# Patient Record
Sex: Male | Born: 1940
Health system: Southern US, Community
[De-identification: ages and names within clinical notes are randomized; demographics above are authoritative.]

## PROBLEM LIST (undated history)

## (undated) DIAGNOSIS — J189 Pneumonia, unspecified organism: Secondary | ICD-10-CM

## (undated) DIAGNOSIS — I219 Acute myocardial infarction, unspecified: Secondary | ICD-10-CM

## (undated) DIAGNOSIS — K635 Polyp of colon: Secondary | ICD-10-CM

## (undated) DIAGNOSIS — D649 Anemia, unspecified: Secondary | ICD-10-CM

## (undated) DIAGNOSIS — C4370 Malignant melanoma of unspecified lower limb, including hip: Secondary | ICD-10-CM

## (undated) DIAGNOSIS — I5022 Chronic systolic (congestive) heart failure: Secondary | ICD-10-CM

## (undated) DIAGNOSIS — K8020 Calculus of gallbladder without cholecystitis without obstruction: Secondary | ICD-10-CM

## (undated) DIAGNOSIS — H53451 Other localized visual field defect, right eye: Secondary | ICD-10-CM

## (undated) DIAGNOSIS — R3911 Hesitancy of micturition: Secondary | ICD-10-CM

## (undated) DIAGNOSIS — I4819 Other persistent atrial fibrillation: Secondary | ICD-10-CM

## (undated) DIAGNOSIS — D126 Benign neoplasm of colon, unspecified: Secondary | ICD-10-CM

## (undated) DIAGNOSIS — E785 Hyperlipidemia, unspecified: Secondary | ICD-10-CM

## (undated) DIAGNOSIS — K5790 Diverticulosis of intestine, part unspecified, without perforation or abscess without bleeding: Secondary | ICD-10-CM

## (undated) DIAGNOSIS — N401 Enlarged prostate with lower urinary tract symptoms: Secondary | ICD-10-CM

## (undated) DIAGNOSIS — M1990 Unspecified osteoarthritis, unspecified site: Secondary | ICD-10-CM

## (undated) DIAGNOSIS — I499 Cardiac arrhythmia, unspecified: Secondary | ICD-10-CM

## (undated) DIAGNOSIS — I7090 Unspecified atherosclerosis: Secondary | ICD-10-CM

## (undated) DIAGNOSIS — H269 Unspecified cataract: Secondary | ICD-10-CM

## (undated) DIAGNOSIS — G4733 Obstructive sleep apnea (adult) (pediatric): Secondary | ICD-10-CM

## (undated) DIAGNOSIS — K219 Gastro-esophageal reflux disease without esophagitis: Secondary | ICD-10-CM

## (undated) DIAGNOSIS — H409 Unspecified glaucoma: Secondary | ICD-10-CM

## (undated) DIAGNOSIS — C6990 Malignant neoplasm of unspecified site of unspecified eye: Secondary | ICD-10-CM

## (undated) DIAGNOSIS — F329 Major depressive disorder, single episode, unspecified: Secondary | ICD-10-CM

## (undated) DIAGNOSIS — I10 Essential (primary) hypertension: Secondary | ICD-10-CM

## (undated) DIAGNOSIS — F32A Depression, unspecified: Secondary | ICD-10-CM

## (undated) DIAGNOSIS — Z95810 Presence of automatic (implantable) cardiac defibrillator: Secondary | ICD-10-CM

## (undated) HISTORY — PX: BACK SURGERY: SHX140

## (undated) HISTORY — DX: Polyp of colon: K63.5

## (undated) HISTORY — DX: Unspecified atherosclerosis: I70.90

## (undated) HISTORY — DX: Diverticulosis of intestine, part unspecified, without perforation or abscess without bleeding: K57.90

## (undated) HISTORY — PX: JOINT REPLACEMENT: SHX530

## (undated) HISTORY — DX: Obstructive sleep apnea (adult) (pediatric): G47.33

## (undated) HISTORY — DX: Hyperlipidemia, unspecified: E78.5

## (undated) HISTORY — PX: REFRACTIVE SURGERY: SHX103

## (undated) HISTORY — PX: SURGERY SCROTAL / TESTICULAR: SUR1316

## (undated) HISTORY — PX: CATARACT EXTRACTION: SUR2

## (undated) HISTORY — DX: Calculus of gallbladder without cholecystitis without obstruction: K80.20

## (undated) HISTORY — PX: GLAUCOMA SURGERY: SHX656

## (undated) HISTORY — DX: Essential (primary) hypertension: I10

## (undated) HISTORY — DX: Unspecified osteoarthritis, unspecified site: M19.90

## (undated) HISTORY — DX: Anemia, unspecified: D64.9

## (undated) HISTORY — DX: Other persistent atrial fibrillation: I48.19

## (undated) HISTORY — DX: Benign neoplasm of colon, unspecified: D12.6

---

## 1996-10-01 DIAGNOSIS — I219 Acute myocardial infarction, unspecified: Secondary | ICD-10-CM

## 1996-10-01 HISTORY — DX: Acute myocardial infarction, unspecified: I21.9

## 1996-10-01 HISTORY — PX: CARDIAC CATHETERIZATION: SHX172

## 1999-06-02 DIAGNOSIS — C6990 Malignant neoplasm of unspecified site of unspecified eye: Secondary | ICD-10-CM

## 1999-06-02 HISTORY — DX: Malignant neoplasm of unspecified site of unspecified eye: C69.90

## 2000-03-31 HISTORY — PX: THORACIC SPINE SURGERY: SHX802

## 2000-04-08 ENCOUNTER — Encounter: Payer: Self-pay | Admitting: Neurosurgery

## 2000-04-08 ENCOUNTER — Inpatient Hospital Stay (HOSPITAL_COMMUNITY): Admission: RE | Admit: 2000-04-08 | Discharge: 2000-04-11 | Payer: Self-pay | Admitting: Neurosurgery

## 2000-04-23 ENCOUNTER — Encounter: Admission: RE | Admit: 2000-04-23 | Discharge: 2000-07-22 | Payer: Self-pay | Admitting: Neurosurgery

## 2003-07-06 ENCOUNTER — Ambulatory Visit (HOSPITAL_COMMUNITY): Admission: RE | Admit: 2003-07-06 | Discharge: 2003-07-06 | Payer: Self-pay | Admitting: Unknown Physician Specialty

## 2003-07-06 ENCOUNTER — Encounter: Payer: Self-pay | Admitting: Unknown Physician Specialty

## 2004-01-04 ENCOUNTER — Ambulatory Visit (HOSPITAL_COMMUNITY): Admission: RE | Admit: 2004-01-04 | Discharge: 2004-01-04 | Payer: Self-pay | Admitting: Unknown Physician Specialty

## 2004-10-01 DIAGNOSIS — H53451 Other localized visual field defect, right eye: Secondary | ICD-10-CM

## 2004-10-01 HISTORY — DX: Other localized visual field defect, right eye: H53.451

## 2004-11-16 ENCOUNTER — Ambulatory Visit: Payer: Self-pay | Admitting: Family Medicine

## 2004-12-18 ENCOUNTER — Ambulatory Visit: Payer: Self-pay | Admitting: Internal Medicine

## 2004-12-25 ENCOUNTER — Ambulatory Visit: Payer: Self-pay | Admitting: Internal Medicine

## 2005-02-19 ENCOUNTER — Ambulatory Visit: Payer: Self-pay | Admitting: Family Medicine

## 2005-04-09 ENCOUNTER — Ambulatory Visit: Payer: Self-pay | Admitting: Family Medicine

## 2005-04-30 ENCOUNTER — Ambulatory Visit: Payer: Self-pay | Admitting: Family Medicine

## 2005-06-18 ENCOUNTER — Ambulatory Visit: Payer: Self-pay | Admitting: Family Medicine

## 2005-09-19 ENCOUNTER — Ambulatory Visit: Payer: Self-pay | Admitting: Family Medicine

## 2005-12-26 ENCOUNTER — Ambulatory Visit: Payer: Self-pay | Admitting: Family Medicine

## 2006-01-28 ENCOUNTER — Encounter: Admission: RE | Admit: 2006-01-28 | Discharge: 2006-01-28 | Payer: Self-pay | Admitting: Sports Medicine

## 2006-04-23 ENCOUNTER — Encounter: Admission: RE | Admit: 2006-04-23 | Discharge: 2006-04-23 | Payer: Self-pay | Admitting: Sports Medicine

## 2006-05-02 ENCOUNTER — Ambulatory Visit: Payer: Self-pay | Admitting: Family Medicine

## 2006-08-05 ENCOUNTER — Ambulatory Visit: Payer: Self-pay | Admitting: Family Medicine

## 2006-08-14 ENCOUNTER — Encounter: Admission: RE | Admit: 2006-08-14 | Discharge: 2006-08-14 | Payer: Self-pay | Admitting: Sports Medicine

## 2006-10-28 ENCOUNTER — Ambulatory Visit: Payer: Self-pay | Admitting: Family Medicine

## 2006-12-31 ENCOUNTER — Encounter: Admission: RE | Admit: 2006-12-31 | Discharge: 2006-12-31 | Payer: Self-pay | Admitting: Sports Medicine

## 2007-02-26 ENCOUNTER — Ambulatory Visit: Payer: Self-pay | Admitting: Family Medicine

## 2007-04-14 ENCOUNTER — Encounter: Admission: RE | Admit: 2007-04-14 | Discharge: 2007-04-14 | Payer: Self-pay | Admitting: Sports Medicine

## 2007-05-06 ENCOUNTER — Ambulatory Visit: Payer: Self-pay | Admitting: Cardiology

## 2007-05-14 ENCOUNTER — Ambulatory Visit: Payer: Self-pay

## 2007-05-22 ENCOUNTER — Ambulatory Visit: Payer: Self-pay

## 2007-05-22 ENCOUNTER — Encounter: Payer: Self-pay | Admitting: Cardiology

## 2007-06-02 HISTORY — PX: TOTAL HIP ARTHROPLASTY: SHX124

## 2007-06-23 ENCOUNTER — Inpatient Hospital Stay (HOSPITAL_COMMUNITY): Admission: RE | Admit: 2007-06-23 | Discharge: 2007-06-27 | Payer: Self-pay | Admitting: Orthopedic Surgery

## 2007-06-23 ENCOUNTER — Encounter (INDEPENDENT_AMBULATORY_CARE_PROVIDER_SITE_OTHER): Payer: Self-pay | Admitting: Orthopedic Surgery

## 2007-07-23 ENCOUNTER — Encounter: Admission: RE | Admit: 2007-07-23 | Discharge: 2007-09-29 | Payer: Self-pay | Admitting: Orthopedic Surgery

## 2007-07-29 ENCOUNTER — Ambulatory Visit: Payer: Self-pay | Admitting: Cardiology

## 2009-11-25 ENCOUNTER — Encounter (INDEPENDENT_AMBULATORY_CARE_PROVIDER_SITE_OTHER): Payer: Self-pay | Admitting: *Deleted

## 2009-12-08 ENCOUNTER — Encounter (INDEPENDENT_AMBULATORY_CARE_PROVIDER_SITE_OTHER): Payer: Self-pay | Admitting: *Deleted

## 2009-12-29 ENCOUNTER — Encounter (INDEPENDENT_AMBULATORY_CARE_PROVIDER_SITE_OTHER): Payer: Self-pay | Admitting: *Deleted

## 2010-01-02 ENCOUNTER — Ambulatory Visit: Payer: Self-pay | Admitting: Internal Medicine

## 2010-01-16 ENCOUNTER — Ambulatory Visit: Payer: Self-pay | Admitting: Internal Medicine

## 2010-01-21 ENCOUNTER — Encounter: Payer: Self-pay | Admitting: Internal Medicine

## 2010-10-31 NOTE — Letter (Signed)
Summary: [facility] Instructions  [facility]  [place], [place] [address]   Phone: [phone]  Fax: [phone]       Chad Brown    [birth year]    MRN: [id number]        Procedure Day /Date:  Monday     Arrival Time:  9 AM      Procedure Time: 10 AM     Location of Procedure:                    _X_  [place] (4th Floor)                      [place]   Starting 5 days prior to your procedure _Wednesday 4/13/11_ do not eat nuts, seeds, popcorn, corn, beans, peas,  salads, or any raw vegetables.  Do not take any fiber supplements (e.g. Metamucil, Citrucel, and Benefiber).  THE DAY BEFORE YOUR PROCEDURE         DATE: _4/17/11_  DAY: _Sunday_  1.  Drink clear liquids the entire day-NO SOLID FOOD  2.  Do not drink anything colored red or purple.  Avoid juices with pulp.  No orange juice.  3.  Drink at least 64 oz. (8 glasses) of fluid/clear liquids during the day to prevent dehydration and help the prep work efficiently.  CLEAR LIQUIDS INCLUDE: Water Jello Ice Popsicles Tea (sugar ok, no milk/cream) Powdered fruit flavored drinks Coffee (sugar ok, no milk/cream) Gatorade Juice: apple, white grape, white cranberry  Lemonade Clear bullion, consomm, broth Carbonated beverages (any kind) Strained chicken noodle soup Hard Candy                             4.  In the morning, mix first dose of MoviPrep solution:    Empty 1 Pouch A and 1 Pouch B into the disposable container    Add lukewarm drinking water to the top line of the container. Mix to dissolve    Refrigerate (mixed solution should be used within 24 hrs)  5.  Begin drinking the prep at 5:00 p.m. The MoviPrep container is divided by 4 marks.   Every 15 minutes drink the solution down to the next mark (approximately 8 oz) until the full liter is complete.   6.  Follow completed prep with 16 oz of clear liquid of your choice (Nothing  red or purple).  Continue to drink clear liquids until bedtime.  7.  Before going to bed, mix second dose of MoviPrep solution:    Empty 1 Pouch A and 1 Pouch B into the disposable container    Add lukewarm drinking water to the top line of the container. Mix to dissolve    Refrigerate  THE DAY OF YOUR PROCEDURE      DATE: _4/18/11_ DAY: Adelfa Koh  Beginning at 5 a.m. (5 hours before procedure):         1. Every 15 minutes, drink the solution down to the next mark (approx 8 oz) until the full liter is complete.  2. Follow completed prep with 16 oz. of clear liquid of your choice.    3. You may drink clear liquids until 8 AM (2 HOURS BEFORE PROCEDURE).   MEDICATION INSTRUCTIONS  Unless otherwise instructed, you should take regular prescription medications with a small sip of water   as early as possible the morning of  your procedure.    Additional medication instructions: donot take Microzide day of procedure.         OTHER INSTRUCTIONS  You will need a responsible adult at least 70 years of age to accompany you and drive you home.   This person must remain in the waiting room during your procedure.  Wear loose fitting clothing that is easily removed.  Leave jewelry and other valuables at home.  However, you may wish to bring a book to read or  an iPod/MP3 player to listen to music as you wait for your procedure to start.  Remove all body piercing jewelry and leave at home.  Total time from sign-in until discharge is approximately 2-3 hours.  You should go home directly after your procedure and rest.  You can resume normal activities the  day after your procedure.  The day of your procedure you should not:   Drive   Make legal decisions   Operate machinery   Drink alcohol   Return to work  You will receive specific instructions about eating, activities and medications before you leave.    The above instructions have been reviewed and explained to me by   Ulice Dash RN  January 02, 2010 9:20 AM    I fully understand and can verbalize these instructions _____________________________ Date _________

## 2010-10-31 NOTE — Letter (Signed)
Summary: Previsit letter  [facility]  [place], [place] [address]   Phone: [phone]  Fax: [phone]       12/08/2009 MRN: [id number]  Chad Brown [id number] [place], [place]  [address]  Dear Mr. [name],  Welcome to the Gastroenterology Division at [facility].    You are scheduled to see a nurse for your pre-procedure visit on 01-02-10 at 9:00a.m. on the 3rd floor at [facility], [place] [employer].  We ask that you try to arrive at our office 15 minutes prior to your appointment time to allow for check-in.  Your nurse visit will consist of discussing your medical and surgical history, your immediate family medical history, and your medications.    Please bring a complete list of all your medications or, if you prefer, bring the medication bottles and we will list them.  We will need to be aware of both prescribed and over the counter drugs.  We will need to know exact dosage information as well.  If you are on blood thinners (Coumadin, Plavix, Aggrenox, Ticlid, etc.) please call our office today/prior to your appointment, as we need to consult with your physician about holding your medication.   Please be prepared to read and sign documents such as consent forms, a financial agreement, and acknowledgement forms.  If necessary, and with your consent, a friend or relative is welcome to sit-in on the nurse visit with you.  Please bring your insurance card so that we may make a copy of it.  If your insurance requires a referral to see a specialist, please bring your referral form from your primary care physician.  No co-pay is required for this nurse visit.     If you cannot keep your appointment, please call [phone] to cancel or reschedule prior to your appointment date.  This allows [place] the opportunity to schedule an appointment for another patient in need of care.    Thank you for choosing [facility] for your medical needs.  We  appreciate the opportunity to care for you.  Please visit [place] at our website  to learn more about our practice.                     Sincerely.                                                                                                                   The Gastroenterology Division

## 2010-10-31 NOTE — Letter (Signed)
Summary: Colonoscopy Letter  [facility]  [place], [place] [address]   Phone: [phone]  Fax: [phone]      November 25, 2009 MRN: [id number]   Chad Brown [id number] [facility] [address], [place]  [address]   Dear Mr. ENEA,   According to your medical record, it is time for you to schedule a Colonoscopy. The American Cancer Society recommends this procedure as a method to detect early colon cancer. Patients with a family history of colon cancer, or a personal history of colon polyps or inflammatory bowel disease are at increased risk.  This letter has beeen generated based on the recommendations made at the time of your procedure. If you feel that in your particular situation this may no longer apply, please contact our office.  Please call our office at [phone] to schedule this appointment or to update your records at your earliest convenience.  Thank you for cooperating with [place] to provide you with the very best care possible.   Sincerely,  Gatha Mayer, M.D.  [facility] Gastroenterology Division [phone]

## 2010-10-31 NOTE — Letter (Signed)
Summary: Patient Notice- Polyp Results  [facility]  [address] [place], [place] [address]   Phone: [phone]  Fax: [phone]        January 21, 2010 MRN: [id number]    Chad Brown [place], [place]  [address]    Dear [name],  I am pleased to inform you that the colon polyp(s) removed during your recent colonoscopy was (were) found to be benign (no cancer detected) upon pathologic examination. (one was recovered and one removed but not recovered)  I recommend you have a repeat colonoscopy examination in 5 years to look for recurrent polyps, as having colon polyps increases your risk for having recurrent polyps or even colon cancer in the future.  Should you develop new or worsening symptoms of abdominal pain, bowel habit changes or bleeding from the rectum or bowels, please schedule an evaluation with either your primary care physician or with me. In addition to repeating colonoscopy, changing health habits may reduce your risk of having more colon polyps and possibly, colon cancer. You may lower your risk of future polyps and colon cancer by adopting healthy habits such as not smoking or using tobacco (if you do), being physically active, losing weight (if overweight), and eating a diet which includes fruits and vegetables and limits red meat.  Please call us if you are having persistent problems or have questions about your condition that have not been fully answered at this time.  Sincerely,  Gatha Mayer MD, [facility]  This letter has been electronically signed by your physician.  Appended Document: Patient Notice- Polyp Results letter mailed 4.25.11

## 2010-10-31 NOTE — Procedures (Signed)
Summary: Colonoscopy  Patient: Chad Brown Note: All result statuses are Final unless otherwise noted.  Tests: (1) Colonoscopy (COL)   COL Colonoscopy           [facility] [employer] [facility] [employer]     [name],   [address]           COLONOSCOPY PROCEDURE REPORT           PATIENT:  [name], [name]  MR#:  [id number]     BIRTHDATE:  1941/08/05, 69 yrs. old  GENDER:  male     ENDOSCOPIST:  Gatha Mayer, MD, [facility]           PROCEDURE DATE:  01/16/2010     PROCEDURE:  Colonoscopy with snare polypectomy     ASA CLASS:  Class II     INDICATIONS:  surveillance and high-risk screening, history of     pre-cancerous (adenomatous) colon polyps 5 mm adenoma removed 3/06           MEDICATIONS:   Fentanyl 50 mcg IV, Versed 8 mg IV           DESCRIPTION OF PROCEDURE:   After the risks benefits and     alternatives of the procedure were thoroughly explained, informed     consent was obtained.  Digital rectal exam was performed and     revealed an enlarged prostate and no rectal masses.  Smooth,     mildly enlarged prostate. The LB CF-[id number] endoscope was     introduced through the anus and advanced to the cecum, which was     identified by both the appendix and ileocecal valve, without     limitations.  The quality of the prep was good, using MoviPrep.     The instrument was then slowly withdrawn as the colon was fully     examined. Insertion: 4:58 minutes Withdrawal; 16:14 minutes     <<PROCEDUREIMAGES>>           FINDINGS:  Two polyps were found in the ascending colon. They were     7 - 8 mm in size. Polyps were snared without cautery. Retrieval     was successful for one but not the other.  Severe diverticulosis     was found in the sigmoid colon.  This was otherwise a normal     examination of the colon.   Retroflexed views in the rectum     revealed no abnormalities.    The scope was then withdrawn from     the patient and the procedure completed.           COMPLICATIONS:  None     ENDOSCOPIC IMPRESSION:     1) Two 7-8 mm polyps removed in the ascending colon, 1 not     recovered     2) Severe diverticulosis in the sigmoid colon     3) Otherwise normal examination, good prep           4) Personal history of 5 mm adenoma removal 11/2004           REPEAT EXAM:  In for Colonoscopy, pending biopsy results.           Gatha Mayer, MD, [facility]           CC:  The Patient     Dione Housekeeper, MD           n.  eSIGNED:   Gatha Mayer at 01/16/2010 11:21 AM           Glenna Durand, [id number]  Note: An exclamation mark (!) indicates a result that was not dispersed into the flowsheet. Document Creation Date: 01/16/2010 11:21 AM _______________________________________________________________________  (1) Order result status: Final Collection or observation date-time: 01/16/2010 11:11 Requested date-time:  Receipt date-time:  Reported date-time:  Referring Physician:   Ordering Physician: Silvano Rusk [phone]) Specimen Source:  Source: Tawanna Cooler Order Number: [id number] Lab site:   Appended Document: Colonoscopy: hyperplastic polyp, 1 not recovered, diverticulosis   Colonoscopy  Procedure date:  01/16/2010  Findings:       1) Two 7-8 mm polyps removed in the ascending colon, 1 not     recovered PATH = HYPERPLASTIC on recovered polyp     2) Severe diverticulosis in the sigmoid colon     3) Otherwise normal examination, good prep           4) Personal history of 5 mm adenoma removal 11/2004  Comments:      Repeat colonoscopy in 5 years.   Procedures Next Due Date:    Colonoscopy: 01/2015   Appended Document: Colonoscopy     Procedures Next Due Date:    Colonoscopy: 01/2015

## 2010-10-31 NOTE — Miscellaneous (Signed)
Summary: [facility]  Clinical Lists Changes  Medications: Added new medication of MOVIPREP 100 GM  SOLR (PEG-KCL-NACL-NASULF-NA ASC-C) As per prep instructions. - Signed Rx of MOVIPREP 100 GM  SOLR (PEG-KCL-NACL-NASULF-NA ASC-C) As per prep instructions.;  #1 x 0;  Signed;  Entered by: Ulice Dash RN;  Authorized by: Gatha Mayer MD, [facility];  Method used: Print then Give to Patient Observations: Added new observation of NKA: T (01/02/2010 8:22)    Prescriptions: MOVIPREP 100 GM  SOLR (PEG-KCL-NACL-NASULF-NA ASC-C) As per prep instructions.  #1 x 0   Entered by:   Ulice Dash RN   Authorized by:   Gatha Mayer MD, [facility]   Signed by:   Ulice Dash RN on 01/02/2010   Method used:   Print then Give to Patient   RxID:   [id number]

## 2011-02-13 NOTE — Consult Note (Signed)
NAMEMORDCHA, BURDETT NO.:  [id number]   MEDICAL RECORD NO.:  [id number]          PATIENT TYPE:  INP   LOCATION:  5031                         FACILITY:  [facility]   PHYSICIAN:  Lucina Mellow. Terance Hart, M.D.DATE OF BIRTH:  1940/12/28   DATE OF CONSULTATION:  06/25/2007  DATE OF DISCHARGE:                                 CONSULTATION   I was asked to see this gentleman who is postop hip replacement.  He has  had a history of bladder outlet obstructive symptoms.  Has been on  Flomax per his primary care physician, and despite that has had some  problems with weak flow and nocturia.  He denies any history of urinary  tract infections or retention other than possibly after one other  surgical procedure.  He had a Foley catheter postop, and it was removed  yesterday.  He failed a voiding trial.  He had a couple of in-and-out  catheterizations, and then a Foley catheter was reinserted this  afternoon and connected to closed drainage and urine has been clear.  He  has been continued on his Flomax.  At this point he is not terribly  ambulatory.  He is supposed to be transferred to the nursing care unit  at [facility] at the end of this week.   PAST MEDICAL HISTORY:  End-stage degenerative joint disease of his right  hip.  He has had personal history of coronary artery disease with an MI,  hypertension, and glaucoma.   FAMILY HISTORY:  There is a family history of cardiovascular disease and  diabetes.   MEDICATIONS:  Norvasc, Neurontin, Microzide, K-Dur, Flomax, 81 mg  aspirin, glucosamine, Darvocet, and Aleve.   ALLERGIES:  Allergies to drugs are denied.   He is married and quit smoking in 2002 and rarely drinks alcohol.   Currently denies any nausea, vomiting, indigestion, chest pain,  shortness of breath, diarrhea.  He is complaining about hip pain  postoperatively.   PHYSICAL EXAMINATION:  VITAL SIGNS:  Blood pressure is 136/66, pulse 94.  He is afebrile.  GENERAL:  He is alert and oriented.  SKIN:  Warm and dry.  EXTREMITIES:  He has a leg brace on his right leg.  LUNGS:  No respiratory distress.  ABDOMEN:  Soft, nontender.  No hepatosplenomegaly.  No hernias, no  inguinal nodes.  GENITORECTAL:  Penis, urethral meatus, scrotum, testicles, and  epididymis remarkable for Foley catheter in place.  Perineum is normal,  sphincter tone is normal, prostate is hard to reach, could not palpate  seminal vesicles.  He says he gets annual PSA's that have been normal.   IMPRESSION:  1. Bladder neck contracture of longstanding origin.  2. Acute urinary retention postoperatively.   At this point I will recommend to keep his Foley catheter in place for  the time being.  He needs to continue on his Flomax.  Hopefully he will  be able to void better once he gets more ambulatory.  It may be  reasonable for him to maintain his Foley catheter drainage in the next  few days and undergo voiding trials when he  is at his nursing home  facility.  I will follow this gentleman between now and his discharge.      Lucina Mellow. Terance Hart, M.D.  Electronically Signed     LJP/MEDQ  D:  06/25/2007  T:  06/26/2007  Job:  [id number]   cc:   Herbie Baltimore A. Noemi Chapel, M.D.

## 2011-02-13 NOTE — Discharge Summary (Signed)
NAMESUMMER, HAENEL NO.:  [id number]   MEDICAL RECORD NO.:  [id number]          PATIENT TYPE:  INP   LOCATION:  5031                         FACILITY:  [facility]   PHYSICIAN:  Audree Camel. Noemi Chapel, M.D. DATE OF BIRTH:  01/14/1941   DATE OF ADMISSION:  06/23/2007  DATE OF DISCHARGE:  06/25/2007                               DISCHARGE SUMMARY   ADMITTING DIAGNOSES:  1. End-stage degenerative joint disease, right hip.  2. History of myocardial infarction.  3. Hypertension.  4. Cataracts.  5. Glaucoma.   DISCHARGE DIAGNOSES:  1. End-stage degenerative joint disease, right hip, status post total      hip replacement.  2. Hyponatremia, resolved.  3. Hypokalemia, still symptomatic.  4. Postoperative blood loss anemia.  5. Long term use of anticoagulants for postoperative hip replacement.  6. Lumbosacral degenerative disk disease with right leg radiculopathy.  7. Urinary retention with bladder neck narrowing.  Urinary retention      is acute.  Bladder neck narrowing is chronic.  8. History of myocardial infarction.  9. Hypertension.  10.Cataracts.  11.Glaucoma.   HISTORY OF PRESENT ILLNESS:  The patient is 70 year old white male with  a history of end-stage DJD of his right hip.  He failed conservative  care including anti-inflammatories and intra-articular hip injections.  The risks, benefits and possible complications of a right total hip were  discussed with him and his wife.  They understood these and were without  question.   PROCEDURE IN-HOUSE:  On June 23, 2007, the patient underwent a  right total hip replacement.  A Foley catheter was inserted  preoperatively in the operating room under general anesthesia without  difficulty.  Postoperatively, he was admitted for pain control, DVT  prophylaxis and physical therapy.   On postop day 1, the patient was alert and oriented.  He did complain of  some numbness in his right leg, similar to his radicular pain  and  numbness that he has had in the past.  Upon repositioning, his numbness  improved.  The patient was afebrile.  His hemoglobin was 10.7.  His INR  was 1.1.  He was metabolically stable.  He had a small amount of  drainage from his surgical wound.  He was neurovascularly intact with a  2+ dorsalis pedis pulse and active EHL, FHL, posterior tib and anterior  tib.  He was given Milk of Magnesia.  Physical therapy was ordered, 50%  weightbearing.  Home health was ordered.  His PCA was discontinued and  his Foley was discontinued.  He was given a 500 mL bolus of normal  saline and his IV was saline locked.   On postop day 2, the patient was once again alert and oriented.  His  numbness was significantly improved.  No bowel movement from the Milk of  Magnesia, although passing gas.  His abdomen was soft with positive  bowel sounds, but was distended.  His hemoglobin was 10.4, white cell  count was 10.7.  His sodium was 134, potassium was 3.4 and his INR was  1.5.  He was afebrile.  He was 2+  max assist with physical therapy,  unable to get out of bed by himself without two assistants and max  ambulation was 5 feet.  He was given a Dulcolax suppository and had in-  and-out cath x2 with approximately 500 mL of urine expressed on each in-  and-out cath.  He was still unable to void on the third trial and a  Foley catheter was placed and Urology was consulted.  Urology doubled  his Flomax dose and recommended that his Foley catheter be left in and  that voiding trials be done once he has become more ambulatory at his  skilled nursing facility.   On postop day 3, the patient was improved.  He had a T-max of 101.1.  White cell count was declined at 9.7.  His hemoglobin was stable at  10.1.  He was metabolically stable today with no abnormalities.  He  still had not had a bowel movement.  Fleets Phospho-Soda was ordered and  administered with good results.  His dressing was changed.  He had a   small amount of serosanguineous drainage.  The surgical wounds were well  approximated and healed.  The wound above the surgical wounds where a  skin tag was removed was also well approximated.  He had no redness and  no excess drainage.  He had minimal swelling.  No ecchymosis.  He was  neurovascularly intact.  He ambulation improved on 06/26/2007 and was  able to ambulate 90 feet, but still with 2+ max assist to get out of the  bed or out of a chair.   On postop day 4 in the morning, the patient was alert and oriented.  He  had a T-max of 100.5 overnight.  Temperature on exam was 99.4.  His  white cell count continued to decline at 9.4.  His hemoglobin was stable  at 9.5.  His INR was rising slowing at 1.6. His sodium was normal at  140.  His potassium was low at 3.2.  Otherwise, he is metabolically  stable.  The urine culture from his urinary retention revealed no  growth.   He is being discharged to a skilled nursing facility, 75% weightbearing  on his right lower extremity.  He has been instructed that when he is in  bed or in a chair to keep a pillow between his legs.  He is on posterior  total hip precautions.  The knee immobilizer was giving him pressure  sores on the proximal aspect of his leg.  He will need his heels floated  to prevent pressure sores on his right heel.  He will need his dressing  changed every other day.  He will need physical therapy daily and  occupational therapy.  He will need his Coumadin monitored to maintain  his INR between 2.0 and 3.0.  He will be on Coumadin until July 21, 2007.   DISCHARGE MEDICATIONS:  1. OxyIR 5 mg one to two every four to six hours p.r.n. pain.  2. Tylenol 650 mg every four hours, scheduled.  3. Robaxin 500 mg one p.o. every four to six hours p.r.n. muscle      spasm.  4. Coumadin 10 mg p.o. daily.  He will need a recheck of his PT/INR      and to be adjusted by skilled nursing facility medical physicians.  5. Norvasc 10  mg daily.  6. Neurontin 100 mg b.i.d.  7. Neurontin 300 mg p.o. q.h.s.  8. Microzide 12.5 mg daily.  9. K-Dur 20 mEq daily.  10.Flomax 0.4 mg two tablets daily.  11.Aspirin 81 mg daily.  12.Cosopt one drop in right eye b.i.d.  13.Alphagan one drop in right eye b.i.d.  14.Lumigan one drop in right eye q.h.s.  15.Clear Eyes one drop in left eye b.i.d.  16.Colace 100 mg one p.o. b.i.d.  17.Senokot-S two tablets with dinner if no BM in two days.  His last      BM was 06/26/2007.   FOLLOWUP:  He will need to followup with Dr. Noemi Chapel on Monday, July 07, 2007, for x-rays and a wound check.  Our office number is [phone].  Please call to make an appointment.   Please all if increased redness, increased swelling, increased drainage  about his right hip wounds or a temperature greater than 101.5.   CONDITION ON DISCHARGE:  He is in stable condition, 75% weightbearing  and currently on a regular high-fiber diet.      Kirstin Shepperson, P.A.      Robert A. Noemi Chapel, M.D.  Electronically Signed    KS/MEDQ  D:  06/27/2007  T:  06/27/2007  Job:  [id number]

## 2011-02-13 NOTE — Op Note (Signed)
Chad Brown, Chad Brown NO.:  [id number]   MEDICAL RECORD NO.:  [id number]          PATIENT TYPE:  INP   LOCATION:  2854                         FACILITY:  [facility]   PHYSICIAN:  Audree Camel. Noemi Chapel, M.D. DATE OF BIRTH:  [birth year]   DATE OF PROCEDURE:  06/23/2007  DATE OF DISCHARGE:                               OPERATIVE REPORT   PREOPERATIVE DIAGNOSIS:  Right hip degenerative joint disease.   POSTOPERATIVE DIAGNOSIS:  Right hip degenerative joint disease.   PROCEDURE:  Right total hip replacement using S-ROM Press-Fit total hip  system with acetabulum, 50 mm ASR Press-Fit cup with femoral component  18 x 13 femoral stem with 36 +8 femoral neck with +0 x 45 mm ASR head  with 64F large sleeve.   SURGEON:  Audree Camel. Noemi Chapel, M.D.   ASSISTANT:  Matthew Saras, P.A.   ANESTHESIA:  General.   OPERATIVE TIME:  1 hour 30 minutes.   ESTIMATED BLOOD LOSS:  350 mL.   COMPLICATIONS:  None.   DESCRIPTION OF PROCEDURE:  The patient is brought to the operating room  on June 23, 2007, placed on the operative table in supine position.  After an adequate level of general anesthesia was obtained, he received  Ancef 2 grams IV preoperatively for prophylaxis.  He had a Foley  catheter placed under sterile conditions.  His right hip was examined  under anesthesia.  He had flexion to 80, extension to 0, internal  rotation of -20, external rotation of 30.  Leg lengths were  approximately equal.  After being placed under general anesthesia, he  was then turned in the right lateral decubitus position and secured on  the bed with Mark frame.  His right hip and leg was prepped using a  sterile DuraPrep and draped using sterile technique.  Originally,  through a 15 cm posterolateral greater trochanteric incision initial  exposure was made.  The underlying subcutaneous tissues were incised  along with skin incision.  The iliotibial band and gluteus maximus  fascia was incised  longitudinally revealing the sciatic nerve which was  carefully protected.  The short external rotators of the hip and hip  capsule were released off their femoral neck insertion intact.  The hip  was then posteriorly dislocated.  Femoral neck cut was then made 2 cm  above the lesser trochanter and the femoral head was removed.  A showed  significant grade 3 and 4 DJD.  Degenerative acetabular labrum was then  removed and carefully placed acetabular retractors were placed.  The  acetabulum showed grade 3 and 4 DJD as well.  Sequential acetabular  reaming was then carried out in the appropriate amount of anteversion,  abduction and inclination up to a 49 mm size and then a 50 mm acetabular  trial was placed and found to be an excellent fit.  It was then removed  and the actual 50 mm ASR cup was hammered into position in the  appropriate manner anteversion, abduction and inclination with an  excellent press fit.  The proximal femur was then exposed.  Sequential  reaming distally to a 13.5  size was performed and then an 71F large  calcar sleeve reaming was carried out and this gave an excellent fit as  well.  After this was done, then the femoral trial with the trial sleeve  was placed with a 36 +8 femoral neck offset and a +0 x 45 mm trial head.  The hip was then reduced, taken through a full range of motion, found to  be stable, leg lengths equal.  At this point, the femoral trials were  felt to be of excellent size, fit and stability.  They were then  removed.  The femoral canal was then irrigated with saline and then the  actual 71F sleeve component was hammered into position with an excellent  fit and then the femoral component, 18 x 13 femoral stem with a 36 +8  femoral neck was hammered into position.  He had 20 degrees of  anteversion with an excellent fit and then the +0 x 45 mm ASR head was  placed on the femoral neck and hammered into position with an excellent  Morse taper fit.   The hip was then reduced, taken through range of  motion, found to be stable up to 80 degrees of internal rotation in both  neutral and 30 degrees of adduction, also stable in abduction and  external rotation.  Leg lengths were found to be equalized as well.  At  this point, it is felt that all the components were of excellent size,  fit and stability.  After this was done, then the wound was further  irrigated.  The short external rotators of the hip and hip capsule were  reattached to their femoral neck insertion through two drill holes in  the greater trochanter.  The iliotibial band gluteus maximus fascia was  closed with a #1 Ethibond suture.  Subcutaneous tissues closed with 0  and 2-0 Vicryl, subcuticular layer closed with 4-0 Monocryl.  The  patient also had a skin tag just anterior to the hip incision and this  was removed at its base and sent for pathologic review, although it  looked completely benign.  At this point, sterile dressings and a knee  immobilizer was placed.  The patient was turned supine, checked for leg  lengths equal, rotation equal, pulses 2+ and symmetric.  He was then  awakened, extubated and taken to the recovery room in stable condition.  Needle and sponge counts correct x2 at the end of the case.      Robert A. Noemi Chapel, M.D.  Electronically Signed     RAW/MEDQ  D:  06/23/2007  T:  06/23/2007  Job:  [id number]

## 2011-02-13 NOTE — Assessment & Plan Note (Signed)
[place]                            CARDIOLOGY OFFICE NOTE   ARIV, GOTTE                     MRN:          [id number]  DATE:07/29/2007                            DOB:          [birth year]    [name] is in for a followup visit.  He is generally doing well.  He  underwent a hip replacement.  He had a long hospitalization due to a  number of things none of which were cardiac.  His myocardial perfusion  scan was unremarkable.  His initial presentation was with possible spasm  in 1998.  At that time he did not have critical coronary disease.  We  thought he might have some spasms so therefore he has been on Norvasc.  He has not been on a beta blocker as a result of this.  In general he is  feeling relatively well.  Today blood pressure 140/84, pulse 54.  Lung  fields are clear, and the cardiac rhythm is regular.   His most recent LDL cholesterol was elevated at 162, and he is scheduled  to see Dr. Edrick Oh for followup of this.   Since he is doing so well we will not see him for a year in followup.  I  have encouraged him to keep his appointment with  Dr. Edrick Oh so adjustments can be made.     [name]. Lia Foyer, MD, [facility]  Electronically Signed    TDS/MedQ  DD: 07/29/2007  DT: 07/30/2007  Job #: [id number]   cc:   Margarita Rana, M.D.

## 2011-02-13 NOTE — Assessment & Plan Note (Signed)
[place] OFFICE NOTE   Brown, Chad                     MRN:          [id number]  DATE:05/06/2007                            DOB:          1940/10/10    REFERRING PHYSICIAN:  Audree Camel. Noemi Chapel, M.D.   CHIEF COMPLAINT:  It has been nearly 10 years since I last saw Mr.  Chad Brown.   Our last office visit was in June 1998.  He had a moderate LVH with  relaxation abnormality and noncompliance with ejection fraction that was  normal at that time.  He was on an ACE inhibitor and had a cough.  He  was followed by Dr. Trellis Moment.  He has been scheduled for surgery by  Dr. Colon Flattery or bladder Noemi Chapel.  He is scheduled to have a total hip  replacement.  The patient underwent previous cardiac evaluation.  He had  LVH.  He had very mild posterolateral hypokinesis and a completely  normal coronary arteries at that time.  When he was in the hospital at  that time, he did have elevated cardiac enzymes with a CPK of 31 and a  troponin I of 13.1.  He was subsequently discharged on Norvasc and Imdur  and we questioned whether this might have been related to vasospasm.  He  has had no recurrent episodes of discomfort in the interim.  An  echocardiogram did reveal evidence of moderate left ventricular  hypertrophy at that time with an ejection fraction of 55% and very mild  mitral regurgitation.   In the interim, he has done relatively well.  He had back surgery by Dr.  Saintclair Halsted in 2001, but otherwise no significant problems.  He does have  hypertension and hypercholesterolemia, but has not had recurrent  episodes of chest pain and no exertional symptoms during the interim.   He has no known drug allergies.   MEDICATIONS:  1. Pravachol 80 mg daily.  2. Fish oil 360 mg daily.  3. Glucosamine 500 mg p.o. b.i.d.  4. Aleve 220 mg daily.  5. Aspirin 81 mg daily.  6. Norvasc 10 mg daily.  7. Neurontin 200 mg in the morning  and 300 mg in the p.m.  8. Microzide 12.5 mg daily.  9. K-Dur 20 mEq daily.  10.CoQ10 100 mg daily b.i.d.  11.Cosopt, right eye b.i.d.  12.Alphagan right eye b.i.d.  13.Lumigan right eye b.i.d.   PAST MEDICAL HISTORY:  Remarkable for:  1. Hypertension.  2. Hypercholesterolemia.   SOCIAL HISTORY:  The patient quit smoking.  He plays golf.  He is  married and has 3 children.  He quit smoking 6 years ago.   FAMILY HISTORY:  Father died at 74 of acute leukemia.  Mother died at 29  and had diabetes and prior open heart surgery.  He has 3 siblings who  are all alive in their 77s and they do have hypercholesterolemia.   REVIEW OF SYSTEMS:  Remarkable for dentures.  He had a cough previously  but not presently.  He does have some reflux at times.  He has nocturia  at times.  His weight varies from 165 to 235.   PHYSICAL EXAMINATION:  VITAL SIGNS:  Currently today blood pressure is  139/79 in the right arm and 138/76 in the left arm.  The weight is 223  pounds.  The pulse is 60.  HEENT:  Reveals extraocular muscles intact.  His pupils move  symmetrically.  NECK:  Supple.  There are no carotid bruits noted.  LUNGS:  Fields are clear to auscultation and percussion.  CARDIAC:  Rhythm is regular without a definite murmur.  ABDOMEN:  Soft without hepatosplenomegaly.  EXTREMITIES:  Reveal no edema.   EKG reveals normal sinus rhythm with left axis deviation and borderline  interventricular conduction delay.   IMPRESSION:  1. Prior non-ST elevation wall myocardial infarction with no      significant coronary obstruction.  2. History of hypertension.  3. Hypercholesterolemia.  4. History of left ventricular hypertrophy with repolarization      abnormalities.   PLAN:  The patient should be okay for surgery.  He has not had  intercurrent symptoms.  We will go ahead and do an adenosine Myoview  study to assess for myocardial ischemia.  Should this be stable, then he  would be stable for  surgery.     [name]. Lia Foyer, MD, [facility]  Electronically Signed    TDS/MedQ  DD: 05/19/2007  DT: 05/19/2007  Job #: [id number]
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
## 2011-07-12 LAB — BASIC METABOLIC PANEL
BUN: 10
BUN: 12
BUN: 15
CO2: 31
CO2: 31
Calcium: 7.5 — ABNORMAL LOW
Chloride: 100
Chloride: 101
Creatinine, Ser: 0.87
Creatinine, Ser: 0.87
Creatinine, Ser: 0.91
GFR calc Af Amer: >60
GFR calc Af Amer: >60
GFR calc Af Amer: >60
GFR calc non Af Amer: >60
Glucose, Bld: 111 — ABNORMAL HIGH
Glucose, Bld: 113 — ABNORMAL HIGH
Glucose, Bld: 129 — ABNORMAL HIGH
Glucose, Bld: 137 — ABNORMAL HIGH
Potassium: 3.4 — ABNORMAL LOW
Sodium: 134 — ABNORMAL LOW
Sodium: 143

## 2011-07-12 LAB — URINALYSIS, ROUTINE W REFLEX MICROSCOPIC
Bilirubin Urine: NEGATIVE
Hgb urine dipstick: NEGATIVE
Ketones, ur: 40 — AB
Ketones, ur: NEGATIVE
Leukocytes, UA: NEGATIVE
Nitrite: NEGATIVE
Protein, ur: 30 — AB
Protein, ur: NEGATIVE
Urobilinogen, UA: 1
pH: 6.5

## 2011-07-12 LAB — CBC
Hemoglobin: 13.8
MCHC: 33.9
MCHC: 33.9
MCHC: 33.9
MCHC: 34.1
MCV: 85.7
MCV: 86.1
Platelets: 141 — ABNORMAL LOW
Platelets: 144 — ABNORMAL LOW
Platelets: 161
RBC: 3.26 — ABNORMAL LOW
RBC: 3.48 — ABNORMAL LOW
RDW: 15.6 — ABNORMAL HIGH
RDW: 16 — ABNORMAL HIGH
WBC: 10.7 — ABNORMAL HIGH
WBC: 8.9

## 2011-07-12 LAB — URINE MICROSCOPIC-ADD ON

## 2011-07-12 LAB — PROTIME-INR
INR: 1
INR: 1.1
INR: 1.5
Prothrombin Time: 18.5 — ABNORMAL HIGH
Prothrombin Time: 20.2 — ABNORMAL HIGH

## 2011-07-12 LAB — COMPREHENSIVE METABOLIC PANEL
AST: 21
Albumin: 3.9
CO2: 30
Calcium: 9.5
Chloride: 103
GFR calc non Af Amer: >60
Sodium: 143
Total Bilirubin: 0.6
Total Protein: 7.2

## 2011-07-12 LAB — DIFFERENTIAL
Monocytes Absolute: 0.8 — ABNORMAL HIGH
Neutro Abs: 5.1
Neutrophils Relative %: 58

## 2011-07-12 LAB — ABO/RH: ABO/RH(D): O POS

## 2011-07-12 LAB — URINE CULTURE
Colony Count: 40000
Special Requests: NEGATIVE

## 2011-07-12 LAB — APTT: aPTT: 32

## 2011-07-12 LAB — TYPE AND SCREEN: ABO/RH(D): O POS

## 2012-05-01 HISTORY — PX: INCISION AND DRAINAGE ABSCESS POSTERIOR CERVICALSPINE: SHX1793

## 2012-05-15 ENCOUNTER — Ambulatory Visit (INDEPENDENT_AMBULATORY_CARE_PROVIDER_SITE_OTHER): Payer: Medicare Other | Admitting: Surgery

## 2012-05-15 ENCOUNTER — Encounter (INDEPENDENT_AMBULATORY_CARE_PROVIDER_SITE_OTHER): Payer: Self-pay | Admitting: Surgery

## 2012-05-15 VITALS — BP 136/80 | HR 60 | Temp 98.6°F | Resp 16 | Ht 68.0 in | Wt 220.4 lb

## 2012-05-15 DIAGNOSIS — L089 Local infection of the skin and subcutaneous tissue, unspecified: Secondary | ICD-10-CM

## 2012-05-15 DIAGNOSIS — L723 Sebaceous cyst: Secondary | ICD-10-CM

## 2012-05-15 NOTE — Patient Instructions (Signed)
Neosporin/ dry gauze over wound daily until healed.  Follow up in 1-2 weeks.

## 2012-05-15 NOTE — Progress Notes (Signed)
Patient ID: ALGIE BIBB, male   DOB: Aug 28, 1941, 71 y.o.   MRN: [id number]  Chief Complaint  Patient presents with  . Other    absc on back of neck    HPI AFRAZ HULA is a 70 y.o. male.  Referred by Dr. Edrick Oh for evaluation of back abscess HPI This is a 71 year old male who had previously undergone incision and drainage of a infected sebaceous cyst by Dr. Redmond Pulling. He now has a new area in the midline of the upper back just below the base of the neck that has become swollen, erythematous, with some purulent drainage. It is quite tender. He denies any fever. He presents now to urgent office for evaluation. Past Medical History  Diagnosis Date  . Arthritis   . Hyperlipidemia   . Hypertension     Past Surgical History  Procedure Date  . Joint replacement     hip  . Eye surgery     glaucoma & cataract    Family History  Problem Relation Age of Onset  . Kidney disease Mother   . Heart disease Mother   . Diabetes Mother   . Cancer Father     leukemia    Social History History  Substance Use Topics  . Smoking status: Former Smoker    Quit date: 09/28/2000  . Smokeless tobacco: Never Used  . Alcohol Use: No    Not on File  Current Outpatient Prescriptions  Medication Sig Dispense Refill  . aspirin 81 MG tablet Take 81 mg by mouth daily.      [name] gabapentin (NEURONTIN) 100 MG capsule       . hydrochlorothiazide (MICROZIDE) 12.5 MG capsule       . meloxicam (MOBIC) 15 MG tablet       . NIASPAN 500 MG CR tablet       . potassium chloride SA (K-DUR,KLOR-CON) 20 MEQ tablet       . Tamsulosin HCl (FLOMAX) 0.4 MG CAPS         Review of Systems Review of Systems  Blood pressure 136/80, pulse 60, temperature 98.6 F (37 C), temperature source Temporal, resp. rate 16, height 5\' 8"  (1.727 m), weight 220 lb 6.4 oz (99.973 kg).  Physical Exam Physical Exam WDWN in NAD Skin - upper back near the base of the neck - midline - 3 cm area of erythema with 2 cm central  protrusion, fluctuance, and tenderness.  Data Reviewed None  Assessment    Infected sebaceous cyst - upper back    Plan    Incision and drainage of infected sebaceous cyst of the upper back. This area was prepped with Betadine and anesthetized with 1% lidocaine. I made a 2 cm transverse incision. Dissection was carried out on abscess. We evacuated a large amount of infected sebum. The tract to dissect some of the wall of the cyst away but there is too much inflammation. We debrided some of the cyst wall that we could see. The wound is packed with quarter-inch Nu Gauze. The patient will remove this and treat this area with Neosporin and dry gauze. Followup in 2-3 weeks. He may need further excision if the cyst reaccumulates.       Mamadou Breon K. 05/15/2012, 5:41 PM

## 2012-05-23 ENCOUNTER — Encounter (INDEPENDENT_AMBULATORY_CARE_PROVIDER_SITE_OTHER): Payer: Self-pay | Admitting: Surgery

## 2012-05-23 ENCOUNTER — Ambulatory Visit (INDEPENDENT_AMBULATORY_CARE_PROVIDER_SITE_OTHER): Payer: Medicare Other | Admitting: Surgery

## 2012-05-23 VITALS — BP 124/72 | HR 68 | Temp 97.2°F | Resp 12 | Ht 68.0 in | Wt 224.4 lb

## 2012-05-23 DIAGNOSIS — L723 Sebaceous cyst: Secondary | ICD-10-CM

## 2012-05-23 NOTE — Progress Notes (Signed)
  The infected sebaceous cyst is improved.  No more drainage.  I would like to reevaluate him in one month to see if the cyst has begun to reaccumulate.    Imogene Burn. Georgette Dover, MD, [facility]  05/23/2012 10:53 AM

## 2012-06-19 ENCOUNTER — Encounter (INDEPENDENT_AMBULATORY_CARE_PROVIDER_SITE_OTHER): Payer: Self-pay | Admitting: General Surgery

## 2012-06-19 ENCOUNTER — Ambulatory Visit (INDEPENDENT_AMBULATORY_CARE_PROVIDER_SITE_OTHER): Payer: Medicare Other | Admitting: General Surgery

## 2012-06-19 VITALS — BP 142/76 | HR 60 | Temp 98.5°F | Resp 20 | Ht 67.5 in | Wt 222.0 lb

## 2012-06-19 DIAGNOSIS — L723 Sebaceous cyst: Secondary | ICD-10-CM

## 2012-06-19 DIAGNOSIS — L089 Local infection of the skin and subcutaneous tissue, unspecified: Secondary | ICD-10-CM

## 2012-06-19 NOTE — Patient Instructions (Signed)
Apply triple antibiotic ointment daily and cover with gauze. Call if area worsens.

## 2012-06-20 NOTE — Progress Notes (Signed)
Subjective:     Patient ID: Chad Brown, male   DOB: [birth year], 71 y.o.   MRN: [id number]  HPI This is a 71 year old gentleman who recently had an infected posterior neck sebaceous cyst drained by Dr. Georgette Dover on August 15. He came back in on August 19 for recheck. He comes in today because of concerns of reinfection. They noticed some swelling and redness. He denies any fevers or chills. He denies any drainage from the area Review of Systems     Objective:   Physical Exam BP 142/76  Pulse 60  Temp 98.5 F (36.9 C) (Temporal)  Resp 20  Ht 5' 7.5" (1.715 m)  Wt 222 lb (100.699 kg)  BMI 34.26 kg/m2 Alert, no apparent distress Skin-base of posterior neck reveals a central punctum there is no surrounding cellulitis, induration or fluctuance. He has a circumferential blister around the punctum site    Assessment:     Healing sebaceous cyst    Plan:     There is no signs of current infection. I believe he is simply formed a blister around the skin edges. I do not see need for antibiotic. We discussed local wound care. He was instructed to keep his appt with Dr. Georgette Dover to discuss possible formal cyst excision  Leighton Ruff. Redmond Pulling, MD, FACS General, Bariatric, & Minimally Invasive Surgery [facility], [place]

## 2012-06-27 ENCOUNTER — Encounter (INDEPENDENT_AMBULATORY_CARE_PROVIDER_SITE_OTHER): Payer: Medicare Other | Admitting: Surgery

## 2012-07-03 ENCOUNTER — Ambulatory Visit (INDEPENDENT_AMBULATORY_CARE_PROVIDER_SITE_OTHER): Payer: Medicare Other | Admitting: Surgery

## 2012-07-03 ENCOUNTER — Encounter (INDEPENDENT_AMBULATORY_CARE_PROVIDER_SITE_OTHER): Payer: Self-pay | Admitting: Surgery

## 2012-07-03 VITALS — BP 130/82 | HR 60 | Temp 97.1°F | Resp 20 | Ht 68.0 in | Wt 222.0 lb

## 2012-07-03 DIAGNOSIS — L723 Sebaceous cyst: Secondary | ICD-10-CM

## 2012-07-03 NOTE — Progress Notes (Signed)
Status post incision and drainage of an infected sebaceous cyst on his posterior neck about 2 months ago. The wound seems to be healing well. There is a tiny scab over this area. No drainage. No surrounding erythema. I cannot really palpate a cyst. At this point I would not recommend excision. We will await a few more weeks and if cyst retiform so we will consider excision. I instructed the patient to call my nurse and for 4 weeks if this mass persists and we can schedule a time to excise it here in the office.  Imogene Burn. Georgette Dover, MD, [facility]  07/03/2012 9:33 AM

## 2012-07-03 NOTE — Patient Instructions (Signed)
If the cyst remains or if it continues to drain, then we should consider removing it.  Call my nurse Deneise Lever at [phone] to schedule an appointment to have this removed here in the office.

## 2012-11-18 DIAGNOSIS — H409 Unspecified glaucoma: Secondary | ICD-10-CM | POA: Insufficient documentation

## 2012-12-29 ENCOUNTER — Ambulatory Visit (INDEPENDENT_AMBULATORY_CARE_PROVIDER_SITE_OTHER): Payer: 59 | Admitting: Physician Assistant

## 2012-12-29 DIAGNOSIS — J209 Acute bronchitis, unspecified: Secondary | ICD-10-CM

## 2012-12-29 DIAGNOSIS — J45901 Unspecified asthma with (acute) exacerbation: Secondary | ICD-10-CM

## 2012-12-29 MED ORDER — AZITHROMYCIN 250 MG PO TABS: ORAL_TABLET | ORAL | Status: DC

## 2012-12-29 MED ORDER — ALBUTEROL SULFATE HFA 108 (90 BASE) MCG/ACT IN AERS: 2.0000 | INHALATION_SPRAY | Freq: Four times a day (QID) | RESPIRATORY_TRACT | Status: DC | PRN

## 2012-12-29 MED ORDER — HYDROCODONE-HOMATROPINE 5-1.5 MG/5ML PO SYRP: 5.0000 mL | ORAL_SOLUTION | Freq: Every evening | ORAL | Status: DC | PRN

## 2012-12-29 MED ORDER — PREDNISONE 10 MG PO TABS: 10.0000 mg | ORAL_TABLET | Freq: Two times a day (BID) | ORAL | Status: DC

## 2012-12-29 NOTE — Progress Notes (Signed)
  Subjective:    Patient ID: Chad Brown, male    DOB: [birth year], 72 y.o.   MRN: [id number]  HPI cough that prevents sleep, causing shortness of breath over the past 3 days    Review of Systems  Constitutional: Positive for fever and chills.  HENT: Positive for postnasal drip.   Respiratory: Positive for cough, choking, chest tightness, shortness of breath and wheezing.   All other systems reviewed and are negative.       Objective:   Physical Exam  Constitutional: He is oriented to person, place, and time. He appears well-developed and well-nourished.  HENT:  Head: Normocephalic and atraumatic.  Right Ear: External ear normal.  Left Ear: External ear normal.  Eyes: Conjunctivae and EOM are normal. Pupils are equal, round, and reactive to light.  Neck: Normal range of motion. Neck supple.  Cardiovascular: Normal rate, regular rhythm and normal heart sounds.   Pulmonary/Chest: Effort normal. No respiratory distress. He has wheezes.  Neurological: He is alert and oriented to person, place, and time.  Skin: Skin is warm and dry.  Psychiatric: He has a normal mood and affect. His behavior is normal. Judgment and thought content normal.          Assessment & Plan:  Asthma with acute exacerbation - Plan: albuterol (PROVENTIL HFA;VENTOLIN HFA) 108 (90 BASE) MCG/ACT inhaler, predniSONE (DELTASONE) 10 MG tablet, HYDROcodone-homatropine (HYCODAN) 5-1.5 MG/5ML syrup  Acute bronchitis - Plan: azithromycin (ZITHROMAX) 250 MG tablet, HYDROcodone-homatropine (HYCODAN) 5-1.5 MG/5ML syrup

## 2013-01-02 ENCOUNTER — Telehealth: Payer: Self-pay | Admitting: Physician Assistant

## 2013-01-02 ENCOUNTER — Other Ambulatory Visit: Payer: Self-pay | Admitting: Physician Assistant

## 2013-01-02 DIAGNOSIS — J209 Acute bronchitis, unspecified: Secondary | ICD-10-CM

## 2013-01-02 MED ORDER — AZITHROMYCIN 250 MG PO TABS: ORAL_TABLET | ORAL | Status: DC

## 2013-01-02 NOTE — Telephone Encounter (Signed)
Antibiotic authorized and sent to pharmacy

## 2013-01-05 NOTE — Telephone Encounter (Signed)
Pt was given abx Friday 01/02/13

## 2013-01-22 ENCOUNTER — Encounter: Payer: Self-pay | Admitting: *Deleted

## 2013-01-22 ENCOUNTER — Encounter: Payer: Self-pay | Admitting: Physician Assistant

## 2013-01-22 ENCOUNTER — Other Ambulatory Visit: Payer: Self-pay | Admitting: Cardiovascular Disease

## 2013-01-22 ENCOUNTER — Encounter: Payer: Self-pay | Admitting: Cardiovascular Disease

## 2013-01-22 ENCOUNTER — Ambulatory Visit (INDEPENDENT_AMBULATORY_CARE_PROVIDER_SITE_OTHER): Payer: Medicare Other | Admitting: Cardiovascular Disease

## 2013-01-22 ENCOUNTER — Inpatient Hospital Stay (HOSPITAL_COMMUNITY)
Admission: AD | Admit: 2013-01-22 | Discharge: 2013-01-27 | DRG: 308 | Disposition: A | Discharge status: Home / Self Care | Payer: Medicare Other | Source: Ambulatory Visit | Attending: Cardiovascular Disease | Admitting: Cardiovascular Disease

## 2013-01-22 ENCOUNTER — Inpatient Hospital Stay (HOSPITAL_COMMUNITY): Payer: Medicare Other

## 2013-01-22 VITALS — BP 141/102 | HR 125 | Wt 237.0 lb

## 2013-01-22 DIAGNOSIS — I10 Essential (primary) hypertension: Secondary | ICD-10-CM | POA: Insufficient documentation

## 2013-01-22 DIAGNOSIS — G8929 Other chronic pain: Secondary | ICD-10-CM | POA: Diagnosis present

## 2013-01-22 DIAGNOSIS — Z87891 Personal history of nicotine dependence: Secondary | ICD-10-CM

## 2013-01-22 DIAGNOSIS — E785 Hyperlipidemia, unspecified: Secondary | ICD-10-CM | POA: Insufficient documentation

## 2013-01-22 DIAGNOSIS — Z7901 Long term (current) use of anticoagulants: Secondary | ICD-10-CM

## 2013-01-22 DIAGNOSIS — Z79899 Other long term (current) drug therapy: Secondary | ICD-10-CM

## 2013-01-22 DIAGNOSIS — I509 Heart failure, unspecified: Secondary | ICD-10-CM | POA: Diagnosis present

## 2013-01-22 DIAGNOSIS — I4891 Unspecified atrial fibrillation: Principal | ICD-10-CM | POA: Diagnosis present

## 2013-01-22 DIAGNOSIS — E669 Obesity, unspecified: Secondary | ICD-10-CM | POA: Diagnosis present

## 2013-01-22 DIAGNOSIS — I426 Alcoholic cardiomyopathy: Secondary | ICD-10-CM | POA: Diagnosis present

## 2013-01-22 DIAGNOSIS — Z8249 Family history of ischemic heart disease and other diseases of the circulatory system: Secondary | ICD-10-CM

## 2013-01-22 DIAGNOSIS — Z806 Family history of leukemia: Secondary | ICD-10-CM

## 2013-01-22 DIAGNOSIS — M549 Dorsalgia, unspecified: Secondary | ICD-10-CM | POA: Diagnosis present

## 2013-01-22 DIAGNOSIS — Z6836 Body mass index (BMI) 36.0-36.9, adult: Secondary | ICD-10-CM

## 2013-01-22 DIAGNOSIS — I5021 Acute systolic (congestive) heart failure: Secondary | ICD-10-CM | POA: Diagnosis present

## 2013-01-22 DIAGNOSIS — M1990 Unspecified osteoarthritis, unspecified site: Secondary | ICD-10-CM | POA: Insufficient documentation

## 2013-01-22 DIAGNOSIS — I428 Other cardiomyopathies: Secondary | ICD-10-CM | POA: Diagnosis present

## 2013-01-22 DIAGNOSIS — I252 Old myocardial infarction: Secondary | ICD-10-CM

## 2013-01-22 DIAGNOSIS — I059 Rheumatic mitral valve disease, unspecified: Secondary | ICD-10-CM | POA: Diagnosis present

## 2013-01-22 HISTORY — DX: Malignant neoplasm of unspecified site of unspecified eye: C69.90

## 2013-01-22 HISTORY — DX: Acute myocardial infarction, unspecified: I21.9

## 2013-01-22 LAB — COMPREHENSIVE METABOLIC PANEL
ALT: 30 U/L (ref 0–53)
Alkaline Phosphatase: 70 U/L (ref 39–117)
BUN: 12 mg/dL (ref 6–23)
CO2: 31 mEq/L (ref 19–32)
Calcium: 9.1 mg/dL (ref 8.4–10.5)
GFR calc Af Amer: >90 mL/min (ref >90)
GFR calc non Af Amer: 80 mL/min — ABNORMAL LOW (ref >90)
Glucose, Bld: 120 mg/dL — ABNORMAL HIGH (ref 70–99)
Potassium: 3.5 mEq/L (ref 3.5–5.1)
Sodium: 142 mEq/L (ref 135–145)
Total Protein: 7.1 g/dL (ref 6.0–8.3)

## 2013-01-22 LAB — CBC WITH DIFFERENTIAL/PLATELET
Eosinophils Absolute: 0.2 10*3/uL (ref 0.0–0.7)
Eosinophils Relative: 3 % (ref 0–5)
Hemoglobin: 14 g/dL (ref 13.0–17.0)
Lymphocytes Relative: 21 % (ref 12–46)
Lymphs Abs: 1.3 10*3/uL (ref 0.7–4.0)
MCH: 28.8 pg (ref 26.0–34.0)
MCV: 85.2 fL (ref 78.0–100.0)
Monocytes Relative: 9 % (ref 3–12)
Platelets: 147 10*3/uL — ABNORMAL LOW (ref 150–400)
RBC: 4.86 MIL/uL (ref 4.22–5.81)
WBC: 6.3 10*3/uL (ref 4.0–10.5)

## 2013-01-22 LAB — APTT: aPTT: 31 seconds (ref 24–37)

## 2013-01-22 LAB — MAGNESIUM: Magnesium: 2.1 mg/dL (ref 1.5–2.5)

## 2013-01-22 MED ORDER — ACETAMINOPHEN 325 MG PO TABS: 650.0000 mg | ORAL_TABLET | ORAL | Status: DC | PRN

## 2013-01-22 MED ORDER — ONDANSETRON HCL 4 MG/2ML IJ SOLN: 4.0000 mg | Freq: Four times a day (QID) | INTRAMUSCULAR | Status: DC | PRN

## 2013-01-22 MED ORDER — RIVAROXABAN 20 MG PO TABS
20.0000 mg | ORAL_TABLET | Freq: Every day | ORAL | Status: DC
  Administered 2013-01-22 – 2013-01-26 (×5): 20 mg via ORAL
  Filled 2013-01-22 (×6): qty 1

## 2013-01-22 MED ORDER — SODIUM CHLORIDE 0.9 % IJ SOLN
3.0000 mL | Freq: Two times a day (BID) | INTRAMUSCULAR | Status: DC
  Administered 2013-01-23 – 2013-01-27 (×7): 3 mL via INTRAVENOUS

## 2013-01-22 MED ORDER — DIGOXIN 0.25 MG/ML IJ SOLN
0.2500 mg | Freq: Once | INTRAMUSCULAR | Status: AC
  Administered 2013-01-22: 0.25 mg via INTRAVENOUS
  Filled 2013-01-22: qty 1

## 2013-01-22 MED ORDER — SODIUM CHLORIDE 0.9 % IJ SOLN: 3.0000 mL | INTRAMUSCULAR | Status: DC | PRN

## 2013-01-22 MED ORDER — POTASSIUM CHLORIDE CRYS ER 20 MEQ PO TBCR
20.0000 meq | EXTENDED_RELEASE_TABLET | Freq: Two times a day (BID) | ORAL | Status: DC
  Administered 2013-01-22 – 2013-01-27 (×10): 20 meq via ORAL
  Filled 2013-01-22 (×13): qty 1

## 2013-01-22 MED ORDER — SODIUM CHLORIDE 0.9 % IV SOLN
250.0000 mL | INTRAVENOUS | Status: DC | PRN
  Administered 2013-01-26: 15:00:00 via INTRAVENOUS

## 2013-01-22 MED ORDER — ASPIRIN EC 81 MG PO TBEC
81.0000 mg | DELAYED_RELEASE_TABLET | Freq: Every day | ORAL | Status: DC
  Administered 2013-01-22 – 2013-01-27 (×6): 81 mg via ORAL
  Filled 2013-01-22 (×6): qty 1

## 2013-01-22 MED ORDER — FUROSEMIDE 10 MG/ML IJ SOLN
INTRAMUSCULAR | Status: AC
  Administered 2013-01-22: 20 mg via INTRAVENOUS
  Filled 2013-01-22: qty 4

## 2013-01-22 MED ORDER — DIGOXIN 0.25 MG/ML IJ SOLN
0.1250 mg | Freq: Once | INTRAMUSCULAR | Status: AC
  Administered 2013-01-23: 0.125 mg via INTRAVENOUS
  Filled 2013-01-22: qty 0.5

## 2013-01-22 MED ORDER — DIGOXIN 125 MCG PO TABS
0.1250 mg | ORAL_TABLET | Freq: Every day | ORAL | Status: DC
  Administered 2013-01-24 – 2013-01-25 (×2): 0.125 mg via ORAL
  Filled 2013-01-22 (×3): qty 1

## 2013-01-22 MED ORDER — DILTIAZEM HCL 25 MG/5ML IV SOLN
10.0000 mg | Freq: Once | INTRAVENOUS | Status: AC
  Administered 2013-01-22: 10 mg via INTRAVENOUS
  Filled 2013-01-22: qty 5

## 2013-01-22 MED ORDER — LEVALBUTEROL HCL 0.63 MG/3ML IN NEBU: 0.6300 mg | INHALATION_SOLUTION | Freq: Four times a day (QID) | RESPIRATORY_TRACT | Status: DC | PRN

## 2013-01-22 MED ORDER — DEXTROSE 5 % IV SOLN
5.0000 mg/h | INTRAVENOUS | Status: DC
  Administered 2013-01-22: 5 mg/h via INTRAVENOUS
  Administered 2013-01-22 – 2013-01-24 (×4): 15 mg/h via INTRAVENOUS
  Filled 2013-01-22 (×5): qty 100

## 2013-01-22 MED ORDER — FUROSEMIDE 10 MG/ML IJ SOLN: 20.0000 mg | Freq: Two times a day (BID) | INTRAMUSCULAR | Status: DC

## 2013-01-22 MED ORDER — FUROSEMIDE 10 MG/ML IJ SOLN
40.0000 mg | Freq: Two times a day (BID) | INTRAMUSCULAR | Status: DC
  Administered 2013-01-23 – 2013-01-24 (×3): 40 mg via INTRAVENOUS
  Filled 2013-01-22 (×5): qty 4

## 2013-01-22 MED ORDER — CARVEDILOL 6.25 MG PO TABS
6.2500 mg | ORAL_TABLET | Freq: Two times a day (BID) | ORAL | Status: DC
Start: 2013-01-22 — End: 2013-01-27
  Administered 2013-01-22 – 2013-01-27 (×10): 6.25 mg via ORAL
  Filled 2013-01-22 (×12): qty 1

## 2013-01-22 NOTE — Progress Notes (Signed)
Patient admitted to 3034.  Patient placed on monitor, HR=120-140's (AFIB/RVR per Centralized Telemetry).  Patient SOB.  2 L O2 Drew applied.   I spoke to Ignacia Bayley, NP, he entered new orders for Diltiazem IV.  Will continue to monitor.

## 2013-01-22 NOTE — Progress Notes (Signed)
Patient ID: Chad Brown, male   DOB: 10-10-1940, 72 y.o.   MRN: [id number] 72 yo patient of Dr Lia Foyer  Describes MI back in 94 Rx with medicine.  Has been sick since 3/29 with cough and dyspnea. Seen by multiple MD;s in [place] and given multiple antibiotics. No CXR done. Noted increased pulse PND and orthopnea No chest pain.  Takes diuretic for HTN  No fever. Last few days has been restless Cannot get comfortable or sleep due to dyspnea.  In office today was in rapid atrial fibrillation with signs of CHF  He indicates weight is up about 15 lbs over the last month with some abdominal distension.    ROS: Denies fever, malais, weight loss, blurry vision, decreased visual acuity, cough, sputum, SOB, hemoptysis, pleuritic pain, palpitaitons, heartburn, abdominal pain, melena, lower extremity edema, claudication, or rash.  All other systems reviewed and negative   General: Affect appropriate Dyspnic obese white male HEENT: normal Neck supple with no adenopathy JVP elevated  no bruits no thyromegaly Lungs crackles at base  no wheezing and good diaphragmatic motion Heart:  S1/S2 no murmur,rub, gallop or click PMI normal Abdomen: benighn, BS positve, no tenderness, no AAA no bruit.  No HSM or HJR Distal pulses intact with no bruits Plus 2 bilateral  edema Neuro non-focal Skin warm and dry No muscular weakness  Medications Current Outpatient Prescriptions  Medication Sig Dispense Refill  . albuterol (PROVENTIL HFA;VENTOLIN HFA) 108 (90 BASE) MCG/ACT inhaler Inhale 2 puffs into the lungs every 6 (six) hours as needed for wheezing.  1 Inhaler  0  . amLODipine (NORVASC) 10 MG tablet Take 10 mg by mouth daily.      [name] aspirin 81 MG tablet Take 81 mg by mouth daily.      [name] azithromycin (ZITHROMAX) 250 MG tablet Use as directed  6 tablet  0  . gabapentin (NEURONTIN) 100 MG capsule 2 tam am 3 tab pm      . hydrochlorothiazide (MICROZIDE) 12.5 MG capsule Take 12.5 mg by mouth daily.       [name]  loratadine (CLARITIN) 10 MG tablet Take 10 mg by mouth daily.      . meloxicam (MOBIC) 15 MG tablet Take 15 mg by mouth daily.       [name] NIASPAN 500 MG CR tablet Take 500 mg by mouth at bedtime.       . Pitavastatin Calcium (LIVALO) 2 MG TABS Take 2 mg by mouth daily.      . potassium chloride SA (K-DUR,KLOR-CON) 20 MEQ tablet 20 mEq daily.       . Tamsulosin HCl (FLOMAX) 0.4 MG CAPS 0.4 mg by Per post-pyloric tube route daily.        No current facility-administered medications for this visit.    Allergies Review of patient's allergies indicates no known allergies.  Family History: Family History  Problem Relation Age of Onset  . Kidney disease Mother   . Heart disease Mother   . Diabetes Mother   . Cancer Father     leukemia    Social History: History   Social History  . Marital Status: Married    Spouse Name: N/A    Number of Children: N/A  . Years of Education: N/A   Occupational History  . Not on file.   Social History Main Topics  . Smoking status: Former Smoker    Quit date: 09/28/2000  . Smokeless tobacco: Never Used  . Alcohol Use: No  . Drug Use:  No  . Sexually Active: Not on file   Other Topics Concern  . Not on file   Social History Narrative  . No narrative on file    Electrocardiogram:  Rapid afib rate 132 low voltage septal infarct  Assessment and Plan Afib:  Send to hospital.  Start iv cardizem and transition to PO meds once we know EF.  Start xarelto today  Echo to assess atrial sizes CHF:  Has signs and symptoms of CHF  CXR  BNP.  D/C HCTZ and start lasix.  Assess EF with echo  Discussed transfer to hospital with patient and wife.  He is relieved to not go home as he feels so poorly and wants to get better.

## 2013-01-22 NOTE — H&P (Signed)
Patient ID: Chad Brown, male DOB: 21-May-1941, 72 y.o. MRN: [id number]  72 yo patient of Dr Lia Foyer Describes MI back in 35 Rx with medicine. Has been sick since 3/29 with cough and dyspnea. Seen by multiple MD;s in [place] and given multiple antibiotics. No CXR done. Noted increased pulse PND and orthopnea No chest pain. Takes diuretic for HTN No fever. Last few days has been restless Cannot get comfortable or sleep due to dyspnea. In office today was in rapid atrial fibrillation with signs of CHF He indicates weight is up about 15 lbs over the last month with some abdominal distension.  ROS: Denies fever, malais, weight loss, blurry vision, decreased visual acuity, cough, sputum, SOB, hemoptysis, pleuritic pain, palpitaitons, heartburn, abdominal pain, melena, lower extremity edema, claudication, or rash. All other systems reviewed and negative  General:  Affect appropriate  Dyspnic obese white male  HEENT: normal  Neck supple with no adenopathy  JVP elevated no bruits no thyromegaly  Lungs crackles at base no wheezing and good diaphragmatic motion  Heart: S1/S2 no murmur,rub, gallop or click  PMI normal  Abdomen: benighn, BS positve, no tenderness, no AAA  no bruit. No HSM or HJR  Distal pulses intact with no bruits  Plus 2 bilateral edema  Neuro non-focal  Skin warm and dry  No muscular weakness  Medications  Current Outpatient Prescriptions   Medication  Sig  Dispense  Refill   .  albuterol (PROVENTIL HFA;VENTOLIN HFA) 108 (90 BASE) MCG/ACT inhaler  Inhale 2 puffs into the lungs every 6 (six) hours as needed for wheezing.  1 Inhaler  0   .  amLODipine (NORVASC) 10 MG tablet  Take 10 mg by mouth daily.     [name]  aspirin 81 MG tablet  Take 81 mg by mouth daily.     [name]  azithromycin (ZITHROMAX) 250 MG tablet  Use as directed  6 tablet  0   .  gabapentin (NEURONTIN) 100 MG capsule  2 tam am 3 tab pm     .  hydrochlorothiazide (MICROZIDE) 12.5 MG capsule  Take 12.5 mg by mouth daily.      [name]  loratadine (CLARITIN) 10 MG tablet  Take 10 mg by mouth daily.     .  meloxicam (MOBIC) 15 MG tablet  Take 15 mg by mouth daily.     [name]  NIASPAN 500 MG CR tablet  Take 500 mg by mouth at bedtime.     .  Pitavastatin Calcium (LIVALO) 2 MG TABS  Take 2 mg by mouth daily.     .  potassium chloride SA (K-DUR,KLOR-CON) 20 MEQ tablet  20 mEq daily.     .  Tamsulosin HCl (FLOMAX) 0.4 MG CAPS  0.4 mg by Per post-pyloric tube route daily.      No current facility-administered medications for this visit.   Allergies  Review of patient's allergies indicates no known allergies.  Family History:  Family History   Problem  Relation  Age of Onset   .  Kidney disease  Mother    .  Heart disease  Mother    .  Diabetes  Mother    .  Cancer  Father      leukemia   Social History:  History    Social History   .  Marital Status:  Married     Spouse Name:  N/A     Number of Children:  N/A   .  Years of Education:  N/A  Occupational History   .  Not on file.    Social History Main Topics   .  Smoking status:  Former Smoker     Quit date:  09/28/2000   .  Smokeless tobacco:  Never Used   .  Alcohol Use:  No   .  Drug Use:  No   .  Sexually Active:  Not on file    Other Topics  Concern   .  Not on file    Social History Narrative   .  No narrative on file   Electrocardiogram: Rapid afib rate 132 low voltage septal infarct  Assessment and Plan  Afib: Send to hospital. Start iv cardizem and transition to PO meds once we know EF. Start xarelto today Echo to assess atrial sizes  CHF: Has signs and symptoms of CHF CXR BNP. D/C HCTZ and start lasix. Assess EF with echo  Discussed transfer to hospital with patient and wife. He is relieved to not go home as he feels so poorly and wants to get better.

## 2013-01-23 DIAGNOSIS — I059 Rheumatic mitral valve disease, unspecified: Secondary | ICD-10-CM

## 2013-01-23 DIAGNOSIS — I4891 Unspecified atrial fibrillation: Principal | ICD-10-CM

## 2013-01-23 LAB — BASIC METABOLIC PANEL
CO2: 33 mEq/L — ABNORMAL HIGH (ref 19–32)
Chloride: 100 mEq/L (ref 96–112)
GFR calc Af Amer: 80 mL/min — ABNORMAL LOW (ref >90)
Potassium: 3.3 mEq/L — ABNORMAL LOW (ref 3.5–5.1)

## 2013-01-23 LAB — TSH: TSH: 1.616 u[IU]/mL (ref 0.350–4.500)

## 2013-01-23 LAB — TROPONIN I: Troponin I: <0.3 ng/mL (ref <0.30)

## 2013-01-23 LAB — OCCULT BLOOD X 1 CARD TO LAB, STOOL: Fecal Occult Bld: NEGATIVE

## 2013-01-23 MED ORDER — DORZOLAMIDE HCL-TIMOLOL MAL 2-0.5 % OP SOLN
1.0000 [drp] | Freq: Two times a day (BID) | OPHTHALMIC | Status: DC
  Administered 2013-01-23 – 2013-01-27 (×8): 1 [drp] via OPHTHALMIC
  Filled 2013-01-23: qty 10

## 2013-01-23 NOTE — Plan of Care (Signed)
Problem: Phase I Progression Outcomes Goal: EF % per last Echo/documented,Core Reminder form on chart Outcome: Not Met (add Reason) Patient has no recorded EF information; has never had 2-D echocardiography or cardiac catheterization.

## 2013-01-23 NOTE — Progress Notes (Signed)
UR Completed Samentha Perham Graves-Bigelow, RN,BSN [phone]  

## 2013-01-23 NOTE — Progress Notes (Signed)
Pt reported started to have diarrhea. He said he had been on 2 rounds of antibiotics prior to admission. He also noted that he had a small amount of bright red blood with the last watery bowel movement. Pt placed on contact precautions and Suanne Marker , PA paged. Awaiting return of call. Will cont to monitor

## 2013-01-23 NOTE — Progress Notes (Signed)
  Echocardiogram 2D Echocardiogram has been performed.  Chad Brown FRANCES 01/23/2013, 3:15 PM

## 2013-01-23 NOTE — Care Management Note (Unsigned)
    Page 1 of 1   01/23/2013     4:05:52 PM   CARE MANAGEMENT NOTE 01/23/2013  Patient:  Chad Brown,Chad Brown   Account Number:  [id number]  Date Initiated:  01/23/2013  Documentation initiated by:  GRAVES-BIGELOW,Laquita Harlan  Subjective/Objective Assessment:   Pt admitted with rapid a fib. Placed on carizem gtt.     Action/Plan:   CM will continue to monitor for disposition needs. Pt may benefit from [facility] for disease management. At the time of visit - pt had friends in the room. Will discuss HH at a later time.   Anticipated DC Date:  01/26/2013   Anticipated DC Plan:  [place]  CM consult      Choice offered to / List presented to:             Status of service:  In process, will continue to follow Medicare Important Message given?   (If response is "NO", the following Medicare IM given date fields will be blank) Date Medicare IM given:   Date Additional Medicare IM given:    Discharge Disposition:    Per UR Regulation:  Reviewed for med. necessity/level of care/duration of stay  If discussed at [place] of Stay Meetings, dates discussed:    Comments:

## 2013-01-23 NOTE — Progress Notes (Signed)
Patient evaluated for community based chronic disease management services with [facility] as a benefit of patient's [employer].  Spoke with patient and wife at bedside to explain [facility] services. Patient is able to self manage with his wife's assistance and can obtain a scale.  He is able to follow up with all medical appointment and procure medications.  Left contact information and [facility] literature at bedside. Made inpatient Case Manager aware of [facility] consult. Of note, [facility] Care Management services does not replace or interfere with any services that are arranged by inpatient case management or social work.  For additional questions or referrals please contact Corliss Blacker BSN RN [facility] Liaison at [phone].

## 2013-01-23 NOTE — Progress Notes (Signed)
Patient ID: Chad Brown, male   DOB: [birth year], 72 y.o.   MRN: [id number]    Subjective:  Denies SSCP, palpitations dyspnea improved  Objective:  Filed Vitals:   01/22/13 1934 01/22/13 2017 01/22/13 2046 01/23/13 0520  BP: 147/98 158/100  104/69  Pulse:  143  82  Temp:   97.4 F (36.3 C) 97.6 F (36.4 C)  TempSrc:   Oral Oral  Resp:  22    Height:      Weight:    230 lb 14.4 oz (104.736 kg)  SpO2:  92%  97%    Intake/Output from previous day:  Intake/Output Summary (Last 24 hours) at 01/23/13 0900 Last data filed at 01/23/13 0849  Gross per 24 hour  Intake 508.67 ml  Output   1850 ml  Net -1341.33 ml    Physical Exam: Affect appropriate Healthy:  appears stated age HEENT: normal Neck supple with no adenopathy JVP normal no bruits no thyromegaly Lungs basilar crackles  no wheezing and good diaphragmatic motion Heart:  S1/S2 no murmur, no rub, gallop or click PMI normal Abdomen: benighn, BS positve, no tenderness, no AAA no bruit.  No HSM or HJR Distal pulses intact with no bruits Plus one bilateral edema Neuro non-focal Skin warm and dry No muscular weakness   Lab Results: Basic Metabolic Panel:  Recent Labs  01/22/13 1914 01/23/13 0001  NA 142 141  K 3.5 3.3*  CL 102 100  CO2 31 33*  GLUCOSE 120* 123*  BUN 12 13  CREATININE 0.98 1.05  CALCIUM 9.1 8.9  MG 2.1  --    Liver Function Tests:  Recent Labs  01/22/13 1914  AST 21  ALT 30  ALKPHOS 70  BILITOT 0.6  PROT 7.1  ALBUMIN 3.9   No results found for this basename: LIPASE, AMYLASE,  in the last 72 hours CBC:  Recent Labs  01/22/13 1914  WBC 6.3  NEUTROABS 4.2  HGB 14.0  HCT 41.4  MCV 85.2  PLT 147*   Cardiac Enzymes:  Recent Labs  01/22/13 1914 01/23/13 0001 01/23/13 0600  TROPONINI <0.30 <0.30 <0.30   Thyroid Function Tests:  Recent Labs  01/22/13 1914  TSH 1.616   Imaging: Dg Chest Port 1 View  01/22/2013  *RADIOLOGY REPORT*  Clinical Data: Chest pain and  shortness of breath.  PORTABLE CHEST - 1 VIEW  Comparison: 06/17/2007.  Findings: The heart is enlarged with vascular congestion and pulmonary edema consistent with CHF.  No obvious effusions.  The bony thorax is intact.  IMPRESSION: CHF.   Original Report Authenticated By: Marijo Sanes, M.D.     Cardiac Studies:  ECG:  Afib nonspecific ST/T wave changes   Telemetry: Afib rates 100-120  Echo: pending  Medications:   . aspirin EC  81 mg Oral Daily  . carvedilol  6.25 mg Oral BID WC  . digoxin  0.125 mg Intravenous Once  . [START ON 01/24/2013] digoxin  0.125 mg Oral Daily  . furosemide  40 mg Intravenous BID  . potassium chloride  20 mEq Oral BID  . rivaroxaban  20 mg Oral Q supper  . sodium chloride  3 mL Intravenous Q12H     . diltiazem (CARDIZEM) infusion 15 mg/hr (01/23/13 0525)    Assessment/Plan:  Afib:  Rate control improved Day 2 xarelto  TEE / [facility] Monday   CHF: New diagnosis ? From rapid afib CE on CXR with edema and elevated BNP continue current dose of lasix Echo today  [name] 01/23/2013, 9:00 AM

## 2013-01-24 DIAGNOSIS — I426 Alcoholic cardiomyopathy: Secondary | ICD-10-CM | POA: Diagnosis present

## 2013-01-24 LAB — BASIC METABOLIC PANEL
CO2: 33 mEq/L — ABNORMAL HIGH (ref 19–32)
Chloride: 104 mEq/L (ref 96–112)
Glucose, Bld: 102 mg/dL — ABNORMAL HIGH (ref 70–99)
Potassium: 3.6 mEq/L (ref 3.5–5.1)
Sodium: 145 mEq/L (ref 135–145)

## 2013-01-24 MED ORDER — FUROSEMIDE 40 MG PO TABS
40.0000 mg | ORAL_TABLET | Freq: Every day | ORAL | Status: DC
  Administered 2013-01-25 – 2013-01-27 (×3): 40 mg via ORAL
  Filled 2013-01-24 (×3): qty 1

## 2013-01-24 MED ORDER — OFF THE BEAT BOOK
Freq: Once | Status: AC
  Administered 2013-01-25: 06:00:00
  Filled 2013-01-24: qty 1

## 2013-01-24 NOTE — Progress Notes (Signed)
Patient ID: Chad Brown, male   DOB: 1941-04-12, 72 y.o.   MRN: [id number]   SUBJECTIVE:  Feeling better.  Diuresing. Echo shows LV dysfunction  (EF  30-35%), new since echo of 2008.   Filed Vitals:   01/23/13 0520 01/23/13 1356 01/23/13 2038 01/24/13 0558  BP: 104/69 108/64 100/61 113/77  Pulse: 82 89 55 101  Temp: 97.6 F (36.4 C) 97.9 F (36.6 C) 98.2 F (36.8 C) 97.9 F (36.6 C)  TempSrc: Oral Oral Oral Oral  Resp:  18 18 20   Height:      Weight: 230 lb 14.4 oz (104.736 kg)   229 lb 15 oz (104.3 kg)  SpO2: 97% 96% 95% 93%    Intake/Output Summary (Last 24 hours) at 01/24/13 1131 Last data filed at 01/24/13 1024  Gross per 24 hour  Intake   1380 ml  Output   2550 ml  Net  -1170 ml    LABS: Basic Metabolic Panel:  Recent Labs  01/22/13 1914 01/23/13 0001 01/24/13 0630  NA 142 141 145  K 3.5 3.3* 3.6  CL 102 100 104  CO2 31 33* 33*  GLUCOSE 120* 123* 102*  BUN 12 13 14   CREATININE 0.98 1.05 1.08  CALCIUM 9.1 8.9 8.5  MG 2.1  --   --    Liver Function Tests:  Recent Labs  01/22/13 1914  AST 21  ALT 30  ALKPHOS 70  BILITOT 0.6  PROT 7.1  ALBUMIN 3.9   No results found for this basename: LIPASE, AMYLASE,  in the last 72 hours CBC:  Recent Labs  01/22/13 1914  WBC 6.3  NEUTROABS 4.2  HGB 14.0  HCT 41.4  MCV 85.2  PLT 147*   Cardiac Enzymes:  Recent Labs  01/22/13 1914 01/23/13 0001 01/23/13 0600  TROPONINI <0.30 <0.30 <0.30   BNP: No components found with this basename: POCBNP,  D-Dimer: No results found for this basename: DDIMER,  in the last 72 hours Hemoglobin A1C: No results found for this basename: HGBA1C,  in the last 72 hours Fasting Lipid Panel: No results found for this basename: CHOL, HDL, LDLCALC, TRIG, CHOLHDL, LDLDIRECT,  in the last 72 hours Thyroid Function Tests:  Recent Labs  01/22/13 1914  TSH 1.616    RADIOLOGY: Dg Chest Port 1 View  01/22/2013  *RADIOLOGY REPORT*  Clinical Data: Chest pain and  shortness of breath.  PORTABLE CHEST - 1 VIEW  Comparison: 06/17/2007.  Findings: The heart is enlarged with vascular congestion and pulmonary edema consistent with CHF.  No obvious effusions.  The bony thorax is intact.  IMPRESSION: CHF.   Original Report Authenticated By: Marijo Sanes, M.D.     PHYSICAL EXAM   Oriented x3. Lungs clear. No respiratoty distress. S1S2. Abdomen soft. No edema.   TELEMETRY:  I have reviewed 01/24/2013.  There is atrial fib. Rate controlled.   ASSESSMENT AND PLAN:    Atrial fibrillation     For TEE cardioversion Monday.     Cardiomyopathy      New finding of EF 30-35%. Will need to stop cardizem. Keep on Digoxin and Carvedilol. Start ACE when we are able.    Acute systolic CHF (congestive heart failure)   Change Lasix to PO. Follow renal.   Dola Argyle 01/24/2013 11:31 AM

## 2013-01-25 LAB — BASIC METABOLIC PANEL
CO2: 33 mEq/L — ABNORMAL HIGH (ref 19–32)
Calcium: 8.8 mg/dL (ref 8.4–10.5)
Chloride: 105 mEq/L (ref 96–112)
Potassium: 3.4 mEq/L — ABNORMAL LOW (ref 3.5–5.1)
Sodium: 144 mEq/L (ref 135–145)

## 2013-01-25 MED ORDER — POTASSIUM CHLORIDE CRYS ER 20 MEQ PO TBCR
40.0000 meq | EXTENDED_RELEASE_TABLET | Freq: Once | ORAL | Status: AC
  Administered 2013-01-25: 40 meq via ORAL
  Filled 2013-01-25: qty 2

## 2013-01-25 MED ORDER — LOPERAMIDE HCL 2 MG PO CAPS
2.0000 mg | ORAL_CAPSULE | ORAL | Status: DC | PRN
  Administered 2013-01-25: 2 mg via ORAL
  Filled 2013-01-25: qty 1

## 2013-01-25 NOTE — Progress Notes (Signed)
Patient ID: IKEY MATSEN, male   DOB: 01-10-1941, 72 y.o.   MRN: [id number]   Patient Name: Chad Brown Date of Encounter: 01/25/2013    SUBJECTIVE  Feeling much better with less shortness of breath. He is amazed his lost much weight and fluid. Still has a little bit of lower extremity edema. Numbers are stable. Potassium is low. Echocardiogram reviewed and shows diffuse hypokinesia with no segmental Uno Esau motion abnormality. He has moderate mitral regurgitation with moderate left atrial enlargement.  CURRENT MEDS . aspirin EC  81 mg Oral Daily  . carvedilol  6.25 mg Oral BID WC  . digoxin  0.125 mg Oral Daily  . dorzolamide-timolol  1 drop Right Eye BID  . furosemide  40 mg Oral Daily  . potassium chloride  20 mEq Oral BID  . potassium chloride  40 mEq Oral Once  . rivaroxaban  20 mg Oral Q supper  . sodium chloride  3 mL Intravenous Q12H    OBJECTIVE  Filed Vitals:   01/24/13 2035 01/25/13 0535 01/25/13 0700 01/25/13 0950  BP: 123/65 116/89 122/89 120/96  Pulse: 114 93 71 73  Temp: 98.6 F (37 C) 98 F (36.7 C)    TempSrc: Oral Oral    Resp: 18 20    Height:      Weight:  222 lb 8 oz (100.925 kg)    SpO2: 97% 95%      Intake/Output Summary (Last 24 hours) at 01/25/13 1032 Last data filed at 01/25/13 0900  Gross per 24 hour  Intake    960 ml  Output   1275 ml  Net   -315 ml   Filed Weights   01/23/13 0520 01/24/13 0558 01/25/13 0535  Weight: 230 lb 14.4 oz (104.736 kg) 229 lb 15 oz (104.3 kg) 222 lb 8 oz (100.925 kg)    PHYSICAL EXAM  General: Pleasant, NAD. Obese Neuro: Alert and oriented X 3. Moves all extremities spontaneously. Psych: Normal affect. HEENT:  Normal  Neck: Supple without bruits or JVD. Lungs:  Resp regular and unlabored, CTA. Heart: RRR no s3, s4, soft systolic murmur Abdomen: Soft, non-tender, non-distended, BS + x 4.  Extremities: No clubbing, cyanosis or edema. DP/PT/Radials 2+ and equal bilaterally.  Accessory Clinical  Findings  CBC  Recent Labs  01/22/13 1914  WBC 6.3  NEUTROABS 4.2  HGB 14.0  HCT 41.4  MCV 85.2  PLT [id number]*   Basic Metabolic Panel  Recent Labs  01/22/13 1914  01/24/13 0630 01/25/13 0455  NA 142  < > 145 144  K 3.5  < > 3.6 3.4*  CL 102  < > 104 105  CO2 31  < > 33* 33*  GLUCOSE 120*  < > 102* 96  BUN 12  < > 14 12  CREATININE 0.98  < > 1.08 1.01  CALCIUM 9.1  < > 8.5 8.8  MG 2.1  --   --   --   < > = values in this interval not displayed. Liver Function Tests  Recent Labs  01/22/13 1914  AST 21  ALT 30  ALKPHOS 70  BILITOT 0.6  PROT 7.1  ALBUMIN 3.9   No results found for this basename: LIPASE, AMYLASE,  in the last 72 hours Cardiac Enzymes  Recent Labs  01/22/13 1914 01/23/13 0001 01/23/13 0600  TROPONINI <0.30 <0.30 <0.30   BNP No components found with this basename: POCBNP,  D-Dimer No results found for this basename: DDIMER,  in the last  72 hours Hemoglobin A1C No results found for this basename: HGBA1C,  in the last 72 hours Fasting Lipid Panel No results found for this basename: CHOL, HDL, LDLCALC, TRIG, CHOLHDL, LDLDIRECT,  in the last 72 hours Thyroid Function Tests  Recent Labs  01/22/13 1914  TSH 1.616    TELE Atrial fib with well-controlled ventricular rate   Radiology/Studies  Dg Chest Port 1 View  01/22/2013  *RADIOLOGY REPORT*  Clinical Data: Chest pain and shortness of breath.  PORTABLE CHEST - 1 VIEW  Comparison: 06/17/2007.  Findings: The heart is enlarged with vascular congestion and pulmonary edema consistent with CHF.  No obvious effusions.  The bony thorax is intact.  IMPRESSION: CHF.   Original Report Authenticated By: Marijo Sanes, M.D.     ASSESSMENT AND PLAN  Active Problems:   Atrial fibrillation   cardiomyopathy   Acute systolic CHF (congestive heart failure)    His echo findings which suggest new onset systolic dysfunction perhaps from a nonischemic cardiomyopathy. He has diffuse hypokinesia, moderate  mitral regurgitation, and moderate left atrial enlargement. The onset of A. fib could of triggered his clinical demise. He is scheduled for TEE cardioversion tomorrow morning. We'll make n.p.o. We'll supplement potassium. He needs an ACE inhibitor after cardioversion. All questions answered.  Signed, Jenell Milliner MD

## 2013-01-26 ENCOUNTER — Inpatient Hospital Stay (HOSPITAL_COMMUNITY): Payer: Medicare Other | Admitting: Certified Registered"

## 2013-01-26 ENCOUNTER — Encounter (HOSPITAL_COMMUNITY)
Admission: AD | Disposition: A | Discharge status: Home / Self Care | Payer: Self-pay | Source: Ambulatory Visit | Attending: Cardiovascular Disease

## 2013-01-26 ENCOUNTER — Encounter (HOSPITAL_COMMUNITY): Payer: Self-pay | Admitting: Certified Registered"

## 2013-01-26 DIAGNOSIS — I4891 Unspecified atrial fibrillation: Secondary | ICD-10-CM

## 2013-01-26 DIAGNOSIS — I5021 Acute systolic (congestive) heart failure: Secondary | ICD-10-CM

## 2013-01-26 DIAGNOSIS — I509 Heart failure, unspecified: Secondary | ICD-10-CM

## 2013-01-26 HISTORY — PX: CARDIOVERSION: SHX1299

## 2013-01-26 HISTORY — PX: TEE WITHOUT CARDIOVERSION: SHX5443

## 2013-01-26 LAB — BASIC METABOLIC PANEL
BUN: 13 mg/dL (ref 6–23)
Chloride: 104 mEq/L (ref 96–112)
GFR calc Af Amer: >90 mL/min (ref >90)
Glucose, Bld: 98 mg/dL (ref 70–99)
Potassium: 3.7 mEq/L (ref 3.5–5.1)
Sodium: 143 mEq/L (ref 135–145)

## 2013-01-26 SURGERY — ECHOCARDIOGRAM, TRANSESOPHAGEAL
Anesthesia: General

## 2013-01-26 MED ORDER — FENTANYL CITRATE 0.05 MG/ML IJ SOLN
INTRAMUSCULAR | Status: DC | PRN
  Administered 2013-01-26 (×2): 25 ug via INTRAVENOUS

## 2013-01-26 MED ORDER — LOSARTAN POTASSIUM 50 MG PO TABS
50.0000 mg | ORAL_TABLET | Freq: Every day | ORAL | Status: DC
  Administered 2013-01-26 – 2013-01-27 (×2): 50 mg via ORAL
  Filled 2013-01-26 (×2): qty 1

## 2013-01-26 MED ORDER — BUTAMBEN-TETRACAINE-BENZOCAINE 2-2-14 % EX AERO
INHALATION_SPRAY | CUTANEOUS | Status: DC | PRN
  Administered 2013-01-26: 2 via TOPICAL

## 2013-01-26 MED ORDER — MIDAZOLAM HCL 10 MG/2ML IJ SOLN
INTRAMUSCULAR | Status: DC | PRN
  Administered 2013-01-26 (×2): 2 mg via INTRAVENOUS

## 2013-01-26 MED ORDER — PROPOFOL 10 MG/ML IV BOLUS
INTRAVENOUS | Status: DC | PRN
  Administered 2013-01-26: 40 mg via INTRAVENOUS

## 2013-01-26 MED ORDER — LIDOCAINE HCL (CARDIAC) 20 MG/ML IV SOLN
INTRAVENOUS | Status: DC | PRN
  Administered 2013-01-26: 50 mg via INTRAVENOUS

## 2013-01-26 NOTE — Transfer of Care (Signed)
Immediate Anesthesia Transfer of Care Note  Patient: Chad Brown  Procedure(s) Performed: Procedure(s) with comments: TRANSESOPHAGEAL ECHOCARDIOGRAM (TEE) (N/A) - [name] anes. /  CARDIOVERSION (N/A)  Patient Location: Endoscopy Unit  Anesthesia Type:General  Level of Consciousness: sedated  Airway & Oxygen Therapy: Patient Spontanous Breathing and Patient connected to nasal cannula oxygen  Post-op Assessment: Report given to PACU RN, Post -op Vital signs reviewed and stable and Patient moving all extremities  Post vital signs: Reviewed and stable  Complications: No apparent anesthesia complications

## 2013-01-26 NOTE — Progress Notes (Signed)
  Patient ID: ANTAWON Brown, male   DOB: 30-Oct-1940, 72 y.o.   MRN: [id number]   Patient Name: Chad Brown Date of Encounter: 01/26/2013    SUBJECTIVE  Much improved dyspnea Echocardiogram reviewed and shows diffuse hypokinesia with no segmental wall motion abnormality. He has moderate mitral regurgitation with moderate left atrial enlargement.  CURRENT MEDS . aspirin EC  81 mg Oral Daily  . carvedilol  6.25 mg Oral BID WC  . digoxin  0.125 mg Oral Daily  . dorzolamide-timolol  1 drop Right Eye BID  . furosemide  40 mg Oral Daily  . potassium chloride  20 mEq Oral BID  . rivaroxaban  20 mg Oral Q supper  . sodium chloride  3 mL Intravenous Q12H    OBJECTIVE  Filed Vitals:   01/25/13 1340 01/25/13 1759 01/25/13 2029 01/26/13 0528  BP: 127/70 134/86 135/86 125/81  Pulse: 64 82 115 115  Temp: 98 F (36.7 C)  98.5 F (36.9 C) 97.7 F (36.5 C)  TempSrc: Oral  Oral Oral  Resp: 20  20 20   Height:      Weight:    219 lb 4.8 oz (99.474 kg)  SpO2: 100%  99% 97%    Intake/Output Summary (Last 24 hours) at 01/26/13 0811 Last data filed at 01/26/13 0530  Gross per 24 hour  Intake    960 ml  Output   2525 ml  Net  -1565 ml   Filed Weights   01/24/13 0558 01/25/13 0535 01/26/13 0528  Weight: 229 lb 15 oz (104.3 kg) 222 lb 8 oz (100.925 kg) 219 lb 4.8 oz (99.474 kg)    PHYSICAL EXAM  General: Pleasant, NAD. Obese Neuro: Alert and oriented X 3. Moves all extremities spontaneously. Psych: Normal affect. HEENT:  Normal  Neck: Supple without bruits or JVD. Lungs:  Resp regular and unlabored, CTA. Heart: RRR no s3, s4, soft systolic murmur Abdomen: Soft, non-tender, non-distended, BS + x 4.  Extremities: No clubbing, cyanosis or edema. DP/PT/Radials 2+ and equal bilaterally.  Accessory Clinical Findings  CBC No results found for this basename: WBC, NEUTROABS, HGB, HCT, MCV, PLT,  in the last 72 hours Basic Metabolic Panel  Recent Labs  01/25/13 0455  01/26/13 0634  NA 144 143  K 3.4* 3.7  CL 105 104  CO2 33* 33*  GLUCOSE 96 98  BUN 12 13  CREATININE 1.01 0.97  CALCIUM 8.8 9.0    TELE Atrial fib with well-controlled ventricular rate   Radiology/Studies  Dg Chest Port 1 View  01/22/2013  *RADIOLOGY REPORT*  Clinical Data: Chest pain and shortness of breath.  PORTABLE CHEST - 1 VIEW  Comparison: 06/17/2007.  Findings: The heart is enlarged with vascular congestion and pulmonary edema consistent with CHF.  No obvious effusions.  The bony thorax is intact.  IMPRESSION: CHF.   Original Report Authenticated By: Marijo Sanes, M.D.     ASSESSMENT AND PLAN  Active Problems:   Atrial fibrillation   cardiomyopathy   Acute systolic CHF (congestive heart failure)    His echo findings which suggest new onset systolic dysfunction perhaps from a nonischemic cardiomyopathy. He has diffuse hypokinesia, moderate mitral regurgitation, and moderate left atrial enlargement. The onset of A. fib could of triggered his clinical demise. He is scheduled for TEE cardioversion this  morning. Apparently some issues scheduling  We'll supplement potassium. Start cozaar today.  Possible discharge in am if [facility] successful.  If not consider starting tikosyn Signed, [name] MD

## 2013-01-26 NOTE — CV Procedure (Signed)
See full TEE report in [employer]; no LAA thrombus identified. Patient subsequently sedated by anesthesia with diprovan 40 mg and lidocaine 40 mg IV; synchronized DCCV with 120 joules resulted in atrial fibrillation; repeat attempt with 150 joules resulted in sinus rhythm. No immediate complications; continue xeralto. Chad Brown

## 2013-01-26 NOTE — Progress Notes (Signed)
  Echocardiogram 2D Echocardiogram has been performed.  Basilia Jumbo 01/26/2013, 3:30 PM

## 2013-01-26 NOTE — Anesthesia Postprocedure Evaluation (Signed)
  Anesthesia Post-op Note  Patient: Chad Brown  Procedure(s) Performed: Procedure(s) with comments: TRANSESOPHAGEAL ECHOCARDIOGRAM (TEE) (N/A) - [name] anes. /  CARDIOVERSION (N/A)  Patient Location: Endoscopy Unit  Anesthesia Type:General  Level of Consciousness: awake  Airway and Oxygen Therapy: Patient Spontanous Breathing and Patient connected to nasal cannula oxygen  Post-op Pain: none  Post-op Assessment: Post-op Vital signs reviewed, Patient's Cardiovascular Status Stable, Respiratory Function Stable, Patent Airway and No signs of Nausea or vomiting  Post-op Vital Signs: Reviewed and stable  Complications: No apparent anesthesia complications

## 2013-01-26 NOTE — Preoperative (Signed)
Beta Blockers   Reason not to administer Beta Blockers:Not Applicable 

## 2013-01-26 NOTE — Anesthesia Preprocedure Evaluation (Addendum)
Anesthesia Evaluation  Patient identified by MRN, date of birth, ID band Patient awake    Reviewed: Allergy & Precautions, H&P , NPO status , Patient's Chart, lab work & pertinent test results, reviewed documented beta blocker date and time   Airway Mallampati: II TM Distance: >3 FB Neck ROM: Full    Dental  (+) Edentulous Upper and Edentulous Lower   Pulmonary          Cardiovascular hypertension, Pt. on medications and Pt. on home beta blockers + dysrhythmias Atrial Fibrillation     Neuro/Psych    GI/Hepatic   Endo/Other    Renal/GU      Musculoskeletal   Abdominal   Peds  Hematology   Anesthesia Other Findings   Reproductive/Obstetrics                           Anesthesia Physical Anesthesia Plan  ASA: III  Anesthesia Plan: General   Post-op Pain Management:    Induction: Intravenous  Airway Management Planned: Mask  Additional Equipment:   Intra-op Plan:   Post-operative Plan: Extubation in OR  Informed Consent: I have reviewed the patients History and Physical, chart, labs and discussed the procedure including the risks, benefits and alternatives for the proposed anesthesia with the patient or authorized representative who has indicated his/her understanding and acceptance.   Dental advisory given  Plan Discussed with: CRNA, Anesthesiologist and Surgeon  Anesthesia Plan Comments:         Anesthesia Quick Evaluation

## 2013-01-26 NOTE — H&P (View-Only) (Signed)
  Patient ID: Chad Brown, male   DOB: 02-Dec-1940, 72 y.o.   MRN: [id number]   Patient Name: Chad Brown Date of Encounter: 01/26/2013    SUBJECTIVE  Much improved dyspnea Echocardiogram reviewed and shows diffuse hypokinesia with no segmental wall motion abnormality. He has moderate mitral regurgitation with moderate left atrial enlargement.  CURRENT MEDS . aspirin EC  81 mg Oral Daily  . carvedilol  6.25 mg Oral BID WC  . digoxin  0.125 mg Oral Daily  . dorzolamide-timolol  1 drop Right Eye BID  . furosemide  40 mg Oral Daily  . potassium chloride  20 mEq Oral BID  . rivaroxaban  20 mg Oral Q supper  . sodium chloride  3 mL Intravenous Q12H    OBJECTIVE  Filed Vitals:   01/25/13 1340 01/25/13 1759 01/25/13 2029 01/26/13 0528  BP: 127/70 134/86 135/86 125/81  Pulse: 64 82 115 115  Temp: 98 F (36.7 C)  98.5 F (36.9 C) 97.7 F (36.5 C)  TempSrc: Oral  Oral Oral  Resp: 20  20 20   Height:      Weight:    219 lb 4.8 oz (99.474 kg)  SpO2: 100%  99% 97%    Intake/Output Summary (Last 24 hours) at 01/26/13 0811 Last data filed at 01/26/13 0530  Gross per 24 hour  Intake    960 ml  Output   2525 ml  Net  -1565 ml   Filed Weights   01/24/13 0558 01/25/13 0535 01/26/13 0528  Weight: 229 lb 15 oz (104.3 kg) 222 lb 8 oz (100.925 kg) 219 lb 4.8 oz (99.474 kg)    PHYSICAL EXAM  General: Pleasant, NAD. Obese Neuro: Alert and oriented X 3. Moves all extremities spontaneously. Psych: Normal affect. HEENT:  Normal  Neck: Supple without bruits or JVD. Lungs:  Resp regular and unlabored, CTA. Heart: RRR no s3, s4, soft systolic murmur Abdomen: Soft, non-tender, non-distended, BS + x 4.  Extremities: No clubbing, cyanosis or edema. DP/PT/Radials 2+ and equal bilaterally.  Accessory Clinical Findings  CBC No results found for this basename: WBC, NEUTROABS, HGB, HCT, MCV, PLT,  in the last 72 hours Basic Metabolic Panel  Recent Labs  01/25/13 0455  01/26/13 0634  NA 144 143  K 3.4* 3.7  CL 105 104  CO2 33* 33*  GLUCOSE 96 98  BUN 12 13  CREATININE 1.01 0.97  CALCIUM 8.8 9.0    TELE Atrial fib with well-controlled ventricular rate   Radiology/Studies  Dg Chest Port 1 View  01/22/2013  *RADIOLOGY REPORT*  Clinical Data: Chest pain and shortness of breath.  PORTABLE CHEST - 1 VIEW  Comparison: 06/17/2007.  Findings: The heart is enlarged with vascular congestion and pulmonary edema consistent with CHF.  No obvious effusions.  The bony thorax is intact.  IMPRESSION: CHF.   Original Report Authenticated By: Marijo Sanes, M.D.     ASSESSMENT AND PLAN  Active Problems:   Atrial fibrillation   cardiomyopathy   Acute systolic CHF (congestive heart failure)    His echo findings which suggest new onset systolic dysfunction perhaps from a nonischemic cardiomyopathy. He has diffuse hypokinesia, moderate mitral regurgitation, and moderate left atrial enlargement. The onset of A. fib could of triggered his clinical demise. He is scheduled for TEE cardioversion this  morning. Apparently some issues scheduling  We'll supplement potassium. Start cozaar today.  Possible discharge in am if [facility] successful.  If not consider starting tikosyn Signed, [name] MD

## 2013-01-26 NOTE — Interval H&P Note (Signed)
History and Physical Interval Note:  01/26/2013 3:00 PM  Chad Brown  has presented today for surgery, with the diagnosis of a fib  The various methods of treatment have been discussed with the patient and family. After consideration of risks, benefits and other options for treatment, the patient has consented to  Procedure(s) with comments: TRANSESOPHAGEAL ECHOCARDIOGRAM (TEE) (N/A) - [name] anes. /  CARDIOVERSION (N/A) as a surgical intervention .  The patient's history has been reviewed, patient examined, no change in status, stable for surgery.  I have reviewed the patient's chart and labs.  Questions were answered to the patient's satisfaction.     Kirk Ruths

## 2013-01-27 ENCOUNTER — Encounter (HOSPITAL_COMMUNITY): Payer: Self-pay | Admitting: Cardiology

## 2013-01-27 ENCOUNTER — Telehealth: Payer: Self-pay | Admitting: Cardiovascular Disease

## 2013-01-27 MED ORDER — FUROSEMIDE 20 MG PO TABS: 20.0000 mg | ORAL_TABLET | Freq: Every day | ORAL | Status: DC

## 2013-01-27 MED ORDER — POTASSIUM CHLORIDE CRYS ER 20 MEQ PO TBCR: 10.0000 meq | EXTENDED_RELEASE_TABLET | Freq: Every day | ORAL | Status: DC

## 2013-01-27 MED ORDER — CARVEDILOL 6.25 MG PO TABS: 6.2500 mg | ORAL_TABLET | Freq: Two times a day (BID) | ORAL | Status: DC

## 2013-01-27 MED ORDER — LOSARTAN POTASSIUM 50 MG PO TABS: 50.0000 mg | ORAL_TABLET | Freq: Every day | ORAL | Status: DC

## 2013-01-27 MED ORDER — RIVAROXABAN 20 MG PO TABS: 20.0000 mg | ORAL_TABLET | Freq: Every day | ORAL | Status: DC

## 2013-01-27 NOTE — Progress Notes (Signed)
DC orders received.  Patient stable with no S/S of distress.  Medication and discharge information reviewed with patient and patient's wife.  Patient DC home with wife. Grayer Sproles Marie  

## 2013-01-27 NOTE — Progress Notes (Signed)
   Patient ID: NECO VIVERITO, male   DOB: 1941/09/23, 72 y.o.   MRN: [id number]   Patient Name: Chad Brown Date of Encounter: 01/27/2013    SUBJECTIVE  Much improved dyspnea Weight down Ready to go home Echocardiogram reviewed and shows diffuse hypokinesia with no segmental wall motion abnormality. He has moderate mitral regurgitation with moderate left atrial enlargement.  CURRENT MEDS . aspirin EC  81 mg Oral Daily  . carvedilol  6.25 mg Oral BID WC  . dorzolamide-timolol  1 drop Right Eye BID  . furosemide  40 mg Oral Daily  . losartan  50 mg Oral Daily  . potassium chloride  20 mEq Oral BID  . rivaroxaban  20 mg Oral Q supper  . sodium chloride  3 mL Intravenous Q12H    OBJECTIVE  Filed Vitals:   01/26/13 1550 01/26/13 2150 01/27/13 0538 01/27/13 0722  BP: 111/75 132/84 116/77 115/77  Pulse:  56 62 61  Temp:  98.1 F (36.7 C) 97.5 F (36.4 C)   TempSrc:  Oral Oral   Resp: 21 18 18    Height:      Weight:   221 lb (100.245 kg)   SpO2: 96% 99% 97% 97%    Intake/Output Summary (Last 24 hours) at 01/27/13 0913 Last data filed at 01/26/13 1852  Gross per 24 hour  Intake    450 ml  Output    400 ml  Net     50 ml   Filed Weights   01/25/13 0535 01/26/13 0528 01/27/13 0538  Weight: 222 lb 8 oz (100.925 kg) 219 lb 4.8 oz (99.474 kg) 221 lb (100.245 kg)    PHYSICAL EXAM  General: Pleasant, NAD. Obese Neuro: Alert and oriented X 3. Moves all extremities spontaneously. Psych: Normal affect. HEENT:  Normal  Neck: Supple without bruits or JVD. Lungs:  Resp regular and unlabored, CTA. Heart: RRR no s3, s4, soft systolic murmur Abdomen: Soft, non-tender, non-distended, BS + x 4.  Extremities: No clubbing, cyanosis or edema. DP/PT/Radials 2+ and equal bilaterally.  Accessory Clinical Findings  CBC No results found for this basename: WBC, NEUTROABS, HGB, HCT, MCV, PLT,  in the last 72 hours Basic Metabolic Panel  Recent Labs  01/25/13 0455  01/26/13 0634  NA 144 143  K 3.4* 3.7  CL 105 104  CO2 33* 33*  GLUCOSE 96 98  BUN 12 13  CREATININE 1.01 0.97  CALCIUM 8.8 9.0    TELE NSR 01/27/2013    Radiology/Studies  Dg Chest Port 1 View  01/22/2013  *RADIOLOGY REPORT*  Clinical Data: Chest pain and shortness of breath.  PORTABLE CHEST - 1 VIEW  Comparison: 06/17/2007.  Findings: The heart is enlarged with vascular congestion and pulmonary edema consistent with CHF.  No obvious effusions.  The bony thorax is intact.  IMPRESSION: CHF.   Original Report Authenticated By: Marijo Sanes, M.D.     ASSESSMENT AND PLAN  Active Problems:   Atrial fibrillation   cardiomyopathy   Acute systolic CHF (congestive heart failure)    Afib with successful TEE / [facility] yesterday Continue xarelto and coreg.   CHF:  Hopefully will improve with restoration of sinus.  Continue losartin 100 mg  D/C with Lasix 20 and Kdur 10  F/U with me in 4 weeks Signed, [name] MD

## 2013-01-27 NOTE — Discharge Summary (Signed)
Discharge Summary   Patient ID: Chad Brown MRN: [id number], DOB/AGE: [birth year] 72 y.o. Admit date: 01/22/2013 D/C date:     01/27/2013  Primary Cardiologist: Johnsie Cancel  Primary Discharge Diagnoses:  1. Newly recognized atrial fibrillation with RVR - started on Xarelto - s/p TEE/DCCV 01/26/13 with restoration of NSR 2. Acute systolic CHF - EF 99991111, diffuse hypokinesis, question if NICM 3. HTN  Secondary Discharge Diagnoses:  1. HL 2. Myocardial infarction 1998 treated medically 3. Arthritis 4. Chronic back pain 5. Melanoma of R eye in [place] [facility] Course:Chad Brown is a 72 y/o M with history of HTN and MI in 1998 treated with medication who presented to the office 01/22/13 for evaluation of SOB. He has not been feeling well since 3/29 with cough and dyspnea. He has been evaluated by primary care and treated with antibiotics but symptoms did not improve. On 01/22/13 he noted increased pulse, PND and orthopnea. He reported weight gain of 15 lbs over the last month with abdominal distention. In the office he was found to be in rapid atrial fibrillation with signs of CHF. EKG showed rapid afib rate 132 low voltage septal infarct. He was admitted to the hospital for further evaluation. Xarelto was initiated and long with IV cardizem. Lasix was started. Troponins were neg x 3. TSH was WNL. 2D echo confirmed diagnosis of systolic CHF with EF 99991111, diffuse HK, mild-mod MR, mod dilated LA, mildly dilated RV/RA, mildly increased PA pressure. Cardizem was discontinued and he was maintained on Coreg. Dr. Verl Blalock postulated that his new onset systolic dysfunction was perhaps from a nonischemic cardiomyopathy ?related to atrial fibrillation. Cozaar was started. He underwent TEE/DCCV with successful conversion to NSR. He has diuresed from 232lbs to 221lbs and is -6L. He feels much better with improved breathing. [facility] care management was requested. It is hoped that his CHF will improve with restoration of  sinus rhythm. If it does not, would consider further ischemic eval. Dr. Johnsie Cancel has seen and examined him today and feels he is stable for discharge. Per discussion to clarify with Dr. Johnsie Cancel, will discontinue ASA and continue Losartan at 50mg  daily. Our schedulers are currently trying to arrange a 1 week transition of care appointment and they will contact him with this appointment. We have also left a message to plug him into the blood thinner clinic given initiation of Xarelto.  Discharge Vitals: Blood pressure 114/75, pulse 64, temperature 97.5 F (36.4 C), temperature source Oral, resp. rate 18, height 5\' 7"  (1.702 m), weight 221 lb (100.245 kg), SpO2 97.00%.  Labs: Lab Results  Component Value Date   WBC 6.3 01/22/2013   HGB 14.0 01/22/2013   HCT 41.4 01/22/2013   MCV 85.2 01/22/2013   PLT 147* 01/22/2013    Recent Labs Lab 01/22/13 1914  01/26/13 0634  NA 142  < > 143  K 3.5  < > 3.7  CL 102  < > 104  CO2 31  < > 33*  BUN 12  < > 13  CREATININE 0.98  < > 0.97  CALCIUM 9.1  < > 9.0  PROT 7.1  --   --   BILITOT 0.6  --   --   ALKPHOS 70  --   --   ALT 30  --   --   AST 21  --   --   GLUCOSE 120*  < > 98  < > = values in this interval not displayed. No results found for this  basename: CKTOTAL, CKMB, TROPONINI,  in the last 72 hours No results found for this basename: CHOL,  HDL,  LDLCALC,  TRIG   No results found for this basename: DDIMER    Diagnostic Studies/Procedures   Dg Chest Port 1 View 01/22/2013  *RADIOLOGY REPORT*  Clinical Data: Chest pain and shortness of breath.  PORTABLE CHEST - 1 VIEW  Comparison: 06/17/2007.  Findings: The heart is enlarged with vascular congestion and pulmonary edema consistent with CHF.  No obvious effusions.  The bony thorax is intact.  IMPRESSION: CHF.   Original Report Authenticated By: Marijo Sanes, M.D.    2D Echo 01/23/13 Study Conclusions - Left ventricle: The cavity size was mildly dilated. Wall thickness was increased in a pattern of  mild LVH. Systolic function was moderately to severely reduced. The estimated ejection fraction was in the range of 30% to 35%. Diffuse hypokinesis. The study is not technically sufficient to allow evaluation of LV diastolic function. - Mitral valve: Mild to moderate regurgitation. - Left atrium: The atrium was moderately dilated. - Right ventricle: The cavity size was mildly dilated. - Right atrium: The atrium was mildly dilated. - Pulmonary arteries: Systolic pressure was mildly increased. PA peak pressure: 46mm Hg (S). - Pericardium, extracardiac: A trivial pericardial effusion was identified. There was a left pleural effusion.  TEE/DCCV 01/26/13 Study Conclusions  - Left ventricle: Systolic function was moderately to severely reduced. The estimated ejection fraction was in the range of 30% to 35%. Diffuse hypokinesis. - Aortic valve: Mildly calcified annulus. - Mitral valve: No evidence of vegetation. Mild regurgitation. - Left atrium: The atrium was moderately dilated. No evidence of thrombus in the atrial cavity or appendage. - Atrial septum: No defect or patent foramen ovale was identified. - Tricuspid valve: No evidence of vegetation  Discharge Medications     Medication List    STOP taking these medications       amLODipine 10 MG tablet  Commonly known as:  NORVASC     aspirin 81 MG tablet     hydrochlorothiazide 12.5 MG capsule  Commonly known as:  MICROZIDE     meloxicam 15 MG tablet  Commonly known as:  MOBIC     naproxen sodium 220 MG tablet  Commonly known as:  ANAPROX      TAKE these medications       albuterol 108 (90 BASE) MCG/ACT inhaler  Commonly known as:  PROVENTIL HFA;VENTOLIN HFA  Inhale 2 puffs into the lungs every 6 (six) hours as needed for wheezing.     carvedilol 6.25 MG tablet  Commonly known as:  COREG  Take 1 tablet (6.25 mg total) by mouth 2 (two) times daily with a meal.     dorzolamide-timolol 22.3-6.8 MG/ML ophthalmic solution    Commonly known as:  COSOPT  Place 1 drop into the right eye 2 (two) times daily.     furosemide 20 MG tablet  Commonly known as:  LASIX  Take 1 tablet (20 mg total) by mouth daily.     gabapentin 100 MG capsule  Commonly known as:  NEURONTIN  Take 200-300 mg by mouth 2 (two) times daily. Take 2 capsules in the morning and 3 capsules at night     LIVALO 2 MG Tabs  Generic drug:  Pitavastatin Calcium  Take 2 mg by mouth daily.     loratadine 10 MG tablet  Commonly known as:  CLARITIN  Take 10 mg by mouth daily as needed for allergies.  losartan 50 MG tablet  Commonly known as:  COZAAR  Take 1 tablet (50 mg total) by mouth daily.     NIASPAN 500 MG CR tablet  Generic drug:  niacin  Take 1,000 mg by mouth at bedtime.     potassium chloride SA 20 MEQ tablet  Commonly known as:  K-DUR,KLOR-CON  Take 0.5 tablets (10 mEq total) by mouth daily.     Rivaroxaban 20 MG Tabs  Commonly known as:  XARELTO  Take 1 tablet (20 mg total) by mouth daily with supper.     tamsulosin 0.4 MG Caps  Commonly known as:  FLOMAX  Take 0.4 mg by mouth daily.        Disposition   The patient will be discharged in stable condition to home. Discharge Orders   Future Appointments Provider Department Dept Phone   02/02/2013 9:30 AM Imogene Burn, PA-C [place]) [phone]   Future Orders Complete By Expires     Diet - low sodium heart healthy  As directed     Discharge instructions  As directed     Comments:      Dr. Johnsie Cancel has stopped your Mobic and Naproxen since these medicines can increase risk of stomach bleeding while on Xarelto. Please talk to your primary doctor for alternatives. Tylenol as directed is acceptable.    Increase activity slowly  As directed     Scheduling Instructions:      Your heart ultrasound showed weakness of the heart muscle this admission. This may make you more susceptible to weight gain from fluid retention, which can lead to  symptoms that we call heart failure. For patients with this condition, we give them these special instructions:  1. Follow a low-salt diet and watch your fluid intake. In general, you should not be taking in more than 2 liters of fluid per day. Some patients are restricted to less than 1.5 liters. This includes sources of water in foods like soup. 2. Weigh yourself on the same scale at same time of day and keep a log. 3. Call your doctor: (Anytime you feel any of the following symptoms)  - 3-4 pound weight gain in 1-2 days or 2 pounds overnight  - Shortness of breath, with or without a dry hacking cough  - Swelling in the hands, feet or stomach  - If you have to sleep on extra pillows at night in order to breathe      Follow-up Information   Follow up with Chad Rouge, MD. (Our office will call you for your followup appointment. Call the office if you have not heard from [place] within 3 days.)    Contact information:   [place], [address] [place] [address] [phone]         Duration of Discharge Encounter: Greater than 30 minutes including physician and PA time.  Signed, Dayna Dunn PA-C 01/27/2013, 10:07 AM

## 2013-01-27 NOTE — Telephone Encounter (Signed)
New  Problem    TCM 7 days with Estella Husk on  5/5 @ 9:30 am   Per Hinton Dyer PA

## 2013-01-28 NOTE — Telephone Encounter (Signed)
TCM pt doing well; has all medication needed except he said his insurance refused to pay for Xarelto, so he has just enough till he comes for the O/V . Pt is aware of the appointment with Ermalinda Barrios PA on 02/02/13 at 9:30 AM.

## 2013-01-28 NOTE — Telephone Encounter (Signed)
TCM. Called pt left a message to call back.

## 2013-01-28 NOTE — Telephone Encounter (Signed)
Follow-up:    Patient returned your call.  Please call back. 

## 2013-01-28 NOTE — Progress Notes (Signed)
RN CM did call the pt at home today in reference to [facility] RN for disease management for CHF. Pt refused HHRN services at this time. Stated he feels pretty good and has f/u appointments set up.  Pt stated that he had just purchased a scale this am. He will start to weigh in the morning. He states that he received his xarelto 10 day free card and has a f/u appointment on Monday with Dr. Johnsie Cancel. CM did relay to him that if he feels he will need a HHRN in the future to contact his PCP and ask them to set it up. NO further needs from CM at this time. Bethena Roys, RN,BSN [phone]

## 2013-02-02 ENCOUNTER — Ambulatory Visit (INDEPENDENT_AMBULATORY_CARE_PROVIDER_SITE_OTHER): Payer: Medicare Other | Admitting: Physician Assistant

## 2013-02-02 ENCOUNTER — Encounter: Payer: Self-pay | Admitting: Physician Assistant

## 2013-02-02 VITALS — BP 131/69 | HR 61 | Ht 65.5 in | Wt 217.4 lb

## 2013-02-02 DIAGNOSIS — I4891 Unspecified atrial fibrillation: Secondary | ICD-10-CM

## 2013-02-02 DIAGNOSIS — I10 Essential (primary) hypertension: Secondary | ICD-10-CM

## 2013-02-02 DIAGNOSIS — I426 Alcoholic cardiomyopathy: Secondary | ICD-10-CM

## 2013-02-02 LAB — BASIC METABOLIC PANEL
CO2: 32 mEq/L (ref 19–32)
Chloride: 105 mEq/L (ref 96–112)
Creatinine, Ser: 1.2 mg/dL (ref 0.4–1.5)
Potassium: 4 mEq/L (ref 3.5–5.1)

## 2013-02-02 NOTE — Assessment & Plan Note (Signed)
Patient underwent TEE guided cardioversion and remains in sinus rhythm. Continue Xarelto, Coreg

## 2013-02-02 NOTE — Assessment & Plan Note (Signed)
Stable

## 2013-02-02 NOTE — Patient Instructions (Addendum)
Your provider recommends that you start a 2 gram sodium diet. Please see handout given to you at todays visit.  Your physician recommends that you return for lab work in: today   Your physician recommends that you schedule a follow-up appointment in: 1 month

## 2013-02-02 NOTE — Progress Notes (Signed)
HPI:   This is a 72 year old white male patient of Dr. Johnsie Cancel who was found to be in rapid atrial fibrillation and heart failure last week. He was admitted to the hospital and underwent TEE guided cardioversion. He is diuresed approximately 20 pounds since then and feels so much better. He denies any chest pain, palpitations, dyspnea, dyspnea on exertion, dizziness, or presyncope. 2-D echo in the hospital showed an EF of 30-35% with diffuse hypokinesis and moderately dilated LA and mild MR. He says he has a remote history of an MI.  No Known Allergies  Current Outpatient Prescriptions on File Prior to Visit: albuterol (PROVENTIL HFA;VENTOLIN HFA) 108 (90 BASE) MCG/ACT inhaler, Inhale 2 puffs into the lungs every 6 (six) hours as needed for wheezing., Disp: , Rfl:  carvedilol (COREG) 6.25 MG tablet, Take 1 tablet (6.25 mg total) by mouth 2 (two) times daily with a meal., Disp: 60 tablet, Rfl: 6 dorzolamide-timolol (COSOPT) 22.3-6.8 MG/ML ophthalmic solution, Place 1 drop into the right eye 2 (two) times daily., Disp: , Rfl:  furosemide (LASIX) 20 MG tablet, Take 1 tablet (20 mg total) by mouth daily., Disp: 30 tablet, Rfl: 6 gabapentin (NEURONTIN) 100 MG capsule, Take 200-300 mg by mouth 2 (two) times daily. Take 2 capsules in the morning and 3 capsules at night, Disp: , Rfl:  loratadine (CLARITIN) 10 MG tablet, Take 10 mg by mouth daily as needed for allergies. , Disp: , Rfl:  losartan (COZAAR) 50 MG tablet, Take 1 tablet (50 mg total) by mouth daily., Disp: 30 tablet, Rfl: 6 NIASPAN 500 MG CR tablet, Take 1,000 mg by mouth at bedtime. , Disp: , Rfl:   Pitavastatin Calcium (LIVALO) 2 MG TABS, Take 2 mg by mouth daily., Disp: , Rfl:  potassium chloride SA (K-DUR,KLOR-CON) 20 MEQ tablet, Take 0.5 tablets (10 mEq total) by mouth daily., Disp: , Rfl:  Rivaroxaban (XARELTO) 20 MG TABS, Take 1 tablet (20 mg total) by mouth daily with supper., Disp: 30 tablet, Rfl: 6 tamsulosin (FLOMAX) 0.4 MG CAPS, Take  0.4 mg by mouth daily., Disp: , Rfl:   No current facility-administered medications on file prior to visit.   Past Medical History:   Hyperlipidemia                                               Hypertension                                                 Myocardial infarction                           [id number]         Systolic CHF                                                   Comment:a. New dx 12/2012 ?NICM, may be r/t afib (has               not had recent ischemic eval).   Atrial fibrillation  Comment:a. Dx 12/2012, s/p TEE/DCCV 01/26/13. b. On               Xarelto.   Arthritis                                                      Comment:"about all my joints" (01/26/2013)   Chronic back pain                                            Melanoma of eye                                 2000's         Comment:"right"  Past Surgical History:   JOINT REPLACEMENT                                               Comment:hip   EYE SURGERY                                                     Comment:glaucoma & cataract   BACK SURGERY                                     03/2000         Comment:"ground calcium deposits from upper thoracic"               (01/26/2013)   TOTAL HIP ARTHROPLASTY                          Right 06/2007       CATARACT EXTRACTION                             Right ~ 2006       GLAUCOMA SURGERY                                Right ~ 2006         Comment:"put 3 stents in to drain fluid" (01/26/2013)   REFRACTIVE SURGERY                              Right ~ 2006         Comment:"twice; both done at [facility]" (01/26/2013)   SURGERY SCROTAL / TESTICULAR                    Right 1990's       INCISION AND DRAINAGE ABSCESS POSTERIOR CERVIC*  05/2012       CARDIAC CATHETERIZATION  1998         TEE WITHOUT CARDIOVERSION                       N/A 01/26/2013      Comment:Procedure: TRANSESOPHAGEAL ECHOCARDIOGRAM                (TEE);  Surgeon: Lelon Perla, MD;                Location: [facility];  Service:               Cardiovascular;  Laterality: N/A;  [name] anes.               /    CARDIOVERSION                                   N/A 01/26/2013      Comment:Procedure: CARDIOVERSION;  Surgeon: Lelon Perla, MD;  Location: [facility] ENDOSCOPY;                Service: Cardiovascular;  Laterality: N/A;  Review of patient's family history indicates:   Kidney disease                 Mother                   Heart disease                  Mother                   Diabetes                       Mother                   Cancer                         Father                     Comment: leukemia   Social History   Marital Status: Married             Spouse Name:                      Years of Education:                 Number of children:             Occupational History   None on file  Social History Main Topics   Smoking Status: Former Smoker                   Packs/Day: 3.00  Years: 61        Types: Cigarettes     Quit date: 09/28/2000   Smokeless Status: Never Used                       Alcohol Use: No             Drug Use: No             Sexual Activity: Not on file        Other Topics  Concern   None on file  Social History Narrative   None on file    ROS: See history of present illness otherwise negative  PHYSICAL EXAM: Well-nournished, in no acute distress. Neck: No JVD, HJR, Bruit, or thyroid enlargement  Lungs: No tachypnea, clear without wheezing, rales, or rhonchi  Cardiovascular: RRR, PMI not displaced, heart sounds normal, no murmurs, gallops, bruit, thrill, or heave.  Abdomen: BS normal. Soft without organomegaly, masses, lesions or tenderness.  Extremities: without cyanosis, clubbing or edema. Good distal pulses bilateral  SKin: Warm, no lesions or rashes   Musculoskeletal: No deformities  Neuro: no focal signs  Study Conclusions 2Decho  12/2012: - Left ventricle: Systolic function was moderately to   severely reduced. The estimated ejection fraction was in   the range of 30% to 35%. Diffuse hypokinesis. - Aortic valve: Mildly calcified annulus. - Mitral valve: No evidence of vegetation. Mild   regurgitation. - Left atrium: The atrium was moderately dilated. No   evidence of thrombus in the atrial cavity or appendage. - Atrial septum: No defect or patent foramen ovale was   identified. - Tricuspid valve: No evidence of vegetation. - Pulmonic valve: No evidence of vegetation. Transesophageal echocardiography.  2D and color Doppler. Height:  Height: 170.2cm. Height: 67in.  Weight:  Weight: 99.1kg. Weight: 218lb.  Body mass index:  BMI: 34.2kg/m^2. Body surface area:    BSA: 2.68m^2.  Blood pressure: 143/80.  Patient status:  Inpatient.  Location:  [facility].     BP 131/69  Pulse 61  Ht 5' 5.5" (1.664 m)  Wt 217 lb 6.4 oz (98.612 kg)  BMI 35.61 kg/m2   [id number] bradycardia with T wave inversion inferior and anterior laterally and complete left bundle branch block

## 2013-02-02 NOTE — Assessment & Plan Note (Addendum)
EF is now 30-35% a 2-D echo on 01/22/13. Hopefully this improves in normal sinus rhythm. Continue Lasix for now. Followup 2-D echo in the future. BMET today. 2 g sodium diet.

## 2013-02-12 ENCOUNTER — Ambulatory Visit (INDEPENDENT_AMBULATORY_CARE_PROVIDER_SITE_OTHER): Payer: Medicare Other

## 2013-02-12 DIAGNOSIS — Z7901 Long term (current) use of anticoagulants: Secondary | ICD-10-CM

## 2013-02-12 DIAGNOSIS — I4891 Unspecified atrial fibrillation: Secondary | ICD-10-CM

## 2013-02-12 LAB — CBC
HCT: 40.5 % (ref 39.0–52.0)
Hemoglobin: 13.4 g/dL (ref 13.0–17.0)
MCHC: 33.1 g/dL (ref 30.0–36.0)
MCV: 87.7 fl (ref 78.0–100.0)
Platelets: 120 10*3/uL — ABNORMAL LOW (ref 150.0–400.0)
RDW: 15.4 % — ABNORMAL HIGH (ref 11.5–14.6)

## 2013-02-12 LAB — BASIC METABOLIC PANEL
BUN: 12 mg/dL (ref 6–23)
CO2: 31 mEq/L (ref 19–32)
GFR: 78.94 mL/min (ref >60.00)
Glucose, Bld: 156 mg/dL — ABNORMAL HIGH (ref 70–99)
Potassium: 3.4 mEq/L — ABNORMAL LOW (ref 3.5–5.1)

## 2013-02-12 NOTE — Patient Instructions (Signed)

## 2013-02-12 NOTE — Progress Notes (Signed)
Pt was started on Xarelto 20mg  once daily for atrial fibrillation on 01/22/13 by Dr Johnsie Cancel.  Reviewed patients medication list.  Pt is not currently on any combined P-gp and strong CYP3A4 inhibitors/inducers (ketoconazole, traconazole, ritonavir, carbamazepine, phenytoin, rifampin, St. John's wort).  Reviewed labs.  SCr-1.0, [id number], CrCl-93.12.  Dose is appropriate based on CrCl.   Hgb and HCT 13.4/40.5 WNL on 02/13/13.  Called spoke with pt's wife on 02/13/13 advised labs WNL continue on Xarelto 20mg  QD.

## 2013-03-06 ENCOUNTER — Encounter: Payer: Self-pay | Admitting: Cardiovascular Disease

## 2013-03-06 ENCOUNTER — Ambulatory Visit (INDEPENDENT_AMBULATORY_CARE_PROVIDER_SITE_OTHER): Payer: Medicare Other | Admitting: Cardiovascular Disease

## 2013-03-06 VITALS — BP 148/58 | HR 72 | Ht 65.5 in | Wt 210.0 lb

## 2013-03-06 DIAGNOSIS — R9431 Abnormal electrocardiogram [ECG] [EKG]: Secondary | ICD-10-CM

## 2013-03-06 DIAGNOSIS — I5023 Acute on chronic systolic (congestive) heart failure: Secondary | ICD-10-CM

## 2013-03-06 DIAGNOSIS — I509 Heart failure, unspecified: Secondary | ICD-10-CM

## 2013-03-06 LAB — BASIC METABOLIC PANEL
Chloride: 106 mEq/L (ref 96–112)
GFR: 88.1 mL/min (ref >60.00)
Potassium: 3.5 mEq/L (ref 3.5–5.1)
Sodium: 143 mEq/L (ref 135–145)

## 2013-03-06 LAB — BRAIN NATRIURETIC PEPTIDE: Pro B Natriuretic peptide (BNP): 486 pg/mL — ABNORMAL HIGH (ref 0.0–100.0)

## 2013-03-06 NOTE — Assessment & Plan Note (Signed)
Improved.  Abnormla ECG and ? History of spasm  Needs lexiscan myovue to r/o CAD now that CHF is compensated Cannot walk on ETT well due to heel spurs

## 2013-03-06 NOTE — Assessment & Plan Note (Signed)
Cholesterol is at goal.  Continue current dose of statin and diet Rx.  No myalgias or side effects.  F/U  LFT's in 6 months. No results found for this basename: LDLCALC             

## 2013-03-06 NOTE — Assessment & Plan Note (Signed)
S/P DCC continue xarelto until next visit since he is not aware of rhythm

## 2013-03-06 NOTE — Patient Instructions (Addendum)
Your physician has requested that you have a lexiscan myoview. For further information please visit [email or web address]. Please follow instruction sheet, as given.  Your physician recommends that you return for lab work in: today  Your physician recommends that you schedule a follow-up appointment in: 3 months

## 2013-03-06 NOTE — Progress Notes (Signed)
Patient ID: Chad Brown, male   DOB: May 08, 1941, 72 y.o.   MRN: [id number] This is a 72 year old white male patient of who was found to be in rapid atrial fibrillation and heart failure l4/14 . He was admitted to the hospital and underwent TEE guided cardioversion. He is diuresed approximately 20 pounds since then and feels so much better. He denies any chest pain, palpitations, dyspnea, dyspnea on exertion, dizziness, or presyncope. 2-D echo in the hospital showed an EF of 30-35% with diffuse hypokinesis and moderately dilated LA and mild MR. He says he has a remote history of an MI.  Reviewed notes from 76  And no CAD identified ? Spasm and started on norvasc  Myovue back in 2008 was normal  He is unaware of rhythm Howver his dyspnea is much improved And CHF better.  Having some prostate issues Seeing Nyland  Had some obstructive uropathy back in 30 and saw Dr Elyse Jarvis  ROS: Denies fever, malais, weight loss, blurry vision, decreased visual acuity, cough, sputum, SOB, hemoptysis, pleuritic pain, palpitaitons, heartburn, abdominal pain, melena, lower extremity edema, claudication, or rash.  All other systems reviewed and negative  General: Affect appropriate Healthy:  appears stated age 55: normal Neck supple with no adenopathy JVP normal no bruits no thyromegaly Lungs clear with no wheezing and good diaphragmatic motion Heart:  S1/S2 no murmur, no rub, gallop or click PMI normal Abdomen: benighn, BS positve, no tenderness, no AAA no bruit.  No HSM or HJR Distal pulses intact with no bruits No edema Neuro non-focal Skin warm and dry No muscular weakness   Current Outpatient Prescriptions  Medication Sig Dispense Refill  . carvedilol (COREG) 6.25 MG tablet Take 1 tablet (6.25 mg total) by mouth 2 (two) times daily with a meal.  60 tablet  6  . dorzolamide-timolol (COSOPT) 22.3-6.8 MG/ML ophthalmic solution Place 1 drop into the right eye 2 (two) times daily.      . finasteride  (PROSCAR) 5 MG tablet Take 5 mg by mouth as directed.      . furosemide (LASIX) 20 MG tablet Take 1 tablet (20 mg total) by mouth daily.  30 tablet  6  . gabapentin (NEURONTIN) 100 MG capsule Take 200-300 mg by mouth 2 (two) times daily. Take 2 capsules in the morning and 3 capsules at night      . losartan (COZAAR) 50 MG tablet Take 1 tablet (50 mg total) by mouth daily.  30 tablet  6  . NIASPAN 500 MG CR tablet Take 1,000 mg by mouth at bedtime.       . Pitavastatin Calcium (LIVALO) 2 MG TABS Take 2 mg by mouth daily.      . potassium chloride SA (K-DUR,KLOR-CON) 20 MEQ tablet Take 0.5 tablets (10 mEq total) by mouth daily.      . Rivaroxaban (XARELTO) 20 MG TABS Take 1 tablet (20 mg total) by mouth daily with supper.  30 tablet  6  . tamsulosin (FLOMAX) 0.4 MG CAPS Take 0.4 mg by mouth daily.       No current facility-administered medications for this visit.    Allergies  Review of patient's allergies indicates no known allergies.  Electrocardiogram:  02/02/13  SR diffuse inferolateral T wave changes  Today NSR PAC sinus arrhythnmia inferolateral T wave changes persist  Assessment and Plan

## 2013-03-06 NOTE — Assessment & Plan Note (Signed)
Home readings are good May need to go up on cozaar in future

## 2013-03-18 ENCOUNTER — Telehealth: Payer: Self-pay | Admitting: Cardiovascular Disease

## 2013-03-18 ENCOUNTER — Ambulatory Visit (HOSPITAL_COMMUNITY): Payer: Medicare Other | Attending: Cardiology | Admitting: Radiology

## 2013-03-18 VITALS — BP 134/70 | Ht 65.0 in | Wt 210.0 lb

## 2013-03-18 DIAGNOSIS — R002 Palpitations: Secondary | ICD-10-CM | POA: Insufficient documentation

## 2013-03-18 DIAGNOSIS — E785 Hyperlipidemia, unspecified: Secondary | ICD-10-CM | POA: Insufficient documentation

## 2013-03-18 DIAGNOSIS — I252 Old myocardial infarction: Secondary | ICD-10-CM | POA: Insufficient documentation

## 2013-03-18 DIAGNOSIS — I4949 Other premature depolarization: Secondary | ICD-10-CM

## 2013-03-18 DIAGNOSIS — R0602 Shortness of breath: Secondary | ICD-10-CM | POA: Insufficient documentation

## 2013-03-18 DIAGNOSIS — R9431 Abnormal electrocardiogram [ECG] [EKG]: Secondary | ICD-10-CM | POA: Insufficient documentation

## 2013-03-18 DIAGNOSIS — Z87891 Personal history of nicotine dependence: Secondary | ICD-10-CM | POA: Insufficient documentation

## 2013-03-18 DIAGNOSIS — I4891 Unspecified atrial fibrillation: Secondary | ICD-10-CM | POA: Insufficient documentation

## 2013-03-18 DIAGNOSIS — I509 Heart failure, unspecified: Secondary | ICD-10-CM | POA: Insufficient documentation

## 2013-03-18 DIAGNOSIS — I5023 Acute on chronic systolic (congestive) heart failure: Secondary | ICD-10-CM

## 2013-03-18 DIAGNOSIS — I10 Essential (primary) hypertension: Secondary | ICD-10-CM | POA: Insufficient documentation

## 2013-03-18 MED ORDER — REGADENOSON 0.4 MG/5ML IV SOLN
0.4000 mg | Freq: Once | INTRAVENOUS | Status: AC
  Administered 2013-03-18: 0.4 mg via INTRAVENOUS

## 2013-03-18 MED ORDER — TECHNETIUM TC 99M SESTAMIBI GENERIC - CARDIOLITE
10.0000 | Freq: Once | INTRAVENOUS | Status: AC | PRN
  Administered 2013-03-18: 10 via INTRAVENOUS

## 2013-03-18 MED ORDER — TECHNETIUM TC 99M SESTAMIBI GENERIC - CARDIOLITE
30.0000 | Freq: Once | INTRAVENOUS | Status: AC | PRN
  Administered 2013-03-18: 30 via INTRAVENOUS

## 2013-03-18 NOTE — Progress Notes (Signed)
[facility] [address] [place],  [address] [phone]    Cardiology Nuclear Med Study  Chad Brown is a 72 y.o. male     MRN : [id number]     DOB: 01/01/1941  Procedure Date: 03/18/2013  Nuclear Med Background Indication for Stress Test:  Evaluation for Ischemia and Abnormal EKG History:  AFIB, CHF, '98 MI-Heart Cath-Nl, '08 MPS: NL No Report, 4/14 Cardioversion AFIB RVR  01/26/13 ECHO: EF 30-35% Cardiac Risk Factors: History of Smoking, Hypertension and Lipids  Symptoms:  Palpitations and SOB   Nuclear Pre-Procedure Caffeine/Decaff Intake:  None NPO After: 6:30 pm   Lungs:  clear O2 Sat: 97% on room air. IV 0.9% NS with Angio Cath:  22g  IV Site: R Antecubital  IV Started by:  Crissie Figures, RN  Chest Size (in):  48 Cup Size: n/a  Height: 5\' 5"  (1.651 m)  Weight:  210 lb (95.255 kg)  BMI:  Body mass index is 34.95 kg/(m^2). Tech Comments:  Patient held Coreg and Lasix this AM    Nuclear Med Study 1 or 2 day study: 1 day  Stress Test Type:  Lexiscan  Reading MD: Loralie Champagne, MD  Order Authorizing Provider:  [name], MD  Resting Radionuclide: Technetium 55m Sestamibi  Resting Radionuclide Dose: 11.0 mCi   Stress Radionuclide:  Technetium 28m Sestamibi  Stress Radionuclide Dose: 33.0 mCi           Stress Protocol Rest HR: 44 Stress HR: 71  Rest BP: 134/70 Stress BP: 148/71  Exercise Time (min): n/a METS: n/a   Predicted Max HR: 148 bpm % Max HR: 47.97 bpm Rate Pressure Product: 10508   Dose of Adenosine (mg):  n/a Dose of Lexiscan: 0.4 mg  Dose of Atropine (mg): n/a Dose of Dobutamine: n/a mcg/kg/min (at max HR)  Stress Test Technologist: Perrin Maltese, EMT-P  Nuclear Technologist:  Charlton Amor, CNMT     Rest Procedure:  Myocardial perfusion imaging was performed at rest 45 minutes following the intravenous administration of Technetium 62m Sestamibi. Rest ECG: NSR with baseline inferolateral T wave  inversions.   Stress Procedure:  The patient received IV Lexiscan 0.4 mg over 15-seconds.  Technetium 12m Sestamibi injected at 30-seconds. This patient's head felt funny with the Lexiscan injection. Quantitative spect images were obtained after a 45 minute delay. Stress ECG: No change from baseline.   QPS Raw Data Images:  Normal; no motion artifact; normal heart/lung ratio. Stress Images:  Normal homogeneous uptake in all areas of the myocardium. Rest Images:  Normal homogeneous uptake in all areas of the myocardium. Subtraction (SDS):  There is no evidence of scar or ischemia. Transient Ischemic Dilatation (Normal <1.22):  0.98 Lung/Heart Ratio (Normal <0.45):  0.27  Quantitative Gated Spect Images QGS EDV:  n/a QGS ESV:  n/a  Impression Exercise Capacity:  Lexiscan with no exercise. BP Response:  Normal blood pressure response. Clinical Symptoms:  head "feels funny" ECG Impression:  Baseline abnormal ECG with inferolateral T wave inversions, no significant change.  Comparison with Prior Nuclear Study: No significant change from previous study  Overall Impression:  Normal stress nuclear study.  LV Ejection Fraction: Study not gated.  LV Wall Motion:  Study not gated  [employer] 03/18/2013

## 2013-03-18 NOTE — Telephone Encounter (Signed)
Walk in Pt Form " pt Needs Samples" Sent to Message Nurse  03/18/13/KM

## 2013-03-20 ENCOUNTER — Other Ambulatory Visit: Payer: Self-pay | Admitting: *Deleted

## 2013-03-20 DIAGNOSIS — Z79899 Other long term (current) drug therapy: Secondary | ICD-10-CM

## 2013-05-15 ENCOUNTER — Other Ambulatory Visit: Payer: Medicare Other

## 2013-06-04 ENCOUNTER — Other Ambulatory Visit (HOSPITAL_COMMUNITY): Payer: Self-pay | Admitting: Family Medicine

## 2013-06-04 DIAGNOSIS — C6991 Malignant neoplasm of unspecified site of right eye: Secondary | ICD-10-CM

## 2013-06-05 ENCOUNTER — Other Ambulatory Visit (HOSPITAL_COMMUNITY): Payer: Self-pay | Admitting: Family Medicine

## 2013-06-05 ENCOUNTER — Ambulatory Visit (HOSPITAL_COMMUNITY)
Admission: RE | Admit: 2013-06-05 | Discharge: 2013-06-05 | Disposition: A | Discharge status: Home / Self Care | Payer: Medicare Other | Source: Ambulatory Visit | Attending: Family Medicine | Admitting: Family Medicine

## 2013-06-05 ENCOUNTER — Encounter (HOSPITAL_COMMUNITY): Payer: Self-pay

## 2013-06-05 DIAGNOSIS — R599 Enlarged lymph nodes, unspecified: Secondary | ICD-10-CM | POA: Insufficient documentation

## 2013-06-05 DIAGNOSIS — C6991 Malignant neoplasm of unspecified site of right eye: Secondary | ICD-10-CM

## 2013-06-05 DIAGNOSIS — I700 Atherosclerosis of aorta: Secondary | ICD-10-CM | POA: Insufficient documentation

## 2013-06-05 DIAGNOSIS — K8020 Calculus of gallbladder without cholecystitis without obstruction: Secondary | ICD-10-CM | POA: Insufficient documentation

## 2013-06-05 DIAGNOSIS — C799 Secondary malignant neoplasm of unspecified site: Secondary | ICD-10-CM

## 2013-06-05 DIAGNOSIS — C6990 Malignant neoplasm of unspecified site of unspecified eye: Secondary | ICD-10-CM | POA: Insufficient documentation

## 2013-06-05 LAB — CREATININE, SERUM
Creatinine, Ser: 0.96 mg/dL (ref 0.50–1.35)
GFR calc Af Amer: >90 mL/min (ref >90)
GFR calc non Af Amer: 81 mL/min — ABNORMAL LOW (ref >90)

## 2013-06-05 MED ORDER — IOHEXOL 300 MG/ML  SOLN
100.0000 mL | Freq: Once | INTRAMUSCULAR | Status: AC | PRN
  Administered 2013-06-05: 100 mL via INTRAVENOUS

## 2013-06-08 ENCOUNTER — Ambulatory Visit (INDEPENDENT_AMBULATORY_CARE_PROVIDER_SITE_OTHER): Payer: Medicare Other | Admitting: Cardiovascular Disease

## 2013-06-08 ENCOUNTER — Encounter: Payer: Self-pay | Admitting: Cardiovascular Disease

## 2013-06-08 VITALS — BP 150/80 | HR 78 | Wt 212.0 lb

## 2013-06-08 DIAGNOSIS — I10 Essential (primary) hypertension: Secondary | ICD-10-CM

## 2013-06-08 DIAGNOSIS — I426 Alcoholic cardiomyopathy: Secondary | ICD-10-CM

## 2013-06-08 DIAGNOSIS — E785 Hyperlipidemia, unspecified: Secondary | ICD-10-CM

## 2013-06-08 DIAGNOSIS — I4891 Unspecified atrial fibrillation: Secondary | ICD-10-CM

## 2013-06-08 MED ORDER — LOSARTAN POTASSIUM 100 MG PO TABS: 100.0000 mg | ORAL_TABLET | Freq: Every day | ORAL | Status: DC

## 2013-06-08 NOTE — Assessment & Plan Note (Signed)
Maint NSR  Continue xarelto  Samples given

## 2013-06-08 NOTE — Assessment & Plan Note (Signed)
Increase Hyzaar to 100 mg  Low sodium diet

## 2013-06-08 NOTE — Patient Instructions (Signed)
Your physician wants you to follow-up in:  [place] will receive a reminder letter in the mail two months in advance. If you don't receive a letter, please call our office to schedule the follow-up appointment. Your physician has recommended you make the following change in your medication:  INCREASE LOSARTAN TO 100 MG EVERY DAY

## 2013-06-08 NOTE — Assessment & Plan Note (Signed)
Cholesterol is at goal.  Continue current dose of statin and diet Rx.  No myalgias or side effects.  F/U  LFT's in 6 months. No results found for this basename: LDLCALC             

## 2013-06-08 NOTE — Assessment & Plan Note (Signed)
Euvolemic  Continue current meds

## 2013-06-08 NOTE — Progress Notes (Signed)
Patient ID: Chad Brown, male   DOB: August 16, 1941, 72 y.o.   MRN: [id number] This is a 72 year old white male patient of who was found to be in rapid atrial fibrillation and heart failure l4/14 . He was admitted to the hospital and underwent TEE guided cardioversion. He is diuresed approximately 20 pounds since then and feels so much better. He denies any chest pain, palpitations, dyspnea, dyspnea on exertion, dizziness, or presyncope. 2-D echo in the hospital showed an EF of 30-35% with diffuse hypokinesis and moderately dilated LA and mild MR. He says he has a remote history of an MI.  Reviewed notes from 54 And no CAD identified ? Spasm and started on norvasc Myovue back in 2008 was normal He is unaware of rhythm Howver his dyspnea is much improved  And CHF better. Having some prostate issues Seeing Nyland Had some obstructive uropathy back in 78 and saw Dr Lupita Dawn 03/19/13 reviewed and normal.  Not gated so no EF but no evidence of CAD/ Infarct or ischemia  History of melanoma  Had CT last week I reviewed no mets  ? Pancreatic abnormality that needs f/u  ROS: Denies fever, malais, weight loss, blurry vision, decreased visual acuity, cough, sputum, SOB, hemoptysis, pleuritic pain, palpitaitons, heartburn, abdominal pain, melena, lower extremity edema, claudication, or rash.  All other systems reviewed and negative  General: Affect appropriate Healthy:  appears stated age 64: normal Neck supple with no adenopathy JVP normal no bruits no thyromegaly Lungs clear with no wheezing and good diaphragmatic motion Heart:  S1/S2 no murmur, no rub, gallop or click PMI normal Abdomen: benighn, BS positve, no tenderness, no AAA no bruit.  No HSM or HJR Distal pulses intact with no bruits No edema Neuro non-focal Skin warm and dry No muscular weakness   Current Outpatient Prescriptions  Medication Sig Dispense Refill  . carvedilol (COREG) 6.25 MG tablet Take 1 tablet (6.25 mg total) by  mouth 2 (two) times daily with a meal.  60 tablet  6  . dorzolamide-timolol (COSOPT) 22.3-6.8 MG/ML ophthalmic solution Place 1 drop into the right eye 2 (two) times daily.      . finasteride (PROSCAR) 5 MG tablet Take 5 mg by mouth as directed.      . furosemide (LASIX) 20 MG tablet Take 1 tablet (20 mg total) by mouth daily.  30 tablet  6  . gabapentin (NEURONTIN) 100 MG capsule Take 2 capsules in the morning and 3 capsules at night      . losartan (COZAAR) 50 MG tablet Take 1 tablet (50 mg total) by mouth daily.  30 tablet  6  . NIASPAN 500 MG CR tablet Take 1,000 mg by mouth at bedtime.       . Pitavastatin Calcium (LIVALO) 2 MG TABS Take 2 mg by mouth daily.      . potassium chloride SA (K-DUR,KLOR-CON) 20 MEQ tablet Take 20 mEq by mouth daily.      . Rivaroxaban (XARELTO) 20 MG TABS Take 1 tablet (20 mg total) by mouth daily with supper.  30 tablet  6  . tamsulosin (FLOMAX) 0.4 MG CAPS Take 0.4 mg by mouth daily.       No current facility-administered medications for this visit.    Allergies  Review of patient's allergies indicates no known allergies.  Electrocardiogram:  03/06/13  SR rate 62  Lateral T wave inversions   Assessment and Plan

## 2013-08-21 ENCOUNTER — Other Ambulatory Visit (HOSPITAL_COMMUNITY): Payer: Self-pay | Admitting: Physician Assistant

## 2013-08-28 ENCOUNTER — Other Ambulatory Visit (HOSPITAL_COMMUNITY): Payer: Self-pay | Admitting: Physician Assistant

## 2013-10-01 DIAGNOSIS — C4370 Malignant melanoma of unspecified lower limb, including hip: Secondary | ICD-10-CM

## 2013-10-01 HISTORY — DX: Malignant melanoma of unspecified lower limb, including hip: C43.70

## 2013-10-01 HISTORY — PX: MELANOMA EXCISION: SHX5266

## 2013-12-11 ENCOUNTER — Ambulatory Visit (INDEPENDENT_AMBULATORY_CARE_PROVIDER_SITE_OTHER): Payer: Medicare Other | Admitting: Cardiovascular Disease

## 2013-12-11 VITALS — BP 134/88 | HR 72 | Ht 66.0 in | Wt 216.4 lb

## 2013-12-11 DIAGNOSIS — I10 Essential (primary) hypertension: Secondary | ICD-10-CM

## 2013-12-11 DIAGNOSIS — I4891 Unspecified atrial fibrillation: Secondary | ICD-10-CM

## 2013-12-11 DIAGNOSIS — I426 Alcoholic cardiomyopathy: Secondary | ICD-10-CM

## 2013-12-11 DIAGNOSIS — E785 Hyperlipidemia, unspecified: Secondary | ICD-10-CM

## 2013-12-11 NOTE — Assessment & Plan Note (Signed)
Euvolemic working out at [employer] 3x/week  Stable continue current meds  MRI for EF in a year

## 2013-12-11 NOTE — Assessment & Plan Note (Signed)
Maint NSR on anticoagulation samples of xarelto given

## 2013-12-11 NOTE — Assessment & Plan Note (Signed)
Well controlled.  Continue current medications and low sodium Dash type diet.    

## 2013-12-11 NOTE — Progress Notes (Signed)
Patient ID: Chad Brown, male   DOB: Jun 01, 1941, 73 y.o.   MRN: [id number] This is a 73 year old white male patient of who was found to be in rapid atrial fibrillation and heart failure l4/14 . He was admitted to the hospital and underwent TEE guided cardioversion. He is diuresed approximately 20 pounds since then and feels so much better. He denies any chest pain, palpitations, dyspnea, dyspnea on exertion, dizziness, or presyncope. 2-D echo in the hospital showed an EF of 30-35% with diffuse hypokinesis and moderately dilated LA and mild MR. He says he has a remote history of an MI.  Reviewed notes from 73 And no CAD identified ? Spasm and started on norvasc Myovue back in 2008 was normal He is unaware of rhythm Howver his dyspnea is much improved  And CHF better. Having some prostate issues Seeing Nyland Had some obstructive uropathy back in 19 and saw Dr Lupita Dawn 03/19/13 reviewed and normal. Not gated so no EF but no evidence of CAD/ Infarct or ischemia  History of melanoma Had CT last week I reviewed no mets ? Pancreatic abnormality that needs f/u  03/09/13  Normal myovue not gated     ROS: Denies fever, malais, weight loss, blurry vision, decreased visual acuity, cough, sputum, SOB, hemoptysis, pleuritic pain, palpitaitons, heartburn, abdominal pain, melena, lower extremity edema, claudication, or rash.  All other systems reviewed and negative  General: Affect appropriate Healthy:  appears stated age 5: normal Neck supple with no adenopathy JVP normal no bruits no thyromegaly Lungs clear with no wheezing and good diaphragmatic motion Heart:  S1/S2 no murmur, no rub, gallop or click PMI normal Abdomen: benighn, BS positve, no tenderness, no AAA no bruit.  No HSM or HJR Distal pulses intact with no bruits No edema Neuro non-focal Skin warm and dry No muscular weakness   Current Outpatient Prescriptions  Medication Sig Dispense Refill  . carvedilol (COREG) 6.25 MG  tablet TAKE 1 TABLET TWICE DAILY WITH BREAKFAST & SUPPER  60 tablet  3  . dorzolamide-timolol (COSOPT) 22.3-6.8 MG/ML ophthalmic solution Place 1 drop into the right eye 2 (two) times daily.      . finasteride (PROSCAR) 5 MG tablet Take 5 mg by mouth as directed.      . furosemide (LASIX) 20 MG tablet TAKE 1 TABLET ONCE A DAY  30 tablet  3  . gabapentin (NEURONTIN) 100 MG capsule Take 2 capsules in the morning and 3 capsules at night      . losartan (COZAAR) 100 MG tablet Take 1 tablet (100 mg total) by mouth daily.  30 tablet  11  . NIASPAN 500 MG CR tablet Take 1,000 mg by mouth at bedtime.       . Pitavastatin Calcium (LIVALO) 2 MG TABS Take 2 mg by mouth daily.      . potassium chloride SA (K-DUR,KLOR-CON) 20 MEQ tablet Take 20 mEq by mouth daily.      . Rivaroxaban (XARELTO) 20 MG TABS Take 1 tablet (20 mg total) by mouth daily with supper.  30 tablet  6  . tamsulosin (FLOMAX) 0.4 MG CAPS Take 0.4 mg by mouth daily.       No current facility-administered medications for this visit.    Allergies  Review of patient's allergies indicates no known allergies.  Electrocardiogram:  SR marked inferolateral T wave inversions   Assessment and Plan

## 2013-12-11 NOTE — Patient Instructions (Signed)
Your physician wants you to follow-up in:  6 MONTHS WITH DR NISHAN  You will receive a reminder letter in the mail two months in advance. If you don't receive a letter, please call our office to schedule the follow-up appointment. Your physician recommends that you continue on your current medications as directed. Please refer to the Current Medication list given to you today. 

## 2013-12-11 NOTE — Assessment & Plan Note (Signed)
Cholesterol is at goal.  Continue current dose of statin and diet Rx.  No myalgias or side effects.  F/U  LFT's in 6 months. No results found for this basename: LDLCALC             

## 2013-12-15 ENCOUNTER — Other Ambulatory Visit (HOSPITAL_COMMUNITY): Payer: Self-pay | Admitting: Family Medicine

## 2013-12-15 DIAGNOSIS — C6930 Malignant neoplasm of unspecified choroid: Secondary | ICD-10-CM

## 2013-12-18 ENCOUNTER — Encounter (HOSPITAL_COMMUNITY): Payer: Self-pay

## 2013-12-18 ENCOUNTER — Ambulatory Visit (HOSPITAL_COMMUNITY)
Admission: RE | Admit: 2013-12-18 | Discharge: 2013-12-18 | Disposition: A | Discharge status: Home / Self Care | Payer: Medicare Other | Source: Ambulatory Visit | Attending: Family Medicine | Admitting: Family Medicine

## 2013-12-18 DIAGNOSIS — C6930 Malignant neoplasm of unspecified choroid: Secondary | ICD-10-CM | POA: Insufficient documentation

## 2013-12-18 LAB — POCT I-STAT CREATININE: CREATININE: 1 mg/dL (ref 0.50–1.35)

## 2013-12-18 MED ORDER — IOHEXOL 300 MG/ML  SOLN
25.0000 mL | INTRAMUSCULAR | Status: AC
  Administered 2013-12-18: 25 mL via ORAL

## 2013-12-18 MED ORDER — IOHEXOL 300 MG/ML  SOLN
100.0000 mL | Freq: Once | INTRAMUSCULAR | Status: AC | PRN
  Administered 2013-12-18: 100 mL via INTRAVENOUS

## 2013-12-23 ENCOUNTER — Other Ambulatory Visit (HOSPITAL_COMMUNITY): Payer: Self-pay | Admitting: Cardiovascular Disease

## 2013-12-25 ENCOUNTER — Other Ambulatory Visit (HOSPITAL_COMMUNITY): Payer: Self-pay | Admitting: Cardiovascular Disease

## 2014-01-26 ENCOUNTER — Other Ambulatory Visit: Payer: Self-pay | Admitting: *Deleted

## 2014-01-26 MED ORDER — FUROSEMIDE 20 MG PO TABS: ORAL_TABLET | ORAL | Status: DC

## 2014-03-18 ENCOUNTER — Telehealth: Payer: Self-pay | Admitting: Cardiovascular Disease

## 2014-03-18 NOTE — Telephone Encounter (Signed)
Gave [facility] date of last echo and EF%.

## 2014-03-18 NOTE — Telephone Encounter (Signed)
New Message:  Chad Brown is calling to find out the date of the pt's last echo and his ejection fraction

## 2014-04-04 ENCOUNTER — Other Ambulatory Visit: Payer: Self-pay | Admitting: Cardiovascular Disease

## 2014-04-16 ENCOUNTER — Other Ambulatory Visit (HOSPITAL_COMMUNITY): Payer: Self-pay | Admitting: Cardiovascular Disease

## 2014-06-09 ENCOUNTER — Other Ambulatory Visit: Payer: Self-pay | Admitting: Cardiovascular Disease

## 2014-06-11 ENCOUNTER — Encounter: Payer: Self-pay | Admitting: Cardiovascular Disease

## 2014-06-11 ENCOUNTER — Ambulatory Visit (INDEPENDENT_AMBULATORY_CARE_PROVIDER_SITE_OTHER): Payer: Medicare Other | Admitting: Cardiovascular Disease

## 2014-06-11 VITALS — BP 142/88 | HR 56 | Ht 66.0 in | Wt 215.0 lb

## 2014-06-11 DIAGNOSIS — I10 Essential (primary) hypertension: Secondary | ICD-10-CM

## 2014-06-11 DIAGNOSIS — I4891 Unspecified atrial fibrillation: Secondary | ICD-10-CM

## 2014-06-11 DIAGNOSIS — E785 Hyperlipidemia, unspecified: Secondary | ICD-10-CM

## 2014-06-11 DIAGNOSIS — I426 Alcoholic cardiomyopathy: Secondary | ICD-10-CM

## 2014-06-11 DIAGNOSIS — I482 Chronic atrial fibrillation, unspecified: Secondary | ICD-10-CM

## 2014-06-11 NOTE — Patient Instructions (Signed)
**Note De-Identified Chad Brown Obfuscation** Your physician recommends that you continue on your current medications as directed. Please refer to the Current Medication list given to you today.  Your physician recommends that you schedule a follow-up appointment on his next available spot.

## 2014-06-11 NOTE — Progress Notes (Signed)
Patient ID: Chad Brown, male   DOB: 11-Jul-1941, 73 y.o.   MRN: [id number] This is a 73 year old white male patient of who was found to be in rapid atrial fibrillation and heart failure 4/14 . He was admitted to the hospital and underwent TEE guided cardioversion. He is diuresed approximately 20 pounds since then and feels so much better. He denies any chest pain, palpitations, dyspnea, dyspnea on exertion, dizziness, or presyncope. 2-D echo in the hospital showed an EF of 30-35% with diffuse hypokinesis and moderately dilated LA and mild MR. He says he has a remote history of an MI.  Reviewed notes from 55 And no CAD identified ? Spasm and started on norvasc Myovue back in 2008 was normal He is unaware of rhythm Howver his dyspnea is much improved  And CHF better. Having some prostate issues Seeing N[place] Had some obstructive uropathy back in 63 and saw Dr Lupita Dawn 03/19/13 reviewed and normal. Not gated so no EF but no evidence of CAD/ Infarct or ischemia  History of melanoma Had CT last week I reviewed no mets ? Pancreatic abnormality that needs f/u  03/09/13 Normal myovue not gated    Spreading melanoma left thigh  Seeing Dr Georges Mouse  To have surgery  Discussed with him and will do it while anticoagulated Along with lymphangiography   ROS: Denies fever, malais, weight loss, blurry vision, decreased visual acuity, cough, sputum, SOB, hemoptysis, pleuritic pain, palpitaitons, heartburn, abdominal pain, melena, lower extremity edema, claudication, or rash.  All other systems reviewed and negative  General: Affect appropriate Healthy:  appears stated age 73: normal Neck supple with no adenopathy JVP normal no bruits no thyromegaly Lungs clear with no wheezing and good diaphragmatic motion Heart:  S1/S2 SEM  murmur, no rub, gallop or click PMI normal Abdomen: benighn, BS positve, no tenderness, no AAA no bruit.  No HSM or HJR Distal pulses intact with no bruits No edema Neuro  non-focal Skin warm and dry Recent biopsy inside left thigh  No muscular weakness   Current Outpatient Prescriptions  Medication Sig Dispense Refill  . carvedilol (COREG) 6.25 MG tablet TAKE 1 TABLET TWICE DAILY WITH BREAKFAST & SUPPER  60 tablet  4  . dorzolamide-timolol (COSOPT) 22.3-6.8 MG/ML ophthalmic solution Place 1 drop into the right eye 2 (two) times daily.      . finasteride (PROSCAR) 5 MG tablet Take 5 mg by mouth as directed.      . furosemide (LASIX) 20 MG tablet TAKE 1 TABLET ONCE A DAY  90 tablet  1  . gabapentin (NEURONTIN) 100 MG capsule Take 2 capsules in the morning and 3 capsules at night      . losartan (COZAAR) 100 MG tablet TAKE 1 TABLET DAILY  30 tablet  0  . NIASPAN 500 MG CR tablet Take 1,000 mg by mouth at bedtime.       . Pitavastatin Calcium (LIVALO) 4 MG TABS Take 1 tablet by mouth daily.      . potassium chloride SA (K-DUR,KLOR-CON) 20 MEQ tablet Take 20 mEq by mouth daily.      . Rivaroxaban (XARELTO) 20 MG TABS Take 1 tablet (20 mg total) by mouth daily with supper.  30 tablet  6  . tamsulosin (FLOMAX) 0.4 MG CAPS Take 0.4 mg by mouth daily.       No current facility-administered medications for this visit.    Allergies  Review of patient's allergies indicates no known allergies.  Electrocardiogram:  SR  LAD marked inferolateral T wave changes chronic   Assessment and Plan

## 2014-06-12 NOTE — Assessment & Plan Note (Signed)
Good rate control  Continue xarelto for skin surgery at [facility]

## 2014-06-12 NOTE — Assessment & Plan Note (Signed)
Cholesterol is at goal.  Continue current dose of statin and diet Rx.  No myalgias or side effects.  F/U  LFT's in 6 months. No results found for this basename: LDLCALC             

## 2014-06-12 NOTE — Assessment & Plan Note (Signed)
Well controlled.  Continue current medications and low sodium Dash type diet.    

## 2014-06-12 NOTE — Assessment & Plan Note (Signed)
Euvolemic  Continue current dose of diuretic

## 2014-07-09 ENCOUNTER — Other Ambulatory Visit: Payer: Self-pay | Admitting: Cardiovascular Disease

## 2014-07-21 ENCOUNTER — Ambulatory Visit (INDEPENDENT_AMBULATORY_CARE_PROVIDER_SITE_OTHER): Payer: Medicare Other | Admitting: Cardiovascular Disease

## 2014-07-21 ENCOUNTER — Encounter: Payer: Self-pay | Admitting: Cardiovascular Disease

## 2014-07-21 VITALS — BP 142/84 | HR 55 | Ht 67.0 in | Wt 218.0 lb

## 2014-07-21 DIAGNOSIS — C4490 Unspecified malignant neoplasm of skin, unspecified: Secondary | ICD-10-CM | POA: Insufficient documentation

## 2014-07-21 DIAGNOSIS — I10 Essential (primary) hypertension: Secondary | ICD-10-CM

## 2014-07-21 DIAGNOSIS — E785 Hyperlipidemia, unspecified: Secondary | ICD-10-CM

## 2014-07-21 DIAGNOSIS — I4891 Unspecified atrial fibrillation: Secondary | ICD-10-CM

## 2014-07-21 DIAGNOSIS — I426 Alcoholic cardiomyopathy: Secondary | ICD-10-CM

## 2014-07-21 NOTE — Assessment & Plan Note (Signed)
Cholesterol is at goal.  Continue current dose of statin and diet Rx.  No myalgias or side effects.  F/U  LFT's in 6 months. No results found for this basename: LDLCALC  Labs with primary            

## 2014-07-21 NOTE — Progress Notes (Signed)
Patient ID: MARKAL CEDILLOS, male   DOB: 10/15/1940, 73 y.o.   MRN: [id number] This is a 73 year old white male patient of who was found to be in rapid atrial fibrillation and heart failure 4/14 . He was admitted to the hospital and underwent TEE guided cardioversion. He is diuresed approximately 20 pounds since then and feels so much better. He denies any chest pain, palpitations, dyspnea, dyspnea on exertion, dizziness, or presyncope. 2-D echo in the hospital showed an EF of 30-35% with diffuse hypokinesis and moderately dilated LA and mild MR. He says he has a remote history of an MI.  Reviewed notes from 17 And no CAD identified ? Spasm and started on norvasc Myovue back in 2008 was normal He is unaware of rhythm Howver his dyspnea is much improved  And CHF better. Having some prostate issues Seeing Nyland Had some obstructive uropathy back in 49 and saw Dr Lupita Dawn 03/19/13 reviewed and normal. Not gated so no EF but no evidence of CAD/ Infarct or ischemia  History of melanoma Had CT last week I reviewed no mets ? Pancreatic abnormality that needs f/u  03/09/13 Normal myovue not gated  Spreading melanoma left thigh Seeing Dr Georges Mouse had surgery and lymphangiogram was ok     ROS: Denies fever, malais, weight loss, blurry vision, decreased visual acuity, cough, sputum, SOB, hemoptysis, pleuritic pain, palpitaitons, heartburn, abdominal pain, melena, lower extremity edema, claudication, or rash.  All other systems reviewed and negative  General: Affect appropriate Healthy:  appears stated age 89: normal Neck supple with no adenopathy JVP normal no bruits no thyromegaly Lungs clear with no wheezing and good diaphragmatic motion Heart:  S1/S2 no murmur, no rub, gallop or click PMI normal Abdomen: benighn, BS positve, no tenderness, no AAA no bruit.  No HSM or HJR Distal pulses intact with no bruits No edema Neuro non-focal Skin incision left thigh with edge dehiscense no infection  ? Retained suture No muscular weakness   Current Outpatient Prescriptions  Medication Sig Dispense Refill  . carvedilol (COREG) 6.25 MG tablet TAKE 1 TABLET TWICE DAILY WITH BREAKFAST & SUPPER  60 tablet  4  . dorzolamide-timolol (COSOPT) 22.3-6.8 MG/ML ophthalmic solution Place 1 drop into the right eye 2 (two) times daily.      . finasteride (PROSCAR) 5 MG tablet Take 5 mg by mouth as directed.      . furosemide (LASIX) 20 MG tablet TAKE 1 TABLET ONCE A DAY  90 tablet  1  . gabapentin (NEURONTIN) 100 MG capsule Take 2 capsules in the morning and 3 capsules at night      . losartan (COZAAR) 100 MG tablet TAKE 1 TABLET DAILY  30 tablet  11  . NIASPAN 500 MG CR tablet Take 1,000 mg by mouth at bedtime.       . Pitavastatin Calcium (LIVALO) 4 MG TABS Take 1 tablet by mouth daily.      . potassium chloride SA (K-DUR,KLOR-CON) 20 MEQ tablet Take 20 mEq by mouth daily.      . Rivaroxaban (XARELTO) 20 MG TABS Take 1 tablet (20 mg total) by mouth daily with supper.  30 tablet  6  . tamsulosin (FLOMAX) 0.4 MG CAPS Take 0.4 mg by mouth daily.       No current facility-administered medications for this visit.    Allergies  Review of patient's allergies indicates no known allergies.  Electrocardiogram:  6/14  SR inferolateral T wave inversions  Today slower rate  55 persistent inferolateral T wave inversion PAC  Assessment and Plan

## 2014-07-21 NOTE — Assessment & Plan Note (Signed)
EF improved no active CHF Euvolemic continue current meds

## 2014-07-21 NOTE — Assessment & Plan Note (Signed)
Maint NSR no palpitatoins samples of xarelto given

## 2014-07-21 NOTE — Assessment & Plan Note (Signed)
Well controlled.  Continue current medications and low sodium Dash type diet.    

## 2014-07-21 NOTE — Assessment & Plan Note (Signed)
Edge dehiscence ? Retained suture Will f/u with dermatologist this week Bacitracin and telfa pad Keep covered

## 2014-07-21 NOTE — Patient Instructions (Signed)
Your physician wants you to follow-up in:  6 MONTHS WITH DR NISHAN  You will receive a reminder letter in the mail two months in advance. If you don't receive a letter, please call our office to schedule the follow-up appointment. Your physician recommends that you continue on your current medications as directed. Please refer to the Current Medication list given to you today. 

## 2014-07-27 ENCOUNTER — Other Ambulatory Visit: Payer: Self-pay | Admitting: Cardiovascular Disease

## 2014-09-08 ENCOUNTER — Other Ambulatory Visit: Payer: Self-pay

## 2014-09-08 MED ORDER — RIVAROXABAN 20 MG PO TABS: 20.0000 mg | ORAL_TABLET | Freq: Every day | ORAL | Status: DC

## 2014-09-09 ENCOUNTER — Telehealth: Payer: Self-pay | Admitting: *Deleted

## 2014-09-09 NOTE — Telephone Encounter (Signed)
PA for Xarelto sent via CoverMyMeds 

## 2014-09-10 NOTE — Telephone Encounter (Signed)
PA for Xarelto approved through 09/10/2015.

## 2014-09-15 ENCOUNTER — Other Ambulatory Visit: Payer: Self-pay | Admitting: Cardiovascular Disease

## 2014-09-22 ENCOUNTER — Telehealth: Payer: Self-pay | Admitting: Cardiovascular Disease

## 2014-09-22 NOTE — Telephone Encounter (Signed)
Pt came into the office and completed walk-in form.  The pt would like to know if he needs to come off Xarelto before cortisone injection next week.  I spoke with the pt and he is not scheduled for injection at this time.  I made the pt aware that the MD who is doing injection needs to determine if Xarelto has to be held.  If Xarelto needs to be held then the MD office needs to contact our office for clearance to hold medication.  Pt verbalized understanding.

## 2014-09-22 NOTE — Telephone Encounter (Signed)
Walk-In Patient form received on 12.23.15: taken to triage on 12.23.15:djc

## 2014-09-27 ENCOUNTER — Other Ambulatory Visit: Payer: Self-pay | Admitting: Orthopedic Surgery

## 2014-09-27 DIAGNOSIS — M25552 Pain in left hip: Secondary | ICD-10-CM

## 2014-09-27 DIAGNOSIS — M1612 Unilateral primary osteoarthritis, left hip: Secondary | ICD-10-CM

## 2014-09-28 ENCOUNTER — Telehealth: Payer: Self-pay | Admitting: Cardiovascular Disease

## 2014-09-28 NOTE — Telephone Encounter (Signed)
New Msg      Chad Brown of [facility] calling needs pt to hold Xarelto for one day for injections.   Please reach her at  [phone].

## 2014-09-29 NOTE — Telephone Encounter (Signed)
LEFT MESSAGE THAT ITS   OKAY FOR PT  TO HOLD  [facility]  1 DAY ./CY

## 2014-09-29 NOTE — Telephone Encounter (Signed)
Ok to hold xarelto for a day

## 2014-09-29 NOTE — Telephone Encounter (Signed)
WILL FORWARD TO  DR NISHAN./CY

## 2014-10-02 ENCOUNTER — Emergency Department (HOSPITAL_COMMUNITY): Payer: Medicare Other

## 2014-10-02 ENCOUNTER — Inpatient Hospital Stay (HOSPITAL_COMMUNITY)
Admission: EM | Admit: 2014-10-02 | Discharge: 2014-10-08 | DRG: 292 | Disposition: A | Discharge status: Home / Self Care | Payer: Medicare Other | Attending: Internal Medicine | Admitting: Internal Medicine

## 2014-10-02 ENCOUNTER — Encounter (HOSPITAL_COMMUNITY): Payer: Self-pay | Admitting: Emergency Medicine

## 2014-10-02 DIAGNOSIS — M1990 Unspecified osteoarthritis, unspecified site: Secondary | ICD-10-CM | POA: Diagnosis present

## 2014-10-02 DIAGNOSIS — I248 Other forms of acute ischemic heart disease: Secondary | ICD-10-CM | POA: Diagnosis present

## 2014-10-02 DIAGNOSIS — I10 Essential (primary) hypertension: Secondary | ICD-10-CM | POA: Diagnosis present

## 2014-10-02 DIAGNOSIS — I5023 Acute on chronic systolic (congestive) heart failure: Secondary | ICD-10-CM | POA: Diagnosis present

## 2014-10-02 DIAGNOSIS — R739 Hyperglycemia, unspecified: Secondary | ICD-10-CM

## 2014-10-02 DIAGNOSIS — M549 Dorsalgia, unspecified: Secondary | ICD-10-CM | POA: Diagnosis present

## 2014-10-02 DIAGNOSIS — R7989 Other specified abnormal findings of blood chemistry: Secondary | ICD-10-CM

## 2014-10-02 DIAGNOSIS — G479 Sleep disorder, unspecified: Secondary | ICD-10-CM | POA: Diagnosis present

## 2014-10-02 DIAGNOSIS — Z7901 Long term (current) use of anticoagulants: Secondary | ICD-10-CM

## 2014-10-02 DIAGNOSIS — E785 Hyperlipidemia, unspecified: Secondary | ICD-10-CM | POA: Diagnosis present

## 2014-10-02 DIAGNOSIS — E669 Obesity, unspecified: Secondary | ICD-10-CM

## 2014-10-02 DIAGNOSIS — E876 Hypokalemia: Secondary | ICD-10-CM | POA: Diagnosis not present

## 2014-10-02 DIAGNOSIS — Z881 Allergy status to other antibiotic agents status: Secondary | ICD-10-CM | POA: Diagnosis not present

## 2014-10-02 DIAGNOSIS — I472 Ventricular tachycardia: Secondary | ICD-10-CM | POA: Diagnosis present

## 2014-10-02 DIAGNOSIS — Z85820 Personal history of malignant melanoma of skin: Secondary | ICD-10-CM | POA: Diagnosis not present

## 2014-10-02 DIAGNOSIS — D649 Anemia, unspecified: Secondary | ICD-10-CM

## 2014-10-02 DIAGNOSIS — Z6834 Body mass index (BMI) 34.0-34.9, adult: Secondary | ICD-10-CM | POA: Diagnosis not present

## 2014-10-02 DIAGNOSIS — G8929 Other chronic pain: Secondary | ICD-10-CM | POA: Diagnosis present

## 2014-10-02 DIAGNOSIS — Z96641 Presence of right artificial hip joint: Secondary | ICD-10-CM | POA: Diagnosis present

## 2014-10-02 DIAGNOSIS — I252 Old myocardial infarction: Secondary | ICD-10-CM | POA: Diagnosis not present

## 2014-10-02 DIAGNOSIS — I429 Cardiomyopathy, unspecified: Secondary | ICD-10-CM | POA: Diagnosis present

## 2014-10-02 DIAGNOSIS — I4891 Unspecified atrial fibrillation: Secondary | ICD-10-CM | POA: Diagnosis present

## 2014-10-02 DIAGNOSIS — Z9841 Cataract extraction status, right eye: Secondary | ICD-10-CM | POA: Diagnosis not present

## 2014-10-02 DIAGNOSIS — D696 Thrombocytopenia, unspecified: Secondary | ICD-10-CM | POA: Diagnosis present

## 2014-10-02 DIAGNOSIS — I509 Heart failure, unspecified: Secondary | ICD-10-CM

## 2014-10-02 DIAGNOSIS — I959 Hypotension, unspecified: Secondary | ICD-10-CM | POA: Diagnosis present

## 2014-10-02 DIAGNOSIS — G4730 Sleep apnea, unspecified: Secondary | ICD-10-CM

## 2014-10-02 DIAGNOSIS — Z87891 Personal history of nicotine dependence: Secondary | ICD-10-CM

## 2014-10-02 DIAGNOSIS — R001 Bradycardia, unspecified: Secondary | ICD-10-CM

## 2014-10-02 DIAGNOSIS — I2510 Atherosclerotic heart disease of native coronary artery without angina pectoris: Secondary | ICD-10-CM | POA: Diagnosis present

## 2014-10-02 DIAGNOSIS — R778 Other specified abnormalities of plasma proteins: Secondary | ICD-10-CM

## 2014-10-02 DIAGNOSIS — I480 Paroxysmal atrial fibrillation: Secondary | ICD-10-CM | POA: Diagnosis present

## 2014-10-02 DIAGNOSIS — I428 Other cardiomyopathies: Secondary | ICD-10-CM

## 2014-10-02 DIAGNOSIS — I5021 Acute systolic (congestive) heart failure: Secondary | ICD-10-CM

## 2014-10-02 DIAGNOSIS — R0602 Shortness of breath: Secondary | ICD-10-CM | POA: Diagnosis present

## 2014-10-02 LAB — BASIC METABOLIC PANEL
ANION GAP: 8 (ref 5–15)
BUN: 16 mg/dL (ref 6–23)
CALCIUM: 8 mg/dL — AB (ref 8.4–10.5)
CO2: 27 mmol/L (ref 19–32)
Chloride: 103 mEq/L (ref 96–112)
Creatinine, Ser: 1.28 mg/dL (ref 0.50–1.35)
GFR calc Af Amer: 62 mL/min — ABNORMAL LOW (ref >90)
GFR, EST NON AFRICAN AMERICAN: 54 mL/min — AB (ref >90)
GLUCOSE: 200 mg/dL — AB (ref 70–99)
POTASSIUM: 4.4 mmol/L (ref 3.5–5.1)
SODIUM: 138 mmol/L (ref 135–145)

## 2014-10-02 LAB — CBC
HCT: 35.6 % — ABNORMAL LOW (ref 39.0–52.0)
Hemoglobin: 11.8 g/dL — ABNORMAL LOW (ref 13.0–17.0)
MCH: 30.2 pg (ref 26.0–34.0)
MCHC: 33.1 g/dL (ref 30.0–36.0)
MCV: 91 fL (ref 78.0–100.0)
Platelets: 137 K/uL — ABNORMAL LOW (ref 150–400)
RBC: 3.91 MIL/uL — ABNORMAL LOW (ref 4.22–5.81)
RDW: 15.9 % — ABNORMAL HIGH (ref 11.5–15.5)
WBC: 9.5 K/uL (ref 4.0–10.5)

## 2014-10-02 LAB — PROTIME-INR
INR: 1.94 — ABNORMAL HIGH (ref 0.00–1.49)
Prothrombin Time: 22.3 seconds — ABNORMAL HIGH (ref 11.6–15.2)

## 2014-10-02 LAB — I-STAT TROPONIN, ED: TROPONIN I, POC: 0.04 ng/mL (ref 0.00–0.08)

## 2014-10-02 LAB — BRAIN NATRIURETIC PEPTIDE: B Natriuretic Peptide: 1005.2 pg/mL — ABNORMAL HIGH (ref 0.0–100.0)

## 2014-10-02 LAB — MAGNESIUM: Magnesium: 1.9 mg/dL (ref 1.5–2.5)

## 2014-10-02 LAB — TROPONIN I: Troponin I: 0.06 ng/mL — ABNORMAL HIGH (ref <0.031)

## 2014-10-02 LAB — MRSA PCR SCREENING: MRSA BY PCR: NEGATIVE

## 2014-10-02 MED ORDER — TAMSULOSIN HCL 0.4 MG PO CAPS
0.4000 mg | ORAL_CAPSULE | Freq: Every day | ORAL | Status: DC
  Administered 2014-10-02 – 2014-10-08 (×7): 0.4 mg via ORAL
  Filled 2014-10-02 (×7): qty 1

## 2014-10-02 MED ORDER — ONDANSETRON HCL 4 MG/2ML IJ SOLN: 4.0000 mg | Freq: Four times a day (QID) | INTRAMUSCULAR | Status: DC | PRN

## 2014-10-02 MED ORDER — POTASSIUM CHLORIDE CRYS ER 20 MEQ PO TBCR
20.0000 meq | EXTENDED_RELEASE_TABLET | Freq: Two times a day (BID) | ORAL | Status: DC
  Administered 2014-10-02 – 2014-10-03 (×3): 20 meq via ORAL
  Filled 2014-10-02: qty 2
  Filled 2014-10-02: qty 1
  Filled 2014-10-02: qty 2

## 2014-10-02 MED ORDER — SODIUM CHLORIDE 0.9 % IJ SOLN
3.0000 mL | Freq: Two times a day (BID) | INTRAMUSCULAR | Status: DC
  Administered 2014-10-04 – 2014-10-08 (×6): 3 mL via INTRAVENOUS

## 2014-10-02 MED ORDER — DILTIAZEM HCL 100 MG IV SOLR
5.0000 mg/h | INTRAVENOUS | Status: DC
  Administered 2014-10-02: 15 mg/h via INTRAVENOUS
  Administered 2014-10-03 – 2014-10-04 (×3): 5 mg/h via INTRAVENOUS

## 2014-10-02 MED ORDER — ALPRAZOLAM 0.25 MG PO TABS
0.2500 mg | ORAL_TABLET | Freq: Two times a day (BID) | ORAL | Status: DC | PRN
  Administered 2014-10-05: 0.25 mg via ORAL
  Filled 2014-10-02: qty 1

## 2014-10-02 MED ORDER — NIACIN ER (ANTIHYPERLIPIDEMIC) 500 MG PO TBCR
1000.0000 mg | EXTENDED_RELEASE_TABLET | Freq: Every day | ORAL | Status: DC
  Filled 2014-10-02 (×2): qty 2

## 2014-10-02 MED ORDER — LOSARTAN POTASSIUM 50 MG PO TABS
100.0000 mg | ORAL_TABLET | Freq: Every day | ORAL | Status: DC
  Administered 2014-10-03: 100 mg via ORAL
  Filled 2014-10-02: qty 2

## 2014-10-02 MED ORDER — GABAPENTIN 300 MG PO CAPS
300.0000 mg | ORAL_CAPSULE | Freq: Every day | ORAL | Status: DC
  Administered 2014-10-02 – 2014-10-07 (×6): 300 mg via ORAL
  Filled 2014-10-02 (×7): qty 1

## 2014-10-02 MED ORDER — GABAPENTIN 100 MG PO CAPS
200.0000 mg | ORAL_CAPSULE | Freq: Every morning | ORAL | Status: DC
  Administered 2014-10-03 – 2014-10-08 (×6): 200 mg via ORAL
  Filled 2014-10-02 (×6): qty 2

## 2014-10-02 MED ORDER — DILTIAZEM HCL 100 MG IV SOLR
5.0000 mg/h | Freq: Once | INTRAVENOUS | Status: AC
  Administered 2014-10-02: 5 mg/h via INTRAVENOUS

## 2014-10-02 MED ORDER — PRAVASTATIN SODIUM 80 MG PO TABS
80.0000 mg | ORAL_TABLET | Freq: Every day | ORAL | Status: DC
  Administered 2014-10-02 – 2014-10-07 (×6): 80 mg via ORAL
  Filled 2014-10-02 (×8): qty 1

## 2014-10-02 MED ORDER — SODIUM CHLORIDE 0.9 % IV SOLN
250.0000 mL | INTRAVENOUS | Status: DC | PRN
  Administered 2014-10-05: 12:00:00 via INTRAVENOUS

## 2014-10-02 MED ORDER — CARVEDILOL 6.25 MG PO TABS
6.2500 mg | ORAL_TABLET | Freq: Two times a day (BID) | ORAL | Status: DC
  Administered 2014-10-02 – 2014-10-03 (×3): 6.25 mg via ORAL
  Filled 2014-10-02 (×3): qty 1

## 2014-10-02 MED ORDER — ZOLPIDEM TARTRATE 5 MG PO TABS: 5.0000 mg | ORAL_TABLET | Freq: Every evening | ORAL | Status: DC | PRN

## 2014-10-02 MED ORDER — RIVAROXABAN 20 MG PO TABS
20.0000 mg | ORAL_TABLET | Freq: Every day | ORAL | Status: DC
  Administered 2014-10-02 – 2014-10-07 (×6): 20 mg via ORAL
  Filled 2014-10-02 (×6): qty 1

## 2014-10-02 MED ORDER — ACETAMINOPHEN 325 MG PO TABS: 650.0000 mg | ORAL_TABLET | ORAL | Status: DC | PRN

## 2014-10-02 MED ORDER — FUROSEMIDE 10 MG/ML IJ SOLN
40.0000 mg | Freq: Two times a day (BID) | INTRAMUSCULAR | Status: DC
  Administered 2014-10-02 – 2014-10-03 (×3): 40 mg via INTRAVENOUS
  Filled 2014-10-02 (×3): qty 4

## 2014-10-02 MED ORDER — SODIUM CHLORIDE 0.9 % IJ SOLN: 3.0000 mL | INTRAMUSCULAR | Status: DC | PRN

## 2014-10-02 MED ORDER — NITROGLYCERIN 0.4 MG SL SUBL: 0.4000 mg | SUBLINGUAL_TABLET | SUBLINGUAL | Status: DC | PRN

## 2014-10-02 MED ORDER — DORZOLAMIDE HCL-TIMOLOL MAL 2-0.5 % OP SOLN
1.0000 [drp] | Freq: Two times a day (BID) | OPHTHALMIC | Status: DC
  Administered 2014-10-02 – 2014-10-08 (×12): 1 [drp] via OPHTHALMIC
  Filled 2014-10-02 (×2): qty 10

## 2014-10-02 MED ORDER — DILTIAZEM HCL 25 MG/5ML IV SOLN
20.0000 mg | Freq: Once | INTRAVENOUS | Status: AC
  Administered 2014-10-02: 20 mg via INTRAVENOUS
  Filled 2014-10-02: qty 5

## 2014-10-02 MED ORDER — CHLORHEXIDINE GLUCONATE CLOTH 2 % EX PADS
6.0000 | MEDICATED_PAD | Freq: Every day | CUTANEOUS | Status: DC
  Administered 2014-10-03: 6 via TOPICAL

## 2014-10-02 MED ORDER — FINASTERIDE 5 MG PO TABS
5.0000 mg | ORAL_TABLET | Freq: Every day | ORAL | Status: DC
  Administered 2014-10-03 – 2014-10-08 (×6): 5 mg via ORAL
  Filled 2014-10-02 (×6): qty 1

## 2014-10-02 MED ORDER — GABAPENTIN 100 MG PO CAPS: 200.0000 mg | ORAL_CAPSULE | Freq: Two times a day (BID) | ORAL | Status: DC

## 2014-10-02 NOTE — ED Notes (Signed)
Pt c/o shortness of breath x 3 days and 17 lb weight gain since 12/17. EKG done in triage shows pt to be in afib with RVR. Pt reports small amount of swelling in ankles.

## 2014-10-02 NOTE — ED Provider Notes (Signed)
CSN: [id number]     Arrival date & time 10/02/14  [id number] History   First MD Initiated Contact with Patient 10/02/14 [phone]     Chief Complaint  Patient presents with  . Shortness of Breath      HPI  Patient presents evaluation of shortness of breath and weight gain. Has a history of approximately to fibrillation and 2014. Underwent cardioversion following TEE. Remains on Coreg, and Xarelto for this. His dry weight for him is 206. He weighed 219 today. This weight gain has been over the last 2 weeks. He is not aware of palpitations or rapid heart rate has not been having chest pain. However he's been more short of breath over the last several days. To the point he could not lay flat or sleep last night and he presents here. He states he has felt cold over the last few days. Denies fever shakes chills riders. Occasional dry nonproductive cough. No changes in medications. Also takes furosemide 20 mg daily.  Past Medical History  Diagnosis Date  . Hyperlipidemia   . Hypertension   . Myocardial infarction 1998  . Systolic CHF     a. New dx 12/2012 ?NICM, may be r/t afib (has not had recent ischemic eval).  . Atrial fibrillation     a. Dx 12/2012, s/p TEE/DCCV 01/26/13. b. On Xarelto.  . Arthritis     "about all my joints" (01/26/2013)  . Chronic back pain   . 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 Sinus bradycardia 10/02/2014   Past Surgical History  Procedure Laterality Date  . Joint replacement      hip  . Eye surgery      glaucoma & cataract  . Back surgery  03/2000    "ground calcium deposits from upper thoracic" (01/26/2013)  . Total hip arthroplasty Right 06/2007  . Cataract extraction Right ~ 2006  . Glaucoma surgery Right ~ 2006    "put 3 stents in to drain fluid" (01/26/2013)  . Refractive surgery Right ~ 2006    "twice; both done at [place]" (01/26/2013)  . Surgery scrotal / testicular Right 1990's  . Incision and drainage abscess posterior cervicalspine  05/2012  . Cardiac  catheterization  1998  . Tee without cardioversion N/A 01/26/2013    Procedure: TRANSESOPHAGEAL ECHOCARDIOGRAM (TEE);  Surgeon: Lelon Perla, MD;  Location: [facility];  Service: Cardiovascular;  Laterality: N/A;  [name] anes. /   . Cardioversion N/A 01/26/2013    Procedure: CARDIOVERSION;  Surgeon: Lelon Perla, MD;  Location: [facility] END[facility];  Service: Cardiovascular;  Laterality: N/A;   Family History  Problem Relation Age of Onset  . Kidney disease Mother   . Heart disease Mother   . Diabetes Mother   . Cancer Father     leukemia   History  Substance Use Topics  . Smoking status: Former Smoker -- 3.00 packs/day for 48 years    Types: Cigarettes    Quit date: 09/28/2000  . Smokeless tobacco: Never Used  . Alcohol Use: No    Review of Systems  Constitutional: Negative for fever, chills, diaphoresis, appetite change and fatigue.  HENT: Negative for mouth sores, sore throat and trouble swallowing.   Eyes: Negative for visual disturbance.  Respiratory: Positive for cough and shortness of breath. Negative for chest tightness and wheezing.   Cardiovascular: Negative for chest pain and leg swelling.  Gastrointestinal: Negative for nausea, vomiting, abdominal pain, diarrhea and abdominal distention.  Endocrine: Negative for polydipsia, polyphagia  and polyuria.  Genitourinary: Negative for dysuria, frequency and hematuria.  Musculoskeletal: Negative for gait problem.  Skin: Negative for color change, pallor and rash.  Neurological: Negative for dizziness, syncope, light-headedness and headaches.  Hematological: Does not bruise/bleed easily.  Psychiatric/Behavioral: Negative for behavioral problems and confusion.      Allergies  Zithromax  Home Medications   Prior to Admission medications   Medication Sig Start Date End Date Taking? Authorizing Provider  carvedilol (COREG) 6.25 MG tablet Take 6.25 mg by mouth 2 (two) times daily with a meal.   Yes Historical Provider, MD   dorzolamide-timolol (COSOPT) 22.3-6.8 MG/ML ophthalmic solution Place 1 drop into the right eye 2 (two) times daily.   Yes Historical Provider, MD  finasteride (PROSCAR) 5 MG tablet Take 5 mg by mouth as directed. 02/13/13  Yes Historical Provider, MD  furosemide (LASIX) 20 MG tablet Take 1 tablet by mouth once a day 07/29/14  Yes Josue Hector, MD  gabapentin (NEURONTIN) 100 MG capsule Take 2 capsules in the morning and 3 capsules at night 02/20/12  Yes Historical Provider, MD  losartan (COZAAR) 100 MG tablet TAKE 1 TABLET DAILY 07/09/14  Yes Josue Hector, MD  NIASPAN 500 MG CR tablet Take 1,000 mg by mouth at bedtime.  05/09/12  Yes Historical Provider, MD  Pitavastatin Calcium (LIVALO) 4 MG TABS Take 1 tablet by mouth daily. 05/28/14  Yes Historical Provider, MD  potassium chloride SA (K-DUR,KLOR-CON) 20 MEQ tablet Take 20 mEq by mouth daily. 01/27/13  Yes Dayna N Dunn, PA-C  rivaroxaban (XARELTO) 20 MG TABS tablet Take 1 tablet (20 mg total) by mouth daily with supper. 09/08/14  Yes Josue Hector, MD  tamsulosin (FLOMAX) 0.4 MG CAPS Take 0.4 mg by mouth daily.   Yes Historical Provider, MD   BP 117/98 mmHg  Pulse 81  Temp(Src) 98.1 F (36.7 C) (Oral)  Resp 19  Ht 5\' 6"  (1.676 m)  Wt 219 lb 8 oz (99.565 kg)  BMI 35.45 kg/m2  SpO2 100% Physical Exam  Constitutional: He is oriented to person, place, and time. He appears well-developed and well-nourished. No distress.  HENT:  Head: Normocephalic.  Eyes: Conjunctivae are normal. Pupils are equal, round, and reactive to light. No scleral icterus.  Neck: Normal range of motion. Neck supple. No thyromegaly present.  Cardiovascular: An irregularly irregular rhythm present. Tachycardia present.  Exam reveals no gallop and no friction rub.   No murmur heard. Intrafibrillation with rapid ventricular response on the monitor. No gallop noted. No peripheral edema.  Pulmonary/Chest: Effort normal and breath sounds normal. No respiratory distress. He  has no wheezes. He has no rales.  Slightly diminished basilar breath sounds. No crackles or rales diffusely. No increased work of breathing. Not dyspneic with conversation.  Abdominal: Soft. Bowel sounds are normal. He exhibits no distension. There is no tenderness. There is no rebound.  Musculoskeletal: Normal range of motion.  Neurological: He is alert and oriented to person, place, and time.  Skin: Skin is warm and dry. No rash noted.  Psychiatric: He has a normal mood and affect. His behavior is normal.    ED Course  Procedures (including critical care time) Labs Review Labs Reviewed  CBC - Abnormal; Notable for the following:    RBC 3.91 (*)    Hemoglobin 11.8 (*)    HCT 35.6 (*)    RDW 15.9 (*)    Platelets 137 (*)    All other components within normal limits  BASIC METABOLIC  PANEL - Abnormal; Notable for the following:    Glucose, Bld 200 (*)    Calcium 8.0 (*)    GFR calc non Af Amer 54 (*)    GFR calc Af Amer 62 (*)    All other components within normal limits  BRAIN NATRIURETIC PEPTIDE - Abnormal; Notable for the following:    B Natriuretic Peptide 1005.2 (*)    All other components within normal limits  PROTIME-INR  I-STAT TROPOININ, ED    Imaging Review Dg Chest Port 1 View  10/02/2014   CLINICAL DATA:  Pt c/o shortness of breath x 3 days and 17 lb weight gain since 12/17. EKG done in triage shows pt to be in afib with RVR. Hx: HTN, MI, CHF  EXAM: PORTABLE CHEST - 1 VIEW  COMPARISON:  06/05/2013  FINDINGS: Stable cardiomegaly. Mild bibasilar interstitial opacities in both lung bases. No confluent airspace consolidation. No effusion. Degenerative changes in bilateral shoulders  IMPRESSION: 1. Stable cardiomegaly with mild bibasilar interstitial opacities, possibly subsegmental atelectasis versus early infiltrate.   Electronically Signed   By: Arne Cleveland M.D.   On: 10/02/2014 10:51     EKG Interpretation   Date/Time:  Saturday October 02 2014 09:31:37  EST Ventricular Rate:  131 PR Interval:    QRS Duration: 110 QT Interval:  334 QTC Calculation: 493 R Axis:   -32 Text Interpretation:  Atrial fibrillation with rapid ventricular response  Left axis deviation Possible Anterior infarct , age undetermined ST \\T \ T  wave abnormality, consider lateral ischemia or digitalis effect Abnormal  ECG Confirmed by Jeneen Rinks  MD, Ajo ([id number]) on 10/02/2014 9:45:04 AM      MDM   Final diagnoses:  CHF (congestive heart failure)  Paroxysmal atrial fibrillation    Not hypoxemic. In rapid A. fib. Clinically not in congestive heart failure. Plan is Cardizem, Cardizem drip if necessary. Lab studies, CXR.  I will consult his cardiologist after completion of initial studies and rate control.  Discussed with Dr. Caryl Comes. Recent increase in the emergency room.    Tanna Furry, MD 10/02/14 [phone]

## 2014-10-02 NOTE — H&P (Signed)
History and Physical   Patient ID: Chad Brown MRN: [id number], DOB/AGE: 1940-12-28 74 y.o. Date of Encounter: 10/02/2014  Primary Physician: Sherrie Mustache, MD Primary Cardiologist: Dr Johnsie Cancel  Chief Complaint:  Atrial fibrillation, shortness of breath  HPI: Chad Brown is a 74 y.o. male with a history of CAD, PAF, LVD with an EF of 30-35 percent by echocardiogram in [id number], chronic systolic CHF. He is anticoagulated with Xarelto, but had missed several doses recently because of running out of medication, and delays in getting it filled.  In the last 2 weeks, his weight is up 17 pounds. He notes that the weight was steady but he kept increasing. He has not had lower extremity edema but he rarely gets this. He has noticed increasing shortness of breath, dyspnea on exertion as well as orthopnea but denies PND. He also notes increasing abdominal girth.   The symptoms became much worse about 3 days ago. He does not feel like he was drinking an unusual amount of fluids but it gets to dietary indiscretions because of the holidays. Symptoms became worse about 3 days ago. He began getting lightheaded and having orthostatic dizziness. His exercise tolerance greatly decreased. He did not have any syncopal episodes. He has not had any chest pain. He has not had any palpitations, but has never had palpitations, even when he was in rapid atrial fibrillation. He normally checks his blood pressure and heart rate daily, but has not done that in the last week. At baseline, his heart rate is in the 50s and his blood pressure is well controlled.  Today, the shortness of breath and lightheaded feelings became worse, so he came to the emergency room. He is in atrial fibrillation with rapid ventricular response, and his heart rate is still greater than 110, even on Cardizem IV at 15 mg per hour.   Past Medical History  Diagnosis Date  . Hyperlipidemia   . Hypertension   . Myocardial infarction  1998  . Systolic CHF     a. New dx 12/2012 ?NICM, may be r/t afib (has not had recent ischemic eval).  . Atrial fibrillation     a. Dx 12/2012, s/p TEE/DCCV 01/26/13. b. On Xarelto.  . Arthritis     "about all my joints" (01/26/2013)  . Chronic back pain   . Melanoma of eye 2000's    "right"    Surgical History:  Past Surgical History  Procedure Laterality Date  . Joint replacement      hip  . Eye surgery      glaucoma & cataract  . Back surgery  03/2000    "ground calcium deposits from upper thoracic" (01/26/2013)  . Total hip arthroplasty Right 06/2007  . Cataract extraction Right ~ 2006  . Glaucoma surgery Right ~ 2006    "put 3 stents in to drain fluid" (01/26/2013)  . Refractive surgery Right ~ 2006    "twice; both done at [facility]" (01/26/2013)  . Surgery scrotal / testicular Right 1990's  . Incision and drainage abscess posterior cervicalspine  05/2012  . Cardiac catheterization  1998  . Tee without cardioversion N/A 01/26/2013    Procedure: TRANSESOPHAGEAL ECHOCARDIOGRAM (TEE);  Surgeon: Lelon Perla, MD;  Location: [facility];  Service: Cardiovascular;  Laterality: N/A;  [name] anes. /   . Cardioversion N/A 01/26/2013    Procedure: CARDIOVERSION;  Surgeon: Lelon Perla, MD;  Location: [facility];  Service: Cardiovascular;  Laterality: N/A;     I  have reviewed the patient's current medications. Prior to Admission medications   Medication Sig Start Date End Date Taking? Authorizing Provider  carvedilol (COREG) 6.25 MG tablet Take 6.25 mg by mouth 2 (two) times daily with a meal.   Yes Historical Provider, MD  dorzolamide-timolol (COSOPT) 22.3-6.8 MG/ML ophthalmic solution Place 1 drop into the right eye 2 (two) times daily.   Yes Historical Provider, MD  finasteride (PROSCAR) 5 MG tablet Take 5 mg by mouth as directed. 02/13/13  Yes Historical Provider, MD  furosemide (LASIX) 20 MG tablet Take 1 tablet by mouth once a day 07/29/14  Yes Josue Hector, MD  gabapentin  (NEURONTIN) 100 MG capsule Take 2 capsules in the morning and 3 capsules at night 02/20/12  Yes Historical Provider, MD  losartan (COZAAR) 100 MG tablet TAKE 1 TABLET DAILY 07/09/14  Yes Josue Hector, MD  NIASPAN 500 MG CR tablet Take 1,000 mg by mouth at bedtime.  05/09/12  Yes Historical Provider, MD  Pitavastatin Calcium (LIVALO) 4 MG TABS Take 1 tablet by mouth daily. 05/28/14  Yes Historical Provider, MD  potassium chloride SA (K-DUR,KLOR-CON) 20 MEQ tablet Take 20 mEq by mouth daily. 01/27/13  Yes Dayna N Dunn, PA-C  rivaroxaban (XARELTO) 20 MG TABS tablet Take 1 tablet (20 mg total) by mouth daily with supper. 09/08/14  Yes Josue Hector, MD  tamsulosin (FLOMAX) 0.4 MG CAPS Take 0.4 mg by mouth daily.   Yes Historical Provider, MD   Allergies:  Allergies  Allergen Reactions  . Zithromax [Azithromycin] Diarrhea   History   Social History  . Marital Status: Married    Spouse Name: N/A    Number of Children: N/A  . Years of Education: N/A   Occupational History  . Not on file.   Social History Main Topics  . Smoking status: Former Smoker -- 3.00 packs/day for 48 years    Types: Cigarettes    Quit date: 09/28/2000  . Smokeless tobacco: Never Used  . Alcohol Use: No  . Drug Use: No  . Sexual Activity: Not on file   Other Topics Concern  . Not on file   Social History Narrative    Family History  Problem Relation Age of Onset  . Kidney disease Mother   . Heart disease Mother   . Diabetes Mother   . Cancer Father     leukemia   Family Status  Relation Status Death Age  . Mother Deceased   . Father Deceased     Review of Systems:   Full 14-point review of systems otherwise negative except as noted above.  Physical Exam: Blood pressure 117/98, pulse 81, temperature 98.1 F (36.7 C), temperature source Oral, resp. rate 19, height 5\' 6"  (1.676 m), weight 219 lb 8 oz (99.565 kg), SpO2 100 %. General: Well developed, well nourished,male in no acute distress. Head:  Normocephalic, atraumatic, sclera non-icteric, no xanthomas, nares are without discharge. Dentition: Good Neck: No carotid bruits. JVD 9 cm. No thyromegally Lungs: Good expansion bilaterally. without wheezes or rhonchi. Decreased breath sounds bases Heart: Rapid and IRRegular rate and rhythm with S1 S2.  No S3 or S4.  Soft murmur, no rubs, or gallops appreciated. Abdomen: Soft, non-tender, non-distended with normoactive bowel sounds. No hepatomegaly. No rebound/guarding. No obvious abdominal masses. Msk:  Strength and tone appear normal for age. No joint deformities or effusions, no spine or costo-vertebral angle tenderness. Extremities: No clubbing or cyanosis. No edema.  Distal pedal pulses are 2+ in 4  extrem Neuro: Alert and oriented X 3. Moves all extremities spontaneously. No focal deficits noted. Psych:  Responds to questions appropriately with a normal affect. Skin: No rashes or lesions noted  Labs:   Lab Results  Component Value Date   WBC 9.5 10/02/2014   HGB 11.8* 10/02/2014   HCT 35.6* 10/02/2014   MCV 91.0 10/02/2014   PLT 137* 10/02/2014     Recent Labs Lab 10/02/14 1001  NA 138  K 4.4  CL 103  CO2 27  BUN 16  CREATININE 1.28  CALCIUM 8.0*  GLUCOSE 200*    Recent Labs  10/02/14 1026  TROPIPOC 0.04   B NATRIURETIC PEPTIDE  Date Value Ref Range Status  10/02/2014 1005.2* 0.0 - 100.0 pg/mL Final    Comment:    Please note change in reference range.   PRO B NATRIURETIC PEPTIDE (BNP)  Date/Time Value Ref Range Status  03/06/2013 10:31 AM 486.0* 0.0 - 100.0 pg/mL Final  01/22/2013 07:14 PM 1534.0* 0 - 125 pg/mL Final   Radiology/Studies: Dg Chest Port 1 View 10/02/2014   CLINICAL DATA:  Pt c/o shortness of breath x 3 days and 17 lb weight gain since 12/17. EKG done in triage shows pt to be in afib with RVR. Hx: HTN, MI, CHF  EXAM: PORTABLE CHEST - 1 VIEW  COMPARISON:  06/05/2013  FINDINGS: Stable cardiomegaly. Mild bibasilar interstitial opacities in both lung  bases. No confluent airspace consolidation. No effusion. Degenerative changes in bilateral shoulders  IMPRESSION: 1. Stable cardiomegaly with mild bibasilar interstitial opacities, possibly subsegmental atelectasis versus early infiltrate.   Electronically Signed   By: Arne Cleveland M.D.   On: 10/02/2014 10:51   Echo: 01/26/2013 Conclusions - Left ventricle: Systolic function was moderately to severely reduced. The estimated ejection fraction was in the range of 30% to 35%. Diffuse hypokinesis. - Aortic valve: Mildly calcified annulus. - Mitral valve: No evidence of vegetation. Mild regurgitation. - Left atrium: The atrium was moderately dilated. No evidence of thrombus in the atrial cavity or appendage. - Atrial septum: No defect or patent foramen ovale was identified. - Tricuspid valve: No evidence of vegetation. - Pulmonic valve: No evidence of vegetation.  ECG: atrial fib, RVR  ASSESSMENT AND PLAN:  Principal Problem:   Acute systolic CHF (congestive heart failure) - Needs diuresis, start with Lasix 40 mg IV BID and assess response  Active Problems:   Atrial fibrillation with rapid ventricular response - continue rate control.  Sinus bradycardia  Jonetta Speak, PA-C 10/02/2014 3:33 PM Beeper [phone]    Admitted with recurrent a/c Combined CHF in the setting of AFib RVR and 15 pound weight gain.  Examination is notable for JVP, clear lungs, peripheral edema and heart rates taht  very sensitive to exertion   This is a similar presentation to 4/14. Unfortunately he missed a few doses of his Rivaroxaban because of confusions with the pharmacies. Hence, he will need TEE guided cardioversion for the restoration of sinus rhythm. I've instructed him how to take his pulse as this will help was going forward in management by by helping [place] to determine frequency duration and onset of atrial fibrillation.  He has sleep disordered breathing; sleep study would be  appropriate.  Unfortunately aggressive rate titration as an outpatient is limited by sinus bradycardia. In the event that antiarrhythmic therapy is elected, dofetilide would probably be our best option  I think is also reasonable to consider catheterization as it's been many years and the presence or absence  of coronary disease and forms directly treatment options for his cardiomyopathy. It also be reasonable for [place] to consider, in the event of persistent left ventricular dysfunction, ICD implantation for primary prevention against sudden cardiac death

## 2014-10-03 LAB — COMPREHENSIVE METABOLIC PANEL
ALBUMIN: 2.7 g/dL — AB (ref 3.5–5.2)
ALT: 18 U/L (ref 0–53)
AST: 14 U/L (ref 0–37)
Alkaline Phosphatase: 55 U/L (ref 39–117)
Anion gap: 7 (ref 5–15)
BUN: 17 mg/dL (ref 6–23)
CALCIUM: 7.6 mg/dL — AB (ref 8.4–10.5)
CHLORIDE: 105 meq/L (ref 96–112)
CO2: 25 mmol/L (ref 19–32)
Creatinine, Ser: 1.14 mg/dL (ref 0.50–1.35)
GFR calc non Af Amer: 62 mL/min — ABNORMAL LOW (ref >90)
GFR, EST AFRICAN AMERICAN: 72 mL/min — AB (ref >90)
Glucose, Bld: 145 mg/dL — ABNORMAL HIGH (ref 70–99)
POTASSIUM: 3.6 mmol/L (ref 3.5–5.1)
Sodium: 137 mmol/L (ref 135–145)
TOTAL PROTEIN: 5.6 g/dL — AB (ref 6.0–8.3)
Total Bilirubin: 0.9 mg/dL (ref 0.3–1.2)

## 2014-10-03 LAB — HEMOGLOBIN A1C
Hgb A1c MFr Bld: 6.5 % — ABNORMAL HIGH (ref <5.7)
MEAN PLASMA GLUCOSE: 140 mg/dL — AB (ref <117)

## 2014-10-03 LAB — TROPONIN I
TROPONIN I: 0.07 ng/mL — AB (ref <0.031)
Troponin I: 0.07 ng/mL — ABNORMAL HIGH (ref <0.031)

## 2014-10-03 MED ORDER — ACTIVE PARTNERSHIP FOR HEALTH OF YOUR HEART BOOK
Freq: Once | Status: AC
  Administered 2014-10-03: 09:00:00
  Filled 2014-10-03: qty 1

## 2014-10-03 MED ORDER — OFF THE BEAT BOOK
Freq: Once | Status: AC
  Administered 2014-10-03: 09:00:00
  Filled 2014-10-03: qty 1

## 2014-10-03 MED ORDER — LIVING WELL WITH DIABETES BOOK
Freq: Once | Status: AC
  Administered 2014-10-03: 14:00:00
  Filled 2014-10-03: qty 1

## 2014-10-03 NOTE — Progress Notes (Signed)
Pt resting in bed. Pt stated he felt hot. BP 90/50. HR in the 80's-100's afib with Cardizem going at 20 ml/hr. Cardizem drip changed to 5 ml/hr. HR sustaining in the 100's-120's afib. Md on call made aware. Cool wash cloth given to pt. Will continue to monitor.

## 2014-10-03 NOTE — Plan of Care (Signed)
Problem: Phase I Progression Outcomes Goal: Anticoagulation Therapy per MD order Outcome: Completed/Met Date Met:  10/03/14 xarelto Goal: Heart rate or rhythm control medication Outcome: Completed/Met Date Met:  10/03/14 cardizem drip 5/hr Goal: Initial discharge plan identified Outcome: Completed/Met Date Met:  10/03/14 Return to home with wife Goal: Hemodynamically stable Outcome: Progressing SBP-90's, HR 90-117  Problem: Phase I Progression Outcomes Goal: EF % per last Echo/documented,Core Reminder form on chart Outcome: Completed/Met Date Met:  10/03/14 30-35% per last ECHO 2014

## 2014-10-03 NOTE — Progress Notes (Signed)
Md aware of patient's troponins. No new orders received. Will cont to monitor pt.

## 2014-10-03 NOTE — Progress Notes (Addendum)
Patient ID: Chad Brown, male   DOB: 1941-03-27, 74 y.o.   MRN: [id number]    Subjective:  Denies SSCP, palpitations  Dyspnea improved some urine output  confims missing 3-4 doses of xarelto  Objective:  Filed Vitals:   10/03/14 0143 10/03/14 0252 10/03/14 0521 10/03/14 0813  BP: 90/45 108/80 97/56 91/57   Pulse:   103 117  Temp:   97.5 F (36.4 C) 97.7 F (36.5 C)  TempSrc:   Oral Oral  Resp:   16   Height:      Weight:   99.156 kg (218 lb 9.6 oz)   SpO2:   100% 100%    Intake/Output from previous day:  Intake/Output Summary (Last 24 hours) at 10/03/14 0855 Last data filed at 10/03/14 0525  Gross per 24 hour  Intake    480 ml  Output   1000 ml  Net   -520 ml    Physical Exam: Affect appropriate Obese white male  HEENT: normal Neck supple with no adenopathy JVP normal no bruits no thyromegaly Lungs bilateral upper airway  wheezing and good diaphragmatic motion Heart:  S1/S2 SEM  murmur, no rub, gallop or click PMI normal Abdomen: benighn, BS positve, no tenderness, no AAA no bruit.  No HSM or HJR Distal pulses intact with no bruits Plus one bilateral  edema Neuro non-focal Skin warm and dry No muscular weakness   Lab Results: Basic Metabolic Panel:  Recent Labs  10/02/14 1001 10/02/14 1849 10/03/14 0608  NA 138  --  137  K 4.4  --  3.6  CL 103  --  105  CO2 27  --  25  GLUCOSE 200*  --  145*  BUN 16  --  17  CREATININE 1.28  --  1.14  CALCIUM 8.0*  --  7.6*  MG  --  1.9  --    Liver Function Tests:  Recent Labs  10/03/14 0608  AST 14  ALT 18  ALKPHOS 55  BILITOT 0.9  PROT 5.6*  ALBUMIN 2.7*   CBC:  Recent Labs  10/02/14 1001  WBC 9.5  HGB 11.8*  HCT 35.6*  MCV 91.0  PLT 137*   Cardiac Enzymes:  Recent Labs  10/02/14 1849 10/03/14 0005 10/03/14 0608  TROPONINI 0.06* 0.07* 0.07*    Imaging: Dg Chest Port 1 View  10/02/2014   CLINICAL DATA:  Pt c/o shortness of breath x 3 days and 17 lb weight gain since 12/17. EKG  done in triage shows pt to be in afib with RVR. Hx: HTN, MI, CHF  EXAM: PORTABLE CHEST - 1 VIEW  COMPARISON:  06/05/2013  FINDINGS: Stable cardiomegaly. Mild bibasilar interstitial opacities in both lung bases. No confluent airspace consolidation. No effusion. Degenerative changes in bilateral shoulders  IMPRESSION: 1. Stable cardiomegaly with mild bibasilar interstitial opacities, possibly subsegmental atelectasis versus early infiltrate.   Electronically Signed   By: Arne Cleveland M.D.   On: 10/02/2014 10:51    Cardiac Studies:  ECG:    Telemetry:  Echo:   Medications:   . active partnership for health of your heart book   Does not apply Once  . carvedilol  6.25 mg Oral BID WC  . Chlorhexidine Gluconate Cloth  6 each Topical Q0600  . dorzolamide-timolol  1 drop Right Eye BID  . finasteride  5 mg Oral Daily  . furosemide  40 mg Intravenous BID  . gabapentin  200 mg Oral q morning - 10a  . gabapentin  300  mg Oral QHS  . losartan  100 mg Oral Daily  . niacin  1,000 mg Oral QHS  . off the beat book   Does not apply Once  . potassium chloride SA  20 mEq Oral BID  . pravastatin  80 mg Oral q1800  . rivaroxaban  20 mg Oral Q supper  . sodium chloride  3 mL Intravenous Q12H  . tamsulosin  0.4 mg Oral Daily     . diltiazem (CARDIZEM) infusion 5 mg/hr (10/03/14 0514)    Assessment/Plan:  Afib:  Needs CHF cleared a bit then TEE/DCC  Will make NPO for Tuesday will need to be scheduled by Trish in am Day 2 resumption of xarelto Rate control ok on coreg  Orders written for TEE/DCC Tuesday  CHF:  F/u echo continue lasix 40 iv bid  14 lb weight gain echo 4/14 with EF 30-35%  Had nonischemic myovue in 2014   Continue losartan  [name] 10/03/2014, 8:55 AM

## 2014-10-04 ENCOUNTER — Encounter (HOSPITAL_COMMUNITY): Payer: Self-pay | Admitting: Physician Assistant

## 2014-10-04 DIAGNOSIS — R739 Hyperglycemia, unspecified: Secondary | ICD-10-CM

## 2014-10-04 DIAGNOSIS — D696 Thrombocytopenia, unspecified: Secondary | ICD-10-CM

## 2014-10-04 DIAGNOSIS — D649 Anemia, unspecified: Secondary | ICD-10-CM

## 2014-10-04 DIAGNOSIS — R778 Other specified abnormalities of plasma proteins: Secondary | ICD-10-CM

## 2014-10-04 DIAGNOSIS — I959 Hypotension, unspecified: Secondary | ICD-10-CM

## 2014-10-04 DIAGNOSIS — E669 Obesity, unspecified: Secondary | ICD-10-CM

## 2014-10-04 DIAGNOSIS — G4730 Sleep apnea, unspecified: Secondary | ICD-10-CM

## 2014-10-04 DIAGNOSIS — R7989 Other specified abnormal findings of blood chemistry: Secondary | ICD-10-CM

## 2014-10-04 DIAGNOSIS — I428 Other cardiomyopathies: Secondary | ICD-10-CM

## 2014-10-04 LAB — BASIC METABOLIC PANEL
Anion gap: 6 (ref 5–15)
BUN: 19 mg/dL (ref 6–23)
CHLORIDE: 107 meq/L (ref 96–112)
CO2: 29 mmol/L (ref 19–32)
CREATININE: 1.24 mg/dL (ref 0.50–1.35)
Calcium: 8.1 mg/dL — ABNORMAL LOW (ref 8.4–10.5)
GFR calc non Af Amer: 56 mL/min — ABNORMAL LOW (ref >90)
GFR, EST AFRICAN AMERICAN: 65 mL/min — AB (ref >90)
GLUCOSE: 106 mg/dL — AB (ref 70–99)
POTASSIUM: 4.3 mmol/L (ref 3.5–5.1)
Sodium: 142 mmol/L (ref 135–145)

## 2014-10-04 MED ORDER — CARVEDILOL 6.25 MG PO TABS
6.2500 mg | ORAL_TABLET | Freq: Two times a day (BID) | ORAL | Status: DC
  Administered 2014-10-04 – 2014-10-06 (×5): 6.25 mg via ORAL
  Filled 2014-10-04 (×5): qty 1

## 2014-10-04 NOTE — Progress Notes (Signed)
UR Completed Akima Slaugh Graves-Bigelow, RN,BSN [phone]  

## 2014-10-04 NOTE — Care Management Note (Signed)
    Page 1 of 2   10/07/2014     12:02:55 PM CARE MANAGEMENT NOTE 10/07/2014  Patient:  Chad Brown,Chad Brown   Account Number:  [id number]  Date Initiated:  10/04/2014  Documentation initiated by:  GRAVES-BIGELOW,Yaneisy Wenz  Subjective/Objective Assessment:   Pt admitted for Atrial fibrillation, shortness of breath.     Action/Plan:   No needs identified from CM at this time. Pt has insurance for medications, however just delayed in getting medications filled. Will continue to monitor.   Anticipated DC Date:  10/06/2014   Anticipated DC Plan:  [place]  CM consult      Choice offered to / List presented to:  C-1 Patient        [place] arranged  HH-1 RN  [place].   Status of service:  Completed, signed off Medicare Important Message given?  YES (If response is "NO", the following Medicare IM given date fields will be blank) Date Medicare IM given:  10/06/2014 Medicare IM given by:  GRAVES-BIGELOW,Shabnam Ladd Date Additional Medicare IM given:   Additional Medicare IM given by:    Discharge Disposition:  [place]  Per UR Regulation:  Reviewed for med. necessity/level of care/duration of stay  If discussed at [place] of Stay Meetings, dates discussed:   10/07/2014    Comments:  10-07-14 [address], RN,BSN [phone] CM did  speak with pt and he is agreeable to [facility] for CHF Management. CM did make referral with [facility] for [facility] services and [place] ot begin within 24-48 hrs post d/c. No further needs from CM at this time.  10-06-14 [address], [place] [phone] CM did speak with pt in regards to [facility] and pt is willing to think about this overnight. 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 CM did call [facility] Liaison to make her aware that pt is in hospital.

## 2014-10-04 NOTE — Progress Notes (Signed)
Patient: Chad Brown / Admit Date: 10/02/2014 / Date of Encounter: 10/04/2014, 7:53 AM   Subjective: Feeling better. Denies CP, dyspnea, palpitations, orthopnea.  Missed 3-4 doses of Xarelto PTA.  Objective: Telemetry: AF RVR 1teens-140s Physical Exam: Blood pressure 111/73, pulse 115, temperature 98.2 F (36.8 C), temperature source Oral, resp. rate 16, height 5\' 6"  (1.676 m), weight 214 lb 15.2 oz (97.5 kg), SpO2 95 %. General: Well developed, well nourished WM in no acute distress. Head: Normocephalic, atraumatic, sclera non-icteric, no xanthomas, nares are without discharge. Neck: JVP not elevated. Lungs: Clear bilaterally to auscultation without wheezes, rales, or rhonchi. Breathing is unlabored. Heart: Irregularly irregular, rapid rate, S1 S2 without murmurs, rubs, or gallops.  Abdomen: Soft, non-tender, non-distended with normoactive bowel sounds. No rebound/guarding. Extremities: No clubbing or cyanosis. No edema. Distal pedal pulses are 2+ and equal bilaterally. Neuro: Alert and oriented X 3. Moves all extremities spontaneously. Psych:  Responds to questions appropriately with a normal affect.   Intake/Output Summary (Last 24 hours) at 10/04/14 0753 Last data filed at 10/04/14 0508  Gross per 24 hour  Intake 1343.58 ml  Output   2425 ml  Net -1081.42 ml    Inpatient Medications:  . carvedilol  6.25 mg Oral BID WC  . dorzolamide-timolol  1 drop Right Eye BID  . finasteride  5 mg Oral Daily  . furosemide  40 mg Intravenous BID  . gabapentin  200 mg Oral q morning - 10a  . gabapentin  300 mg Oral QHS  . losartan  100 mg Oral Daily  . potassium chloride SA  20 mEq Oral BID  . pravastatin  80 mg Oral q1800  . rivaroxaban  20 mg Oral Q supper  . sodium chloride  3 mL Intravenous Q12H  . tamsulosin  0.4 mg Oral Daily   Infusions:  . diltiazem (CARDIZEM) infusion 5 mg/hr (10/03/14 2151)    Labs:  Recent Labs  10/02/14 1849 10/03/14 0608 10/04/14 0340  NA  --   137 142  K  --  3.6 4.3  CL  --  105 107  CO2  --  25 29  GLUCOSE  --  145* 106*  BUN  --  17 19  CREATININE  --  1.14 1.24  CALCIUM  --  7.6* 8.1*  MG 1.9  --   --     Recent Labs  10/03/14 0608  AST 14  ALT 18  ALKPHOS 55  BILITOT 0.9  PROT 5.6*  ALBUMIN 2.7*    Recent Labs  10/02/14 1001  WBC 9.5  HGB 11.8*  HCT 35.6*  MCV 91.0  PLT 137*    Recent Labs  10/02/14 1849 10/03/14 0005 10/03/14 0608  TROPONINI 0.06* 0.07* 0.07*   Invalid input(s): POCBNP  Recent Labs  10/02/14 1849  HGBA1C 6.5*     Radiology/Studies:  Dg Chest Port 1 View  10/02/2014   CLINICAL DATA:  Pt c/o shortness of breath x 3 days and 17 lb weight gain since 12/17. EKG done in triage shows pt to be in afib with RVR. Hx: HTN, MI, CHF  EXAM: PORTABLE CHEST - 1 VIEW  COMPARISON:  06/05/2013  FINDINGS: Stable cardiomegaly. Mild bibasilar interstitial opacities in both lung bases. No confluent airspace consolidation. No effusion. Degenerative changes in bilateral shoulders  IMPRESSION: 1. Stable cardiomegaly with mild bibasilar interstitial opacities, possibly subsegmental atelectasis versus early infiltrate.   Electronically Signed   By: Arne Cleveland M.D.   On: 10/02/2014 10:51  Assessment and Plan  1. Paroxysmal atrial fib with RVR - rate remains 120s-130s on current regimen with SBP 90's prior to AM meds. Fortunately he is minimally symptomatic and improved from CHF standpoint. Will tentatively d/c further Lasix, Losartan, Coreg for now and discuss further plan with MD. He is on a dilt gtt at 5mg /hr and long-term this is a poor choice given his LV dysfunction. Would consider changing Coreg to metoprolol or try to TEE/DCCV today instead of tomorrow. He just finished eating (8am). I have asked him not to eat or drink further this morning until we make a decision. 2. Hypotension - see above. 3. Acute on chronic systolic CHF felt 2/2 NICM (dx 12/2012, nonischemic nuc 03/2013) - weight back to  where he was in 06/2014 when he was euvolemic. See above -- in setting of hypotension will d/c further Lasix.  4. Elevated troponin felt due to demand ischemia - he denies anginal symptoms. Suspect we will continue surveillance for any anginal sx for now. 5. Mild anemia/thrombocytopenia - denies bleeding. Follow. 6. Hyperglycemia A1C 6.5 - will need to f/u PCP for probable newly diagnosed DM. 7. Sleep disordered breathing/obesity - Body mass index is 34.71 kg/(m^2).- will need sleep study as outpatient.  Signed, Melina Copa PA-C  As above, patient seen and examined. Patient is symptomatically improved. He has mild residual dyspnea. He remains in atrial fibrillation with elevated heart rate and blood pressure is borderline. We'll continue low-dose Cardizem and carvedilol. Hold Cozaar and Lasix today. Continue xarelto. Plan to proceed with TEE guided cardioversion tomorrow (schedule will not allow today). If he does not hold sinus rhythm he would require antiarrhythmic therapy and would most likely use tikosyn. Once sinus has been reestablished will titrate beta blocker and resume ARB and lower dose diuretic. He will need reassessment of LV function in approximately 3 months. If ejection fraction continues to be less than 35% would consider cardiac catheterization and possible referral for ICD. Sleep study as outpt. Kirk Ruths

## 2014-10-05 ENCOUNTER — Encounter (HOSPITAL_COMMUNITY): Payer: Self-pay | Admitting: *Deleted

## 2014-10-05 ENCOUNTER — Encounter (HOSPITAL_COMMUNITY)
Admission: EM | Disposition: A | Discharge status: Home / Self Care | Payer: Medicare Other | Source: Home / Self Care | Attending: Internal Medicine

## 2014-10-05 ENCOUNTER — Inpatient Hospital Stay (HOSPITAL_COMMUNITY): Payer: Medicare Other | Admitting: Anesthesiology

## 2014-10-05 DIAGNOSIS — I4891 Unspecified atrial fibrillation: Secondary | ICD-10-CM

## 2014-10-05 DIAGNOSIS — I340 Nonrheumatic mitral (valve) insufficiency: Secondary | ICD-10-CM

## 2014-10-05 DIAGNOSIS — I959 Hypotension, unspecified: Secondary | ICD-10-CM

## 2014-10-05 DIAGNOSIS — I5023 Acute on chronic systolic (congestive) heart failure: Principal | ICD-10-CM

## 2014-10-05 HISTORY — PX: TEE WITHOUT CARDIOVERSION: SHX5443

## 2014-10-05 LAB — BASIC METABOLIC PANEL
Anion gap: 8 (ref 5–15)
BUN: 16 mg/dL (ref 6–23)
CALCIUM: 8 mg/dL — AB (ref 8.4–10.5)
CO2: 27 mmol/L (ref 19–32)
Chloride: 106 mEq/L (ref 96–112)
Creatinine, Ser: 1.02 mg/dL (ref 0.50–1.35)
GFR, EST AFRICAN AMERICAN: 82 mL/min — AB (ref >90)
GFR, EST NON AFRICAN AMERICAN: 71 mL/min — AB (ref >90)
Glucose, Bld: 113 mg/dL — ABNORMAL HIGH (ref 70–99)
POTASSIUM: 4.1 mmol/L (ref 3.5–5.1)
Sodium: 141 mmol/L (ref 135–145)

## 2014-10-05 LAB — CBC
HCT: 32.7 % — ABNORMAL LOW (ref 39.0–52.0)
Hemoglobin: 10.5 g/dL — ABNORMAL LOW (ref 13.0–17.0)
MCH: 28.9 pg (ref 26.0–34.0)
MCHC: 32.1 g/dL (ref 30.0–36.0)
MCV: 90.1 fL (ref 78.0–100.0)
Platelets: 142 10*3/uL — ABNORMAL LOW (ref 150–400)
RBC: 3.63 MIL/uL — AB (ref 4.22–5.81)
RDW: 15.6 % — ABNORMAL HIGH (ref 11.5–15.5)
WBC: 9.5 10*3/uL (ref 4.0–10.5)

## 2014-10-05 SURGERY — ECHOCARDIOGRAM, TRANSESOPHAGEAL
Anesthesia: General

## 2014-10-05 MED ORDER — PROPOFOL INFUSION 10 MG/ML OPTIME
INTRAVENOUS | Status: DC | PRN
  Administered 2014-10-05: 75 ug/kg/min via INTRAVENOUS

## 2014-10-05 MED ORDER — ONDANSETRON HCL 4 MG/2ML IJ SOLN: 4.0000 mg | Freq: Once | INTRAMUSCULAR | Status: DC | PRN

## 2014-10-05 MED ORDER — PROPOFOL 10 MG/ML IV BOLUS
INTRAVENOUS | Status: DC | PRN
  Administered 2014-10-05: 20 mg via INTRAVENOUS

## 2014-10-05 MED ORDER — BUTAMBEN-TETRACAINE-BENZOCAINE 2-2-14 % EX AERO
INHALATION_SPRAY | CUTANEOUS | Status: DC | PRN
  Administered 2014-10-05: 2 via TOPICAL

## 2014-10-05 MED ORDER — FENTANYL CITRATE 0.05 MG/ML IJ SOLN: 25.0000 ug | INTRAMUSCULAR | Status: DC | PRN

## 2014-10-05 MED ORDER — SODIUM CHLORIDE 0.9 % IV SOLN
INTRAVENOUS | Status: DC
  Administered 2014-10-05: 08:00:00 via INTRAVENOUS

## 2014-10-05 NOTE — H&P (View-Only) (Signed)
Patient Name: Chad Brown Date of Encounter: 10/05/2014     Principal Problem:   Atrial fibrillation with rapid ventricular response Active Problems:   Sinus bradycardia   Acute on chronic systolic CHF (congestive heart failure)   Hypotension   Nonischemic cardiomyopathy   Hyperglycemia   Troponin level elevated   Sleep-disordered breathing   Obesity   Anemia, mild   Thrombocytopenia    SUBJECTIVE  Denies any CP or SOB, occasional fluttering sensation, however not consistent. Aware of today's TEE cardioversion, said this is the 2nd time he's having cardioversion.   CURRENT MEDS . carvedilol  6.25 mg Oral BID WC  . dorzolamide-timolol  1 drop Right Eye BID  . finasteride  5 mg Oral Daily  . gabapentin  200 mg Oral q morning - 10a  . gabapentin  300 mg Oral QHS  . pravastatin  80 mg Oral q1800  . rivaroxaban  20 mg Oral Q supper  . sodium chloride  3 mL Intravenous Q12H  . tamsulosin  0.4 mg Oral Daily    OBJECTIVE  Filed Vitals:   10/04/14 1900 10/04/14 2123 10/05/14 0038 10/05/14 0400  BP: 132/71 117/79 131/77 127/90  Pulse:  113 97 102  Temp: 98.2 F (36.8 C) 97.8 F (36.6 C) 97.5 F (36.4 C) 98.1 F (36.7 C)  TempSrc: Oral Oral Oral   Resp: 22 20 17  74  Height:      Weight:    215 lb 1.6 oz (97.569 kg)  SpO2: 100% 96% 97% 95%    Intake/Output Summary (Last 24 hours) at 10/05/14 0743 Last data filed at 10/05/14 0600  Gross per 24 hour  Intake   1148 ml  Output   1150 ml  Net     -2 ml   Filed Weights   10/03/14 0521 10/04/14 0507 10/05/14 0400  Weight: 218 lb 9.6 oz (99.156 kg) 214 lb 15.2 oz (97.5 kg) 215 lb 1.6 oz (97.569 kg)    PHYSICAL EXAM  General: Pleasant, NAD. Neuro: Alert and oriented X 3. Moves all extremities spontaneously. Psych: Normal affect. HEENT:  Normal  Neck: Supple without bruits or JVD. Lungs:  Resp regular and unlabored, CTA. Heart: irregular no s3, s4, or murmurs. Abdomen: Soft, non-tender, non-distended, BS +  x 4.  Extremities: No clubbing, cyanosis or edema. DP/PT/Radials 2+ and equal bilaterally.  Accessory Clinical Findings  CBC  Recent Labs  10/02/14 1001 10/05/14 0345  WBC 9.5 9.5  HGB 11.8* 10.5*  HCT 35.6* 32.7*  MCV 91.0 90.1  PLT 137* [id number]*   Basic Metabolic Panel  Recent Labs  10/02/14 1849  10/04/14 0340 10/05/14 0345  NA  --   < > 142 141  K  --   < > 4.3 4.1  CL  --   < > 107 106  CO2  --   < > 29 27  GLUCOSE  --   < > 106* 113*  BUN  --   < > 19 16  CREATININE  --   < > 1.24 1.02  CALCIUM  --   < > 8.1* 8.0*  MG 1.9  --   --   --   < > = values in this interval not displayed. Liver Function Tests  Recent Labs  10/03/14 0608  AST 14  ALT 18  ALKPHOS 55  BILITOT 0.9  PROT 5.6*  ALBUMIN 2.7*   No results for input(s): LIPASE, AMYLASE in the last 72 hours. Cardiac Enzymes  Recent  Labs  10/02/14 1849 10/03/14 0005 10/03/14 0608  TROPONINI 0.06* 0.07* 0.07*   Hemoglobin A1C  Recent Labs  10/02/14 1849  HGBA1C 6.5*    TELE A-fib with HR 100-120s    ECG  A-fib with RVR  Echocardiogram 01/26/2013  LV EF: 30% -  35%  ------------------------------------------------------------ Indications:   Atrial fibrillation - 427.31.  ------------------------------------------------------------ Study Conclusions  - Left ventricle: Systolic function was moderately to severely reduced. The estimated ejection fraction was in the range of 30% to 35%. Diffuse hypokinesis. - Aortic valve: Mildly calcified annulus. - Mitral valve: No evidence of vegetation. Mild regurgitation. - Left atrium: The atrium was moderately dilated. No evidence of thrombus in the atrial cavity or appendage. - Atrial septum: No defect or patent foramen ovale was identified. - Tricuspid valve: No evidence of vegetation. - Pulmonic valve: No evidence of vegetation.    Radiology/Studies  Dg Chest Port 1 View  10/02/2014   CLINICAL DATA:  Pt c/o shortness  of breath x 3 days and 17 lb weight gain since 12/17. EKG done in triage shows pt to be in afib with RVR. Hx: HTN, MI, CHF  EXAM: PORTABLE CHEST - 1 VIEW  COMPARISON:  06/05/2013  FINDINGS: Stable cardiomegaly. Mild bibasilar interstitial opacities in both lung bases. No confluent airspace consolidation. No effusion. Degenerative changes in bilateral shoulders  IMPRESSION: 1. Stable cardiomegaly with mild bibasilar interstitial opacities, possibly subsegmental atelectasis versus early infiltrate.   Electronically Signed   By: Arne Cleveland M.D.   On: 10/02/2014 10:51    ASSESSMENT AND PLAN  1. PAF with RVR  - CHADS-Vasc score 4  - missed 3-4 doses of Xarelto prior to admission  - pending TEE w/ cardioversion today  - if has recurrent a-fib after cardioversion, will need antiarrythmic medication (per Dr. Stanford Breed, likely tikosyn, depend on QTc level after cardioversion).   2. Hypotension  3. Acute on chronic systolic CHF felt 2/2 NICM  - Euvolemic, weight back to Sep baseline  - cozaar and lasix on holding pending TEE cardioversion, will resume afterward depend on BP  - if EF <35% on TEE, will consider cardiac cath and ICD referral (EF 30-35% since Apr 2014)  4. Elevated trop likely due to demand ischemia 5. Mild anemia/Thormbocytopenia 6. Hyperglycemia A1C 6.5: followup with PCP  7. Sleep disorder/obesity: need outpt sleep study   Hilbert Corrigan PA-C Pager: [phone] As above, patient seen and examined. His dyspnea has improved. No chest pain. He remains in atrial fibrillation with elevated ventricular response. Continue Cardizem, carvedilol and xarelto. For TEE guided cardioversion today. If he does not hold sinus rhythm he would most likely need tikosyn. Once sinus has been reestablished will resume ARB and diuretic. Titrate carvedilol. Repeat echocardiogram 3 months after sinus reestablished and full titration of medications. His ejection fraction less than 35% would need cardiac  catheterization and possible referral for ICD. Sleep study as an outpatient. Kirk Ruths

## 2014-10-05 NOTE — CV Procedure (Signed)
    Transesophageal Echocardiogram Note  Chad Brown [id number] 07-09-1941  Procedure: Transesophageal Echocardiogram Indications: atrial fib   Procedure Details Consent: Obtained Time Out: Verified patient identification, verified procedure, site/side was marked, verified correct patient position, special equipment/implants available, Radiology Safety Procedures followed,  medications/allergies/relevent history reviewed, required imaging and test results available.  Performed  Medications: Fentanyl:  Versed:  Propofol 130 mg total for TEE and cardioversion   Left Ventrical:  Mod - severe LV dysfunction.  EF 30%  Mitral Valve: moderate MR   Aortic Valve: normal valve, trivial AI  Tricuspid Valve: moderate TR   Pulmonic Valve: normla   Left Atrium/ Left atrial appendage: clear, no thrombi   Atrial septum: no ASD or PFO by color doppler   Aorta:    Complications: No apparent complications Patient did tolerate procedure well.  Will proceed with cardioversion      Cardioversion Note  Chad Brown [id number] 05/05/1941  Procedure: DC Cardioversion Indications: atrial fib   Procedure Details Consent: Obtained Time Out: Verified patient identification, verified procedure, site/side was marked, verified correct patient position, special equipment/implants available, Radiology Safety Procedures followed,  medications/allergies/relevent history reviewed, required imaging and test results available.  Performed  The patient has been on adequate anticoagulation.  The patient received IV propofol ( see above)  for sedation.  Synchronous cardioversion was performed at 120  joules.  The cardioversion was successful     Complications: No apparent complications Patient did tolerate procedure well.   Thayer Headings, [name], MD, [facility] 10/05/2014, 12:57 PM

## 2014-10-05 NOTE — Transfer of Care (Signed)
Immediate Anesthesia Transfer of Care Note  Patient: Chad Brown  Procedure(s) Performed: Procedure(s) with comments: TRANSESOPHAGEAL ECHOCARDIOGRAM (TEE)  with cardioversion (N/A) - 12:52 synched cardioversion at 120 joules,...afib to SR...12 lead EKG ordered.Marland KitchenMarland KitchenCardiozem d/c'ed per MD verbal order at [place]  Patient Location: Endoscopy Unit  Anesthesia Type:General  Level of Consciousness: awake and alert   Airway & Oxygen Therapy: Patient Spontanous Breathing and Patient connected to nasal cannula oxygen  Post-op Assessment: Report given to PACU RN, Post -op Vital signs reviewed and stable and Patient moving all extremities  Post vital signs: Reviewed and stable  Complications: No apparent anesthesia complications

## 2014-10-05 NOTE — Progress Notes (Signed)
  Echocardiogram 2D Echocardiogram has been performed.  Chad Brown 10/05/2014, 1:05 PM

## 2014-10-05 NOTE — Progress Notes (Signed)
CHADS VASC=4 

## 2014-10-05 NOTE — Interval H&P Note (Signed)
History and Physical Interval Note:  10/05/2014 12:41 PM  Chad Brown  has presented today for surgery, with the diagnosis of afib  The various methods of treatment have been discussed with the patient and family. After consideration of risks, benefits and other options for treatment, the patient has consented to  Procedure(s): TRANSESOPHAGEAL ECHOCARDIOGRAM (TEE)  with cardioversion (N/A) as a surgical intervention .  The patient's history has been reviewed, patient examined, no change in status, stable for surgery.  I have reviewed the patient's chart and labs.  Questions were answered to the patient's satisfaction.     Ripken Rekowski, Wonda Cheng

## 2014-10-05 NOTE — Anesthesia Procedure Notes (Signed)
Procedure Name: MAC Date/Time: 10/05/2014 12:30 PM Performed by: Suzy Bouchard Pre-anesthesia Checklist: Patient identified, Suction available, Patient being monitored, Emergency Drugs available and Timeout performed Oxygen Delivery Method: Nasal cannula

## 2014-10-05 NOTE — Anesthesia Postprocedure Evaluation (Signed)
  Anesthesia Post-op Note  Patient: [name]  Procedure(s) Performed: Procedure(s) with comments: TRANSESOPHAGEAL ECHOCARDIOGRAM (TEE)  with cardioversion (N/A) - 12:52 synched cardioversion at 120 joules,...afib to SR...12 lead EKG ordered.Marland KitchenMarland KitchenCardiozem d/c'ed per MD verbal order at [place]  Patient Location: PACU  Anesthesia Type:MAC  Level of Consciousness: awake, alert  and oriented  Airway and Oxygen Therapy: Patient Spontanous Breathing and Patient connected to nasal cannula oxygen  Post-op Pain: none  Post-op Assessment: Post-op Vital signs reviewed, Patient's Cardiovascular Status Stable, Respiratory Function Stable, Patent Airway and Pain level controlled  Post-op Vital Signs: stable  Last Vitals:  Filed Vitals:   10/05/14 1338  BP: 110/80  Pulse: 64  Temp: 36.7 C  Resp: 18    Complications: No apparent anesthesia complications

## 2014-10-05 NOTE — Anesthesia Preprocedure Evaluation (Addendum)
Anesthesia Evaluation  Patient identified by MRN, date of birth, ID band Patient awake    Reviewed: Allergy & Precautions, NPO status , Patient's Chart, lab work & pertinent test results  Airway Mallampati: II  TM Distance: >3 FB Neck ROM: Full    Dental  (+) Edentulous Upper, Edentulous Lower, Dental Advisory Given   Pulmonary former smoker,          Cardiovascular hypertension, Pt. on medications + Past MI and +CHF Rhythm:Irregular Rate:Tachycardia     Neuro/Psych    GI/Hepatic   Endo/Other    Renal/GU      Musculoskeletal   Abdominal   Peds  Hematology   Anesthesia Other Findings   Reproductive/Obstetrics                           Anesthesia Physical Anesthesia Plan  ASA: III  Anesthesia Plan: General   Post-op Pain Management:    Induction: Intravenous  Airway Management Planned: Oral ETT  Additional Equipment:   Intra-op Plan:   Post-operative Plan:   Informed Consent: I have reviewed the patients History and Physical, chart, labs and discussed the procedure including the risks, benefits and alternatives for the proposed anesthesia with the patient or authorized representative who has indicated his/her understanding and acceptance.   Dental advisory given  Plan Discussed with: CRNA and Anesthesiologist  Anesthesia Plan Comments:         Anesthesia Quick Evaluation

## 2014-10-05 NOTE — Progress Notes (Signed)
Patient Name: Chad Brown Date of Encounter: 10/05/2014     Principal Problem:   Atrial fibrillation with rapid ventricular response Active Problems:   Sinus bradycardia   Acute on chronic systolic CHF (congestive heart failure)   Hypotension   Nonischemic cardiomyopathy   Hyperglycemia   Troponin level elevated   Sleep-disordered breathing   Obesity   Anemia, mild   Thrombocytopenia    SUBJECTIVE  Denies any CP or SOB, occasional fluttering sensation, however not consistent. Aware of today's TEE cardioversion, said this is the 2nd time he's having cardioversion.   CURRENT MEDS . carvedilol  6.25 mg Oral BID WC  . dorzolamide-timolol  1 drop Right Eye BID  . finasteride  5 mg Oral Daily  . gabapentin  200 mg Oral q morning - 10a  . gabapentin  300 mg Oral QHS  . pravastatin  80 mg Oral q1800  . rivaroxaban  20 mg Oral Q supper  . sodium chloride  3 mL Intravenous Q12H  . tamsulosin  0.4 mg Oral Daily    OBJECTIVE  Filed Vitals:   10/04/14 1900 10/04/14 2123 10/05/14 0038 10/05/14 0400  BP: 132/71 117/79 131/77 127/90  Pulse:  113 97 102  Temp: 98.2 F (36.8 C) 97.8 F (36.6 C) 97.5 F (36.4 C) 98.1 F (36.7 C)  TempSrc: Oral Oral Oral   Resp: 22 20 17  74  Height:      Weight:    215 lb 1.6 oz (97.569 kg)  SpO2: 100% 96% 97% 95%    Intake/Output Summary (Last 24 hours) at 10/05/14 0743 Last data filed at 10/05/14 0600  Gross per 24 hour  Intake   1148 ml  Output   1150 ml  Net     -2 ml   Filed Weights   10/03/14 0521 10/04/14 0507 10/05/14 0400  Weight: 218 lb 9.6 oz (99.156 kg) 214 lb 15.2 oz (97.5 kg) 215 lb 1.6 oz (97.569 kg)    PHYSICAL EXAM  General: Pleasant, NAD. Neuro: Alert and oriented X 3. Moves all extremities spontaneously. Psych: Normal affect. HEENT:  Normal  Neck: Supple without bruits or JVD. Lungs:  Resp regular and unlabored, CTA. Heart: irregular no s3, s4, or murmurs. Abdomen: Soft, non-tender, non-distended, BS +  x 4.  Extremities: No clubbing, cyanosis or edema. DP/PT/Radials 2+ and equal bilaterally.  Accessory Clinical Findings  CBC  Recent Labs  10/02/14 1001 10/05/14 0345  WBC 9.5 9.5  HGB 11.8* 10.5*  HCT 35.6* 32.7*  MCV 91.0 90.1  PLT 137* [id number]*   Basic Metabolic Panel  Recent Labs  10/02/14 1849  10/04/14 0340 10/05/14 0345  NA  --   < > 142 141  K  --   < > 4.3 4.1  CL  --   < > 107 106  CO2  --   < > 29 27  GLUCOSE  --   < > 106* 113*  BUN  --   < > 19 16  CREATININE  --   < > 1.24 1.02  CALCIUM  --   < > 8.1* 8.0*  MG 1.9  --   --   --   < > = values in this interval not displayed. Liver Function Tests  Recent Labs  10/03/14 0608  AST 14  ALT 18  ALKPHOS 55  BILITOT 0.9  PROT 5.6*  ALBUMIN 2.7*   No results for input(s): LIPASE, AMYLASE in the last 72 hours. Cardiac Enzymes  Recent  Labs  10/02/14 1849 10/03/14 0005 10/03/14 0608  TROPONINI 0.06* 0.07* 0.07*   Hemoglobin A1C  Recent Labs  10/02/14 1849  HGBA1C 6.5*    TELE A-fib with HR 100-120s    ECG  A-fib with RVR  Echocardiogram 01/26/2013  LV EF: 30% -  35%  ------------------------------------------------------------ Indications:   Atrial fibrillation - 427.31.  ------------------------------------------------------------ Study Conclusions  - Left ventricle: Systolic function was moderately to severely reduced. The estimated ejection fraction was in the range of 30% to 35%. Diffuse hypokinesis. - Aortic valve: Mildly calcified annulus. - Mitral valve: No evidence of vegetation. Mild regurgitation. - Left atrium: The atrium was moderately dilated. No evidence of thrombus in the atrial cavity or appendage. - Atrial septum: No defect or patent foramen ovale was identified. - Tricuspid valve: No evidence of vegetation. - Pulmonic valve: No evidence of vegetation.    Radiology/Studies  Dg Chest Port 1 View  10/02/2014   CLINICAL DATA:  Pt c/o shortness  of breath x 3 days and 17 lb weight gain since 12/17. EKG done in triage shows pt to be in afib with RVR. Hx: HTN, MI, CHF  EXAM: PORTABLE CHEST - 1 VIEW  COMPARISON:  06/05/2013  FINDINGS: Stable cardiomegaly. Mild bibasilar interstitial opacities in both lung bases. No confluent airspace consolidation. No effusion. Degenerative changes in bilateral shoulders  IMPRESSION: 1. Stable cardiomegaly with mild bibasilar interstitial opacities, possibly subsegmental atelectasis versus early infiltrate.   Electronically Signed   By: Arne Cleveland M.D.   On: 10/02/2014 10:51    ASSESSMENT AND PLAN  1. PAF with RVR  - CHADS-Vasc score 4  - missed 3-4 doses of Xarelto prior to admission  - pending TEE w/ cardioversion today  - if has recurrent a-fib after cardioversion, will need antiarrythmic medication (per Dr. Stanford Breed, likely tikosyn, depend on QTc level after cardioversion).   2. Hypotension  3. Acute on chronic systolic CHF felt 2/2 NICM  - Euvolemic, weight back to Sep baseline  - cozaar and lasix on holding pending TEE cardioversion, will resume afterward depend on BP  - if EF <35% on TEE, will consider cardiac cath and ICD referral (EF 30-35% since Apr 2014)  4. Elevated trop likely due to demand ischemia 5. Mild anemia/Thormbocytopenia 6. Hyperglycemia A1C 6.5: followup with PCP  7. Sleep disorder/obesity: need outpt sleep study   Hilbert Corrigan PA-C Pager: [phone] As above, patient seen and examined. His dyspnea has improved. No chest pain. He remains in atrial fibrillation with elevated ventricular response. Continue Cardizem, carvedilol and xarelto. For TEE guided cardioversion today. If he does not hold sinus rhythm he would most likely need tikosyn. Once sinus has been reestablished will resume ARB and diuretic. Titrate carvedilol. Repeat echocardiogram 3 months after sinus reestablished and full titration of medications. His ejection fraction less than 35% would need cardiac  catheterization and possible referral for ICD. Sleep study as an outpatient. Kirk Ruths

## 2014-10-06 ENCOUNTER — Encounter (HOSPITAL_COMMUNITY): Payer: Self-pay | Admitting: Cardiovascular Disease

## 2014-10-06 LAB — URINALYSIS, ROUTINE W REFLEX MICROSCOPIC
Bilirubin Urine: NEGATIVE
GLUCOSE, UA: NEGATIVE mg/dL
HGB URINE DIPSTICK: NEGATIVE
KETONES UR: NEGATIVE mg/dL
LEUKOCYTES UA: NEGATIVE
Nitrite: NEGATIVE
PH: 5.5 (ref 5.0–8.0)
Protein, ur: NEGATIVE mg/dL
Specific Gravity, Urine: 1.02 (ref 1.005–1.030)
Urobilinogen, UA: 1 mg/dL (ref 0.0–1.0)

## 2014-10-06 LAB — BASIC METABOLIC PANEL
ANION GAP: 6 (ref 5–15)
BUN: 16 mg/dL (ref 6–23)
CO2: 27 mmol/L (ref 19–32)
Calcium: 8.2 mg/dL — ABNORMAL LOW (ref 8.4–10.5)
Chloride: 108 mEq/L (ref 96–112)
Creatinine, Ser: 1.13 mg/dL (ref 0.50–1.35)
GFR, EST AFRICAN AMERICAN: 73 mL/min — AB (ref >90)
GFR, EST NON AFRICAN AMERICAN: 63 mL/min — AB (ref >90)
Glucose, Bld: 114 mg/dL — ABNORMAL HIGH (ref 70–99)
Potassium: 4.2 mmol/L (ref 3.5–5.1)
Sodium: 141 mmol/L (ref 135–145)

## 2014-10-06 LAB — CBC
HCT: 32.4 % — ABNORMAL LOW (ref 39.0–52.0)
Hemoglobin: 10.4 g/dL — ABNORMAL LOW (ref 13.0–17.0)
MCH: 29.5 pg (ref 26.0–34.0)
MCHC: 32.1 g/dL (ref 30.0–36.0)
MCV: 92 fL (ref 78.0–100.0)
Platelets: 139 10*3/uL — ABNORMAL LOW (ref 150–400)
RBC: 3.52 MIL/uL — ABNORMAL LOW (ref 4.22–5.81)
RDW: 15.8 % — ABNORMAL HIGH (ref 11.5–15.5)
WBC: 10 10*3/uL (ref 4.0–10.5)

## 2014-10-06 LAB — MAGNESIUM: Magnesium: 2.3 mg/dL (ref 1.5–2.5)

## 2014-10-06 MED ORDER — AMIODARONE HCL 200 MG PO TABS
400.0000 mg | ORAL_TABLET | Freq: Every day | ORAL | Status: DC
  Administered 2014-10-07 – 2014-10-08 (×2): 400 mg via ORAL
  Filled 2014-10-06 (×2): qty 2

## 2014-10-06 MED ORDER — FUROSEMIDE 10 MG/ML IJ SOLN
40.0000 mg | Freq: Every day | INTRAMUSCULAR | Status: DC
  Administered 2014-10-06 – 2014-10-08 (×3): 40 mg via INTRAVENOUS
  Filled 2014-10-06 (×3): qty 4

## 2014-10-06 MED ORDER — LOSARTAN POTASSIUM 50 MG PO TABS
50.0000 mg | ORAL_TABLET | Freq: Every day | ORAL | Status: DC
  Administered 2014-10-06 – 2014-10-07 (×2): 50 mg via ORAL
  Filled 2014-10-06 (×2): qty 1

## 2014-10-06 MED ORDER — CARVEDILOL 12.5 MG PO TABS
12.5000 mg | ORAL_TABLET | Freq: Two times a day (BID) | ORAL | Status: DC
  Administered 2014-10-06 – 2014-10-08 (×4): 12.5 mg via ORAL
  Filled 2014-10-06 (×2): qty 1
  Filled 2014-10-06: qty 2
  Filled 2014-10-06: qty 1

## 2014-10-06 NOTE — Consult Note (Signed)
Reason for Consult: Antiarrythmic Drug Recommendations Referring Physician: Dr. Stanford Breed Primary Cardiologist: Dr. Johnsie Cancel   HPI: The patient is a 74 y/o male formerly followed by Dr. Lia Foyer and now followed by Dr. Johnsie Cancel with a history of PAF on Xarelto and chronic systolic HF due to left ventricular dysfunction with an EF of 30-35%. Prior records indicate MI in 1998, however [place] during that time revealed normal coronaries. Medical therapy was elected as coronary spasm was suspected and he was placed on Norvasc.  His last ischemic eval was a NST 03/2013 that was negative for ischemia.   He  presented to [facility] 10/02/14 with complaints of increasing dyspnea + orthopnea as well as weight gain. On arrival, he was noted to be in atrial fibrillation with RVR and a/c CHF. He ultimately underwent successful TEE DCCV 10/04/13 and NSR was restored. He has maintained SR since cardioversion, however aggressive titration of rate control agents in the past has been limited by sinus bradycardia. EP has been consulted for recommendations regarding antiarrythmic therapy in an effort to maintain NSR. Current medications for his Afib include 12.5 mg of Coreg BID and 20 mg of Xarelto daily. He is currently in SR with a HR in the 70s. He denies any current symptoms.     Past Medical History  Diagnosis Date  . Hyperlipidemia   . Hypertension   . Myocardial infarction 1998  . Systolic CHF     a. New dx 12/2012 ?NICM, may be r/t afib. b. Nuc 03/2013 - normal.  . PAF (paroxysmal atrial fibrillation)     a. Dx 12/2012, s/p TEE/DCCV 01/26/13. b. On Xarelto.  . Arthritis     "about all my joints" (01/26/2013)  . Chronic back pain   . 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 Sinus bradycardia 10/02/2014    Past Surgical History  Procedure Laterality Date  . Joint replacement      hip  . Eye surgery      glaucoma & cataract  . Back surgery  03/2000    "ground calcium deposits from upper thoracic" (01/26/2013)  . Total hip  arthroplasty Right 06/2007  . Cataract extraction Right ~ 2006  . Glaucoma surgery Right ~ 2006    "put 3 stents in to drain fluid" (01/26/2013)  . Refractive surgery Right ~ 2006    "twice; both done at [place]" (01/26/2013)  . Surgery scrotal / testicular Right 1990's  . Incision and drainage abscess posterior cervicalspine  05/2012  . Cardiac catheterization  1998  . Tee without cardioversion N/A 01/26/2013    Procedure: TRANSESOPHAGEAL ECHOCARDIOGRAM (TEE);  Surgeon: Lelon Perla, MD;  Location: [place];  Service: Cardiovascular;  Laterality: N/A;  [name] anes. /   . Cardioversion N/A 01/26/2013    Procedure: CARDIOVERSION;  Surgeon: Lelon Perla, MD;  Location: [facility] ENDOSCOPY;  Service: Cardiovascular;  Laterality: N/A;  . Tee without cardioversion N/A 10/05/2014    Procedure: TRANSESOPHAGEAL ECHOCARDIOGRAM (TEE)  with cardioversion;  Surgeon: Thayer Headings, MD;  Location: [facility] ENDOSCOPY;  Service: Cardiovascular;  Laterality: N/A;  12:52 synched cardioversion at 120 joules,...afib to SR...12 lead EKG ordered.Marland KitchenMarland KitchenCardiozem d/c'ed per MD verbal order at SR    Family History  Problem Relation Age of Onset  . Kidney disease Mother   . Heart disease Mother   . Diabetes Mother   . Cancer Father     leukemia    Social History:  reports that he quit smoking about 14 years ago. His  smoking use included Cigarettes. He has a 144 pack-year smoking history. He has never used smokeless tobacco. He reports that he does not drink alcohol or use illicit drugs.  Allergies:  Allergies  Allergen Reactions  . Zithromax [Azithromycin] Diarrhea    Medications:  Current Facility-Administered Medications  Medication Dose Route Frequency Provider Last Rate Last Dose  . 0.9 %  sodium chloride infusion  250 mL Intravenous PRN Rhonda G Barrett, PA-C      . acetaminophen (TYLENOL) tablet 650 mg  650 mg Oral Q4H PRN Rhonda G Barrett, PA-C      . ALPRAZolam Duanne Moron) tablet 0.25 mg  0.25 mg Oral BID PRN  Evelene Croon Barrett, PA-C   0.25 mg at 10/05/14 2104  . carvedilol (COREG) tablet 12.5 mg  12.5 mg Oral BID WC Lelon Perla, MD      . dorzolamide-timolol (COSOPT) 22.3-6.8 MG/ML ophthalmic solution 1 drop  1 drop Right Eye BID Evelene Croon Barrett, PA-C   1 drop at 10/06/14 1039  . finasteride (PROSCAR) tablet 5 mg  5 mg Oral Daily Rhonda G Barrett, PA-C   5 mg at 10/06/14 1039  . furosemide (LASIX) injection 40 mg  40 mg Intravenous Daily Lelon Perla, MD   40 mg at 10/06/14 1039  . gabapentin (NEURONTIN) capsule 200 mg  200 mg Oral q morning - 10a Lauren Bajbus, RPH   200 mg at 10/06/14 1038  . gabapentin (NEURONTIN) capsule 300 mg  300 mg Oral QHS Lauren Bajbus, RPH   300 mg at 10/05/14 2120  . losartan (COZAAR) tablet 50 mg  50 mg Oral Daily Lelon Perla, MD   50 mg at 10/06/14 1103  . nitroGLYCERIN (NITROSTAT) SL tablet 0.4 mg  0.4 mg Sublingual Q5 Min x 3 PRN Rhonda G Barrett, PA-C      . ondansetron (ZOFRAN) injection 4 mg  4 mg Intravenous Q6H PRN Rhonda G Barrett, PA-C      . pravastatin (PRAVACHOL) tablet 80 mg  80 mg Oral q1800 Rhonda G Barrett, PA-C   80 mg at 10/05/14 1717  . rivaroxaban (XARELTO) tablet 20 mg  20 mg Oral Q supper Evelene Croon Barrett, PA-C   20 mg at 10/05/14 1717  . sodium chloride 0.9 % injection 3 mL  3 mL Intravenous Q12H Rhonda G Barrett, PA-C   3 mL at 10/05/14 2120  . sodium chloride 0.9 % injection 3 mL  3 mL Intravenous PRN Rhonda G Barrett, PA-C      . tamsulosin (FLOMAX) capsule 0.4 mg  0.4 mg Oral Daily Rhonda G Barrett, PA-C   0.4 mg at 10/06/14 1039  . zolpidem (AMBIEN) tablet 5 mg  5 mg Oral QHS PRN Lonn Georgia, PA-C        Results for orders placed or performed during the hospital encounter of 10/02/14 (from the past 48 hour(s))  Basic metabolic panel     Status: Abnormal   Collection Time: 10/05/14  3:45 AM  Result Value Ref Range   Sodium 141 135 - 145 mmol/L    Comment: Please note change in reference range.   Potassium 4.1 3.5 - 5.1  mmol/L    Comment: Please note change in reference range.   Chloride 106 96 - 112 mEq/L   CO2 27 19 - 32 mmol/L   Glucose, Bld 113 (H) 70 - 99 mg/dL   BUN 16 6 - 23 mg/dL   Creatinine, Ser 1.02 0.50 - 1.35 mg/dL   Calcium  8.0 (L) 8.4 - 10.5 mg/dL   GFR calc non Af Amer 71 (L) >90 mL/min   GFR calc Af Amer 82 (L) >90 mL/min    Comment: (NOTE) The eGFR has been calculated using the CKD EPI equation. This calculation has not been validated in all clinical situations. eGFR's persistently <90 mL/min signify possible Chronic Kidney Disease.    Anion gap 8 5 - 15  CBC     Status: Abnormal   Collection Time: 10/05/14  3:45 AM  Result Value Ref Range   WBC 9.5 4.0 - 10.5 K/uL   RBC 3.63 (L) 4.22 - 5.81 MIL/uL   Hemoglobin 10.5 (L) 13.0 - 17.0 g/dL   HCT 32.7 (L) 39.0 - 52.0 %   MCV 90.1 78.0 - 100.0 fL   MCH 28.9 26.0 - 34.0 pg   MCHC 32.1 30.0 - 36.0 g/dL   RDW 15.6 (H) 11.5 - 15.5 %   Platelets 142 (L) 150 - 400 K/uL  Basic metabolic panel     Status: Abnormal   Collection Time: 10/06/14  3:53 AM  Result Value Ref Range   Sodium 141 135 - 145 mmol/L    Comment: Please note change in reference range.   Potassium 4.2 3.5 - 5.1 mmol/L    Comment: Please note change in reference range.   Chloride 108 96 - 112 mEq/L   CO2 27 19 - 32 mmol/L   Glucose, Bld 114 (H) 70 - 99 mg/dL   BUN 16 6 - 23 mg/dL   Creatinine, Ser 1.13 0.50 - 1.35 mg/dL   Calcium 8.2 (L) 8.4 - 10.5 mg/dL   GFR calc non Af Amer 63 (L) >90 mL/min   GFR calc Af Amer 73 (L) >90 mL/min    Comment: (NOTE) The eGFR has been calculated using the CKD EPI equation. This calculation has not been validated in all clinical situations. eGFR's persistently <90 mL/min signify possible Chronic Kidney Disease.    Anion gap 6 5 - 15  CBC     Status: Abnormal   Collection Time: 10/06/14  3:53 AM  Result Value Ref Range   WBC 10.0 4.0 - 10.5 K/uL   RBC 3.52 (L) 4.22 - 5.81 MIL/uL   Hemoglobin 10.4 (L) 13.0 - 17.0 g/dL   HCT  32.4 (L) 39.0 - 52.0 %   MCV 92.0 78.0 - 100.0 fL   MCH 29.5 26.0 - 34.0 pg   MCHC 32.1 30.0 - 36.0 g/dL   RDW 15.8 (H) 11.5 - 15.5 %   Platelets 139 (L) 150 - 400 K/uL  Magnesium     Status: None   Collection Time: 10/06/14  3:53 AM  Result Value Ref Range   Magnesium 2.3 1.5 - 2.5 mg/dL  Urinalysis, Routine w reflex microscopic     Status: None   Collection Time: 10/06/14  9:28 AM  Result Value Ref Range   Color, Urine YELLOW YELLOW   APPearance CLEAR CLEAR   Specific Gravity, Urine 1.020 1.005 - 1.030   pH 5.5 5.0 - 8.0   Glucose, UA NEGATIVE NEGATIVE mg/dL   Hgb urine dipstick NEGATIVE NEGATIVE   Bilirubin Urine NEGATIVE NEGATIVE   Ketones, ur NEGATIVE NEGATIVE mg/dL   Protein, ur NEGATIVE NEGATIVE mg/dL   Urobilinogen, UA 1.0 0.0 - 1.0 mg/dL   Nitrite NEGATIVE NEGATIVE   Leukocytes, UA NEGATIVE NEGATIVE    Comment: MICROSCOPIC NOT DONE ON URINES WITH NEGATIVE PROTEIN, BLOOD, LEUKOCYTES, NITRITE, OR GLUCOSE <1000 mg/dL.    No  results found.  Review of Systems  Constitutional: Negative for fever and chills.  Respiratory: Negative for shortness of breath.   Cardiovascular: Negative for chest pain and palpitations.  Neurological: Negative for dizziness and loss of consciousness.  All other systems reviewed and are negative.  Blood pressure 142/90, pulse 78, temperature 98.3 F (36.8 C), temperature source Oral, resp. rate 18, height 5' 6"  (1.676 m), weight 220 lb 1.6 oz (99.837 kg), SpO2 100 %. Physical Exam  Constitutional: He is oriented to person, place, and time. He appears well-developed and well-nourished. No distress.  Neck: No JVD present. Carotid bruit is not present.  Cardiovascular: Normal rate, regular rhythm, normal heart sounds and intact distal pulses.  Exam reveals no gallop and no friction rub.   No murmur heard. Respiratory: Effort normal and breath sounds normal. No respiratory distress. He has no wheezes. He has no rales.  GI: Soft. Bowel sounds are  normal. He exhibits no distension and no mass.  Musculoskeletal: He exhibits no edema.  Neurological: He is alert and oriented to person, place, and time.  Skin: Skin is warm and dry. He is not diaphoretic.  Psychiatric: He has a normal mood and affect. His behavior is normal.   ECG - atrial fib with a controlled VR. QTC - 450-500 Assessment/Plan: Principal Problem:   Atrial fibrillation with rapid ventricular response Active Problems:   Sinus bradycardia   Acute on chronic systolic CHF (congestive heart failure)   Hypotension   Nonischemic cardiomyopathy   Hyperglycemia   Troponin level elevated   Sleep-disordered breathing   Obesity   Anemia, mild   Thrombocytopenia  1. PAF: currently in NSR after successful DCCV 10/04/14. Dr. Lovena Le to review records and assess later today to help determine if he is a suitable candidate for Tikosyn, in an effort to maintain SR. Estimated Creatinine Clearance: 64.4 mL/min (by C-G formula based on Cr of 1.13). K is >4.0 at 4.2. Mg is pending. Mg will need to be >/= 1.8. If decision is made to start Tikosyn tonight, will order EKG to calculate QTc.    SIMMONS, BRITTAINY 10/06/2014, 11:02 AM   EP Attending  Patient seen and examined. I have reviewed the findings as noted by Lyda Jester PA-C and agree with her documentation. The patient has had recurrent symptomatic atrial fibrillation. He is limited in choices of anti-arrhythmic drug therapy. His QT interval is too long for Tikosyn. He is concerned about the expense of Multaq and might not be a good choice in the setting of LV dysfunction. Amiodarone would be the best option for maintenance of NSR. I have discussed these options with the patient and he willing to proceed.  Mikle Bosworth.D.

## 2014-10-06 NOTE — Progress Notes (Signed)
Patient: Chad Brown / Admit Date: 10/02/2014 / Date of Encounter: 10/06/2014, 7:46 AM   Subjective: Last night after supper he had a period of 30 mins of dyspnea followed by orthopnea. Received Xanax and O2. Never did lay back flat last night.   Objective: Telemetry: NSR since DCCV with intermittent breakthru tachyarrhythmia - 15 beats VT at 1406, irregular narrow-complex rhythm at 1525 (?AF), atrial-tach appearing rhythm at 2100, 2020, 0006 - all very brief Physical Exam: Blood pressure 130/88, pulse 68, temperature 98.3 F (36.8 C), temperature source Oral, resp. rate 18, height 5\' 6"  (1.676 m), weight 220 lb 1.6 oz (99.837 kg), SpO2 100 %. General: Well developed, well nourished WM in no acute distress. Head: Normocephalic, atraumatic, sclera non-icteric, no xanthomas, nares are without discharge. Neck: JVP not elevated. Lungs: Diminished BS at bases. Otherwise moderate air movement without wheezes, rales, or rhonchi. Breathing is unlabored. Heart: RRR S1 S2 without murmurs, rubs, or gallops.  Abdomen: Soft, non-tender, non-distended with normoactive bowel sounds. No rebound/guarding. Extremities: No clubbing or cyanosis. No edema. Distal pedal pulses are 2+ and equal bilaterally. Neuro: Alert and oriented X 3. Moves all extremities spontaneously. Psych:  Responds to questions appropriately with a normal affect.   Intake/Output Summary (Last 24 hours) at 10/06/14 0746 Last data filed at 10/06/14 0500  Gross per 24 hour  Intake 1192.5 ml  Output    550 ml  Net  642.5 ml    Inpatient Medications:  . carvedilol  6.25 mg Oral BID WC  . dorzolamide-timolol  1 drop Right Eye BID  . finasteride  5 mg Oral Daily  . gabapentin  200 mg Oral q morning - 10a  . gabapentin  300 mg Oral QHS  . pravastatin  80 mg Oral q1800  . rivaroxaban  20 mg Oral Q supper  . sodium chloride  3 mL Intravenous Q12H  . tamsulosin  0.4 mg Oral Daily   Infusions:    Labs:  Recent Labs   10/05/14 0345 10/06/14 0353  NA 141 141  K 4.1 4.2  CL 106 108  CO2 27 27  GLUCOSE 113* 114*  BUN 16 16  CREATININE 1.02 1.13  CALCIUM 8.0* 8.2*   No results for input(s): AST, ALT, ALKPHOS, BILITOT, PROT, ALBUMIN in the last 72 hours.  Recent Labs  10/05/14 0345 10/06/14 0353  WBC 9.5 10.0  HGB 10.5* 10.4*  HCT 32.7* 32.4*  MCV 90.1 92.0  PLT 142* 139*   No results for input(s): CKTOTAL, CKMB, TROPONINI in the last 72 hours. Invalid input(s): POCBNP No results for input(s): HGBA1C in the last 72 hours.   Radiology/Studies:  Dg Chest Port 1 View  10/02/2014   CLINICAL DATA:  Pt c/o shortness of breath x 3 days and 17 lb weight gain since 12/17. EKG done in triage shows pt to be in afib with RVR. Hx: HTN, MI, CHF  EXAM: PORTABLE CHEST - 1 VIEW  COMPARISON:  06/05/2013  FINDINGS: Stable cardiomegaly. Mild bibasilar interstitial opacities in both lung bases. No confluent airspace consolidation. No effusion. Degenerative changes in bilateral shoulders  IMPRESSION: 1. Stable cardiomegaly with mild bibasilar interstitial opacities, possibly subsegmental atelectasis versus early infiltrate.   Electronically Signed   By: Arne Cleveland M.D.   On: 10/02/2014 10:51     Assessment and Plan  1. Paroxysmal atrial fib with RVR - first documented recurrence since 12/2012. S/p TEE/DCCV 10/05/14. Will discuss breakthrough tachy on tele with MD. It has been felt that if  he does not hold sinus he may require Tikosyn since rate control limited by hypotension this admission. However, QTc on sinus EKG was 462ms but he does have a NSIVCD at 11ms thus would need EP input. 2. NSVT - add Mg. Continue BB.  3. Acute on chronic systolic CHF felt 2/2 NICM (dx 12/2012, nonischemic nuc 03/2013) - he reports symptoms of recurrent dyspnea and orthopnea overnight. His EF was still 30% by TEE this admission. Weight is back up to 220. Would consider placing back on IV Lasix temporarily. ARB also on hold 2/2 recent  hypotension but BP has improved. Dr. Stanford Breed recommends to repeat echocardiogram 3 months after sinus reestablished and full titration of medications. If EF remains <35%, would need cardiac catheterization and possible referral for ICD. 4. Elevated troponin felt due to demand ischemia - he denies anginal symptoms. In setting of #2, #3 will clarify plan with Dr. Stanford Breed. 5. Mild anemia/thrombocytopenia - denies bleeding. Will need to f/u PCP for this. Hemoccults ordered. UA to r/o microscopic hematuria. 6. Hyperglycemia A1C 6.5 - will need to f/u PCP for probable newly diagnosed DM. 7. Sleep disordered breathing/obesity - Body mass index is 34.71 kg/(m^2).- will need sleep study as outpatient.  Signed, Melina Copa PA-C  As above, patient seen and examined. Patient had dyspnea and orthopnea last evening. No chest pain. He remains in sinus rhythm status post TEE guided cardioversion. Continue carvedilol but increased to 12.5 mg twice a day. Continue xarelto. Will ask EP to review for choice of antiarrhythmic; would favor tikosyn. We will resume ARB (Cozaar 50 mg daily). Diurese with Lasix 40 mg IV daily. Follow renal function. Following discharge would titrate medications with repeat echocardiogram to assess LV function in 3 months. If ejection fraction less than 35% would need to consider cardiac catheterization to exclude coronary disease followed by referral for ICD. I do not think minimal elevation in his troponin is significant. Kirk Ruths

## 2014-10-07 LAB — BASIC METABOLIC PANEL
ANION GAP: 5 (ref 5–15)
BUN: 14 mg/dL (ref 6–23)
CHLORIDE: 107 meq/L (ref 96–112)
CO2: 29 mmol/L (ref 19–32)
CREATININE: 1.03 mg/dL (ref 0.50–1.35)
Calcium: 8 mg/dL — ABNORMAL LOW (ref 8.4–10.5)
GFR calc non Af Amer: 70 mL/min — ABNORMAL LOW (ref >90)
GFR, EST AFRICAN AMERICAN: 81 mL/min — AB (ref >90)
Glucose, Bld: 93 mg/dL (ref 70–99)
POTASSIUM: 3.4 mmol/L — AB (ref 3.5–5.1)
SODIUM: 141 mmol/L (ref 135–145)

## 2014-10-07 LAB — CBC
HEMATOCRIT: 31.2 % — AB (ref 39.0–52.0)
Hemoglobin: 10.1 g/dL — ABNORMAL LOW (ref 13.0–17.0)
MCH: 28.9 pg (ref 26.0–34.0)
MCHC: 32.4 g/dL (ref 30.0–36.0)
MCV: 89.4 fL (ref 78.0–100.0)
PLATELETS: 126 10*3/uL — AB (ref 150–400)
RBC: 3.49 MIL/uL — AB (ref 4.22–5.81)
RDW: 15.4 % (ref 11.5–15.5)
WBC: 7.4 10*3/uL (ref 4.0–10.5)

## 2014-10-07 LAB — MAGNESIUM: MAGNESIUM: 2.2 mg/dL (ref 1.5–2.5)

## 2014-10-07 MED ORDER — POTASSIUM CHLORIDE CRYS ER 20 MEQ PO TBCR
40.0000 meq | EXTENDED_RELEASE_TABLET | Freq: Once | ORAL | Status: AC
  Administered 2014-10-07: 40 meq via ORAL
  Filled 2014-10-07: qty 2

## 2014-10-07 MED ORDER — LOSARTAN POTASSIUM 50 MG PO TABS
100.0000 mg | ORAL_TABLET | Freq: Every day | ORAL | Status: DC
Start: 2014-10-08 — End: 2014-10-08
  Administered 2014-10-08: 100 mg via ORAL
  Filled 2014-10-07: qty 2

## 2014-10-07 NOTE — Progress Notes (Signed)
  Patient: Chad Brown / Admit Date: 10/02/2014 / Date of Encounter: 10/07/2014, 10:24 AM   Subjective: Dyspnea improving; no chest pain  Objective: Telemetry: NSR  Physical Exam: Blood pressure 126/86, pulse 72, temperature 98.6 F (37 C), temperature source Oral, resp. rate 18, height 5\' 6"  (1.676 m), weight 217 lb 1.6 oz (98.476 kg), SpO2 92 %. General: Well developed, well nourished WM in no acute distress. Head: Normal Neck: supple Lungs: CTA Heart: RRR  Abdomen: Soft, non-tender, non-distended Extremities: Trace to 1+ edema.  Neuro: Grossly intact    Intake/Output Summary (Last 24 hours) at 10/07/14 1024 Last data filed at 10/07/14 1003  Gross per 24 hour  Intake    723 ml  Output   2400 ml  Net  -1677 ml    Inpatient Medications:  . amiodarone  400 mg Oral Daily  . carvedilol  12.5 mg Oral BID WC  . dorzolamide-timolol  1 drop Right Eye BID  . finasteride  5 mg Oral Daily  . furosemide  40 mg Intravenous Daily  . gabapentin  200 mg Oral q morning - 10a  . gabapentin  300 mg Oral QHS  . losartan  50 mg Oral Daily  . pravastatin  80 mg Oral q1800  . rivaroxaban  20 mg Oral Q supper  . sodium chloride  3 mL Intravenous Q12H  . tamsulosin  0.4 mg Oral Daily   Infusions:    Labs:  Recent Labs  10/06/14 0353 10/07/14 0500  NA 141 141  K 4.2 3.4*  CL 108 107  CO2 27 29  GLUCOSE 114* 93  BUN 16 14  CREATININE 1.13 1.03  CALCIUM 8.2* 8.0*  MG 2.3 2.2    Recent Labs  10/06/14 0353 10/07/14 0500  WBC 10.0 7.4  HGB 10.4* 10.1*  HCT 32.4* 31.2*  MCV 92.0 89.4  PLT 139* 126*     Radiology/Studies:  Dg Chest Port 1 View  10/02/2014   CLINICAL DATA:  Pt c/o shortness of breath x 3 days and 17 lb weight gain since 12/17. EKG done in triage shows pt to be in afib with RVR. Hx: HTN, MI, CHF  EXAM: PORTABLE CHEST - 1 VIEW  COMPARISON:  06/05/2013  FINDINGS: Stable cardiomegaly. Mild bibasilar interstitial opacities in both lung bases. No confluent  airspace consolidation. No effusion. Degenerative changes in bilateral shoulders  IMPRESSION: 1. Stable cardiomegaly with mild bibasilar interstitial opacities, possibly subsegmental atelectasis versus early infiltrate.   Electronically Signed   By: Arne Cleveland M.D.   On: 10/02/2014 10:51     Assessment and Plan  1. Paroxysmal atrial fib with RVR - first documented recurrence since 12/2012. S/p TEE/DCCV 10/05/14. Remains in sinus; amiodarone initiated; continue coreg and xarelto. 2. NSVT - Continue BB.  3. Acute on chronic systolic CHF felt 2/2 NICM (dx 12/2012, nonischemic nuc 03/2013) - Continue IV lasix today and transition to PO in AM. Continue cozaar but increase to 100 mg daily; continue coreg. Repeat echocardiogram 3 months after sinus reestablished and full titration of medications. If EF remains <35%, would need cardiac catheterization and possible referral for ICD. 4. Elevated troponin felt due to demand ischemia - minimal 5. Sleep disordered breathing/obesity - Body mass index is 34.71 kg/(m^2).- will need sleep study as outpatient. 6. Hypokalemia-supplement Possible DC in AM if stable Signed, Kirk Ruths

## 2014-10-07 NOTE — Discharge Instructions (Addendum)

## 2014-10-08 LAB — BASIC METABOLIC PANEL
ANION GAP: 3 — AB (ref 5–15)
BUN: 14 mg/dL (ref 6–23)
CO2: 32 mmol/L (ref 19–32)
Calcium: 8.2 mg/dL — ABNORMAL LOW (ref 8.4–10.5)
Chloride: 108 mEq/L (ref 96–112)
Creatinine, Ser: 1.07 mg/dL (ref 0.50–1.35)
GFR calc Af Amer: 77 mL/min — ABNORMAL LOW (ref >90)
GFR calc non Af Amer: 67 mL/min — ABNORMAL LOW (ref >90)
GLUCOSE: 100 mg/dL — AB (ref 70–99)
POTASSIUM: 4.1 mmol/L (ref 3.5–5.1)
Sodium: 143 mmol/L (ref 135–145)

## 2014-10-08 LAB — MAGNESIUM: Magnesium: 2.3 mg/dL (ref 1.5–2.5)

## 2014-10-08 MED ORDER — AMIODARONE HCL 400 MG PO TABS: 400.0000 mg | ORAL_TABLET | Freq: Every day | ORAL | Status: DC

## 2014-10-08 MED ORDER — CARVEDILOL 12.5 MG PO TABS: 12.5000 mg | ORAL_TABLET | Freq: Two times a day (BID) | ORAL | Status: DC

## 2014-10-08 NOTE — Progress Notes (Signed)
Subjective: No complaints.  Ready to go home  Objective: Vital signs in last 24 hours: Temp:  [97.6 F (36.4 C)-98.3 F (36.8 C)] 97.6 F (36.4 C) (01/08 0500) Pulse Rate:  [57-72] 57 (01/08 0500) Resp:  [18] 18 (01/08 0500) BP: (116-140)/(59-86) 116/59 mmHg (01/08 0500) SpO2:  [94 %-99 %] 94 % (01/08 0500) Weight:  [215 lb 1.6 oz (97.569 kg)] 215 lb 1.6 oz (97.569 kg) (01/08 0500) Last BM Date: 10/04/14  Intake/Output from previous day: 01/07 0701 - 01/08 0700 In: 723 [P.O.:720; I.V.:3] Out: 1800 [Urine:1800] Intake/Output this shift:    Medications Current Facility-Administered Medications  Medication Dose Route Frequency Provider Last Rate Last Dose  . 0.9 %  sodium chloride infusion  250 mL Intravenous PRN Rhonda G Barrett, PA-C      . acetaminophen (TYLENOL) tablet 650 mg  650 mg Oral Q4H PRN Rhonda G Barrett, PA-C      . ALPRAZolam Duanne Moron) tablet 0.25 mg  0.25 mg Oral BID PRN Evelene Croon Barrett, PA-C   0.25 mg at 10/05/14 2104  . amiodarone (PACERONE) tablet 400 mg  400 mg Oral Daily Evans Lance, MD   400 mg at 10/07/14 1001  . carvedilol (COREG) tablet 12.5 mg  12.5 mg Oral BID WC Lelon Perla, MD   12.5 mg at 10/07/14 1752  . dorzolamide-timolol (COSOPT) 22.3-6.8 MG/ML ophthalmic solution 1 drop  1 drop Right Eye BID Evelene Croon Barrett, PA-C   1 drop at 10/07/14 2114  . finasteride (PROSCAR) tablet 5 mg  5 mg Oral Daily Rhonda G Barrett, PA-C   5 mg at 10/07/14 1001  . furosemide (LASIX) injection 40 mg  40 mg Intravenous Daily Lelon Perla, MD   40 mg at 10/07/14 1001  . gabapentin (NEURONTIN) capsule 200 mg  200 mg Oral q morning - 10a Lauren Bajbus, RPH   200 mg at 10/07/14 1002  . gabapentin (NEURONTIN) capsule 300 mg  300 mg Oral QHS Lauren Bajbus, RPH   300 mg at 10/07/14 2114  . losartan (COZAAR) tablet 100 mg  100 mg Oral Daily Lelon Perla, MD      . nitroGLYCERIN (NITROSTAT) SL tablet 0.4 mg  0.4 mg Sublingual Q5 Min x 3 PRN Rhonda G Barrett,  PA-C      . ondansetron (ZOFRAN) injection 4 mg  4 mg Intravenous Q6H PRN Rhonda G Barrett, PA-C      . pravastatin (PRAVACHOL) tablet 80 mg  80 mg Oral q1800 Rhonda G Barrett, PA-C   80 mg at 10/07/14 1752  . rivaroxaban (XARELTO) tablet 20 mg  20 mg Oral Q supper Evelene Croon Barrett, PA-C   20 mg at 10/07/14 1752  . sodium chloride 0.9 % injection 3 mL  3 mL Intravenous Q12H Rhonda G Barrett, PA-C   3 mL at 10/07/14 2114  . sodium chloride 0.9 % injection 3 mL  3 mL Intravenous PRN Rhonda G Barrett, PA-C      . tamsulosin (FLOMAX) capsule 0.4 mg  0.4 mg Oral Daily Rhonda G Barrett, PA-C   0.4 mg at 10/07/14 1002  . zolpidem (AMBIEN) tablet 5 mg  5 mg Oral QHS PRN Lonn Georgia, PA-C        PE: General appearance: alert, cooperative and no distress Lungs: clear to auscultation bilaterally Heart: irregularly irregular rhythm and No MM. Rate controlled Extremities: 1+ ankle edema Pulses: 2+ and symmetric Skin: Warm and dry Neurologic: Grossly normal  Lab Results:  Recent Labs  10/06/14 0353 10/07/14 0500  WBC 10.0 7.4  HGB 10.4* 10.1*  HCT 32.4* 31.2*  PLT 139* 126*   BMET  Recent Labs  10/06/14 0353 10/07/14 0500 10/08/14 0304  NA 141 141 143  K 4.2 3.4* 4.1  CL 108 107 108  CO2 27 29 32  GLUCOSE 114* 93 100*  BUN 16 14 14   CREATININE 1.13 1.03 1.07  CALCIUM 8.2* 8.0* 8.2*    Assessment/Plan     Atrial fibrillation with rapid ventricular response SP TEE/DCCV.  Remains in sinus rhythm with frequent PACs.  Amio, coreg, xarelto.Seen by EP.  QTc too long for tikosyn.     Sinus bradycardia  No significant bradycardia   Acute on chronic systolic CHF (congestive heart failure) Net fluids: -1.0L/-3.4L.   Lasix at 40mg  IV daily.  SCr stable and WNL.  Will change to PO lasix 40mg  daily.  He was on 20mg  at home.  Daily weight monitoring discussed along with when to call office.    Hypotension  BP stable. Continue current therapy   Hypokalemia  Resolved   Nonischemic  cardiomyopathy  EF 30% by TEE   Hyperglycemia  Mild.  A1C 6.5   Troponin level elevated  Felt to be demand ischemia.    Sleep-disordered breathing  Sleep study planned   Obesity   Anemia, mild  Stable   Thrombocytopenia  Stable   LOS: 6 days    HAGER, BRYAN PA-C 10/08/2014 7:38 AM  As above, patient seen and examined. He denies dyspnea or chest pain. He remains in sinus rhythm. Will discharge today. Would continue amiodarone 400 mg daily for 2 weeks and then decrease to 200 mg daily. Continue xarelto. Change Lasix to 20 mg daily. Continue remaining cardiac medications. Follow-up with Dr. Johnsie Cancel in 2-4 weeks. Would plan repeat echocardiogram in approximately 3 months. If ejection fraction remains decreased would consider cardiac catheterization and also referral for possible ICD. Greater than 30 minutes PA and physician time. [place]

## 2014-10-08 NOTE — Progress Notes (Signed)
Pt discharged to home per MD order. Pt received and reviewed all discharge instructions and medication information including follow-up appointments and prescription information. Pt verbalized understanding. Pt alert and oriented at discharge with no complaints of pain. Pt IV and telemetry box removed prior to discharge. Pt escorted to private vehicle via wheelchair by nurse tech. Shyvonne Chastang C  

## 2014-10-08 NOTE — Discharge Summary (Signed)
Physician Discharge Summary      Cardiologist:  Johnsie Cancel Patient ID: Chad Brown MRN: [id number] DOB/AGE: 1941/04/22 74 y.o.  Admit date: 10/02/2014 Discharge date: 10/08/2014  Admission Diagnoses:  Atrial fib with RVR, Acute on chronic systolic CHF (congestive heart failure)   Discharge Diagnoses:  Principal Problem:   Atrial fibrillation with rapid ventricular response Active Problems:   Sinus bradycardia   Acute on chronic systolic CHF (congestive heart failure)   Hypotension   Nonischemic cardiomyopathy   Hyperglycemia   Troponin level elevated   Sleep-disordered breathing   Obesity   Anemia, mild   Thrombocytopenia   Discharged Condition: stable  Hospital Course:   Chad Brown is a 74 y.o. male with a history of CAD, PAF, LVD with an EF of 30-35 percent by echocardiogram in [id number], chronic systolic CHF. He is anticoagulated with Xarelto, but had missed several doses recently because of running out of medication, and delays in getting it filled.  In the last 2 weeks, his weight is up 17 pounds. He notes that the weight was steady but he kept increasing. He has not had lower extremity edema but he rarely gets this. He has noticed increasing shortness of breath, dyspnea on exertion as well as orthopnea but denies PND. He also notes increasing abdominal girth.   The symptoms became much worse about 3 days ago. He does not feel like he was drinking an unusual amount of fluids but it gets to dietary indiscretions because of the holidays. Symptoms became worse about 3 days ago. He began getting lightheaded and having orthostatic dizziness. His exercise tolerance greatly decreased. He did not have any syncopal episodes. He has not had any chest pain. He has not had any palpitations, but has never had palpitations, even when he was in rapid atrial fibrillation. He normally checks his blood pressure and heart rate daily, but has not done that in the last week. At baseline, his heart  rate is in the 50s and his blood pressure is well controlled.  The day of admission the shortness of breath and lightheaded feelings became worse, so he came to the emergency room. He was in atrial fibrillation with rapid ventricular response, and his heart rate was greater than 110, even on Cardizem IV at 15 mg per hour.  He was admitted for IV diuresis with net fluid loss of 3.4 L.  He was hypotensive at one point so lasix and losartan were held.  CHADSVASC 4.  Xarelto was continued.  He then underwent TEE/DCCV with restoration to NSR.  EP was consulted and amiodarone was initiated.  His QTc was too long for tikosyn.  EF 30%.  Repeat echo in three months and consider ICD implant if EF not improved above 35%.  Follow up in the office for sleep eval and with PCP for A1C of 6.5.  Lasix resumed at 20mg  daily at DC.  Daily weight monitoring, and when to call the office, was discussed.  We will continue PO amio  At 400mg  daily for two weeks and decrease to 200mg  daily.  The patient was seen by Dr. Stanford Breed who felt he was stable for DC home.     Consults: EP  Significant Diagnostic Studies: Transesophageal Echocardiogram Note  Chad Brown [id number] 11/27/1940  Procedure: Transesophageal Echocardiogram Indications: atrial fib   Procedure Details Consent: Obtained Time Out: Verified patient identification, verified procedure, site/side was marked, verified correct patient position, special equipment/implants available, Radiology Safety Procedures followed, medications/allergies/relevent history reviewed, required imaging  and test results available. Performed  Medications: Fentanyl:  Versed:  Propofol 130 mg total for TEE and cardioversion   Left Ventrical: Mod - severe LV dysfunction. EF 30% Mitral Valve: moderate MR  Aortic Valve: normal valve, trivial AI Tricuspid Valve: moderate TR  Pulmonic Valve: normla  Left Atrium/ Left atrial appendage: clear, no thrombi  Atrial septum:  no ASD or PFO by color doppler   Aorta:  Complications: No apparent complications Patient did tolerate procedure well.  Will proceed with cardioversion   Cardioversion Note  Chad Brown [id number] 1940/11/14  Procedure: DC Cardioversion Indications: atrial fib   Procedure Details Consent: Obtained Time Out: Verified patient identification, verified procedure, site/side was marked, verified correct patient position, special equipment/implants available, Radiology Safety Procedures followed, medications/allergies/relevent history reviewed, required imaging and test results available. Performed  The patient has been on adequate anticoagulation. The patient received IV propofol ( see above) for sedation. Synchronous cardioversion was performed at 120 joules.  The cardioversion was successful  Complications: No apparent complications Patient did tolerate procedure well. Thayer Headings, [name], MD, [facility]   Treatments: See above  Discharge Exam: Blood pressure 143/62, pulse 57, temperature 97.6 F (36.4 C), temperature source Oral, resp. rate 18, height 5\' 6"  (1.676 m), weight 215 lb 1.6 oz (97.569 kg), SpO2 94 %.   Disposition: 01-Home or Self Care      Discharge Instructions    Diet - low sodium heart healthy    Complete by:  As directed      Discharge instructions    Complete by:  As directed   Monitor your weight every morning.  If you gain 3 pounds in 24 hours, or 5 pounds in a week, call the office for instructions.     Increase activity slowly    Complete by:  As directed             Medication List    TAKE these medications        amiodarone 400 MG tablet  Commonly known as:  PACERONE  Take 1 tablet (400 mg total) by mouth daily.     carvedilol 12.5 MG tablet  Commonly known as:  COREG  Take 1 tablet (12.5 mg total) by mouth 2 (two) times daily with a meal.     dorzolamide-timolol 22.3-6.8 MG/ML ophthalmic solution  Commonly known as:  COSOPT    Place 1 drop into the right eye 2 (two) times daily.     finasteride 5 MG tablet  Commonly known as:  PROSCAR  Take 5 mg by mouth as directed.     furosemide 20 MG tablet  Commonly known as:  LASIX  Take 1 tablet by mouth once a day     gabapentin 100 MG capsule  Commonly known as:  NEURONTIN  Take 2 capsules in the morning and 3 capsules at night     LIVALO 4 MG Tabs  Generic drug:  Pitavastatin Calcium  Take 1 tablet by mouth daily.     losartan 100 MG tablet  Commonly known as:  COZAAR  TAKE 1 TABLET DAILY     NIASPAN 500 MG CR tablet  Generic drug:  niacin  Take 1,000 mg by mouth at bedtime.     potassium chloride SA 20 MEQ tablet  Commonly known as:  K-DUR,KLOR-CON  Take 20 mEq by mouth daily.     rivaroxaban 20 MG Tabs tablet  Commonly known as:  XARELTO  Take 1 tablet (20 mg total) by mouth  daily with supper.     tamsulosin 0.4 MG Caps capsule  Commonly known as:  FLOMAX  Take 0.4 mg by mouth daily.       Follow-up Information    Follow up with [facility].   Why:  Registered Nurse   Contact information:   [address] [place] [address] [phone]       Follow up with [name], MD On 10/29/2014.   Specialty:  Cardiology   Why:  9:15 AM   Contact information:   [id number] [place] [address] [phone]      Greater than 30 minutes was spent completing the patient's discharge.   SignedTarri Fuller, [place]  10/08/2014, 9:03 AM

## 2014-10-29 ENCOUNTER — Encounter: Payer: Self-pay | Admitting: Cardiovascular Disease

## 2014-10-29 ENCOUNTER — Ambulatory Visit (INDEPENDENT_AMBULATORY_CARE_PROVIDER_SITE_OTHER): Payer: Medicare Other | Admitting: Cardiovascular Disease

## 2014-10-29 VITALS — BP 138/80 | HR 58 | Ht 66.0 in | Wt 213.0 lb

## 2014-10-29 DIAGNOSIS — E785 Hyperlipidemia, unspecified: Secondary | ICD-10-CM

## 2014-10-29 DIAGNOSIS — I5023 Acute on chronic systolic (congestive) heart failure: Secondary | ICD-10-CM

## 2014-10-29 DIAGNOSIS — Z79899 Other long term (current) drug therapy: Secondary | ICD-10-CM

## 2014-10-29 DIAGNOSIS — I480 Paroxysmal atrial fibrillation: Secondary | ICD-10-CM

## 2014-10-29 LAB — BASIC METABOLIC PANEL
BUN: 13 mg/dL (ref 6–23)
CO2: 28 mEq/L (ref 19–32)
Calcium: 8.6 mg/dL (ref 8.4–10.5)
Chloride: 108 mEq/L (ref 96–112)
Creatinine, Ser: 1.08 mg/dL (ref 0.40–1.50)
GFR: 71.06 mL/min (ref >60.00)
Glucose, Bld: 108 mg/dL — ABNORMAL HIGH (ref 70–99)
Potassium: 4.4 mEq/L (ref 3.5–5.1)
Sodium: 143 mEq/L (ref 135–145)

## 2014-10-29 LAB — HEPATIC FUNCTION PANEL
ALK PHOS: 72 U/L (ref 39–117)
ALT: 20 U/L (ref 0–53)
AST: 17 U/L (ref 0–37)
Albumin: 3.6 g/dL (ref 3.5–5.2)
BILIRUBIN DIRECT: 0.1 mg/dL (ref 0.0–0.3)
Total Bilirubin: 0.4 mg/dL (ref 0.2–1.2)
Total Protein: 6.5 g/dL (ref 6.0–8.3)

## 2014-10-29 LAB — BRAIN NATRIURETIC PEPTIDE: Pro B Natriuretic peptide (BNP): 385 pg/mL — ABNORMAL HIGH (ref 0.0–100.0)

## 2014-10-29 LAB — T4, FREE: Free T4: 0.93 ng/dL (ref 0.60–1.60)

## 2014-10-29 LAB — TSH: TSH: 2.01 u[IU]/mL (ref 0.35–4.50)

## 2014-10-29 NOTE — Assessment & Plan Note (Signed)
Maint NSR continue amiodarone at 400 mg  Labs today.  Samples of xarelto given

## 2014-10-29 NOTE — Patient Instructions (Signed)
Your physician recommends that you schedule a follow-up appointment in:  [place] Your physician recommends that you continue on your current medications as directed. Please refer to the Current Medication list given to you today. Your physician recommends that you return for lab work in:  TODAY BMET  BNP LIVER  TSH  T4

## 2014-10-29 NOTE — Assessment & Plan Note (Signed)
Well controlled.  Continue current medications and low sodium Dash type diet.    

## 2014-10-29 NOTE — Progress Notes (Signed)
Patient ID: Chad Brown, male   DOB: September 19, 1941, 74 y.o.   MRN: [id number] This is a 74 y.o.  white male patient of who was found to be in rapid atrial fibrillation and heart failure 4/14 . He was admitted to the hospital and underwent TEE guided cardioversion. He is diuresed approximately 20 pounds since then and feels so much better. He denies any chest pain, palpitations, dyspnea, dyspnea on exertion, dizziness, or presyncope. 2-D echo in the hospital showed an EF of 30-35% with diffuse hypokinesis and moderately dilated LA and mild MR. He says he has a remote history of an MI.  Reviewed notes from 81 And no CAD identified ? Spasm and started on norvasc Myovue back in 2008 was normal He is unaware of rhythm Howver his dyspnea is much improved  And CHF better. Having some prostate issues Seeing [facility] Had some obstructive uropathy back in 47 and saw Dr Lupita Dawn 03/19/13 reviewed and normal. Not gated so no EF but no evidence of CAD/ Infarct or ischemia  History of melanoma Had CT last week I reviewed no mets ? Pancreatic abnormality that needs f/u  03/09/13 Normal myovue not gated  Spreading melanoma left thigh Seeing Dr Georges Mouse had surgery and lymphangiogram was ok  Admitted 1/8 for CHF and rapid afib.  He was admitted for IV diuresis with net fluid loss of 3.4 L. He was hypotensive at one point so lasix and losartan were held. CHADSVASC 4. Xarelto was continued. He then underwent TEE/DCCV with restoration to NSR. EP was consulted and amiodarone was initiated. His QTc was too long for tikosyn. EF 30%. Repeat echo in three months and consider ICD implant if EF not improved above 35%. Follow up in the office for sleep eval and with PCP for A1C of 6.5.  Doing well since d/c Home weight 209    ROS: Denies fever, malais, weight loss, blurry vision, decreased visual acuity, cough, sputum, SOB, hemoptysis, pleuritic pain, palpitaitons, heartburn, abdominal pain, melena, lower extremity  edema, claudication, or rash.  All other systems reviewed and negative  General: Affect appropriate Healthy:  appears stated age 74: normal Neck supple with no adenopathy JVP normal no bruits no thyromegaly Lungs clear with no wheezing and good diaphragmatic motion Heart:  S1/S2 no murmur, no rub, gallop or click PMI normal Abdomen: benighn, BS positve, no tenderness, no AAA no bruit.  No HSM or HJR Distal pulses intact with no bruits No edema Neuro non-focal Skin incision left thigh with edge dehiscense no infection ? Retained suture No muscular weakness   Current Outpatient Prescriptions  Medication Sig Dispense Refill  . amiodarone (PACERONE) 400 MG tablet Take 1 tablet (400 mg total) by mouth daily. 60 tablet 11  . carvedilol (COREG) 12.5 MG tablet Take 1 tablet (12.5 mg total) by mouth 2 (two) times daily with a meal. 60 tablet 11  . dorzolamide-timolol (COSOPT) 22.3-6.8 MG/ML ophthalmic solution Place 1 drop into the right eye 2 (two) times daily.    . finasteride (PROSCAR) 5 MG tablet Take 5 mg by mouth as directed.    . furosemide (LASIX) 20 MG tablet Take 1 tablet by mouth once a day 90 tablet 2  . gabapentin (NEURONTIN) 100 MG capsule Take 2 capsules in the morning and 3 capsules at night    . losartan (COZAAR) 100 MG tablet TAKE 1 TABLET DAILY 30 tablet 11  . NIASPAN 500 MG CR tablet Take 1,000 mg by mouth at bedtime.     [name]  Pitavastatin Calcium (LIVALO) 4 MG TABS Take 1 tablet by mouth daily.    . potassium chloride SA (K-DUR,KLOR-CON) 20 MEQ tablet Take 20 mEq by mouth daily.    . rivaroxaban (XARELTO) 20 MG TABS tablet Take 1 tablet (20 mg total) by mouth daily with supper. 90 tablet 2  . tamsulosin (FLOMAX) 0.4 MG CAPS Take 0.4 mg by mouth daily.     No current facility-administered medications for this visit.    Allergies  Zithromax  Electrocardiogram:  6/14  SR inferolateral T wave inversions  Today slower rate   55 persistent inferolateral T wave  inversion PAC  Assessment and Plan

## 2014-10-29 NOTE — Assessment & Plan Note (Signed)
Improved continue lasix check labs  MRI in 3 months and consider BiV AICD if EF still less than 35%

## 2014-10-29 NOTE — Assessment & Plan Note (Signed)
Cholesterol is at goal.  Continue current dose of statin and diet Rx.  No myalgias or side effects.  F/U  LFT's in 6 months. No results found for: LDLCALC           

## 2014-11-01 ENCOUNTER — Telehealth: Payer: Self-pay | Admitting: Cardiovascular Disease

## 2014-11-01 NOTE — Telephone Encounter (Signed)
PT AWARE OF LAB RESULTS./CY 

## 2014-11-01 NOTE — Telephone Encounter (Signed)
New Msg ° ° ° ° ° ° ° ° °Pt returning call from today.  ° ° °Please call back. °

## 2014-12-07 ENCOUNTER — Encounter: Payer: Self-pay | Admitting: Internal Medicine

## 2014-12-13 ENCOUNTER — Telehealth: Payer: Self-pay | Admitting: Cardiovascular Disease

## 2014-12-13 DIAGNOSIS — R9430 Abnormal result of cardiovascular function study, unspecified: Secondary | ICD-10-CM

## 2014-12-13 NOTE — Telephone Encounter (Signed)
WILL FORWARD  TO DR Johnsie Cancel FOR REVIEW tPT TO HAVE  COLONOSCOPY  IN MAY  ALSO  NEEDS TO  HOLD  Ronceverte  .Adonis Housekeeper

## 2014-12-13 NOTE — Telephone Encounter (Signed)
Ok for colonoscopy hold xarelto 2 days before  Make sure he gets his cardiac MRI in first part of May

## 2014-12-13 NOTE — Telephone Encounter (Signed)
New message      Request for surgical clearance:  What type of surgery is being performed? colonoscopy 1. When is this surgery scheduled? may  2. Are there any medications that need to be held prior to surgery and how long? xarelto  3. Name of physician performing surgery? Dr Carlean Purl  4. What is your office phone and fax number? [phone]

## 2014-12-13 NOTE — Telephone Encounter (Signed)
PT  AWARE  WILL FORWARD  MESSAGE TO DR  Carlean Purl .Adonis Housekeeper

## 2014-12-15 NOTE — Telephone Encounter (Signed)
Patient coming April 18 for previsit He should be ok for direct with hold of xarelto prer Dr. Johnsie Cancel  Please let me know if any /  We can copy and paste Dr. Johnsie Cancel message into chart please

## 2014-12-16 ENCOUNTER — Encounter: Payer: Self-pay | Admitting: Cardiovascular Disease

## 2014-12-16 ENCOUNTER — Emergency Department (HOSPITAL_COMMUNITY)
Admission: EM | Admit: 2014-12-16 | Discharge: 2014-12-16 | Disposition: A | Discharge status: Home / Self Care | Payer: Medicare Other | Attending: Emergency Medicine | Admitting: Emergency Medicine

## 2014-12-16 ENCOUNTER — Emergency Department (HOSPITAL_COMMUNITY): Payer: Medicare Other

## 2014-12-16 ENCOUNTER — Encounter (HOSPITAL_COMMUNITY): Payer: Self-pay | Admitting: Emergency Medicine

## 2014-12-16 DIAGNOSIS — E785 Hyperlipidemia, unspecified: Secondary | ICD-10-CM | POA: Diagnosis not present

## 2014-12-16 DIAGNOSIS — S0085XA Superficial foreign body of other part of head, initial encounter: Secondary | ICD-10-CM | POA: Diagnosis not present

## 2014-12-16 DIAGNOSIS — Y9389 Activity, other specified: Secondary | ICD-10-CM | POA: Diagnosis not present

## 2014-12-16 DIAGNOSIS — S50312A Abrasion of left elbow, initial encounter: Secondary | ICD-10-CM | POA: Diagnosis not present

## 2014-12-16 DIAGNOSIS — Z87891 Personal history of nicotine dependence: Secondary | ICD-10-CM | POA: Insufficient documentation

## 2014-12-16 DIAGNOSIS — I5020 Unspecified systolic (congestive) heart failure: Secondary | ICD-10-CM | POA: Diagnosis not present

## 2014-12-16 DIAGNOSIS — M899 Disorder of bone, unspecified: Secondary | ICD-10-CM | POA: Insufficient documentation

## 2014-12-16 DIAGNOSIS — Z23 Encounter for immunization: Secondary | ICD-10-CM | POA: Diagnosis not present

## 2014-12-16 DIAGNOSIS — S00432A Contusion of left ear, initial encounter: Secondary | ICD-10-CM | POA: Insufficient documentation

## 2014-12-16 DIAGNOSIS — Z9889 Other specified postprocedural states: Secondary | ICD-10-CM | POA: Insufficient documentation

## 2014-12-16 DIAGNOSIS — R001 Bradycardia, unspecified: Secondary | ICD-10-CM | POA: Insufficient documentation

## 2014-12-16 DIAGNOSIS — Z85840 Personal history of malignant neoplasm of eye: Secondary | ICD-10-CM | POA: Insufficient documentation

## 2014-12-16 DIAGNOSIS — Z7901 Long term (current) use of anticoagulants: Secondary | ICD-10-CM | POA: Diagnosis not present

## 2014-12-16 DIAGNOSIS — Z79899 Other long term (current) drug therapy: Secondary | ICD-10-CM | POA: Insufficient documentation

## 2014-12-16 DIAGNOSIS — I10 Essential (primary) hypertension: Secondary | ICD-10-CM | POA: Insufficient documentation

## 2014-12-16 DIAGNOSIS — T07XXXA Unspecified multiple injuries, initial encounter: Secondary | ICD-10-CM

## 2014-12-16 DIAGNOSIS — Y998 Other external cause status: Secondary | ICD-10-CM | POA: Insufficient documentation

## 2014-12-16 DIAGNOSIS — G8929 Other chronic pain: Secondary | ICD-10-CM | POA: Diagnosis not present

## 2014-12-16 DIAGNOSIS — I252 Old myocardial infarction: Secondary | ICD-10-CM | POA: Insufficient documentation

## 2014-12-16 DIAGNOSIS — Y92410 Unspecified street and highway as the place of occurrence of the external cause: Secondary | ICD-10-CM | POA: Diagnosis not present

## 2014-12-16 DIAGNOSIS — M1990 Unspecified osteoarthritis, unspecified site: Secondary | ICD-10-CM | POA: Insufficient documentation

## 2014-12-16 DIAGNOSIS — S0993XA Unspecified injury of face, initial encounter: Secondary | ICD-10-CM | POA: Diagnosis present

## 2014-12-16 LAB — CBC WITH DIFFERENTIAL/PLATELET
BASOS ABS: 0 10*3/uL (ref 0.0–0.1)
BASOS PCT: 0 % (ref 0–1)
EOS PCT: 1 % (ref 0–5)
Eosinophils Absolute: 0.1 10*3/uL (ref 0.0–0.7)
HEMATOCRIT: 35.8 % — AB (ref 39.0–52.0)
HEMOGLOBIN: 11.4 g/dL — AB (ref 13.0–17.0)
LYMPHS ABS: 1 10*3/uL (ref 0.7–4.0)
LYMPHS PCT: 18 % (ref 12–46)
MCH: 28.1 pg (ref 26.0–34.0)
MCHC: 31.8 g/dL (ref 30.0–36.0)
MCV: 88.2 fL (ref 78.0–100.0)
MONO ABS: 0.5 10*3/uL (ref 0.1–1.0)
Monocytes Relative: 9 % (ref 3–12)
Neutro Abs: 4 10*3/uL (ref 1.7–7.7)
Neutrophils Relative %: 72 % (ref 43–77)
PLATELETS: 130 10*3/uL — AB (ref 150–400)
RBC: 4.06 MIL/uL — ABNORMAL LOW (ref 4.22–5.81)
RDW: 16.1 % — ABNORMAL HIGH (ref 11.5–15.5)
WBC: 5.5 10*3/uL (ref 4.0–10.5)

## 2014-12-16 LAB — PROTIME-INR
INR: 1.69 — ABNORMAL HIGH (ref 0.00–1.49)
PROTHROMBIN TIME: 20.1 s — AB (ref 11.6–15.2)

## 2014-12-16 LAB — BASIC METABOLIC PANEL
Anion gap: 8 (ref 5–15)
BUN: 7 mg/dL (ref 6–23)
CO2: 31 mmol/L (ref 19–32)
Calcium: 8.7 mg/dL (ref 8.4–10.5)
Chloride: 106 mmol/L (ref 96–112)
Creatinine, Ser: 0.95 mg/dL (ref 0.50–1.35)
GFR calc Af Amer: >90 mL/min (ref >90)
GFR calc non Af Amer: 80 mL/min — ABNORMAL LOW (ref >90)
Glucose, Bld: 123 mg/dL — ABNORMAL HIGH (ref 70–99)
POTASSIUM: 4 mmol/L (ref 3.5–5.1)
SODIUM: 145 mmol/L (ref 135–145)

## 2014-12-16 MED ORDER — LIDOCAINE-EPINEPHRINE-TETRACAINE (LET) SOLUTION
3.0000 mL | Freq: Once | NASAL | Status: AC
  Administered 2014-12-16: 3 mL via TOPICAL
  Filled 2014-12-16: qty 3

## 2014-12-16 MED ORDER — BACITRACIN 500 UNIT/GM EX OINT
10.0000 "application " | TOPICAL_OINTMENT | Freq: Two times a day (BID) | CUTANEOUS | Status: DC
  Administered 2014-12-16: 10 via TOPICAL
  Filled 2014-12-16: qty 0.9
  Filled 2014-12-16 (×2): qty 9

## 2014-12-16 MED ORDER — LIDOCAINE-EPINEPHRINE-TETRACAINE (LET) SOLUTION
9.0000 mL | Freq: Once | NASAL | Status: AC
  Administered 2014-12-16: 9 mL via TOPICAL
  Filled 2014-12-16: qty 9

## 2014-12-16 MED ORDER — TETANUS-DIPHTH-ACELL PERTUSSIS 5-2.5-18.5 LF-MCG/0.5 IM SUSP
0.5000 mL | Freq: Once | INTRAMUSCULAR | Status: AC
  Administered 2014-12-16: 0.5 mL via INTRAMUSCULAR
  Filled 2014-12-16: qty 0.5

## 2014-12-16 MED ORDER — CEPHALEXIN 500 MG PO CAPS: 500.0000 mg | ORAL_CAPSULE | Freq: Four times a day (QID) | ORAL | Status: DC

## 2014-12-16 MED ORDER — CEPHALEXIN 250 MG PO CAPS
500.0000 mg | ORAL_CAPSULE | Freq: Once | ORAL | Status: AC
  Administered 2014-12-16: 500 mg via ORAL
  Filled 2014-12-16: qty 2
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
## 2014-12-16 NOTE — Discharge Instructions (Signed)
Wash the affected area with soap and water and apply a thin layer of topical antibiotic ointment. Do this every 12 hours.   Do not use rubbing alcohol or hydrogen peroxide.                        Look for signs of infection: if you see redness, if the area becomes warm, if pain increases sharply, there is discharge (pus), if red streaks appear or you develop fever or vomiting, RETURN immediately to the Emergency Department  for a recheck.   Apply a pressure dressing to the left ear as instructed.  Take your antibiotics as directed and to completion. You should never have any leftover antibiotics! Push fluids and stay well hydrated.     Abrasions An abrasion is a cut or scrape of the skin. Abrasions do not go through all layers of the skin. HOME CARE  If a bandage (dressing) was put on your wound, change it as told by your doctor. If the bandage sticks, soak it off with warm.  Wash the area with water and soap 2 times a day. Rinse off the soap. Pat the area dry with a clean towel.  Put on medicated cream (ointment) as told by your doctor.  Change your bandage right away if it gets wet or dirty.  Only take medicine as told by your doctor.  See your doctor within 24-48 hours to get your wound checked.  Check your wound for redness, puffiness (swelling), or yellowish-white fluid (pus). GET HELP RIGHT AWAY IF:   You have more pain in the wound.  You have redness, swelling, or tenderness around the wound.  You have pus coming from the wound.  You have a fever or lasting symptoms for more than 2-3 days.  You have a fever and your symptoms suddenly get worse.  You have a bad smell coming from the wound or bandage. MAKE SURE YOU:   Understand these instructions.  Will watch your condition.  Will get help right away if you are not doing well or get worse. Document Released: 03/05/2008 Document Revised: 06/11/2012 Document Reviewed: 08/21/2011 [facility] Patient Information  2015 [place], [place]. This information is not intended to replace advice given to you by your health care provider. Make sure you discuss any questions you have with your health care provider.

## 2014-12-20 ENCOUNTER — Other Ambulatory Visit (HOSPITAL_COMMUNITY): Payer: Self-pay | Admitting: Family Medicine

## 2014-12-20 DIAGNOSIS — M489 Spondylopathy, unspecified: Secondary | ICD-10-CM

## 2014-12-20 DIAGNOSIS — K869 Disease of pancreas, unspecified: Secondary | ICD-10-CM

## 2014-12-22 ENCOUNTER — Other Ambulatory Visit (HOSPITAL_COMMUNITY): Payer: Self-pay | Admitting: Family Medicine

## 2014-12-22 ENCOUNTER — Telehealth: Payer: Self-pay | Admitting: Internal Medicine

## 2014-12-22 DIAGNOSIS — C439 Malignant melanoma of skin, unspecified: Secondary | ICD-10-CM

## 2014-12-22 NOTE — Telephone Encounter (Signed)
Patient was scheduled for colonoscopy, but was canceled due to being on Xarelto.  Pt states that Cardiologist, Dr. Johnsie Cancel, states he can only come off for 2 days.  Dr. Johnsie Cancel states that it should be out of his system after 48 hours.  Advise.    [phone]

## 2014-12-22 NOTE — Telephone Encounter (Signed)
Patient will need an office visit prior to procedure

## 2014-12-23 ENCOUNTER — Ambulatory Visit (HOSPITAL_COMMUNITY): Payer: Medicare Other

## 2015-01-03 ENCOUNTER — Encounter (HOSPITAL_COMMUNITY): Payer: Medicare Other

## 2015-01-03 ENCOUNTER — Ambulatory Visit (HOSPITAL_COMMUNITY)
Admission: RE | Admit: 2015-01-03 | Discharge: 2015-01-03 | Disposition: A | Discharge status: Home / Self Care | Payer: Medicare Other | Source: Ambulatory Visit | Attending: Family Medicine | Admitting: Family Medicine

## 2015-01-03 ENCOUNTER — Encounter (HOSPITAL_COMMUNITY)
Admission: RE | Admit: 2015-01-03 | Discharge: 2015-01-03 | Disposition: A | Discharge status: Home / Self Care | Payer: Medicare Other | Source: Ambulatory Visit | Attending: Family Medicine | Admitting: Family Medicine

## 2015-01-03 DIAGNOSIS — K869 Disease of pancreas, unspecified: Secondary | ICD-10-CM | POA: Diagnosis not present

## 2015-01-03 DIAGNOSIS — C439 Malignant melanoma of skin, unspecified: Secondary | ICD-10-CM

## 2015-01-03 DIAGNOSIS — R937 Abnormal findings on diagnostic imaging of other parts of musculoskeletal system: Secondary | ICD-10-CM | POA: Insufficient documentation

## 2015-01-03 MED ORDER — GADOBENATE DIMEGLUMINE 529 MG/ML IV SOLN
20.0000 mL | Freq: Once | INTRAVENOUS | Status: AC
  Administered 2015-01-03: 20 mL via INTRAVENOUS

## 2015-01-03 MED ORDER — TECHNETIUM TC 99M MEDRONATE IV KIT
25.0000 | PACK | Freq: Once | INTRAVENOUS | Status: AC | PRN
  Administered 2015-01-03: 25 via INTRAVENOUS

## 2015-01-05 ENCOUNTER — Encounter: Payer: Self-pay | Admitting: Gastroenterology

## 2015-01-10 ENCOUNTER — Encounter: Payer: Self-pay | Admitting: Nurse Practitioner

## 2015-01-10 ENCOUNTER — Ambulatory Visit (INDEPENDENT_AMBULATORY_CARE_PROVIDER_SITE_OTHER): Payer: Medicare Other | Admitting: Nurse Practitioner

## 2015-01-10 ENCOUNTER — Telehealth: Payer: Self-pay | Admitting: *Deleted

## 2015-01-10 ENCOUNTER — Encounter: Payer: Self-pay | Admitting: Internal Medicine

## 2015-01-10 VITALS — BP 160/82 | HR 60 | Ht 64.75 in | Wt 211.5 lb

## 2015-01-10 DIAGNOSIS — Z7902 Long term (current) use of antithrombotics/antiplatelets: Secondary | ICD-10-CM

## 2015-01-10 DIAGNOSIS — Z5181 Encounter for therapeutic drug level monitoring: Secondary | ICD-10-CM | POA: Insufficient documentation

## 2015-01-10 DIAGNOSIS — Z1211 Encounter for screening for malignant neoplasm of colon: Secondary | ICD-10-CM | POA: Diagnosis not present

## 2015-01-10 MED ORDER — NA SULFATE-K SULFATE-MG SULF 17.5-3.13-1.6 GM/177ML PO SOLN: ORAL | Status: DC

## 2015-01-10 NOTE — Telephone Encounter (Signed)
Ok to hold anticoagulation 2 days before endoscopy

## 2015-01-10 NOTE — Telephone Encounter (Signed)
01/10/2015   RE: JAYDEEN CORCORAN DOB: Aug 20, 1941 MRN: [id number]   Dear Johnsie Cancel,    We have scheduled the above patient for an endoscopic procedure. Our records show that he is on anticoagulation therapy.   Please advise if patient may come off his therapy of Xarelto 2days prior to the procedure, which is scheduled for 02-22-15.  Please fax back/ or route the completed form to [facility] at [phone].   Sincerely,    Gerlean Ren

## 2015-01-10 NOTE — Progress Notes (Signed)
HPI :  Patient is a 74 year old male known to Dr. Carlean Purl. He had a colonoscopy with polypectomy April 2011. Two polyps were removed from the ascending colon but only one was retrieved and it was hyperplastic. Patient is here for recall  surveillance colonoscopy. He has no GI complaints other than intermittent loose stools amounting to about twice a week. No rectal bleeding. Hemoglobin less than one month ago was stable at 11.4. Patient is on Xarelto for atrial fibrillation. He has no chest pains or shortness of breath.  Past Medical History  Diagnosis Date  . Hyperlipidemia   . Hypertension   . Myocardial infarction 1998  . Systolic CHF     a. New dx 12/2012 ?NICM, may be r/t afib. b. Nuc 03/2013 - normal.  . PAF (paroxysmal atrial fibrillation)     a. Dx 12/2012, s/p TEE/DCCV 01/26/13. b. On Xarelto.  . Arthritis     "about all my joints" (01/26/2013)  . Chronic back pain   . Melanoma of eye 2000's    "right", left leg  . Sinus bradycardia 10/02/2014  . Anemia   . Cholelithiasis   . Atherosclerosis   . Colon polyp, hyperplastic     Family History  Problem Relation Age of Onset  . Kidney disease Mother   . Heart disease Mother     MI, open heart  . Diabetes Mother     dialysis  . Leukemia Father   . Colon cancer Paternal Uncle   . Lung cancer Paternal Uncle     x 2  . Prostate cancer Paternal Uncle   . Diabetes Maternal Grandmother   . Heart attack Maternal Uncle   . Diabetes Maternal Aunt     x 3  . Diabetes Maternal Uncle    History  Substance Use Topics  . Smoking status: Former Smoker -- 3.00 packs/day for 48 years    Types: Cigarettes    Quit date: 09/28/2000  . Smokeless tobacco: Never Used  . Alcohol Use: No   Current Outpatient Prescriptions  Medication Sig Dispense Refill  . amiodarone (PACERONE) 400 MG tablet Take 1 tablet (400 mg total) by mouth daily. 60 tablet 11  . carvedilol (COREG) 12.5 MG tablet Take 1 tablet (12.5 mg total) by mouth 2 (two)  times daily with a meal. 60 tablet 11  . dorzolamide-timolol (COSOPT) 22.3-6.8 MG/ML ophthalmic solution Place 1 drop into the right eye 2 (two) times daily.    . finasteride (PROSCAR) 5 MG tablet Take 5 mg by mouth as directed.    . furosemide (LASIX) 20 MG tablet Take 1 tablet by mouth once a day 90 tablet 2  . gabapentin (NEURONTIN) 100 MG capsule Take 2 capsules in the morning and 3 capsules at night    . losartan (COZAAR) 100 MG tablet TAKE 1 TABLET DAILY 30 tablet 11  . NIASPAN 500 MG CR tablet Take 1,000 mg by mouth at bedtime.     . Pitavastatin Calcium (LIVALO) 4 MG TABS Take 1 tablet by mouth daily.    . potassium chloride SA (K-DUR,KLOR-CON) 20 MEQ tablet Take 20 mEq by mouth daily.    . rivaroxaban (XARELTO) 20 MG TABS tablet Take 1 tablet (20 mg total) by mouth daily with supper. 90 tablet 2  . tamsulosin (FLOMAX) 0.4 MG CAPS Take 0.4 mg by mouth daily.     No current facility-administered medications for this visit.   Allergies  Allergen Reactions  . Zithromax [Azithromycin] Diarrhea  Review of Systems: Positive for arthritis, back pain, fatigue, heart rhythm changes,and urine leakage. All systems reviewed and negative except where noted in HPI.    Chad Brown  01/04/2015   CLINICAL DATA:  74 year old male with history of small pancreatic head lesion noted on prior CT examination 12/18/2013. Followup examination.  EXAM: MRI ABDOMEN WITHOUT AND WITH Brown  TECHNIQUE: Multiplanar multisequence Chad imaging of the abdomen was performed both before and after the administration of intravenous Brown.  Brown:  60mL MULTIHANCE GADOBENATE DIMEGLUMINE 529 MG/ML IV SOLN  COMPARISON:  CT of the abdomen and pelvis 12/18/2013.  FINDINGS: Lower chest:  Mild cardiomegaly.  Hepatobiliary: Several tiny sub cm T1 hypointense, T2 hyperintense, nonenhancing liver lesions are noted, which demonstrate no enhancement on post gadolinium images, compatible with tiny cysts. MRCP  images demonstrate no intra or extrahepatic biliary ductal dilatation. Common bile duct measures 4 mm in the porta hepatis. 9 mm filling defect in the gallbladder, compatible with a gallstone. No current findings to suggest an acute cholecystitis at this time.  Pancreas: Again noted in the anterior aspect of the head of the pancreas there is a small 15 x 11 mm lesion that is low T1 signal intensity, high T2 signal intensity, and does not enhance, stable in size compared to prior examinations dating back to 06/05/2013, most compatible with a benign lesion such as a small pancreatic pseudocyst. No definite association between this lesion and the pancreatic duct. No pancreatic ductal dilatation. Additional 2 mm lesion and 4 mm lesion with similar imaging characteristics in the proximal body and proximal tail of the pancreas also noted on images 14 and 15 of series 14. No suspicious-appearing pancreatic mass.  Spleen: Unremarkable.  Adrenals/Urinary Tract: Multiple T1 hypointense, T2 hyperintense, nonenhancing lesions in the kidneys bilaterally, compatible with simple cysts, the largest of which extend exophytic lesion off the posterior aspect of the interpolar region of the left kidney measuring up to 5.7 x 5.1 cm.  Stomach/Bowel: Visualized portions are unremarkable.  Vascular/Lymphatic: Atherosclerotic disease in the visualized vasculature, without definite aneurysm. No lymphadenopathy noted in the visualized portions of the abdomen.  Other: Trace volume of perihepatic ascites.  Musculoskeletal: No aggressive appearing lesions noted in the visualized portions of the skeleton.  IMPRESSION: 1. Similar appearance of a small cystic lesion in the head of the pancreas anteriorly which has been stable in size compared to remote prior study from 06/05/2013, considered benign, presumably a small pancreatic pseudocyst. 2. Today's study also demonstrates 2 mm and 4 mm lesions in the proximal body and proximal tail of the  pancreas as well, which also appear to be tiny cystic lesions. These are also strongly favored to be benign, but are unfortunately not visualized on prior CT examinations (likely too small to be discriminated accurately by CT). These could be followed with an additional 1 year MRI of the abdomen with and without IV gadolinium to confirm stability if clinically appropriate. 3. Cholelithiasis without evidence to suggest acute cholecystitis at this time. 4. Multiple simple cysts in the kidneys bilaterally, as above. 5. Atherosclerosis. 6. Mild cardiomegaly.   Electronically Signed   By: Vinnie Langton M.D.   On: 01/04/2015 08:41   Physical Exam: BP 160/82 mmHg  Pulse 60  Ht 5' 4.75" (1.645 m)  Wt 211 lb 8 oz (95.936 kg)  BMI 35.45 kg/m2 Constitutional: Pleasant,well-developed, white male in no acute distress. HEENT: Normocephalic and atraumatic. Conjunctivae are normal. No scleral icterus. Neck supple.  Cardiovascular: Normal rate,  occasional irregular beat   Pulmonary/chest: Effort normal and breath sounds normal. No wheezing, rales or rhonchi. Abdominal: Soft, nondistended, nontender. Bowel sounds active throughout. There are no masses palpable. No hepatomegaly. Extremities: no edema Lymphadenopathy: No cervical adenopathy noted. Neurological: Alert and oriented to person place and time. Skin: Skin is warm and dry. No rashes noted. Psychiatric: Normal mood and affect. Behavior is normal.   ASSESSMENT AND PLAN:  38. 74 year old male for colon cancer screening. He has a remote history of adenomatous colon polyps. Last colonoscopy 2011 at which time a hyperplastic polyp was removed. Another polyp was removed but not retrieved. Patient will be scheduled for colonoscopy, to be done in the hospital given low ejection fraction. The risks, benefits, and alternatives to colonoscopy with possible biopsy and possible polypectomy were discussed with the patient and he consents to proceed.   2. Atrial  fibrillation. Hold Xarelto 2 days before procedure - will instruct when and how to resume after procedure. Will communicate by phone or EMR with patient's prescribing provider to confirm holding Xarelto is reasonable in this case.   3. Small pancreatic lesion, stable in size. MRCP report above.   4. Hx of systolic CHF, last documented EF 2014 was only 30-35%    CC: Chad Housekeeper, MD

## 2015-01-10 NOTE — Patient Instructions (Addendum)
You have been scheduled for a colonoscopy. Please follow written instructions given to you at your visit today.  Please pick up your prep supplies at the pharmacy within the next 1-3 days. If you use inhalers (even only as needed), please bring them with you on the day of your procedure. Your physician has requested that you go to www.startemmi.com and enter the access code given to you at your visit today. This web site gives a general overview about your procedure. However, you should still follow specific instructions given to you by our office regarding your preparation for the procedure.  You may have a light breakfast the morning of prep day (the day before the procedure). You may choose from: eggs, toast, chicken noodle soup, crackers.  You should have your breakfast completed between 8:00 and 9:00 am.  Clear liquids only for the rest of the day on prep day and up until 3 hours before procedure.

## 2015-01-11 ENCOUNTER — Telehealth: Payer: Self-pay | Admitting: *Deleted

## 2015-01-11 ENCOUNTER — Other Ambulatory Visit: Payer: Self-pay | Admitting: *Deleted

## 2015-01-11 DIAGNOSIS — Z1211 Encounter for screening for malignant neoplasm of colon: Secondary | ICD-10-CM

## 2015-01-11 NOTE — Telephone Encounter (Signed)
I spoke to the patient's wife

## 2015-01-11 NOTE — Telephone Encounter (Signed)
Spoke to patient's wife Chad Brown. ( Patient signed DPR for [place] to talk to her).  I advised we had to cancel his colonoscopy for 02-22-15 to the [facility] Endoscopy unit due to his low EF.  We changed it to 02-10-2015 at 11:15 am. [facility] endoscopy  Unit with Dr. Silvano Rusk. I mailed out the new instructions for the patient. He is aware per Dr. Johnsie Cancel to hold his Xarelto 48 hours prior to the procedure date. She thanked me for calling and mailing out the instrucuctions.

## 2015-01-19 NOTE — Progress Notes (Signed)
Agree with Ms. Guenther's assessment and plan. Ludia Gartland E. Asiel Chrostowski, MD, FACG   

## 2015-01-25 ENCOUNTER — Encounter: Payer: Self-pay | Admitting: Cardiovascular Disease

## 2015-01-30 DIAGNOSIS — K635 Polyp of colon: Secondary | ICD-10-CM

## 2015-01-30 HISTORY — DX: Polyp of colon: K63.5

## 2015-01-31 ENCOUNTER — Encounter: Payer: Medicare Other | Admitting: Internal Medicine

## 2015-02-02 ENCOUNTER — Encounter (HOSPITAL_COMMUNITY): Payer: Self-pay | Admitting: *Deleted
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
## 2015-02-07 ENCOUNTER — Encounter: Payer: Self-pay | Admitting: Cardiovascular Disease

## 2015-02-07 ENCOUNTER — Ambulatory Visit (INDEPENDENT_AMBULATORY_CARE_PROVIDER_SITE_OTHER): Payer: Medicare Other | Admitting: Cardiovascular Disease

## 2015-02-07 VITALS — BP 146/84 | HR 50 | Ht 64.75 in | Wt 207.8 lb

## 2015-02-07 DIAGNOSIS — M791 Myalgia, unspecified site: Secondary | ICD-10-CM

## 2015-02-07 DIAGNOSIS — Z79899 Other long term (current) drug therapy: Secondary | ICD-10-CM

## 2015-02-07 DIAGNOSIS — C4490 Unspecified malignant neoplasm of skin, unspecified: Secondary | ICD-10-CM

## 2015-02-07 LAB — BASIC METABOLIC PANEL
BUN: 12 mg/dL (ref 6–23)
CHLORIDE: 105 meq/L (ref 96–112)
CO2: 33 mEq/L — ABNORMAL HIGH (ref 19–32)
Calcium: 8.9 mg/dL (ref 8.4–10.5)
Creatinine, Ser: 0.97 mg/dL (ref 0.40–1.50)
GFR: 80.37 mL/min (ref >60.00)
Glucose, Bld: 106 mg/dL — ABNORMAL HIGH (ref 70–99)
Potassium: 4.1 mEq/L (ref 3.5–5.1)
Sodium: 142 mEq/L (ref 135–145)

## 2015-02-07 LAB — RHEUMATOID FACTOR: Rhuematoid fact SerPl-aCnc: 14 IU/mL (ref <=14)

## 2015-02-07 LAB — SEDIMENTATION RATE: Sed Rate: 81 mm/hr — ABNORMAL HIGH (ref 0–22)

## 2015-02-07 LAB — CK: Total CK: 29 U/L (ref 7–232)

## 2015-02-07 MED ORDER — TRAMADOL HCL 50 MG PO TABS: 50.0000 mg | ORAL_TABLET | Freq: Three times a day (TID) | ORAL | Status: DC

## 2015-02-07 NOTE — Patient Instructions (Addendum)
Medication Instructions:  STOP AMIODARONE  FOR  10MONTH  Labwork: TODAY   BMET , SED  RATE, RF, ANA , AND  CPK  Testing/Procedures: NONE  Follow-Up: Your physician recommends that you schedule a follow-up appointment in: [place] have been referred to [place]   Any Other Special Instructions Will Be Listed Below (If Applicable).

## 2015-02-08 LAB — ANA: Anti Nuclear Antibody(ANA): NEGATIVE

## 2015-02-09 ENCOUNTER — Telehealth: Payer: Self-pay | Admitting: Cardiovascular Disease

## 2015-02-09 NOTE — Telephone Encounter (Signed)
PT AWARE OF LAB RESULTS./CY 

## 2015-02-09 NOTE — Telephone Encounter (Signed)
New message      Returning Christine's call to get lab results

## 2015-02-10 ENCOUNTER — Encounter (HOSPITAL_COMMUNITY)
Admission: RE | Disposition: A | Discharge status: Home / Self Care | Payer: Self-pay | Source: Ambulatory Visit | Attending: Internal Medicine

## 2015-02-10 ENCOUNTER — Ambulatory Visit (HOSPITAL_COMMUNITY): Payer: Medicare Other | Admitting: Anesthesiology

## 2015-02-10 ENCOUNTER — Encounter (HOSPITAL_COMMUNITY): Payer: Self-pay | Admitting: *Deleted

## 2015-02-10 ENCOUNTER — Ambulatory Visit (HOSPITAL_COMMUNITY)
Admission: RE | Admit: 2015-02-10 | Discharge: 2015-02-10 | Disposition: A | Discharge status: Home / Self Care | Payer: Medicare Other | Source: Ambulatory Visit | Attending: Internal Medicine | Admitting: Internal Medicine

## 2015-02-10 DIAGNOSIS — R197 Diarrhea, unspecified: Secondary | ICD-10-CM | POA: Diagnosis present

## 2015-02-10 DIAGNOSIS — D120 Benign neoplasm of cecum: Secondary | ICD-10-CM | POA: Insufficient documentation

## 2015-02-10 DIAGNOSIS — D122 Benign neoplasm of ascending colon: Secondary | ICD-10-CM | POA: Diagnosis not present

## 2015-02-10 DIAGNOSIS — I252 Old myocardial infarction: Secondary | ICD-10-CM | POA: Insufficient documentation

## 2015-02-10 DIAGNOSIS — G8929 Other chronic pain: Secondary | ICD-10-CM | POA: Insufficient documentation

## 2015-02-10 DIAGNOSIS — Z79891 Long term (current) use of opiate analgesic: Secondary | ICD-10-CM | POA: Insufficient documentation

## 2015-02-10 DIAGNOSIS — Z87891 Personal history of nicotine dependence: Secondary | ICD-10-CM | POA: Insufficient documentation

## 2015-02-10 DIAGNOSIS — Z96641 Presence of right artificial hip joint: Secondary | ICD-10-CM | POA: Diagnosis not present

## 2015-02-10 DIAGNOSIS — I5020 Unspecified systolic (congestive) heart failure: Secondary | ICD-10-CM | POA: Diagnosis not present

## 2015-02-10 DIAGNOSIS — R195 Other fecal abnormalities: Secondary | ICD-10-CM

## 2015-02-10 DIAGNOSIS — K5730 Diverticulosis of large intestine without perforation or abscess without bleeding: Secondary | ICD-10-CM | POA: Diagnosis not present

## 2015-02-10 DIAGNOSIS — I10 Essential (primary) hypertension: Secondary | ICD-10-CM | POA: Insufficient documentation

## 2015-02-10 DIAGNOSIS — E785 Hyperlipidemia, unspecified: Secondary | ICD-10-CM | POA: Diagnosis not present

## 2015-02-10 DIAGNOSIS — M549 Dorsalgia, unspecified: Secondary | ICD-10-CM | POA: Diagnosis not present

## 2015-02-10 DIAGNOSIS — M1990 Unspecified osteoarthritis, unspecified site: Secondary | ICD-10-CM | POA: Insufficient documentation

## 2015-02-10 DIAGNOSIS — K862 Cyst of pancreas: Secondary | ICD-10-CM

## 2015-02-10 DIAGNOSIS — Z7901 Long term (current) use of anticoagulants: Secondary | ICD-10-CM | POA: Insufficient documentation

## 2015-02-10 DIAGNOSIS — Z79899 Other long term (current) drug therapy: Secondary | ICD-10-CM | POA: Insufficient documentation

## 2015-02-10 DIAGNOSIS — Z85820 Personal history of malignant melanoma of skin: Secondary | ICD-10-CM | POA: Insufficient documentation

## 2015-02-10 DIAGNOSIS — D123 Benign neoplasm of transverse colon: Secondary | ICD-10-CM | POA: Diagnosis not present

## 2015-02-10 DIAGNOSIS — I480 Paroxysmal atrial fibrillation: Secondary | ICD-10-CM | POA: Diagnosis not present

## 2015-02-10 DIAGNOSIS — Z1211 Encounter for screening for malignant neoplasm of colon: Secondary | ICD-10-CM

## 2015-02-10 DIAGNOSIS — Z86010 Personal history of colonic polyps: Secondary | ICD-10-CM | POA: Insufficient documentation

## 2015-02-10 DIAGNOSIS — D649 Anemia, unspecified: Secondary | ICD-10-CM | POA: Diagnosis not present

## 2015-02-10 DIAGNOSIS — Z09 Encounter for follow-up examination after completed treatment for conditions other than malignant neoplasm: Secondary | ICD-10-CM | POA: Diagnosis present

## 2015-02-10 HISTORY — PX: COLONOSCOPY WITH PROPOFOL: SHX5780

## 2015-02-10 HISTORY — DX: Cardiac arrhythmia, unspecified: I49.9

## 2015-02-10 SURGERY — COLONOSCOPY WITH PROPOFOL
Anesthesia: Monitor Anesthesia Care

## 2015-02-10 MED ORDER — PROPOFOL 10 MG/ML IV BOLUS
INTRAVENOUS | Status: AC
  Filled 2015-02-10: qty 20

## 2015-02-10 MED ORDER — PROPOFOL INFUSION 10 MG/ML OPTIME
INTRAVENOUS | Status: DC | PRN
  Administered 2015-02-10: 120 ug/kg/min via INTRAVENOUS

## 2015-02-10 MED ORDER — HYDROMORPHONE HCL 1 MG/ML IJ SOLN: 0.2500 mg | INTRAMUSCULAR | Status: DC | PRN

## 2015-02-10 MED ORDER — LIDOCAINE HCL (CARDIAC) 20 MG/ML IV SOLN
INTRAVENOUS | Status: DC | PRN
  Administered 2015-02-10: 50 mg via INTRAVENOUS

## 2015-02-10 MED ORDER — PANCRELIPASE (LIP-PROT-AMYL) 36000-114000 UNITS PO CPEP: 36000.0000 [IU] | ORAL_CAPSULE | Freq: Three times a day (TID) | ORAL | Status: DC

## 2015-02-10 MED ORDER — RIVAROXABAN 20 MG PO TABS: 20.0000 mg | ORAL_TABLET | Freq: Every day | ORAL | Status: DC

## 2015-02-10 MED ORDER — PROMETHAZINE HCL 25 MG/ML IJ SOLN: 6.2500 mg | INTRAMUSCULAR | Status: DC | PRN

## 2015-02-10 MED ORDER — SODIUM CHLORIDE 0.9 % IV SOLN: INTRAVENOUS | Status: DC

## 2015-02-10 MED ORDER — LACTATED RINGERS IV SOLN
INTRAVENOUS | Status: DC
  Administered 2015-02-10: 1000 mL via INTRAVENOUS

## 2015-02-10 MED ORDER — LIDOCAINE HCL (CARDIAC) 20 MG/ML IV SOLN
INTRAVENOUS | Status: AC
  Filled 2015-02-10: qty 5

## 2015-02-10 MED ORDER — PROPOFOL 10 MG/ML IV BOLUS
INTRAVENOUS | Status: DC | PRN
  Administered 2015-02-10: 50 mg via INTRAVENOUS

## 2015-02-10 SURGICAL SUPPLY — 21 items

## 2015-02-10 NOTE — Discharge Instructions (Signed)
° °  I found and removed 5 polyps - one was large. All look benign but will be analyzed to be sure.  I placed clips on the sites to reduce risk of bleeding.  Start Xarelto again tomorrow.  I have prescribed a pancreatic enzyme supplement to see if it helps loose stools.  I appreciate the opportunity to care for you. Gatha Mayer, MD, FACG  YOU HAD AN ENDOSCOPIC PROCEDURE TODAY: Refer to the procedure report and other information in the discharge instructions given to you for any specific questions about what was found during the examination. If this information does not answer your questions, please call Dr. Celesta Aver office at [phone] to clarify.   YOU SHOULD EXPECT: Some feelings of bloating in the abdomen. Passage of more gas than usual. Walking can help get rid of the air that was put into your GI tract during the procedure and reduce the bloating. If you had a lower endoscopy (such as a colonoscopy or flexible sigmoidoscopy) you may notice spotting of blood in your stool or on the toilet paper. Some abdominal soreness may be present for a day or two, also.  DIET: Your first meal following the procedure should be a light meal and then it is ok to progress to your normal diet. A half-sandwich or bowl of soup is an example of a good first meal. Heavy or fried foods are harder to digest and may make you feel nauseous or bloated. Drink plenty of fluids but you should avoid alcoholic beverages for 24 hours.   ACTIVITY: Your care partner should take you home directly after the procedure. You should plan to take it easy, moving slowly for the rest of the day. You can resume normal activity the day after the procedure however YOU SHOULD NOT DRIVE, use power tools, machinery or perform tasks that involve climbing or major physical exertion for 24 hours (because of the sedation medicines used during the test).   SYMPTOMS TO REPORT IMMEDIATELY: A gastroenterologist can be reached at any  hour. Please call [phone]  for any of the following symptoms:  Following lower endoscopy (colonoscopy, flexible sigmoidoscopy) Excessive amounts of blood in the stool  Significant tenderness, worsening of abdominal pains  Swelling of the abdomen that is new, acute  Fever of 100 or higher   FOLLOW UP:  If any biopsies were taken you will be contacted by phone or by letter within the next 1-3 weeks. Call [phone]  if you have not heard about the biopsies in 3 weeks.  Please also call with any specific questions about appointments or follow up tests.

## 2015-02-10 NOTE — Transfer of Care (Signed)
Immediate Anesthesia Transfer of Care Note  Patient: Chad [name]  Procedure(s) Performed: Procedure(s): COLONOSCOPY WITH PROPOFOL (N/A)  Patient Location: PACU  Anesthesia Type:MAC  Level of Consciousness: awake, alert  and oriented  Airway & Oxygen Therapy: Patient Spontanous Breathing and Patient connected to face mask oxygen  Post-op Assessment: Report given to RN and Post -op Vital signs reviewed and stable  Post vital signs: Reviewed and stable  Last Vitals:  Filed Vitals:   02/10/15 1230  BP:   Pulse: 48  Temp:   Resp: 10    Complications: No apparent anesthesia complications

## 2015-02-10 NOTE — H&P (Signed)
[facility] History and Physical   Primary Care Physician:  Sherrie Mustache, MD   Reason for Procedure:   hx colon polyps, also new diarrhea  Plan:    Colonoscopy The risks and benefits as well as alternatives of endoscopic procedure(s) have been discussed and reviewed. All questions answered. The patient agrees to proceed.      HPI: Chad [name] is a 74 y.o. male with hx colon polyps here for surveillance colonoscopy. He is also developing increasingly loose stools, especially after eating breakfast.   Past Medical History  Diagnosis Date  . Hyperlipidemia   . Hypertension   . Myocardial infarction 1998  . Systolic CHF     a. New dx 12/2012 ?NICM, may be r/t afib. b. Nuc 03/2013 - normal.  . PAF (paroxysmal atrial fibrillation)     a. Dx 12/2012, s/p TEE/DCCV 01/26/13. b. On Xarelto.  . Arthritis     "about all my joints" (01/26/2013)  . Chronic back pain   . Melanoma of eye 2000's    "right", left leg  . Sinus bradycardia 10/02/2014  . Anemia   . Cholelithiasis   . Atherosclerosis   . Colon polyp, hyperplastic   . Dysrhythmia     history intermittent A. Fib- -cardioverted    Past Surgical History  Procedure Laterality Date  . Back surgery  03/2000    "ground calcium deposits from upper thoracic" (01/26/2013)  . Total hip arthroplasty Right 06/2007  . Cataract extraction Right ~ 2006  . Glaucoma surgery Right ~ 2006    "put 3 stents in to drain fluid" (01/26/2013)  . Refractive surgery Right ~ 2006    "twice; both done at [place]" (01/26/2013)  . Surgery scrotal / testicular Right 1990's  . Incision and drainage abscess posterior cervicalspine  05/2012  . Cardiac catheterization  1998  . Tee without cardioversion N/A 01/26/2013    Procedure: TRANSESOPHAGEAL ECHOCARDIOGRAM (TEE);  Surgeon: Lelon Perla, MD;  Location: [facility];  Service: Cardiovascular;  Laterality: N/A;  [name] anes. /   . Cardioversion N/A 01/26/2013    Procedure:  CARDIOVERSION;  Surgeon: Lelon Perla, MD;  Location: [facility] ENDOSCOPY;  Service: Cardiovascular;  Laterality: N/A;  . Tee without cardioversion N/A 10/05/2014    Procedure: TRANSESOPHAGEAL ECHOCARDIOGRAM (TEE)  with cardioversion;  Surgeon: Thayer Headings, MD;  Location: [facility] ENDOSCOPY;  Service: Cardiovascular;  Laterality: N/A;  12:52 synched cardioversion at 120 joules,...afib to SR...12 lead EKG ordered.Marland KitchenMarland KitchenCardiozem d/c'ed per MD verbal order at [place]  . Melanoma excision Left     leg    Prior to Admission medications   Medication Sig Start Date End Date Taking? Authorizing Provider  acetaminophen (TYLENOL) 500 MG tablet Take 1,000 mg by mouth every 4 (four) hours as needed for moderate pain or headache.   Yes Historical Provider, MD  carvedilol (COREG) 12.5 MG tablet Take 1 tablet (12.5 mg total) by mouth 2 (two) times daily with a meal. 10/08/14  Yes Brett Canales, PA-C  dorzolamide-timolol (COSOPT) 22.3-6.8 MG/ML ophthalmic solution Place 1 drop into the right eye 2 (two) times daily.   Yes Historical Provider, MD  finasteride (PROSCAR) 5 MG tablet Take 5 mg by mouth every morning.  02/13/13  Yes Historical Provider, MD  furosemide (LASIX) 20 MG tablet Take 1 tablet by mouth once a day Patient taking differently: Take 1 tablet by mouth every morning. 07/29/14  Yes Josue Hector, MD  gabapentin (NEURONTIN) 100 MG capsule Take 2 capsules in the morning  and 3 capsules at night 02/20/12  Yes Historical Provider, MD  IRON PO Take 1 tablet by mouth 2 (two) times daily.   Yes Historical Provider, MD  losartan (COZAAR) 100 MG tablet TAKE 1 TABLET DAILY Patient taking differently: TAKE 1 TABLET DAILY NIGHTLY. 07/09/14  Yes Josue Hector, MD  Na Sulfate-K Sulfate-Mg Sulf SOLN Take 1 kit as directed 01/10/15  Yes Willia Craze, NP  NIASPAN 500 MG CR tablet Take 1,000 mg by mouth at bedtime.  05/09/12  Yes Historical Provider, MD  Pitavastatin Calcium (LIVALO) 4 MG TABS Take 1 tablet by mouth at bedtime.   05/28/14  Yes Historical Provider, MD  Polyethyl Glycol-Propyl Glycol (SYSTANE ULTRA OP) Apply 1 drop to eye 2 (two) times daily.   Yes Historical Provider, MD  potassium chloride SA (K-DUR,KLOR-CON) 20 MEQ tablet Take 20 mEq by mouth every morning.  01/27/13  Yes Dayna N Dunn, PA-C  rivaroxaban (XARELTO) 20 MG TABS tablet Take 1 tablet (20 mg total) by mouth daily with supper. 09/08/14  Yes Josue Hector, MD  tamsulosin (FLOMAX) 0.4 MG CAPS Take 0.8 mg by mouth at bedtime.    Yes Historical Provider, MD  traMADol (ULTRAM) 50 MG tablet Take 1 tablet (50 mg total) by mouth every 8 (eight) hours. 02/07/15  Yes Josue Hector, MD    Current Facility-Administered Medications  Medication Dose Route Frequency Provider Last Rate Last Dose  . 0.9 %  sodium chloride infusion   Intravenous Continuous Gatha Mayer, MD      . HYDROmorphone (DILAUDID) injection 0.25-0.5 mg  0.25-0.5 mg Intravenous Q5 min PRN Duane Boston, MD      . lactated ringers infusion   Intravenous Continuous Gatha Mayer, MD      . promethazine [facility]) injection 6.25-12.5 mg  6.25-12.5 mg Intravenous Q15 min PRN Duane Boston, MD        Allergies as of 01/11/2015 - Review Complete 01/10/2015  Allergen Reaction Noted  . Zithromax [azithromycin] Diarrhea 10/02/2014    Family History  Problem Relation Age of Onset  . Kidney disease Mother   . Heart disease Mother     MI, open heart  . Diabetes Mother     dialysis  . Leukemia Father   . Colon cancer Paternal Uncle   . Lung cancer Paternal Uncle     x 2  . Prostate cancer Paternal Uncle   . Diabetes Maternal Grandmother   . Heart attack Maternal Uncle   . Diabetes Maternal Aunt     x 3  . Diabetes Maternal Uncle     History   Social History  . Marital Status: Married    Spouse Name: N/A  . Number of Children: 3  . Years of Education: N/A   Occupational History  . retired    Social History Main Topics  . Smoking status: Former Smoker -- 3.00 packs/day for  48 years    Types: Cigarettes    Quit date: 09/28/2000  . Smokeless tobacco: Never Used  . Alcohol Use: No  . Drug Use: No  . Sexual Activity: Not on file   Other Topics Concern  . Not on file   Social History Narrative    Review of Systems:  All other review of systems negative except as mentioned in the HPI.  Physical Exam: Vital signs in last 24 hours:     General:   Alert,  Well-developed, well-nourished, pleasant and cooperative in NAD Lungs:  Clear throughout to  auscultation.   Heart:  Regular rate and rhythm; no murmurs, clicks, rubs,  or gallops. Abdomen:  Soft, nontender and nondistended. Normal bowel sounds.   Neuro/Psych:  Alert and cooperative. Normal mood and affect. A and O x 3   @Damany Eastman  Simonne Maffucci, MD, [facility] [phone] (pager) 02/10/2015 11:16 AM@

## 2015-02-10 NOTE — Op Note (Signed)
[facility] [place], [address]   COLONOSCOPY PROCEDURE REPORT  PATIENT: Chad [name] Chad [name] [name], Chad [name] Chad [name] [name]  MR#: [id number] BIRTHDATE: April 14, 1941 , 74  yrs. old GENDER: male ENDOSCOPIST: Gatha Mayer, MD, [facility] PROCEDURE DATE:  02/10/2015 PROCEDURE:   Colonoscopy, surveillance , Colonoscopy with snare polypectomy, and Colonoscopy with ablation First Screening Colonoscopy - Avg.  risk and is 50 yrs.  old or older - No.  Prior Negative Screening - Now for repeat screening. N/A  History of Adenoma - Now for follow-up colonoscopy & has been > or = to 3 yrs.  Yes hx of adenoma.  Has been 3 or more years since last colonoscopy.  Polyps removed today = YES ASA CLASS:   Class III INDICATIONS:Surveillance due to prior colonic neoplasia and PH Colon Adenoma. MEDICATIONS: Per Anesthesia and Monitored anesthesia care  DESCRIPTION OF PROCEDURE:   After the risks benefits and alternatives of the procedure were thoroughly explained, informed consent was obtained.  The digital rectal exam revealed no abnormalities of the rectum, revealed the prostate was not enlarged, and revealed no prostatic nodules.   The Pentax Colonoscope [id number]  endoscope was introduced through the anus and advanced to the cecum, which was identified by both the appendix and ileocecal valve. No adverse events experienced.   The quality of the prep was good.  (Suprep was used)  The instrument was then slowly withdrawn as the colon was fully examined.      COLON FINDINGS: 1) Five polyps removed.  cecal polyps x 2 - 25 mm hot snare ablated with APC right colon setting after snare.  3 clips placed - prophylactic given need for Xarelto.  5 mm cold snare - ablated site with APC. Ascending polyp 12 mm hot snare and site clipped x1.  transverse 7 mm cold snare - clipped and 15 mm hot snare site clipped. 2) Sigmoid diverticulosis 3) Otherwise normal - good prep - hx polyps last 2011.  Retroflexed views  revealed no abnormalities. The time to cecum = 5.9 Withdrawal time = 38.8   The scope was withdrawn and the procedure completed. COMPLICATIONS: There were no immediate complications.  ENDOSCOPIC IMPRESSION: 1) Five polyps removed.  cecal polyps x 2 - 25 mm hot snare ablated with APC right colon setting after snare.  3 clips placed - prophylactic given need for Xarelto.  5 mm cold snare - ablated site with APC. Ascending polyp 12 mm hot snare and site clipped x1.  transverse 7 mm cold snare - clipped and 15 mm hot snare site clipped. 2) Sigmoid diverticulosis 3) Otherwise normal - good prep - hx polyps last 2011  RECOMMENDATIONS: 1) Restart Xarelto tomorrow evening 2) Trial of Creon 36 K U w/ meals and snacks for loose stools 3) Will plan office f/u after path review  eSigned:  Gatha Mayer, MD, [facility] 02/10/2015 12:48 PM   cc: The Patient and Barnet Glasgow, MD   PATIENT NAME:  Chad [name], Chad [name] [name] MR#: [id number]

## 2015-02-10 NOTE — Anesthesia Postprocedure Evaluation (Signed)
Anesthesia Post Note  Patient: Chad [name]  Procedure(s) Performed: Procedure(s) (LRB): COLONOSCOPY WITH PROPOFOL (N/A)  Anesthesia type: MAC  Patient location: PACU  Post pain: Pain level controlled  Post assessment: Patient's Cardiovascular Status Stable  Last Vitals:  Filed Vitals:   02/10/15 1250  BP: 151/45  Pulse: 50  Temp:   Resp: 11    Post vital signs: Reviewed and stable  Level of consciousness: sedated  Complications: No apparent anesthesia complications

## 2015-02-10 NOTE — Anesthesia Preprocedure Evaluation (Addendum)
Anesthesia Evaluation  Patient identified by MRN, date of birth, ID band Patient awake    Reviewed: Allergy & Precautions, NPO status , Patient's Chart, lab work & pertinent test results  History of Anesthesia Complications Negative for: history of anesthetic complications  Airway Mallampati: II  TM Distance: >3 FB Neck ROM: Full    Dental  (+) Edentulous Upper, Edentulous Lower, Dental Advisory Given   Pulmonary former smoker,    Pulmonary exam normal       Cardiovascular hypertension, Pt. on home beta blockers and Pt. on medications + Past MI and +CHF Normal cardiovascular exam+ dysrhythmias Atrial Fibrillation  Study Conclusions  - Left ventricle: The cavity size was normal. Systolic function was moderately reduced. No evidence of thrombus. - Aortic valve: There was trivial regurgitation. - Mitral valve: There was moderate regurgitation. - Left atrium: No evidence of thrombus in the atrial cavity or appendage. - Tricuspid valve: There was moderate regurgitation.     Neuro/Psych negative neurological ROS  negative psych ROS   GI/Hepatic Neg liver ROS,   Endo/Other  negative endocrine ROS  Renal/GU negative Renal ROS     Musculoskeletal   Abdominal   Peds  Hematology   Anesthesia Other Findings   Reproductive/Obstetrics                           Anesthesia Physical Anesthesia Plan  ASA: III  Anesthesia Plan: MAC   Post-op Pain Management:    Induction: Intravenous  Airway Management Planned: Simple Face Mask  Additional Equipment:   Intra-op Plan:   Post-operative Plan:   Informed Consent: I have reviewed the patients History and Physical, chart, labs and discussed the procedure including the risks, benefits and alternatives for the proposed anesthesia with the patient or authorized representative who has indicated his/her understanding and acceptance.   Dental  advisory given  Plan Discussed with: CRNA, Anesthesiologist and Surgeon  Anesthesia Plan Comments:        Anesthesia Quick Evaluation

## 2015-02-11 ENCOUNTER — Encounter (HOSPITAL_COMMUNITY): Payer: Self-pay | Admitting: Internal Medicine

## 2015-02-19 NOTE — Progress Notes (Signed)
Quick Note:  Please send colonoscopy and path reports to Dr. Edrick Oh ______

## 2015-02-19 NOTE — Progress Notes (Signed)
Quick Note:  Let him know polyps benign (all tubular adenomas) 1) Ask if Creon helping diarrhea? 2) OV me next available - f/u diarrhea 3) colon recall 1 year re: polyps ______

## 2015-02-21 ENCOUNTER — Ambulatory Visit (HOSPITAL_COMMUNITY)
Admission: RE | Admit: 2015-02-21 | Discharge: 2015-02-21 | Disposition: A | Discharge status: Home / Self Care | Payer: Medicare Other | Source: Ambulatory Visit | Attending: Cardiovascular Disease | Admitting: Cardiovascular Disease

## 2015-02-21 DIAGNOSIS — R9430 Abnormal result of cardiovascular function study, unspecified: Secondary | ICD-10-CM | POA: Diagnosis not present

## 2015-02-21 DIAGNOSIS — I429 Cardiomyopathy, unspecified: Secondary | ICD-10-CM | POA: Diagnosis not present

## 2015-02-21 MED ORDER — GADOBENATE DIMEGLUMINE 529 MG/ML IV SOLN
30.0000 mL | Freq: Once | INTRAVENOUS | Status: AC | PRN
  Administered 2015-02-21: 30 mL via INTRAVENOUS

## 2015-02-22 ENCOUNTER — Encounter: Payer: Medicare Other | Admitting: Internal Medicine

## 2015-03-01 ENCOUNTER — Encounter (HOSPITAL_COMMUNITY): Payer: Medicare Other | Attending: Hematology & Oncology | Admitting: Hematology & Oncology

## 2015-03-01 ENCOUNTER — Encounter (HOSPITAL_COMMUNITY): Payer: Self-pay | Admitting: Hematology & Oncology

## 2015-03-01 VITALS — BP 159/69 | HR 60 | Temp 97.5°F | Resp 20 | Ht 64.0 in | Wt 204.3 lb

## 2015-03-01 DIAGNOSIS — C439 Malignant melanoma of skin, unspecified: Secondary | ICD-10-CM | POA: Insufficient documentation

## 2015-03-01 DIAGNOSIS — D696 Thrombocytopenia, unspecified: Secondary | ICD-10-CM

## 2015-03-01 DIAGNOSIS — R948 Abnormal results of function studies of other organs and systems: Secondary | ICD-10-CM

## 2015-03-01 DIAGNOSIS — K635 Polyp of colon: Secondary | ICD-10-CM

## 2015-03-01 DIAGNOSIS — K5730 Diverticulosis of large intestine without perforation or abscess without bleeding: Secondary | ICD-10-CM

## 2015-03-01 DIAGNOSIS — D649 Anemia, unspecified: Secondary | ICD-10-CM

## 2015-03-01 DIAGNOSIS — C4372 Malignant melanoma of left lower limb, including hip: Secondary | ICD-10-CM | POA: Diagnosis not present

## 2015-03-01 LAB — COMPREHENSIVE METABOLIC PANEL
ALBUMIN: 3.3 g/dL — AB (ref 3.5–5.0)
ALK PHOS: 82 U/L (ref 38–126)
ALT: 21 U/L (ref 17–63)
ANION GAP: 9 (ref 5–15)
AST: 19 U/L (ref 15–41)
BUN: 8 mg/dL (ref 6–20)
CO2: 31 mmol/L (ref 22–32)
CREATININE: 0.84 mg/dL (ref 0.61–1.24)
Calcium: 8.6 mg/dL — ABNORMAL LOW (ref 8.9–10.3)
Chloride: 100 mmol/L — ABNORMAL LOW (ref 101–111)
GFR calc Af Amer: >60 mL/min (ref >60)
GFR calc non Af Amer: >60 mL/min (ref >60)
Glucose, Bld: 139 mg/dL — ABNORMAL HIGH (ref 65–99)
POTASSIUM: 3.3 mmol/L — AB (ref 3.5–5.1)
SODIUM: 140 mmol/L (ref 135–145)
TOTAL PROTEIN: 7.2 g/dL (ref 6.5–8.1)
Total Bilirubin: 0.5 mg/dL (ref 0.3–1.2)

## 2015-03-01 LAB — RETICULOCYTES
RBC.: 3.73 MIL/uL — ABNORMAL LOW (ref 4.22–5.81)
RETIC CT PCT: 2.2 % (ref 0.4–3.1)
Retic Count, Absolute: 82.1 10*3/uL (ref 19.0–186.0)

## 2015-03-01 LAB — CBC WITH DIFFERENTIAL/PLATELET
BASOS ABS: 0 10*3/uL (ref 0.0–0.1)
Basophils Relative: 1 % (ref 0–1)
EOS ABS: 0 10*3/uL (ref 0.0–0.7)
Eosinophils Relative: 1 % (ref 0–5)
HCT: 33.5 % — ABNORMAL LOW (ref 39.0–52.0)
Hemoglobin: 10.3 g/dL — ABNORMAL LOW (ref 13.0–17.0)
Lymphocytes Relative: 23 % (ref 12–46)
Lymphs Abs: 1.2 10*3/uL (ref 0.7–4.0)
MCH: 27.6 pg (ref 26.0–34.0)
MCHC: 30.7 g/dL (ref 30.0–36.0)
MCV: 89.8 fL (ref 78.0–100.0)
MONO ABS: 0.5 10*3/uL (ref 0.1–1.0)
Monocytes Relative: 9 % (ref 3–12)
Neutro Abs: 3.3 10*3/uL (ref 1.7–7.7)
Neutrophils Relative %: 66 % (ref 43–77)
PLATELETS: 201 10*3/uL (ref 150–400)
RBC: 3.73 MIL/uL — ABNORMAL LOW (ref 4.22–5.81)
RDW: 17.6 % — ABNORMAL HIGH (ref 11.5–15.5)
WBC: 5 10*3/uL (ref 4.0–10.5)

## 2015-03-01 LAB — SEDIMENTATION RATE: SED RATE: 80 mm/h — AB (ref 0–16)

## 2015-03-01 LAB — LACTATE DEHYDROGENASE: LDH: 151 U/L (ref 98–192)

## 2015-03-01 LAB — FOLATE: Folate: 8.5 ng/mL (ref >5.9)

## 2015-03-01 NOTE — Progress Notes (Signed)
Chad Brown's reason for visit today is for labs as scheduled per MD orders.  Venipuncture performed with a 23 gauge butterfly needle to R Antecubital.  Chad Brown tolerated procedure well and without incident; questions were answered and patient was discharged.

## 2015-03-01 NOTE — Progress Notes (Signed)
[facility] NOTE  Patient Care Team: Chad Housekeeper, MD as PCP - General (Family Medicine)  [address] / [place] [address]   CHIEF COMPLAINTS/PURPOSE OF CONSULTATION:  T2a N0 Melanoma of the L Thigh s/p WLE and sentinel node with Dr. Freddi Brown Abnormal CT and MRI imaging of the abdomen Anemia  HISTORY OF PRESENTING ILLNESS:  Chad Brown 74 y.o. male is here because of a history of melanoma and abnormal imaging of the abdomen. He follows with Dr. Edrick Brown for his primary medical care.  He is also followed for an iridociliary lesion of the left eye which is longstanding that has been diagnosed as either a nevus vs.a slow growing melanoma.   He was seen by Dr. Freddi Brown at [facility] for a T2a melanoma of the left thigh diagnosed in 06/2014. He underwent a WLE and sentinel node procedure on 06/18/2014 with negative sentinel nodes. He has an established follow-up schedule with Dr. Freddi Brown and Dr. Nevada Brown his local dermatologist.  Because of the findings in his eye he underwent a CT of the abdomen and pelvis with contrast on September 5 of 2014. There was an abnormality noted along the ventral surface of the pancreatic head. Follow-up imaging was recommended. A borderline enlarged right external iliac lymph node was noted at 1.2 cm. Repeat CT imaging was done on 12/18/2013 and was stable.   On 01/03/2015 MRI of the Abdomen was obtained that showed a similar appearance of the small cystic lesion in the head of the pancreas and is considered benign. There were 2 additional small lesions 60mm and 104mm in the proximal body and proximal tail of the pancreas which were also felt to be benign and too small to have been adequately seen on CT. 1 year follow-up was recommended.  Because of his history of melanoma in the abnormal imaging findings he was referred to [place] for additional consultation. In addition the patient notes a history of anemia. Laboratory studies reveal an  anemia with a hemoglobin as low as 10.1, also noted is a mild thrombocytopenia. Folic acid and 123456 were both WNL, ferritin was mildly low at 55 ng/ml.    MEDICAL HISTORY:  Past Medical History  Diagnosis Date  . Hyperlipidemia   . Hypertension   . Myocardial infarction 1998  . Systolic CHF     a. New dx 12/2012 ?NICM, may be r/t afib. b. Nuc 03/2013 - normal.  . PAF (paroxysmal atrial fibrillation)     a. Dx 12/2012, s/p TEE/DCCV 01/26/13. b. On Xarelto.  . Arthritis     "about all my joints" (01/26/2013)  . Chronic back pain   . Melanoma of eye 2000's    "right", left leg  . Sinus bradycardia 10/02/2014  . Anemia   . Cholelithiasis   . Atherosclerosis   . Colon polyp, hyperplastic   . Dysrhythmia     history intermittent A. Fib- -cardioverted  . Skin cancer     SURGICAL HISTORY: Past Surgical History  Procedure Laterality Date  . Back surgery  03/2000    "ground calcium deposits from upper thoracic" (01/26/2013)  . Total hip arthroplasty Right 06/2007  . Cataract extraction Right ~ 2006  . Glaucoma surgery Right ~ 2006    "put 3 stents in to drain fluid" (01/26/2013)  . Refractive surgery Right ~ 2006    "twice; both done at [facility]" (01/26/2013)  . Surgery scrotal / testicular Right 1990's  . Incision and drainage abscess posterior  cervicalspine  05/2012  . Cardiac catheterization  1998  . Tee without cardioversion N/A 01/26/2013    Procedure: TRANSESOPHAGEAL ECHOCARDIOGRAM (TEE);  Surgeon: Chad Perla, MD;  Location: [facility];  Service: Cardiovascular;  Laterality: N/A;  Chad Brown anes. /   . Cardioversion N/A 01/26/2013    Procedure: CARDIOVERSION;  Surgeon: Chad Perla, MD;  Location: [facility] ENDOSCOPY;  Service: Cardiovascular;  Laterality: N/A;  . Tee without cardioversion N/A 10/05/2014    Procedure: TRANSESOPHAGEAL ECHOCARDIOGRAM (TEE)  with cardioversion;  Surgeon: Chad Headings, MD;  Location: [facility] ENDOSCOPY;  Service: Cardiovascular;  Laterality: N/A;  12:52 synched  cardioversion at 120 joules,...afib to SR...12 lead EKG ordered.Marland KitchenMarland KitchenCardiozem d/c'ed per MD verbal order at [facility]  . Melanoma excision Left     leg  . Colonoscopy with propofol N/A 02/10/2015    Procedure: COLONOSCOPY WITH PROPOFOL;  Surgeon: Chad Mayer, MD;  Location: [facility] ENDOSCOPY;  Service: Endoscopy;  Laterality: N/A;    SOCIAL HISTORY: History   Social History  . Marital Status: Married    Spouse Name: N/A  . Number of Children: 3  . Years of Education: N/A   Occupational History  . retired    Social History Main Topics  . Smoking status: Former Smoker -- 3.00 packs/day for 48 years    Types: Cigarettes    Quit date: 09/28/2000  . Smokeless tobacco: Never Used  . Alcohol Use: No  . Drug Use: No  . Sexual Activity: Not on file   Other Topics Concern  . Not on file   Social History Narrative   Married for 38 years Born in [employer]. He was a Scientist, water quality, Therapist, occupational, Radio broadcast assistant. 3 sons and 3 grandchildren  Quit smoking 14 years ago Plays golf.  FAMILY HISTORY: Family History  Problem Relation Age of Onset  . Kidney disease Mother   . Heart disease Mother     MI, open heart  . Diabetes Mother     dialysis  . Leukemia Father   . Colon cancer Paternal Uncle   . Lung cancer Paternal Uncle     x 2  . Prostate cancer Paternal Uncle   . Diabetes Maternal Grandmother   . Heart attack Maternal Uncle   . Diabetes Maternal Aunt     x 3  . Diabetes Maternal Uncle    indicated that his mother is deceased. He indicated that his father is deceased.    Father passed away at 19 of leukemia  Mother passed away at 35 of diabetes, kidney disease and CHF.  1 brother and 2 sisters. Sister had throat cancer. Another sister had skin cancer.  ALLERGIES:  is allergic to zithromax.  MEDICATIONS:  Current Outpatient Prescriptions  Medication Sig Dispense Refill  . acetaminophen (TYLENOL) 500 MG tablet Take 1,000 mg by mouth  every 4 (four) hours as needed for moderate pain or headache.    [name] amiodarone (PACERONE) 200 MG tablet     . carvedilol (COREG) 12.5 MG tablet Take 1 tablet (12.5 mg total) by mouth 2 (two) times daily with a meal. 60 tablet 11  . dorzolamide-timolol (COSOPT) 22.3-6.8 MG/ML ophthalmic solution Place 1 drop into the right eye 2 (two) times daily.    . finasteride (PROSCAR) 5 MG tablet Take 5 mg by mouth every morning.     . furosemide (LASIX) 20 MG tablet Take 1 tablet by mouth once a day (Patient taking differently: Take 1 tablet by mouth  every morning.) 90 tablet 2  . gabapentin (NEURONTIN) 100 MG capsule Take 2 capsules in the morning and 3 capsules at night    . IRON PO Take 1 tablet by mouth 2 (two) times daily.    . lipase/protease/amylase (CREON) 36000 UNITS CPEP capsule Take 1 capsule (36,000 Units total) by mouth 3 (three) times daily before meals. And with snacks 150 capsule 1  . losartan (COZAAR) 100 MG tablet TAKE 1 TABLET DAILY (Patient taking differently: TAKE 1 TABLET DAILY NIGHTLY.) 30 tablet 11  . NIASPAN 500 MG CR tablet Take 1,000 mg by mouth at bedtime.     [name] Glycol-Propyl Glycol (SYSTANE ULTRA OP) Apply 1 drop to eye 2 (two) times daily.    . potassium chloride SA (K-DUR,KLOR-CON) 20 MEQ tablet Take 20 mEq by mouth every morning.     . rivaroxaban (XARELTO) 20 MG TABS tablet Take 1 tablet (20 mg total) by mouth daily with supper. Restart tomorrow 02/11/2015 90 tablet 2  . tamsulosin (FLOMAX) 0.4 MG CAPS Take 0.8 mg by mouth at bedtime.     . traMADol (ULTRAM) 50 MG tablet Take 1 tablet (50 mg total) by mouth every 8 (eight) hours. 90 tablet 1  . pravastatin (PRAVACHOL) 40 MG tablet Take 40 mg by mouth.     No current facility-administered medications for this visit.    Review of Systems  Constitutional: Negative for fever, chills, weight loss and malaise/fatigue.  HENT: Negative for congestion, hearing loss, nosebleeds, sore throat and tinnitus.   Eyes: Negative  for blurred vision, double vision, pain and discharge.  Respiratory: Negative for cough, hemoptysis, sputum production, shortness of breath and wheezing.   Cardiovascular: Negative for chest pain, palpitations, claudication, leg swelling and PND.  Gastrointestinal: Negative for heartburn, nausea, vomiting, abdominal pain, diarrhea, constipation, blood in stool and melena.  Genitourinary: Positive for frequency. Negative for dysuria, urgency and hematuria.  Musculoskeletal: Negative for myalgias, joint pain and falls.  Skin: Negative for itching and rash.  Neurological: Negative for dizziness, tingling, tremors, sensory change, speech change, focal weakness, seizures, loss of consciousness, weakness and headaches.  Endo/Heme/Allergies: Does not bruise/bleed easily.  Psychiatric/Behavioral: Negative for depression, suicidal ideas, memory loss and substance abuse. The patient is not nervous/anxious and does not have insomnia.    14 point ROS was done and is otherwise as detailed above or in HPI   PHYSICAL EXAMINATION: ECOG PERFORMANCE STATUS: 0 - Asymptomatic  Filed Vitals:   03/01/15 1400  BP: 173/75  Pulse: 60  Temp: 97.5 F (36.4 C)  Resp: 20   Filed Weights   03/01/15 1400  Weight: 204 lb 4.8 oz (92.67 kg)     Physical Exam  Constitutional: He is oriented to person, place, and time and well-developed, well-nourished, and in no distress.  HENT:  Head: Normocephalic and atraumatic.  Nose: Nose normal.  Mouth/Throat: Oropharynx is clear and moist. No oropharyngeal exudate.  Eyes: Conjunctivae and EOM are normal. Pupils are equal, round, and reactive to light. Right eye exhibits no discharge. Left eye exhibits no discharge. No scleral icterus.  Lower portion of right iris has abnormal shape and color  Neck: Normal range of motion. Neck supple. No tracheal deviation present. No thyromegaly present.  Cardiovascular: Normal rate, regular rhythm and normal heart sounds.  Exam reveals  no gallop and no friction rub.   No murmur heard. Pulmonary/Chest: Effort normal and breath sounds normal. He has no wheezes. He has no rales.  Abdominal: Soft. Bowel sounds are normal.  He exhibits no distension and no mass. There is no tenderness. There is no rebound and no guarding.  Musculoskeletal: Normal range of motion. He exhibits no edema.  On Inferior aspect of his incision the left thigh there is an area of palpable firmness. (patient notes this is chronic) Remainder of surgical site is without abnormality. No other palpable nodules noted. L groin without palpable abnormality  Lymphadenopathy:    He has no cervical adenopathy.  Neurological: He is alert and oriented to person, place, and time. He has normal reflexes. No cranial nerve deficit. Gait normal. Coordination normal.  Skin: Skin is warm and dry. No rash noted.  Psychiatric: Mood, memory, affect and judgment normal.  Nursing note and vitals reviewed.   LABORATORY DATA:  I have reviewed the data as listed Lab Results  Component Value Date   WBC 5.5 12/16/2014   HGB 11.4* 12/16/2014   HCT 35.8* 12/16/2014   MCV 88.2 12/16/2014   PLT 130* 12/16/2014    RADIOGRAPHIC STUDIES: I have personally reviewed the radiological images as listed and agreed with the findings in the report.   EXAM: MRI ABDOMEN WITHOUT AND WITH CONTRAST  TECHNIQUE: Multiplanar multisequence MR imaging of the abdomen was performed both before and after the administration of intravenous contrast.  CONTRAST: 60mL MULTIHANCE GADOBENATE DIMEGLUMINE 529 MG/ML IV SOLN  COMPARISON: CT of the abdomen and pelvis 12/18/2013.  FINDINGS: Lower chest: Mild cardiomegaly.  Hepatobiliary: Several tiny sub cm T1 hypointense, T2 hyperintense, nonenhancing liver lesions are noted, which demonstrate no enhancement on post gadolinium images, compatible with tiny cysts. MRCP images demonstrate no intra or extrahepatic biliary ductal dilatation. Common  bile duct measures 4 mm in the porta hepatis. 9 mm filling defect in the gallbladder, compatible with a gallstone. No current findings to suggest an acute cholecystitis at this time.  Pancreas: Again noted in the anterior aspect of the head of the pancreas there is a small 15 x 11 mm lesion that is low T1 signal intensity, high T2 signal intensity, and does not enhance, stable in size compared to prior examinations dating back to 06/05/2013, most compatible with a benign lesion such as a small pancreatic pseudocyst. No definite association between this lesion and the pancreatic duct. No pancreatic ductal dilatation. Additional 2 mm lesion and 4 mm lesion with similar imaging characteristics in the proximal body and proximal tail of the pancreas also noted on images 14 and 15 of series 14. No suspicious-appearing pancreatic mass.  Spleen: Unremarkable.  Adrenals/Urinary Tract: Multiple T1 hypointense, T2 hyperintense, nonenhancing lesions in the kidneys bilaterally, compatible with simple cysts, the largest of which extend exophytic lesion off the posterior aspect of the interpolar region of the left kidney measuring up to 5.7 x 5.1 cm.  Stomach/Bowel: Visualized portions are unremarkable.  Vascular/Lymphatic: Atherosclerotic disease in the visualized vasculature, without definite aneurysm. No lymphadenopathy noted in the visualized portions of the abdomen.  Other: Trace volume of perihepatic ascites.  Musculoskeletal: No aggressive appearing lesions noted in the visualized portions of the skeleton.  IMPRESSION: 1. Similar appearance of a small cystic lesion in the head of the pancreas anteriorly which has been stable in size compared to remote prior study from 06/05/2013, considered benign, presumably a small pancreatic pseudocyst. 2. Today's study also demonstrates 2 mm and 4 mm lesions in the proximal body and proximal tail of the pancreas as well, which also appear  to be tiny cystic lesions. These are also strongly favored to be benign, but are unfortunately not  visualized on prior CT examinations (likely too small to be discriminated accurately by CT). These could be followed with an additional 1 year MRI of the abdomen with and without IV gadolinium to confirm stability if clinically appropriate. 3. Cholelithiasis without evidence to suggest acute cholecystitis at this time. 4. Multiple simple cysts in the kidneys bilaterally, as above. 5. Atherosclerosis. 6. Mild cardiomegaly.   Electronically Signed  By: Vinnie Langton M.D.  On: 01/04/2015 08:41  ASSESSMENT & PLAN:  T2a N0 Melanoma of the L Thigh s/p WLE and sentinel node with Dr. Freddi Brown Abnormal CT and MRI imaging of the abdomen Anemia/Thrombocytopenia Colonoscopy 02/10/2015 with 5 polyps and sigmoid diverticulosis  I discussed the CT and MRI findings with the patient in detail. I advised him that I feel the findings are benign. But have recommended a repeat MRI of the abdomen in 6 months. If stable at that point I would follow out to the 3 year mark as recommended initially by radiology. Do not feel that PET imaging is warranted nor necessary, in addition the small areas noted in the pancreas measuring 2 mm and 4 mm would be beneath reasonable detection limits by PET.  We discussed his anemia and he is interested in pursuing additional evaluation. We will start with basic anemia laboratory studies today as detailed below. He is up-to-date on screening colonoscopy. I would like to see him back in 3 weeks to review laboratory results at which time we can make additional recommendations regarding further evaluation of his anemia if needed.  Orders Placed This Encounter  Procedures  . CBC with Differential  . Lactate dehydrogenase  . Reticulocytes  . Ferritin  . Vitamin B12  . Folate  . Iron and TIBC  . Haptoglobin  . Sedimentation rate  . C-reactive protein  . Comprehensive  metabolic panel   All questions were answered. The patient knows to call the clinic with any problems, questions or concerns.  This document serves as a record of services personally performed by Ancil Linsey, MD. It was created on her behalf by Pearlie Oyster, a trained medical scribe. The creation of this record is based on the scribe's personal observations and the provider's statements to them. This document has been checked and approved by the attending provider.    I have reviewed the above documentation for accuracy and completeness, and I agree with the above. This note was electronically signed. Kelby Fam. Penland MD

## 2015-03-01 NOTE — Patient Instructions (Addendum)
[place] at [facility] Discharge Instructions  RECOMMENDATIONS MADE BY THE CONSULTANT AND ANY TEST RESULTS WILL BE SENT TO YOUR REFERRING PHYSICIAN.  Exam completed by Dr Whitney Muse. Lab work today Return in 3 weeks to see the doctor We will get you scheduled for a MRI when you return in 3 weeks. Please call the clinic if you have any questions or concerns   Thank you for choosing [place] at [facility] to provide your oncology and hematology care.  To afford each patient quality time with our provider, please arrive at least 15 minutes before your scheduled appointment time.    You need to re-schedule your appointment should you arrive 10 or more minutes late.  We strive to give you quality time with our providers, and arriving late affects you and other patients whose appointments are after yours.  Also, if you no show three or more times for appointments you may be dismissed from the clinic at the providers discretion.     Again, thank you for choosing [facility].  Our hope is that these requests will decrease the amount of time that you wait before being seen by our physicians.       _____________________________________________________________  Should you have questions after your visit to [facility], please contact our office at [phone] between the hours of 8:30 a.m. and 4:30 p.m.  Voicemails left after 4:30 p.m. will not be returned until the following business day.  For prescription refill requests, have your pharmacy contact our office.

## 2015-03-02 ENCOUNTER — Encounter: Payer: Self-pay | Admitting: Internal Medicine

## 2015-03-02 ENCOUNTER — Encounter: Payer: Self-pay | Admitting: *Deleted

## 2015-03-02 ENCOUNTER — Ambulatory Visit (INDEPENDENT_AMBULATORY_CARE_PROVIDER_SITE_OTHER): Payer: Medicare Other | Admitting: Internal Medicine

## 2015-03-02 VITALS — BP 164/82 | HR 57 | Ht 66.0 in | Wt 203.4 lb

## 2015-03-02 DIAGNOSIS — R0683 Snoring: Secondary | ICD-10-CM | POA: Diagnosis not present

## 2015-03-02 DIAGNOSIS — I426 Alcoholic cardiomyopathy: Secondary | ICD-10-CM

## 2015-03-02 DIAGNOSIS — I4891 Unspecified atrial fibrillation: Secondary | ICD-10-CM | POA: Diagnosis not present

## 2015-03-02 DIAGNOSIS — I429 Cardiomyopathy, unspecified: Secondary | ICD-10-CM

## 2015-03-02 DIAGNOSIS — G4730 Sleep apnea, unspecified: Secondary | ICD-10-CM

## 2015-03-02 DIAGNOSIS — I428 Other cardiomyopathies: Secondary | ICD-10-CM

## 2015-03-02 DIAGNOSIS — G478 Other sleep disorders: Secondary | ICD-10-CM

## 2015-03-02 DIAGNOSIS — I480 Paroxysmal atrial fibrillation: Secondary | ICD-10-CM | POA: Diagnosis not present

## 2015-03-02 LAB — CBC WITH DIFFERENTIAL/PLATELET
Basophils Absolute: 0 10*3/uL (ref 0.0–0.1)
Basophils Relative: 0.3 % (ref 0.0–3.0)
EOS ABS: 0.1 10*3/uL (ref 0.0–0.7)
EOS PCT: 1.1 % (ref 0.0–5.0)
HCT: 32.2 % — ABNORMAL LOW (ref 39.0–52.0)
HEMOGLOBIN: 10.5 g/dL — AB (ref 13.0–17.0)
LYMPHS PCT: 20 % (ref 12.0–46.0)
Lymphs Abs: 1 10*3/uL (ref 0.7–4.0)
MCHC: 32.5 g/dL (ref 30.0–36.0)
MCV: 85 fl (ref 78.0–100.0)
MONO ABS: 0.4 10*3/uL (ref 0.1–1.0)
MONOS PCT: 8.8 % (ref 3.0–12.0)
NEUTROS ABS: 3.3 10*3/uL (ref 1.4–7.7)
Neutrophils Relative %: 69.8 % (ref 43.0–77.0)
Platelets: 179 10*3/uL (ref 150.0–400.0)
RBC: 3.79 Mil/uL — ABNORMAL LOW (ref 4.22–5.81)
RDW: 19.4 % — ABNORMAL HIGH (ref 11.5–15.5)
WBC: 4.8 10*3/uL (ref 4.0–10.5)

## 2015-03-02 LAB — BASIC METABOLIC PANEL
BUN: 10 mg/dL (ref 6–23)
CALCIUM: 8.8 mg/dL (ref 8.4–10.5)
CO2: 34 meq/L — AB (ref 19–32)
Chloride: 103 mEq/L (ref 96–112)
Creatinine, Ser: 1 mg/dL (ref 0.40–1.50)
GFR: 77.59 mL/min (ref >60.00)
GLUCOSE: 136 mg/dL — AB (ref 70–99)
Potassium: 3.5 mEq/L (ref 3.5–5.1)
Sodium: 140 mEq/L (ref 135–145)

## 2015-03-02 LAB — HAPTOGLOBIN: Haptoglobin: 373 mg/dL — ABNORMAL HIGH (ref 34–200)

## 2015-03-02 NOTE — Progress Notes (Signed)
Electrophysiology Office Note   Date:  03/02/2015   ID:  Chad Brown, DOB 06-May-1941, MRN [id number]  PCP:  Sherrie Mustache, MD  Cardiologist:  Dr Johnsie Cancel Primary Electrophysiologist: Thompson Grayer, MD    Chief Complaint  Patient presents with  . Atrial Fibrillation  . CHF     History of Present Illness: Chad Brown is a 74 y.o. male who presents today for electrophysiology evaluation.   He presents for EP consultation regarding risks of sudden death.  He reports having a heart attack in 1998.  He has not required stent or bypass.  He presented with rapid atrial fibrillation and heart failure 4/14 . He was admitted to the hospital and underwent TEE guided cardioversion. He is diuresed approximately 20 pounds since then and feels so much better.  2-D echo in the hospital showed an EF of 30-35% with diffuse hypokinesis and moderately dilated LA and mild MR.  He has fatigue.  He thinks that he is little more by arthritis than his breathing.  He does not think that he could walk a mile.  He hasnt felt like playing golf in "quite a while" due to weakness.  He developed recurrent afib in January of this year for which he again required cardioversion. He is chronically anticoagulated with xarelto. He is also on amiodarone.  He snores and "stops breathing" at time.  He has not yet had sleep study though this has been discussed.   He has a h/o melanoma of the left thigh.  Was seen at [facility] and had surgery-->lymphangiogram was ok  Today, he denies symptoms of palpitations, chest pain,  orthopnea, PND, lower extremity edema, claudication, dizziness, presyncope, syncope, bleeding, or neurologic sequela. The patient is tolerating medications without difficulties and is otherwise without complaint today.    Past Medical History  Diagnosis Date  . Hyperlipidemia   . Hypertension   . Myocardial infarction 1998  . Systolic CHF     a. New dx 12/2012 ?NICM, may be r/t afib. b. Nuc 03/2013 -  normal.  . PAF (paroxysmal atrial fibrillation)     a. Dx 12/2012, s/p TEE/DCCV 01/26/13. b. On Xarelto.  . Arthritis     "about all my joints" (01/26/2013)  . Chronic back pain   . Melanoma of eye 2000's    "right", left leg  . Sinus bradycardia 10/02/2014  . Anemia     iron deficient  . Cholelithiasis   . Atherosclerosis   . Colon polyp, hyperplastic 5/16    removed precancerous lesions  . Skin cancer     prior melanoma resection   Past Surgical History  Procedure Laterality Date  . Back surgery  03/2000    "ground calcium deposits from upper thoracic" (01/26/2013)  . Total hip arthroplasty Right 06/2007  . Cataract extraction Right ~ 2006  . Glaucoma surgery Right ~ 2006    "put 3 stents in to drain fluid" (01/26/2013)  . Refractive surgery Right ~ 2006    "twice; both done at [facility]" (01/26/2013)  . Surgery scrotal / testicular Right 1990's  . Incision and drainage abscess posterior cervicalspine  05/2012  . Cardiac catheterization  1998  . Tee without cardioversion N/A 01/26/2013    Procedure: TRANSESOPHAGEAL ECHOCARDIOGRAM (TEE);  Surgeon: Lelon Perla, MD;  Location: [facility];  Service: Cardiovascular;  Laterality: N/A;  [name] anes. /   . Cardioversion N/A 01/26/2013    Procedure: CARDIOVERSION;  Surgeon: Lelon Perla, MD;  Location: [facility];  Service:  Cardiovascular;  Laterality: N/A;  . Tee without cardioversion N/A 10/05/2014    Procedure: TRANSESOPHAGEAL ECHOCARDIOGRAM (TEE)  with cardioversion;  Surgeon: Thayer Headings, MD;  Location: [facility] ENDOSCOPY;  Service: Cardiovascular;  Laterality: N/A;  12:52 synched cardioversion at 120 joules,...afib to SR...12 lead EKG ordered.Marland KitchenMarland KitchenCardiozem d/c'ed per MD verbal order at [facility]  . Melanoma excision Left     leg  . Colonoscopy with propofol N/A 02/10/2015    Procedure: COLONOSCOPY WITH PROPOFOL;  Surgeon: Gatha Mayer, MD;  Location: [facility] ENDOSCOPY;  Service: Endoscopy;  Laterality: N/A;     Current Outpatient Prescriptions    Medication Sig Dispense Refill  . acetaminophen (TYLENOL) 500 MG tablet Take 1,000 mg by mouth every 4 (four) hours as needed for moderate pain or headache.    [name] amiodarone (PACERONE) 200 MG tablet Take 200 mg by mouth daily.     . carvedilol (COREG) 12.5 MG tablet Take 1 tablet (12.5 mg total) by mouth 2 (two) times daily with a meal. 60 tablet 11  . dorzolamide-timolol (COSOPT) 22.3-6.8 MG/ML ophthalmic solution Place 1 drop into the right eye 2 (two) times daily.    . finasteride (PROSCAR) 5 MG tablet Take 5 mg by mouth every morning.     . furosemide (LASIX) 20 MG tablet Take 1 tablet by mouth once a day (Patient taking differently: Take 1 tablet by mouth every morning.) 90 tablet 2  . gabapentin (NEURONTIN) 100 MG capsule Take 2 capsules by mouth in the morning and 3 capsules by mouth at night    . IRON PO Take 1 tablet by mouth 3 (three) times daily.     . lipase/protease/amylase (CREON) 36000 UNITS CPEP capsule Take 1 capsule (36,000 Units total) by mouth 3 (three) times daily before meals. And with snacks 150 capsule 1  . losartan (COZAAR) 100 MG tablet TAKE 1 TABLET DAILY (Patient taking differently: TAKE 1 TABLE BY MOUTH NIGHTLY.) 30 tablet 11  . NIASPAN 500 MG CR tablet Take 1,000 mg by mouth at bedtime.     [name] Glycol-Propyl Glycol (SYSTANE ULTRA OP) Apply 1 drop to eye 2 (two) times daily.    . potassium chloride SA (K-DUR,KLOR-CON) 20 MEQ tablet Take 20 mEq by mouth every morning.     . rivaroxaban (XARELTO) 20 MG TABS tablet Take 1 tablet (20 mg total) by mouth daily with supper. Restart tomorrow 02/11/2015 90 tablet 2  . tamsulosin (FLOMAX) 0.4 MG CAPS Take 0.8 mg by mouth at bedtime.     . traMADol (ULTRAM) 50 MG tablet Take 1 tablet (50 mg total) by mouth every 8 (eight) hours. 90 tablet 1   No current facility-administered medications for this visit.    Allergies:   Zithromax   Social History:  The patient  reports that he quit smoking about 14 years ago. His  smoking use included Cigarettes. He has a 144 pack-year smoking history. He has never used smokeless tobacco. He reports that he does not drink alcohol or use illicit drugs.   Family History:  The patient's  family history includes Colon cancer in his paternal uncle; Diabetes in his maternal aunt, maternal grandmother, maternal uncle, and mother; Heart attack in his maternal uncle; Heart disease in his mother; Kidney disease in his mother; Leukemia in his father; Lung cancer in his paternal uncle; Prostate cancer in his paternal uncle.    ROS:  Please see the history of present illness.   All other systems are reviewed and negative.  PHYSICAL EXAM: VS:  BP 164/82 mmHg  Pulse 57  Ht 5\' 6"  (1.676 m)  Wt 92.262 kg (203 lb 6.4 oz)  BMI 32.85 kg/m2 , BMI Body mass index is 32.85 kg/(m^2). GEN: Well nourished, well developed, in no acute distress HEENT: normal Neck: no JVD, carotid bruits, or masses Cardiac: RRR; no murmurs, rubs, or gallops,no edema  Respiratory:  clear to auscultation bilaterally, normal work of breathing GI: soft, nontender, nondistended, + BS MS: no deformity or atrophy Skin: warm and dry  Neuro:  Strength and sensation are intact Psych: euthymic mood, full affect  EKG:  EKG is ordered today. The ekg ordered today shows sinus rhythm with LBBB, QRS 134 msec   Recent Labs: 10/02/2014: B Natriuretic Peptide 1005.2* 10/08/2014: Magnesium 2.3 10/29/2014: Pro B Natriuretic peptide (BNP) 385.0*; TSH 2.01 03/01/2015: ALT 21; BUN 8; Creatinine 0.84; Hemoglobin 10.3*; Platelets 201; Potassium 3.3*; Sodium 140    Lipid Panel  No results found for: CHOL, TRIG, HDL, CHOLHDL, VLDL, LDLCALC, LDLDIRECT   Wt Readings from Last 3 Encounters:  03/02/15 92.262 kg (203 lb 6.4 oz)  03/01/15 92.67 kg (204 lb 4.8 oz)  02/10/15 93.895 kg (207 lb)      Other studies Reviewed: Additional studies/ records that were reviewed today include: Dr Mariana Arn notes  Review of the above records  today demonstrates: cardiac MRI reveals EF 34%   ASSESSMENT AND PLAN:  1.  Nonischemic CM/ chronic systolic dysfunction The patient has a nonischemic CM (EF 34%), NYHA Class II/III CHF, and LBBB. At this time, he meets SCD-HeFT criteria for ICD implantation for primary prevention of sudden death.  Given LBBB QRS >150 msec, he may also be a candidate for CRT.  Risks, benefits, alternatives to BiV ICD implantation were discussed in detail with the patient today. The patient  understands that the risks include but are not limited to bleeding, infection, pneumothorax, perforation, tamponade, vascular damage, renal failure, MI, stroke, death, inappropriate shocks, and lead dislodgement and wishes to proceed.  We will therefore schedule device implantation at the next available time.  2. Snoring Sleep study  3. Persistent atrial fibrillation Continue amiodarone chads2vasc score is at least 4.  Continue xarelto. Hold xarelto for 48 hours prior to ICD implant   Current medicines are reviewed at length with the patient today.   The patient does not have concerns regarding his medicines.  The following changes were made today:  none   Signed, Thompson Grayer, MD  03/02/2015 9:23 AM     [facility] [address] [place] [address] [phone] (office) [phone] (fax)

## 2015-03-02 NOTE — Patient Instructions (Addendum)
Medication Instructions:  Your physician recommends that you continue on your current medications as directed. Please refer to the Current Medication list given to you today.   Labwork: Your physician recommends that you return for lab work today: BMP/CBC   Testing/Procedures: Your physician has recommended that you have a defibrillator inserted. An implantable cardioverter defibrillator (ICD) is a small device that is placed in your chest or, in rare cases, your abdomen. This device uses electrical pulses or shocks to help control life-threatening, irregular heartbeats that could lead the heart to suddenly stop beating (sudden cardiac arrest). Leads are attached to the ICD that goes into your heart. This is done in the hospital and usually requires an overnight stay. Please see the instruction sheet given to you today for more information.  Your physician has recommended that you have a sleep study. This test records several body functions during sleep, including: brain activity, eye movement, oxygen and carbon dioxide blood levels, heart rate and rhythm, breathing rate and rhythm, the flow of air through your mouth and nose, snoring, body muscle movements, and chest and belly movement.    Follow-Up:  Your physician recommends that you schedule a follow-up appointment in: 10-14 days from 03/08/15 in device clinic for wound check   Any Other Special Instructions Will Be Listed Below (If Applicable).  Cardioverter Defibrillator Implantation An implantable cardioverter defibrillator (ICD) is a small, lightweight, battery-powered device that is placed (implanted) under the skin in the chest or abdomen. Your caregiver may prescribe an ICD if:  You have had an irregular heart rhythm (arrhythmia) that originated in the lower chambers of the heart (ventricles).  Your heart has been damaged by a disease (such as coronary artery disease) or heart condition (such as a heart attack). An ICD consists  of a battery that lasts several years, a small computer called a pulse generator, and wires called leads that go into the heart. It is used to detect and correct two dangerous arrhythmias: a rapid heart rhythm (tachycardia) and an arrhythmia in which the ventricles contract in an uncoordinated way (fibrillation). When an ICD detects tachycardia, it sends an electrical signal to the heart that restores the heartbeat to normal (cardioversion). This signal is usually painless. If cardioversion does not work or if the ICD detects fibrillation, it delivers a small electrical shock to the heart (defibrillation) to restart the heart. The shock may feel like a strong jolt in the chest.ICDs may be programmed to correct other problems. Sometimes, ICDs are programmed to act as another type of implantable device called a pacemaker. Pacemakers are used to treat a slow heartbeat (bradycardia). LET YOUR CAREGIVER KNOW ABOUT:  Any allergies you have.  All medicines you are taking, including vitamins, herbs, eyedrops, and over-the-counter medicines and creams.  Previous problems you or members of your family have had with the use of anesthetics.  Any blood disorders you have had.  Other health problems you have. RISKS AND COMPLICATIONS Generally, the procedure to implant an ICD is safe. However, as with any surgical procedure, complications can occur. Possible complications associated with implanting an ICD include:  Swelling, bleeding, or bruising at the site where the ICD was implanted.  Infection at the site where the ICD was implanted.  A reaction to medicine used during the procedure.  Nerve, heart, or blood vessel damage.  Blood clots. BEFORE THE PROCEDURE  You may need to have blood tests, heart tests, or a chest X-ray done before the day of the procedure.  Ask  your caregiver about changing or stopping your regular medicines.  Make plans to have someone drive you home. You may need to stay in  the hospital overnight after the procedure.  Stop smoking at least 24 hours before the procedure.  Take a bath or shower the night before the procedure. You may need to scrub your chest or abdomen with a special type of soap.  Do not eat or drink before your procedure for as long as directed by your caregiver. Ask if it is okay to take any needed medicine with a small sip of water. PROCEDURE  The procedure to implant an ICD in your chest or abdomen is usually done at a hospital in a room that has a large X-ray machine called a fluoroscope. The machine will be above you during the procedure. It will help your caregiver see your heart during the procedure. Implanting an ICD usually takes 1-3 hours. Before the procedure:   Small monitors will be put on your body. They will be used to check your heart, blood pressure, and oxygen level.  A needle will be put into a vein in your hand or arm. This is called an intravenous (IV) access tube. Fluids and medicine will flow directly into your body through the IV tube.  Your chest or abdomen will be cleaned with a germ-killing (antiseptic) solution. The area may be shaved.  You may be given medicine to help you relax (sedative).  You will be given a medicine called a local anesthetic. This medicine will make the surgical site numb while the ICD is implanted. You will be sleepy but awake during the procedure. After you are numb the procedure will begin. The caregiver will:  Make a small cut (incision). This will make a pocket deep under your skin that will hold the pulse generator.  Guide the leads through a large blood vessel into your heart and attach them to the heart muscles. Depending on the ICD, the leads may go into one ventricle or they may go to both ventricles and into an upper chamber of the heart (atrium).  Test the ICD.  Close the incision with stitches, glue, or staples. AFTER THE PROCEDURE  You may feel pain. Some pain is normal. It  may last a few days.  You may stay in a recovery area until the local anesthetic has worn off. Your blood pressure and pulse will be checked often. You will be taken to a room where your heart will be monitored.  A chest X-ray will be taken. This is done to check that the cardioverter defibrillator is in the right place.  You may stay in the hospital overnight.  A slight bump may be seen over the skin where the ICD was placed. Sometimes, it is possible to feel the ICD under the skin. This is normal.  In the months and years afterward, your caregiver will check the device, the leads, and the battery every few months. Eventually, when the battery is low, the ICD will be replaced. Document Released: 06/09/2002 Document Revised: 07/08/2013 Document Reviewed: 10/06/2012 [facility] Patient Information 2015 [place], [place]. This information is not intended to replace advice given to you by your health care provider. Make sure you discuss any questions you have with your health care provider.

## 2015-03-03 LAB — IRON AND TIBC
Iron: 21 ug/dL — ABNORMAL LOW (ref 45–182)
Saturation Ratios: 7 % — ABNORMAL LOW (ref 17.9–39.5)
TIBC: 304 ug/dL (ref 250–450)
UIBC: 283 ug/dL

## 2015-03-03 LAB — VITAMIN B12: VITAMIN B 12: 519 pg/mL (ref 180–914)

## 2015-03-03 LAB — C-REACTIVE PROTEIN: CRP: 4.7 mg/dL — ABNORMAL HIGH (ref <1.0)

## 2015-03-03 LAB — FERRITIN: Ferritin: 55 ng/mL (ref 24–336)

## 2015-03-08 ENCOUNTER — Encounter (HOSPITAL_COMMUNITY): Payer: Self-pay | Admitting: Hematology & Oncology

## 2015-03-08 ENCOUNTER — Encounter (HOSPITAL_COMMUNITY)
Admission: RE | Disposition: A | Discharge status: Home / Self Care | Payer: Self-pay | Source: Ambulatory Visit | Attending: Internal Medicine

## 2015-03-08 ENCOUNTER — Ambulatory Visit (HOSPITAL_COMMUNITY)
Admission: RE | Admit: 2015-03-08 | Discharge: 2015-03-09 | Disposition: A | Discharge status: Home / Self Care | Payer: Medicare Other | Source: Ambulatory Visit | Attending: Internal Medicine | Admitting: Internal Medicine

## 2015-03-08 ENCOUNTER — Encounter (HOSPITAL_COMMUNITY): Payer: Self-pay | Admitting: General Practice

## 2015-03-08 DIAGNOSIS — I10 Essential (primary) hypertension: Secondary | ICD-10-CM | POA: Diagnosis not present

## 2015-03-08 DIAGNOSIS — I252 Old myocardial infarction: Secondary | ICD-10-CM | POA: Diagnosis not present

## 2015-03-08 DIAGNOSIS — I5022 Chronic systolic (congestive) heart failure: Secondary | ICD-10-CM | POA: Diagnosis present

## 2015-03-08 DIAGNOSIS — I428 Other cardiomyopathies: Secondary | ICD-10-CM

## 2015-03-08 DIAGNOSIS — I447 Left bundle-branch block, unspecified: Secondary | ICD-10-CM | POA: Insufficient documentation

## 2015-03-08 DIAGNOSIS — I429 Cardiomyopathy, unspecified: Secondary | ICD-10-CM

## 2015-03-08 DIAGNOSIS — Z96641 Presence of right artificial hip joint: Secondary | ICD-10-CM | POA: Insufficient documentation

## 2015-03-08 DIAGNOSIS — Z7901 Long term (current) use of anticoagulants: Secondary | ICD-10-CM | POA: Insufficient documentation

## 2015-03-08 DIAGNOSIS — I481 Persistent atrial fibrillation: Secondary | ICD-10-CM | POA: Diagnosis not present

## 2015-03-08 DIAGNOSIS — Z87891 Personal history of nicotine dependence: Secondary | ICD-10-CM | POA: Diagnosis not present

## 2015-03-08 DIAGNOSIS — R001 Bradycardia, unspecified: Secondary | ICD-10-CM | POA: Insufficient documentation

## 2015-03-08 DIAGNOSIS — Z85820 Personal history of malignant melanoma of skin: Secondary | ICD-10-CM | POA: Insufficient documentation

## 2015-03-08 DIAGNOSIS — E785 Hyperlipidemia, unspecified: Secondary | ICD-10-CM | POA: Insufficient documentation

## 2015-03-08 DIAGNOSIS — Z959 Presence of cardiac and vascular implant and graft, unspecified: Secondary | ICD-10-CM

## 2015-03-08 DIAGNOSIS — Z95810 Presence of automatic (implantable) cardiac defibrillator: Secondary | ICD-10-CM

## 2015-03-08 HISTORY — DX: Presence of automatic (implantable) cardiac defibrillator: Z95.810

## 2015-03-08 HISTORY — PX: EP IMPLANTABLE DEVICE: SHX172B

## 2015-03-08 HISTORY — DX: Benign prostatic hyperplasia with lower urinary tract symptoms: N40.1

## 2015-03-08 HISTORY — DX: Hesitancy of micturition: R39.11

## 2015-03-08 HISTORY — DX: Malignant melanoma of unspecified lower limb, including hip: C43.70

## 2015-03-08 LAB — SURGICAL PCR SCREEN
MRSA, PCR: NEGATIVE
Staphylococcus aureus: NEGATIVE

## 2015-03-08 SURGERY — BIV ICD INSERTION CRT-D

## 2015-03-08 MED ORDER — CEFAZOLIN SODIUM-DEXTROSE 2-3 GM-% IV SOLR
INTRAVENOUS | Status: AC
  Filled 2015-03-08: qty 50

## 2015-03-08 MED ORDER — SODIUM CHLORIDE 0.9 % IJ SOLN
3.0000 mL | Freq: Two times a day (BID) | INTRAMUSCULAR | Status: DC
  Administered 2015-03-08 – 2015-03-09 (×2): 3 mL via INTRAVENOUS

## 2015-03-08 MED ORDER — CEFAZOLIN SODIUM-DEXTROSE 2-3 GM-% IV SOLR: 2.0000 g | INTRAVENOUS | Status: DC

## 2015-03-08 MED ORDER — LIDOCAINE HCL (PF) 1 % IJ SOLN
INTRAMUSCULAR | Status: AC
  Filled 2015-03-08: qty 30

## 2015-03-08 MED ORDER — SODIUM CHLORIDE 0.9 % IV SOLN
INTRAVENOUS | Status: DC
  Administered 2015-03-08: 1000 mL via INTRAVENOUS

## 2015-03-08 MED ORDER — FENTANYL CITRATE (PF) 100 MCG/2ML IJ SOLN
INTRAMUSCULAR | Status: AC
  Filled 2015-03-08: qty 2

## 2015-03-08 MED ORDER — PANCRELIPASE (LIP-PROT-AMYL) 36000-114000 UNITS PO CPEP
36000.0000 [IU] | ORAL_CAPSULE | Freq: Three times a day (TID) | ORAL | Status: DC
  Filled 2015-03-08 (×4): qty 1

## 2015-03-08 MED ORDER — ACETAMINOPHEN 325 MG PO TABS: 325.0000 mg | ORAL_TABLET | ORAL | Status: DC | PRN

## 2015-03-08 MED ORDER — TRAMADOL HCL 50 MG PO TABS
50.0000 mg | ORAL_TABLET | Freq: Three times a day (TID) | ORAL | Status: DC
  Administered 2015-03-08 – 2015-03-09 (×2): 50 mg via ORAL
  Filled 2015-03-08 (×2): qty 1

## 2015-03-08 MED ORDER — CEFAZOLIN SODIUM 1-5 GM-% IV SOLN
1.0000 g | Freq: Four times a day (QID) | INTRAVENOUS | Status: AC
  Administered 2015-03-08 – 2015-03-09 (×3): 1 g via INTRAVENOUS
  Filled 2015-03-08 (×3): qty 50

## 2015-03-08 MED ORDER — FERROUS SULFATE 325 (65 FE) MG PO TABS
325.0000 mg | ORAL_TABLET | Freq: Three times a day (TID) | ORAL | Status: DC
  Administered 2015-03-09: 09:00:00 325 mg via ORAL
  Filled 2015-03-08 (×4): qty 1

## 2015-03-08 MED ORDER — FENTANYL CITRATE (PF) 100 MCG/2ML IJ SOLN
INTRAMUSCULAR | Status: DC | PRN
  Administered 2015-03-08 (×5): 12.5 ug via INTRAVENOUS

## 2015-03-08 MED ORDER — MIDAZOLAM HCL 5 MG/5ML IJ SOLN
INTRAMUSCULAR | Status: AC
  Filled 2015-03-08: qty 5

## 2015-03-08 MED ORDER — LIDOCAINE HCL (PF) 1 % IJ SOLN
INTRAMUSCULAR | Status: DC | PRN
  Administered 2015-03-08: 70 mL via SUBCUTANEOUS

## 2015-03-08 MED ORDER — GABAPENTIN 100 MG PO CAPS
200.0000 mg | ORAL_CAPSULE | Freq: Two times a day (BID) | ORAL | Status: DC
  Administered 2015-03-08 – 2015-03-09 (×2): 200 mg via ORAL
  Filled 2015-03-08 (×2): qty 2

## 2015-03-08 MED ORDER — DORZOLAMIDE HCL-TIMOLOL MAL 2-0.5 % OP SOLN
1.0000 [drp] | Freq: Two times a day (BID) | OPHTHALMIC | Status: DC
Start: 2015-03-08 — End: 2015-03-09
  Administered 2015-03-08 – 2015-03-09 (×2): 1 [drp] via OPHTHALMIC
  Filled 2015-03-08 (×2): qty 10

## 2015-03-08 MED ORDER — HYDROCODONE-ACETAMINOPHEN 5-325 MG PO TABS: 1.0000 | ORAL_TABLET | ORAL | Status: DC | PRN

## 2015-03-08 MED ORDER — MUPIROCIN 2 % EX OINT: 1.0000 "application " | TOPICAL_OINTMENT | Freq: Once | CUTANEOUS | Status: DC

## 2015-03-08 MED ORDER — DEXTROSE 5 % IV SOLN
2.0000 g | INTRAVENOUS | Status: DC | PRN
  Administered 2015-03-08: 2 g via INTRAVENOUS

## 2015-03-08 MED ORDER — CARVEDILOL 12.5 MG PO TABS
12.5000 mg | ORAL_TABLET | Freq: Two times a day (BID) | ORAL | Status: DC
  Administered 2015-03-08 – 2015-03-09 (×2): 12.5 mg via ORAL
  Filled 2015-03-08 (×2): qty 1

## 2015-03-08 MED ORDER — SODIUM CHLORIDE 0.9 % IJ SOLN: 3.0000 mL | INTRAMUSCULAR | Status: DC | PRN

## 2015-03-08 MED ORDER — YOU HAVE A PACEMAKER BOOK
Freq: Once | Status: AC
  Administered 2015-03-08: 21:00:00
  Filled 2015-03-08: qty 1

## 2015-03-08 MED ORDER — TAMSULOSIN HCL 0.4 MG PO CAPS
0.4000 mg | ORAL_CAPSULE | Freq: Every day | ORAL | Status: DC
  Administered 2015-03-08: 21:00:00 0.4 mg via ORAL
  Filled 2015-03-08: qty 1

## 2015-03-08 MED ORDER — LOSARTAN POTASSIUM 50 MG PO TABS
100.0000 mg | ORAL_TABLET | Freq: Every day | ORAL | Status: DC
  Administered 2015-03-08 – 2015-03-09 (×2): 100 mg via ORAL
  Filled 2015-03-08 (×2): qty 2

## 2015-03-08 MED ORDER — FINASTERIDE 5 MG PO TABS
5.0000 mg | ORAL_TABLET | Freq: Every morning | ORAL | Status: DC
  Administered 2015-03-09: 09:00:00 5 mg via ORAL
  Filled 2015-03-08: qty 1

## 2015-03-08 MED ORDER — FUROSEMIDE 20 MG PO TABS
20.0000 mg | ORAL_TABLET | Freq: Every day | ORAL | Status: DC
  Administered 2015-03-09: 09:00:00 20 mg via ORAL
  Filled 2015-03-08: qty 1

## 2015-03-08 MED ORDER — MIDAZOLAM HCL 5 MG/5ML IJ SOLN
INTRAMUSCULAR | Status: DC | PRN
  Administered 2015-03-08 (×5): 1 mg via INTRAVENOUS

## 2015-03-08 MED ORDER — POTASSIUM CHLORIDE CRYS ER 20 MEQ PO TBCR
20.0000 meq | EXTENDED_RELEASE_TABLET | Freq: Every morning | ORAL | Status: DC
  Administered 2015-03-09: 09:00:00 20 meq via ORAL
  Filled 2015-03-08: qty 1

## 2015-03-08 MED ORDER — SODIUM CHLORIDE 0.9 % IR SOLN: 80.0000 mg | Status: DC

## 2015-03-08 MED ORDER — SODIUM CHLORIDE 0.9 % IV SOLN: 250.0000 mL | INTRAVENOUS | Status: DC | PRN

## 2015-03-08 MED ORDER — CHLORHEXIDINE GLUCONATE 4 % EX LIQD: 60.0000 mL | Freq: Once | CUTANEOUS | Status: DC

## 2015-03-08 MED ORDER — ONDANSETRON HCL 4 MG/2ML IJ SOLN: 4.0000 mg | Freq: Four times a day (QID) | INTRAMUSCULAR | Status: DC | PRN

## 2015-03-08 MED ORDER — AMIODARONE HCL 200 MG PO TABS
200.0000 mg | ORAL_TABLET | Freq: Every day | ORAL | Status: DC
  Administered 2015-03-09: 200 mg via ORAL
  Filled 2015-03-08: qty 1

## 2015-03-08 MED ORDER — MUPIROCIN 2 % EX OINT
TOPICAL_OINTMENT | CUTANEOUS | Status: AC
  Administered 2015-03-08: 1
  Filled 2015-03-08: qty 22

## 2015-03-08 MED ORDER — IOHEXOL 350 MG/ML SOLN
INTRAVENOUS | Status: DC | PRN
  Administered 2015-03-08: 15 mL via INTRAVENOUS

## 2015-03-08 MED ORDER — HEPARIN (PORCINE) IN NACL 2-0.9 UNIT/ML-% IJ SOLN
INTRAMUSCULAR | Status: AC
  Filled 2015-03-08: qty 500

## 2015-03-08 MED ORDER — SODIUM CHLORIDE 0.9 % IR SOLN
Status: AC
  Filled 2015-03-08: qty 2

## 2015-03-08 SURGICAL SUPPLY — 14 items
CABLE SURGICAL S-101-97-12 (CABLE) ×3 IMPLANT
CATH ATTAIN COMMAND 6250-MB2 (CATHETERS) ×3 IMPLANT
CATH HEXAPOLAR DAMATO 6F (CATHETERS) ×3 IMPLANT
ICD VIVA QUAD XT CRT-D DTBA1QQ (ICD Generator) ×3 IMPLANT
KIT ESSENTIALS PG (KITS) ×3 IMPLANT
LEAD ATTAIN PERFORMA S 4598-88 (Lead) ×3 IMPLANT
LEAD CAPSURE NOVUS 5076-52CM (Lead) ×3 IMPLANT
LEAD SPRINT QUAT SEC 6935M-62 (Lead) ×3 IMPLANT
PAD DEFIB LIFELINK (PAD) ×3 IMPLANT
SHEATH CLASSIC 7F (SHEATH) ×3 IMPLANT
SHEATH CLASSIC 9.5F (SHEATH) ×3 IMPLANT
SHEATH CLASSIC 9F (SHEATH) ×3 IMPLANT
TRAY PACEMAKER INSERTION (CUSTOM PROCEDURE TRAY) ×3 IMPLANT
WIRE ACUITY WHISPER EDS 4648 (WIRE) ×6 IMPLANT

## 2015-03-08 NOTE — Discharge Summary (Signed)
ELECTROPHYSIOLOGY PROCEDURE DISCHARGE SUMMARY    Patient ID: Chad Brown,  MRN: [id number], DOB/AGE: 74/17/1942 74 y.o.  Admit date: 03/08/2015 Discharge date: 03/09/2015  Primary Care Physician: Sherrie Mustache, MD Primary Cardiologist: Johnsie Cancel Electrophysiologist: Radley Teston  Primary Discharge Diagnosis:  Non-ischemic cardiomyopathy, congestive heart failure, LBBB status post CRTD implant this admission  Secondary Discharge Diagnosis:  1.  Persistent atrial fibrillation 2.  Hypertension 3.  Hyperlipidemia 4.  Sinus bradycardia  Allergies  Allergen Reactions  . Zithromax [Azithromycin] Diarrhea     Procedures This Admission:  1.  Implantation of a MDT CRTD on 03/08/15 by Dr Rayann Heman.  The patient received a MDT model number Hillery Aldo CRTD with model number 5076 right atrial lead, 6935 right ventricular lead, and 4958 left ventricular lead.  DFT's were deferred at time of implant.  There were no immediate post procedure complications. 2.  CXR on 03/09/15 demonstrated no pneumothorax status post device implantation.   Brief HPI: Chad Brown is a 74 y.o. male was referred to electrophysiology in the outpatient setting for consideration of ICD implantation.  Past medical history includes non ischemic cardiomyopathy, congestive heart failure, and LBBB.  The patient has persistent LV dysfunction despite guideline directed therapy.  Risks, benefits, and alternatives to ICD implantation were reviewed with the patient who wished to proceed.   Hospital Course:  The patient was admitted and underwent implantation of a MDT CRTD with details as outlined above. He was monitored on telemetry overnight which demonstrated sinus with BiV pacing.  Left chest was without hematoma or ecchymosis.  The device was interrogated and found to be functioning normally.  CXR was obtained and demonstrated no pneumothorax status post device implantation.  Wound care, arm mobility, and restrictions were  reviewed with the patient.  The patient was examined and considered stable for discharge to home.   The patient's discharge medications include an ARB (Losartan) and beta blocker (Coreg).   Physical Exam: Filed Vitals:   03/08/15 2000 03/08/15 2359 03/09/15 0022 03/09/15 0629  BP: 147/62 187/78  182/85  Pulse: 55 56  55  Temp: 98.6 F (37 C) 98.1 F (36.7 C)  98.4 F (36.9 C)  TempSrc: Oral Oral  Oral  Resp: 13 14  18   Height:      Weight:   91.9 kg (202 lb 9.6 oz)   SpO2: 93% 96%  95%    GEN- The patient is well appearing, alert and oriented x 3 today.   HEENT: normocephalic, atraumatic; sclera clear, conjunctiva pink; hearing intact; oropharynx clear; neck supple, no JVP Lymph- no cervical lymphadenopathy Lungs- Clear to ausculation bilaterally, normal work of breathing.  No wheezes, rales, rhonchi Heart- Regular rate and rhythm, no murmurs, rubs or gallops, PMI not laterally displaced GI- soft, non-tender, non-distended, bowel sounds present, no hepatosplenomegaly Extremities- no clubbing, cyanosis, or edema; DP/PT/radial pulses 2+ bilaterally MS- no significant deformity or atrophy Skin- warm and dry, no rash or lesion, left chest without hematoma/ecchymosis Psych- euthymic mood, full affect Neuro- strength and sensation are intact   Labs:   Lab Results  Component Value Date   WBC 4.8 03/02/2015   HGB 10.5* 03/02/2015   HCT 32.2* 03/02/2015   MCV 85.0 03/02/2015   PLT 179.0 03/02/2015     Recent Labs Lab 03/09/15 0355  NA 140  K 4.0  CL 104  CO2 27  BUN 11  CREATININE 0.92  CALCIUM 8.5*  GLUCOSE 96    Discharge Medications:    Medication  List    TAKE these medications        acetaminophen 500 MG tablet  Commonly known as:  TYLENOL  Take 1,000 mg by mouth every 4 (four) hours as needed for moderate pain or headache.     amiodarone 200 MG tablet  Commonly known as:  PACERONE  Take 200 mg by mouth daily.     carvedilol 12.5 MG tablet  Commonly  known as:  COREG  Take 1 tablet (12.5 mg total) by mouth 2 (two) times daily with a meal.     dorzolamide-timolol 22.3-6.8 MG/ML ophthalmic solution  Commonly known as:  COSOPT  Place 1 drop into the right eye 2 (two) times daily.     finasteride 5 MG tablet  Commonly known as:  PROSCAR  Take 5 mg by mouth every morning.     furosemide 20 MG tablet  Commonly known as:  LASIX  Take 1 tablet by mouth once a day     gabapentin 100 MG capsule  Commonly known as:  NEURONTIN  Take 2 capsules by mouth in the morning and 3 capsules by mouth at night     Iron 325 (65 FE) MG Tabs  Take 325 mg by mouth 3 (three) times daily.     IRON PO  Take 1 tablet by mouth 3 (three) times daily.     lipase/protease/amylase 36000 UNITS Cpep capsule  Commonly known as:  CREON  Take 1 capsule (36,000 Units total) by mouth 3 (three) times daily before meals. And with snacks     losartan 100 MG tablet  Commonly known as:  COZAAR  TAKE 1 TABLET DAILY     NIASPAN 500 MG CR tablet  Generic drug:  niacin  Take 1,000 mg by mouth at bedtime.     potassium chloride SA 20 MEQ tablet  Commonly known as:  K-DUR,KLOR-CON  Take 20 mEq by mouth every morning.     rivaroxaban 20 MG Tabs tablet  Commonly known as:  XARELTO  Take 1 tablet (20 mg total) by mouth daily with supper. Restart tomorrow 02/11/2015     SYSTANE ULTRA OP  Place 1 drop into the left eye 2 (two) times daily.     tamsulosin 0.4 MG Caps capsule  Commonly known as:  FLOMAX  Take 0.4 mg by mouth at bedtime.     traMADol 50 MG tablet  Commonly known as:  ULTRAM  Take 1 tablet (50 mg total) by mouth every 8 (eight) hours.        Disposition:  Discharge Instructions    Diet - low sodium heart healthy    Complete by:  As directed      Increase activity slowly    Complete by:  As directed           Follow-up Information    Follow up with [facility] On 03/17/2015.   Why:  at 9:30AM for wound check   Contact information:     [address]  [place] [phone]       Duration of Discharge Encounter: Greater than 30 minutes including physician time.  Signed, Chanetta Marshall, NP 03/09/2015 8:13 AM  I have seen, examined the patient, and reviewed the above assessment and plan.  On exam, RRR.  ICD pocket looks ok. Changes to above are made where necessary.    Co Sign: Thompson Grayer, MD 03/09/2015 8:14 AM

## 2015-03-08 NOTE — Discharge Instructions (Signed)
° ° °  Supplemental Discharge Instructions for  Pacemaker/Defibrillator Patients  Activity No heavy lifting or vigorous activity with your left/right arm for 6 to 8 weeks.  Do not raise your left/right arm above your head for one week.  Gradually raise your affected arm as drawn below.           __    03/12/15                      03/13/15                    03/14/15                  03/15/15  NO DRIVING for   1 week  ; you may begin driving on  [id number]   .  WOUND CARE - Keep the wound area clean and dry.  Do not get this area wet for one week. No showers for one week; you may shower on   03/15/15  . - The tape/steri-strips on your wound will fall off; do not pull them off.  No bandage is needed on the site.  DO  NOT apply any creams, oils, or ointments to the wound area. - If you notice any drainage or discharge from the wound, any swelling or bruising at the site, or you develop a fever > 101? F after you are discharged home, call the office at once.  Special Instructions - You are still able to use cellular telephones; use the ear opposite the side where you have your pacemaker/defibrillator.  Avoid carrying your cellular phone near your device. - When traveling through airports, show security personnel your identification card to avoid being screened in the metal detectors.  Ask the security personnel to use the hand wand. - Avoid arc welding equipment, MRI testing (magnetic resonance imaging), TENS units (transcutaneous nerve stimulators).  Call the office for questions about other devices. - Avoid electrical appliances that are in poor condition or are not properly grounded. - Microwave ovens are safe to be near or to operate.

## 2015-03-08 NOTE — Interval H&P Note (Signed)
History and Physical Interval Note:  03/08/2015 2:58 PM  ICD Criteria  Current LVEF:34% ;Obtained < 1 month ago.  NYHA Functional Classification: Class III  Heart Failure History:  Yes, Duration of heart failure since onset is > 9 months  Non-Ischemic Dilated Cardiomyopathy History:  Yes, timeframe is > 9 months  Atrial Fibrillation/Atrial Flutter:  Yes, A-Fib/A-Flutter type: Persistent (>7 days).  Ventricular Tachycardia History:  No.  Cardiac Arrest History:  No  History of Syndromes with Risk of Sudden Death:  No.  Previous ICD:  No.  Electrophysiology Study: No.  Prior MI: Yes, Most recent MI timeframe is > 40 days.  PPM: No.  OSA:  No  Patient Life Expectancy of >=1 year: Yes.  Anticoagulation Therapy:  Patient is on anticoagulation therapy, anticoagulation was held prior to procedure.   Beta Blocker Therapy:  Yes.   Ace Inhibitor/ARB Therapy:  Yes.   QRS by ekg today is 134 msec with LBBB.   [facility]IM[facility] MALTA  has presented today for surgery, with the diagnosis of lv disfunction/cm/rotation  The various methods of treatment have been discussed with the patient and family. After consideration of risks, benefits and other options for treatment, the patient has consented to  Procedure(s): BiV ICD Insertion CRT-D (N/A) as a surgical intervention .  The patient's history has been reviewed, patient examined, no change in status, stable for surgery.  I have reviewed the patient's chart and labs.  Questions were answered to the patient's satisfaction.     Chad Brown

## 2015-03-08 NOTE — H&P (View-Only) (Signed)
Electrophysiology Office Note   Date:  03/02/2015   ID:  Chad Brown, DOB 1940-12-25, MRN [id number]  PCP:  Sherrie Mustache, MD  Cardiologist:  Dr Johnsie Cancel Primary Electrophysiologist: Thompson Grayer, MD    Chief Complaint  Patient presents with  . Atrial Fibrillation  . CHF     History of Present Illness: Chad Brown is a 74 y.o. male who presents today for electrophysiology evaluation.   He presents for EP consultation regarding risks of sudden death.  He reports having a heart attack in 1998.  He has not required stent or bypass.  He presented with rapid atrial fibrillation and heart failure 4/14 . He was admitted to the hospital and underwent TEE guided cardioversion. He is diuresed approximately 20 pounds since then and feels so much better.  2-D echo in the hospital showed an EF of 30-35% with diffuse hypokinesis and moderately dilated LA and mild MR.  He has fatigue.  He thinks that he is little more by arthritis than his breathing.  He does not think that he could walk a mile.  He hasnt felt like playing golf in "quite a while" due to weakness.  He developed recurrent afib in January of this year for which he again required cardioversion. He is chronically anticoagulated with xarelto. He is also on amiodarone.  He snores and "stops breathing" at time.  He has not yet had sleep study though this has been discussed.   He has a h/o melanoma of the left thigh.  Was seen at [facility] and had surgery-->lymphangiogram was ok  Today, he denies symptoms of palpitations, chest pain,  orthopnea, PND, lower extremity edema, claudication, dizziness, presyncope, syncope, bleeding, or neurologic sequela. The patient is tolerating medications without difficulties and is otherwise without complaint today.    Past Medical History  Diagnosis Date  . Hyperlipidemia   . Hypertension   . Myocardial infarction 1998  . Systolic CHF     a. New dx 12/2012 ?NICM, may be r/t afib. b. Nuc 03/2013 -  normal.  . PAF (paroxysmal atrial fibrillation)     a. Dx 12/2012, s/p TEE/DCCV 01/26/13. b. On Xarelto.  . Arthritis     "about all my joints" (01/26/2013)  . Chronic back pain   . Melanoma of eye 2000's    "right", left leg  . Sinus bradycardia 10/02/2014  . Anemia     iron deficient  . Cholelithiasis   . Atherosclerosis   . Colon polyp, hyperplastic 5/16    removed precancerous lesions  . Skin cancer     prior melanoma resection   Past Surgical History  Procedure Laterality Date  . Back surgery  03/2000    "ground calcium deposits from upper thoracic" (01/26/2013)  . Total hip arthroplasty Right 06/2007  . Cataract extraction Right ~ 2006  . Glaucoma surgery Right ~ 2006    "put 3 stents in to drain fluid" (01/26/2013)  . Refractive surgery Right ~ 2006    "twice; both done at [place]" (01/26/2013)  . Surgery scrotal / testicular Right 1990's  . Incision and drainage abscess posterior cervicalspine  05/2012  . Cardiac catheterization  1998  . Tee without cardioversion N/A 01/26/2013    Procedure: TRANSESOPHAGEAL ECHOCARDIOGRAM (TEE);  Surgeon: Lelon Perla, MD;  Location: [place];  Service: Cardiovascular;  Laterality: N/A;  [name] anes. /   . Cardioversion N/A 01/26/2013    Procedure: CARDIOVERSION;  Surgeon: Lelon Perla, MD;  Location: [place];  Service:  Cardiovascular;  Laterality: N/A;  . Tee without cardioversion N/A 10/05/2014    Procedure: TRANSESOPHAGEAL ECHOCARDIOGRAM (TEE)  with cardioversion;  Surgeon: Thayer Headings, MD;  Location: [facility] ENDOSCOPY;  Service: Cardiovascular;  Laterality: N/A;  12:52 synched cardioversion at 120 joules,...afib to SR...12 lead EKG ordered.Marland KitchenMarland KitchenCardiozem d/c'ed per MD verbal order at [facility]  . Melanoma excision Left     leg  . Colonoscopy with propofol N/A 02/10/2015    Procedure: COLONOSCOPY WITH PROPOFOL;  Surgeon: Gatha Mayer, MD;  Location: [facility] ENDOSCOPY;  Service: Endoscopy;  Laterality: N/A;     Current Outpatient Prescriptions    Medication Sig Dispense Refill  . acetaminophen (TYLENOL) 500 MG tablet Take 1,000 mg by mouth every 4 (four) hours as needed for moderate pain or headache.    [name] amiodarone (PACERONE) 200 MG tablet Take 200 mg by mouth daily.     . carvedilol (COREG) 12.5 MG tablet Take 1 tablet (12.5 mg total) by mouth 2 (two) times daily with a meal. 60 tablet 11  . dorzolamide-timolol (COSOPT) 22.3-6.8 MG/ML ophthalmic solution Place 1 drop into the right eye 2 (two) times daily.    . finasteride (PROSCAR) 5 MG tablet Take 5 mg by mouth every morning.     . furosemide (LASIX) 20 MG tablet Take 1 tablet by mouth once a day (Patient taking differently: Take 1 tablet by mouth every morning.) 90 tablet 2  . gabapentin (NEURONTIN) 100 MG capsule Take 2 capsules by mouth in the morning and 3 capsules by mouth at night    . IRON PO Take 1 tablet by mouth 3 (three) times daily.     . lipase/protease/amylase (CREON) 36000 UNITS CPEP capsule Take 1 capsule (36,000 Units total) by mouth 3 (three) times daily before meals. And with snacks 150 capsule 1  . losartan (COZAAR) 100 MG tablet TAKE 1 TABLET DAILY (Patient taking differently: TAKE 1 TABLE BY MOUTH NIGHTLY.) 30 tablet 11  . NIASPAN 500 MG CR tablet Take 1,000 mg by mouth at bedtime.     [name] Glycol-Propyl Glycol (SYSTANE ULTRA OP) Apply 1 drop to eye 2 (two) times daily.    . potassium chloride SA (K-DUR,KLOR-CON) 20 MEQ tablet Take 20 mEq by mouth every morning.     . rivaroxaban (XARELTO) 20 MG TABS tablet Take 1 tablet (20 mg total) by mouth daily with supper. Restart tomorrow 02/11/2015 90 tablet 2  . tamsulosin (FLOMAX) 0.4 MG CAPS Take 0.8 mg by mouth at bedtime.     . traMADol (ULTRAM) 50 MG tablet Take 1 tablet (50 mg total) by mouth every 8 (eight) hours. 90 tablet 1   No current facility-administered medications for this visit.    Allergies:   Zithromax   Social History:  The patient  reports that he quit smoking about 14 years ago. His  smoking use included Cigarettes. He has a 144 pack-year smoking history. He has never used smokeless tobacco. He reports that he does not drink alcohol or use illicit drugs.   Family History:  The patient's  family history includes Colon cancer in his paternal uncle; Diabetes in his maternal aunt, maternal grandmother, maternal uncle, and mother; Heart attack in his maternal uncle; Heart disease in his mother; Kidney disease in his mother; Leukemia in his father; Lung cancer in his paternal uncle; Prostate cancer in his paternal uncle.    ROS:  Please see the history of present illness.   All other systems are reviewed and negative.  PHYSICAL EXAM: VS:  BP 164/82 mmHg  Pulse 57  Ht 5\' 6"  (1.676 m)  Wt 92.262 kg (203 lb 6.4 oz)  BMI 32.85 kg/m2 , BMI Body mass index is 32.85 kg/(m^2). GEN: Well nourished, well developed, in no acute distress HEENT: normal Neck: no JVD, carotid bruits, or masses Cardiac: RRR; no murmurs, rubs, or gallops,no edema  Respiratory:  clear to auscultation bilaterally, normal work of breathing GI: soft, nontender, nondistended, + BS MS: no deformity or atrophy Skin: warm and dry  Neuro:  Strength and sensation are intact Psych: euthymic mood, full affect  EKG:  EKG is ordered today. The ekg ordered today shows sinus rhythm with LBBB, QRS 134 msec   Recent Labs: 10/02/2014: B Natriuretic Peptide 1005.2* 10/08/2014: Magnesium 2.3 10/29/2014: Pro B Natriuretic peptide (BNP) 385.0*; TSH 2.01 03/01/2015: ALT 21; BUN 8; Creatinine 0.84; Hemoglobin 10.3*; Platelets 201; Potassium 3.3*; Sodium 140    Lipid Panel  No results found for: CHOL, TRIG, HDL, CHOLHDL, VLDL, LDLCALC, LDLDIRECT   Wt Readings from Last 3 Encounters:  03/02/15 92.262 kg (203 lb 6.4 oz)  03/01/15 92.67 kg (204 lb 4.8 oz)  02/10/15 93.895 kg (207 lb)      Other studies Reviewed: Additional studies/ records that were reviewed today include: Dr Mariana Arn notes  Review of the above records  today demonstrates: cardiac MRI reveals EF 34%   ASSESSMENT AND PLAN:  1.  Nonischemic CM/ chronic systolic dysfunction The patient has a nonischemic CM (EF 34%), NYHA Class II/III CHF, and LBBB. At this time, he meets SCD-HeFT criteria for ICD implantation for primary prevention of sudden death.  Given LBBB QRS >150 msec, he may also be a candidate for CRT.  Risks, benefits, alternatives to BiV ICD implantation were discussed in detail with the patient today. The patient  understands that the risks include but are not limited to bleeding, infection, pneumothorax, perforation, tamponade, vascular damage, renal failure, MI, stroke, death, inappropriate shocks, and lead dislodgement and wishes to proceed.  We will therefore schedule device implantation at the next available time.  2. Snoring Sleep study  3. Persistent atrial fibrillation Continue amiodarone chads2vasc score is at least 4.  Continue xarelto. Hold xarelto for 48 hours prior to ICD implant   Current medicines are reviewed at length with the patient today.   The patient does not have concerns regarding his medicines.  The following changes were made today:  none   Signed, Thompson Grayer, MD  03/02/2015 9:23 AM     [facility] [address] [place] [address] [phone] (office) [phone] (fax)

## 2015-03-09 ENCOUNTER — Ambulatory Visit (HOSPITAL_COMMUNITY): Payer: Medicare Other

## 2015-03-09 DIAGNOSIS — I481 Persistent atrial fibrillation: Secondary | ICD-10-CM | POA: Diagnosis not present

## 2015-03-09 DIAGNOSIS — I252 Old myocardial infarction: Secondary | ICD-10-CM | POA: Diagnosis not present

## 2015-03-09 DIAGNOSIS — I447 Left bundle-branch block, unspecified: Secondary | ICD-10-CM | POA: Diagnosis not present

## 2015-03-09 DIAGNOSIS — I429 Cardiomyopathy, unspecified: Secondary | ICD-10-CM | POA: Diagnosis not present

## 2015-03-09 LAB — BASIC METABOLIC PANEL
ANION GAP: 9 (ref 5–15)
BUN: 11 mg/dL (ref 6–20)
CO2: 27 mmol/L (ref 22–32)
CREATININE: 0.92 mg/dL (ref 0.61–1.24)
Calcium: 8.5 mg/dL — ABNORMAL LOW (ref 8.9–10.3)
Chloride: 104 mmol/L (ref 101–111)
GFR calc non Af Amer: >60 mL/min (ref >60)
Glucose, Bld: 96 mg/dL (ref 65–99)
Potassium: 4 mmol/L (ref 3.5–5.1)
SODIUM: 140 mmol/L (ref 135–145)

## 2015-03-09 MED ORDER — YOU HAVE A PACEMAKER BOOK
Freq: Once | Status: AC
  Administered 2015-03-09: 09:00:00
  Filled 2015-03-09 (×2): qty 1

## 2015-03-10 ENCOUNTER — Encounter (HOSPITAL_COMMUNITY): Payer: Self-pay | Admitting: Internal Medicine

## 2015-03-17 ENCOUNTER — Ambulatory Visit (INDEPENDENT_AMBULATORY_CARE_PROVIDER_SITE_OTHER): Payer: Medicare Other | Admitting: *Deleted

## 2015-03-17 DIAGNOSIS — I4891 Unspecified atrial fibrillation: Secondary | ICD-10-CM

## 2015-03-17 DIAGNOSIS — I429 Cardiomyopathy, unspecified: Secondary | ICD-10-CM | POA: Diagnosis not present

## 2015-03-17 DIAGNOSIS — I428 Other cardiomyopathies: Secondary | ICD-10-CM

## 2015-03-17 DIAGNOSIS — I5022 Chronic systolic (congestive) heart failure: Secondary | ICD-10-CM

## 2015-03-17 DIAGNOSIS — I426 Alcoholic cardiomyopathy: Secondary | ICD-10-CM | POA: Diagnosis not present

## 2015-03-17 DIAGNOSIS — R001 Bradycardia, unspecified: Secondary | ICD-10-CM

## 2015-03-17 LAB — CUP PACEART INCLINIC DEVICE CHECK
Battery Voltage: 3.11 V
Brady Statistic AS VP Percent: 50.39 %
Brady Statistic RA Percent Paced: 25.18 %
Brady Statistic RV Percent Paced: 42.18 %
Date Time Interrogation Session: 20160616101114
HIGH POWER IMPEDANCE MEASURED VALUE: 171 Ohm
HIGH POWER IMPEDANCE MEASURED VALUE: 52 Ohm
Lead Channel Impedance Value: 437 Ohm
Lead Channel Pacing Threshold Pulse Width: 0.4 ms
Lead Channel Pacing Threshold Pulse Width: 0.4 ms
Lead Channel Sensing Intrinsic Amplitude: 0.75 mV
Lead Channel Sensing Intrinsic Amplitude: 14.375 mV
Lead Channel Sensing Intrinsic Amplitude: 22.625 mV
Lead Channel Setting Pacing Amplitude: 3.5 V
Lead Channel Setting Pacing Amplitude: 3.5 V
Lead Channel Setting Pacing Amplitude: 3.5 V
Lead Channel Setting Pacing Pulse Width: 0.4 ms
Lead Channel Setting Pacing Pulse Width: 0.4 ms
MDC IDC MSMT BATTERY REMAINING LONGEVITY: 98 mo
MDC IDC MSMT LEADCHNL LV PACING THRESHOLD AMPLITUDE: 0.875 V
MDC IDC MSMT LEADCHNL RA IMPEDANCE VALUE: 437 Ohm
MDC IDC MSMT LEADCHNL RA PACING THRESHOLD AMPLITUDE: 0.5 V
MDC IDC MSMT LEADCHNL RA PACING THRESHOLD PULSEWIDTH: 0.4 ms
MDC IDC MSMT LEADCHNL RA SENSING INTR AMPL: 1.25 mV
MDC IDC MSMT LEADCHNL RV PACING THRESHOLD AMPLITUDE: 0.625 V
MDC IDC SET LEADCHNL RV SENSING SENSITIVITY: 0.3 mV
MDC IDC SET ZONE DETECTION INTERVAL: 350 ms
MDC IDC STAT BRADY AP VP PERCENT: 24.69 %
MDC IDC STAT BRADY AP VS PERCENT: 0.49 %
MDC IDC STAT BRADY AS VS PERCENT: 24.43 %
Zone Setting Detection Interval: 300 ms
Zone Setting Detection Interval: 330 ms
Zone Setting Detection Interval: 360 ms

## 2015-03-17 NOTE — Progress Notes (Signed)
CRT-D Wound check appointment. Steri-strips removed. Wound without redness or edema. Incision edges approximated, wound well healed. Normal device function. Thresholds, sensing, and impedances consistent with implant measurements. Device programmed at 3.5V for extra safety margin until 3 month visit. Histogram distribution appropriate for patient and level of activity. 64.4% mode switch + xarelto. No ventricular arrhythmias noted. Patient educated about wound care, arm mobility, lifting restrictions, shock plan. ROV w/ JA 06/15/15.

## 2015-03-21 ENCOUNTER — Inpatient Hospital Stay (HOSPITAL_COMMUNITY)
Admission: EM | Admit: 2015-03-21 | Discharge: 2015-03-25 | DRG: 308 | Disposition: A | Discharge status: Home / Self Care | Payer: Medicare Other | Attending: Internal Medicine | Admitting: Internal Medicine

## 2015-03-21 ENCOUNTER — Encounter (HOSPITAL_COMMUNITY): Payer: Self-pay | Admitting: *Deleted

## 2015-03-21 ENCOUNTER — Emergency Department (HOSPITAL_COMMUNITY): Payer: Medicare Other

## 2015-03-21 DIAGNOSIS — I5023 Acute on chronic systolic (congestive) heart failure: Secondary | ICD-10-CM | POA: Diagnosis present

## 2015-03-21 DIAGNOSIS — I252 Old myocardial infarction: Secondary | ICD-10-CM

## 2015-03-21 DIAGNOSIS — Z95810 Presence of automatic (implantable) cardiac defibrillator: Secondary | ICD-10-CM | POA: Diagnosis not present

## 2015-03-21 DIAGNOSIS — Z806 Family history of leukemia: Secondary | ICD-10-CM

## 2015-03-21 DIAGNOSIS — I10 Essential (primary) hypertension: Secondary | ICD-10-CM | POA: Diagnosis present

## 2015-03-21 DIAGNOSIS — E669 Obesity, unspecified: Secondary | ICD-10-CM | POA: Diagnosis present

## 2015-03-21 DIAGNOSIS — Z87891 Personal history of nicotine dependence: Secondary | ICD-10-CM | POA: Diagnosis not present

## 2015-03-21 DIAGNOSIS — N401 Enlarged prostate with lower urinary tract symptoms: Secondary | ICD-10-CM | POA: Diagnosis present

## 2015-03-21 DIAGNOSIS — E611 Iron deficiency: Secondary | ICD-10-CM | POA: Diagnosis present

## 2015-03-21 DIAGNOSIS — I2510 Atherosclerotic heart disease of native coronary artery without angina pectoris: Secondary | ICD-10-CM | POA: Diagnosis present

## 2015-03-21 DIAGNOSIS — Z8249 Family history of ischemic heart disease and other diseases of the circulatory system: Secondary | ICD-10-CM | POA: Diagnosis not present

## 2015-03-21 DIAGNOSIS — Z6831 Body mass index (BMI) 31.0-31.9, adult: Secondary | ICD-10-CM

## 2015-03-21 DIAGNOSIS — I480 Paroxysmal atrial fibrillation: Principal | ICD-10-CM | POA: Diagnosis present

## 2015-03-21 DIAGNOSIS — N179 Acute kidney failure, unspecified: Secondary | ICD-10-CM | POA: Diagnosis present

## 2015-03-21 DIAGNOSIS — I340 Nonrheumatic mitral (valve) insufficiency: Secondary | ICD-10-CM | POA: Diagnosis not present

## 2015-03-21 DIAGNOSIS — D649 Anemia, unspecified: Secondary | ICD-10-CM | POA: Diagnosis present

## 2015-03-21 DIAGNOSIS — R3911 Hesitancy of micturition: Secondary | ICD-10-CM | POA: Diagnosis present

## 2015-03-21 DIAGNOSIS — I428 Other cardiomyopathies: Secondary | ICD-10-CM

## 2015-03-21 DIAGNOSIS — I447 Left bundle-branch block, unspecified: Secondary | ICD-10-CM | POA: Diagnosis present

## 2015-03-21 DIAGNOSIS — Z833 Family history of diabetes mellitus: Secondary | ICD-10-CM

## 2015-03-21 DIAGNOSIS — I4891 Unspecified atrial fibrillation: Secondary | ICD-10-CM

## 2015-03-21 DIAGNOSIS — E785 Hyperlipidemia, unspecified: Secondary | ICD-10-CM | POA: Diagnosis present

## 2015-03-21 DIAGNOSIS — Z96641 Presence of right artificial hip joint: Secondary | ICD-10-CM | POA: Diagnosis present

## 2015-03-21 DIAGNOSIS — R0602 Shortness of breath: Secondary | ICD-10-CM | POA: Diagnosis present

## 2015-03-21 DIAGNOSIS — C439 Malignant melanoma of skin, unspecified: Secondary | ICD-10-CM

## 2015-03-21 HISTORY — DX: Chronic systolic (congestive) heart failure: I50.22

## 2015-03-21 LAB — BRAIN NATRIURETIC PEPTIDE: B NATRIURETIC PEPTIDE 5: 968.4 pg/mL — AB (ref 0.0–100.0)

## 2015-03-21 LAB — CBC WITH DIFFERENTIAL/PLATELET
BASOS PCT: 1 % (ref 0–1)
Basophils Absolute: 0 10*3/uL (ref 0.0–0.1)
Eosinophils Absolute: 0.1 10*3/uL (ref 0.0–0.7)
Eosinophils Relative: 2 % (ref 0–5)
HEMATOCRIT: 37.6 % — AB (ref 39.0–52.0)
Hemoglobin: 11.7 g/dL — ABNORMAL LOW (ref 13.0–17.0)
Lymphocytes Relative: 18 % (ref 12–46)
Lymphs Abs: 1.2 10*3/uL (ref 0.7–4.0)
MCH: 27.6 pg (ref 26.0–34.0)
MCHC: 31.1 g/dL (ref 30.0–36.0)
MCV: 88.7 fL (ref 78.0–100.0)
Monocytes Absolute: 0.5 10*3/uL (ref 0.1–1.0)
Monocytes Relative: 7 % (ref 3–12)
NEUTROS ABS: 4.9 10*3/uL (ref 1.7–7.7)
Neutrophils Relative %: 72 % (ref 43–77)
Platelets: 140 10*3/uL — ABNORMAL LOW (ref 150–400)
RBC: 4.24 MIL/uL (ref 4.22–5.81)
RDW: 17.6 % — ABNORMAL HIGH (ref 11.5–15.5)
WBC: 6.8 10*3/uL (ref 4.0–10.5)

## 2015-03-21 LAB — I-STAT TROPONIN, ED: Troponin i, poc: 0.01 ng/mL (ref 0.00–0.08)

## 2015-03-21 LAB — BASIC METABOLIC PANEL
ANION GAP: 9 (ref 5–15)
BUN: 13 mg/dL (ref 6–20)
CO2: 30 mmol/L (ref 22–32)
Calcium: 8.8 mg/dL — ABNORMAL LOW (ref 8.9–10.3)
Chloride: 102 mmol/L (ref 101–111)
Creatinine, Ser: 1.38 mg/dL — ABNORMAL HIGH (ref 0.61–1.24)
GFR, EST AFRICAN AMERICAN: 57 mL/min — AB (ref >60)
GFR, EST NON AFRICAN AMERICAN: 49 mL/min — AB (ref >60)
GLUCOSE: 138 mg/dL — AB (ref 65–99)
POTASSIUM: 4 mmol/L (ref 3.5–5.1)
SODIUM: 141 mmol/L (ref 135–145)

## 2015-03-21 LAB — URINALYSIS, ROUTINE W REFLEX MICROSCOPIC
Bilirubin Urine: NEGATIVE
Glucose, UA: NEGATIVE mg/dL
Hgb urine dipstick: NEGATIVE
Ketones, ur: NEGATIVE mg/dL
Leukocytes, UA: NEGATIVE
NITRITE: NEGATIVE
PROTEIN: NEGATIVE mg/dL
Specific Gravity, Urine: 1.019 (ref 1.005–1.030)
UROBILINOGEN UA: 1 mg/dL (ref 0.0–1.0)
pH: 6.5 (ref 5.0–8.0)

## 2015-03-21 MED ORDER — DILTIAZEM LOAD VIA INFUSION
10.0000 mg | Freq: Once | INTRAVENOUS | Status: AC
Start: 2015-03-21 — End: 2015-03-21
  Administered 2015-03-21: 10 mg via INTRAVENOUS
  Filled 2015-03-21: qty 10

## 2015-03-21 MED ORDER — POLYVINYL ALCOHOL 1.4 % OP SOLN
1.0000 [drp] | Freq: Two times a day (BID) | OPHTHALMIC | Status: DC
  Administered 2015-03-21 – 2015-03-25 (×9): 1 [drp] via OPHTHALMIC
  Filled 2015-03-21: qty 15

## 2015-03-21 MED ORDER — SODIUM CHLORIDE 0.9 % IJ SOLN: 3.0000 mL | INTRAMUSCULAR | Status: DC | PRN

## 2015-03-21 MED ORDER — AMIODARONE HCL 200 MG PO TABS
200.0000 mg | ORAL_TABLET | Freq: Two times a day (BID) | ORAL | Status: DC
  Administered 2015-03-21 – 2015-03-25 (×9): 200 mg via ORAL
  Filled 2015-03-21 (×10): qty 1

## 2015-03-21 MED ORDER — NIACIN ER (ANTIHYPERLIPIDEMIC) 500 MG PO TBCR
1000.0000 mg | EXTENDED_RELEASE_TABLET | Freq: Every day | ORAL | Status: DC
  Administered 2015-03-21 – 2015-03-24 (×4): 1000 mg via ORAL
  Filled 2015-03-21 (×5): qty 2

## 2015-03-21 MED ORDER — GABAPENTIN 100 MG PO CAPS: 200.0000 mg | ORAL_CAPSULE | ORAL | Status: DC

## 2015-03-21 MED ORDER — POTASSIUM CHLORIDE CRYS ER 20 MEQ PO TBCR
20.0000 meq | EXTENDED_RELEASE_TABLET | Freq: Every morning | ORAL | Status: DC
  Administered 2015-03-21: 20 meq via ORAL
  Filled 2015-03-21 (×3): qty 1

## 2015-03-21 MED ORDER — DORZOLAMIDE HCL-TIMOLOL MAL 2-0.5 % OP SOLN
1.0000 [drp] | Freq: Two times a day (BID) | OPHTHALMIC | Status: DC
  Administered 2015-03-21 – 2015-03-25 (×9): 1 [drp] via OPHTHALMIC
  Filled 2015-03-21: qty 10

## 2015-03-21 MED ORDER — SODIUM CHLORIDE 0.9 % IJ SOLN
3.0000 mL | Freq: Two times a day (BID) | INTRAMUSCULAR | Status: DC
  Administered 2015-03-21 – 2015-03-25 (×8): 3 mL via INTRAVENOUS

## 2015-03-21 MED ORDER — PANCRELIPASE (LIP-PROT-AMYL) 36000-114000 UNITS PO CPEP
36000.0000 [IU] | ORAL_CAPSULE | Freq: Three times a day (TID) | ORAL | Status: DC
  Administered 2015-03-21 – 2015-03-25 (×3): 36000 [IU] via ORAL
  Filled 2015-03-21 (×16): qty 1

## 2015-03-21 MED ORDER — ACETAMINOPHEN 500 MG PO TABS: 1000.0000 mg | ORAL_TABLET | ORAL | Status: DC | PRN

## 2015-03-21 MED ORDER — FUROSEMIDE 10 MG/ML IJ SOLN
40.0000 mg | Freq: Two times a day (BID) | INTRAMUSCULAR | Status: DC
  Administered 2015-03-21: 40 mg via INTRAVENOUS
  Filled 2015-03-21: qty 4

## 2015-03-21 MED ORDER — IRON 325 (65 FE) MG PO TABS: 325.0000 mg | ORAL_TABLET | Freq: Three times a day (TID) | ORAL | Status: DC

## 2015-03-21 MED ORDER — DILTIAZEM HCL 100 MG IV SOLR
10.0000 mg/h | INTRAVENOUS | Status: DC
  Administered 2015-03-21 (×2): 10 mg/h via INTRAVENOUS
  Filled 2015-03-21 (×3): qty 100

## 2015-03-21 MED ORDER — RIVAROXABAN 20 MG PO TABS
20.0000 mg | ORAL_TABLET | Freq: Every day | ORAL | Status: DC
  Administered 2015-03-21 – 2015-03-24 (×4): 20 mg via ORAL
  Filled 2015-03-21 (×6): qty 1

## 2015-03-21 MED ORDER — FERROUS SULFATE 325 (65 FE) MG PO TABS
325.0000 mg | ORAL_TABLET | Freq: Three times a day (TID) | ORAL | Status: DC
  Administered 2015-03-21 – 2015-03-25 (×12): 325 mg via ORAL
  Filled 2015-03-21 (×16): qty 1

## 2015-03-21 MED ORDER — CARVEDILOL 12.5 MG PO TABS
12.5000 mg | ORAL_TABLET | Freq: Two times a day (BID) | ORAL | Status: DC
  Filled 2015-03-21 (×3): qty 1

## 2015-03-21 MED ORDER — TAMSULOSIN HCL 0.4 MG PO CAPS
0.4000 mg | ORAL_CAPSULE | Freq: Every day | ORAL | Status: DC
  Administered 2015-03-21 – 2015-03-24 (×4): 0.4 mg via ORAL
  Filled 2015-03-21 (×6): qty 1

## 2015-03-21 MED ORDER — TRAMADOL HCL 50 MG PO TABS
50.0000 mg | ORAL_TABLET | Freq: Three times a day (TID) | ORAL | Status: DC
  Administered 2015-03-21 – 2015-03-25 (×5): 50 mg via ORAL
  Filled 2015-03-21 (×7): qty 1

## 2015-03-21 MED ORDER — FUROSEMIDE 10 MG/ML IJ SOLN
80.0000 mg | Freq: Two times a day (BID) | INTRAMUSCULAR | Status: DC
  Administered 2015-03-21 – 2015-03-22 (×2): 80 mg via INTRAVENOUS
  Filled 2015-03-21 (×4): qty 8

## 2015-03-21 MED ORDER — SODIUM CHLORIDE 0.9 % IV SOLN
250.0000 mL | INTRAVENOUS | Status: DC
  Administered 2015-03-21: 250 mL via INTRAVENOUS

## 2015-03-21 MED ORDER — GABAPENTIN 100 MG PO CAPS
200.0000 mg | ORAL_CAPSULE | Freq: Every day | ORAL | Status: DC
  Administered 2015-03-21 – 2015-03-25 (×5): 200 mg via ORAL
  Filled 2015-03-21 (×5): qty 2

## 2015-03-21 MED ORDER — CARVEDILOL 12.5 MG PO TABS
12.5000 mg | ORAL_TABLET | Freq: Two times a day (BID) | ORAL | Status: DC
  Administered 2015-03-21 (×2): 12.5 mg via ORAL
  Filled 2015-03-21 (×5): qty 1

## 2015-03-21 MED ORDER — AMIODARONE HCL 200 MG PO TABS
200.0000 mg | ORAL_TABLET | Freq: Every day | ORAL | Status: DC
  Filled 2015-03-21: qty 1

## 2015-03-21 MED ORDER — GABAPENTIN 300 MG PO CAPS
300.0000 mg | ORAL_CAPSULE | Freq: Every day | ORAL | Status: DC
  Administered 2015-03-21 – 2015-03-24 (×4): 300 mg via ORAL
  Filled 2015-03-21 (×5): qty 1

## 2015-03-21 MED ORDER — FERROUS SULFATE 325 (65 FE) MG PO TABS
325.0000 mg | ORAL_TABLET | Freq: Three times a day (TID) | ORAL | Status: DC
  Filled 2015-03-21 (×4): qty 1

## 2015-03-21 MED ORDER — FINASTERIDE 5 MG PO TABS
5.0000 mg | ORAL_TABLET | Freq: Every morning | ORAL | Status: DC
  Administered 2015-03-21 – 2015-03-25 (×5): 5 mg via ORAL
  Filled 2015-03-21 (×5): qty 1

## 2015-03-21 MED ORDER — ONDANSETRON HCL 4 MG/2ML IJ SOLN: 4.0000 mg | Freq: Four times a day (QID) | INTRAMUSCULAR | Status: DC | PRN

## 2015-03-21 MED ORDER — ACETAMINOPHEN 325 MG PO TABS: 650.0000 mg | ORAL_TABLET | ORAL | Status: DC | PRN

## 2015-03-21 MED ORDER — POLYETHYL GLYCOL-PROPYL GLYCOL 0.4-0.3 % OP SOLN: 1.0000 [drp] | Freq: Two times a day (BID) | OPHTHALMIC | Status: DC

## 2015-03-21 NOTE — ED Notes (Signed)
Report attempted 

## 2015-03-21 NOTE — ED Notes (Signed)
Patient returned from xray.

## 2015-03-21 NOTE — Progress Notes (Signed)
Patient Name: Chad Brown Date of Encounter: 03/21/2015   Principal Problem:   Acute on chronic systolic CHF (congestive heart failure) Active Problems:   Nonischemic cardiomyopathy   Atrial fibrillation with RVR   Hypertension   Hyperlipidemia   Obesity    SUBJECTIVE  Orthopneic but otw stable.  Remains in afib - currently 90's to low 100's.  CURRENT MEDS . amiodarone  200 mg Oral Daily  . carvedilol  12.5 mg Oral BID WC  . dorzolamide-timolol  1 drop Right Eye BID  . ferrous sulfate  325 mg Oral TID WC  . finasteride  5 mg Oral q morning - 10a  . furosemide  40 mg Intravenous BID  . gabapentin  200 mg Oral Daily  . gabapentin  300 mg Oral QHS  . lipase/protease/amylase  36,000 Units Oral TID AC  . niacin  1,000 mg Oral QHS  . polyvinyl alcohol  1 drop Left Eye BID  . potassium chloride SA  20 mEq Oral q morning - 10a  . rivaroxaban  20 mg Oral Q supper  . tamsulosin  0.4 mg Oral QHS  . traMADol  50 mg Oral 3 times per day    OBJECTIVE  Filed Vitals:   03/21/15 0500 03/21/15 0515 03/21/15 0600 03/21/15 0644  BP: 140/87 134/99 143/78 138/99  Pulse: 93 101 99 98  Temp:    98.2 F (36.8 C)  TempSrc:    Oral  Resp: 15 16 15 16   Height:    5\' 6"  (1.676 m)  Weight:    205 lb 8 oz (93.214 kg)  SpO2: 95% 96% 95% 93%    Intake/Output Summary (Last 24 hours) at 03/21/15 0851 Last data filed at 03/21/15 0819  Gross per 24 hour  Intake 986.83 ml  Output      0 ml  Net 986.83 ml   Filed Weights   03/21/15 0059 03/21/15 0644  Weight: 204 lb (92.534 kg) 205 lb 8 oz (93.214 kg)    PHYSICAL EXAM  General: Pleasant, NAD. Neuro: Alert and oriented X 3. Moves all extremities spontaneously. Psych: Normal affect. HEENT:  Normal  Neck: Supple without bruits.  JVD to jaw. Lungs:  Resp regular and unlabored, CTA. Heart: RRR no s3, s4, or murmurs. Abdomen:  Firm, distended, BS + x 4.  Extremities: No clubbing, cyanosis or edema. DP/PT/Radials 2+ and equal  bilaterally.  Accessory Clinical Findings  CBC  Recent Labs  03/21/15 0110  WBC 6.8  NEUTROABS 4.9  HGB 11.7*  HCT 37.6*  MCV 88.7  PLT [id number]*   Basic Metabolic Panel  Recent Labs  03/21/15 0110  NA 141  K 4.0  CL 102  CO2 30  GLUCOSE 138*  BUN 13  CREATININE 1.38*  CALCIUM 8.8*   TELE  Afib, intermittent rbbb, V pacing on demand.  ECG  V-paced, 97, underlying afib.  Radiology/Studies  Dg Chest 2 View  03/21/2015   CLINICAL DATA:  Acute onset of shortness of breath and throat tightness. Dizziness. Initial encounter.  EXAM: CHEST  2 VIEW  COMPARISON:  Chest radiograph performed 03/09/2015  FINDINGS: The lungs are well-aerated. Vascular congestion is noted. Mild bibasilar opacities could reflect minimal interstitial edema. No pleural effusion or pneumothorax is seen.  The heart is borderline normal in size. A pacemaker/AICD is noted at the left chest wall, with leads ending at the right atrium, right ventricle and coronary sinus. No acute osseous abnormalities are seen.  IMPRESSION: Vascular congestion noted. Mild bibasilar  opacities could reflect minimal interstitial edema.   Electronically Signed   By: Garald Balding M.D.   On: 03/21/2015 03:00   ASSESSMENT AND PLAN  1.  Acute on chronic systolic chf/NICM:  Pt presented overnight with 7 lbs wt gain since June 7th and progressive dyspnea in the setting of rapid afib.  Dilt gtt started overnight.  Rates currently 90's to low 100's in afib.  Abd is firm.  No LEE.  Lungs clear.  Lasix 40 IV bid ordered.  Will increase to 80 BID.  Increase amio to 200 BID.  Will try to wean dilt as soon as rate reasonable.  Cont bb.  Home dose of ARB not ordered in setting of mild renal insuff.  Will plan to resume pending response to diuresis.  2.  PAF with RVR:  Interrogate MDT device today (rep notified).  He says that he's been feeling lightheaded over past week. No palpitations.  He has not missed any xarelto since 6/9 (was on hold for  procedure - left hospital in sinus rhythm).  Increase amio to 200 bid.  Will likely require dccv tomorrow.  Cont bb/xarelto.  **Addendum - device interrogated.  He has been in afib 100% of the time since 6/16 when he was seen in device clinic.  He was also in afib that day and per that interrogation note, he had 64.4% mode switching.**  3.  Essential HTN:  BP moderately elevated.  Follow with diuresis.  4.  HL:  No LDL on file.  Prior intolerance to statins.  Will check lipids.  LFT's wnl 5/31.  5.  Morbid Obesity:  Will benefit from regular activity following discharge/recovery.  Signed, Murray Hodgkins NP

## 2015-03-21 NOTE — Progress Notes (Signed)
   TEE and DCCV scheduled for Wed 6/22 @ 13:00 with Dr. Marlou Porch.  Murray Hodgkins, NP 03/21/2015, 4:58 PM

## 2015-03-21 NOTE — ED Notes (Signed)
Patient transported to X-ray 

## 2015-03-21 NOTE — H&P (Signed)
Chad Brown is an 74 y.o. male.    Chief Complaint: shortness of breath Primary Cardiologist: Allred/Nishan HPI: [name] is a 74 yo man with PMH of chronic systolic heart failure, paroxysmal atrial fibrillation, melanoma who presents with worsening shortness of breath and found to have atrial fibrillation with RVR. He recently had a BIV-ICD placed 03/08/15 for ischemic cardiomyopathy with underlying LBBB and reduced ejection fraction. He presents today with one week of weight gain (7 lbs), decreased responsiveness to lasix and progressive shortness of breath over the last 24 hours. He has been compliant with his medications, no significant travel or dietary changes. In the ER he was started on diltiazem gtt and HR improved to high 90s. He denies recent illness. He really just has noticed weight gain, shortness of breath over 24 hours and decreased UOP to lasix. No fevers/chills/nausea/vomiting/diarrhea.   Past Medical History  Diagnosis Date  . Hyperlipidemia   . Hypertension   . Systolic CHF     a. New dx 12/2012 ?NICM, may be r/t afib. b. Nuc 03/2013 - normal.  . PAF (paroxysmal atrial fibrillation)     a. Dx 12/2012, s/p TEE/DCCV 01/26/13. b. On Xarelto.  . Sinus bradycardia 10/02/2014  . Anemia     iron deficient  . Cholelithiasis   . Atherosclerosis   . Colon polyp, hyperplastic 5/16    removed precancerous lesions  . AICD (automatic cardioverter/defibrillator) present   . CHF (congestive heart failure)   . Myocardial infarction 1998  . Arthritis     "about all my joints; hands, knees, back" (03/08/2015)  . Urinary hesitancy due to benign prostatic hypertrophy   . Melanoma of eye 2000's    "right; it's never been biopsied"  . Melanoma of lower leg 2015    "left; right at my knee"    Past Surgical History  Procedure Laterality Date  . Back surgery    . Total hip arthroplasty Right 06/2007  . Cataract extraction Right ~ 2006  . Glaucoma surgery Right ~ 2006    "put 3  stents in to drain fluid" (03/08/2015)  . Refractive surgery Right ~ 2006 X 2    "twice; both done at [facility]" (03/08/2015  . Surgery scrotal / testicular Right 1990's  . Incision and drainage abscess posterior cervicalspine  05/2012  . Cardiac catheterization  1998  . Tee without cardioversion N/A 01/26/2013    Procedure: TRANSESOPHAGEAL ECHOCARDIOGRAM (TEE);  Surgeon: Lelon Perla, MD;  Location: [place];  Service: Cardiovascular;  Laterality: N/A;  [name] anes. /   . Cardioversion N/A 01/26/2013    Procedure: CARDIOVERSION;  Surgeon: Lelon Perla, MD;  Location: [facility] ENDOSCOPY;  Service: Cardiovascular;  Laterality: N/A;  . Tee without cardioversion N/A 10/05/2014    Procedure: TRANSESOPHAGEAL ECHOCARDIOGRAM (TEE)  with cardioversion;  Surgeon: Thayer Headings, MD;  Location: [facility] ENDOSCOPY;  Service: Cardiovascular;  Laterality: N/A;  12:52 synched cardioversion at 120 joules,...afib to SR...12 lead EKG ordered.Marland KitchenMarland KitchenCardiozem d/c'ed per MD verbal order at [place]  . Melanoma excision Left 2015    "lower leg; right at my knee"  . Colonoscopy with propofol N/A 02/10/2015    Procedure: COLONOSCOPY WITH PROPOFOL;  Surgeon: Gatha Mayer, MD;  Location: [facility] ENDOSCOPY;  Service: Endoscopy;  Laterality: N/A;  . Bi-ventricular implantable cardioverter defibrillator  (crt-d)  03/08/2015  . Joint replacement    . Thoracic spine surgery  03/2000    "ground calcium deposits from upper thoracic" (01/26/2013)  . Ep implantable device N/A  03/08/2015    Procedure: BiV ICD Insertion CRT-D;  Surgeon: Thompson Grayer, MD;  Location: [place] CV LAB;  Service: Cardiovascular;  Laterality: N/A;    Family History  Problem Relation Age of Onset  . Kidney disease Mother   . Heart disease Mother     MI, open heart  . Diabetes Mother     dialysis  . Leukemia Father   . Colon cancer Paternal Uncle   . Lung cancer Paternal Uncle     x 2  . Prostate cancer Paternal Uncle   . Diabetes Maternal Grandmother   . Heart attack  Maternal Uncle   . Diabetes Maternal Aunt     x 3  . Diabetes Maternal Uncle    Social History:  reports that he quit smoking about 14 years ago. His smoking use included Cigarettes. He has a 144 pack-year smoking history. He has never used smokeless tobacco. He reports that he does not drink alcohol or use illicit drugs.  Allergies:  Allergies  Allergen Reactions  . Pravastatin Sodium Other (See Comments)    Joint and muscle pain  . Zithromax [Azithromycin] Diarrhea     (Not in a hospital admission)  Results for orders placed or performed during the hospital encounter of 03/21/15 (from the past 48 hour(s))  Basic metabolic panel     Status: Abnormal   Collection Time: 03/21/15  1:10 AM  Result Value Ref Range   Sodium 141 135 - 145 mmol/L   Potassium 4.0 3.5 - 5.1 mmol/L   Chloride 102 101 - 111 mmol/L   CO2 30 22 - 32 mmol/L   Glucose, Bld 138 (H) 65 - 99 mg/dL   BUN 13 6 - 20 mg/dL   Creatinine, Ser 1.38 (H) 0.61 - 1.24 mg/dL   Calcium 8.8 (L) 8.9 - 10.3 mg/dL   GFR calc non Af Amer 49 (L) >60 mL/min   GFR calc Af Amer 57 (L) >60 mL/min    Comment: (NOTE) The eGFR has been calculated using the CKD EPI equation. This calculation has not been validated in all clinical situations. eGFR's persistently <60 mL/min signify possible Chronic Kidney Disease.    Anion gap 9 5 - 15  CBC with Differential     Status: Abnormal   Collection Time: 03/21/15  1:10 AM  Result Value Ref Range   WBC 6.8 4.0 - 10.5 K/uL   RBC 4.24 4.22 - 5.81 MIL/uL   Hemoglobin 11.7 (L) 13.0 - 17.0 g/dL   HCT 37.6 (L) 39.0 - 52.0 %   MCV 88.7 78.0 - 100.0 fL   MCH 27.6 26.0 - 34.0 pg   MCHC 31.1 30.0 - 36.0 g/dL   RDW 17.6 (H) 11.5 - 15.5 %   Platelets 140 (L) 150 - 400 K/uL   Neutrophils Relative % 72 43 - 77 %   Neutro Abs 4.9 1.7 - 7.7 K/uL   Lymphocytes Relative 18 12 - 46 %   Lymphs Abs 1.2 0.7 - 4.0 K/uL   Monocytes Relative 7 3 - 12 %   Monocytes Absolute 0.5 0.1 - 1.0 K/uL   Eosinophils  Relative 2 0 - 5 %   Eosinophils Absolute 0.1 0.0 - 0.7 K/uL   Basophils Relative 1 0 - 1 %   Basophils Absolute 0.0 0.0 - 0.1 K/uL  Brain natriuretic peptide     Status: Abnormal   Collection Time: 03/21/15  1:10 AM  Result Value Ref Range   B Natriuretic Peptide 968.4 (H)  0.0 - 100.0 pg/mL  I-stat troponin, ED     Status: None   Collection Time: 03/21/15  1:47 AM  Result Value Ref Range   Troponin i, poc 0.01 0.00 - 0.08 ng/mL   Comment 3            Comment: Due to the release kinetics of cTnI, a negative result within the first hours of the onset of symptoms does not rule out myocardial infarction with certainty. If myocardial infarction is still suspected, repeat the test at appropriate intervals.    Dg Chest 2 View  03/21/2015   CLINICAL DATA:  Acute onset of shortness of breath and throat tightness. Dizziness. Initial encounter.  EXAM: CHEST  2 VIEW  COMPARISON:  Chest radiograph performed 03/09/2015  FINDINGS: The lungs are well-aerated. Vascular congestion is noted. Mild bibasilar opacities could reflect minimal interstitial edema. No pleural effusion or pneumothorax is seen.  The heart is borderline normal in size. A pacemaker/AICD is noted at the left chest wall, with leads ending at the right atrium, right ventricle and coronary sinus. No acute osseous abnormalities are seen.  IMPRESSION: Vascular congestion noted. Mild bibasilar opacities could reflect minimal interstitial edema.   Electronically Signed   By: Garald Balding M.D.   On: 03/21/2015 03:00    Review of Systems  Constitutional: Positive for malaise/fatigue. Negative for fever, chills and diaphoresis.  HENT: Negative for ear discharge.   Eyes: Negative for double vision and photophobia.  Respiratory: Positive for shortness of breath. Negative for cough.   Cardiovascular: Positive for palpitations. Negative for orthopnea.       Abdominal fullness  Gastrointestinal: Negative for nausea and vomiting.    Genitourinary: Negative for dysuria and frequency.  Musculoskeletal: Negative for myalgias and neck pain.  Skin: Negative for rash.  Neurological: Positive for dizziness. Negative for tingling, tremors and headaches.  Endo/Heme/Allergies: Negative for polydipsia. Bruises/bleeds easily.  Psychiatric/Behavioral: Negative for depression, suicidal ideas and substance abuse.    Blood pressure 146/89, pulse 104, temperature 97.2 F (36.2 C), resp. rate 16, height 5' 6"  (1.676 m), weight 92.534 kg (204 lb), SpO2 96 %. Physical Exam  Nursing note and vitals reviewed. Constitutional: He is oriented to person, place, and time. He appears well-developed and well-nourished. No distress.  HENT:  Head: Normocephalic and atraumatic.  Nose: Nose normal.  Mouth/Throat: Oropharynx is clear and moist. No oropharyngeal exudate.  Eyes: Conjunctivae and EOM are normal. Pupils are equal, round, and reactive to light. No scleral icterus.  Neck: Normal range of motion. Neck supple. No tracheal deviation present.  JVP to mandible  Cardiovascular: Normal heart sounds and intact distal pulses.  Exam reveals no gallop.   No murmur heard. Irregularly irregular  Respiratory: Effort normal. No respiratory distress. He has no wheezes. He has rales.  Scattered rales at bases  GI: Bowel sounds are normal. He exhibits no distension. There is no tenderness.  Abdomen full  Musculoskeletal: Normal range of motion. He exhibits edema.  Trace edema bilaterally  Neurological: He is alert and oriented to person, place, and time. No cranial nerve deficit. Coordination normal.  Skin: Skin is warm and dry. No rash noted. He is not diaphoretic. No erythema.  Lukewarm extremities  Psychiatric: He has a normal mood and affect. His behavior is normal. Thought content normal.   left chest pocket site c/d/i Labs reviewed; na 141, K 4.0, bun/cr 13/1.4, h/h 11.7/37.6, plt 140, Trop 0.01, wbc 6.8 Chest x-ray: mild vascular fullness -  ? Change since  June EKG: multiple morphology QRS - paced beats, RBBB, LBBB; suspect competing rhythm from device and underlying atrial fibrillation 1/16 Echo: moderate TR, moderate to severely reduced EF  BNP 900s Assessment/Plan Mr. Losito is a 74 yo man with chronic systolic heart failure and paroxysmal atrial fibrillation on xarelto who presents with 24 hours of shortness of breath, 7 lb weight gain and  with recent BIV-ICD 03/08/15. CHADS2vASC = 4. He was found to have atrial fibrillation with RVR. It is hard to know if the atrial fibrillation led to some volume accumulation or if heart failure exacerbated his atrial fibrillation. Agree with gentle rate control, continued anticoagulation with xarelto, IV diuresis, interrogate device in AM and consider DCCV when more euvolemic.   Problem List  Dyspnea Acute on chronic systolic heart failure Atrial fibrillation with RVR  Mild acute renal failure  Recent BIV-ICD Plan 1. Admit to telemetry, trend cardiac biomarkers 2. Gentle IV diuresis with 40 mg IV bid written for  3. Continue home coreg 12.5 mg bid, holding losartan 100 mg given mild acute renal failure (cr 1.4 from 0.9), continue amiodarone 200 mg daily  4. Interrogate MDT device this AM   5. Continue xarelto 20 mg daily  6. Gradually titrate off diltiazem gtt  7. Check urinalysis  Davinia Riccardi 03/21/2015, 4:54 AM

## 2015-03-21 NOTE — ED Notes (Signed)
Pt c/o sob SINCE YESTERDAY  TODAY HE HAS BEEN MOIRE SOB AND HIS HEART HAS BEEN RACING.  HE HAS HAD A PACEMAKER  PLACED LAST WEEK AND SINCE THEN  HIS HEART RATE HAS BEEN FASTER.  HE ALSO HAS SOME DISCOMFORT IN HIS ANTERIOR NECK.  NOI EXTREMITY SWELLING BUT HE FEELS LIKE HE HAS BEEN GAINING WEIGHT IN THE OAST WEEK AND HIS LASIX HAS NOT HELPED AS MUCH AS USUAL

## 2015-03-21 NOTE — Progress Notes (Signed)
Patient transported to 3east by stretcher accompanied by family. No complaints of pain.  Alert and oriented. Placed on box 3e13.

## 2015-03-21 NOTE — ED Provider Notes (Signed)
CSN: [id number]     Arrival date & time 03/21/15  0054 History  This chart was scribed for Debby Freiberg, MD by Randa Evens, ED Scribe. This patient was seen in room A03C/A03C and the patient's care was started at 1:01 AM.      Chief Complaint  Patient presents with  . Shortness of Breath   Patient is a 74 y.o. male presenting with shortness of breath. The history is provided by the patient. No language interpreter was used.  Shortness of Breath Severity:  Moderate Duration:  1 day Timing:  Intermittent Chronicity:  New Relieved by:  None tried Worsened by:  Nothing tried Ineffective treatments:  None tried  HPI Comments: Chad Brown is a 74 y.o. male with extensive medical H listed below who presents to the Emergency Department complaining of Sob onset tonight. Pt is also complaining of heart palpitations described as tachycardia for the past 5-6 days. Pt states that over the past 3 days his heart rate has not dropped below 84 beats per minutes. Pt states that tonight his highest HR was 122 PTA. Pt report Hx of recent placement of a defibrillator and pacemaker. Pt states that he has been complaint with taking his medications. Pt denies CP, n/v/d, cough, or abdominal pain.   Past Medical History  Diagnosis Date  . Hyperlipidemia   . Hypertension   . Systolic CHF     a. New dx 12/2012 ?NICM, may be r/t afib. b. Nuc 03/2013 - normal.  . PAF (paroxysmal atrial fibrillation)     a. Dx 12/2012, s/p TEE/DCCV 01/26/13. b. On Xarelto.  . Sinus bradycardia 10/02/2014  . Anemia     iron deficient  . Cholelithiasis   . Atherosclerosis   . Colon polyp, hyperplastic 5/16    removed precancerous lesions  . AICD (automatic cardioverter/defibrillator) present   . CHF (congestive heart failure)   . Myocardial infarction 1998  . Arthritis     "about all my joints; hands, knees, back" (03/08/2015)  . Urinary hesitancy due to benign prostatic hypertrophy   . Melanoma of eye 2000's    "right;  it's never been biopsied"  . Melanoma of lower leg 2015    "left; right at my knee"   Past Surgical History  Procedure Laterality Date  . Back surgery    . Total hip arthroplasty Right 06/2007  . Cataract extraction Right ~ 2006  . Glaucoma surgery Right ~ 2006    "put 3 stents in to drain fluid" (03/08/2015)  . Refractive surgery Right ~ 2006 X 2    "twice; both done at [place]" (03/08/2015  . Surgery scrotal / testicular Right 1990's  . Incision and drainage abscess posterior cervicalspine  05/2012  . Cardiac catheterization  1998  . Tee without cardioversion N/A 01/26/2013    Procedure: TRANSESOPHAGEAL ECHOCARDIOGRAM (TEE);  Surgeon: Lelon Perla, MD;  Location: [place];  Service: Cardiovascular;  Laterality: N/A;  [name] anes. /   . Cardioversion N/A 01/26/2013    Procedure: CARDIOVERSION;  Surgeon: Lelon Perla, MD;  Location: [facility] ENDOSCOPY;  Service: Cardiovascular;  Laterality: N/A;  . Tee without cardioversion N/A 10/05/2014    Procedure: TRANSESOPHAGEAL ECHOCARDIOGRAM (TEE)  with cardioversion;  Surgeon: Thayer Headings, MD;  Location: [facility] ENDOSCOPY;  Service: Cardiovascular;  Laterality: N/A;  12:52 synched cardioversion at 120 joules,...afib to SR...12 lead EKG ordered.Marland KitchenMarland KitchenCardiozem d/c'ed per MD verbal order at [place]  . Melanoma excision Left 2015    "lower leg; right at my  knee"  . Colonoscopy with propofol N/A 02/10/2015    Procedure: COLONOSCOPY WITH PROPOFOL;  Surgeon: Gatha Mayer, MD;  Location: [facility] ENDOSCOPY;  Service: Endoscopy;  Laterality: N/A;  . Bi-ventricular implantable cardioverter defibrillator  (crt-d)  03/08/2015  . Joint replacement    . Thoracic spine surgery  03/2000    "ground calcium deposits from upper thoracic" (01/26/2013)  . Ep implantable device N/A 03/08/2015    Procedure: BiV ICD Insertion CRT-D;  Surgeon: Thompson Grayer, MD;  Location: [place] CV LAB;  Service: Cardiovascular;  Laterality: N/A;   Family History  Problem Relation Age of Onset  . Kidney  disease Mother   . Heart disease Mother     MI, open heart  . Diabetes Mother     dialysis  . Leukemia Father   . Colon cancer Paternal Uncle   . Lung cancer Paternal Uncle     x 2  . Prostate cancer Paternal Uncle   . Diabetes Maternal Grandmother   . Heart attack Maternal Uncle   . Diabetes Maternal Aunt     x 3  . Diabetes Maternal Uncle    History  Substance Use Topics  . Smoking status: Former Smoker -- 3.00 packs/day for 48 years    Types: Cigarettes    Quit date: 09/28/2000  . Smokeless tobacco: Never Used  . Alcohol Use: No    Review of Systems  Respiratory: Positive for shortness of breath.   All other systems reviewed and are negative.     Allergies  Pravastatin sodium and Zithromax  Home Medications   Prior to Admission medications   Medication Sig Start Date End Date Taking? Authorizing Provider  acetaminophen (TYLENOL) 500 MG tablet Take 1,000 mg by mouth every 4 (four) hours as needed for moderate pain or headache.   Yes Historical Provider, MD  amiodarone (PACERONE) 200 MG tablet Take 200 mg by mouth daily.  02/04/15  Yes Historical Provider, MD  carvedilol (COREG) 12.5 MG tablet Take 1 tablet (12.5 mg total) by mouth 2 (two) times daily with a meal. 10/08/14  Yes Brett Canales, PA-C  dorzolamide-timolol (COSOPT) 22.3-6.8 MG/ML ophthalmic solution Place 1 drop into the right eye 2 (two) times daily.   Yes Historical Provider, MD  Ferrous Sulfate (IRON) 325 (65 FE) MG TABS Take 325 mg by mouth 3 (three) times daily.   Yes Historical Provider, MD  finasteride (PROSCAR) 5 MG tablet Take 5 mg by mouth every morning.  02/13/13  Yes Historical Provider, MD  furosemide (LASIX) 20 MG tablet Take 1 tablet by mouth once a day Patient taking differently: Take 1 tablet by mouth every morning. 07/29/14  Yes Josue Hector, MD  gabapentin (NEURONTIN) 100 MG capsule Take 200-300 mg by mouth See admin instructions. Take 2 capsules by mouth in the morning and 3 capsules by  mouth at night 02/20/12  Yes Historical Provider, MD  lipase/protease/amylase (CREON) 36000 UNITS CPEP capsule Take 1 capsule (36,000 Units total) by mouth 3 (three) times daily before meals. And with snacks Patient taking differently: Take 36,000 Units by mouth as needed. And with snacks 02/10/15  Yes Gatha Mayer, MD  losartan (COZAAR) 100 MG tablet TAKE 1 TABLET DAILY 07/09/14  Yes Josue Hector, MD  NIASPAN 500 MG CR tablet Take 1,000 mg by mouth at bedtime.  05/09/12  Yes Historical Provider, MD  Polyethyl Glycol-Propyl Glycol (SYSTANE ULTRA OP) Place 1 drop into the left eye 2 (two) times daily.  Yes Historical Provider, MD  potassium chloride SA (K-DUR,KLOR-CON) 20 MEQ tablet Take 20 mEq by mouth every morning.  01/27/13  Yes Dayna N Dunn, PA-C  rivaroxaban (XARELTO) 20 MG TABS tablet Take 1 tablet (20 mg total) by mouth daily with supper. Restart tomorrow 02/11/2015 02/10/15  Yes Gatha Mayer, MD  tamsulosin (FLOMAX) 0.4 MG CAPS Take 0.4 mg by mouth at bedtime.    Yes Historical Provider, MD  traMADol (ULTRAM) 50 MG tablet Take 1 tablet (50 mg total) by mouth every 8 (eight) hours. 02/07/15  Yes Josue Hector, MD   BP 146/89 mmHg  Pulse 104  Temp(Src) 97.2 F (36.2 C)  Resp 16  Ht 5\' 6"  (1.676 m)  Wt 204 lb (92.534 kg)  BMI 32.94 kg/m2  SpO2 96%   Physical Exam  Constitutional: He is oriented to person, place, and time. He appears well-developed and well-nourished.  HENT:  Head: Normocephalic and atraumatic.  Eyes: Conjunctivae and EOM are normal.  Neck: Normal range of motion. Neck supple.  Cardiovascular: Normal heart sounds.  An irregularly irregular rhythm present. Tachycardia present.   Pulmonary/Chest: Effort normal and breath sounds normal. No respiratory distress.  Abdominal: He exhibits no distension. There is no tenderness. There is no rebound and no guarding.  Musculoskeletal: Normal range of motion.  Neurological: He is alert and oriented to person, place, and time.   Skin: Skin is warm and dry.  Vitals reviewed.   ED Course  Procedures (including critical care time) DIAGNOSTIC STUDIES: Oxygen Saturation is 97% on RA, normal by my interpretation.    COORDINATION OF CARE:    Labs Review Labs Reviewed  BASIC METABOLIC PANEL - Abnormal; Notable for the following:    Glucose, Bld 138 (*)    Creatinine, Ser 1.38 (*)    Calcium 8.8 (*)    GFR calc non Af Amer 49 (*)    GFR calc Af Amer 57 (*)    All other components within normal limits  CBC WITH DIFFERENTIAL/PLATELET - Abnormal; Notable for the following:    Hemoglobin 11.7 (*)    HCT 37.6 (*)    RDW 17.6 (*)    Platelets 140 (*)    All other components within normal limits  BRAIN NATRIURETIC PEPTIDE - Abnormal; Notable for the following:    B Natriuretic Peptide 968.4 (*)    All other components within normal limits  I-STAT TROPOININ, ED    Imaging Review Dg Chest 2 View  03/21/2015   CLINICAL DATA:  Acute onset of shortness of breath and throat tightness. Dizziness. Initial encounter.  EXAM: CHEST  2 VIEW  COMPARISON:  Chest radiograph performed 03/09/2015  FINDINGS: The lungs are well-aerated. Vascular congestion is noted. Mild bibasilar opacities could reflect minimal interstitial edema. No pleural effusion or pneumothorax is seen.  The heart is borderline normal in size. A pacemaker/AICD is noted at the left chest wall, with leads ending at the right atrium, right ventricle and coronary sinus. No acute osseous abnormalities are seen.  IMPRESSION: Vascular congestion noted. Mild bibasilar opacities could reflect minimal interstitial edema.   Electronically Signed   By: Garald Balding M.D.   On: 03/21/2015 03:00     EKG Interpretation   Date/Time:  Monday March 21 2015 02:20:18 EDT Ventricular Rate:  97 PR Interval:  132 QRS Duration: 181 QT Interval:  439 QTC Calculation: 558 R Axis:   -120 Text Interpretation:  Age not entered, assumed to be  74 years old for  purpose  of ECG  interpretation Sinus rhythm Paired ventricular premature  complexes Right bundle branch block Inferolateral infarct, acute Anterior  infarct, old rate has decreased since last tracing Confirmed by Debby Freiberg [phone]) on 03/21/2015 2:48:54 AM      MDM   Final diagnoses:  Atrial fibrillation with RVR      74 y.o. male with pertinent PMH of afib, recent pacer placement presents with recurrent dyspnea, palpitations.  Pt in afib with rvr again today.  No other signs of infection or other health issues.  Physical exam as above.  Given dilt and placed on dilt drip, admitted to cardiology.    I have reviewed all laboratory and imaging studies if ordered as above  1. Atrial fibrillation with RVR           Debby Freiberg, MD 03/21/15 [phone]

## 2015-03-22 LAB — BASIC METABOLIC PANEL
Anion gap: 7 (ref 5–15)
BUN: 11 mg/dL (ref 6–20)
CALCIUM: 8.1 mg/dL — AB (ref 8.9–10.3)
CO2: 34 mmol/L — ABNORMAL HIGH (ref 22–32)
Chloride: 101 mmol/L (ref 101–111)
Creatinine, Ser: 1.2 mg/dL (ref 0.61–1.24)
GFR calc Af Amer: >60 mL/min (ref >60)
GFR calc non Af Amer: 58 mL/min — ABNORMAL LOW (ref >60)
GLUCOSE: 109 mg/dL — AB (ref 65–99)
Potassium: 3.4 mmol/L — ABNORMAL LOW (ref 3.5–5.1)
SODIUM: 142 mmol/L (ref 135–145)

## 2015-03-22 LAB — CBC
HCT: 33.7 % — ABNORMAL LOW (ref 39.0–52.0)
Hemoglobin: 10.8 g/dL — ABNORMAL LOW (ref 13.0–17.0)
MCH: 27.8 pg (ref 26.0–34.0)
MCHC: 32 g/dL (ref 30.0–36.0)
MCV: 86.9 fL (ref 78.0–100.0)
Platelets: 134 10*3/uL — ABNORMAL LOW (ref 150–400)
RBC: 3.88 MIL/uL — ABNORMAL LOW (ref 4.22–5.81)
RDW: 17.8 % — AB (ref 11.5–15.5)
WBC: 6.7 10*3/uL (ref 4.0–10.5)

## 2015-03-22 LAB — LIPID PANEL
CHOL/HDL RATIO: 7.7 ratio
Cholesterol: 200 mg/dL (ref 0–200)
HDL: 26 mg/dL — AB (ref >40)
LDL Cholesterol: 153 mg/dL — ABNORMAL HIGH (ref 0–99)
Triglycerides: 105 mg/dL (ref <150)
VLDL: 21 mg/dL (ref 0–40)

## 2015-03-22 MED ORDER — POTASSIUM CHLORIDE CRYS ER 20 MEQ PO TBCR
20.0000 meq | EXTENDED_RELEASE_TABLET | Freq: Two times a day (BID) | ORAL | Status: DC
  Administered 2015-03-22 – 2015-03-25 (×6): 20 meq via ORAL
  Filled 2015-03-22 (×8): qty 1

## 2015-03-22 MED ORDER — CARVEDILOL 12.5 MG PO TABS
12.5000 mg | ORAL_TABLET | Freq: Two times a day (BID) | ORAL | Status: DC
  Filled 2015-03-22 (×2): qty 1

## 2015-03-22 MED ORDER — CARVEDILOL 6.25 MG PO TABS
18.7500 mg | ORAL_TABLET | Freq: Two times a day (BID) | ORAL | Status: DC
  Filled 2015-03-22 (×2): qty 1

## 2015-03-22 MED ORDER — CARVEDILOL 6.25 MG PO TABS
18.7500 mg | ORAL_TABLET | Freq: Two times a day (BID) | ORAL | Status: DC
  Administered 2015-03-22 – 2015-03-25 (×6): 18.75 mg via ORAL
  Filled 2015-03-22 (×8): qty 1

## 2015-03-22 MED ORDER — FUROSEMIDE 10 MG/ML IJ SOLN
40.0000 mg | Freq: Two times a day (BID) | INTRAMUSCULAR | Status: DC
  Administered 2015-03-22 – 2015-03-23 (×2): 40 mg via INTRAVENOUS
  Filled 2015-03-22 (×2): qty 4

## 2015-03-22 NOTE — Progress Notes (Signed)
UR completed 

## 2015-03-22 NOTE — Progress Notes (Signed)
Patient Name: Chad Brown Date of Encounter: 03/22/2015   Principal Problem:   Acute on chronic systolic CHF (congestive heart failure) Active Problems:   Nonischemic cardiomyopathy   Atrial fibrillation with RVR   Hypertension   Hyperlipidemia   Obesity    SUBJECTIVE  Feeling much better.  Breathing improved.  Abd less distended.  Remains in afib, V pacing - rates generally 80's to 90's.  CURRENT MEDS . amiodarone  200 mg Oral BID  . carvedilol  12.5 mg Oral BID WC  . dorzolamide-timolol  1 drop Right Eye BID  . ferrous sulfate  325 mg Oral TID WC  . finasteride  5 mg Oral q morning - 10a  . furosemide  80 mg Intravenous BID  . gabapentin  200 mg Oral Daily  . gabapentin  300 mg Oral QHS  . lipase/protease/amylase  36,000 Units Oral TID AC  . niacin  1,000 mg Oral QHS  . polyvinyl alcohol  1 drop Left Eye BID  . potassium chloride SA  20 mEq Oral q morning - 10a  . rivaroxaban  20 mg Oral Q supper  . sodium chloride  3 mL Intravenous Q12H  . tamsulosin  0.4 mg Oral QHS  . traMADol  50 mg Oral 3 times per day    OBJECTIVE  Filed Vitals:   03/21/15 1529 03/21/15 1810 03/21/15 2013 03/22/15 0556  BP: 122/76 115/78 119/76 116/73  Pulse: 85 85 87 81  Temp: 97.5 F (36.4 C) 97.6 F (36.4 C) 98.2 F (36.8 C) 97.8 F (36.6 C)  TempSrc: Oral Oral Oral Oral  Resp: 18 18 18 18   Height:      Weight:    200 lb (90.719 kg)  SpO2: 95% 97% 98% 98%    Intake/Output Summary (Last 24 hours) at 03/22/15 0757 Last data filed at 03/22/15 0600  Gross per 24 hour  Intake   5495 ml  Output   4100 ml  Net   1395 ml   Filed Weights   03/21/15 0059 03/21/15 0644 03/22/15 0556  Weight: 204 lb (92.534 kg) 205 lb 8 oz (93.214 kg) 200 lb (90.719 kg)    PHYSICAL EXAM  General: Pleasant, NAD. Neuro: Alert and oriented X 3. Moves all extremities spontaneously. Psych: Normal affect. HEENT:  Normal  Neck: Supple without bruits.  JVP ~ 8 cm. Lungs:  Resp regular and  unlabored, CTA. Heart: RRR no s3, s4, or murmurs. Abdomen: Softer than yesterday, non-tender, BS + x 4.  Extremities: No clubbing, cyanosis or edema. DP/PT/Radials 2+ and equal bilaterally.  Accessory Clinical Findings  CBC  Recent Labs  03/21/15 0110 03/22/15 0536  WBC 6.8 6.7  NEUTROABS 4.9  --   HGB 11.7* 10.8*  HCT 37.6* 33.7*  MCV 88.7 86.9  PLT 140* [id number]*   Basic Metabolic Panel  Recent Labs  03/21/15 0110 03/22/15 0536  NA 141 142  K 4.0 3.4*  CL 102 101  CO2 30 34*  GLUCOSE 138* 109*  BUN 13 11  CREATININE 1.38* 1.20  CALCIUM 8.8* 8.1*   Fasting Lipid Panel  Recent Labs  03/22/15 0536  CHOL 200  HDL 26*  LDLCALC 153*  TRIG 105  CHOLHDL 7.7   TELE  V paced, 80's to 90's, underlying afib.  ASSESSMENT AND PLAN  1. Acute on chronic systolic chf/NICM: Pt presented overnight with 7 lbs wt gain since June 7th and progressive dyspnea in the setting of rapid afib. Wt down 5 lbs but I/O  recorded as +2.3 L - clearly inaccurate.  Feeling much better - belly less full.  Prior dry wt ~ 198-199.  Cont IV lasix today.  Cont bb and titrate.  D/c IV dilt.  Home dose of ARB on hold in setting of mild renal insuff on admission.  Creat better this AM.  2. PAF with RVR: Device interrogated yesterday and revealed afib 100% of the time since 6/16 when he was seen in device clinic. He was also in afib that day and per that interrogation note, he had 64.4% mode switching.  MDT rep tried to review history prior to 6/16 visit in order to determine exactly when pt went into afib, however history had been cleared.  Thus it's not known exactly when he went into afib.  He was in sinus rhythm on the morning of d/c (6/8) but was off xarelto until the evening of 6/9.  He remains in afib this AM.  Amio increased to 200 bid yesterday.  Wean IV dilt.  Titrate bb.  Plan TEE/DCCV in AM.  3. Essential HTN: BP stable.  4. HL: Prior intolerance to statins. LDL 153.  LFT's wnl 5/31.   Consider zetia.  5. Morbid Obesity: Will benefit from regular activity following discharge/recovery.  6.  Normocytic Anemia:  H/H down slightly since admission.  F/u in AM.  Signed, Murray Hodgkins NP

## 2015-03-23 ENCOUNTER — Inpatient Hospital Stay (HOSPITAL_COMMUNITY): Payer: Medicare Other | Admitting: Anesthesiology

## 2015-03-23 ENCOUNTER — Inpatient Hospital Stay (HOSPITAL_COMMUNITY): Payer: Medicare Other

## 2015-03-23 ENCOUNTER — Encounter (HOSPITAL_COMMUNITY): Payer: Self-pay | Admitting: Anesthesiology

## 2015-03-23 ENCOUNTER — Encounter (HOSPITAL_COMMUNITY)
Admission: EM | Disposition: A | Discharge status: Home / Self Care | Payer: Self-pay | Source: Home / Self Care | Attending: Internal Medicine

## 2015-03-23 DIAGNOSIS — I340 Nonrheumatic mitral (valve) insufficiency: Secondary | ICD-10-CM

## 2015-03-23 DIAGNOSIS — I4891 Unspecified atrial fibrillation: Secondary | ICD-10-CM

## 2015-03-23 HISTORY — PX: CARDIOVERSION: SHX1299

## 2015-03-23 HISTORY — PX: TEE WITHOUT CARDIOVERSION: SHX5443

## 2015-03-23 LAB — CBC
HCT: 35.8 % — ABNORMAL LOW (ref 39.0–52.0)
Hemoglobin: 11.3 g/dL — ABNORMAL LOW (ref 13.0–17.0)
MCH: 27.6 pg (ref 26.0–34.0)
MCHC: 31.6 g/dL (ref 30.0–36.0)
MCV: 87.5 fL (ref 78.0–100.0)
PLATELETS: 140 10*3/uL — AB (ref 150–400)
RBC: 4.09 MIL/uL — ABNORMAL LOW (ref 4.22–5.81)
RDW: 17.8 % — ABNORMAL HIGH (ref 11.5–15.5)
WBC: 6.6 10*3/uL (ref 4.0–10.5)

## 2015-03-23 LAB — BASIC METABOLIC PANEL
ANION GAP: 11 (ref 5–15)
BUN: 14 mg/dL (ref 6–20)
CHLORIDE: 99 mmol/L — AB (ref 101–111)
CO2: 31 mmol/L (ref 22–32)
Calcium: 8.2 mg/dL — ABNORMAL LOW (ref 8.9–10.3)
Creatinine, Ser: 1.28 mg/dL — ABNORMAL HIGH (ref 0.61–1.24)
GFR calc Af Amer: >60 mL/min (ref >60)
GFR, EST NON AFRICAN AMERICAN: 53 mL/min — AB (ref >60)
GLUCOSE: 99 mg/dL (ref 65–99)
POTASSIUM: 3.6 mmol/L (ref 3.5–5.1)
Sodium: 141 mmol/L (ref 135–145)

## 2015-03-23 LAB — AMIODARONE LEVEL
AMIODARONE LVL: 0.9 ug/mL — AB (ref 1.0–2.5)
N-Desethyl-Amiodarone: 0.7 ug/mL — ABNORMAL LOW (ref 1.0–2.5)

## 2015-03-23 SURGERY — CARDIOVERSION
Anesthesia: Monitor Anesthesia Care

## 2015-03-23 MED ORDER — EZETIMIBE 10 MG PO TABS
10.0000 mg | ORAL_TABLET | Freq: Every day | ORAL | Status: DC
  Administered 2015-03-23 – 2015-03-25 (×3): 10 mg via ORAL
  Filled 2015-03-23 (×3): qty 1

## 2015-03-23 MED ORDER — ONDANSETRON HCL 4 MG/2ML IJ SOLN: 4.0000 mg | Freq: Once | INTRAMUSCULAR | Status: DC | PRN

## 2015-03-23 MED ORDER — SODIUM CHLORIDE 0.9 % IV SOLN: INTRAVENOUS | Status: DC

## 2015-03-23 MED ORDER — PROPOFOL INFUSION 10 MG/ML OPTIME
INTRAVENOUS | Status: DC | PRN
  Administered 2015-03-23: 50 ug/kg/min via INTRAVENOUS

## 2015-03-23 MED ORDER — LACTATED RINGERS IV SOLN
INTRAVENOUS | Status: DC | PRN
  Administered 2015-03-23: 13:00:00 via INTRAVENOUS

## 2015-03-23 MED ORDER — BUTAMBEN-TETRACAINE-BENZOCAINE 2-2-14 % EX AERO
INHALATION_SPRAY | CUTANEOUS | Status: DC | PRN
  Administered 2015-03-23: 2 via TOPICAL

## 2015-03-23 MED ORDER — LIDOCAINE HCL (CARDIAC) 20 MG/ML IV SOLN
INTRAVENOUS | Status: DC | PRN
  Administered 2015-03-23: 30 mg via INTRAVENOUS

## 2015-03-23 MED ORDER — FUROSEMIDE 20 MG PO TABS
20.0000 mg | ORAL_TABLET | Freq: Every day | ORAL | Status: DC
  Administered 2015-03-24: 20 mg via ORAL
  Filled 2015-03-23: qty 1

## 2015-03-23 NOTE — Anesthesia Preprocedure Evaluation (Addendum)
Anesthesia Evaluation  Patient identified by MRN, date of birth, ID band Patient awake and Patient confused    Airway Mallampati: II       Dental   Pulmonary shortness of breath, sleep apnea , COPDformer smoker,  + rhonchi   + decreased breath sounds      Cardiovascular hypertension, + CAD, + Past MI and +CHF + dysrhythmias + Cardiac Defibrillator Rhythm:Irregular Rate:Abnormal     Neuro/Psych    GI/Hepatic   Endo/Other  Morbid obesity  Renal/GU      Musculoskeletal  (+) Arthritis -,   Abdominal   Peds  Hematology  (+) anemia ,   Anesthesia Other Findings   Reproductive/Obstetrics                           Anesthesia Physical Anesthesia Plan  ASA: IV  Anesthesia Plan: MAC   Post-op Pain Management:    Induction: Intravenous  Airway Management Planned: Mask  Additional Equipment:   Intra-op Plan:   Post-operative Plan:   Informed Consent: I have reviewed the patients History and Physical, chart, labs and discussed the procedure including the risks, benefits and alternatives for the proposed anesthesia with the patient or authorized representative who has indicated his/her understanding and acceptance.     Plan Discussed with: CRNA, Anesthesiologist and Surgeon  Anesthesia Plan Comments:         Anesthesia Quick Evaluation

## 2015-03-23 NOTE — H&P (View-Only) (Signed)
Patient Name: Chad Brown Date of Encounter: 03/22/2015  Principal Problem:  Acute on chronic systolic CHF (congestive heart failure) Active Problems:  Nonischemic cardiomyopathy  Atrial fibrillation with RVR  Hypertension  Hyperlipidemia  Obesity   SUBJECTIVE  Feeling more light headed today. He says his abdomen is much softer and he feels like he's lost a lot of fluid. Anticipating DCCV this afternoon. Dilt stopped yesterday and bb titrated - rates generally ~ 100.  CURRENT MEDS . amiodarone 200 mg Oral BID  . carvedilol 18.75 mg Oral BID WC  . dorzolamide-timolol 1 drop Right Eye BID  . ferrous sulfate 325 mg Oral TID WC  . finasteride 5 mg Oral q morning - 10a  . furosemide 40 mg Intravenous BID  . gabapentin 200 mg Oral Daily  . gabapentin 300 mg Oral QHS  . lipase/protease/amylase 36,000 Units Oral TID AC  . niacin 1,000 mg Oral QHS  . polyvinyl alcohol 1 drop Left Eye BID  . potassium chloride SA 20 mEq Oral q morning - 10a  . rivaroxaban 20 mg Oral Q supper  . tamsulosin 0.4 mg Oral QHS  . traMADol 50 mg Oral 3 times per day    OBJECTIVE  Filed Vitals:   03/22/15 0828 03/22/15 1501 03/22/15 2145 03/23/15 0626  BP: 129/80 132/90 135/94 130/95  Pulse: 83 86 91 106  Temp: 98.1 F (36.7 C) 98.2 F (36.8 C) 98 F (36.7 C) 97.5 F (36.4 C)  TempSrc: Oral Oral Oral Oral  Resp: 18 20 16 18   Height:      Weight:    89.676 kg (197 lb 11.2 oz)  SpO2: 96% 97% 95% 93%   PHYSICAL EXAM  General: Pleasant, NAD. Neuro: Alert and oriented X 3. Moves all extremities spontaneously. Psych: Normal affect. HEENT: Normal Neck: Supple without bruits. JVP ~ 8 cm. Lungs: Resp regular and unlabored, CTA. Heart: RRR no s3, s4, or murmurs. Abdomen: Soft, non-tender, BS + x 4.  Extremities: No clubbing, cyanosis or edema. DP/PT/Radials 2+ and equal  bilaterally.     I/O last 3 completed shifts: In: [id number] [P.O.:1560; I.V.:5015] Out: 5650 [Urine:5650] Total I/O In: 0  Out: 250 [Urine:250]    Filed Weights   Filed Weights   03/21/15 0644 03/22/15 0556 03/23/15 0626  Weight: 93.214 kg (205 lb 8 oz) 90.719 kg (200 lb) 89.676 kg (197 lb 11.2 oz)    LABS  Lab Results  Component Value Date   CREATININE 1.28* 03/23/2015   BUN 14 03/23/2015   NA 141 03/23/2015   K 3.6 03/23/2015   CL 99* 03/23/2015   CO2 31 03/23/2015    TELE  Afib, V-pacing on demand. HR trending 100-110's.  Radiology/Studies  Dg Chest 2 View  03/21/2015 CLINICAL DATA: Acute onset of shortness of breath and throat tightness. Dizziness. Initial encounter. EXAM: CHEST 2 VIEW COMPARISON: Chest radiograph performed 03/09/2015 FINDINGS: The lungs are well-aerated. Vascular congestion is noted. Mild bibasilar opacities could reflect minimal interstitial edema. No pleural effusion or pneumothorax is seen. The heart is borderline normal in size. A pacemaker/AICD is noted at the left chest wall, with leads ending at the right atrium, right ventricle and coronary sinus. No acute osseous abnormalities are seen. IMPRESSION: Vascular congestion noted. Mild bibasilar opacities could reflect minimal interstitial edema. Electronically Signed By: Garald Balding M.D. On: 03/21/2015 03:00   ASSESSMENT AND PLAN  1. Acute on chronic systolic chf/NICM: Pt presented with 7 lbs wt gain since June 7th and progressive dyspnea  in the setting of rapid afib. Dilt gtt stopped yesterday and coreg increased. Rates in low 100s in afib. Abd is soft. No LEE. Lungs clear. Wt down further this AM.  BUN/Creat bumping slightly. Transition to PO lasix today. Cont Coreg @ 18.75 mg BID. Home dose of ARB not ordered in setting of mild renal insuff. Will plan to resume tomorrow.   2. PAF with RVR: Increased amio to 200 bid 6.20. DCCV scheduled for today at 1300. Cont  bb/xarelto. He has been in afib 100% of the time since 6/16 when he was seen in device clinic and we cannot be sure that he wasn't in afib during brief period of time on 6/8 and/or 6/9, that xarelto was on hold following ICD placement.  3. Essential HTN: BP stable.  Plan to resume home dose of ARB tomorrow.  4. HL: LDL 153. Statin intolerant. Will add zetia.  5. Morbid Obesity: Will benefit from regular activity following discharge/recovery.   Murray Hodgkins, NP 03/23/2015, 9:22 AM

## 2015-03-23 NOTE — Anesthesia Postprocedure Evaluation (Signed)
  Anesthesia Post-op Note  Patient: Chad Brown  Procedure(s) Performed: Procedure(s): CARDIOVERSION (N/A) TRANSESOPHAGEAL ECHOCARDIOGRAM (TEE) (N/A)  Patient Location: PACU  Anesthesia Type:MAC  Level of Consciousness: awake, oriented, sedated and patient cooperative  Airway and Oxygen Therapy: Patient Spontanous Breathing  Post-op Pain: none  Post-op Assessment: Post-op Vital signs reviewed, Patient's Cardiovascular Status Stable, Respiratory Function Stable, Patent Airway and No signs of Nausea or vomiting              Post-op Vital Signs: stable  Last Vitals:  Filed Vitals:   03/23/15 1355  BP: 132/78  Pulse: 59  Temp:   Resp: 15    Complications: No apparent anesthesia complications

## 2015-03-23 NOTE — Progress Notes (Signed)
Patient Name: Chad Brown Date of Encounter: 03/22/2015  Principal Problem:  Acute on chronic systolic CHF (congestive heart failure) Active Problems:  Nonischemic cardiomyopathy  Atrial fibrillation with RVR  Hypertension  Hyperlipidemia  Obesity   SUBJECTIVE  Feeling more light headed today. He says his abdomen is much softer and he feels like he's lost a lot of fluid. Anticipating DCCV this afternoon. Dilt stopped yesterday and bb titrated - rates generally ~ 100.  CURRENT MEDS . amiodarone 200 mg Oral BID  . carvedilol 18.75 mg Oral BID WC  . dorzolamide-timolol 1 drop Right Eye BID  . ferrous sulfate 325 mg Oral TID WC  . finasteride 5 mg Oral q morning - 10a  . furosemide 40 mg Intravenous BID  . gabapentin 200 mg Oral Daily  . gabapentin 300 mg Oral QHS  . lipase/protease/amylase 36,000 Units Oral TID AC  . niacin 1,000 mg Oral QHS  . polyvinyl alcohol 1 drop Left Eye BID  . potassium chloride SA 20 mEq Oral q morning - 10a  . rivaroxaban 20 mg Oral Q supper  . tamsulosin 0.4 mg Oral QHS  . traMADol 50 mg Oral 3 times per day    OBJECTIVE  Filed Vitals:   03/22/15 0828 03/22/15 1501 03/22/15 2145 03/23/15 0626  BP: 129/80 132/90 135/94 130/95  Pulse: 83 86 91 106  Temp: 98.1 F (36.7 C) 98.2 F (36.8 C) 98 F (36.7 C) 97.5 F (36.4 C)  TempSrc: Oral Oral Oral Oral  Resp: 18 20 16 18   Height:      Weight:    89.676 kg (197 lb 11.2 oz)  SpO2: 96% 97% 95% 93%   PHYSICAL EXAM  General: Pleasant, NAD. Neuro: Alert and oriented X 3. Moves all extremities spontaneously. Psych: Normal affect. HEENT: Normal Neck: Supple without bruits. JVP ~ 8 cm. Lungs: Resp regular and unlabored, CTA. Heart: RRR no s3, s4, or murmurs. Abdomen: Soft, non-tender, BS + x 4.  Extremities: No clubbing, cyanosis or edema. DP/PT/Radials 2+ and equal  bilaterally.     I/O last 3 completed shifts: In: [id number] [P.O.:1560; I.V.:5015] Out: 5650 [Urine:5650] Total I/O In: 0  Out: 250 [Urine:250]    Filed Weights   Filed Weights   03/21/15 0644 03/22/15 0556 03/23/15 0626  Weight: 93.214 kg (205 lb 8 oz) 90.719 kg (200 lb) 89.676 kg (197 lb 11.2 oz)    LABS  Lab Results  Component Value Date   CREATININE 1.28* 03/23/2015   BUN 14 03/23/2015   NA 141 03/23/2015   K 3.6 03/23/2015   CL 99* 03/23/2015   CO2 31 03/23/2015    TELE  Afib, V-pacing on demand. HR trending 100-110's.  Radiology/Studies  Dg Chest 2 View  03/21/2015 CLINICAL DATA: Acute onset of shortness of breath and throat tightness. Dizziness. Initial encounter. EXAM: CHEST 2 VIEW COMPARISON: Chest radiograph performed 03/09/2015 FINDINGS: The lungs are well-aerated. Vascular congestion is noted. Mild bibasilar opacities could reflect minimal interstitial edema. No pleural effusion or pneumothorax is seen. The heart is borderline normal in size. A pacemaker/AICD is noted at the left chest wall, with leads ending at the right atrium, right ventricle and coronary sinus. No acute osseous abnormalities are seen. IMPRESSION: Vascular congestion noted. Mild bibasilar opacities could reflect minimal interstitial edema. Electronically Signed By: Garald Balding M.D. On: 03/21/2015 03:00   ASSESSMENT AND PLAN  1. Acute on chronic systolic chf/NICM: Pt presented with 7 lbs wt gain since June 7th and progressive dyspnea  in the setting of rapid afib. Dilt gtt stopped yesterday and coreg increased. Rates in low 100s in afib. Abd is soft. No LEE. Lungs clear. Wt down further this AM.  BUN/Creat bumping slightly. Transition to PO lasix today. Cont Coreg @ 18.75 mg BID. Home dose of ARB not ordered in setting of mild renal insuff. Will plan to resume tomorrow.   2. PAF with RVR: Increased amio to 200 bid 6.20. DCCV scheduled for today at 1300. Cont  bb/xarelto. He has been in afib 100% of the time since 6/16 when he was seen in device clinic and we cannot be sure that he wasn't in afib during brief period of time on 6/8 and/or 6/9, that xarelto was on hold following ICD placement.  3. Essential HTN: BP stable.  Plan to resume home dose of ARB tomorrow.  4. HL: LDL 153. Statin intolerant. Will add zetia.  5. Morbid Obesity: Will benefit from regular activity following discharge/recovery.   Murray Hodgkins, NP 03/23/2015, 9:22 AM

## 2015-03-23 NOTE — Transfer of Care (Signed)
Immediate Anesthesia Transfer of Care Note  Patient: Chad Brown  Procedure(s) Performed: Procedure(s): CARDIOVERSION (N/A) TRANSESOPHAGEAL ECHOCARDIOGRAM (TEE) (N/A)  Patient Location: PACU and Endoscopy Unit  Anesthesia Type:MAC  Level of Consciousness: alert , patient cooperative and responds to stimulation  Airway & Oxygen Therapy: Patient Spontanous Breathing and Patient connected to nasal cannula oxygen  Post-op Assessment: Report given to RN and Post -op Vital signs reviewed and stable  Post vital signs: Reviewed and stable  Last Vitals:  Filed Vitals:   03/23/15 1216  BP:   Pulse: 109  Temp: 36.5 C  Resp: 14    Complications: No apparent anesthesia complications

## 2015-03-23 NOTE — Progress Notes (Signed)
Heart Failure Navigator Consult Note  Presentation: Chad Brown is a 74 yo man with PMH of chronic systolic heart failure, paroxysmal atrial fibrillation, melanoma who presents with worsening shortness of breath and found to have atrial fibrillation with RVR. He recently had a BIV-ICD placed 03/08/15 for ischemic cardiomyopathy with underlying LBBB and reduced ejection fraction. He presents today with one week of weight gain (7 lbs), decreased responsiveness to lasix and progressive shortness of breath over the last 24 hours. He has been compliant with his medications, no significant travel or dietary changes. In the ER he was started on diltiazem gtt and HR improved to high 90s. He denies recent illness. He really just has noticed weight gain, shortness of breath over 24 hours and decreased UOP to lasix. No fevers/chills/nausea/vomiting/diarrhea   Past Medical History  Diagnosis Date  . Hyperlipidemia   . Hypertension   . Systolic CHF     a. New dx 12/2012 ?NICM, may be r/t afib. b. Nuc 03/2013 - normal.  . PAF (paroxysmal atrial fibrillation)     a. Dx 12/2012, s/p TEE/DCCV 01/26/13. b. On Xarelto.  . Sinus bradycardia 10/02/2014  . Anemia     iron deficient  . Cholelithiasis   . Atherosclerosis   . Colon polyp, hyperplastic 5/16    removed precancerous lesions  . AICD (automatic cardioverter/defibrillator) present   . CHF (congestive heart failure)   . Myocardial infarction 1998  . Arthritis     "about all my joints; hands, knees, back" (03/08/2015)  . Urinary hesitancy due to benign prostatic hypertrophy   . Melanoma of eye 2000's    "right; it's never been biopsied"  . Melanoma of lower leg 2015    "left; right at my knee"    History   Social History  . Marital Status: Married    Spouse Name: N/A  . Number of Children: 3  . Years of Education: N/A   Occupational History  . retired    Social History Main Topics  . Smoking status: Former Smoker -- 3.00 packs/day for 48  years    Types: Cigarettes    Quit date: 09/28/2000  . Smokeless tobacco: Never Used  . Alcohol Use: No  . Drug Use: No  . Sexual Activity: Yes   Other Topics Concern  . None   Social History Narrative   Pt lives in [place] with spouse.  3 children are grown and healthy.   Retired.  Ran a country store for 30 years, previously worked in the [employer] for 16 years    ECHO:Study Conclusions--01/26/13  - Left ventricle: Systolic function was moderately to severely reduced. The estimated ejection fraction was in the range of 30% to 35%. Diffuse hypokinesis. - Aortic valve: Mildly calcified annulus. - Mitral valve: No evidence of vegetation. Mild regurgitation. - Left atrium: The atrium was moderately dilated. No evidence of thrombus in the atrial cavity or appendage. - Atrial septum: No defect or patent foramen ovale was identified. - Tricuspid valve: No evidence of vegetation. - Pulmonic valve: No evidence of vegetation. Transesophageal echocardiography. 2D and color Doppler. Height: Height: 170.2cm. Height: 67in. Weight: Weight: 99.1kg. Weight: 218lb. Body mass index: BMI: 34.2kg/m^2. Body surface area:  BSA: 2.60m^2. Blood pressure: 143/80. Patient status: Inpatient. Location: Endoscopy  BNP    Component Value Date/Time   BNP 968.4* 03/21/2015 0110    ProBNP    Component Value Date/Time   PROBNP 385.0* 10/29/2014 0953     Education Assessment and Provision:  Detailed education and instructions provided on heart failure disease management including the following:  Signs and symptoms of Heart Failure When to call the physician Importance of daily weights Low sodium diet Fluid restriction Medication management Anticipated future follow-up appointments  Patient education given on each of the above topics.  Patient acknowledges understanding and acceptance of all instructions.  I spoke with [name] regarding his HF history.   He tells me that he does his best to eat "low sodium".  He weighs daily and knows signs and symptoms of HF.  He seems to have a good basic knowledge of his HF and HF recommendations.  He denies any issues with getting or taking prescribed medications.  He is scheduled for a TEE/Cardioversion today.   He sees Dr. Johnsie Cancel as an outpatient and will [facility].  I encouraged him to call me after discharge with any questions regarding his HF.     Education Materials:  "Living Better With Heart Failure" Booklet, Daily Weight Tracker Tool    High Risk Criteria for Readmission and/or Poor Patient Outcomes:    EF <30%- No 30-35%  2 or more admissions in 6 months- No  Difficult social situation- No  Demonstrates medication noncompliance- No   Barriers of Care:  Knowledge and compliance  Discharge Planning:  Plans to discharge home with his wife.

## 2015-03-23 NOTE — CV Procedure (Signed)
    Electrical Cardioversion Procedure Note HOKE FROMM [id number] [birth year]  Procedure: Electrical Cardioversion Indications:  Atrial Fibrillation  Time Out: Verified patient identification, verified procedure,medications/allergies/relevent history reviewed, required imaging and test results available.  Performed  Procedure Details  The patient was NPO after midnight. Anesthesia was administered at the beside  by Dr.Smith with  propofol.  Cardioversion was performed with synchronized biphasic defibrillation via AP pads with 150 joules.  1 attempt(s) were performed.  The patient converted to normal sinus rhythm. The patient tolerated the procedure well   IMPRESSION:  Successful cardioversion of atrial fibrillation BI-V ICD functioning well (interrogated)  TEE with EF 15-20%    Damauri Minion 03/23/2015, 2:04 PM

## 2015-03-23 NOTE — Progress Notes (Signed)
  Echocardiogram Echocardiogram Transesophageal has been performed.  Johny Chess 03/23/2015, 1:34 PM

## 2015-03-23 NOTE — Interval H&P Note (Signed)
History and Physical Interval Note:  03/23/2015 1:06 PM  Chad Brown  has presented today for surgery, with the diagnosis of afib  The various methods of treatment have been discussed with the patient and family. After consideration of risks, benefits and other options for treatment, the patient has consented to  Procedure(s): CARDIOVERSION (N/A) TRANSESOPHAGEAL ECHOCARDIOGRAM (TEE) (N/A) as a surgical intervention .  The patient's history has been reviewed, patient examined, no change in status, stable for surgery.  I have reviewed the patient's chart and labs.  Questions were answered to the patient's satisfaction.     SKAINS, MARK

## 2015-03-24 ENCOUNTER — Encounter (HOSPITAL_COMMUNITY): Payer: Self-pay | Admitting: Cardiology

## 2015-03-24 ENCOUNTER — Ambulatory Visit (HOSPITAL_COMMUNITY): Payer: Medicare Other | Admitting: Hematology & Oncology

## 2015-03-24 LAB — BASIC METABOLIC PANEL
Anion gap: 8 (ref 5–15)
BUN: 18 mg/dL (ref 6–20)
CHLORIDE: 100 mmol/L — AB (ref 101–111)
CO2: 35 mmol/L — ABNORMAL HIGH (ref 22–32)
CREATININE: 1.36 mg/dL — AB (ref 0.61–1.24)
Calcium: 8.4 mg/dL — ABNORMAL LOW (ref 8.9–10.3)
GFR calc non Af Amer: 50 mL/min — ABNORMAL LOW (ref >60)
GFR, EST AFRICAN AMERICAN: 58 mL/min — AB (ref >60)
GLUCOSE: 70 mg/dL (ref 65–99)
POTASSIUM: 3.7 mmol/L (ref 3.5–5.1)
Sodium: 143 mmol/L (ref 135–145)

## 2015-03-24 MED ORDER — FUROSEMIDE 40 MG PO TABS
40.0000 mg | ORAL_TABLET | Freq: Every day | ORAL | Status: DC
  Administered 2015-03-25: 40 mg via ORAL
  Filled 2015-03-24: qty 1

## 2015-03-24 MED ORDER — FUROSEMIDE 10 MG/ML IJ SOLN
20.0000 mg | Freq: Once | INTRAMUSCULAR | Status: AC
  Administered 2015-03-24: 20 mg via INTRAVENOUS
  Filled 2015-03-24: qty 2

## 2015-03-24 NOTE — Progress Notes (Signed)
Patient Name: Chad Brown Date of Encounter: 03/24/2015  Primary Cardiologist: Dr. Johnsie Cancel   Principal Problem:   Acute on chronic systolic CHF (congestive heart failure) Active Problems:   Hyperlipidemia   Hypertension   Nonischemic cardiomyopathy   Obesity   Atrial fibrillation with RVR    SUBJECTIVE  Feeling good this morning, denies any CP.   CURRENT MEDS . amiodarone  200 mg Oral BID  . carvedilol  18.75 mg Oral BID WC  . dorzolamide-timolol  1 drop Right Eye BID  . ezetimibe  10 mg Oral Daily  . ferrous sulfate  325 mg Oral TID WC  . finasteride  5 mg Oral q morning - 10a  . furosemide  20 mg Oral Daily  . gabapentin  200 mg Oral Daily  . gabapentin  300 mg Oral QHS  . lipase/protease/amylase  36,000 Units Oral TID AC  . niacin  1,000 mg Oral QHS  . polyvinyl alcohol  1 drop Left Eye BID  . potassium chloride SA  20 mEq Oral BID  . rivaroxaban  20 mg Oral Q supper  . sodium chloride  3 mL Intravenous Q12H  . tamsulosin  0.4 mg Oral QHS  . traMADol  50 mg Oral 3 times per day    OBJECTIVE  Filed Vitals:   03/23/15 1355 03/23/15 1422 03/23/15 2026 03/24/15 0500  BP: 132/78 112/70 129/73 123/50  Pulse: 59 60 62 59  Temp:   98.2 F (36.8 C) 97.3 F (36.3 C)  TempSrc:   Oral Oral  Resp: 15 18 18 18   Height:      Weight:    202 lb (91.627 kg)  SpO2: 95% 97% 98% 98%    Intake/Output Summary (Last 24 hours) at 03/24/15 0918 Last data filed at 03/24/15 0819  Gross per 24 hour  Intake   3670 ml  Output    350 ml  Net   3320 ml   Filed Weights   03/23/15 0626 03/23/15 1216 03/24/15 0500  Weight: 197 lb 11.2 oz (89.676 kg) 197 lb (89.359 kg) 202 lb (91.627 kg)    PHYSICAL EXAM  General: Pleasant, NAD. Neuro: Alert and oriented X 3. Moves all extremities spontaneously. Psych: Normal affect. HEENT:  Normal  Neck: Supple without bruits  +JVD. Lungs:  Resp regular and unlabored, CTA. Heart: RRR no s3, s4, or murmurs. Abdomen: Soft, non-tender,  non-distended, BS + x 4.  Extremities: No clubbing, cyanosis or edema. DP/PT/Radials 2+ and equal bilaterally.  Accessory Clinical Findings  CBC  Recent Labs  03/22/15 0536 03/23/15 0507  WBC 6.7 6.6  HGB 10.8* 11.3*  HCT 33.7* 35.8*  MCV 86.9 87.5  PLT 134* [id number]*   Basic Metabolic Panel  Recent Labs  03/23/15 0507 03/24/15 0347  NA 141 143  K 3.6 3.7  CL 99* 100*  CO2 31 35*  GLUCOSE 99 70  BUN 14 18  CREATININE 1.28* 1.36*  CALCIUM 8.2* 8.4*   Fasting Lipid Panel  Recent Labs  03/22/15 0536  CHOL 200  HDL 26*  LDLCALC 153*  TRIG 105  CHOLHDL 7.7    TELE NSR with paced rhythm overnight.     ECG  No new EKG  TEE 03/23/2015  LV EF: 15% -  20%  ------------------------------------------------------------------- Indications:   Atrial fibrillation - 427.31.  ------------------------------------------------------------------- Study Conclusions  - Left ventricle: Systolic function was severely reduced. The estimated ejection fraction was in the range of 15% to 20%. - Aortic valve: No evidence  of vegetation. There was trivial regurgitation. - Mitral valve: No evidence of vegetation. There was mild regurgitation directed eccentrically. - Left atrium: The atrium was dilated. No evidence of thrombus in the appendage. - Right atrium: No evidence of thrombus in the atrial cavity or appendage. - Atrial septum: No defect or patent foramen ovale was identified. - Tricuspid valve: No evidence of vegetation. There was moderate regurgitation. - Pulmonic valve: No evidence of vegetation.  Impressions:  - Cardioversion successful.     Radiology/Studies  Dg Chest 2 View  03/21/2015   CLINICAL DATA:  Acute onset of shortness of breath and throat tightness. Dizziness. Initial encounter.  EXAM: CHEST  2 VIEW  COMPARISON:  Chest radiograph performed 03/09/2015  FINDINGS: The lungs are well-aerated. Vascular congestion is noted. Mild bibasilar  opacities could reflect minimal interstitial edema. No pleural effusion or pneumothorax is seen.  The heart is borderline normal in size. A pacemaker/AICD is noted at the left chest wall, with leads ending at the right atrium, right ventricle and coronary sinus. No acute osseous abnormalities are seen.  IMPRESSION: Vascular congestion noted. Mild bibasilar opacities could reflect minimal interstitial edema.   Electronically Signed   By: Garald Balding M.D.   On: 03/21/2015 03:00   Dg Chest 2 View  03/09/2015   CLINICAL DATA:  Status post pacemaker placement  EXAM: CHEST  2 VIEW  COMPARISON:  Chest x-ray of October 02, 2014  FINDINGS: There has been interval placement of a permanent pacemaker defibrillator. Positioning of the generator and electrodes is radiographically good. The cardiac silhouette remains enlarged. The pulmonary vascularity is normal. There is no pleural effusion. No in infiltrate or atelectasis is observed. The bony thorax is unremarkable.  IMPRESSION: There is no postprocedure complication following AICD placement. Stable cardiomegaly.   Electronically Signed   By: David  Martinique M.D.   On: 03/09/2015 07:44    ASSESSMENT AND PLAN  74 yo male with PMH of chronic systolic HF, ICM s/p CRT-D 03/08/2015, PAF on xarelto and amiodarone, melanoma who presents with worsening SOB and found to have a-fib with RVR. CHADS2vASC = 4.  1. PAF with RVR  - amiodarone increased to 200mg  BID. MDT device interrogated, in a-fib since 6/16  - s/p successful DCCV 03/23/2015, plan to leave on amiodarone 200mg  BID for 1 week before decrease to 300mg  daily.    2. Acute on chronic systolic HF  - s/p IV diuresis. Weight back up to admission weight, I/O indicate pt's UOP disproportionately lower than intake. On exam, does have significant JVD, however lung clear and no LE edema. Cr creeping up  - EF 15-20% on TEE, reduced compare to previous Echo. Currently on 20mg  daiy of PO lasix  - consider increase PO lasix to  40mg  daily prior to discharge, although pt is no longer in acute HF, weight and I/O is concerning for gradual retention of fluid.   3. ICM s/p MDT CRT-D 03/08/2015  - EF 15-20% on TEE  4. HTN: will hold off on restarting ARB as patient SBP 110-130 is good.  5. HLD: intolerant to statins     Signed, Almyra Deforest PA-C Pager: [phone]

## 2015-03-24 NOTE — Care Management Note (Signed)
Case Management Note  Patient Details  Name: Chad Brown MRN: [id number] Date of Birth: 1941/09/24  Subjective/Objective:     Admitted with CHF               Action/Plan: CM following for DCP, patient lives with spouse at home, independent of ADL's.  Expected Discharge Date:    03/25/3015 possibly              Expected Discharge Plan:  Home/Self Care  Discharge planning Services  CM Consult  Status of Service:  In process, will continue to follow  Medicare Important Message Given:  Yes Date Medicare IM Given:  03/24/15 Medicare IM give by:  Mindi Slicker RN  Royston Bake, RN,BSN,MHA [phone] 03/24/2015, 10:38 AM

## 2015-03-25 ENCOUNTER — Encounter (HOSPITAL_COMMUNITY): Payer: Self-pay | Admitting: Physician Assistant

## 2015-03-25 MED ORDER — FUROSEMIDE 20 MG PO TABS: 20.0000 mg | ORAL_TABLET | Freq: Every day | ORAL | Status: DC

## 2015-03-25 MED ORDER — AMIODARONE HCL 200 MG PO TABS: 200.0000 mg | ORAL_TABLET | Freq: Two times a day (BID) | ORAL | Status: DC

## 2015-03-25 MED ORDER — HYDRALAZINE HCL 10 MG PO TABS: 10.0000 mg | ORAL_TABLET | Freq: Three times a day (TID) | ORAL | Status: DC

## 2015-03-25 MED ORDER — CARVEDILOL 12.5 MG PO TABS: 18.7500 mg | ORAL_TABLET | Freq: Two times a day (BID) | ORAL | Status: DC

## 2015-03-25 MED ORDER — EZETIMIBE 10 MG PO TABS: 10.0000 mg | ORAL_TABLET | Freq: Every day | ORAL | Status: DC

## 2015-03-25 MED ORDER — POTASSIUM CHLORIDE CRYS ER 20 MEQ PO TBCR: 20.0000 meq | EXTENDED_RELEASE_TABLET | Freq: Every morning | ORAL | Status: DC

## 2015-03-25 NOTE — Discharge Instructions (Signed)
BMET on Monday at Dr. Murrell Redden office  Atrial Fibrillation Atrial fibrillation is a type of irregular heart rhythm (arrhythmia). During atrial fibrillation, the upper chambers of the heart (atria) quiver continuously in a chaotic pattern. This causes an irregular and often rapid heart rate.  Atrial fibrillation is the result of the heart becoming overloaded with disorganized signals that tell it to beat. These signals are normally released one at a time by a part of the right atrium called the sinoatrial node. They then travel from the atria to the lower chambers of the heart (ventricles), causing the atria and ventricles to contract and pump blood as they pass. In atrial fibrillation, parts of the atria outside of the sinoatrial node also release these signals. This results in two problems. First, the atria receive so many signals that they do not have time to fully contract. Second, the ventricles, which can only receive one signal at a time, beat irregularly and out of rhythm with the atria.  There are three types of atrial fibrillation:   Paroxysmal. Paroxysmal atrial fibrillation starts suddenly and stops on its own within a week.  Persistent. Persistent atrial fibrillation lasts for more than a week. It may stop on its own or with treatment.  Permanent. Permanent atrial fibrillation does not go away. Episodes of atrial fibrillation may lead to permanent atrial fibrillation. Atrial fibrillation can prevent your heart from pumping blood normally. It increases your risk of stroke and can lead to heart failure.  CAUSES   Heart conditions, including a heart attack, heart failure, coronary artery disease, and heart valve conditions.   Inflammation of the sac that surrounds the heart (pericarditis).  Blockage of an artery in the lungs (pulmonary embolism).  Pneumonia or other infections.  Chronic lung disease.  Thyroid problems, especially if the thyroid is overactive  (hyperthyroidism).  Caffeine, excessive alcohol use, and use of some illegal drugs.   Use of some medicines, including certain decongestants and diet pills.  Heart surgery.   Birth defects.  Sometimes, no cause can be found. When this happens, the atrial fibrillation is called lone atrial fibrillation. The risk of complications from atrial fibrillation increases if you have lone atrial fibrillation and you are age 57 years or older. RISK FACTORS  Heart failure.  Coronary artery disease.  Diabetes mellitus.   High blood pressure (hypertension).   Obesity.   Other arrhythmias.   Increased age. SIGNS AND SYMPTOMS   A feeling that your heart is beating rapidly or irregularly.   A feeling of discomfort or pain in your chest.   Shortness of breath.   Sudden light-headedness or weakness.   Getting tired easily when exercising.   Urinating more often than normal (mainly when atrial fibrillation first begins).  In paroxysmal atrial fibrillation, symptoms may start and suddenly stop. DIAGNOSIS  Your health care provider may be able to detect atrial fibrillation when taking your pulse. Your health care provider may have you take a test called an ambulatory electrocardiogram (ECG). An ECG records your heartbeat patterns over a 24-hour period. You may also have other tests, such as:  Transthoracic echocardiogram (TTE). During echocardiography, sound waves are used to evaluate how blood flows through your heart.  Transesophageal echocardiogram (TEE).  Stress test. There is more than one type of stress test. If a stress test is needed, ask your health care provider about which type is best for you.  Chest X-ray exam.  Blood tests.  Computed tomography (CT). TREATMENT  Treatment may include:  Treating any underlying conditions. For example, if you have an overactive thyroid, treating the condition may correct atrial fibrillation.  Taking medicine. Medicines may  be given to control a rapid heart rate or to prevent blood clots, heart failure, or a stroke.  Having a procedure to correct the rhythm of the heart:  Electrical cardioversion. During electrical cardioversion, a controlled, low-energy shock is delivered to the heart through your skin. If you have chest pain, very low blood pressure, or sudden heart failure, this procedure may need to be done as an emergency.  Catheter ablation. During this procedure, heart tissues that send the signals that cause atrial fibrillation are destroyed.  Surgical ablation. During this surgery, thin lines of heart tissue that carry the abnormal signals are destroyed. This procedure can either be an open-heart surgery or a minimally invasive surgery. With the minimally invasive surgery, small cuts are made to access the heart instead of a large opening.  Pulmonary venous isolation. During this surgery, tissue around the veins that carry blood from the lungs (pulmonary veins) is destroyed. This tissue is thought to carry the abnormal signals. HOME CARE INSTRUCTIONS   Take medicines only as directed by your health care provider. Some medicines can make atrial fibrillation worse or recur.  If blood thinners were prescribed by your health care provider, take them exactly as directed. Too much blood-thinning medicine can cause bleeding. If you take too little, you will not have the needed protection against stroke and other problems.  Perform blood tests at home if directed by your health care provider. Perform blood tests exactly as directed.  Quit smoking if you smoke.  Do not drink alcohol.  Do not drink caffeinated beverages such as coffee, soda, and some teas. You may drink decaffeinated coffee, soda, or tea.   Maintain a healthy weight.Do not use diet pills unless your health care provider approves. They may make heart problems worse.   Follow diet instructions as directed by your health care  provider.  Exercise regularly as directed by your health care provider.  Keep all follow-up visits as directed by your health care provider. This is important. PREVENTION  The following substances can cause atrial fibrillation to recur:   Caffeinated beverages.  Alcohol.  Certain medicines, especially those used for breathing problems.  Certain herbs and herbal medicines, such as those containing ephedra or ginseng.  Illegal drugs, such as cocaine and amphetamines. Sometimes medicines are given to prevent atrial fibrillation from recurring. Proper treatment of any underlying condition is also important in helping prevent recurrence.  SEEK MEDICAL CARE IF:  You notice a change in the rate, rhythm, or strength of your heartbeat.  You suddenly begin urinating more frequently.  You tire more easily when exerting yourself or exercising. SEEK IMMEDIATE MEDICAL CARE IF:   You have chest pain, abdominal pain, sweating, or weakness.  You feel nauseous.  You have shortness of breath.  You suddenly have swollen feet and ankles.  You feel dizzy.  Your face or limbs feel numb or weak.  You have a change in your vision or speech. MAKE SURE YOU:   Understand these instructions.  Will watch your condition.  Will get help right away if you are not doing well or get worse. Document Released: 09/17/2005 Document Revised: 02/01/2014 Document Reviewed: 10/28/2012 [facility] Patient Information 2015 [place], [place]. This information is not intended to replace advice given to you by your health care provider. Make sure you discuss any questions you have with your health  care provider.  

## 2015-03-25 NOTE — Progress Notes (Signed)
Patient Name: Chad Brown Date of Encounter: 03/25/2015  Principal Problem:   Acute on chronic systolic CHF (congestive heart failure) Active Problems:   Hyperlipidemia   Hypertension   Nonischemic cardiomyopathy   Obesity   Atrial fibrillation with RVR   Primary Cardiologist: Dr Johnsie Cancel  Patient Profile: 74 yo male w/ hx S-CHF, PAF, melanoma, was admitted 06/20 with SOB, afib/RVR. TEE/DCCV on 06/22.   SUBJECTIVE: Denies chest pain or SOB. Feels his dry weight is now 196 (new low). He has ambulated and done well.  OBJECTIVE Filed Vitals:   03/24/15 0500 03/24/15 1542 03/24/15 2047 03/25/15 0421  BP: 123/50 128/59 142/73 133/76  Pulse: 59 59 61 68  Temp: 97.3 F (36.3 C) 97.7 F (36.5 C) 98 F (36.7 C) 98.2 F (36.8 C)  TempSrc: Oral Oral Oral Oral  Resp: 18 18 18 18   Height:      Weight: 202 lb (91.627 kg)   197 lb 3.2 oz (89.449 kg)  SpO2: 98% 98% 96% 98%    Intake/Output Summary (Last 24 hours) at 03/25/15 1107 Last data filed at 03/25/15 0827  Gross per 24 hour  Intake    780 ml  Output   1000 ml  Net   -220 ml   Filed Weights   03/23/15 1216 03/24/15 0500 03/25/15 0421  Weight: 197 lb (89.359 kg) 202 lb (91.627 kg) 197 lb 3.2 oz (89.449 kg)    PHYSICAL EXAM General: Well developed, well nourished, male in no acute distress. Head: Normocephalic, atraumatic.  Neck: Supple without bruits, JVD 8 cm. Lungs:  Resp regular and unlabored, CTA. Heart: RRR, S1, S2, no S3, S4, 2/6 murmur; no rub. Abdomen: Soft, non-tender, non-distended, BS + x 4.  Extremities: No clubbing, cyanosis, edema.  Neuro: Alert and oriented X 3. Moves all extremities spontaneously. Psych: Normal affect.  LABS: CBC: Recent Labs  03/23/15 0507  WBC 6.6  HGB 11.3*  HCT 35.8*  MCV 87.5  PLT [id number]*   Basic Metabolic Panel: Recent Labs  03/23/15 0507 03/24/15 0347  NA 141 143  K 3.6 3.7  CL 99* 100*  CO2 31 35*  GLUCOSE 99 70  BUN 14 18  CREATININE 1.28* 1.36*    CALCIUM 8.2* 8.4*    TELE:   AV pacing     Current Medications:  . amiodarone  200 mg Oral BID  . carvedilol  18.75 mg Oral BID WC  . dorzolamide-timolol  1 drop Right Eye BID  . ezetimibe  10 mg Oral Daily  . ferrous sulfate  325 mg Oral TID WC  . finasteride  5 mg Oral q morning - 10a  . furosemide  40 mg Oral Daily  . gabapentin  200 mg Oral Daily  . gabapentin  300 mg Oral QHS  . lipase/protease/amylase  36,000 Units Oral TID AC  . niacin  1,000 mg Oral QHS  . polyvinyl alcohol  1 drop Left Eye BID  . potassium chloride SA  20 mEq Oral BID  . rivaroxaban  20 mg Oral Q supper  . sodium chloride  3 mL Intravenous Q12H  . tamsulosin  0.4 mg Oral QHS  . traMADol  50 mg Oral 3 times per day   . sodium chloride 250 mL (03/21/15 1230)    ASSESSMENT AND PLAN: Principal Problem:   Acute on chronic systolic CHF (congestive heart failure) - feels he is at/almost at his dry weight - EF lower than previous, recheck 3 months. - 1500  cc fluid restriction (intake over 2 L 06/23) - volume overload may be partly due to afib, consider going back on Lasix 20 mg qd since BUN/Cr increasing and adding prn Lasix 40 mg for weight gain  Active Problems:   Hyperlipidemia - continue Zetia    Hypertension - Coreg 18.75 mg bid, since he is pacing, can we increase to 25 mg bid? - hold ARB with increasing Cr    Nonischemic cardiomyopathy - recheck in 3 months - on BB, no ACE/ARB for now with worsening renal function - Not on hydralazine/nitrates, can add as OP. - can also consider Entresto, but with ARB component and elevated Cr, would do as OP    Obesity - diet compliance    Atrial fibrillation with RVR - pacing after TEE/DCCV - continue amio 200 mg bid >>300 mg qd in 1 week - continue BB, consider dose increase  Plan - d/c once meds reviewed. Early f/u in office. CHF RN is off today. MD advise on f/u in CHF clinic.   Jonetta Speak , PA-C 11:07 AM 03/25/2015

## 2015-03-25 NOTE — Progress Notes (Signed)
Pt a/o, no c/o pain, pt being dc to home with wife, vss, pt stable

## 2015-03-25 NOTE — Discharge Summary (Signed)
CARDIOLOGY DISCHARGE SUMMARY   Patient ID: Chad Brown MRN: [id number] DOB/AGE: 1940-10-04 74 y.o.  Admit date: 03/21/2015 Discharge date: 03/25/2015  PCP: Sherrie Mustache, MD Primary Cardiologist: Dr. Johnsie Cancel Electrophysiologist: Dr. Rayann Heman  Primary Discharge Diagnosis:  Acute on chronic systolic CHF - weight 123456 at discharge Secondary Discharge Diagnosis:    Hyperlipidemia   Hypertension   Nonischemic cardiomyopathy   Obesity   Atrial fibrillation with RVR  Procedures: Transesophageal echocardiogram, direct current cardioversion,  Hospital Course: Chad Brown is a 74 y.o. male with a history of chronic systolic CHF, PAF, on Xarelto, melanoma, and recent biventricular ICD placement on 03/18/2015. He came to the hospital on the day of admission with a weight gain of 7 pounds, progressive shortness of breath and decreased responsiveness to Lasix. He was found to be in atrial fibrillation with rapid ventricular response. He was started on a diltiazem drip and admitted for further evaluation and treatment.  He was initially started on Lasix 40 mg IV twice a day but the dose was not high enough so he was increased to Lasix 80 mg IV twice a day. However, his creatinine increased and he was put back on Lasix 40 mg IV twice a day. By 03/24/2015, his volume status had improved and he was switched back to oral Lasix. Initially, he was put back on his home dose but it was felt this was not sufficient and he was increased on Lasix 20mg  every other day alternating with  Lasix 40 mg every other day. Because of his renal sufficiency, this will have to be followed closely as an outpatient. Get a BMET checked on Monday.  Because of his left ventricular dysfunction, Cardizem is suboptimal for rate control. He had been on amiodarone prior to admission but an amiodarone level was checked and was subtherapeutic. His dose was increased from 200 mg daily up to 200 mg twice a day. He is to  continue on that dose for a week after discharge and then cut back to 300 mg daily. Device was interrogated and showed that he had been in atrial fibrillation since 03/17/2015. His beta blocker was increased from 12.5 mg twice a day up to 18.75 mg twice a day.  ChadsVasc is 4 and he is to be continuously anticoagulated with Xarelto.  After he had been on an increased dose of amiodarone, a TEE cardioversion was performed. The results of the TEE are below. No thrombus was noted and the cardioversion was successful. However, his EF was lower than previously seen. Because of his worsening renal function and his blood pressure, his ARB is on hold for now. Hopefully it can be restarted as an outpatient.  He was started on Hydralazine 10mg  TID for afterload reduction since his ARB had to be stopped.  On 03/25/2015, he was seen by Dr. Radford Pax and all data were reviewed. Medication adjustments were finalized. Chad Brown is to get a BMET on Monday at Dr. Murrell Redden office and follow-up in our office at the end of the week. No further inpatient workup is indicated and he is considered stable for discharge, to follow-up as an outpatient.  Labs:   Lab Results  Component Value Date   WBC 6.6 03/23/2015   HGB 11.3* 03/23/2015   HCT 35.8* 03/23/2015   MCV 87.5 03/23/2015   PLT 140* 03/23/2015     Recent Labs Lab 03/24/15 0347  NA 143  K 3.7  CL 100*  CO2 35*  BUN 18  CREATININE  1.36*  CALCIUM 8.4*  GLUCOSE 70   Lipid Panel     Component Value Date/Time   CHOL 200 03/22/2015 0536   TRIG 105 03/22/2015 0536   HDL 26* 03/22/2015 0536   CHOLHDL 7.7 03/22/2015 0536   VLDL 21 03/22/2015 0536   LDLCALC 153* 03/22/2015 0536    B NATRIURETIC PEPTIDE  Date/Time Value Ref Range Status  03/21/2015 01:10 AM 968.4* 0.0 - 100.0 pg/mL Final  10/02/2014 10:01 AM 1005.2* 0.0 - 100.0 pg/mL Final    Comment:    Please note change in reference range.     Radiology: Dg Chest 2 View  03/21/2015   CLINICAL  DATA:  Acute onset of shortness of breath and throat tightness. Dizziness. Initial encounter.  EXAM: CHEST  2 VIEW  COMPARISON:  Chest radiograph performed 03/09/2015  FINDINGS: The lungs are well-aerated. Vascular congestion is noted. Mild bibasilar opacities could reflect minimal interstitial edema. No pleural effusion or pneumothorax is seen.  The heart is borderline normal in size. A pacemaker/AICD is noted at the left chest wall, with leads ending at the right atrium, right ventricle and coronary sinus. No acute osseous abnormalities are seen.  IMPRESSION: Vascular congestion noted. Mild bibasilar opacities could reflect minimal interstitial edema.   Electronically Signed   By: Garald Balding M.D.   On: 03/21/2015 03:00   Dg Chest 2 View  03/09/2015   CLINICAL DATA:  Status post pacemaker placement  EXAM: CHEST  2 VIEW  COMPARISON:  Chest x-ray of October 02, 2014  FINDINGS: There has been interval placement of a permanent pacemaker defibrillator. Positioning of the generator and electrodes is radiographically good. The cardiac silhouette remains enlarged. The pulmonary vascularity is normal. There is no pleural effusion. No in infiltrate or atelectasis is observed. The bony thorax is unremarkable.  IMPRESSION: There is no postprocedure complication following AICD placement. Stable cardiomegaly.   Electronically Signed   By: David  Martinique M.D.   On: 03/09/2015 07:44   EKG: 03/21/2015 Sinus rhythm, ventricular pacing  Echo: 03/23/2015 - Left ventricle: Systolic function was severely reduced. The estimated ejection fraction was in the range of 15% to 20%. - Aortic valve: No evidence of vegetation. There was trivial regurgitation. - Mitral valve: No evidence of vegetation. There was mild regurgitation directed eccentrically. - Left atrium: The atrium was dilated. No evidence of thrombus in the appendage. - Right atrium: No evidence of thrombus in the atrial cavity or appendage. - Atrial  septum: No defect or patent foramen ovale was identified. - Tricuspid valve: No evidence of vegetation. There was moderate regurgitation. - Pulmonic valve: No evidence of vegetation. Impressions: - Cardioversion successful.  FOLLOW UP PLANS AND APPOINTMENTS Allergies  Allergen Reactions  . Pravastatin Sodium Other (See Comments)    Joint and muscle pain  . Zithromax [Azithromycin] Diarrhea     Medication List    STOP taking these medications        losartan 100 MG tablet  Commonly known as:  COZAAR      TAKE these medications        acetaminophen 500 MG tablet  Commonly known as:  TYLENOL  Take 1,000 mg by mouth every 4 (four) hours as needed for moderate pain or headache.     amiodarone 200 MG tablet  Commonly known as:  PACERONE  Take 1 tablet (200 mg total) by mouth 2 (two) times daily. Take 1 tablet twice a day for one week, then 1-1/2 tablets daily     carvedilol  12.5 MG tablet  Commonly known as:  COREG  Take 1.5 tablets (18.75 mg total) by mouth 2 (two) times daily with a meal.     dorzolamide-timolol 22.3-6.8 MG/ML ophthalmic solution  Commonly known as:  COSOPT  Place 1 drop into the right eye 2 (two) times daily.     ezetimibe 10 MG tablet  Commonly known as:  ZETIA  Take 1 tablet (10 mg total) by mouth daily.     finasteride 5 MG tablet  Commonly known as:  PROSCAR  Take 5 mg by mouth every morning.     furosemide 20 MG tablet  Commonly known as:  LASIX  Take 1-2 tablets (20-40 mg total) by mouth daily. Alternate 1 tablet with 2 tablets daily. Start with 2 tablets on 03/26/2015.     gabapentin 100 MG capsule  Commonly known as:  NEURONTIN  Take 200-300 mg by mouth See admin instructions. Take 2 capsules by mouth in the morning and 3 capsules by mouth at night     hydrALAZINE 10 MG tablet  Commonly known as:  APRESOLINE  Take 1 tablet (10 mg total) by mouth 3 (three) times daily.     Iron 325 (65 FE) MG Tabs  Take 325 mg by mouth 3 (three)  times daily.     lipase/protease/amylase 36000 UNITS Cpep capsule  Commonly known as:  CREON  Take 1 capsule (36,000 Units total) by mouth 3 (three) times daily before meals. And with snacks     NIASPAN 500 MG CR tablet  Generic drug:  niacin  Take 1,000 mg by mouth at bedtime.     potassium chloride SA 20 MEQ tablet  Commonly known as:  K-DUR,KLOR-CON  Take 1 tablet (20 mEq total) by mouth every morning. Every time you take 2 furosemide/Lasix tablets, take 2 potassium tablets     rivaroxaban 20 MG Tabs tablet  Commonly known as:  XARELTO  Take 1 tablet (20 mg total) by mouth daily with supper. Restart tomorrow 02/11/2015     SYSTANE ULTRA OP  Place 1 drop into the left eye 2 (two) times daily.     tamsulosin 0.4 MG Caps capsule  Commonly known as:  FLOMAX  Take 0.4 mg by mouth at bedtime.     traMADol 50 MG tablet  Commonly known as:  ULTRAM  Take 1 tablet (50 mg total) by mouth every 8 (eight) hours.        Discharge Instructions    (HEART FAILURE PATIENTS) Call MD:  Anytime you have any of the following symptoms: 1) 3 pound weight gain in 24 hours or 5 pounds in 1 week 2) shortness of breath, with or without a dry hacking cough 3) swelling in the hands, feet or stomach 4) if you have to sleep on extra pillows at night in order to breathe.    Complete by:  As directed      Diet - low sodium heart healthy    Complete by:  As directed      Increase activity slowly    Complete by:  As directed           Follow-up Information    Follow up with Patsey Berthold, NP On 03/30/2015.   Specialty:  Nurse Practitioner   Why:  1:30 PM - Dr. Jackalyn Lombard Nurse Practitioner.   Contact information:   [place] [id number] [phone]       BRING ALL MEDICATIONS WITH YOU TO FOLLOW UP APPOINTMENTS  Time spent with patient to include physician time: 48 min Signed: Rosaria Ferries, PA-C 03/25/2015, 3:13 PM Co-Sign MD  Agree with above discharge summary with minor  changes.  ARB stopped due to renal insuff.  Hydralazine started for afterload reduction.  If renal function stable as outpt may restart ARB.  Lasix increased to 40mg  QOD alternating with 20mg  QOD.  Will get BMET on Monday.  Decrease Amio to 300mg  daily after 1 week.  Fransico Him, MD [facility] 03/25/2015

## 2015-03-28 ENCOUNTER — Telehealth: Payer: Self-pay | Admitting: *Deleted

## 2015-03-28 ENCOUNTER — Other Ambulatory Visit: Payer: Medicare Other

## 2015-03-28 DIAGNOSIS — N289 Disorder of kidney and ureter, unspecified: Secondary | ICD-10-CM

## 2015-03-28 NOTE — Telephone Encounter (Deleted)
-----   Message from Kendell Bane sent at 03/17/2015 10:22 AM EDT ----- Regarding: Chad Brown,  Chad Brown has stopped taking his Losartan for the past two days. He states his BP has dropped too much since Dr. Johnsie Cancel doubled his dose in Jan/2016.  Chad Brown would like a call clarifying his dosage.  I checked his new device today. All diagnostics normal.   Thanks, Juanda Crumble

## 2015-03-28 NOTE — Telephone Encounter (Signed)
-----   Message from Kendell Bane sent at 03/17/2015 10:22 AM EDT ----- Regarding: [place],  Mr. Brucker has stopped taking his Losartan for the past two days. He states his BP has dropped too much since Dr. Johnsie Cancel doubled his dose in Jan/2016.  Mr. Derogatis would like a call clarifying his dosage.  I checked his new device today. All diagnostics normal.   Thanks, Juanda Crumble

## 2015-03-28 NOTE — Telephone Encounter (Signed)
SPOKE  WITH PT    [place]  STOPPED AND   HYDRALAZINE   10 MG  TID  WAS  STARTED   PT  THOUGHT  B/P  WAS  HIGHER ON DAY   HE ONLY TOOK  1  LASIX  BUT  FOUND  THAT   B/P   TODAY WAS  GOOD  WILL  CONTINUE  TO MONITOR   IF  NOTES  B/P  CREEPING  BACK UP WILL  CALL  OFFICE   .Adonis Housekeeper

## 2015-03-29 LAB — BMP8+EGFR
BUN / CREAT RATIO: 12 (ref 10–22)
BUN: 13 mg/dL (ref 8–27)
CO2: 27 mmol/L (ref 18–29)
Calcium: 9 mg/dL (ref 8.6–10.2)
Chloride: 103 mmol/L (ref 97–108)
Creatinine, Ser: 1.07 mg/dL (ref 0.76–1.27)
GFR calc non Af Amer: 68 mL/min/{1.73_m2} (ref >59)
GFR, EST AFRICAN AMERICAN: 79 mL/min/{1.73_m2} (ref >59)
Glucose: 105 mg/dL — ABNORMAL HIGH (ref 65–99)
Potassium: 4.8 mmol/L (ref 3.5–5.2)
Sodium: 145 mmol/L — ABNORMAL HIGH (ref 134–144)

## 2015-03-30 ENCOUNTER — Encounter: Payer: Self-pay | Admitting: Nurse Practitioner

## 2015-03-30 ENCOUNTER — Ambulatory Visit (INDEPENDENT_AMBULATORY_CARE_PROVIDER_SITE_OTHER): Payer: Medicare Other | Admitting: Nurse Practitioner

## 2015-03-30 ENCOUNTER — Encounter: Payer: Self-pay | Admitting: Internal Medicine

## 2015-03-30 VITALS — BP 140/80 | HR 61 | Ht 66.5 in | Wt 200.4 lb

## 2015-03-30 DIAGNOSIS — C439 Malignant melanoma of skin, unspecified: Secondary | ICD-10-CM

## 2015-03-30 DIAGNOSIS — I481 Persistent atrial fibrillation: Secondary | ICD-10-CM | POA: Diagnosis not present

## 2015-03-30 DIAGNOSIS — D649 Anemia, unspecified: Secondary | ICD-10-CM

## 2015-03-30 DIAGNOSIS — I5022 Chronic systolic (congestive) heart failure: Secondary | ICD-10-CM | POA: Diagnosis not present

## 2015-03-30 DIAGNOSIS — I10 Essential (primary) hypertension: Secondary | ICD-10-CM

## 2015-03-30 DIAGNOSIS — I429 Cardiomyopathy, unspecified: Secondary | ICD-10-CM | POA: Diagnosis not present

## 2015-03-30 DIAGNOSIS — I5023 Acute on chronic systolic (congestive) heart failure: Secondary | ICD-10-CM

## 2015-03-30 DIAGNOSIS — I428 Other cardiomyopathies: Secondary | ICD-10-CM

## 2015-03-30 DIAGNOSIS — I4819 Other persistent atrial fibrillation: Secondary | ICD-10-CM

## 2015-03-30 MED ORDER — LOSARTAN POTASSIUM 50 MG PO TABS: 50.0000 mg | ORAL_TABLET | Freq: Every day | ORAL | Status: DC

## 2015-03-30 MED ORDER — AMIODARONE HCL 200 MG PO TABS: ORAL_TABLET | ORAL | Status: DC

## 2015-03-30 NOTE — Patient Instructions (Addendum)
Medication Instructions:  Your physician has recommended you make the following change in your medication:  1. RESTART Losartan 50mg  take one by mouth daily  Labwork: Your physician recommends that you return for lab work: 04/08/2015 (BMP) --order given to the pt to have this done at [facility]  Testing/Procedures: No new orders.   Follow-Up: You have been referred to [facility] clinic.  A nurse will contact you in 4 WEEKS by phone.   Any Other Special Instructions Will Be Listed Below (If Applicable).  Please contact the office if your heart rate is greater than 90. This could mean that you are in Atrial Fibrillation.

## 2015-03-30 NOTE — Addendum Note (Signed)
Addended by: Barkley Boards on: 03/30/2015 02:17 PM   Modules accepted: Orders

## 2015-03-30 NOTE — Progress Notes (Addendum)
Electrophysiology Office Note Date: 03/30/2015  ID:  Chad Brown, DOB [birth year], MRN [id number]  PCP: Redge Gainer, MD Primary Cardiologist: Johnsie Cancel Electrophysiologist: Allred  CC: Post hospitalization follow up for AF  Chad Brown is a 74 y.o. male seen today for Dr Rayann Heman.  He was admitted 03/21/15 with worsening shortness of breath and a 7 pound weight gain and was found to be in AF with RVR.  His amiodarone was increased and he underwent cardioversion prior to discharge.  His ARB was held 2/2 acute on chronic renal insufficiency and he was scheduled today for hospitalization follow up.  Since discharge, the patient reports doing very well. He denies chest pain, palpitations, dyspnea, PND, orthopnea, nausea, vomiting, dizziness, syncope, edema, weight gain, or early satiety.  He has not had ICD shocks.   Device History: MDT CRTD implanted 2016 for NICM, CHF History of appropriate therapy: No History of AAD therapy: Yes - amiodarone for AF   Past Medical History  Diagnosis Date  . Hyperlipidemia   . Hypertension   . Chronic systolic CHF (congestive heart failure)     a. New dx 12/2012 ? NICM, may be r/t afib. b. Nuc 03/2013 - normal;  c. 03/2015 TEE EF 15-20%.  [name] PAF (paroxysmal atrial fibrillation)     a. Dx 12/2012, s/p TEE/DCCV 01/26/13. b. On Xarelto (CHA2DS2VASc = 3);  c. 03/2015 TEE (EF 15-20%, no LAA thrombus) and DCCV - amio increased to 200 mg bid.  . Anemia     iron deficient  . Cholelithiasis   . Atherosclerosis   . Colon polyp, hyperplastic 5/16    removed precancerous lesions  . AICD (automatic cardioverter/defibrillator) present 03/08/2015    MDT CRTD   . Chronic systolic CHF (congestive heart failure)   . Myocardial infarction 1998  . Arthritis     "about all my joints; hands, knees, back" (03/08/2015)  . Urinary hesitancy due to benign prostatic hypertrophy   . Melanoma of eye 2000's    "right; it's never been biopsied"  . Melanoma of lower leg 2015    "left; right at my knee"   Past Surgical History  Procedure Laterality Date  . Back surgery    . Total hip arthroplasty Right 06/2007  . Cataract extraction Right ~ 2006  . Glaucoma surgery Right ~ 2006    "put 3 stents in to drain fluid" (03/08/2015)  . Refractive surgery Right ~ 2006 X 2    "twice; both done at [place]" (03/08/2015  . Surgery scrotal / testicular Right 1990's  . Incision and drainage abscess posterior cervicalspine  05/2012  . Cardiac catheterization  1998  . Tee without cardioversion N/A 01/26/2013    Procedure: TRANSESOPHAGEAL ECHOCARDIOGRAM (TEE);  Surgeon: Lelon Perla, MD;  Location: [facility];  Service: Cardiovascular;  Laterality: N/A;  [name] anes. /   . Cardioversion N/A 01/26/2013    Procedure: CARDIOVERSION;  Surgeon: Lelon Perla, MD;  Location: [facility] ENDOSCOPY;  Service: Cardiovascular;  Laterality: N/A;  . Tee without cardioversion N/A 10/05/2014    Procedure: TRANSESOPHAGEAL ECHOCARDIOGRAM (TEE)  with cardioversion;  Surgeon: Thayer Headings, MD;  Location: [facility] ENDOSCOPY;  Service: Cardiovascular;  Laterality: N/A;  12:52 synched cardioversion at 120 joules,...afib to SR...12 lead EKG ordered.Marland KitchenMarland KitchenCardiozem d/c'ed per MD verbal order at [facility]  . Melanoma excision Left 2015    "lower leg; right at my knee"  . Colonoscopy with propofol N/A 02/10/2015    Procedure: COLONOSCOPY WITH PROPOFOL;  Surgeon: Gatha Mayer,  MD;  Location: [facility] ENDOSCOPY;  Service: Endoscopy;  Laterality: N/A;  . Joint replacement    . Thoracic spine surgery  03/2000    "ground calcium deposits from upper thoracic" (01/26/2013)  . Ep implantable device N/A 03/08/2015    Procedure: BiV ICD Insertion CRT-D;  Surgeon: Thompson Grayer, MD; MDT CRTD    . Cardioversion N/A 03/23/2015    Procedure: CARDIOVERSION;  Surgeon: Jerline Pain, MD;  Location: [facility];  Service: Cardiovascular;  Laterality: N/A;  . Tee without cardioversion N/A 03/23/2015    Procedure: TRANSESOPHAGEAL ECHOCARDIOGRAM (TEE);   Surgeon: Jerline Pain, MD;  Location: [facility] ENDOSCOPY;  Service: Cardiovascular;  Laterality: N/A;    Current Outpatient Prescriptions  Medication Sig Dispense Refill  . acetaminophen (TYLENOL) 500 MG tablet Take 1,000 mg by mouth every 4 (four) hours as needed for moderate pain or headache.    [name] amiodarone (PACERONE) 200 MG tablet Take 1 tablet (200 mg total) by mouth 2 (two) times daily. Take 1 tablet twice a day for one week, then 1-1/2 tablets daily 55 tablet 11  . carvedilol (COREG) 12.5 MG tablet Take 1.5 tablets (18.75 mg total) by mouth 2 (two) times daily with a meal. 90 tablet 11  . dorzolamide-timolol (COSOPT) 22.3-6.8 MG/ML ophthalmic solution Place 1 drop into the right eye 2 (two) times daily.    [name] ezetimibe (ZETIA) 10 MG tablet Take 1 tablet (10 mg total) by mouth daily. 30 tablet 11  . Ferrous Sulfate (IRON) 325 (65 FE) MG TABS Take 325 mg by mouth 3 (three) times daily.    . finasteride (PROSCAR) 5 MG tablet Take 5 mg by mouth every morning.     . furosemide (LASIX) 20 MG tablet Take 1-2 tablets (20-40 mg total) by mouth daily. Alternate 1 tablet with 2 tablets daily. Start with 2 tablets on 03/26/2015. 90 tablet 2  . gabapentin (NEURONTIN) 100 MG capsule Take 200-300 mg by mouth See admin instructions. Take 2 capsules by mouth in the morning and 3 capsules by mouth at night    . hydrALAZINE (APRESOLINE) 10 MG tablet Take 1 tablet (10 mg total) by mouth 3 (three) times daily. 90 tablet 11  . lipase/protease/amylase (CREON) 36000 UNITS CPEP capsule Take 1 capsule (36,000 Units total) by mouth 3 (three) times daily before meals. And with snacks (Patient taking differently: Take 36,000 Units by mouth 2 (two) times daily. And with snacks) 150 capsule 1  . NIASPAN 500 MG CR tablet Take 1,000 mg by mouth at bedtime.     [name] Glycol-Propyl Glycol (SYSTANE ULTRA OP) Place 1 drop into the left eye 2 (two) times daily.     . potassium chloride SA (K-DUR,KLOR-CON) 20 MEQ tablet Take 1  tablet (20 mEq total) by mouth every morning. Every time you take 2 furosemide/Lasix tablets, take 2 potassium tablets 45 tablet 11  . rivaroxaban (XARELTO) 20 MG TABS tablet Take 1 tablet (20 mg total) by mouth daily with supper. Restart tomorrow 02/11/2015 90 tablet 2  . tamsulosin (FLOMAX) 0.4 MG CAPS Take 0.4 mg by mouth at bedtime.     . traMADol (ULTRAM) 50 MG tablet Take 1 tablet (50 mg total) by mouth every 8 (eight) hours. (Patient taking differently: Take 50 mg by mouth every 8 (eight) hours as needed (pain). ) 90 tablet 1   No current facility-administered medications for this visit.    Allergies:   Pravastatin sodium and Zithromax   Social History: History   Social History  .  Marital Status: Married    Spouse Name: N/A  . Number of Children: 3  . Years of Education: N/A   Occupational History  . retired    Social History Main Topics  . Smoking status: Former Smoker -- 3.00 packs/day for 48 years    Types: Cigarettes    Quit date: 09/28/2000  . Smokeless tobacco: Never Used  . Alcohol Use: No  . Drug Use: No  . Sexual Activity: Yes   Other Topics Concern  . Not on file   Social History Narrative   Pt lives in [place] with spouse.  3 children are grown and healthy.   Retired.  Ran a country store for 30 years, previously worked in the [employer] for 16 years    Family History: Family History  Problem Relation Age of Onset  . Kidney disease Mother   . Heart disease Mother     MI, open heart  . Diabetes Mother     dialysis  . Leukemia Father   . Colon cancer Paternal Uncle   . Lung cancer Paternal Uncle     x 2  . Prostate cancer Paternal Uncle   . Diabetes Maternal Grandmother   . Heart attack Maternal Uncle   . Diabetes Maternal Aunt     x 3  . Diabetes Maternal Uncle     Review of Systems: All other systems reviewed and are otherwise negative except as noted above.   Physical Exam: VS:  BP 140/80 mmHg  Pulse 61  Ht 5' 6.5"  (1.689 m)  Wt 200 lb 6.4 oz (90.901 kg)  BMI 31.86 kg/m2 , BMI Body mass index is 31.86 kg/(m^2).  GEN- The patient is well appearing, alert and oriented x 3 today.   HEENT: normocephalic, atraumatic; sclera clear, conjunctiva pink; hearing intact; oropharynx clear; neck supple  Lungs- Clear to ausculation bilaterally, normal work of breathing.  No wheezes, rales, rhonchi Heart- Regular rate and rhythm (paced) GI- soft, non-tender, non-distended, bowel sounds present  Extremities- no clubbing, cyanosis, or edema; DP/PT/radial pulses 2+ bilaterally MS- no significant deformity or atrophy Skin- warm and dry, no rash or lesion; ICD pocket well healed Psych- euthymic mood, full affect Neuro- strength and sensation are intact  ICD interrogation- reviewed in detail today,  See PACEART report  EKG:  EKG is not ordered today  Recent Labs: 10/08/2014: Magnesium 2.3 10/29/2014: Pro B Natriuretic peptide (BNP) 385.0*; TSH 2.01 03/01/2015: ALT 21 03/21/2015: B Natriuretic Peptide 968.4* 03/23/2015: Hemoglobin 11.3*; Platelets 140* 03/28/2015: BUN 13; Creatinine, Ser 1.07; Potassium 4.8; Sodium 145*   Wt Readings from Last 3 Encounters:  03/30/15 200 lb 6.4 oz (90.901 kg)  03/25/15 197 lb 3.2 oz (89.449 kg)  03/09/15 202 lb 9.6 oz (91.9 kg)     Other studies Reviewed: Additional studies/ records that were reviewed today include: Dr Jackalyn Lombard office notes, hospital records  Assessment and Plan:  1.  Persistent atrial fibrillation He was admitted earlier this month with acute on chronic heart failure in the setting of AF with RVR.  He underwent TEE/DCCV and reports significant symptomatic improvement in symptoms.  Continue Xarelto for CHADS2VASC of 4 Decrease Amiodarone today to 300mg  daily; recent LFT's, TFT's normal; recommend annual eye exams  2.  Chronic systolic dysfunction euvolemic today Restart Losartan 50mg  daily Will enroll in [facility] clinic  3.  HTN BP elevated today Restart  Losartan 50mg  daily with repeat BMET 10-14 days - pt would like to have this drawn at [facility]  4.  Neuropathic pain He has been maintained on Neurontin as prescribed by Dr Edrick Oh with resolution of pain.  He asks today about discontinuing this medication.  Advised to follow up with PCP regarding recommendations   Current medicines are reviewed at length with the patient today.   The patient does not have concerns regarding his medicines.  The following changes were made today:  Restart Losartan 50mg  daily; decrease amiodarone to 300mg  daily  Labs/ tests ordered today include: BMET in 10-14 days  Disposition:   Follow up with Dr Johnsie Cancel and Dr Rayann Heman as scheduled; enroll in [facility] clinic.    Signed, Chanetta Marshall, NP 03/30/2015 1:58 PM  [place] [phone] (office) [phone] (fax)

## 2015-04-01 ENCOUNTER — Telehealth: Payer: Self-pay | Admitting: Internal Medicine

## 2015-04-01 NOTE — Telephone Encounter (Signed)
Patient notified of lab results. Patient verbalized understanding and agreement to continue with current treatment plan.

## 2015-04-01 NOTE — Telephone Encounter (Signed)
New Message   Pt calling in to speak to rn about Lad results    Please call pt back thank you

## 2015-04-14 ENCOUNTER — Ambulatory Visit (INDEPENDENT_AMBULATORY_CARE_PROVIDER_SITE_OTHER): Payer: Medicare Other | Admitting: Internal Medicine

## 2015-04-14 ENCOUNTER — Encounter: Payer: Self-pay | Admitting: Internal Medicine

## 2015-04-14 VITALS — BP 130/70 | HR 68 | Ht 64.75 in | Wt 200.1 lb

## 2015-04-14 DIAGNOSIS — K635 Polyp of colon: Secondary | ICD-10-CM

## 2015-04-14 DIAGNOSIS — R197 Diarrhea, unspecified: Secondary | ICD-10-CM | POA: Diagnosis not present

## 2015-04-14 DIAGNOSIS — K862 Cyst of pancreas: Secondary | ICD-10-CM | POA: Diagnosis not present

## 2015-04-14 MED ORDER — PANCRELIPASE (LIP-PROT-AMYL) 36000-114000 UNITS PO CPEP: 36000.0000 [IU] | ORAL_CAPSULE | ORAL | Status: DC | PRN

## 2015-04-14 NOTE — Patient Instructions (Signed)
   Glad to hear you are better. I think you can use the Creon as needed - perhaps not at all.  I will plan to see you next Spring to arrange a repeat colonoscopy.  I appreciate the opportunity to care for you. Gatha Mayer, MD, [facility]

## 2015-04-14 NOTE — Progress Notes (Signed)
Subjective:    Patient ID: Chad Brown, male    DOB: 08/25/1941, 74 y.o.   MRN: [id number] Chief complaint: Follow-up colon polyps and diarrhea HPI Patient returns for follow-up. He was having diarrhea problems, he had a history of colon polyps and had a colonoscopy in May with 5 polyps removed all adenomatous. I started him on Creon because he had a small pancreatic cystic lesion, and I wonder if he might have some pancreatic insufficiency. He took the Creon regularly with meals and snacks for about a week, his diarrhea seemed to resolve. He is only occasionally taking the Creon now and in general has done well and is even almost constipated at times. He says that in the past he would have loose stools particularly on his golf days on Tuesday and Thursday though he has not been back to golfing so he has not had that problem. He was fearful to leave the house because of loose bowel movements after breakfast. He is contemplating a trip to the [place], though he has not been driving much after his cardiac issues. So he is not sure if he will go next week with his son and his family. Allergies  Allergen Reactions  . Pravastatin Sodium Other (See Comments)    Joint and muscle pain  . Zithromax [Azithromycin] Diarrhea   Outpatient Prescriptions Prior to Visit  Medication Sig Dispense Refill  . acetaminophen (TYLENOL) 500 MG tablet Take 1,000 mg by mouth every 4 (four) hours as needed for moderate pain or headache.    [name] amiodarone (PACERONE) 200 MG tablet Take one and one-half tablet by mouth daily 45 tablet 11  . carvedilol (COREG) 12.5 MG tablet Take 1.5 tablets (18.75 mg total) by mouth 2 (two) times daily with a meal. 90 tablet 11  . dorzolamide-timolol (COSOPT) 22.3-6.8 MG/ML ophthalmic solution Place 1 drop into the right eye 2 (two) times daily.    [name] ezetimibe (ZETIA) 10 MG tablet Take 1 tablet (10 mg total) by mouth daily. 30 tablet 11  . finasteride (PROSCAR) 5 MG tablet Take 5 mg by mouth  every morning.     . furosemide (LASIX) 20 MG tablet Take 1-2 tablets (20-40 mg total) by mouth daily. Alternate 1 tablet with 2 tablets daily. Start with 2 tablets on 03/26/2015. 90 tablet 2  . gabapentin (NEURONTIN) 100 MG capsule Take 200-300 mg by mouth See admin instructions. Take 2 capsules by mouth in the morning and 3 capsules by mouth at night    . hydrALAZINE (APRESOLINE) 10 MG tablet Take 1 tablet (10 mg total) by mouth 3 (three) times daily. 90 tablet 11  . losartan (COZAAR) 50 MG tablet Take 1 tablet (50 mg total) by mouth daily. 30 tablet 11  . NIASPAN 500 MG CR tablet Take 1,000 mg by mouth at bedtime.     [name] Glycol-Propyl Glycol (SYSTANE ULTRA OP) Place 1 drop into the left eye 2 (two) times daily.     . potassium chloride SA (K-DUR,KLOR-CON) 20 MEQ tablet Take 1 tablet (20 mEq total) by mouth every morning. Every time you take 2 furosemide/Lasix tablets, take 2 potassium tablets 45 tablet 11  . rivaroxaban (XARELTO) 20 MG TABS tablet Take 1 tablet (20 mg total) by mouth daily with supper. Restart tomorrow 02/11/2015 90 tablet 2  . tamsulosin (FLOMAX) 0.4 MG CAPS Take 0.4 mg by mouth at bedtime.     . traMADol (ULTRAM) 50 MG tablet Take 1 tablet (50 mg total) by mouth  every 8 (eight) hours. (Patient taking differently: Take 50 mg by mouth as needed (pain). ) 90 tablet 1  . Ferrous Sulfate (IRON) 325 (65 FE) MG TABS Take 325 mg by mouth 3 (three) times daily.    . lipase/protease/amylase (CREON) 36000 UNITS CPEP capsule Take 1 capsule (36,000 Units total) by mouth 3 (three) times daily before meals. And with snacks (Patient taking differently: Take 36,000 Units by mouth as needed. And with snacks) 150 capsule 1   No facility-administered medications prior to visit.   Past Medical History  Diagnosis Date  . Hyperlipidemia   . Hypertension   . Chronic systolic CHF (congestive heart failure)     a. New dx 12/2012 ? NICM, may be r/t afib. b. Nuc 03/2013 - normal;  c. 03/2015 TEE  EF 15-20%.  [name] PAF (paroxysmal atrial fibrillation)     a. Dx 12/2012, s/p TEE/DCCV 01/26/13. b. On Xarelto (CHA2DS2VASc = 3);  c. 03/2015 TEE (EF 15-20%, no LAA thrombus) and DCCV - amio increased to 200 mg bid.  . Anemia     iron deficient  . Cholelithiasis   . Atherosclerosis   . Colon polyp, hyperplastic 5/16    removed precancerous lesions  . AICD (automatic cardioverter/defibrillator) present 03/08/2015    MDT CRTD   . Chronic systolic CHF (congestive heart failure)   . Myocardial infarction 1998  . Arthritis     "about all my joints; hands, knees, back" (03/08/2015)  . Urinary hesitancy due to benign prostatic hypertrophy   . Melanoma of eye 2000's    "right; it's never been biopsied"  . Melanoma of lower leg 2015    "left; right at my knee"  . Adenomatous colon polyp     tubular  . Diverticulosis    Past Surgical History  Procedure Laterality Date  . Back surgery    . Total hip arthroplasty Right 06/2007  . Cataract extraction Right ~ 2006  . Glaucoma surgery Right ~ 2006    "put 3 stents in to drain fluid" (03/08/2015)  . Refractive surgery Right ~ 2006 X 2    "twice; both done at [place]" (03/08/2015  . Surgery scrotal / testicular Right 1990's  . Incision and drainage abscess posterior cervicalspine  05/2012  . Cardiac catheterization  1998  . Tee without cardioversion N/A 01/26/2013    Procedure: TRANSESOPHAGEAL ECHOCARDIOGRAM (TEE);  Surgeon: Lelon Perla, MD;  Location: [facility];  Service: Cardiovascular;  Laterality: N/A;  [name] anes. /   . Cardioversion N/A 01/26/2013    Procedure: CARDIOVERSION;  Surgeon: Lelon Perla, MD;  Location: [facility] ENDOSCOPY;  Service: Cardiovascular;  Laterality: N/A;  . Tee without cardioversion N/A 10/05/2014    Procedure: TRANSESOPHAGEAL ECHOCARDIOGRAM (TEE)  with cardioversion;  Surgeon: Thayer Headings, MD;  Location: [facility] ENDOSCOPY;  Service: Cardiovascular;  Laterality: N/A;  12:52 synched cardioversion at 120 joules,...afib to SR...12  lead EKG ordered.Marland KitchenMarland KitchenCardiozem d/c'ed per MD verbal order at [place]  . Melanoma excision Left 2015    "lower leg; right at my knee"  . Colonoscopy with propofol N/A 02/10/2015    Procedure: COLONOSCOPY WITH PROPOFOL;  Surgeon: Gatha Mayer, MD;  Location: [facility] ENDOSCOPY;  Service: Endoscopy;  Laterality: N/A;  . Joint replacement    . Thoracic spine surgery  03/2000    "ground calcium deposits from upper thoracic" (01/26/2013)  . Ep implantable device N/A 03/08/2015    Procedure: BiV ICD Insertion CRT-D;  Surgeon: Thompson Grayer, MD; MDT CRTD    .  Cardioversion N/A 03/23/2015    Procedure: CARDIOVERSION;  Surgeon: Jerline Pain, MD;  Location: [place];  Service: Cardiovascular;  Laterality: N/A;  . Tee without cardioversion N/A 03/23/2015    Procedure: TRANSESOPHAGEAL ECHOCARDIOGRAM (TEE);  Surgeon: Jerline Pain, MD;  Location: [facility] ENDOSCOPY;  Service: Cardiovascular;  Laterality: N/A;   History   Social History  . Marital Status: Married    Spouse Name: N/A  . Number of Children: 3  . Years of Education: N/A   Occupational History  . retired    Social History Main Topics  . Smoking status: Former Smoker -- 3.00 packs/day for 48 years    Types: Cigarettes    Quit date: 09/28/2000  . Smokeless tobacco: Never Used  . Alcohol Use: No  . Drug Use: No  . Sexual Activity: Yes   Other Topics Concern  . None   Social History Narrative   Pt lives in [place] with spouse.  3 children are grown and healthy.   Retired.  Ran a country store for 30 years, previously worked in the [place] for 16 years     Review of Systems Since his colonoscopy he had A. fib with RVR, congestive heart failure, and has had a defibrillator placed. He is recovering and not getting stronger but not yet back to normal.    Objective:   Physical Exam BP 130/70 mmHg  Pulse 68  Ht 5' 4.75" (1.645 m)  Wt 200 lb 2 oz (90.776 kg)  BMI 33.55 kg/m2 Elderly white man in no acute distress         Assessment & Plan:   1. Colon polyps   2. Diarrhea   3. Pancreatic cyst    His diarrhea seems resolved. I wonder if it was not related to the large cecal polyp perhaps. I don't think it's related to pancreatic insufficiency because he does not need Creon with every meal to control the diarrhea. We'll plan to see him in the spring and consider repeating a colonoscopy to make sure this 25 mm adenomatous polyp was gone from the cecum. I don't needs any specific follow this pancreatic cyst because cross-sectional imaging from 2014-2016 using CT and MRI has shown stability.  15 minutes time spent with patient > half in counseling coordination of care  I appreciate the opportunity to care for this patient. CC: Sherrie Mustache, MD

## 2015-04-15 ENCOUNTER — Other Ambulatory Visit (INDEPENDENT_AMBULATORY_CARE_PROVIDER_SITE_OTHER): Payer: Medicare Other

## 2015-04-15 DIAGNOSIS — I4819 Other persistent atrial fibrillation: Secondary | ICD-10-CM

## 2015-04-15 DIAGNOSIS — I5022 Chronic systolic (congestive) heart failure: Secondary | ICD-10-CM

## 2015-04-15 DIAGNOSIS — I481 Persistent atrial fibrillation: Secondary | ICD-10-CM

## 2015-04-15 DIAGNOSIS — I10 Essential (primary) hypertension: Secondary | ICD-10-CM

## 2015-04-15 DIAGNOSIS — I428 Other cardiomyopathies: Secondary | ICD-10-CM

## 2015-04-16 LAB — BMP8+EGFR
BUN/Creatinine Ratio: 13 (ref 10–22)
BUN: 13 mg/dL (ref 8–27)
CO2: 29 mmol/L (ref 18–29)
Calcium: 8.6 mg/dL (ref 8.6–10.2)
Chloride: 101 mmol/L (ref 97–108)
Creatinine, Ser: 1 mg/dL (ref 0.76–1.27)
GFR calc Af Amer: 85 mL/min/1.73
GFR calc non Af Amer: 74 mL/min/1.73
Glucose: 100 mg/dL — ABNORMAL HIGH (ref 65–99)
Potassium: 4.9 mmol/L (ref 3.5–5.2)
Sodium: 144 mmol/L (ref 134–144)

## 2015-04-18 ENCOUNTER — Telehealth: Payer: Self-pay | Admitting: Cardiovascular Disease

## 2015-04-18 NOTE — Telephone Encounter (Signed)
SPOKE WITH PT  RE  MESSAGE   PT'S B/P  IS  RUNNING  HIGH  LATER IN  DAY   THIS  MORNING  IT  WAS  120/76 PER PT  READS  THIS  MOST MORNINGS   AS  DAY   PROGRESSES  B/P  BECOMES  HIGHER PT  WAS  TAKING HYDRALAZINE,CARVEDILOL  AND  LOSARTAN  IN AM  AND  ABOUT   1 HOUR  LATER  TAKING   FUROSEMIDE  INSTRUCTED PT  TO TRY AND TAKE  LOSARTAN  IN AFTERNOON WHEN TAKING  SECOND  HYDRALAZINE    PT  AGREES WILL TRY   CHANGING  TIME  AND  WILL KEEP  B/P LOG  AND   BRING  READINGS  TO  APPT  NEXT  WEEK WITH  DR Johnsie Cancel  WILL FORWARD TO DR Johnsie Cancel  FOR REVIEW .Adonis Housekeeper

## 2015-04-18 NOTE — Telephone Encounter (Signed)
New message     Pt returning Danielle's call.  Pt is also having issues with bloop pressure Pt c/o BP issue: STAT if pt c/o blurred vision, one-sided weakness or slurred speech  1. What are your last 5 BP readings? July 14th 150/90; 120/74; 143/86; 177/105  2. Are you having any other symptoms (ex. Dizziness, headache, blurred vision, passed out)? Headache and dizziness  3. What is your BP issue? B/p not staying constant.  Pt is taking losartan 50mg  in the a.m.   Please call to discuss.

## 2015-04-19 ENCOUNTER — Encounter (HOSPITAL_COMMUNITY): Payer: Medicare Other | Attending: Hematology & Oncology | Admitting: Hematology & Oncology

## 2015-04-19 ENCOUNTER — Encounter (HOSPITAL_COMMUNITY): Payer: Self-pay | Admitting: Hematology & Oncology

## 2015-04-19 ENCOUNTER — Encounter: Payer: Self-pay | Admitting: Internal Medicine

## 2015-04-19 VITALS — BP 180/91 | HR 58 | Temp 98.2°F | Resp 18 | Wt 198.0 lb

## 2015-04-19 DIAGNOSIS — E611 Iron deficiency: Secondary | ICD-10-CM

## 2015-04-19 DIAGNOSIS — K635 Polyp of colon: Secondary | ICD-10-CM

## 2015-04-19 DIAGNOSIS — F418 Other specified anxiety disorders: Secondary | ICD-10-CM

## 2015-04-19 DIAGNOSIS — K5790 Diverticulosis of intestine, part unspecified, without perforation or abscess without bleeding: Secondary | ICD-10-CM

## 2015-04-19 DIAGNOSIS — K862 Cyst of pancreas: Secondary | ICD-10-CM

## 2015-04-19 DIAGNOSIS — D696 Thrombocytopenia, unspecified: Secondary | ICD-10-CM

## 2015-04-19 DIAGNOSIS — R935 Abnormal findings on diagnostic imaging of other abdominal regions, including retroperitoneum: Secondary | ICD-10-CM | POA: Diagnosis not present

## 2015-04-19 DIAGNOSIS — C439 Malignant melanoma of skin, unspecified: Secondary | ICD-10-CM | POA: Insufficient documentation

## 2015-04-19 DIAGNOSIS — M545 Low back pain: Secondary | ICD-10-CM

## 2015-04-19 DIAGNOSIS — D649 Anemia, unspecified: Secondary | ICD-10-CM | POA: Insufficient documentation

## 2015-04-19 DIAGNOSIS — D509 Iron deficiency anemia, unspecified: Secondary | ICD-10-CM

## 2015-04-19 DIAGNOSIS — Z85820 Personal history of malignant melanoma of skin: Secondary | ICD-10-CM

## 2015-04-19 DIAGNOSIS — Z87891 Personal history of nicotine dependence: Secondary | ICD-10-CM

## 2015-04-19 LAB — COMPREHENSIVE METABOLIC PANEL
ALT: 41 U/L (ref 17–63)
AST: 26 U/L (ref 15–41)
Albumin: 3.7 g/dL (ref 3.5–5.0)
Alkaline Phosphatase: 88 U/L (ref 38–126)
Anion gap: 10 (ref 5–15)
BILIRUBIN TOTAL: 0.4 mg/dL (ref 0.3–1.2)
BUN: 12 mg/dL (ref 6–20)
CO2: 29 mmol/L (ref 22–32)
Calcium: 8.6 mg/dL — ABNORMAL LOW (ref 8.9–10.3)
Chloride: 103 mmol/L (ref 101–111)
Creatinine, Ser: 0.95 mg/dL (ref 0.61–1.24)
GFR calc Af Amer: >60 mL/min (ref >60)
GFR calc non Af Amer: >60 mL/min (ref >60)
Glucose, Bld: 133 mg/dL — ABNORMAL HIGH (ref 65–99)
POTASSIUM: 3.7 mmol/L (ref 3.5–5.1)
SODIUM: 142 mmol/L (ref 135–145)
Total Protein: 7 g/dL (ref 6.5–8.1)

## 2015-04-19 LAB — CBC WITH DIFFERENTIAL/PLATELET
Basophils Absolute: 0 10*3/uL (ref 0.0–0.1)
Basophils Relative: 0 % (ref 0–1)
Eosinophils Absolute: 0.1 10*3/uL (ref 0.0–0.7)
Eosinophils Relative: 2 % (ref 0–5)
HCT: 40 % (ref 39.0–52.0)
Hemoglobin: 13 g/dL (ref 13.0–17.0)
Lymphocytes Relative: 16 % (ref 12–46)
Lymphs Abs: 1 10*3/uL (ref 0.7–4.0)
MCH: 28.7 pg (ref 26.0–34.0)
MCHC: 32.5 g/dL (ref 30.0–36.0)
MCV: 88.3 fL (ref 78.0–100.0)
Monocytes Absolute: 0.4 10*3/uL (ref 0.1–1.0)
Monocytes Relative: 7 % (ref 3–12)
NEUTROS ABS: 4.5 10*3/uL (ref 1.7–7.7)
Neutrophils Relative %: 76 % (ref 43–77)
PLATELETS: 123 10*3/uL — AB (ref 150–400)
RBC: 4.53 MIL/uL (ref 4.22–5.81)
RDW: 16.4 % — ABNORMAL HIGH (ref 11.5–15.5)
WBC: 6 10*3/uL (ref 4.0–10.5)

## 2015-04-19 LAB — FERRITIN: Ferritin: 40 ng/mL (ref 24–336)

## 2015-04-19 LAB — FOLATE: Folate: 9 ng/mL (ref >5.9)

## 2015-04-19 MED ORDER — ESCITALOPRAM OXALATE 20 MG PO TABS: ORAL_TABLET | ORAL | Status: DC

## 2015-04-19 NOTE — Patient Instructions (Signed)
..  [place] at [facility] Discharge Instructions  RECOMMENDATIONS MADE BY THE CONSULTANT AND ANY TEST RESULTS WILL BE SENT TO YOUR REFERRING PHYSICIAN.  Seen and discussion with Dr. Whitney Muse Lab work done today and results will be called to you.   To have CT scan done.   Stop taking oral iron. Lexapro prescription phoned into pharmacy. Start Folate once a day. Recommend B complex vitamin.         Thank you for choosing [place] at [facility] to provide your oncology and hematology care.  To afford each patient quality time with our provider, please arrive at least 15 minutes before your scheduled appointment time.    You need to re-schedule your appointment should you arrive 10 or more minutes late.  We strive to give you quality time with our providers, and arriving late affects you and other patients whose appointments are after yours.  Also, if you no show three or more times for appointments you may be dismissed from the clinic at the providers discretion.     Again, thank you for choosing [facility].  Our hope is that these requests will decrease the amount of time that you wait before being seen by our physicians.       _____________________________________________________________  Should you have questions after your visit to [facility], please contact our office at [phone] between the hours of 8:30 a.m. and 4:30 p.m.  Voicemails left after 4:30 p.m. will not be returned until the following business day.  For prescription refill requests, have your pharmacy contact our office.

## 2015-04-19 NOTE — Progress Notes (Signed)
[facility] NOTE  Patient Care Team: Chipper Herb, MD as PCP - General [facility] Medicine)  [place] [id number]   CHIEF COMPLAINTS/PURPOSE OF CONSULTATION:  T2a N0 Melanoma of the L Thigh s/p WLE and sentinel node with Dr. Freddi Che Abnormal CT and MRI imaging of the abdomen Anemia  HISTORY OF PRESENTING ILLNESS:  Chad Brown 74 y.o. male is here because of a history of melanoma and abnormal imaging of the abdomen. He follows with Dr. Edrick Oh for his primary medical care.  He is also followed for an iridociliary lesion of the left eye which is longstanding that has been diagnosed as either a nevus vs.a slow growing melanoma.   He was seen by Dr. Freddi Che at [facility] for a T2a melanoma of the left thigh diagnosed in 06/2014. He underwent a WLE and sentinel node procedure on 06/18/2014 with negative sentinel nodes. He has an established follow-up schedule with Dr. Freddi Che and Dr. Nevada Crane his local dermatologist.  Because of the findings in his eye he underwent a CT of the abdomen and pelvis with contrast on September 5 of 2014. There was an abnormality noted along the ventral surface of the pancreatic head. Follow-up imaging was recommended. A borderline enlarged right external iliac lymph node was noted at 1.2 cm. Repeat CT imaging was done on 12/18/2013 and was stable.   On 01/03/2015 MRI of the Abdomen was obtained that showed a similar appearance of the small cystic lesion in the head of the pancreas and is considered benign. There were 2 additional small lesions 41mm and 58mm in the proximal body and proximal tail of the pancreas which were also felt to be benign and too small to have been adequately seen on CT. 1 year follow-up was recommended.  Because of his history of melanoma in the abnormal imaging findings he was referred to [place] for additional consultation. In addition the patient notes a history of anemia. Laboratory studies reveal an  anemia with a hemoglobin as low as 10.1, also noted is a mild thrombocytopenia. Folic acid and 123456 were both WNL, ferritin was mildly low at 55 ng/ml. //  The patient is present today and has not been doing well.  He does not like taking oral iron and is depressed.  His wife says he has no energy and just sleeps around all day.  She also thinks that he is stressed.  He admits to feeling like he wants to do nothing and it upsets him that he cannot do the things that he used to do.  Him and his wife had a trip planned for today however they are no longer going because the patient does not feel up to it anymore.  He is currently taking Xarelto and Neurotin (200 mg in the am, 300 mg in the pm) and questions if these are attributed to his mood.    He has had a defibrillator put in recently.  He has emotionally taken this in a positive manner. His blood pressure has been elevated during the afternoons lately.  He is currently trying to adjust his medications. He is supposed to be having B12 injections but has not been regular with this.  The patient sleeps well aside from a stiffness that occurs when he lays back.  He experiences this while on the examination table. He complains of spots that have began to occur on his face within the last year.  He has an  appointment with his dermatologist soon.    MEDICAL HISTORY:  Past Medical History  Diagnosis Date  . Hyperlipidemia   . Hypertension   . Chronic systolic CHF (congestive heart failure)     a. New dx 12/2012 ? NICM, may be r/t afib. b. Nuc 03/2013 - normal;  c. 03/2015 TEE EF 15-20%.  [name] PAF (paroxysmal atrial fibrillation)     a. Dx 12/2012, s/p TEE/DCCV 01/26/13. b. On Xarelto (CHA2DS2VASc = 3);  c. 03/2015 TEE (EF 15-20%, no LAA thrombus) and DCCV - amio increased to 200 mg bid.  . Anemia     iron deficient  . Cholelithiasis   . Atherosclerosis   . Colon polyp, hyperplastic 5/16    removed precancerous lesions  . AICD (automatic  cardioverter/defibrillator) present 03/08/2015    MDT CRTD   . Chronic systolic CHF (congestive heart failure)   . Myocardial infarction 1998  . Arthritis     "about all my joints; hands, knees, back" (03/08/2015)  . Urinary hesitancy due to benign prostatic hypertrophy   . Melanoma of eye 2000's    "right; it's never been biopsied"  . Melanoma of lower leg 2015    "left; right at my knee"  . Adenomatous colon polyp     tubular  . Diverticulosis     SURGICAL HISTORY: Past Surgical History  Procedure Laterality Date  . Back surgery    . Total hip arthroplasty Right 06/2007  . Cataract extraction Right ~ 2006  . Glaucoma surgery Right ~ 2006    "put 3 stents in to drain fluid" (03/08/2015)  . Refractive surgery Right ~ 2006 X 2    "twice; both done at [facility]" (03/08/2015  . Surgery scrotal / testicular Right 1990's  . Incision and drainage abscess posterior cervicalspine  05/2012  . Cardiac catheterization  1998  . Tee without cardioversion N/A 01/26/2013    Procedure: TRANSESOPHAGEAL ECHOCARDIOGRAM (TEE);  Surgeon: Lelon Perla, MD;  Location: [facility];  Service: Cardiovascular;  Laterality: N/A;  [name] anes. /   . Cardioversion N/A 01/26/2013    Procedure: CARDIOVERSION;  Surgeon: Lelon Perla, MD;  Location: [facility] ENDOSCOPY;  Service: Cardiovascular;  Laterality: N/A;  . Tee without cardioversion N/A 10/05/2014    Procedure: TRANSESOPHAGEAL ECHOCARDIOGRAM (TEE)  with cardioversion;  Surgeon: Thayer Headings, MD;  Location: [facility] ENDOSCOPY;  Service: Cardiovascular;  Laterality: N/A;  12:52 synched cardioversion at 120 joules,...afib to SR...12 lead EKG ordered.Marland KitchenMarland KitchenCardiozem d/c'ed per MD verbal order at [facility]  . Melanoma excision Left 2015    "lower leg; right at my knee"  . Colonoscopy with propofol N/A 02/10/2015    Procedure: COLONOSCOPY WITH PROPOFOL;  Surgeon: Gatha Mayer, MD;  Location: [facility] ENDOSCOPY;  Service: Endoscopy;  Laterality: N/A;  . Joint replacement    . Thoracic spine  surgery  03/2000    "ground calcium deposits from upper thoracic" (01/26/2013)  . Ep implantable device N/A 03/08/2015    Procedure: BiV ICD Insertion CRT-D;  Surgeon: Thompson Grayer, MD; MDT CRTD    . Cardioversion N/A 03/23/2015    Procedure: CARDIOVERSION;  Surgeon: Jerline Pain, MD;  Location: [facility];  Service: Cardiovascular;  Laterality: N/A;  . Tee without cardioversion N/A 03/23/2015    Procedure: TRANSESOPHAGEAL ECHOCARDIOGRAM (TEE);  Surgeon: Jerline Pain, MD;  Location: [facility] ENDOSCOPY;  Service: Cardiovascular;  Laterality: N/A;    SOCIAL HISTORY: History   Social History  . Marital Status: Married    Spouse Name: N/A  .  Number of Children: 3  . Years of Education: N/A   Occupational History  . retired    Social History Main Topics  . Smoking status: Former Smoker -- 3.00 packs/day for 48 years    Types: Cigarettes    Quit date: 09/28/2000  . Smokeless tobacco: Never Used  . Alcohol Use: No  . Drug Use: No  . Sexual Activity: Yes   Other Topics Concern  . Not on file   Social History Narrative   Pt lives in [place] with spouse.  3 children are grown and healthy.   Retired.  Ran a country store for 30 years, previously worked in the [employer] for 16 years   Married for 91 years Born in [employer]. He was a Scientist, water quality, Therapist, occupational, Radio broadcast assistant. 3 sons and 3 grandchildren  Quit smoking 14 years ago Plays golf.  FAMILY HISTORY: Family History  Problem Relation Age of Onset  . Kidney disease Mother   . Heart disease Mother     MI, open heart  . Diabetes Mother     dialysis  . Leukemia Father   . Colon cancer Paternal Uncle   . Lung cancer Paternal Uncle     x 2  . Prostate cancer Paternal Uncle   . Diabetes Maternal Grandmother   . Heart attack Maternal Uncle   . Diabetes Maternal Aunt     x 3  . Diabetes Maternal Uncle    indicated that his mother is deceased. He indicated that his  father is deceased.    Father passed away at 78 of leukemia  Mother passed away at 7 of diabetes, kidney disease and CHF.  1 brother and 2 sisters. Sister had throat cancer. Another sister had skin cancer.  ALLERGIES:  is allergic to pravastatin sodium and zithromax.  MEDICATIONS:  Current Outpatient Prescriptions  Medication Sig Dispense Refill  . acetaminophen (TYLENOL) 500 MG tablet Take 1,000 mg by mouth every 4 (four) hours as needed for moderate pain or headache.    [name] amiodarone (PACERONE) 200 MG tablet Take one and one-half tablet by mouth daily 45 tablet 11  . carvedilol (COREG) 12.5 MG tablet Take 1.5 tablets (18.75 mg total) by mouth 2 (two) times daily with a meal. 90 tablet 11  . dorzolamide-timolol (COSOPT) 22.3-6.8 MG/ML ophthalmic solution Place 1 drop into the right eye 2 (two) times daily.    [name] ezetimibe (ZETIA) 10 MG tablet Take 1 tablet (10 mg total) by mouth daily. 30 tablet 11  . furosemide (LASIX) 20 MG tablet Take 1-2 tablets (20-40 mg total) by mouth daily. Alternate 1 tablet with 2 tablets daily. Start with 2 tablets on 03/26/2015. 90 tablet 2  . gabapentin (NEURONTIN) 100 MG capsule Take 200-300 mg by mouth See admin instructions. Take 2 capsules by mouth in the morning and 3 capsules by mouth at night    . hydrALAZINE (APRESOLINE) 10 MG tablet Take 1 tablet (10 mg total) by mouth 3 (three) times daily. 90 tablet 11  . lipase/protease/amylase (CREON) 36000 UNITS CPEP capsule Take 1 capsule (36,000 Units total) by mouth as needed. And with snacks 150 capsule 1  . losartan (COZAAR) 50 MG tablet Take 1 tablet (50 mg total) by mouth daily. 30 tablet 11  . NIASPAN 500 MG CR tablet Take 1,000 mg by mouth at bedtime.     [name] Glycol-Propyl Glycol (SYSTANE ULTRA OP) Place 1 drop into the left eye 2 (  two) times daily.     . potassium chloride SA (K-DUR,KLOR-CON) 20 MEQ tablet Take 1 tablet (20 mEq total) by mouth every morning. Every time you take 2 furosemide/Lasix  tablets, take 2 potassium tablets 45 tablet 11  . rivaroxaban (XARELTO) 20 MG TABS tablet Take 1 tablet (20 mg total) by mouth daily with supper. Restart tomorrow 02/11/2015 90 tablet 2  . tamsulosin (FLOMAX) 0.4 MG CAPS Take 0.4 mg by mouth at bedtime.     . traMADol (ULTRAM) 50 MG tablet Take 1 tablet (50 mg total) by mouth every 8 (eight) hours. (Patient taking differently: Take 50 mg by mouth as needed (pain). ) 90 tablet 1  . escitalopram (LEXAPRO) 20 MG tablet Take 1/2 tablet by mouth daily for 7 days then increase to one tablet thereafter 30 tablet 4  . finasteride (PROSCAR) 5 MG tablet Take 5 mg by mouth every morning.      No current facility-administered medications for this visit.    Review of Systems  Constitutional: Negative for fever, chills, weight loss and malaise/fatigue.  HENT: Negative for congestion, hearing loss, nosebleeds, sore throat and tinnitus.   Eyes: Negative for blurred vision, double vision, pain and discharge.  Respiratory: Negative for cough, hemoptysis, sputum production, shortness of breath and wheezing.   Cardiovascular: Negative for chest pain, palpitations, claudication, leg swelling and PND. Recent pacemaker placement Gastrointestinal: Negative for heartburn, nausea, vomiting, abdominal pain, diarrhea, constipation, blood in stool and melena.  Genitourinary: Negative for frequency. Negative for dysuria, urgency and hematuria.  Musculoskeletal: Negative for myalgias, joint pain and falls. Positive for back pain Skin: Negative for itching and rash.  Neurological: Negative for dizziness, tingling, tremors, sensory change, speech change, focal weakness, seizures, loss of consciousness, weakness and headaches.  Endo/Heme/Allergies: Does not bruise/bleed easily.  Psychiatric/Behavioral: Negative for suicidal ideas, memory loss and substance abuse. The patient is not nervous/anxious and does not have insomnia.  Positive for depressed mood  14 point ROS was done  and is otherwise as detailed above or in HPI   PHYSICAL EXAMINATION: ECOG PERFORMANCE STATUS: 0 - Asymptomatic  Filed Vitals:   04/19/15 1400  BP: 180/91  Pulse: 58  Temp: 98.2 F (36.8 C)  Resp: 18   Filed Weights   04/19/15 1400  Weight: 198 lb (89.812 kg)     Physical Exam  Constitutional: He is oriented to person, place, and time and well-developed, well-nourished, and in no distress.  Positive for anxiety/depression. HENT:  Head: Normocephalic and atraumatic.  Nose: Nose normal.  Mouth/Throat: Oropharynx is clear and moist. No oropharyngeal exudate.  Eyes: Conjunctivae and EOM are normal. Pupils are equal, round, and reactive to light. Right eye exhibits no discharge. Left eye exhibits no discharge. No scleral icterus.  Lower portion of right iris has abnormal shape and color  Neck: Normal range of motion. Neck supple. No tracheal deviation present. No thyromegaly present.  Cardiovascular: Normal rate, regular rhythm and normal heart sounds.  Exam reveals no gallop and no friction rub.   No murmur heard. Pulmonary/Chest: Effort normal and breath sounds normal. He has no wheezes. He has no rales.  Abdominal: Soft. Bowel sounds are normal. He exhibits no distension and no mass. There is no tenderness. There is no rebound and no guarding.  Musculoskeletal: Normal range of motion. He exhibits no edema.  On Inferior aspect of his incision the left thigh there is an area of palpable firmness. (patient notes this is chronic) Remainder of surgical site is without abnormality. No  other palpable nodules noted. L groin without palpable abnormality  Lymphadenopathy:    He has no cervical adenopathy.  Neurological: He is alert and oriented to person, place, and time. He has normal reflexes. No cranial nerve deficit. Gait normal. Coordination normal.  Skin: Skin is warm and dry. No rash noted.  Psychiatric: Mood, memory, affect and judgment normal.  Nursing note and vitals  reviewed.   LABORATORY DATA:  I have reviewed the data as listed Lab Results  Component Value Date   WBC 6.0 04/19/2015   HGB 13.0 04/19/2015   HCT 40.0 04/19/2015   MCV 88.3 04/19/2015   PLT 123* 04/19/2015    RADIOGRAPHIC STUDIES: I have personally reviewed the radiological images as listed and agreed with the findings in the report.   EXAM: MRI ABDOMEN WITHOUT AND WITH CONTRAST  TECHNIQUE: Multiplanar multisequence MR imaging of the abdomen was performed both before and after the administration of intravenous contrast.  CONTRAST: 80mL MULTIHANCE GADOBENATE DIMEGLUMINE 529 MG/ML IV SOLN  COMPARISON: CT of the abdomen and pelvis 12/18/2013.  FINDINGS: Lower chest: Mild cardiomegaly.  Hepatobiliary: Several tiny sub cm T1 hypointense, T2 hyperintense, nonenhancing liver lesions are noted, which demonstrate no enhancement on post gadolinium images, compatible with tiny cysts. MRCP images demonstrate no intra or extrahepatic biliary ductal dilatation. Common bile duct measures 4 mm in the porta hepatis. 9 mm filling defect in the gallbladder, compatible with a gallstone. No current findings to suggest an acute cholecystitis at this time.  Pancreas: Again noted in the anterior aspect of the head of the pancreas there is a small 15 x 11 mm lesion that is low T1 signal intensity, high T2 signal intensity, and does not enhance, stable in size compared to prior examinations dating back to 06/05/2013, most compatible with a benign lesion such as a small pancreatic pseudocyst. No definite association between this lesion and the pancreatic duct. No pancreatic ductal dilatation. Additional 2 mm lesion and 4 mm lesion with similar imaging characteristics in the proximal body and proximal tail of the pancreas also noted on images 14 and 15 of series 14. No suspicious-appearing pancreatic mass.  Spleen: Unremarkable.  Adrenals/Urinary Tract: Multiple T1 hypointense,  T2 hyperintense, nonenhancing lesions in the kidneys bilaterally, compatible with simple cysts, the largest of which extend exophytic lesion off the posterior aspect of the interpolar region of the left kidney measuring up to 5.7 x 5.1 cm.  Stomach/Bowel: Visualized portions are unremarkable.  Vascular/Lymphatic: Atherosclerotic disease in the visualized vasculature, without definite aneurysm. No lymphadenopathy noted in the visualized portions of the abdomen.  Other: Trace volume of perihepatic ascites.  Musculoskeletal: No aggressive appearing lesions noted in the visualized portions of the skeleton.  IMPRESSION: 1. Similar appearance of a small cystic lesion in the head of the pancreas anteriorly which has been stable in size compared to remote prior study from 06/05/2013, considered benign, presumably a small pancreatic pseudocyst. 2. Today's study also demonstrates 2 mm and 4 mm lesions in the proximal body and proximal tail of the pancreas as well, which also appear to be tiny cystic lesions. These are also strongly favored to be benign, but are unfortunately not visualized on prior CT examinations (likely too small to be discriminated accurately by CT). These could be followed with an additional 1 year MRI of the abdomen with and without IV gadolinium to confirm stability if clinically appropriate. 3. Cholelithiasis without evidence to suggest acute cholecystitis at this time. 4. Multiple simple cysts in the kidneys bilaterally, as  above. 5. Atherosclerosis. 6. Mild cardiomegaly.   Electronically Signed  By: Vinnie Langton M.D.  On: 01/04/2015 08:41  ASSESSMENT & PLAN:  T2a N0 Melanoma of the L Thigh s/p WLE and sentinel node with Dr. Freddi Che Abnormal CT and MRI imaging of the abdomen Anemia/Thrombocytopenia Colonoscopy 02/10/2015 with 5 polyps and sigmoid diverticulosis   His anemia is much improved, he is intolerant of oral iron. Although his anemia  is improved he still remains iron deficient and will require 1 dose of ferrlicet. We will arrange this for him. He is agreeable to IV iron as he feels he cannot tolerate the oral preparations. The most likely cause of his anemia is due to chronic blood loss/malabsorption syndrome. We discussed some of the risks, benefits, and alternatives of intravenous iron infusions. He tolerated oral iron supplement poorly and desires to achieved higher levels of iron faster for adequate hematopoesis. Some of the side-effects to be expected including risks of infusion reactions, phlebitis, headaches, nausea and fatigue.  The patient is willing to proceed. Patient education material was dispensed.  Goal is to keep ferritin level greater than or equal to 100 ng/ml  Thrombocytopenia is fairly stable. We will continue to monitor this.  On exam there is no obvious evidence of recurrence of his melanoma.  We will set him up for repeat abdominal imaging in the late fall to follow-up with the abnormalities noted on prior MRI and CT.  I have started him on Lexapro 10 mg daily increasing it to 20 mg after week. I advised the patient and his wife that it will take several weeks to notice if this improves his mood. I am hopeful that it will. He has had a very difficult year with 4 hospitalizations.  Orders Placed This Encounter  Procedures  . CT Abdomen Pelvis W Contrast    JH/AMY   [employer] MEDICARE     ISTAT CREAT IF NEEDED       NO READICAT PER MARY P./WILL DRINK WATER HERE    Standing Status: Future     Number of Occurrences:      Standing Expiration Date: 04/18/2016    Order Specific Question:  Reason for Exam (SYMPTOM  OR DIAGNOSIS REQUIRED)    Answer:  abnormal MRI abdomen, patient now has pacemaker,    Order Specific Question:  Preferred imaging location?    Answer:  [facility]  . CBC with Differential  . Comprehensive metabolic panel  . Ferritin  . Folate   All questions were answered. The patient  knows to call the clinic with any problems, questions or concerns.   This document serves as a record of services personally performed by Ancil Linsey, MD. It was created on her behalf by Janace Hoard, a trained medical scribe. The creation of this record is based on the scribe's personal observations and the provider's statements to them. This document has been checked and approved by the attending provider.    I have reviewed the above documentation for accuracy and completeness, and I agree with the above.  This note was electronically signed.  Kelby Fam. Whitney Muse, MD

## 2015-04-20 ENCOUNTER — Encounter (HOSPITAL_COMMUNITY): Payer: Self-pay | Admitting: Hematology & Oncology

## 2015-04-26 NOTE — Progress Notes (Signed)
Patient ID: Chad Brown, male   DOB: 15-Mar-1941, 74 y.o.   MRN: [id number]    Electrophysiology Office Note Date: 04/26/2015  ID:  Chad Brown, DOB Nov 16, 1940, MRN [id number]  PCP: Redge Gainer, MD Primary Cardiologist: Johnsie Cancel Electrophysiologist: Allred  CC: Post hospitalization follow up for AF  [name] is a 74 y.o. male  With PAF/DCM.  He was admitted 03/21/15 with worsening shortness of breath and a 7 pound weight gain and was found to be in AF with RVR.  His amiodarone was increased and he underwent cardioversion prior to discharge.  His ARB was held 2/2 acute on chronic renal insufficiency ARB restarted last month by PA  Amiodarone decreased to 300mg  dayly labs ok  On xaretlo for anticoagulation   Device History: MDT CRTD implanted 2016 for NICM, CHF History of appropriate therapy: No History of AAD therapy: Yes - amiodarone for AF  03/23/15  EF 15-20%  By TEE   Past Medical History  Diagnosis Date  . Hyperlipidemia   . Hypertension   . Chronic systolic CHF (congestive heart failure)     a. New dx 12/2012 ? NICM, may be r/t afib. b. Nuc 03/2013 - normal;  c. 03/2015 TEE EF 15-20%.  Chad Brown PAF (paroxysmal atrial fibrillation)     a. Dx 12/2012, s/p TEE/DCCV 01/26/13. b. On Xarelto (CHA2DS2VASc = 3);  c. 03/2015 TEE (EF 15-20%, no LAA thrombus) and DCCV - amio increased to 200 mg bid.  . Anemia     iron deficient  . Cholelithiasis   . Atherosclerosis   . Colon polyp, hyperplastic 5/16    removed precancerous lesions  . AICD (automatic cardioverter/defibrillator) present 03/08/2015    MDT CRTD   . Chronic systolic CHF (congestive heart failure)   . Myocardial infarction 1998  . Arthritis     "about all my joints; hands, knees, back" (03/08/2015)  . Urinary hesitancy due to benign prostatic hypertrophy   . Melanoma of eye 2000's    "right; it's never been biopsied"  . Melanoma of lower leg 2015    "left; right at my knee"  . Adenomatous colon polyp     tubular  .  Diverticulosis    Past Surgical History  Procedure Laterality Date  . Back surgery    . Total hip arthroplasty Right 06/2007  . Cataract extraction Right ~ 2006  . Glaucoma surgery Right ~ 2006    "put 3 stents in to drain fluid" (03/08/2015)  . Refractive surgery Right ~ 2006 X 2    "twice; both done at [place]" (03/08/2015  . Surgery scrotal / testicular Right 1990's  . Incision and drainage abscess posterior cervicalspine  05/2012  . Cardiac catheterization  1998  . Tee without cardioversion N/A 01/26/2013    Procedure: TRANSESOPHAGEAL ECHOCARDIOGRAM (TEE);  Surgeon: Lelon Perla, MD;  Location: [facility];  Service: Cardiovascular;  Laterality: N/A;  [name] anes. /   . Cardioversion N/A 01/26/2013    Procedure: CARDIOVERSION;  Surgeon: Lelon Perla, MD;  Location: [facility] ENDOSCOPY;  Service: Cardiovascular;  Laterality: N/A;  . Tee without cardioversion N/A 10/05/2014    Procedure: TRANSESOPHAGEAL ECHOCARDIOGRAM (TEE)  with cardioversion;  Surgeon: Thayer Headings, MD;  Location: [facility] ENDOSCOPY;  Service: Cardiovascular;  Laterality: N/A;  12:52 synched cardioversion at 120 joules,...afib to SR...12 lead EKG ordered.Chad KitchenMarland KitchenCardiozem d/c'ed per MD verbal order at [place]  . Melanoma excision Left 2015    "lower leg; right at my knee"  . Colonoscopy with propofol  N/A 02/10/2015    Procedure: COLONOSCOPY WITH PROPOFOL;  Surgeon: Gatha Mayer, MD;  Location: [facility] ENDOSCOPY;  Service: Endoscopy;  Laterality: N/A;  . Joint replacement    . Thoracic spine surgery  03/2000    "ground calcium deposits from upper thoracic" (01/26/2013)  . Ep implantable device N/A 03/08/2015    Procedure: BiV ICD Insertion CRT-D;  Surgeon: Thompson Grayer, MD; MDT CRTD    . Cardioversion N/A 03/23/2015    Procedure: CARDIOVERSION;  Surgeon: Jerline Pain, MD;  Location: [facility];  Service: Cardiovascular;  Laterality: N/A;  . Tee without cardioversion N/A 03/23/2015    Procedure: TRANSESOPHAGEAL ECHOCARDIOGRAM (TEE);  Surgeon: Jerline Pain, MD;  Location: [facility] ENDOSCOPY;  Service: Cardiovascular;  Laterality: N/A;    Current Outpatient Prescriptions  Medication Sig Dispense Refill  . acetaminophen (TYLENOL) 500 MG tablet Take 1,000 mg by mouth every 4 (four) hours as needed for moderate pain or headache.    Chad Brown amiodarone (PACERONE) 200 MG tablet Take one and one-half tablet by mouth daily 45 tablet 11  . carvedilol (COREG) 12.5 MG tablet Take 1.5 tablets (18.75 mg total) by mouth 2 (two) times daily with a meal. 90 tablet 11  . dorzolamide-timolol (COSOPT) 22.3-6.8 MG/ML ophthalmic solution Place 1 drop into the right eye 2 (two) times daily.    Chad Brown escitalopram (LEXAPRO) 20 MG tablet Take 1/2 tablet by mouth daily for 7 days then increase to one tablet thereafter 30 tablet 4  . ezetimibe (ZETIA) 10 MG tablet Take 1 tablet (10 mg total) by mouth daily. 30 tablet 11  . finasteride (PROSCAR) 5 MG tablet Take 5 mg by mouth every morning.     . furosemide (LASIX) 20 MG tablet Take 1-2 tablets (20-40 mg total) by mouth daily. Alternate 1 tablet with 2 tablets daily. Start with 2 tablets on 03/26/2015. 90 tablet 2  . gabapentin (NEURONTIN) 100 MG capsule Take 200-300 mg by mouth See admin instructions. Take 2 capsules by mouth in the morning and 3 capsules by mouth at night    . hydrALAZINE (APRESOLINE) 10 MG tablet Take 1 tablet (10 mg total) by mouth 3 (three) times daily. 90 tablet 11  . lipase/protease/amylase (CREON) 36000 UNITS CPEP capsule Take 1 capsule (36,000 Units total) by mouth as needed. And with snacks 150 capsule 1  . losartan (COZAAR) 50 MG tablet Take 1 tablet (50 mg total) by mouth daily. 30 tablet 11  . NIASPAN 500 MG CR tablet Take 1,000 mg by mouth at bedtime.     Chad Brown-Propyl Brown (SYSTANE ULTRA OP) Place 1 drop into the left eye 2 (two) times daily.     . potassium chloride SA (K-DUR,KLOR-CON) 20 MEQ tablet Take 1 tablet (20 mEq total) by mouth every morning. Every time you take 2  furosemide/Lasix tablets, take 2 potassium tablets 45 tablet 11  . rivaroxaban (XARELTO) 20 MG TABS tablet Take 1 tablet (20 mg total) by mouth daily with supper. Restart tomorrow 02/11/2015 90 tablet 2  . tamsulosin (FLOMAX) 0.4 MG CAPS Take 0.4 mg by mouth at bedtime.     . traMADol (ULTRAM) 50 MG tablet Take 1 tablet (50 mg total) by mouth every 8 (eight) hours. (Patient taking differently: Take 50 mg by mouth as needed (pain). ) 90 tablet 1   No current facility-administered medications for this visit.    Allergies:   Pravastatin sodium and Zithromax   Social History: History   Social History  . Marital Status:  Married    Spouse Name: N/A  . Number of Children: 3  . Years of Education: N/A   Occupational History  . retired    Social History Main Topics  . Smoking status: Former Smoker -- 3.00 packs/day for 48 years    Types: Cigarettes    Quit date: 09/28/2000  . Smokeless tobacco: Never Used  . Alcohol Use: No  . Drug Use: No  . Sexual Activity: Yes   Other Topics Concern  . Not on file   Social History Narrative   Pt lives in [place] with spouse.  3 children are grown and healthy.   Retired.  Ran a country store for 30 years, previously worked in the [employer] for 16 years    Family History: Family History  Problem Relation Age of Onset  . Kidney disease Mother   . Heart disease Mother     MI, open heart  . Diabetes Mother     dialysis  . Leukemia Father   . Colon cancer Paternal Uncle   . Lung cancer Paternal Uncle     x 2  . Prostate cancer Paternal Uncle   . Diabetes Maternal Grandmother   . Heart attack Maternal Uncle   . Diabetes Maternal Aunt     x 3  . Diabetes Maternal Uncle     Review of Systems: All other systems reviewed and are otherwise negative except as noted above.   Physical Exam: VS:  There were no vitals taken for this visit. , BMI There is no weight on file to calculate BMI.  GEN- The patient is well appearing,  alert and oriented x 3 today.   HEENT: normocephalic, atraumatic; sclera clear, conjunctiva pink; hearing intact; oropharynx clear; neck supple  Lungs- Clear to ausculation bilaterally, normal work of breathing.  No wheezes, rales, rhonchi Heart- Regular rate and rhythm (paced) GI- soft, non-tender, non-distended, bowel sounds present  Extremities- no clubbing, cyanosis, or edema; DP/PT/radial pulses 2+ bilaterally MS- no significant deformity or atrophy Skin- warm and dry, no rash or lesion; ICD pocket well healed Psych- euthymic mood, full affect Neuro- strength and sensation are intact  ICD interrogation- reviewed in detail today,  See PACEART report  EKG:  EKG  03/22/15  SR rate 91 LBBB PVC;s   Recent Labs: 10/08/2014: Magnesium 2.3 10/29/2014: Pro B Natriuretic peptide (BNP) 385.0*; TSH 2.01 03/21/2015: B Natriuretic Peptide 968.4* 04/19/2015: ALT 41; BUN 12; Creatinine, Ser 0.95; Hemoglobin 13.0; Platelets 123*; Potassium 3.7; Sodium 142   Wt Readings from Last 3 Encounters:  04/19/15 89.812 kg (198 lb)  04/14/15 90.776 kg (200 lb 2 oz)  03/30/15 90.901 kg (200 lb 6.4 oz)     Other studies Reviewed: Additional studies/ records that were reviewed today include: Dr Jackalyn Lombard office notes, hospital records  Assessment and Plan:  1.  Persistent atrial fibrillation He was admitted earlier this month with acute on chronic heart failure in the setting of AF with RVR.  He underwent TEE/DCCV and reports significant symptomatic improvement in symptoms.  Continue Xarelto for CHADS2VASC of 4 Continue amoidarone 300 mg daily   2.  Chronic systolic dysfunction euvolemic today Back on  Losartan 50mg  daily F/U ICM clinic  Will refer to CHF clinic ? Add aldactone.  EF 15-20% during TEE/DCC repeat echo in 3 months  3.  HTN Well controlled.  Continue current medications and low sodium Dash type diet.     4.  Neuropathic pain He has been maintained on Neurontin as  prescribed by Dr Edrick Oh  with resolution of pain.  He asks today about discontinuing this medication.  Advised to follow up with PCP regarding recommendations   Current medicines are reviewed at length with the patient today.   The patient does not have concerns regarding his medicines.  The following changes were made today:  Restart Losartan 50mg  daily; decrease amiodarone to 300mg  daily    Disposition:   Follow up with me in 6 months  FU with CHF clinic 3 months    [name]

## 2015-04-27 ENCOUNTER — Ambulatory Visit (INDEPENDENT_AMBULATORY_CARE_PROVIDER_SITE_OTHER): Payer: Medicare Other | Admitting: Cardiovascular Disease

## 2015-04-27 ENCOUNTER — Encounter: Payer: Self-pay | Admitting: Cardiovascular Disease

## 2015-04-27 VITALS — BP 150/90 | HR 64 | Ht 66.0 in | Wt 197.8 lb

## 2015-04-27 DIAGNOSIS — I5022 Chronic systolic (congestive) heart failure: Secondary | ICD-10-CM | POA: Diagnosis not present

## 2015-04-27 NOTE — Patient Instructions (Signed)
Medication Instructions:  NO CHANGES  Labwork: NONE  Testing/Procedures: NONE  Follow-Up: Your physician recommends that you schedule a follow-up appointment in: 2 MONTHS  WITH HEART FAILURE  CLINIC  AND  6 MONTHS  WITH  DR Johnsie Cancel  Any Other Special Instructions Will Be Listed Below (If Applicable).

## 2015-05-02 ENCOUNTER — Telehealth: Payer: Self-pay | Admitting: Internal Medicine

## 2015-05-02 ENCOUNTER — Ambulatory Visit (INDEPENDENT_AMBULATORY_CARE_PROVIDER_SITE_OTHER): Payer: Medicare Other | Admitting: *Deleted

## 2015-05-02 DIAGNOSIS — I5022 Chronic systolic (congestive) heart failure: Secondary | ICD-10-CM | POA: Diagnosis not present

## 2015-05-02 DIAGNOSIS — Z95810 Presence of automatic (implantable) cardiac defibrillator: Secondary | ICD-10-CM | POA: Diagnosis not present

## 2015-05-02 NOTE — Telephone Encounter (Signed)
Informed patient that remote was not received. Advised patient to resend. Patient voiced understanding.

## 2015-05-02 NOTE — Telephone Encounter (Signed)
Informed patient that remote was received. Patient voiced understanding. 

## 2015-05-02 NOTE — Telephone Encounter (Signed)
New message    Pt checking to see if transmission was received.   Please call to discuss

## 2015-05-03 ENCOUNTER — Encounter (HOSPITAL_COMMUNITY): Payer: Medicare Other | Attending: Hematology & Oncology

## 2015-05-03 ENCOUNTER — Other Ambulatory Visit: Payer: Self-pay

## 2015-05-03 ENCOUNTER — Telehealth: Payer: Self-pay | Admitting: *Deleted

## 2015-05-03 ENCOUNTER — Encounter (HOSPITAL_COMMUNITY): Payer: Self-pay

## 2015-05-03 DIAGNOSIS — D649 Anemia, unspecified: Secondary | ICD-10-CM | POA: Diagnosis not present

## 2015-05-03 DIAGNOSIS — C439 Malignant melanoma of skin, unspecified: Secondary | ICD-10-CM | POA: Insufficient documentation

## 2015-05-03 MED ORDER — EZETIMIBE 10 MG PO TABS: 10.0000 mg | ORAL_TABLET | Freq: Every day | ORAL | Status: DC

## 2015-05-03 MED ORDER — SODIUM CHLORIDE 0.9 % IV SOLN
INTRAVENOUS | Status: DC
  Administered 2015-05-03: 14:00:00 via INTRAVENOUS

## 2015-05-03 MED ORDER — SODIUM CHLORIDE 0.9 % IV SOLN
125.0000 mg | Freq: Once | INTRAVENOUS | Status: AC
  Administered 2015-05-03: 125 mg via INTRAVENOUS
  Filled 2015-05-03: qty 10

## 2015-05-03 NOTE — Telephone Encounter (Signed)
I spoke with the patient. 

## 2015-05-03 NOTE — Progress Notes (Signed)
Chad Brown Tolerated iron infusion well today Discharged ambulatory

## 2015-05-03 NOTE — Progress Notes (Signed)
EPIC Encounter for ICM Monitoring  Patient Name: Chad Brown is a 74 y.o. male Date: 05/03/2015 Primary Care Physican: Sherrie Mustache, MD Primary Cardiologist: Johnsie Cancel Electrophysiologist: Allred Dry Weight: 197 lbs  Bi-V pacing: 98.1%       In the past month, have you:  1. Gained more than 2 pounds in a day or more than 5 pounds in a week? no  2. Had changes in your medications (with verification of current medications)? no  3. Had more shortness of breath than is usual for you? no  4. Limited your activity because of shortness of breath? no  5. Not been able to sleep because of shortness of breath? no  6. Had increased swelling in your feet or ankles? No, but does report occasional abdominal fullness.  7. Had symptoms of dehydration (dizziness, dry mouth, increased thirst, decreased urine output) no  8. Had changes in sodium restriction? no  9. Been compliant with medication? Yes   ICM trend:   Follow-up plan: ICM clinic phone appointment: 07/18/15. This is my first ICM encounter with the patient. He is doing well today and without complaints. No recent a-fib noted on today's transmission. No changes made today. The patient is due to follow up with the CHF clinic 06/07/15 and Dr. Rayann Heman on 06/15/15.  Copy of note sent to patient's primary care physician, primary cardiologist, and device following physician.  Alvis Lemmings, RN, BSN 05/03/2015 10:54 AM

## 2015-05-03 NOTE — Patient Instructions (Signed)
[facility] at [facility] Discharge Instructions  RECOMMENDATIONS MADE BY THE CONSULTANT AND ANY TEST RESULTS WILL BE SENT TO YOUR REFERRING PHYSICIAN.  Iron infusion today Follow up as scheduled Please call the clinic if you have any questions or concerns  Thank you for choosing [facility] at [facility] to provide your oncology and hematology care.  To afford each patient quality time with our provider, please arrive at least 15 minutes before your scheduled appointment time.    You need to re-schedule your appointment should you arrive 10 or more minutes late.  We strive to give you quality time with our providers, and arriving late affects you and other patients whose appointments are after yours.  Also, if you no show three or more times for appointments you may be dismissed from the clinic at the providers discretion.     Again, thank you for choosing [facility].  Our hope is that these requests will decrease the amount of time that you wait before being seen by our physicians.       _____________________________________________________________  Should you have questions after your visit to  Cancer Center, please contact our office at [phone] between the hours of 8:30 a.m. and 4:30 p.m.  Voicemails left after 4:30 p.m. will not be returned until the following business day.  For prescription refill requests, have your pharmacy contact our office.    

## 2015-05-03 NOTE — Telephone Encounter (Signed)
ICM transmission received. I left a message for the patient to call at his home & cell #'s.

## 2015-05-06 ENCOUNTER — Encounter (HOSPITAL_BASED_OUTPATIENT_CLINIC_OR_DEPARTMENT_OTHER): Payer: Medicare Other

## 2015-05-06 ENCOUNTER — Other Ambulatory Visit: Payer: Self-pay | Admitting: *Deleted

## 2015-05-06 ENCOUNTER — Encounter (HOSPITAL_COMMUNITY): Payer: Self-pay

## 2015-05-06 VITALS — BP 163/78 | HR 59 | Temp 97.5°F | Resp 16

## 2015-05-06 DIAGNOSIS — D649 Anemia, unspecified: Secondary | ICD-10-CM

## 2015-05-06 DIAGNOSIS — I5022 Chronic systolic (congestive) heart failure: Secondary | ICD-10-CM

## 2015-05-06 DIAGNOSIS — I4819 Other persistent atrial fibrillation: Secondary | ICD-10-CM

## 2015-05-06 MED ORDER — LOSARTAN POTASSIUM 50 MG PO TABS: 50.0000 mg | ORAL_TABLET | Freq: Every day | ORAL | Status: DC

## 2015-05-06 MED ORDER — SODIUM CHLORIDE 0.9 % IV SOLN
INTRAVENOUS | Status: DC
  Administered 2015-05-06: 10:00:00 via INTRAVENOUS

## 2015-05-06 MED ORDER — SODIUM CHLORIDE 0.9 % IV SOLN
125.0000 mg | Freq: Once | INTRAVENOUS | Status: AC
  Administered 2015-05-06: 125 mg via INTRAVENOUS
  Filled 2015-05-06: qty 10

## 2015-05-06 NOTE — Patient Instructions (Signed)
[place] at [facility] Discharge Instructions  RECOMMENDATIONS MADE BY THE CONSULTANT AND ANY TEST RESULTS WILL BE SENT TO YOUR REFERRING PHYSICIAN.  IV iron infusion today Follow up as scheduled Please call the clinic if you have any questions or concerns  Thank you for choosing [place] at [facility] to provide your oncology and hematology care.  To afford each patient quality time with our provider, please arrive at least 15 minutes before your scheduled appointment time.    You need to re-schedule your appointment should you arrive 10 or more minutes late.  We strive to give you quality time with our providers, and arriving late affects you and other patients whose appointments are after yours.  Also, if you no show three or more times for appointments you may be dismissed from the clinic at the providers discretion.     Again, thank you for choosing [facility].  Our hope is that these requests will decrease the amount of time that you wait before being seen by our physicians.       _____________________________________________________________  Should you have questions after your visit to [facility], please contact our office at [phone] between the hours of 8:30 a.m. and 4:30 p.m.  Voicemails left after 4:30 p.m. will not be returned until the following business day.  For prescription refill requests, have your pharmacy contact our office.

## 2015-05-06 NOTE — Progress Notes (Signed)
Chad Brown Tolerated iron infusion well today Discharged ambulatory

## 2015-05-15 ENCOUNTER — Ambulatory Visit (HOSPITAL_BASED_OUTPATIENT_CLINIC_OR_DEPARTMENT_OTHER): Payer: Medicare Other | Attending: Internal Medicine

## 2015-05-15 VITALS — Ht 66.5 in | Wt 195.0 lb

## 2015-05-15 DIAGNOSIS — I480 Paroxysmal atrial fibrillation: Secondary | ICD-10-CM

## 2015-05-15 DIAGNOSIS — I493 Ventricular premature depolarization: Secondary | ICD-10-CM | POA: Insufficient documentation

## 2015-05-15 DIAGNOSIS — G4736 Sleep related hypoventilation in conditions classified elsewhere: Secondary | ICD-10-CM | POA: Insufficient documentation

## 2015-05-15 DIAGNOSIS — G4730 Sleep apnea, unspecified: Secondary | ICD-10-CM

## 2015-05-15 DIAGNOSIS — I4891 Unspecified atrial fibrillation: Secondary | ICD-10-CM | POA: Diagnosis not present

## 2015-05-15 DIAGNOSIS — R0683 Snoring: Secondary | ICD-10-CM | POA: Insufficient documentation

## 2015-05-15 DIAGNOSIS — G478 Other sleep disorders: Secondary | ICD-10-CM

## 2015-05-15 DIAGNOSIS — I5023 Acute on chronic systolic (congestive) heart failure: Secondary | ICD-10-CM

## 2015-05-15 DIAGNOSIS — I10 Essential (primary) hypertension: Secondary | ICD-10-CM | POA: Diagnosis present

## 2015-05-15 DIAGNOSIS — R001 Bradycardia, unspecified: Secondary | ICD-10-CM

## 2015-05-18 ENCOUNTER — Telehealth: Payer: Self-pay | Admitting: Cardiology

## 2015-05-18 DIAGNOSIS — G4733 Obstructive sleep apnea (adult) (pediatric): Secondary | ICD-10-CM

## 2015-05-18 NOTE — Telephone Encounter (Signed)
Please let patient know that he had insufficiency sleep time to assess for sleep apnea.  Please set up repeat study with Lunesta 2mg  (#1 tablets with no refills) to be taken at sleep lab.

## 2015-05-18 NOTE — Sleep Study (Signed)
SLEEP STUDY   Patient Name: Chad Brown, Chad Brown Date: 05/15/2015 MRN: [id number] Gender: Male D.O.B: Feb 12, 1941 Age (years): 36 Referring Provider: Thompson Grayer Interpreting Physician: Fransico Him MD, ABSM RPSGT: Madelon Lips  Weight (lbs): 195 Height (inches): 67 BMI: 31 Neck Size: 16.00   CLINICAL INFORMATION  Sleep Study Type: NPSG  Indication for sleep study: Hypertension, Obesity, Snoring  Epworth Sleepiness Score: 11  SLEEP STUDY TECHNIQUE As per the AASM Manual for the Scoring of Sleep and Associated Events v2.3 (April 2016) with a hypopnea requiring 4% desaturations.  The channels recorded and monitored were frontal, central and occipital EEG, electrooculogram (EOG), submentalis EMG (chin), nasal and oral airflow, thoracic and abdominal wall motion, anterior tibialis EMG, snore microphone, electrocardiogram, and pulse oximetry.  MEDICATIONS Patient's medications include: Amiodarone, Coreg, Cosopt, Lexapro, Proscar, Lasix, Neurontin, Hydralazine, Losartan, Kdur, Xarelto, Ultram, Flomax. Medications self-administered by patient during sleep study : No sleep medicine administered.  SLEEP ARCHITECTURE The study was initiated at 10:33:46 PM and ended at 4:49:35 AM.  Sleep onset time was 67.8 minutes and the sleep efficiency was reduced at 35.9%. The total sleep time was reduced at 135.0 minutes.  Stage REM latency was N/A minutes.  The patient spent 30.76% of the night in stage N1 sleep, 69.24% in stage N2 sleep, 0.00% in stage N3 and 0.00% in REM.  Alpha intrusion was absent.  Supine sleep was 0.37%.  RESPIRATORY PARAMETERS The overall apnea/hypopnea index (AHI) was 2.2 per hour. There were 2 total apneas, including 2 obstructive, 0 central and 0 mixed apneas. There were 3 hypopneas and 5 RERAs.  The AHI during Stage REM sleep was N/A per hour.  AHI while supine was 0.0 per hour.  The mean oxygen saturation was 90.26%. The minimum SpO2 during sleep  was 80.00%.  Loud snoring was noted during this study.  CARDIAC DATA The 2 lead EKG demonstrated sinus rhythm with IVCD. The mean heart rate was 60.45 beats per minute. Other EKG findings include: PVCs.  LEG MOVEMENT DATA The total PLMS were 146 with a resulting PLMS index of 64.87. Associated arousal with leg movement index was 4.9 .  IMPRESSIONS No significant obstructive sleep apnea occurred during this study (AHI = 2.2/h) but there was insufficiency sleep time to accurate assessment. No significant central sleep apnea occurred during this study (CAI = 0.0/h). Moderate oxygen desaturation was noted during this study (Min O2 = 80.00%). The patient snored with Loud snoring volume. EKG findings include PVCs. Severe periodic limb movements of sleep occurred during the study. No significant associated arousals.  DIAGNOSIS Nocturnal Hypoxemia (327.26 [G47.36 ICD-10])  RECOMMENDATIONS Recommend repeat sleep study with sleep aide as there was insufficiency sleep time to accurately assess for sleep apnea. Avoid alcohol, sedatives and other CNS depressants that may worsen sleep apnea and disrupt normal sleep architecture. Sleep hygiene should be reviewed to assess factors that may improve sleep quality. Weight management and regular exercise should be initiated or continued if appropriate.  Signed: Fransico Him, MD ABSM Diplomate, American Board of Sleep Medicine 05/17/2015

## 2015-05-24 ENCOUNTER — Encounter: Payer: Self-pay | Admitting: *Deleted

## 2015-05-24 NOTE — Telephone Encounter (Signed)
Left message to call back  

## 2015-05-24 NOTE — Telephone Encounter (Signed)
Patient is aware of results. Okay to proceed with sleep aid/repeat study.   Will schedule and send letter with date

## 2015-05-24 NOTE — Addendum Note (Signed)
Addended by: Andres Ege on: 05/24/2015 11:51 AM   Modules accepted: Orders

## 2015-06-07 ENCOUNTER — Ambulatory Visit (HOSPITAL_COMMUNITY)
Admission: RE | Admit: 2015-06-07 | Discharge: 2015-06-07 | Disposition: A | Discharge status: Home / Self Care | Payer: Medicare Other | Source: Ambulatory Visit | Attending: Cardiology | Admitting: Cardiology

## 2015-06-07 ENCOUNTER — Encounter (HOSPITAL_COMMUNITY): Payer: Self-pay

## 2015-06-07 VITALS — BP 106/78 | HR 68 | Wt 202.8 lb

## 2015-06-07 DIAGNOSIS — Z87891 Personal history of nicotine dependence: Secondary | ICD-10-CM | POA: Insufficient documentation

## 2015-06-07 DIAGNOSIS — Z96649 Presence of unspecified artificial hip joint: Secondary | ICD-10-CM | POA: Insufficient documentation

## 2015-06-07 DIAGNOSIS — I2510 Atherosclerotic heart disease of native coronary artery without angina pectoris: Secondary | ICD-10-CM | POA: Diagnosis not present

## 2015-06-07 DIAGNOSIS — I5022 Chronic systolic (congestive) heart failure: Secondary | ICD-10-CM | POA: Insufficient documentation

## 2015-06-07 DIAGNOSIS — E785 Hyperlipidemia, unspecified: Secondary | ICD-10-CM | POA: Diagnosis not present

## 2015-06-07 DIAGNOSIS — Z79899 Other long term (current) drug therapy: Secondary | ICD-10-CM | POA: Diagnosis not present

## 2015-06-07 DIAGNOSIS — N189 Chronic kidney disease, unspecified: Secondary | ICD-10-CM | POA: Insufficient documentation

## 2015-06-07 DIAGNOSIS — Z8249 Family history of ischemic heart disease and other diseases of the circulatory system: Secondary | ICD-10-CM | POA: Diagnosis not present

## 2015-06-07 DIAGNOSIS — I4819 Other persistent atrial fibrillation: Secondary | ICD-10-CM

## 2015-06-07 DIAGNOSIS — Z85820 Personal history of malignant melanoma of skin: Secondary | ICD-10-CM | POA: Insufficient documentation

## 2015-06-07 DIAGNOSIS — I428 Other cardiomyopathies: Secondary | ICD-10-CM | POA: Diagnosis not present

## 2015-06-07 DIAGNOSIS — I480 Paroxysmal atrial fibrillation: Secondary | ICD-10-CM | POA: Diagnosis not present

## 2015-06-07 DIAGNOSIS — I481 Persistent atrial fibrillation: Secondary | ICD-10-CM

## 2015-06-07 DIAGNOSIS — Z7902 Long term (current) use of antithrombotics/antiplatelets: Secondary | ICD-10-CM | POA: Diagnosis not present

## 2015-06-07 LAB — BASIC METABOLIC PANEL
Anion gap: 5 (ref 5–15)
BUN: 14 mg/dL (ref 6–20)
CALCIUM: 8.6 mg/dL — AB (ref 8.9–10.3)
CO2: 33 mmol/L — ABNORMAL HIGH (ref 22–32)
CREATININE: 1.03 mg/dL (ref 0.61–1.24)
Chloride: 106 mmol/L (ref 101–111)
GFR calc Af Amer: >60 mL/min (ref >60)
Glucose, Bld: 106 mg/dL — ABNORMAL HIGH (ref 65–99)
Potassium: 4.3 mmol/L (ref 3.5–5.1)
SODIUM: 144 mmol/L (ref 135–145)

## 2015-06-07 LAB — TSH: TSH: 1.124 u[IU]/mL (ref 0.350–4.500)

## 2015-06-07 LAB — BRAIN NATRIURETIC PEPTIDE: B Natriuretic Peptide: 418.8 pg/mL — ABNORMAL HIGH (ref 0.0–100.0)

## 2015-06-07 MED ORDER — SPIRONOLACTONE 25 MG PO TABS: 12.5000 mg | ORAL_TABLET | Freq: Every day | ORAL | Status: DC

## 2015-06-07 MED ORDER — LOSARTAN POTASSIUM 50 MG PO TABS: ORAL_TABLET | ORAL | Status: DC

## 2015-06-07 MED ORDER — FUROSEMIDE 20 MG PO TABS: 40.0000 mg | ORAL_TABLET | Freq: Every day | ORAL | Status: DC

## 2015-06-07 NOTE — Progress Notes (Signed)
Patient ID: Chad Brown, male   DOB: 1941-05-15, 74 y.o.   MRN: [id number] PCP: [facility] Cardiologist: Johnsie Cancel HF Cardiologist: Aundra Dubin  74 yo with paroxysmal atrial fibrillation and chronic systolic CHF thought to be due to nonischemic cardiomyopathy presents for CHF clinic evaluation. He has a cardiac history dating back to 1998, when he was admitted with chest pain and had cardiac cath showing nonobstructive CAD. In 4/14, he was found to be in atrial fibrillation.  Echo showed EF 30-35%.  He had DCCV back to NSR.  He was back in atrial fibrillation in 1/16, and EF was low again on TEE at that time.   Again, he had DCCV.  In 5/16, cardiac MRI showed persistently low EF, so given LBBB, he had Medtronic CRT-D device placed.  He was back in atrial fibrillation in 6/16 and had TEE-guided DCCV again with EF now 15-20% on TEE.    Today, he remains in NSR.  He is on a higher dose of amiodarone, 300 mg daily.   He is mainly limited by knee pain at this time.  He was able to golf last week, went 16 holes then got tired.  He is going to the [facility] 3 times/[facility].  No orthopnea/PND.  Dyspnea only with heavy exertion though not very active.  Able to mow grass (riding lawnmower).  No chest pain.  Trying to avoid sodium.  Taking Xarelto, no melena or BRBPR.   Labs (6/16): LDL 153 Labs (7/16): HCT 40, LFTs normal  Optivol: fluid index > threshold, impedance trending down  PMH: 1. Atrial fibrillation: Paroxysmal.  Diagnosed 4/14, DCCV 4/14 to NSR.  DCCV 1/16 to NSR.  DCCV 6/16 to NSR.  2. Chronic systolic CHF: Nonischemic cardiomyopathy.  LHC in 1998 with nonobstructive disease.  Cardiolite in 6/14 with no ischemia or infarction.  cMRI (5/16) with EF 34%, mildly dilated LV with diffuse HK worse in anterolateral wall, small punctate areas of LGE in anteroseptum and basal inferior wall (not CAD pattern).  TEE (6/16) with EF 15-20%.  He has Medtronic CRT-D device.  3. Hyperlipidemia: Myalgias with atorvastatin,  Livalo, and pravastatin.  4. CKD 5. OA: s/p THR.  6. H/o melanoma 7. Anemia 8. Diverticulosis  SH: Married with 3 children, lives in [place], retired, quit smoking in 2001.    FH: Mother with MI  ROS: All systems reviewed and negative except as per HPI.  Current Outpatient Prescriptions  Medication Sig Dispense Refill  . acetaminophen (TYLENOL) 500 MG tablet Take 1,000 mg by mouth every 4 (four) hours as needed for moderate pain or headache.    [name] amiodarone (PACERONE) 200 MG tablet Take one and one-half tablet by mouth daily 45 tablet 11  . carvedilol (COREG) 12.5 MG tablet Take 12.5 mg by mouth 2 (two) times daily with a meal.    . dorzolamide-timolol (COSOPT) 22.3-6.8 MG/ML ophthalmic solution Place 1 drop into the right eye 2 (two) times daily.    [name] escitalopram (LEXAPRO) 20 MG tablet Take 1/2 tablet by mouth daily for 7 days then increase to one tablet thereafter 30 tablet 4  . ezetimibe (ZETIA) 10 MG tablet Take 1 tablet (10 mg total) by mouth daily. 90 tablet 3  . finasteride (PROSCAR) 5 MG tablet Take 5 mg by mouth every morning.     . furosemide (LASIX) 20 MG tablet Take 2 tablets (40 mg total) by mouth daily. 90 tablet 2  . gabapentin (NEURONTIN) 100 MG capsule Take 200-300 mg by mouth See  admin instructions. Take 2 capsules by mouth in the morning and 3 capsules by mouth at night    . lipase/protease/amylase (CREON) 36000 UNITS CPEP capsule Take 1 capsule (36,000 Units total) by mouth as needed. And with snacks 150 capsule 1  . losartan (COZAAR) 50 MG tablet Take 1 tab in AM and 1/2 tab in PM 45 tablet 5  . NIASPAN 500 MG CR tablet Take 1,000 mg by mouth at bedtime.     [name] Glycol-Propyl Glycol (SYSTANE ULTRA OP) Place 1 drop into the left eye 2 (two) times daily.     . potassium chloride SA (K-DUR,KLOR-CON) 20 MEQ tablet Take 1 tablet (20 mEq total) by mouth every morning. Every time you take 2 furosemide/Lasix tablets, take 2 potassium tablets 45 tablet 11  .  rivaroxaban (XARELTO) 20 MG TABS tablet Take 1 tablet (20 mg total) by mouth daily with supper. Restart tomorrow 02/11/2015 90 tablet 2  . tamsulosin (FLOMAX) 0.4 MG CAPS Take 0.4 mg by mouth at bedtime.     . traMADol (ULTRAM) 50 MG tablet Take 1 tablet (50 mg total) by mouth every 8 (eight) hours. (Patient taking differently: Take 50 mg by mouth as needed (pain). ) 90 tablet 1  . spironolactone (ALDACTONE) 25 MG tablet Take 0.5 tablets (12.5 mg total) by mouth daily. 15 tablet 3   No current facility-administered medications for this encounter.   BP 106/78 mmHg  Pulse 68  Wt 202 lb 12.8 oz (91.989 kg)  SpO2 98% General: NAD Neck: JVP 8 cm with HJR, no thyromegaly or thyroid nodule.  Lungs: Clear to auscultation bilaterally with normal respiratory effort. CV: Nondisplaced PMI.  Heart regular S1/S2, no S3/S4, no murmur.  1+ ankle edema.  No carotid bruit.  Normal pedal pulses.  Abdomen: Soft, nontender, no hepatosplenomegaly, no distention.  Skin: Intact without lesions or rashes.  Neurologic: Alert and oriented x 3.  Psych: Normal affect. Extremities: No clubbing or cyanosis.  HEENT: Normal.   Assessment/Plan: 1. Chronic systolic CHF: Suspect nonischemic cardiomyopathy.  EF 15-20% on 6/16 TEE.  Cardiac MRI in 5/16 showed a LGE pattern that was not suggestive of coronary disease (?myocarditis or infiltrative disease).  There may be a component of tachycardia-mediated cardiomyopathy, as all his echoes appear to have been done in the setting of atrial fibrillation.  However, the cardiac MRI appears to have been done when he was in NSR.  He has Medtronic CRT-D device.  NYHA class II symptoms but volume overloaded by exam and by Optivol assessment.  - Increase Lasix to 40 mg every day.  BMET/BNP today and repeat in 2 wks.  - Stop hydralazine.  - Increase losartan to 50 qam, 25 qpm.   If he tolerates higher losartan dose, would consider eventual switch to Entresto.   - Add spironolactone 12.5 mg  daily (BMET 2 wks).  - Continue current Coreg.  - I will arrange for a repeat echo now that he is back in NSR and is post-CRT placement.  - Followup in 1 month.  2. Atrial fibrillation: Seems to tolerate poorly.  He is in NSR today on a higher dose of amiodarone since last DCCV in 6/16.   - Continue Xarelto 20 daily.  - Continue amiodarone at current dose.  Recent LFTs normal, needs TSH done today.  He should have regular eye exam.  - He will be seeing Dr Rayann Heman soon.  Given poor toleration of atrial fibrillation and need for higher dose of amiodarone, would he  be an ablation candidate?  3. Hyperlipidemia: LDL remains quite high on Zetia.  He cannot take statins.  Will refer to lipid clinic for PCSK9-inhibitor.  However, he may not qualify in absence of recent vascular event.    Loralie Champagne 06/07/2015 4:45 PM

## 2015-06-07 NOTE — Patient Instructions (Signed)
Change Furosemide (Lasix) to 40 mg daily  Stop Hydralazine  Increase Losartan to 50 mg (1 tab) in AM and 25 mg (1/2 tab) in PM  Start Spironolactone 12.5 mg (1/2 tab) daily  Labs today  Labs in 2 weeks  Your physician has requested that you have an echocardiogram. Echocardiography is a painless test that uses sound waves to create images of your heart. It provides your doctor with information about the size and shape of your heart and how well your heart's chambers and valves are working. This procedure takes approximately one hour. There are no restrictions for this procedure.  Your physician recommends that you schedule a follow-up appointment in: 1 month

## 2015-06-15 ENCOUNTER — Encounter: Payer: Self-pay | Admitting: Internal Medicine

## 2015-06-15 ENCOUNTER — Ambulatory Visit (INDEPENDENT_AMBULATORY_CARE_PROVIDER_SITE_OTHER): Payer: Medicare Other | Admitting: Internal Medicine

## 2015-06-15 ENCOUNTER — Other Ambulatory Visit: Payer: Self-pay | Admitting: Cardiovascular Disease

## 2015-06-15 ENCOUNTER — Telehealth: Payer: Self-pay

## 2015-06-15 VITALS — BP 162/92 | HR 68 | Ht 66.0 in | Wt 202.4 lb

## 2015-06-15 DIAGNOSIS — I10 Essential (primary) hypertension: Secondary | ICD-10-CM | POA: Diagnosis not present

## 2015-06-15 DIAGNOSIS — I5022 Chronic systolic (congestive) heart failure: Secondary | ICD-10-CM

## 2015-06-15 DIAGNOSIS — I429 Cardiomyopathy, unspecified: Secondary | ICD-10-CM

## 2015-06-15 DIAGNOSIS — I481 Persistent atrial fibrillation: Secondary | ICD-10-CM

## 2015-06-15 DIAGNOSIS — I428 Other cardiomyopathies: Secondary | ICD-10-CM

## 2015-06-15 DIAGNOSIS — I4819 Other persistent atrial fibrillation: Secondary | ICD-10-CM

## 2015-06-15 LAB — CUP PACEART INCLINIC DEVICE CHECK
Brady Statistic AP VP Percent: 92.6 %
Brady Statistic AP VS Percent: 1.47 %
Brady Statistic AS VP Percent: 5.8 %
Brady Statistic RA Percent Paced: 94.07 %
Brady Statistic RV Percent Paced: 2.19 %
HighPow Impedance: 133 Ohm
HighPow Impedance: 61 Ohm
Lead Channel Pacing Threshold Amplitude: 0.75 V
Lead Channel Pacing Threshold Amplitude: 1 V
Lead Channel Pacing Threshold Pulse Width: 0.4 ms
Lead Channel Sensing Intrinsic Amplitude: 15.875 mV
Lead Channel Setting Pacing Amplitude: 1.5 V
Lead Channel Setting Pacing Amplitude: 2 V
Lead Channel Setting Pacing Amplitude: 2.25 V
Lead Channel Setting Pacing Pulse Width: 0.4 ms
MDC IDC MSMT BATTERY REMAINING LONGEVITY: 106 mo
MDC IDC MSMT BATTERY VOLTAGE: 3.06 V
MDC IDC MSMT LEADCHNL LV PACING THRESHOLD AMPLITUDE: 0.75 V
MDC IDC MSMT LEADCHNL LV PACING THRESHOLD PULSEWIDTH: 0.4 ms
MDC IDC MSMT LEADCHNL RA IMPEDANCE VALUE: 380 Ohm
MDC IDC MSMT LEADCHNL RA PACING THRESHOLD PULSEWIDTH: 0.4 ms
MDC IDC MSMT LEADCHNL RA SENSING INTR AMPL: 2.375 mV
MDC IDC MSMT LEADCHNL RV IMPEDANCE VALUE: 456 Ohm
MDC IDC SESS DTM: 20160914084732
MDC IDC SET LEADCHNL LV PACING PULSEWIDTH: 0.4 ms
MDC IDC SET LEADCHNL RV SENSING SENSITIVITY: 0.3 mV
MDC IDC SET ZONE DETECTION INTERVAL: 300 ms
MDC IDC SET ZONE DETECTION INTERVAL: 360 ms
MDC IDC STAT BRADY AS VS PERCENT: 0.13 %
Zone Setting Detection Interval: 330 ms
Zone Setting Detection Interval: 350 ms

## 2015-06-15 MED ORDER — LOSARTAN POTASSIUM 100 MG PO TABS: 100.0000 mg | ORAL_TABLET | Freq: Every day | ORAL | Status: DC

## 2015-06-15 NOTE — Progress Notes (Signed)
Electrophysiology Office Note   Date:  06/15/2015   ID:  Chad Brown, DOB 12/15/1940, MRN [id number]  PCP:  Sherrie Mustache, MD  Cardiologist:  Dr Johnsie Cancel Also followed in CHF clinic by Dr Aundra Dubin Primary Electrophysiologist: Thompson Grayer, MD    Chief Complaint  Patient presents with  . PAF  . Chronic systolic CHF     History of Present Illness: Chad Brown is a 74 y.o. male who presents today for electrophysiology evaluation.   Doing very well BiV ICD implant.  Feels "much better".  Tolerating xarelto and is now in sinus.  Today, he denies symptoms of palpitations, chest pain, shortness of breath, orthopnea, PND, lower extremity edema, claudication, dizziness, presyncope, syncope, bleeding, or neurologic sequela. The patient is tolerating medications without difficulties and is otherwise without complaint today.    Past Medical History  Diagnosis Date  . Hyperlipidemia   . Hypertension   . Chronic systolic CHF (congestive heart failure)     a. New dx 12/2012 ? NICM, may be r/t afib. b. Nuc 03/2013 - normal;  c. 03/2015 TEE EF 15-20%.  [name] PAF (paroxysmal atrial fibrillation)     a. Dx 12/2012, s/p TEE/DCCV 01/26/13. b. On Xarelto (CHA2DS2VASc = 3);  c. 03/2015 TEE (EF 15-20%, no LAA thrombus) and DCCV - amio increased to 200 mg bid.  . Anemia     iron deficient  . Cholelithiasis   . Atherosclerosis   . Colon polyp, hyperplastic 5/16    removed precancerous lesions  . AICD (automatic cardioverter/defibrillator) present 03/08/2015    MDT CRTD   . Chronic systolic CHF (congestive heart failure)   . Myocardial infarction 1998  . Arthritis     "about all my joints; hands, knees, back" (03/08/2015)  . Urinary hesitancy due to benign prostatic hypertrophy   . Melanoma of eye 2000's    "right; it's never been biopsied"  . Melanoma of lower leg 2015    "left; right at my knee"  . Adenomatous colon polyp     tubular  . Diverticulosis    Past Surgical History    Procedure Laterality Date  . Back surgery    . Total hip arthroplasty Right 06/2007  . Cataract extraction Right ~ 2006  . Glaucoma surgery Right ~ 2006    "put 3 stents in to drain fluid" (03/08/2015)  . Refractive surgery Right ~ 2006 X 2    "twice; both done at [facility]" (03/08/2015  . Surgery scrotal / testicular Right 1990's  . Incision and drainage abscess posterior cervicalspine  05/2012  . Cardiac catheterization  1998  . Tee without cardioversion N/A 01/26/2013    Procedure: TRANSESOPHAGEAL ECHOCARDIOGRAM (TEE);  Surgeon: Lelon Perla, MD;  Location: [facility];  Service: Cardiovascular;  Laterality: N/A;  [name] anes. /   . Cardioversion N/A 01/26/2013    Procedure: CARDIOVERSION;  Surgeon: Lelon Perla, MD;  Location[facility] ENDOSCOPY;  Service: Cardiovascular;  Laterality: N/A;  . Tee without cardioversion N/A 10/05/2014    Procedure: TRANSESOPHAGEAL ECHOCARDIOGRAM (TEE)  with cardioversion;  Surgeon: Thayer Headings, MD;  Location: [facility] ENDOSCOPY;  Service: Cardiovascular;  Laterality: N/A;  12:52 synched cardioversion at 120 joules,...afib to SR...12 lead EKG ordered.Marland KitchenMarland KitchenCardiozem d/c'ed per MD verbal order at [place]  . Melanoma excision Left 2015    "lower leg; right at my knee"  . Colonoscopy with propofol N/A 02/10/2015    Procedure: COLONOSCOPY WITH PROPOFOL;  Surgeon: Gatha Mayer, MD;  Location: [facility] ENDOSCOPY;  Service:  Endoscopy;  Laterality: N/A;  . Joint replacement    . Thoracic spine surgery  03/2000    "ground calcium deposits from upper thoracic" (01/26/2013)  . Ep implantable device N/A 03/08/2015    MDT Hillery Aldo CRT-D for nonischemic CM by Dr Rayann Heman for primary prevention  . Cardioversion N/A 03/23/2015    Procedure: CARDIOVERSION;  Surgeon: Jerline Pain, MD;  Location: [facility];  Service: Cardiovascular;  Laterality: N/A;  . Tee without cardioversion N/A 03/23/2015    Procedure: TRANSESOPHAGEAL ECHOCARDIOGRAM (TEE);  Surgeon: Jerline Pain, MD;  Location: [facility] ENDOSCOPY;   Service: Cardiovascular;  Laterality: N/A;     Current Outpatient Prescriptions  Medication Sig Dispense Refill  . acetaminophen (TYLENOL) 500 MG tablet Take 1,000 mg by mouth every 4 (four) hours as needed for moderate pain or headache.    [name] amiodarone (PACERONE) 200 MG tablet Take one and one-half tablet by mouth daily 45 tablet 11  . carvedilol (COREG) 12.5 MG tablet Take 12.5 mg by mouth 2 (two) times daily with a meal.    . dorzolamide-timolol (COSOPT) 22.3-6.8 MG/ML ophthalmic solution Place 1 drop into the right eye 2 (two) times daily.    [name] escitalopram (LEXAPRO) 20 MG tablet Take 20 mg by mouth daily.    [name] ezetimibe (ZETIA) 10 MG tablet Take 1 tablet (10 mg total) by mouth daily. 90 tablet 3  . finasteride (PROSCAR) 5 MG tablet Take 5 mg by mouth every morning.     . furosemide (LASIX) 20 MG tablet Take 2 tablets (40 mg total) by mouth daily. 90 tablet 2  . gabapentin (NEURONTIN) 100 MG capsule Take 200-300 mg by mouth See admin instructions. Take 2 capsules by mouth in the morning and 3 capsules by mouth at night    . lipase/protease/amylase (CREON) 36000 UNITS CPEP capsule Take 1 capsule (36,000 Units total) by mouth as needed. And with snacks 150 capsule 1  . losartan (COZAAR) 50 MG tablet Take 1 tab in AM and 1/2 tab in PM 45 tablet 5  . NIASPAN 500 MG CR tablet Take 1,000 mg by mouth at bedtime.     [name] Glycol-Propyl Glycol (SYSTANE ULTRA OP) Place 1 drop into the left eye 2 (two) times daily.     . potassium chloride SA (K-DUR,KLOR-CON) 20 MEQ tablet Take 20 mEq by mouth 2 (two) times daily.    . rivaroxaban (XARELTO) 20 MG TABS tablet Take 1 tablet (20 mg total) by mouth daily with supper. Restart tomorrow 02/11/2015 90 tablet 2  . spironolactone (ALDACTONE) 25 MG tablet Take 0.5 tablets (12.5 mg total) by mouth daily. 15 tablet 3  . tamsulosin (FLOMAX) 0.4 MG CAPS Take 0.4 mg by mouth at bedtime.      No current facility-administered medications for this visit.     Allergies:   Pravastatin sodium and Zithromax   Social History:  The patient  reports that he quit smoking about 14 years ago. His smoking use included Cigarettes. He has a 144 pack-year smoking history. He has never used smokeless tobacco. He reports that he does not drink alcohol or use illicit drugs.   Family History:  The patient's family history includes Colon cancer in his paternal uncle; Diabetes in his maternal aunt, maternal grandmother, maternal uncle, and mother; Heart attack in his maternal uncle; Heart disease in his mother; Kidney disease in his mother; Leukemia in his father; Lung cancer in his paternal uncle; Prostate cancer in his paternal uncle.    ROS:  Please see the history of present illness.   All other systems are reviewed and negative.    PHYSICAL EXAM: VS:  BP 162/92 mmHg  Pulse 68  Ht 5\' 6"  (1.676 m)  Wt 91.808 kg (202 lb 6.4 oz)  BMI 32.68 kg/m2 , BMI Body mass index is 32.68 kg/(m^2). GEN: Well nourished, well developed, in no acute distress HEENT: normal Neck: no JVD, carotid bruits, or masses Cardiac: RRR; no murmurs, rubs, or gallops,no edema  Respiratory:  clear to auscultation bilaterally, normal work of breathing GI: soft, nontender, nondistended, + BS MS: no deformity or atrophy Skin: warm and dry, device pocket is well healed Neuro:  Strength and sensation are intact Psych: euthymic mood, full affect  EKG:  EKG is ordered today. The ekg ordered today shows AV paced  Device interrogation is reviewed today in detail.  See PaceArt for details.   Recent Labs: 10/08/2014: Magnesium 2.3 10/29/2014: Pro B Natriuretic peptide (BNP) 385.0* 04/19/2015: ALT 41; Hemoglobin 13.0; Platelets 123* 06/07/2015: B Natriuretic Peptide 418.8*; BUN 14; Creatinine, Ser 1.03; Potassium 4.3; Sodium 144; TSH 1.124    Lipid Panel     Component Value Date/Time   CHOL 200 03/22/2015 0536   TRIG 105 03/22/2015 0536   HDL 26* 03/22/2015 0536   CHOLHDL 7.7  03/22/2015 0536   VLDL 21 03/22/2015 0536   LDLCALC 153* 03/22/2015 0536     Wt Readings from Last 3 Encounters:  06/15/15 91.808 kg (202 lb 6.4 oz)  06/07/15 91.989 kg (202 lb 12.8 oz)  05/15/15 88.451 kg (195 lb)      Other studies Reviewed: Additional studies/ records that were reviewed today include: EP NP and Dr Mariana Arn notes, Labs   ASSESSMENT AND PLAN:  1.  Nonischemic CM Normal BiV ICD function See Pace Art report No changes today Repeat echo scheduled for 9/19  2. Persistent atrial fibrillation Recently has returned to sinus Continue to xarelto Consider reducing amiodarone to 200mg  daily when he sees Dr Aundra Dubin in October  3. HTN Above goal Increase losartan to 100 mg daily  Follow-up with Drs Johnsie Cancel and Aundra Dubin as scheduled [facility] Return to see EP NP in 6 months  Current medicines are reviewed at length with the patient today.   The patient does not have concerns regarding his medicines.  The following changes were made today:  none   Signed, Thompson Grayer, MD  06/15/2015 8:39 AM     [facility] [address] [place] [address] [phone] (office) [phone] (fax)

## 2015-06-15 NOTE — Patient Instructions (Signed)
Medication Instructions:  Your physician has recommended you make the following change in your medication:  1) Increase Losartan to 100 mg daily   Labwork: None ordered  Testing/Procedures: None ordered  Follow-Up: Your physician wants you to follow-up in: 6 months with Chanetta Marshall, NP You will receive a reminder letter in the mail two months in advance. If you don't receive a letter, please call our office to schedule the follow-up appointment.  Remote monitoring is used to monitor your  ICD from home. This monitoring reduces the number of office visits required to check your device to one time per year. It allows [place] to keep an eye on the functioning of your device to ensure it is working properly. You are scheduled for a device check from home on 09/14/15. You may send your transmission at any time that day. If you have a wireless device, the transmission will be sent automatically. After your physician reviews your transmission, you will receive a postcard with your next transmission date.     Any Other Special Instructions Will Be Listed Below (If Applicable).

## 2015-06-15 NOTE — Telephone Encounter (Signed)
[facility] would like for you to give them a call regarding patients Cozaar. They are not wanting to cover the new doses. He would like to know if they can split the dose to 100 mg or 50 mg? Please call and advise at [phone].

## 2015-06-16 ENCOUNTER — Other Ambulatory Visit: Payer: Self-pay | Admitting: *Deleted

## 2015-06-16 MED ORDER — FUROSEMIDE 20 MG PO TABS: 40.0000 mg | ORAL_TABLET | Freq: Every day | ORAL | Status: DC

## 2015-06-20 ENCOUNTER — Other Ambulatory Visit: Payer: Self-pay

## 2015-06-20 ENCOUNTER — Other Ambulatory Visit: Payer: Self-pay | Admitting: *Deleted

## 2015-06-20 ENCOUNTER — Ambulatory Visit (HOSPITAL_COMMUNITY): Payer: Medicare Other | Attending: Cardiology

## 2015-06-20 DIAGNOSIS — I340 Nonrheumatic mitral (valve) insufficiency: Secondary | ICD-10-CM | POA: Insufficient documentation

## 2015-06-20 DIAGNOSIS — I509 Heart failure, unspecified: Secondary | ICD-10-CM | POA: Diagnosis present

## 2015-06-20 DIAGNOSIS — I5022 Chronic systolic (congestive) heart failure: Secondary | ICD-10-CM | POA: Diagnosis not present

## 2015-06-20 DIAGNOSIS — E669 Obesity, unspecified: Secondary | ICD-10-CM | POA: Insufficient documentation

## 2015-06-20 DIAGNOSIS — I351 Nonrheumatic aortic (valve) insufficiency: Secondary | ICD-10-CM | POA: Diagnosis not present

## 2015-06-20 DIAGNOSIS — Z6833 Body mass index (BMI) 33.0-33.9, adult: Secondary | ICD-10-CM | POA: Insufficient documentation

## 2015-06-20 DIAGNOSIS — I071 Rheumatic tricuspid insufficiency: Secondary | ICD-10-CM | POA: Diagnosis not present

## 2015-06-20 DIAGNOSIS — E785 Hyperlipidemia, unspecified: Secondary | ICD-10-CM | POA: Diagnosis not present

## 2015-06-20 DIAGNOSIS — I517 Cardiomegaly: Secondary | ICD-10-CM | POA: Insufficient documentation

## 2015-06-20 DIAGNOSIS — I10 Essential (primary) hypertension: Secondary | ICD-10-CM | POA: Insufficient documentation

## 2015-06-20 MED ORDER — FUROSEMIDE 20 MG PO TABS: 40.0000 mg | ORAL_TABLET | Freq: Every day | ORAL | Status: DC

## 2015-06-28 ENCOUNTER — Encounter: Payer: Self-pay | Admitting: Cardiology

## 2015-07-04 ENCOUNTER — Other Ambulatory Visit: Payer: Self-pay

## 2015-07-04 MED ORDER — PANCRELIPASE (LIP-PROT-AMYL) 36000-114000 UNITS PO CPEP: 36000.0000 [IU] | ORAL_CAPSULE | ORAL | Status: DC | PRN

## 2015-07-07 ENCOUNTER — Ambulatory Visit (HOSPITAL_COMMUNITY)
Admission: RE | Admit: 2015-07-07 | Discharge: 2015-07-07 | Disposition: A | Discharge status: Home / Self Care | Payer: Medicare Other | Source: Ambulatory Visit | Attending: Cardiology | Admitting: Cardiology

## 2015-07-07 VITALS — BP 122/74 | HR 63 | Wt 202.0 lb

## 2015-07-07 DIAGNOSIS — Z95810 Presence of automatic (implantable) cardiac defibrillator: Secondary | ICD-10-CM | POA: Insufficient documentation

## 2015-07-07 DIAGNOSIS — I481 Persistent atrial fibrillation: Secondary | ICD-10-CM

## 2015-07-07 DIAGNOSIS — Z85820 Personal history of malignant melanoma of skin: Secondary | ICD-10-CM | POA: Insufficient documentation

## 2015-07-07 DIAGNOSIS — I428 Other cardiomyopathies: Secondary | ICD-10-CM | POA: Diagnosis not present

## 2015-07-07 DIAGNOSIS — E785 Hyperlipidemia, unspecified: Secondary | ICD-10-CM | POA: Insufficient documentation

## 2015-07-07 DIAGNOSIS — Z87891 Personal history of nicotine dependence: Secondary | ICD-10-CM | POA: Diagnosis not present

## 2015-07-07 DIAGNOSIS — D649 Anemia, unspecified: Secondary | ICD-10-CM

## 2015-07-07 DIAGNOSIS — Z7902 Long term (current) use of antithrombotics/antiplatelets: Secondary | ICD-10-CM | POA: Insufficient documentation

## 2015-07-07 DIAGNOSIS — I480 Paroxysmal atrial fibrillation: Secondary | ICD-10-CM | POA: Insufficient documentation

## 2015-07-07 DIAGNOSIS — I4819 Other persistent atrial fibrillation: Secondary | ICD-10-CM

## 2015-07-07 DIAGNOSIS — Z79899 Other long term (current) drug therapy: Secondary | ICD-10-CM | POA: Diagnosis not present

## 2015-07-07 DIAGNOSIS — C439 Malignant melanoma of skin, unspecified: Secondary | ICD-10-CM | POA: Diagnosis not present

## 2015-07-07 DIAGNOSIS — N189 Chronic kidney disease, unspecified: Secondary | ICD-10-CM | POA: Insufficient documentation

## 2015-07-07 DIAGNOSIS — I5022 Chronic systolic (congestive) heart failure: Secondary | ICD-10-CM | POA: Diagnosis not present

## 2015-07-07 LAB — COMPREHENSIVE METABOLIC PANEL
ALBUMIN: 3.3 g/dL — AB (ref 3.5–5.0)
ALT: 47 U/L (ref 17–63)
AST: 33 U/L (ref 15–41)
Alkaline Phosphatase: 77 U/L (ref 38–126)
Anion gap: 6 (ref 5–15)
BUN: 11 mg/dL (ref 6–20)
CHLORIDE: 103 mmol/L (ref 101–111)
CO2: 31 mmol/L (ref 22–32)
Calcium: 8.7 mg/dL — ABNORMAL LOW (ref 8.9–10.3)
Creatinine, Ser: 1.05 mg/dL (ref 0.61–1.24)
GFR calc Af Amer: >60 mL/min (ref >60)
GFR calc non Af Amer: >60 mL/min (ref >60)
GLUCOSE: 110 mg/dL — AB (ref 65–99)
POTASSIUM: 4.4 mmol/L (ref 3.5–5.1)
Sodium: 140 mmol/L (ref 135–145)
Total Bilirubin: 0.7 mg/dL (ref 0.3–1.2)
Total Protein: 6.2 g/dL — ABNORMAL LOW (ref 6.5–8.1)

## 2015-07-07 LAB — BRAIN NATRIURETIC PEPTIDE: B Natriuretic Peptide: 329.2 pg/mL — ABNORMAL HIGH (ref 0.0–100.0)

## 2015-07-07 MED ORDER — SACUBITRIL-VALSARTAN 24-26 MG PO TABS: 1.0000 | ORAL_TABLET | Freq: Two times a day (BID) | ORAL | Status: DC

## 2015-07-07 MED ORDER — AMIODARONE HCL 200 MG PO TABS: 200.0000 mg | ORAL_TABLET | Freq: Every day | ORAL | Status: DC

## 2015-07-07 NOTE — Patient Instructions (Signed)
Stop Losartan  Start taking Entresto as prescribed  Decrease Amiodarone to once a day  Return in 2 weeks for lab work  Return in 2 months for for follow up appointment

## 2015-07-09 NOTE — Progress Notes (Signed)
Patient ID: Chad Brown, male   DOB: 09-04-1941, 74 y.o.   MRN: [id number] PCP: [facility] Cardiologist: Johnsie Cancel HF Cardiologist: Aundra Dubin  74 yo with paroxysmal atrial fibrillation and chronic systolic CHF thought to be due to nonischemic cardiomyopathy presents for CHF clinic evaluation. He has a cardiac history dating back to 1998, when he was admitted with chest pain and had cardiac cath showing nonobstructive CAD. In 4/14, he was found to be in atrial fibrillation.  Echo showed EF 30-35%.  He had DCCV back to NSR.  He was back in atrial fibrillation in 1/16, and EF was low again on TEE at that time.   Again, he had DCCV.  In 5/16, cardiac MRI showed persistently low EF, so given LBBB, he had Medtronic CRT-D device placed.  He was back in atrial fibrillation in 6/16 and had TEE-guided DCCV again with EF now 15-20% on TEE.    Today, he remains in NSR.  He is mainly limited by knee pain at this time.  He was able to golf last week, went 16 holes then got tired.  He says that he feels good, the best in a long time.  No orthopnea/PND.  Dyspnea only with heavy exertion.  No chest pain.  Trying to avoid sodium.  Taking Xarelto, no melena or BRBPR.  Weight stable.  Echo in 9/16 with EF up to 40%.   Labs (6/16): LDL 153 Labs (7/16): HCT 40, LFTs normal, LFTs normal Labs (9/16): TSH normal, K 4.3, creatinine 1.03, BNP 419  Optivol: fluid index < threshold, impedance trending up  PMH: 1. Atrial fibrillation: Paroxysmal.  Diagnosed 4/14, DCCV 4/14 to NSR.  DCCV 1/16 to NSR.  DCCV 6/16 to NSR.  2. Chronic systolic CHF: Nonischemic cardiomyopathy.  LHC in 1998 with nonobstructive disease.  Cardiolite in 6/14 with no ischemia or infarction.  cMRI (5/16) with EF 34%, mildly dilated LV with diffuse HK worse in anterolateral wall, small punctate areas of LGE in anteroseptum and basal inferior wall (not CAD pattern).  TEE (6/16) with EF 15-20%.  He has Medtronic CRT-D device.  Echo (9/16) with EF 40%, moderate  LV dilation, grade II diastolic dysfunction, normal RV size and systolic function, PASP 42 mmHg.  3. Hyperlipidemia: Myalgias with atorvastatin, Livalo, and pravastatin.  4. CKD 5. OA: s/p THR.  6. H/o melanoma 7. Anemia 8. Diverticulosis  SH: Married with 3 children, lives in [place], retired, quit smoking in 2001.    FH: Mother with MI  ROS: All systems reviewed and negative except as per HPI.  Current Outpatient Prescriptions  Medication Sig Dispense Refill  . acetaminophen (TYLENOL) 500 MG tablet Take 1,000 mg by mouth every 4 (four) hours as needed for moderate pain or headache.    [name] amiodarone (PACERONE) 200 MG tablet Take 1 tablet (200 mg total) by mouth daily. 30 tablet 11  . carvedilol (COREG) 12.5 MG tablet Take 12.5 mg by mouth 2 (two) times daily with a meal.    . dorzolamide-timolol (COSOPT) 22.3-6.8 MG/ML ophthalmic solution Place 1 drop into the right eye 2 (two) times daily.    [name] escitalopram (LEXAPRO) 20 MG tablet Take 20 mg by mouth daily.    [name] ezetimibe (ZETIA) 10 MG tablet Take 1 tablet (10 mg total) by mouth daily. 90 tablet 3  . finasteride (PROSCAR) 5 MG tablet Take 5 mg by mouth every morning.     . furosemide (LASIX) 20 MG tablet Take 2 tablets (40 mg total) by mouth  daily. 180 tablet 1  . gabapentin (NEURONTIN) 100 MG capsule Take 200-300 mg by mouth See admin instructions. Take 2 capsules by mouth in the morning and 3 capsules by mouth at night    . lipase/protease/amylase (CREON) 36000 UNITS CPEP capsule Take 1 capsule (36,000 Units total) by mouth as needed. And with snacks 150 capsule 1  . NIASPAN 500 MG CR tablet Take 1,000 mg by mouth at bedtime.     [name] Glycol-Propyl Glycol (SYSTANE ULTRA OP) Place 1 drop into the left eye 2 (two) times daily.     . potassium chloride SA (K-DUR,KLOR-CON) 20 MEQ tablet Take 20 mEq by mouth 2 (two) times daily.    . rivaroxaban (XARELTO) 20 MG TABS tablet Take 1 tablet (20 mg total) by mouth daily with supper.  Restart tomorrow 02/11/2015 90 tablet 2  . sacubitril-valsartan (ENTRESTO) 24-26 MG Take 1 tablet by mouth 2 (two) times daily. 60 tablet 3  . spironolactone (ALDACTONE) 25 MG tablet Take 0.5 tablets (12.5 mg total) by mouth daily. 15 tablet 3  . tamsulosin (FLOMAX) 0.4 MG CAPS Take 0.4 mg by mouth at bedtime.     Alveda Reasons 20 MG TABS tablet Take 1 tablet by mouth  daily with supper 90 tablet 0   No current facility-administered medications for this encounter.   BP 122/74 mmHg  Pulse 63  Wt 202 lb (91.627 kg)  SpO2 97% General: NAD Neck: JVP 7 cm, no thyromegaly or thyroid nodule.  Lungs: Clear to auscultation bilaterally with normal respiratory effort. CV: Nondisplaced PMI.  Heart regular S1/S2, no S3/S4, no murmur.  No edema.  No carotid bruit.  Normal pedal pulses.  Abdomen: Soft, nontender, no hepatosplenomegaly, no distention.  Skin: Intact without lesions or rashes.  Neurologic: Alert and oriented x 3.  Psych: Normal affect. Extremities: No clubbing or cyanosis.  HEENT: Normal.   Assessment/Plan: 1. Chronic systolic CHF: Suspect nonischemic cardiomyopathy.  EF 15-20% on 6/16 TEE.  Cardiac MRI in 5/16 showed a LGE pattern that was not suggestive of coronary disease (?myocarditis or infiltrative disease).  There may be a component of tachycardia-mediated cardiomyopathy.  However, the cardiac MRI appears to have been done when he was in NSR.  Most recent echo showed EF increased to 40%.  He has Medtronic CRT-D device.  NYHA class II symptoms with good volume status by exam and Optivol.   - Continue current Lasix.  - Stop losartan, start Entresto 24/26 bid.  BMET in 2 wks.  - Continue Coreg and spironolactone.  2. Atrial fibrillation: Seems to tolerate poorly.  He is in NSR today.   - Continue Xarelto 20 daily.  - Decrease amiodarone to 200 mg daily. Recent LFTs and TSH normal.  He should have regular eye exams.  3. Hyperlipidemia: LDL remains quite high on Zetia.  He cannot take  statins.    Loralie Champagne 07/09/2015 7:20 PM

## 2015-07-18 ENCOUNTER — Telehealth (HOSPITAL_COMMUNITY): Payer: Self-pay | Admitting: *Deleted

## 2015-07-18 ENCOUNTER — Ambulatory Visit (INDEPENDENT_AMBULATORY_CARE_PROVIDER_SITE_OTHER): Payer: Medicare Other

## 2015-07-18 ENCOUNTER — Telehealth: Payer: Self-pay

## 2015-07-18 DIAGNOSIS — I5022 Chronic systolic (congestive) heart failure: Secondary | ICD-10-CM | POA: Diagnosis not present

## 2015-07-18 DIAGNOSIS — Z95810 Presence of automatic (implantable) cardiac defibrillator: Secondary | ICD-10-CM

## 2015-07-18 NOTE — Telephone Encounter (Signed)
Spoke with patient.

## 2015-07-18 NOTE — Progress Notes (Addendum)
EPIC Encounter for ICM Monitoring  Patient Name: Chad Brown is a 74 y.o. male Date: 07/18/2015 Primary Care Physican: Sherrie Mustache, MD Primary Cardiologist: [facility] ([facility]) Electrophysiologist: Allred Dry Weight: 197 lb    Bi-V Pacing 98.1%     In the past month, have you:  1. Gained more than 2 pounds in a day or more than 5 pounds in a week? no  2. Had changes in your medications (with verification of current medications)? Yes, Dr Aundra Dubin stopped Losartan and started Entresto 24/26 mg bid.  Amiodarone decreased to 200 mg daily.  3. Had more shortness of breath than is usual for you? no  4. Limited your activity because of shortness of breath? no  5. Not been able to sleep because of shortness of breath? no  6. Had increased swelling in your feet or ankles? no  7. Had symptoms of dehydration (dizziness, dry mouth, increased thirst, decreased urine output) no  8. Had changes in sodium restriction? no  9. Been compliant with medication? Yes   ICM trend:  Follow-up plan: ICM clinic phone appointment on 08/18/2015.  Optivol impedance trending along baseline since ~06/19/2015 and he denied any symptoms at this time.          He is checking BP 4 x day and worried it is too low at times.  He has some readings as low as 73/47 and experiences dizziness.  He stated the low BP readings after he started Entresto.              Advised will forward information regarding low BP's for physicians review and if any recommendations will call him back.  Advised if BP continues to be low and experiences dizziness or other symptoms to call physician office.   Copy of note sent to patient's primary care physician, primary cardiologist, and device following physician.  Rosalene Billings, RN, CCM 07/18/2015 11:34 AM

## 2015-07-18 NOTE — Telephone Encounter (Signed)
ICM transmission received.  Attempted patient call and left message for return call.  

## 2015-07-18 NOTE — Telephone Encounter (Signed)
Completed PA for pt's Entresto 24/26 mg , med approved through 07/07/16

## 2015-07-20 ENCOUNTER — Telehealth (HOSPITAL_COMMUNITY): Payer: Self-pay | Admitting: *Deleted

## 2015-07-20 ENCOUNTER — Ambulatory Visit (HOSPITAL_COMMUNITY)
Admission: RE | Admit: 2015-07-20 | Discharge: 2015-07-20 | Disposition: A | Discharge status: Home / Self Care | Payer: Medicare Other | Source: Ambulatory Visit | Attending: Hematology & Oncology | Admitting: Hematology & Oncology

## 2015-07-20 DIAGNOSIS — K862 Cyst of pancreas: Secondary | ICD-10-CM

## 2015-07-20 DIAGNOSIS — K5730 Diverticulosis of large intestine without perforation or abscess without bleeding: Secondary | ICD-10-CM | POA: Diagnosis not present

## 2015-07-20 DIAGNOSIS — N281 Cyst of kidney, acquired: Secondary | ICD-10-CM | POA: Diagnosis not present

## 2015-07-20 DIAGNOSIS — K8020 Calculus of gallbladder without cholecystitis without obstruction: Secondary | ICD-10-CM | POA: Insufficient documentation

## 2015-07-20 MED ORDER — IOHEXOL 300 MG/ML  SOLN
100.0000 mL | Freq: Once | INTRAMUSCULAR | Status: AC | PRN
  Administered 2015-07-20: 100 mL via INTRAVENOUS

## 2015-07-20 NOTE — Telephone Encounter (Signed)
Routing to [employer]

## 2015-07-20 NOTE — Progress Notes (Signed)
Sounds like BP is running very low at times.  Would have him stop Entresto, go back on losartan at his prior dose.  Will need to get BMET.

## 2015-07-20 NOTE — Telephone Encounter (Signed)
Pt returned call from Elsie Lincoln had called to let pt know to stop his Entresto due to his BP low and dizziness.   Pt said that his BP had been running low but he was feeling ok. If any dizziness it only lasted a few minutes and then it was gone.  Pt keeps a record of his BP everyday and checks it 4 times a day.  Since starting on the 8th of October when he started Entresto his BP have been ok. Before starting Entresto his pressures were running 88/59 and 87/60.  Some of the readings were: 10-8 93/64; 10-9 89/61, 83/53, 118/76; 10-10 100/66 was the lowest reading; 10-11 103/66 lowest; 10-12 88/57 lowest; 10-13 73/47 lowest; 10-14 98/64, 77/49; 10-15 93/59 lowest; 10-16 79/52 lowest; 10-17 95/60 lowest; 10-18 91/55 lowest; 10-19 95/64, 112/64 Pt stated that he was fine even to the point where he went and played golf yesterday. Told pt that I would let Dr Aundra Dubin know and see if he still wanted him to continue the medication since he was ok. He gets labs drawn tomorrow at [facility].

## 2015-07-20 NOTE — Progress Notes (Signed)
Attempted to call pt with recommendations, Left message to call back   

## 2015-07-21 ENCOUNTER — Encounter (HOSPITAL_COMMUNITY): Payer: Medicare Other | Attending: Hematology & Oncology | Admitting: Hematology & Oncology

## 2015-07-21 ENCOUNTER — Encounter (HOSPITAL_COMMUNITY): Payer: Self-pay | Admitting: Hematology & Oncology

## 2015-07-21 VITALS — BP 137/73 | HR 67 | Temp 98.0°F | Resp 16 | Wt 205.0 lb

## 2015-07-21 DIAGNOSIS — D696 Thrombocytopenia, unspecified: Secondary | ICD-10-CM | POA: Diagnosis not present

## 2015-07-21 DIAGNOSIS — C439 Malignant melanoma of skin, unspecified: Secondary | ICD-10-CM | POA: Insufficient documentation

## 2015-07-21 DIAGNOSIS — Z85820 Personal history of malignant melanoma of skin: Secondary | ICD-10-CM

## 2015-07-21 DIAGNOSIS — D500 Iron deficiency anemia secondary to blood loss (chronic): Secondary | ICD-10-CM | POA: Diagnosis not present

## 2015-07-21 DIAGNOSIS — I5022 Chronic systolic (congestive) heart failure: Secondary | ICD-10-CM

## 2015-07-21 DIAGNOSIS — D649 Anemia, unspecified: Secondary | ICD-10-CM | POA: Insufficient documentation

## 2015-07-21 LAB — CBC WITH DIFFERENTIAL/PLATELET
BASOS ABS: 0 10*3/uL (ref 0.0–0.1)
Basophils Relative: 0 %
EOS ABS: 0.1 10*3/uL (ref 0.0–0.7)
EOS PCT: 2 %
HCT: 36.1 % — ABNORMAL LOW (ref 39.0–52.0)
Hemoglobin: 12 g/dL — ABNORMAL LOW (ref 13.0–17.0)
LYMPHS ABS: 1.1 10*3/uL (ref 0.7–4.0)
Lymphocytes Relative: 21 %
MCH: 30.5 pg (ref 26.0–34.0)
MCHC: 33.2 g/dL (ref 30.0–36.0)
MCV: 91.6 fL (ref 78.0–100.0)
Monocytes Absolute: 0.4 10*3/uL (ref 0.1–1.0)
Monocytes Relative: 8 %
Neutro Abs: 3.6 10*3/uL (ref 1.7–7.7)
Neutrophils Relative %: 69 %
PLATELETS: 110 10*3/uL — AB (ref 150–400)
RBC: 3.94 MIL/uL — AB (ref 4.22–5.81)
RDW: 16.3 % — ABNORMAL HIGH (ref 11.5–15.5)
WBC: 5.3 10*3/uL (ref 4.0–10.5)

## 2015-07-21 LAB — FERRITIN: FERRITIN: 65 ng/mL (ref 24–336)

## 2015-07-21 LAB — BRAIN NATRIURETIC PEPTIDE: B Natriuretic Peptide: 215 pg/mL — ABNORMAL HIGH (ref 0.0–100.0)

## 2015-07-21 NOTE — Patient Instructions (Addendum)
[place] at [facility] Discharge Instructions  RECOMMENDATIONS MADE BY THE CONSULTANT AND ANY TEST RESULTS WILL BE SENT TO YOUR REFERRING PHYSICIAN.  Labs today Return in 6 months  Thank you for choosing [place] at [facility] to provide your oncology and hematology care.  To afford each patient quality time with our provider, please arrive at least 15 minutes before your scheduled appointment time.    You need to re-schedule your appointment should you arrive 10 or more minutes late.  We strive to give you quality time with our providers, and arriving late affects you and other patients whose appointments are after yours.  Also, if you no show three or more times for appointments you may be dismissed from the clinic at the providers discretion.     Again, thank you for choosing [facility].  Our hope is that these requests will decrease the amount of time that you wait before being seen by our physicians.       _____________________________________________________________  Should you have questions after your visit to [facility], please contact our office at [phone] between the hours of 8:30 a.m. and 4:30 p.m.  Voicemails left after 4:30 p.m. will not be returned until the following business day.  For prescription refill requests, have your pharmacy contact our office.

## 2015-07-21 NOTE — Progress Notes (Signed)
.  Chad Brown's reason for visit today are for labs as scheduled per MD orders.  Venipuncture performed with a 23 gauge butterfly needle to R Antecubital.  Chad Brown tolerated venipuncture well and without incident; questions were answered and patient was discharged.

## 2015-07-21 NOTE — Progress Notes (Signed)
[facility] [place] NOTE  Patient Care Team: Chad Housekeeper, MD as PCP - General (Family Medicine)  [address] / [place]   CHIEF COMPLAINTS/PURPOSE OF CONSULTATION:  T2a N0 Melanoma of the L Thigh s/p WLE and sentinel node with Dr. Freddi [place] Abnormal CT and MRI imaging of the abdomen Anemia Iron deficiency on oral iron and requiring intermittent IV iron replacement  HISTORY OF PRESENTING ILLNESS:  Chad [facility] 74 y.o. male is here because of a history of melanoma and abnormal imaging of the abdomen. He follows with Dr. Edrick [facility] for his primary medical care.  He is also followed for an iridociliary lesion of the left eye which is longstanding that has been diagnosed as either a nevus vs.a slow growing melanoma.   He was seen by Dr. Freddi [facility] at [facility] for a T2a melanoma of the left thigh diagnosed in 06/2014. He underwent a WLE and sentinel node procedure on 06/18/2014 with negative sentinel nodes. He has an established follow-up schedule with Dr. Freddi [place] and Dr. [place] his local dermatologist.  Because of the findings in his eye he underwent a CT of the abdomen and pelvis with contrast on September 5 of 2014. There was an abnormality noted along the ventral surface of the pancreatic head. Follow-up imaging was recommended. A borderline enlarged right external iliac lymph node was noted at 1.2 cm. Repeat CT imaging was done on 12/18/2013 and was stable.   He is doing better from a cardiac perspective. He has done well with IV iron in the past, he is intolerant of oral iron. He is here today for repeat labs and to review repeat imaging studies of the abdomen.    He complains that sometimes his eye feels like it has "trash in it".  This is usually alleviated with eye dorps.  He visits [facility] for optometry care.  He reports that his BP has been unstable but it has improved lately.  He actively checks his MyChart.  He has had his flu vaccination.  Activity  level has increased some. He denies nausea/vomiting or abdominal Brown. No BRBPR or melena.  MEDICAL HISTORY:  Past Medical History  Diagnosis Date  . Hyperlipidemia   . Hypertension   . Chronic systolic CHF (congestive heart failure) ([facility])     a. New dx 12/2012 ? NICM, may be r/t afib. b. Nuc 03/2013 - normal;  c. 03/2015 TEE EF 15-20%.  [name] PAF (paroxysmal atrial fibrillation) ([place])     a. Dx 12/2012, s/p TEE/DCCV 01/26/13. b. On Xarelto (CHA2DS2VASc = 3);  c. 03/2015 TEE (EF 15-20%, no LAA thrombus) and DCCV - amio increased to 200 mg bid.  . Anemia     iron deficient  . Cholelithiasis   . Atherosclerosis   . Colon polyp, hyperplastic 5/16    removed precancerous lesions  . AICD (automatic cardioverter/defibrillator) present 03/08/2015    MDT CRTD   . Chronic systolic CHF (congestive heart failure) ([facility]ad [facility])   . Myocardial infarction ([place]) 1998  . Arthritis     "about all my joints; hands, knees, back" (03/08/2015)  . Urinary hesitancy due to benign prostatic hypertrophy   . Melanoma of eye ([place]) 2000's    "right; it's never been biopsied"  . Melanoma of lower leg ([place]) 2015    "left; right at my knee"  . Adenomatous colon polyp     tubular  . Diverticulosis     SURGICAL HISTORY: Past Surgical History  Procedure Laterality  Date  . Back surgery    . Total hip arthroplasty Right 06/2007  . Cataract extraction Right ~ 2006  . Glaucoma surgery Right ~ 2006    "put 3 stents in to drain fluid" (03/08/2015)  . Refractive surgery Right ~ 2006 X 2    "twice; both done at [facility]" (03/08/2015  . Surgery scrotal / testicular Right 1990's  . Incision and drainage abscess posterior cervicalspine  05/2012  . Cardiac catheterization  1998  . Tee without cardioversion N/A 01/26/2013    Procedure: TRANSESOPHAGEAL ECHOCARDIOGRAM (TEE);  Surgeon: Chad Perla, MD;  Location: [facility];  Service: Cardiovascular;  Laterality: N/A;  Chad [facility] anes. /   . Cardioversion N/A 01/26/2013    Procedure:  CARDIOVERSION;  Surgeon: Chad Perla, MD;  Location: [facility];  Service: Cardiovascular;  Laterality: N/A;  . Tee without cardioversion N/A 10/05/2014    Procedure: TRANSESOPHAGEAL ECHOCARDIOGRAM (TEE)  with cardioversion;  Surgeon: Chad Headings, MD;  Location: [facility];  Service: Cardiovascular;  Laterality: N/A;  12:52 synched cardioversion at 120 joules,...afib to SR...12 lead EKG ordered.Marland KitchenMarland KitchenCardiozem d/c'ed per MD verbal order at [facility]  . Melanoma excision Left 2015    "lower leg; right at my knee"  . Colonoscopy with propofol N/A 02/10/2015    Procedure: COLONOSCOPY WITH PROPOFOL;  Surgeon: Chad Mayer, MD;  Location: [facility];  Service: [facility];  Laterality: N/A;  . Joint replacement    . Thoracic spine surgery  03/2000    "ground calcium deposits from upper thoracic" (01/26/2013)  . Ep implantable device N/A 03/08/2015    MDT [facility] CRT-D for nonischemic CM by Dr Chad [facility] for primary prevention  . Cardioversion N/A 03/23/2015    Procedure: CARDIOVERSION;  Surgeon: Chad Pain, MD;  Location: [place];  Service: Cardiovascular;  Laterality: N/A;  . Tee without cardioversion N/A 03/23/2015    Procedure: TRANSESOPHAGEAL ECHOCARDIOGRAM (TEE);  Surgeon: Chad Pain, MD;  Location: [facility];  Service: Cardiovascular;  Laterality: N/A;    SOCIAL HISTORY: Social History   Social History  . Marital Status: Married    Spouse Name: N/A  . Number of Children: 3  . Years of Education: N/A   Occupational History  . retired    Social History Main Topics  . Smoking status: Former Smoker -- 3.00 packs/day for 48 years    Types: Cigarettes    Quit date: 09/28/2000  . Smokeless tobacco: Never Used  . Alcohol Use: No  . Drug Use: No  . Sexual Activity: Yes   Other Topics Concern  . Not on file   Social History Narrative   Pt lives in [place] with spouse.  3 children are grown and healthy.   Retired.  Ran a country store for 30 years, previously worked in  the [employer] for 16 years   Married for 38 years Born in [place]. He was a Scientist, water quality, Therapist, occupational, Radio broadcast assistant. 3 sons and 3 grandchildren  Quit smoking 14 years ago Plays golf.  FAMILY HISTORY: Family History  Problem Relation Age of Onset  . Kidney disease Mother   . Heart disease Mother     MI, open heart  . Diabetes Mother     dialysis  . Leukemia Father   . Colon cancer Paternal Uncle   . Lung cancer Paternal Uncle     x 2  . Prostate cancer Paternal Uncle   . Diabetes Maternal Grandmother   .  Heart attack Maternal Uncle   . Diabetes Maternal Aunt     x 3  . Diabetes Maternal Uncle    indicated that his mother is deceased. He indicated that his father is deceased.    Father passed away at 10 of leukemia  Mother passed away at 80 of diabetes, kidney disease and CHF.  1 brother and 2 sisters. Sister had throat cancer. Another sister had skin cancer.  ALLERGIES:  is allergic to pravastatin sodium and zithromax.  MEDICATIONS:  Current Outpatient Prescriptions  Medication Sig Dispense Refill  . acetaminophen (TYLENOL) 500 MG tablet Take 1,000 mg by mouth every 4 (four) hours as needed for moderate Brown or headache.    [name] amiodarone (PACERONE) 200 MG tablet Take 1 tablet (200 mg total) by mouth daily. 30 tablet 11  . carvedilol (COREG) 12.5 MG tablet Take 12.5 mg by mouth 2 (two) times daily with a meal.    . dorzolamide-timolol (COSOPT) 22.3-6.8 MG/ML ophthalmic solution Place 1 drop into the right eye 2 (two) times daily.    [name] escitalopram (LEXAPRO) 20 MG tablet Take 20 mg by mouth daily.    [name] ezetimibe (ZETIA) 10 MG tablet Take 1 tablet (10 mg total) by mouth daily. 90 tablet 3  . finasteride (PROSCAR) 5 MG tablet Take 5 mg by mouth every morning.     . furosemide (LASIX) 20 MG tablet Take 2 tablets (40 mg total) by mouth daily. 180 tablet 1  . gabapentin (NEURONTIN) 100 MG capsule Take 200-300 mg by mouth  See admin instructions. Take 2 capsules by mouth in the morning and 3 capsules by mouth at night    . lipase/protease/amylase (CREON) 36000 UNITS CPEP capsule Take 1 capsule (36,000 Units total) by mouth as needed. And with snacks 150 capsule 1  . NIASPAN 500 MG CR tablet Take 1,000 mg by mouth at bedtime.     [name] Glycol-Propyl Glycol (SYSTANE ULTRA OP) Place 1 drop into the left eye 2 (two) times daily.     . potassium chloride SA (K-DUR,KLOR-CON) 20 MEQ tablet Take 20 mEq by mouth 2 (two) times daily.    . rivaroxaban (XARELTO) 20 MG TABS tablet Take 1 tablet (20 mg total) by mouth daily with supper. Restart tomorrow 02/11/2015 90 tablet 2  . sacubitril-valsartan (ENTRESTO) 24-26 MG Take 1 tablet by mouth 2 (two) times daily. 60 tablet 3  . spironolactone (ALDACTONE) 25 MG tablet Take 0.5 tablets (12.5 mg total) by mouth daily. 15 tablet 3  . tamsulosin (FLOMAX) 0.4 MG CAPS Take 0.4 mg by mouth at bedtime.     Alveda Reasons 20 MG TABS tablet Take 1 tablet by mouth  daily with supper 90 tablet 0   No current facility-administered medications for this visit.    Review of Systems  Constitutional: Negative for fever, chills, weight loss and malaise/fatigue.  HENT: Negative for congestion, hearing loss, nosebleeds, sore throat and tinnitus.   Eyes: Negative for blurred vision, double vision, Brown and discharge.  Respiratory: Negative for cough, hemoptysis, sputum production, shortness of breath and wheezing.   Cardiovascular: Negative for chest Brown, palpitations, claudication, leg swelling and PND. Recent pacemaker placement Gastrointestinal: Negative for heartburn, nausea, vomiting, abdominal Brown, diarrhea, constipation, blood in stool and melena.  Genitourinary: Negative for frequency. Negative for dysuria, urgency and hematuria.  Musculoskeletal: Negative for myalgias, joint Brown and falls. Positive for back Brown Skin: Negative for itching and rash.  Neurological: Negative for dizziness,  tingling, tremors, sensory change, speech change,  focal weakness, seizures, loss of consciousness, weakness and headaches.  Endo/Heme/Allergies: Does not bruise/bleed easily.  Psychiatric/Behavioral: Negative for suicidal ideas, memory loss and substance abuse. The patient is not nervous/anxious and does not have insomnia.  14 point ROS was done and is otherwise as detailed above or in HPI   PHYSICAL EXAMINATION: ECOG PERFORMANCE STATUS: 0 - Asymptomatic  Filed Vitals:   07/21/15 1200  BP: 137/73  Pulse: 67  Temp: 98 F (36.7 C)  Resp: 16   Filed Weights   07/21/15 1200  Weight: 205 lb (92.987 kg)     Physical Exam  Constitutional: He is oriented to person, place, and time and well-developed, well-nourished, and in no distress.  Positive for anxiety/depression. HENT:  Head: Normocephalic and atraumatic.  Nose: Nose normal.  Mouth/Throat: Oropharynx is clear and moist. No oropharyngeal exudate.  Eyes: Conjunctivae and EOM are normal. Pupils are equal, round, and reactive to light. Right eye exhibits no discharge. Left eye exhibits no discharge. No scleral icterus.  Lower portion of right iris has abnormal shape and color  Neck: Normal range of motion. Neck supple. No tracheal deviation present. No thyromegaly present.  Cardiovascular: Normal rate, regular rhythm and normal heart sounds.  Exam reveals no gallop and no friction rub.   No murmur heard. Pulmonary/Chest: Effort normal and breath sounds normal. He has no wheezes. He has no rales.  Abdominal: Soft. Bowel sounds are normal. He exhibits no distension and no mass. There is no tenderness. There is no rebound and no guarding.  Musculoskeletal: Normal range of motion. He exhibits no edema.  On Inferior aspect of his incision the left thigh there is an area of palpable firmness. (patient notes this is chronic) Remainder of surgical site is without abnormality. No other palpable nodules noted. L groin without palpable  abnormality  Lymphadenopathy:    He has no cervical adenopathy.  Neurological: He is alert and oriented to person, place, and time. He has normal reflexes. No cranial nerve deficit. Gait normal. Coordination normal.  Skin: Skin is warm and dry. No rash noted.  Psychiatric: Mood, memory, affect and judgment normal.  Nursing note and vitals reviewed.   LABORATORY DATA:  I have reviewed the data as listed Lab Results  Component Value Date   WBC 6.0 04/19/2015   HGB 13.0 04/19/2015   HCT 40.0 04/19/2015   MCV 88.3 04/19/2015   PLT 123* 04/19/2015    RADIOGRAPHIC STUDIES: I have personally reviewed the radiological images as listed and agreed with the findings in the report. CLINICAL DATA: Follow up pancreatic lesions. No current complaints. History of melanoma. Patient now has a pacemaker. Subsequent encounter.  EXAM: CT ABDOMEN AND PELVIS WITH CONTRAST  TECHNIQUE: Multidetector CT imaging of the abdomen and pelvis was performed using the standard protocol following bolus administration of intravenous contrast.  CONTRAST: 181mL OMNIPAQUE IOHEXOL 300 MG/ML SOLN  COMPARISON: CT 12/18/2013 and 06/05/2013. MRI 01/03/2015.  FINDINGS: Lower chest: Clear lung bases. No significant pleural or pericardial effusion. Cardiac pacemaker noted.  Hepatobiliary: Tiny low-density hepatic lesions are unchanged, likely cysts. No worrisome hepatic findings. There are small calcified gallstones. No gallbladder wall thickening or biliary dilatation.  Pancreas: The pancreas has a stable appearance by CT. The 1.5 cm low-density lesion projecting anteriorly from the pancreatic head on image 16 is unchanged. The other tiny lesions on MRI are not clearly visualized. There is no evidence of pancreatic ductal dilatation or surrounding inflammation.  Spleen: Normal in size without focal abnormality.  Adrenals/Urinary Tract:  Both adrenal glands appear normal. There are stable  bilateral renal cysts. No evidence of renal mass, hydronephrosis or urinary tract calculus. The bladder appears unremarkable.  Stomach/Bowel: There are diverticular changes throughout the distal colon with some mural thickening in the sigmoid colon. No evidence of surrounding inflammation, obstruction or extraluminal fluid collection. The appendix appears normal.  Vascular/Lymphatic: There are no enlarged abdominal or pelvic lymph nodes. There are stable small lymph nodes within the porta hepatis. Atherosclerosis of the aorta, its branches and the iliac arteries again noted.  Reproductive: The prostate gland and seminal vesicles appear unchanged.  Other: No evidence of abdominal wall mass or hernia.  Musculoskeletal: No acute or significant osseous findings. Right hip arthroplasty and diffuse lumbar spondylosis noted.  IMPRESSION: 1. The pancreas has a stable appearance compared with prior CTs dating back to 06/05/2013. The small cystic lesions are therefore considered benign and incidental. 2. No acute findings. 3. Stable incidental findings including cholelithiasis, bilateral renal cysts, atherosclerosis and colonic diverticulosis.   Electronically Signed  By: Richardean Sale M.D.  On: 07/20/2015 12:38    ASSESSMENT & PLAN:  T2a N0 Melanoma of the L Thigh s/p WLE and sentinel node with Dr. Freddi [facility] Abnormal CT and MRI imaging of the abdomen Anemia/Thrombocytopenia Colonoscopy 02/10/2015 with 5 polyps and sigmoid diverticulosis  His anemia is much improved, he is intolerant of oral iron. If he still remains iron deficient and requires ongoing iron replacement, I am going to refer him back to GI. We will arrange IV iron replacemen for him if needed.[name] He is agreeable to IV iron as he feels he cannot tolerate the oral preparations. The most likely cause of his anemia is due to chronic blood loss/malabsorption syndrome. Goal is to keep ferritin level greater than or  equal to 100 ng/ml  Thrombocytopenia is fairly stable. We will continue to monitor this.  On exam there is no obvious evidence of recurrence of his melanoma.CT imaging reveals stability of the pancreas dating back 2 years.  RTC in 6 months.   Orders Placed This Encounter  Procedures  . CBC with Differential  . Ferritin  . Brain natriuretic peptide  . CBC with Differential    Standing Status: Future     Number of Occurrences:      Standing Expiration Date: 07/20/2016  . Comprehensive metabolic panel    Standing Status: Future     Number of Occurrences:      Standing Expiration Date: 07/20/2016  . Ferritin    Standing Status: Future     Number of Occurrences:      Standing Expiration Date: 07/20/2016  . Iron and TIBC    Standing Status: Future     Number of Occurrences:      Standing Expiration Date: 07/20/2016    All questions were answered. The patient knows to call the clinic with any problems, questions or concerns.   This document serves as a record of services personally performed by Ancil Linsey, MD. It was created on her behalf by Janace Hoard, a trained medical scribe. The creation of this record is based on the scribe's personal observations and the provider's statements to them. This document has been checked and approved by the attending provider.    I have reviewed the above documentation for accuracy and completeness, and I agree with the above.  This note was electronically signed.  Kelby Fam. Whitney Muse, MD

## 2015-07-22 ENCOUNTER — Telehealth: Payer: Self-pay | Admitting: Internal Medicine

## 2015-07-22 ENCOUNTER — Other Ambulatory Visit (HOSPITAL_COMMUNITY): Payer: Self-pay | Admitting: Hematology & Oncology

## 2015-07-22 DIAGNOSIS — D509 Iron deficiency anemia, unspecified: Secondary | ICD-10-CM

## 2015-07-22 NOTE — Telephone Encounter (Signed)
Pt will continue Entresto

## 2015-07-22 NOTE — Telephone Encounter (Signed)
Patient had labs at his primary and has a continued anemia despite IV iron.  Dr. Lyanne Co requested he requtn to GI.  Recent colon this summer.  Dr. Carlean Purl does he need any additional testing prior to office visit.  Next available is December. Didn't know if anything can be done over the phone.

## 2015-07-25 NOTE — Telephone Encounter (Signed)
I think he will need an appointment but have him do stool guaiacs now while we wait.  December appt ok at this point - if he is heme + I may change things

## 2015-07-25 NOTE — Telephone Encounter (Signed)
Patient notified of the recommendations He is scheduled for a office visit for 09/09/15 I mailed him hemoccult cards

## 2015-07-29 ENCOUNTER — Encounter (HOSPITAL_BASED_OUTPATIENT_CLINIC_OR_DEPARTMENT_OTHER): Payer: Medicare Other

## 2015-07-29 VITALS — BP 143/64 | HR 63 | Temp 98.0°F | Resp 16

## 2015-07-29 DIAGNOSIS — D649 Anemia, unspecified: Secondary | ICD-10-CM

## 2015-07-29 MED ORDER — SODIUM CHLORIDE 0.9 % IV SOLN
INTRAVENOUS | Status: DC
  Administered 2015-07-29: 14:00:00 via INTRAVENOUS

## 2015-07-29 MED ORDER — SODIUM CHLORIDE 0.9 % IV SOLN
125.0000 mg | Freq: Once | INTRAVENOUS | Status: AC
  Administered 2015-07-29: 125 mg via INTRAVENOUS
  Filled 2015-07-29: qty 10

## 2015-07-29 NOTE — Progress Notes (Signed)
Patient and I discussed his new blood pressure medication, Entresto (x39month), as well as changes and increases in dosing of his diuretics.  His blood pressure has been running low at the house when he checks it, it is below baseline today, and he has been feeling dizzy and lightheaded at times.  I encouraged him to call his cardiologist as well as the MD that prescribed the Lasix and increased the dose around the same time that he started the [facility].  I explained to him the effect of a diuretic on his blood pressure and the danger of orthostatic hypotension as well as hypotension in general.  He and his wife verbalized understanding and state that they will call the MD regarding this.   Patient tolerated his infusion today, VSS post infusion.

## 2015-07-29 NOTE — Patient Instructions (Signed)
[place] at [facility] Discharge Instructions  RECOMMENDATIONS MADE BY THE CONSULTANT AND ANY TEST RESULTS WILL BE SENT TO YOUR REFERRING PHYSICIAN.  IV Iron today. Please follow up with your cardiologist and CHF specialist regarding your low blood pressure.   Return next Friday for Iron as scheduled.    Thank you for choosing [place] at [facility] to provide your oncology and hematology care.  To afford each patient quality time with our provider, please arrive at least 15 minutes before your scheduled appointment time.    You need to re-schedule your appointment should you arrive 10 or more minutes late.  We strive to give you quality time with our providers, and arriving late affects you and other patients whose appointments are after yours.  Also, if you no show three or more times for appointments you may be dismissed from the clinic at the providers discretion.     Again, thank you for choosing [facility].  Our hope is that these requests will decrease the amount of time that you wait before being seen by our physicians.       _____________________________________________________________  Should you have questions after your visit to [facility], please contact our office at [phone] between the hours of 8:30 a.m. and 4:30 p.m.  Voicemails left after 4:30 p.m. will not be returned until the following business day.  For prescription refill requests, have your pharmacy contact our office.

## 2015-08-01 ENCOUNTER — Other Ambulatory Visit: Payer: Self-pay | Admitting: Internal Medicine

## 2015-08-01 ENCOUNTER — Telehealth (HOSPITAL_COMMUNITY): Payer: Self-pay

## 2015-08-01 NOTE — Telephone Encounter (Signed)
If these low BPs have been persisting ever since starting Entresto, would go back to losartan 50 qam/25 qpm for now.  Call in again with BP readings in a week.

## 2015-08-01 NOTE — Telephone Encounter (Signed)
Patient says that his BP started dropping after he started taking the [facility].  Instructed patient to go back to losartan 50 mg every AM and 25 mg every PM.  Call again in one week with BP readings   Patient repeated instructions correctly

## 2015-08-01 NOTE — Telephone Encounter (Signed)
Patient called with c/o low blood pressure readings.  Feels dizzy and light headed when his BP readings are low.  Date  AM  Noon  Afternoon  Evening  10/26  87/58  70/55  120/69   86/56 10/27  104/69  82/56  116/78   117/81 10/28  78/52  77/52  127/82   91/61 10/29  94/65  73/49  86/60   110/70 10/30  104/69  105/71  135/82   106/58 10/31  97/67  86/62

## 2015-08-05 ENCOUNTER — Encounter (HOSPITAL_COMMUNITY): Payer: Medicare Other | Attending: Hematology & Oncology

## 2015-08-05 ENCOUNTER — Encounter (HOSPITAL_COMMUNITY): Payer: Self-pay

## 2015-08-05 DIAGNOSIS — C439 Malignant melanoma of skin, unspecified: Secondary | ICD-10-CM | POA: Insufficient documentation

## 2015-08-05 DIAGNOSIS — D649 Anemia, unspecified: Secondary | ICD-10-CM | POA: Diagnosis not present

## 2015-08-05 MED ORDER — SODIUM CHLORIDE 0.9 % IV SOLN
125.0000 mg | Freq: Once | INTRAVENOUS | Status: AC
  Administered 2015-08-05: 125 mg via INTRAVENOUS
  Filled 2015-08-05: qty 10

## 2015-08-05 MED ORDER — SODIUM CHLORIDE 0.9 % IV SOLN
INTRAVENOUS | Status: DC
  Administered 2015-08-05: 13:00:00 via INTRAVENOUS

## 2015-08-05 NOTE — Patient Instructions (Signed)
[place] at [facility] Discharge Instructions  RECOMMENDATIONS MADE BY THE CONSULTANT AND ANY TEST RESULTS WILL BE SENT TO YOUR REFERRING PHYSICIAN.  Iron infusion today Return as scheduled Please call the clinic if you have any questions or concerns  Thank you for choosing [place] at [facility] to provide your oncology and hematology care.  To afford each patient quality time with our provider, please arrive at least 15 minutes before your scheduled appointment time.    You need to re-schedule your appointment should you arrive 10 or more minutes late.  We strive to give you quality time with our providers, and arriving late affects you and other patients whose appointments are after yours.  Also, if you no show three or more times for appointments you may be dismissed from the clinic at the providers discretion.     Again, thank you for choosing [facility].  Our hope is that these requests will decrease the amount of time that you wait before being seen by our physicians.       _____________________________________________________________  Should you have questions after your visit to [facility], please contact our office at [phone] between the hours of 8:30 a.m. and 4:30 p.m.  Voicemails left after 4:30 p.m. will not be returned until the following business day.  For prescription refill requests, have your pharmacy contact our office.

## 2015-08-05 NOTE — Progress Notes (Signed)
Chad Brown Tolerated iron infusion well today Discharged ambulatory

## 2015-08-08 ENCOUNTER — Encounter: Payer: Self-pay | Admitting: Internal Medicine

## 2015-08-09 ENCOUNTER — Other Ambulatory Visit: Payer: Medicare Other

## 2015-08-09 ENCOUNTER — Encounter (HOSPITAL_BASED_OUTPATIENT_CLINIC_OR_DEPARTMENT_OTHER): Payer: Medicare Other

## 2015-08-09 DIAGNOSIS — D509 Iron deficiency anemia, unspecified: Secondary | ICD-10-CM

## 2015-08-09 LAB — HEMOCCULT SLIDES (X 3 CARDS)
FECAL OCCULT BLD: NEGATIVE
OCCULT 1: POSITIVE — AB
OCCULT 2: POSITIVE — AB
OCCULT 3: NEGATIVE
OCCULT 4: NEGATIVE
OCCULT 5: NEGATIVE

## 2015-08-09 NOTE — Progress Notes (Signed)
Quick Note:  He is heme + so recommend that he have an enteroscopy at hospital but should hold Xarelto 1 d before EGD if ok with Dr. Johnsie Cancel I can do at [facility] 11/16 to follow other cases (mod sedation ok) if he is ok with that date etc Please let me know    ______

## 2015-08-10 ENCOUNTER — Other Ambulatory Visit: Payer: Self-pay

## 2015-08-10 ENCOUNTER — Telehealth: Payer: Self-pay

## 2015-08-10 DIAGNOSIS — R195 Other fecal abnormalities: Secondary | ICD-10-CM

## 2015-08-10 NOTE — Telephone Encounter (Signed)
August 10, 2015   DUSTAN REIS[place] [address]   Dear Dr. Johnsie Cancel,  Mr. Cornforth is scheduled for an enteroscopy with Dr. Carlean Purl on 08/17/15 to evaluate his heme + stools.  He is on xeralto therapy.  Dr. Carlean Purl requests permission to hold xeralto for 1 day prior to his procedure.    Please route your response to Barb Merino, RN at [facility].     Thank you,  Barb Merino, RN

## 2015-08-11 NOTE — Telephone Encounter (Signed)
Message     Ok to hold xarelto prior to procedure     Message above per Dr. Johnsie Cancel.   Patient notified to hold xeralto starting 08/16/15.  He verbalized understanding.

## 2015-08-14 ENCOUNTER — Ambulatory Visit (HOSPITAL_BASED_OUTPATIENT_CLINIC_OR_DEPARTMENT_OTHER): Payer: Medicare Other | Attending: Cardiology | Admitting: *Deleted

## 2015-08-14 VITALS — Ht 66.0 in | Wt 197.0 lb

## 2015-08-14 DIAGNOSIS — I493 Ventricular premature depolarization: Secondary | ICD-10-CM | POA: Insufficient documentation

## 2015-08-14 DIAGNOSIS — R0683 Snoring: Secondary | ICD-10-CM | POA: Diagnosis not present

## 2015-08-14 DIAGNOSIS — G4733 Obstructive sleep apnea (adult) (pediatric): Secondary | ICD-10-CM | POA: Diagnosis not present

## 2015-08-15 ENCOUNTER — Telehealth: Payer: Self-pay | Admitting: Cardiology

## 2015-08-15 NOTE — Addendum Note (Signed)
Addended by: Fransico Him R on: 08/15/2015 10:53 AM   Modules accepted: Orders

## 2015-08-15 NOTE — Sleep Study (Signed)
Patient Name: Chad Brown, Chad Brown MRN: [id number] Study Date: 08/14/2015 Gender: Male D.O.B: 1941-05-04 Age (years): 19 Referring Provider: Thompson Grayer Interpreting Physician: Fransico Him MD, ABSM RPSGT: Neeriemer, Holly  Weight (lbs): 195 Height (inches): 67BMI: 31 Neck Size: 16.00  CLINICAL INFORMATION Sleep Study Type: Split Night CPAP Indication for sleep study: OSA Epworth Sleepiness Score: 11  SLEEP STUDY TECHNIQUE As per the AASM Manual for the Scoring of Sleep and Associated Events v2.3 (April 2016) with a hypopnea requiring 4% desaturations. The channels recorded and monitored were frontal, central and occipital EEG, electrooculogram (EOG), submentalis EMG (chin), nasal and oral airflow, thoracic and abdominal wall motion, anterior tibialis EMG, snore microphone, electrocardiogram, and pulse oximetry. Continuous positive airway pressure (CPAP) was initiated when the patient met split night criteria and was titrated according to treat sleep-disordered breathing.  MEDICATIONS Medications taken by the patient : Pacerone, COreg, Creon, Cosopt eye gtts, Lexapro, Proscar, Lasix, Neurontin, Losartan, Niaspan, KCL, Zarelto, Entresto, Spironolactone, Flomax Medications administered by patient during sleep study : No sleep medicine administered.  RESPIRATORY PARAMETERS Diagnostic Total AHI (/hr): 12.1  RDI (/hr):13.1   OA Index (/hr): 4.4  CA Index (/hr): 0.0 REM AHI (/hr): 18.5  NREM AHI (/hr):11.8  Supine AHI (/hr):11.9  Non-supine AHI (/hr):12.92 Min O2 Sat (%):73.00  Mean O2 (%):92.23  Time below 88% (min):14.7    Titration Optimal Pressure (cm):14  AHI at Optimal Pressure (/hr):0.0  Min O2 at Optimal Pressure (%):91.0 Supine % at Optimal (%):96  Sleep % at Optimal (%):81    SLEEP ARCHITECTURE The recording time for the entire night was 405.8 minutes. During a baseline period of 200.1 minutes, the patient slept for 179.0 minutes in REM and nonREM, yielding a sleep  efficiency of 89.5%. Sleep onset after lights out was 1.4 minutes with a REM latency of 149.0 minutes. The patient spent 3.63% of the night in stage N1 sleep, 92.74% in stage N2 sleep, 0.00% in stage N3 and 3.63% in REM.   During the titration period of 202.7 minutes, the patient slept for 196.4 minutes in REM and nonREM, yielding a sleep efficiency of 96.9%. Sleep onset after CPAP initiation was 0.0 minutes with a REM latency of 84.9 minutes. The patient spent 2.23% of the night in stage N1 sleep, 87.58% in stage N2 sleep, 0.00% in stage N3 and 10.18% in REM.  CARDIAC DATA The 2 lead EKG demonstrated sinus rhythm. The mean heart rate was 61.05 beats per minute. Other EKG findings include: PVCs.  LEG MOVEMENT DATA The total Periodic Limb Movements of Sleep (PLMS) were 1. The PLMS index was 0.16 .  IMPRESSIONS - Mild obstructive sleep apnea occurred during the diagnostic portion of the study (AHI = 12.1 /hour). An optimal PAP pressure was selected for this patient ( 14 cm of water) - No significant central sleep apnea occurred during the diagnostic portion of the study (CAI = 0.0/hour). - Mild oxygen desaturation was noted during the diagnostic portion of the study (Min O2 = 73.00%). - The patient snored with Loud snoring volume during the diagnostic portion of the study. - EKG findings include PVCs. - Clinically significant periodic limb movements did not occur during sleep. DIAGNOSIS - Obstructive Sleep Apnea (327.23 [G47.33 ICD-10])  RECOMMENDATIONS - Trial of CPAP therapy on 14 cm H2O with a Medium size Resmed Nasal Pillow Mask AirFit P10 mask and heated humidification. - Avoid alcohol, sedatives and other CNS depressants that may worsen sleep apnea and disrupt normal sleep architecture. - Sleep hygiene should be  reviewed to assess factors that may improve sleep quality. - Weight management and regular exercise should be initiated or continued. - Return to Sleep Center for re-evaluation  after 10weeks of therapy  [place], American Board of Sleep Medicine  ELECTRONICALLY SIGNED ON:  08/15/2015, 10:49 AM [place] PH: [phone]   FX: [phone] [place]

## 2015-08-15 NOTE — Telephone Encounter (Signed)
Please let patient know that they have significant sleep apnea and had successful CPAP titration and will be set up with CPAP unit.  Please let DME know that order is in EPIC.  Please set patient up for OV in 10 weeks 

## 2015-08-16 NOTE — Telephone Encounter (Signed)
Patient informed of results. Stated verbal understanding.   [place] Notified. Once he is set up with his machine I will schedule the 10 week follow-up.

## 2015-08-16 NOTE — Telephone Encounter (Signed)
Left message for patient to call back  

## 2015-08-17 ENCOUNTER — Ambulatory Visit (HOSPITAL_COMMUNITY)
Admission: RE | Admit: 2015-08-17 | Discharge: 2015-08-17 | Disposition: A | Discharge status: Home / Self Care | Payer: Medicare Other | Source: Ambulatory Visit | Attending: Internal Medicine | Admitting: Internal Medicine

## 2015-08-17 ENCOUNTER — Encounter (HOSPITAL_COMMUNITY)
Admission: RE | Disposition: A | Discharge status: Home / Self Care | Payer: Self-pay | Source: Ambulatory Visit | Attending: Internal Medicine

## 2015-08-17 ENCOUNTER — Encounter (HOSPITAL_COMMUNITY): Payer: Self-pay

## 2015-08-17 DIAGNOSIS — Z87891 Personal history of nicotine dependence: Secondary | ICD-10-CM | POA: Diagnosis not present

## 2015-08-17 DIAGNOSIS — D131 Benign neoplasm of stomach: Secondary | ICD-10-CM | POA: Diagnosis not present

## 2015-08-17 DIAGNOSIS — E785 Hyperlipidemia, unspecified: Secondary | ICD-10-CM | POA: Diagnosis not present

## 2015-08-17 DIAGNOSIS — I5022 Chronic systolic (congestive) heart failure: Secondary | ICD-10-CM | POA: Diagnosis not present

## 2015-08-17 DIAGNOSIS — Z85820 Personal history of malignant melanoma of skin: Secondary | ICD-10-CM | POA: Diagnosis not present

## 2015-08-17 DIAGNOSIS — I10 Essential (primary) hypertension: Secondary | ICD-10-CM | POA: Insufficient documentation

## 2015-08-17 DIAGNOSIS — Z79899 Other long term (current) drug therapy: Secondary | ICD-10-CM | POA: Diagnosis not present

## 2015-08-17 DIAGNOSIS — Z95810 Presence of automatic (implantable) cardiac defibrillator: Secondary | ICD-10-CM | POA: Insufficient documentation

## 2015-08-17 DIAGNOSIS — I480 Paroxysmal atrial fibrillation: Secondary | ICD-10-CM | POA: Insufficient documentation

## 2015-08-17 DIAGNOSIS — Z86010 Personal history of colonic polyps: Secondary | ICD-10-CM | POA: Insufficient documentation

## 2015-08-17 DIAGNOSIS — Z888 Allergy status to other drugs, medicaments and biological substances status: Secondary | ICD-10-CM | POA: Diagnosis not present

## 2015-08-17 DIAGNOSIS — K2281 Esophageal polyp: Secondary | ICD-10-CM

## 2015-08-17 DIAGNOSIS — R195 Other fecal abnormalities: Secondary | ICD-10-CM

## 2015-08-17 DIAGNOSIS — Z881 Allergy status to other antibiotic agents status: Secondary | ICD-10-CM | POA: Insufficient documentation

## 2015-08-17 DIAGNOSIS — I252 Old myocardial infarction: Secondary | ICD-10-CM | POA: Insufficient documentation

## 2015-08-17 DIAGNOSIS — K2950 Unspecified chronic gastritis without bleeding: Secondary | ICD-10-CM | POA: Diagnosis not present

## 2015-08-17 DIAGNOSIS — K228 Other specified diseases of esophagus: Secondary | ICD-10-CM

## 2015-08-17 HISTORY — PX: ENTEROSCOPY: SHX5533

## 2015-08-17 SURGERY — ENTEROSCOPY
Anesthesia: Moderate Sedation

## 2015-08-17 SURGERY — Surgical Case
Anesthesia: *Unknown

## 2015-08-17 MED ORDER — SODIUM CHLORIDE 0.9 % IV SOLN: INTRAVENOUS | Status: DC

## 2015-08-17 MED ORDER — MIDAZOLAM HCL 10 MG/2ML IJ SOLN
INTRAMUSCULAR | Status: DC | PRN
  Administered 2015-08-17 (×3): 2 mg via INTRAVENOUS
  Administered 2015-08-17: 1 mg via INTRAVENOUS
  Administered 2015-08-17: 2 mg via INTRAVENOUS

## 2015-08-17 MED ORDER — FENTANYL CITRATE (PF) 100 MCG/2ML IJ SOLN
INTRAMUSCULAR | Status: DC | PRN
Start: 2015-08-17 — End: 2015-08-17
  Administered 2015-08-17 (×3): 25 ug via INTRAVENOUS

## 2015-08-17 MED ORDER — RIVAROXABAN 20 MG PO TABS: 20.0000 mg | ORAL_TABLET | Freq: Every day | ORAL | Status: DC

## 2015-08-17 MED ORDER — BUTAMBEN-TETRACAINE-BENZOCAINE 2-2-14 % EX AERO
INHALATION_SPRAY | CUTANEOUS | Status: DC | PRN
  Administered 2015-08-17: 2 via TOPICAL

## 2015-08-17 MED ORDER — FENTANYL CITRATE (PF) 100 MCG/2ML IJ SOLN
INTRAMUSCULAR | Status: AC
  Filled 2015-08-17: qty 2

## 2015-08-17 MED ORDER — DIPHENHYDRAMINE HCL 50 MG/ML IJ SOLN
INTRAMUSCULAR | Status: AC
  Filled 2015-08-17: qty 1

## 2015-08-17 MED ORDER — MIDAZOLAM HCL 5 MG/ML IJ SOLN
INTRAMUSCULAR | Status: AC
  Filled 2015-08-17: qty 2

## 2015-08-17 NOTE — Discharge Instructions (Addendum)
° °  I did not find anything bad here and really did not find anything that would be leaking blood. I took stomach biopsies to look for gastritis - inflammation and removed a tiny polyp where the esophagus and stomach meet.  There were some xanthomas in the small intestine - little deposits of cholesterol in the lining. Not a problem.  I will let you know biopsy results and plans - could need to go back and look at polyp site where I removed large polyp in May. Please restart Xarelto tomorrow.  I appreciate the opportunity to care for you. Gatha Mayer, MD, FACG  YOU HAD AN ENDOSCOPIC PROCEDURE TODAY: Refer to the procedure report and other information in the discharge instructions given to you for any specific questions about what was found during the examination. If this information does not answer your questions, please call Dr. Celesta Aver office at [phone] to clarify.   YOU SHOULD EXPECT: Some feelings of bloating in the abdomen. Passage of more gas than usual. Walking can help get rid of the air that was put into your GI tract during the procedure and reduce the bloating. If you had a lower endoscopy (such as a colonoscopy or flexible sigmoidoscopy) you may notice spotting of blood in your stool or on the toilet paper. Some abdominal soreness may be present for a day or two, also.  DIET: Your first meal following the procedure should be a light meal and then it is ok to progress to your normal diet. A half-sandwich or bowl of soup is an example of a good first meal. Heavy or fried foods are harder to digest and may make you feel nauseous or bloated. Drink plenty of fluids but you should avoid alcoholic beverages for 24 hours.   ACTIVITY: Your care partner should take you home directly after the procedure. You should plan to take it easy, moving slowly for the rest of the day. You can resume normal activity the day after the procedure however YOU SHOULD NOT DRIVE, use power tools,  machinery or perform tasks that involve climbing or major physical exertion for 24 hours (because of the sedation medicines used during the test).   SYMPTOMS TO REPORT IMMEDIATELY: A gastroenterologist can be reached at any hour. Please call [phone]  for any of the following symptoms:   Following upper endoscopy (EGD, EUS, ERCP, esophageal dilation) Vomiting of blood or coffee ground material  New, significant abdominal pain  New, significant chest pain or pain under the shoulder blades  Painful or persistently difficult swallowing  New shortness of breath  Black, tarry-looking or red, bloody stools  FOLLOW UP:  If any biopsies were taken you will be contacted by phone or by letter within the next 1-3 weeks. Call [phone]  if you have not heard about the biopsies in 3 weeks.  Please also call with any specific questions about appointments or follow up tests.

## 2015-08-17 NOTE — Op Note (Signed)
[facility] [place], [address]   ENTEROSCOPY PROCEDURE REPORT     EXAM DATE: 08/17/2015  PATIENT NAME:      Chad Brown, Chad Brown           MR #:      [id number]  BIRTHDATE:       Dec 22, 1940      VISIT #:     [id number]  ATTENDING:     Gatha Mayer, MD, Marval Regal     STATUS:     outpatient  ASSISTANT:      Corliss Parish and Dortha Schwalbe   ASA CLASS:        Class III INDICATIONS:  The patient is a 74 yr old male here for an enteroscopy procedure due to hemoccult positive stools and iron deficiency anemia. PROCEDURE PERFORMED:     Small bowel enteroscopy with biopsy MEDICATIONS:     Fentanyl 75 mcg IV and Versed 9 mg IV CONSENT: The patient understands the risks and benefits of the procedure and understands that these risks include, but are not limited to: sedation, allergic reaction, infection, perforation and/or bleeding. Alternative means of evaluation and treatment include, among others: physical exam, x-rays, and/or surgical intervention. The patient elects to proceed with this endoscopic procedure. DESCRIPTION OF PROCEDURE: During intra-op preparation period all mechanical & medical equipment was checked for proper function. Hand hygiene and appropriate measures for infection prevention was taken. After the risks, benefits and alternatives of the procedure were thoroughly explained, Informed consent was verified, confirmed and timeout was successfully executed by the treatment team. The Pentax VSB-2900 endoscope was introduced through the mouth and advanced to the proximal jejunum jejunum. The prep was The overall prep quality was excellent.. The instrument was then slowly withdrawn while examining the mucosa circumferentially. The scope was then completely withdrawn from the patient and the procedure terminated. The pulse, BP, and O2 saturation were monitored and documented by the physician and the nursing staff throughout the  entire procedure.  The patient was cared for as planned according to standard protocol, then discharged to recovery in stable condition and with appropriate post procedure care. Estimated blood loss is zero unless otherwise noted in this procedure report.  1) Frond-like polyp removed with forceps from esophageal side of GE junction 2) ? 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   ADVERSE EVENTS:      There were no immediate complications.  IMPRESSIONS:     1) Frond-like polyp removed with forceps from esophageal side of GE junction 2) ? 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 Could need repeat colonoscopy to look at polypectomy site from May sooner than originally anticipated though if can hold Hgb with iron supplements might wait - will discuss with Dr. Synetta Fail, MD, [facility] eSigned:  Gatha Mayer, MD, [facility] 08/17/2015 4:38 PM   cc:  The Patient, Dr. Ancil Linsey and Dr. Barnet Glasgow

## 2015-08-17 NOTE — H&P (Signed)
[facility] History and Physical   Primary Care Physician:  Sherrie Mustache, MD   Reason for Procedure:   evaluate anemia, heme + stool  Plan:    Small bowel enteroscopy - The risks and benefits as well as alternatives of endoscopic procedure(s) have been discussed and reviewed. All questions answered. The patient agrees to proceed.      HPI: Chad Brown is a 74 y.o. male with a multifactorial anemia - low iron stores on testing earlier this year - and a heme + x 2 set of stool guaiacs. He takes Xarelto and has held it.   Past Medical History  Diagnosis Date  . Hyperlipidemia   . Hypertension   . Chronic systolic CHF (congestive heart failure) ([place])     a. New dx 12/2012 ? NICM, may be r/t afib. b. Nuc 03/2013 - normal;  c. 03/2015 TEE EF 15-20%.  [name] PAF (paroxysmal atrial fibrillation) ([place])     a. Dx 12/2012, s/p TEE/DCCV 01/26/13. b. On Xarelto (CHA2DS2VASc = 3);  c. 03/2015 TEE (EF 15-20%, no LAA thrombus) and DCCV - amio increased to 200 mg bid.  . Anemia     iron deficient  . Cholelithiasis   . Atherosclerosis   . Colon polyp, hyperplastic 5/16    removed precancerous lesions  . AICD (automatic cardioverter/defibrillator) present 03/08/2015    MDT CRTD   . Chronic systolic CHF (congestive heart failure) ([place])   . Myocardial infarction ([place]) 1998  . Arthritis     "about all my joints; hands, knees, back" (03/08/2015)  . Urinary hesitancy due to benign prostatic hypertrophy   . Melanoma of eye ([place]) 2000's    "right; it's never been biopsied"  . Melanoma of lower leg ([place]) 2015    "left; right at my knee"  . Adenomatous colon polyp     tubular  . Diverticulosis     Past Surgical History  Procedure Laterality Date  . Back surgery    . Total hip arthroplasty Right 06/2007  . Cataract extraction Right ~ 2006  . Glaucoma surgery Right ~ 2006    "put 3 stents in to drain fluid" (03/08/2015)  . Refractive surgery Right ~ 2006 X 2    "twice; both done  at [place]" (03/08/2015  . Surgery scrotal / testicular Right 1990's  . Incision and drainage abscess posterior cervicalspine  05/2012  . Cardiac catheterization  1998  . Tee without cardioversion N/A 01/26/2013    Procedure: TRANSESOPHAGEAL ECHOCARDIOGRAM (TEE);  Surgeon: Lelon Perla, MD;  Location: [facility];  Service: Cardiovascular;  Laterality: N/A;  [name] anes. /   . Cardioversion N/A 01/26/2013    Procedure: CARDIOVERSION;  Surgeon: Lelon Perla, MD;  Location: [facility] ENDOSCOPY;  Service: Cardiovascular;  Laterality: N/A;  . Tee without cardioversion N/A 10/05/2014    Procedure: TRANSESOPHAGEAL ECHOCARDIOGRAM (TEE)  with cardioversion;  Surgeon: Thayer Headings, MD;  Location: [facility] ENDOSCOPY;  Service: Cardiovascular;  Laterality: N/A;  12:52 synched cardioversion at 120 joules,...afib to SR...12 lead EKG ordered.Marland KitchenMarland KitchenCardiozem d/c'ed per MD verbal order at [place]  . Melanoma excision Left 2015    "lower leg; right at my knee"  . Colonoscopy with propofol N/A 02/10/2015    Procedure: COLONOSCOPY WITH PROPOFOL;  Surgeon: Gatha Mayer, MD;  Location: [facility] ENDOSCOPY;  Service: Endoscopy;  Laterality: N/A;  . Joint replacement    . Thoracic spine surgery  03/2000    "ground calcium deposits from upper thoracic" (01/26/2013)  . Ep implantable device  N/A 03/08/2015    MDT Hillery Aldo CRT-D for nonischemic CM by Dr Rayann Heman for primary prevention  . Cardioversion N/A 03/23/2015    Procedure: CARDIOVERSION;  Surgeon: Jerline Pain, MD;  Location: [facility];  Service: Cardiovascular;  Laterality: N/A;  . Tee without cardioversion N/A 03/23/2015    Procedure: TRANSESOPHAGEAL ECHOCARDIOGRAM (TEE);  Surgeon: Jerline Pain, MD;  Location: [facility] ENDOSCOPY;  Service: Cardiovascular;  Laterality: N/A;    Prior to Admission medications   Medication Sig Start Date End Date Taking? Authorizing Provider  amiodarone (PACERONE) 200 MG tablet Take 1 tablet (200 mg total) by mouth daily. 07/07/15  Yes Larey Dresser, MD   carvedilol (COREG) 12.5 MG tablet Take 12.5 mg by mouth 2 (two) times daily with a meal.   Yes Historical Provider, MD  CREON 36000 UNITS CPEP capsule Take 1 capsule by mouth 3  times daily before meals  and 1 capsule with each  snack 08/01/15  Yes Gatha Mayer, MD  dorzolamide-timolol (COSOPT) 22.3-6.8 MG/ML ophthalmic solution Place 1 drop into the right eye 2 (two) times daily.   Yes Historical Provider, MD  escitalopram (LEXAPRO) 20 MG tablet Take 20 mg by mouth daily.   Yes Historical Provider, MD  ezetimibe (ZETIA) 10 MG tablet Take 1 tablet (10 mg total) by mouth daily. 05/03/15  Yes Josue Hector, MD  finasteride (PROSCAR) 5 MG tablet Take 5 mg by mouth every morning.  02/13/13  Yes Historical Provider, MD  furosemide (LASIX) 20 MG tablet Take 2 tablets (40 mg total) by mouth daily. 06/20/15  Yes Larey Dresser, MD  gabapentin (NEURONTIN) 100 MG capsule Take 200-300 mg by mouth See admin instructions. Take 2 capsules by mouth in the morning and 3 capsules by mouth at night 02/20/12  Yes Historical Provider, MD  losartan (COZAAR) 50 MG tablet Take 50 mg by mouth daily. 50 mg in am and 25 mg in pm   Yes Historical Provider, MD  NIASPAN 500 MG CR tablet Take 1,000 mg by mouth at bedtime.  05/09/12  Yes Historical Provider, MD  Polyethyl Glycol-Propyl Glycol (SYSTANE ULTRA OP) Place 1 drop into the left eye 2 (two) times daily.    Yes Historical Provider, MD  potassium chloride SA (K-DUR,KLOR-CON) 20 MEQ tablet Take 20 mEq by mouth 2 (two) times daily.   Yes Historical Provider, MD  rivaroxaban (XARELTO) 20 MG TABS tablet Take 1 tablet (20 mg total) by mouth daily with supper. Restart tomorrow 02/11/2015 02/10/15  Yes Gatha Mayer, MD  spironolactone (ALDACTONE) 25 MG tablet Take 0.5 tablets (12.5 mg total) by mouth daily. 06/07/15  Yes Larey Dresser, MD  tamsulosin (FLOMAX) 0.4 MG CAPS Take 0.4 mg by mouth at bedtime.    Yes Historical Provider, MD  acetaminophen (TYLENOL) 500 MG tablet Take 1,000  mg by mouth every 4 (four) hours as needed for moderate pain or headache.    Historical Provider, MD  sacubitril-valsartan (ENTRESTO) 24-26 MG Take 1 tablet by mouth 2 (two) times daily. 07/07/15   Larey Dresser, MD  sacubitril-valsartan [facility]) 24-26 MG  07/11/15   Historical Provider, MD  XARELTO 20 MG TABS tablet Take 1 tablet by mouth  daily with supper 06/16/15   Josue Hector, MD    Current Facility-Administered Medications  Medication Dose Route Frequency Provider Last Rate Last Dose  . 0.9 %  sodium chloride infusion   Intravenous Continuous Gatha Mayer, MD  Allergies as of 08/10/2015 - Review Complete 08/05/2015  Allergen Reaction Noted  . Pravastatin sodium Other (See Comments) 03/21/2015  . Zithromax [azithromycin] Diarrhea 10/02/2014    Family History  Problem Relation Age of Onset  . Kidney disease Mother   . Heart disease Mother     MI, open heart  . Diabetes Mother     dialysis  . Leukemia Father   . Colon cancer Paternal Uncle   . Lung cancer Paternal Uncle     x 2  . Prostate cancer Paternal Uncle   . Diabetes Maternal Grandmother   . Heart attack Maternal Uncle   . Diabetes Maternal Aunt     x 3  . Diabetes Maternal Uncle     Social History   Social History  . Marital Status: Married    Spouse Name: N/A  . Number of Children: 3  . Years of Education: N/A   Occupational History  . retired    Social History Main Topics  . Smoking status: Former Smoker -- 3.00 packs/day for 48 years    Types: Cigarettes    Quit date: 09/28/2000  . Smokeless tobacco: Never Used  . Alcohol Use: No  . Drug Use: No  . Sexual Activity: Yes   Other Topics Concern  . Not on file   Social History Narrative   Pt lives in [place] with spouse.  3 children are grown and healthy.   Retired.  Ran a country store for 30 years, previously worked in the [employer] for 16 years    Review of Systems: Positive for eyeglasses, some dyspnea All  other review of systems negative except as mentioned in the HPI.  Physical Exam: Vital signs in last 24 hours: Temp:  [97.7 F (36.5 C)] 97.7 F (36.5 C) (11/16 1528) Pulse Rate:  [63] 63 (11/16 1528) Resp:  [16] 16 (11/16 1528) BP: (185)/(83) 185/83 mmHg (11/16 1528) SpO2:  [97 %] 97 % (11/16 1528)   General:   Alert,  Well-developed, well-nourished, pleasant and cooperative in NAD Lungs:  Clear throughout to auscultation.   Heart:  Regular rate and rhythm; no murmurs, clicks, rubs,  or gallops. Abdomen:  Soft, nontender and nondistended. Normal bowel sounds.   Neuro/Psych:  Alert and cooperative. Normal mood and affect. A and O x 3   @[place]  Simonne Maffucci, MD, [facility] [phone] (pager) 08/17/2015 3:39 PM@

## 2015-08-18 ENCOUNTER — Ambulatory Visit (INDEPENDENT_AMBULATORY_CARE_PROVIDER_SITE_OTHER): Payer: Medicare Other

## 2015-08-18 ENCOUNTER — Encounter (HOSPITAL_COMMUNITY): Payer: Self-pay | Admitting: Internal Medicine

## 2015-08-18 ENCOUNTER — Telehealth: Payer: Self-pay | Admitting: Cardiology

## 2015-08-18 DIAGNOSIS — Z95810 Presence of automatic (implantable) cardiac defibrillator: Secondary | ICD-10-CM

## 2015-08-18 DIAGNOSIS — I5023 Acute on chronic systolic (congestive) heart failure: Secondary | ICD-10-CM | POA: Diagnosis not present

## 2015-08-18 NOTE — Telephone Encounter (Signed)
LMOVM reminding pt to send remote transmission.   

## 2015-08-19 ENCOUNTER — Telehealth: Payer: Self-pay | Admitting: Internal Medicine

## 2015-08-19 NOTE — Telephone Encounter (Signed)
Spoke w/ pt and instructed him to move his home monitor closer to a window. Pt done this. He turned monitor on and he code 3230 popped up. I attempted to help pt trouble shoot monitor. After a unsuccessful attempt I instructed pt to call tech services. Pt verbalized understanding.

## 2015-08-19 NOTE — Telephone Encounter (Signed)
New Message  Pt calling to speak w/ Device on sending remote transmission. Please call back and discuss.

## 2015-08-23 ENCOUNTER — Telehealth: Payer: Self-pay

## 2015-08-23 NOTE — Telephone Encounter (Signed)
Received ICM transmission.  Attempted patient call and left message for return call.

## 2015-08-23 NOTE — Progress Notes (Signed)
EPIC Encounter for ICM Monitoring  Patient Name: Chad Brown is a 74 y.o. male Date: 08/23/2015 Primary Care Physican: Sherrie Mustache, MD Primary Cardiologist: Nishan/McLean (CHF clinic) Electrophysiologist: Allred Dry Weight: 200.6 lbs  Bi-V Pacing 98.2%       In the past month, have you:  1. Gained more than 2 pounds in a day or more than 5 pounds in a week? no  2. Had changes in your medications (with verification of current medications)? no  3. Had more shortness of breath than is usual for you? no  4. Limited your activity because of shortness of breath? no  5. Not been able to sleep because of shortness of breath? no  6. Had increased swelling in your feet or ankles? no  7. Had symptoms of dehydration (dizziness, dry mouth, increased thirst, decreased urine output) no  8. Had changes in sodium restriction? no  9. Been compliant with medication? Yes   ICM trend:  08/18/2015   Follow-up plan: ICM clinic phone appointment 09/27/2015.  Optivol thoracic impedance trending along baseline.  He denied any HF symptoms and is feeling the best he has felt for a long time.  No changes today.   Copy of note sent to patient's primary care physician, primary cardiologist, and device following physician.  Rosalene Billings, RN, CCM 08/23/2015 3:55 PM

## 2015-08-24 NOTE — Telephone Encounter (Signed)
Spoke with patient.

## 2015-08-28 NOTE — Progress Notes (Signed)
Quick Note:  Let him know bxs all ok - benign Am going to discuss things with Dr. Whitney Muse ______

## 2015-08-29 ENCOUNTER — Other Ambulatory Visit: Payer: Self-pay | Admitting: Cardiology

## 2015-08-30 ENCOUNTER — Other Ambulatory Visit (HOSPITAL_COMMUNITY): Payer: Self-pay | Admitting: Cardiology

## 2015-08-31 ENCOUNTER — Other Ambulatory Visit (HOSPITAL_COMMUNITY): Payer: Self-pay | Admitting: *Deleted

## 2015-08-31 MED ORDER — LOSARTAN POTASSIUM 50 MG PO TABS: ORAL_TABLET | ORAL | Status: DC

## 2015-08-31 NOTE — Telephone Encounter (Signed)
Refilled losartan and updated the instructions

## 2015-08-31 NOTE — Progress Notes (Signed)
Quick Note:  Let him know I communicated w/ Dr. Whitney Muse and we are not going to do any more GI tests I will see him as needed  No letter/recall from [place] ______

## 2015-09-09 ENCOUNTER — Ambulatory Visit: Payer: Medicare Other | Admitting: Internal Medicine

## 2015-09-14 ENCOUNTER — Other Ambulatory Visit (HOSPITAL_COMMUNITY): Payer: Self-pay | Admitting: Hematology & Oncology

## 2015-09-27 ENCOUNTER — Ambulatory Visit (INDEPENDENT_AMBULATORY_CARE_PROVIDER_SITE_OTHER): Payer: Medicare Other

## 2015-09-27 ENCOUNTER — Telehealth: Payer: Self-pay | Admitting: Cardiology

## 2015-09-27 DIAGNOSIS — I5022 Chronic systolic (congestive) heart failure: Secondary | ICD-10-CM | POA: Diagnosis not present

## 2015-09-27 DIAGNOSIS — Z95810 Presence of automatic (implantable) cardiac defibrillator: Secondary | ICD-10-CM

## 2015-09-27 NOTE — Telephone Encounter (Signed)
Spoke with pt and reminded pt of remote transmission that is due today. Pt verbalized understanding.   

## 2015-09-28 ENCOUNTER — Other Ambulatory Visit (HOSPITAL_COMMUNITY): Payer: Self-pay | Admitting: Cardiology

## 2015-09-29 NOTE — Progress Notes (Signed)
EPIC Encounter for ICM Monitoring  Patient Name: Chad Brown is a 74 y.o. male Date: 09/29/2015 Primary Care Physican: Sherrie Mustache, MD Primary Cardiologist: Nishan/McLean (CHF Clinic) Electrophysiologist: Allred Dry Weight: 204 lb       In the past month, have you:  1. Gained more than 2 pounds in a day or more than 5 pounds in a week? No, but has gained 5-7 pounds in last 4-6 weeks.   2. Had changes in your medications (with verification of current medications)? no  3. Had more shortness of breath than is usual for you? no  4. Limited your activity because of shortness of breath? no  5. Not been able to sleep because of shortness of breath? no  6. Had increased swelling in your feet or ankles? no  7. Had symptoms of dehydration (dizziness, dry mouth, increased thirst, decreased urine output) no  8. Had changes in sodium restriction? no  9. Been compliant with medication? Yes   ICM trend:   Follow-up plan: ICM clinic phone appointment on 10/31/2015.  Optivol impedance trending along baseline.  He denied any HF symptoms.  He stated his back is sore today due to a dump truck hitting his truck door which slammed into his back.  He denied hitting his head.  Advised to make an appointment with PCP for follow up.  No changes today.    Copy of note sent to patient's primary care physician, primary cardiologist, and device following physician.  Rosalene Billings, RN, CCM 09/29/2015 3:24 PM

## 2015-10-11 ENCOUNTER — Encounter: Payer: Self-pay | Admitting: Cardiology

## 2015-10-14 ENCOUNTER — Other Ambulatory Visit (HOSPITAL_COMMUNITY): Payer: Self-pay | Admitting: Hematology & Oncology

## 2015-10-18 ENCOUNTER — Other Ambulatory Visit: Payer: Self-pay | Admitting: *Deleted

## 2015-10-18 MED ORDER — CARVEDILOL 12.5 MG PO TABS: 12.5000 mg | ORAL_TABLET | Freq: Two times a day (BID) | ORAL | Status: DC

## 2015-10-19 ENCOUNTER — Encounter: Payer: Self-pay | Admitting: *Deleted

## 2015-10-21 ENCOUNTER — Encounter: Payer: Self-pay | Admitting: Cardiovascular Disease

## 2015-10-21 NOTE — Progress Notes (Signed)
Patient ID: Chad Brown, male   DOB: [birth year], 75 y.o.   MRN: [id number]    Electrophysiology Office Note Date: 10/24/2015  ID:  Chad Brown, DOB 01/06/1941, MRN [id number]  PCP: Sherrie Mustache, MD Primary Cardiologist: Johnsie Cancel Electrophysiologist: Allred  CC: Post hospitalization follow up for AF  Chad Brown is a 75 y.o. male  With PAF/DCM.  He was admitted 03/21/15 with worsening shortness of breath and a 7 pound weight gain and was found to be in AF with RVR.  His amiodarone was increased and he underwent cardioversion prior to discharge.  His ARB was held 2/2 acute on chronic renal insufficiency ARB restarted June 2016  by PA  Amiodarone decreased to 300mg  dayly labs ok  On xaretlo for anticoagulation   Device History: MDT CRTD implanted 2016 for NICM, CHF History of appropriate therapy: No History of AAD therapy: Yes - amiodarone for AF  03/23/15  EF 15-20%  By TEE   Past Medical History  Diagnosis Date  . Hyperlipidemia   . Hypertension   . Chronic systolic CHF (congestive heart failure) ([place])     a. New dx 12/2012 ? NICM, may be r/t afib. b. Nuc 03/2013 - normal;  c. 03/2015 TEE EF 15-20%.  Chad Brown PAF (paroxysmal atrial fibrillation) ([place])     a. Dx 12/2012, s/p TEE/DCCV 01/26/13. b. On Xarelto (CHA2DS2VASc = 3);  c. 03/2015 TEE (EF 15-20%, no LAA thrombus) and DCCV - amio increased to 200 mg bid.  . Anemia     iron deficient  . Cholelithiasis   . Atherosclerosis   . Colon polyp, hyperplastic 5/16    removed precancerous lesions  . AICD (automatic cardioverter/defibrillator) present 03/08/2015    MDT CRTD   . Chronic systolic CHF (congestive heart failure) ([place])   . Myocardial infarction ([place]) 1998  . Arthritis     "about all my joints; hands, knees, back" (03/08/2015)  . Urinary hesitancy due to benign prostatic hypertrophy   . Melanoma of eye ([place]) 2000's    "right; it's never been biopsied"  . Melanoma of lower leg ([place]) 2015    "left; right at my knee"    . Adenomatous colon polyp     tubular  . Diverticulosis    Past Surgical History  Procedure Laterality Date  . Back surgery    . Total hip arthroplasty Right 06/2007  . Cataract extraction Right ~ 2006  . Glaucoma surgery Right ~ 2006    "put 3 stents in to drain fluid" (03/08/2015)  . Refractive surgery Right ~ 2006 X 2    "twice; both done at [facility]" (03/08/2015  . Surgery scrotal / testicular Right 1990's  . Incision and drainage abscess posterior cervicalspine  05/2012  . Cardiac catheterization  1998  . Tee without cardioversion N/A 01/26/2013    Procedure: TRANSESOPHAGEAL ECHOCARDIOGRAM (TEE);  Surgeon: Lelon Perla, MD;  Location: [facility];  Service: Cardiovascular;  Laterality: N/A;  [name] anes. /   . Cardioversion N/A 01/26/2013    Procedure: CARDIOVERSION;  Surgeon: Lelon Perla, MD;  Location: [facility] ENDOSCOPY;  Service: Cardiovascular;  Laterality: N/A;  . Tee without cardioversion N/A 10/05/2014    Procedure: TRANSESOPHAGEAL ECHOCARDIOGRAM (TEE)  with cardioversion;  Surgeon: Thayer Headings, MD;  Location: [facility] ENDOSCOPY;  Service: Cardiovascular;  Laterality: N/A;  12:52 synched cardioversion at 120 joules,...afib to SR...12 lead EKG ordered.Chad KitchenMarland KitchenCardiozem d/c'ed per MD verbal order at [facility]  . Melanoma excision Left 2015    "lower leg; right  at my knee"  . Colonoscopy with propofol N/A 02/10/2015    Procedure: COLONOSCOPY WITH PROPOFOL;  Surgeon: Gatha Mayer, MD;  Location: [facility] ENDOSCOPY;  Service: Endoscopy;  Laterality: N/A;  . Joint replacement    . Thoracic spine surgery  03/2000    "ground calcium deposits from upper thoracic" (01/26/2013)  . Ep implantable device N/A 03/08/2015    MDT Hillery Aldo CRT-D for nonischemic CM by Dr Rayann Heman for primary prevention  . Cardioversion N/A 03/23/2015    Procedure: CARDIOVERSION;  Surgeon: Jerline Pain, MD;  Location: [facility];  Service: Cardiovascular;  Laterality: N/A;  . Tee without cardioversion N/A 03/23/2015    Procedure:  TRANSESOPHAGEAL ECHOCARDIOGRAM (TEE);  Surgeon: Jerline Pain, MD;  Location: [facility];  Service: Cardiovascular;  Laterality: N/A;  . Enteroscopy N/A 08/17/2015    Procedure: ENTEROSCOPY;  Surgeon: Gatha Mayer, MD;  Location: [facility] ENDOSCOPY;  Service: Endoscopy;  Laterality: N/A;    Current Outpatient Prescriptions  Medication Sig Dispense Refill  . acetaminophen (TYLENOL) 500 MG tablet Take 1,000 mg by mouth every 4 (four) hours as needed for moderate pain or headache.    Chad Brown amiodarone (PACERONE) 200 MG tablet Take 1 tablet (200 mg total) by mouth daily. 30 tablet 11  . carvedilol (COREG) 12.5 MG tablet Take 1 tablet (12.5 mg total) by mouth 2 (two) times daily with a meal. 60 tablet 0  . dorzolamide-timolol (COSOPT) 22.3-6.8 MG/ML ophthalmic solution Place 1 drop into the right eye 2 (two) times daily.    Chad Brown escitalopram (LEXAPRO) 20 MG tablet Take 20 mg by mouth daily.    Chad Brown ezetimibe (ZETIA) 10 MG tablet Take 1 tablet (10 mg total) by mouth daily. 90 tablet 3  . finasteride (PROSCAR) 5 MG tablet Take 5 mg by mouth every morning.     . furosemide (LASIX) 20 MG tablet Take 2 tablets (40 mg total) by mouth daily. 180 tablet 1  . gabapentin (NEURONTIN) 100 MG capsule Take 200-300 mg by mouth as directed.    . lipase/protease/amylase (CREON) 36000 UNITS CPEP capsule Take 36,000 Units by mouth as directed.    Chad Brown losartan (COZAAR) 50 MG tablet Take 25-50 mg by mouth daily.    Chad Brown NIASPAN 500 MG CR tablet Take 1,000 mg by mouth at bedtime.     Chad Brown-Propyl Brown (SYSTANE ULTRA OP) Place 1 drop into the left eye 2 (two) times daily.     . potassium chloride SA (K-DUR,KLOR-CON) 20 MEQ tablet Take 20 mEq by mouth 2 (two) times daily.    . rivaroxaban (XARELTO) 20 MG TABS tablet Take 1 tablet (20 mg total) by mouth daily with supper. Restart tomorrow 08/18/2015 90 tablet 2  . spironolactone (ALDACTONE) 25 MG tablet Take 12.5 mg by mouth daily.    . tamsulosin (FLOMAX) 0.4 MG CAPS Take  0.4 mg by mouth at bedtime.      No current facility-administered medications for this visit.    Allergies:   Pravastatin sodium and Zithromax   Social History: Social History   Social History  . Marital Status: Married    Spouse Name: N/A  . Number of Children: 3  . Years of Education: N/A   Occupational History  . retired    Social History Main Topics  . Smoking status: Former Smoker -- 3.00 packs/day for 48 years    Types: Cigarettes    Quit date: 09/28/2000  . Smokeless tobacco: Never Used  . Alcohol Use: No  .  Drug Use: No  . Sexual Activity: Yes   Other Topics Concern  . Not on file   Social History Narrative   Pt lives in [place] with spouse.  3 children are grown and healthy.   Retired.  Ran a country store for 30 years, previously worked in the [employer] for 16 years    Family History: Family History  Problem Relation Age of Onset  . Kidney disease Mother   . Heart disease Mother     MI, open heart  . Diabetes Mother     dialysis  . Leukemia Father   . Colon cancer Paternal Uncle   . Lung cancer Paternal Uncle     x 2  . Prostate cancer Paternal Uncle   . Diabetes Maternal Grandmother   . Heart attack Maternal Uncle   . Diabetes Maternal Aunt     x 3  . Diabetes Maternal Uncle     Review of Systems: All other systems reviewed and are otherwise negative except as noted above.   Physical Exam: VS:  BP 138/80 mmHg  Pulse 74  Ht 5' 6.5" (1.689 m)  Wt 96.163 kg (212 lb)  BMI 33.71 kg/m2  SpO2 92% , BMI Body mass index is 33.71 kg/(m^2).  GEN- The patient is well appearing, alert and oriented x 3 today.   HEENT: normocephalic, atraumatic; sclera clear, conjunctiva pink; hearing intact; oropharynx clear; neck supple  Lungs- Clear to ausculation bilaterally, normal work of breathing.  No wheezes, rales, rhonchi Heart- Regular rate and rhythm (paced) GI- soft, non-tender, non-distended, bowel sounds present  Extremities- no  clubbing, cyanosis, or edema; DP/PT/radial pulses 2+ bilaterally MS- no significant deformity or atrophy Skin- warm and dry, no rash or lesion; ICD pocket well healed Psych- euthymic mood, full affect Neuro- strength and sensation are intact  ICD interrogation- reviewed in detail today,  See PACEART report  EKG:  EKG  03/22/15  SR rate 91 LBBB PVC;s   Recent Labs: 10/29/2014: Pro B Natriuretic peptide (BNP) 385.0* 06/07/2015: TSH 1.124 07/07/2015: ALT 47; BUN 11; Creatinine, Ser 1.05; Potassium 4.4; Sodium 140 07/21/2015: B Natriuretic Peptide 215.0*; Hemoglobin 12.0*; Platelets 110*   Wt Readings from Last 3 Encounters:  10/24/15 96.163 kg (212 lb)  08/14/15 89.359 kg (197 lb)  07/21/15 92.987 kg (205 lb)    Lab Results  Component Value Date   CREATININE 1.05 07/07/2015   BUN 11 07/07/2015   NA 140 07/07/2015   K 4.4 07/07/2015   CL 103 07/07/2015   CO2 31 07/07/2015     Other studies Reviewed: Additional studies/ records that were reviewed today include: Dr Jackalyn Lombard office notes, hospital records  Assessment and Plan:  1.  Persistent atrial fibrillation He was admitted June 2016  with acute on chronic heart failure in the setting of AF with RVR.  He underwent TEE/DCCV and reports significant symptomatic improvement in symptoms.  Continue Xarelto for CHADS2VASC of 4 Continue amoidarone 300 mg daily   2.  Chronic systolic dysfunction euvolemic today Back on  Losartan ? Entresto stopped by Dr Aundra Dubin for low BP  F/U ICM clinic aldactone added see labs above  3.  HTN Well controlled.  Continue current medications and low sodium Dash type diet.     4.  Neuropathic pain He has been maintained on Neurontin as prescribed by Dr Edrick Oh with resolution of pain.  He asks today about discontinuing this medication.  Advised to follow up with PCP regarding recommendations   Current  medicines are reviewed at length with the patient today.       Disposition:   Follow up with me  in 6 months  FU with CHF clinic 3 months  Echo prior to CHF appt    [name]

## 2015-10-24 ENCOUNTER — Ambulatory Visit (INDEPENDENT_AMBULATORY_CARE_PROVIDER_SITE_OTHER): Payer: Medicare Other | Admitting: Cardiovascular Disease

## 2015-10-24 ENCOUNTER — Encounter: Payer: Self-pay | Admitting: Cardiovascular Disease

## 2015-10-24 VITALS — BP 138/80 | HR 74 | Ht 66.5 in | Wt 212.0 lb

## 2015-10-24 DIAGNOSIS — I10 Essential (primary) hypertension: Secondary | ICD-10-CM

## 2015-10-24 DIAGNOSIS — I5022 Chronic systolic (congestive) heart failure: Secondary | ICD-10-CM | POA: Diagnosis not present

## 2015-10-24 MED ORDER — RIVAROXABAN 20 MG PO TABS: 20.0000 mg | ORAL_TABLET | Freq: Every day | ORAL | Status: DC

## 2015-10-24 NOTE — Patient Instructions (Addendum)
Medication Instructions:  Your physician recommends that you continue on your current medications as directed. Please refer to the Current Medication list given to you today.  Labwork: NONE  Testing/Procedures: Your physician has requested that you have an echocardiogram in February. Echocardiography is a painless test that uses sound waves to create images of your heart. It provides your doctor with information about the size and shape of your heart and how well your heart's chambers and valves are working. This procedure takes approximately one hour. There are no restrictions for this procedure.  Follow-Up: Your physician recommends that you schedule a follow-up appointment in: 3 months with Dr. Johnsie Cancel.  Your physician recommends that you schedule a follow-up appointment in: March with [facility] Failure [facility].   If you need a refill on your cardiac medications before your next appointment, please call your pharmacy.

## 2015-10-31 ENCOUNTER — Other Ambulatory Visit (HOSPITAL_COMMUNITY): Payer: Self-pay | Admitting: *Deleted

## 2015-10-31 ENCOUNTER — Telehealth: Payer: Self-pay | Admitting: Internal Medicine

## 2015-10-31 ENCOUNTER — Ambulatory Visit (INDEPENDENT_AMBULATORY_CARE_PROVIDER_SITE_OTHER): Payer: Medicare Other

## 2015-10-31 ENCOUNTER — Other Ambulatory Visit: Payer: Self-pay | Admitting: Cardiology

## 2015-10-31 ENCOUNTER — Telehealth: Payer: Self-pay | Admitting: Cardiology

## 2015-10-31 DIAGNOSIS — D500 Iron deficiency anemia secondary to blood loss (chronic): Secondary | ICD-10-CM

## 2015-10-31 DIAGNOSIS — Z95810 Presence of automatic (implantable) cardiac defibrillator: Secondary | ICD-10-CM | POA: Diagnosis not present

## 2015-10-31 DIAGNOSIS — I5022 Chronic systolic (congestive) heart failure: Secondary | ICD-10-CM

## 2015-10-31 NOTE — Telephone Encounter (Signed)
Confirmed remote transmission w/ pt wife.   

## 2015-10-31 NOTE — Telephone Encounter (Signed)
Per pt call want to know his transmission from his divice was received today.   Please give him a call back.

## 2015-10-31 NOTE — Telephone Encounter (Signed)
Informed pt that we had not received his remote transmission. He is going to send a manual transmission.

## 2015-11-01 ENCOUNTER — Telehealth (HOSPITAL_COMMUNITY): Payer: Self-pay | Admitting: *Deleted

## 2015-11-01 NOTE — Telephone Encounter (Signed)
Pt needs iron studies done.  Contacted pt and notified to keep lab appt on 11/04/15.

## 2015-11-01 NOTE — Progress Notes (Signed)
EPIC Encounter for ICM Monitoring  Patient Name: Chad Brown is a 75 y.o. male Date: 11/01/2015 Primary Care Physican: Sherrie Mustache, MD Primary Cardiologist: Nishan/McLean Electrophysiologist: Allred Dry Weight: 207 lbs  Bi-V Pacing 98%       In the past month, have you:  1. Gained more than 2 pounds in a day or more than 5 pounds in a week? no  2. Had changes in your medications (with verification of current medications)? no  3. Had more shortness of breath than is usual for you? no  4. Limited your activity because of shortness of breath? no  5. Not been able to sleep because of shortness of breath? no  6. Had increased swelling in your feet or ankles? no  7. Had symptoms of dehydration (dizziness, dry mouth, increased thirst, decreased urine output) no  8. Had changes in sodium restriction? no  9. Been compliant with medication? Yes   ICM trend: 3 month view for 10/31/2015   ICM trend: 1 year view for 10/31/2015   Follow-up plan: ICM clinic phone appointment 12/02/2015.  Optivol thoacic impedance trending along reference line suggesting no fluid retention.  He stated he is doing well and going to the gym 3 days a week.  He had an appointment with Dr Johnsie Cancel on 10/24/2015 and no changes made and appointment with PCP on 10/31/2015 with labs.  He stated MD told him labs looked good except the cholesterol is a little higher.  He denied any fluid retention symptoms.  Encouraged to call should he experience any fluid retention symptoms.  No changes today.    Copy of note sent to patient's primary care physician, primary cardiologist, and device following physician.  Rosalene Billings, RN, CCM 11/01/2015 8:59 AM

## 2015-11-04 ENCOUNTER — Other Ambulatory Visit (HOSPITAL_COMMUNITY): Payer: Medicare Other

## 2015-11-04 ENCOUNTER — Encounter (HOSPITAL_COMMUNITY): Payer: Medicare Other | Attending: Hematology & Oncology

## 2015-11-04 DIAGNOSIS — C439 Malignant melanoma of skin, unspecified: Secondary | ICD-10-CM | POA: Insufficient documentation

## 2015-11-04 DIAGNOSIS — D649 Anemia, unspecified: Secondary | ICD-10-CM | POA: Insufficient documentation

## 2015-11-04 DIAGNOSIS — D500 Iron deficiency anemia secondary to blood loss (chronic): Secondary | ICD-10-CM

## 2015-11-04 LAB — CBC WITH DIFFERENTIAL/PLATELET
BASOS PCT: 1 %
Basophils Absolute: 0 10*3/uL (ref 0.0–0.1)
EOS ABS: 0.1 10*3/uL (ref 0.0–0.7)
EOS PCT: 2 %
HCT: 38.3 % — ABNORMAL LOW (ref 39.0–52.0)
HEMOGLOBIN: 12.6 g/dL — AB (ref 13.0–17.0)
Lymphocytes Relative: 20 %
Lymphs Abs: 1.1 10*3/uL (ref 0.7–4.0)
MCH: 31 pg (ref 26.0–34.0)
MCHC: 32.9 g/dL (ref 30.0–36.0)
MCV: 94.3 fL (ref 78.0–100.0)
MONOS PCT: 10 %
Monocytes Absolute: 0.5 10*3/uL (ref 0.1–1.0)
NEUTROS ABS: 3.6 10*3/uL (ref 1.7–7.7)
Neutrophils Relative %: 67 %
PLATELETS: 129 10*3/uL — AB (ref 150–400)
RBC: 4.06 MIL/uL — ABNORMAL LOW (ref 4.22–5.81)
RDW: 14.1 % (ref 11.5–15.5)
WBC: 5.2 10*3/uL (ref 4.0–10.5)

## 2015-11-04 LAB — IRON AND TIBC
Iron: 119 ug/dL (ref 45–182)
SATURATION RATIOS: 40 % — AB (ref 17.9–39.5)
TIBC: 294 ug/dL (ref 250–450)
UIBC: 175 ug/dL

## 2015-11-04 LAB — COMPREHENSIVE METABOLIC PANEL
ALBUMIN: 3.8 g/dL (ref 3.5–5.0)
ALK PHOS: 91 U/L (ref 38–126)
ALT: 34 U/L (ref 17–63)
ANION GAP: 7 (ref 5–15)
AST: 27 U/L (ref 15–41)
BUN: 14 mg/dL (ref 6–20)
CHLORIDE: 103 mmol/L (ref 101–111)
CO2: 32 mmol/L (ref 22–32)
Calcium: 8.7 mg/dL — ABNORMAL LOW (ref 8.9–10.3)
Creatinine, Ser: 1.23 mg/dL (ref 0.61–1.24)
GFR calc non Af Amer: 56 mL/min — ABNORMAL LOW (ref >60)
GLUCOSE: 105 mg/dL — AB (ref 65–99)
POTASSIUM: 4 mmol/L (ref 3.5–5.1)
SODIUM: 142 mmol/L (ref 135–145)
Total Bilirubin: 0.6 mg/dL (ref 0.3–1.2)
Total Protein: 7 g/dL (ref 6.5–8.1)

## 2015-11-04 LAB — FERRITIN: FERRITIN: 86 ng/mL (ref 24–336)

## 2015-11-07 ENCOUNTER — Other Ambulatory Visit: Payer: Self-pay

## 2015-11-07 ENCOUNTER — Ambulatory Visit (HOSPITAL_COMMUNITY): Payer: Medicare Other | Attending: Cardiovascular Disease

## 2015-11-07 DIAGNOSIS — I340 Nonrheumatic mitral (valve) insufficiency: Secondary | ICD-10-CM | POA: Diagnosis not present

## 2015-11-07 DIAGNOSIS — I517 Cardiomegaly: Secondary | ICD-10-CM | POA: Diagnosis not present

## 2015-11-07 DIAGNOSIS — I509 Heart failure, unspecified: Secondary | ICD-10-CM | POA: Diagnosis present

## 2015-11-07 DIAGNOSIS — Z6833 Body mass index (BMI) 33.0-33.9, adult: Secondary | ICD-10-CM | POA: Insufficient documentation

## 2015-11-07 DIAGNOSIS — Z87891 Personal history of nicotine dependence: Secondary | ICD-10-CM | POA: Diagnosis not present

## 2015-11-07 DIAGNOSIS — E669 Obesity, unspecified: Secondary | ICD-10-CM | POA: Diagnosis not present

## 2015-11-07 DIAGNOSIS — I351 Nonrheumatic aortic (valve) insufficiency: Secondary | ICD-10-CM | POA: Insufficient documentation

## 2015-11-07 DIAGNOSIS — I5022 Chronic systolic (congestive) heart failure: Secondary | ICD-10-CM | POA: Diagnosis not present

## 2015-11-07 DIAGNOSIS — I10 Essential (primary) hypertension: Secondary | ICD-10-CM | POA: Insufficient documentation

## 2015-11-07 DIAGNOSIS — E785 Hyperlipidemia, unspecified: Secondary | ICD-10-CM | POA: Diagnosis not present

## 2015-11-10 ENCOUNTER — Other Ambulatory Visit: Payer: Self-pay | Admitting: *Deleted

## 2015-11-10 DIAGNOSIS — D649 Anemia, unspecified: Secondary | ICD-10-CM

## 2015-11-10 DIAGNOSIS — I4819 Other persistent atrial fibrillation: Secondary | ICD-10-CM

## 2015-11-10 DIAGNOSIS — C439 Malignant melanoma of skin, unspecified: Secondary | ICD-10-CM

## 2015-11-10 MED ORDER — AMIODARONE HCL 200 MG PO TABS: 200.0000 mg | ORAL_TABLET | Freq: Every day | ORAL | Status: DC

## 2015-11-11 ENCOUNTER — Other Ambulatory Visit: Payer: Self-pay | Admitting: *Deleted

## 2015-11-11 ENCOUNTER — Other Ambulatory Visit: Payer: Self-pay | Admitting: Cardiovascular Disease

## 2015-11-11 MED ORDER — CARVEDILOL 12.5 MG PO TABS: 12.5000 mg | ORAL_TABLET | Freq: Two times a day (BID) | ORAL | Status: DC

## 2015-11-14 MED ORDER — FUROSEMIDE 20 MG PO TABS: 40.0000 mg | ORAL_TABLET | Freq: Every day | ORAL | Status: DC

## 2015-11-15 ENCOUNTER — Telehealth: Payer: Self-pay

## 2015-11-15 NOTE — Telephone Encounter (Signed)
Prior auth for Xarelto 20mg sent to [facility]. 

## 2015-11-16 ENCOUNTER — Telehealth: Payer: Self-pay

## 2015-11-16 ENCOUNTER — Other Ambulatory Visit (HOSPITAL_COMMUNITY): Payer: Self-pay | Admitting: Hematology & Oncology

## 2015-11-16 NOTE — Telephone Encounter (Signed)
xarelto approved through 09/30/2016. PA- [id number].

## 2015-11-25 ENCOUNTER — Other Ambulatory Visit (HOSPITAL_COMMUNITY): Payer: Self-pay | Admitting: *Deleted

## 2015-11-25 MED ORDER — SPIRONOLACTONE 25 MG PO TABS: 12.5000 mg | ORAL_TABLET | Freq: Every day | ORAL | Status: DC

## 2015-11-25 MED ORDER — POTASSIUM CHLORIDE CRYS ER 20 MEQ PO TBCR: 20.0000 meq | EXTENDED_RELEASE_TABLET | Freq: Two times a day (BID) | ORAL | Status: DC

## 2015-11-25 MED ORDER — LOSARTAN POTASSIUM 50 MG PO TABS
ORAL_TABLET | ORAL | Status: DC
Start: 2015-11-25 — End: 2016-01-24

## 2015-11-28 ENCOUNTER — Other Ambulatory Visit: Payer: Self-pay | Admitting: *Deleted

## 2015-11-28 MED ORDER — EZETIMIBE 10 MG PO TABS: 10.0000 mg | ORAL_TABLET | Freq: Every day | ORAL | Status: DC

## 2015-12-02 ENCOUNTER — Telehealth: Payer: Self-pay | Admitting: Internal Medicine

## 2015-12-02 ENCOUNTER — Ambulatory Visit (INDEPENDENT_AMBULATORY_CARE_PROVIDER_SITE_OTHER): Payer: Medicare Other

## 2015-12-02 DIAGNOSIS — Z95810 Presence of automatic (implantable) cardiac defibrillator: Secondary | ICD-10-CM

## 2015-12-02 DIAGNOSIS — I5022 Chronic systolic (congestive) heart failure: Secondary | ICD-10-CM | POA: Diagnosis not present

## 2015-12-02 NOTE — Progress Notes (Signed)
EPIC Encounter for ICM Monitoring  Patient Name: Chad Brown is a 75 y.o. male Date: 12/02/2015 Primary Care Physican: Sherrie Mustache, MD Primary Cardiologist: Johnsie Cancel Electrophysiologist: Allred Dry Weight: 207 lbs  Bi-V Pacing 98.1%       In the past month, have you:  1. Gained more than 2 pounds in a day or more than 5 pounds in a week? no  2. Had changes in your medications (with verification of current medications)? no  3. Had more shortness of breath than is usual for you? no  4. Limited your activity because of shortness of breath? no  5. Not been able to sleep because of shortness of breath? no  6. Had increased swelling in your feet or ankles? no  7. Had symptoms of dehydration (dizziness, dry mouth, increased thirst, decreased urine output) no  8. Had changes in sodium restriction? no  9. Been compliant with medication? Yes   ICM trend: 3 month view    ICM trend: 1 year view   Follow-up plan: ICM clinic phone appointment on 01/03/2016.  Optivol thoracic impedance trending along reference line suggesting stable fluid levels.  He stated he is doing well and continues gym workout 3 days a week.  He denied any fluid symptoms and encouraged to call if he any develops.  No changes today.    Copy of note sent to patient's primary care physician, primary cardiologist, and device following physician.  Rosalene Billings, RN, CCM 12/02/2015 1:44 PM

## 2015-12-02 NOTE — Telephone Encounter (Signed)
He wants to know if you received his transmission?

## 2015-12-02 NOTE — Telephone Encounter (Signed)
Spoke with patient.

## 2015-12-10 ENCOUNTER — Other Ambulatory Visit (HOSPITAL_COMMUNITY): Payer: Self-pay | Admitting: Hematology & Oncology

## 2015-12-19 ENCOUNTER — Encounter: Payer: Medicare Other | Admitting: Nurse Practitioner

## 2016-01-03 ENCOUNTER — Ambulatory Visit (INDEPENDENT_AMBULATORY_CARE_PROVIDER_SITE_OTHER): Payer: Medicare Other

## 2016-01-03 ENCOUNTER — Telehealth: Payer: Self-pay

## 2016-01-03 DIAGNOSIS — I5022 Chronic systolic (congestive) heart failure: Secondary | ICD-10-CM | POA: Diagnosis not present

## 2016-01-03 DIAGNOSIS — Z95810 Presence of automatic (implantable) cardiac defibrillator: Secondary | ICD-10-CM

## 2016-01-03 NOTE — Progress Notes (Signed)
EPIC Encounter for ICM Monitoring  Patient Name: Chad Brown is a 75 y.o. male Date: 01/03/2016 Primary Care Physican: Sherrie Mustache, MD Primary Cardiologist: Johnsie Cancel Electrophysiologist: Allred Dry Weight: 212 lb   Bi-V Pacing 98.1%      In the past month, have you:  1. Gained more than 2 pounds in a day or more than 5 pounds in a week? no  2. Had changes in your medications (with verification of current medications)? no  3. Had more shortness of breath than is usual for you? no  4. Limited your activity because of shortness of breath? no  5. Not been able to sleep because of shortness of breath? no  6. Had increased swelling in your feet or ankles? no  7. Had symptoms of dehydration (dizziness, dry mouth, increased thirst, decreased urine output) no  8. Had changes in sodium restriction? no  9. Been compliant with medication? Yes   ICM trend: 3 month view for 01/03/2016   ICM trend: 1 year view for 01/03/2016   Follow-up plan: ICM clinic phone appointment on 02/07/2016.  Thoracic impedance trending along reference line.  Patient denied fluid symptoms.   Encouraged to call for any fluid symptoms.  No changes today.   He reported some dizziness when bending over.  Advised to speak with Dr Johnsie Cancel at visit on 01/24/2016.   He is wearing CPAP nightly.   Rosalene Billings, RN, CCM 01/03/2016 3:05 PM

## 2016-01-03 NOTE — Telephone Encounter (Signed)
Received call from patient and requested a call back.  He wanted to check if the transmission was received.   Call to patient and stated the ICM transmission was not received and asked if he could send a manual today.  Patients monitor has not been sending automatic transmissions.

## 2016-01-03 NOTE — Telephone Encounter (Signed)
Remote ICM transmission received.  Attempted patient call and left message for return call.   

## 2016-01-04 ENCOUNTER — Encounter: Payer: Self-pay | Admitting: Cardiology

## 2016-01-04 ENCOUNTER — Ambulatory Visit (INDEPENDENT_AMBULATORY_CARE_PROVIDER_SITE_OTHER): Payer: Medicare Other | Admitting: Cardiology

## 2016-01-04 VITALS — BP 132/88 | HR 72 | Ht 66.0 in | Wt 214.4 lb

## 2016-01-04 DIAGNOSIS — E669 Obesity, unspecified: Secondary | ICD-10-CM | POA: Diagnosis not present

## 2016-01-04 DIAGNOSIS — I10 Essential (primary) hypertension: Secondary | ICD-10-CM | POA: Diagnosis not present

## 2016-01-04 DIAGNOSIS — G4733 Obstructive sleep apnea (adult) (pediatric): Secondary | ICD-10-CM | POA: Diagnosis not present

## 2016-01-04 HISTORY — DX: Obstructive sleep apnea (adult) (pediatric): G47.33

## 2016-01-04 MED ORDER — SALINE SPRAY 0.65 % NA SOLN: 2.0000 | Freq: Two times a day (BID) | NASAL | Status: DC

## 2016-01-04 NOTE — Patient Instructions (Signed)
Medication Instructions:  Your physician has recommended you make the following change in your medication:  1) START OTC Nasal Saline Spray 2 squirts twice daily  Labwork: None  Testing/Procedures: None  Follow-Up: Your physician wants you to follow-up in: 1 year with Dr. Radford Pax. You will receive a reminder letter in the mail two months in advance. If you don't receive a letter, please call our office to schedule the follow-up appointment.   Any Other Special Instructions Will Be Listed Below (If Applicable). Dr. Radford Pax has ordered a chin strap for you to wear with your PAP mask.     If you need a refill on your cardiac medications before your next appointment, please call your pharmacy.

## 2016-01-04 NOTE — Progress Notes (Signed)
Cardiology Office Note    Date:  01/04/2016   ID:  Chad Brown, DOB [birth year], MRN [id number]  PCP:  Sherrie Mustache, MD  Cardiologist:  Sueanne Margarita, MD   Chief Complaint  Patient presents with  . Sleep Apnea  . Hypertension    History of Present Illness:  Chad Brown is a 75 y.o. male with a history of HTN, dyslipidemia, CHF and PAF who presents for evaluation of OSA.  He underwent split night sleep study 08/2015 and was found to have mild OSA with an AHI of 12/hr and oxygen desaturations as low as 73%.  He had loud snoring.  He underwent CPAP titration to 14cm H2O and is now here for followup.  He is doing well with his CPAP therapy.  He uses a nasal pillow mask which he tolerates well and feels the pressure is adequate. He complains of very dry mouth in the am and some blood tinged nasal drainage.   Since going on CPAP he feels more rested in the am and has less daytime sleepiness.  He still snores but it is better.      Past Medical History  Diagnosis Date  . Hyperlipidemia   . Hypertension   . Chronic systolic CHF (congestive heart failure) ([place])     a. New dx 12/2012 ? NICM, may be r/t afib. b. Nuc 03/2013 - normal;  c. 03/2015 TEE EF 15-20%.  [name] PAF (paroxysmal atrial fibrillation) ([place])     a. Dx 12/2012, s/p TEE/DCCV 01/26/13. b. On Xarelto (CHA2DS2VASc = 3);  c. 03/2015 TEE (EF 15-20%, no LAA thrombus) and DCCV - amio increased to 200 mg bid.  . Anemia     iron deficient  . Cholelithiasis   . Atherosclerosis   . Colon polyp, hyperplastic 5/16    removed precancerous lesions  . AICD (automatic cardioverter/defibrillator) present 03/08/2015    MDT CRTD   . Chronic systolic CHF (congestive heart failure) ([place])   . Myocardial infarction ([place]) 1998  . Arthritis     "about all my joints; hands, knees, back" (03/08/2015)  . Urinary hesitancy due to benign prostatic hypertrophy   . Melanoma of eye ([place]) 2000's    "right; it's never been biopsied"  . Melanoma of  lower leg ([place]) 2015    "left; right at my knee"  . Adenomatous colon polyp     tubular  . Diverticulosis   . OSA (obstructive sleep apnea) 01/04/2016    Past Surgical History  Procedure Laterality Date  . Back surgery    . Total hip arthroplasty Right 06/2007  . Cataract extraction Right ~ 2006  . Glaucoma surgery Right ~ 2006    "put 3 stents in to drain fluid" (03/08/2015)  . Refractive surgery Right ~ 2006 X 2    "twice; both done at [facility]" (03/08/2015  . Surgery scrotal / testicular Right 1990's  . Incision and drainage abscess posterior cervicalspine  05/2012  . Cardiac catheterization  1998  . Tee without cardioversion N/A 01/26/2013    Procedure: TRANSESOPHAGEAL ECHOCARDIOGRAM (TEE);  Surgeon: Lelon Perla, MD;  Location: [facility];  Service: Cardiovascular;  Laterality: N/A;  [name] anes. /   . Cardioversion N/A 01/26/2013    Procedure: CARDIOVERSION;  Surgeon: Lelon Perla, MD;  Location[facility] ENDOSCOPY;  Service: Cardiovascular;  Laterality: N/A;  . Tee without cardioversion N/A 10/05/2014    Procedure: TRANSESOPHAGEAL ECHOCARDIOGRAM (TEE)  with cardioversion;  Surgeon: Thayer Headings, MD;  Location: [facility];  Service: Cardiovascular;  Laterality: N/A;  12:52 synched cardioversion at 120 joules,...afib to SR...12 lead EKG ordered.Marland KitchenMarland KitchenCardiozem d/c'ed per MD verbal order at [facility]  . Melanoma excision Left 2015    "lower leg; right at my knee"  . Colonoscopy with propofol N/A 02/10/2015    Procedure: COLONOSCOPY WITH PROPOFOL;  Surgeon: Gatha Mayer, MD;  Location: [facility] ENDOSCOPY;  Service: Endoscopy;  Laterality: N/A;  . Joint replacement    . Thoracic spine surgery  03/2000    "ground calcium deposits from upper thoracic" (01/26/2013)  . Ep implantable device N/A 03/08/2015    MDT Hillery Aldo CRT-D for nonischemic CM by Dr Rayann Heman for primary prevention  . Cardioversion N/A 03/23/2015    Procedure: CARDIOVERSION;  Surgeon: Jerline Pain, MD;  Location: [facility];  Service:  Cardiovascular;  Laterality: N/A;  . Tee without cardioversion N/A 03/23/2015    Procedure: TRANSESOPHAGEAL ECHOCARDIOGRAM (TEE);  Surgeon: Jerline Pain, MD;  Location: [facility];  Service: Cardiovascular;  Laterality: N/A;  . Enteroscopy N/A 08/17/2015    Procedure: ENTEROSCOPY;  Surgeon: Gatha Mayer, MD;  Location: [facility] ENDOSCOPY;  Service: Endoscopy;  Laterality: N/A;    Current Medications: Outpatient Prescriptions Prior to Visit  Medication Sig Dispense Refill  . acetaminophen (TYLENOL) 500 MG tablet Take 1,000 mg by mouth every 4 (four) hours as needed for moderate pain or headache.    [name] amiodarone (PACERONE) 200 MG tablet Take 1 tablet (200 mg total) by mouth daily. 30 tablet 11  . carvedilol (COREG) 12.5 MG tablet Take 1 tablet (12.5 mg total) by mouth 2 (two) times daily with a meal. 60 tablet 6  . dorzolamide-timolol (COSOPT) 22.3-6.8 MG/ML ophthalmic solution Place 1 drop into the right eye 2 (two) times daily.    [name] escitalopram (LEXAPRO) 20 MG tablet TAKE 1 TABLET DAILY 30 tablet 0  . ezetimibe (ZETIA) 10 MG tablet Take 1 tablet (10 mg total) by mouth daily. 90 tablet 3  . finasteride (PROSCAR) 5 MG tablet Take 5 mg by mouth every morning.     . furosemide (LASIX) 20 MG tablet Take 2 tablets (40 mg total) by mouth daily. 180 tablet 1  . gabapentin (NEURONTIN) 100 MG capsule Take 200-300 mg by mouth as directed.    . lipase/protease/amylase (CREON) 36000 UNITS CPEP capsule Take 36,000 Units by mouth as directed.    [name] losartan (COZAAR) 50 MG tablet Take 1 tablet every morning and 1/2 tablet every evening 135 tablet 3  . NIASPAN 500 MG CR tablet Take 1,000 mg by mouth at bedtime.     Chad Brown Glycol-Propyl Glycol (SYSTANE ULTRA OP) Place 1 drop into the left eye 2 (two) times daily.     . potassium chloride SA (K-DUR,KLOR-CON) 20 MEQ tablet Take 1 tablet (20 mEq total) by mouth 2 (two) times daily. 135 tablet 3  . rivaroxaban (XARELTO) 20 MG TABS tablet Take 1 tablet (20 mg  total) by mouth daily with supper. Restart tomorrow 08/18/2015 90 tablet 3  . spironolactone (ALDACTONE) 25 MG tablet Take 0.5 tablets (12.5 mg total) by mouth daily. 45 tablet 3  . tamsulosin (FLOMAX) 0.4 MG CAPS Take 0.4 mg by mouth at bedtime.      No facility-administered medications prior to visit.     Allergies:   Pravastatin sodium and Zithromax   Social History   Social History  . Marital Status: Married    Spouse Name: N/A  . Number of Children: 3  . Years of Education: N/A  Occupational History  . retired    Social History Main Topics  . Smoking status: Former Smoker -- 3.00 packs/day for 48 years    Types: Cigarettes    Quit date: 09/28/2000  . Smokeless tobacco: Never Used  . Alcohol Use: No  . Drug Use: No  . Sexual Activity: Yes   Other Topics Concern  . None   Social History Narrative   Pt lives in [place] with spouse.  3 children are grown and healthy.   Retired.  Ran a country store for 30 years, previously worked in the [employer] for 16 years     Family History:  The patient's family history includes Colon cancer in his paternal uncle; Diabetes in his maternal aunt, maternal grandmother, maternal uncle, and mother; Heart attack in his maternal uncle; Heart disease in his mother; Kidney disease in his mother; Leukemia in his father; Lung cancer in his paternal uncle; Prostate cancer in his paternal uncle.   ROS:   Please see the history of present illness.    ROS All other systems reviewed and are negative.   PHYSICAL EXAM:   VS:  BP 132/88 mmHg  Pulse 72  Ht 5\' 6"  (1.676 m)  Wt 214 lb 6.4 oz (97.251 kg)  BMI 34.62 kg/m2   GEN: Well nourished, well developed, in no acute distress HEENT: normal Neck: no JVD, carotid bruits, or masses Cardiac: RRR; no murmurs, rubs, or gallops,no edema.  Intact distal pulses bilaterally.  Respiratory:  clear to auscultation bilaterally, normal work of breathing GI: soft, nontender, nondistended, +  BS MS: no deformity or atrophy Skin: warm and dry, no rash Neuro:  Alert and Oriented x 3, Strength and sensation are intact Psych: euthymic mood, full affect  Wt Readings from Last 3 Encounters:  01/04/16 214 lb 6.4 oz (97.251 kg)  10/24/15 212 lb (96.163 kg)  08/14/15 197 lb (89.359 kg)      Studies/Labs Reviewed:   EKG:  EKG was not ordered today.   Recent Labs: 06/07/2015: TSH 1.124 07/21/2015: B Natriuretic Peptide 215.0* 11/04/2015: ALT 34; BUN 14; Creatinine, Ser 1.23; Hemoglobin 12.6*; Platelets 129*; Potassium 4.0; Sodium 142   Lipid Panel    Component Value Date/Time   CHOL 200 03/22/2015 0536   TRIG 105 03/22/2015 0536   HDL 26* 03/22/2015 0536   CHOLHDL 7.7 03/22/2015 0536   VLDL 21 03/22/2015 0536   LDLCALC 153* 03/22/2015 0536    Additional studies/ records that were reviewed today include:  Split night sleep study    ASSESSMENT:    1. OSA (obstructive sleep apnea)   2. Essential hypertension   3. Obesity      PLAN:  In order of problems listed above:  1. OSA - he is tolerating his CPAP well.  His d/l today showed an AHI of 1.3/hr on 14cm H2O and 93% compliance in using more than 4 hours nightly.  Patient has been using and benefiting from CPAP use and will continue to benefit from therapy. I am going to add a chin strap to help with mouth dryness.  I have recommended OTC nasal saline spray for nasal dryness.  I also instructed him on how to adjust the humidification.   2. HTN - controlled on current medical regimen.  Continue BB/ARB/diuretic 3. Obesity - I have encouraged him to continue exercising at the [facility]  Followup with me in 1 year    Medication Adjustments/Labs and Tests Ordered: Current medicines are reviewed at length with the  patient today.  Concerns regarding medicines are outlined above.  Medication changes, Labs and Tests ordered today are listed in the Patient Instructions below. There are no Patient Instructions on file for this  visit.   Lurena Nida, MD  01/04/2016 9:10 AM    [facility] [address], [place], [place]  [address] Phone: [phone]; Fax: [phone]

## 2016-01-11 MED ORDER — ESCITALOPRAM OXALATE 20 MG PO TABS: 20.0000 mg | ORAL_TABLET | Freq: Every day | ORAL | Status: DC

## 2016-01-16 IMAGING — CR DG CHEST 1V PORT
1 series · 1 of 1 positions shown · non-contrast
Comparison: 06/05/2013

CLINICAL DATA: Pt c/o shortness of breath x 3 days and 17 lb weight
gain since [DATE]. EKG done in triage shows pt to be in afib with
RVR. Hx: HTN, MI, CHF

EXAM:
PORTABLE CHEST - 1 VIEW

[AP]
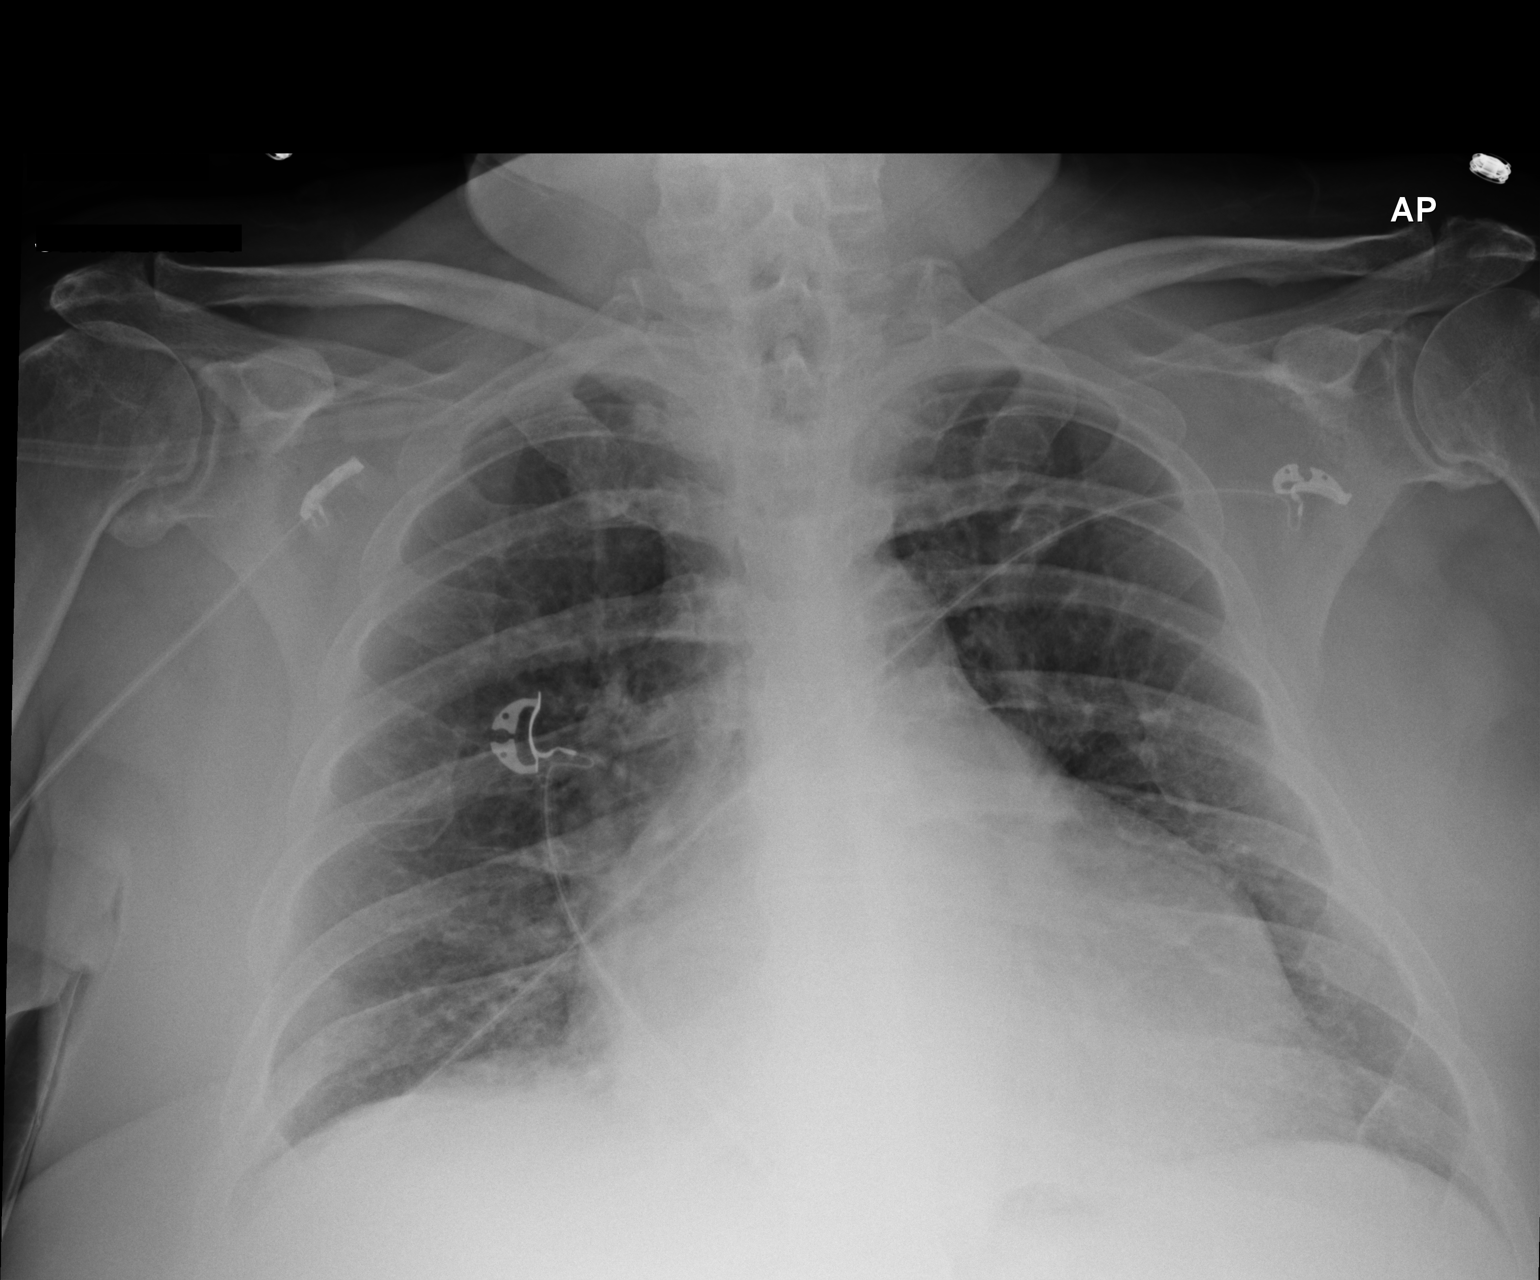

[1 of 1 positions shown; findings below may reference images not displayed]

FINDINGS: Stable cardiomegaly. Mild bibasilar interstitial opacities in both
lung bases. No confluent airspace consolidation.
No effusion.
Degenerative changes in bilateral shoulders
IMPRESSION: 1. Stable cardiomegaly with mild bibasilar interstitial opacities,
possibly subsegmental atelectasis versus early infiltrate.

## 2016-01-19 ENCOUNTER — Encounter (HOSPITAL_COMMUNITY): Payer: Self-pay | Admitting: Hematology & Oncology

## 2016-01-19 ENCOUNTER — Encounter (HOSPITAL_COMMUNITY): Payer: Medicare Other | Attending: Hematology & Oncology | Admitting: Hematology & Oncology

## 2016-01-19 ENCOUNTER — Encounter (HOSPITAL_COMMUNITY): Payer: Medicare Other

## 2016-01-19 VITALS — BP 137/85 | HR 88 | Temp 97.6°F | Resp 18 | Wt 216.0 lb

## 2016-01-19 DIAGNOSIS — D649 Anemia, unspecified: Secondary | ICD-10-CM | POA: Diagnosis not present

## 2016-01-19 DIAGNOSIS — E611 Iron deficiency: Secondary | ICD-10-CM

## 2016-01-19 DIAGNOSIS — R935 Abnormal findings on diagnostic imaging of other abdominal regions, including retroperitoneum: Secondary | ICD-10-CM | POA: Diagnosis not present

## 2016-01-19 DIAGNOSIS — Z85820 Personal history of malignant melanoma of skin: Secondary | ICD-10-CM

## 2016-01-19 DIAGNOSIS — D500 Iron deficiency anemia secondary to blood loss (chronic): Secondary | ICD-10-CM

## 2016-01-19 DIAGNOSIS — C439 Malignant melanoma of skin, unspecified: Secondary | ICD-10-CM | POA: Diagnosis not present

## 2016-01-19 DIAGNOSIS — I5022 Chronic systolic (congestive) heart failure: Secondary | ICD-10-CM

## 2016-01-19 DIAGNOSIS — D696 Thrombocytopenia, unspecified: Secondary | ICD-10-CM

## 2016-01-19 DIAGNOSIS — K862 Cyst of pancreas: Secondary | ICD-10-CM

## 2016-01-19 LAB — CBC WITH DIFFERENTIAL/PLATELET
Basophils Absolute: 0 10*3/uL (ref 0.0–0.1)
Basophils Relative: 1 %
EOS PCT: 2 %
Eosinophils Absolute: 0.1 10*3/uL (ref 0.0–0.7)
HEMATOCRIT: 36.8 % — AB (ref 39.0–52.0)
Hemoglobin: 12.5 g/dL — ABNORMAL LOW (ref 13.0–17.0)
LYMPHS ABS: 1.1 10*3/uL (ref 0.7–4.0)
LYMPHS PCT: 26 %
MCH: 32.3 pg (ref 26.0–34.0)
MCHC: 34 g/dL (ref 30.0–36.0)
MCV: 95.1 fL (ref 78.0–100.0)
MONO ABS: 0.4 10*3/uL (ref 0.1–1.0)
Monocytes Relative: 8 %
NEUTROS ABS: 2.8 10*3/uL (ref 1.7–7.7)
Neutrophils Relative %: 63 %
PLATELETS: 125 10*3/uL — AB (ref 150–400)
RBC: 3.87 MIL/uL — AB (ref 4.22–5.81)
RDW: 14.5 % (ref 11.5–15.5)
WBC: 4.4 10*3/uL (ref 4.0–10.5)

## 2016-01-19 LAB — IRON AND TIBC
IRON: 65 ug/dL (ref 45–182)
Saturation Ratios: 21 % (ref 17.9–39.5)
TIBC: 308 ug/dL (ref 250–450)
UIBC: 243 ug/dL

## 2016-01-19 LAB — COMPREHENSIVE METABOLIC PANEL
ALT: 24 U/L (ref 17–63)
AST: 23 U/L (ref 15–41)
Albumin: 3.7 g/dL (ref 3.5–5.0)
Alkaline Phosphatase: 74 U/L (ref 38–126)
Anion gap: 7 (ref 5–15)
BILIRUBIN TOTAL: 0.4 mg/dL (ref 0.3–1.2)
BUN: 10 mg/dL (ref 6–20)
CALCIUM: 8.7 mg/dL — AB (ref 8.9–10.3)
CHLORIDE: 106 mmol/L (ref 101–111)
CO2: 30 mmol/L (ref 22–32)
CREATININE: 1.15 mg/dL (ref 0.61–1.24)
Glucose, Bld: 134 mg/dL — ABNORMAL HIGH (ref 65–99)
Potassium: 4.2 mmol/L (ref 3.5–5.1)
Sodium: 143 mmol/L (ref 135–145)
TOTAL PROTEIN: 6.8 g/dL (ref 6.5–8.1)

## 2016-01-19 LAB — FERRITIN: FERRITIN: 66 ng/mL (ref 24–336)

## 2016-01-19 NOTE — Patient Instructions (Addendum)
[facility] Discharge Instructions  RECOMMENDATIONS MADE BY THE CONSULTANT AND ANY TEST RESULTS WILL BE SENT TO YOUR REFERRING PHYSICIAN.   Exam and discussion by Dr Whitney Muse today If your mucous doesn't get any better after the allergy season ends, please let us know and we can get you over to see ENT  Hemoglobin 12.5 iron levels pending we will call you with those results  Try playing with your Zetia dose, to see if that helps with your diarrhea, if that doesn't seem to help then you can try to take imodium in the morning. Scans prior to next [place] every 2 months  Return to see the doctor in 6 months Please call the clinic if you have any questions or concerns     Thank you for choosing [facility] at [facility] to provide your oncology and hematology care.  To afford each patient quality time with our provider, please arrive at least 15 minutes before your scheduled appointment time.   Beginning January 23rd 2017 lab work for the [facility] will be done in the  Main lab at [facility] on 1st floor. If you have a lab appointment with the [facility] please come in thru the  Main Entrance and check in at the main information desk  You need to re-schedule your appointment should you arrive 10 or more minutes late.  We strive to give you quality time with our providers, and arriving late affects you and other patients whose appointments are after yours.  Also, if you no show three or more times for appointments you may be dismissed from the clinic at the providers discretion.     Again, thank you for choosing [facility].  Our hope is that these requests will decrease the amount of time that you wait before being seen by our physicians.       _____________________________________________________________  Should you have questions after your visit to [facility], please contact our office at [phone]  [phone] between the hours of 8:30 a.m. and 4:30 p.m.  Voicemails left after 4:30 p.m. will not be returned until the following business day.  For prescription refill requests, have your pharmacy contact our office.         Resources For Cancer Patients and their Caregivers ? American Cancer Society: Can assist with transportation, wigs, general needs, runs Look Good Feel Better.        (334)134-8782 ? Cancer Care: Provides financial assistance, online support groups, medication/co-pay assistance.  1-800-813-HOPE 5701456775) ? [facility] Assists [place] cancer patients and their families through emotional , educational and financial support.  708-748-5303 ? [place] DSS Where to apply for food stamps, Medicaid and utility assistance. 5091406819 ? [facility]: Transportation to medical appointments. 470-390-9453 ? Social Security Administration: May apply for disability if have a Stage IV cancer. 5174704286 [phone] ? [employer], Disability and Transit Services: Assists with nutrition, care and transit needs. (606)322-9672

## 2016-01-19 NOTE — Progress Notes (Signed)
[facility] NOTE  Patient Care Team: Dione Housekeeper, MD as PCP - General (Family Medicine)  [address] / [place] [id number]   CHIEF COMPLAINTS/PURPOSE OF CONSULTATION:  T2a N0 Melanoma of the L Thigh s/p WLE and sentinel node with Dr. Freddi Che Abnormal CT and MRI imaging of the abdomen Anemia Iron deficiency on oral iron and requiring intermittent IV iron replacement  HISTORY OF PRESENTING ILLNESS:  Chad Brown 75 y.o. male is here because of a history of melanoma and abnormal imaging of the abdomen. He follows with Dr. Edrick Oh for his primary medical care.  He is also followed for an iridociliary lesion of the left eye which is longstanding that has been diagnosed as either a nevus vs.a slow growing melanoma.   He was seen by Dr. Freddi Che at [facility] for a T2a melanoma of the left thigh diagnosed in 06/2014. He underwent a WLE and sentinel node procedure on 06/18/2014 with negative sentinel nodes. He has an established follow-up schedule with Dr. Freddi Che and Dr. Nevada Crane his local dermatologist.  Because of the findings in his eye he underwent a CT of the abdomen and pelvis with contrast on September 5 of 2014. There was an abnormality noted along the ventral surface of the pancreatic head. Follow-up imaging was recommended. A borderline enlarged right external iliac lymph node was noted at 1.2 cm. Repeat CT imaging was done on 12/18/2013 and was stable. Last imaging of the abdomen on 07/19/2014 was stable comparing back to 06/05/2013.   He is doing better from a cardiac perspective. He has done well with IV iron in the past, he is intolerant of oral iron. His mood is good, his heart is okay and his weight is good.   His legs are feeling better. He has had no problems with them and he has not found any new lumps or bumps.   He stated that he has had some diarrhea occasionally. Some of the times it can be severe. He cannot predict when it happens but it  usually happens in the morning about an hour after he eats breakfast. He said that sometimes he can barely make it to the bathroom. "when my stomach starts rolling I know that I haven't got but a few minutes. The diarrhea does not last all day and is usually over at lunch time. He said that this has happened twice this week. This could be because of his Zetia medication. He usually takes this at night.He had both an upper and lower endoscopy last year.  His appetite is good.   He stated that he has fallen 2 times since his last visit. One time he was fixing the tires on his car and stood up and fell right back. The second time he missed his chair and fell.   He goes to the [facility] 3 times a week to exercise.  He denies any abdominal pain.   He denies any new medications.   He gets a skin exam by Dr. Nevada Crane every 3 months.   He goes to see his eye doctor in [place] in June for his right eye.   MEDICAL HISTORY:  Past Medical History  Diagnosis Date  . Hyperlipidemia   . Hypertension   . Chronic systolic CHF (congestive heart failure) ([place])     a. New dx 12/2012 ? NICM, may be r/t afib. b. Nuc 03/2013 - normal;  c. 03/2015 TEE EF 15-20%.  [name] PAF (paroxysmal atrial fibrillation) (  [place])     a. Dx 12/2012, s/p TEE/DCCV 01/26/13. b. On Xarelto (CHA2DS2VASc = 3);  c. 03/2015 TEE (EF 15-20%, no LAA thrombus) and DCCV - amio increased to 200 mg bid.  . Anemia     iron deficient  . Cholelithiasis   . Atherosclerosis   . Colon polyp, hyperplastic 5/16    removed precancerous lesions  . AICD (automatic cardioverter/defibrillator) present 03/08/2015    MDT CRTD   . Chronic systolic CHF (congestive heart failure) ([place])   . Myocardial infarction ([place]) 1998  . Arthritis     "about all my joints; hands, knees, back" (03/08/2015)  . Urinary hesitancy due to benign prostatic hypertrophy   . Melanoma of eye ([place]) 2000's    "right; it's never been biopsied"  . Melanoma of lower leg ([place]) 2015    "left; right at  my knee"  . Adenomatous colon polyp     tubular  . Diverticulosis   . OSA (obstructive sleep apnea) 01/04/2016    SURGICAL HISTORY: Past Surgical History  Procedure Laterality Date  . Back surgery    . Total hip arthroplasty Right 06/2007  . Cataract extraction Right ~ 2006  . Glaucoma surgery Right ~ 2006    "put 3 stents in to drain fluid" (03/08/2015)  . Refractive surgery Right ~ 2006 X 2    "twice; both done at [facility]" (03/08/2015  . Surgery scrotal / testicular Right 1990's  . Incision and drainage abscess posterior cervicalspine  05/2012  . Cardiac catheterization  1998  . Tee without cardioversion N/A 01/26/2013    Procedure: TRANSESOPHAGEAL ECHOCARDIOGRAM (TEE);  Surgeon: Lelon Perla, MD;  Location: [facility];  Service: Cardiovascular;  Laterality: N/A;  [name] anes. /   . Cardioversion N/A 01/26/2013    Procedure: CARDIOVERSION;  Surgeon: Lelon Perla, MD;  Location: [facility] ENDOSCOPY;  Service: Cardiovascular;  Laterality: N/A;  . Tee without cardioversion N/A 10/05/2014    Procedure: TRANSESOPHAGEAL ECHOCARDIOGRAM (TEE)  with cardioversion;  Surgeon: Thayer Headings, MD;  Location: [facility] ENDOSCOPY;  Service: Cardiovascular;  Laterality: N/A;  12:52 synched cardioversion at 120 joules,...afib to SR...12 lead EKG ordered.Marland KitchenMarland KitchenCardiozem d/c'ed per MD verbal order at [facility]  . Melanoma excision Left 2015    "lower leg; right at my knee"  . Colonoscopy with propofol N/A 02/10/2015    Procedure: COLONOSCOPY WITH PROPOFOL;  Surgeon: Gatha Mayer, MD;  Location: [facility] ENDOSCOPY;  Service: Endoscopy;  Laterality: N/A;  . Joint replacement    . Thoracic spine surgery  03/2000    "ground calcium deposits from upper thoracic" (01/26/2013)  . Ep implantable device N/A 03/08/2015    MDT Hillery Aldo CRT-D for nonischemic CM by Dr Rayann Heman for primary prevention  . Cardioversion N/A 03/23/2015    Procedure: CARDIOVERSION;  Surgeon: Jerline Pain, MD;  Location: [facility];  Service: Cardiovascular;  Laterality:  N/A;  . Tee without cardioversion N/A 03/23/2015    Procedure: TRANSESOPHAGEAL ECHOCARDIOGRAM (TEE);  Surgeon: Jerline Pain, MD;  Location: [facility];  Service: Cardiovascular;  Laterality: N/A;  . Enteroscopy N/A 08/17/2015    Procedure: ENTEROSCOPY;  Surgeon: Gatha Mayer, MD;  Location: [facility] ENDOSCOPY;  Service: Endoscopy;  Laterality: N/A;    SOCIAL HISTORY: Social History   Social History  . Marital Status: Married    Spouse Name: N/A  . Number of Children: 3  . Years of Education: N/A   Occupational History  . retired    Social History Main Topics  .  Smoking status: Former Smoker -- 3.00 packs/day for 48 years    Types: Cigarettes    Quit date: 09/28/2000  . Smokeless tobacco: Never Used  . Alcohol Use: No  . Drug Use: No  . Sexual Activity: Yes   Other Topics Concern  . Not on file   Social History Narrative   Pt lives in [place] with spouse.  3 children are grown and healthy.   Retired.  Ran a country store for 30 years, previously worked in the [employer] for 16 years   Married for 40 years Born in [employer]. He was a Scientist, water quality, Therapist, occupational, Radio broadcast assistant. 3 sons and 3 grandchildren  Quit smoking 14 years ago Plays golf.  FAMILY HISTORY: Family History  Problem Relation Age of Onset  . Kidney disease Mother   . Heart disease Mother     MI, open heart  . Diabetes Mother     dialysis  . Leukemia Father   . Colon cancer Paternal Uncle   . Lung cancer Paternal Uncle     x 2  . Prostate cancer Paternal Uncle   . Diabetes Maternal Grandmother   . Heart attack Maternal Uncle   . Diabetes Maternal Aunt     x 3  . Diabetes Maternal Uncle    indicated that his mother is deceased. He indicated that his father is deceased.    Father passed away at 57 of leukemia  Mother passed away at 49 of diabetes, kidney disease and CHF.  1 brother and 2 sisters. Sister had throat cancer. Another sister  had skin cancer.  ALLERGIES:  is allergic to pravastatin sodium and zithromax.  MEDICATIONS:  Current Outpatient Prescriptions  Medication Sig Dispense Refill  . acetaminophen (TYLENOL) 500 MG tablet Take 1,000 mg by mouth every 4 (four) hours as needed for moderate pain or headache.    [name] amiodarone (PACERONE) 200 MG tablet Take 1 tablet (200 mg total) by mouth daily. 30 tablet 11  . carvedilol (COREG) 12.5 MG tablet Take 1 tablet (12.5 mg total) by mouth 2 (two) times daily with a meal. 60 tablet 6  . dorzolamide-timolol (COSOPT) 22.3-6.8 MG/ML ophthalmic solution Place 1 drop into the right eye 2 (two) times daily.    [name] escitalopram (LEXAPRO) 20 MG tablet Take 1 tablet (20 mg total) by mouth daily. 90 tablet 1  . ezetimibe (ZETIA) 10 MG tablet Take 1 tablet (10 mg total) by mouth daily. 90 tablet 3  . finasteride (PROSCAR) 5 MG tablet Take 5 mg by mouth every morning.     . furosemide (LASIX) 20 MG tablet Take 2 tablets (40 mg total) by mouth daily. 180 tablet 1  . gabapentin (NEURONTIN) 100 MG capsule Take 200-300 mg by mouth as directed.    . lipase/protease/amylase (CREON) 36000 UNITS CPEP capsule Take 36,000 Units by mouth as directed.    [name] losartan (COZAAR) 50 MG tablet Take 1 tablet every morning and 1/2 tablet every evening 135 tablet 3  . NIASPAN 500 MG CR tablet Take 1,000 mg by mouth at bedtime.     [name] Glycol-Propyl Glycol (SYSTANE ULTRA OP) Place 1 drop into the left eye 2 (two) times daily.     . potassium chloride SA (K-DUR,KLOR-CON) 20 MEQ tablet Take 1 tablet (20 mEq total) by mouth 2 (two) times daily. 135 tablet 3  . rivaroxaban (XARELTO) 20 MG TABS tablet Take 1 tablet (20 mg total) by mouth  daily with supper. Restart tomorrow 08/18/2015 90 tablet 3  . sodium chloride (OCEAN) 0.65 % SOLN nasal spray Place 2 sprays into both nostrils 2 (two) times daily.  0  . spironolactone (ALDACTONE) 25 MG tablet Take 0.5 tablets (12.5 mg total) by mouth daily. 45 tablet 3  .  tamsulosin (FLOMAX) 0.4 MG CAPS Take 0.4 mg by mouth at bedtime.      No current facility-administered medications for this visit.    Review of Systems  Constitutional: Negative for fever, chills, weight loss and malaise/fatigue.  HENT: Negative for congestion, hearing loss, nosebleeds, sore throat and tinnitus.   Eyes: Negative for blurred vision, double vision, pain and discharge.  Respiratory: Positive for cough. Negative for hemoptysis, sputum production, shortness of breath and wheezing.   Occasional cough. May be allergy related.  Cardiovascular: Negative for chest pain, palpitations, claudication, leg swelling and PND. Recent pacemaker placement Gastrointestinal: Positive for diarrhea Negative for heartburn, nausea, vomiting, abdominal pain, constipation, blood in stool and melena.  Occasional diarrhea sometimes severe. In the morning not afternoon. May be because of his Zetia medication.  Genitourinary: Negative for frequency. Negative for dysuria, urgency and hematuria.  Musculoskeletal: Negative for myalgias, joint pain and falls. Positive for back pain Skin: Negative for itching and rash.  Neurological: Negative for dizziness, tingling, tremors, sensory change, speech change, focal weakness, seizures, loss of consciousness, weakness and headaches.  Endo/Heme/Allergies: Does not bruise/bleed easily.  Psychiatric/Behavioral: Negative for suicidal ideas, memory loss and substance abuse. The patient is not nervous/anxious and does not have insomnia.  14 point ROS was done and is otherwise as detailed above or in HPI   PHYSICAL EXAMINATION: ECOG PERFORMANCE STATUS: 0 - Asymptomatic  Filed Vitals:   01/19/16 1326  BP: 137/85  Pulse: 88  Temp: 97.6 F (36.4 C)  Resp: 18   Filed Weights   01/19/16 1326  Weight: 216 lb (97.977 kg)     Physical Exam  Constitutional: He is oriented to person, place, and time and well-developed, well-nourished, and in no distress.  HENT:    Head: Normocephalic and atraumatic.  Nose: Nose normal.  Mouth/Throat: Oropharynx is clear and moist. No oropharyngeal exudate.  Eyes: Conjunctivae and EOM are normal. Pupils are equal, round, and reactive to light. Right eye exhibits no discharge. Left eye exhibits no discharge. No scleral icterus.  Lower portion of right iris has abnormal shape and color  Neck: Normal range of motion. Neck supple. No tracheal deviation present. No thyromegaly present.  Cardiovascular: Normal rate, regular rhythm and normal heart sounds.  Exam reveals no gallop and no friction rub.   No murmur heard. Pulmonary/Chest: Effort normal and breath sounds normal. He has no wheezes. He has no rales.  Abdominal: Soft. Bowel sounds are normal. He exhibits no distension and no mass. There is no tenderness. There is no rebound and no guarding.  Musculoskeletal: Normal range of motion. He exhibits no edema.  On Inferior aspect of his incision the left thigh there is an area of palpable firmness. (patient notes this is chronic) Remainder of surgical site is without abnormality. No other palpable nodules noted. L groin without palpable abnormality  Lymphadenopathy:    He has no cervical adenopathy.  Neurological: He is alert and oriented to person, place, and time. He has normal reflexes. No cranial nerve deficit. Gait normal. Coordination normal.  Skin: Skin is warm and dry. No rash noted.  Psychiatric: Mood, memory, affect and judgment normal.  Nursing note and vitals reviewed.  LABORATORY DATA:  I have reviewed the data as listed Results for ZYKEE, AVAKIAN (MRN [id number]) as of 01/19/2016 14:25  Ref. Range 01/19/2016 13:10  Sodium Latest Ref Range: 135-145 mmol/L 143  Potassium Latest Ref Range: 3.5-5.1 mmol/L 4.2  Chloride Latest Ref Range: 101-111 mmol/L 106  CO2 Latest Ref Range: 22-32 mmol/L 30  BUN Latest Ref Range: 6-20 mg/dL 10  Creatinine Latest Ref Range: 0.61-1.24 mg/dL 1.15  Calcium Latest Ref  Range: 8.9-10.3 mg/dL 8.7 (L)  EGFR (Non-African Amer.) Latest Ref Range: >60 mL/min >60  EGFR (African American) Latest Ref Range: >60 mL/min >60  Glucose Latest Ref Range: 65-99 mg/dL 134 (H)  Anion gap Latest Ref Range: 5-15  7  Alkaline Phosphatase Latest Ref Range: 38-126 U/L 74  Albumin Latest Ref Range: 3.5-5.0 g/dL 3.7  AST Latest Ref Range: 15-41 U/L 23  ALT Latest Ref Range: 17-63 U/L 24  Total Protein Latest Ref Range: 6.5-8.1 g/dL 6.8  Total Bilirubin Latest Ref Range: 0.3-1.2 mg/dL 0.4  WBC Latest Ref Range: 4.0-10.5 K/uL 4.4  RBC Latest Ref Range: 4.22-5.81 MIL/uL 3.87 (L)  Hemoglobin Latest Ref Range: 13.0-17.0 g/dL 12.5 (L)  HCT Latest Ref Range: 39.0-52.0 % 36.8 (L)  MCV Latest Ref Range: 78.0-100.0 fL 95.1  MCH Latest Ref Range: 26.0-34.0 pg 32.3  MCHC Latest Ref Range: 30.0-36.0 g/dL 34.0  RDW Latest Ref Range: 11.5-15.5 % 14.5  Platelets Latest Ref Range: 150-400 K/uL 125 (L)  Neutrophils Latest Units: % 63  Lymphocytes Latest Units: % 26  Monocytes Relative Latest Units: % 8  Eosinophil Latest Units: % 2  Basophil Latest Units: % 1  NEUT# Latest Ref Range: 1.7-7.7 K/uL 2.8  Lymphocyte # Latest Ref Range: 0.7-4.0 K/uL 1.1  Monocyte # Latest Ref Range: 0.1-1.0 K/uL 0.4  Eosinophils Absolute Latest Ref Range: 0.0-0.7 K/uL 0.1  Basophils Absolute Latest Ref Range: 0.0-0.1 K/uL 0.0    RADIOGRAPHIC STUDIES: I have personally reviewed the radiological images as listed and agreed with the findings in the report. CLINICAL DATA: Follow up pancreatic lesions. No current complaints. History of melanoma. Patient now has a pacemaker. Subsequent encounter.  EXAM: CT ABDOMEN AND PELVIS WITH CONTRAST  TECHNIQUE: Multidetector CT imaging of the abdomen and pelvis was performed using the standard protocol following bolus administration of intravenous contrast.  CONTRAST: 177m OMNIPAQUE IOHEXOL 300 MG/ML SOLN  COMPARISON: CT 12/18/2013 and 06/05/2013. MRI  01/03/2015.  FINDINGS: Lower chest: Clear lung bases. No significant pleural or pericardial effusion. Cardiac pacemaker noted.  Hepatobiliary: Tiny low-density hepatic lesions are unchanged, likely cysts. No worrisome hepatic findings. There are small calcified gallstones. No gallbladder wall thickening or biliary dilatation.  Pancreas: The pancreas has a stable appearance by CT. The 1.5 cm low-density lesion projecting anteriorly from the pancreatic head on image 16 is unchanged. The other tiny lesions on MRI are not clearly visualized. There is no evidence of pancreatic ductal dilatation or surrounding inflammation.  Spleen: Normal in size without focal abnormality.  Adrenals/Urinary Tract: Both adrenal glands appear normal. There are stable bilateral renal cysts. No evidence of renal mass, hydronephrosis or urinary tract calculus. The bladder appears unremarkable.  Stomach/Bowel: There are diverticular changes throughout the distal colon with some mural thickening in the sigmoid colon. No evidence of surrounding inflammation, obstruction or extraluminal fluid collection. The appendix appears normal.  Vascular/Lymphatic: There are no enlarged abdominal or pelvic lymph nodes. There are stable small lymph nodes within the porta hepatis. Atherosclerosis of the aorta, its branches and the iliac arteries  again noted.  Reproductive: The prostate gland and seminal vesicles appear unchanged.  Other: No evidence of abdominal wall mass or hernia.  Musculoskeletal: No acute or significant osseous findings. Right hip arthroplasty and diffuse lumbar spondylosis noted.  IMPRESSION: 1. The pancreas has a stable appearance compared with prior CTs dating back to 06/05/2013. The small cystic lesions are therefore considered benign and incidental. 2. No acute findings. 3. Stable incidental findings including cholelithiasis, bilateral renal cysts, atherosclerosis and colonic  diverticulosis.   Electronically Signed  By: Richardean Sale M.D.  On: 07/20/2015 12:38    ASSESSMENT & PLAN:  T2a N0 Melanoma of the L Thigh s/p WLE and sentinel node with Dr. Freddi Che Abnormal CT and MRI imaging of the abdomen Anemia/Thrombocytopenia Colonoscopy 02/10/2015 with 5 polyps and sigmoid diverticulosis Iron deficiency with intolerance to oral iron  His anemia is much improved, he is intolerant of oral iron. The most likely cause of his anemia is due to chronic blood loss/malabsorption syndrome. He follows with GI in [place]. We will replace iron accordingly.  Thrombocytopenia is fairly stable. We will continue to monitor this.  On exam there is no obvious evidence of recurrence of his melanoma. CT imaging reveals stability of the pancreas dating back 2 years.  RTC in 6 months.   We discussed changing the time he takes his zetia to see if this improves his bowel problems.   He gets a skin exam by Dr. Nevada Crane every 3 months.   He goes to see his eye doctor in [place] in June for his right eye.   Orders Placed This Encounter  Procedures  . CT Abdomen Pelvis W Contrast    JH/AMY    [employer] MEDICARE    LABS WILL BE DRAWN    WILL P/U READICAT     Standing Status: Future     Number of Occurrences:      Standing Expiration Date: 01/18/2017    Order Specific Question:  If indicated for the ordered procedure, I authorize the administration of contrast media per Radiology protocol    Answer:  Yes    Order Specific Question:  Reason for Exam (SYMPTOM  OR DIAGNOSIS REQUIRED)    Answer:  follow-up pancreatic cyst    Order Specific Question:  Preferred imaging location?    Answer:  [facility]  . CBC with Differential    Standing Status: Standing     Number of Occurrences: 12     Standing Expiration Date: 01/18/2018  . Comprehensive metabolic panel    Standing Status: Standing     Number of Occurrences: 12     Standing Expiration Date: 01/18/2018  . Iron and TIBC     Standing Status: Standing     Number of Occurrences: 12     Standing Expiration Date: 01/18/2018  . Ferritin    Standing Status: Standing     Number of Occurrences: 12     Standing Expiration Date: 01/18/2018     All questions were answered. The patient knows to call the clinic with any problems, questions or concerns.   This document serves as a record of services personally performed by Ancil Linsey, MD. It was created on her behalf by Kandace Blitz, a trained medical scribe. The creation of this record is based on the scribe's personal observations and the provider's statements to them. This document has been checked and approved by the attending provider.  I have reviewed the above documentation for accuracy and completeness, and I agree with  the above.  This note was electronically signed.  Kelby Fam. Whitney Muse, MD

## 2016-01-22 ENCOUNTER — Encounter (HOSPITAL_COMMUNITY): Payer: Self-pay | Admitting: Hematology & Oncology

## 2016-01-23 NOTE — Progress Notes (Signed)
Patient ID: Chad Brown, male   DOB: 09-07-1941, 75 y.o.   MRN: [id number]    Electrophysiology Office Note Date: 01/24/2016  ID:  Chad Brown, DOB [birth year], MRN [id number]  PCP: Sherrie Mustache, MD Primary Cardiologist: Johnsie Cancel Electrophysiologist: Allred  CC: Post hospitalization follow up for AF  Chad Brown is a 75 y.o. male  With PAF/DCM.  He was admitted 03/21/15 with worsening shortness of breath and a 7 pound weight gain and was found to be in AF with RVR.  His amiodarone was increased and he underwent cardioversion prior to discharge.  His ARB was held 2/2 acute on chronic renal insufficiency ARB restarted June 2016  by PA  Amiodarone decreased to 300mg  dayly labs ok  On xaretlo for anticoagulation   Device History: MDT CRTD implanted 2016 for NICM, CHF History of appropriate therapy: No History of AAD therapy: Yes - amiodarone for AF  03/23/15  EF 15-20%  By TEE  OSA:  Sees Turner CPAP titrated    Past Medical History  Diagnosis Date  . Hyperlipidemia   . Hypertension   . Chronic systolic CHF (congestive heart failure) ([place])     a. New dx 12/2012 ? NICM, may be r/t afib. b. Nuc 03/2013 - normal;  c. 03/2015 TEE EF 15-20%.  [name] PAF (paroxysmal atrial fibrillation) ([place])     a. Dx 12/2012, s/p TEE/DCCV 01/26/13. b. On Xarelto (CHA2DS2VASc = 3);  c. 03/2015 TEE (EF 15-20%, no LAA thrombus) and DCCV - amio increased to 200 mg bid.  . Anemia     iron deficient  . Cholelithiasis   . Atherosclerosis   . Colon polyp, hyperplastic 5/16    removed precancerous lesions  . AICD (automatic cardioverter/defibrillator) present 03/08/2015    MDT CRTD   . Chronic systolic CHF (congestive heart failure) ([place])   . Myocardial infarction ([place]) 1998  . Arthritis     "about all my joints; hands, knees, back" (03/08/2015)  . Urinary hesitancy due to benign prostatic hypertrophy   . Melanoma of eye ([place]) 2000's    "right; it's never been biopsied"  . Melanoma of lower leg ([place])  2015    "left; right at my knee"  . Adenomatous colon polyp     tubular  . Diverticulosis   . OSA (obstructive sleep apnea) 01/04/2016   Past Surgical History  Procedure Laterality Date  . Back surgery    . Total hip arthroplasty Right 06/2007  . Cataract extraction Right ~ 2006  . Glaucoma surgery Right ~ 2006    "put 3 stents in to drain fluid" (03/08/2015)  . Refractive surgery Right ~ 2006 X 2    "twice; both done at [place]" (03/08/2015  . Surgery scrotal / testicular Right 1990's  . Incision and drainage abscess posterior cervicalspine  05/2012  . Cardiac catheterization  1998  . Tee without cardioversion N/A 01/26/2013    Procedure: TRANSESOPHAGEAL ECHOCARDIOGRAM (TEE);  Surgeon: Lelon Perla, MD;  Location: [place] [facility];  Service: Cardiovascular;  Laterality: N/A;  [name] anes. /   . Cardioversion N/A 01/26/2013    Procedure: CARDIOVERSION;  Surgeon: Lelon Perla, MD;  Location: [facility] ENDOSCOPY;  Service: Cardiovascular;  Laterality: N/A;  . Tee without cardioversion N/A 10/05/2014    Procedure: TRANSESOPHAGEAL ECHOCARDIOGRAM (TEE)  with cardioversion;  Surgeon: Thayer Headings, MD;  Location[facility] ENDOSCOPY;  Service: Cardiovascular;  Laterality: N/A;  12:52 synched cardioversion at 120 joules,...afib to SR...12 lead EKG ordered.Marland KitchenMarland KitchenCardiozem d/c'ed per MD verbal order  at [place]  . Melanoma excision Left 2015    "lower leg; right at my knee"  . Colonoscopy with propofol N/A 02/10/2015    Procedure: COLONOSCOPY WITH PROPOFOL;  Surgeon: Gatha Mayer, MD;  Location: [facility] ENDOSCOPY;  Service: Endoscopy;  Laterality: N/A;  . Joint replacement    . Thoracic spine surgery  03/2000    "ground calcium deposits from upper thoracic" (01/26/2013)  . Ep implantable device N/A 03/08/2015    MDT Hillery Aldo CRT-D for nonischemic CM by Dr Rayann Heman for primary prevention  . Cardioversion N/A 03/23/2015    Procedure: CARDIOVERSION;  Surgeon: Jerline Pain, MD;  Location: [place];  Service: Cardiovascular;   Laterality: N/A;  . Tee without cardioversion N/A 03/23/2015    Procedure: TRANSESOPHAGEAL ECHOCARDIOGRAM (TEE);  Surgeon: Jerline Pain, MD;  Location: [place];  Service: Cardiovascular;  Laterality: N/A;  . Enteroscopy N/A 08/17/2015    Procedure: ENTEROSCOPY;  Surgeon: Gatha Mayer, MD;  Location: [facility] ENDOSCOPY;  Service: Endoscopy;  Laterality: N/A;    Current Outpatient Prescriptions  Medication Sig Dispense Refill  . acetaminophen (TYLENOL) 500 MG tablet Take 1,000 mg by mouth every 4 (four) hours as needed for moderate pain or headache.    [name] amiodarone (PACERONE) 200 MG tablet Take 1 tablet (200 mg total) by mouth daily. 30 tablet 11  . carvedilol (COREG) 12.5 MG tablet Take 1 tablet (12.5 mg total) by mouth 2 (two) times daily with a meal. 60 tablet 6  . dorzolamide-timolol (COSOPT) 22.3-6.8 MG/ML ophthalmic solution Place 1 drop into the right eye 2 (two) times daily.    [name] escitalopram (LEXAPRO) 20 MG tablet Take 1 tablet (20 mg total) by mouth daily. 90 tablet 1  . ezetimibe (ZETIA) 10 MG tablet Take 1 tablet (10 mg total) by mouth daily. 90 tablet 3  . finasteride (PROSCAR) 5 MG tablet Take 5 mg by mouth every morning.     . furosemide (LASIX) 20 MG tablet Take 2 tablets (40 mg total) by mouth daily. 180 tablet 1  . gabapentin (NEURONTIN) 100 MG capsule Take 200-300 mg by mouth as directed.    . lipase/protease/amylase (CREON) 36000 UNITS CPEP capsule Take 36,000 Units by mouth as directed.    [name] losartan (COZAAR) 50 MG tablet Take 25-50 mg by mouth as directed.    [name] NIASPAN 500 MG CR tablet Take 1,000 mg by mouth at bedtime.     [name] Glycol-Propyl Glycol (SYSTANE ULTRA OP) Place 1 drop into the left eye 2 (two) times daily.     . potassium chloride SA (K-DUR,KLOR-CON) 20 MEQ tablet Take 1 tablet (20 mEq total) by mouth 2 (two) times daily. 135 tablet 3  . rivaroxaban (XARELTO) 20 MG TABS tablet Take 1 tablet (20 mg total) by mouth daily with supper. Restart tomorrow  08/18/2015 90 tablet 3  . spironolactone (ALDACTONE) 25 MG tablet Take 0.5 tablets (12.5 mg total) by mouth daily. 45 tablet 3  . tamsulosin (FLOMAX) 0.4 MG CAPS Take 0.4 mg by mouth at bedtime.      No current facility-administered medications for this visit.    Allergies:   Pravastatin sodium and Zithromax   Social History: Social History   Social History  . Marital Status: Married    Spouse Name: N/A  . Number of Children: 3  . Years of Education: N/A   Occupational History  . retired    Social History Main Topics  . Smoking status: Former Smoker -- 3.00 packs/day  for 48 years    Types: Cigarettes    Quit date: 09/28/2000  . Smokeless tobacco: Never Used  . Alcohol Use: No  . Drug Use: No  . Sexual Activity: Yes   Other Topics Concern  . Not on file   Social History Narrative   Pt lives in [place] with spouse.  3 children are grown and healthy.   Retired.  Ran a country store for 30 years, previously worked in the [employer] for 16 years    Family History: Family History  Problem Relation Age of Onset  . Kidney disease Mother   . Heart disease Mother     MI, open heart  . Diabetes Mother     dialysis  . Leukemia Father   . Colon cancer Paternal Uncle   . Lung cancer Paternal Uncle     x 2  . Prostate cancer Paternal Uncle   . Diabetes Maternal Grandmother   . Heart attack Maternal Uncle   . Diabetes Maternal Aunt     x 3  . Diabetes Maternal Uncle     Review of Systems: All other systems reviewed and are otherwise negative except as noted above.   Physical Exam: VS:  BP 124/64 mmHg  Pulse 67  Ht 5\' 6"  (1.676 m)  Wt 98.431 kg (217 lb)  BMI 35.04 kg/m2  SpO2 96% , BMI Body mass index is 35.04 kg/(m^2).  GEN- The patient is well appearing, alert and oriented x 3 today.   HEENT: normocephalic, atraumatic; sclera clear, conjunctiva pink; hearing intact; oropharynx clear; neck supple  Lungs- Clear to ausculation bilaterally, normal  work of breathing.  No wheezes, rales, rhonchi Heart- Regular rate and rhythm (paced) GI- soft, non-tender, non-distended, bowel sounds present  Extremities- no clubbing, cyanosis, or edema; DP/PT/radial pulses 2+ bilaterally MS- no significant deformity or atrophy Skin- warm and dry, no rash or lesion; ICD pocket well healed Psych- euthymic mood, full affect Neuro- strength and sensation are intact  ICD interrogation- reviewed in detail today,  See PACEART report  EKG:  EKG  03/22/15  SR rate 91 LBBB PVC;s   Recent Labs: 06/07/2015: TSH 1.124 07/21/2015: B Natriuretic Peptide 215.0* 01/19/2016: ALT 24; BUN 10; Creatinine, Ser 1.15; Hemoglobin 12.5*; Platelets 125*; Potassium 4.2; Sodium 143   Wt Readings from Last 3 Encounters:  01/24/16 98.431 kg (217 lb)  01/19/16 97.977 kg (216 lb)  01/04/16 97.251 kg (214 lb 6.4 oz)    Lab Results  Component Value Date   CREATININE 1.15 01/19/2016   BUN 10 01/19/2016   NA 143 01/19/2016   K 4.2 01/19/2016   CL 106 01/19/2016   CO2 30 01/19/2016     Other studies Reviewed: Additional studies/ records that were reviewed today include: Dr Jackalyn Lombard office notes, hospital records  Assessment and Plan:  1.  Persistent atrial fibrillation He was admitted June 2016  with acute on chronic heart failure in the setting of AF with RVR.  He underwent TEE/DCCV and reports significant symptomatic improvement in symptoms.  Continue Xarelto for CHADS2VASC of 4 Continue amoidarone 300 mg daily   2.  Chronic systolic dysfunction euvolemic today Back on  Losartan  Entresto stopped by Dr Aundra Dubin for low BP  F/U ICM clinic aldactone added see labs above Echo EF normalized on meds CRT interrogation with normal impedence  3.  HTN Well controlled.  Continue current medications and low sodium Dash type diet.     4.  Neuropathic pain He has been  maintained on Neurontin as prescribed by Dr Edrick Oh with resolution of pain.  He asks today about discontinuing  this medication.  Advised to follow up with PCP regarding recommendations   5. OSA:  Continue CPAP f/u Turner   Current medicines are reviewed at length with the patient today.       Disposition:   Follow up with me in 6 months  FU with CHF clinic 3 months  Echo prior to CHF appt    [name]

## 2016-01-24 ENCOUNTER — Ambulatory Visit (INDEPENDENT_AMBULATORY_CARE_PROVIDER_SITE_OTHER): Payer: Medicare Other | Admitting: Cardiovascular Disease

## 2016-01-24 ENCOUNTER — Encounter: Payer: Self-pay | Admitting: Cardiovascular Disease

## 2016-01-24 VITALS — BP 124/64 | HR 67 | Ht 66.0 in | Wt 217.0 lb

## 2016-01-24 DIAGNOSIS — I10 Essential (primary) hypertension: Secondary | ICD-10-CM

## 2016-01-24 DIAGNOSIS — I5022 Chronic systolic (congestive) heart failure: Secondary | ICD-10-CM | POA: Diagnosis not present

## 2016-01-24 NOTE — Patient Instructions (Addendum)
Medication Instructions:  Your physician recommends that you continue on your current medications as directed. Please refer to the Current Medication list given to you today.  Labwork: NONE  Testing/Procedures: NONE  Follow-Up: Your physician recommends that you schedule a follow-up appointment in: 3 months with Heart Failure Clinic.  Your physician wants you to follow-up in: 6 months with Dr. Johnsie Cancel. You will receive a reminder letter in the mail two months in advance. If you don't receive a letter, please call our office to schedule the follow-up appointment.  If you need a refill on your cardiac medications before your next appointment, please call your pharmacy.

## 2016-02-07 ENCOUNTER — Ambulatory Visit (INDEPENDENT_AMBULATORY_CARE_PROVIDER_SITE_OTHER): Payer: Medicare Other

## 2016-02-07 DIAGNOSIS — Z95810 Presence of automatic (implantable) cardiac defibrillator: Secondary | ICD-10-CM

## 2016-02-07 DIAGNOSIS — I5022 Chronic systolic (congestive) heart failure: Secondary | ICD-10-CM

## 2016-02-08 ENCOUNTER — Telehealth: Payer: Self-pay | Admitting: Internal Medicine

## 2016-02-08 NOTE — Telephone Encounter (Signed)
Follow Up: ° ° ° °Returning your call from this morning. °

## 2016-02-08 NOTE — Telephone Encounter (Signed)
Attempted call to patient

## 2016-02-08 NOTE — Progress Notes (Signed)
EPIC Encounter for ICM Monitoring  Patient Name: Chad Brown is a 75 y.o. male Date: 02/08/2016 Primary Care Physican: Sherrie Mustache, MD Primary Cardiologist: Johnsie Cancel Electrophysiologist: Allred Dry Weight: 210 lbs    Bi-V Pacing 98.1%      In the past month, have you:  1. Gained more than 2 pounds in a day or more than 5 pounds in a week? no  2. Had changes in your medications (with verification of current medications)? no  3. Had more shortness of breath than is usual for you? no  4. Limited your activity because of shortness of breath? no  5. Not been able to sleep because of shortness of breath? no  6. Had increased swelling in your feet, ankles, legs or stomach area? no  7. Had symptoms of dehydration (dizziness, dry mouth, increased thirst, decreased urine output) no  8. Had changes in sodium restriction? no  9. Been compliant with medication? Yes  ICM trend: 3 month view for 02/07/2016   ICM trend: 1 year view for 02/07/2016   Follow-up plan: ICM clinic phone appointment 03/14/2016.    FLUID LEVELS: Optivol thoracic impedance slightly decreased 02/01/2016 to 02/08/2016 suggesting fluid accumulation but trending back toward baseline today.  He reported eating [place] food on 02/06/2016.    SYMPTOMS:  None.  Denied any symptoms such as weight gain, SOB and/or lower extremity swelling. Encouraged to call for any fluid symptoms.   EDUCATION:  [employer] is high sodium and to limit intake to < 2000 mg and fluid intake to 64 oz daily.   No changes today.     Rosalene Billings, RN, CCM 02/08/2016 11:06 AM

## 2016-02-09 NOTE — Telephone Encounter (Signed)
Spoke with patient.

## 2016-03-05 ENCOUNTER — Telehealth (HOSPITAL_COMMUNITY): Payer: Self-pay | Admitting: *Deleted

## 2016-03-14 ENCOUNTER — Telehealth: Payer: Self-pay | Admitting: Cardiology

## 2016-03-14 NOTE — Telephone Encounter (Signed)
LMOVM reminding pt to send remote transmission.   

## 2016-03-15 NOTE — Progress Notes (Signed)
No ICM transmission received 03/14/2016.  Next ICM remote transmission scheduled 04/19/2016.

## 2016-03-19 ENCOUNTER — Encounter (HOSPITAL_COMMUNITY): Payer: Medicare Other | Attending: Hematology & Oncology

## 2016-03-19 DIAGNOSIS — D649 Anemia, unspecified: Secondary | ICD-10-CM | POA: Diagnosis present

## 2016-03-19 DIAGNOSIS — D696 Thrombocytopenia, unspecified: Secondary | ICD-10-CM

## 2016-03-19 DIAGNOSIS — C439 Malignant melanoma of skin, unspecified: Secondary | ICD-10-CM | POA: Insufficient documentation

## 2016-03-19 LAB — CBC WITH DIFFERENTIAL/PLATELET
BASOS ABS: 0 10*3/uL (ref 0.0–0.1)
BASOS PCT: 0 %
Eosinophils Absolute: 0.1 10*3/uL (ref 0.0–0.7)
Eosinophils Relative: 1 %
HCT: 35.2 % — ABNORMAL LOW (ref 39.0–52.0)
Hemoglobin: 11.6 g/dL — ABNORMAL LOW (ref 13.0–17.0)
LYMPHS PCT: 18 %
Lymphs Abs: 1 10*3/uL (ref 0.7–4.0)
MCH: 31.8 pg (ref 26.0–34.0)
MCHC: 33 g/dL (ref 30.0–36.0)
MCV: 96.4 fL (ref 78.0–100.0)
Monocytes Absolute: 0.3 10*3/uL (ref 0.1–1.0)
Monocytes Relative: 6 %
NEUTROS ABS: 4.4 10*3/uL (ref 1.7–7.7)
Neutrophils Relative %: 75 %
PLATELETS: 186 10*3/uL (ref 150–400)
RBC: 3.65 MIL/uL — AB (ref 4.22–5.81)
RDW: 14.8 % (ref 11.5–15.5)
WBC: 5.8 10*3/uL (ref 4.0–10.5)

## 2016-03-19 LAB — IRON AND TIBC
IRON: 73 ug/dL (ref 45–182)
SATURATION RATIOS: 24 % (ref 17.9–39.5)
TIBC: 308 ug/dL (ref 250–450)
UIBC: 235 ug/dL

## 2016-03-19 LAB — COMPREHENSIVE METABOLIC PANEL
ALT: 27 U/L (ref 17–63)
AST: 22 U/L (ref 15–41)
Albumin: 3.5 g/dL (ref 3.5–5.0)
Alkaline Phosphatase: 113 U/L (ref 38–126)
BILIRUBIN TOTAL: 0.6 mg/dL (ref 0.3–1.2)
BUN: 15 mg/dL (ref 6–20)
CALCIUM: 8.1 mg/dL — AB (ref 8.9–10.3)
CHLORIDE: 106 mmol/L (ref 101–111)
CO2: 29 mmol/L (ref 22–32)
CREATININE: 1.09 mg/dL (ref 0.61–1.24)
Glucose, Bld: 142 mg/dL — ABNORMAL HIGH (ref 65–99)
Potassium: 4.6 mmol/L (ref 3.5–5.1)
Sodium: 137 mmol/L (ref 135–145)
Total Protein: 6.7 g/dL (ref 6.5–8.1)

## 2016-03-19 LAB — FERRITIN: FERRITIN: 106 ng/mL (ref 24–336)

## 2016-03-20 ENCOUNTER — Other Ambulatory Visit (HOSPITAL_COMMUNITY): Payer: Medicare Other

## 2016-03-27 ENCOUNTER — Other Ambulatory Visit: Payer: Self-pay

## 2016-03-27 ENCOUNTER — Other Ambulatory Visit: Payer: Self-pay | Admitting: Internal Medicine

## 2016-03-27 MED ORDER — RIVAROXABAN 20 MG PO TABS: 20.0000 mg | ORAL_TABLET | Freq: Every day | ORAL | Status: DC

## 2016-04-19 ENCOUNTER — Ambulatory Visit (INDEPENDENT_AMBULATORY_CARE_PROVIDER_SITE_OTHER): Payer: Medicare Other

## 2016-04-19 ENCOUNTER — Telehealth: Payer: Self-pay

## 2016-04-19 DIAGNOSIS — I5022 Chronic systolic (congestive) heart failure: Secondary | ICD-10-CM | POA: Diagnosis not present

## 2016-04-19 DIAGNOSIS — Z95810 Presence of automatic (implantable) cardiac defibrillator: Secondary | ICD-10-CM

## 2016-04-19 NOTE — Telephone Encounter (Signed)
Attempted ICM call to patient and left message requesting to send manual transmission today.

## 2016-04-19 NOTE — Progress Notes (Signed)
EPIC Encounter for ICM Monitoring  Patient Name: Chad Brown is a 75 y.o. male Date: 04/19/2016 Primary Care Physican: Sherrie Mustache, MD Primary Cardiologist: Johnsie Cancel Electrophysiologist: Allred Dry Weight: 216.2 lb  Bi-V Pacing:  98.1%       Heart Failure questions reviewed, pt symptomatic with stomach bloating and weight gain of 3 lbs.  His highest weight within last week was 219 lbs.  Thoracic impedence abnormal suggesting fluid accumulation 04/05/2016 to 04/14/2016 and then returned to normal on 04/15/2016.   Patient reported taking extra Furosemide a few days ago due to symptoms.   Recommendations: No changes.  Low sodium diet education provided.    ICM trend: 04/19/2016    Follow-up plan: ICM clinic phone appointment on 05/22/2016.  Copy of ICM check sent to primary cardiologist and device physician.   Rosalene Billings, RN 04/19/2016 12:27 PM

## 2016-05-14 ENCOUNTER — Other Ambulatory Visit: Payer: Self-pay | Admitting: Cardiology

## 2016-05-21 ENCOUNTER — Encounter (HOSPITAL_COMMUNITY): Payer: Medicare Other | Attending: Hematology & Oncology

## 2016-05-21 DIAGNOSIS — C439 Malignant melanoma of skin, unspecified: Secondary | ICD-10-CM | POA: Diagnosis not present

## 2016-05-21 DIAGNOSIS — D649 Anemia, unspecified: Secondary | ICD-10-CM | POA: Diagnosis not present

## 2016-05-21 DIAGNOSIS — D696 Thrombocytopenia, unspecified: Secondary | ICD-10-CM

## 2016-05-21 LAB — COMPREHENSIVE METABOLIC PANEL
ALBUMIN: 3.7 g/dL (ref 3.5–5.0)
ALK PHOS: 122 U/L (ref 38–126)
ALT: 19 U/L (ref 17–63)
AST: 20 U/L (ref 15–41)
Anion gap: 6 (ref 5–15)
BILIRUBIN TOTAL: 0.4 mg/dL (ref 0.3–1.2)
BUN: 16 mg/dL (ref 6–20)
CALCIUM: 8.2 mg/dL — AB (ref 8.9–10.3)
CO2: 29 mmol/L (ref 22–32)
Chloride: 103 mmol/L (ref 101–111)
Creatinine, Ser: 1.24 mg/dL (ref 0.61–1.24)
GFR calc Af Amer: >60 mL/min (ref >60)
GFR, EST NON AFRICAN AMERICAN: 55 mL/min — AB (ref >60)
GLUCOSE: 169 mg/dL — AB (ref 65–99)
POTASSIUM: 4.2 mmol/L (ref 3.5–5.1)
Sodium: 138 mmol/L (ref 135–145)
TOTAL PROTEIN: 6.9 g/dL (ref 6.5–8.1)

## 2016-05-21 LAB — CBC WITH DIFFERENTIAL/PLATELET
Basophils Absolute: 0 10*3/uL (ref 0.0–0.1)
Basophils Relative: 1 %
EOS PCT: 3 %
Eosinophils Absolute: 0.2 10*3/uL (ref 0.0–0.7)
HEMATOCRIT: 36.3 % — AB (ref 39.0–52.0)
HEMOGLOBIN: 12 g/dL — AB (ref 13.0–17.0)
LYMPHS PCT: 20 %
Lymphs Abs: 1 10*3/uL (ref 0.7–4.0)
MCH: 31.3 pg (ref 26.0–34.0)
MCHC: 33.1 g/dL (ref 30.0–36.0)
MCV: 94.5 fL (ref 78.0–100.0)
MONO ABS: 0.3 10*3/uL (ref 0.1–1.0)
MONOS PCT: 6 %
NEUTROS ABS: 3.7 10*3/uL (ref 1.7–7.7)
Neutrophils Relative %: 70 %
Platelets: 136 10*3/uL — ABNORMAL LOW (ref 150–400)
RBC: 3.84 MIL/uL — ABNORMAL LOW (ref 4.22–5.81)
RDW: 14.8 % (ref 11.5–15.5)
WBC: 5.2 10*3/uL (ref 4.0–10.5)

## 2016-05-21 LAB — IRON AND TIBC
IRON: 64 ug/dL (ref 45–182)
Saturation Ratios: 20 % (ref 17.9–39.5)
TIBC: 312 ug/dL (ref 250–450)
UIBC: 248 ug/dL

## 2016-05-21 LAB — FERRITIN: Ferritin: 54 ng/mL (ref 24–336)

## 2016-05-22 ENCOUNTER — Other Ambulatory Visit (HOSPITAL_COMMUNITY): Payer: Medicare Other

## 2016-05-22 ENCOUNTER — Telehealth: Payer: Self-pay | Admitting: Cardiology

## 2016-05-22 ENCOUNTER — Ambulatory Visit (INDEPENDENT_AMBULATORY_CARE_PROVIDER_SITE_OTHER): Payer: Medicare Other

## 2016-05-22 ENCOUNTER — Telehealth: Payer: Self-pay

## 2016-05-22 DIAGNOSIS — Z95810 Presence of automatic (implantable) cardiac defibrillator: Secondary | ICD-10-CM

## 2016-05-22 DIAGNOSIS — I5022 Chronic systolic (congestive) heart failure: Secondary | ICD-10-CM

## 2016-05-22 NOTE — Telephone Encounter (Signed)
Remote ICM transmission received.  Attempted patient call and left message for return call.   

## 2016-05-22 NOTE — Telephone Encounter (Signed)
LMOVM reminding pt to send remote transmission.   

## 2016-05-22 NOTE — Progress Notes (Signed)
EPIC Encounter for ICM Monitoring  Patient Name: Chad Brown is a 75 y.o. male Date: 05/22/2016 Primary Care Physican: Sherrie Mustache, MD Primary Cardiologist: Johnsie Cancel Electrophysiologist: Allred Dry Weight:  unknown Bi-V Pacing:  98.1%       Attempted ICM call and unable to reach.  Transmission reviewed.   Thoracic impedance normal.  Follow-up plan: ICM clinic phone appointment on 06/22/2016.  Copy of ICM check sent to device physician.   ICM trend: 05/22/2016       Rosalene Billings, RN 05/22/2016 1:36 PM

## 2016-05-25 ENCOUNTER — Other Ambulatory Visit (HOSPITAL_COMMUNITY): Payer: Self-pay | Admitting: Hematology & Oncology

## 2016-05-25 DIAGNOSIS — D509 Iron deficiency anemia, unspecified: Secondary | ICD-10-CM | POA: Insufficient documentation

## 2016-06-04 ENCOUNTER — Other Ambulatory Visit (HOSPITAL_COMMUNITY): Payer: Self-pay | Admitting: Hematology & Oncology

## 2016-06-06 ENCOUNTER — Ambulatory Visit (HOSPITAL_COMMUNITY): Payer: Medicare Other

## 2016-06-06 ENCOUNTER — Encounter (HOSPITAL_COMMUNITY): Payer: Medicare Other | Attending: Hematology & Oncology

## 2016-06-06 VITALS — BP 140/65 | HR 60 | Temp 97.7°F | Resp 18 | Wt 225.5 lb

## 2016-06-06 DIAGNOSIS — R195 Other fecal abnormalities: Secondary | ICD-10-CM

## 2016-06-06 DIAGNOSIS — D509 Iron deficiency anemia, unspecified: Secondary | ICD-10-CM | POA: Diagnosis not present

## 2016-06-06 DIAGNOSIS — C439 Malignant melanoma of skin, unspecified: Secondary | ICD-10-CM | POA: Insufficient documentation

## 2016-06-06 DIAGNOSIS — D649 Anemia, unspecified: Secondary | ICD-10-CM | POA: Insufficient documentation

## 2016-06-06 MED ORDER — SODIUM CHLORIDE 0.9 % IV SOLN
510.0000 mg | Freq: Once | INTRAVENOUS | Status: AC
  Administered 2016-06-06: 510 mg via INTRAVENOUS
  Filled 2016-06-06: qty 17

## 2016-06-06 NOTE — Patient Instructions (Signed)
[facility] Discharge Instructions  RECOMMENDATIONS MADE BY THE CONSULTANT AND ANY TEST RESULTS WILL BE SENT TO YOUR REFERRING PHYSICIAN.  Received Feraheme today. Follow-up as scheduled. Call clinic for any questions or concerns  Thank you for choosing [facility] at [facility] to provide your oncology and hematology care.  To afford each patient quality time with our provider, please arrive at least 15 minutes before your scheduled appointment time.   Beginning January 23rd 2017 lab work for the Cancer Center will be done in the  Main lab at [facility] on 1st floor. If you have a lab appointment with the Cancer Center please come in thru the  Main Entrance and check in at the main information desk  You need to re-schedule your appointment should you arrive 10 or more minutes late.  We strive to give you quality time with our providers, and arriving late affects you and other patients whose appointments are after yours.  Also, if you no show three or more times for appointments you may be dismissed from the clinic at the providers discretion.     Again, thank you for choosing [facility].  Our hope is that these requests will decrease the amount of time that you wait before being seen by our physicians.       _____________________________________________________________  Should you have questions after your visit to [facility], please contact our office at [phone] between the hours of 8:30 a.m. and 4:30 p.m.  Voicemails left after 4:30 p.m. will not be returned until the following business day.  For prescription refill requests, have your pharmacy contact our office.         Resources For Cancer Patients and their Caregivers ? American Cancer Society: Can assist with transportation, wigs, general needs, runs Look Good Feel Better.        1-888-227-6333 ? Cancer Care: Provides financial  assistance, online support groups, medication/co-pay assistance.  1-800-813-HOPE (4673) ? [facility] Assists [place] cancer patients and their families through emotional , educational and financial support.  336-427-4357 ? [place] DSS Where to apply for food stamps, Medicaid and utility assistance. 336-342-1394 ? [facility]: Transportation to medical appointments. 336-347-2287 ? Social Security Administration: May apply for disability if have a Stage IV cancer. 336-342-7796 1-800-772-1213 ? [place] Aging, Disability and Transit Services: Assists with nutrition, care and transit needs. 336-349-2343  Cancer Center Support Programs: @10RELATIVEDAYS@ > Cancer Support Group  2nd Tuesday of the month 1pm-2pm, Journey Room  > Creative Journey  3rd Tuesday of the month 1130am-1pm, Journey Room  > Look Good Feel Better  1st Wednesday of the month 10am-12 noon, Journey Room (Call American Cancer Society to register 1-800-395-5775)   

## 2016-06-06 NOTE — Progress Notes (Signed)
Chad Brown tolerated Feraheme well without issues.VSS upon discharge. Pt discharged self ambulatory in satisfactory condition with wife

## 2016-06-11 ENCOUNTER — Other Ambulatory Visit: Payer: Self-pay

## 2016-06-11 ENCOUNTER — Telehealth: Payer: Self-pay

## 2016-06-11 ENCOUNTER — Ambulatory Visit (INDEPENDENT_AMBULATORY_CARE_PROVIDER_SITE_OTHER): Payer: Medicare Other | Admitting: Internal Medicine

## 2016-06-11 ENCOUNTER — Encounter: Payer: Self-pay | Admitting: Internal Medicine

## 2016-06-11 VITALS — BP 132/70 | HR 64 | Ht 64.75 in | Wt 224.4 lb

## 2016-06-11 DIAGNOSIS — D509 Iron deficiency anemia, unspecified: Secondary | ICD-10-CM

## 2016-06-11 DIAGNOSIS — K862 Cyst of pancreas: Secondary | ICD-10-CM

## 2016-06-11 DIAGNOSIS — R195 Other fecal abnormalities: Secondary | ICD-10-CM | POA: Diagnosis not present

## 2016-06-11 DIAGNOSIS — D120 Benign neoplasm of cecum: Secondary | ICD-10-CM | POA: Diagnosis not present

## 2016-06-11 DIAGNOSIS — K591 Functional diarrhea: Secondary | ICD-10-CM | POA: Diagnosis not present

## 2016-06-11 MED ORDER — CARVEDILOL 12.5 MG PO TABS: 12.5000 mg | ORAL_TABLET | Freq: Two times a day (BID) | ORAL | 3 refills | Status: DC

## 2016-06-11 NOTE — Telephone Encounter (Signed)
Patient informed to hold xarelto 2 days prior to colon and he understood.

## 2016-06-11 NOTE — Telephone Encounter (Signed)
[facility] [address] [place] [address]   06/11/2016   RE: RENLY ROOTS DOB: 01/27/1941 MRN: [id number]   Dear [name] MD,    We have scheduled the above patient for an endoscopic procedure. Our records show that he is on anticoagulation therapy.   Please advise as to how long the patient may come off his therapy of xarelto prior to the colonoscopy with APC procedure, which is scheduled for 08/07/16.  Please fax back/ or route the completed form to Emad Brechtel [place], [place] at [phone].   Sincerely,   Silvano Rusk, MD

## 2016-06-11 NOTE — Telephone Encounter (Signed)
Dr Johnsie Cancel answered back ok to hold 2 days prior to procedure.  Left message for patient to call me back.

## 2016-06-11 NOTE — Patient Instructions (Signed)
  You have been scheduled for a colonoscopy. Please follow written instructions given to you at your visit today.  Please pick up your prep supplies at the pharmacy.  If you use inhalers (even only as needed), please bring them with you on the day of your procedure.   You will be contaced by our office prior to your procedure for directions on holding your xarelto.  If you do not hear from our office 1 week prior to your scheduled procedure, please call [phone] to discuss.   Stop your creon and continue your Imodium.      I appreciate the opportunity to care for you. Silvano Rusk, MD, [facility]

## 2016-06-11 NOTE — Progress Notes (Signed)
Chad Brown 75 y.o. 10/09/1940 [id number]  Assessment & Plan:   1. Benign neoplasm of cecum   2. Anemia, iron deficiency   3. Heme + stool   4. Functional diarrhea - IBS suspected   5. Pancreatic cyst   Xarelto therapy Atrial fibrillation AICD in place  He had a large cecal adenoma, I'm not sure it's completely removed, a close follow-up colonoscopy is sensible. Would hold his Xarelto 2 days before as long as there is no disagreement from Dr. Johnsie Cancel his cardiologist. I have reviewed the typical risks of colonoscopy and include bleeding infection reaction to medication puncture perforation possible need for surgery. I've also reviewed the extra rare but possible risk of a stroke off his Xarelto. Colonoscopy will performed at the hospital or have argon plasma coagulation for ablation therapy available.  Regarding his diarrhea, it sounds most like irritable bowel syndrome. He can discontinue the daily Creon doesn't seem to be helping, the other option would be to go regularly with that but his intermittent loperamide usage seems to control things nicely so I have advised him to continue that.  The pancreatic cyst lesion seems to be benign and stable. I'm not sure he needs repeat imaging, and if so it might need to be MRI but another consideration would be endoscopic ultrasound. Based upon my understanding of current expert guidelines for management of pancreatic cysts, I think the options would be repeating an MRI 2 years from last imaging with MR, or possibly doing an EUS which would obviate the need for further follow-up.    Subjective:   Chief Complaint: Follow-up of anemia and colon polyp he is also complaining of diarrhea  HPI Patient is an elderly white man known to me with prior large cecal adenoma and other colon polyps found at colonoscopy May 2016, iron deficiency anemia thought secondary to chronic blood loss perhaps with a small bowel enteroscopy in November 2016 without  any significant findings. In the interim he has been maintained on iron infusions or parenteral iron through Dr. Whitney Muse, his hemoglobin is near normal or normal and his ferritin is in normal range now. He has continued to have a heme positive stool at times he says but he does not see any bleeding. Symptomatically 2 times a week or so he'll have 2 or 3 diarrheal stools, loose with some mucus which are unpredictable. He has a 1.5 cm head of the pancreas cyst, that has been stable over several years, but because of that I had tried him on Creon previously. Last year when I had seen him he seemed to be improved using that intermittently but today he complains of still having this episodic diarrhea. He's taking Creon 1 each morning. He tells me that if he uses loperamide at night before he asked his to something the next day he'll have good control of his stools for a couple of days without significant pain constipation symptoms etc. He says she's never been regular, and will move his bowels intermittently but does not have distress. He cannot correlate the episodic diarrhea with any meals or other activity or medications. His stools are watery or formed they do not sound greasy or oily.  Allergies  Allergen Reactions  . Pravastatin Sodium Other (See Comments)    Joint and muscle pain  . Zithromax [Azithromycin] Diarrhea   Outpatient Medications Prior to Visit  Medication Sig Dispense Refill  . acetaminophen (TYLENOL) 500 MG tablet Take 1,000 mg by mouth every 4 (four) hours  as needed for moderate pain or headache.    [name] amiodarone (PACERONE) 200 MG tablet Take 1 tablet (200 mg total) by mouth daily. 30 tablet 11  . carvedilol (COREG) 12.5 MG tablet Take 1 tablet (12.5 mg total) by mouth 2 (two) times daily with a meal. 60 tablet 6  . dorzolamide-timolol (COSOPT) 22.3-6.8 MG/ML ophthalmic solution Place 1 drop into the right eye 2 (two) times daily.    [name] escitalopram (LEXAPRO) 20 MG tablet Take 1 tablet  (20 mg total) by mouth daily. 90 tablet 1  . ezetimibe (ZETIA) 10 MG tablet Take 1 tablet (10 mg total) by mouth daily. 90 tablet 3  . finasteride (PROSCAR) 5 MG tablet Take 5 mg by mouth every morning.     . furosemide (LASIX) 20 MG tablet Take 2 tablets (40 mg total) by mouth daily. 180 tablet 3  . gabapentin (NEURONTIN) 100 MG capsule Take by mouth. Take 1 in the morning and 2 at night    . lipase/protease/amylase (CREON) 36000 UNITS CPEP capsule Take 36,000 Units by mouth as directed.    [name] losartan (COZAAR) 50 MG tablet Take 25-50 mg by mouth as directed.    [name] NIASPAN 500 MG CR tablet Take 1,000 mg by mouth at bedtime.     [name] Glycol-Propyl Glycol (SYSTANE ULTRA OP) Place 1 drop into the left eye 2 (two) times daily.     . potassium chloride SA (K-DUR,KLOR-CON) 20 MEQ tablet Take 1 tablet (20 mEq total) by mouth 2 (two) times daily. 135 tablet 3  . rivaroxaban (XARELTO) 20 MG TABS tablet Take 1 tablet (20 mg total) by mouth daily with supper. 90 tablet 3  . spironolactone (ALDACTONE) 25 MG tablet Take 0.5 tablets (12.5 mg total) by mouth daily. 45 tablet 3  . tamsulosin (FLOMAX) 0.4 MG CAPS Take 0.4 mg by mouth at bedtime.      No facility-administered medications prior to visit.    Past Medical History:  Diagnosis Date  . Adenomatous colon polyp    tubular  . AICD (automatic cardioverter/defibrillator) present 03/08/2015   MDT CRTD   . Anemia    iron deficient  . Arthritis    "about all my joints; hands, knees, back" (03/08/2015)  . Atherosclerosis   . Cholelithiasis   . Chronic systolic CHF (congestive heart failure) ([place])    a. New dx 12/2012 ? NICM, may be r/t afib. b. Nuc 03/2013 - normal;  c. 03/2015 TEE EF 15-20%.  . Chronic systolic CHF (congestive heart failure) ([place])   . Colon polyp, hyperplastic 5/16   removed precancerous lesions  . Diverticulosis   . Hyperlipidemia   . Hypertension   . Melanoma of eye ([place]) 2000's   "right; it's never been biopsied"  .  Melanoma of lower leg ([place]) 2015   "left; right at my knee"  . Myocardial infarction ([place]) 1998  . OSA (obstructive sleep apnea) 01/04/2016  . PAF (paroxysmal atrial fibrillation) ([place])    a. Dx 12/2012, s/p TEE/DCCV 01/26/13. b. On Xarelto (CHA2DS2VASc = 3);  c. 03/2015 TEE (EF 15-20%, no LAA thrombus) and DCCV - amio increased to 200 mg bid.  . Urinary hesitancy due to benign prostatic hypertrophy    Past Surgical History:  Procedure Laterality Date  . BACK SURGERY    . CARDIAC CATHETERIZATION  1998  . CARDIOVERSION N/A 01/26/2013   Procedure: CARDIOVERSION;  Surgeon: Lelon Perla, MD;  Location: [facility] ENDOSCOPY;  Service: Cardiovascular;  Laterality: N/A;  . CARDIOVERSION  N/A 03/23/2015   Procedure: CARDIOVERSION;  Surgeon: Jerline Pain, MD;  Location: [place];  Service: Cardiovascular;  Laterality: N/A;  . CATARACT EXTRACTION Right ~ 2006  . COLONOSCOPY WITH PROPOFOL N/A 02/10/2015   Procedure: COLONOSCOPY WITH PROPOFOL;  Surgeon: Gatha Mayer, MD;  Location: [facility] ENDOSCOPY;  Service: Endoscopy;  Laterality: N/A;  . ENTEROSCOPY N/A 08/17/2015   Procedure: ENTEROSCOPY;  Surgeon: Gatha Mayer, MD;  Location: [facility] ENDOSCOPY;  Service: Endoscopy;  Laterality: N/A;  . EP IMPLANTABLE DEVICE N/A 03/08/2015   MDT Hillery Aldo CRT-D for nonischemic CM by Dr Rayann Heman for primary prevention  . GLAUCOMA SURGERY Right ~ 2006   "put 3 stents in to drain fluid" (03/08/2015)  . INCISION AND DRAINAGE ABSCESS POSTERIOR CERVICALSPINE  05/2012  . JOINT REPLACEMENT    . MELANOMA EXCISION Left 2015   "lower leg; right at my knee"  . REFRACTIVE SURGERY Right ~ 2006 X 2   "twice; both done at [place]" (03/08/2015  . SURGERY SCROTAL / TESTICULAR Right 1990's  . TEE WITHOUT CARDIOVERSION N/A 01/26/2013   Procedure: TRANSESOPHAGEAL ECHOCARDIOGRAM (TEE);  Surgeon: Lelon Perla, MD;  Location: [place];  Service: Cardiovascular;  Laterality: N/A;  [name] anes. /   . TEE WITHOUT CARDIOVERSION N/A 10/05/2014    Procedure: TRANSESOPHAGEAL ECHOCARDIOGRAM (TEE)  with cardioversion;  Surgeon: Thayer Headings, MD;  Location: [facility] ENDOSCOPY;  Service: Cardiovascular;  Laterality: N/A;  12:52 synched cardioversion at 120 joules,...afib to SR...12 lead EKG ordered.Marland KitchenMarland KitchenCardiozem d/c'ed per MD verbal order at SR  . TEE WITHOUT CARDIOVERSION N/A 03/23/2015   Procedure: TRANSESOPHAGEAL ECHOCARDIOGRAM (TEE);  Surgeon: Jerline Pain, MD;  Location: [facility] END[facility]COPY;  Service: Cardiovascular;  Laterality: N/A;  . THORACIC SPINE SURGERY  03/2000   "ground calcium deposits from upper thoracic" (01/26/2013)  . TOTAL HIP ARTHROPLASTY Right 06/2007   Social History   Social History  . Marital status: Married    Spouse name: N/A  . Number of children: 3  . Years of education: N/A   Occupational History  . retired    Social History Main Topics  . Smoking status: Former Smoker    Packs/day: 3.00    Years: 48.00    Types: Cigarettes    Quit date: 09/28/2000  . Smokeless tobacco: Never Used  . Alcohol use No  . Drug use: No  . Sexual activity: Yes   Other Topics Concern  . None   Social History Narrative   Pt lives in [place] with spouse.  3 children are grown and healthy.   Retired.  Ran a country store for 30 years, previously worked in the [employer] for 16 years   Family History  Problem Relation Age of Onset  . Kidney disease Mother   . Heart disease Mother     MI, open heart  . Diabetes Mother     dialysis  . Leukemia Father   . Colon cancer Paternal Uncle   . Lung cancer Paternal Uncle     x 2  . Prostate cancer Paternal Uncle   . Diabetes Maternal Grandmother   . Heart attack Maternal Uncle   . Diabetes Maternal Aunt     x 3  . Diabetes Maternal Uncle        Review of Systems As per history of present illness. He does wear eyeglasses. Wt Readings from Last 3 Encounters:  06/11/16 224 lb 6 oz (101.8 kg)  06/06/16 225 lb 8 oz (102.3 kg)  01/24/16 217 lb (  98.4 kg)      Objective:   Physical Exam @BP  132/70 (BP Location: Left Arm, Patient Position: Sitting, Cuff Size: Normal)   Pulse 64   Ht 5' 4.75" (1.645 m)   Wt 224 lb 6 oz (101.8 kg)   BMI 37.63 kg/m @  General:  NAD Eyes:   anicteric Lungs:  clear Heart::  S1S2 no rubs, murmurs or gallops -  AICD in place in the left chest area Abdomen:  soft and nontender, BS+, protuberant obese with a diastases recti Ext:   no edema, cyanosis or clubbing    Data Reviewed:  As per history of present illness Lab Results  Component Value Date   WBC 5.2 05/21/2016   HGB 12.0 (L) 05/21/2016   HCT 36.3 (L) 05/21/2016   MCV 94.5 05/21/2016   PLT 136 (L) 05/21/2016   Lab Results  Component Value Date   FERRITIN 54 05/21/2016

## 2016-06-22 ENCOUNTER — Ambulatory Visit (INDEPENDENT_AMBULATORY_CARE_PROVIDER_SITE_OTHER): Payer: Medicare Other

## 2016-06-22 DIAGNOSIS — Z95810 Presence of automatic (implantable) cardiac defibrillator: Secondary | ICD-10-CM

## 2016-06-22 DIAGNOSIS — I5022 Chronic systolic (congestive) heart failure: Secondary | ICD-10-CM | POA: Diagnosis not present

## 2016-06-22 IMAGING — CR DG CHEST 2V
2 series · 2 of 2 positions shown · non-contrast
Comparison: Chest x-ray of October 02, 2014

CLINICAL DATA: Status post pacemaker placement

EXAM:
CHEST  2 VIEW

[w chest pa]
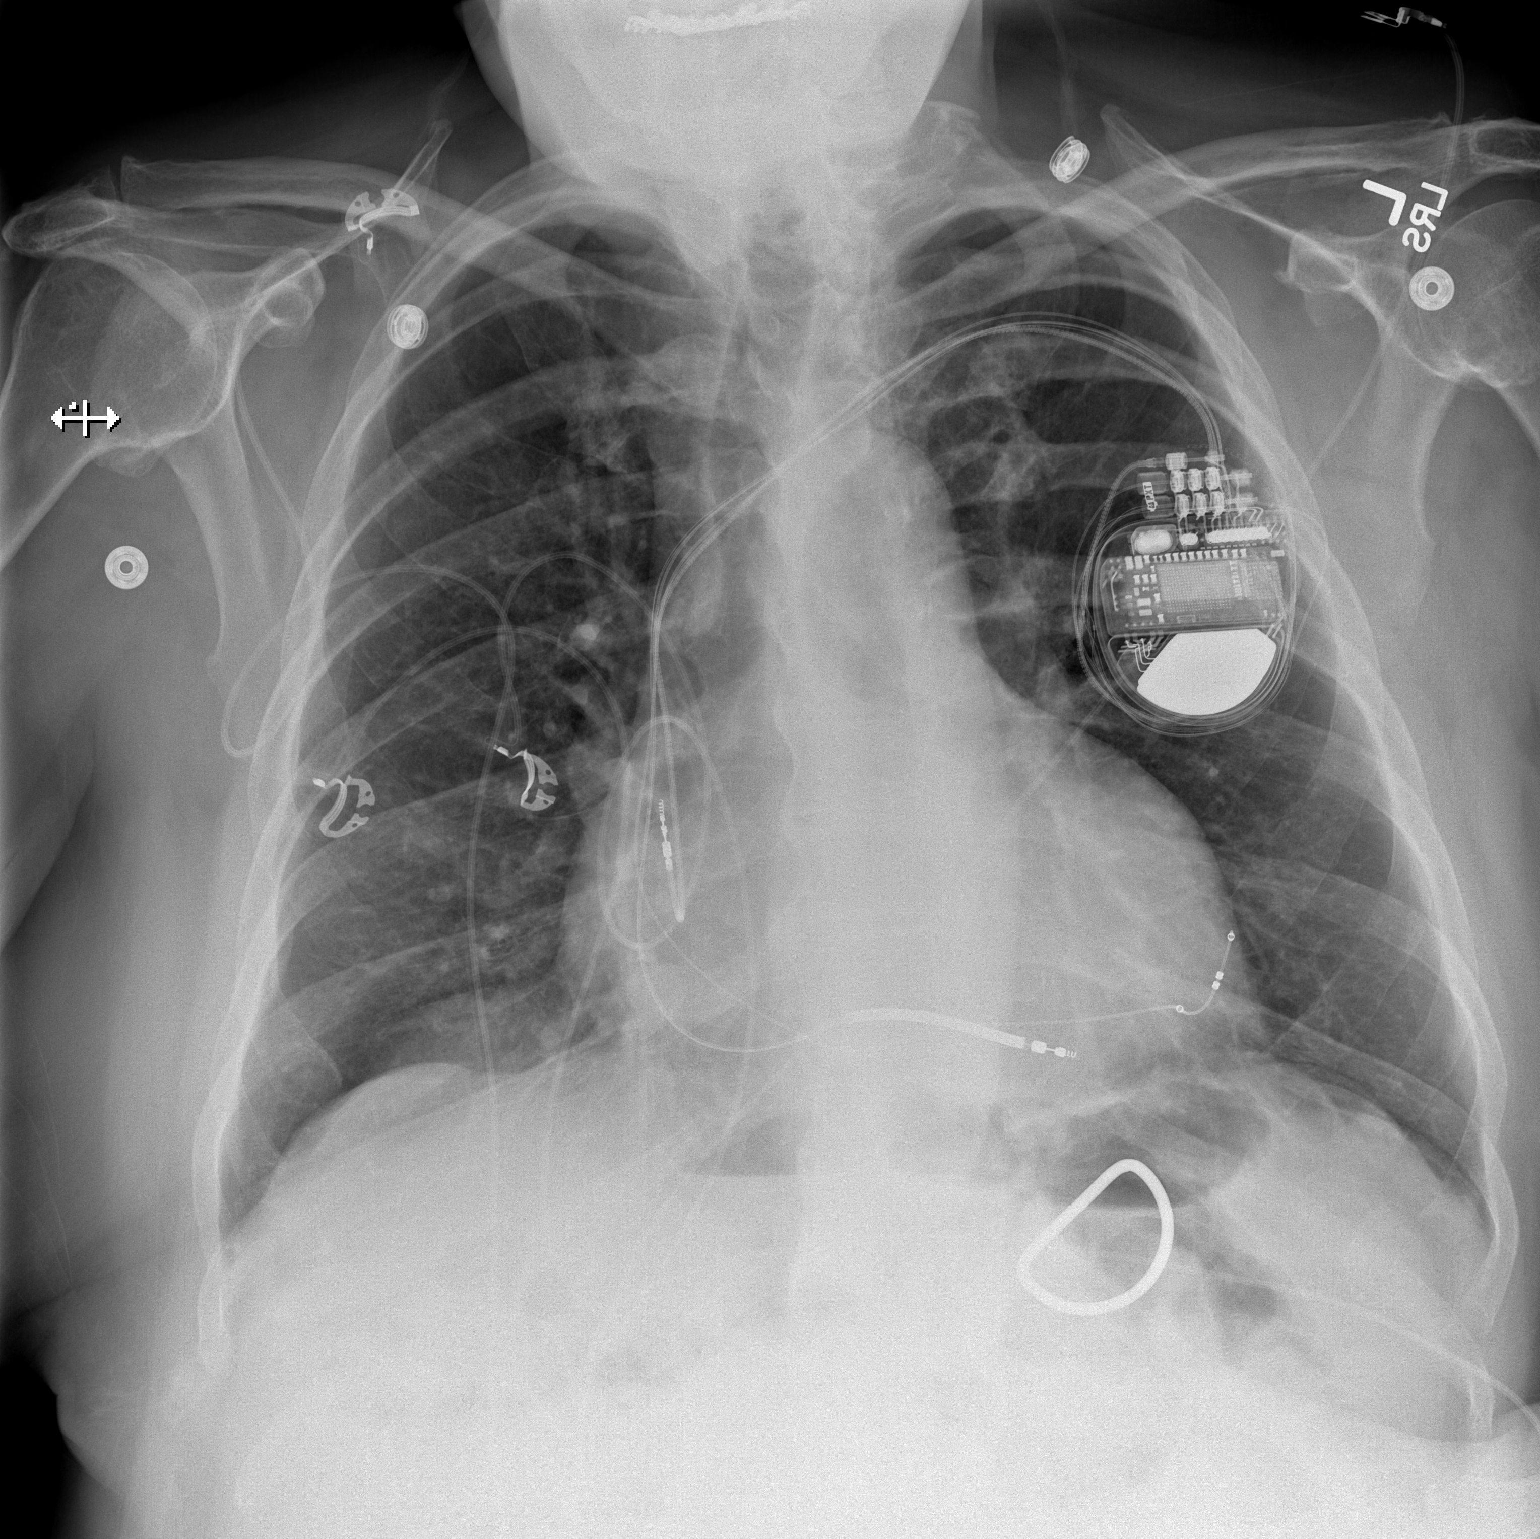

[w chest lat]
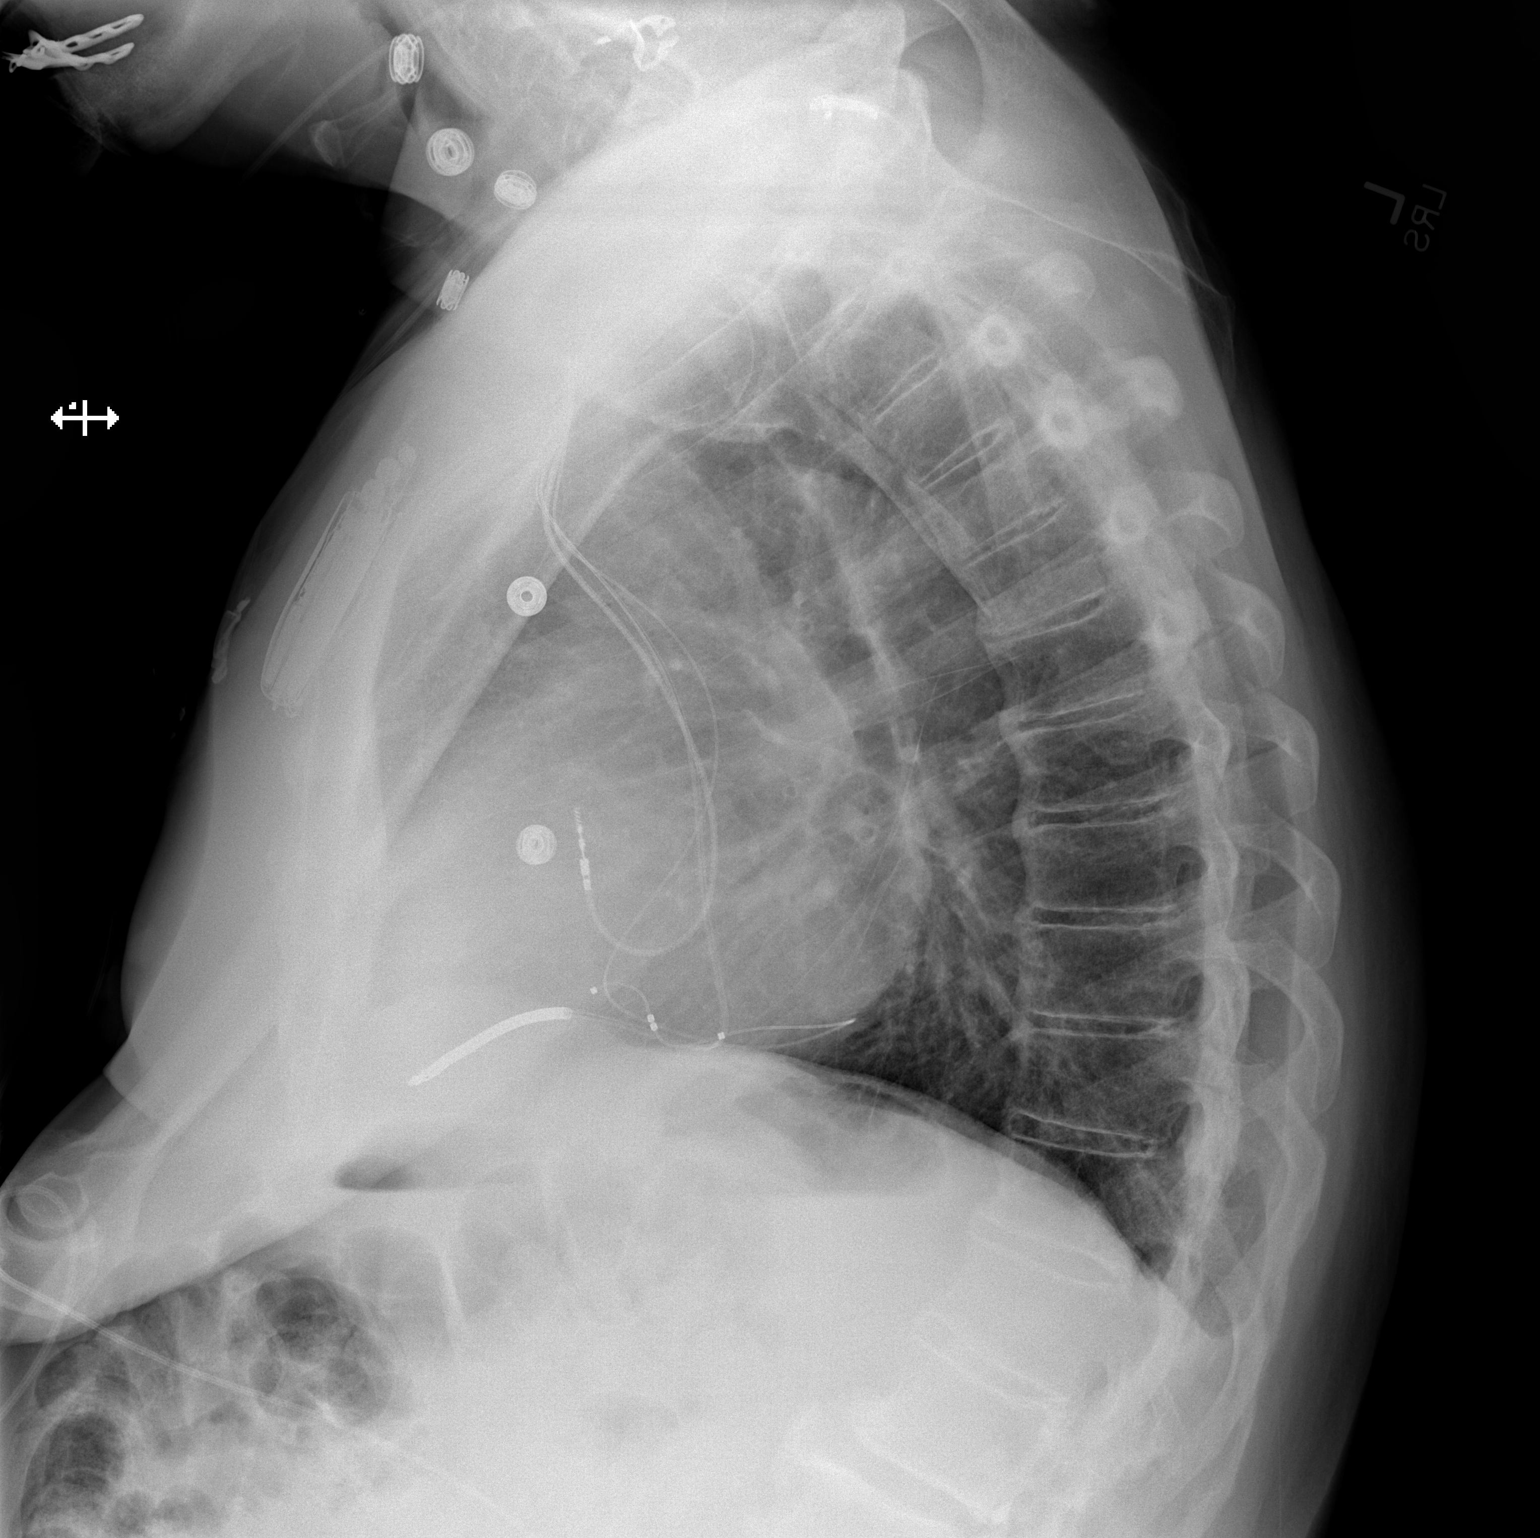

[2 of 2 positions shown; findings below may reference images not displayed]

FINDINGS: There has been interval placement of a permanent pacemaker
defibrillator. Positioning of the generator and electrodes is
radiographically good. The cardiac silhouette remains enlarged. The
pulmonary vascularity is normal. There is no pleural effusion. No in
infiltrate or atelectasis is observed. The bony thorax is
unremarkable.
IMPRESSION: There is no postprocedure complication following AICD placement.
Stable cardiomegaly.

## 2016-06-22 NOTE — Progress Notes (Signed)
EPIC Encounter for ICM Monitoring  Patient Name: Chad Brown is a 75 y.o. male Date: 06/22/2016 Primary Care Physican: Sherrie Mustache, MD Primary Cardiologist:Nishan Electrophysiologist: Allred Dry Weight:     unknown Bi-V Pacing:  98.1%         Attempted ICM call and unable to reach.  Transmission reviewed.   Thoracic impedance normal   Recommendations: No changes.  Low sodium diet education provided.    Follow-up plan: ICM clinic phone appointment on 07/23/2016.  Copy of ICM check sent to device physician.   ICM trend: 06/22/2016       Rosalene Billings, RN 06/22/2016 1:36 PM

## 2016-07-03 ENCOUNTER — Other Ambulatory Visit (HOSPITAL_COMMUNITY): Payer: Self-pay | Admitting: Oncology

## 2016-07-04 IMAGING — CR DG CHEST 2V
2 series · 2 of 2 positions shown · non-contrast
Comparison: Chest radiograph performed 03/09/2015

CLINICAL DATA: Acute onset of shortness of breath and throat
tightness. Dizziness. Initial encounter.

EXAM:
CHEST  2 VIEW

[chest pa]
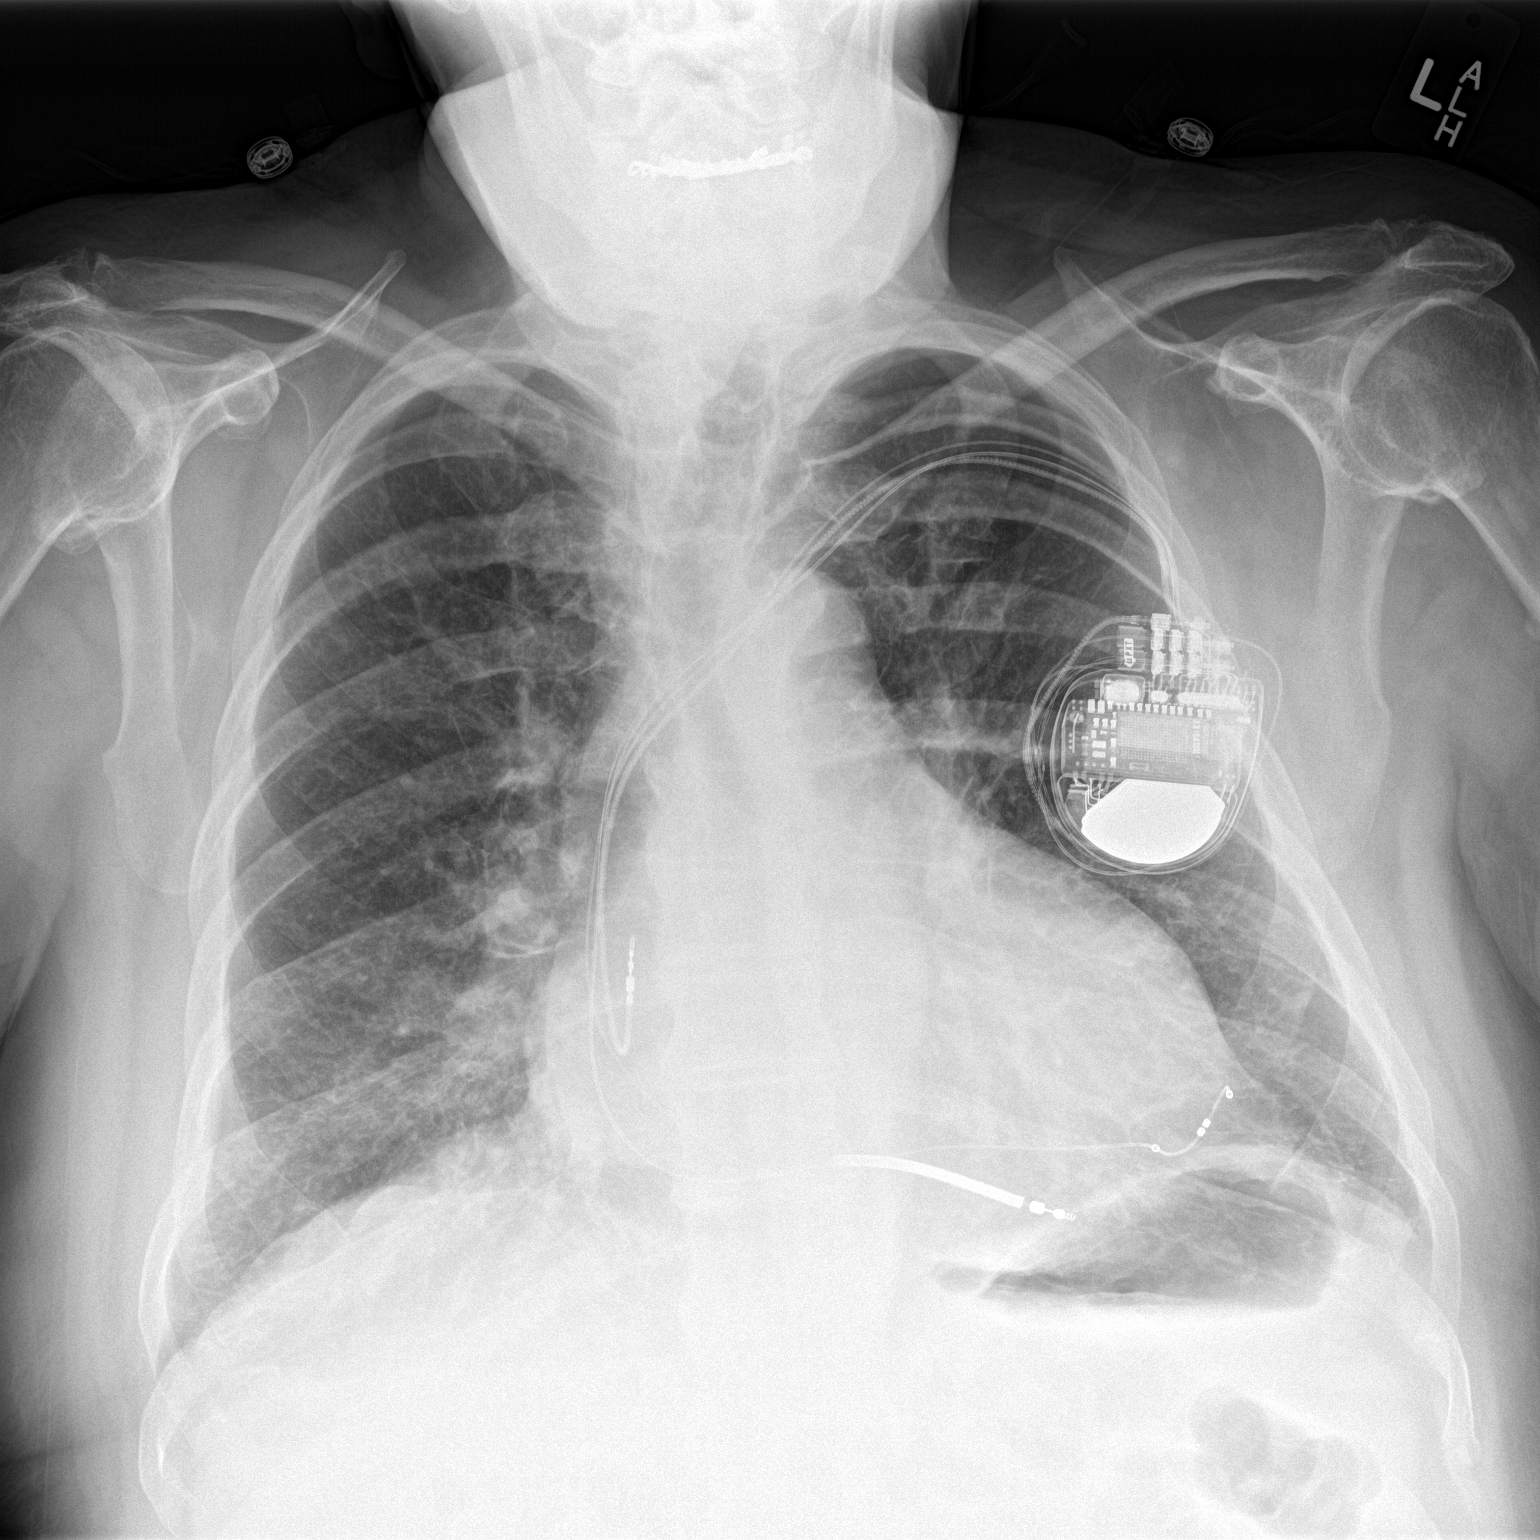

[chest lat]
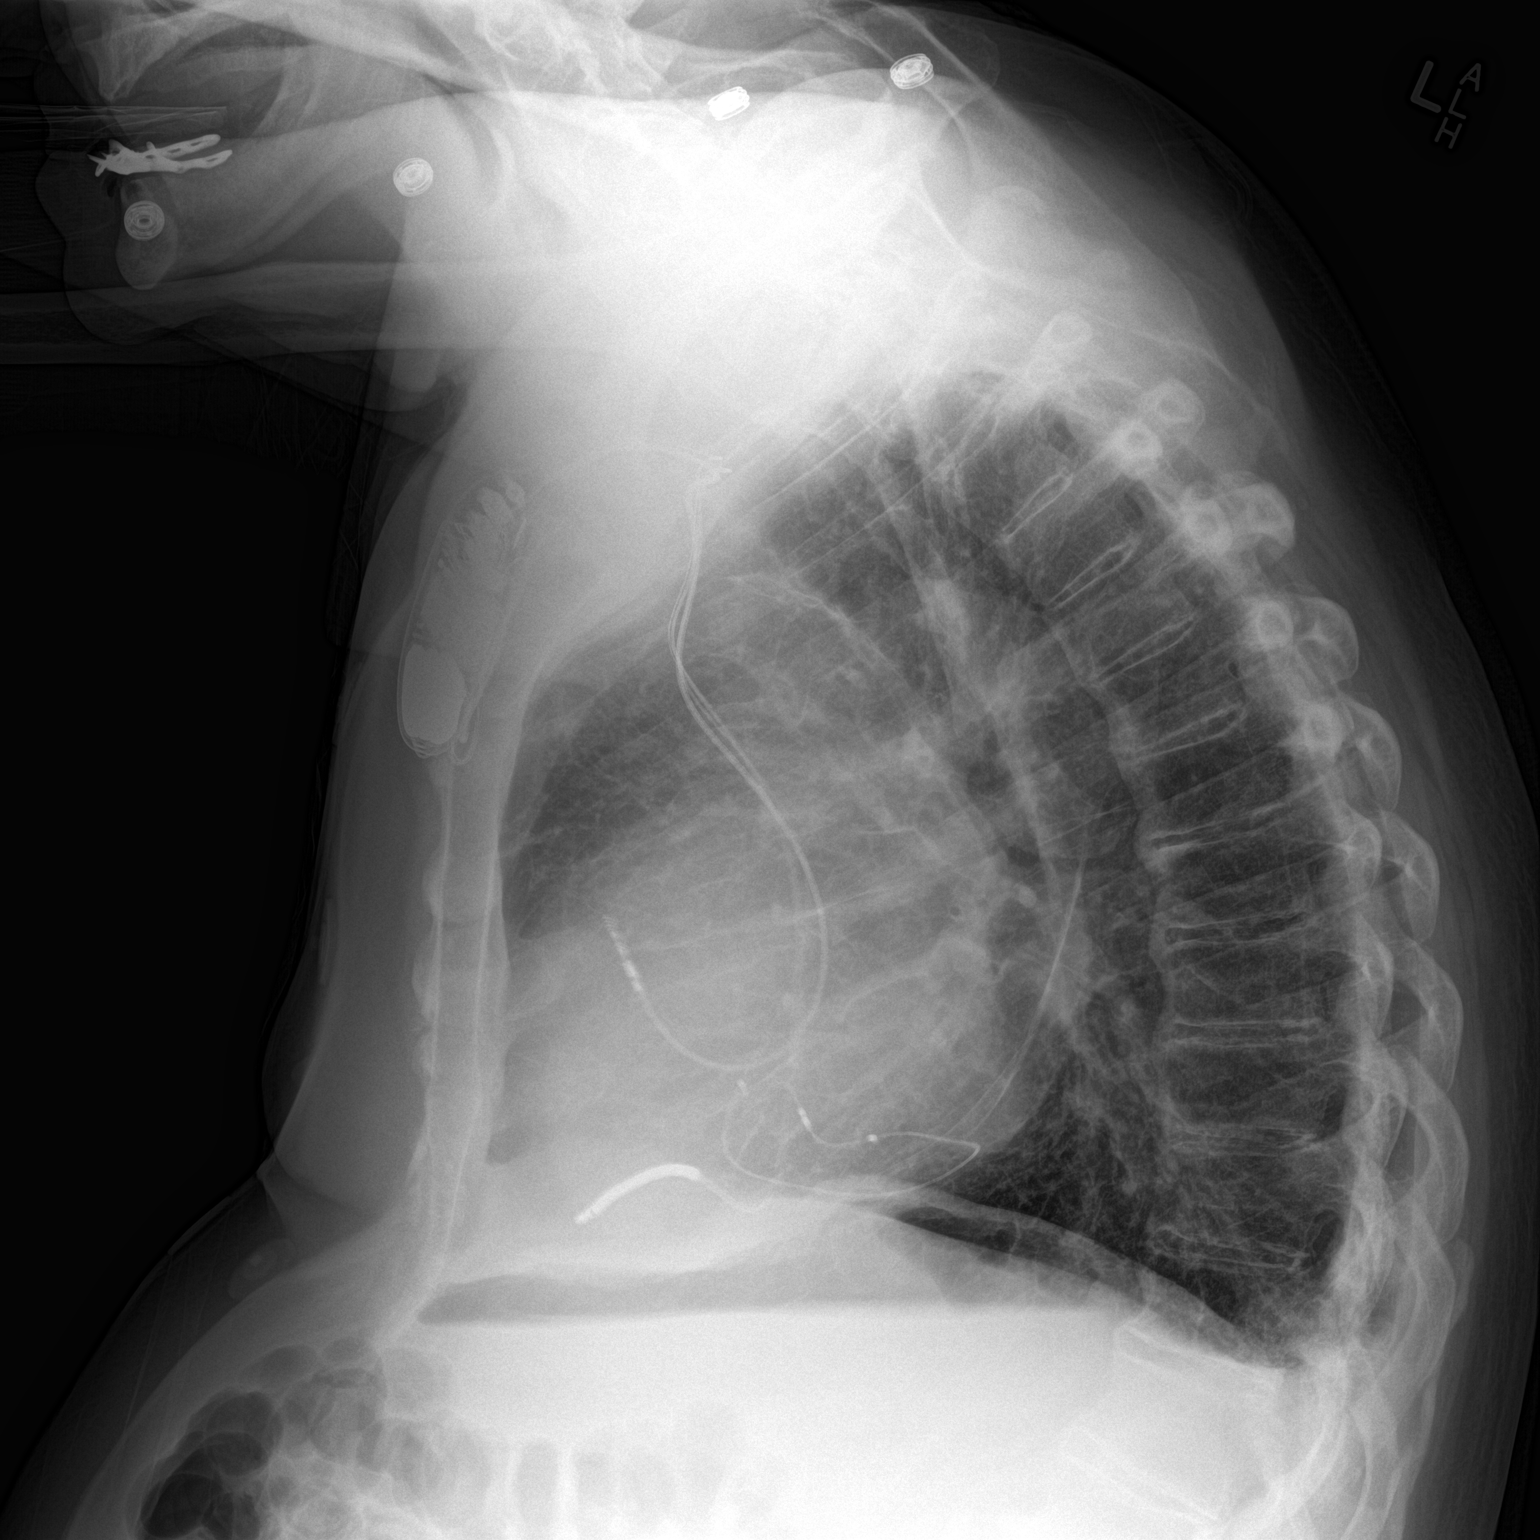

[2 of 2 positions shown; findings below may reference images not displayed]

FINDINGS: The lungs are well-aerated. Vascular congestion is noted. Mild
bibasilar opacities could reflect minimal interstitial edema. No
pleural effusion or pneumothorax is seen.

The heart is borderline normal in size. A pacemaker/AICD is noted at
the left chest wall, with leads ending at the right atrium, right
ventricle and coronary sinus. No acute osseous abnormalities are
seen.
IMPRESSION: Vascular congestion noted. Mild bibasilar opacities could reflect
minimal interstitial edema.

## 2016-07-18 ENCOUNTER — Telehealth (HOSPITAL_COMMUNITY): Payer: Self-pay | Admitting: Vascular Surgery

## 2016-07-18 ENCOUNTER — Ambulatory Visit (HOSPITAL_COMMUNITY)
Admission: RE | Admit: 2016-07-18 | Discharge: 2016-07-18 | Disposition: A | Discharge status: Home / Self Care | Payer: Medicare Other | Source: Ambulatory Visit | Attending: Hematology & Oncology | Admitting: Hematology & Oncology

## 2016-07-18 DIAGNOSIS — R918 Other nonspecific abnormal finding of lung field: Secondary | ICD-10-CM | POA: Insufficient documentation

## 2016-07-18 DIAGNOSIS — K862 Cyst of pancreas: Secondary | ICD-10-CM | POA: Insufficient documentation

## 2016-07-18 DIAGNOSIS — K5730 Diverticulosis of large intestine without perforation or abscess without bleeding: Secondary | ICD-10-CM | POA: Insufficient documentation

## 2016-07-18 DIAGNOSIS — N281 Cyst of kidney, acquired: Secondary | ICD-10-CM | POA: Diagnosis not present

## 2016-07-18 DIAGNOSIS — K8020 Calculus of gallbladder without cholecystitis without obstruction: Secondary | ICD-10-CM | POA: Insufficient documentation

## 2016-07-18 LAB — POCT I-STAT CREATININE: Creatinine, Ser: 1.2 mg/dL (ref 0.61–1.24)

## 2016-07-18 MED ORDER — IOPAMIDOL (ISOVUE-300) INJECTION 61%
100.0000 mL | Freq: Once | INTRAVENOUS | Status: AC | PRN
  Administered 2016-07-18: 100 mL via INTRAVENOUS

## 2016-07-18 NOTE — Telephone Encounter (Signed)
Left pt message to call to make f.u appt w/ [facility]

## 2016-07-23 ENCOUNTER — Telehealth: Payer: Self-pay | Admitting: Cardiology

## 2016-07-23 ENCOUNTER — Ambulatory Visit (INDEPENDENT_AMBULATORY_CARE_PROVIDER_SITE_OTHER): Payer: Medicare Other

## 2016-07-23 ENCOUNTER — Ambulatory Visit (INDEPENDENT_AMBULATORY_CARE_PROVIDER_SITE_OTHER): Payer: Medicare Other | Admitting: *Deleted

## 2016-07-23 DIAGNOSIS — Z95810 Presence of automatic (implantable) cardiac defibrillator: Secondary | ICD-10-CM

## 2016-07-23 DIAGNOSIS — I428 Other cardiomyopathies: Secondary | ICD-10-CM | POA: Diagnosis not present

## 2016-07-23 DIAGNOSIS — I5022 Chronic systolic (congestive) heart failure: Secondary | ICD-10-CM

## 2016-07-23 NOTE — Telephone Encounter (Signed)
Spoke with pt and reminded pt of remote transmission that is due today. Pt verbalized understanding.   

## 2016-07-24 ENCOUNTER — Telehealth: Payer: Self-pay

## 2016-07-24 NOTE — Progress Notes (Signed)
EPIC Encounter for ICM Monitoring  Patient Name: Chad Brown is a 75 y.o. male Date: 07/24/2016 Primary Care Physican: Sherrie Mustache, MD Primary Cardiologist:Nishan Electrophysiologist: Allred Dry Weight:unknown Bi-V Pacing: 98.1%               Attempted ICM call and unable to reach. Left detailed message regarding transmission.  Transmission reviewed.   Thoracic impedance normal   Follow-up plan: ICM clinic phone appointment on 08/29/2016  Copy of ICM check sent to device physician.   ICM trend: 07/23/2016      Rosalene Billings, RN 07/24/2016 2:58 PM

## 2016-07-24 NOTE — Telephone Encounter (Signed)
Remote ICM transmission received.  Attempted patient call and left detailed message regarding transmission and next ICM scheduled for 08/29/2016.  Advised to return call for any fluid symptoms or questions.

## 2016-07-25 ENCOUNTER — Encounter (HOSPITAL_COMMUNITY): Payer: Medicare Other | Attending: Hematology & Oncology | Admitting: Hematology & Oncology

## 2016-07-25 ENCOUNTER — Encounter (HOSPITAL_COMMUNITY): Payer: Medicare Other

## 2016-07-25 ENCOUNTER — Encounter (HOSPITAL_COMMUNITY): Payer: Self-pay | Admitting: Hematology & Oncology

## 2016-07-25 VITALS — BP 142/74 | HR 70 | Temp 98.3°F | Resp 18 | Wt 229.0 lb

## 2016-07-25 DIAGNOSIS — D696 Thrombocytopenia, unspecified: Secondary | ICD-10-CM

## 2016-07-25 DIAGNOSIS — C439 Malignant melanoma of skin, unspecified: Secondary | ICD-10-CM | POA: Diagnosis not present

## 2016-07-25 DIAGNOSIS — K862 Cyst of pancreas: Secondary | ICD-10-CM

## 2016-07-25 DIAGNOSIS — R935 Abnormal findings on diagnostic imaging of other abdominal regions, including retroperitoneum: Secondary | ICD-10-CM | POA: Diagnosis not present

## 2016-07-25 DIAGNOSIS — R9389 Abnormal findings on diagnostic imaging of other specified body structures: Secondary | ICD-10-CM

## 2016-07-25 DIAGNOSIS — D500 Iron deficiency anemia secondary to blood loss (chronic): Secondary | ICD-10-CM

## 2016-07-25 DIAGNOSIS — Z85820 Personal history of malignant melanoma of skin: Secondary | ICD-10-CM | POA: Diagnosis not present

## 2016-07-25 DIAGNOSIS — D649 Anemia, unspecified: Secondary | ICD-10-CM | POA: Diagnosis not present

## 2016-07-25 DIAGNOSIS — K869 Disease of pancreas, unspecified: Secondary | ICD-10-CM

## 2016-07-25 LAB — CBC WITH DIFFERENTIAL/PLATELET
BASOS ABS: 0 10*3/uL (ref 0.0–0.1)
BASOS PCT: 1 %
Eosinophils Absolute: 0.1 10*3/uL (ref 0.0–0.7)
Eosinophils Relative: 2 %
HEMATOCRIT: 35.4 % — AB (ref 39.0–52.0)
HEMOGLOBIN: 11.7 g/dL — AB (ref 13.0–17.0)
Lymphocytes Relative: 22 %
Lymphs Abs: 1 10*3/uL (ref 0.7–4.0)
MCH: 31.4 pg (ref 26.0–34.0)
MCHC: 33.1 g/dL (ref 30.0–36.0)
MCV: 94.9 fL (ref 78.0–100.0)
MONOS PCT: 12 %
Monocytes Absolute: 0.5 10*3/uL (ref 0.1–1.0)
NEUTROS ABS: 2.8 10*3/uL (ref 1.7–7.7)
NEUTROS PCT: 63 %
Platelets: 136 10*3/uL — ABNORMAL LOW (ref 150–400)
RBC: 3.73 MIL/uL — AB (ref 4.22–5.81)
RDW: 14.8 % (ref 11.5–15.5)
WBC: 4.4 10*3/uL (ref 4.0–10.5)

## 2016-07-25 LAB — IRON AND TIBC
Iron: 74 ug/dL (ref 45–182)
Saturation Ratios: 28 % (ref 17.9–39.5)
TIBC: 266 ug/dL (ref 250–450)
UIBC: 192 ug/dL

## 2016-07-25 LAB — COMPREHENSIVE METABOLIC PANEL
ALBUMIN: 3.7 g/dL (ref 3.5–5.0)
ALK PHOS: 110 U/L (ref 38–126)
ALT: 19 U/L (ref 17–63)
AST: 19 U/L (ref 15–41)
Anion gap: 4 — ABNORMAL LOW (ref 5–15)
BILIRUBIN TOTAL: 0.5 mg/dL (ref 0.3–1.2)
BUN: 11 mg/dL (ref 6–20)
CALCIUM: 8.3 mg/dL — AB (ref 8.9–10.3)
CO2: 31 mmol/L (ref 22–32)
Chloride: 101 mmol/L (ref 101–111)
Creatinine, Ser: 1.29 mg/dL — ABNORMAL HIGH (ref 0.61–1.24)
GFR calc Af Amer: >60 mL/min (ref >60)
GFR calc non Af Amer: 53 mL/min — ABNORMAL LOW (ref >60)
GLUCOSE: 142 mg/dL — AB (ref 65–99)
Potassium: 4.2 mmol/L (ref 3.5–5.1)
Sodium: 136 mmol/L (ref 135–145)
TOTAL PROTEIN: 6.9 g/dL (ref 6.5–8.1)

## 2016-07-25 LAB — FERRITIN: Ferritin: 165 ng/mL (ref 24–336)

## 2016-07-25 NOTE — Patient Instructions (Addendum)
[place] at [facility] Discharge Instructions  RECOMMENDATIONS MADE BY THE CONSULTANT AND ANY TEST RESULTS WILL BE SENT TO YOUR REFERRING PHYSICIAN.  You saw Dr.Penland today. Chest X-Ray in 4 weeks. Follow up in 6 months with lab work. See Amy at checkout for appointments.  Thank you for choosing [place] at [facility] to provide your oncology and hematology care.  To afford each patient quality time with our provider, please arrive at least 15 minutes before your scheduled appointment time.   Beginning January 23rd 2017 lab work for the [facility] will be done in the  Main lab at [facility] on 1st floor. If you have a lab appointment with the [facility] please come in thru the  Main Entrance and check in at the main information desk  You need to re-schedule your appointment should you arrive 10 or more minutes late.  We strive to give you quality time with our providers, and arriving late affects you and other patients whose appointments are after yours.  Also, if you no show three or more times for appointments you may be dismissed from the clinic at the providers discretion.     Again, thank you for choosing [facility].  Our hope is that these requests will decrease the amount of time that you wait before being seen by our physicians.       _____________________________________________________________  Should you have questions after your visit to [facility], please contact our office at [phone] between the hours of 8:30 a.m. and 4:30 p.m.  Voicemails left after 4:30 p.m. will not be returned until the following business day.  For prescription refill requests, have your pharmacy contact our office.         Resources For Cancer Patients and their Caregivers ? American Cancer Society: Can assist with transportation, wigs, general needs, runs Look Good Feel Better.         303 241 2273 ? Cancer Care: Provides financial assistance, online support groups, medication/co-pay assistance.  1-800-813-HOPE (801) 801-6132) ? [facility] Assists [place] cancer patients and their families through emotional , educational and financial support.  603-622-3442 ? [place] DSS Where to apply for food stamps, Medicaid and utility assistance. 626-851-7366 ? [facility]: Transportation to medical appointments. 7240776543 ? Social Security Administration: May apply for disability if have a Stage IV cancer. (205)277-6608 313-422-3911 ? [employer], Disability and Transit Services: Assists with nutrition, care and transit needs. [place] Support Programs: @10RELATIVEDAYS @ > Cancer Support Group  2nd Tuesday of the month 1pm-2pm, Journey Room  > Creative Journey  3rd Tuesday of the month 1130am-1pm, Journey Room  > Look Good Feel Better  1st Wednesday of the month 10am-12 noon, Journey Room (Call [place] to register [phone])

## 2016-07-25 NOTE — Progress Notes (Signed)
[facility] NOTE  Patient Care Team: Dione Housekeeper, MD as PCP - General (Family Medicine)  [address] / [place] [address]   CHIEF COMPLAINTS/PURPOSE OF CONSULTATION:  T2a N0 Melanoma of the L Thigh s/p WLE and sentinel node with Dr. Freddi Che Abnormal CT and MRI imaging of the abdomen Anemia Iron deficiency on oral iron and requiring intermittent IV iron replacement  HISTORY OF PRESENTING ILLNESS:  Chad Brown 75 y.o. male is here because of a history of melanoma and abnormal imaging of the abdomen. Chad Brown follows with Dr. Edrick Oh for his primary medical care.  Chad Brown is also followed for an iridociliary lesion of the left eye which is longstanding that has been diagnosed as either a nevus vs.a slow growing melanoma.   Chad Brown was seen by Dr. Freddi Che at [facility] for a T2a melanoma of the left thigh diagnosed in 06/2014. Chad Brown underwent a WLE and sentinel node procedure on 06/18/2014 with negative sentinel nodes. Chad Brown has an established follow-up schedule with Dr. Freddi Che and Dr. Nevada Crane his local dermatologist.  Because of the findings in his eye Chad Brown underwent a CT of the abdomen and pelvis with contrast on September 5 of 2014. There was an abnormality noted along the ventral surface of the pancreatic head. Follow-up imaging was recommended. A borderline enlarged right external iliac lymph node was noted at 1.2 cm. Repeat CT imaging was done on 12/18/2013 and was stable. Last imaging of the abdomen on 07/19/2014 was stable comparing back to 06/05/2013.   Chad Brown returns to the [facility] today accompanied by his wife. I personally reviewed and went over imaging studies and follow up recommendations with the patient.  Chad Brown hasn't had a cold recently but Chad Brown has had some congestion in his throat. Chad Brown denies a hacking cough.  Chad Brown did another fecal test with PCP Dr. Edrick Oh and it was positive. Chad Brown has another colonoscopy scheduled with Dr. Carlean Purl.  "Very few weeks go by  that I don't have one day [of diarrhea]". This diarrhea occurs in the mornings. Imodium resolves this diarrhea.  Chad Brown reports shortness of breath with exertion. Chad Brown reports dizziness upon standing.  Chad Brown has a lesion on his right cheek and plans on having Dr. Nevada Crane look at this on the 13th. Chad Brown also notes several areas behind his ears, on his chest, and on his arms.   Chad Brown already received a flu shot this year.   MEDICAL HISTORY:  Past Medical History:  Diagnosis Date  . Adenomatous colon polyp    tubular  . AICD (automatic cardioverter/defibrillator) present 03/08/2015   MDT CRTD   . Anemia    iron deficient  . Arthritis    "about all my joints; hands, knees, back" (03/08/2015)  . Atherosclerosis   . Cholelithiasis   . Chronic systolic CHF (congestive heart failure) ([place])    a. New dx 12/2012 ? NICM, may be r/t afib. b. Nuc 03/2013 - normal;  c. 03/2015 TEE EF 15-20%.  . Chronic systolic CHF (congestive heart failure) ([place])   . Colon polyp, hyperplastic 5/16   removed precancerous lesions  . Diverticulosis   . Hyperlipidemia   . Hypertension   . Melanoma of eye ([place]) 2000's   "right; it's never been biopsied"  . Melanoma of lower leg ([place]) 2015   "left; right at my knee"  . Myocardial infarction 1998  . OSA (obstructive sleep apnea) 01/04/2016  . PAF (paroxysmal atrial fibrillation) ([facility] [place])    a.  Dx 12/2012, s/p TEE/DCCV 01/26/13. b. On Xarelto (CHA2DS2VASc = 3);  c. 03/2015 TEE (EF 15-20%, no LAA thrombus) and DCCV - amio increased to 200 mg bid.  . Urinary hesitancy due to benign prostatic hypertrophy     SURGICAL HISTORY: Past Surgical History:  Procedure Laterality Date  . BACK SURGERY    . CARDIAC CATHETERIZATION  1998  . CARDIOVERSION N/A 01/26/2013   Procedure: CARDIOVERSION;  Surgeon: Lelon Perla, MD;  Location: [facility] ENDOSCOPY;  Service: Cardiovascular;  Laterality: N/A;  . CARDIOVERSION N/A 03/23/2015   Procedure: CARDIOVERSION;  Surgeon: Jerline Pain, MD;  Location: [facility];  Service: Cardiovascular;  Laterality: N/A;  . CATARACT EXTRACTION Right ~ 2006  . COLONOSCOPY WITH PROPOFOL N/A 02/10/2015   Procedure: COLONOSCOPY WITH PROPOFOL;  Surgeon: Gatha Mayer, MD;  Location: [facility] ENDOSCOPY;  Service: Endoscopy;  Laterality: N/A;  . ENTEROSCOPY N/A 08/17/2015   Procedure: ENTEROSCOPY;  Surgeon: Gatha Mayer, MD;  Location: [facility] ENDOSCOPY;  Service: Endoscopy;  Laterality: N/A;  . EP IMPLANTABLE DEVICE N/A 03/08/2015   MDT Hillery Aldo CRT-D for nonischemic CM by Dr Rayann Heman for primary prevention  . GLAUCOMA SURGERY Right ~ 2006   "put 3 stents in to drain fluid" (03/08/2015)  . INCISION AND DRAINAGE ABSCESS POSTERIOR CERVICALSPINE  05/2012  . JOINT REPLACEMENT    . MELANOMA EXCISION Left 2015   "lower leg; right at my knee"  . REFRACTIVE SURGERY Right ~ 2006 X 2   "twice; both done at [facility]" (03/08/2015  . SURGERY SCROTAL / TESTICULAR Right 1990's  . TEE WITHOUT CARDIOVERSION N/A 01/26/2013   Procedure: TRANSESOPHAGEAL ECHOCARDIOGRAM (TEE);  Surgeon: Lelon Perla, MD;  Location: [facility];  Service: Cardiovascular;  Laterality: N/A;  [name] anes. /   . TEE WITHOUT CARDIOVERSION N/A 10/05/2014   Procedure: TRANSESOPHAGEAL ECHOCARDIOGRAM (TEE)  with cardioversion;  Surgeon: Thayer Headings, MD;  Location[facility] END[facility]COPY;  Service: Cardiovascular;  Laterality: N/A;  12:52 synched cardioversion at 120 joules,...afib to SR...12 lead EKG ordered.Marland KitchenMarland KitchenCardiozem d/c'ed per MD verbal order at SR  . TEE WITHOUT CARDIOVERSION N/A 03/23/2015   Procedure: TRANSESOPHAGEAL ECHOCARDIOGRAM (TEE);  Surgeon: Jerline Pain, MD;  Location: [facility] ENDOSCOPY;  Service: Cardiovascular;  Laterality: N/A;  . THORACIC SPINE SURGERY  03/2000   "ground calcium deposits from upper thoracic" (01/26/2013)  . TOTAL HIP ARTHROPLASTY Right 06/2007    SOCIAL HISTORY: Social History   Social History  . Marital status: Married    Spouse name: N/A  . Number of children: 3  . Years of education: N/A    Occupational History  . retired    Social History Main Topics  . Smoking status: Former Smoker    Packs/day: 3.00    Years: 48.00    Types: Cigarettes    Quit date: 09/28/2000  . Smokeless tobacco: Never Used  . Alcohol use No  . Drug use: No  . Sexual activity: Yes   Other Topics Concern  . Not on file   Social History Narrative   Pt lives in [place] with spouse.  3 children are grown and healthy.   Retired.  Ran a country store for 30 years, previously worked in the [employer] for 16 years   Married for 41 years Born in [place] [place]. 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 3 sons and 3 grandchildren  Quit smoking 14 years ago Plays golf.  FAMILY HISTORY: Family History  Problem Relation  Age of Onset  . Kidney disease Mother   . Heart disease Mother     MI, open heart  . Diabetes Mother     dialysis  . Leukemia Father   . Colon cancer Paternal Uncle   . Lung cancer Paternal Uncle     x 2  . Prostate cancer Paternal Uncle   . Diabetes Maternal Grandmother   . Heart attack Maternal Uncle   . Diabetes Maternal Aunt     x 3  . Diabetes Maternal Uncle    indicated that his mother is deceased. Chad Brown indicated that his father is deceased. Chad Brown indicated that the status of his maternal grandmother is unknown. Chad Brown indicated that the status of his maternal aunt is unknown.     Father passed away at 19 of leukemia  Mother passed away at 33 of diabetes, kidney disease and CHF.  1 brother and 2 sisters. Sister had throat cancer. Another sister had skin cancer.  ALLERGIES:  is allergic to pravastatin sodium and zithromax [azithromycin].  MEDICATIONS:  Current Outpatient Prescriptions  Medication Sig Dispense Refill  . acetaminophen (TYLENOL) 500 MG tablet Take 1,000 mg by mouth every 4 (four) hours as needed for moderate pain or headache.    [name] amiodarone (PACERONE) 200 MG tablet Take 1 tablet (200 mg  total) by mouth daily. 30 tablet 11  . carvedilol (COREG) 12.5 MG tablet Take 1 tablet (12.5 mg total) by mouth 2 (two) times daily with a meal. 180 tablet 3  . dorzolamide-timolol (COSOPT) 22.3-6.8 MG/ML ophthalmic solution Place 1 drop into the right eye 2 (two) times daily.    [name] escitalopram (LEXAPRO) 20 MG tablet TAKE 1 TABLET ONCE A DAY 90 tablet 1  . ezetimibe (ZETIA) 10 MG tablet Take 1 tablet (10 mg total) by mouth daily. 90 tablet 3  . finasteride (PROSCAR) 5 MG tablet Take 5 mg by mouth every morning.     . furosemide (LASIX) 20 MG tablet Take 2 tablets (40 mg total) by mouth daily. 180 tablet 3  . gabapentin (NEURONTIN) 100 MG capsule Take by mouth. Take 1 in the morning and 2 at night    . losartan (COZAAR) 50 MG tablet Take 25-50 mg by mouth as directed.    [name] NIASPAN 500 MG CR tablet Take 1,000 mg by mouth at bedtime.     [name] Glycol-Propyl Glycol (SYSTANE ULTRA OP) Place 1 drop into the left eye 2 (two) times daily.     . potassium chloride SA (K-DUR,KLOR-CON) 20 MEQ tablet Take 1 tablet (20 mEq total) by mouth 2 (two) times daily. 135 tablet 3  . rivaroxaban (XARELTO) 20 MG TABS tablet Take 1 tablet (20 mg total) by mouth daily with supper. 90 tablet 3  . spironolactone (ALDACTONE) 25 MG tablet Take 0.5 tablets (12.5 mg total) by mouth daily. 45 tablet 3  . tamsulosin (FLOMAX) 0.4 MG CAPS Take 0.4 mg by mouth at bedtime.      No current facility-administered medications for this visit.     Review of Systems  Constitutional: Negative.  Negative for weight loss.  HENT: Positive for congestion.   Eyes: Negative.   Respiratory: Positive for shortness of breath (with exertion).   Cardiovascular: Negative.   Gastrointestinal: Positive for diarrhea.       Intermittent morning diarrhea managed with imodium  Genitourinary: Negative.   Musculoskeletal: Positive for back pain.  Skin: Negative.   Neurological: Positive for dizziness (upon standing).  Endo/Heme/Allergies:  Negative.  Psychiatric/Behavioral: Negative.   All other systems reviewed and are negative. 14 point ROS was done and is otherwise as detailed above or in HPI   PHYSICAL EXAMINATION: ECOG PERFORMANCE STATUS: 0 - Asymptomatic  Vitals:   07/25/16 1349  BP: (!) 142/74  Pulse: 70  Resp: 18  Temp: 98.3 F (36.8 C)   Filed Weights   07/25/16 1349  Weight: 229 lb (103.9 kg)   Physical Exam  Constitutional: Chad Brown is oriented to person, place, and time and well-developed, well-nourished, and in no distress.  HENT:  Head: Normocephalic and atraumatic.  Mouth/Throat: Oropharynx is clear and moist.  Eyes: Conjunctivae and EOM are normal. Pupils are equal, round, and reactive to light.  Neck: Normal range of motion. Neck supple.  Cardiovascular: Normal rate, regular rhythm and normal heart sounds.   Pulmonary/Chest: Effort normal and breath sounds normal.  Abdominal: Soft. Bowel sounds are normal.  Musculoskeletal: Normal range of motion.  On Inferior aspect of his incision the left thigh there is an area of palpable firmness. (patient notes this is chronic) Remainder of surgical site is without abnormality. No other palpable nodules noted. L groin without palpable abnormality   Neurological: Chad Brown is alert and oriented to person, place, and time. Gait normal.  Skin: Skin is warm and dry.  Small 0.5cm raised lesion inferior to R eye, flesh colored  Nursing note and vitals reviewed.   LABORATORY DATA:  I have reviewed the data as listed Results for Chad, Brown (MRN [id number]) as of 07/25/2016 14:00  Ref. Range 07/25/2016 12:49  Sodium Latest Ref Range: 135 - 145 mmol/L 136  Potassium Latest Ref Range: 3.5 - 5.1 mmol/L 4.2  Chloride Latest Ref Range: 101 - 111 mmol/L 101  CO2 Latest Ref Range: 22 - 32 mmol/L 31  BUN Latest Ref Range: 6 - 20 mg/dL 11  Creatinine Latest Ref Range: 0.61 - 1.24 mg/dL 1.29 (H)  Calcium Latest Ref Range: 8.9 - 10.3 mg/dL 8.3 (L)  EGFR (Non-African  Amer.) Latest Ref Range: >60 mL/min 53 (L)  EGFR (African American) Latest Ref Range: >60 mL/min >60  Glucose Latest Ref Range: 65 - 99 mg/dL 142 (H)  Anion gap Latest Ref Range: 5 - 15  4 (L)  Alkaline Phosphatase Latest Ref Range: 38 - 126 U/L 110  Albumin Latest Ref Range: 3.5 - 5.0 g/dL 3.7  AST Latest Ref Range: 15 - 41 U/L 19  ALT Latest Ref Range: 17 - 63 U/L 19  Total Protein Latest Ref Range: 6.5 - 8.1 g/dL 6.9  Total Bilirubin Latest Ref Range: 0.3 - 1.2 mg/dL 0.5  WBC Latest Ref Range: 4.0 - 10.5 K/uL 4.4  RBC Latest Ref Range: 4.22 - 5.81 MIL/uL 3.73 (L)  Hemoglobin Latest Ref Range: 13.0 - 17.0 g/dL 11.7 (L)  HCT Latest Ref Range: 39.0 - 52.0 % 35.4 (L)  MCV Latest Ref Range: 78.0 - 100.0 fL 94.9  MCH Latest Ref Range: 26.0 - 34.0 pg 31.4  MCHC Latest Ref Range: 30.0 - 36.0 g/dL 33.1  RDW Latest Ref Range: 11.5 - 15.5 % 14.8  Platelets Latest Ref Range: 150 - 400 K/uL 136 (L)  Neutrophils Latest Units: % 63  Lymphocytes Latest Units: % 22  Monocytes Relative Latest Units: % 12  Eosinophil Latest Units: % 2  Basophil Latest Units: % 1  NEUT# Latest Ref Range: 1.7 - 7.7 K/uL 2.8  Lymphocyte # Latest Ref Range: 0.7 - 4.0 K/uL 1.0  Monocyte # Latest Ref Range: 0.1 -  1.0 K/uL 0.5  Eosinophils Absolute Latest Ref Range: 0.0 - 0.7 K/uL 0.1  Basophils Absolute Latest Ref Range: 0.0 - 0.1 K/uL 0.0   RADIOGRAPHIC STUDIES: I have personally reviewed the radiological images as listed and agreed with the findings in the report. Study Result   CLINICAL DATA:  Follow-up pancreatic cyst. History of cholelithiasis and anemia.  EXAM: CT ABDOMEN AND PELVIS WITH CONTRAST  TECHNIQUE: Multidetector CT imaging of the abdomen and pelvis was performed using the standard protocol following bolus administration of intravenous contrast.  CONTRAST:  143m ISOVUE-300 IOPAMIDOL (ISOVUE-300) INJECTION 61%  COMPARISON:  Prior CTs ranging from 06/05/2013 through 07/20/2015. MRI  01/03/2015. Move  FINDINGS: Lower chest: There are new patchy and somewhat nodular opacities at both lung bases. These are predominantly subpleural and likely inflammatory. The heart is enlarged. Cardiac pacemaker noted. There is no significant pleural or pericardial effusion.  Hepatobiliary: There are stable tiny low-density hepatic lesions, likely cysts. No worrisome hepatic findings are seen. Small calcified gallstones are present. No gallbladder wall thickening, surrounding inflammation or biliary dilatation.  Pancreas: The low-density lesion anteriorly in the pancreatic head is unchanged. This measures 15 x 11 mm on axial image 21 and up to 15 mm cephalocaudad on coronal image 52. The pancreas otherwise appears normal. There is no pancreatic ductal dilatation or surrounding inflammation.  Spleen: Normal in size without focal abnormality.  Adrenals/Urinary Tract: Both adrenal glands appear normal. The kidneys appear unchanged. There are bilateral low-density renal cysts bilaterally. One on the right measuring 4.3 x 4.3 cm on image 30 has minimal calcification in the wall, but no wall thickening or solid components. There is no hydronephrosis. The bladder appears unremarkable.  Stomach/Bowel: Small hiatal hernia. The stomach, small bowel and appendix otherwise appear normal. There are diverticular changes throughout the distal colon without surrounding inflammation.  Vascular/Lymphatic: There are no enlarged abdominal or pelvic lymph nodes. There is aortic and branch vessel atherosclerosis. No evidence of large vessel occlusion.  Reproductive: The prostate gland is mildly enlarged, but unchanged.  Other: No evidence of abdominal wall mass or hernia. No ascites.  Musculoskeletal: No acute or significant osseous findings. Prior right hip arthroplasty noted. There are mild degenerative changes at the left hip and throughout the lumbar spine. There is ankylosis  of the lower thoracic spine.  IMPRESSION: 1. Cystic lesion in the pancreatic head is unchanged from baseline examination of more than 3 years ago. This lesion demonstrates no worrisome features. Follow up recommendations for such lesions have changed. Current recommendations are for follow-up imaging every 2 years until stability has been demonstrated for 10 years or the patient reaches the age of 89years. Therefore, follow-up CT 2 years from now recommended. This recommendation follows ACR consensus guidelines: Management of Incidental Pancreatic Cysts: A White Paper of the ACR Incidental Findings Committee. [id number];[id number] 2. No acute abdominal findings are seen. 3. There is new ill-defined nodularity of both lung bases, likely inflammatory. Chest radiographic correlation/ follow-up within 4 weeks recommended. 4. Stable additional incidental findings, including cardiomegaly, atherosclerosis, cholelithiasis, distal colonic diverticulosis and bilateral renal cysts.   Electronically Signed   By: WRichardean SaleM.D.   On: 07/18/2016 13:40     ASSESSMENT & PLAN:  T2a N0 Melanoma of the L Thigh s/p WLE and sentinel node with Dr. OFreddi CheAbnormal CT and MRI imaging of the abdomen Anemia/Thrombocytopenia Colonoscopy 02/10/2015 with 5 polyps and sigmoid diverticulosis Iron deficiency with intolerance to oral iron Pancreatic lesion Abnormal imaging chest  His anemia is much improved, Chad Brown is intolerant of oral iron. The most likely cause of his anemia is due to chronic blood loss/malabsorption syndrome. Chad Brown follows with GI in [place] with Dr. Carlean Purl. We will replace iron accordingly. Chad Brown is scheduled for a colonoscopy with Dr. Carlean Purl on 08/07/2016.  Thrombocytopenia is fairly stable. We will continue to monitor this.  On exam there is no obvious evidence of recurrence of his melanoma. CT imaging reveals stability of the pancreas dating back 2 years. Recommendations for  ongoing observation were reviewed with the patient. CT results were reviewed with the patient and noted above. I have scheduled him for CXR in 4 weeks, Chad Brown has no pulmonary symptoms and pulmonary exam is WNL today.   Chad Brown has an upcoming appointment with Dr. Nevada Crane of dermatology.   Chad Brown will return for follow up in 6 months.  Orders Placed This Encounter  Procedures  . DG Chest 2 View    Standing Status:   Future    Standing Expiration Date:   07/25/2017    Order Specific Question:   Reason for Exam (SYMPTOM  OR DIAGNOSIS REQUIRED)    Answer:   abnormal CT chest follow-up    Order Specific Question:   Preferred imaging location?    Answer:   [facility]  . CBC with Differential    Standing Status:   Future    Standing Expiration Date:   07/25/2017  . Comprehensive metabolic panel    Standing Status:   Future    Standing Expiration Date:   07/25/2017  . Ferritin    Standing Status:   Future    Standing Expiration Date:   07/25/2017  . Iron and TIBC    Standing Status:   Future    Standing Expiration Date:   07/25/2017    All questions were answered. The patient knows to call the clinic with any problems, questions or concerns.   This document serves as a record of services personally performed by Ancil Linsey, MD. It was created on her behalf by Arlyce Harman, a trained medical scribe. The creation of this record is based on the scribe's personal observations and the provider's statements to them. This document has been checked and approved by the attending provider.  I have reviewed the above documentation for accuracy and completeness, and I agree with the above.  This note was electronically signed.  Kelby Fam. Whitney Muse, MD

## 2016-08-01 ENCOUNTER — Encounter (HOSPITAL_COMMUNITY): Payer: Self-pay | Admitting: *Deleted

## 2016-08-06 NOTE — Progress Notes (Signed)
08-06-16 1245 Spoke with Chad Brown, [employer] Rep- will have Rep on site in procedure are by 1100 AM - 08-07-16 . Per signed device orders with chart.

## 2016-08-07 ENCOUNTER — Ambulatory Visit (HOSPITAL_COMMUNITY)
Admission: RE | Admit: 2016-08-07 | Discharge: 2016-08-07 | Disposition: A | Discharge status: Home / Self Care | Payer: Medicare Other | Source: Ambulatory Visit | Attending: Internal Medicine | Admitting: Internal Medicine

## 2016-08-07 ENCOUNTER — Encounter (HOSPITAL_COMMUNITY): Payer: Self-pay | Admitting: *Deleted

## 2016-08-07 ENCOUNTER — Encounter (HOSPITAL_COMMUNITY)
Admission: RE | Disposition: A | Discharge status: Home / Self Care | Payer: Self-pay | Source: Ambulatory Visit | Attending: Internal Medicine

## 2016-08-07 ENCOUNTER — Ambulatory Visit (HOSPITAL_COMMUNITY): Payer: Medicare Other | Admitting: Anesthesiology

## 2016-08-07 DIAGNOSIS — Z6834 Body mass index (BMI) 34.0-34.9, adult: Secondary | ICD-10-CM | POA: Insufficient documentation

## 2016-08-07 DIAGNOSIS — I480 Paroxysmal atrial fibrillation: Secondary | ICD-10-CM | POA: Diagnosis not present

## 2016-08-07 DIAGNOSIS — F329 Major depressive disorder, single episode, unspecified: Secondary | ICD-10-CM | POA: Diagnosis not present

## 2016-08-07 DIAGNOSIS — K648 Other hemorrhoids: Secondary | ICD-10-CM | POA: Diagnosis not present

## 2016-08-07 DIAGNOSIS — Z1211 Encounter for screening for malignant neoplasm of colon: Secondary | ICD-10-CM | POA: Diagnosis present

## 2016-08-07 DIAGNOSIS — Z87891 Personal history of nicotine dependence: Secondary | ICD-10-CM | POA: Diagnosis not present

## 2016-08-07 DIAGNOSIS — Z95810 Presence of automatic (implantable) cardiac defibrillator: Secondary | ICD-10-CM | POA: Diagnosis not present

## 2016-08-07 DIAGNOSIS — D122 Benign neoplasm of ascending colon: Secondary | ICD-10-CM | POA: Insufficient documentation

## 2016-08-07 DIAGNOSIS — I110 Hypertensive heart disease with heart failure: Secondary | ICD-10-CM | POA: Insufficient documentation

## 2016-08-07 DIAGNOSIS — I5022 Chronic systolic (congestive) heart failure: Secondary | ICD-10-CM | POA: Diagnosis not present

## 2016-08-07 DIAGNOSIS — N401 Enlarged prostate with lower urinary tract symptoms: Secondary | ICD-10-CM | POA: Insufficient documentation

## 2016-08-07 DIAGNOSIS — K5730 Diverticulosis of large intestine without perforation or abscess without bleeding: Secondary | ICD-10-CM | POA: Diagnosis not present

## 2016-08-07 DIAGNOSIS — G4733 Obstructive sleep apnea (adult) (pediatric): Secondary | ICD-10-CM | POA: Insufficient documentation

## 2016-08-07 DIAGNOSIS — Z96641 Presence of right artificial hip joint: Secondary | ICD-10-CM | POA: Insufficient documentation

## 2016-08-07 DIAGNOSIS — Z7901 Long term (current) use of anticoagulants: Secondary | ICD-10-CM | POA: Insufficient documentation

## 2016-08-07 DIAGNOSIS — Z79899 Other long term (current) drug therapy: Secondary | ICD-10-CM | POA: Diagnosis not present

## 2016-08-07 DIAGNOSIS — Z86010 Personal history of colonic polyps: Secondary | ICD-10-CM | POA: Diagnosis not present

## 2016-08-07 DIAGNOSIS — E785 Hyperlipidemia, unspecified: Secondary | ICD-10-CM | POA: Insufficient documentation

## 2016-08-07 DIAGNOSIS — Z85820 Personal history of malignant melanoma of skin: Secondary | ICD-10-CM | POA: Diagnosis not present

## 2016-08-07 DIAGNOSIS — I252 Old myocardial infarction: Secondary | ICD-10-CM | POA: Insufficient documentation

## 2016-08-07 DIAGNOSIS — D120 Benign neoplasm of cecum: Secondary | ICD-10-CM

## 2016-08-07 DIAGNOSIS — R3911 Hesitancy of micturition: Secondary | ICD-10-CM | POA: Diagnosis not present

## 2016-08-07 DIAGNOSIS — E669 Obesity, unspecified: Secondary | ICD-10-CM | POA: Diagnosis not present

## 2016-08-07 HISTORY — DX: Major depressive disorder, single episode, unspecified: F32.9

## 2016-08-07 HISTORY — DX: Depression, unspecified: F32.A

## 2016-08-07 HISTORY — DX: Other localized visual field defect, right eye: H53.451

## 2016-08-07 HISTORY — DX: Unspecified cataract: H26.9

## 2016-08-07 HISTORY — PX: HOT HEMOSTASIS: SHX5433

## 2016-08-07 HISTORY — DX: Unspecified glaucoma: H40.9

## 2016-08-07 HISTORY — PX: COLONOSCOPY WITH PROPOFOL: SHX5780

## 2016-08-07 SURGERY — COLONOSCOPY WITH PROPOFOL
Anesthesia: Monitor Anesthesia Care

## 2016-08-07 MED ORDER — LACTATED RINGERS IV SOLN
INTRAVENOUS | Status: DC
  Administered 2016-08-07: 1000 mL via INTRAVENOUS

## 2016-08-07 MED ORDER — SODIUM CHLORIDE 0.9 % IV SOLN: INTRAVENOUS | Status: DC

## 2016-08-07 MED ORDER — LACTATED RINGERS IV SOLN
INTRAVENOUS | Status: DC | PRN
  Administered 2016-08-07: 10:00:00 via INTRAVENOUS

## 2016-08-07 MED ORDER — PROPOFOL 10 MG/ML IV BOLUS
INTRAVENOUS | Status: AC
  Filled 2016-08-07: qty 40

## 2016-08-07 MED ORDER — PROPOFOL 500 MG/50ML IV EMUL
INTRAVENOUS | Status: DC | PRN
  Administered 2016-08-07: 200 ug/kg/min via INTRAVENOUS

## 2016-08-07 MED ORDER — RIVAROXABAN 20 MG PO TABS: 20.0000 mg | ORAL_TABLET | Freq: Every day | ORAL | 3 refills | Status: DC

## 2016-08-07 SURGICAL SUPPLY — 21 items

## 2016-08-07 NOTE — Transfer of Care (Signed)
Immediate Anesthesia Transfer of Care Note  Patient: Chad Brown  Procedure(s) Performed: Procedure(s): COLONOSCOPY WITH PROPOFOL (N/A) HOT HEMOSTASIS (ARGON PLASMA COAGULATION/BICAP) (N/A)  Patient Location: PACU  Anesthesia Type:MAC  Level of Consciousness: awake, alert , oriented and patient cooperative  Airway & Oxygen Therapy: Patient Spontanous Breathing and Patient connected to face mask oxygen  Post-op Assessment: Report given to RN, Post -op Vital signs reviewed and stable and Patient moving all extremities X 4  Post vital signs: stable  Last Vitals:  Vitals:   08/07/16 1011 08/07/16 1156  BP: (!) 181/84 (!) 154/89  Pulse:  61  Resp: 10 15  Temp: 36.7 C 36.5 C    Last Pain:  Vitals:   08/07/16 1156  TempSrc: Oral         Complications: No apparent anesthesia complications

## 2016-08-07 NOTE — Anesthesia Postprocedure Evaluation (Signed)
Anesthesia Post Note  Patient: Chad Brown  Procedure(s) Performed: Procedure(s) (LRB): COLONOSCOPY WITH PROPOFOL (N/A) HOT HEMOSTASIS (ARGON PLASMA COAGULATION/BICAP) (N/A)  Patient location during evaluation: PACU Anesthesia Type: MAC Level of consciousness: awake and alert and oriented Pain management: pain level controlled Vital Signs Assessment: post-procedure vital signs reviewed and stable Respiratory status: spontaneous breathing, nonlabored ventilation and respiratory function stable Cardiovascular status: stable and blood pressure returned to baseline Postop Assessment: no signs of nausea or vomiting Anesthetic complications: no    Last Vitals:  Vitals:   08/07/16 1156 08/07/16 1210  BP: (!) 154/89 (!) 188/95  Pulse: 61   Resp: 15   Temp: 36.5 C     Last Pain:  Vitals:   08/07/16 1156  TempSrc: Oral                 Mischelle Reeg A.

## 2016-08-07 NOTE — H&P (Signed)
[facility] History and Physical   Primary Care Physician:  Sherrie Mustache, MD   Reason for Procedure:   Close f/u cecal polyp - ? removed  Plan:    Colonoscopy The risks and benefits as well as alternatives of endoscopic procedure(s) have been discussed and reviewed. All questions answered. The patient agrees to proceed.   HPI: Chad Brown is a 75 y.o. male w/ hx cecal adenoma 01/2015 - ? completely removed - here for f/u colonoscopy   Past Medical History:  Diagnosis Date  . Adenomatous colon polyp    tubular  . AICD (automatic cardioverter/defibrillator) present 03/08/2015   MDT CRTD   . Anemia    iron deficient  . Arthritis    "about all my joints; hands, knees, back" (03/08/2015)  . Atherosclerosis   . Cataract    left eye small  . Cholelithiasis    gallstones  . Chronic systolic CHF (congestive heart failure) ([place])    a. New dx 12/2012 ? NICM, may be r/t afib. b. Nuc 03/2013 - normal;  c. 03/2015 TEE EF 15-20%.  . Chronic systolic CHF (congestive heart failure) ([place])   . Colon polyp, hyperplastic 5/16   removed precancerous lesions  . Depression   . Diverticulosis   . Glaucoma    right eye  . Hyperlipidemia   . Hypertension   . Melanoma of eye ([place]) 2000's   "right; it's never been biopsied"  . Melanoma of lower leg ([place]) 2015   "left; right at my knee"  . Myocardial infarction 1998  . OSA (obstructive sleep apnea) 01/04/2016   uses cpap, pt does not know settings auto set  . PAF (paroxysmal atrial fibrillation) ([place])    a. Dx 12/2012, s/p TEE/DCCV 01/26/13. b. On Xarelto (CHA2DS2VASc = 3);  c. 03/2015 TEE (EF 15-20%, no LAA thrombus) and DCCV - amio increased to 200 mg bid.  . Peripheral vision loss, right 2006  . Urinary hesitancy due to benign prostatic hypertrophy     Past Surgical History:  Procedure Laterality Date  . BACK SURGERY     upper back, cannot turn neck well  . CARDIAC CATHETERIZATION  1998  . CARDIOVERSION N/A 01/26/2013    Procedure: CARDIOVERSION;  Surgeon: Lelon Perla, MD;  Location: [facility] ENDOSCOPY;  Service: Cardiovascular;  Laterality: N/A;  . CARDIOVERSION N/A 03/23/2015   Procedure: CARDIOVERSION;  Surgeon: Jerline Pain, MD;  Location: [place];  Service: Cardiovascular;  Laterality: N/A;  . CATARACT EXTRACTION Right ~ 2006  . COLONOSCOPY WITH PROPOFOL N/A 02/10/2015   Procedure: COLONOSCOPY WITH PROPOFOL;  Surgeon: Gatha Mayer, MD;  Location: [facility] ENDOSCOPY;  Service: Endoscopy;  Laterality: N/A;  . ENTEROSCOPY N/A 08/17/2015   Procedure: ENTEROSCOPY;  Surgeon: Gatha Mayer, MD;  Location: [facility] ENDOSCOPY;  Service: Endoscopy;  Laterality: N/A;  . EP IMPLANTABLE DEVICE N/A 03/08/2015   MDT Hillery Aldo CRT-D for nonischemic CM by Dr Rayann Heman for primary prevention  . GLAUCOMA SURGERY Right ~ 2006   "put 3 stents in to drain fluid" (03/08/2015) not successful, sent to [facility] to try to get last stent out  . INCISION AND DRAINAGE ABSCESS POSTERIOR CERVICALSPINE  05/2012  . JOINT REPLACEMENT    . MELANOMA EXCISION Left 2015   "lower leg; right at my knee"  . REFRACTIVE SURGERY Right ~ 2006 X 2   "twice; both done at [facility]" (03/08/2015  . SURGERY SCROTAL / TESTICULAR Right 1990's  . TEE WITHOUT CARDIOVERSION N/A 01/26/2013   Procedure: TRANSESOPHAGEAL  ECHOCARDIOGRAM (TEE);  Surgeon: Lelon Perla, MD;  Location: [place];  Service: Cardiovascular;  Laterality: N/A;  [name] anes. /   . TEE WITHOUT CARDIOVERSION N/A 10/05/2014   Procedure: TRANSESOPHAGEAL ECHOCARDIOGRAM (TEE)  with cardioversion;  Surgeon: Thayer Headings, MD;  Location[facility] ENDOSCOPY;  Service: Cardiovascular;  Laterality: N/A;  12:52 synched cardioversion at 120 joules,...afib to SR...12 lead EKG ordered.Marland KitchenMarland KitchenCardiozem d/c'ed per MD verbal order at SR  . TEE WITHOUT CARDIOVERSION N/A 03/23/2015   Procedure: TRANSESOPHAGEAL ECHOCARDIOGRAM (TEE);  Surgeon: Jerline Pain, MD;  Location: [facility] ENDOSCOPY;  Service: Cardiovascular;  Laterality: N/A;  . THORACIC  SPINE SURGERY  03/2000   "ground calcium deposits from upper thoracic" (01/26/2013)  . TOTAL HIP ARTHROPLASTY Right 06/2007    Prior to Admission medications   Medication Sig Start Date End Date Taking? Authorizing Provider  amiodarone (PACERONE) 200 MG tablet Take 1 tablet (200 mg total) by mouth daily. 11/10/15  Yes Amber Sena Slate, NP  carvedilol (COREG) 12.5 MG tablet Take 1 tablet (12.5 mg total) by mouth 2 (two) times daily with a meal. 06/11/16  Yes Josue Hector, MD  escitalopram (LEXAPRO) 20 MG tablet TAKE 1 TABLET ONCE A DAY 07/03/16  Yes Baird Cancer, PA-C  ezetimibe (ZETIA) 10 MG tablet Take 1 tablet (10 mg total) by mouth daily. 11/28/15  Yes Josue Hector, MD  finasteride (PROSCAR) 5 MG tablet Take 5 mg by mouth every morning.  02/13/13  Yes Historical Provider, MD  furosemide (LASIX) 20 MG tablet Take 2 tablets (40 mg total) by mouth daily. 05/15/16  Yes Larey Dresser, MD  gabapentin (NEURONTIN) 100 MG capsule Take by mouth. Take 1 in the morning and 2 at night   Yes Historical Provider, MD  losartan (COZAAR) 50 MG tablet Take 25-50 mg by mouth as directed.   Yes Historical Provider, MD  NIASPAN 500 MG CR tablet Take 1,000 mg by mouth at bedtime.  05/09/12  Yes Historical Provider, MD  potassium chloride SA (K-DUR,KLOR-CON) 20 MEQ tablet Take 1 tablet (20 mEq total) by mouth 2 (two) times daily. 11/25/15  Yes Larey Dresser, MD  rivaroxaban (XARELTO) 20 MG TABS tablet Take 1 tablet (20 mg total) by mouth daily with supper. 03/27/16  Yes Josue Hector, MD  spironolactone (ALDACTONE) 25 MG tablet Take 0.5 tablets (12.5 mg total) by mouth daily. 11/25/15  Yes Larey Dresser, MD  tamsulosin (FLOMAX) 0.4 MG CAPS Take 0.4 mg by mouth at bedtime.    Yes Historical Provider, MD  acetaminophen (TYLENOL) 500 MG tablet Take 1,000 mg by mouth every 4 (four) hours as needed for moderate pain or headache.    Historical Provider, MD  dorzolamide-timolol (COSOPT) 22.3-6.8 MG/ML ophthalmic solution  Place 1 drop into the right eye 2 (two) times daily.    Historical Provider, MD  Polyethyl Glycol-Propyl Glycol (SYSTANE ULTRA OP) Place 1 drop into the left eye 2 (two) times daily.     Historical Provider, MD    Current Facility-Administered Medications  Medication Dose Route Frequency Provider Last Rate Last Dose  . 0.9 %  sodium chloride infusion   Intravenous Continuous Gatha Mayer, MD      . lactated ringers infusion   Intravenous Continuous Gatha Mayer, MD        Allergies as of 06/11/2016 - Review Complete 06/11/2016  Allergen Reaction Noted  . Pravastatin sodium Other (See Comments) 03/21/2015  . Zithromax [azithromycin] Diarrhea 10/02/2014    Family History  Problem Relation Age of Onset  . Kidney disease Mother   . Heart disease Mother     MI, open heart  . Diabetes Mother     dialysis  . Leukemia Father   . Colon cancer Paternal Uncle   . Lung cancer Paternal Uncle     x 2  . Prostate cancer Paternal Uncle   . Diabetes Maternal Grandmother   . Heart attack Maternal Uncle   . Diabetes Maternal Aunt     x 3  . Diabetes Maternal Uncle     Social History   Social History  . Marital status: Married    Spouse name: N/A  . Number of children: 3  . Years of education: N/A   Occupational History  . retired    Social History Main Topics  . Smoking status: Former Smoker    Packs/day: 3.00    Years: 48.00    Types: Cigarettes    Quit date: 09/28/2000  . Smokeless tobacco: Never Used  . Alcohol use No  . Drug use: No  . Sexual activity: Yes   Other Topics Concern  . Not on file   Social History Narrative   Pt lives in [place] with spouse.  3 children are grown and healthy.   Retired.  Ran a country store for 30 years, previously worked in the [employer] for 16 years    Review of Systems: Positive for diarrhea/IBS All other review of systems negative except as mentioned in the HPI.  Physical Exam: Vital signs in last 24 hours:      General:   Alert,  Well-developed, well-nourished, pleasant and cooperative in NAD Lungs:  Clear throughout to auscultation.   Heart:  Regular rate and rhythm; no murmurs, clicks, rubs,  or gallops. Abdomen:  Soft, nontender and nondistended. Normal bowel sounds.   Neuro/Psych:  Alert and cooperative. Normal mood and affect. A and O x 3   @Carl  Simonne Maffucci, MD, [facility] [phone] (pager) 08/07/2016 10:10 AM@

## 2016-08-07 NOTE — Anesthesia Preprocedure Evaluation (Addendum)
Anesthesia Evaluation  Patient identified by MRN, date of birth, ID band Patient awake    Reviewed: Allergy & Precautions, NPO status , Patient's Chart, lab work & pertinent test results, reviewed documented beta blocker date and time   History of Anesthesia Complications Negative for: history of anesthetic complications  Airway Mallampati: II  TM Distance: >3 FB Neck ROM: Full    Dental  (+) Edentulous Upper, Edentulous Lower, Dental Advisory Given   Pulmonary sleep apnea and Continuous Positive Airway Pressure Ventilation , former smoker,    Pulmonary exam normal breath sounds clear to auscultation       Cardiovascular hypertension, Pt. on home beta blockers and Pt. on medications + Past MI and +CHF  Normal cardiovascular exam+ dysrhythmias Atrial Fibrillation + pacemaker + Cardiac Defibrillator  Rhythm:Regular Rate:Normal  Study Conclusions  - Left ventricle: The cavity size was normal. Systolic function was moderately reduced. No evidence of thrombus. - Aortic valve: There was trivial regurgitation. - Mitral valve: There was moderate regurgitation. - Left atrium: No evidence of thrombus in the atrial cavity or appendage. - Tricuspid valve: There was moderate regurgitation.     Neuro/Psych PSYCHIATRIC DISORDERS Depression negative neurological ROS  negative psych ROS   GI/Hepatic Neg liver ROS, Hx/o Colon polyps   Endo/Other  negative endocrine ROS  Renal/GU negative Renal ROS  negative genitourinary   Musculoskeletal  (+) Arthritis , Osteoarthritis,    Abdominal (+) + obese,   Peds  Hematology  (+) anemia ,   Anesthesia Other Findings   Reproductive/Obstetrics                            Lab Results  Component Value Date   WBC 4.4 07/25/2016   HGB 11.7 (L) 07/25/2016   HCT 35.4 (L) 07/25/2016   MCV 94.9 07/25/2016   PLT 136 (L) 07/25/2016     Chemistry      Component  Value Date/Time   NA 136 07/25/2016 1249   NA 144 04/15/2015 0848   K 4.2 07/25/2016 1249   CL 101 07/25/2016 1249   CO2 31 07/25/2016 1249   BUN 11 07/25/2016 1249   BUN 13 04/15/2015 0848   CREATININE 1.29 (H) 07/25/2016 1249      Component Value Date/Time   CALCIUM 8.3 (L) 07/25/2016 1249   ALKPHOS 110 07/25/2016 1249   AST 19 07/25/2016 1249   ALT 19 07/25/2016 1249   BILITOT 0.5 07/25/2016 1249     OZH:YQMVH rhythm, RBBB, Q waves in II,III, aVF.  Echo 11/2015: Left ventricle: The cavity size was mildly dilated. Wall   thickness was normal. Systolic function was normal. The estimated   ejection fraction was in the range of 55% to 60%. - Aortic valve: There was trivial regurgitation. - Mitral valve: There was mild regurgitation. - Left atrium: The atrium was moderately dilated. - Atrial septum: No defect or patent foramen ovale was identified. - Pulmonary arteries: PA peak pressure: 35 mm Hg (S).   Anesthesia Physical  Anesthesia Plan  ASA: III  Anesthesia Plan: MAC   Post-op Pain Management:    Induction: Intravenous  Airway Management Planned: Simple Face Mask and Natural Airway  Additional Equipment:   Intra-op Plan:   Post-operative Plan:   Informed Consent: I have reviewed the patients History and Physical, chart, labs and discussed the procedure including the risks, benefits and alternatives for the proposed anesthesia with the patient or authorized representative who has indicated his/her  understanding and acceptance.   Dental advisory given  Plan Discussed with: CRNA, Anesthesiologist and Surgeon  Anesthesia Plan Comments:         Anesthesia Quick Evaluation

## 2016-08-07 NOTE — Discharge Instructions (Signed)
YOU HAD AN ENDOSCOPIC PROCEDURE TODAY: Refer to the procedure report and other information in the discharge instructions given to you for any specific questions about what was found during the examination. If this information does not answer your questions, please call [place] office at [phone] to clarify.  ° °YOU SHOULD EXPECT: Some feelings of bloating in the abdomen. Passage of more gas than usual. Walking can help get rid of the air that was put into your GI tract during the procedure and reduce the bloating. If you had a lower endoscopy (such as a colonoscopy or flexible sigmoidoscopy) you may notice spotting of blood in your stool or on the toilet paper. Some abdominal soreness may be present for a day or two, also. ° °DIET: Your first meal following the procedure should be a light meal and then it is ok to progress to your normal diet. A half-sandwich or bowl of soup is an example of a good first meal. Heavy or fried foods are harder to digest and may make you feel nauseous or bloated. Drink plenty of fluids but you should avoid alcoholic beverages for 24 hours. If you had a esophageal dilation, please see attached instructions for diet.   ° °ACTIVITY: Your care partner should take you home directly after the procedure. You should plan to take it easy, moving slowly for the rest of the day. You can resume normal activity the day after the procedure however YOU SHOULD NOT DRIVE, use power tools, machinery or perform tasks that involve climbing or major physical exertion for 24 hours (because of the sedation medicines used during the test).  ° °SYMPTOMS TO REPORT IMMEDIATELY: °A gastroenterologist can be reached at any hour. Please call [phone]  for any of the following symptoms:  °Following lower endoscopy (colonoscopy, flexible sigmoidoscopy) °Excessive amounts of blood in the stool  °Significant tenderness, worsening of abdominal pains  °Swelling of the abdomen that is new, acute  °Fever of 100° or  higher  °Following upper endoscopy (EGD, EUS, ERCP, esophageal dilation) °Vomiting of blood or coffee ground material  °New, significant abdominal pain  °New, significant chest pain or pain under the shoulder blades  °Painful or persistently difficult swallowing  °New shortness of breath  °Black, tarry-looking or red, bloody stools ° °FOLLOW UP:  °If any biopsies were taken you will be contacted by phone or by letter within the next 1-3 weeks. Call [phone]  if you have not heard about the biopsies in 3 weeks.  °Please also call with any specific questions about appointments or follow up tests. ° °

## 2016-08-07 NOTE — Op Note (Signed)
[facility] Patient Name: Chad Brown Procedure Date: 08/07/2016 MRN: [id number] Attending MD: Gatha Mayer , MD Date of Birth: 1941/03/28 CSN: [id number] Age: 75 Admit Type: Outpatient Procedure:                Colonoscopy Indications:              Personal history of colonic polyps, Close f/u cecal                            polyp removal last year Providers:                Gatha Mayer, MD, Elmer Ramp. Tilden Dome, RN, Corliss Parish, Technician, Elspeth Cho Tech.,                            Technician Referring MD:              Medicines:                Propofol per Anesthesia, Monitored Anesthesia Care Complications:            No immediate complications. Estimated Blood Loss:     Estimated blood loss was minimal. Procedure:                Pre-Anesthesia Assessment:                           - Prior to the procedure, a History and Physical                            was performed, and patient medications and                            allergies were reviewed. The patient's tolerance of                            previous anesthesia was also reviewed. The risks                            and benefits of the procedure and the sedation                            options and risks were discussed with the patient.                            All questions were answered, and informed consent                            was obtained. ASA Grade Assessment: III - A patient                            with severe systemic disease. After reviewing the  risks and benefits, the patient was deemed in                            satisfactory condition to undergo the procedure.                           After obtaining informed consent, the colonoscope                            was passed under direct vision. Throughout the                            procedure, the patient's blood pressure, pulse, and    oxygen saturations were monitored continuously. The                            EC-[id number] ([id number]) scope was introduced through                            the anus and advanced to the the cecum, identified                            by appendiceal orifice and ileocecal valve. The                            patient tolerated the procedure well. The quality                            of the bowel preparation was adequate. The bowel                            preparation used was Miralax. The ileocecal valve,                            appendiceal orifice, and rectum were photographed.                            The colonoscopy was somewhat difficult due to                            significant looping. Successful completion of the                            procedure was aided by changing the patient to a                            supine position and using manual pressure. Scope In: 11:11:44 AM Scope Out: 11:50:21 AM Scope Withdrawal Time: 0 hours 27 minutes 9 seconds  Total Procedure Duration: 0 hours 38 minutes 37 seconds  Findings:      The perianal and digital rectal examinations were normal. Pertinent       negatives include normal prostate (size, shape, and consistency).      Two semi-pedunculated polyps were found in the ascending colon. The  polyps were 10 to 12 mm in size. These polyps were removed with a hot       snare. Resection and retrieval were complete. Verification of patient       identification for the specimen was done. Estimated blood loss: none.      A 7 mm polyp was found in the ascending colon. The polyp was sessile.       The polyp was removed with a cold snare. Resection and retrieval were       complete. Verification of patient identification for the specimen was       done. Estimated blood loss was minimal.      Multiple diverticula were found in the left colon and right colon.      Internal hemorrhoids were found during retroflexion.      The exam was  otherwise without abnormality on direct and retroflexion       views. Impression:               - Two 10 to 12 mm polyps in the ascending colon,                            removed with a hot snare. Resected and retrieved.                           - One 7 mm polyp in the ascending colon, removed                            with a cold snare. Resected and retrieved.                           - Diverticulosis in the left colon and in the right                            colon.                           - Internal hemorrhoids.                           - The examination was otherwise normal on direct                            and retroflexion views. Moderate Sedation:      Please see anesthesia notes, moderate sedation not given Recommendation:           - Patient has a contact number available for                            emergencies. The signs and symptoms of potential                            delayed complications were discussed with the                            patient. Return to normal activities tomorrow.  Written discharge instructions were provided to the                            patient.                           - Resume previous diet.                           - Continue present medications.                           - Resume Xarelto (rivaroxaban) at prior dose in 4                            days.                           - Repeat colonoscopy is recommended for                            surveillance. The colonoscopy date will be                            determined after pathology results from today's                            exam become available for review.                           -                           WILL CC: DR. Barnet Glasgow                           DO NOT RECOMMEND [place] Procedure Code(s):        --- Professional ---                           [phone],  Colonoscopy, flexible; with removal of                            tumor(s), polyp(s), or other lesion(s) by snare                            technique Diagnosis Code(s):        --- Professional ---                           D12.2, Benign neoplasm of ascending colon                           K64.8, Other hemorrhoids  Z86.010, Personal history of colonic polyps                           K57.30, Diverticulosis of large intestine without                            perforation or abscess without bleeding CPT copyright 2016 American Medical Association. All rights reserved. The codes documented in this report are preliminary and upon coder review may  be revised to meet current compliance requirements. Gatha Mayer, MD 08/07/2016 12:13:32 PM This report has been signed electronically. Number of Addenda: 0

## 2016-08-08 ENCOUNTER — Encounter (HOSPITAL_COMMUNITY): Payer: Self-pay | Admitting: Internal Medicine

## 2016-08-08 ENCOUNTER — Encounter: Payer: Self-pay | Admitting: Cardiology

## 2016-08-08 NOTE — Progress Notes (Signed)
Remote ICD transmission.   

## 2016-08-10 ENCOUNTER — Encounter: Payer: Self-pay | Admitting: Internal Medicine

## 2016-08-10 NOTE — Progress Notes (Signed)
3 adenomas Consider repeating colonoscopy 2020

## 2016-08-12 ENCOUNTER — Telehealth: Payer: Self-pay | Admitting: Gastroenterology

## 2016-08-12 NOTE — Telephone Encounter (Signed)
Pt called relating a few episodes of loose stools starting a couple days after colonoscopy and loose stools persist today. He noted BRB when wiping after nonbloody diarrhea this evening. No abdominal pain or other new symptoms. Colonoscopy report reviewed with hot and cold snare polypectomies in ascending colon on 11/7/ He states he started Xarelto 1 day after the procedure. Denies ASA/NSAID usage. Advised bed rest and clear liquids through tomorrow morning. If bleeding recurs he is advised to go to closest ED for evaluation tonight. If bleeding stops please call office in the morning for further advice.

## 2016-08-13 ENCOUNTER — Telehealth: Payer: Self-pay | Admitting: Internal Medicine

## 2016-08-13 ENCOUNTER — Other Ambulatory Visit (HOSPITAL_COMMUNITY)
Admission: RE | Admit: 2016-08-13 | Discharge: 2016-08-13 | Disposition: A | Discharge status: Home / Self Care | Payer: Medicare Other | Source: Ambulatory Visit | Attending: Internal Medicine | Admitting: Internal Medicine

## 2016-08-13 DIAGNOSIS — K625 Hemorrhage of anus and rectum: Secondary | ICD-10-CM | POA: Insufficient documentation

## 2016-08-13 LAB — CBC WITH DIFFERENTIAL/PLATELET
Basophils Absolute: 0 10*3/uL (ref 0.0–0.1)
Basophils Relative: 0 %
EOS PCT: 1 %
Eosinophils Absolute: 0.1 10*3/uL (ref 0.0–0.7)
HCT: 27.9 % — ABNORMAL LOW (ref 39.0–52.0)
HEMOGLOBIN: 8.9 g/dL — AB (ref 13.0–17.0)
LYMPHS ABS: 1.5 10*3/uL (ref 0.7–4.0)
LYMPHS PCT: 19 %
MCH: 31 pg (ref 26.0–34.0)
MCHC: 31.9 g/dL (ref 30.0–36.0)
MCV: 97.2 fL (ref 78.0–100.0)
MONOS PCT: 9 %
Monocytes Absolute: 0.7 10*3/uL (ref 0.1–1.0)
Neutro Abs: 5.6 10*3/uL (ref 1.7–7.7)
Neutrophils Relative %: 71 %
PLATELETS: 129 10*3/uL — AB (ref 150–400)
RBC: 2.87 MIL/uL — AB (ref 4.22–5.81)
RDW: 14.6 % (ref 11.5–15.5)
WBC: 7.8 10*3/uL (ref 4.0–10.5)

## 2016-08-13 NOTE — Telephone Encounter (Signed)
Hold xarelto again  CBC today please - if too far to travel see if he could do closer to home ? [place] [facility]  Any more bleeding, severe weakness go to ED  Check in by phone again tomorrow

## 2016-08-13 NOTE — Addendum Note (Signed)
Addended by: Marlon Pel on: 08/13/2016 02:32 PM   Modules accepted: Orders

## 2016-08-13 NOTE — Telephone Encounter (Signed)
Patient report that he had a colonoscopy with polypectomy on 08/07/16.  Patient resumed his xeralto on 08/08/16.  Patient reports that Friday night had BM with large amount of Bright red blood.  He reports that Saturday no BM and on Monday am he had 3 or more bloody BMs. He reports that he felt weak last night but I am just tired now.  He has not had any additional bleeding since 3:00 am.  Please advise

## 2016-08-13 NOTE — Telephone Encounter (Signed)
Patient reports that he has not had any more bleeding.  He can't go for labs today, he has an appt tomorrow am at Cardiology. He will come for CBC then.  He understands to hold Xeralto and report to ER for worsening symptoms or any additional bleeding.

## 2016-08-14 ENCOUNTER — Encounter (HOSPITAL_COMMUNITY): Payer: Self-pay

## 2016-08-14 ENCOUNTER — Telehealth: Payer: Self-pay | Admitting: Internal Medicine

## 2016-08-14 ENCOUNTER — Ambulatory Visit (HOSPITAL_COMMUNITY)
Admission: RE | Admit: 2016-08-14 | Discharge: 2016-08-14 | Disposition: A | Discharge status: Home / Self Care | Payer: Medicare Other | Source: Ambulatory Visit | Attending: Cardiology | Admitting: Cardiology

## 2016-08-14 VITALS — BP 96/52 | HR 80 | Wt 228.0 lb

## 2016-08-14 DIAGNOSIS — E785 Hyperlipidemia, unspecified: Secondary | ICD-10-CM | POA: Insufficient documentation

## 2016-08-14 DIAGNOSIS — M1990 Unspecified osteoarthritis, unspecified site: Secondary | ICD-10-CM | POA: Diagnosis not present

## 2016-08-14 DIAGNOSIS — G4733 Obstructive sleep apnea (adult) (pediatric): Secondary | ICD-10-CM | POA: Insufficient documentation

## 2016-08-14 DIAGNOSIS — Z85820 Personal history of malignant melanoma of skin: Secondary | ICD-10-CM | POA: Insufficient documentation

## 2016-08-14 DIAGNOSIS — Z7901 Long term (current) use of anticoagulants: Secondary | ICD-10-CM | POA: Insufficient documentation

## 2016-08-14 DIAGNOSIS — I5022 Chronic systolic (congestive) heart failure: Secondary | ICD-10-CM | POA: Diagnosis not present

## 2016-08-14 DIAGNOSIS — Z87891 Personal history of nicotine dependence: Secondary | ICD-10-CM | POA: Insufficient documentation

## 2016-08-14 DIAGNOSIS — N189 Chronic kidney disease, unspecified: Secondary | ICD-10-CM | POA: Diagnosis not present

## 2016-08-14 DIAGNOSIS — R5383 Other fatigue: Secondary | ICD-10-CM | POA: Diagnosis not present

## 2016-08-14 DIAGNOSIS — I480 Paroxysmal atrial fibrillation: Secondary | ICD-10-CM

## 2016-08-14 DIAGNOSIS — D649 Anemia, unspecified: Secondary | ICD-10-CM

## 2016-08-14 DIAGNOSIS — Z8249 Family history of ischemic heart disease and other diseases of the circulatory system: Secondary | ICD-10-CM | POA: Diagnosis not present

## 2016-08-14 DIAGNOSIS — R195 Other fecal abnormalities: Secondary | ICD-10-CM | POA: Diagnosis not present

## 2016-08-14 DIAGNOSIS — I429 Cardiomyopathy, unspecified: Secondary | ICD-10-CM | POA: Diagnosis not present

## 2016-08-14 LAB — CBC
HEMATOCRIT: 26.1 % — AB (ref 39.0–52.0)
HEMOGLOBIN: 8.5 g/dL — AB (ref 13.0–17.0)
MCH: 30.7 pg (ref 26.0–34.0)
MCHC: 32.6 g/dL (ref 30.0–36.0)
MCV: 94.2 fL (ref 78.0–100.0)
Platelets: 127 10*3/uL — ABNORMAL LOW (ref 150–400)
RBC: 2.77 MIL/uL — ABNORMAL LOW (ref 4.22–5.81)
RDW: 15.1 % (ref 11.5–15.5)
WBC: 6.1 10*3/uL (ref 4.0–10.5)

## 2016-08-14 LAB — TSH: TSH: 1.637 u[IU]/mL (ref 0.350–4.500)

## 2016-08-14 NOTE — Telephone Encounter (Signed)
No bleeding x 36 hrs Hgb 8.9 yesterday and 8.5 today Weak BP meds on hold per Dr. Aundra Dubin  Will check on him again tomorrow but so far tolerating outpatient status.  Holding Xarelto until [place]

## 2016-08-14 NOTE — Patient Instructions (Signed)
Labs today (will call for abnormal results, otherwise no news is good news)  Hold Xarelto until next Monday 08/20/2016  Hold Lasix just today 08/14/2016  Hold Losartan until systolic blood pressure is above 100  Follow up in 1 year

## 2016-08-15 ENCOUNTER — Other Ambulatory Visit: Payer: Self-pay

## 2016-08-15 ENCOUNTER — Telehealth (HOSPITAL_COMMUNITY): Payer: Self-pay | Admitting: *Deleted

## 2016-08-15 DIAGNOSIS — K625 Hemorrhage of anus and rectum: Secondary | ICD-10-CM

## 2016-08-15 NOTE — Progress Notes (Signed)
OK - fatigue anticipated with anemia  Please have him get a ferritin and CBC on 11/20 - and call back sooner if more significant (not just wiping) red bleeding or other concerns  He is due to restart Xarelto on 11/20 which is ok as long as he does not have more bleeding before  I am going to let Dr. Whitney Muse his hematologist know he had the bleed - may need an iron infusion sooner

## 2016-08-15 NOTE — Progress Notes (Signed)
Patient ID: Chad Brown, male   DOB: 1941-07-29, 75 y.o.   MRN: [id number] PCP: [facility] Primary Cardiologist: Johnsie Cancel HF Cardiologist: Aundra Dubin  75 yo with paroxysmal atrial fibrillation and chronic systolic CHF thought to be due to nonischemic cardiomyopathy presents for CHF clinic evaluation. He has a cardiac history dating back to 1998, when he was admitted with chest pain and had cardiac cath showing nonobstructive CAD. In 4/14, he was found to be in atrial fibrillation.  Echo showed EF 30-35%.  He had DCCV back to NSR.  He was back in atrial fibrillation in 1/16, and EF was low again on TEE at that time.   Again, he had DCCV.  In 5/16, cardiac MRI showed persistently low EF, so given LBBB, he had Medtronic CRT-D device placed.  He was back in atrial fibrillation in 6/16 and had TEE-guided DCCV again with EF now 15-20% on TEE.  Repeat echo in 2/17 showed EF 55-60%.   Last week, he had colonoscopy with removal of polyps.  He restarted Xarelto the next day.  A few days later, he had profuse BRBPR.  This continued until yesterday morning early.  No hematochezia since then.  BP was low yesterday, SBP 80s.  Today, SBP 90s.  Generally, SBP 120s-140s.  He felt mildly lightheaded and weak yesterday.  Feels better today.  No exertional dyspnea, some balance difficulty.  No chest pain.  He is in NSR today.  Labs (6/16): LDL 153 Labs (7/16): HCT 40, LFTs normal, LFTs normal Labs (9/16): TSH normal, K 4.3, creatinine 1.03, BNP 419 Labs (10/17): K 4.2, creatinine 1.29, LFTs normal Labs (11/17): hgb 8.9  Optivol: fluid index < threshold, impedance stable.  2 hrs/day activity.  1 very brief atrial fibrillation run, otherwise NSR, > 99% BiV paced.   PMH: 1. Atrial fibrillation: Paroxysmal.  Diagnosed 4/14, DCCV 4/14 to NSR.  DCCV 1/16 to NSR.  DCCV 6/16 to NSR.  2. Chronic systolic CHF: Nonischemic cardiomyopathy.  LHC in 1998 with nonobstructive disease.  Cardiolite in 6/14 with no ischemia or infarction.   cMRI (5/16) with EF 34%, mildly dilated LV with diffuse HK worse in anterolateral wall, small punctate areas of LGE in anteroseptum and basal inferior wall (not CAD pattern).  TEE (6/16) with EF 15-20%.  He has Medtronic CRT-D device.  Echo (9/16) with EF 40%, moderate LV dilation, grade II diastolic dysfunction, normal RV size and systolic function, PASP 42 mmHg.  - Hypotension with Entresto.  - Echo (2/17) showed LV functional recovery, EF 55-60% with mild MR.                                                                                                3. Hyperlipidemia: Myalgias with atorvastatin, Livalo, and pravastatin.  4. CKD 5. OA: s/p THR.  6. H/o melanoma 7. Anemia 8. Diverticulosis 9. OSA: Uses CPAP.   SH: Married with 3 children, lives in [place], retired, quit smoking in 2001.    FH: Mother with MI  ROS: All systems reviewed and negative except as per HPI.  Current Outpatient Prescriptions  Medication Sig Dispense  Refill  . acetaminophen (TYLENOL) 500 MG tablet Take 1,000 mg by mouth every 4 (four) hours as needed for moderate pain or headache.    [name] amiodarone (PACERONE) 200 MG tablet Take 1 tablet (200 mg total) by mouth daily. 30 tablet 11  . carvedilol (COREG) 12.5 MG tablet Take 1 tablet (12.5 mg total) by mouth 2 (two) times daily with a meal. 180 tablet 3  . dorzolamide-timolol (COSOPT) 22.3-6.8 MG/ML ophthalmic solution Place 1 drop into the right eye 2 (two) times daily.    [name] escitalopram (LEXAPRO) 20 MG tablet TAKE 1 TABLET ONCE A DAY 90 tablet 1  . ezetimibe (ZETIA) 10 MG tablet Take 1 tablet (10 mg total) by mouth daily. 90 tablet 3  . finasteride (PROSCAR) 5 MG tablet Take 5 mg by mouth every morning.     . furosemide (LASIX) 20 MG tablet Take 2 tablets (40 mg total) by mouth daily. 180 tablet 3  . gabapentin (NEURONTIN) 100 MG capsule Take by mouth. Take 1 in the morning and 2 at night    . losartan (COZAAR) 50 MG tablet Take 25-50 mg by mouth as directed.     [name] NIASPAN 500 MG CR tablet Take 1,000 mg by mouth at bedtime.     [name] Glycol-Propyl Glycol (SYSTANE ULTRA OP) Place 1 drop into the left eye 2 (two) times daily.     . potassium chloride SA (K-DUR,KLOR-CON) 20 MEQ tablet Take 1 tablet (20 mEq total) by mouth 2 (two) times daily. 135 tablet 3  . spironolactone (ALDACTONE) 25 MG tablet Take 0.5 tablets (12.5 mg total) by mouth daily. 45 tablet 3  . tamsulosin (FLOMAX) 0.4 MG CAPS Take 0.4 mg by mouth at bedtime.     . rivaroxaban (XARELTO) 20 MG TABS tablet Take 1 tablet (20 mg total) by mouth daily with supper. RESTART 08/11/16 (Patient not taking: Reported on 08/14/2016) 90 tablet 3   No current facility-administered medications for this encounter.    BP (!) 96/52 (BP Location: Right Arm, Patient Position: Sitting, Cuff Size: Normal)   Pulse 80   Wt 228 lb (103.4 kg)   SpO2 100%   BMI 36.80 kg/m  General: NAD Neck: JVP 7 cm, no thyromegaly or thyroid nodule.  Lungs: Clear to auscultation bilaterally with normal respiratory effort. CV: Nondisplaced PMI.  Heart regular S1/S2, no S3/S4, no murmur.  Trace ankle edema.  No carotid bruit.  Normal pedal pulses.  Abdomen: Soft, nontender, no hepatosplenomegaly, no distention.  Skin: Intact without lesions or rashes.  Neurologic: Alert and oriented x 3.  Psych: Normal affect. Extremities: No clubbing or cyanosis.  HEENT: Normal.   Assessment/Plan: 1. Chronic systolic CHF: Suspect nonischemic cardiomyopathy.  EF 15-20% on 6/16 TEE.  Cardiac MRI in 5/16 showed a LGE pattern that was not suggestive of coronary disease (?myocarditis or infiltrative disease).  There may be a component of tachycardia-mediated cardiomyopathy.  However, the cardiac MRI appears to have been done when he was in NSR.  In 9/16, echo showed EF increased to 40%.  Finally, in 2/17 repeat echo showed EF up to 55-60%.  He has Medtronic CRT-D device.  NYHA class II symptoms with good volume status by exam and Optivol.    - Continue current Lasix.  - Continue current losartan and Coreg.  Continue spironolactone 12.5 daily.  2. Atrial fibrillation: Seems to tolerate poorly.  He is in NSR today.  Device interrogation showed only 1 very brief afib run.  - Continue Xarelto 20  daily.  - Continue amiodarone 200 mg daily. Recent LFTs normal.  He should have regular eye exams.  Check TSH today.   3. Hyperlipidemia: LDL remains quite high on Zetia.  He cannot take statins.   4. Post-colonoscopy bleeding: None since yesterday early am.  He was dizzy yesterday, ok today.   - CBC today.  - Hold losartan until SBP > 100.   - Hold a dose of Lasix.   Followup in 1 year.   Loralie Champagne 08/15/2016 12:20 AM

## 2016-08-15 NOTE — Telephone Encounter (Signed)
Notes Recorded by Harvie Junior, CMA on 08/15/2016 at 3:55 PM EST Patient aware of results. Patient has labs with Dr.Gessner Monday they are drawing a cbc will have labs faxed here. ------  Notes Recorded by Larey Dresser, MD on 08/15/2016 at 12:21 AM EST hgb fairly stable, repeat CBC 1 week.    Ref Range & Units 1d ago 2d ago 3wk ago   WBC 4.0 - 10.5 K/uL 6.1  7.8  4.4    RBC 4.22 - 5.81 MIL/uL 2.77   2.87   3.73     Hemoglobin 13.0 - 17.0 g/dL 8.5   8.9   11.7     HCT 39.0 - 52.0 % 26.1   27.9   35.4     MCV 78.0 - 100.0 fL 94.2  97.2  94.9    MCH 26.0 - 34.0 pg 30.7  31.0  31.4    MCHC 30.0 - 36.0 g/dL 32.6  31.9  33.1    RDW 11.5 - 15.5 % 15.1  14.6  14.8    Platelets 150 - 400 K/uL 127   129   136     Neutrophils Relative %   71  63    Basophils Absolute   0.0  0.0    Neutro Abs   5.6  2.8    Lymphocytes Relative   19  22    Lymphs Abs   1.5  1.0    Monocytes Relative   9  12    Monocytes Absolute   0.7  0.5    Eosinophils Relative   1  2    Eosinophils Absolute   0.1  0.1    Basophils Relative   0  1

## 2016-08-15 NOTE — Telephone Encounter (Signed)
-----   Message from Larey Dresser, MD sent at 08/15/2016 12:21 AM EST ----- hgb fairly stable, repeat CBC 1 week.

## 2016-08-15 NOTE — Progress Notes (Signed)
He is aware of these and subsequent results - I spoke to him yesterday - see phone note  Please get an update on bleeding and overall sxs

## 2016-08-16 LAB — CUP PACEART REMOTE DEVICE CHECK
Battery Remaining Longevity: 95 mo
Brady Statistic AP VS Percent: 1.58 %
Brady Statistic AS VP Percent: 0.38 %
Brady Statistic AS VS Percent: 0.03 %
Date Time Interrogation Session: 20171023175942
HighPow Impedance: 70 Ohm
Implantable Lead Implant Date: 20160607
Implantable Pulse Generator Implant Date: 20160607
Lead Channel Impedance Value: 399 Ohm
Lead Channel Impedance Value: 399 Ohm
Lead Channel Impedance Value: 494 Ohm
Lead Channel Impedance Value: 494 Ohm
Lead Channel Impedance Value: 646 Ohm
Lead Channel Impedance Value: 760 Ohm
Lead Channel Impedance Value: 836 Ohm
Lead Channel Impedance Value: 893 Ohm
Lead Channel Pacing Threshold Amplitude: 0.625 V
Lead Channel Pacing Threshold Amplitude: 0.625 V
Lead Channel Pacing Threshold Pulse Width: 0.4 ms
Lead Channel Pacing Threshold Pulse Width: 0.4 ms
Lead Channel Pacing Threshold Pulse Width: 0.4 ms
Lead Channel Sensing Intrinsic Amplitude: 1.625 mV
Lead Channel Sensing Intrinsic Amplitude: 15 mV
Lead Channel Setting Pacing Amplitude: 1.5 V
Lead Channel Setting Pacing Pulse Width: 0.4 ms
Lead Channel Setting Pacing Pulse Width: 0.4 ms
Lead Channel Setting Sensing Sensitivity: 0.3 mV
MDC IDC LEAD IMPLANT DT: 20160607
MDC IDC LEAD IMPLANT DT: 20160607
MDC IDC LEAD LOCATION: 753858
MDC IDC LEAD LOCATION: 753859
MDC IDC LEAD LOCATION: 753860
MDC IDC LEAD MODEL: 4598
MDC IDC MSMT BATTERY VOLTAGE: 3 V
MDC IDC MSMT LEADCHNL LV IMPEDANCE VALUE: 513 Ohm
MDC IDC MSMT LEADCHNL LV IMPEDANCE VALUE: 532 Ohm
MDC IDC MSMT LEADCHNL LV IMPEDANCE VALUE: 836 Ohm
MDC IDC MSMT LEADCHNL LV IMPEDANCE VALUE: 969 Ohm
MDC IDC MSMT LEADCHNL RA SENSING INTR AMPL: 1.625 mV
MDC IDC MSMT LEADCHNL RV IMPEDANCE VALUE: 399 Ohm
MDC IDC MSMT LEADCHNL RV PACING THRESHOLD AMPLITUDE: 0.75 V
MDC IDC MSMT LEADCHNL RV SENSING INTR AMPL: 15 mV
MDC IDC SET LEADCHNL LV PACING AMPLITUDE: 2.25 V
MDC IDC SET LEADCHNL RV PACING AMPLITUDE: 2 V
MDC IDC STAT BRADY AP VP PERCENT: 98 %
MDC IDC STAT BRADY RA PERCENT PACED: 99.58 %
MDC IDC STAT BRADY RV PERCENT PACED: 4.27 %

## 2016-08-17 ENCOUNTER — Ambulatory Visit: Payer: Medicare Other | Admitting: Cardiovascular Disease

## 2016-08-20 ENCOUNTER — Other Ambulatory Visit (HOSPITAL_COMMUNITY)
Admission: RE | Admit: 2016-08-20 | Discharge: 2016-08-20 | Disposition: A | Discharge status: Home / Self Care | Payer: Medicare Other | Source: Ambulatory Visit | Attending: Internal Medicine | Admitting: Internal Medicine

## 2016-08-20 ENCOUNTER — Ambulatory Visit (HOSPITAL_COMMUNITY)
Admission: RE | Admit: 2016-08-20 | Discharge: 2016-08-20 | Disposition: A | Discharge status: Home / Self Care | Payer: Medicare Other | Source: Ambulatory Visit | Attending: Hematology & Oncology | Admitting: Hematology & Oncology

## 2016-08-20 DIAGNOSIS — R938 Abnormal findings on diagnostic imaging of other specified body structures: Secondary | ICD-10-CM | POA: Diagnosis not present

## 2016-08-20 DIAGNOSIS — K625 Hemorrhage of anus and rectum: Secondary | ICD-10-CM | POA: Insufficient documentation

## 2016-08-20 DIAGNOSIS — R9389 Abnormal findings on diagnostic imaging of other specified body structures: Secondary | ICD-10-CM

## 2016-08-20 LAB — CBC WITH DIFFERENTIAL/PLATELET
BASOS ABS: 0 10*3/uL (ref 0.0–0.1)
BASOS PCT: 1 %
Eosinophils Absolute: 0.1 10*3/uL (ref 0.0–0.7)
Eosinophils Relative: 1 %
HEMATOCRIT: 26.8 % — AB (ref 39.0–52.0)
HEMOGLOBIN: 8.7 g/dL — AB (ref 13.0–17.0)
LYMPHS PCT: 21 %
Lymphs Abs: 1.3 10*3/uL (ref 0.7–4.0)
MCH: 31.6 pg (ref 26.0–34.0)
MCHC: 32.5 g/dL (ref 30.0–36.0)
MCV: 97.5 fL (ref 78.0–100.0)
MONO ABS: 0.3 10*3/uL (ref 0.1–1.0)
Monocytes Relative: 5 %
NEUTROS ABS: 4.2 10*3/uL (ref 1.7–7.7)
NEUTROS PCT: 72 %
Platelets: 143 10*3/uL — ABNORMAL LOW (ref 150–400)
RBC: 2.75 MIL/uL — ABNORMAL LOW (ref 4.22–5.81)
RDW: 15.9 % — AB (ref 11.5–15.5)
WBC: 5.9 10*3/uL (ref 4.0–10.5)

## 2016-08-20 LAB — FERRITIN: Ferritin: 81 ng/mL (ref 24–336)

## 2016-08-20 NOTE — Progress Notes (Signed)
Hemoglobin is same  As long as no more bleeding plan is to restart Xarelto today F/u Dr. Whitney Muse re: anemia

## 2016-08-29 ENCOUNTER — Ambulatory Visit (INDEPENDENT_AMBULATORY_CARE_PROVIDER_SITE_OTHER): Payer: Medicare Other

## 2016-08-29 ENCOUNTER — Telehealth: Payer: Self-pay | Admitting: Cardiology

## 2016-08-29 DIAGNOSIS — Z95810 Presence of automatic (implantable) cardiac defibrillator: Secondary | ICD-10-CM | POA: Diagnosis not present

## 2016-08-29 DIAGNOSIS — I5022 Chronic systolic (congestive) heart failure: Secondary | ICD-10-CM | POA: Diagnosis not present

## 2016-08-29 NOTE — Telephone Encounter (Signed)
Spoke with pt and reminded pt of remote transmission that is due today. Pt verbalized understanding.   

## 2016-08-30 NOTE — Progress Notes (Signed)
EPIC Encounter for ICM Monitoring  Patient Name: Chad Brown is a 75 y.o. male Date: 08/30/2016 Primary Care Physican: Sherrie Mustache, MD Primary Cardiologist:Nishan/McLean Electrophysiologist: Allred Dry GSUPJS:315 lbs Bi-V Pacing: 97.9%      Heart Failure questions reviewed, pt symptomatic with weight gain  Thoracic impedance abnormal suggesting fluid accumulation.  Recommendations: Increase Furosemide 20 mg to 2 tablets every am and 1 tablet pm x 2 days (patient has already taken extra dose today).  Advised to take Potassium 20 meq 1 tablet twice a day as prescribed.  He has only been taking it once a day. Reinforced low salt food choices and limiting fluid intake to < 2 liters per day. Encouraged to call for fluid symptoms.    Follow-up plan: ICM clinic phone appointment on 09/07/2016.  Copy of ICM check sent to primary cardiologist and device physician.   ICM trend: 08/29/2016       Rosalene Billings, RN 08/30/2016 5:02 PM

## 2016-09-02 NOTE — Progress Notes (Signed)
Check in with him to see how he is doing.  If still short of breath, keep Lasix 40 qam/20 qpm with followup in office with PA/NP.

## 2016-09-05 ENCOUNTER — Telehealth: Payer: Self-pay | Admitting: Cardiovascular Disease

## 2016-09-05 NOTE — Telephone Encounter (Signed)
Left message for patient to call back  

## 2016-09-05 NOTE — Telephone Encounter (Signed)
Ok to hold xarelto till they figure out hematuria

## 2016-09-05 NOTE — Telephone Encounter (Signed)
Called patient and informed him of Dr. Nishan's recommendations. Patient verbalized understanding. 

## 2016-09-05 NOTE — Telephone Encounter (Signed)
New message  Pt is calling in regards to being taken off of xarelto per pcp due to blood in urine, Chad Brown,has appt [facility]  Please call back

## 2016-09-05 NOTE — Telephone Encounter (Signed)
Patient saw his PCP today due to having blood in his urine the last two days. Patient stated he had more yesterday, than today. Patient stated the PCP wants to take him off of xarelto, patient told his PCP he would have to talk to his cardiologist. Patient also is seeing his urologist on Friday. Will forward to [place].

## 2016-09-06 ENCOUNTER — Other Ambulatory Visit (HOSPITAL_COMMUNITY): Payer: Self-pay

## 2016-09-06 ENCOUNTER — Other Ambulatory Visit (HOSPITAL_COMMUNITY): Payer: Self-pay | Admitting: Hematology & Oncology

## 2016-09-06 DIAGNOSIS — C4490 Unspecified malignant neoplasm of skin, unspecified: Secondary | ICD-10-CM

## 2016-09-06 DIAGNOSIS — D649 Anemia, unspecified: Secondary | ICD-10-CM

## 2016-09-07 ENCOUNTER — Telehealth: Payer: Self-pay | Admitting: Cardiology

## 2016-09-07 NOTE — Telephone Encounter (Signed)
Spoke with pt and reminded pt of remote transmission that is due today. Pt verbalized understanding.   

## 2016-09-07 NOTE — Telephone Encounter (Signed)
Follow Up:    Pt says he can not transmit,his land line is out of order,

## 2016-09-10 NOTE — Telephone Encounter (Signed)
Spoke w/ pt and informed him that once his landline phone to send the transmission at that time. Pt verbalized understanding.

## 2016-09-11 ENCOUNTER — Encounter (HOSPITAL_COMMUNITY): Payer: Medicare Other

## 2016-09-11 ENCOUNTER — Telehealth: Payer: Self-pay

## 2016-09-11 ENCOUNTER — Encounter (HOSPITAL_COMMUNITY): Payer: Medicare Other | Attending: Hematology & Oncology

## 2016-09-11 VITALS — BP 130/68 | HR 68 | Temp 98.2°F | Resp 18

## 2016-09-11 DIAGNOSIS — R195 Other fecal abnormalities: Secondary | ICD-10-CM

## 2016-09-11 DIAGNOSIS — D500 Iron deficiency anemia secondary to blood loss (chronic): Secondary | ICD-10-CM | POA: Diagnosis not present

## 2016-09-11 DIAGNOSIS — D649 Anemia, unspecified: Secondary | ICD-10-CM | POA: Diagnosis not present

## 2016-09-11 LAB — CBC WITH DIFFERENTIAL/PLATELET
BASOS PCT: 1 %
Basophils Absolute: 0 10*3/uL (ref 0.0–0.1)
Eosinophils Absolute: 0.2 10*3/uL (ref 0.0–0.7)
Eosinophils Relative: 4 %
HEMATOCRIT: 30.5 % — AB (ref 39.0–52.0)
Hemoglobin: 9.5 g/dL — ABNORMAL LOW (ref 13.0–17.0)
LYMPHS PCT: 23 %
Lymphs Abs: 0.8 10*3/uL (ref 0.7–4.0)
MCH: 30.5 pg (ref 26.0–34.0)
MCHC: 31.1 g/dL (ref 30.0–36.0)
MCV: 98.1 fL (ref 78.0–100.0)
MONO ABS: 0.7 10*3/uL (ref 0.1–1.0)
MONOS PCT: 19 %
NEUTROS ABS: 1.8 10*3/uL (ref 1.7–7.7)
Neutrophils Relative %: 53 %
Platelets: 129 10*3/uL — ABNORMAL LOW (ref 150–400)
RBC: 3.11 MIL/uL — ABNORMAL LOW (ref 4.22–5.81)
RDW: 14.5 % (ref 11.5–15.5)
WBC: 3.4 10*3/uL — ABNORMAL LOW (ref 4.0–10.5)

## 2016-09-11 LAB — FERRITIN: Ferritin: 49 ng/mL (ref 24–336)

## 2016-09-11 MED ORDER — SODIUM CHLORIDE 0.9 % IV SOLN
INTRAVENOUS | Status: DC
  Administered 2016-09-11: 14:00:00 via INTRAVENOUS

## 2016-09-11 MED ORDER — SODIUM CHLORIDE 0.9 % IV SOLN
510.0000 mg | Freq: Once | INTRAVENOUS | Status: AC
  Administered 2016-09-11: 510 mg via INTRAVENOUS
  Filled 2016-09-11: qty 17

## 2016-09-11 NOTE — Telephone Encounter (Signed)
Received call from patient.  He stated the remote monitor is not working and continues to get an error code.  Advised he would need to call [employer] to assist with troubleshooting.  He reported he has talked to [employer] a couple of times but it still is not working.  He reported feeling fine and denied any shortness of breath or other fluid symptoms.  Requested he call if he is able to send remote transmission so it can be reviewed.

## 2016-09-11 NOTE — Patient Instructions (Signed)
[facility] at [facility] Discharge Instructions  RECOMMENDATIONS MADE BY THE CONSULTANT AND ANY TEST RESULTS WILL BE SENT TO YOUR REFERRING PHYSICIAN.  Received Feraheme today. Follow-up as scheduled. Call clinic for any questions or concerns  Thank you for choosing [facility] at [facility] to provide your oncology and hematology care.  To afford each patient quality time with our provider, please arrive at least 15 minutes before your scheduled appointment time.   Beginning January 23rd 2017 lab work for the Cancer Center will be done in the  Main lab at [facility] on 1st floor. If you have a lab appointment with the Cancer Center please come in thru the  Main Entrance and check in at the main information desk  You need to re-schedule your appointment should you arrive 10 or more minutes late.  We strive to give you quality time with our providers, and arriving late affects you and other patients whose appointments are after yours.  Also, if you no show three or more times for appointments you may be dismissed from the clinic at the providers discretion.     Again, thank you for choosing [facility].  Our hope is that these requests will decrease the amount of time that you wait before being seen by our physicians.       _____________________________________________________________  Should you have questions after your visit to  Cancer Center, please contact our office at [phone] between the hours of 8:30 a.m. and 4:30 p.m.  Voicemails left after 4:30 p.m. will not be returned until the following business day.  For prescription refill requests, have your pharmacy contact our office.         Resources For Cancer Patients and their Caregivers ? American Cancer Society: Can assist with transportation, wigs, general needs, runs Look Good Feel Better.        1-888-227-6333 ? Cancer Care: Provides financial  assistance, online support groups, medication/co-pay assistance.  1-800-813-HOPE (4673) ? [facility] Assists [place] cancer patients and their families through emotional , educational and financial support.  336-427-4357 ? [place] DSS Where to apply for food stamps, Medicaid and utility assistance. 336-342-1394 ? [facility]: Transportation to medical appointments. 336-347-2287 ? Social Security Administration: May apply for disability if have a Stage IV cancer. 336-342-7796 1-800-772-1213 ? [place] Aging, Disability and Transit Services: Assists with nutrition, care and transit needs. 336-349-2343  Cancer Center Support Programs: @[email or web address]@ > Cancer Support Group  2nd Tuesday of the month 1pm-2pm, Journey Room  > Creative Journey  3rd Tuesday of the month 1130am-1pm, Journey Room  > Look Good Feel Better  1st Wednesday of the month 10am-12 noon, Journey Room (Call American Cancer Society to register 1-800-395-5775)   

## 2016-09-11 NOTE — Progress Notes (Signed)
Chad Brown tolerated Feraheme well without complaints or incident. VSS upon discharge. Pt discharged self ambulatory in satisfactory condition accompanied by wife

## 2016-09-13 ENCOUNTER — Ambulatory Visit (HOSPITAL_COMMUNITY): Payer: Medicare Other

## 2016-09-13 ENCOUNTER — Other Ambulatory Visit (HOSPITAL_COMMUNITY): Payer: Medicare Other

## 2016-10-02 ENCOUNTER — Telehealth: Payer: Self-pay | Admitting: Cardiology

## 2016-10-02 NOTE — Progress Notes (Signed)
No ICM remote transmission received on 10/02/2016 due to monitor is not working.  New monitor on order.   Next ICM transmission scheduled for 11/20/2016 since patient has office defib check with Dr Rayann Heman 10/17/2016.

## 2016-10-02 NOTE — Telephone Encounter (Signed)
Spoke with pt and reminded pt of remote transmission that is due today. Pt verbalized understanding and said he had to order a new monitor and he will have it in about 3 days.

## 2016-10-04 NOTE — Progress Notes (Signed)
Patient ID: Chad Brown, male   DOB: Dec 31, 1940, 76 y.o.   MRN: [id number] PCP: [facility] Primary Cardiologist: Johnsie Cancel HF Cardiologist: Aundra Dubin  76 yo with paroxysmal atrial fibrillation and chronic systolic CHF thought to be due to nonischemic cardiomyopathy presents for CHF clinic evaluation. He has a cardiac history dating back to 1998, when he was admitted with chest pain and had cardiac cath showing nonobstructive CAD. In 4/14, he was found to be in atrial fibrillation.  Echo showed EF 30-35%.  He had DCCV back to NSR.  He was back in atrial fibrillation in 1/16, and EF was low again on TEE at that time.   Again, he had DCCV.  In 5/16, cardiac MRI showed persistently low EF, so given LBBB, he had Medtronic CRT-D device placed.  He was back in atrial fibrillation in 6/16 and had TEE-guided DCCV again with EF now 15-20% on TEE.  Repeat echo in 2/17 showed EF 55-60%.   08/07/16  he had colonoscopy with removal of polyps.  He restarted Xarelto the next day.  A few days later, he had profuse BRBPR.  xarelto held and resolved last Hct 09/11/16 30.5   Seen by urology for hematuria 09/07/16 UA sent and started on bactrim To have cystoscopy Dr Tresa Moore Has had cough for 2 weeks on antibiotics   Optivol: fluid index < threshold, impedance stable.  2 hrs/day activity.  1 very brief atrial fibrillation run, otherwise NSR, > 99% BiV paced.   PMH: 1. Atrial fibrillation: Paroxysmal.  Diagnosed 4/14, DCCV 4/14 to NSR.  DCCV 1/16 to NSR.  DCCV 6/16 to NSR.  2. Chronic systolic CHF: Nonischemic cardiomyopathy.  LHC in 1998 with nonobstructive disease.  Cardiolite in 6/14 with no ischemia or infarction.  cMRI (5/16) with EF 34%, mildly dilated LV with diffuse HK worse in anterolateral wall, small punctate areas of LGE in anteroseptum and basal inferior wall (not CAD pattern).  TEE (6/16) with EF 15-20%.  He has Medtronic CRT-D device.  Echo (9/16) with EF 40%, moderate LV dilation, grade II diastolic dysfunction, normal  RV size and systolic function, PASP 42 mmHg.  - Hypotension with Entresto.  - Echo (2/17) showed LV functional recovery, EF 55-60% with mild MR.                                                                                                3. Hyperlipidemia: Myalgias with atorvastatin, Livalo, and pravastatin.  4. CKD 5. OA: s/p THR.  6. H/o melanoma 7. Anemia 8. Diverticulosis 9. OSA: Uses CPAP.   SH: Married with 3 children, lives in [place], retired, quit smoking in 2001.    FH: Mother with MI  ROS: All systems reviewed and negative except as per HPI.  Current Outpatient Prescriptions  Medication Sig Dispense Refill  . acetaminophen (TYLENOL) 500 MG tablet Take 1,000 mg by mouth every 4 (four) hours as needed for moderate pain or headache.    [name] amiodarone (PACERONE) 200 MG tablet Take 1 tablet (200 mg total) by mouth daily. 30 tablet 11  . carvedilol (COREG) 12.5 MG tablet Take 1  tablet (12.5 mg total) by mouth 2 (two) times daily with a meal. 180 tablet 3  . dorzolamide-timolol (COSOPT) 22.3-6.8 MG/ML ophthalmic solution Place 1 drop into the right eye 2 (two) times daily.    [name] escitalopram (LEXAPRO) 20 MG tablet Take 20 mg by mouth daily.    [name] ezetimibe (ZETIA) 10 MG tablet Take 1 tablet (10 mg total) by mouth daily. 90 tablet 3  . finasteride (PROSCAR) 5 MG tablet Take 5 mg by mouth every morning.     . furosemide (LASIX) 20 MG tablet Take 2 tablets (40 mg total) by mouth daily. 180 tablet 3  . gabapentin (NEURONTIN) 100 MG capsule TAKE 1 TAB BY MOUTH IN THE AM AND 2 TABS BY MOUTH IN THE PM    . losartan (COZAAR) 50 MG tablet Take 25-50 mg by mouth as directed.    . niacin (NIASPAN) 500 MG CR tablet Take 500 mg by mouth 2 (two) times daily.    [name] Glycol-Propyl Glycol (SYSTANE ULTRA OP) Place 1 drop into the left eye 2 (two) times daily.     . potassium chloride SA (K-DUR,KLOR-CON) 20 MEQ tablet Take 1 tablet (20 mEq total) by mouth 2 (two) times daily. 135 tablet 3   . rivaroxaban (XARELTO) 20 MG TABS tablet Take 1 tablet (20 mg total) by mouth daily with supper. RESTART 08/11/16 90 tablet 3  . spironolactone (ALDACTONE) 25 MG tablet Take 0.5 tablets (12.5 mg total) by mouth daily. 45 tablet 3  . tamsulosin (FLOMAX) 0.4 MG CAPS Take 0.4 mg by mouth at bedtime.      No current facility-administered medications for this visit.    BP 140/86   Pulse 66   Ht 5' 6.5" (1.689 m)   Wt 225 lb 6.4 oz (102.2 kg)   SpO2 96%   BMI 35.84 kg/m  General: NAD Neck: JVP 7 cm, no thyromegaly or thyroid nodule.  Lungs: rhonchi right lung base . CV: Nondisplaced PMI.  Heart regular S1/S2, no S3/S4, no murmur.  Trace ankle edema.  No carotid bruit.  Normal pedal pulses.  Abdomen: Soft, nontender, no hepatosplenomegaly, no distention.  Skin: Intact without lesions or rashes.  Neurologic: Alert and oriented x 3.  Psych: Normal affect. Extremities: No clubbing or cyanosis.  HEENT: Normal.   Assessment/Plan: 1. Chronic systolic CHF: Suspect nonischemic cardiomyopathy.  EF 15-20% on 6/16 TEE.  Cardiac MRI in 5/16 showed a LGE pattern that was not suggestive of coronary disease (?myocarditis or infiltrative disease).  There may be a component of tachycardia-mediated cardiomyopathy.  However, the cardiac MRI appears to have been done when he was in NSR.  In 9/16, echo showed EF increased to 40%.  Finally, in 2/17 repeat echo showed EF up to 55-60%.  He has Medtronic CRT-D device.  NYHA class II symptoms with good volume status by exam and Optivol.   - Continue current Lasix.  - Continue current losartan and Coreg.  Continue spironolactone 12.5 daily.  2. Atrial fibrillation: Seems to tolerate poorly.  He is in NSR today.  Device interrogation showed only 1 very brief afib run.  - Continue Xarelto 20 daily.  - Continue amiodarone 200 mg daily. Recent LFTs normal.  He should have regular eye exams.  Check TSH today.   3. Hyperlipidemia: LDL remains quite high on Zetia.  He  cannot take statins.   4. Post-colonoscopy bleeding: resolved  5. Cough:  Abnormal right lung exam f/u CXR today continue antibiotics likely bronchitis does not  appear toxic   Followup in 6 months   [name] 10/08/2016 10:19 AM

## 2016-10-08 ENCOUNTER — Encounter (HOSPITAL_COMMUNITY): Payer: Medicare Other | Attending: Hematology & Oncology

## 2016-10-08 ENCOUNTER — Ambulatory Visit (INDEPENDENT_AMBULATORY_CARE_PROVIDER_SITE_OTHER)
Admission: RE | Admit: 2016-10-08 | Discharge: 2016-10-08 | Disposition: A | Discharge status: Home / Self Care | Payer: Medicare Other | Source: Ambulatory Visit | Attending: Cardiovascular Disease | Admitting: Cardiovascular Disease

## 2016-10-08 ENCOUNTER — Encounter: Payer: Self-pay | Admitting: Cardiovascular Disease

## 2016-10-08 ENCOUNTER — Ambulatory Visit (INDEPENDENT_AMBULATORY_CARE_PROVIDER_SITE_OTHER): Payer: Medicare Other | Admitting: Cardiovascular Disease

## 2016-10-08 ENCOUNTER — Telehealth: Payer: Self-pay | Admitting: *Deleted

## 2016-10-08 VITALS — BP 140/86 | HR 66 | Ht 66.5 in | Wt 225.4 lb

## 2016-10-08 DIAGNOSIS — R059 Cough, unspecified: Secondary | ICD-10-CM

## 2016-10-08 DIAGNOSIS — R05 Cough: Secondary | ICD-10-CM

## 2016-10-08 DIAGNOSIS — I5022 Chronic systolic (congestive) heart failure: Secondary | ICD-10-CM

## 2016-10-08 DIAGNOSIS — D649 Anemia, unspecified: Secondary | ICD-10-CM | POA: Diagnosis not present

## 2016-10-08 DIAGNOSIS — D696 Thrombocytopenia, unspecified: Secondary | ICD-10-CM

## 2016-10-08 LAB — CBC WITH DIFFERENTIAL/PLATELET
BASOS PCT: 0 %
Basophils Absolute: 0 10*3/uL (ref 0.0–0.1)
EOS ABS: 0.1 10*3/uL (ref 0.0–0.7)
EOS PCT: 2 %
HCT: 33.1 % — ABNORMAL LOW (ref 39.0–52.0)
Hemoglobin: 11.2 g/dL — ABNORMAL LOW (ref 13.0–17.0)
LYMPHS ABS: 1.4 10*3/uL (ref 0.7–4.0)
Lymphocytes Relative: 28 %
MCH: 32.3 pg (ref 26.0–34.0)
MCHC: 33.8 g/dL (ref 30.0–36.0)
MCV: 95.4 fL (ref 78.0–100.0)
MONOS PCT: 10 %
Monocytes Absolute: 0.5 10*3/uL (ref 0.1–1.0)
Neutro Abs: 3.1 10*3/uL (ref 1.7–7.7)
Neutrophils Relative %: 60 %
PLATELETS: 154 10*3/uL (ref 150–400)
RBC: 3.47 MIL/uL — ABNORMAL LOW (ref 4.22–5.81)
RDW: 14.8 % (ref 11.5–15.5)
WBC: 5.1 10*3/uL (ref 4.0–10.5)

## 2016-10-08 LAB — FERRITIN: FERRITIN: 229 ng/mL (ref 24–336)

## 2016-10-08 NOTE — Telephone Encounter (Signed)
DPR for pt's wife who has been notified of CXR results by phone with verbal understanding. I stated if pt has any questions feel free to call the office. Pt thanked me for the call.

## 2016-10-08 NOTE — Patient Instructions (Addendum)
Medication Instructions:  Your physician recommends that you continue on your current medications as directed. Please refer to the Current Medication list given to you today.  Labwork: NONE  Testing/Procedures: Your physician has requested that you have an echocardiogram. Echocardiography is a painless test that uses sound waves to create images of your heart. It provides your doctor with information about the size and shape of your heart and how well your heart's chambers and valves are working. This procedure takes approximately one hour. There are no restrictions for this procedure.  A chest x-ray takes a picture of the organs and structures inside the chest, including the heart, lungs, and blood vessels. This test can show several things, including, whether the heart is enlarges; whether fluid is building up in the lungs; and whether pacemaker / defibrillator leads are still in place.   Follow-Up: Your physician wants you to follow-up in: 6 months with Dr. Johnsie Cancel. You will receive a reminder letter in the mail two months in advance. If you don't receive a letter, please call our office to schedule the follow-up appointment.   If you need a refill on your cardiac medications before your next appointment, please call your pharmacy.

## 2016-10-09 ENCOUNTER — Other Ambulatory Visit (HOSPITAL_COMMUNITY): Payer: Medicare Other

## 2016-10-09 ENCOUNTER — Ambulatory Visit (HOSPITAL_COMMUNITY): Payer: Medicare Other | Attending: Internal Medicine

## 2016-10-09 ENCOUNTER — Other Ambulatory Visit: Payer: Self-pay

## 2016-10-09 DIAGNOSIS — R9439 Abnormal result of other cardiovascular function study: Secondary | ICD-10-CM | POA: Insufficient documentation

## 2016-10-09 DIAGNOSIS — I340 Nonrheumatic mitral (valve) insufficiency: Secondary | ICD-10-CM | POA: Insufficient documentation

## 2016-10-09 DIAGNOSIS — I361 Nonrheumatic tricuspid (valve) insufficiency: Secondary | ICD-10-CM | POA: Diagnosis not present

## 2016-10-09 DIAGNOSIS — I358 Other nonrheumatic aortic valve disorders: Secondary | ICD-10-CM | POA: Diagnosis not present

## 2016-10-09 DIAGNOSIS — I5022 Chronic systolic (congestive) heart failure: Secondary | ICD-10-CM | POA: Diagnosis not present

## 2016-10-11 ENCOUNTER — Other Ambulatory Visit (HOSPITAL_COMMUNITY): Payer: Medicare Other

## 2016-10-17 ENCOUNTER — Encounter: Payer: Medicare Other | Admitting: Internal Medicine

## 2016-10-22 ENCOUNTER — Ambulatory Visit (INDEPENDENT_AMBULATORY_CARE_PROVIDER_SITE_OTHER): Payer: Medicare Other

## 2016-10-22 DIAGNOSIS — Z95810 Presence of automatic (implantable) cardiac defibrillator: Secondary | ICD-10-CM

## 2016-10-22 DIAGNOSIS — I5022 Chronic systolic (congestive) heart failure: Secondary | ICD-10-CM | POA: Diagnosis not present

## 2016-10-22 NOTE — Progress Notes (Signed)
EPIC Encounter for ICM Monitoring  Patient Name: Chad Brown is a 75 y.o. male Date: 10/22/2016 Primary Care Physican: NYLAND,LEONARD ROBERT, MD Primary Cardiologist:Nishan/McLean Electrophysiologist: Allred Dry Weight:224 lbs Bi-V Pacing: 98.1%       Heart Failure questions reviewed, pt asymptomatic.  Patient has new home monitor.    Thoracic impedance normal.  Labs: 07/25/2016 Creatinine 1.29, BUN 31, Potassium 4.2, Sodium 136, EGFR 53->60 05/21/2016 Creatinine 1.24, BUN 16, Potassium 4.2, Sodium 138, EGFR 55->60  03/19/2016 Creatinine 1.09, BUN 15, Potassium 4.6, Sodium 137, EGFR >60  01/19/2016 Creatinine 1.15, BUN 10, Potassium 4.2, Sodium 143, EGFR >60  11/04/2015 Creatinine 1.23, BUN 14, Potassium 4.0, Sodium 142, EGFR 56->60   Recommendations: No changes. Discussed limiting dietary salt intake to 2000 mg/day and fluid intake to < 2 liters per day. Encouraged to call for fluid symptoms.  Follow-up plan: ICM clinic phone appointment on 12/18/2016.  Defib check in office with Dr Allred on 11/14/2016  Copy of ICM check sent to device physician.   3 month ICM trend: 10/17/2016   1 Year ICM trend:     Laurie S Short, RN 10/22/2016 12:20 PM    

## 2016-11-02 DIAGNOSIS — N400 Enlarged prostate without lower urinary tract symptoms: Secondary | ICD-10-CM | POA: Insufficient documentation

## 2016-11-12 ENCOUNTER — Other Ambulatory Visit: Payer: Self-pay | Admitting: Nurse Practitioner

## 2016-11-12 DIAGNOSIS — I4819 Other persistent atrial fibrillation: Secondary | ICD-10-CM

## 2016-11-12 DIAGNOSIS — D649 Anemia, unspecified: Secondary | ICD-10-CM

## 2016-11-12 DIAGNOSIS — C439 Malignant melanoma of skin, unspecified: Secondary | ICD-10-CM

## 2016-11-14 ENCOUNTER — Encounter: Payer: Self-pay | Admitting: Internal Medicine

## 2016-11-14 ENCOUNTER — Ambulatory Visit (INDEPENDENT_AMBULATORY_CARE_PROVIDER_SITE_OTHER): Payer: Medicare Other | Admitting: Internal Medicine

## 2016-11-14 VITALS — BP 148/87 | HR 79 | Ht 66.5 in | Wt 226.8 lb

## 2016-11-14 DIAGNOSIS — R001 Bradycardia, unspecified: Secondary | ICD-10-CM | POA: Diagnosis not present

## 2016-11-14 DIAGNOSIS — Z95810 Presence of automatic (implantable) cardiac defibrillator: Secondary | ICD-10-CM | POA: Diagnosis not present

## 2016-11-14 DIAGNOSIS — I5022 Chronic systolic (congestive) heart failure: Secondary | ICD-10-CM | POA: Diagnosis not present

## 2016-11-14 DIAGNOSIS — I481 Persistent atrial fibrillation: Secondary | ICD-10-CM

## 2016-11-14 DIAGNOSIS — C439 Malignant melanoma of skin, unspecified: Secondary | ICD-10-CM

## 2016-11-14 DIAGNOSIS — I480 Paroxysmal atrial fibrillation: Secondary | ICD-10-CM | POA: Diagnosis not present

## 2016-11-14 DIAGNOSIS — I4819 Other persistent atrial fibrillation: Secondary | ICD-10-CM

## 2016-11-14 MED ORDER — AMIODARONE HCL 200 MG PO TABS: 100.0000 mg | ORAL_TABLET | Freq: Every day | ORAL | 10 refills | Status: DC

## 2016-11-14 NOTE — Progress Notes (Signed)
Electrophysiology Office Note   Date:  11/14/2016   ID:  KNUT RONDINELLI, DOB 12-19-1940, MRN [id number]  PCP:  Sherrie Mustache, MD  Cardiologist:  Dr Johnsie Cancel Also followed in CHF clinic by Dr Aundra Dubin Primary Electrophysiologist: Thompson Grayer, MD    Chief Complaint  Patient presents with  . Atrial Fibrillation     History of Present Illness: Chad Brown is a 76 y.o. male who presents today for electrophysiology evaluation.  He is doing well.  No afib. Today, he denies symptoms of palpitations, chest pain, shortness of breath, orthopnea, PND, lower extremity edema, claudication, dizziness, presyncope, syncope, bleeding, or neurologic sequela. The patient is tolerating medications without difficulties and is otherwise without complaint today.    Past Medical History:  Diagnosis Date  . Adenomatous colon polyp    tubular  . AICD (automatic cardioverter/defibrillator) present 03/08/2015   MDT CRTD   . Anemia    iron deficient  . Arthritis    "about all my joints; hands, knees, back" (03/08/2015)  . Atherosclerosis   . Cataract    left eye small  . Cholelithiasis    gallstones  . Chronic systolic CHF (congestive heart failure) ([place])    a. New dx 12/2012 ? NICM, may be r/t afib. b. Nuc 03/2013 - normal;  c. 03/2015 TEE EF 15-20%.  . Colon polyp, hyperplastic 5/16   removed precancerous lesions  . Depression   . Diverticulosis   . Glaucoma    right eye  . Hyperlipidemia   . Hypertension   . Melanoma of eye ([place]) 2000's   "right; it's never been biopsied"  . Melanoma of lower leg ([place]) 2015   "left; right at my knee"  . Myocardial infarction 1998  . OSA (obstructive sleep apnea) 01/04/2016   uses cpap, pt does not know settings auto set  . Peripheral vision loss, right 2006  . Persistent atrial fibrillation ([place])    a. Dx 12/2012, s/p TEE/DCCV 01/26/13. b. On Xarelto (CHA2DS2VASc = 3);  c. 03/2015 TEE (EF 15-20%, no LAA thrombus) and DCCV - amio increased to 200 mg  bid.  . Urinary hesitancy due to benign prostatic hypertrophy    Past Surgical History:  Procedure Laterality Date  . BACK SURGERY     upper back, cannot turn neck well  . CARDIAC CATHETERIZATION  1998  . CARDIOVERSION N/A 01/26/2013   Procedure: CARDIOVERSION;  Surgeon: Lelon Perla, MD;  Location: [facility] ENDOSCOPY;  Service: Cardiovascular;  Laterality: N/A;  . CARDIOVERSION N/A 03/23/2015   Procedure: CARDIOVERSION;  Surgeon: Jerline Pain, MD;  Location: [facility];  Service: Cardiovascular;  Laterality: N/A;  . CATARACT EXTRACTION Right ~ 2006  . COLONOSCOPY WITH PROPOFOL N/A 02/10/2015   Procedure: COLONOSCOPY WITH PROPOFOL;  Surgeon: Gatha Mayer, MD;  Location: [facility] ENDOSCOPY;  Service: Endoscopy;  Laterality: N/A;  . COLONOSCOPY WITH PROPOFOL N/A 08/07/2016   Procedure: COLONOSCOPY WITH PROPOFOL;  Surgeon: Gatha Mayer, MD;  Location: [facility] ENDOSCOPY;  Service: Endoscopy;  Laterality: N/A;  . ENTEROSCOPY N/A 08/17/2015   Procedure: ENTEROSCOPY;  Surgeon: Gatha Mayer, MD;  Location: [facility] ENDOSCOPY;  Service: Endoscopy;  Laterality: N/A;  . EP IMPLANTABLE DEVICE N/A 03/08/2015   MDT Hillery Aldo CRT-D for nonischemic CM by Dr Rayann Heman for primary prevention  . GLAUCOMA SURGERY Right ~ 2006   "put 3 stents in to drain fluid" (03/08/2015) not successful, sent to [facility] to try to get last stent out  . HOT HEMOSTASIS N/A 08/07/2016  Procedure: HOT HEMOSTASIS (ARGON PLASMA COAGULATION/BICAP);  Surgeon: Gatha Mayer, MD;  Location: [facility] ENDOSCOPY;  Service: Endoscopy;  Laterality: N/A;  . INCISION AND DRAINAGE ABSCESS POSTERIOR CERVICALSPINE  05/2012  . JOINT REPLACEMENT    . MELANOMA EXCISION Left 2015   "lower leg; right at my knee"  . REFRACTIVE SURGERY Right ~ 2006 X 2   "twice; both done at [facility]" (03/08/2015  . SURGERY SCROTAL / TESTICULAR Right 1990's  . TEE WITHOUT CARDIOVERSION N/A 01/26/2013   Procedure: TRANSESOPHAGEAL ECHOCARDIOGRAM (TEE);  Surgeon: Lelon Perla, MD;  Location: [facility];  Service: Cardiovascular;  Laterality: N/A;  [name] anes. /   . TEE WITHOUT CARDIOVERSION N/A 10/05/2014   Procedure: TRANSESOPHAGEAL ECHOCARDIOGRAM (TEE)  with cardioversion;  Surgeon: Thayer Headings, MD;  Location[facility] ENDOSCOPY;  Service: Cardiovascular;  Laterality: N/A;  12:52 synched cardioversion at 120 joules,...afib to SR...12 lead EKG ordered.Marland KitchenMarland KitchenCardiozem d/c'ed per MD verbal order at SR  . TEE WITHOUT CARDIOVERSION N/A 03/23/2015   Procedure: TRANSESOPHAGEAL ECHOCARDIOGRAM (TEE);  Surgeon: Jerline Pain, MD;  Location[facility] ENDOSCOPY;  Service: Cardiovascular;  Laterality: N/A;  . THORACIC SPINE SURGERY  03/2000   "ground calcium deposits from upper thoracic" (01/26/2013)  . TOTAL HIP ARTHROPLASTY Right 06/2007     Current Outpatient Prescriptions  Medication Sig Dispense Refill  . acetaminophen (TYLENOL) 500 MG tablet Take 1,000 mg by mouth every 4 (four) hours as needed for moderate pain or headache.    [name] amiodarone (PACERONE) 200 MG tablet Take 1 tablet (200 mg total) by mouth daily. 30 tablet 10  . carvedilol (COREG) 12.5 MG tablet Take 1 tablet (12.5 mg total) by mouth 2 (two) times daily with a meal. 180 tablet 3  . dorzolamide-timolol (COSOPT) 22.3-6.8 MG/ML ophthalmic solution Place 1 drop into the right eye 2 (two) times daily.    [name] escitalopram (LEXAPRO) 20 MG tablet Take 20 mg by mouth daily.    [name] ezetimibe (ZETIA) 10 MG tablet Take 1 tablet (10 mg total) by mouth daily. 90 tablet 3  . finasteride (PROSCAR) 5 MG tablet Take 5 mg by mouth every morning.     . furosemide (LASIX) 20 MG tablet Take 2 tablets (40 mg total) by mouth daily. 180 tablet 3  . gabapentin (NEURONTIN) 100 MG capsule TAKE 1 TAB BY MOUTH IN THE AM AND 2 TABS BY MOUTH IN THE PM    . losartan (COZAAR) 50 MG tablet Take 25-50 mg by mouth as directed.    . niacin (NIASPAN) 500 MG CR tablet Take 500 mg by mouth 2 (two) times daily.    [name] Glycol-Propyl Glycol (SYSTANE ULTRA OP) Place 1 drop into  the left eye 2 (two) times daily.     . potassium chloride SA (K-DUR,KLOR-CON) 20 MEQ tablet Take 1 tablet (20 mEq total) by mouth 2 (two) times daily. 135 tablet 3  . rivaroxaban (XARELTO) 20 MG TABS tablet Take 1 tablet (20 mg total) by mouth daily with supper. RESTART 08/11/16 90 tablet 3  . spironolactone (ALDACTONE) 25 MG tablet Take 0.5 tablets (12.5 mg total) by mouth daily. 45 tablet 3  . tamsulosin (FLOMAX) 0.4 MG CAPS Take 0.4 mg by mouth at bedtime.      No current facility-administered medications for this visit.     Allergies:   Pravastatin sodium and Zithromax [azithromycin]   Social History:  The patient  reports that he quit smoking about 16 years ago. His smoking use included Cigarettes. He has a  144.00 pack-year smoking history. He has never used smokeless tobacco. He reports that he does not drink alcohol or use drugs.   Family History:  The patient's family history includes Colon cancer in his paternal uncle; Diabetes in his maternal aunt, maternal grandmother, maternal uncle, and mother; Heart attack in his maternal uncle; Heart disease in his mother; Kidney disease in his mother; Leukemia in his father; Lung cancer in his paternal uncle; Prostate cancer in his paternal uncle.    ROS:  Please see the history of present illness.   All other systems are reviewed and negative.    PHYSICAL EXAM: VS:  BP (!) 148/87   Pulse 79   Ht 5' 6.5" (1.689 m)   Wt 226 lb 12.8 oz (102.9 kg)   BMI 36.06 kg/m  , BMI Body mass index is 36.06 kg/m. GEN: Well nourished, well developed, in no acute distress  HEENT: normal  Neck: no JVD, carotid bruits, or masses Cardiac: RRR; no murmurs, rubs, or gallops,no edema  Respiratory:  clear to auscultation bilaterally, normal work of breathing GI: soft, nontender, nondistended, + BS MS: no deformity or atrophy  Skin: warm and dry, device pocket is well healed Neuro:  Strength and sensation are intact Psych: euthymic mood, full  affect  EKG:  EKG is ordered today. The ekg ordered today shows AV paced  Device interrogation is reviewed today in detail.  See PaceArt for details.   Recent Labs: 07/25/2016: ALT 19; BUN 11; Creatinine, Ser 1.29; Potassium 4.2; Sodium 136 08/14/2016: TSH 1.637 10/08/2016: Hemoglobin 11.2; Platelets 154    Lipid Panel     Component Value Date/Time   CHOL 200 03/22/2015 0536   TRIG 105 03/22/2015 0536   HDL 26 (L) 03/22/2015 0536   CHOLHDL 7.7 03/22/2015 0536   VLDL 21 03/22/2015 0536   LDLCALC 153 (H) 03/22/2015 0536     Wt Readings from Last 3 Encounters:  11/14/16 226 lb 12.8 oz (102.9 kg)  10/08/16 225 lb 6.4 oz (102.2 kg)  08/14/16 228 lb (103.4 kg)      ASSESSMENT AND PLAN:  1.  Nonischemic CM Normal BiV ICD function See Pace Art report No changes today  2. Persistent atrial fibrillation Continue to xarelto Well controlled Will reduce amiodarone to 100mg  daily.  If he has further afib, will consider increasing amiodarone vs ablation.  3. HTN Above goal He reports good control at home and does not wish to make changes today  Follow-up with Drs Johnsie Cancel and Aundra Dubin as scheduled [facility], ICM clinic Return to see EP NP in 12 months  Current medicines are reviewed at length with the patient today.   The patient does not have concerns regarding his medicines.  The following changes were made today:  none   Signed, Thompson Grayer, MD  11/14/2016 4:27 PM     [address] [place] [address] [phone] (office) [phone] (fax)

## 2016-11-14 NOTE — Patient Instructions (Addendum)
Medication Instructions:   Your physician has recommended you make the following change in your medication:  1) Decrease Amiodarone to 100 mg daily   Labwork: None ordered   Testing/Procedures: None ordered   Follow-Up: Remote monitoring is used to monitor your  ICD from home. This monitoring reduces the number of office visits required to check your device to one time per year. It allows [place] to keep an eye on the functioning of your device to ensure it is working properly. You are scheduled for a device check from home on 02/13/17. You may send your transmission at any time that day. If you have a wireless device, the transmission will be sent automatically. After your physician reviews your transmission, you will receive a postcard with your next transmission date.  Your physician wants you to follow-up in: 12 months with Chanetta Marshall, NP You will receive a reminder letter in the mail two months in advance. If you don't receive a letter, please call our office to schedule the follow-up appointment.    Low-Sodium Eating Plan Sodium raises blood pressure and causes water to be held in the body. Getting less sodium from food will help lower your blood pressure, reduce any swelling, and protect your heart, liver, and kidneys. We get sodium by adding salt (sodium chloride) to food. Most of our sodium comes from canned, boxed, and frozen foods. Restaurant foods, fast foods, and pizza are also very high in sodium. Even if you take medicine to lower your blood pressure or to reduce fluid in your body, getting less sodium from your food is important. What is my plan? Most people should limit their sodium intake to 2,300 mg a day. Your health care provider recommends that you limit your sodium intake to 2,000mg  a day. What do I need to know about this eating plan? For the low-sodium eating plan, you will follow these general guidelines:  Choose foods with a % Daily Value for sodium of less than 5%  (as listed on the food label).  Use salt-free seasonings or herbs instead of table salt or sea salt.  Check with your health care provider or pharmacist before using salt substitutes.  Eat fresh foods.  Eat more vegetables and fruits.  Limit canned vegetables. If you do use them, rinse them well to decrease the sodium.  Limit cheese to 1 oz (28 g) per day.  Eat lower-sodium products, often labeled as "lower sodium" or "no salt added."  Avoid foods that contain monosodium glutamate (MSG). MSG is sometimes added to [place] food and some canned foods.  Check food labels (Nutrition Facts labels) on foods to learn how much sodium is in one serving.  Eat more home-cooked food and less restaurant, buffet, and fast food.  When eating at a restaurant, ask that your food be prepared with less salt, or no salt if possible. How do I read food labels for sodium information? The Nutrition Facts label lists the amount of sodium in one serving of the food. If you eat more than one serving, you must multiply the listed amount of sodium by the number of servings. Food labels may also identify foods as:  Sodium free-Less than 5 mg in a serving.  Very low sodium-35 mg or less in a serving.  Low sodium-140 mg or less in a serving.  Light in sodium-50% less sodium in a serving. For example, if a food that usually has 300 mg of sodium is changed to become light in sodium, it will  have 150 mg of sodium.  Reduced sodium-25% less sodium in a serving. For example, if a food that usually has 400 mg of sodium is changed to reduced sodium, it will have 300 mg of sodium. What foods can I eat? Grains  Low-sodium cereals, including oats, puffed wheat and rice, and shredded wheat cereals. Low-sodium crackers. Unsalted rice and pasta. Lower-sodium bread. Vegetables  Frozen or fresh vegetables. Low-sodium or reduced-sodium canned vegetables. Low-sodium or reduced-sodium tomato sauce and paste. Low-sodium or  reduced-sodium tomato and vegetable juices. Fruits  Fresh, frozen, and canned fruit. Fruit juice. Meat and Other Protein Products  Low-sodium canned tuna and salmon. Fresh or frozen meat, poultry, seafood, and fish. Lamb. Unsalted nuts. Dried beans, peas, and lentils without added salt. Unsalted canned beans. Homemade soups without salt. Eggs. Dairy  Milk. Soy milk. Ricotta cheese. Low-sodium or reduced-sodium cheeses. Yogurt. Condiments  Fresh and dried herbs and spices. Salt-free seasonings. Onion and garlic powders. Low-sodium varieties of mustard and ketchup. Fresh or refrigerated horseradish. Lemon juice. Fats and Oils  Reduced-sodium salad dressings. Unsalted butter. Other  Unsalted popcorn and pretzels. The items listed above may not be a complete list of recommended foods or beverages. Contact your dietitian for more options.  What foods are not recommended? Grains  Instant hot cereals. Bread stuffing, pancake, and biscuit mixes. Croutons. Seasoned rice or pasta mixes. Noodle soup cups. Boxed or frozen macaroni and cheese. Self-rising flour. Regular salted crackers. Vegetables  Regular canned vegetables. Regular canned tomato sauce and paste. Regular tomato and vegetable juices. Frozen vegetables in sauces. Salted [place] fries. Olives. Angie Fava. Relishes. Sauerkraut. Salsa. Meat and Other Protein Products  Salted, canned, smoked, spiced, or pickled meats, seafood, or fish. Bacon, ham, sausage, hot dogs, corned beef, chipped beef, and packaged luncheon meats. Salt pork. Jerky. Pickled herring. Anchovies, regular canned tuna, and sardines. Salted nuts. Dairy  Processed cheese and cheese spreads. Cheese curds. Blue cheese and cottage cheese. Buttermilk. Condiments  Onion and garlic salt, seasoned salt, table salt, and sea salt. Canned and packaged gravies. Worcestershire sauce. Tartar sauce. Barbecue sauce. Teriyaki sauce. Soy sauce, including reduced sodium. Steak sauce. Fish sauce.  Oyster sauce. Cocktail sauce. Horseradish that you find on the shelf. Regular ketchup and mustard. Meat flavorings and tenderizers. Bouillon cubes. Hot sauce. Tabasco sauce. Marinades. Taco seasonings. Relishes. Fats and Oils  Regular salad dressings. Salted butter. Margarine. Ghee. Bacon fat. Other  Potato and tortilla chips. Corn chips and puffs. Salted popcorn and pretzels. Canned or dried soups. Pizza. Frozen entrees and pot pies. The items listed above may not be a complete list of foods and beverages to avoid. Contact your dietitian for more information.  This information is not intended to replace advice given to you by your health care provider. Make sure you discuss any questions you have with your health care provider. Document Released: 03/09/2002 Document Revised: 02/23/2016 Document Reviewed: 07/22/2013 Elsevier Interactive Patient Education  2017 [employer].      Any Other Special Instructions Will Be Listed Below (If Applicable).     If you need a refill on your cardiac medications before your next appointment, please call your pharmacy.

## 2016-11-20 ENCOUNTER — Other Ambulatory Visit (HOSPITAL_COMMUNITY): Payer: Medicare Other

## 2016-11-21 ENCOUNTER — Encounter (HOSPITAL_COMMUNITY): Payer: Medicare Other | Attending: Oncology

## 2016-11-21 ENCOUNTER — Other Ambulatory Visit (HOSPITAL_COMMUNITY): Payer: Medicare Other

## 2016-11-21 DIAGNOSIS — D696 Thrombocytopenia, unspecified: Secondary | ICD-10-CM | POA: Diagnosis present

## 2016-11-21 DIAGNOSIS — D649 Anemia, unspecified: Secondary | ICD-10-CM | POA: Diagnosis not present

## 2016-11-21 LAB — COMPREHENSIVE METABOLIC PANEL
ALK PHOS: 99 U/L (ref 38–126)
ALT: 18 U/L (ref 17–63)
ANION GAP: 7 (ref 5–15)
AST: 19 U/L (ref 15–41)
Albumin: 3.6 g/dL (ref 3.5–5.0)
BILIRUBIN TOTAL: 0.4 mg/dL (ref 0.3–1.2)
BUN: 15 mg/dL (ref 6–20)
CALCIUM: 8.3 mg/dL — AB (ref 8.9–10.3)
CO2: 28 mmol/L (ref 22–32)
Chloride: 103 mmol/L (ref 101–111)
Creatinine, Ser: 1.4 mg/dL — ABNORMAL HIGH (ref 0.61–1.24)
GFR calc Af Amer: 55 mL/min — ABNORMAL LOW (ref >60)
GFR, EST NON AFRICAN AMERICAN: 48 mL/min — AB (ref >60)
GLUCOSE: 178 mg/dL — AB (ref 65–99)
Potassium: 4 mmol/L (ref 3.5–5.1)
Sodium: 138 mmol/L (ref 135–145)
TOTAL PROTEIN: 6.7 g/dL (ref 6.5–8.1)

## 2016-11-21 LAB — CBC WITH DIFFERENTIAL/PLATELET
BASOS PCT: 1 %
Basophils Absolute: 0 10*3/uL (ref 0.0–0.1)
Eosinophils Absolute: 0.1 10*3/uL (ref 0.0–0.7)
Eosinophils Relative: 2 %
HEMATOCRIT: 35.8 % — AB (ref 39.0–52.0)
HEMOGLOBIN: 11.5 g/dL — AB (ref 13.0–17.0)
LYMPHS ABS: 1 10*3/uL (ref 0.7–4.0)
LYMPHS PCT: 23 %
MCH: 30.8 pg (ref 26.0–34.0)
MCHC: 32.1 g/dL (ref 30.0–36.0)
MCV: 96 fL (ref 78.0–100.0)
MONO ABS: 0.3 10*3/uL (ref 0.1–1.0)
MONOS PCT: 8 %
NEUTROS ABS: 2.8 10*3/uL (ref 1.7–7.7)
NEUTROS PCT: 66 %
Platelets: 127 10*3/uL — ABNORMAL LOW (ref 150–400)
RBC: 3.73 MIL/uL — ABNORMAL LOW (ref 4.22–5.81)
RDW: 14.8 % (ref 11.5–15.5)
WBC: 4.2 10*3/uL (ref 4.0–10.5)

## 2016-11-21 LAB — IRON AND TIBC
IRON: 58 ug/dL (ref 45–182)
Saturation Ratios: 21 % (ref 17.9–39.5)
TIBC: 274 ug/dL (ref 250–450)
UIBC: 216 ug/dL

## 2016-11-21 LAB — FERRITIN: Ferritin: 91 ng/mL (ref 24–336)

## 2016-11-22 ENCOUNTER — Other Ambulatory Visit (HOSPITAL_COMMUNITY): Payer: Self-pay | Admitting: Oncology

## 2016-11-23 LAB — CUP PACEART INCLINIC DEVICE CHECK
Brady Statistic AP VP Percent: 97.53 %
Brady Statistic AP VS Percent: 1.59 %
Brady Statistic AS VS Percent: 0.06 %
Brady Statistic RV Percent Paced: 7.73 %
Date Time Interrogation Session: 20180214215852
HighPow Impedance: 71 Ohm
Implantable Lead Implant Date: 20160607
Implantable Lead Location: 753858
Implantable Lead Model: 4598
Implantable Pulse Generator Implant Date: 20160607
Lead Channel Impedance Value: 399 Ohm
Lead Channel Impedance Value: 399 Ohm
Lead Channel Impedance Value: 532 Ohm
Lead Channel Impedance Value: 570 Ohm
Lead Channel Impedance Value: 589 Ohm
Lead Channel Impedance Value: 950 Ohm
Lead Channel Impedance Value: 969 Ohm
Lead Channel Pacing Threshold Amplitude: 0.75 V
Lead Channel Pacing Threshold Amplitude: 0.75 V
Lead Channel Pacing Threshold Pulse Width: 0.4 ms
Lead Channel Pacing Threshold Pulse Width: 0.4 ms
Lead Channel Sensing Intrinsic Amplitude: 12.5 mV
Lead Channel Setting Pacing Amplitude: 2.25 V
Lead Channel Setting Pacing Pulse Width: 0.4 ms
Lead Channel Setting Sensing Sensitivity: 0.3 mV
MDC IDC LEAD IMPLANT DT: 20160607
MDC IDC LEAD IMPLANT DT: 20160607
MDC IDC LEAD LOCATION: 753859
MDC IDC LEAD LOCATION: 753860
MDC IDC MSMT BATTERY REMAINING LONGEVITY: 91 mo
MDC IDC MSMT BATTERY VOLTAGE: 2.99 V
MDC IDC MSMT LEADCHNL LV IMPEDANCE VALUE: 779 Ohm
MDC IDC MSMT LEADCHNL LV IMPEDANCE VALUE: 817 Ohm
MDC IDC MSMT LEADCHNL LV IMPEDANCE VALUE: 855 Ohm
MDC IDC MSMT LEADCHNL LV IMPEDANCE VALUE: 988 Ohm
MDC IDC MSMT LEADCHNL LV PACING THRESHOLD PULSEWIDTH: 0.4 ms
MDC IDC MSMT LEADCHNL RA PACING THRESHOLD AMPLITUDE: 0.75 V
MDC IDC MSMT LEADCHNL RA SENSING INTR AMPL: 1.5 mV
MDC IDC MSMT LEADCHNL RV IMPEDANCE VALUE: 437 Ohm
MDC IDC MSMT LEADCHNL RV IMPEDANCE VALUE: 513 Ohm
MDC IDC SET LEADCHNL RA PACING AMPLITUDE: 1.5 V
MDC IDC SET LEADCHNL RV PACING AMPLITUDE: 2 V
MDC IDC SET LEADCHNL RV PACING PULSEWIDTH: 0.4 ms
MDC IDC STAT BRADY AS VP PERCENT: 0.82 %
MDC IDC STAT BRADY RA PERCENT PACED: 99.05 %

## 2016-11-24 ENCOUNTER — Other Ambulatory Visit (HOSPITAL_COMMUNITY): Payer: Self-pay | Admitting: Cardiology

## 2016-11-28 ENCOUNTER — Other Ambulatory Visit: Payer: Self-pay | Admitting: Cardiovascular Disease

## 2016-11-29 ENCOUNTER — Encounter (HOSPITAL_COMMUNITY): Payer: Medicare Other | Attending: Hematology & Oncology

## 2016-11-29 ENCOUNTER — Encounter (HOSPITAL_COMMUNITY): Payer: Self-pay

## 2016-11-29 VITALS — BP 150/67 | HR 60 | Temp 98.2°F | Resp 18

## 2016-11-29 DIAGNOSIS — D500 Iron deficiency anemia secondary to blood loss (chronic): Secondary | ICD-10-CM

## 2016-11-29 DIAGNOSIS — D649 Anemia, unspecified: Secondary | ICD-10-CM | POA: Insufficient documentation

## 2016-11-29 DIAGNOSIS — D509 Iron deficiency anemia, unspecified: Secondary | ICD-10-CM | POA: Diagnosis not present

## 2016-11-29 DIAGNOSIS — R195 Other fecal abnormalities: Secondary | ICD-10-CM

## 2016-11-29 MED ORDER — SODIUM CHLORIDE 0.9 % IV SOLN
510.0000 mg | Freq: Once | INTRAVENOUS | Status: AC
  Administered 2016-11-29: 510 mg via INTRAVENOUS
  Filled 2016-11-29: qty 17

## 2016-11-29 MED ORDER — SODIUM CHLORIDE 0.9 % IV SOLN
Freq: Once | INTRAVENOUS | Status: AC
  Administered 2016-11-29: 14:00:00 via INTRAVENOUS

## 2016-11-29 NOTE — Progress Notes (Signed)
Tolerated infusion w/o adverse reaction.  Alert, in no distress.  VSS.  Discharged ambulatory.  

## 2016-11-29 NOTE — Patient Instructions (Signed)
[facility] at [facility] Discharge Instructions  RECOMMENDATIONS MADE BY THE CONSULTANT AND ANY TEST RESULTS WILL BE SENT TO YOUR REFERRING PHYSICIAN.  Iron infusion today. Return as scheduled.   Thank you for choosing [facility] at [facility] to provide your oncology and hematology care.  To afford each patient quality time with our provider, please arrive at least 15 minutes before your scheduled appointment time.    If you have a lab appointment with the Cancer Center please come in thru the  Main Entrance and check in at the main information desk  You need to re-schedule your appointment should you arrive 10 or more minutes late.  We strive to give you quality time with our providers, and arriving late affects you and other patients whose appointments are after yours.  Also, if you no show three or more times for appointments you may be dismissed from the clinic at the providers discretion.     Again, thank you for choosing [facility].  Our hope is that these requests will decrease the amount of time that you wait before being seen by our physicians.       _____________________________________________________________  Should you have questions after your visit to [facility], please contact our office at [phone] between the hours of 8:30 a.m. and 4:30 p.m.  Voicemails left after 4:30 p.m. will not be returned until the following business day.  For prescription refill requests, have your pharmacy contact our office.       Resources For Cancer Patients and their Caregivers ? American Cancer Society: Can assist with transportation, wigs, general needs, runs Look Good Feel Better.        1-888-227-6333 ? Cancer Care: Provides financial assistance, online support groups, medication/co-pay assistance.  1-800-813-HOPE (4673) ? [facility] Assists [place] cancer patients and their  families through emotional , educational and financial support.  336-427-4357 ? [place] DSS Where to apply for food stamps, Medicaid and utility assistance. 336-342-1394 ? [facility]: Transportation to medical appointments. 336-347-2287 ? Social Security Administration: May apply for disability if have a Stage IV cancer. 336-342-7796 1-800-772-1213 ? [place] Aging, Disability and Transit Services: Assists with nutrition, care and transit needs. 336-349-2343  Cancer Center Support Programs: @10RELATIVEDAYS@ > Cancer Support Group  2nd Tuesday of the month 1pm-2pm, Journey Room  > Creative Journey  3rd Tuesday of the month 1130am-1pm, Journey Room  > Look Good Feel Better  1st Wednesday of the month 10am-12 noon, Journey Room (Call American Cancer Society to register 1-800-395-5775)   

## 2016-12-18 ENCOUNTER — Telehealth: Payer: Self-pay | Admitting: Cardiology

## 2016-12-18 NOTE — Progress Notes (Signed)
No ICM remote transmission received for 12/18/2016 and next ICM transmission scheduled for 01/03/2017.

## 2016-12-18 NOTE — Telephone Encounter (Signed)
LMOVM reminding pt to send remote transmission.   

## 2016-12-19 ENCOUNTER — Telehealth: Payer: Self-pay | Admitting: Cardiology

## 2016-12-19 ENCOUNTER — Telehealth: Payer: Self-pay | Admitting: *Deleted

## 2016-12-19 NOTE — Telephone Encounter (Signed)
Spoke w/ pt and informed him that we did receive his remote transmission. Informed him that ICM RN is out of the office for the rest of the week, not to expect a call until next week. Pt verbalized understanding.

## 2016-12-19 NOTE — Telephone Encounter (Signed)
Follow Up:   Pt wants to know if you received his transmission this morning?

## 2016-12-19 NOTE — Telephone Encounter (Signed)
Patient called stating that this morning his nose was bleeding thin bright red blood for about 5 minutes, and his mouth was very dry. He said his humidity is set on 22. It has happened before but it was clots that came out when he blew his nose. Please advise

## 2016-12-24 ENCOUNTER — Telehealth: Payer: Self-pay | Admitting: Cardiovascular Disease

## 2016-12-24 NOTE — Telephone Encounter (Signed)
Dr Johnsie Cancel aware of pt's symptoms, and  recommends for pt to hold Xarelto 20 mg for 3 day. Pt needs to call his PCP so he can  Referrer him to a ENT doctor. Pt need for the Doctor that order his C-Pap to see if can regulate the C-Pap. Pt is to start the Xarelto after 3 days, but if he start bleeding again after restarting the Xarelto he is  to hold the Xarelto until he see the ENT. Pt is aware of MD' recommendations. Pt states that he hal already called the doctor  in charge of his    C-pap and they had not call back.

## 2016-12-24 NOTE — Telephone Encounter (Signed)
Pt states that for a week he has been having nose bleeding almost every night. He has a C-Pap his mouth and nose gets dry.  It  Happened  during the night. Pt  wakes up with blood in his mouth. Pt did not have any bleeding last night , but he did this morning when he got up. The bleeding  lasted about 5 minutes to stop. Pt said that it is a substantial amount of bleeding. Pt is taking Xarelto 20 mg daily he takes this medication in the evenings.

## 2016-12-24 NOTE — Telephone Encounter (Signed)
New Message:   Pt says he having nose bleeds pretty regular. Pt is very concerned,because he is on Xarelto.

## 2017-01-03 ENCOUNTER — Other Ambulatory Visit (HOSPITAL_COMMUNITY): Payer: Self-pay | Admitting: Oncology

## 2017-01-03 ENCOUNTER — Telehealth: Payer: Self-pay | Admitting: Cardiology

## 2017-01-03 ENCOUNTER — Ambulatory Visit (INDEPENDENT_AMBULATORY_CARE_PROVIDER_SITE_OTHER): Payer: Medicare Other

## 2017-01-03 ENCOUNTER — Encounter: Payer: Self-pay | Admitting: Cardiology

## 2017-01-03 DIAGNOSIS — I5022 Chronic systolic (congestive) heart failure: Secondary | ICD-10-CM | POA: Diagnosis not present

## 2017-01-03 DIAGNOSIS — Z95810 Presence of automatic (implantable) cardiac defibrillator: Secondary | ICD-10-CM | POA: Diagnosis not present

## 2017-01-03 NOTE — Progress Notes (Signed)
EPIC Encounter for ICM Monitoring  Patient Name: Chad Brown is a 76 y.o. male Date: 01/03/2017 Primary Care Physican: Sherrie Mustache, MD Primary Cardiologist:Nishan/McLean Electrophysiologist: Allred Dry Weight:224.2 lbs Bi-V Pacing: 98.2%      Heart Failure questions reviewed, pt asymptomatic.   Thoracic impedance abnormal suggesting fluid accumulation since 12/23/2016 but very close to baseline.  He stated fluid may be due to holiday foods.    Prescribed dosage: Furosemide 20 mg 2 tablets daily.  Potassium 20 mEq 1 tablet twice a day  Labs: 11/21/2016 Creatinine 1.40, BUN 15, Potassium 4.0, Sodium 138, EGFR 48-55 07/25/2016 Creatinine 1.29, BUN 31, Potassium 4.2, Sodium 136, EGFR 53->60 05/21/2016 Creatinine 1.24, BUN 16, Potassium 4.2, Sodium 138, EGFR 55->60  03/19/2016 Creatinine 1.09, BUN 15, Potassium 4.6, Sodium 137, EGFR >60  01/19/2016 Creatinine 1.15, BUN 10, Potassium 4.2, Sodium 143, EGFR >60  11/04/2015 Creatinine 1.23, BUN 14, Potassium 4.0, Sodium 142, EGFR 56->60   Recommendations: No changes. Discussed reading food labels to determine amount of daily salt intake.  Reminded to limit dietary salt intake to 2000 mg/day and fluid intake to < 2 liters/day. Encouraged to call for fluid symptoms.  Follow-up plan: ICM clinic phone appointment on 02/13/2017.    Copy of ICM check sent to primary cardiologist and device physician for review and if any recommendation will call back.    3 month ICM trend: 01/03/2017   1 Year ICM trend:      Rosalene Billings, RN 01/03/2017 2:40 PM

## 2017-01-03 NOTE — Telephone Encounter (Signed)
Spoke with pt and reminded pt of remote transmission that is due today. Pt verbalized understanding.   

## 2017-01-17 ENCOUNTER — Other Ambulatory Visit: Payer: Self-pay | Admitting: *Deleted

## 2017-01-17 DIAGNOSIS — C439 Malignant melanoma of skin, unspecified: Secondary | ICD-10-CM

## 2017-01-17 DIAGNOSIS — I4819 Other persistent atrial fibrillation: Secondary | ICD-10-CM

## 2017-01-17 MED ORDER — AMIODARONE HCL 200 MG PO TABS: 100.0000 mg | ORAL_TABLET | Freq: Every day | ORAL | 9 refills | Status: DC

## 2017-01-18 NOTE — Telephone Encounter (Signed)
Called the patient to follow up on the problem he was having with his nose bleeding on his cpap machine. I spoke to his wife(Nancy) Per DPR who informed me that his nose bled lightly for a couple of days then he switched to another facial mask and he did not have any more problems. I offered an apology to the patient for not responding back to him in a timely manner. His wife accepted my apology and said she would let the patient know. They plan on keeping his appointment on Monday 01/21/17.

## 2017-01-20 NOTE — Progress Notes (Signed)
Cardiology Office Note    Date:  01/21/2017   ID:  Chad Brown, DOB 1940-11-16, MRN [id number]  PCP:  Sherrie Mustache, MD  Cardiologist:  Fransico Him, MD   Chief Complaint  Patient presents with  . Sleep Apnea  . Hypertension    History of Present Illness:  Chad Brown is a 76 y.o. male with a history of HTN, dyslipidemia, CHF and PAF who is here today for followup of his OSA.  He has mild OSA with an AHI of 12/hr and oxygen desaturations as low as 73% and is on CPAP at 14cm H2O.  He is doing well with his CPAP therapy.  He uses a nasal pillow mask which he tolerates well and feels the pressure is adequate. He occasionally has some dry mouth in the am and uses Biotene mouth wash and humidity.  He continues to feel rested in the am and has minimal daytime sleepiness and occasionally will take an afternoon nap.  He has minimal snoring at night.   Past Medical History:  Diagnosis Date  . Adenomatous colon polyp    tubular  . AICD (automatic cardioverter/defibrillator) present 03/08/2015   MDT CRTD   . Anemia    iron deficient  . Arthritis    "about all my joints; hands, knees, back" (03/08/2015)  . Atherosclerosis   . Cataract    left eye small  . Cholelithiasis    gallstones  . Chronic systolic CHF (congestive heart failure) ([place])    a. New dx 12/2012 ? NICM, may be r/t afib. b. Nuc 03/2013 - normal;  c. 03/2015 TEE EF 15-20%.  . Colon polyp, hyperplastic 5/16   removed precancerous lesions  . Depression   . Diverticulosis   . Glaucoma    right eye  . Hyperlipidemia   . Hypertension   . Melanoma of eye ([place]) 2000's   "right; it's never been biopsied"  . Melanoma of lower leg ([place]) 2015   "left; right at my knee"  . Myocardial infarction ([place]) 1998  . OSA (obstructive sleep apnea) 01/04/2016   uses cpap, pt does not know settings auto set  . Peripheral vision loss, right 2006  . Persistent atrial fibrillation ([place])    a. Dx 12/2012, s/p TEE/DCCV 01/26/13. b.  On Xarelto (CHA2DS2VASc = 3);  c. 03/2015 TEE (EF 15-20%, no LAA thrombus) and DCCV - amio increased to 200 mg bid.  . Urinary hesitancy due to benign prostatic hypertrophy     Past Surgical History:  Procedure Laterality Date  . BACK SURGERY     upper back, cannot turn neck well  . CARDIAC CATHETERIZATION  1998  . CARDIOVERSION N/A 01/26/2013   Procedure: CARDIOVERSION;  Surgeon: Lelon Perla, MD;  Location: [facility] ENDOSCOPY;  Service: Cardiovascular;  Laterality: N/A;  . CARDIOVERSION N/A 03/23/2015   Procedure: CARDIOVERSION;  Surgeon: Jerline Pain, MD;  Location: [place];  Service: Cardiovascular;  Laterality: N/A;  . CATARACT EXTRACTION Right ~ 2006  . COLONOSCOPY WITH PROPOFOL N/A 02/10/2015   Procedure: COLONOSCOPY WITH PROPOFOL;  Surgeon: Gatha Mayer, MD;  Location: [facility] ENDOSCOPY;  Service: Endoscopy;  Laterality: N/A;  . COLONOSCOPY WITH PROPOFOL N/A 08/07/2016   Procedure: COLONOSCOPY WITH PROPOFOL;  Surgeon: Gatha Mayer, MD;  [facility] ENDOSCOPY;  Service: Endoscopy;  Laterality: N/A;  . ENTEROSCOPY N/A 08/17/2015   Procedure: ENTEROSCOPY;  Surgeon: Gatha Mayer, MD;  [facility] ENDOSCOPY;  Service: Endoscopy;  Laterality: N/A;  . EP IMPLANTABLE DEVICE  N/A 03/08/2015   MDT Hillery Aldo CRT-D for nonischemic CM by Dr Rayann Heman for primary prevention  . GLAUCOMA SURGERY Right ~ 2006   "put 3 stents in to drain fluid" (03/08/2015) not successful, [facility] get [facility] stent out  . HOT HEMOSTASIS N/A 08/07/2016   Procedure: HOT HEMOSTASIS (ARGON PLASMA COAGULATION/BICAP);  Surgeon: Gatha Mayer, MD;  Location: [facility]OS[facility];  Service: Endoscopy;  Laterality: N/A;  . INCISION AND DRAINAGE ABSCESS POSTERIOR CERVICALSPINE  05/2012  . JOINT REPLACEMENT    . MELANOMA EXCISION Left 2015   "lower leg; right at my knee"  . REFRACTIVE SURGERY Right ~ 2006 X 2   "twice; both done at [place]" (03/08/2015  . SURGERY SCROTAL / TESTICULAR Right 1990's  . TEE WITHOUT CARDIOVERSION N/A  01/26/2013   Procedure: TRANSESOPHAGEAL ECHOCARDIOGRAM (TEE);  Surgeon: Lelon Perla, MD;  Location: [facility];  Service: Cardiovascular;  Laterality: N/A;  [name] anes. /   . TEE WITHOUT CARDIOVERSION N/A 10/05/2014   Procedure: TRANSESOPHAGEAL ECHOCARDIOGRAM (TEE)  with cardioversion;  Surgeon: Thayer Headings, MD;  Location: [facility] ENDOSCOPY;  Service: Cardiovascular;  Laterality: N/A;  12:52 synched cardioversion at 120 joules,...afib to SR...12 lead EKG ordered.Marland KitchenMarland KitchenCardiozem d/c'ed per MD verbal order at SR  . TEE WITHOUT CARDIOVERSION N/A 03/23/2015   Procedure: TRANSESOPHAGEAL ECHOCARDIOGRAM (TEE);  Surgeon: Jerline Pain, MD;  Location: [facility] ENDOSCOPY;  Service: Cardiovascular;  Laterality: N/A;  . THORACIC SPINE SURGERY  03/2000   "ground calcium deposits from upper thoracic" (01/26/2013)  . TOTAL HIP ARTHROPLASTY Right 06/2007    Current Medications: Current Meds  Medication Sig  . acetaminophen (TYLENOL) 500 MG tablet Take 1,000 mg by mouth every 4 (four) hours as needed for moderate pain or headache.  [name] amiodarone (PACERONE) 200 MG tablet Take 0.5 tablets (100 mg total) by mouth daily.  . carvedilol (COREG) 12.5 MG tablet Take 1 tablet (12.5 mg total) by mouth 2 (two) times daily with a meal.  . dorzolamide-timolol (COSOPT) 22.3-6.8 MG/ML ophthalmic solution Place 1 drop into the right eye 2 (two) times daily.  [name] escitalopram (LEXAPRO) 20 MG tablet TAKE 1 TABLET ONCE A DAY  . ezetimibe (ZETIA) 10 MG tablet Take 1 tablet (10 mg total) by mouth daily.  . finasteride (PROSCAR) 5 MG tablet Take 5 mg by mouth every morning.   . furosemide (LASIX) 20 MG tablet Take 2 tablets (40 mg total) by mouth daily.  [name] gabapentin (NEURONTIN) 100 MG capsule TAKE 1 TAB BY MOUTH IN THE AM AND 2 TABS BY MOUTH IN THE PM  . losartan (COZAAR) 50 MG tablet Take 25-50 mg by mouth as directed.  . niacin (NIASPAN) 500 MG CR tablet Take 500 mg by mouth 2 (two) times daily.  [name] Glycol-Propyl Glycol (SYSTANE  ULTRA OP) Place 1 drop into the left eye 2 (two) times daily.   . potassium chloride SA (K-DUR,KLOR-CON) 20 MEQ tablet Take 1 tablet (20 mEq total) by mouth 2 (two) times daily.  . rivaroxaban (XARELTO) 20 MG TABS tablet Take 1 tablet (20 mg total) by mouth daily with supper. RESTART 08/11/16  . spironolactone (ALDACTONE) 25 MG tablet TAKE (1/2) TABLET DAILY.  . tamsulosin (FLOMAX) 0.4 MG CAPS Take 0.4 mg by mouth at bedtime.     Allergies:   Pravastatin sodium and Zithromax [azithromycin]   Social History   Social History  . Marital status: Married    Spouse name: N/A  . Number of children: 3  .  Years of education: N/A   Occupational History  . retired    Social History Main Topics  . Smoking status: Former Smoker    Packs/day: 3.00    Years: 48.00    Types: Cigarettes    Quit date: 09/28/2000  . Smokeless tobacco: Never Used  . Alcohol use No  . Drug use: No  . Sexual activity: Yes   Other Topics Concern  . None   Social History Narrative   Pt lives in [place] with spouse.  3 children are grown and healthy.   Retired.  Ran a country store for 30 years, previously worked in the [employer] for 16 years     Family History:  The patient's family history includes Colon cancer in his paternal uncle; Diabetes in his maternal aunt, maternal grandmother, maternal uncle, and mother; Heart attack in his maternal uncle; Heart disease in his mother; Kidney disease in his mother; Leukemia in his father; Lung cancer in his paternal uncle; Prostate cancer in his paternal uncle.   ROS:   Please see the history of present illness.    ROS All other systems reviewed and are negative.  No flowsheet data found.     PHYSICAL EXAM:   VS:  BP 122/74   Pulse 89   Resp (!) 96   Ht 5' 6.5" (1.689 m)   Wt 229 lb (103.9 kg)   BMI 36.41 kg/m    GEN: Well nourished, well developed, in no acute distress  HEENT: normal  Neck: no JVD, carotid bruits, or masses Cardiac: RRR;  no murmurs, rubs, or gallops,no edema.  Intact distal pulses bilaterally.  Respiratory:  clear to auscultation bilaterally, normal work of breathing GI: soft, nontender, nondistended, + BS MS: no deformity or atrophy  Skin: warm and dry, no rash Neuro:  Alert and Oriented x 3, Strength and sensation are intact Psych: euthymic mood, full affect  Wt Readings from Last 3 Encounters:  01/21/17 229 lb (103.9 kg)  11/14/16 226 lb 12.8 oz (102.9 kg)  10/08/16 225 lb 6.4 oz (102.2 kg)      Studies/Labs Reviewed:   EKG:  EKG is not ordered today.    Recent Labs: 08/14/2016: TSH 1.637 11/21/2016: ALT 18; BUN 15; Creatinine, Ser 1.40; Hemoglobin 11.5; Platelets 127; Potassium 4.0; Sodium 138   Lipid Panel    Component Value Date/Time   CHOL 200 03/22/2015 0536   TRIG 105 03/22/2015 0536   HDL 26 (L) 03/22/2015 0536   CHOLHDL 7.7 03/22/2015 0536   VLDL 21 03/22/2015 0536   LDLCALC 153 (H) 03/22/2015 0536    Additional studies/ records that were reviewed today include:  CPAP download    ASSESSMENT:    1. OSA (obstructive sleep apnea)   2. Essential hypertension   3. Class 2 severe obesity due to excess calories with serious comorbidity and body mass index (BMI) of 36.0 to 36.9 in adult [facility])      PLAN:  In order of problems listed above:  OSA - the patient is tolerating PAP therapy well without any problems. The PAP download was reviewed today and showed an AHI of 1.7/hr on 14 cm H2O with 80% compliance in using more than 4 hours nightly.  The patient has been using and benefiting from CPAP use and will continue to benefit from therapy. I encourage him to get a new cushion for his mask to see if that helps with his snoring and mask leak.  HTN - BP controlled today on  current meds. Obesity - I have encouraged him to get into a routine exercise program and cut back on carbs and portions.     Medication Adjustments/Labs and Tests Ordered: Current medicines are reviewed at  length with the patient today.  Concerns regarding medicines are outlined above.  Medication changes, Labs and Tests ordered today are listed in the Patient Instructions below.  There are no Patient Instructions on file for this visit.   Signed, Fransico Him, MD  01/21/2017 8:04 AM    [address], [place], [place]  [address] Phone: [phone]; Fax: [phone]

## 2017-01-21 ENCOUNTER — Other Ambulatory Visit (HOSPITAL_COMMUNITY): Payer: Self-pay | Admitting: Cardiology

## 2017-01-21 ENCOUNTER — Encounter: Payer: Self-pay | Admitting: Cardiology

## 2017-01-21 ENCOUNTER — Ambulatory Visit (INDEPENDENT_AMBULATORY_CARE_PROVIDER_SITE_OTHER): Payer: Medicare Other | Admitting: Cardiology

## 2017-01-21 VITALS — BP 122/74 | HR 89 | Resp 96 | Ht 66.5 in | Wt 229.0 lb

## 2017-01-21 DIAGNOSIS — Z6836 Body mass index (BMI) 36.0-36.9, adult: Secondary | ICD-10-CM | POA: Diagnosis not present

## 2017-01-21 DIAGNOSIS — G4733 Obstructive sleep apnea (adult) (pediatric): Secondary | ICD-10-CM

## 2017-01-21 DIAGNOSIS — I10 Essential (primary) hypertension: Secondary | ICD-10-CM | POA: Diagnosis not present

## 2017-01-21 NOTE — Patient Instructions (Signed)

## 2017-01-24 ENCOUNTER — Encounter (HOSPITAL_COMMUNITY): Payer: Medicare Other | Attending: Oncology | Admitting: Oncology

## 2017-01-24 ENCOUNTER — Ambulatory Visit (HOSPITAL_COMMUNITY): Payer: Medicare Other | Admitting: Hematology & Oncology

## 2017-01-24 ENCOUNTER — Other Ambulatory Visit (HOSPITAL_COMMUNITY): Payer: Medicare Other

## 2017-01-24 ENCOUNTER — Encounter (HOSPITAL_COMMUNITY): Payer: Self-pay

## 2017-01-24 ENCOUNTER — Encounter (HOSPITAL_COMMUNITY): Payer: Medicare Other

## 2017-01-24 VITALS — BP 152/86 | HR 70 | Temp 98.2°F | Resp 20 | Wt 227.8 lb

## 2017-01-24 DIAGNOSIS — D696 Thrombocytopenia, unspecified: Secondary | ICD-10-CM

## 2017-01-24 DIAGNOSIS — N189 Chronic kidney disease, unspecified: Secondary | ICD-10-CM

## 2017-01-24 DIAGNOSIS — C4372 Malignant melanoma of left lower limb, including hip: Secondary | ICD-10-CM | POA: Diagnosis not present

## 2017-01-24 DIAGNOSIS — D509 Iron deficiency anemia, unspecified: Secondary | ICD-10-CM | POA: Diagnosis not present

## 2017-01-24 DIAGNOSIS — D508 Other iron deficiency anemias: Secondary | ICD-10-CM

## 2017-01-24 DIAGNOSIS — D649 Anemia, unspecified: Secondary | ICD-10-CM | POA: Diagnosis present

## 2017-01-24 DIAGNOSIS — K862 Cyst of pancreas: Secondary | ICD-10-CM

## 2017-01-24 LAB — CBC WITH DIFFERENTIAL/PLATELET
BASOS ABS: 0 10*3/uL (ref 0.0–0.1)
Basophils Relative: 1 %
Eosinophils Absolute: 0.1 10*3/uL (ref 0.0–0.7)
Eosinophils Relative: 2 %
HEMATOCRIT: 35.3 % — AB (ref 39.0–52.0)
Hemoglobin: 11.9 g/dL — ABNORMAL LOW (ref 13.0–17.0)
LYMPHS ABS: 1.2 10*3/uL (ref 0.7–4.0)
LYMPHS PCT: 26 %
MCH: 31.6 pg (ref 26.0–34.0)
MCHC: 33.7 g/dL (ref 30.0–36.0)
MCV: 93.9 fL (ref 78.0–100.0)
MONOS PCT: 9 %
Monocytes Absolute: 0.4 10*3/uL (ref 0.1–1.0)
NEUTROS ABS: 2.9 10*3/uL (ref 1.7–7.7)
Neutrophils Relative %: 62 %
Platelets: 130 10*3/uL — ABNORMAL LOW (ref 150–400)
RBC: 3.76 MIL/uL — ABNORMAL LOW (ref 4.22–5.81)
RDW: 15.7 % — AB (ref 11.5–15.5)
WBC: 4.5 10*3/uL (ref 4.0–10.5)

## 2017-01-24 LAB — COMPREHENSIVE METABOLIC PANEL
ALT: 16 U/L — ABNORMAL LOW (ref 17–63)
AST: 16 U/L (ref 15–41)
Albumin: 3.7 g/dL (ref 3.5–5.0)
Alkaline Phosphatase: 98 U/L (ref 38–126)
Anion gap: 5 (ref 5–15)
BILIRUBIN TOTAL: 0.5 mg/dL (ref 0.3–1.2)
BUN: 13 mg/dL (ref 6–20)
CO2: 30 mmol/L (ref 22–32)
Calcium: 8.5 mg/dL — ABNORMAL LOW (ref 8.9–10.3)
Chloride: 105 mmol/L (ref 101–111)
Creatinine, Ser: 1.46 mg/dL — ABNORMAL HIGH (ref 0.61–1.24)
GFR calc Af Amer: 52 mL/min — ABNORMAL LOW (ref >60)
GFR, EST NON AFRICAN AMERICAN: 45 mL/min — AB (ref >60)
Glucose, Bld: 94 mg/dL (ref 65–99)
POTASSIUM: 4.1 mmol/L (ref 3.5–5.1)
Sodium: 140 mmol/L (ref 135–145)
TOTAL PROTEIN: 7 g/dL (ref 6.5–8.1)

## 2017-01-24 LAB — IRON AND TIBC
IRON: 70 ug/dL (ref 45–182)
Saturation Ratios: 27 % (ref 17.9–39.5)
TIBC: 259 ug/dL (ref 250–450)
UIBC: 189 ug/dL

## 2017-01-24 LAB — FERRITIN: Ferritin: 160 ng/mL (ref 24–336)

## 2017-01-24 NOTE — Progress Notes (Signed)
[facility] NOTE  Patient Care Team: Dione Housekeeper, MD as PCP - General (Family Medicine)  [address] / [place] [address]   CHIEF COMPLAINTS/PURPOSE OF CONSULTATION:  T2a N0 Melanoma of the L Thigh s/p WLE and sentinel node with Dr. Freddi Che Abnormal CT and MRI imaging of the abdomen Anemia Iron deficiency on oral iron and requiring intermittent IV iron replacement  HISTORY OF PRESENTING ILLNESS:  Chad Brown 76 y.o. male returns for follow up of melanoma and anemia.   He is doing well today. He no longer takes oral iron at home. He reports chronic bilateral knee pain. He sometimes feel like "I have a frog in my throat" at times. No dysphagia. It happens mostly at night. Denies palpitations, leg swelling, blood in stool, melena, chest pain, SOB, abdominal pain, or any other concerns.   MEDICAL HISTORY:  Past Medical History:  Diagnosis Date  . Adenomatous colon polyp    tubular  . AICD (automatic cardioverter/defibrillator) present 03/08/2015   MDT CRTD   . Anemia    iron deficient  . Arthritis    "about all my joints; hands, knees, back" (03/08/2015)  . Atherosclerosis   . Cataract    left eye small  . Cholelithiasis    gallstones  . Chronic systolic CHF (congestive heart failure) ([place])    a. New dx 12/2012 ? NICM, may be r/t afib. b. Nuc 03/2013 - normal;  c. 03/2015 TEE EF 15-20%.  . Colon polyp, hyperplastic 5/16   removed precancerous lesions  . Depression   . Diverticulosis   . Glaucoma    right eye  . Hyperlipidemia   . Hypertension   . Melanoma of eye ([place]) 2000's   "right; it's never been biopsied"  . Melanoma of lower leg ([place]) 2015   "left; right at my knee"  . Myocardial infarction ([place]) 1998  . OSA (obstructive sleep apnea) 01/04/2016   uses cpap, pt does not know settings auto set  . Peripheral vision loss, right 2006  . Persistent atrial fibrillation ([place])    a. Dx 12/2012, s/p TEE/DCCV 01/26/13. b. On  Xarelto (CHA2DS2VASc = 3);  c. 03/2015 TEE (EF 15-20%, no LAA thrombus) and DCCV - amio increased to 200 mg bid.  . Urinary hesitancy due to benign prostatic hypertrophy     SURGICAL HISTORY: Past Surgical History:  Procedure Laterality Date  . BACK SURGERY     upper back, cannot turn neck well  . CARDIAC CATHETERIZATION  1998  . CARDIOVERSION N/A 01/26/2013   Procedure: CARDIOVERSION;  Surgeon: Lelon Perla, MD;  Location[facility] ENDOSCOPY;  Service: Cardiovascular;  Laterality: N/A;  . CARDIOVERSION N/A 03/23/2015   Procedure: CARDIOVERSION;  Surgeon: Jerline Pain, MD;  Location: [facility];  Service: Cardiovascular;  Laterality: N/A;  . CATARACT EXTRACTION Right ~ 2006  . COLONOSCOPY WITH PROPOFOL N/A 02/10/2015   Procedure: COLONOSCOPY WITH PROPOFOL;  Surgeon: Gatha Mayer, MD;  [facility] ENDOSCOPY;  Service: Endoscopy;  Laterality: N/A;  . COLONOSCOPY WITH PROPOFOL N/A 08/07/2016   Procedure: COLONOSCOPY WITH PROPOFOL;  Surgeon: Gatha Mayer, MD;  [facility] ENDOSCOPY;  Service: Endoscopy;  Laterality: N/A;  . ENTEROSCOPY N/A 08/17/2015   Procedure: ENTEROSCOPY;  Surgeon: Gatha Mayer, MD;  [facility] ENDOSCOPY;  Service: Endoscopy;  Laterality: N/A;  . EP IMPLANTABLE DEVICE N/A 03/08/2015   MDT Hillery Aldo CRT-D for nonischemic CM by Dr Rayann Heman for primary prevention  . GLAUCOMA  SURGERY Right ~ 2006   "put 3 stents in to drain fluid" (03/08/2015) not successful, [facility] get [facility] out  . HOT HEMOSTASIS N/A 08/07/2016   Procedure: HOT HEMOSTASIS (ARGON PLASMA COAGULATION/BICAP);  Surgeon: Gatha Mayer, MD;  Location: [facility] ENDOSCOPY;  Service: Endoscopy;  Laterality: N/A;  . INCISION AND DRAINAGE ABSCESS POSTERIOR CERVICALSPINE  05/2012  . JOINT REPLACEMENT    . MELANOMA EXCISION Left 2015   "lower leg; right at my knee"  . REFRACTIVE SURGERY Right ~ 2006 X 2   "twice; both done at [facility]" (03/08/2015  . SURGERY SCROTAL / TESTICULAR Right 1990's  . TEE WITHOUT  CARDIOVERSION N/A 01/26/2013   Procedure: TRANSESOPHAGEAL ECHOCARDIOGRAM (TEE);  Surgeon: Lelon Perla, MD;  Location: [facility];  Service: Cardiovascular;  Laterality: N/A;  [name] anes. /   . TEE WITHOUT CARDIOVERSION N/A 10/05/2014   Procedure: TRANSESOPHAGEAL ECHOCARDIOGRAM (TEE)  with cardioversion;  Surgeon: Thayer Headings, MD;  Location: [facility] ENDOSCOPY;  Service: Cardiovascular;  Laterality: N/A;  12:52 synched cardioversion at 120 joules,...afib to SR...12 lead EKG ordered.Marland KitchenMarland KitchenCardiozem d/c'ed per MD verbal order at SR  . TEE WITHOUT CARDIOVERSION N/A 03/23/2015   Procedure: TRANSESOPHAGEAL ECHOCARDIOGRAM (TEE);  Surgeon: Jerline Pain, MD;  Location: [facility] ENDOSCOPY;  Service: Cardiovascular;  Laterality: N/A;  . THORACIC SPINE SURGERY  03/2000   "ground calcium deposits from upper thoracic" (01/26/2013)  . TOTAL HIP ARTHROPLASTY Right 06/2007    SOCIAL HISTORY: Social History   Social History  . Marital status: Married    Spouse name: N/A  . Number of children: 3  . Years of education: N/A   Occupational History  . retired    Social History Main Topics  . Smoking status: Former Smoker    Packs/day: 3.00    Years: 48.00    Types: Cigarettes    Quit date: 09/28/2000  . Smokeless tobacco: Never Used  . Alcohol use No  . Drug use: No  . Sexual activity: Yes   Other Topics Concern  . Not on file   Social History Narrative   Pt lives in [place] with spouse.  3 children are grown and healthy.   Retired.  Ran a country store for 30 years, previously worked in the [employer] for 16 years   Married for 52 years Born in [employer]. He was a Scientist, water quality, Therapist, occupational, Radio broadcast assistant. 3 sons and 3 grandchildren  Quit smoking 14 years ago Plays golf.  FAMILY HISTORY: Family History  Problem Relation Age of Onset  . Kidney disease Mother   . Heart disease Mother     MI, open heart  . Diabetes Mother     dialysis  .  Leukemia Father   . Colon cancer Paternal Uncle   . Lung cancer Paternal Uncle     x 2  . Prostate cancer Paternal Uncle   . Diabetes Maternal Grandmother   . Heart attack Maternal Uncle   . Diabetes Maternal Aunt     x 3  . Diabetes Maternal Uncle    indicated that his mother is deceased. He indicated that his father is deceased. He indicated that his maternal grandmother is deceased. He indicated that his maternal grandfather is deceased. He indicated that his paternal grandmother is deceased. He indicated that his paternal grandfather is deceased. He indicated that the status of his maternal aunt is unknown.     Father passed away at  57 of leukemia  Mother passed away at 24 of diabetes, kidney disease and CHF.  1 brother and 2 sisters. Sister had throat cancer. Another sister had skin cancer.  ALLERGIES:  is allergic to pravastatin sodium and zithromax [azithromycin].  MEDICATIONS:  Current Outpatient Prescriptions  Medication Sig Dispense Refill  . acetaminophen (TYLENOL) 500 MG tablet Take 1,000 mg by mouth every 4 (four) hours as needed for moderate pain or headache.    [name] amiodarone (PACERONE) 200 MG tablet Take 0.5 tablets (100 mg total) by mouth daily. 15 tablet 9  . carvedilol (COREG) 12.5 MG tablet Take 1 tablet (12.5 mg total) by mouth 2 (two) times daily with a meal. 180 tablet 3  . dorzolamide-timolol (COSOPT) 22.3-6.8 MG/ML ophthalmic solution Place 1 drop into the right eye 2 (two) times daily.    [name] escitalopram (LEXAPRO) 20 MG tablet TAKE 1 TABLET ONCE A DAY 90 tablet 0  . ezetimibe (ZETIA) 10 MG tablet Take 1 tablet (10 mg total) by mouth daily. 90 tablet 3  . finasteride (PROSCAR) 5 MG tablet Take 5 mg by mouth every morning.     . furosemide (LASIX) 20 MG tablet Take 2 tablets (40 mg total) by mouth daily. 180 tablet 3  . gabapentin (NEURONTIN) 100 MG capsule TAKE 1 TAB BY MOUTH IN THE AM AND 2 TABS BY MOUTH IN THE PM    . losartan (COZAAR) 50 MG tablet TAKE 1  TABLET IN THE MORNING AND 1/2 TABLET IN THE EVENING 135 tablet 2  . niacin (NIASPAN) 500 MG CR tablet Take 500 mg by mouth 2 (two) times daily.    [name] Glycol-Propyl Glycol (SYSTANE ULTRA OP) Place 1 drop into the left eye 2 (two) times daily.     . potassium chloride SA (K-DUR,KLOR-CON) 20 MEQ tablet Take 1 tablet (20 mEq total) by mouth 2 (two) times daily. 135 tablet 3  . rivaroxaban (XARELTO) 20 MG TABS tablet Take 1 tablet (20 mg total) by mouth daily with supper. RESTART 08/11/16 90 tablet 3  . spironolactone (ALDACTONE) 25 MG tablet TAKE (1/2) TABLET DAILY. 45 tablet 3  . tamsulosin (FLOMAX) 0.4 MG CAPS Take 0.4 mg by mouth at bedtime.      No current facility-administered medications for this visit.     Review of Systems  HENT: Negative.        No dysphagia   Eyes: Negative.   Respiratory: Negative for shortness of breath.   Cardiovascular: Negative.  Negative for chest pain, palpitations and leg swelling.  Gastrointestinal: Negative.  Negative for abdominal pain, blood in stool and melena.  Genitourinary: Negative.   Musculoskeletal: Positive for joint pain (b/l knees).  Skin: Negative.   Neurological: Positive for weakness.  Endo/Heme/Allergies: Negative.   Psychiatric/Behavioral: Negative.   All other systems reviewed and are negative. 14 point ROS was done and is otherwise as detailed above or in HPI  PHYSICAL EXAMINATION: ECOG PERFORMANCE STATUS: 0 - Asymptomatic  Vitals:   01/24/17 1310  BP: (!) 152/86  Pulse: 70  Resp: 20  Temp: 98.2 F (36.8 C)   Filed Weights   01/24/17 1310  Weight: 227 lb 12.8 oz (103.3 kg)   Physical Exam  Constitutional: He is oriented to person, place, and time and well-developed, well-nourished, and in no distress. No distress.  HENT:  Head: Normocephalic and atraumatic.  Mouth/Throat: Oropharynx is clear and moist. No oropharyngeal exudate.  Eyes: Conjunctivae and EOM are normal. Pupils are equal, round, and reactive to  light. No scleral icterus.  Neck: Normal range of motion. Neck supple. No JVD present.  Cardiovascular: Normal rate, regular rhythm and normal heart sounds.  Exam reveals no gallop and no friction rub.   No murmur heard. Pulmonary/Chest: Effort normal and breath sounds normal. No respiratory distress. He has no wheezes. He has no rales.  Abdominal: Soft. Bowel sounds are normal. He exhibits no distension. There is no tenderness. There is no guarding.  Musculoskeletal: Normal range of motion. He exhibits no edema or tenderness.  Lymphadenopathy:    He has no cervical adenopathy.  Neurological: He is alert and oriented to person, place, and time. No cranial nerve deficit. Gait normal.  Skin: Skin is warm and dry. No rash noted. No erythema. No pallor.  Psychiatric: Affect and judgment normal.  Nursing note and vitals reviewed.  LABORATORY DATA:  I have reviewed the data as listed CBC Latest Ref Rng & Units 01/24/2017 11/21/2016 10/08/2016  WBC 4.0 - 10.5 K/uL 4.5 4.2 5.1  Hemoglobin 13.0 - 17.0 g/dL 11.9(L) 11.5(L) 11.2(L)  Hematocrit 39.0 - 52.0 % 35.3(L) 35.8(L) 33.1(L)  Platelets 150 - 400 K/uL 130(L) 127(L) 154   CMP Latest Ref Rng & Units 01/24/2017 11/21/2016 07/25/2016  Glucose 65 - 99 mg/dL 94 178(H) 142(H)  BUN 6 - 20 mg/dL 13 15 11   Creatinine 0.61 - 1.24 mg/dL 1.46(H) 1.40(H) 1.29(H)  Sodium 135 - 145 mmol/L 140 138 136  Potassium 3.5 - 5.1 mmol/L 4.1 4.0 4.2  Chloride 101 - 111 mmol/L 105 103 101  CO2 22 - 32 mmol/L 30 28 31   Calcium 8.9 - 10.3 mg/dL 8.5(L) 8.3(L) 8.3(L)  Total Protein 6.5 - 8.1 g/dL 7.0 6.7 6.9  Total Bilirubin 0.3 - 1.2 mg/dL 0.5 0.4 0.5  Alkaline Phos 38 - 126 U/L 98 99 110  AST 15 - 41 U/L 16 19 19   ALT 17 - 63 U/L 16(L) 18 19   RADIOGRAPHIC STUDIES: I have personally reviewed the radiological images as listed and agreed with the findings in the report.  DG Chest 2 View 10/08/2016 IMPRESSION: Mild chronic bronchitic changes. No pneumonia, CHF, nor  other acute cardiopulmonary abnormality.  ASSESSMENT & PLAN:  T2a N0 Melanoma of the L Thigh s/p WLE and sentinel node with Dr. Freddi Che Abnormal CT and MRI imaging of the abdomen Anemia/Thrombocytopenia Colonoscopy 02/10/2015 with 5 polyps and sigmoid diverticulosis Iron deficiency with intolerance to oral iron Pancreatic cyst CKD  Labs reviewed today. Hgb is stable at 11.9 g/dL today. Suspect his anemia has a component of chronic anemia from CKD.  I have reviewed CT abd/pelvis from 07/2016 with the patient in detail today. He has a stable cyst on his pancreatic head. Per radiology recommendations, he will get a repeat scan in 07/2018 to assess stability.   It has been 13 years since his original melanoma diagnosis- he is cured.   He will return for follow up in 6 months with labs; CBC, CMP, iron studies, ferritin.   Orders Placed This Encounter  Procedures  . CBC with Differential    Standing Status:   Future    Standing Expiration Date:   01/24/2018  . Comprehensive metabolic panel    Standing Status:   Future    Standing Expiration Date:   01/24/2018  . Iron and TIBC    Standing Status:   Future    Standing Expiration Date:   01/24/2018  . Ferritin    Standing Status:   Future    Standing Expiration Date:  01/24/2018   All questions were answered. The patient knows to call the clinic with any problems, questions or concerns.   This document serves as a record of services personally performed by Twana First, MD. It was created on her behalf by Martinique Casey, a trained medical scribe. The creation of this record is based on the scribe's personal observations and the provider's statements to them. This document has been checked and approved by the attending provider.  I have reviewed the above documentation for accuracy and completeness, and I agree with the above.  This note was electronically signed.  Kelby Fam. Whitney Muse, MD

## 2017-01-24 NOTE — Patient Instructions (Signed)
[facility] at [facility] Discharge Instructions  RECOMMENDATIONS MADE BY THE CONSULTANT AND ANY TEST RESULTS WILL BE SENT TO YOUR REFERRING PHYSICIAN.  You were seen today by Dr. Louise Zhou Follow up in 6 months with lab work   Thank you for choosing [facility] at [facility] to provide your oncology and hematology care.  To afford each patient quality time with our provider, please arrive at least 15 minutes before your scheduled appointment time.    If you have a lab appointment with the Cancer Center please come in thru the  Main Entrance and check in at the main information desk  You need to re-schedule your appointment should you arrive 10 or more minutes late.  We strive to give you quality time with our providers, and arriving late affects you and other patients whose appointments are after yours.  Also, if you no show three or more times for appointments you may be dismissed from the clinic at the providers discretion.     Again, thank you for choosing [facility].  Our hope is that these requests will decrease the amount of time that you wait before being seen by our physicians.       _____________________________________________________________  Should you have questions after your visit to [facility], please contact our office at [phone] between the hours of 8:30 a.m. and 4:30 p.m.  Voicemails left after 4:30 p.m. will not be returned until the following business day.  For prescription refill requests, have your pharmacy contact our office.       Resources For Cancer Patients and their Caregivers ? American Cancer Society: Can assist with transportation, wigs, general needs, runs Look Good Feel Better.        1-888-227-6333 ? Cancer Care: Provides financial assistance, online support groups, medication/co-pay assistance.  1-800-813-HOPE (4673) ? [facility] Assists  [place] cancer patients and their families through emotional , educational and financial support.  336-427-4357 ? [place] DSS Where to apply for food stamps, Medicaid and utility assistance. 336-342-1394 ? [facility]: Transportation to medical appointments. 336-347-2287 ? Social Security Administration: May apply for disability if have a Stage IV cancer. 336-342-7796 1-800-772-1213 ? [place] Aging, Disability and Transit Services: Assists with nutrition, care and transit needs. 336-349-2343  Cancer Center Support Programs: @[email or web address]@ > Cancer Support Group  2nd Tuesday of the month 1pm-2pm, Journey Room  > Creative Journey  3rd Tuesday of the month 1130am-1pm, Journey Room  > Look Good Feel Better  1st Wednesday of the month 10am-12 noon, Journey Room (Call American Cancer Society to register 1-800-395-5775)    

## 2017-02-13 ENCOUNTER — Ambulatory Visit (INDEPENDENT_AMBULATORY_CARE_PROVIDER_SITE_OTHER): Payer: Medicare Other | Admitting: *Deleted

## 2017-02-13 DIAGNOSIS — I5022 Chronic systolic (congestive) heart failure: Secondary | ICD-10-CM

## 2017-02-13 DIAGNOSIS — I428 Other cardiomyopathies: Secondary | ICD-10-CM

## 2017-02-13 DIAGNOSIS — Z95810 Presence of automatic (implantable) cardiac defibrillator: Secondary | ICD-10-CM | POA: Diagnosis not present

## 2017-02-13 LAB — CUP PACEART REMOTE DEVICE CHECK
Battery Remaining Longevity: 87 mo
Battery Voltage: 2.99 V
Brady Statistic AP VS Percent: 1.58 %
Brady Statistic AS VS Percent: 0.06 %
Brady Statistic RV Percent Paced: 5.76 %
Date Time Interrogation Session: 20180516144058
HIGH POWER IMPEDANCE MEASURED VALUE: 69 Ohm
Implantable Lead Implant Date: 20160607
Implantable Lead Location: 753859
Implantable Lead Location: 753860
Implantable Lead Model: 4598
Implantable Pulse Generator Implant Date: 20160607
Lead Channel Impedance Value: 342 Ohm
Lead Channel Impedance Value: 399 Ohm
Lead Channel Impedance Value: 494 Ohm
Lead Channel Impedance Value: 513 Ohm
Lead Channel Impedance Value: 760 Ohm
Lead Channel Impedance Value: 779 Ohm
Lead Channel Impedance Value: 836 Ohm
Lead Channel Impedance Value: 912 Ohm
Lead Channel Impedance Value: 988 Ohm
Lead Channel Pacing Threshold Amplitude: 0.75 V
Lead Channel Pacing Threshold Amplitude: 0.75 V
Lead Channel Pacing Threshold Pulse Width: 0.4 ms
Lead Channel Sensing Intrinsic Amplitude: 12.625 mV
Lead Channel Sensing Intrinsic Amplitude: 12.625 mV
Lead Channel Sensing Intrinsic Amplitude: 2.25 mV
Lead Channel Setting Pacing Amplitude: 2 V
Lead Channel Setting Pacing Amplitude: 2.25 V
Lead Channel Setting Pacing Pulse Width: 0.4 ms
Lead Channel Setting Pacing Pulse Width: 0.4 ms
MDC IDC LEAD IMPLANT DT: 20160607
MDC IDC LEAD IMPLANT DT: 20160607
MDC IDC LEAD LOCATION: 753858
MDC IDC MSMT LEADCHNL LV IMPEDANCE VALUE: 513 Ohm
MDC IDC MSMT LEADCHNL LV IMPEDANCE VALUE: 646 Ohm
MDC IDC MSMT LEADCHNL LV IMPEDANCE VALUE: 950 Ohm
MDC IDC MSMT LEADCHNL LV PACING THRESHOLD PULSEWIDTH: 0.4 ms
MDC IDC MSMT LEADCHNL RA IMPEDANCE VALUE: 380 Ohm
MDC IDC MSMT LEADCHNL RA PACING THRESHOLD AMPLITUDE: 0.625 V
MDC IDC MSMT LEADCHNL RA SENSING INTR AMPL: 2.25 mV
MDC IDC MSMT LEADCHNL RV PACING THRESHOLD PULSEWIDTH: 0.4 ms
MDC IDC SET LEADCHNL RA PACING AMPLITUDE: 1.5 V
MDC IDC SET LEADCHNL RV SENSING SENSITIVITY: 0.3 mV
MDC IDC STAT BRADY AP VP PERCENT: 97.51 %
MDC IDC STAT BRADY AS VP PERCENT: 0.84 %
MDC IDC STAT BRADY RA PERCENT PACED: 99.04 %

## 2017-02-13 NOTE — Progress Notes (Signed)
Remote ICD transmission.   

## 2017-02-14 ENCOUNTER — Encounter: Payer: Self-pay | Admitting: Cardiology

## 2017-02-14 ENCOUNTER — Telehealth: Payer: Self-pay

## 2017-02-14 NOTE — Progress Notes (Signed)
EPIC Encounter for ICM Monitoring  Patient Name: Chad Brown is a 76 y.o. male Date: 02/14/2017 Primary Care Physican: Dione Housekeeper, MD Primary Cardiologist:Nishan/McLean Electrophysiologist: Allred Dry Weight:Last ICM weight224.2lbs Bi-V Pacing: 98.1%                                                     Attempted call to patient and unable to reach.  Left detailed message regarding transmission.  Transmission reviewed.    Thoracic impedance normal.  Prescribed dosage: Furosemide 20 mg 2 tablets (40 mg total) daily.  Potassium 20 mEq 1 tablet twice a day  Labs: 01/19/2017 Creatinine 1.46, BUN 13, Potassium 4.1, Sodium 140, EGFR 48-55 11/21/2016 Creatinine 1.40, BUN 15, Potassium 4.0, Sodium 138, EGFR 48-55 10/25/2017Creatinine 1.29, BUN 31, Potassium 4.2, Sodium 136, EGFR 53->60 05/21/2016 Creatinine 1.24, BUN 16, Potassium 4.2, Sodium 138, EGFR 55->60  03/19/2016 Creatinine 1.09, BUN 15, Potassium 4.6, Sodium 137, EGFR >60  01/19/2016 Creatinine 1.15, BUN 10, Potassium 4.2, Sodium 143, EGFR >60  11/04/2015 Creatinine 1.23, BUN 14, Potassium 4.0, Sodium 142, EGFR 56->60   Recommendations:  Left voice mail with ICM number and encouraged to call for fluid symptoms.  Follow-up plan: ICM clinic phone appointment on 03/19/2017.  Office appointment scheduled on 04/08/2017 with Dr Johnsie Cancel.  Copy of ICM check sent to device physician.   3 month ICM trend: 02/13/2017   1 Year ICM trend:      Rosalene Billings, RN 02/14/2017 1:00 PM

## 2017-02-14 NOTE — Telephone Encounter (Signed)
Remote ICM transmission received.  Attempted patient call and left detailed message regarding transmission and next ICM scheduled for 03/19/2017.  Advised to return call for any fluid symptoms or questions.

## 2017-03-05 ENCOUNTER — Other Ambulatory Visit (HOSPITAL_COMMUNITY): Payer: Self-pay | Admitting: Cardiology

## 2017-03-06 ENCOUNTER — Ambulatory Visit: Payer: Medicare Other | Attending: Orthopedic Surgery | Admitting: Physical Therapy

## 2017-03-06 DIAGNOSIS — R293 Abnormal posture: Secondary | ICD-10-CM | POA: Insufficient documentation

## 2017-03-06 DIAGNOSIS — M545 Low back pain: Secondary | ICD-10-CM | POA: Insufficient documentation

## 2017-03-06 NOTE — Therapy (Signed)
[facility] [place], [place], [address] Phone: [phone]   Fax:  [phone]  Physical Therapy Evaluation  Patient Details  Name: Chad Brown MRN: [id number] Date of Birth: 09-Mar-1941 Referring Provider: Elsie Saas MD.  Encounter Date: 03/06/2017      PT End of Session - 03/06/17 1140    Visit Number 1   Number of Visits 16   Date for PT Re-Evaluation 05/05/17   PT Start Time 0950   PT Stop Time 1040   PT Time Calculation (min) 50 min   Activity Tolerance Patient tolerated treatment well   Behavior During Therapy [facility] for tasks assessed/performed      Past Medical History:  Diagnosis Date  . Adenomatous colon polyp    tubular  . AICD (automatic cardioverter/defibrillator) present 03/08/2015   MDT CRTD   . Anemia    iron deficient  . Arthritis    "about all my joints; hands, knees, back" (03/08/2015)  . Atherosclerosis   . Cataract    left eye small  . Cholelithiasis    gallstones  . Chronic systolic CHF (congestive heart failure) ([place])    a. New dx 12/2012 ? NICM, may be r/t afib. b. Nuc 03/2013 - normal;  c. 03/2015 TEE EF 15-20%.  . Colon polyp, hyperplastic 5/16   removed precancerous lesions  . Depression   . Diverticulosis   . Glaucoma    right eye  . Hyperlipidemia   . Hypertension   . Melanoma of eye ([place]) 2000's   "right; it's never been biopsied"  . Melanoma of lower leg ([place]) 2015   "left; right at my knee"  . Myocardial infarction ([place]) 1998  . OSA (obstructive sleep apnea) 01/04/2016   uses cpap, pt does not know settings auto set  . Peripheral vision loss, right 2006  . Persistent atrial fibrillation ([place])    a. Dx 12/2012, s/p TEE/DCCV 01/26/13. b. On Xarelto (CHA2DS2VASc = 3);  c. 03/2015 TEE (EF 15-20%, no LAA thrombus) and DCCV - amio increased to 200 mg bid.  . Urinary hesitancy due to benign prostatic hypertrophy     Past Surgical History:  Procedure Laterality Date  . BACK  SURGERY     upper back, cannot turn neck well  . CARDIAC CATHETERIZATION  1998  . CARDIOVERSION N/A 01/26/2013   Procedure: CARDIOVERSION;  Surgeon: Lelon Perla, MD;  Location[facility] ENDOSCOPY;  Service: Cardiovascular;  Laterality: N/A;  . CARDIOVERSION N/A 03/23/2015   Procedure: CARDIOVERSION;  Surgeon: Jerline Pain, MD;  Location: [facility];  Service: Cardiovascular;  Laterality: N/A;  . CATARACT EXTRACTION Right ~ 2006  . COLONOSCOPY WITH PROPOFOL N/A 02/10/2015   Procedure: COLONOSCOPY WITH PROPOFOL;  Surgeon: Gatha Mayer, MD;  Location: [facility] ENDOSCOPY;  Service: Endoscopy;  Laterality: N/A;  . COLONOSCOPY WITH PROPOFOL N/A 08/07/2016   Procedure: COLONOSCOPY WITH PROPOFOL;  Surgeon: Gatha Mayer, MD;  Location: [facility] ENDOSCOPY;  Service: Endoscopy;  Laterality: N/A;  . ENTEROSCOPY N/A 08/17/2015   Procedure: ENTEROSCOPY;  Surgeon: Gatha Mayer, MD;  Location: [facility] ENDOSCOPY;  Service: Endoscopy;  Laterality: N/A;  . EP IMPLANTABLE DEVICE N/A 03/08/2015   MDT Hillery Aldo CRT-D for nonischemic CM by Dr Rayann Heman for primary prevention  . GLAUCOMA SURGERY Right ~ 2006   "put 3 stents in to drain fluid" (03/08/2015) not successful, sent to [facility] to try to get last stent out  . HOT HEMOSTASIS N/A 08/07/2016   Procedure: HOT HEMOSTASIS (ARGON PLASMA  COAGULATION/BICAP);  Surgeon: Gatha Mayer, MD;  Location: [facility] ENDOSCOPY;  Service: Endoscopy;  Laterality: N/A;  . INCISION AND DRAINAGE ABSCESS POSTERIOR CERVICALSPINE  05/2012  . JOINT REPLACEMENT    . MELANOMA EXCISION Left 2015   "lower leg; right at my knee"  . REFRACTIVE SURGERY Right ~ 2006 X 2   "twice; both done at [place]" (03/08/2015  . SURGERY SCROTAL / TESTICULAR Right 1990's  . TEE WITHOUT CARDIOVERSION N/A 01/26/2013   Procedure: TRANSESOPHAGEAL ECHOCARDIOGRAM (TEE);  Surgeon: Lelon Perla, MD;  Location: [facility];  Service: Cardiovascular;  Laterality: N/A;  [name] anes. /   . TEE WITHOUT CARDIOVERSION N/A 10/05/2014   Procedure:  TRANSESOPHAGEAL ECHOCARDIOGRAM (TEE)  with cardioversion;  Surgeon: Thayer Headings, MD;  Location[facility] ENDOSCOPY;  Service: Cardiovascular;  Laterality: N/A;  12:52 synched cardioversion at 120 joules,...afib to SR...12 lead EKG ordered.Marland KitchenMarland KitchenCardiozem d/c'ed per MD verbal order at SR  . TEE WITHOUT CARDIOVERSION N/A 03/23/2015   Procedure: TRANSESOPHAGEAL ECHOCARDIOGRAM (TEE);  Surgeon: Jerline Pain, MD;  Location: [facility] ENDOSCOPY;  Service: Cardiovascular;  Laterality: N/A;  . THORACIC SPINE SURGERY  03/2000   "ground calcium deposits from upper thoracic" (01/26/2013)  . TOTAL HIP ARTHROPLASTY Right 06/2007    There were no vitals filed for this visit.       Subjective Assessment - 03/06/17 1104    Subjective The patient presents to the clinic today with c/o left low back with radiation of pain into his left buttock region and posterior thigh to knee. He reports his pain started about 4 weeks ago.  Prior to being on Prednisone (completed on 03/04/17) he was in severe pain (10/10).  Certain movements increase his pain.   Pertinent History PACEMAKER.   How long can you sit comfortably? 5-10 minutes.   How long can you stand comfortably? 5 minutes.   How long can you walk comfortably? Short distances.   Patient Stated Goals Get out of pain.   Currently in Pain? Yes   Pain Score 8    Pain Location Back   Pain Orientation Left   Pain Descriptors / Indicators Aching;Sharp   Pain Radiating Towards Left LE.   Pain Onset More than a month ago   Pain Frequency Constant   Aggravating Factors  See above.   Pain Relieving Factors See above.            [facility] PT Assessment - 03/06/17 0001      Assessment   Medical Diagnosis Left sciatica.   Referring Provider Elsie Saas MD.   Onset Date/Surgical Date --  4 weeks.     Precautions   Precautions --  PACEMAKER.     Restrictions   Weight Bearing Restrictions No     Balance Screen   Has the patient fallen in the past 6 months Yes   How many  times? --  2.   Has the patient had a decrease in activity level because of a fear of falling?  Yes   Is the patient reluctant to leave their home because of a fear of falling?  No     Home Environment   Living Environment Private residence     Prior Function   Level of Independence Independent     Posture/Postural Control   Posture/Postural Control Postural limitations   Postural Limitations Rounded Shoulders;Forward head;Decreased lumbar lordosis;Flexed trunk  Bilateral genu varum.     ROM / Strength   AROM / PROM / Strength AROM;Strength  AROM   Overall AROM Comments lumbar extension limited to 5 degrees and flexion to 20 degrees.     Strength   Overall Strength Comments Normal bilateral LE strength.     Palpation   Palpation comment Tender to palpation over left lumbar erector spinae muscu;lature with active trigger points noted.     Special Tests    Special Tests --  (+) LT SLR test.  Absent left Achilles reflex.     Ambulation/Gait   Gait Comments The patient walks slowly and purposefully in spianl flexion.            Objective measurements completed on examination: See above findings.          [facility] Adult PT Treatment/Exercise - 03/06/17 0001      Modalities   Modalities Moist Heat     Moist Heat Therapy   Number Minutes Moist Heat 20 Minutes   Moist Heat Location Lumbar Spine                  PT Short Term Goals - 03/06/17 1152      PT SHORT TERM GOAL #1   Title STG's=LTG's.           PT Long Term Goals - 03/06/17 1152      PT LONG TERM GOAL #1   Title Independent with a HEP.   Time 8   Period Weeks   Status New     PT LONG TERM GOAL #2   Title Perform ADL's with pain not > 3/10.   Time 8   Period Weeks   Status New     PT LONG TERM GOAL #3   Title Eliminate left LE pain.   Time 8   Period Weeks   Status New                Plan - 03/06/17 1140    Clinical Impression Statement The patient reports  a severe onset of low back pain about 4 weeks ago.  He reports Prednisone was quite helpful.  He has a very significant loss of spinal range of motion, postural abnormalites and is very tender with active trigger points in his left lumbar musculature.  Deficits prevent him from performing ADL's and has impaired his functional mobility.  The patient will benefit from skilled physical therapy.   History and Personal Factors relevant to plan of care: PACEMAKER.   Clinical Presentation Stable   Clinical Decision Making Low   Rehab Potential Good   PT Frequency 2x / week   PT Duration 8 weeks   PT Treatment/Interventions ADLs/Self Care Home Management;Moist Heat;Functional mobility training;Therapeutic activities;Therapeutic exercise;Patient/family education;Manual techniques   PT Next Visit Plan PACEMAKER.  STW/M to left low back musculature; dry needling; progression into core exercises.   Consulted and Agree with Plan of Care Patient      Patient will benefit from skilled therapeutic intervention in order to improve the following deficits and impairments:  Decreased activity tolerance, Decreased range of motion, Postural dysfunction, Pain  Visit Diagnosis: Acute left-sided low back pain, with sciatica presence unspecified - Plan: PT plan of care cert/re-cert  Abnormal posture - Plan: PT plan of care cert/re-cert      G-Codes - 16/10/96 1214    Functional Assessment Tool Used (Outpatient Only) Clinical judgement....   Functional Limitation Mobility: Walking and moving around   Mobility: Walking and Moving Around Current Status [phone]) At least 40 percent but less than 60 percent impaired, limited or restricted  Mobility: Walking and Moving Around Goal Status [phone]) At least 20 percent but less than 40 percent impaired, limited or restricted       Problem List Patient Active Problem List   Diagnosis Date Noted  . Iron deficiency anemia 05/25/2016  . OSA (obstructive sleep apnea)  01/04/2016  . Heme positive stool   . Atrial fibrillation with RVR ([place]) 03/21/2015  . Chronic systolic CHF (congestive heart failure) ([place]) 03/08/2015  . Hx of adenomatous colonic polyps 02/10/2015  . Pancreatic cyst 02/10/2015  . Loose stools 02/10/2015  . Encounter for long-term (current) use of antiplatelets/antithrombotics 01/10/2015  . Hypotension 10/04/2014  . Nonischemic cardiomyopathy ([place]) 10/04/2014  . Hyperglycemia 10/04/2014  . Troponin level elevated 10/04/2014  . Sleep-disordered breathing 10/04/2014  . Obesity 10/04/2014  . Anemia, mild 10/04/2014  . Thrombocytopenia ([place]) 10/04/2014  . Atrial fibrillation with rapid ventricular response ([place]) 10/02/2014  . Sinus bradycardia 10/02/2014  . Acute on chronic systolic CHF (congestive heart failure) ([place]) 10/02/2014  . Skin cancer 07/21/2014  . cardiomyopathy 01/24/2013  . Atrial fibrillation ([place]) 01/22/2013  . Arthritis   . Hyperlipidemia   . Hypertension   . Infected sebaceous cyst 05/15/2012    Chad Brown, [place]  MPT 03/06/2017, 12:16 PM  [facility] [address] [place], [place], [address] Phone: [phone]   Fax:  [phone]  Name: Chad Brown MRN: [id number] Date of Birth: 1941-03-17

## 2017-03-08 ENCOUNTER — Ambulatory Visit: Payer: Medicare Other | Admitting: *Deleted

## 2017-03-08 DIAGNOSIS — M545 Low back pain: Secondary | ICD-10-CM

## 2017-03-08 DIAGNOSIS — R293 Abnormal posture: Secondary | ICD-10-CM

## 2017-03-08 NOTE — Therapy (Signed)
[facility] [place], [place], [address] Phone: [phone]   Fax:  [phone]  Physical Therapy Treatment  Patient Details  Name: Chad Brown MRN: [id number] Date of Birth: 1941-04-28 Referring Provider: Elsie Saas MD.  Encounter Date: 03/08/2017      PT End of Session - 03/08/17 0826    Visit Number 2   Number of Visits 16   Date for PT Re-Evaluation 05/05/17   PT Start Time 0815   PT Stop Time 0905   PT Time Calculation (min) 50 min      Past Medical History:  Diagnosis Date  . Adenomatous colon polyp    tubular  . AICD (automatic cardioverter/defibrillator) present 03/08/2015   MDT CRTD   . Anemia    iron deficient  . Arthritis    "about all my joints; hands, knees, back" (03/08/2015)  . Atherosclerosis   . Cataract    left eye small  . Cholelithiasis    gallstones  . Chronic systolic CHF (congestive heart failure) ([place])    a. New dx 12/2012 ? NICM, may be r/t afib. b. Nuc 03/2013 - normal;  c. 03/2015 TEE EF 15-20%.  . Colon polyp, hyperplastic 5/16   removed precancerous lesions  . Depression   . Diverticulosis   . Glaucoma    right eye  . Hyperlipidemia   . Hypertension   . Melanoma of eye ([place]) 2000's   "right; it's never been biopsied"  . Melanoma of lower leg ([place]) 2015   "left; right at my knee"  . Myocardial infarction ([place]) 1998  . OSA (obstructive sleep apnea) 01/04/2016   uses cpap, pt does not know settings auto set  . Peripheral vision loss, right 2006  . Persistent atrial fibrillation ([place])    a. Dx 12/2012, s/p TEE/DCCV 01/26/13. b. On Xarelto (CHA2DS2VASc = 3);  c. 03/2015 TEE (EF 15-20%, no LAA thrombus) and DCCV - amio increased to 200 mg bid.  . Urinary hesitancy due to benign prostatic hypertrophy     Past Surgical History:  Procedure Laterality Date  . BACK SURGERY     upper back, cannot turn neck well  . CARDIAC CATHETERIZATION  1998  . CARDIOVERSION N/A 01/26/2013   Procedure: CARDIOVERSION;  Surgeon: Lelon Perla, MD;  Location: [facility] ENDOSCOPY;  Service: Cardiovascular;  Laterality: N/A;  . CARDIOVERSION N/A 03/23/2015   Procedure: CARDIOVERSION;  Surgeon: Jerline Pain, MD;  Location: [facility];  Service: Cardiovascular;  Laterality: N/A;  . CATARACT EXTRACTION Right ~ 2006  . COLONOSCOPY WITH PROPOFOL N/A 02/10/2015   Procedure: COLONOSCOPY WITH PROPOFOL;  Surgeon: Gatha Mayer, MD;  Location: [facility] ENDOSCOPY;  Service: Endoscopy;  Laterality: N/A;  . COLONOSCOPY WITH PROPOFOL N/A 08/07/2016   Procedure: COLONOSCOPY WITH PROPOFOL;  Surgeon: Gatha Mayer, MD;  Location: [facility] ENDOSCOPY;  Service: Endoscopy;  Laterality: N/A;  . ENTEROSCOPY N/A 08/17/2015   Procedure: ENTEROSCOPY;  Surgeon: Gatha Mayer, MD;  Location: [facility] ENDOSCOPY;  Service: Endoscopy;  Laterality: N/A;  . EP IMPLANTABLE DEVICE N/A 03/08/2015   MDT Hillery Aldo CRT-D for nonischemic CM by Dr Rayann Heman for primary prevention  . GLAUCOMA SURGERY Right ~ 2006   "put 3 stents in to drain fluid" (03/08/2015) not successful, [facility] to try to get last stent out  . HOT HEMOSTASIS N/A 08/07/2016   Procedure: HOT HEMOSTASIS (ARGON PLASMA COAGULATION/BICAP);  Surgeon: Gatha Mayer, MD;  Location: [facility] ENDOSCOPY;  Service: Endoscopy;  Laterality: N/A;  .  INCISION AND DRAINAGE ABSCESS POSTERIOR CERVICALSPINE  05/2012  . JOINT REPLACEMENT    . MELANOMA EXCISION Left 2015   "lower leg; right at my knee"  . REFRACTIVE SURGERY Right ~ 2006 X 2   "twice; both done at [facility]" (03/08/2015  . SURGERY SCROTAL / TESTICULAR Right 1990's  . TEE WITHOUT CARDIOVERSION N/A 01/26/2013   Procedure: TRANSESOPHAGEAL ECHOCARDIOGRAM (TEE);  Surgeon: Lelon Perla, MD;  Location: [facility];  Service: Cardiovascular;  Laterality: N/A;  [name] anes. /   . TEE WITHOUT CARDIOVERSION N/A 10/05/2014   Procedure: TRANSESOPHAGEAL ECHOCARDIOGRAM (TEE)  with cardioversion;  Surgeon: Thayer Headings, MD;  Location: [facility] ENDOSCOPY;   Service: Cardiovascular;  Laterality: N/A;  12:52 synched cardioversion at 120 joules,...afib to SR...12 lead EKG ordered.Marland KitchenMarland KitchenCardiozem d/c'ed per MD verbal order at SR  . TEE WITHOUT CARDIOVERSION N/A 03/23/2015   Procedure: TRANSESOPHAGEAL ECHOCARDIOGRAM (TEE);  Surgeon: Jerline Pain, MD;  Location: [facility] ENDOSCOPY;  Service: Cardiovascular;  Laterality: N/A;  . THORACIC SPINE SURGERY  03/2000   "ground calcium deposits from upper thoracic" (01/26/2013)  . TOTAL HIP ARTHROPLASTY Right 06/2007    There were no vitals filed for this visit.      Subjective Assessment - 03/08/17 0816    Subjective The patient presents to the clinic today with c/o left low back with radiation of pain into his left buttock region and posterior thigh to knee. He reports his pain started about 4 weeks ago.  Prior to being on Prednisone (completed on 03/04/17) he was in severe pain (10/10).  Certain movements increase his pain.   Pertinent History PACEMAKER.   How long can you sit comfortably? 5-10 minutes.   How long can you stand comfortably? 5 minutes.   How long can you walk comfortably? Short distances.   Currently in Pain? Yes   Pain Score 7    Pain Location Back   Pain Orientation Left   Pain Descriptors / Indicators Aching;Sharp   Pain Onset More than a month ago                         [facility] Adult PT Treatment/Exercise - 03/08/17 0001      Exercises   Exercises Lumbar;Knee/Hip     Lumbar Exercises: Aerobic   Stationary Bike Nustep x 10 mins L4 with focus on posture     Lumbar Exercises: Standing   Scapular Retraction Strengthening;AROM;Both;Theraband  XTS pink 2x25 Rows focus on posture   Other Standing Lumbar Exercises Postural stretch with back against wall. pec stretch x 5 hold 30 secs   Other Standing Lumbar Exercises EIS x 10 for LB extension and  hip flexor stretch     Manual Therapy   Manual Therapy Soft tissue mobilization   Soft tissue mobilization STW/ IASTM to LT side LB  paras with Pt RT sidelying                  PT Short Term Goals - 03/06/17 1152      PT SHORT TERM GOAL #1   Title STG's=LTG's.           PT Long Term Goals - 03/06/17 1152      PT LONG TERM GOAL #1   Title Independent with a HEP.   Time 8   Period Weeks   Status New     PT LONG TERM GOAL #2   Title Perform ADL's with pain not > 3/10.   Time 8  Period Weeks   Status New     PT LONG TERM GOAL #3   Title Eliminate left LE pain.   Time 8   Period Weeks   Status New               Plan - 03/08/17 1829    Clinical Impression Statement Pt arrived to clinic today doing fairly well with moderate LBP. He was able to perform therex and posture exs today with minimal increase in pain. Rx focused on strengthening posterior musclature and stretching anterior.[name] HEP given   Rehab Potential Good   PT Frequency 2x / week   PT Duration 8 weeks   PT Treatment/Interventions ADLs/Self Care Home Management;Moist Heat;Functional mobility training;Therapeutic activities;Therapeutic exercise;Patient/family education;Manual techniques   PT Next Visit Plan PACEMAKER.  STW/M to left low back musculature; dry needling; progression into core exercises.  Positional traction.   Consulted and Agree with Plan of Care Patient      Patient will benefit from skilled therapeutic intervention in order to improve the following deficits and impairments:  Decreased activity tolerance, Decreased range of motion, Postural dysfunction, Pain  Visit Diagnosis: Acute left-sided low back pain, with sciatica presence unspecified  Abnormal posture     Problem List Patient Active Problem List   Diagnosis Date Noted  . Iron deficiency anemia 05/25/2016  . OSA (obstructive sleep apnea) 01/04/2016  . Heme positive stool   . Atrial fibrillation with RVR ([place]) 03/21/2015  . Chronic systolic CHF (congestive heart failure) ([place]) 03/08/2015  . Hx of adenomatous colonic polyps 02/10/2015  .  Pancreatic cyst 02/10/2015  . Loose stools 02/10/2015  . Encounter for long-term (current) use of antiplatelets/antithrombotics 01/10/2015  . Hypotension 10/04/2014  . Nonischemic cardiomyopathy ([place]) 10/04/2014  . Hyperglycemia 10/04/2014  . Troponin level elevated 10/04/2014  . Sleep-disordered breathing 10/04/2014  . Obesity 10/04/2014  . Anemia, mild 10/04/2014  . Thrombocytopenia ([place]) 10/04/2014  . Atrial fibrillation with rapid ventricular response ([place]) 10/02/2014  . Sinus bradycardia 10/02/2014  . Acute on chronic systolic CHF (congestive heart failure) ([place]) 10/02/2014  . Skin cancer 07/21/2014  . cardiomyopathy 01/24/2013  . Atrial fibrillation ([place]) 01/22/2013  . Arthritis   . Hyperlipidemia   . Hypertension   . Infected sebaceous cyst 05/15/2012    RAMSEUR,CHRIS, PTA 03/08/2017, 9:44 AM  [facility] [address] [place], [place], [address] Phone: [phone]   Fax:  [phone]  Name: KEIL PICKERING MRN: [id number] Date of Birth: 1941/09/18

## 2017-03-11 ENCOUNTER — Encounter: Payer: Medicare Other | Admitting: *Deleted

## 2017-03-13 ENCOUNTER — Ambulatory Visit: Payer: Medicare Other | Admitting: Physical Therapy

## 2017-03-13 ENCOUNTER — Encounter: Payer: Self-pay | Admitting: Physical Therapy

## 2017-03-13 DIAGNOSIS — M545 Low back pain: Secondary | ICD-10-CM

## 2017-03-13 DIAGNOSIS — R293 Abnormal posture: Secondary | ICD-10-CM

## 2017-03-13 NOTE — Therapy (Signed)
[facility] [place], [place], [address] Phone: [phone]   Fax:  [phone]  Physical Therapy Treatment  Patient Details  Name: Chad Brown MRN: [id number] Date of Birth: 10/09/1940 Referring Provider: Elsie Saas MD.  Encounter Date: 03/13/2017      PT End of Session - 03/13/17 0854    Visit Number 3   Number of Visits 16   Date for PT Re-Evaluation 05/05/17   PT Start Time 0815   PT Stop Time 0858   PT Time Calculation (min) 43 min   Activity Tolerance Patient tolerated treatment well   Behavior During Therapy [facility] for tasks assessed/performed      Past Medical History:  Diagnosis Date  . Adenomatous colon polyp    tubular  . AICD (automatic cardioverter/defibrillator) present 03/08/2015   MDT CRTD   . Anemia    iron deficient  . Arthritis    "about all my joints; hands, knees, back" (03/08/2015)  . Atherosclerosis   . Cataract    left eye small  . Cholelithiasis    gallstones  . Chronic systolic CHF (congestive heart failure) ([place])    a. New dx 12/2012 ? NICM, may be r/t afib. b. Nuc 03/2013 - normal;  c. 03/2015 TEE EF 15-20%.  . Colon polyp, hyperplastic 5/16   removed precancerous lesions  . Depression   . Diverticulosis   . Glaucoma    right eye  . Hyperlipidemia   . Hypertension   . Melanoma of eye ([place]) 2000's   "right; it's never been biopsied"  . Melanoma of lower leg ([place]) 2015   "left; right at my knee"  . Myocardial infarction ([place]) 1998  . OSA (obstructive sleep apnea) 01/04/2016   uses cpap, pt does not know settings auto set  . Peripheral vision loss, right 2006  . Persistent atrial fibrillation ([place])    a. Dx 12/2012, s/p TEE/DCCV 01/26/13. b. On Xarelto (CHA2DS2VASc = 3);  c. 03/2015 TEE (EF 15-20%, no LAA thrombus) and DCCV - amio increased to 200 mg bid.  . Urinary hesitancy due to benign prostatic hypertrophy     Past Surgical History:  Procedure Laterality Date  . BACK  SURGERY     upper back, cannot turn neck well  . CARDIAC CATHETERIZATION  1998  . CARDIOVERSION N/A 01/26/2013   Procedure: CARDIOVERSION;  Surgeon: Lelon Perla, MD;  Location: [facility] ENDOSCOPY;  Service: Cardiovascular;  Laterality: N/A;  . CARDIOVERSION N/A 03/23/2015   Procedure: CARDIOVERSION;  Surgeon: Jerline Pain, MD;  Location: [facility];  Service: Cardiovascular;  Laterality: N/A;  . CATARACT EXTRACTION Right ~ 2006  . COLONOSCOPY WITH PROPOFOL N/A 02/10/2015   Procedure: COLONOSCOPY WITH PROPOFOL;  Surgeon: Gatha Mayer, MD;  Location: [facility] ENDOSCOPY;  Service: Endoscopy;  Laterality: N/A;  . COLONOSCOPY WITH PROPOFOL N/A 08/07/2016   Procedure: COLONOSCOPY WITH PROPOFOL;  Surgeon: Gatha Mayer, MD;  Location: [facility] ENDOSCOPY;  Service: Endoscopy;  Laterality: N/A;  . ENTEROSCOPY N/A 08/17/2015   Procedure: ENTEROSCOPY;  Surgeon: Gatha Mayer, MD;  Location: [facility] ENDOSCOPY;  Service: Endoscopy;  Laterality: N/A;  . EP IMPLANTABLE DEVICE N/A 03/08/2015   MDT Hillery Aldo CRT-D for nonischemic CM by Dr Rayann Heman for primary prevention  . GLAUCOMA SURGERY Right ~ 2006   "put 3 stents in to drain fluid" (03/08/2015) not successful, [facility] to [facility] to get [facility] stent out  . HOT HEMOSTASIS N/A 08/07/2016   Procedure: HOT HEMOSTASIS (ARGON PLASMA  COAGULATION/BICAP);  Surgeon: Gatha Mayer, MD;  Location: [facility] ENDOSCOPY;  Service: Endoscopy;  Laterality: N/A;  . INCISION AND DRAINAGE ABSCESS POSTERIOR CERVICALSPINE  05/2012  . JOINT REPLACEMENT    . MELANOMA EXCISION Left 2015   "lower leg; right at my knee"  . REFRACTIVE SURGERY Right ~ 2006 X 2   "twice; both done at [place]" (03/08/2015  . SURGERY SCROTAL / TESTICULAR Right 1990's  . TEE WITHOUT CARDIOVERSION N/A 01/26/2013   Procedure: TRANSESOPHAGEAL ECHOCARDIOGRAM (TEE);  Surgeon: Lelon Perla, MD;  Location: [facility];  Service: Cardiovascular;  Laterality: N/A;  [name] anes. /   . TEE WITHOUT CARDIOVERSION N/A 10/05/2014   Procedure:  TRANSESOPHAGEAL ECHOCARDIOGRAM (TEE)  with cardioversion;  Surgeon: Thayer Headings, MD;  Location: [facility] ENDOSCOPY;  Service: Cardiovascular;  Laterality: N/A;  12:52 synched cardioversion at 120 joules,...afib to SR...12 lead EKG ordered.Marland KitchenMarland KitchenCardiozem d/c'ed per MD verbal order at SR  . TEE WITHOUT CARDIOVERSION N/A 03/23/2015   Procedure: TRANSESOPHAGEAL ECHOCARDIOGRAM (TEE);  Surgeon: Jerline Pain, MD;  Location: [facility] ENDOSCOPY;  Service: Cardiovascular;  Laterality: N/A;  . THORACIC SPINE SURGERY  03/2000   "ground calcium deposits from upper thoracic" (01/26/2013)  . TOTAL HIP ARTHROPLASTY Right 06/2007    There were no vitals filed for this visit.      Subjective Assessment - 03/13/17 0817    Subjective Patient reported the same in pain thus far yet did ok after last tretment    Pertinent History PACEMAKER.   How long can you sit comfortably? 5-10 minutes.   How long can you stand comfortably? 5 minutes.   How long can you walk comfortably? Short distances.   Patient Stated Goals Get out of pain.   Currently in Pain? Yes   Pain Score 5    Pain Location Back   Pain Orientation Left   Pain Descriptors / Indicators Aching;Sharp   Pain Onset More than a month ago   Aggravating Factors  prolong sitting or standing   Pain Relieving Factors at rest                         [facility] Adult PT Treatment/Exercise - 03/13/17 0001      Lumbar Exercises: Aerobic   Stationary Bike Nustep x 15 mins L5 with focus on posture     Lumbar Exercises: Standing   Scapular Retraction Strengthening;AROM;Both;Theraband  2x20   Theraband Level (Scapular Retraction) Other (comment)  pink XTS   Shoulder Extension Strengthening;Both;Theraband  2x20   Theraband Level (Shoulder Extension) Other (comment)  pnk XTS   Other Standing Lumbar Exercises Postural stretch with back against wall. pec stretch x 5 hold 30 secs   Other Standing Lumbar Exercises standing hip flexor stretch with UEinto flexion  1 x each side     Lumbar Exercises: Supine   Ab Set 20 reps;3 seconds   Glut Set 3 seconds;20 reps   Bent Knee Raise 3 seconds;15 reps  each LE core focus   Bridge 10 reps;2 seconds   Straight Leg Raise 3 seconds;10 reps  each LE core focus   Straight Leg Raises Limitations some increased pain   Other Supine Lumbar Exercises sitting for posture/draw ins "W", "V" 2x10 each   Other Supine Lumbar Exercises sitting posture/core focus with 2# ball  side to side                  PT Short Term Goals - 03/06/17 1152  PT SHORT TERM GOAL #1   Title STG's=LTG's.           PT Long Term Goals - 03/13/17 0856      PT LONG TERM GOAL #1   Title Independent with a HEP.   Time 8   Period Weeks   Status On-going     PT LONG TERM GOAL #2   Title Perform ADL's with pain not > 3/10.   Time 8   Period Weeks   Status On-going     PT LONG TERM GOAL #3   Title Eliminate left LE pain.   Time 8   Period Weeks   Status On-going               Plan - 03/13/17 0857    Clinical Impression Statement Patient tolerated treatment well today and reported only some increased pain with bridges. May modify next treatment accordingly. Today focused treatment on core activation and posture focus. Educated patient on posture awareness techniques with all positions and ADL's. Patient progressing yet ongoing due to pain deficts.    Rehab Potential Good   PT Frequency 2x / week   PT Duration 8 weeks   PT Treatment/Interventions ADLs/Self Care Home Management;Moist Heat;Functional mobility training;Therapeutic activities;Therapeutic exercise;Patient/family education;Manual techniques   PT Next Visit Plan PACEMAKER.  STW/M to left low back musculature; dry needling; progression into core exercises.  Positional traction.   Consulted and Agree with Plan of Care Patient      Patient will benefit from skilled therapeutic intervention in order to improve the following deficits and  impairments:  Decreased activity tolerance, Decreased range of motion, Postural dysfunction, Pain  Visit Diagnosis: Acute left-sided low back pain, with sciatica presence unspecified  Abnormal posture     Problem List Patient Active Problem List   Diagnosis Date Noted  . Iron deficiency anemia 05/25/2016  . OSA (obstructive sleep apnea) 01/04/2016  . Heme positive stool   . Atrial fibrillation with RVR ([place]) 03/21/2015  . Chronic systolic CHF (congestive heart failure) ([place]) 03/08/2015  . Hx of adenomatous colonic polyps 02/10/2015  . Pancreatic cyst 02/10/2015  . Loose stools 02/10/2015  . Encounter for long-term (current) use of antiplatelets/antithrombotics 01/10/2015  . Hypotension 10/04/2014  . Nonischemic cardiomyopathy ([place]) 10/04/2014  . Hyperglycemia 10/04/2014  . Troponin level elevated 10/04/2014  . Sleep-disordered breathing 10/04/2014  . Obesity 10/04/2014  . Anemia, mild 10/04/2014  . Thrombocytopenia ([place]) 10/04/2014  . Atrial fibrillation with rapid ventricular response ([place]) 10/02/2014  . Sinus bradycardia 10/02/2014  . Acute on chronic systolic CHF (congestive heart failure) ([place]) 10/02/2014  . Skin cancer 07/21/2014  . cardiomyopathy 01/24/2013  . Atrial fibrillation ([place]) 01/22/2013  . Arthritis   . Hyperlipidemia   . Hypertension   . Infected sebaceous cyst 05/15/2012    Annemarie Sebree P, PTA 03/13/2017, 9:00 AM  [facility] [place], [place], [address] Phone: [phone]   Fax:  [phone]  Name: Chad Brown MRN: [id number] Date of Birth: 1941-08-08

## 2017-03-15 ENCOUNTER — Ambulatory Visit: Payer: Medicare Other | Admitting: *Deleted

## 2017-03-15 DIAGNOSIS — R293 Abnormal posture: Secondary | ICD-10-CM

## 2017-03-15 DIAGNOSIS — M545 Low back pain: Secondary | ICD-10-CM | POA: Diagnosis not present

## 2017-03-15 NOTE — Therapy (Signed)
[facility] [place], [place], [address] Phone: [phone]   Fax:  [phone]  Physical Therapy Treatment  Patient Details  Name: Chad Brown MRN: [id number] Date of Birth: 1941/09/12 Referring Provider: Elsie Saas MD.  Encounter Date: 03/15/2017      PT End of Session - 03/15/17 0836    Visit Number 4   Number of Visits 16   Date for PT Re-Evaluation 05/05/17   PT Start Time 0815   PT Stop Time 0906   PT Time Calculation (min) 51 min      Past Medical History:  Diagnosis Date  . Adenomatous colon polyp    tubular  . AICD (automatic cardioverter/defibrillator) present 03/08/2015   MDT CRTD   . Anemia    iron deficient  . Arthritis    "about all my joints; hands, knees, back" (03/08/2015)  . Atherosclerosis   . Cataract    left eye small  . Cholelithiasis    gallstones  . Chronic systolic CHF (congestive heart failure) ([place])    a. New dx 12/2012 ? NICM, may be r/t afib. b. Nuc 03/2013 - normal;  c. 03/2015 TEE EF 15-20%.  . Colon polyp, hyperplastic 5/16   removed precancerous lesions  . Depression   . Diverticulosis   . Glaucoma    right eye  . Hyperlipidemia   . Hypertension   . Melanoma of eye ([place]) 2000's   "right; it's never been biopsied"  . Melanoma of lower leg ([place]) 2015   "left; right at my knee"  . Myocardial infarction ([place]) 1998  . OSA (obstructive sleep apnea) 01/04/2016   uses cpap, pt does not know settings auto set  . Peripheral vision loss, right 2006  . Persistent atrial fibrillation ([place])    a. Dx 12/2012, s/p TEE/DCCV 01/26/13. b. On Xarelto (CHA2DS2VASc = 3);  c. 03/2015 TEE (EF 15-20%, no LAA thrombus) and DCCV - amio increased to 200 mg bid.  . Urinary hesitancy due to benign prostatic hypertrophy     Past Surgical History:  Procedure Laterality Date  . BACK SURGERY     upper back, cannot turn neck well  . CARDIAC CATHETERIZATION  1998  . CARDIOVERSION N/A 01/26/2013    Procedure: CARDIOVERSION;  Surgeon: Lelon Perla, MD;  Location: [facility] ENDOSCOPY;  Service: Cardiovascular;  Laterality: N/A;  . CARDIOVERSION N/A 03/23/2015   Procedure: CARDIOVERSION;  Surgeon: Jerline Pain, MD;  Location: [place];  Service: Cardiovascular;  Laterality: N/A;  . CATARACT EXTRACTION Right ~ 2006  . COLONOSCOPY WITH PROPOFOL N/A 02/10/2015   Procedure: COLONOSCOPY WITH PROPOFOL;  Surgeon: Gatha Mayer, MD;  Location: [facility] ENDOSCOPY;  Service: Endoscopy;  Laterality: N/A;  . COLONOSCOPY WITH PROPOFOL N/A 08/07/2016   Procedure: COLONOSCOPY WITH PROPOFOL;  Surgeon: Gatha Mayer, MD;  Location: [facility] ENDOSCOPY;  Service: Endoscopy;  Laterality: N/A;  . ENTEROSCOPY N/A 08/17/2015   Procedure: ENTEROSCOPY;  Surgeon: Gatha Mayer, MD;  Location: [facility] ENDOSCOPY;  Service: Endoscopy;  Laterality: N/A;  . EP IMPLANTABLE DEVICE N/A 03/08/2015   MDT Hillery Aldo CRT-D for nonischemic CM by Dr Rayann Heman for primary prevention  . GLAUCOMA SURGERY Right ~ 2006   "put 3 stents in to drain fluid" (03/08/2015) not successful, sent to [facility] to try to get last stent out  . HOT HEMOSTASIS N/A 08/07/2016   Procedure: HOT HEMOSTASIS (ARGON PLASMA COAGULATION/BICAP);  Surgeon: Gatha Mayer, MD;  Location: [facility] ENDOSCOPY;  Service: Endoscopy;  Laterality: N/A;  .  INCISION AND DRAINAGE ABSCESS POSTERIOR CERVICALSPINE  05/2012  . JOINT REPLACEMENT    . MELANOMA EXCISION Left 2015   "lower leg; right at my knee"  . REFRACTIVE SURGERY Right ~ 2006 X 2   "twice; both done at [place]" (03/08/2015  . SURGERY SCROTAL / TESTICULAR Right 1990's  . TEE WITHOUT CARDIOVERSION N/A 01/26/2013   Procedure: TRANSESOPHAGEAL ECHOCARDIOGRAM (TEE);  Surgeon: Lelon Perla, MD;  Location: [place];  Service: Cardiovascular;  Laterality: N/A;  [name] anes. /   . TEE WITHOUT CARDIOVERSION N/A 10/05/2014   Procedure: TRANSESOPHAGEAL ECHOCARDIOGRAM (TEE)  with cardioversion;  Surgeon: Thayer Headings, MD;  Location: [facility] ENDOSCOPY;   Service: Cardiovascular;  Laterality: N/A;  12:52 synched cardioversion at 120 joules,...afib to SR...12 lead EKG ordered.Marland KitchenMarland KitchenCardiozem d/c'ed per MD verbal order at SR  . TEE WITHOUT CARDIOVERSION N/A 03/23/2015   Procedure: TRANSESOPHAGEAL ECHOCARDIOGRAM (TEE);  Surgeon: Jerline Pain, MD;  Location: [facility] ENDOSCOPY;  Service: Cardiovascular;  Laterality: N/A;  . THORACIC SPINE SURGERY  03/2000   "ground calcium deposits from upper thoracic" (01/26/2013)  . TOTAL HIP ARTHROPLASTY Right 06/2007    There were no vitals filed for this visit.      Subjective Assessment - 03/15/17 0825    Subjective Patient reported the same in pain thus far yet did ok after last tretment just sore   Pertinent History PACEMAKER.   How long can you sit comfortably? 5-10 minutes.   How long can you stand comfortably? 5 minutes.   How long can you walk comfortably? Short distances.   Patient Stated Goals Get out of pain.   Currently in Pain? Yes   Pain Score 3    Pain Location Back   Pain Orientation Left   Pain Descriptors / Indicators Aching;Sharp   Pain Onset More than a month ago                         [facility] Adult PT Treatment/Exercise - 03/15/17 0001      Lumbar Exercises: Aerobic   Stationary Bike Nustep x 15 mins L5 with focus on posture     Lumbar Exercises: Standing   Scapular Retraction Strengthening;Both;Theraband  4x10   Theraband Level (Scapular Retraction) Other (comment)  pink XTS   Shoulder Extension Strengthening;Both;Theraband;20 reps;10 reps  3x10   Theraband Level (Shoulder Extension) Other (comment)  pnk XTS   Other Standing Lumbar Exercises Postural stretch with back against Ball on  wall. pec stretch x 10 hold 10 secs and scap retractions x 10 hold 10 secs   Other Standing Lumbar Exercises EIS x 10 for LB extension and  hip flexor stretch     Lumbar Exercises: Supine   Ab Set 20 reps;3 seconds   Bent Knee Raise 3 seconds;15 reps   Bridge 10 reps;2 seconds    Straight Leg Raise 3 seconds;10 reps   Straight Leg Raises Limitations some increased pain   Other Supine Lumbar Exercises sitting posture/core focus with 2# ball  side to side                  PT Short Term Goals - 03/06/17 1152      PT SHORT TERM GOAL #1   Title STG's=LTG's.           PT Long Term Goals - 03/13/17 0856      PT LONG TERM GOAL #1   Title Independent with a HEP.   Time 8   Period Weeks  Status On-going     PT LONG TERM GOAL #2   Title Perform ADL's with pain not > 3/10.   Time 8   Period Weeks   Status On-going     PT LONG TERM GOAL #3   Title Eliminate left LE pain.   Time 8   Period Weeks   Status On-going               Plan - 03/15/17 0837    Clinical Impression Statement Pt arrived to clinic today with decreased LB pain 3/10. He feels that he is doing a littlle better and is able to to sit and stand a little longer. Rx focused on improving posture awareness and core strengthening. LTG's ongoing due to deficits. Continue skilled PT.   History and Personal Factors relevant to plan of care: pacemaker   Clinical Presentation Stable   Clinical Decision Making Low   Rehab Potential Good   PT Frequency 2x / week   PT Duration 8 weeks   PT Treatment/Interventions ADLs/Self Care Home Management;Moist Heat;Functional mobility training;Therapeutic activities;Therapeutic exercise;Patient/family education;Manual techniques   PT Next Visit Plan PACEMAKER.  STW/M to left low back musculature; dry needling; progression into core exercises.  Positional traction.   Consulted and Agree with Plan of Care Patient      Patient will benefit from skilled therapeutic intervention in order to improve the following deficits and impairments:  Decreased activity tolerance, Decreased range of motion, Postural dysfunction, Pain  Visit Diagnosis: Acute left-sided low back pain, with sciatica presence unspecified  Abnormal posture     Problem  List Patient Active Problem List   Diagnosis Date Noted  . Iron deficiency anemia 05/25/2016  . OSA (obstructive sleep apnea) 01/04/2016  . Heme positive stool   . Atrial fibrillation with RVR ([place]) 03/21/2015  . Chronic systolic CHF (congestive heart failure) ([place]) 03/08/2015  . Hx of adenomatous colonic polyps 02/10/2015  . Pancreatic cyst 02/10/2015  . Loose stools 02/10/2015  . Encounter for long-term (current) use of antiplatelets/antithrombotics 01/10/2015  . Hypotension 10/04/2014  . Nonischemic cardiomyopathy ([place]) 10/04/2014  . Hyperglycemia 10/04/2014  . Troponin level elevated 10/04/2014  . Sleep-disordered breathing 10/04/2014  . Obesity 10/04/2014  . Anemia, mild 10/04/2014  . Thrombocytopenia ([place]) 10/04/2014  . Atrial fibrillation with rapid ventricular response ([place]) 10/02/2014  . Sinus bradycardia 10/02/2014  . Acute on chronic systolic CHF (congestive heart failure) ([place]) 10/02/2014  . Skin cancer 07/21/2014  . cardiomyopathy 01/24/2013  . Atrial fibrillation ([place]) 01/22/2013  . Arthritis   . Hyperlipidemia   . Hypertension   . Infected sebaceous cyst 05/15/2012    RAMSEUR,CHRIS, PTA 03/15/2017, 9:13 AM  [facility] [place], [place], [address] Phone: [phone]   Fax:  [phone]  Name: Chad Brown MRN: [id number] Date of Birth: 1941/01/03

## 2017-03-18 ENCOUNTER — Encounter: Payer: Medicare Other | Admitting: *Deleted

## 2017-03-19 ENCOUNTER — Telehealth: Payer: Self-pay | Admitting: Cardiology

## 2017-03-19 ENCOUNTER — Ambulatory Visit (INDEPENDENT_AMBULATORY_CARE_PROVIDER_SITE_OTHER): Payer: Medicare Other

## 2017-03-19 DIAGNOSIS — I5022 Chronic systolic (congestive) heart failure: Secondary | ICD-10-CM

## 2017-03-19 DIAGNOSIS — Z95810 Presence of automatic (implantable) cardiac defibrillator: Secondary | ICD-10-CM

## 2017-03-19 NOTE — Telephone Encounter (Signed)
Spoke with pt and reminded pt of remote transmission that is due today. Pt verbalized understanding.   

## 2017-03-20 ENCOUNTER — Encounter: Payer: Self-pay | Admitting: Physical Therapy

## 2017-03-20 ENCOUNTER — Ambulatory Visit: Payer: Medicare Other | Admitting: Physical Therapy

## 2017-03-20 DIAGNOSIS — M545 Low back pain: Secondary | ICD-10-CM | POA: Diagnosis not present

## 2017-03-20 DIAGNOSIS — R293 Abnormal posture: Secondary | ICD-10-CM

## 2017-03-20 NOTE — Therapy (Signed)
[facility] [place], [place], [address] Phone: [phone]   Fax:  [phone]  Physical Therapy Treatment  Patient Details  Name: Chad Brown MRN: [id number] Date of Birth: Mar 01, 1941 Referring Provider: Elsie Saas MD.  Encounter Date: 03/20/2017      PT End of Session - 03/20/17 0851    Visit Number 5   Number of Visits 16   Date for PT Re-Evaluation 05/05/17   PT Start Time 0814   PT Stop Time 0855   PT Time Calculation (min) 41 min   Activity Tolerance Patient tolerated treatment well   Behavior During Therapy [facility] for tasks assessed/performed      Past Medical History:  Diagnosis Date  . Adenomatous colon polyp    tubular  . AICD (automatic cardioverter/defibrillator) present 03/08/2015   MDT CRTD   . Anemia    iron deficient  . Arthritis    "about all my joints; hands, knees, back" (03/08/2015)  . Atherosclerosis   . Cataract    left eye small  . Cholelithiasis    gallstones  . Chronic systolic CHF (congestive heart failure) ([place])    a. New dx 12/2012 ? NICM, may be r/t afib. b. Nuc 03/2013 - normal;  c. 03/2015 TEE EF 15-20%.  . Colon polyp, hyperplastic 5/16   removed precancerous lesions  . Depression   . Diverticulosis   . Glaucoma    right eye  . Hyperlipidemia   . Hypertension   . Melanoma of eye ([place]) 2000's   "right; it's never been biopsied"  . Melanoma of lower leg ([place]) 2015   "left; right at my knee"  . Myocardial infarction ([place]) 1998  . OSA (obstructive sleep apnea) 01/04/2016   uses cpap, pt does not know settings auto set  . Peripheral vision loss, right 2006  . Persistent atrial fibrillation ([place])    a. Dx 12/2012, s/Brown TEE/DCCV 01/26/13. b. On Xarelto (CHA2DS2VASc = 3);  c. 03/2015 TEE (EF 15-20%, no LAA thrombus) and DCCV - amio increased to 200 mg bid.  . Urinary hesitancy due to benign prostatic hypertrophy     Past Surgical History:  Procedure Laterality Date  . BACK  SURGERY     upper back, cannot turn neck well  . CARDIAC CATHETERIZATION  1998  . CARDIOVERSION N/A 01/26/2013   Procedure: CARDIOVERSION;  Surgeon: Lelon Perla, MD;  Location: [facility] ENDOSCOPY;  Service: Cardiovascular;  Laterality: N/A;  . CARDIOVERSION N/A 03/23/2015   Procedure: CARDIOVERSION;  Surgeon: Jerline Pain, MD;  Location: [place];  Service: Cardiovascular;  Laterality: N/A;  . CATARACT EXTRACTION Right ~ 2006  . COLONOSCOPY WITH PROPOFOL N/A 02/10/2015   Procedure: COLONOSCOPY WITH PROPOFOL;  Surgeon: Gatha Mayer, MD;  Location: [facility] ENDOSCOPY;  Service: Endoscopy;  Laterality: N/A;  . COLONOSCOPY WITH PROPOFOL N/A 08/07/2016   Procedure: COLONOSCOPY WITH PROPOFOL;  Surgeon: Gatha Mayer, MD;  Location: [facility] ENDOSCOPY;  Service: Endoscopy;  Laterality: N/A;  . ENTEROSCOPY N/A 08/17/2015   Procedure: ENTEROSCOPY;  Surgeon: Gatha Mayer, MD;  Location: [facility] ENDOSCOPY;  Service: Endoscopy;  Laterality: N/A;  . EP IMPLANTABLE DEVICE N/A 03/08/2015   MDT Hillery Aldo CRT-D for nonischemic CM by Dr Rayann Heman for primary prevention  . GLAUCOMA SURGERY Right ~ 2006   "put 3 stents in to drain fluid" (03/08/2015) not successful, sent [facility] to try to get last stent out  . HOT HEMOSTASIS N/A 08/07/2016   Procedure: HOT HEMOSTASIS (ARGON PLASMA  COAGULATION/BICAP);  Surgeon: Gatha Mayer, MD;  Location: [facility] ENDOSCOPY;  Service: Endoscopy;  Laterality: N/A;  . INCISION AND DRAINAGE ABSCESS POSTERIOR CERVICALSPINE  05/2012  . JOINT REPLACEMENT    . MELANOMA EXCISION Left 2015   "lower leg; right at my knee"  . REFRACTIVE SURGERY Right ~ 2006 X 2   "twice; both done at [place]" (03/08/2015  . SURGERY SCROTAL / TESTICULAR Right 1990's  . TEE WITHOUT CARDIOVERSION N/A 01/26/2013   Procedure: TRANSESOPHAGEAL ECHOCARDIOGRAM (TEE);  Surgeon: Lelon Perla, MD;  Location: [place];  Service: Cardiovascular;  Laterality: N/A;  [name] anes. /   . TEE WITHOUT CARDIOVERSION N/A 10/05/2014   Procedure:  TRANSESOPHAGEAL ECHOCARDIOGRAM (TEE)  with cardioversion;  Surgeon: Thayer Headings, MD;  Location: [facility] ENDOSCOPY;  Service: Cardiovascular;  Laterality: N/A;  12:52 synched cardioversion at 120 joules,...afib to SR...12 lead EKG ordered.Marland KitchenMarland KitchenCardiozem d/c'ed per MD verbal order at SR  . TEE WITHOUT CARDIOVERSION N/A 03/23/2015   Procedure: TRANSESOPHAGEAL ECHOCARDIOGRAM (TEE);  Surgeon: Jerline Pain, MD;  Location: [facility] ENDOSCOPY;  Service: Cardiovascular;  Laterality: N/A;  . THORACIC SPINE SURGERY  03/2000   "ground calcium deposits from upper thoracic" (01/26/2013)  . TOTAL HIP ARTHROPLASTY Right 06/2007    There were no vitals filed for this visit.      Subjective Assessment - 03/20/17 0818    Subjective Patient reported pain in left LE is gone and is only in back now   Pertinent History PACEMAKER.   How long can you sit comfortably? 5-10 minutes.   How long can you stand comfortably? 5 minutes.   How long can you walk comfortably? Short distances.   Patient Stated Goals Get out of pain.   Currently in Pain? Yes   Pain Score 2    Pain Location Back   Pain Orientation Left   Pain Descriptors / Indicators Sore   Pain Onset More than a month ago   Pain Frequency Intermittent   Aggravating Factors  prolong position in sitting or standing   Pain Relieving Factors at rest                         [facility] Adult PT Treatment/Exercise - 03/20/17 0001      Lumbar Exercises: Aerobic   Stationary Bike Nustep x 15 mins L3-5 with focus on posture     Lumbar Exercises: Standing   Scapular Retraction Strengthening;Both;Theraband  4x10   Theraband Level (Scapular Retraction) Other (comment)  pink XTS   Shoulder Extension Strengthening;Both;Theraband;20 reps;10 reps  4x10   Theraband Level (Shoulder Extension) Other (comment)  pink XTS   Other Standing Lumbar Exercises Postural stretch with back against Ball on  wall. pec stretch x 10 hold 10 secs and scap retractions x 10 hold 10  secs   Other Standing Lumbar Exercises EIS x 10 for LB extension and  hip flexor stretch     Lumbar Exercises: Supine   Ab Set 20 reps;3 seconds   Glut Set 3 seconds;20 reps   Bent Knee Raise 3 seconds;15 reps   Bridge 10 reps;2 seconds   Straight Leg Raise 3 seconds  2x10   Straight Leg Raises Limitations no pain today with ex's   Other Supine Lumbar Exercises sitting posture/core focus with 2# ball  side to side                PT Education - 03/20/17 0847    Education provided Yes   Education Details  HEP core activation/posture   Person(s) Educated Patient   Methods Explanation;Demonstration;Handout   Comprehension Verbalized understanding;Returned demonstration          PT Short Term Goals - 03/06/17 1152      PT SHORT TERM GOAL #1   Title STG's=LTG's.           PT Long Term Goals - 03/20/17 8676      PT LONG TERM GOAL #1   Title Independent with a HEP.   Time 8   Period Weeks   Status Achieved     PT LONG TERM GOAL #2   Title Perform ADL's with pain not > 3/10.   Time 8   Period Weeks   Status On-going     PT LONG TERM GOAL #3   Title Eliminate left LE pain.   Time 8   Period Weeks   Status Achieved               Plan - 03/20/17 1950    Clinical Impression Statement Patient tolerated treatment very well today. Patient has reported no more pain in left LE and only minimal soreness in low back. Patient able to progress exercises and no pain with SLR today. Patient given HEP and green t-band. Patient able to meet LTG#1 and #3 today. Remaining goal ongoing due to minimal soreness with ADL's.    Rehab Potential Good   PT Frequency 2x / week   PT Duration 8 weeks   PT Treatment/Interventions ADLs/Self Care Home Management;Moist Heat;Functional mobility training;Therapeutic activities;Therapeutic exercise;Patient/family education;Manual techniques   PT Next Visit Plan PACEMAKER.  cont with progression into core exercises.  may try Positional  traction if needed   Consulted and Agree with Plan of Care Patient      Patient will benefit from skilled therapeutic intervention in order to improve the following deficits and impairments:  Decreased activity tolerance, Decreased range of motion, Postural dysfunction, Pain  Visit Diagnosis: Acute left-sided low back pain, with sciatica presence unspecified  Abnormal posture     Problem List Patient Active Problem List   Diagnosis Date Noted  . Iron deficiency anemia 05/25/2016  . OSA (obstructive sleep apnea) 01/04/2016  . Heme positive stool   . Atrial fibrillation with RVR ([place]) 03/21/2015  . Chronic systolic CHF (congestive heart failure) ([place]) 03/08/2015  . Hx of adenomatous colonic polyps 02/10/2015  . Pancreatic cyst 02/10/2015  . Loose stools 02/10/2015  . Encounter for long-term (current) use of antiplatelets/antithrombotics 01/10/2015  . Hypotension 10/04/2014  . Nonischemic cardiomyopathy ([place]) 10/04/2014  . Hyperglycemia 10/04/2014  . Troponin level elevated 10/04/2014  . Sleep-disordered breathing 10/04/2014  . Obesity 10/04/2014  . Anemia, mild 10/04/2014  . Thrombocytopenia ([place]) 10/04/2014  . Atrial fibrillation with rapid ventricular response ([place]) 10/02/2014  . Sinus bradycardia 10/02/2014  . Acute on chronic systolic CHF (congestive heart failure) ([place]) 10/02/2014  . Skin cancer 07/21/2014  . cardiomyopathy 01/24/2013  . Atrial fibrillation ([place]) 01/22/2013  . Arthritis   . Hyperlipidemia   . Hypertension   . Infected sebaceous cyst 05/15/2012    Chad Brown, PTA 03/20/2017, 8:58 AM  [facility] [place], [place], [address] Phone: [phone]   Fax:  [phone]  Name: THOREN HOSANG MRN: [id number] Date of Birth: [birth year]

## 2017-03-20 NOTE — Patient Instructions (Addendum)
   Scapular Retraction: Bilateral   Facing anchor, pull arms back, bringing shoulder blades together. Repeat _30___ times per set. Do __1-2__ sets per session. Do _1-2___ sessions per day.  Pelvic Tilt: Posterior - Legs Bent (Supine)  Tighten stomach and flatten back by rolling pelvis down. Hold _10___ seconds. Relax. Repeat _10-30___ times per set. Do __2__ sets per session. Do _2___ sessions per day.   Bent Leg Lift (Hook-Lying)  Tighten stomach and slowly raise right leg _5___ inches from floor. Keep trunk rigid. Hold _3___ seconds. Repeat _10___ times per set. Do ___2-3_ sets per session. Do __2__ sessions per day.   Straight Leg Raise  Tighten stomach and slowly raise locked right leg __4__ inches from floor. Repeat __10-30__ times per set. Do __2__ sets per session. Do __2__ sessions per day.  Bridging  Slowly raise buttocks from floor, keeping stomach tight. Repeat _10___ times per set. Do __2__ sets per session. Do __2__ sessions per day.

## 2017-03-21 ENCOUNTER — Encounter: Payer: Self-pay | Admitting: Cardiovascular Disease

## 2017-03-21 NOTE — Progress Notes (Signed)
EPIC Encounter for ICM Monitoring  Patient Name: Chad Brown is a 76 y.o. male Date: 03/21/2017 Primary Care Physican: Dione Housekeeper, MD Primary Cardiologist:Nishan/McLean Electrophysiologist: Allred Dry Weight:220lbs Bi-V Pacing:   98%       Heart Failure questions reviewed, pt asymptomatic.   Thoracic impedance normal.  Prescribed dosage: Furosemide 20 mg 2 tablets (40 mg total) daily. Potassium 20 mEq 1 tablet twice a day  Labs: 01/24/2017 Creatinine 1.46, BUN 13, Potassium 4.1, Sodium 140, EGFR 45-52 11/21/2016 Creatinine 1.40, BUN 15, Potassium 4.0, Sodium 138, EGFR 48-55 10/25/2017Creatinine 1.29, BUN 31, Potassium 4.2, Sodium 136, EGFR 53->60 05/21/2016 Creatinine 1.24, BUN 16, Potassium 4.2, Sodium 138, EGFR 55->60  03/19/2016 Creatinine 1.09, BUN 15, Potassium 4.6, Sodium 137, EGFR >60  01/19/2016 Creatinine 1.15, BUN 10, Potassium 4.2, Sodium 143, EGFR >60  11/04/2015 Creatinine 1.23, BUN 14, Potassium 4.0, Sodium 142, EGFR 56->60   Recommendations: No changes.  Encouraged to call for fluid symptoms.  Follow-up plan: ICM clinic phone appointment on 04/23/2017.  Office appointment scheduled on 04/08/2017 with Dr Johnsie Cancel.  Copy of ICM check sent to device physician.   3 month ICM trend: 03/19/2017   1 Year ICM trend:      Rosalene Billings, RN 03/21/2017 8:22 AM

## 2017-03-22 ENCOUNTER — Ambulatory Visit: Payer: Medicare Other | Admitting: *Deleted

## 2017-03-22 DIAGNOSIS — R293 Abnormal posture: Secondary | ICD-10-CM

## 2017-03-22 DIAGNOSIS — M545 Low back pain: Secondary | ICD-10-CM

## 2017-03-22 NOTE — Therapy (Signed)
[facility] [place], [place], [address] Phone: [phone]   Fax:  [phone]  Physical Therapy Treatment  Patient Details  Name: Chad Brown MRN: [id number] Date of Birth: [birth year] Referring Provider: Elsie Saas MD.  Encounter Date: 03/22/2017      PT End of Session - 03/22/17 0857    Visit Number 6   Number of Visits 16   Date for PT Re-Evaluation 05/05/17   PT Start Time 0812   PT Stop Time 0901   PT Time Calculation (min) 49 min      Past Medical History:  Diagnosis Date  . Adenomatous colon polyp    tubular  . AICD (automatic cardioverter/defibrillator) present 03/08/2015   MDT CRTD   . Anemia    iron deficient  . Arthritis    "about all my joints; hands, knees, back" (03/08/2015)  . Atherosclerosis   . Cataract    left eye small  . Cholelithiasis    gallstones  . Chronic systolic CHF (congestive heart failure) ([place])    a. New dx 12/2012 ? NICM, may be r/t afib. b. Nuc 03/2013 - normal;  c. 03/2015 TEE EF 15-20%.  . Colon polyp, hyperplastic 5/16   removed precancerous lesions  . Depression   . Diverticulosis   . Glaucoma    right eye  . Hyperlipidemia   . Hypertension   . Melanoma of eye ([place]) 2000's   "right; it's never been biopsied"  . Melanoma of lower leg ([place]) 2015   "left; right at my knee"  . Myocardial infarction ([place]) 1998  . OSA (obstructive sleep apnea) 01/04/2016   uses cpap, pt does not know settings auto set  . Peripheral vision loss, right 2006  . Persistent atrial fibrillation ([place])    a. Dx 12/2012, s/p TEE/DCCV 01/26/13. b. On Xarelto (CHA2DS2VASc = 3);  c. 03/2015 TEE (EF 15-20%, no LAA thrombus) and DCCV - amio increased to 200 mg bid.  . Urinary hesitancy due to benign prostatic hypertrophy     Past Surgical History:  Procedure Laterality Date  . BACK SURGERY     upper back, cannot turn neck well  . CARDIAC CATHETERIZATION  1998  . CARDIOVERSION N/A 01/26/2013   Procedure: CARDIOVERSION;  Surgeon: Lelon Perla, MD;  Location: [facility] ENDOSCOPY;  Service: Cardiovascular;  Laterality: N/A;  . CARDIOVERSION N/A 03/23/2015   Procedure: CARDIOVERSION;  Surgeon: Jerline Pain, MD;  Location: [facility];  Service: Cardiovascular;  Laterality: N/A;  . CATARACT EXTRACTION Right ~ 2006  . COLONOSCOPY WITH PROPOFOL N/A 02/10/2015   Procedure: COLONOSCOPY WITH PROPOFOL;  Surgeon: Gatha Mayer, MD;  Location: [facility] ENDOSCOPY;  Service: Endoscopy;  Laterality: N/A;  . COLONOSCOPY WITH PROPOFOL N/A 08/07/2016   Procedure: COLONOSCOPY WITH PROPOFOL;  Surgeon: Gatha Mayer, MD;  Location: [facility] ENDOSCOPY;  Service: Endoscopy;  Laterality: N/A;  . ENTEROSCOPY N/A 08/17/2015   Procedure: ENTEROSCOPY;  Surgeon: Gatha Mayer, MD;  Location: [facility] ENDOSCOPY;  Service: Endoscopy;  Laterality: N/A;  . EP IMPLANTABLE DEVICE N/A 03/08/2015   MDT Hillery Aldo CRT-D for nonischemic CM by Dr Rayann Heman for primary prevention  . GLAUCOMA SURGERY Right ~ 2006   "put 3 stents in to drain fluid" (03/08/2015) not successful, [facility] get [facility] stent out  . HOT HEMOSTASIS N/A 08/07/2016   Procedure: HOT HEMOSTASIS (ARGON PLASMA COAGULATION/BICAP);  Surgeon: Gatha Mayer, MD;  Location: [facility] ENDOSCOPY;  Service: Endoscopy;  Laterality: N/A;  .  INCISION AND DRAINAGE ABSCESS POSTERIOR CERVICALSPINE  05/2012  . JOINT REPLACEMENT    . MELANOMA EXCISION Left 2015   "lower leg; right at my knee"  . REFRACTIVE SURGERY Right ~ 2006 X 2   "twice; both done at [place]" (03/08/2015  . SURGERY SCROTAL / TESTICULAR Right 1990's  . TEE WITHOUT CARDIOVERSION N/A 01/26/2013   Procedure: TRANSESOPHAGEAL ECHOCARDIOGRAM (TEE);  Surgeon: Lelon Perla, MD;  Location: [facility];  Service: Cardiovascular;  Laterality: N/A;  [name] anes. /   . TEE WITHOUT CARDIOVERSION N/A 10/05/2014   Procedure: TRANSESOPHAGEAL ECHOCARDIOGRAM (TEE)  with cardioversion;  Surgeon: Thayer Headings, MD;  Location: [facility] ENDOSCOPY;   Service: Cardiovascular;  Laterality: N/A;  12:52 synched cardioversion at 120 joules,...afib to SR...12 lead EKG ordered.Marland KitchenMarland KitchenCardiozem d/c'ed per MD verbal order at SR  . TEE WITHOUT CARDIOVERSION N/A 03/23/2015   Procedure: TRANSESOPHAGEAL ECHOCARDIOGRAM (TEE);  Surgeon: Jerline Pain, MD;  Location: [facility] ENDOSCOPY;  Service: Cardiovascular;  Laterality: N/A;  . THORACIC SPINE SURGERY  03/2000   "ground calcium deposits from upper thoracic" (01/26/2013)  . TOTAL HIP ARTHROPLASTY Right 06/2007    There were no vitals filed for this visit.      Subjective Assessment - 03/22/17 0808    Subjective Patient reported pain in left LE is gone and is only in back now   Pertinent History PACEMAKER.   How long can you sit comfortably? 5-10 minutes.   How long can you stand comfortably? 5 minutes.   How long can you walk comfortably? Short distances.   Patient Stated Goals Get out of pain.   Currently in Pain? Yes   Pain Score 2    Pain Location Back   Pain Orientation Left   Pain Descriptors / Indicators Sore   Pain Onset More than a month ago   Pain Frequency Intermittent                         [facility] Adult PT Treatment/Exercise - 03/22/17 0001      Lumbar Exercises: Aerobic   Stationary Bike Nustep x 15 mins L3-5 with focus on posture     Lumbar Exercises: Standing   Scapular Retraction Strengthening;Both;Theraband  4x10   Theraband Level (Scapular Retraction) Other (comment)  pink XTS   Shoulder Extension Strengthening;Both;Theraband;20 reps;10 reps  4x10   Theraband Level (Shoulder Extension) Other (comment)  pink XTS   Other Standing Lumbar Exercises Postural stretch with back against Ball on  wall. pec stretch x 10 hold 10 secs and scap retractions x 10 hold 10 secs   Other Standing Lumbar Exercises EIS x 10 for LB extension and  hip flexor stretch, one step lunge with Bil UE flexion x 10     Lumbar Exercises: Supine   Ab Set 20 reps;3 seconds   Glut Set 3  seconds;20 reps   Bent Knee Raise 3 seconds;20 reps   Bridge 10 reps;2 seconds   Straight Leg Raise 3 seconds  2x10   Other Supine Lumbar Exercises 4# ball x 20 over head and then to each knee   Other Supine Lumbar Exercises sitting posture/core focus with 2# ball  side to side                  PT Short Term Goals - 03/06/17 1152      PT SHORT TERM GOAL #1   Title STG's=LTG's.           PT Long Term Goals -  03/20/17 0851      PT LONG TERM GOAL #1   Title Independent with a HEP.   Time 8   Period Weeks   Status Achieved     PT LONG TERM GOAL #2   Title Perform ADL's with pain not > 3/10.   Time 8   Period Weeks   Status On-going     PT LONG TERM GOAL #3   Title Eliminate left LE pain.   Time 8   Period Weeks   Status Achieved             Patient will benefit from skilled therapeutic intervention in order to improve the following deficits and impairments:     Visit Diagnosis: Acute left-sided low back pain, with sciatica presence unspecified  Abnormal posture     Problem List Patient Active Problem List   Diagnosis Date Noted  . Iron deficiency anemia 05/25/2016  . OSA (obstructive sleep apnea) 01/04/2016  . Heme positive stool   . Atrial fibrillation with RVR ([place]) 03/21/2015  . Chronic systolic CHF (congestive heart failure) ([place]) 03/08/2015  . Hx of adenomatous colonic polyps 02/10/2015  . Pancreatic cyst 02/10/2015  . Loose stools 02/10/2015  . Encounter for long-term (current) use of antiplatelets/antithrombotics 01/10/2015  . Hypotension 10/04/2014  . Nonischemic cardiomyopathy ([place]) 10/04/2014  . Hyperglycemia 10/04/2014  . Troponin level elevated 10/04/2014  . Sleep-disordered breathing 10/04/2014  . Obesity 10/04/2014  . Anemia, mild 10/04/2014  . Thrombocytopenia ([place]) 10/04/2014  . Atrial fibrillation with rapid ventricular response ([place]) 10/02/2014  . Sinus bradycardia 10/02/2014  . Acute on chronic systolic CHF  (congestive heart failure) ([place]) 10/02/2014  . Skin cancer 07/21/2014  . cardiomyopathy 01/24/2013  . Atrial fibrillation ([place]) 01/22/2013  . Arthritis   . Hyperlipidemia   . Hypertension   . Infected sebaceous cyst 05/15/2012    Chaundra Abreu,CHRIS, PTA 03/22/2017, 9:03 AM  [facility] [place], [place], [address] Phone: [phone]   Fax:  [phone]  Name: Chad Brown MRN: [id number] Date of Birth: 1941-02-05

## 2017-03-25 ENCOUNTER — Ambulatory Visit: Payer: Medicare Other | Admitting: *Deleted

## 2017-03-25 DIAGNOSIS — M545 Low back pain: Secondary | ICD-10-CM

## 2017-03-25 DIAGNOSIS — R293 Abnormal posture: Secondary | ICD-10-CM

## 2017-03-25 NOTE — Therapy (Signed)
[facility] [place], [place], [address] Phone: [phone]   Fax:  [phone]  Physical Therapy Treatment  Patient Details  Name: Chad Brown MRN: [id number] Date of Birth: 12-18-1940 Referring Provider: Elsie Saas MD.  Encounter Date: 03/25/2017      PT End of Session - 03/25/17 0820    Visit Number 7   Number of Visits 16   Date for PT Re-Evaluation 05/05/17   PT Start Time 0815   PT Stop Time 0901   PT Time Calculation (min) 46 min      Past Medical History:  Diagnosis Date  . Adenomatous colon polyp    tubular  . AICD (automatic cardioverter/defibrillator) present 03/08/2015   MDT CRTD   . Anemia    iron deficient  . Arthritis    "about all my joints; hands, knees, back" (03/08/2015)  . Atherosclerosis   . Cataract    left eye small  . Cholelithiasis    gallstones  . Chronic systolic CHF (congestive heart failure) ([place])    a. New dx 12/2012 ? NICM, may be r/t afib. b. Nuc 03/2013 - normal;  c. 03/2015 TEE EF 15-20%.  . Colon polyp, hyperplastic 5/16   removed precancerous lesions  . Depression   . Diverticulosis   . Glaucoma    right eye  . Hyperlipidemia   . Hypertension   . Melanoma of eye ([place]) 2000's   "right; it's never been biopsied"  . Melanoma of lower leg ([place]) 2015   "left; right at my knee"  . Myocardial infarction ([place]) 1998  . OSA (obstructive sleep apnea) 01/04/2016   uses cpap, pt does not know settings auto set  . Peripheral vision loss, right 2006  . Persistent atrial fibrillation ([place])    a. Dx 12/2012, s/p TEE/DCCV 01/26/13. b. On Xarelto (CHA2DS2VASc = 3);  c. 03/2015 TEE (EF 15-20%, no LAA thrombus) and DCCV - amio increased to 200 mg bid.  . Urinary hesitancy due to benign prostatic hypertrophy     Past Surgical History:  Procedure Laterality Date  . BACK SURGERY     upper back, cannot turn neck well  . CARDIAC CATHETERIZATION  1998  . CARDIOVERSION N/A 01/26/2013   Procedure: CARDIOVERSION;  Surgeon: Lelon Perla, MD;  Location: [facility] ENDOSCOPY;  Service: Cardiovascular;  Laterality: N/A;  . CARDIOVERSION N/A 03/23/2015   Procedure: CARDIOVERSION;  Surgeon: Jerline Pain, MD;  Location: [place];  Service: Cardiovascular;  Laterality: N/A;  . CATARACT EXTRACTION Right ~ 2006  . COLONOSCOPY WITH PROPOFOL N/A 02/10/2015   Procedure: COLONOSCOPY WITH PROPOFOL;  Surgeon: Gatha Mayer, MD;  Location: [facility] ENDOSCOPY;  Service: Endoscopy;  Laterality: N/A;  . COLONOSCOPY WITH PROPOFOL N/A 08/07/2016   Procedure: COLONOSCOPY WITH PROPOFOL;  Surgeon: Gatha Mayer, MD;  Location: [facility] ENDOSCOPY;  Service: Endoscopy;  Laterality: N/A;  . ENTEROSCOPY N/A 08/17/2015   Procedure: ENTEROSCOPY;  Surgeon: Gatha Mayer, MD;  Location: [facility] ENDOSCOPY;  Service: Endoscopy;  Laterality: N/A;  . EP IMPLANTABLE DEVICE N/A 03/08/2015   MDT Hillery Aldo CRT-D for nonischemic CM by Dr Rayann Heman for primary prevention  . GLAUCOMA SURGERY Right ~ 2006   "put 3 stents in to drain fluid" (03/08/2015) not successful, [facility] get [facility] stent out  . HOT HEMOSTASIS N/A 08/07/2016   Procedure: HOT HEMOSTASIS (ARGON PLASMA COAGULATION/BICAP);  Surgeon: Gatha Mayer, MD;  Location: [facility] ENDOSCOPY;  Service: Endoscopy;  Laterality: N/A;  .  INCISION AND DRAINAGE ABSCESS POSTERIOR CERVICALSPINE  05/2012  . JOINT REPLACEMENT    . MELANOMA EXCISION Left 2015   "lower leg; right at my knee"  . REFRACTIVE SURGERY Right ~ 2006 X 2   "twice; both done at [place]" (03/08/2015  . SURGERY SCROTAL / TESTICULAR Right 1990's  . TEE WITHOUT CARDIOVERSION N/A 01/26/2013   Procedure: TRANSESOPHAGEAL ECHOCARDIOGRAM (TEE);  Surgeon: Lelon Perla, MD;  Location: [facility];  Service: Cardiovascular;  Laterality: N/A;  [name] anes. /   . TEE WITHOUT CARDIOVERSION N/A 10/05/2014   Procedure: TRANSESOPHAGEAL ECHOCARDIOGRAM (TEE)  with cardioversion;  Surgeon: Thayer Headings, MD;  Location: [facility] ENDOSCOPY;   Service: Cardiovascular;  Laterality: N/A;  12:52 synched cardioversion at 120 joules,...afib to SR...12 lead EKG ordered.Marland KitchenMarland KitchenCardiozem d/c'ed per MD verbal order at SR  . TEE WITHOUT CARDIOVERSION N/A 03/23/2015   Procedure: TRANSESOPHAGEAL ECHOCARDIOGRAM (TEE);  Surgeon: Jerline Pain, MD;  Location: [facility] ENDOSCOPY;  Service: Cardiovascular;  Laterality: N/A;  . THORACIC SPINE SURGERY  03/2000   "ground calcium deposits from upper thoracic" (01/26/2013)  . TOTAL HIP ARTHROPLASTY Right 06/2007    There were no vitals filed for this visit.      Subjective Assessment - 03/25/17 0814    Subjective Patient reported pain in left LE is gone and is only in back now   Pertinent History PACEMAKER.   How long can you sit comfortably? 5-10 minutes.   How long can you stand comfortably? 5 minutes.   How long can you walk comfortably? Short distances.   Currently in Pain? Yes   Pain Score 2    Pain Location Back   Pain Orientation Left   Pain Descriptors / Indicators Sore   Pain Onset More than a month ago   Pain Frequency Intermittent                         [facility] Adult PT Treatment/Exercise - 03/25/17 0001      Lumbar Exercises: Aerobic   Stationary Bike Nustep x 15 mins L3-5 with focus on posture     Lumbar Exercises: Standing   Scapular Retraction Strengthening;Both;Theraband  4x10   Theraband Level (Scapular Retraction) Other (comment)  pink XTS   Shoulder Extension Strengthening;Both;Theraband  4x10   Theraband Level (Shoulder Extension) Other (comment)  pink XTS   Other Standing Lumbar Exercises Postural stretch with back against Ball on  wall. pec stretch x 10 hold 10 secs and scap retractions x 10 hold 10 secs   Other Standing Lumbar Exercises EIS x 10 for LB extension and  hip flexor stretch, one step lunge with Bil UE flexion x 10     Lumbar Exercises: Supine   Ab Set 20 reps;3 seconds   Glut Set 3 seconds;20 reps   Bent Knee Raise 3 seconds;20 reps   Bridge 10  reps;2 seconds   Straight Leg Raise 3 seconds  2x10   Other Supine Lumbar Exercises 4# ball x 20 over head and then to each knee   Other Supine Lumbar Exercises sitting posture/core focus with 2# ball  side to side                  PT Short Term Goals - 03/06/17 1152      PT SHORT TERM GOAL #1   Title STG's=LTG's.           PT Long Term Goals - 03/20/17 0851      PT LONG TERM  GOAL #1   Title Independent with a HEP.   Time 8   Period Weeks   Status Achieved     PT LONG TERM GOAL #2   Title Perform ADL's with pain not > 3/10.   Time 8   Period Weeks   Status On-going     PT LONG TERM GOAL #3   Title Eliminate left LE pain.   Time 8   Period Weeks   Status Achieved               Plan - 03/25/17 0845    Clinical Impression Statement Pt arrived to clinic today doing fairly well with mainly soreness in LB and no LE pain. He was able to complete all posture and core act.'s with mainly . LTG for ADLs is ongoing at this time due to pain.   Clinical Presentation Stable   Clinical Presentation due to: fatigue.    Clinical Decision Making Low   Rehab Potential Good   PT Frequency 2x / week   PT Duration 8 weeks   PT Treatment/Interventions ADLs/Self Care Home Management;Moist Heat;Functional mobility training;Therapeutic activities;Therapeutic exercise;Patient/family education;Manual techniques   PT Next Visit Plan PACEMAKER.  cont with progression into core exercises.  may try Positional traction if needed   Consulted and Agree with Plan of Care Patient      Patient will benefit from skilled therapeutic intervention in order to improve the following deficits and impairments:  Decreased activity tolerance, Decreased range of motion, Postural dysfunction, Pain  Visit Diagnosis: Acute left-sided low back pain, with sciatica presence unspecified  Abnormal posture     Problem List Patient Active Problem List   Diagnosis Date Noted  . Iron deficiency  anemia 05/25/2016  . OSA (obstructive sleep apnea) 01/04/2016  . Heme positive stool   . Atrial fibrillation with RVR ([place]) 03/21/2015  . Chronic systolic CHF (congestive heart failure) ([place]) 03/08/2015  . Hx of adenomatous colonic polyps 02/10/2015  . Pancreatic cyst 02/10/2015  . Loose stools 02/10/2015  . Encounter for long-term (current) use of antiplatelets/antithrombotics 01/10/2015  . Hypotension 10/04/2014  . Nonischemic cardiomyopathy ([place]) 10/04/2014  . Hyperglycemia 10/04/2014  . Troponin level elevated 10/04/2014  . Sleep-disordered breathing 10/04/2014  . Obesity 10/04/2014  . Anemia, mild 10/04/2014  . Thrombocytopenia ([place]) 10/04/2014  . Atrial fibrillation with rapid ventricular response ([place]) 10/02/2014  . Sinus bradycardia 10/02/2014  . Acute on chronic systolic CHF (congestive heart failure) ([place]) 10/02/2014  . Skin cancer 07/21/2014  . cardiomyopathy 01/24/2013  . Atrial fibrillation ([place]) 01/22/2013  . Arthritis   . Hyperlipidemia   . Hypertension   . Infected sebaceous cyst 05/15/2012    Airianna Kreischer,CHRIS, PTA 03/25/2017, 9:01 AM  [facility] [place], [place], [address] Phone: [phone]   Fax:  [phone]  Name: Chad Brown MRN: [id number] Date of Birth: 17-Nov-1940

## 2017-03-27 ENCOUNTER — Ambulatory Visit: Payer: Medicare Other | Admitting: Physical Therapy

## 2017-03-27 ENCOUNTER — Encounter: Payer: Self-pay | Admitting: Physical Therapy

## 2017-03-27 DIAGNOSIS — M545 Low back pain: Secondary | ICD-10-CM | POA: Diagnosis not present

## 2017-03-27 DIAGNOSIS — R293 Abnormal posture: Secondary | ICD-10-CM

## 2017-03-27 NOTE — Therapy (Addendum)
[facility] [place], [place], [address] Phone: [phone]   Fax:  [phone]  Physical Therapy Treatment  Patient Details  Name: Chad Brown MRN: [id number] Date of Birth: 03-02-1941 Referring Provider: Elsie Saas MD.  Encounter Date: 03/27/2017      PT End of Session - 03/27/17 0838    Visit Number 8   Number of Visits 16   Date for PT Re-Evaluation 05/05/17   PT Start Time 0814   PT Stop Time 0857   PT Time Calculation (min) 43 min   Activity Tolerance Patient tolerated treatment well   Behavior During Therapy [facility] for tasks assessed/performed      Past Medical History:  Diagnosis Date  . Adenomatous colon polyp    tubular  . AICD (automatic cardioverter/defibrillator) present 03/08/2015   MDT CRTD   . Anemia    iron deficient  . Arthritis    "about all my joints; hands, knees, back" (03/08/2015)  . Atherosclerosis   . Cataract    left eye small  . Cholelithiasis    gallstones  . Chronic systolic CHF (congestive heart failure) ([place])    a. New dx 12/2012 ? NICM, may be r/t afib. b. Nuc 03/2013 - normal;  c. 03/2015 TEE EF 15-20%.  . Colon polyp, hyperplastic 5/16   removed precancerous lesions  . Depression   . Diverticulosis   . Glaucoma    right eye  . Hyperlipidemia   . Hypertension   . Melanoma of eye ([place]) 2000's   "right; it's never been biopsied"  . Melanoma of lower leg ([place]) 2015   "left; right at my knee"  . Myocardial infarction ([place]) 1998  . OSA (obstructive sleep apnea) 01/04/2016   uses cpap, pt does not know settings auto set  . Peripheral vision loss, right 2006  . Persistent atrial fibrillation ([place])    a. Dx 12/2012, s/p TEE/DCCV 01/26/13. b. On Xarelto (CHA2DS2VASc = 3);  c. 03/2015 TEE (EF 15-20%, no LAA thrombus) and DCCV - amio increased to 200 mg bid.  . Urinary hesitancy due to benign prostatic hypertrophy     Past Surgical History:  Procedure Laterality Date  . BACK  SURGERY     upper back, cannot turn neck well  . CARDIAC CATHETERIZATION  1998  . CARDIOVERSION N/A 01/26/2013   Procedure: CARDIOVERSION;  Surgeon: Lelon Perla, MD;  Location: [facility] ENDOSCOPY;  Service: Cardiovascular;  Laterality: N/A;  . CARDIOVERSION N/A 03/23/2015   Procedure: CARDIOVERSION;  Surgeon: Jerline Pain, MD;  Location: [place];  Service: Cardiovascular;  Laterality: N/A;  . CATARACT EXTRACTION Right ~ 2006  . COLONOSCOPY WITH PROPOFOL N/A 02/10/2015   Procedure: COLONOSCOPY WITH PROPOFOL;  Surgeon: Gatha Mayer, MD;  Location: [facility] ENDOSCOPY;  Service: Endoscopy;  Laterality: N/A;  . COLONOSCOPY WITH PROPOFOL N/A 08/07/2016   Procedure: COLONOSCOPY WITH PROPOFOL;  Surgeon: Gatha Mayer, MD;  Location: [facility] ENDOSCOPY;  Service: Endoscopy;  Laterality: N/A;  . ENTEROSCOPY N/A 08/17/2015   Procedure: ENTEROSCOPY;  Surgeon: Gatha Mayer, MD;  Location: [facility] ENDOSCOPY;  Service: Endoscopy;  Laterality: N/A;  . EP IMPLANTABLE DEVICE N/A 03/08/2015   MDT Hillery Aldo CRT-D for nonischemic CM by Dr Rayann Heman for primary prevention  . GLAUCOMA SURGERY Right ~ 2006   "put 3 stents in to drain fluid" (03/08/2015) not successful, sent to [facility] to try to get last stent out  . HOT HEMOSTASIS N/A 08/07/2016   Procedure: HOT HEMOSTASIS (ARGON PLASMA  COAGULATION/BICAP);  Surgeon: Gatha Mayer, MD;  Location: [facility] ENDOSCOPY;  Service: Endoscopy;  Laterality: N/A;  . INCISION AND DRAINAGE ABSCESS POSTERIOR CERVICALSPINE  05/2012  . JOINT REPLACEMENT    . MELANOMA EXCISION Left 2015   "lower leg; right at my knee"  . REFRACTIVE SURGERY Right ~ 2006 X 2   "twice; both done at [facility]" (03/08/2015  . SURGERY SCROTAL / TESTICULAR Right 1990's  . TEE WITHOUT CARDIOVERSION N/A 01/26/2013   Procedure: TRANSESOPHAGEAL ECHOCARDIOGRAM (TEE);  Surgeon: Lelon Perla, MD;  Location: [facility];  Service: Cardiovascular;  Laterality: N/A;  [name] anes. /   . TEE WITHOUT CARDIOVERSION N/A 10/05/2014   Procedure:  TRANSESOPHAGEAL ECHOCARDIOGRAM (TEE)  with cardioversion;  Surgeon: Thayer Headings, MD;  Location: [facility] ENDOSCOPY;  Service: Cardiovascular;  Laterality: N/A;  12:52 synched cardioversion at 120 joules,...afib to SR...12 lead EKG ordered.Marland KitchenMarland KitchenCardiozem d/c'ed per MD verbal order at SR  . TEE WITHOUT CARDIOVERSION N/A 03/23/2015   Procedure: TRANSESOPHAGEAL ECHOCARDIOGRAM (TEE);  Surgeon: Jerline Pain, MD;  Location[facility] ENDOSCOPY;  Service: Cardiovascular;  Laterality: N/A;  . THORACIC SPINE SURGERY  03/2000   "ground calcium deposits from upper thoracic" (01/26/2013)  . TOTAL HIP ARTHROPLASTY Right 06/2007    There were no vitals filed for this visit.      Subjective Assessment - 03/27/17 0818    Subjective Patient reported ongoing improvement and minimal discomfort   Pertinent History PACEMAKER.   How long can you sit comfortably? 5-10 minutes.   How long can you stand comfortably? 5 minutes.   How long can you walk comfortably? Short distances.   Patient Stated Goals Get out of pain.   Currently in Pain? Yes   Pain Score 2    Pain Location Back   Pain Orientation Left   Pain Descriptors / Indicators Sore   Pain Type Acute pain   Pain Onset More than a month ago   Pain Frequency Intermittent   Aggravating Factors  prolong position   Pain Relieving Factors at rest                         [facility] Adult PT Treatment/Exercise - 03/27/17 0001      Lumbar Exercises: Aerobic   Stationary Bike Nustep x 15 mins L3-5 with focus on posture     Lumbar Exercises: Standing   Scapular Retraction Strengthening;Both;Theraband  4x10   Theraband Level (Scapular Retraction) Other (comment)  pink XTS   Shoulder Extension Strengthening;Both;Theraband  4x10   Theraband Level (Shoulder Extension) Other (comment)  pink XTS   Other Standing Lumbar Exercises Postural stretch with back against Ball on  wall. pec stretch x 10 hold 10 secs and scap retractions x 10 hold 10 secs   Other  Standing Lumbar Exercises EIS x 10 for LB extension and  hip flexor stretch, one step lunge with Bil UE flexion x 10     Lumbar Exercises: Supine   Ab Set 20 reps;3 seconds   Glut Set 3 seconds;20 reps   Bent Knee Raise 3 seconds;20 reps   Bridge 10 reps;2 seconds   Straight Leg Raise 3 seconds  2x10   Other Supine Lumbar Exercises 4# ball x 20 over head and then to each knee   Other Supine Lumbar Exercises sitting posture/core focus with 2# ball  side to side                  PT Short Term Goals -  03/06/17 1152      PT SHORT TERM GOAL #1   Title STG's=LTG's.           PT Long Term Goals - 03/27/17 6808      PT LONG TERM GOAL #1   Title Independent with a HEP.   Time 8   Period Weeks   Status Achieved     PT LONG TERM GOAL #2   Title Perform ADL's with pain not > 3/10.   Time 8   Period Weeks   Status Achieved     PT LONG TERM GOAL #3   Title Eliminate left LE pain.   Time 8   Period Weeks   Status Achieved               Plan - 03/27/17 0839    Clinical Impression Statement Patient has met all current goals and would like to be put on hold. Patient has minimal soreness in low back and able to complete all ADL's with pain no more than 2/10. Patient independent with HEP.   Rehab Potential Good   PT Frequency 2x / week   PT Duration 8 weeks   PT Treatment/Interventions ADLs/Self Care Home Management;Moist Heat;Functional mobility training;Therapeutic activities;Therapeutic exercise;Patient/family education;Manual techniques   PT Next Visit Plan on hold   Consulted and Agree with Plan of Care Patient      Patient will benefit from skilled therapeutic intervention in order to improve the following deficits and impairments:  Decreased activity tolerance, Decreased range of motion, Postural dysfunction, Pain  Visit Diagnosis: Acute left-sided low back pain, with sciatica presence unspecified  Abnormal posture     Problem List Patient  Active Problem List   Diagnosis Date Noted  . Iron deficiency anemia 05/25/2016  . OSA (obstructive sleep apnea) 01/04/2016  . Heme positive stool   . Atrial fibrillation with RVR ([place]) 03/21/2015  . Chronic systolic CHF (congestive heart failure) ([place]) 03/08/2015  . Hx of adenomatous colonic polyps 02/10/2015  . Pancreatic cyst 02/10/2015  . Loose stools 02/10/2015  . Encounter for long-term (current) use of antiplatelets/antithrombotics 01/10/2015  . Hypotension 10/04/2014  . Nonischemic cardiomyopathy ([place]) 10/04/2014  . Hyperglycemia 10/04/2014  . Troponin level elevated 10/04/2014  . Sleep-disordered breathing 10/04/2014  . Obesity 10/04/2014  . Anemia, mild 10/04/2014  . Thrombocytopenia ([place]) 10/04/2014  . Atrial fibrillation with rapid ventricular response ([place]) 10/02/2014  . Sinus bradycardia 10/02/2014  . Acute on chronic systolic CHF (congestive heart failure) ([place]) 10/02/2014  . Skin cancer 07/21/2014  . cardiomyopathy 01/24/2013  . Atrial fibrillation ([place]) 01/22/2013  . Arthritis   . Hyperlipidemia   . Hypertension   . Infected sebaceous cyst 05/15/2012    Phillips Climes, PTA 03/27/2017, 8:58 AM  [facility] [place], [place], [address] Phone: [phone]   Fax:  [phone]  Name: Chad Brown MRN: [id number] Date of Birth: 1941/01/12  PHYSICAL THERAPY DISCHARGE SUMMARY  Visits from Start of Care: 8.  Current functional level related to goals / functional outcomes: See above.   Remaining deficits: Goals met.   Education / Equipment: HEP. Plan: Patient agrees to discharge.  Patient goals were met. Patient is being discharged due to meeting the stated rehab goals.  ?????         Mali Applegate MPT

## 2017-03-29 ENCOUNTER — Other Ambulatory Visit (HOSPITAL_COMMUNITY): Payer: Self-pay | Admitting: Oncology

## 2017-03-29 ENCOUNTER — Encounter: Payer: Medicare Other | Admitting: *Deleted

## 2017-04-01 NOTE — Progress Notes (Signed)
Patient ID: Chad Brown, male   DOB: [birth year], 76 y.o.   MRN: [id number] PCP: [place] Primary Cardiologist: Johnsie Cancel HF Cardiologist: Aundra Dubin  76 y.o.  with paroxysmal atrial fibrillation and chronic systolic CHF thought to be due to nonischemic cardiomyopathy presents f/u.[name] He has a cardiac history dating back to 1998, when he was admitted with chest pain and had cardiac cath showing nonobstructive CAD. In 4/14, he was found to be in atrial fibrillation.  Echo showed EF 30-35%.  He had DCCV back to NSR.  He was back in atrial fibrillation in 1/16, and EF was low again on TEE at that time.   Again, he had DCCV.  In 5/16, cardiac MRI showed persistently low EF, so given LBBB, he had Medtronic CRT-D device placed.  He was back in atrial fibrillation in 6/16 and had TEE-guided DCCV again with EF now 15-20% on TEE.  Repeat echo in 2/17 showed EF 55-60%.   08/07/16  he had colonoscopy with removal of polyps.  He restarted Xarelto the next day.  A few days later, he had profuse BRBPR.  xarelto held and resolved last Hct 09/11/16 30.5    Optivol: fluid index < threshold, impedance stable.  2 hrs/day activity.  1 very brief atrial fibrillation run, otherwise NSR, > 99% BiV paced. Amiodarone dose decreased by Dr Rayann Heman 11/14/16 Sees Dr Radford Pax for OSA   PMH: 1. Atrial fibrillation: Paroxysmal.  Diagnosed 4/14, DCCV 4/14 to NSR.  DCCV 1/16 to NSR.  DCCV 6/16 to NSR.  Will consider stopping amiodarone in a year if no PAF as monitored by CRT 2. Chronic systolic CHF: Nonischemic cardiomyopathy.  LHC in 1998 with nonobstructive disease.  Cardiolite in 6/14 with no ischemia or infarction.  cMRI (5/16) with EF 34%, mildly dilated LV with diffuse HK worse in anterolateral wall, small punctate areas of LGE in anteroseptum and basal inferior wall (not CAD pattern).  TEE (6/16) with EF 15-20%.  He has Medtronic CRT-D device.  Echo (9/16) with EF 40%, moderate LV dilation, grade II diastolic dysfunction, normal RV size and  systolic function, PASP 42 mmHg.  - Hypotension with Entresto.  - Echo (2/17) showed LV functional recovery, EF 55-60% with mild MR.                                                                                                3. Hyperlipidemia: Myalgias with atorvastatin, Livalo, and pravastatin.  4. CKD 5. OA: s/p THR.  6. H/o melanoma 7. Anemia 8. Diverticulosis 9. OSA: Uses CPAP.   SH: Married with 3 children, lives in [place], retired, quit smoking in 2001.    FH: Mother with MI  ROS: All systems reviewed and negative except as per HPI.  Current Outpatient Prescriptions  Medication Sig Dispense Refill  . amiodarone (PACERONE) 200 MG tablet Take 0.5 tablets (100 mg total) by mouth daily. 15 tablet 9  . carvedilol (COREG) 12.5 MG tablet Take 1 tablet (12.5 mg total) by mouth 2 (two) times daily with a meal. 180 tablet 3  . dorzolamide-timolol (COSOPT) 22.3-6.8 MG/ML ophthalmic solution Place 1 drop  into the right eye 2 (two) times daily.    [name] escitalopram (LEXAPRO) 20 MG tablet TAKE 1 TABLET ONCE A DAY 90 tablet 1  . ezetimibe (ZETIA) 10 MG tablet Take 1 tablet (10 mg total) by mouth daily. 90 tablet 3  . finasteride (PROSCAR) 5 MG tablet Take 5 mg by mouth every morning.     . furosemide (LASIX) 20 MG tablet Take 2 tablets (40 mg total) by mouth daily. 180 tablet 3  . gabapentin (NEURONTIN) 100 MG capsule TAKE 1 TAB BY MOUTH IN THE AM AND 2 TABS BY MOUTH IN THE PM    . losartan (COZAAR) 50 MG tablet TAKE 1 TABLET IN THE MORNING AND 1/2 TABLET IN THE EVENING 135 tablet 2  . niacin (NIASPAN) 500 MG CR tablet Take 500 mg by mouth 2 (two) times daily.    [name] Glycol-Propyl Glycol (SYSTANE ULTRA OP) Place 1 drop into the left eye 2 (two) times daily.     . potassium chloride SA (K-DUR,KLOR-CON) 20 MEQ tablet TAKE  (1)  TABLET TWICE A DAY. 180 tablet 1  . rivaroxaban (XARELTO) 20 MG TABS tablet Take 1 tablet (20 mg total) by mouth daily with supper. RESTART 08/11/16 90  tablet 3  . spironolactone (ALDACTONE) 25 MG tablet TAKE (1/2) TABLET DAILY. 45 tablet 3  . tamsulosin (FLOMAX) 0.4 MG CAPS Take 0.4 mg by mouth at bedtime.      No current facility-administered medications for this visit.    BP 108/70   Pulse 64   Ht 5\' 6"  (1.676 m)   Wt 227 lb 12.8 oz (103.3 kg)   SpO2 97%   BMI 36.77 kg/m  Affect appropriate Elderly black male  HEENT: normal Neck supple with no adenopathy JVP normal no bruits no thyromegaly Lungs clear with no wheezing and good diaphragmatic motion Heart:  S1/S2 no murmur, no rub, gallop or click PMI normal AICD under left clavicle  Abdomen: benighn, BS positve, no tenderness, no AAA no bruit.  No HSM or HJR Distal pulses intact with no bruits Trace bilateral  edema Neuro non-focal Skin warm and dry No muscular weakness   Assessment/Plan: 1. Chronic systolic CHF: Suspect nonischemic cardiomyopathy.   2/17 repeat echo showed EF up to 55-60%.  He has Medtronic CRT-D device.  NYHA class II symptoms with good volume status by exam and Optivol.   - Continue current Lasix.  - Continue current losartan and Coreg.  Continue spironolactone 12.5 daily.  2. Atrial fibrillation: Seems to tolerate poorly.  He is in NSR today.  Device interrogation showed only 1 very brief afib run.  - Continue Xarelto 20 daily.  - Continue amiodarone 100 mg daily. LFTls normal 01/24/17 TSH normal 08/14/16  .   3. Hyperlipidemia: LDL remains quite high on Zetia.  He cannot take statins.   4. Post-colonoscopy bleeding: resolved  5. OSA:  CPAP f/u Dr Radford Pax   Followup in 6 months   [name] 04/08/2017 8:51 AM

## 2017-04-05 ENCOUNTER — Encounter: Payer: Self-pay | Admitting: Internal Medicine

## 2017-04-08 ENCOUNTER — Ambulatory Visit (INDEPENDENT_AMBULATORY_CARE_PROVIDER_SITE_OTHER): Payer: Medicare Other | Admitting: Cardiovascular Disease

## 2017-04-08 ENCOUNTER — Encounter: Payer: Self-pay | Admitting: Cardiovascular Disease

## 2017-04-08 VITALS — BP 108/70 | HR 64 | Ht 66.0 in | Wt 227.8 lb

## 2017-04-08 DIAGNOSIS — I480 Paroxysmal atrial fibrillation: Secondary | ICD-10-CM

## 2017-04-08 NOTE — Patient Instructions (Signed)

## 2017-04-23 ENCOUNTER — Telehealth: Payer: Self-pay | Admitting: Cardiology

## 2017-04-23 NOTE — Telephone Encounter (Signed)
Spoke with pt and reminded pt of remote transmission that is due today. Pt verbalized understanding.   

## 2017-04-24 ENCOUNTER — Telehealth: Payer: Self-pay | Admitting: Internal Medicine

## 2017-04-24 NOTE — Telephone Encounter (Signed)
Spoke with patient who is getting a 5007 error code using his land line and home monitor. Will order wire-t as he as home Internet. He verbalized understanding and requested that I make Margarita Grizzle aware.

## 2017-04-24 NOTE — Telephone Encounter (Signed)
New Message     Pt can  Not get his device to transmit

## 2017-04-26 NOTE — Progress Notes (Signed)
No ICM remote transmission received for 04/23/2017 due to monitor not working and next ICM transmission scheduled for 05/15/2017.

## 2017-04-29 ENCOUNTER — Telehealth: Payer: Self-pay

## 2017-04-29 NOTE — Telephone Encounter (Signed)
Returned call to patient as requested by voice mail message.  He received new machine and asked if he should send a remote transmission.  Advised to send a manual to get it connected again and he said he would do that tomorrow.

## 2017-05-15 ENCOUNTER — Ambulatory Visit (INDEPENDENT_AMBULATORY_CARE_PROVIDER_SITE_OTHER): Payer: Medicare Other | Admitting: *Deleted

## 2017-05-15 DIAGNOSIS — I5022 Chronic systolic (congestive) heart failure: Secondary | ICD-10-CM | POA: Diagnosis not present

## 2017-05-15 DIAGNOSIS — I428 Other cardiomyopathies: Secondary | ICD-10-CM

## 2017-05-15 DIAGNOSIS — I480 Paroxysmal atrial fibrillation: Secondary | ICD-10-CM | POA: Diagnosis not present

## 2017-05-15 DIAGNOSIS — Z95810 Presence of automatic (implantable) cardiac defibrillator: Secondary | ICD-10-CM

## 2017-05-15 NOTE — Progress Notes (Signed)
Remote defibrillator check.  

## 2017-05-16 ENCOUNTER — Telehealth: Payer: Self-pay | Admitting: *Deleted

## 2017-05-16 LAB — CUP PACEART REMOTE DEVICE CHECK
Battery Remaining Longevity: 84 mo
Battery Voltage: 2.98 V
Brady Statistic AS VS Percent: 0.09 %
Brady Statistic RA Percent Paced: 98.75 %
HIGH POWER IMPEDANCE MEASURED VALUE: 74 Ohm
Implantable Lead Implant Date: 20160607
Implantable Lead Implant Date: 20160607
Implantable Lead Location: 753858
Implantable Lead Location: 753860
Implantable Lead Model: 4598
Implantable Lead Model: 5076
Implantable Pulse Generator Implant Date: 20160607
Lead Channel Impedance Value: 1026 Ohm
Lead Channel Impedance Value: 380 Ohm
Lead Channel Impedance Value: 437 Ohm
Lead Channel Impedance Value: 456 Ohm
Lead Channel Impedance Value: 646 Ohm
Lead Channel Impedance Value: 779 Ohm
Lead Channel Impedance Value: 893 Ohm
Lead Channel Impedance Value: 950 Ohm
Lead Channel Pacing Threshold Amplitude: 0.75 V
Lead Channel Pacing Threshold Pulse Width: 0.4 ms
Lead Channel Sensing Intrinsic Amplitude: 15.25 mV
Lead Channel Sensing Intrinsic Amplitude: 2.125 mV
Lead Channel Setting Pacing Amplitude: 2.25 V
Lead Channel Setting Pacing Pulse Width: 0.4 ms
MDC IDC LEAD IMPLANT DT: 20160607
MDC IDC LEAD LOCATION: 753859
MDC IDC MSMT LEADCHNL LV IMPEDANCE VALUE: 1083 Ohm
MDC IDC MSMT LEADCHNL LV IMPEDANCE VALUE: 399 Ohm
MDC IDC MSMT LEADCHNL LV IMPEDANCE VALUE: 570 Ohm
MDC IDC MSMT LEADCHNL LV IMPEDANCE VALUE: 589 Ohm
MDC IDC MSMT LEADCHNL LV IMPEDANCE VALUE: 836 Ohm
MDC IDC MSMT LEADCHNL LV PACING THRESHOLD AMPLITUDE: 0.625 V
MDC IDC MSMT LEADCHNL RA PACING THRESHOLD AMPLITUDE: 0.625 V
MDC IDC MSMT LEADCHNL RA PACING THRESHOLD PULSEWIDTH: 0.4 ms
MDC IDC MSMT LEADCHNL RA SENSING INTR AMPL: 2.125 mV
MDC IDC MSMT LEADCHNL RV PACING THRESHOLD PULSEWIDTH: 0.4 ms
MDC IDC MSMT LEADCHNL RV SENSING INTR AMPL: 15.25 mV
MDC IDC SESS DTM: 20180815101806
MDC IDC SET LEADCHNL RA PACING AMPLITUDE: 1.5 V
MDC IDC SET LEADCHNL RV PACING AMPLITUDE: 2 V
MDC IDC SET LEADCHNL RV PACING PULSEWIDTH: 0.4 ms
MDC IDC SET LEADCHNL RV SENSING SENSITIVITY: 0.3 mV
MDC IDC STAT BRADY AP VP PERCENT: 97.17 %
MDC IDC STAT BRADY AP VS PERCENT: 1.64 %
MDC IDC STAT BRADY AS VP PERCENT: 1.1 %
MDC IDC STAT BRADY RV PERCENT PACED: 2.96 %

## 2017-05-16 NOTE — Telephone Encounter (Signed)
Spoke with patient.  Advised that transmission was received on 05/15/17 at [facility].  Patient verbalizes understanding and appreciation.  He denies additional questions or concerns at this time.

## 2017-05-16 NOTE — Progress Notes (Signed)
EPIC Encounter for ICM Monitoring  Patient Name: Chad Brown is a 76 y.o. male Date: 05/16/2017 Primary Care Physican: Dione Housekeeper, MD Primary Cardiologist:Nishan/McLean Electrophysiologist: Allred Dry Weight: 222 lbs  Bi-V Pacing:   98%       Heart Failure questions reviewed, pt asymptomatic.   Thoracic impedance normal.  Prescribed dosage: Furosemide 20 mg 2 tablets (40 mg total) daily. Potassium 20 mEq 1 tablet twice a day  Labs: 01/24/2017 Creatinine 1.46, BUN 13, Potassium 4.1, Sodium 140, EGFR 45-52 11/21/2016 Creatinine 1.40, BUN 15, Potassium 4.0, Sodium 138, EGFR 48-55 10/25/2017Creatinine 1.29, BUN 31, Potassium 4.2, Sodium 136, EGFR 53->60 05/21/2016 Creatinine 1.24, BUN 16, Potassium 4.2, Sodium 138, EGFR 55->60  03/19/2016 Creatinine 1.09, BUN 15, Potassium 4.6, Sodium 137, EGFR >60  01/19/2016 Creatinine 1.15, BUN 10, Potassium 4.2, Sodium 143, EGFR >60  11/04/2015 Creatinine 1.23, BUN 14, Potassium 4.0, Sodium 142, EGFR 56->60   Recommendations: No changes.   Encouraged to call for fluid symptoms.  Follow-up plan: ICM clinic phone appointment on 06/17/2017.  .  Copy of ICM check sent to device physician.   3 month ICM trend: 05/16/2017   1 Year ICM trend:      Rosalene Billings, RN 05/16/2017 2:10 PM

## 2017-05-16 NOTE — Telephone Encounter (Signed)
°  1. Has your device fired?   2. Is you device beeping?   3. Are you experiencing draining or swelling at device site?   4. Are you calling to see if we received your device transmission? yes  5. Have you passed out?

## 2017-05-24 ENCOUNTER — Other Ambulatory Visit: Payer: Self-pay | Admitting: Cardiology

## 2017-05-28 ENCOUNTER — Encounter: Payer: Self-pay | Admitting: Cardiology

## 2017-06-11 ENCOUNTER — Encounter: Payer: Self-pay | Admitting: Cardiology

## 2017-06-17 ENCOUNTER — Telehealth: Payer: Self-pay | Admitting: Cardiology

## 2017-06-17 ENCOUNTER — Ambulatory Visit (INDEPENDENT_AMBULATORY_CARE_PROVIDER_SITE_OTHER): Payer: Medicare Other

## 2017-06-17 DIAGNOSIS — Z95810 Presence of automatic (implantable) cardiac defibrillator: Secondary | ICD-10-CM | POA: Diagnosis not present

## 2017-06-17 DIAGNOSIS — I5022 Chronic systolic (congestive) heart failure: Secondary | ICD-10-CM | POA: Diagnosis not present

## 2017-06-17 NOTE — Progress Notes (Signed)
EPIC Encounter for ICM Monitoring  Patient Name: Chad Brown is a 76 y.o. male Date: 06/17/2017 Primary Care Physican: Dione Housekeeper, MD Primary Cardiologist:Nishan/McLean Electrophysiologist: Allred Dry Weight:   Previous weight 222 lbs  Bi-V Pacing:   98%        Spoke with wife since patient was not home. Heart Failure questions reviewed, pt asymptomatic.   Thoracic impedance normal.  Prescribed dosage: Furosemide 20 mg 2 tablets (40 mg total) daily. Potassium 20 mEq 1 tablet twice a day  Labs: 01/24/2017 Creatinine 1.46, BUN 13, Potassium 4.1, Sodium 140, EGFR 45-52 11/21/2016 Creatinine 1.40, BUN 15, Potassium 4.0, Sodium 138, EGFR 48-55 10/25/2017Creatinine 1.29, BUN 31, Potassium 4.2, Sodium 136, EGFR 53->60 05/21/2016 Creatinine 1.24, BUN 16, Potassium 4.2, Sodium 138, EGFR 55->60  03/19/2016 Creatinine 1.09, BUN 15, Potassium 4.6, Sodium 137, EGFR >60  01/19/2016 Creatinine 1.15, BUN 10, Potassium 4.2, Sodium 143, EGFR >60  11/04/2015 Creatinine 1.23, BUN 14, Potassium 4.0, Sodium 142, EGFR 56->60   Recommendations: No changes.   Encouraged to call for fluid symptoms.  Follow-up plan: ICM clinic phone appointment on 07/18/2017.    Copy of ICM check sent to Dr. Rayann Heman.   3 month ICM trend: 06/17/2017   1 Year ICM trend:      Rosalene Billings, RN 06/17/2017 3:54 PM

## 2017-06-17 NOTE — Telephone Encounter (Signed)
LMOVM reminding pt to send remote transmission.   

## 2017-06-24 ENCOUNTER — Telehealth: Payer: Self-pay | Admitting: *Deleted

## 2017-06-24 NOTE — Telephone Encounter (Signed)
Patient called today to say he thinks he needs a new mask because his mask slides up his face during the night and he feels like that maybe causing some leaks as well. Patient states he got a new mask one year ago. His compliance usage is down at 22/30 (73%) and <=4 hours 22 days (73%). Patient states he does not wear his mask on Saturday nights so he does not mess up his hair for Sunday morning. Patient was encouraged to improve his compliance and to contact his DME to trouble shoot and to call our office back if we need to order a new mask or chin strap.

## 2017-07-05 ENCOUNTER — Other Ambulatory Visit: Payer: Self-pay | Admitting: Cardiovascular Disease

## 2017-07-18 ENCOUNTER — Ambulatory Visit (INDEPENDENT_AMBULATORY_CARE_PROVIDER_SITE_OTHER): Payer: Medicare Other

## 2017-07-18 ENCOUNTER — Telehealth: Payer: Self-pay

## 2017-07-18 DIAGNOSIS — Z95810 Presence of automatic (implantable) cardiac defibrillator: Secondary | ICD-10-CM

## 2017-07-18 DIAGNOSIS — I5022 Chronic systolic (congestive) heart failure: Secondary | ICD-10-CM | POA: Diagnosis not present

## 2017-07-18 NOTE — Progress Notes (Signed)
EPIC Encounter for ICM Monitoring  Patient Name: Chad Brown is a 76 y.o. male Date: 07/18/2017 Primary Care Physican: Dione Housekeeper, MD Primary Cardiologist:Nishan/McLean Electrophysiologist: Allred Dry Weight:Previous weight 222lbs  Bi-V Pacing: 97.5%   Clinical Status (20-Jun-2017 to 15-Jul-2017) Treated VT/VF 0 episodes  AT/AF 23 episodes Time in AT/AF 0.2 hr/day (0.9%) Longest AT/AF 41 minutes       Attempted call to patient and unable to reach.  Left detailed message regarding transmission.  Transmission reviewed.    Thoracic impedance normal.     Prescribed dosage: Furosemide 20 mg 2 tablets (40 mg total) daily. Potassium 20 mEq 1 tablet twice a day  Labs: 01/24/2017 Creatinine 1.46, BUN 13, Potassium 4.1, Sodium 140, EGFR 45-52 11/21/2016 Creatinine 1.40, BUN 15, Potassium 4.0, Sodium 138, EGFR 48-55 10/25/2017Creatinine 1.29, BUN 31, Potassium 4.2, Sodium 136, EGFR 53->60 05/21/2016 Creatinine 1.24, BUN 16, Potassium 4.2, Sodium 138, EGFR 55->60  03/19/2016 Creatinine 1.09, BUN 15, Potassium 4.6, Sodium 137, EGFR >60  01/19/2016 Creatinine 1.15, BUN 10, Potassium 4.2, Sodium 143, EGFR >60  11/04/2015 Creatinine 1.23, BUN 14, Potassium 4.0, Sodium 142, EGFR 56->60   Recommendations: Left voice mail with ICM number and encouraged to call if experiencing any fluid symptoms.  Follow-up plan: ICM clinic phone appointment on 08/19/2017.      Copy of ICM check sent to Dr. Rayann Heman.   AT/AF was 5 hours on this report.  Last report with AT/AF was July 2018.    3 month ICM trend: 07/15/2017        AT/AF     1 Year ICM trend:      Rosalene Billings, RN 07/18/2017 4:35 PM

## 2017-07-18 NOTE — Telephone Encounter (Signed)
Remote ICM transmission received.  Attempted call to patient and left detailed message per DPR regarding transmission and next ICM scheduled for 08/19/2017.  Advised to return call for any fluid symptoms or questions.

## 2017-07-26 ENCOUNTER — Telehealth: Payer: Self-pay

## 2017-07-26 ENCOUNTER — Encounter (HOSPITAL_COMMUNITY): Payer: Medicare Other

## 2017-07-26 ENCOUNTER — Encounter (HOSPITAL_COMMUNITY): Payer: Self-pay | Admitting: Oncology

## 2017-07-26 ENCOUNTER — Encounter (HOSPITAL_COMMUNITY): Payer: Medicare Other | Attending: Oncology | Admitting: Oncology

## 2017-07-26 VITALS — BP 99/69 | HR 89 | Temp 98.0°F | Resp 18 | Wt 230.6 lb

## 2017-07-26 DIAGNOSIS — D649 Anemia, unspecified: Secondary | ICD-10-CM

## 2017-07-26 DIAGNOSIS — K862 Cyst of pancreas: Secondary | ICD-10-CM | POA: Diagnosis not present

## 2017-07-26 DIAGNOSIS — D500 Iron deficiency anemia secondary to blood loss (chronic): Secondary | ICD-10-CM | POA: Insufficient documentation

## 2017-07-26 DIAGNOSIS — D508 Other iron deficiency anemias: Secondary | ICD-10-CM

## 2017-07-26 DIAGNOSIS — Z85820 Personal history of malignant melanoma of skin: Secondary | ICD-10-CM

## 2017-07-26 DIAGNOSIS — D696 Thrombocytopenia, unspecified: Secondary | ICD-10-CM

## 2017-07-26 DIAGNOSIS — N189 Chronic kidney disease, unspecified: Secondary | ICD-10-CM | POA: Diagnosis not present

## 2017-07-26 LAB — CBC WITH DIFFERENTIAL/PLATELET
BASOS ABS: 0 10*3/uL (ref 0.0–0.1)
BASOS PCT: 1 %
EOS PCT: 1 %
Eosinophils Absolute: 0.1 10*3/uL (ref 0.0–0.7)
HCT: 35.6 % — ABNORMAL LOW (ref 39.0–52.0)
Hemoglobin: 11.6 g/dL — ABNORMAL LOW (ref 13.0–17.0)
Lymphocytes Relative: 24 %
Lymphs Abs: 1.1 10*3/uL (ref 0.7–4.0)
MCH: 31.9 pg (ref 26.0–34.0)
MCHC: 32.6 g/dL (ref 30.0–36.0)
MCV: 97.8 fL (ref 78.0–100.0)
MONO ABS: 0.5 10*3/uL (ref 0.1–1.0)
Monocytes Relative: 11 %
NEUTROS ABS: 2.9 10*3/uL (ref 1.7–7.7)
Neutrophils Relative %: 63 %
Platelets: 127 10*3/uL — ABNORMAL LOW (ref 150–400)
RBC: 3.64 MIL/uL — AB (ref 4.22–5.81)
RDW: 14.9 % (ref 11.5–15.5)
WBC: 4.7 10*3/uL (ref 4.0–10.5)

## 2017-07-26 LAB — IRON AND TIBC
IRON: 39 ug/dL — AB (ref 45–182)
SATURATION RATIOS: 15 % — AB (ref 17.9–39.5)
TIBC: 258 ug/dL (ref 250–450)
UIBC: 219 ug/dL

## 2017-07-26 LAB — COMPREHENSIVE METABOLIC PANEL
ALK PHOS: 88 U/L (ref 38–126)
ALT: 17 U/L (ref 17–63)
ANION GAP: 7 (ref 5–15)
AST: 17 U/L (ref 15–41)
Albumin: 3.7 g/dL (ref 3.5–5.0)
BILIRUBIN TOTAL: 0.5 mg/dL (ref 0.3–1.2)
BUN: 11 mg/dL (ref 6–20)
CO2: 29 mmol/L (ref 22–32)
CREATININE: 1.36 mg/dL — AB (ref 0.61–1.24)
Calcium: 8.7 mg/dL — ABNORMAL LOW (ref 8.9–10.3)
Chloride: 103 mmol/L (ref 101–111)
GFR, EST AFRICAN AMERICAN: 57 mL/min — AB (ref >60)
GFR, EST NON AFRICAN AMERICAN: 49 mL/min — AB (ref >60)
Glucose, Bld: 123 mg/dL — ABNORMAL HIGH (ref 65–99)
Potassium: 4 mmol/L (ref 3.5–5.1)
Sodium: 139 mmol/L (ref 135–145)
TOTAL PROTEIN: 6.8 g/dL (ref 6.5–8.1)

## 2017-07-26 LAB — FERRITIN: Ferritin: 186 ng/mL (ref 24–336)

## 2017-07-26 NOTE — Addendum Note (Signed)
Addended by: Donetta Potts on: 07/26/2017 02:54 PM   Modules accepted: Orders

## 2017-07-26 NOTE — Telephone Encounter (Signed)
Patient called to report high heart rate for past 2 weeks.  It has been ranging from 90-112 but most of the time it is >100.  He has been experiencing some dizziness.  Had had some low BP readings in the last 2 weeks.   10/15 - BP 69/51 10/17 - BP 89/63 10/18 - BP 80/55 10/23 - 89/54  Gradual weight increase of 4-6 lbs over the last 5 weeks.  Baseline weight 220-221 lbs and today was 226.4 lbs.  He said Dr Johnsie Cancel said he could take an extra Furosemide when needed and he took extra one yesterday. He is sending a remote transmission today for review.  Advised will have device clinic review the transmission.    Patient will be at an appointment from about 1:00 PM to 3:PM today.

## 2017-07-26 NOTE — Telephone Encounter (Signed)
Remote reviewed. Presenting rhythm: AF/Bp, VSRp. Persistent x ~10days. No ventricular arrhythmias recorded. 64.7%Bp. Stable device function.  Transmission reviewed with Dr.Allred. Dr.Allred recommended that patient f/u with AF clinic on Monday to ?increase Amio to 200mg  and schedule DCCV. Stacey aware.  Spoke to patient about Dr.Allred's recommendations. Patient verbalized understanding. Appt scheduled for 10/29 @ 1415.

## 2017-07-26 NOTE — Progress Notes (Signed)
[facility] NOTE  Patient Care Team: Dione Housekeeper, MD as PCP - General (Family Medicine)  [address] / [place] [address]   CHIEF COMPLAINTS/PURPOSE OF CONSULTATION:  T2a N0 Melanoma of the L Thigh s/p WLE and sentinel node with Dr. Freddi Che Abnormal CT and MRI imaging of the abdomen Anemia Iron deficiency on oral iron and requiring intermittent IV iron replacement  HISTORY OF PRESENTING ILLNESS:  Chad Brown 76 y.o. male returns for follow up of melanoma and anemia.   He is doing well today. He no longer takes oral iron at home. He reports chronic bilateral knee pain. He sometimes feel like "I have a frog in my throat" at times. No dysphagia. It happens mostly at night. Denies palpitations, leg swelling, blood in stool, melena, chest pain, SOB, abdominal pain, or any other concerns.  July 26, 2017 Patient is here for further follow-up regarding iron deficiency anemia Hemoglobin remains stable. Getting regular GI checkup done.  here for further follow-up and treatment consideration MEDICAL HISTORY:  Past Medical History:  Diagnosis Date  . Adenomatous colon polyp    tubular  . AICD (automatic cardioverter/defibrillator) present 03/08/2015   MDT CRTD   . Anemia    iron deficient  . Arthritis    "about all my joints; hands, knees, back" (03/08/2015)  . Atherosclerosis   . Cataract    left eye small  . Cholelithiasis    gallstones  . Chronic systolic CHF (congestive heart failure) ([place])    a. New dx 12/2012 ? NICM, may be r/t afib. b. Nuc 03/2013 - normal;  c. 03/2015 TEE EF 15-20%.  . Colon polyp, hyperplastic 5/16   removed precancerous lesions  . Depression   . Diverticulosis   . Glaucoma    right eye  . Hyperlipidemia   . Hypertension   . Melanoma of eye ([place]) 2000's   "right; it's never been biopsied"  . Melanoma of lower leg ([place]) 2015   "left; right at my knee"  . Myocardial infarction ([place]) 1998  . OSA  (obstructive sleep apnea) 01/04/2016   uses cpap, pt does not know settings auto set  . Peripheral vision loss, right 2006  . Persistent atrial fibrillation ([place])    a. Dx 12/2012, s/p TEE/DCCV 01/26/13. b. On Xarelto (CHA2DS2VASc = 3);  c. 03/2015 TEE (EF 15-20%, no LAA thrombus) and DCCV - amio increased to 200 mg bid.  . Urinary hesitancy due to benign prostatic hypertrophy     SURGICAL HISTORY: Past Surgical History:  Procedure Laterality Date  . BACK SURGERY     upper back, cannot turn neck well  . CARDIAC CATHETERIZATION  1998  . CARDIOVERSION N/A 01/26/2013   Procedure: CARDIOVERSION;  Surgeon: Lelon Perla, MD;  Location: [facility] ENDOSCOPY;  Service: Cardiovascular;  Laterality: N/A;  . CARDIOVERSION N/A 03/23/2015   Procedure: CARDIOVERSION;  Surgeon: Jerline Pain, MD;  Location: [facility];  Service: Cardiovascular;  Laterality: N/A;  . CATARACT EXTRACTION Right ~ 2006  . COLONOSCOPY WITH PROPOFOL N/A 02/10/2015   Procedure: COLONOSCOPY WITH PROPOFOL;  Surgeon: Gatha Mayer, MD;  Location: [facility] ENDOSCOPY;  Service: Endoscopy;  Laterality: N/A;  . COLONOSCOPY WITH PROPOFOL N/A 08/07/2016   Procedure: COLONOSCOPY WITH PROPOFOL;  Surgeon: Gatha Mayer, MD;  Location: [facility] ENDOSCOPY;  Service: Endoscopy;  Laterality: N/A;  . ENTEROSCOPY N/A 08/17/2015   Procedure: ENTEROSCOPY;  Surgeon: Gatha Mayer, MD;  Location: [facility] ENDOSCOPY;  Service: Endoscopy;  Laterality: N/A;  . EP IMPLANTABLE DEVICE N/A 03/08/2015   MDT Hillery Aldo CRT-D for nonischemic CM by Dr Rayann Heman for primary prevention  . GLAUCOMA SURGERY Right ~ 2006   "put 3 stents in to drain fluid" (03/08/2015) not successful, sent to [facility] to try to get last stent out  . HOT HEMOSTASIS N/A 08/07/2016   Procedure: HOT HEMOSTASIS (ARGON PLASMA COAGULATION/BICAP);  Surgeon: Gatha Mayer, MD;  Location: [facility];  Service: Endoscopy;  Laterality: N/A;  . INCISION AND DRAINAGE ABSCESS POSTERIOR CERVICALSPINE  05/2012  . JOINT REPLACEMENT     . MELANOMA EXCISION Left 2015   "lower leg; right at my knee"  . REFRACTIVE SURGERY Right ~ 2006 X 2   "twice; both done at [facility]" (03/08/2015  . SURGERY SCROTAL / TESTICULAR Right 1990's  . TEE WITHOUT CARDIOVERSION N/A 01/26/2013   Procedure: TRANSESOPHAGEAL ECHOCARDIOGRAM (TEE);  Surgeon: Lelon Perla, MD;  Location: [facility];  Service: Cardiovascular;  Laterality: N/A;  [name] anes. /   . TEE WITHOUT CARDIOVERSION N/A 10/05/2014   Procedure: TRANSESOPHAGEAL ECHOCARDIOGRAM (TEE)  with cardioversion;  Surgeon: Thayer Headings, MD;  Location: [facility] ENDOSCOPY;  Service: Cardiovascular;  Laterality: N/A;  12:52 synched cardioversion at 120 joules,...afib to SR...12 lead EKG ordered.Marland KitchenMarland KitchenCardiozem d/c'ed per MD verbal order at SR  . TEE WITHOUT CARDIOVERSION N/A 03/23/2015   Procedure: TRANSESOPHAGEAL ECHOCARDIOGRAM (TEE);  Surgeon: Jerline Pain, MD;  Location: [facility] ENDOSCOPY;  Service: Cardiovascular;  Laterality: N/A;  . THORACIC SPINE SURGERY  03/2000   "ground calcium deposits from upper thoracic" (01/26/2013)  . TOTAL HIP ARTHROPLASTY Right 06/2007    SOCIAL HISTORY: Social History   Social History  . Marital status: Married    Spouse name: N/A  . Number of children: 3  . Years of education: N/A   Occupational History  . retired    Social History Main Topics  . Smoking status: Former Smoker    Packs/day: 3.00    Years: 48.00    Types: Cigarettes    Quit date: 09/28/2000  . Smokeless tobacco: Never Used  . Alcohol use No  . Drug use: No  . Sexual activity: Yes   Other Topics Concern  . Not on file   Social History Narrative   Pt lives in [place] with spouse.  3 children are grown and healthy.   Retired.  Ran a country store for 30 years, previously worked in the [employer] for 16 years   Married for 64 years Born in [place]. He was a Scientist, water quality, Therapist, occupational, Radio broadcast assistant. 3 sons and 3 grandchildren  Quit  smoking 14 years ago Plays golf.  FAMILY HISTORY: Family History  Problem Relation Age of Onset  . Kidney disease Mother   . Heart disease Mother        MI, open heart  . Diabetes Mother        dialysis  . Leukemia Father   . Colon cancer Paternal Uncle   . Lung cancer Paternal Uncle        x 2  . Prostate cancer Paternal Uncle   . Diabetes Maternal Grandmother   . Heart attack Maternal Uncle   . Diabetes Maternal Aunt        x 3  . Diabetes Maternal Uncle    indicated that his mother is deceased. He indicated that his father is deceased. He indicated that his maternal grandmother is deceased. He indicated that his  maternal grandfather is deceased. He indicated that his paternal grandmother is deceased. He indicated that his paternal grandfather is deceased. He indicated that the status of his maternal aunt is unknown.     Father passed away at 62 of leukemia  Mother passed away at 39 of diabetes, kidney disease and CHF.  1 brother and 2 sisters. Sister had throat cancer. Another sister had skin cancer.  ALLERGIES:  is allergic to pravastatin sodium and zithromax [azithromycin].  MEDICATIONS:  Current Outpatient Prescriptions  Medication Sig Dispense Refill  . amiodarone (PACERONE) 200 MG tablet Take 0.5 tablets (100 mg total) by mouth daily. 15 tablet 9  . carvedilol (COREG) 12.5 MG tablet Take 1 tablet (12.5 mg total) by mouth 2 (two) times daily with a meal. 180 tablet 3  . dorzolamide-timolol (COSOPT) 22.3-6.8 MG/ML ophthalmic solution Place 1 drop into the right eye 2 (two) times daily.    [name] escitalopram (LEXAPRO) 20 MG tablet TAKE 1 TABLET ONCE A DAY 90 tablet 1  . ezetimibe (ZETIA) 10 MG tablet Take 1 tablet (10 mg total) by mouth daily. 90 tablet 3  . finasteride (PROSCAR) 5 MG tablet Take 5 mg by mouth every morning.     . furosemide (LASIX) 20 MG tablet TAKE (2) TABLETS DAILY 180 tablet 3  . gabapentin (NEURONTIN) 100 MG capsule TAKE 1 TAB BY MOUTH IN THE AM AND 2  TABS BY MOUTH IN THE PM    . losartan (COZAAR) 50 MG tablet TAKE 1 TABLET IN THE MORNING AND 1/2 TABLET IN THE EVENING 135 tablet 2  . niacin (NIASPAN) 500 MG CR tablet Take 500 mg by mouth 2 (two) times daily.    [name] Glycol-Propyl Glycol (SYSTANE ULTRA OP) Place 1 drop into the left eye 2 (two) times daily.     . potassium chloride SA (K-DUR,KLOR-CON) 20 MEQ tablet TAKE  (1)  TABLET TWICE A DAY. 180 tablet 1  . rivaroxaban (XARELTO) 20 MG TABS tablet Take 1 tablet (20 mg total) by mouth daily with supper. RESTART 08/11/16 90 tablet 3  . spironolactone (ALDACTONE) 25 MG tablet TAKE (1/2) TABLET DAILY. 45 tablet 3  . tamsulosin (FLOMAX) 0.4 MG CAPS Take 0.4 mg by mouth at bedtime.      No current facility-administered medications for this visit.     Review of Systems  HENT: Negative.        No dysphagia   Eyes: Negative.   Respiratory: Negative for shortness of breath.   Cardiovascular: Negative.  Negative for chest pain, palpitations and leg swelling.  Gastrointestinal: Negative.  Negative for abdominal pain, blood in stool and melena.  Genitourinary: Negative.   Musculoskeletal: Positive for joint pain (b/l knees).  Skin: Negative.   Neurological: Positive for weakness.  Endo/Heme/Allergies: Negative.   Psychiatric/Behavioral: Negative.   All other systems reviewed and are negative. 14 point ROS was done and is otherwise as detailed above or in HPI  PHYSICAL EXAMINATION: ECOG PERFORMANCE STATUS: 0 - Asymptomatic  There were no vitals filed for this visit. There were no vitals filed for this visit. Physical Exam  Constitutional: He is oriented to person, place, and time and well-developed, well-nourished, and in no distress. No distress.  HENT:  Head: Normocephalic and atraumatic.  Mouth/Throat: Oropharynx is clear and moist. No oropharyngeal exudate.  Eyes: Pupils are equal, round, and reactive to light. Conjunctivae and EOM are normal. No scleral icterus.  Neck:  Normal range of motion. Neck supple. No JVD present.  Cardiovascular: Normal  rate, regular rhythm and normal heart sounds.  Exam reveals no gallop and no friction rub.   No murmur heard. Pulmonary/Chest: Effort normal and breath sounds normal. No respiratory distress. He has no wheezes. He has no rales.  Abdominal: Soft. Bowel sounds are normal. He exhibits no distension. There is no tenderness. There is no guarding.  Musculoskeletal: Normal range of motion. He exhibits no edema or tenderness.  Lymphadenopathy:    He has no cervical adenopathy.  Neurological: He is alert and oriented to person, place, and time. No cranial nerve deficit. Gait normal.  Skin: Skin is warm and dry. No rash noted. No erythema. No pallor.  Psychiatric: Affect and judgment normal.  Nursing note and vitals reviewed.  LABORATORY DATA:  I have reviewed the data as listed CBC Latest Ref Rng & Units 07/26/2017 01/24/2017 11/21/2016  WBC 4.0 - 10.5 K/uL 4.7 4.5 4.2  Hemoglobin 13.0 - 17.0 g/dL 11.6(L) 11.9(L) 11.5(L)  Hematocrit 39.0 - 52.0 % 35.6(L) 35.3(L) 35.8(L)  Platelets 150 - 400 K/uL 127(L) 130(L) 127(L)   CMP Latest Ref Rng & Units 01/24/2017 11/21/2016 07/25/2016  Glucose 65 - 99 mg/dL 94 178(H) 142(H)  BUN 6 - 20 mg/dL 13 15 11   Creatinine 0.61 - 1.24 mg/dL 1.46(H) 1.40(H) 1.29(H)  Sodium 135 - 145 mmol/L 140 138 136  Potassium 3.5 - 5.1 mmol/L 4.1 4.0 4.2  Chloride 101 - 111 mmol/L 105 103 101  CO2 22 - 32 mmol/L 30 28 31   Calcium 8.9 - 10.3 mg/dL 8.5(L) 8.3(L) 8.3(L)  Total Protein 6.5 - 8.1 g/dL 7.0 6.7 6.9  Total Bilirubin 0.3 - 1.2 mg/dL 0.5 0.4 0.5  Alkaline Phos 38 - 126 U/L 98 99 110  AST 15 - 41 U/L 16 19 19   ALT 17 - 63 U/L 16(L) 18 19   RADIOGRAPHIC STUDIES: I have personally reviewed the radiological images as listed and agreed with the findings in the report.  DG Chest 2 View 10/08/2016 IMPRESSION: Mild chronic bronchitic changes. No pneumonia, CHF, nor other acute cardiopulmonary  abnormality.  ASSESSMENT & PLAN:  T2a N0 Melanoma of the L Thigh s/p WLE and sentinel node with Dr. Freddi Che Abnormal CT and MRI imaging of the abdomen Anemia/Thrombocytopenia Colonoscopy 02/10/2015 with 5 polyps and sigmoid diverticulosis Iron deficiency with intolerance to oral iron Pancreatic cyst CKD  Labs reviewed today. Hgb is stable at 11.6 g/dL today. Suspect his anemia has a component of chronic anemia from CKD. Ferritin and iron and iron binding capacity not available today I have reviewed CT abd/pelvis from 07/2016 with the patient in detail today. He has a stable cyst on his pancreatic head. Per radiology recommendations, he will get a repeat scan in 07/2018 to assess stability.   It has been 13 years since his original melanoma diagnosis- he is cured.    Pancreatic cyst another CT scan has been recommended for 2019 T   Return clinic appointment in 6 months with CBC iron iron-binding capacity and ferritin  Ruby Dilone  MD

## 2017-07-26 NOTE — Patient Instructions (Addendum)
[place] at [facility] Discharge Instructions  RECOMMENDATIONS MADE BY THE CONSULTANT AND ANY TEST RESULTS WILL BE SENT TO YOUR REFERRING PHYSICIAN.  You were seen today by Dr. Oliva Bustard Follow up in 6 months with labs  Thank you for choosing [place] at [facility] to provide your oncology and hematology care.  To afford each patient quality time with our provider, please arrive at least 15 minutes before your scheduled appointment time.    If you have a lab appointment with the [facility] please come in thru the  Main Entrance and check in at the main information desk  You need to re-schedule your appointment should you arrive 10 or more minutes late.  We strive to give you quality time with our providers, and arriving late affects you and other patients whose appointments are after yours.  Also, if you no show three or more times for appointments you may be dismissed from the clinic at the providers discretion.     Again, thank you for choosing [facility].  Our hope is that these requests will decrease the amount of time that you wait before being seen by our physicians.       _____________________________________________________________  Should you have questions after your visit to [facility], please contact our office at [phone] between the hours of 8:30 a.m. and 4:30 p.m.  Voicemails left after 4:30 p.m. will not be returned until the following business day.  For prescription refill requests, have your pharmacy contact our office.       Resources For Cancer Patients and their Caregivers ? American Cancer Society: Can assist with transportation, wigs, general needs, runs Look Good Feel Better.        813-314-4461 ? Cancer Care: Provides financial assistance, online support groups, medication/co-pay assistance.  1-800-813-HOPE 671-203-6021) ? [place] Assists [place]  cancer patients and their families through emotional , educational and financial support.  313 142 7441 ? [place] DSS Where to apply for food stamps, Medicaid and utility assistance. 606-516-7894 ? [facility]: Transportation to medical appointments. 325-154-1521 ? Social Security Administration: May apply for disability if have a Stage IV cancer. 228 643 8311 726-189-0453 ? [employer], Disability and Transit Services: Assists with nutrition, care and transit needs. [place] Support Programs: @10RELATIVEDAYS @ > Cancer Support Group  2nd Tuesday of the month 1pm-2pm, Journey Room  > Creative Journey  3rd Tuesday of the month 1130am-1pm, Journey Room  > Look Good Feel Better  1st Wednesday of the month 10am-12 noon, Journey Room (Call [facility] to register [phone])

## 2017-07-29 ENCOUNTER — Ambulatory Visit (HOSPITAL_COMMUNITY)
Admission: RE | Admit: 2017-07-29 | Discharge: 2017-07-29 | Disposition: A | Discharge status: Home / Self Care | Payer: Medicare Other | Source: Ambulatory Visit | Attending: Nurse Practitioner | Admitting: Nurse Practitioner

## 2017-07-29 ENCOUNTER — Other Ambulatory Visit: Payer: Self-pay

## 2017-07-29 VITALS — BP 100/60 | HR 97 | Wt 231.5 lb

## 2017-07-29 DIAGNOSIS — I5022 Chronic systolic (congestive) heart failure: Secondary | ICD-10-CM | POA: Diagnosis not present

## 2017-07-29 DIAGNOSIS — Z85840 Personal history of malignant neoplasm of eye: Secondary | ICD-10-CM | POA: Insufficient documentation

## 2017-07-29 DIAGNOSIS — G4733 Obstructive sleep apnea (adult) (pediatric): Secondary | ICD-10-CM | POA: Insufficient documentation

## 2017-07-29 DIAGNOSIS — Z79899 Other long term (current) drug therapy: Secondary | ICD-10-CM | POA: Insufficient documentation

## 2017-07-29 DIAGNOSIS — Z85820 Personal history of malignant melanoma of skin: Secondary | ICD-10-CM | POA: Diagnosis not present

## 2017-07-29 DIAGNOSIS — H409 Unspecified glaucoma: Secondary | ICD-10-CM | POA: Diagnosis not present

## 2017-07-29 DIAGNOSIS — Z8249 Family history of ischemic heart disease and other diseases of the circulatory system: Secondary | ICD-10-CM | POA: Insufficient documentation

## 2017-07-29 DIAGNOSIS — Z833 Family history of diabetes mellitus: Secondary | ICD-10-CM | POA: Insufficient documentation

## 2017-07-29 DIAGNOSIS — Z95810 Presence of automatic (implantable) cardiac defibrillator: Secondary | ICD-10-CM | POA: Insufficient documentation

## 2017-07-29 DIAGNOSIS — I481 Persistent atrial fibrillation: Secondary | ICD-10-CM | POA: Diagnosis not present

## 2017-07-29 DIAGNOSIS — Z7901 Long term (current) use of anticoagulants: Secondary | ICD-10-CM | POA: Diagnosis not present

## 2017-07-29 DIAGNOSIS — Z888 Allergy status to other drugs, medicaments and biological substances status: Secondary | ICD-10-CM | POA: Diagnosis not present

## 2017-07-29 DIAGNOSIS — I429 Cardiomyopathy, unspecified: Secondary | ICD-10-CM | POA: Diagnosis not present

## 2017-07-29 DIAGNOSIS — I110 Hypertensive heart disease with heart failure: Secondary | ICD-10-CM | POA: Insufficient documentation

## 2017-07-29 DIAGNOSIS — Z9889 Other specified postprocedural states: Secondary | ICD-10-CM | POA: Diagnosis not present

## 2017-07-29 DIAGNOSIS — Z8042 Family history of malignant neoplasm of prostate: Secondary | ICD-10-CM | POA: Insufficient documentation

## 2017-07-29 DIAGNOSIS — Z801 Family history of malignant neoplasm of trachea, bronchus and lung: Secondary | ICD-10-CM | POA: Insufficient documentation

## 2017-07-29 DIAGNOSIS — E785 Hyperlipidemia, unspecified: Secondary | ICD-10-CM | POA: Insufficient documentation

## 2017-07-29 DIAGNOSIS — I4819 Other persistent atrial fibrillation: Secondary | ICD-10-CM

## 2017-07-29 DIAGNOSIS — F329 Major depressive disorder, single episode, unspecified: Secondary | ICD-10-CM | POA: Diagnosis not present

## 2017-07-29 DIAGNOSIS — I252 Old myocardial infarction: Secondary | ICD-10-CM | POA: Insufficient documentation

## 2017-07-29 DIAGNOSIS — C439 Malignant melanoma of skin, unspecified: Secondary | ICD-10-CM | POA: Diagnosis not present

## 2017-07-29 DIAGNOSIS — Z96641 Presence of right artificial hip joint: Secondary | ICD-10-CM | POA: Diagnosis not present

## 2017-07-29 DIAGNOSIS — Z800 Family history of malignant neoplasm of digestive organs: Secondary | ICD-10-CM | POA: Diagnosis not present

## 2017-07-29 DIAGNOSIS — Z87891 Personal history of nicotine dependence: Secondary | ICD-10-CM | POA: Diagnosis not present

## 2017-07-29 MED ORDER — AMIODARONE HCL 200 MG PO TABS: 200.0000 mg | ORAL_TABLET | Freq: Every day | ORAL | 9 refills | Status: DC

## 2017-07-29 NOTE — Progress Notes (Signed)
Primary Care Physician: Dione Housekeeper, MD Referring Physician: Dr. Rayann Heman Cardiologist: Dr. Doreene Eland is a 76 y.o. male with a h/o  paroxysmal atrial fibrillation and chronic systolic CHF thought to be due to nonischemic cardiomyopathy. cardiac MRI showed persistently low EF, so given LBBB, he had Medtronic CRT-D device placed.   Repeat echo in 2/17 showed EF 55-60%.   He is in the afib today, 10/29 at request of device clinic for paceart report showing afib x 10 days to two weeks. Pt keeps his HR and BP at home and his readings show increase in HR( mostly less than 100 ) mid October. He has felt more tired since then. Dr. Jackalyn Lombard  recommendation was to increase amiodarone to 200 mg daily and proceed with  cardioversion in a few weeks.Weight is stable.  Today, he denies symptoms of palpitations, chest pain,  orthopnea, PND, lower extremity edema, dizziness, presyncope, syncope, or neurologic sequela. + for fatigue/shortness of breath. The patient is tolerating medications without difficulties and is otherwise without complaint today.   Past Medical History:  Diagnosis Date  . Adenomatous colon polyp    tubular  . AICD (automatic cardioverter/defibrillator) present 03/08/2015   MDT CRTD   . Anemia    iron deficient  . Arthritis    "about all my joints; hands, knees, back" (03/08/2015)  . Atherosclerosis   . Cataract    left eye small  . Cholelithiasis    gallstones  . Chronic systolic CHF (congestive heart failure) ([place])    a. New dx 12/2012 ? NICM, may be r/t afib. b. Nuc 03/2013 - normal;  c. 03/2015 TEE EF 15-20%.  . Colon polyp, hyperplastic 5/16   removed precancerous lesions  . Depression   . Diverticulosis   . Glaucoma    right eye  . Hyperlipidemia   . Hypertension   . Melanoma of eye ([place]) 2000's   "right; it's never been biopsied"  . Melanoma of lower leg ([place]) 2015   "left; right at my knee"  . Myocardial infarction ([place]) 1998  . OSA (obstructive  sleep apnea) 01/04/2016   uses cpap, pt does not know settings auto set  . Peripheral vision loss, right 2006  . Persistent atrial fibrillation ([place])    a. Dx 12/2012, s/p TEE/DCCV 01/26/13. b. On Xarelto (CHA2DS2VASc = 3);  c. 03/2015 TEE (EF 15-20%, no LAA thrombus) and DCCV - amio increased to 200 mg bid.  . Urinary hesitancy due to benign prostatic hypertrophy    Past Surgical History:  Procedure Laterality Date  . BACK SURGERY     upper back, cannot turn neck well  . CARDIAC CATHETERIZATION  1998  . CARDIOVERSION N/A 01/26/2013   Procedure: CARDIOVERSION;  Surgeon: Lelon Perla, MD;  Location[facility] ENDOSCOPY;  Service: Cardiovascular;  Laterality: N/A;  . CARDIOVERSION N/A 03/23/2015   Procedure: CARDIOVERSION;  Surgeon: Jerline Pain, MD;  Location: [place];  Service: Cardiovascular;  Laterality: N/A;  . CATARACT EXTRACTION Right ~ 2006  . COLONOSCOPY WITH PROPOFOL N/A 02/10/2015   Procedure: COLONOSCOPY WITH PROPOFOL;  Surgeon: Gatha Mayer, MD;  Location: [facility] ENDOSCOPY;  Service: Endoscopy;  Laterality: N/A;  . COLONOSCOPY WITH PROPOFOL N/A 08/07/2016   Procedure: COLONOSCOPY WITH PROPOFOL;  Surgeon: Gatha Mayer, MD;  Location: [facility] ENDOSCOPY;  Service: Endoscopy;  Laterality: N/A;  . ENTEROSCOPY N/A 08/17/2015   Procedure: ENTEROSCOPY;  Surgeon: Gatha Mayer, MD;  Location: [facility] ENDOSCOPY;  Service: Endoscopy;  Laterality: N/A;  .  EP IMPLANTABLE DEVICE N/A 03/08/2015   MDT Hillery Aldo CRT-D for nonischemic CM by Dr Rayann Heman for primary prevention  . GLAUCOMA SURGERY Right ~ 2006   "put 3 stents in to drain fluid" (03/08/2015) not successful, [facility]  . HOT HEMOSTASIS N/A 08/07/2016   Procedure: HOT HEMOSTASIS (ARGON PLASMA COAGULATION/BICAP);  Surgeon: Gatha Mayer, MD;  Location: [facility] ENDOS[facility];  Service: Endoscopy;  Laterality: N/A;  . INCISION AND DRAINAGE ABSCESS POSTERIOR CERVICALSPINE  05/2012  . JOINT REPLACEMENT    . MELANOMA EXCISION Left 2015     "lower leg; right at my knee"  . REFRACTIVE SURGERY Right ~ 2006 X 2   "twice; both done at [place]" (03/08/2015  . SURGERY SCROTAL / TESTICULAR Right 1990's  . TEE WITHOUT CARDIOVERSION N/A 01/26/2013   Procedure: TRANSESOPHAGEAL ECHOCARDIOGRAM (TEE);  Surgeon: Lelon Perla, MD;  Location: [place];  Service: Cardiovascular;  Laterality: N/A;  [name] anes. /   . TEE WITHOUT CARDIOVERSION N/A 10/05/2014   Procedure: TRANSESOPHAGEAL ECHOCARDIOGRAM (TEE)  with cardioversion;  Surgeon: Thayer Headings, MD;  Location[facility] END[facility]COPY;  Service: Cardiovascular;  Laterality: N/A;  12:52 synched cardioversion at 120 joules,...afib to SR...12 lead EKG ordered.Marland KitchenMarland KitchenCardiozem d/c'ed per MD verbal order at SR  . TEE WITHOUT CARDIOVERSION N/A 03/23/2015   Procedure: TRANSESOPHAGEAL ECHOCARDIOGRAM (TEE);  Surgeon: Jerline Pain, MD;  [facility] ENDOSCOPY;  Service: Cardiovascular;  Laterality: N/A;  . THORACIC SPINE SURGERY  03/2000   "ground calcium deposits from upper thoracic" (01/26/2013)  . TOTAL HIP ARTHROPLASTY Right 06/2007    Current Outpatient Prescriptions  Medication Sig Dispense Refill  . amiodarone (PACERONE) 200 MG tablet Take 1 tablet (200 mg total) by mouth daily. 15 tablet 9  . carvedilol (COREG) 12.5 MG tablet Take 1 tablet (12.5 mg total) by mouth 2 (two) times daily with a meal. 180 tablet 3  . dorzolamide-timolol (COSOPT) 22.3-6.8 MG/ML ophthalmic solution Place 1 drop into the right eye 2 (two) times daily.    [name] escitalopram (LEXAPRO) 20 MG tablet TAKE 1 TABLET ONCE A DAY 90 tablet 1  . ezetimibe (ZETIA) 10 MG tablet Take 1 tablet (10 mg total) by mouth daily. 90 tablet 3  . finasteride (PROSCAR) 5 MG tablet Take 5 mg by mouth every morning.     . furosemide (LASIX) 20 MG tablet TAKE (2) TABLETS DAILY 180 tablet 3  . gabapentin (NEURONTIN) 100 MG capsule TAKE 1 TAB BY MOUTH IN THE AM AND 2 TABS BY MOUTH IN THE PM    . LATANOPROST OP Apply 1 drop to eye daily.    [name] losartan (COZAAR) 50  MG tablet TAKE 1 TABLET IN THE MORNING AND 1/2 TABLET IN THE EVENING 135 tablet 2  . Netarsudil Dimesylate (RHOPRESSA OP) Place 1 drop into the right eye daily.    . niacin (NIASPAN) 500 MG CR tablet Take 500 mg by mouth 2 (two) times daily.    [name] Glycol-Propyl Glycol (SYSTANE ULTRA OP) Place 1 drop into the left eye 2 (two) times daily.     . potassium chloride SA (K-DUR,KLOR-CON) 20 MEQ tablet TAKE  (1)  TABLET TWICE A DAY. 180 tablet 1  . rivaroxaban (XARELTO) 20 MG TABS tablet Take 1 tablet (20 mg total) by mouth daily with supper. RESTART 08/11/16 90 tablet 3  . spironolactone (ALDACTONE) 25 MG tablet TAKE (1/2) TABLET DAILY. 45 tablet 3  . tamsulosin (FLOMAX) 0.4 MG CAPS Take 0.4 mg  by mouth at bedtime.      No current facility-administered medications for this encounter.     Allergies  Allergen Reactions  . Pravastatin Sodium Other (See Comments)    Joint and muscle pain  . Zithromax [Azithromycin] Diarrhea    Social History   Social History  . Marital status: Married    Spouse name: N/A  . Number of children: 3  . Years of education: N/A   Occupational History  . retired    Social History Main Topics  . Smoking status: Former Smoker    Packs/day: 3.00    Years: 48.00    Types: Cigarettes    Quit date: 09/28/2000  . Smokeless tobacco: Never Used  . Alcohol use No  . Drug use: No  . Sexual activity: Yes   Other Topics Concern  . Not on file   Social History Narrative   Pt lives in [place] with spouse.  3 children are grown and healthy.   Retired.  Ran a country store for 30 years, previously worked in the [employer] for 16 years    Family History  Problem Relation Age of Onset  . Kidney disease Mother   . Heart disease Mother        MI, open heart  . Diabetes Mother        dialysis  . Leukemia Father   . Colon cancer Paternal Uncle   . Lung cancer Paternal Uncle        x 2  . Prostate cancer Paternal Uncle   . Diabetes Maternal  Grandmother   . Heart attack Maternal Uncle   . Diabetes Maternal Aunt        x 3  . Diabetes Maternal Uncle     ROS- All systems are reviewed and negative except as per the HPI above  Physical Exam: Vitals:   07/29/17 1450  BP: 100/60  Pulse: 97  SpO2: 95%  Weight: 231 lb 8 oz (105 kg)   Wt Readings from Last 3 Encounters:  07/29/17 231 lb 8 oz (105 kg)  07/26/17 230 lb 9.6 oz (104.6 kg)  04/08/17 227 lb 12.8 oz (103.3 kg)    Labs: Lab Results  Component Value Date   NA 139 07/26/2017   K 4.0 07/26/2017   CL 103 07/26/2017   CO2 29 07/26/2017   GLUCOSE 123 (H) 07/26/2017   BUN 11 07/26/2017   CREATININE 1.36 (H) 07/26/2017   CALCIUM 8.7 (L) 07/26/2017   MG 2.3 10/08/2014   Lab Results  Component Value Date   INR 1.69 (H) 12/16/2014   Lab Results  Component Value Date   CHOL 200 03/22/2015   HDL 26 (L) 03/22/2015   LDLCALC 153 (H) 03/22/2015   TRIG 105 03/22/2015     GEN- The patient is well appearing, alert and oriented x 3 today.   Head- normocephalic, atraumatic Eyes-  Sclera clear, conjunctiva pink Ears- hearing intact Oropharynx- clear Neck- supple, no JVP Lymph- no cervical lymphadenopathy Lungs- Clear to ausculation bilaterally, normal work of breathing Heart- Regular rate and rhythm, no murmurs, rubs or gallops, PMI not laterally displaced GI- soft, NT, ND, + BS Extremities- no clubbing, cyanosis, or edema MS- no significant deformity or atrophy Skin- no rash or lesion Psych- euthymic mood, full affect Neuro- strength and sensation are intact  EKG-v paced rhythm at at 98 bpm Epic records reviewed    Assessment and Plan: 1. Persistent afib x 10 days to 2 weeks Increase amiodarone  to 200 mg daily, from 100 mg daily Continue xarelto for a chadsvasc score of at least 4, states no missed doses  2, HTN  Stable  3. CHF Weight stable  Will see back in 2 weeks with interrogation and if still in persistent  afib will set up [place]. [place], [facility] [address] [place], [place] [address] [phone]

## 2017-07-29 NOTE — Patient Instructions (Signed)
Your physician has recommended you make the following change in your medication:  1)Increase Amiodarone to 200mg  once a day

## 2017-07-30 ENCOUNTER — Other Ambulatory Visit: Payer: Self-pay | Admitting: Internal Medicine

## 2017-08-08 ENCOUNTER — Other Ambulatory Visit: Payer: Self-pay | Admitting: Internal Medicine

## 2017-08-08 NOTE — Telephone Encounter (Signed)
I think this must have been something I did when he needed it in a pinch or it was a Retail banker after colonoscopy  We should redirect to either cardiology or PCP to refill

## 2017-08-08 NOTE — Telephone Encounter (Signed)
Please advise Sir? 

## 2017-08-12 ENCOUNTER — Other Ambulatory Visit (HOSPITAL_COMMUNITY): Payer: Self-pay | Admitting: *Deleted

## 2017-08-13 ENCOUNTER — Other Ambulatory Visit: Payer: Self-pay

## 2017-08-13 ENCOUNTER — Telehealth (HOSPITAL_COMMUNITY): Payer: Self-pay

## 2017-08-13 ENCOUNTER — Encounter (HOSPITAL_COMMUNITY): Payer: Self-pay | Admitting: Nurse Practitioner

## 2017-08-13 ENCOUNTER — Other Ambulatory Visit (HOSPITAL_COMMUNITY): Payer: Self-pay | Admitting: *Deleted

## 2017-08-13 ENCOUNTER — Ambulatory Visit (HOSPITAL_COMMUNITY)
Admission: RE | Admit: 2017-08-13 | Discharge: 2017-08-13 | Disposition: A | Discharge status: Home / Self Care | Payer: Medicare Other | Source: Ambulatory Visit | Attending: Nurse Practitioner | Admitting: Nurse Practitioner

## 2017-08-13 VITALS — BP 114/82 | HR 92 | Ht 66.0 in | Wt 230.8 lb

## 2017-08-13 DIAGNOSIS — I252 Old myocardial infarction: Secondary | ICD-10-CM | POA: Diagnosis not present

## 2017-08-13 DIAGNOSIS — Z79899 Other long term (current) drug therapy: Secondary | ICD-10-CM | POA: Diagnosis not present

## 2017-08-13 DIAGNOSIS — I480 Paroxysmal atrial fibrillation: Secondary | ICD-10-CM | POA: Diagnosis not present

## 2017-08-13 DIAGNOSIS — I4891 Unspecified atrial fibrillation: Secondary | ICD-10-CM | POA: Diagnosis present

## 2017-08-13 DIAGNOSIS — I447 Left bundle-branch block, unspecified: Secondary | ICD-10-CM | POA: Diagnosis not present

## 2017-08-13 DIAGNOSIS — Z85840 Personal history of malignant neoplasm of eye: Secondary | ICD-10-CM | POA: Insufficient documentation

## 2017-08-13 DIAGNOSIS — Z87891 Personal history of nicotine dependence: Secondary | ICD-10-CM | POA: Diagnosis not present

## 2017-08-13 DIAGNOSIS — H409 Unspecified glaucoma: Secondary | ICD-10-CM | POA: Insufficient documentation

## 2017-08-13 DIAGNOSIS — M1990 Unspecified osteoarthritis, unspecified site: Secondary | ICD-10-CM | POA: Insufficient documentation

## 2017-08-13 DIAGNOSIS — I110 Hypertensive heart disease with heart failure: Secondary | ICD-10-CM | POA: Diagnosis not present

## 2017-08-13 DIAGNOSIS — G4733 Obstructive sleep apnea (adult) (pediatric): Secondary | ICD-10-CM | POA: Insufficient documentation

## 2017-08-13 DIAGNOSIS — Z85820 Personal history of malignant melanoma of skin: Secondary | ICD-10-CM | POA: Diagnosis not present

## 2017-08-13 DIAGNOSIS — E785 Hyperlipidemia, unspecified: Secondary | ICD-10-CM | POA: Insufficient documentation

## 2017-08-13 DIAGNOSIS — F329 Major depressive disorder, single episode, unspecified: Secondary | ICD-10-CM | POA: Insufficient documentation

## 2017-08-13 DIAGNOSIS — Z7901 Long term (current) use of anticoagulants: Secondary | ICD-10-CM | POA: Diagnosis not present

## 2017-08-13 DIAGNOSIS — I429 Cardiomyopathy, unspecified: Secondary | ICD-10-CM | POA: Insufficient documentation

## 2017-08-13 DIAGNOSIS — I481 Persistent atrial fibrillation: Secondary | ICD-10-CM | POA: Insufficient documentation

## 2017-08-13 DIAGNOSIS — I4819 Other persistent atrial fibrillation: Secondary | ICD-10-CM

## 2017-08-13 DIAGNOSIS — Z95810 Presence of automatic (implantable) cardiac defibrillator: Secondary | ICD-10-CM | POA: Insufficient documentation

## 2017-08-13 DIAGNOSIS — I5022 Chronic systolic (congestive) heart failure: Secondary | ICD-10-CM | POA: Insufficient documentation

## 2017-08-13 LAB — BASIC METABOLIC PANEL
ANION GAP: 5 (ref 5–15)
BUN: 11 mg/dL (ref 6–20)
CALCIUM: 8.2 mg/dL — AB (ref 8.9–10.3)
CO2: 27 mmol/L (ref 22–32)
Chloride: 104 mmol/L (ref 101–111)
Creatinine, Ser: 1.34 mg/dL — ABNORMAL HIGH (ref 0.61–1.24)
GFR calc Af Amer: 58 mL/min — ABNORMAL LOW (ref >60)
GFR, EST NON AFRICAN AMERICAN: 50 mL/min — AB (ref >60)
GLUCOSE: 117 mg/dL — AB (ref 65–99)
Potassium: 4.5 mmol/L (ref 3.5–5.1)
Sodium: 136 mmol/L (ref 135–145)

## 2017-08-13 LAB — CBC
HCT: 36.2 % — ABNORMAL LOW (ref 39.0–52.0)
HEMOGLOBIN: 11.5 g/dL — AB (ref 13.0–17.0)
MCH: 31.2 pg (ref 26.0–34.0)
MCHC: 31.8 g/dL (ref 30.0–36.0)
MCV: 98.1 fL (ref 78.0–100.0)
Platelets: 129 10*3/uL — ABNORMAL LOW (ref 150–400)
RBC: 3.69 MIL/uL — ABNORMAL LOW (ref 4.22–5.81)
RDW: 15.7 % — AB (ref 11.5–15.5)
WBC: 4.8 10*3/uL (ref 4.0–10.5)

## 2017-08-13 MED ORDER — RIVAROXABAN 20 MG PO TABS: 20.0000 mg | ORAL_TABLET | Freq: Every day | ORAL | 3 refills | Status: DC

## 2017-08-13 NOTE — H&P (View-Only) (Signed)
Primary Care Physician: Dione Housekeeper, MD Referring Physician: Dr. Rayann Heman Cardiologist: Dr. Doreene Eland is a 76 y.o. male with a h/o  paroxysmal atrial fibrillation and chronic systolic CHF thought to be due to nonischemic cardiomyopathy. cardiac MRI showed persistently low EF, so given LBBB, he had Medtronic CRT-D device placed.   Repeat echo in 2/17 showed EF 55-60%.   He is in the afib today, 10/29 at request of device clinic for paceart report showing afib x 10 days to two weeks. Pt keeps his HR and BP at home and his readings show increase in HR( mostly less than 100 ) mid October. He has felt more tired since then. Dr. Jackalyn Lombard  recommendation was to increase amiodarone to 200 mg daily and proceed with  cardioversion in a few weeks.Weight is stable.  Return to afib clinic,11/13, device interrogated and he remains in persistent afib. He also remains more short of breath. He has not missed any doses of xarelto. Will plan  for cardioversion.  Today, he denies symptoms of palpitations, chest pain,  orthopnea, PND, lower extremity edema, dizziness, presyncope, syncope, or neurologic sequela. + for fatigue/shortness of breath. The patient is tolerating medications without difficulties and is otherwise without complaint today.   Past Medical History:  Diagnosis Date  . Adenomatous colon polyp    tubular  . AICD (automatic cardioverter/defibrillator) present 03/08/2015   MDT CRTD   . Anemia    iron deficient  . Arthritis    "about all my joints; hands, knees, back" (03/08/2015)  . Atherosclerosis   . Cataract    left eye small  . Cholelithiasis    gallstones  . Chronic systolic CHF (congestive heart failure) ([place])    a. New dx 12/2012 ? NICM, may be r/t afib. b. Nuc 03/2013 - normal;  c. 03/2015 TEE EF 15-20%.  . Colon polyp, hyperplastic 5/16   removed precancerous lesions  . Depression   . Diverticulosis   . Glaucoma    right eye  . Hyperlipidemia   . Hypertension    . Melanoma of eye ([place]) 2000's   "right; it's never been biopsied"  . Melanoma of lower leg ([place]) 2015   "left; right at my knee"  . Myocardial infarction ([place]) 1998  . OSA (obstructive sleep apnea) 01/04/2016   uses cpap, pt does not know settings auto set  . Peripheral vision loss, right 2006  . Persistent atrial fibrillation ([place])    a. Dx 12/2012, s/p TEE/DCCV 01/26/13. b. On Xarelto (CHA2DS2VASc = 3);  c. 03/2015 TEE (EF 15-20%, no LAA thrombus) and DCCV - amio increased to 200 mg bid.  . Urinary hesitancy due to benign prostatic hypertrophy    Past Surgical History:  Procedure Laterality Date  . BACK SURGERY     upper back, cannot turn neck well  . CARDIAC CATHETERIZATION  1998  . CATARACT EXTRACTION Right ~ 2006  . GLAUCOMA SURGERY Right ~ 2006   "put 3 stents in to drain fluid" (03/08/2015) not successful, [facility]  . INCISION AND DRAINAGE ABSCESS POSTERIOR CERVICALSPINE  05/2012  . JOINT REPLACEMENT    . MELANOMA EXCISION Left 2015   "lower leg; right at my knee"  . REFRACTIVE SURGERY Right ~ 2006 X 2   "twice; both done at [place]" (03/08/2015  . SURGERY SCROTAL / TESTICULAR Right 1990's  . THORACIC SPINE SURGERY  03/2000   "ground calcium deposits from upper thoracic" (01/26/2013)  .  TOTAL HIP ARTHROPLASTY Right 06/2007    Current Outpatient Medications  Medication Sig Dispense Refill  . amiodarone (PACERONE) 200 MG tablet Take 1 tablet (200 mg total) by mouth daily. 15 tablet 9  . carvedilol (COREG) 12.5 MG tablet Take 1 tablet (12.5 mg total) by mouth 2 (two) times daily with a meal. 180 tablet 3  . dorzolamide-timolol (COSOPT) 22.3-6.8 MG/ML ophthalmic solution Place 1 drop into the right eye 2 (two) times daily.    [name] escitalopram (LEXAPRO) 20 MG tablet TAKE 1 TABLET ONCE A DAY 90 tablet 1  . ezetimibe (ZETIA) 10 MG tablet Take 1 tablet (10 mg total) by mouth daily. 90 tablet 3  . finasteride (PROSCAR) 5 MG tablet Take 5 mg by mouth every  morning.     . furosemide (LASIX) 20 MG tablet TAKE (2) TABLETS DAILY 180 tablet 3  . gabapentin (NEURONTIN) 100 MG capsule TAKE 2 TAB BY MOUTH IN THE AM AND 2 TABS BY MOUTH IN THE PM    . LATANOPROST OP Apply 1 drop to eye daily.    [name] losartan (COZAAR) 50 MG tablet TAKE 1 TABLET IN THE MORNING AND 1/2 TABLET IN THE EVENING 135 tablet 2  . Netarsudil Dimesylate (RHOPRESSA OP) Place 1 drop into the right eye daily.    . niacin (NIASPAN) 500 MG CR tablet Take 500 mg by mouth 2 (two) times daily.    [name] Glycol-Propyl Glycol (SYSTANE ULTRA OP) Place 1 drop into the left eye 2 (two) times daily.     . potassium chloride SA (K-DUR,KLOR-CON) 20 MEQ tablet TAKE  (1)  TABLET TWICE A DAY. (Patient taking differently: TAKE  (1)  TABLET ONCE  A DAY.) 180 tablet 1  . rivaroxaban (XARELTO) 20 MG TABS tablet Take 1 tablet (20 mg total) by mouth daily with supper. RESTART 08/11/16 90 tablet 3  . spironolactone (ALDACTONE) 25 MG tablet TAKE (1/2) TABLET DAILY. 45 tablet 3  . tamsulosin (FLOMAX) 0.4 MG CAPS Take 0.4 mg by mouth at bedtime.      No current facility-administered medications for this encounter.     Allergies  Allergen Reactions  . Pravastatin Sodium Other (See Comments)    Joint and muscle pain  . Zithromax [Azithromycin] Diarrhea    Social History   Socioeconomic History  . Marital status: Married    Spouse name: Not on file  . Number of children: 3  . Years of education: Not on file  . Highest education level: Not on file  Social Needs  . Financial resource strain: Not on file  . Food insecurity - worry: Not on file  . Food insecurity - inability: Not on file  . Transportation needs - medical: Not on file  . Transportation needs - non-medical: Not on file  Occupational History  . Occupation: retired  Tobacco Use  . Smoking status: Former Smoker    Packs/day: 3.00    Years: 48.00    Pack years: 144.00    Types: Cigarettes    Last attempt to quit: 09/28/2000    Years  since quitting: 16.8  . Smokeless tobacco: Never Used  Substance and Sexual Activity  . Alcohol use: No  . Drug use: No  . Sexual activity: Yes  Other Topics Concern  . Not on file  Social History Narrative   Pt lives in [place] with spouse.  3 children are grown and healthy.   Retired.  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 Kidney disease Mother   . Heart disease Mother        MI, open heart  . Diabetes Mother        dialysis  . Leukemia Father   . Colon cancer Paternal Uncle   . Lung cancer Paternal Uncle        x 2  . Prostate cancer Paternal Uncle   . Diabetes Maternal Grandmother   . Heart attack Maternal Uncle   . Diabetes Maternal Aunt        x 3  . Diabetes Maternal Uncle     ROS- All systems are reviewed and negative except as per the HPI above  Physical Exam: Vitals:   08/13/17 0855  BP: 114/82  Pulse: 92  Weight: 230 lb 12.8 oz (104.7 kg)  Height: 5\' 6"  (1.676 m)   Wt Readings from Last 3 Encounters:  08/13/17 230 lb 12.8 oz (104.7 kg)  07/29/17 231 lb 8 oz (105 kg)  07/26/17 230 lb 9.6 oz (104.6 kg)    Labs: Lab Results  Component Value Date   NA 139 07/26/2017   K 4.0 07/26/2017   CL 103 07/26/2017   CO2 29 07/26/2017   GLUCOSE 123 (H) 07/26/2017   BUN 11 07/26/2017   CREATININE 1.36 (H) 07/26/2017   CALCIUM 8.7 (L) 07/26/2017   MG 2.3 10/08/2014   Lab Results  Component Value Date   INR 1.69 (H) 12/16/2014   Lab Results  Component Value Date   CHOL 200 03/22/2015   HDL 26 (L) 03/22/2015   LDLCALC 153 (H) 03/22/2015   TRIG 105 03/22/2015     GEN- The patient is well appearing, alert and oriented x 3 today.   Head- normocephalic, atraumatic Eyes-  Sclera clear, conjunctiva pink Ears- hearing intact Oropharynx- clear Neck- supple, no JVP Lymph- no cervical lymphadenopathy Lungs- Clear to ausculation bilaterally,  normal work of breathing Heart- Regular paced rate and rhythm, no murmurs, rubs or gallops, PMI not laterally displaced GI- soft, NT, ND, + BS Extremities- no clubbing, cyanosis, or edema MS- no significant deformity or atrophy Skin- no rash or lesion Psych- euthymic mood, full affect Neuro- strength and sensation are intact  EKG-v paced rhythm at at 92 bpm Epic records reviewed Interrogation today reveals persistent afib    Assessment and Plan: 1. Persistent afib for several weeks Continue amiodarone 200 mg daily  Continue xarelto for a chadsvasc score of at least 4, states no missed doses Will be scheduled for cardioversion 11/14, risk vrs benefit of procedure discussed  2, HTN  Stable  3. CHF Weight stable  F/u afib clinic in one week  Butch Penny C. Nahla Lukin, [facility] [address] [place], [place] [address] [phone]

## 2017-08-13 NOTE — Telephone Encounter (Signed)
Received call on CHF clinic triage line reporting patient needing refill on xarelto that was sent in as a request via fax last week. Xarelto e-scribed to [facility] per request.  Renee Pain, RN

## 2017-08-13 NOTE — Progress Notes (Signed)
Primary Care Physician: Dione Housekeeper, MD Referring Physician: Dr. Rayann Heman Cardiologist: Dr. Doreene Eland is a 76 y.o. male with a h/o  paroxysmal atrial fibrillation and chronic systolic CHF thought to be due to nonischemic cardiomyopathy. cardiac MRI showed persistently low EF, so given LBBB, he had Medtronic CRT-D device placed.   Repeat echo in 2/17 showed EF 55-60%.   He is in the afib today, 10/29 at request of device clinic for paceart report showing afib x 10 days to two weeks. Pt keeps his HR and BP at home and his readings show increase in HR( mostly less than 100 ) mid October. He has felt more tired since then. Dr. Jackalyn Lombard  recommendation was to increase amiodarone to 200 mg daily and proceed with  cardioversion in a few weeks.Weight is stable.  Return to afib clinic,11/13, device interrogated and he remains in persistent afib. He also remains more short of breath. He has not missed any doses of xarelto. Will plan  for cardioversion.  Today, he denies symptoms of palpitations, chest pain,  orthopnea, PND, lower extremity edema, dizziness, presyncope, syncope, or neurologic sequela. + for fatigue/shortness of breath. The patient is tolerating medications without difficulties and is otherwise without complaint today.   Past Medical History:  Diagnosis Date  . Adenomatous colon polyp    tubular  . AICD (automatic cardioverter/defibrillator) present 03/08/2015   MDT CRTD   . Anemia    iron deficient  . Arthritis    "about all my joints; hands, knees, back" (03/08/2015)  . Atherosclerosis   . Cataract    left eye small  . Cholelithiasis    gallstones  . Chronic systolic CHF (congestive heart failure) ([place])    a. New dx 12/2012 ? NICM, may be r/t afib. b. Nuc 03/2013 - normal;  c. 03/2015 TEE EF 15-20%.  . Colon polyp, hyperplastic 5/16   removed precancerous lesions  . Depression   . Diverticulosis   . Glaucoma    right eye  . Hyperlipidemia   . Hypertension    . Melanoma of eye ([place]) 2000's   "right; it's never been biopsied"  . Melanoma of lower leg ([place]) 2015   "left; right at my knee"  . Myocardial infarction ([place]) 1998  . OSA (obstructive sleep apnea) 01/04/2016   uses cpap, pt does not know settings auto set  . Peripheral vision loss, right 2006  . Persistent atrial fibrillation ([place])    a. Dx 12/2012, s/p TEE/DCCV 01/26/13. b. On Xarelto (CHA2DS2VASc = 3);  c. 03/2015 TEE (EF 15-20%, no LAA thrombus) and DCCV - amio increased to 200 mg bid.  . Urinary hesitancy due to benign prostatic hypertrophy    Past Surgical History:  Procedure Laterality Date  . BACK SURGERY     upper back, cannot turn neck well  . CARDIAC CATHETERIZATION  1998  . CATARACT EXTRACTION Right ~ 2006  . GLAUCOMA SURGERY Right ~ 2006   "put 3 stents in to drain fluid" (03/08/2015) not successful, sent to [facility] to try to get last stent out  . INCISION AND DRAINAGE ABSCESS POSTERIOR CERVICALSPINE  05/2012  . JOINT REPLACEMENT    . MELANOMA EXCISION Left 2015   "lower leg; right at my knee"  . REFRACTIVE SURGERY Right ~ 2006 X 2   "twice; both done at [place] [facility]" (03/08/2015  . SURGERY SCROTAL / TESTICULAR Right 1990's  . THORACIC SPINE SURGERY  03/2000   "ground calcium deposits from upper thoracic" (01/26/2013)  .  TOTAL HIP ARTHROPLASTY Right 06/2007    Current Outpatient Medications  Medication Sig Dispense Refill  . amiodarone (PACERONE) 200 MG tablet Take 1 tablet (200 mg total) by mouth daily. 15 tablet 9  . carvedilol (COREG) 12.5 MG tablet Take 1 tablet (12.5 mg total) by mouth 2 (two) times daily with a meal. 180 tablet 3  . dorzolamide-timolol (COSOPT) 22.3-6.8 MG/ML ophthalmic solution Place 1 drop into the right eye 2 (two) times daily.    [name] escitalopram (LEXAPRO) 20 MG tablet TAKE 1 TABLET ONCE A DAY 90 tablet 1  . ezetimibe (ZETIA) 10 MG tablet Take 1 tablet (10 mg total) by mouth daily. 90 tablet 3  . finasteride (PROSCAR) 5 MG tablet Take 5 mg by mouth every  morning.     . furosemide (LASIX) 20 MG tablet TAKE (2) TABLETS DAILY 180 tablet 3  . gabapentin (NEURONTIN) 100 MG capsule TAKE 2 TAB BY MOUTH IN THE AM AND 2 TABS BY MOUTH IN THE PM    . LATANOPROST OP Apply 1 drop to eye daily.    [name] losartan (COZAAR) 50 MG tablet TAKE 1 TABLET IN THE MORNING AND 1/2 TABLET IN THE EVENING 135 tablet 2  . Netarsudil Dimesylate (RHOPRESSA OP) Place 1 drop into the right eye daily.    . niacin (NIASPAN) 500 MG CR tablet Take 500 mg by mouth 2 (two) times daily.    [name] Glycol-Propyl Glycol (SYSTANE ULTRA OP) Place 1 drop into the left eye 2 (two) times daily.     . potassium chloride SA (K-DUR,KLOR-CON) 20 MEQ tablet TAKE  (1)  TABLET TWICE A DAY. (Patient taking differently: TAKE  (1)  TABLET ONCE  A DAY.) 180 tablet 1  . rivaroxaban (XARELTO) 20 MG TABS tablet Take 1 tablet (20 mg total) by mouth daily with supper. RESTART 08/11/16 90 tablet 3  . spironolactone (ALDACTONE) 25 MG tablet TAKE (1/2) TABLET DAILY. 45 tablet 3  . tamsulosin (FLOMAX) 0.4 MG CAPS Take 0.4 mg by mouth at bedtime.      No current facility-administered medications for this encounter.     Allergies  Allergen Reactions  . Pravastatin Sodium Other (See Comments)    Joint and muscle pain  . Zithromax [Azithromycin] Diarrhea    Social History   Socioeconomic History  . Marital status: Married    Spouse name: Not on file  . Number of children: 3  . Years of education: Not on file  . Highest education level: Not on file  Social Needs  . Financial resource strain: Not on file  . Food insecurity - worry: Not on file  . Food insecurity - inability: Not on file  . Transportation needs - medical: Not on file  . Transportation needs - non-medical: Not on file  Occupational History  . Occupation: retired  Tobacco Use  . Smoking status: Former Smoker    Packs/day: 3.00    Years: 48.00    Pack years: 144.00    Types: Cigarettes    Last attempt to quit: 09/28/2000    Years  since quitting: 16.8  . Smokeless tobacco: Never Used  Substance and Sexual Activity  . Alcohol use: No  . Drug use: No  . Sexual activity: Yes  Other Topics Concern  . Not on file  Social History Narrative   Pt lives in [place] with spouse.  3 children are grown and healthy.   Retired.  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 Kidney disease Mother   . Heart disease Mother        MI, open heart  . Diabetes Mother        dialysis  . Leukemia Father   . Colon cancer Paternal Uncle   . Lung cancer Paternal Uncle        x 2  . Prostate cancer Paternal Uncle   . Diabetes Maternal Grandmother   . Heart attack Maternal Uncle   . Diabetes Maternal Aunt        x 3  . Diabetes Maternal Uncle     ROS- All systems are reviewed and negative except as per the HPI above  Physical Exam: Vitals:   08/13/17 0855  BP: 114/82  Pulse: 92  Weight: 230 lb 12.8 oz (104.7 kg)  Height: 5\' 6"  (1.676 m)   Wt Readings from Last 3 Encounters:  08/13/17 230 lb 12.8 oz (104.7 kg)  07/29/17 231 lb 8 oz (105 kg)  07/26/17 230 lb 9.6 oz (104.6 kg)    Labs: Lab Results  Component Value Date   NA 139 07/26/2017   K 4.0 07/26/2017   CL 103 07/26/2017   CO2 29 07/26/2017   GLUCOSE 123 (H) 07/26/2017   BUN 11 07/26/2017   CREATININE 1.36 (H) 07/26/2017   CALCIUM 8.7 (L) 07/26/2017   MG 2.3 10/08/2014   Lab Results  Component Value Date   INR 1.69 (H) 12/16/2014   Lab Results  Component Value Date   CHOL 200 03/22/2015   HDL 26 (L) 03/22/2015   LDLCALC 153 (H) 03/22/2015   TRIG 105 03/22/2015     GEN- The patient is well appearing, alert and oriented x 3 today.   Head- normocephalic, atraumatic Eyes-  Sclera clear, conjunctiva pink Ears- hearing intact Oropharynx- clear Neck- supple, no JVP Lymph- no cervical lymphadenopathy Lungs- Clear to ausculation bilaterally,  normal work of breathing Heart- Regular paced rate and rhythm, no murmurs, rubs or gallops, PMI not laterally displaced GI- soft, NT, ND, + BS Extremities- no clubbing, cyanosis, or edema MS- no significant deformity or atrophy Skin- no rash or lesion Psych- euthymic mood, full affect Neuro- strength and sensation are intact  EKG-v paced rhythm at at 92 bpm Epic records reviewed Interrogation today reveals persistent afib    Assessment and Plan: 1. Persistent afib for several weeks Continue amiodarone 200 mg daily  Continue xarelto for a chadsvasc score of at least 4, states no missed doses Will be scheduled for cardioversion 11/14, risk vrs benefit of procedure discussed  2, HTN  Stable  3. CHF Weight stable  F/u afib clinic in one week  Butch Penny C. Brynlee Pennywell, [facility] [address] [place], [place] [address] [phone]

## 2017-08-13 NOTE — Patient Instructions (Addendum)
Your cardioversion is scheduled for : 08/14/2017 at 11 a.m. Arrive at the [facility] and go to admitting at 9:30 Do Not eat or drink anything after midnight the night prior to your procedure. Take all your medications with a sip of water prior to arrival. Do NOT miss any doses of your blood thinner. You will NOT be able to drive home after your procedure.   Follow up with afib clinic in 1 week from procedure.  This has been scheduled.

## 2017-08-14 ENCOUNTER — Ambulatory Visit (HOSPITAL_COMMUNITY): Payer: Medicare Other | Admitting: Anesthesiology

## 2017-08-14 ENCOUNTER — Ambulatory Visit (HOSPITAL_COMMUNITY)
Admission: RE | Admit: 2017-08-14 | Discharge: 2017-08-14 | Disposition: A | Discharge status: Home / Self Care | Payer: Medicare Other | Source: Ambulatory Visit | Attending: Cardiovascular Disease | Admitting: Cardiovascular Disease

## 2017-08-14 ENCOUNTER — Encounter (HOSPITAL_COMMUNITY)
Admission: RE | Disposition: A | Discharge status: Home / Self Care | Payer: Self-pay | Source: Ambulatory Visit | Attending: Cardiovascular Disease

## 2017-08-14 ENCOUNTER — Encounter (HOSPITAL_COMMUNITY): Payer: Self-pay

## 2017-08-14 DIAGNOSIS — I481 Persistent atrial fibrillation: Secondary | ICD-10-CM

## 2017-08-14 DIAGNOSIS — Z7901 Long term (current) use of anticoagulants: Secondary | ICD-10-CM | POA: Insufficient documentation

## 2017-08-14 DIAGNOSIS — Z95810 Presence of automatic (implantable) cardiac defibrillator: Secondary | ICD-10-CM | POA: Insufficient documentation

## 2017-08-14 DIAGNOSIS — I480 Paroxysmal atrial fibrillation: Secondary | ICD-10-CM | POA: Diagnosis not present

## 2017-08-14 DIAGNOSIS — G4733 Obstructive sleep apnea (adult) (pediatric): Secondary | ICD-10-CM | POA: Insufficient documentation

## 2017-08-14 DIAGNOSIS — I429 Cardiomyopathy, unspecified: Secondary | ICD-10-CM | POA: Diagnosis not present

## 2017-08-14 DIAGNOSIS — I252 Old myocardial infarction: Secondary | ICD-10-CM | POA: Insufficient documentation

## 2017-08-14 DIAGNOSIS — E785 Hyperlipidemia, unspecified: Secondary | ICD-10-CM | POA: Insufficient documentation

## 2017-08-14 DIAGNOSIS — I110 Hypertensive heart disease with heart failure: Secondary | ICD-10-CM | POA: Diagnosis not present

## 2017-08-14 DIAGNOSIS — I5022 Chronic systolic (congestive) heart failure: Secondary | ICD-10-CM | POA: Insufficient documentation

## 2017-08-14 DIAGNOSIS — F329 Major depressive disorder, single episode, unspecified: Secondary | ICD-10-CM | POA: Insufficient documentation

## 2017-08-14 DIAGNOSIS — Z79899 Other long term (current) drug therapy: Secondary | ICD-10-CM | POA: Insufficient documentation

## 2017-08-14 HISTORY — PX: CARDIOVERSION: SHX1299

## 2017-08-14 SURGERY — CARDIOVERSION
Anesthesia: General

## 2017-08-14 MED ORDER — LIDOCAINE 2% (20 MG/ML) 5 ML SYRINGE
INTRAMUSCULAR | Status: DC | PRN
  Administered 2017-08-14: 50 mg via INTRAVENOUS

## 2017-08-14 MED ORDER — SODIUM CHLORIDE 0.9 % IV SOLN
INTRAVENOUS | Status: DC
  Administered 2017-08-14: 500 mL via INTRAVENOUS
  Administered 2017-08-14: 10:00:00 via INTRAVENOUS

## 2017-08-14 MED ORDER — PROPOFOL 10 MG/ML IV BOLUS
INTRAVENOUS | Status: DC | PRN
  Administered 2017-08-14: 50 mg via INTRAVENOUS

## 2017-08-14 NOTE — Anesthesia Postprocedure Evaluation (Signed)
Anesthesia Post Note  Patient: Chad Brown  Procedure(s) Performed: CARDIOVERSION (N/A )     Patient location during evaluation: PACU Anesthesia Type: General Level of consciousness: awake and alert and oriented Pain management: pain level controlled Vital Signs Assessment: post-procedure vital signs reviewed and stable Respiratory status: spontaneous breathing, nonlabored ventilation and respiratory function stable Cardiovascular status: blood pressure returned to baseline and stable Postop Assessment: no apparent nausea or vomiting Anesthetic complications: no    Last Vitals:  Vitals:   08/14/17 0917 08/14/17 1023  BP: 131/82 126/74  Pulse: 85 61  Resp: 16 12  Temp: 36.4 C 36.7 C  SpO2: 99% 98%    Last Pain:  Vitals:   08/14/17 1023  TempSrc: Oral                 Shayleen Eppinger A.

## 2017-08-14 NOTE — Transfer of Care (Signed)
Immediate Anesthesia Transfer of Care Note  Patient: Chad Brown  Procedure(s) Performed: CARDIOVERSION (N/A )  Patient Location: Endoscopy Unit  Anesthesia Type:General  Level of Consciousness: awake and alert   Airway & Oxygen Therapy: Patient Spontanous Breathing and Patient connected to nasal cannula oxygen  Post-op Assessment: Report given to RN and Post -op Vital signs reviewed and stable  Post vital signs: Reviewed and stable  Last Vitals:  Vitals:   08/14/17 0917  BP: 131/82  Pulse: 85  Resp: 16  Temp: 36.4 C  SpO2: 99%    Last Pain:  Vitals:   08/14/17 0917  TempSrc: Oral         Complications: No apparent anesthesia complications

## 2017-08-14 NOTE — Anesthesia Procedure Notes (Signed)
Date/Time: 08/14/2017 10:08 AM Performed by: Wilburn Cornelia, CRNA Pre-anesthesia Checklist: Patient identified, Emergency Drugs available, Suction available, Patient being monitored and Timeout performed Patient Re-evaluated:Patient Re-evaluated prior to induction Oxygen Delivery Method: Ambu bag Preoxygenation: Pre-oxygenation with 100% oxygen Induction Type: IV induction Placement Confirmation: positive ETCO2 and breath sounds checked- equal and bilateral Dental Injury: Teeth and Oropharynx as per pre-operative assessment

## 2017-08-14 NOTE — CV Procedure (Signed)
[place] No missed doses NOAC Propofol  Medtronic present device shows afib Shock x 1 120 J biphasic converted to NSR No immediate neurologic sequelae  Device confirms restoration of sinus Programmed DDDR with mode switch to [place] For afib  [employer]

## 2017-08-14 NOTE — Interval H&P Note (Signed)
History and Physical Interval Note:  08/14/2017 10:31 AM  Chad Brown  has presented today for surgery, with the diagnosis of AFIB  The various methods of treatment have been discussed with the patient and family. After consideration of risks, benefits and other options for treatment, the patient has consented to  Procedure(s): CARDIOVERSION (N/A) as a surgical intervention .  The patient's history has been reviewed, patient examined, no change in status, stable for surgery.  I have reviewed the patient's chart and labs.  Questions were answered to the patient's satisfaction.     [name]

## 2017-08-14 NOTE — Anesthesia Preprocedure Evaluation (Addendum)
Anesthesia Evaluation  Patient identified by MRN, date of birth, ID band Patient awake    Reviewed: Allergy & Precautions, NPO status , Patient's Chart, lab work & pertinent test results, reviewed documented beta blocker date and time   Airway Mallampati: II  TM Distance: >3 FB Neck ROM: Full    Dental  (+) Lower Dentures, Upper Dentures   Pulmonary sleep apnea and Continuous Positive Airway Pressure Ventilation , former smoker,    Pulmonary exam normal breath sounds clear to auscultation       Cardiovascular hypertension, Pt. on medications and Pt. on home beta blockers + Past MI and +CHF  Normal cardiovascular exam+ dysrhythmias Atrial Fibrillation + Cardiac Defibrillator  Rhythm:Irregular Rate:Normal  Cardiomyopathy LVEF 30-35% Diffuse hypokinesis   Neuro/Psych PSYCHIATRIC DISORDERS Depression negative neurological ROS     GI/Hepatic negative GI ROS, Neg liver ROS,   Endo/Other  Hyperlipidemia  Renal/GU negative Renal ROS  negative genitourinary   Musculoskeletal  (+) Arthritis , Osteoarthritis,    Abdominal (+) + obese,   Peds  Hematology  (+) anemia , Thrombocytopenia Xarelto- last dose this am   Anesthesia Other Findings   Reproductive/Obstetrics                            Anesthesia Physical Anesthesia Plan  ASA: IV  Anesthesia Plan: General   Post-op Pain Management:    Induction: Intravenous  PONV Risk Score and Plan: 2 and Propofol infusion, Ondansetron and Treatment may vary due to age or medical condition  Airway Management Planned: Mask and Natural Airway  Additional Equipment:   Intra-op Plan:   Post-operative Plan:   Informed Consent: I have reviewed the patients History and Physical, chart, labs and discussed the procedure including the risks, benefits and alternatives for the proposed anesthesia with the patient or authorized representative who has indicated  his/her understanding and acceptance.     Plan Discussed with: CRNA, Anesthesiologist and Surgeon  Anesthesia Plan Comments:         Anesthesia Quick Evaluation

## 2017-08-14 NOTE — Discharge Instructions (Signed)
Electrical Cardioversion, Care After °This sheet gives you information about how to care for yourself after your procedure. Your health care provider may also give you more specific instructions. If you have problems or questions, contact your health care provider. °What can I expect after the procedure? °After the procedure, it is common to have: °· Some redness on the skin where the shocks were given. ° °Follow these instructions at home: °· Do not drive for 24 hours if you were given a medicine to help you relax (sedative). °· Take over-the-counter and prescription medicines only as told by your health care provider. °· Ask your health care provider how to check your pulse. Check it often. °· Rest for 48 hours after the procedure or as told by your health care provider. °· Avoid or limit your caffeine use as told by your health care provider. °Contact a health care provider if: °· You feel like your heart is beating too quickly or your pulse is not regular. °· You have a serious muscle cramp that does not go away. °Get help right away if: °· You have discomfort in your chest. °· You are dizzy or you feel faint. °· You have trouble breathing or you are short of breath. °· Your speech is slurred. °· You have trouble moving an arm or leg on one side of your body. °· Your fingers or toes turn cold or blue. °This information is not intended to replace advice given to you by your health care provider. Make sure you discuss any questions you have with your health care provider. °Document Released: 07/08/2013 Document Revised: 04/20/2016 Document Reviewed: 03/23/2016 °Elsevier Interactive Patient Education © 2018 [employer] Inc[employer] ° °

## 2017-08-15 ENCOUNTER — Encounter (HOSPITAL_COMMUNITY): Payer: Self-pay | Admitting: Cardiovascular Disease

## 2017-08-19 ENCOUNTER — Ambulatory Visit (INDEPENDENT_AMBULATORY_CARE_PROVIDER_SITE_OTHER): Payer: Medicare Other | Admitting: *Deleted

## 2017-08-19 DIAGNOSIS — Z95810 Presence of automatic (implantable) cardiac defibrillator: Secondary | ICD-10-CM

## 2017-08-19 DIAGNOSIS — I5022 Chronic systolic (congestive) heart failure: Secondary | ICD-10-CM | POA: Diagnosis not present

## 2017-08-19 DIAGNOSIS — I428 Other cardiomyopathies: Secondary | ICD-10-CM

## 2017-08-19 NOTE — Progress Notes (Signed)
EPIC Encounter for ICM Monitoring  Patient Name: Chad Brown is a 76 y.o. male Date: 08/19/2017 Primary Care Physican: Dione Housekeeper, MD Primary Cardiologist:Nishan/McLean Electrophysiologist: Allred Dry Weight:223lbs  Bi-V Pacing: 97.7%      Heart Failure questions reviewed, pt asymptomatic.  He said he feels much better since he had the cardioversion   Thoracic impedance normal.   Cardioversion on 08/14/2017 and appears has not had any Afib since that time.  Prescribed dosage: Furosemide 20 mg 2 tablets (40 mg total) daily. Potassium 20 mEq take 1 tablet daily  Labs: 08/13/2017 Creatinine 1.34, BUN 11, Potassium 4.5, Sodium 136, EGFR 50-58 07/26/2017 Creatinine 1.36, BUN 11, Potassium 4.0, Sodium 139, EGFR 01/24/2017 Creatinine 1.46, BUN 13, Potassium 4.1, Sodium 140, EGFR 45-52 11/21/2016 Creatinine 1.40, BUN 15, Potassium 4.0, Sodium 138, EGFR 48-55 10/25/2017Creatinine 1.29, BUN 31, Potassium 4.2, Sodium 136, EGFR 53->60 05/21/2016 Creatinine 1.24, BUN 16, Potassium 4.2, Sodium 138, EGFR 55->60  03/19/2016 Creatinine 1.09, BUN 15, Potassium 4.6, Sodium 137, EGFR >60  01/19/2016 Creatinine 1.15, BUN 10, Potassium 4.2, Sodium 143, EGFR >60  11/04/2015 Creatinine 1.23, BUN 14, Potassium 4.0, Sodium 142, EGFR 56->60  Recommendations: No changes.  Encouraged to call for fluid symptoms.  Follow-up plan: ICM clinic phone appointment on 09/19/2017.   Copy of ICM check sent to Dr. Rayann Heman and Dr. Johnsie Cancel.   3 month ICM trend: 08/19/2017    AT/AF   1 Year ICM trend:       Rosalene Billings, RN 08/19/2017 1:57 PM

## 2017-08-19 NOTE — Progress Notes (Signed)
Remote ICD transmission.   

## 2017-08-20 LAB — CUP PACEART REMOTE DEVICE CHECK
Implantable Lead Implant Date: 20160607
Implantable Lead Implant Date: 20160607
Implantable Lead Location: 753858
Implantable Lead Model: 4598
Implantable Pulse Generator Implant Date: 20160607
MDC IDC LEAD IMPLANT DT: 20160607
MDC IDC LEAD LOCATION: 753859
MDC IDC LEAD LOCATION: 753860
MDC IDC SESS DTM: 20181120110111

## 2017-08-21 ENCOUNTER — Other Ambulatory Visit: Payer: Self-pay

## 2017-08-21 ENCOUNTER — Encounter (HOSPITAL_COMMUNITY): Payer: Self-pay | Admitting: Nurse Practitioner

## 2017-08-21 ENCOUNTER — Ambulatory Visit (HOSPITAL_COMMUNITY)
Admission: RE | Admit: 2017-08-21 | Discharge: 2017-08-21 | Disposition: A | Discharge status: Home / Self Care | Payer: Medicare Other | Source: Ambulatory Visit | Attending: Nurse Practitioner | Admitting: Nurse Practitioner

## 2017-08-21 VITALS — BP 118/76 | HR 79 | Ht 66.0 in | Wt 228.0 lb

## 2017-08-21 DIAGNOSIS — I5022 Chronic systolic (congestive) heart failure: Secondary | ICD-10-CM | POA: Insufficient documentation

## 2017-08-21 DIAGNOSIS — Z8042 Family history of malignant neoplasm of prostate: Secondary | ICD-10-CM | POA: Insufficient documentation

## 2017-08-21 DIAGNOSIS — Z96641 Presence of right artificial hip joint: Secondary | ICD-10-CM | POA: Diagnosis not present

## 2017-08-21 DIAGNOSIS — I480 Paroxysmal atrial fibrillation: Secondary | ICD-10-CM | POA: Insufficient documentation

## 2017-08-21 DIAGNOSIS — Z95810 Presence of automatic (implantable) cardiac defibrillator: Secondary | ICD-10-CM | POA: Diagnosis not present

## 2017-08-21 DIAGNOSIS — Z85840 Personal history of malignant neoplasm of eye: Secondary | ICD-10-CM | POA: Diagnosis not present

## 2017-08-21 DIAGNOSIS — Z79899 Other long term (current) drug therapy: Secondary | ICD-10-CM | POA: Diagnosis not present

## 2017-08-21 DIAGNOSIS — Z888 Allergy status to other drugs, medicaments and biological substances status: Secondary | ICD-10-CM | POA: Insufficient documentation

## 2017-08-21 DIAGNOSIS — Z841 Family history of disorders of kidney and ureter: Secondary | ICD-10-CM | POA: Insufficient documentation

## 2017-08-21 DIAGNOSIS — Z85820 Personal history of malignant melanoma of skin: Secondary | ICD-10-CM | POA: Diagnosis not present

## 2017-08-21 DIAGNOSIS — H409 Unspecified glaucoma: Secondary | ICD-10-CM | POA: Diagnosis not present

## 2017-08-21 DIAGNOSIS — Z801 Family history of malignant neoplasm of trachea, bronchus and lung: Secondary | ICD-10-CM | POA: Insufficient documentation

## 2017-08-21 DIAGNOSIS — Z87891 Personal history of nicotine dependence: Secondary | ICD-10-CM | POA: Diagnosis not present

## 2017-08-21 DIAGNOSIS — Z86010 Personal history of colonic polyps: Secondary | ICD-10-CM | POA: Diagnosis not present

## 2017-08-21 DIAGNOSIS — G4733 Obstructive sleep apnea (adult) (pediatric): Secondary | ICD-10-CM | POA: Diagnosis not present

## 2017-08-21 DIAGNOSIS — Z9889 Other specified postprocedural states: Secondary | ICD-10-CM | POA: Diagnosis not present

## 2017-08-21 DIAGNOSIS — Z833 Family history of diabetes mellitus: Secondary | ICD-10-CM | POA: Insufficient documentation

## 2017-08-21 DIAGNOSIS — I252 Old myocardial infarction: Secondary | ICD-10-CM | POA: Diagnosis not present

## 2017-08-21 DIAGNOSIS — Z7901 Long term (current) use of anticoagulants: Secondary | ICD-10-CM | POA: Insufficient documentation

## 2017-08-21 DIAGNOSIS — Z881 Allergy status to other antibiotic agents status: Secondary | ICD-10-CM | POA: Insufficient documentation

## 2017-08-21 DIAGNOSIS — Z8249 Family history of ischemic heart disease and other diseases of the circulatory system: Secondary | ICD-10-CM | POA: Insufficient documentation

## 2017-08-21 DIAGNOSIS — D509 Iron deficiency anemia, unspecified: Secondary | ICD-10-CM | POA: Diagnosis not present

## 2017-08-21 DIAGNOSIS — E785 Hyperlipidemia, unspecified: Secondary | ICD-10-CM | POA: Insufficient documentation

## 2017-08-21 DIAGNOSIS — I429 Cardiomyopathy, unspecified: Secondary | ICD-10-CM | POA: Insufficient documentation

## 2017-08-21 DIAGNOSIS — I110 Hypertensive heart disease with heart failure: Secondary | ICD-10-CM | POA: Diagnosis not present

## 2017-08-21 DIAGNOSIS — I4819 Other persistent atrial fibrillation: Secondary | ICD-10-CM

## 2017-08-21 DIAGNOSIS — I481 Persistent atrial fibrillation: Secondary | ICD-10-CM | POA: Diagnosis not present

## 2017-08-21 DIAGNOSIS — Z806 Family history of leukemia: Secondary | ICD-10-CM | POA: Insufficient documentation

## 2017-08-21 DIAGNOSIS — Z9841 Cataract extraction status, right eye: Secondary | ICD-10-CM | POA: Diagnosis not present

## 2017-08-21 DIAGNOSIS — Z800 Family history of malignant neoplasm of digestive organs: Secondary | ICD-10-CM | POA: Insufficient documentation

## 2017-08-21 DIAGNOSIS — M1990 Unspecified osteoarthritis, unspecified site: Secondary | ICD-10-CM | POA: Insufficient documentation

## 2017-08-21 LAB — TSH: TSH: 1.746 u[IU]/mL (ref 0.350–4.500)

## 2017-08-21 NOTE — Progress Notes (Signed)
Primary Care Physician: Dione Housekeeper, MD Referring Physician: Dr. Rayann Heman Cardiologist: Dr. Doreene Eland is a 76 y.o. male with a h/o  paroxysmal atrial fibrillation and chronic systolic CHF thought to be due to nonischemic cardiomyopathy. cardiac MRI showed persistently low EF, so given LBBB, he had Medtronic CRT-D device placed.   Repeat echo in 2/17 showed EF 55-60%.   He is in the afib today, 10/29 at request of device clinic for paceart report showing afib x 10 days to two weeks. Pt keeps his HR and BP at home and his readings show increase in HR( mostly less than 100 ) mid October. He has felt more tired since then. Dr. Jackalyn Lombard  recommendation was to increase amiodarone to 200 mg daily and proceed with  cardioversion in a few weeks.Weight is stable.  Return to afib clinic,11/13, device interrogated and he remains in persistent afib. He also remains more short of breath. He has not missed any doses of xarelto. Will plan  for cardioversion.  F/u cardioversion 11/21, he appears to be in SR, had device check yesterday and afib was not seen. He will continue with amiodarone at 200 mg a day.He is A/V paced today. He feels improved.  Today, he denies symptoms of palpitations, chest pain,  orthopnea, PND, lower extremity edema, dizziness, presyncope, syncope, or neurologic sequela. + for fatigue/shortness of breath. The patient is tolerating medications without difficulties and is otherwise without complaint today.   Past Medical History:  Diagnosis Date  . Adenomatous colon polyp    tubular  . AICD (automatic cardioverter/defibrillator) present 03/08/2015   MDT CRTD   . Anemia    iron deficient  . Arthritis    "about all my joints; hands, knees, back" (03/08/2015)  . Atherosclerosis   . Cataract    left eye small  . Cholelithiasis    gallstones  . Chronic systolic CHF (congestive heart failure) ([place])    a. New dx 12/2012 ? NICM, may be r/t afib. b. Nuc 03/2013 - normal;   c. 03/2015 TEE EF 15-20%.  . Colon polyp, hyperplastic 5/16   removed precancerous lesions  . Depression   . Diverticulosis   . Glaucoma    right eye  . Hyperlipidemia   . Hypertension   . Melanoma of eye ([place]) 2000's   "right; it's never been biopsied"  . Melanoma of lower leg ([place]) 2015   "left; right at my knee"  . Myocardial infarction ([place]) 1998  . OSA (obstructive sleep apnea) 01/04/2016   uses cpap, pt does not know settings auto set  . Peripheral vision loss, right 2006  . Persistent atrial fibrillation ([place])    a. Dx 12/2012, s/p TEE/DCCV 01/26/13. b. On Xarelto (CHA2DS2VASc = 3);  c. 03/2015 TEE (EF 15-20%, no LAA thrombus) and DCCV - amio increased to 200 mg bid.  . Urinary hesitancy due to benign prostatic hypertrophy    Past Surgical History:  Procedure Laterality Date  . BACK SURGERY     upper back, cannot turn neck well  . CARDIAC CATHETERIZATION  1998  . CARDIOVERSION N/A 01/26/2013   Procedure: CARDIOVERSION;  Surgeon: Lelon Perla, MD;  Location: [facility] ENDOSCOPY;  Service: Cardiovascular;  Laterality: N/A;  . CARDIOVERSION N/A 03/23/2015   Procedure: CARDIOVERSION;  Surgeon: Jerline Pain, MD;  Location: [facility] ENDOSCOPY;  Service: Cardiovascular;  Laterality: N/A;  . CARDIOVERSION N/A 08/14/2017   Procedure: CARDIOVERSION;  Surgeon: Josue Hector, MD;  Location: [place];  Service:  Cardiovascular;  Laterality: N/A;  . CATARACT EXTRACTION Right ~ 2006  . COLONOSCOPY WITH PROPOFOL N/A 02/10/2015   Procedure: COLONOSCOPY WITH PROPOFOL;  Surgeon: Gatha Mayer, MD;  Location: [facility] ENDOSCOPY;  Service: Endoscopy;  Laterality: N/A;  . COLONOSCOPY WITH PROPOFOL N/A 08/07/2016   Procedure: COLONOSCOPY WITH PROPOFOL;  Surgeon: Gatha Mayer, MD;  Location: [facility] ENDOSCOPY;  Service: Endoscopy;  Laterality: N/A;  . ENTEROSCOPY N/A 08/17/2015   Procedure: ENTEROSCOPY;  Surgeon: Gatha Mayer, MD;  Location: [facility] ENDOSCOPY;  Service: Endoscopy;  Laterality: N/A;  . EP IMPLANTABLE  DEVICE N/A 03/08/2015   MDT Hillery Aldo CRT-D for nonischemic CM by Dr Rayann Heman for primary prevention  . GLAUCOMA SURGERY Right ~ 2006   "put 3 stents in to drain fluid" (03/08/2015) not successful, sent to [facility] to try to get last stent out  . HOT HEMOSTASIS N/A 08/07/2016   Procedure: HOT HEMOSTASIS (ARGON PLASMA COAGULATION/BICAP);  Surgeon: Gatha Mayer, MD;  Location: [facility] ENDOSCOPY;  Service: Endoscopy;  Laterality: N/A;  . INCISION AND DRAINAGE ABSCESS POSTERIOR CERVICALSPINE  05/2012  . JOINT REPLACEMENT    . MELANOMA EXCISION Left 2015   "lower leg; right at my knee"  . REFRACTIVE SURGERY Right ~ 2006 X 2   "twice; both done at [facility]" (03/08/2015  . SURGERY SCROTAL / TESTICULAR Right 1990's  . TEE WITHOUT CARDIOVERSION N/A 01/26/2013   Procedure: TRANSESOPHAGEAL ECHOCARDIOGRAM (TEE);  Surgeon: Lelon Perla, MD;  Location: [place];  Service: Cardiovascular;  Laterality: N/A;  [name] anes. /   . TEE WITHOUT CARDIOVERSION N/A 10/05/2014   Procedure: TRANSESOPHAGEAL ECHOCARDIOGRAM (TEE)  with cardioversion;  Surgeon: Thayer Headings, MD;  Location: [facility] ENDOSCOPY;  Service: Cardiovascular;  Laterality: N/A;  12:52 synched cardioversion at 120 joules,...afib to SR...12 lead EKG ordered.Marland KitchenMarland KitchenCardiozem d/c'ed per MD verbal order at SR  . TEE WITHOUT CARDIOVERSION N/A 03/23/2015   Procedure: TRANSESOPHAGEAL ECHOCARDIOGRAM (TEE);  Surgeon: Jerline Pain, MD;  Location: [facility] ENDOSCOPY;  Service: Cardiovascular;  Laterality: N/A;  . THORACIC SPINE SURGERY  03/2000   "ground calcium deposits from upper thoracic" (01/26/2013)  . TOTAL HIP ARTHROPLASTY Right 06/2007    Current Outpatient Medications  Medication Sig Dispense Refill  . amiodarone (PACERONE) 200 MG tablet Take 1 tablet (200 mg total) by mouth daily. 15 tablet 9  . carvedilol (COREG) 12.5 MG tablet Take 1 tablet (12.5 mg total) by mouth 2 (two) times daily with a meal. 180 tablet 3  . Coenzyme Q10 (COQ10 GUMMIES ADULT PO) Take 1 tablet at bedtime by  mouth.    . dorzolamide-timolol (COSOPT) 22.3-6.8 MG/ML ophthalmic solution Place 1 drop into the right eye 2 (two) times daily.    [name] escitalopram (LEXAPRO) 20 MG tablet TAKE 1 TABLET ONCE A DAY (Patient taking differently: TAKE 1 TABLET ONCE A DAY IN THE AFTERNOON (1400-1500)) 90 tablet 1  . ezetimibe (ZETIA) 10 MG tablet Take 1 tablet (10 mg total) by mouth daily. (Patient taking differently: Take 10 mg at bedtime by mouth. ) 90 tablet 3  . ferrous sulfate 325 (65 FE) MG tablet Take 325 mg every other day by mouth.    . finasteride (PROSCAR) 5 MG tablet Take 5 mg daily by mouth.     . furosemide (LASIX) 20 MG tablet TAKE (2) TABLETS DAILY (Patient taking differently: TAKE 2 TABLETS (40 MG) BY MOUTH ONCE DAILY IN THE MORNING.) 180 tablet 3  . gabapentin (NEURONTIN) 100 MG capsule Take 200 mg 2 (two) times daily by  mouth. TAKE 2 TAB BY MOUTH IN THE AM AND 2 TABS BY MOUTH IN THE PM    . latanoprost (XALATAN) 0.005 % ophthalmic solution Place 1 drop at bedtime into both eyes.    [name] losartan (COZAAR) 50 MG tablet TAKE 1 TABLET IN THE MORNING AND 1/2 TABLET IN THE EVENING (Patient taking differently: TAKE 1 TABLET (50 MG) IN THE MORNING AND 1/2 TABLET (25 MG) IN THE EVENING) 135 tablet 2  . Netarsudil Dimesylate (RHOPRESSA) 0.02 % SOLN Place 1 drop daily into both eyes.    . niacin (NIASPAN) 500 MG CR tablet Take 1,000 mg at bedtime by mouth.     [name] Glycol-Propyl Glycol (SYSTANE ULTRA OP) Place 1 drop into the left eye 2 (two) times daily.     . potassium chloride SA (K-DUR,KLOR-CON) 20 MEQ tablet TAKE  (1)  TABLET TWICE A DAY. (Patient taking differently: TAKE  (1)  TABLET ONCE  A DAILY IN THE MORNING.) 180 tablet 1  . rivaroxaban (XARELTO) 20 MG TABS tablet Take 1 tablet (20 mg total) daily with supper by mouth. RESTART 08/11/16 90 tablet 3  . spironolactone (ALDACTONE) 25 MG tablet TAKE (1/2) TABLET DAILY. (Patient taking differently: TAKE 0.5 TABLET (12.5 MG) BY MOUTH DAILY IN THE MORNING.) 45  tablet 3  . tamsulosin (FLOMAX) 0.4 MG CAPS Take 0.4 mg by mouth at bedtime.      No current facility-administered medications for this encounter.     Allergies  Allergen Reactions  . Pravastatin Sodium Other (See Comments)    Joint and muscle pain  . Zithromax [Azithromycin] Diarrhea    Social History   Socioeconomic History  . Marital status: Married    Spouse name: Not on file  . Number of children: 3  . Years of education: Not on file  . Highest education level: Not on file  Social Needs  . Financial resource strain: Not on file  . Food insecurity - worry: Not on file  . Food insecurity - inability: Not on file  . Transportation needs - medical: Not on file  . Transportation needs - non-medical: Not on file  Occupational History  . Occupation: retired  Tobacco Use  . Smoking status: Former Smoker    Packs/day: 3.00    Years: 48.00    Pack years: 144.00    Types: Cigarettes    Last attempt to quit: 09/28/2000    Years since quitting: 16.9  . Smokeless tobacco: Never Used  Substance and Sexual Activity  . Alcohol use: No  . Drug use: No  . Sexual activity: Yes  Other Topics Concern  . Not on file  Social History Narrative   Pt lives in [place] with spouse.  3 children are grown and healthy.   Retired.  Ran a country store for 30 years, previously worked in the [employer] for 16 years    Family History  Problem Relation Age of Onset  . Kidney disease Mother   . Heart disease Mother        MI, open heart  . Diabetes Mother        dialysis  . Leukemia Father   . Colon cancer Paternal Uncle   . Lung cancer Paternal Uncle        x 2  . Prostate cancer Paternal Uncle   . Diabetes Maternal Grandmother   . Heart attack Maternal Uncle   . Diabetes Maternal Aunt        x 3  .  Diabetes Maternal Uncle     ROS- All systems are reviewed and negative except as per the HPI above  Physical Exam: Vitals:   08/21/17 0911  BP: 118/76  Pulse: 79    Weight: 228 lb (103.4 kg)  Height: 5\' 6"  (1.676 m)   Wt Readings from Last 3 Encounters:  08/21/17 228 lb (103.4 kg)  08/14/17 223 lb 6 oz (101.3 kg)  08/13/17 230 lb 12.8 oz (104.7 kg)    Labs: Lab Results  Component Value Date   NA 136 08/13/2017   K 4.5 08/13/2017   CL 104 08/13/2017   CO2 27 08/13/2017   GLUCOSE 117 (H) 08/13/2017   BUN 11 08/13/2017   CREATININE 1.34 (H) 08/13/2017   CALCIUM 8.2 (L) 08/13/2017   MG 2.3 10/08/2014   Lab Results  Component Value Date   INR 1.69 (H) 12/16/2014   Lab Results  Component Value Date   CHOL 200 03/22/2015   HDL 26 (L) 03/22/2015   LDLCALC 153 (H) 03/22/2015   TRIG 105 03/22/2015     GEN- The patient is well appearing, alert and oriented x 3 today.   Head- normocephalic, atraumatic Eyes-  Sclera clear, conjunctiva pink Ears- hearing intact Oropharynx- clear Neck- supple, no JVP Lymph- no cervical lymphadenopathy Lungs- Clear to ausculation bilaterally, normal work of breathing Heart- Regular paced rate and rhythm, no murmurs, rubs or gallops, PMI not laterally displaced GI- soft, NT, ND, + BS Extremities- no clubbing, cyanosis, or edema MS- no significant deformity or atrophy Skin- no rash or lesion Psych- euthymic mood, full affect Neuro- strength and sensation are intact  EKG-av paced rhythm at at 79 bpm Epic records reviewed Interrogation today reveals persistent afib    Assessment and Plan: 1. Persistent afib for several weeks Successful cardioversion 11/14 Continue amiodarone 200 mg daily  Continue xarelto for a chadsvasc score of at least 4, reminded not to miss doses 30 days after cardioversion Liver enzymes recently checked and normal, will check TSH today  2, HTN  Stable  3. CHF Weight stable  F/u with device clinic/ Dr. Rayann Heman and Dr. Johnsie Cancel as scheducled  Geroge Baseman. [place], [facility] [address] [place], [place] [address] [phone]

## 2017-08-29 ENCOUNTER — Encounter: Payer: Self-pay | Admitting: Cardiology

## 2017-09-19 ENCOUNTER — Ambulatory Visit (INDEPENDENT_AMBULATORY_CARE_PROVIDER_SITE_OTHER): Payer: Medicare Other

## 2017-09-19 DIAGNOSIS — Z95810 Presence of automatic (implantable) cardiac defibrillator: Secondary | ICD-10-CM | POA: Diagnosis not present

## 2017-09-19 DIAGNOSIS — I5022 Chronic systolic (congestive) heart failure: Secondary | ICD-10-CM

## 2017-09-20 NOTE — Progress Notes (Signed)
EPIC Encounter for ICM Monitoring  Patient Name: Chad Brown is a 76 y.o. male Date: 09/20/2017 Primary Care Physican: Dione Housekeeper, MD Primary Cardiologist:Nishan/McLean Electrophysiologist: Allred Dry Weight:Previous weight 223lbs  Bi-V Pacing: 97.8%      Transmission reviewed.    Thoracic impedance normal.  Prescribed dosage: Furosemide 20 mg 2 tablets (40 mg total) daily. Potassium 20 mEq take 1 tablet daily  Labs: 08/13/2017 Creatinine 1.34, BUN 11, Potassium 4.5, Sodium 136, EGFR 50-58 07/26/2017 Creatinine 1.36, BUN 11, Potassium 4.0, Sodium 139, EGFR 01/24/2017 Creatinine 1.46, BUN 13, Potassium 4.1, Sodium 140, EGFR 45-52 11/21/2016 Creatinine 1.40, BUN 15, Potassium 4.0, Sodium 138, EGFR 48-55 10/25/2017Creatinine 1.29, BUN 31, Potassium 4.2, Sodium 136, EGFR 53->60 05/21/2016 Creatinine 1.24, BUN 16, Potassium 4.2, Sodium 138, EGFR 55->60  03/19/2016 Creatinine 1.09, BUN 15, Potassium 4.6, Sodium 137, EGFR >60  01/19/2016 Creatinine 1.15, BUN 10, Potassium 4.2, Sodium 143, EGFR >60  11/04/2015 Creatinine 1.23, BUN 14, Potassium 4.0, Sodium 142, EGFR 56->60  Recommendations: No changes.    Follow-up plan: ICM clinic phone appointment on 10/21/2017.    Copy of ICM check sent to Dr. Rayann Heman.   3 month ICM trend: 09/19/2017    1 Year ICM trend:       Rosalene Billings, RN 09/20/2017 10:54 AM

## 2017-09-25 ENCOUNTER — Other Ambulatory Visit (HOSPITAL_COMMUNITY): Payer: Self-pay | Admitting: Oncology

## 2017-09-27 ENCOUNTER — Encounter: Payer: Self-pay | Admitting: Internal Medicine

## 2017-10-02 ENCOUNTER — Telehealth: Payer: Self-pay

## 2017-10-02 DIAGNOSIS — I480 Paroxysmal atrial fibrillation: Secondary | ICD-10-CM

## 2017-10-02 DIAGNOSIS — I426 Alcoholic cardiomyopathy: Secondary | ICD-10-CM

## 2017-10-02 DIAGNOSIS — I5022 Chronic systolic (congestive) heart failure: Secondary | ICD-10-CM

## 2017-10-02 NOTE — Telephone Encounter (Signed)
-----   Message from Michaelyn Barter, RN sent at 10/11/2016 12:18 PM EST ----- Patient needs echo f/u in Dec 2018/Jan 2019

## 2017-10-02 NOTE — Telephone Encounter (Signed)
Will send message to scheduling for an appointment.

## 2017-10-03 NOTE — Telephone Encounter (Signed)
Left message for patient to call back to schedule an appointment after his echo.

## 2017-10-10 ENCOUNTER — Other Ambulatory Visit: Payer: Self-pay

## 2017-10-10 ENCOUNTER — Ambulatory Visit (HOSPITAL_COMMUNITY): Payer: Medicare Other | Attending: Cardiovascular Disease

## 2017-10-10 DIAGNOSIS — I5022 Chronic systolic (congestive) heart failure: Secondary | ICD-10-CM | POA: Insufficient documentation

## 2017-10-10 DIAGNOSIS — I480 Paroxysmal atrial fibrillation: Secondary | ICD-10-CM | POA: Diagnosis not present

## 2017-10-10 DIAGNOSIS — I4891 Unspecified atrial fibrillation: Secondary | ICD-10-CM | POA: Diagnosis present

## 2017-10-10 DIAGNOSIS — I252 Old myocardial infarction: Secondary | ICD-10-CM | POA: Diagnosis not present

## 2017-10-10 DIAGNOSIS — Z87891 Personal history of nicotine dependence: Secondary | ICD-10-CM | POA: Diagnosis not present

## 2017-10-10 DIAGNOSIS — Z8249 Family history of ischemic heart disease and other diseases of the circulatory system: Secondary | ICD-10-CM | POA: Diagnosis not present

## 2017-10-10 DIAGNOSIS — I426 Alcoholic cardiomyopathy: Secondary | ICD-10-CM | POA: Diagnosis not present

## 2017-10-10 DIAGNOSIS — I110 Hypertensive heart disease with heart failure: Secondary | ICD-10-CM | POA: Insufficient documentation

## 2017-10-10 DIAGNOSIS — I429 Cardiomyopathy, unspecified: Secondary | ICD-10-CM | POA: Diagnosis present

## 2017-10-10 DIAGNOSIS — E785 Hyperlipidemia, unspecified: Secondary | ICD-10-CM | POA: Insufficient documentation

## 2017-10-10 DIAGNOSIS — G4733 Obstructive sleep apnea (adult) (pediatric): Secondary | ICD-10-CM | POA: Diagnosis not present

## 2017-10-10 DIAGNOSIS — I509 Heart failure, unspecified: Secondary | ICD-10-CM | POA: Diagnosis present

## 2017-10-15 NOTE — Telephone Encounter (Signed)
Patient has appt for 6 month f/u, echo results given to patient as well.

## 2017-10-21 ENCOUNTER — Ambulatory Visit (INDEPENDENT_AMBULATORY_CARE_PROVIDER_SITE_OTHER): Payer: Medicare Other

## 2017-10-21 DIAGNOSIS — Z95810 Presence of automatic (implantable) cardiac defibrillator: Secondary | ICD-10-CM | POA: Diagnosis not present

## 2017-10-21 DIAGNOSIS — I5022 Chronic systolic (congestive) heart failure: Secondary | ICD-10-CM

## 2017-10-21 NOTE — Progress Notes (Signed)
EPIC Encounter for ICM Monitoring  Patient Name: Chad Brown is a 77 y.o. male Date: 10/21/2017 Primary Care Physican: Dione Housekeeper, MD Primary Cardiologist:Nishan/McLean Electrophysiologist: Allred Dry Weight:223lbs  Bi-V Pacing: 97.8%      Heart Failure questions reviewed, pt asymptomatic.  Going to the [facility] 3 days a week.    Thoracic impedance normal.  Prescribed dosage: Furosemide 20 mg 2 tablets (40 mg total) daily. Potassium 20 mEq take 1tabletdaily  Labs: 08/13/2017 Creatinine 1.34, BUN 11, Potassium 4.5, Sodium 136, EGFR 50-58 07/26/2017 Creatinine 1.36, BUN 11, Potassium 4.0, Sodium 139, EGFR 01/24/2017 Creatinine 1.46, BUN 13, Potassium 4.1, Sodium 140, EGFR 45-52 11/21/2016 Creatinine 1.40, BUN 15, Potassium 4.0, Sodium 138, EGFR 48-55 10/25/2017Creatinine 1.29, BUN 31, Potassium 4.2, Sodium 136, EGFR 53->60 05/21/2016 Creatinine 1.24, BUN 16, Potassium 4.2, Sodium 138, EGFR 55->60  03/19/2016 Creatinine 1.09, BUN 15, Potassium 4.6, Sodium 137, EGFR >60  01/19/2016 Creatinine 1.15, BUN 10, Potassium 4.2, Sodium 143, EGFR >60  11/04/2015 Creatinine 1.23, BUN 14, Potassium 4.0, Sodium 142, EGFR 56->60  Recommendations: No changes.   Encouraged to call for fluid symptoms.  Follow-up plan: ICM clinic phone appointment on 11/21/2017.  Office appointment scheduled 11/13/2017 with Dr. Johnsie Cancel.  Copy of ICM check sent to Dr. Rayann Heman.   3 month ICM trend: 10/21/2017    1 Year ICM trend:       Rosalene Billings, RN 10/21/2017 9:31 AM

## 2017-11-01 ENCOUNTER — Other Ambulatory Visit (HOSPITAL_COMMUNITY): Payer: Self-pay | Admitting: Cardiovascular Disease

## 2017-11-05 NOTE — Progress Notes (Signed)
Patient ID: Chad Brown, male   DOB: 06-06-1941, 77 y.o.   MRN: [id number] PCP: [place] Primary Cardiologist: Johnsie Cancel HF Cardiologist: Aundra Dubin  77 y.o.  with paroxysmal atrial fibrillation and chronic systolic CHF thought to be due to nonischemic cardiomyopathy presents f/u.[name] He has a cardiac history dating back to 1998, when he was admitted with chest pain and had cardiac cath showing nonobstructive CAD. 2014 developed  atrial fibrillation.  Echo showed EF 30-35%.  He had DCCV back to NSR.  He was back in atrial fibrillation in 2016, and EF was low again on TEE at that time.   Again, he had DCCV.  In 5/16, cardiac MRI showed persistently low EF, so given LBBB, he had Medtronic CRT-D device placed.  He was back in atrial fibrillation in 6/16 and had TEE-guided DCCV again with EF now 15-20% on TEE.  Repeat echo in 2/17 showed EF 55-60%.  Last echo 10/10/17 reviewed EF 45-50%  Mild LAE   08/07/16  he had colonoscopy with removal of polyps.  He restarted Xarelto the next day.  A few days later, he had profuse BRBPR.  xarelto held and resolved last Hct 09/11/16 30.5    Has remote monitory of CRT and optivol in good range with no significant PAF  Discussed diet and weight loss a lot He eats out and fast food a lot   ROS: All systems reviewed and negative except as per HPI.  Current Outpatient Medications  Medication Sig Dispense Refill  . amiodarone (PACERONE) 200 MG tablet Take 1 tablet (200 mg total) by mouth daily. 15 tablet 9  . carvedilol (COREG) 12.5 MG tablet Take 1 tablet (12.5 mg total) by mouth 2 (two) times daily with a meal. 180 tablet 3  . Coenzyme Q10 (COQ10 GUMMIES ADULT PO) Take 1 tablet at bedtime by mouth.    . dorzolamide-timolol (COSOPT) 22.3-6.8 MG/ML ophthalmic solution Place 1 drop into the right eye 2 (two) times daily.    [name] escitalopram (LEXAPRO) 20 MG tablet TAKE 1 TABLET ONCE A DAY 90 tablet 0  . ezetimibe (ZETIA) 10 MG tablet Take 10 mg by mouth daily.    . ferrous sulfate  325 (65 FE) MG tablet Take 325 mg every other day by mouth.    . finasteride (PROSCAR) 5 MG tablet Take 5 mg daily by mouth.     . furosemide (LASIX) 20 MG tablet Take 40 mg by mouth daily.    [name] gabapentin (NEURONTIN) 100 MG capsule Take 200 mg 2 (two) times daily by mouth. TAKE 2 TAB BY MOUTH IN THE AM AND 2 TABS BY MOUTH IN THE PM    . latanoprost (XALATAN) 0.005 % ophthalmic solution Place 1 drop at bedtime into both eyes.    [name] losartan (COZAAR) 50 MG tablet TAKE 1 TABLET IN THE MORNING AND 1/2 TABLET IN THE EVENING 135 tablet 1  . Netarsudil Dimesylate (RHOPRESSA) 0.02 % SOLN Place 1 drop daily into both eyes.    . niacin (NIASPAN) 500 MG CR tablet Take 1,000 mg at bedtime by mouth.     [name] Glycol-Propyl Glycol (SYSTANE ULTRA OP) Place 1 drop into the left eye 2 (two) times daily.     . potassium chloride SA (K-DUR,KLOR-CON) 20 MEQ tablet Take 20 mEq by mouth daily.    . rivaroxaban (XARELTO) 20 MG TABS tablet Take 1 tablet (20 mg total) daily with supper by mouth. RESTART 08/11/16 90 tablet 3  . spironolactone (ALDACTONE) 25 MG tablet Take  12.5 mg by mouth daily.    . tamsulosin (FLOMAX) 0.4 MG CAPS Take 0.4 mg by mouth at bedtime.      No current facility-administered medications for this visit.    BP 124/80   Pulse 83   Ht 5' 6.5" (1.689 m)   Wt 229 lb 4 oz (104 kg)   SpO2 94%   BMI 36.45 kg/m  Affect appropriate Healthy:  appears stated age 77: normal Neck supple with no adenopathy JVP normal no bruits no thyromegaly Lungs clear with no wheezing and good diaphragmatic motion Heart:  S1/S2 no murmur, no rub, gallop or click PMI normal Abdomen: benighn, BS positve, no tenderness, no AAA no bruit.  No HSM or HJR Distal pulses intact with no bruits No edema Neuro non-focal Skin warm and dry No muscular weakness    Assessment/Plan:  1. Chronic systolic CHF: non ischemic last echo 10/10/17 EF 45-50%  2. Atrial fibrillation: Maintaining NSR continue xarelto  -  Continue amiodarone 100 mg daily. Order PFTls with DLCO TSH and LFTls  3. Hyperlipidemia: LDL remains quite high on Zetia.  He cannot take statins.   4. Post-colonoscopy bleeding: resolved  5. OSA:  CPAP f/u Dr Radford Pax   Followup in 6 months   [name] 11/13/2017 9:40 AM

## 2017-11-13 ENCOUNTER — Ambulatory Visit: Payer: Medicare Other | Admitting: Cardiovascular Disease

## 2017-11-13 ENCOUNTER — Encounter: Payer: Self-pay | Admitting: Cardiovascular Disease

## 2017-11-13 VITALS — BP 124/80 | HR 83 | Ht 66.5 in | Wt 229.2 lb

## 2017-11-13 DIAGNOSIS — E782 Mixed hyperlipidemia: Secondary | ICD-10-CM

## 2017-11-13 DIAGNOSIS — I5022 Chronic systolic (congestive) heart failure: Secondary | ICD-10-CM

## 2017-11-13 DIAGNOSIS — I480 Paroxysmal atrial fibrillation: Secondary | ICD-10-CM | POA: Diagnosis not present

## 2017-11-13 NOTE — Patient Instructions (Addendum)

## 2017-11-14 ENCOUNTER — Other Ambulatory Visit (HOSPITAL_COMMUNITY): Payer: Self-pay | Admitting: Internal Medicine

## 2017-11-19 ENCOUNTER — Other Ambulatory Visit: Payer: Self-pay | Admitting: Nurse Practitioner

## 2017-11-19 DIAGNOSIS — I4819 Other persistent atrial fibrillation: Secondary | ICD-10-CM

## 2017-11-19 DIAGNOSIS — C439 Malignant melanoma of skin, unspecified: Secondary | ICD-10-CM

## 2017-11-21 ENCOUNTER — Ambulatory Visit (INDEPENDENT_AMBULATORY_CARE_PROVIDER_SITE_OTHER): Payer: Medicare Other | Admitting: *Deleted

## 2017-11-21 ENCOUNTER — Encounter: Payer: Self-pay | Admitting: Cardiology

## 2017-11-21 ENCOUNTER — Other Ambulatory Visit: Payer: Self-pay | Admitting: Cardiovascular Disease

## 2017-11-21 ENCOUNTER — Telehealth: Payer: Self-pay

## 2017-11-21 DIAGNOSIS — Z95810 Presence of automatic (implantable) cardiac defibrillator: Secondary | ICD-10-CM

## 2017-11-21 DIAGNOSIS — I5022 Chronic systolic (congestive) heart failure: Secondary | ICD-10-CM

## 2017-11-21 DIAGNOSIS — I426 Alcoholic cardiomyopathy: Secondary | ICD-10-CM

## 2017-11-21 NOTE — Progress Notes (Signed)
Spoke with patient.  Transmission reviewed.  He says he feels fine and denied any fluid symptoms.  He has appt with Chanetta Marshall, NP on 3/20 and next ICM remote transmission will be 4/23.  No changes today and advised to call for any fluid symptoms.

## 2017-11-21 NOTE — Progress Notes (Signed)
Remote ICD transmission.   

## 2017-11-21 NOTE — Telephone Encounter (Signed)
Remote ICM transmission received.  Attempted call to patient and left detailed message per DPR regarding transmission and next ICM scheduled for 01/21/2018.  Advised to return call for any fluid symptoms or questions.

## 2017-11-21 NOTE — Progress Notes (Addendum)
EPIC Encounter for ICM Monitoring  Patient Name: Chad Brown is a 77 y.o. male Date: 11/21/2017 Primary Care Physican: Nyland, Leonard, MD Primary Cardiologist:Nishan/McLean Electrophysiologist: Allred Dry Weight:Previous weight 223lbs  Bi-V Pacing: 97.7%       Attempted call to patient and unable to reach.  Left detailed message regarding transmission.  Transmission reviewed.    Thoracic impedance normal.  Prescribed dosage: Furosemide 20 mg 2 tablets (40 mg total) daily. Potassium 20 mEq take 1tabletdaily  Labs: 08/13/2017 Creatinine 1.34, BUN 11, Potassium 4.5, Sodium 136, EGFR 50-58 07/26/2017 Creatinine 1.36, BUN 11, Potassium 4.0, Sodium 139, EGFR 01/24/2017 Creatinine 1.46, BUN 13, Potassium 4.1, Sodium 140, EGFR 45-52 11/21/2016 Creatinine 1.40, BUN 15, Potassium 4.0, Sodium 138, EGFR 48-55 10/25/2017Creatinine 1.29, BUN 31, Potassium 4.2, Sodium 136, EGFR 53->60 05/21/2016 Creatinine 1.24, BUN 16, Potassium 4.2, Sodium 138, EGFR 55->60  03/19/2016 Creatinine 1.09, BUN 15, Potassium 4.6, Sodium 137, EGFR >60  01/19/2016 Creatinine 1.15, BUN 10, Potassium 4.2, Sodium 143, EGFR >60  11/04/2015 Creatinine 1.23, BUN 14, Potassium 4.0, Sodium 142, EGFR 56->60  Recommendations: Left voice mail with ICM number and encouraged to call if experiencing any fluid symptoms.  Follow-up plan: ICM clinic phone appointment on 01/21/2018.  Office appointment scheduled 12/18/2017 with Amber Seiler, NP.  Copy of ICM check sent to Dr. Allred.   3 month ICM trend: 11/21/2017    1 Year ICM trend:       Laurie S Short, RN 11/21/2017 10:11 AM   

## 2017-11-25 LAB — CUP PACEART REMOTE DEVICE CHECK
Battery Remaining Longevity: 75 mo
Date Time Interrogation Session: 20190221104347
HighPow Impedance: 72 Ohm
Implantable Lead Implant Date: 20160607
Implantable Lead Model: 4598
Lead Channel Impedance Value: 1083 Ohm
Lead Channel Impedance Value: 1102 Ohm
Lead Channel Impedance Value: 380 Ohm
Lead Channel Impedance Value: 570 Ohm
Lead Channel Impedance Value: 779 Ohm
Lead Channel Impedance Value: 950 Ohm
Lead Channel Pacing Threshold Amplitude: 0.75 V
Lead Channel Pacing Threshold Pulse Width: 0.4 ms
Lead Channel Sensing Intrinsic Amplitude: 1.625 mV
Lead Channel Sensing Intrinsic Amplitude: 1.625 mV
Lead Channel Sensing Intrinsic Amplitude: 14.375 mV
Lead Channel Setting Pacing Amplitude: 1.5 V
Lead Channel Setting Pacing Amplitude: 2.25 V
Lead Channel Setting Pacing Pulse Width: 0.4 ms
Lead Channel Setting Pacing Pulse Width: 0.4 ms
MDC IDC LEAD IMPLANT DT: 20160607
MDC IDC LEAD IMPLANT DT: 20160607
MDC IDC LEAD LOCATION: 753858
MDC IDC LEAD LOCATION: 753859
MDC IDC LEAD LOCATION: 753860
MDC IDC MSMT BATTERY VOLTAGE: 2.98 V
MDC IDC MSMT LEADCHNL LV IMPEDANCE VALUE: 589 Ohm
MDC IDC MSMT LEADCHNL LV IMPEDANCE VALUE: 627 Ohm
MDC IDC MSMT LEADCHNL LV IMPEDANCE VALUE: 646 Ohm
MDC IDC MSMT LEADCHNL LV IMPEDANCE VALUE: 855 Ohm
MDC IDC MSMT LEADCHNL LV IMPEDANCE VALUE: 893 Ohm
MDC IDC MSMT LEADCHNL LV PACING THRESHOLD PULSEWIDTH: 0.4 ms
MDC IDC MSMT LEADCHNL RA IMPEDANCE VALUE: 380 Ohm
MDC IDC MSMT LEADCHNL RA PACING THRESHOLD AMPLITUDE: 0.625 V
MDC IDC MSMT LEADCHNL RV IMPEDANCE VALUE: 456 Ohm
MDC IDC MSMT LEADCHNL RV PACING THRESHOLD AMPLITUDE: 0.625 V
MDC IDC MSMT LEADCHNL RV PACING THRESHOLD PULSEWIDTH: 0.4 ms
MDC IDC MSMT LEADCHNL RV SENSING INTR AMPL: 14.375 mV
MDC IDC PG IMPLANT DT: 20160607
MDC IDC SET LEADCHNL RV PACING AMPLITUDE: 2 V
MDC IDC SET LEADCHNL RV SENSING SENSITIVITY: 0.3 mV
MDC IDC STAT BRADY AP VP PERCENT: 97.19 %
MDC IDC STAT BRADY AP VS PERCENT: 1.76 %
MDC IDC STAT BRADY AS VP PERCENT: 0.7 %
MDC IDC STAT BRADY AS VS PERCENT: 0.35 %
MDC IDC STAT BRADY RA PERCENT PACED: 98.89 %
MDC IDC STAT BRADY RV PERCENT PACED: 7.62 %

## 2017-12-16 NOTE — Progress Notes (Signed)
Electrophysiology Office Note Date: 12/18/2017  ID:  DOCTOR SHEAHAN, DOB [birth year], MRN [id number]  PCP: Dione Housekeeper, MD Primary Cardiologist: Johnsie Cancel Electrophysiologist: Allred  CC: AF/ICD follow up  Chad Brown is a 77 y.o. male seen today for Dr Rayann Heman.  He presents today for routine EP follow up.  Since last being seen in clinic, he reports doing relatively well.  He has maintained SR since DCCV 08/2017.  He goes to the [facility] several times a week and does the elliptical for 15-20 minutes followed by weights.  With this activity he does not have shortness of breath. He does note bendopnea.  He denies chest pain, palpitations,PND, orthopnea, nausea, vomiting, dizziness, syncope, edema, weight gain, or early satiety.  He has not had ICD shocks.   Device History: MDT CRTD implanted 2016 for NICM, CHF History of appropriate therapy: No History of AAD therapy: Yes - amiodarone for AF   Past Medical History:  Diagnosis Date  . Adenomatous colon polyp    tubular  . AICD (automatic cardioverter/defibrillator) present 03/08/2015   MDT CRTD   . Anemia    iron deficient  . Arthritis    "about all my joints; hands, knees, back" (03/08/2015)  . Atherosclerosis   . Cataract    left eye small  . Cholelithiasis    gallstones  . Chronic systolic CHF (congestive heart failure) ([place])    a. New dx 12/2012 ? NICM, may be r/t afib. b. Nuc 03/2013 - normal;  c. 03/2015 TEE EF 15-20%.  . Colon polyp, hyperplastic 5/16   removed precancerous lesions  . Depression   . Diverticulosis   . Glaucoma    right eye  . Hyperlipidemia   . Hypertension   . Melanoma of eye ([place]) 2000's   "right; it's never been biopsied"  . Melanoma of lower leg ([place]) 2015   "left; right at my knee"  . Myocardial infarction ([place]) 1998  . OSA (obstructive sleep apnea) 01/04/2016   uses cpap, pt does not know settings auto set  . Peripheral vision loss, right 2006  . Persistent atrial fibrillation ([place])    a.  Dx 12/2012, s/p TEE/DCCV 01/26/13. b. On Xarelto (CHA2DS2VASc = 3);  c. 03/2015 TEE (EF 15-20%, no LAA thrombus) and DCCV - amio increased to 200 mg bid.  . Urinary hesitancy due to benign prostatic hypertrophy    Past Surgical History:  Procedure Laterality Date  . BACK SURGERY     upper back, cannot turn neck well  . CARDIAC CATHETERIZATION  1998  . CARDIOVERSION N/A 01/26/2013   Procedure: CARDIOVERSION;  Surgeon: Lelon Perla, MD;  Location[facility] ENDOSCOPY;  Service: Cardiovascular;  Laterality: N/A;  . CARDIOVERSION N/A 03/23/2015   Procedure: CARDIOVERSION;  Surgeon: Jerline Pain, MD;  Location: [facility] ENDOSCOPY;  Service: Cardiovascular;  Laterality: N/A;  . CARDIOVERSION N/A 08/14/2017   Procedure: CARDIOVERSION;  Surgeon: Josue Hector, MD;  Location: [place];  Service: Cardiovascular;  Laterality: N/A;  . CATARACT EXTRACTION Right ~ 2006  . COLONOSCOPY WITH PROPOFOL N/A 02/10/2015   Procedure: COLONOSCOPY WITH PROPOFOL;  Surgeon: Gatha Mayer, MD;  Location: [facility] ENDOSCOPY;  Service: Endoscopy;  Laterality: N/A;  . COLONOSCOPY WITH PROPOFOL N/A 08/07/2016   Procedure: COLONOSCOPY WITH PROPOFOL;  Surgeon: Gatha Mayer, MD;  Location: [facility] ENDOSCOPY;  Service: Endoscopy;  Laterality: N/A;  . ENTEROSCOPY N/A 08/17/2015   Procedure: ENTEROSCOPY;  Surgeon: Gatha Mayer, MD;  Location: [facility] ENDOSCOPY;  Service: Endoscopy;  Laterality: N/A;  . EP IMPLANTABLE DEVICE N/A 03/08/2015   MDT Hillery Aldo CRT-D for nonischemic CM by Dr Rayann Heman for primary prevention  . GLAUCOMA SURGERY Right ~ 2006   "put 3 stents in to drain fluid" (03/08/2015) not successful, sent to [facility] to try to get last stent out  . HOT HEMOSTASIS N/A 08/07/2016   Procedure: HOT HEMOSTASIS (ARGON PLASMA COAGULATION/BICAP);  Surgeon: Gatha Mayer, MD;  Location: [facility] ENDOSCOPY;  Service: Endoscopy;  Laterality: N/A;  . INCISION AND DRAINAGE ABSCESS POSTERIOR CERVICALSPINE  05/2012  . JOINT REPLACEMENT    . MELANOMA EXCISION Left  2015   "lower leg; right at my knee"  . REFRACTIVE SURGERY Right ~ 2006 X 2   "twice; both done at [place]" (03/08/2015  . SURGERY SCROTAL / TESTICULAR Right 1990's  . TEE WITHOUT CARDIOVERSION N/A 01/26/2013   Procedure: TRANSESOPHAGEAL ECHOCARDIOGRAM (TEE);  Surgeon: Lelon Perla, MD;  Location: [place];  Service: Cardiovascular;  Laterality: N/A;  [name] anes. /   . TEE WITHOUT CARDIOVERSION N/A 10/05/2014   Procedure: TRANSESOPHAGEAL ECHOCARDIOGRAM (TEE)  with cardioversion;  Surgeon: Thayer Headings, MD;  Location[facility] ENDOSCOPY;  Service: Cardiovascular;  Laterality: N/A;  12:52 synched cardioversion at 120 joules,...afib to SR...12 lead EKG ordered.Marland KitchenMarland KitchenCardiozem d/c'ed per MD verbal order at SR  . TEE WITHOUT CARDIOVERSION N/A 03/23/2015   Procedure: TRANSESOPHAGEAL ECHOCARDIOGRAM (TEE);  Surgeon: Jerline Pain, MD;  Location: [facility] ENDOSCOPY;  Service: Cardiovascular;  Laterality: N/A;  . THORACIC SPINE SURGERY  03/2000   "ground calcium deposits from upper thoracic" (01/26/2013)  . TOTAL HIP ARTHROPLASTY Right 06/2007    Current Outpatient Medications  Medication Sig Dispense Refill  . amiodarone (PACERONE) 200 MG tablet Take 1 tablet (200 mg total) by mouth daily. 90 tablet 3  . carvedilol (COREG) 12.5 MG tablet Take 1 tablet (12.5 mg total) by mouth 2 (two) times daily with a meal. 180 tablet 3  . Coenzyme Q10 (COQ10 GUMMIES ADULT PO) Take 1 tablet at bedtime by mouth.    . dorzolamide-timolol (COSOPT) 22.3-6.8 MG/ML ophthalmic solution Place 1 drop into the right eye 2 (two) times daily.    [name] escitalopram (LEXAPRO) 20 MG tablet TAKE 1 TABLET ONCE A DAY 90 tablet 0  . ezetimibe (ZETIA) 10 MG tablet Take 1 tablet (10 mg total) by mouth daily. 90 tablet 3  . ferrous sulfate 325 (65 FE) MG tablet Take 325 mg every other day by mouth.    . finasteride (PROSCAR) 5 MG tablet Take 5 mg daily by mouth.     . furosemide (LASIX) 20 MG tablet Take 40 mg by mouth daily.    [name] gabapentin (NEURONTIN)  100 MG capsule Take 200 mg 2 (two) times daily by mouth. TAKE 2 TAB BY MOUTH IN THE AM AND 2 TABS BY MOUTH IN THE PM    . latanoprost (XALATAN) 0.005 % ophthalmic solution Place 1 drop at bedtime into both eyes.    [name] losartan (COZAAR) 50 MG tablet TAKE 1 TABLET IN THE MORNING AND 1/2 TABLET IN THE EVENING 135 tablet 1  . Netarsudil Dimesylate (RHOPRESSA) 0.02 % SOLN Place 1 drop daily into both eyes.    . niacin (NIASPAN) 500 MG CR tablet Take 1,000 mg at bedtime by mouth.     [name] Glycol-Propyl Glycol (SYSTANE ULTRA OP) Place 1 drop into the left eye 2 (two) times daily.     . potassium chloride SA (K-DUR,KLOR-CON) 20 MEQ tablet Take 20 mEq by mouth  daily.    . rivaroxaban (XARELTO) 20 MG TABS tablet Take 1 tablet (20 mg total) daily with supper by mouth. RESTART 08/11/16 90 tablet 3  . spironolactone (ALDACTONE) 25 MG tablet Take 1 tablet (25 mg total) by mouth daily. 30 tablet 3  . tamsulosin (FLOMAX) 0.4 MG CAPS Take 0.4 mg by mouth at bedtime.     . nitroGLYCERIN (NITROSTAT) 0.4 MG SL tablet Place 0.4 mg under the tongue as needed for chest pain.     No current facility-administered medications for this visit.     Allergies:   Pravastatin sodium and Zithromax [azithromycin]   Social History: Social History   Socioeconomic History  . Marital status: Married    Spouse name: Not on file  . Number of children: 3  . Years of education: Not on file  . Highest education level: Not on file  Social Needs  . Financial resource strain: Not on file  . Food insecurity - worry: Not on file  . Food insecurity - inability: Not on file  . Transportation needs - medical: Not on file  . Transportation needs - non-medical: Not on file  Occupational History  . Occupation: retired  Tobacco Use  . Smoking status: Former Smoker    Packs/day: 3.00    Years: 48.00    Pack years: 144.00    Types: Cigarettes    Last attempt to quit: 09/28/2000    Years since quitting: 17.2  . Smokeless  tobacco: Never Used  Substance and Sexual Activity  . Alcohol use: No  . Drug use: No  . Sexual activity: Yes  Other Topics Concern  . Not on file  Social History Narrative   Pt lives in [place] with spouse.  3 children are grown and healthy.   Retired.  Ran a country store for 30 years, previously worked in the [employer] for 16 years    Family History: Family History  Problem Relation Age of Onset  . Kidney disease Mother   . Heart disease Mother        MI, open heart  . Diabetes Mother        dialysis  . Leukemia Father   . Colon cancer Paternal Uncle   . Lung cancer Paternal Uncle        x 2  . Prostate cancer Paternal Uncle   . Diabetes Maternal Grandmother   . Heart attack Maternal Uncle   . Diabetes Maternal Aunt        x 3  . Diabetes Maternal Uncle     Review of Systems: All other systems reviewed and are otherwise negative except as noted above.   Physical Exam: VS:  BP 126/72   Pulse 83   Ht 5' 6.5" (1.689 m)   Wt 221 lb (100.2 kg)   BMI 35.14 kg/m  , BMI Body mass index is 35.14 kg/m.  GEN- The patient is well appearing, alert and oriented x 3 today.   HEENT: normocephalic, atraumatic; sclera clear, conjunctiva pink; hearing intact; oropharynx clear; neck supple  Lungs- Clear to ausculation bilaterally, normal work of breathing.  No wheezes, rales, rhonchi Heart- Regular rate and rhythm (paced) GI- soft, non-tender, non-distended, bowel sounds present  Extremities- no clubbing, cyanosis, or edema; DP/PT/radial pulses 2+ bilaterally MS- no significant deformity or atrophy Skin- warm and dry, no rash or lesion; ICD pocket well healed Psych- euthymic mood, full affect Neuro- strength and sensation are intact  ICD interrogation- reviewed in detail today,  See PACEART report  EKG:  EKG is not ordered today  Recent Labs: 07/26/2017: ALT 17 08/13/2017: BUN 11; Creatinine, Ser 1.34; Hemoglobin 11.5; Platelets 129; Potassium 4.5; Sodium  136 08/21/2017: TSH 1.746   Wt Readings from Last 3 Encounters:  12/18/17 221 lb (100.2 kg)  11/13/17 229 lb 4 oz (104 kg)  08/21/17 228 lb (103.4 kg)     Other studies Reviewed: Additional studies/ records that were reviewed today include: Dr Jackalyn Lombard office notes, hospital records  Assessment and Plan:  1.  Persistent atrial fibrillation Burden by device interrogation 0% Continue Xarelto for CHADS2VASC of 4 Continue amiodarone, LFT's, TSH reviewed   2.  Chronic systolic dysfunction euvolemic today +bendopnea Increase Sprinolactone to 25mg  daily. BMET later this week with PCP.   3.  HTN Stable No change required today   Current medicines are reviewed at length with the patient today.   The patient does not have concerns regarding his medicines.  The following changes were made today: none  Labs/ tests ordered today include: none  Disposition:   Follow up with Dr Johnsie Cancel as scheduled, [facility], ICM clinic, Dr Rayann Heman 1 year   Signed, Chanetta Marshall, NP 12/18/2017 11:20 AM  [facility] [address] [place] [address] [phone] (office) [phone] (fax)

## 2017-12-18 ENCOUNTER — Ambulatory Visit: Payer: Medicare Other | Admitting: Nurse Practitioner

## 2017-12-18 VITALS — BP 126/72 | HR 83 | Ht 66.5 in | Wt 221.0 lb

## 2017-12-18 DIAGNOSIS — I10 Essential (primary) hypertension: Secondary | ICD-10-CM

## 2017-12-18 DIAGNOSIS — I481 Persistent atrial fibrillation: Secondary | ICD-10-CM | POA: Diagnosis not present

## 2017-12-18 DIAGNOSIS — I5022 Chronic systolic (congestive) heart failure: Secondary | ICD-10-CM

## 2017-12-18 DIAGNOSIS — I4819 Other persistent atrial fibrillation: Secondary | ICD-10-CM

## 2017-12-18 LAB — CUP PACEART INCLINIC DEVICE CHECK
Date Time Interrogation Session: 20190320113342
Implantable Lead Implant Date: 20160607
Implantable Lead Implant Date: 20160607
Implantable Lead Location: 753858
Implantable Lead Model: 4598
Implantable Pulse Generator Implant Date: 20160607
MDC IDC LEAD IMPLANT DT: 20160607
MDC IDC LEAD LOCATION: 753859
MDC IDC LEAD LOCATION: 753860

## 2017-12-18 MED ORDER — SPIRONOLACTONE 25 MG PO TABS: 25.0000 mg | ORAL_TABLET | Freq: Every day | ORAL | 3 refills | Status: DC

## 2017-12-18 NOTE — Patient Instructions (Addendum)
Medication Instructions:   START TAKING  SPIROLACTONE  25 MG ONCE A DAY    If you need a refill on your cardiac medications before your next appointment, please call your pharmacy.  Labwork:  REPEAT BMET  AT PRIMARY VISIT   Testing/Procedures:  NONE ORDERED  TODAY    Follow-Up:  .Your physician wants you to follow-up in: [place] will receive a reminder letter in the mail two months in advance. If you don't receive a letter, please call our office to schedule the follow-up appointment.   Remote monitoring is used to monitor your Pacemaker of ICD from home. This monitoring reduces the number of office visits required to check your device to one time per year. It allows [place] to keep an eye on the functioning of your device to ensure it is working properly. You are scheduled for a device check from home on .  4-23-19You may send your transmission at any time that day. If you have a wireless device, the transmission will be sent automatically. After your physician reviews your transmission, you will receive a postcard with your next transmission date.    Any Other Special Instructions Will Be Listed Below (If Applicable).

## 2017-12-25 ENCOUNTER — Other Ambulatory Visit (HOSPITAL_COMMUNITY): Payer: Self-pay | Admitting: Adult Health

## 2018-01-21 ENCOUNTER — Ambulatory Visit (INDEPENDENT_AMBULATORY_CARE_PROVIDER_SITE_OTHER): Payer: Medicare Other

## 2018-01-21 DIAGNOSIS — I5022 Chronic systolic (congestive) heart failure: Secondary | ICD-10-CM

## 2018-01-21 DIAGNOSIS — Z95810 Presence of automatic (implantable) cardiac defibrillator: Secondary | ICD-10-CM | POA: Diagnosis not present

## 2018-01-21 NOTE — Progress Notes (Signed)
EPIC Encounter for ICM Monitoring  Patient Name: Chad Brown is a 77 y.o. male Date: 01/21/2018 Primary Care Physican: Dione Housekeeper, MD Primary Cardiologist:Nishan/McLean Electrophysiologist: Allred Dry Weight:210lbs  Bi-V Pacing: 98%       Heart Failure questions reviewed, pt asymptomatic.  Going to [facility] 3 days a week.     Thoracic impedance just below baseline normal.  Prescribed dosage: Furosemide 20 mg 2 tablets (40 mg total) daily. Potassium 20 mEq take 1tabletdaily  Labs: 08/13/2017 Creatinine 1.34, BUN 11, Potassium 4.5, Sodium 136, EGFR 50-58 07/26/2017 Creatinine 1.36, BUN 11, Potassium 4.0, Sodium 139, EGFR 01/24/2017 Creatinine 1.46, BUN 13, Potassium 4.1, Sodium 140, EGFR 45-52 11/21/2016 Creatinine 1.40, BUN 15, Potassium 4.0, Sodium 138, EGFR 48-55 10/25/2017Creatinine 1.29, BUN 31, Potassium 4.2, Sodium 136, EGFR 53->60 05/21/2016 Creatinine 1.24, BUN 16, Potassium 4.2, Sodium 138, EGFR 55->60  03/19/2016 Creatinine 1.09, BUN 15, Potassium 4.6, Sodium 137, EGFR >60  01/19/2016 Creatinine 1.15, BUN 10, Potassium 4.2, Sodium 143, EGFR >60  11/04/2015 Creatinine 1.23, BUN 14, Potassium 4.0, Sodium 142, EGFR 56->60  Recommendations: No changes.   Encouraged to call for fluid symptoms.  Follow-up plan: ICM clinic phone appointment on 02/21/2018.  Advised to call Dr Claris Gladden office for visit.   Copy of ICM check sent to Dr. Rayann Heman.   3 month ICM trend: 01/21/2018    1 Year ICM trend:       Rosalene Billings, RN 01/21/2018 10:30 AM

## 2018-01-23 ENCOUNTER — Other Ambulatory Visit (HOSPITAL_COMMUNITY): Payer: Self-pay

## 2018-01-23 DIAGNOSIS — D696 Thrombocytopenia, unspecified: Secondary | ICD-10-CM

## 2018-01-24 ENCOUNTER — Encounter (HOSPITAL_COMMUNITY): Payer: Self-pay | Admitting: Internal Medicine

## 2018-01-24 ENCOUNTER — Other Ambulatory Visit (HOSPITAL_COMMUNITY)
Admission: RE | Admit: 2018-01-24 | Discharge: 2018-01-24 | Disposition: A | Discharge status: Home / Self Care | Payer: Medicare Other | Source: Ambulatory Visit | Attending: Nurse Practitioner | Admitting: Nurse Practitioner

## 2018-01-24 ENCOUNTER — Inpatient Hospital Stay (HOSPITAL_COMMUNITY): Payer: Medicare Other | Attending: Internal Medicine | Admitting: Internal Medicine

## 2018-01-24 ENCOUNTER — Other Ambulatory Visit: Payer: Self-pay

## 2018-01-24 ENCOUNTER — Inpatient Hospital Stay (HOSPITAL_COMMUNITY): Payer: Medicare Other | Attending: Hematology

## 2018-01-24 VITALS — BP 139/64 | HR 66 | Temp 98.2°F | Resp 20 | Wt 215.0 lb

## 2018-01-24 DIAGNOSIS — I429 Cardiomyopathy, unspecified: Secondary | ICD-10-CM | POA: Insufficient documentation

## 2018-01-24 DIAGNOSIS — K862 Cyst of pancreas: Secondary | ICD-10-CM | POA: Insufficient documentation

## 2018-01-24 DIAGNOSIS — I5022 Chronic systolic (congestive) heart failure: Secondary | ICD-10-CM | POA: Insufficient documentation

## 2018-01-24 DIAGNOSIS — E785 Hyperlipidemia, unspecified: Secondary | ICD-10-CM | POA: Diagnosis not present

## 2018-01-24 DIAGNOSIS — I110 Hypertensive heart disease with heart failure: Secondary | ICD-10-CM | POA: Diagnosis not present

## 2018-01-24 DIAGNOSIS — Z7901 Long term (current) use of anticoagulants: Secondary | ICD-10-CM | POA: Insufficient documentation

## 2018-01-24 DIAGNOSIS — I252 Old myocardial infarction: Secondary | ICD-10-CM | POA: Diagnosis not present

## 2018-01-24 DIAGNOSIS — R0602 Shortness of breath: Secondary | ICD-10-CM

## 2018-01-24 DIAGNOSIS — Z79899 Other long term (current) drug therapy: Secondary | ICD-10-CM | POA: Insufficient documentation

## 2018-01-24 DIAGNOSIS — G4733 Obstructive sleep apnea (adult) (pediatric): Secondary | ICD-10-CM | POA: Insufficient documentation

## 2018-01-24 DIAGNOSIS — D649 Anemia, unspecified: Secondary | ICD-10-CM | POA: Insufficient documentation

## 2018-01-24 DIAGNOSIS — F329 Major depressive disorder, single episode, unspecified: Secondary | ICD-10-CM | POA: Insufficient documentation

## 2018-01-24 DIAGNOSIS — D696 Thrombocytopenia, unspecified: Secondary | ICD-10-CM

## 2018-01-24 DIAGNOSIS — Z87891 Personal history of nicotine dependence: Secondary | ICD-10-CM | POA: Insufficient documentation

## 2018-01-24 DIAGNOSIS — H409 Unspecified glaucoma: Secondary | ICD-10-CM | POA: Insufficient documentation

## 2018-01-24 DIAGNOSIS — Z95810 Presence of automatic (implantable) cardiac defibrillator: Secondary | ICD-10-CM | POA: Insufficient documentation

## 2018-01-24 DIAGNOSIS — C439 Malignant melanoma of skin, unspecified: Secondary | ICD-10-CM

## 2018-01-24 DIAGNOSIS — I481 Persistent atrial fibrillation: Secondary | ICD-10-CM | POA: Diagnosis not present

## 2018-01-24 DIAGNOSIS — Z85820 Personal history of malignant melanoma of skin: Secondary | ICD-10-CM | POA: Diagnosis present

## 2018-01-24 LAB — CBC WITH DIFFERENTIAL/PLATELET
Basophils Absolute: 0 10*3/uL (ref 0.0–0.1)
Basophils Relative: 0 %
Eosinophils Absolute: 0 10*3/uL (ref 0.0–0.7)
Eosinophils Relative: 1 %
HEMATOCRIT: 32.2 % — AB (ref 39.0–52.0)
HEMOGLOBIN: 10.1 g/dL — AB (ref 13.0–17.0)
LYMPHS ABS: 0.9 10*3/uL (ref 0.7–4.0)
Lymphocytes Relative: 21 %
MCH: 29.5 pg (ref 26.0–34.0)
MCHC: 31.4 g/dL (ref 30.0–36.0)
MCV: 94.2 fL (ref 78.0–100.0)
MONOS PCT: 14 %
Monocytes Absolute: 0.6 10*3/uL (ref 0.1–1.0)
NEUTROS ABS: 2.9 10*3/uL (ref 1.7–7.7)
NEUTROS PCT: 64 %
Platelets: 141 10*3/uL — ABNORMAL LOW (ref 150–400)
RBC: 3.42 MIL/uL — ABNORMAL LOW (ref 4.22–5.81)
RDW: 13.5 % (ref 11.5–15.5)
WBC: 4.5 10*3/uL (ref 4.0–10.5)

## 2018-01-24 LAB — COMPREHENSIVE METABOLIC PANEL
ALBUMIN: 3.4 g/dL — AB (ref 3.5–5.0)
ALK PHOS: 91 U/L (ref 38–126)
ALT: 33 U/L (ref 17–63)
AST: 20 U/L (ref 15–41)
Anion gap: 8 (ref 5–15)
BILIRUBIN TOTAL: 0.4 mg/dL (ref 0.3–1.2)
BUN: 19 mg/dL (ref 6–20)
CALCIUM: 8.7 mg/dL — AB (ref 8.9–10.3)
CO2: 25 mmol/L (ref 22–32)
CREATININE: 1.24 mg/dL (ref 0.61–1.24)
Chloride: 105 mmol/L (ref 101–111)
GFR calc Af Amer: >60 mL/min (ref >60)
GFR calc non Af Amer: 54 mL/min — ABNORMAL LOW (ref >60)
GLUCOSE: 115 mg/dL — AB (ref 65–99)
Potassium: 4.9 mmol/L (ref 3.5–5.1)
Sodium: 138 mmol/L (ref 135–145)
TOTAL PROTEIN: 6.5 g/dL (ref 6.5–8.1)

## 2018-01-24 LAB — LACTATE DEHYDROGENASE: LDH: 143 U/L (ref 98–192)

## 2018-01-24 LAB — IRON AND TIBC
Iron: 46 ug/dL (ref 45–182)
Saturation Ratios: 21 % (ref 17.9–39.5)
TIBC: 224 ug/dL — ABNORMAL LOW (ref 250–450)
UIBC: 178 ug/dL

## 2018-01-24 LAB — FERRITIN: Ferritin: 517 ng/mL — ABNORMAL HIGH (ref 24–336)

## 2018-01-24 NOTE — Patient Instructions (Signed)
Exam and discussion today with Dr. Walden Field. CT scan as scheduled. Office visit as scheduled. We will call you with the results of your iron studies when we have them.

## 2018-01-24 NOTE — Progress Notes (Signed)
Diagnosis Pancreatic cyst - Plan: CT Abdomen Pelvis W Contrast, CBC with Differential/Platelet, Comprehensive metabolic panel, Lactate dehydrogenase, Ferritin, CBC with Differential/Platelet, Comprehensive metabolic panel, Lactate dehydrogenase, Ferritin  Shortness of breath - Plan: CT Chest W Contrast  Melanoma of skin ([place]) - Plan: CT Abdomen Pelvis W Contrast, CBC with Differential/Platelet, Comprehensive metabolic panel, Lactate dehydrogenase, Ferritin, CBC with Differential/Platelet, Comprehensive metabolic panel, Lactate dehydrogenase, Ferritin  Staging Cancer Staging No matching staging information was found for the patient.  Assessment and Plan:  1.  T2a N0 Melanoma of the L Thigh s/p WLE and sentinel node with Dr. Freddi Che.  He was previously followed by Dr. Talbert Cage.  Last CT scan of the abdomen pelvis was done October 2017.  He will be set up for repeat imaging with CT chest abdomen and pelvis in October 2019 for interval follow-up.  He should continue being seen by Dr. Nevada Crane of dermatology.  Denies any skin changes.  Pending scan results he will be seen yearly for follow-up.  2.  Pancreatic cyst.  This was noted on CT of the abdomen pelvis that was done October 2017.  He was recommended for repeat evaluation in October 2019.  This is scheduled and patient will be notified of the results.  3.  Shortness of breath.  He describes this as mild.  Pulse ox is 100% on room air.  He will be set up for CT of the chest with his imaging due to his history of melanoma.  4.  Anemia.  Hemoglobin is 10 today.  Cr 1.24.  He will be notified regarding his iron results.  Last IV iron was in March 2018.  5.  Question regarding PSA testing.  I have discussed with him he would need to discuss this with his PCP to determine if screening would be recommended.  6.  Hypertension.  Blood pressure is 139/64.  Follow-up with PCP.  7.  Thrombocytopenia.  Platelet count is 141,000.  Will repeat labs in October  2019.   Interval history:  T2a N0 Melanoma of the L Thigh s/p WLE and sentinel node with Dr. Freddi Che.  He was previously followed by Dr. Talbert Cage.  Last CT scan of the abdomen pelvis was done October 2017.  Current status: Patient is seen today for follow-up.  He is requesting to have PSA testing.  He is here to go over lab studies.   Problem List Patient Active Problem List   Diagnosis Date Noted  . Iron deficiency anemia [D50.9] 05/25/2016  . OSA (obstructive sleep apnea) [G47.33] 01/04/2016  . Heme positive stool [R19.5]   . Atrial fibrillation with RVR ([place]) [I48.91] 03/21/2015  . Chronic systolic CHF (congestive heart failure) ([place]) [I50.22] 03/08/2015  . Hx of adenomatous colonic polyps [Z86.010] 02/10/2015  . Pancreatic cyst [K86.2] 02/10/2015  . Loose stools [R19.5] 02/10/2015  . Encounter for long-term (current) use of antiplatelets/antithrombotics [Z79.02] 01/10/2015  . Hypotension [I95.9] 10/04/2014  . Nonischemic cardiomyopathy ([place]) [I42.8] 10/04/2014  . Hyperglycemia [R73.9] 10/04/2014  . Troponin level elevated [R74.8] 10/04/2014  . Sleep-disordered breathing [G47.30] 10/04/2014  . Obesity [E66.9] 10/04/2014  . Anemia, mild [D64.9] 10/04/2014  . Thrombocytopenia ([place]) [D69.6] 10/04/2014  . Atrial fibrillation with rapid ventricular response ([place]) [I48.91] 10/02/2014  . Sinus bradycardia [R00.1] 10/02/2014  . Acute on chronic systolic CHF (congestive heart failure) ([place]) [I50.23] 10/02/2014  . Skin cancer [C44.90] 07/21/2014  . cardiomyopathy [I42.6] 01/24/2013  . Atrial fibrillation ([place]) [I48.91] 01/22/2013  . Arthritis [M19.90]   . Hyperlipidemia [E78.5]   .  Hypertension [I10]   . Infected sebaceous cyst [L72.3, L08.9] 05/15/2012    Past Medical History Past Medical History:  Diagnosis Date  . Adenomatous colon polyp    tubular  . AICD (automatic cardioverter/defibrillator) present 03/08/2015   MDT CRTD   . Anemia    iron deficient  . Arthritis    "about  all my joints; hands, knees, back" (03/08/2015)  . Atherosclerosis   . Cataract    left eye small  . Cholelithiasis    gallstones  . Chronic systolic CHF (congestive heart failure) ([place])    a. New dx 12/2012 ? NICM, may be r/t afib. b. Nuc 03/2013 - normal;  c. 03/2015 TEE EF 15-20%.  . Colon polyp, hyperplastic 5/16   removed precancerous lesions  . Depression   . Diverticulosis   . Glaucoma    right eye  . Hyperlipidemia   . Hypertension   . Melanoma of eye ([place]) 2000's   "right; it's never been biopsied"  . Melanoma of lower leg ([place]) 2015   "left; right at my knee"  . Myocardial infarction ([place]) 1998  . OSA (obstructive sleep apnea) 01/04/2016   uses cpap, pt does not know settings auto set  . Peripheral vision loss, right 2006  . Persistent atrial fibrillation ([place])    a. Dx 12/2012, s/p TEE/DCCV 01/26/13. b. On Xarelto (CHA2DS2VASc = 3);  c. 03/2015 TEE (EF 15-20%, no LAA thrombus) and DCCV - amio increased to 200 mg bid.  . Urinary hesitancy due to benign prostatic hypertrophy     Past Surgical History Past Surgical History:  Procedure Laterality Date  . BACK SURGERY     upper back, cannot turn neck well  . CARDIAC CATHETERIZATION  1998  . CARDIOVERSION N/A 01/26/2013   Procedure: CARDIOVERSION;  Surgeon: Lelon Perla, MD;  Location[facility] ENDOSCOPY;  Service: Cardiovascular;  Laterality: N/A;  . CARDIOVERSION N/A 03/23/2015   Procedure: CARDIOVERSION;  Surgeon: Jerline Pain, MD;  Location: [facility] ENDOSCOPY;  Service: Cardiovascular;  Laterality: N/A;  . CARDIOVERSION N/A 08/14/2017   Procedure: CARDIOVERSION;  Surgeon: Josue Hector, MD;  Location: [place];  Service: Cardiovascular;  Laterality: N/A;  . CATARACT EXTRACTION Right ~ 2006  . COLONOSCOPY WITH PROPOFOL N/A 02/10/2015   Procedure: COLONOSCOPY WITH PROPOFOL;  Surgeon: Gatha Mayer, MD;  Location: [facility] ENDOSCOPY;  Service: Endoscopy;  Laterality: N/A;  . COLONOSCOPY WITH PROPOFOL N/A 08/07/2016   Procedure:  COLONOSCOPY WITH PROPOFOL;  Surgeon: Gatha Mayer, MD;  Location: [facility] ENDOSCOPY;  Service: Endoscopy;  Laterality: N/A;  . ENTEROSCOPY N/A 08/17/2015   Procedure: ENTEROSCOPY;  Surgeon: Gatha Mayer, MD;  Location: [facility] ENDOSCOPY;  Service: Endoscopy;  Laterality: N/A;  . EP IMPLANTABLE DEVICE N/A 03/08/2015   MDT Hillery Aldo CRT-D for nonischemic CM by Dr Rayann Heman for primary prevention  . GLAUCOMA SURGERY Right ~ 2006   "put 3 stents in to drain fluid" (03/08/2015) not successful, sent to [facility] to try to get last stent out  . HOT HEMOSTASIS N/A 08/07/2016   Procedure: HOT HEMOSTASIS (ARGON PLASMA COAGULATION/BICAP);  Surgeon: Gatha Mayer, MD;  Location: [facility] ENDOSCOPY;  Service: Endoscopy;  Laterality: N/A;  . INCISION AND DRAINAGE ABSCESS POSTERIOR CERVICALSPINE  05/2012  . JOINT REPLACEMENT    . MELANOMA EXCISION Left 2015   "lower leg; right at my knee"  . REFRACTIVE SURGERY Right ~ 2006 X 2   "twice; both done at [place]" (03/08/2015  . SURGERY SCROTAL / TESTICULAR Right 1990's  .  TEE WITHOUT CARDIOVERSION N/A 01/26/2013   Procedure: TRANSESOPHAGEAL ECHOCARDIOGRAM (TEE);  Surgeon: Lelon Perla, MD;  Location: [place];  Service: Cardiovascular;  Laterality: N/A;  [name] anes. /   . TEE WITHOUT CARDIOVERSION N/A 10/05/2014   Procedure: TRANSESOPHAGEAL ECHOCARDIOGRAM (TEE)  with cardioversion;  Surgeon: Thayer Headings, MD;  Location: [facility] ENDOSCOPY;  Service: Cardiovascular;  Laterality: N/A;  12:52 synched cardioversion at 120 joules,...afib to SR...12 lead EKG ordered.Marland KitchenMarland KitchenCardiozem d/c'ed per MD verbal order at SR  . TEE WITHOUT CARDIOVERSION N/A 03/23/2015   Procedure: TRANSESOPHAGEAL ECHOCARDIOGRAM (TEE);  Surgeon: Jerline Pain, MD;  Location: [facility] ENDOSCOPY;  Service: Cardiovascular;  Laterality: N/A;  . THORACIC SPINE SURGERY  03/2000   "ground calcium deposits from upper thoracic" (01/26/2013)  . TOTAL HIP ARTHROPLASTY Right 06/2007    Family History Family History  Problem Relation Age of  Onset  . Kidney disease Mother   . Heart disease Mother        MI, open heart  . Diabetes Mother        dialysis  . Leukemia Father   . Colon cancer Paternal Uncle   . Lung cancer Paternal Uncle        x 2  . Prostate cancer Paternal Uncle   . Diabetes Maternal Grandmother   . Heart attack Maternal Uncle   . Diabetes Maternal Aunt        x 3  . Diabetes Maternal Uncle      Social History  reports that he quit smoking about 17 years ago. His smoking use included cigarettes. He has a 144.00 pack-year smoking history. He has never used smokeless tobacco. He reports that he does not drink alcohol or use drugs.  Medications  Current Outpatient Medications:  .  amiodarone (PACERONE) 200 MG tablet, Take 1 tablet (200 mg total) by mouth daily., Disp: 90 tablet, Rfl: 3 .  carvedilol (COREG) 12.5 MG tablet, Take 1 tablet (12.5 mg total) by mouth 2 (two) times daily with a meal., Disp: 180 tablet, Rfl: 3 .  Coenzyme Q10 (COQ10 GUMMIES ADULT PO), Take 1 tablet at bedtime by mouth., Disp: , Rfl:  .  dorzolamide-timolol (COSOPT) 22.3-6.8 MG/ML ophthalmic solution, Place 1 drop into the right eye 2 (two) times daily., Disp: , Rfl:  .  escitalopram (LEXAPRO) 20 MG tablet, TAKE 1 TABLET ONCE A DAY, Disp: 90 tablet, Rfl: 0 .  ezetimibe (ZETIA) 10 MG tablet, Take 1 tablet (10 mg total) by mouth daily., Disp: 90 tablet, Rfl: 3 .  ferrous sulfate 325 (65 FE) MG tablet, Take 325 mg every other day by mouth., Disp: , Rfl:  .  finasteride (PROSCAR) 5 MG tablet, Take 5 mg daily by mouth. , Disp: , Rfl:  .  furosemide (LASIX) 20 MG tablet, Take 40 mg by mouth daily., Disp: , Rfl:  .  gabapentin (NEURONTIN) 100 MG capsule, Take 200 mg 2 (two) times daily by mouth. TAKE 2 TAB BY MOUTH IN THE AM AND 2 TABS BY MOUTH IN THE PM, Disp: , Rfl:  .  latanoprost (XALATAN) 0.005 % ophthalmic solution, Place 1 drop at bedtime into both eyes., Disp: , Rfl:  .  losartan (COZAAR) 50 MG tablet, TAKE 1 TABLET IN THE MORNING  AND 1/2 TABLET IN THE EVENING, Disp: 135 tablet, Rfl: 1 .  Netarsudil Dimesylate (RHOPRESSA) 0.02 % SOLN, Place 1 drop daily into both eyes., Disp: , Rfl:  .  niacin (NIASPAN) 500 MG CR tablet, Take 1,000 mg at bedtime by  mouth. , Disp: , Rfl:  .  nitroGLYCERIN (NITROSTAT) 0.4 MG SL tablet, Place 0.4 mg under the tongue as needed for chest pain., Disp: , Rfl:  .  Polyethyl Glycol-Propyl Glycol (SYSTANE ULTRA OP), Place 1 drop into the left eye 2 (two) times daily. , Disp: , Rfl:  .  potassium chloride SA (K-DUR,KLOR-CON) 20 MEQ tablet, Take 20 mEq by mouth daily., Disp: , Rfl:  .  rivaroxaban (XARELTO) 20 MG TABS tablet, Take 1 tablet (20 mg total) daily with supper by mouth. RESTART 08/11/16, Disp: 90 tablet, Rfl: 3 .  spironolactone (ALDACTONE) 25 MG tablet, Take 1 tablet (25 mg total) by mouth daily., Disp: 30 tablet, Rfl: 3 .  tamsulosin (FLOMAX) 0.4 MG CAPS, Take 0.4 mg by mouth at bedtime. , Disp: , Rfl:   Allergies Pravastatin sodium and Zithromax [azithromycin]  Review of Systems Review of Systems - Oncology ROS as per HPI otherwise 12 point ROS is negative.   Physical Exam  Vitals Wt Readings from Last 3 Encounters:  01/24/18 215 lb (97.5 kg)  12/18/17 221 lb (100.2 kg)  11/13/17 229 lb 4 oz (104 kg)   Temp Readings from Last 3 Encounters:  01/24/18 98.2 F (36.8 C) (Oral)  08/14/17 98 F (36.7 C) (Oral)  07/26/17 98 F (36.7 C) (Oral)   BP Readings from Last 3 Encounters:  01/24/18 139/64  12/18/17 126/72  11/13/17 124/80   Pulse Readings from Last 3 Encounters:  01/24/18 66  12/18/17 83  11/13/17 83    Constitutional: Well-developed, well-nourished, and in no distress.   HENT: Head: Normocephalic and atraumatic.  Mouth/Throat: No oropharyngeal exudate. Mucosa moist. Eyes: Pupils are equal, round, and reactive to light. Conjunctivae are normal. No scleral icterus.  Neck: Normal range of motion. Neck supple. No JVD present.  Cardiovascular: Normal rate,  regular rhythm and normal heart sounds.  Exam reveals no gallop and no friction rub.   No murmur heard. Pulmonary/Chest: Effort normal and breath sounds normal. No respiratory distress. No wheezes.No rales.  Abdominal: Soft. Bowel sounds are normal. No distension. There is no tenderness. There is no guarding.  Musculoskeletal: No edema or tenderness.  Lymphadenopathy: No cervical, axillary or supraclavicular adenopathy.  Neurological: Alert and oriented to person, place, and time. No cranial nerve deficit.  Skin: Skin is warm and dry. No rashes noted. Chronic solar changes noted.   Psychiatric: Affect and judgment normal.   Labs Appointment on 01/24/2018  Component Date Value Ref Range Status  . WBC 01/24/2018 4.5  4.0 - 10.5 K/uL Final  . RBC 01/24/2018 3.42* 4.22 - 5.81 MIL/uL Final  . Hemoglobin 01/24/2018 10.1* 13.0 - 17.0 g/dL Final  . HCT 01/24/2018 32.2* 39.0 - 52.0 % Final  . MCV 01/24/2018 94.2  78.0 - 100.0 fL Final  . MCH 01/24/2018 29.5  26.0 - 34.0 pg Final  . MCHC 01/24/2018 31.4  30.0 - 36.0 g/dL Final  . RDW 01/24/2018 13.5  11.5 - 15.5 % Final  . Platelets 01/24/2018 141* 150 - 400 K/uL Final  . Neutrophils Relative % 01/24/2018 64  % Final  . Neutro Abs 01/24/2018 2.9  1.7 - 7.7 K/uL Final  . Lymphocytes Relative 01/24/2018 21  % Final  . Lymphs Abs 01/24/2018 0.9  0.7 - 4.0 K/uL Final  . Monocytes Relative 01/24/2018 14  % Final  . Monocytes Absolute 01/24/2018 0.6  0.1 - 1.0 K/uL Final  . Eosinophils Relative 01/24/2018 1  % Final  . Eosinophils Absolute 01/24/2018 0.0  0.0 - 0.7 K/uL Final  . Basophils Relative 01/24/2018 0  % Final  . Basophils Absolute 01/24/2018 0.0  0.0 - 0.1 K/uL Final   Performed at [facility], [address], [place], [place] [address]  . Sodium 01/24/2018 138  135 - 145 mmol/L Final  . Potassium 01/24/2018 4.9  3.5 - 5.1 mmol/L Final  . Chloride 01/24/2018 105  101 - 111 mmol/L Final  . CO2 01/24/2018 25  22 - 32 mmol/L Final  .  Glucose, Bld 01/24/2018 115* 65 - 99 mg/dL Final  . BUN 01/24/2018 19  6 - 20 mg/dL Final  . Creatinine, Ser 01/24/2018 1.24  0.61 - 1.24 mg/dL Final  . Calcium 01/24/2018 8.7* 8.9 - 10.3 mg/dL Final  . Total Protein 01/24/2018 6.5  6.5 - 8.1 g/dL Final  . Albumin 01/24/2018 3.4* 3.5 - 5.0 g/dL Final  . AST 01/24/2018 20  15 - 41 U/L Final  . ALT 01/24/2018 33  17 - 63 U/L Final  . Alkaline Phosphatase 01/24/2018 91  38 - 126 U/L Final  . Total Bilirubin 01/24/2018 0.4  0.3 - 1.2 mg/dL Final  . GFR calc non Af Amer 01/24/2018 54* >60 mL/min Final  . GFR calc Af Amer 01/24/2018 >60  >60 mL/min Final   Comment: (NOTE) The eGFR has been calculated using the CKD EPI equation. This calculation has not been validated in all clinical situations. eGFR's persistently <60 mL/min signify possible Chronic Kidney Disease.   Georgiann Hahn gap 01/24/2018 8  5 - 15 Final   Performed at [facility], [address], [place], [place] [address]  . LDH 01/24/2018 143  98 - 192 U/L Final   Performed at [facility], [address] [place], [place] [address]     Pathology Orders Placed This Encounter  Procedures  . CT Chest W Contrast    Standing Status:   Future    Standing Expiration Date:   01/24/2019    Order Specific Question:   If indicated for the ordered procedure, I authorize the administration of contrast media per Radiology protocol    Answer:   Yes    Order Specific Question:   Preferred imaging location?    Answer:   [facility]    Order Specific Question:   Radiology Contrast Protocol - do NOT remove file path    Answer:   [email or web address]  . CT Abdomen Pelvis W Contrast    Standing Status:   Future    Standing Expiration Date:   01/24/2019    Order Specific Question:   If indicated for the ordered procedure, I authorize the administration of contrast media per Radiology protocol    Answer:   Yes    Order Specific Question:   Preferred imaging location?     Answer:   [facility]    Order Specific Question:   Is Oral Contrast requested for this exam?    Answer:   Per Radiology protocol    Order Specific Question:   Radiology Contrast Protocol - do NOT remove file path    Answer:   \\[email or web address]  . CBC with Differential/Platelet    Standing Status:   Future    Standing Expiration Date:   01/25/2020  . Comprehensive metabolic panel    Standing Status:   Future    Standing Expiration Date:   01/25/2020  . Lactate dehydrogenase    Standing Status:   Future    Standing Expiration Date:   01/25/2020  .  Ferritin    Standing Status:   Future    Standing Expiration Date:   01/25/2020  . CBC with Differential/Platelet    Lab only    Standing Status:   Future    Standing Expiration Date:   01/25/2019  . Comprehensive metabolic panel    Lab only    Standing Status:   Future    Standing Expiration Date:   01/25/2019  . Lactate dehydrogenase    Lab only    Standing Status:   Future    Standing Expiration Date:   01/25/2019  . Ferritin    Lab only    Standing Status:   Future    Standing Expiration Date:   01/25/2019       Zoila Shutter MD

## 2018-01-27 ENCOUNTER — Other Ambulatory Visit (HOSPITAL_COMMUNITY): Payer: Self-pay | Admitting: Internal Medicine

## 2018-01-27 DIAGNOSIS — D649 Anemia, unspecified: Secondary | ICD-10-CM

## 2018-01-30 ENCOUNTER — Telehealth (HOSPITAL_COMMUNITY): Payer: Self-pay | Admitting: *Deleted

## 2018-01-30 NOTE — Telephone Encounter (Signed)
Pt aware of lab results and aware that we would need to recheck his labs in 3 months. Pt verbalized understanding and Amy, scheduler, called to schedule appointment.

## 2018-02-20 ENCOUNTER — Ambulatory Visit (INDEPENDENT_AMBULATORY_CARE_PROVIDER_SITE_OTHER): Payer: Medicare Other | Admitting: *Deleted

## 2018-02-20 DIAGNOSIS — I426 Alcoholic cardiomyopathy: Secondary | ICD-10-CM

## 2018-02-20 DIAGNOSIS — I5022 Chronic systolic (congestive) heart failure: Secondary | ICD-10-CM

## 2018-02-20 NOTE — Progress Notes (Signed)
Remote ICD transmission.   

## 2018-02-21 ENCOUNTER — Ambulatory Visit (INDEPENDENT_AMBULATORY_CARE_PROVIDER_SITE_OTHER): Payer: Medicare Other

## 2018-02-21 ENCOUNTER — Telehealth: Payer: Self-pay

## 2018-02-21 DIAGNOSIS — Z95810 Presence of automatic (implantable) cardiac defibrillator: Secondary | ICD-10-CM | POA: Diagnosis not present

## 2018-02-21 DIAGNOSIS — Z79899 Other long term (current) drug therapy: Secondary | ICD-10-CM

## 2018-02-21 DIAGNOSIS — I5022 Chronic systolic (congestive) heart failure: Secondary | ICD-10-CM | POA: Diagnosis not present

## 2018-02-21 NOTE — Telephone Encounter (Signed)
Spoke with patient regarding device report and fluid levels.  He asked if he should continue Losartan. He has been holding dosages as Dr Johnsie Cancel instructed when BP is < 949 systolic.  Most of his BP readings are 44-73 systolic and 95-84 diastolic that causes some dizziness. He has only taken a few dosages in the last 4-6 weeks.  He would like to know if he should discontinue to the medication.  Advised would send to Dr Johnsie Cancel and his nurse for review and recommendation. He stated there is no urgency and will continue taking if needed as instructed until he receives a call back.

## 2018-02-21 NOTE — Progress Notes (Signed)
EPIC Encounter for ICM Monitoring  Patient Name: Chad Brown is a 77 y.o. male Date: 02/21/2018 Primary Care Physican: Dione Housekeeper, MD Primary Cardiologist:Nishan/McLean Electrophysiologist: Allred Dry Weight:205lbs  Bi-V Pacing: 97.9%       Heart Failure questions reviewed, pt asymptomatic.  Since 01/29/2018 his BP has been running low.  Dr Johnsie Cancel has told him if systolic is <056 not to take the Losartan.  Most of the BP's have been running 78'G and 93H systolic and diastolic usually in the low 50's to mid 60's.  He has dizziness when the BP is low.  Message sent to [facility] street triage due to Dr Kyla Balzarine nurse is out of the office on 5/28.   Thoracic impedance normal.  Prescribed dosage: Furosemide 20 mg 2 tablets (40 mg total) daily. Potassium 20 mEq take 1tabletdaily  Labs: 01/24/2018 Creatinine 1.24, BUN 19, Potassium 4.9, Sodium 138, EGFR 54->60 08/13/2017 Creatinine 1.34, BUN 11, Potassium 4.5, Sodium 136, EGFR 50-58 07/26/2017 Creatinine 1.36, BUN 11, Potassium 4.0, Sodium 139, EGFR 01/24/2017 Creatinine 1.46, BUN 13, Potassium 4.1, Sodium 140, EGFR 45-52 11/21/2016 Creatinine 1.40, BUN 15, Potassium 4.0, Sodium 138, EGFR 48-55 10/25/2017Creatinine 1.29, BUN 31, Potassium 4.2, Sodium 136, EGFR 53->60 05/21/2016 Creatinine 1.24, BUN 16, Potassium 4.2, Sodium 138, EGFR 55->60  03/19/2016 Creatinine 1.09, BUN 15, Potassium 4.6, Sodium 137, EGFR >60  01/19/2016 Creatinine 1.15, BUN 10, Potassium 4.2, Sodium 143, EGFR >60  11/04/2015 Creatinine 1.23, BUN 14, Potassium 4.0, Sodium 142, EGFR 56->60  Recommendations: No changes.  Reinforced fluid restriction to < 2 L daily and sodium restriction to less than 2000 mg daily.  Encouraged to call for fluid symptoms.  Follow-up plan: ICM clinic phone appointment on 03/24/2018.    Copy of ICM check sent to Dr. Rayann Heman.   3 month ICM trend: 02/21/2018    1 Year ICM trend:       Rosalene Billings, RN 02/21/2018 12:05  PM

## 2018-02-23 NOTE — Telephone Encounter (Signed)
Cut lasix back to 20 /day and cut K in half as well Try to take losartan qod  F/u BMET and BNP in 2 weeks

## 2018-02-25 MED ORDER — LOSARTAN POTASSIUM 50 MG PO TABS: 50.0000 mg | ORAL_TABLET | ORAL | Status: DC

## 2018-02-25 MED ORDER — FUROSEMIDE 20 MG PO TABS: 20.0000 mg | ORAL_TABLET | Freq: Every day | ORAL | Status: DC

## 2018-02-25 MED ORDER — POTASSIUM CHLORIDE CRYS ER 10 MEQ PO TBCR: 10.0000 meq | EXTENDED_RELEASE_TABLET | Freq: Every day | ORAL | Status: DC

## 2018-02-25 NOTE — Addendum Note (Signed)
Addended by: Aris [place], Duel Conrad L on: 02/25/2018 08:27 AM   Modules accepted: Orders

## 2018-02-25 NOTE — Telephone Encounter (Signed)
Called patient with Dr. Johnsie Cancel recommendations. Patient will cut lasix back to 20 mg by mouth daily and cut potassium in half as well. Patient will try taking losartan every other day. Patient will get lab work done at [facility] in two weeks. Released orders so patient can have lab work done at [facility]. Patient verbalized understanding.

## 2018-03-11 ENCOUNTER — Other Ambulatory Visit (HOSPITAL_COMMUNITY)
Admission: RE | Admit: 2018-03-11 | Discharge: 2018-03-11 | Disposition: A | Discharge status: Home / Self Care | Payer: Medicare Other | Source: Ambulatory Visit | Attending: Cardiovascular Disease | Admitting: Cardiovascular Disease

## 2018-03-11 DIAGNOSIS — Z79899 Other long term (current) drug therapy: Secondary | ICD-10-CM | POA: Insufficient documentation

## 2018-03-11 LAB — BASIC METABOLIC PANEL
Anion gap: 7 (ref 5–15)
BUN: 13 mg/dL (ref 6–20)
CHLORIDE: 110 mmol/L (ref 101–111)
CO2: 25 mmol/L (ref 22–32)
Calcium: 8.7 mg/dL — ABNORMAL LOW (ref 8.9–10.3)
Creatinine, Ser: 1.11 mg/dL (ref 0.61–1.24)
GFR calc Af Amer: >60 mL/min (ref >60)
GFR calc non Af Amer: >60 mL/min (ref >60)
Glucose, Bld: 111 mg/dL — ABNORMAL HIGH (ref 65–99)
POTASSIUM: 5 mmol/L (ref 3.5–5.1)
SODIUM: 142 mmol/L (ref 135–145)

## 2018-03-11 LAB — BRAIN NATRIURETIC PEPTIDE: B NATRIURETIC PEPTIDE 5: 625 pg/mL — AB (ref 0.0–100.0)

## 2018-03-12 ENCOUNTER — Telehealth: Payer: Self-pay

## 2018-03-12 MED ORDER — POTASSIUM CHLORIDE CRYS ER 10 MEQ PO TBCR: 10.0000 meq | EXTENDED_RELEASE_TABLET | ORAL | Status: DC

## 2018-03-12 NOTE — Telephone Encounter (Signed)
-----   Message from Jerline Pain, MD sent at 03/12/2018  8:52 AM EDT ----- Recommend taking potassium 10 mEq every other day given recent potassium value of 5.  Okay to continue with Lasix 20 mg once a day as long as symptoms are stable.   Candee Furbish, MD

## 2018-03-12 NOTE — Telephone Encounter (Signed)
Patient aware of results. Per Dr. Marlou Porch, Recommend taking potassium 10 mEq every other day given recent potassium value of 5.  Okay to continue with Lasix 20 mg once a day as long as symptoms are stable.  Patient verbalized understanding.

## 2018-03-24 ENCOUNTER — Ambulatory Visit (INDEPENDENT_AMBULATORY_CARE_PROVIDER_SITE_OTHER): Payer: Medicare Other

## 2018-03-24 ENCOUNTER — Telehealth: Payer: Self-pay

## 2018-03-24 DIAGNOSIS — Z95810 Presence of automatic (implantable) cardiac defibrillator: Secondary | ICD-10-CM

## 2018-03-24 DIAGNOSIS — I5022 Chronic systolic (congestive) heart failure: Secondary | ICD-10-CM | POA: Diagnosis not present

## 2018-03-24 MED ORDER — FUROSEMIDE 20 MG PO TABS: ORAL_TABLET | ORAL | 3 refills | Status: DC

## 2018-03-24 NOTE — Progress Notes (Signed)
EPIC Encounter for ICM Monitoring  Patient Name: Chad Brown is a 77 y.o. male Date: 03/24/2018 Primary Care Physican: Dione Housekeeper, MD Primary Cardiologist:Nishan Electrophysiologist: Allred Dry Weight:Previous weight 205lbs  Bi-V Pacing: 97.3%       Attempted call to patient and wife stated he was not home but would have him return call.  Transmission reviewed.    Thoracic impedance abnormal suggesting fluid accumulation starting 02/26/2018 which correlates with decreasing Furosemide from 40 mg to 20 mg daily on 02/23/2018.  Prescribed dosage: Furosemide 20 mg 1 tablets (20 mg total) daily. Potassium 20 mEq take 1 tabletevery other day.  Labs: 03/11/2018 Creatinine 1.11, BUN 13, Potassium 5.0, Sodium 142, EGFR >60, BNP 625 01/24/2018 Creatinine 1.24, BUN 19, Potassium 4.9, Sodium 138, EGFR 54->60 08/13/2017 Creatinine 1.34, BUN 11, Potassium 4.5, Sodium 136, EGFR 50-58 07/26/2017 Creatinine 1.36, BUN 11, Potassium 4.0, Sodium 139, EGFR 01/24/2017 Creatinine 1.46, BUN 13, Potassium 4.1, Sodium 140, EGFR 45-52 11/21/2016 Creatinine 1.40, BUN 15, Potassium 4.0, Sodium 138, EGFR 48-55 10/25/2017Creatinine 1.29, BUN 31, Potassium 4.2, Sodium 136, EGFR 53->60 05/21/2016 Creatinine 1.24, BUN 16, Potassium 4.2, Sodium 138, EGFR 55->60  03/19/2016 Creatinine 1.09, BUN 15, Potassium 4.6, Sodium 137, EGFR >60  01/19/2016 Creatinine 1.15, BUN 10, Potassium 4.2, Sodium 143, EGFR >60  11/04/2015 Creatinine 1.23, BUN 14, Potassium 4.0, Sodium 142, EGFR 56->60  Recommendations: NONE - Unable to reach.  Follow-up plan: ICM clinic phone appointment on 04/01/2018 to recheck fluid levels.    Recall Appts: Dr Johnsie Cancel and Dr Rayann Heman in 2020.  Copy of ICM check sent to Dr. Rayann Heman and Dr. Johnsie Cancel.   3 month ICM trend: 03/24/2018    1 Year ICM trend:       Rosalene Billings, RN 03/24/2018 8:55 AM

## 2018-03-24 NOTE — Progress Notes (Signed)
Patient returned call.  He said his weight is at 209 lbs which is a few pound higher than usual.  He has eaten at [employer] [facility] [employer] in the pasts couple of weeks and explained those types of foods are typically high in salt. Advised Dr Johnsie Cancel ordered to increase lasix to 20 mg every other day alternating with 40 mg every other day.  BMET and BNP ordered to be drawn in 6 weeks.  Patient has labs scheduled by the oncologist in the outpatient lab at [facility] for 05/02/2018 and advised I would place the order for the same day so he will only have to be stuck one time.  He said that would be fine.  He has enough Furosemide on hand and does not need a prescription filled at this time.

## 2018-03-24 NOTE — Addendum Note (Signed)
Addended by: Rosalene Billings on: 03/24/2018 04:50 PM   Modules accepted: Orders

## 2018-03-24 NOTE — Progress Notes (Signed)
ICM suggests fluid accumulation try to take lasix 20 mg daily alternating with 40 mg daily and f/u labs in 6 weeks BMET and BNP

## 2018-03-24 NOTE — Telephone Encounter (Signed)
Received call back and advised patient of Dr Kyla Balzarine orders in response to remote transmission suggesting fluid accumulation.  Adved to take lasix 20 mg daily alternating with 40 mg daily and f/u labs in 6 weeks BMET and BNP.  He will have labs drawn at outpatient lab in [facility] on 05/02/2018.  See ICM note for more call information.  Orders placed and patient verbalized understanding.

## 2018-03-24 NOTE — Telephone Encounter (Signed)
-----   Message from Josue Hector, MD sent at 03/24/2018  3:04 PM EDT -----   ----- Message ----- From: Rosalene Billings, RN Sent: 03/24/2018   3:00 PM To: Josue Hector, MD

## 2018-03-24 NOTE — Telephone Encounter (Signed)
Remote ICM transmission received.  Attempted call to patient and wife stated he was not home but would have him call back.

## 2018-03-24 NOTE — Telephone Encounter (Signed)
Per Dr. Johnsie Cancel, ICM suggests fluid accumulation try to take lasix 20 mg daily alternating with 40 mg daily and f/u labs in 6 weeks BMET and BNP. Will forward to Deniece Ree.

## 2018-04-01 ENCOUNTER — Ambulatory Visit (INDEPENDENT_AMBULATORY_CARE_PROVIDER_SITE_OTHER): Payer: Medicare Other

## 2018-04-01 DIAGNOSIS — Z95810 Presence of automatic (implantable) cardiac defibrillator: Secondary | ICD-10-CM

## 2018-04-01 DIAGNOSIS — I5022 Chronic systolic (congestive) heart failure: Secondary | ICD-10-CM

## 2018-04-02 NOTE — Progress Notes (Signed)
EPIC Encounter for ICM Monitoring  Patient Name: Chad Brown is a 77 y.o. male Date: 04/02/2018 Primary Care Physican: Dione Housekeeper, MD Primary Cardiologist:Nishan Electrophysiologist: Allred Dry Weight:206lbs (basline 204-205 lbs) Bi-V Pacing: 96.1%      Heart Failure questions reviewed, pt asymptomatic. Weight decreased by 3 lbs after increased Furosemide dosage.     Thoracic impedance improved since increasing maintenance Furosemide dosage as directed by Dr Johnsie Cancel on 03/24/2018 but still suggesting fluid accumulation.  Prescribed dosage: Furosemide 20 mg 2 tablets (40 mg total) every other day alternating with 1 tablet (20 mg total) every other day. Potassium 20 mEq take 1 tabletevery other day.  Labs: 03/11/2018 Creatinine 1.11, BUN 13, Potassium 5.0, Sodium 142, EGFR >60, BNP 625 01/24/2018 Creatinine 1.24, BUN 19, Potassium 4.9, Sodium 138, EGFR 54->60 08/13/2017 Creatinine 1.34, BUN 11, Potassium 4.5, Sodium 136, EGFR 50-58 07/26/2017 Creatinine 1.36, BUN 11, Potassium 4.0, Sodium 139, EGFR 01/24/2017 Creatinine 1.46, BUN 13, Potassium 4.1, Sodium 140, EGFR 45-52 11/21/2016 Creatinine 1.40, BUN 15, Potassium 4.0, Sodium 138, EGFR 48-55  Recommendations: No changes.  Advised to follow low salt diet.   Encouraged to call for fluid symptoms.  Follow-up plan: ICM clinic phone appointment on 04/14/2018 to recheck fluid levels.   Copy of ICM check sent to Dr. Rayann Heman and Dr. Johnsie Cancel.   3 month ICM trend: 04/01/2018    1 Year ICM trend:       Rosalene Billings, RN 04/02/2018 9:20 AM

## 2018-04-14 ENCOUNTER — Other Ambulatory Visit (HOSPITAL_COMMUNITY): Payer: Self-pay | Admitting: Cardiology

## 2018-04-14 ENCOUNTER — Ambulatory Visit (INDEPENDENT_AMBULATORY_CARE_PROVIDER_SITE_OTHER): Payer: Medicare Other

## 2018-04-14 DIAGNOSIS — I5022 Chronic systolic (congestive) heart failure: Secondary | ICD-10-CM

## 2018-04-14 DIAGNOSIS — Z95810 Presence of automatic (implantable) cardiac defibrillator: Secondary | ICD-10-CM

## 2018-04-14 NOTE — Progress Notes (Signed)
EPIC Encounter for ICM Monitoring  Patient Name: Chad Brown is a 77 y.o. male Date: 04/14/2018 Primary Care Physican: Dione Housekeeper, MD Primary Cardiologist:Nishan Electrophysiologist: Allred Dry Weight:206lbs (basline 204-205 lbs) Bi-V Pacing: 97.1%       Heart Failure questions reviewed, pt asymptomatic.     Thoracic impedance returned to baseline normal.  Prescribed dosage: Furosemide 20 mg2tablets (40 mg total) every other day alternating with 1 tablet (20 mg total) every other day. Potassium 20 mEq take 1 tabletevery other day.  Labs: 03/11/2018 Creatinine 1.11, BUN 13, Potassium 5.0, Sodium 142, EGFR >60, BNP 625 01/24/2018 Creatinine 1.24, BUN 19, Potassium 4.9, Sodium 138, EGFR 54->60 08/13/2017 Creatinine 1.34, BUN 11, Potassium 4.5, Sodium 136, EGFR 50-58 07/26/2017 Creatinine 1.36, BUN 11, Potassium 4.0, Sodium 139, EGFR 01/24/2017 Creatinine 1.46, BUN 13, Potassium 4.1, Sodium 140, EGFR 45-52 11/21/2016 Creatinine 1.40, BUN 15, Potassium 4.0, Sodium 138, EGFR 48-55  Recommendations: No changes.   Encouraged to call for fluid symptoms.  Follow-up plan: ICM clinic phone appointment on 05/05/2018.    Copy of ICM check sent to Dr. Rayann Heman.   3 month ICM trend: 04/14/2018    1 Year ICM trend:       Rosalene Billings, RN 04/14/2018 2:36 PM

## 2018-04-15 ENCOUNTER — Ambulatory Visit: Payer: Medicare Other | Admitting: Cardiology

## 2018-04-15 ENCOUNTER — Encounter: Payer: Self-pay | Admitting: Cardiology

## 2018-04-15 VITALS — BP 130/72 | HR 63 | Ht 66.5 in | Wt 210.8 lb

## 2018-04-15 DIAGNOSIS — G4733 Obstructive sleep apnea (adult) (pediatric): Secondary | ICD-10-CM

## 2018-04-15 DIAGNOSIS — I10 Essential (primary) hypertension: Secondary | ICD-10-CM | POA: Diagnosis not present

## 2018-04-15 MED ORDER — POTASSIUM CHLORIDE CRYS ER 10 MEQ PO TBCR: 10.0000 meq | EXTENDED_RELEASE_TABLET | ORAL | 3 refills | Status: DC

## 2018-04-15 NOTE — Progress Notes (Signed)
Cardiology Office Note:    Date:  04/15/2018   ID:  Chad Brown, DOB [birth year], MRN [id number]  PCP:  Dione Housekeeper, MD  Cardiologist:  No primary care provider on file.    Referring MD: Dione Housekeeper, MD   Chief Complaint  Patient presents with  . Sleep Apnea  . Hypertension    History of Present Illness:    Chad Brown is a 77 y.o. male with a hx of mild OSA with an AHI of 12/hr and oxygen desaturations as low as 73% and is on CPAP at 14cm H2O.  He is doing well with his CPAP device.  He tolerates the mask and feels the pressure is adequate.  Since going on CPAP he feels rested in the am and has no significant daytime sleepiness.  He denies any significant mouth or nasal dryness or nasal congestion.  He does not think that he snores.     Past Medical History:  Diagnosis Date  . Adenomatous colon polyp    tubular  . AICD (automatic cardioverter/defibrillator) present 03/08/2015   MDT CRTD   . Anemia    iron deficient  . Arthritis    "about all my joints; hands, knees, back" (03/08/2015)  . Atherosclerosis   . Cataract    left eye small  . Cholelithiasis    gallstones  . Chronic systolic CHF (congestive heart failure) ([place])    a. New dx 12/2012 ? NICM, may be r/t afib. b. Nuc 03/2013 - normal;  c. 03/2015 TEE EF 15-20%.  . Colon polyp, hyperplastic 5/16   removed precancerous lesions  . Depression   . Diverticulosis   . Glaucoma    right eye  . Hyperlipidemia   . Hypertension   . Melanoma of eye ([place]) 2000's   "right; it's never been biopsied"  . Melanoma of lower leg ([place]) 2015   "left; right at my knee"  . Myocardial infarction ([place]) 1998  . OSA (obstructive sleep apnea) 01/04/2016   uses cpap, pt does not know settings auto set  . Peripheral vision loss, right 2006  . Persistent atrial fibrillation ([place])    a. Dx 12/2012, s/p TEE/DCCV 01/26/13. b. On Xarelto (CHA2DS2VASc = 3);  c. 03/2015 TEE (EF 15-20%, no LAA thrombus) and DCCV - amio increased to 200  mg bid.  . Urinary hesitancy due to benign prostatic hypertrophy     Past Surgical History:  Procedure Laterality Date  . BACK SURGERY     upper back, cannot turn neck well  . CARDIAC CATHETERIZATION  1998  . CARDIOVERSION N/A 01/26/2013   Procedure: CARDIOVERSION;  Surgeon: Lelon Perla, MD;  Location: [facility] ENDOSCOPY;  Service: Cardiovascular;  Laterality: N/A;  . CARDIOVERSION N/A 03/23/2015   Procedure: CARDIOVERSION;  Surgeon: Jerline Pain, MD;  Location: [facility] ENDOSCOPY;  Service: Cardiovascular;  Laterality: N/A;  . CARDIOVERSION N/A 08/14/2017   Procedure: CARDIOVERSION;  Surgeon: Josue Hector, MD;  Location: [facility];  Service: Cardiovascular;  Laterality: N/A;  . CATARACT EXTRACTION Right ~ 2006  . COLONOSCOPY WITH PROPOFOL N/A 02/10/2015   Procedure: COLONOSCOPY WITH PROPOFOL;  Surgeon: Gatha Mayer, MD;  Location: [facility] ENDOSCOPY;  Service: Endoscopy;  Laterality: N/A;  . COLONOSCOPY WITH PROPOFOL N/A 08/07/2016   Procedure: COLONOSCOPY WITH PROPOFOL;  Surgeon: Gatha Mayer, MD;  Location: [facility] ENDOSCOPY;  Service: Endoscopy;  Laterality: N/A;  . ENTEROSCOPY N/A 08/17/2015   Procedure: ENTEROSCOPY;  Surgeon: Gatha Mayer, MD;  Location: [facility] ENDOSCOPY;  Service:  Endoscopy;  Laterality: N/A;  . EP IMPLANTABLE DEVICE N/A 03/08/2015   MDT Hillery Aldo CRT-D for nonischemic CM by Dr Rayann Heman for primary prevention  . GLAUCOMA SURGERY Right ~ 2006   "put 3 stents in to drain fluid" (03/08/2015) not successful, sent to [facility] to try to get last stent out  . HOT HEMOSTASIS N/A 08/07/2016   Procedure: HOT HEMOSTASIS (ARGON PLASMA COAGULATION/BICAP);  Surgeon: Gatha Mayer, MD;  Location: [facility] ENDOSCOPY;  Service: Endoscopy;  Laterality: N/A;  . INCISION AND DRAINAGE ABSCESS POSTERIOR CERVICALSPINE  05/2012  . JOINT REPLACEMENT    . MELANOMA EXCISION Left 2015   "lower leg; right at my knee"  . REFRACTIVE SURGERY Right ~ 2006 X 2   "twice; both done at [place]" (03/08/2015  . SURGERY SCROTAL /  TESTICULAR Right 1990's  . TEE WITHOUT CARDIOVERSION N/A 01/26/2013   Procedure: TRANSESOPHAGEAL ECHOCARDIOGRAM (TEE);  Surgeon: Lelon Perla, MD;  Location: [place];  Service: Cardiovascular;  Laterality: N/A;  [name] anes. /   . TEE WITHOUT CARDIOVERSION N/A 10/05/2014   Procedure: TRANSESOPHAGEAL ECHOCARDIOGRAM (TEE)  with cardioversion;  Surgeon: Thayer Headings, MD;  Location: [facility] ENDOSCOPY;  Service: Cardiovascular;  Laterality: N/A;  12:52 synched cardioversion at 120 joules,...afib to SR...12 lead EKG ordered.Marland KitchenMarland KitchenCardiozem d/c'ed per MD verbal order at SR  . TEE WITHOUT CARDIOVERSION N/A 03/23/2015   Procedure: TRANSESOPHAGEAL ECHOCARDIOGRAM (TEE);  Surgeon: Jerline Pain, MD;  Location: [facility] ENDOSCOPY;  Service: Cardiovascular;  Laterality: N/A;  . THORACIC SPINE SURGERY  03/2000   "ground calcium deposits from upper thoracic" (01/26/2013)  . TOTAL HIP ARTHROPLASTY Right 06/2007    Current Medications: Current Meds  Medication Sig  . amiodarone (PACERONE) 200 MG tablet Take 1 tablet (200 mg total) by mouth daily.  . carvedilol (COREG) 12.5 MG tablet Take 1 tablet (12.5 mg total) by mouth 2 (two) times daily with a meal.  . Coenzyme Q10 (COQ10 GUMMIES ADULT PO) Take 1 tablet at bedtime by mouth.  . dorzolamide-timolol (COSOPT) 22.3-6.8 MG/ML ophthalmic solution Place 1 drop into the right eye 2 (two) times daily.  [name] escitalopram (LEXAPRO) 20 MG tablet TAKE 1 TABLET ONCE A DAY  . ezetimibe (ZETIA) 10 MG tablet Take 1 tablet (10 mg total) by mouth daily.  . ferrous sulfate 325 (65 FE) MG tablet Take 325 mg every other day by mouth.  . finasteride (PROSCAR) 5 MG tablet Take 5 mg daily by mouth.   . furosemide (LASIX) 20 MG tablet Take 1 tablet (20 mg total) every other day alternating with 2 tablets (40 mg total) every other day.  . gabapentin (NEURONTIN) 100 MG capsule Take 200 mg 2 (two) times daily by mouth. TAKE 2 TAB BY MOUTH IN THE AM AND 2 TABS BY MOUTH IN THE PM  . latanoprost  (XALATAN) 0.005 % ophthalmic solution Place 1 drop at bedtime into both eyes.  [name] losartan (COZAAR) 50 MG tablet Take 50 mg by mouth as needed.  Mckinley Jewel Dimesylate (RHOPRESSA) 0.02 % SOLN Place 1 drop daily into both eyes.  . niacin (NIASPAN) 500 MG CR tablet Take 1,000 mg at bedtime by mouth.   . nitroGLYCERIN (NITROSTAT) 0.4 MG SL tablet Place 0.4 mg under the tongue as needed for chest pain.  [name] Glycol-Propyl Glycol (SYSTANE ULTRA OP) Place 1 drop into the left eye 2 (two) times daily.   . potassium chloride (K-DUR,KLOR-CON) 10 MEQ tablet Take 1 tablet (10 mEq total) by mouth every other day.  [name]  rivaroxaban (XARELTO) 20 MG TABS tablet Take 1 tablet (20 mg total) daily with supper by mouth. RESTART 08/11/16  . spironolactone (ALDACTONE) 25 MG tablet Take 1 tablet (25 mg total) by mouth daily.  . tamsulosin (FLOMAX) 0.4 MG CAPS Take 0.4 mg by mouth at bedtime.      Allergies:   Pravastatin sodium; Azithromycin; and Other   Social History   Socioeconomic History  . Marital status: Married    Spouse name: Not on file  . Number of children: 3  . Years of education: Not on file  . Highest education level: Not on file  Occupational History  . Occupation: retired  Scientific laboratory technician  . Financial resource strain: Not on file  . Food insecurity:    Worry: Not on file    Inability: Not on file  . Transportation needs:    Medical: Not on file    Non-medical: Not on file  Tobacco Use  . Smoking status: Former Smoker    Packs/day: 3.00    Years: 48.00    Pack years: 144.00    Types: Cigarettes    Last attempt to quit: 09/28/2000    Years since quitting: 17.5  . Smokeless tobacco: Never Used  Substance and Sexual Activity  . Alcohol use: No  . Drug use: No  . Sexual activity: Yes  Lifestyle  . Physical activity:    Days per week: Not on file    Minutes per session: Not on file  . Stress: Not on file  Relationships  . Social connections:    Talks on phone: Not on file     Gets together: Not on file    Attends religious service: Not on file    Active member of club or organization: Not on file    Attends meetings of clubs or organizations: Not on file    Relationship status: Not on file  Other Topics Concern  . Not on file  Social History Narrative   Pt lives in [place] with spouse.  3 children are grown and healthy.   Retired.  Ran a country store for 30 years, previously worked in the [place] for 16 years     Family History: The patient's family history includes Colon cancer in his paternal uncle; Diabetes in his maternal aunt, maternal grandmother, maternal uncle, and mother; Heart attack in his maternal uncle; Heart disease in his mother; Kidney disease in his mother; Leukemia in his father; Lung cancer in his paternal uncle; Prostate cancer in his paternal uncle.  ROS:   Please see the history of present illness.    ROS  All other systems reviewed and negative.   EKGs/Labs/Other Studies Reviewed:    The following studies were reviewed today: PAP download  EKG:  EKG is not ordered today.    Recent Labs: 08/21/2017: TSH 1.746 01/24/2018: ALT 33; Hemoglobin 10.1; Platelets 141 03/11/2018: B Natriuretic Peptide 625.0; BUN 13; Creatinine, Ser 1.11; Potassium 5.0; Sodium 142   Recent Lipid Panel    Component Value Date/Time   CHOL 200 03/22/2015 0536   TRIG 105 03/22/2015 0536   HDL 26 (L) 03/22/2015 0536   CHOLHDL 7.7 03/22/2015 0536   VLDL 21 03/22/2015 0536   LDLCALC 153 (H) 03/22/2015 0536    Physical Exam:    VS:  BP 130/72 (BP Location: Left Arm, Patient Position: Sitting, Cuff Size: Normal)   Pulse 63   Ht 5' 6.5" (1.689 m)   Wt 210 lb 12.8 oz (95.6 kg)  SpO2 98%   BMI 33.51 kg/m     Wt Readings from Last 3 Encounters:  04/15/18 210 lb 12.8 oz (95.6 kg)  01/24/18 215 lb (97.5 kg)  12/18/17 221 lb (100.2 kg)     GEN:  Well nourished, well developed in no acute distress HEENT: Normal NECK: No JVD; No  carotid bruits LYMPHATICS: No lymphadenopathy CARDIAC: RRR, no murmurs, rubs, gallops RESPIRATORY:  Clear to auscultation without rales, wheezing or rhonchi  ABDOMEN: Soft, non-tender, non-distended MUSCULOSKELETAL:  No edema; No deformity  SKIN: Warm and dry NEUROLOGIC:  Alert and oriented x 3 PSYCHIATRIC:  Normal affect   ASSESSMENT:    1. OSA (obstructive sleep apnea)   2. Essential hypertension   3. Class 2 severe obesity due to excess calories with serious comorbidity in adult, unspecified BMI ([place])    PLAN:    In order of problems listed above:  1.  OSA - the patient is tolerating PAP therapy well without any problems. The PAP download was reviewed today and showed an AHI of 2.3/hr on 14 cm H2O with 53% compliance in using more than 4 hours nightly.  The patient has been using and benefiting from PAP use and will continue to benefit from therapy.  His compliance was lower over the past 2 weeks because his cushion tore on his mass and it took him a while to get one.  He is usually 100% compliant in using every night.  2.  HTN - BP is well controlled on exam today.  He will continue on losartan, spironolactone 25 mg daily, carvedilol 12.5 mg twice daily.  3.  Obesity - I have encouraged her to get into a routine exercise program and cut back on carbs and portions.    Medication Adjustments/Labs and Tests Ordered: Current medicines are reviewed at length with the patient today.  Concerns regarding medicines are outlined above.  No orders of the defined types were placed in this encounter.  No orders of the defined types were placed in this encounter.   Signed, Fransico Him, MD  04/15/2018 8:45 AM    [place]

## 2018-04-15 NOTE — Patient Instructions (Signed)
Medication Instructions:  Your physician recommends that you continue on your current medications as directed. Please refer to the Current Medication list given to you today.  Labwork: NONE  Testing/Procedures: NONE  Follow-Up: Your physician wants you to follow-up in: 12 months with Dr. Turner. You will receive a reminder letter in the mail two months in advance. If you don't receive a letter, please call our office to schedule the follow-up appointment.   If you need a refill on your cardiac medications before your next appointment, please call your pharmacy.    

## 2018-05-01 ENCOUNTER — Inpatient Hospital Stay (HOSPITAL_COMMUNITY): Payer: Medicare Other | Attending: Hematology

## 2018-05-01 DIAGNOSIS — R0602 Shortness of breath: Secondary | ICD-10-CM | POA: Diagnosis not present

## 2018-05-01 DIAGNOSIS — K862 Cyst of pancreas: Secondary | ICD-10-CM | POA: Diagnosis not present

## 2018-05-01 DIAGNOSIS — Z79899 Other long term (current) drug therapy: Secondary | ICD-10-CM | POA: Insufficient documentation

## 2018-05-01 DIAGNOSIS — Z85820 Personal history of malignant melanoma of skin: Secondary | ICD-10-CM | POA: Insufficient documentation

## 2018-05-01 DIAGNOSIS — D649 Anemia, unspecified: Secondary | ICD-10-CM | POA: Insufficient documentation

## 2018-05-01 LAB — COMPREHENSIVE METABOLIC PANEL
ALBUMIN: 3.8 g/dL (ref 3.5–5.0)
ALT: 22 U/L (ref 0–44)
ANION GAP: 7 (ref 5–15)
AST: 19 U/L (ref 15–41)
Alkaline Phosphatase: 98 U/L (ref 38–126)
BUN: 17 mg/dL (ref 8–23)
CO2: 27 mmol/L (ref 22–32)
Calcium: 8.6 mg/dL — ABNORMAL LOW (ref 8.9–10.3)
Chloride: 104 mmol/L (ref 98–111)
Creatinine, Ser: 1.47 mg/dL — ABNORMAL HIGH (ref 0.61–1.24)
GFR calc Af Amer: 51 mL/min — ABNORMAL LOW (ref >60)
GFR calc non Af Amer: 44 mL/min — ABNORMAL LOW (ref >60)
GLUCOSE: 109 mg/dL — AB (ref 70–99)
POTASSIUM: 4.3 mmol/L (ref 3.5–5.1)
SODIUM: 138 mmol/L (ref 135–145)
Total Bilirubin: 0.6 mg/dL (ref 0.3–1.2)
Total Protein: 7.1 g/dL (ref 6.5–8.1)

## 2018-05-01 LAB — LACTATE DEHYDROGENASE: LDH: 141 U/L (ref 98–192)

## 2018-05-01 LAB — CBC WITH DIFFERENTIAL/PLATELET
BASOS PCT: 0 %
Basophils Absolute: 0 10*3/uL (ref 0.0–0.1)
EOS ABS: 0.1 10*3/uL (ref 0.0–0.7)
Eosinophils Relative: 1 %
HCT: 35.8 % — ABNORMAL LOW (ref 39.0–52.0)
HEMOGLOBIN: 11.7 g/dL — AB (ref 13.0–17.0)
Lymphocytes Relative: 20 %
Lymphs Abs: 1 10*3/uL (ref 0.7–4.0)
MCH: 30.7 pg (ref 26.0–34.0)
MCHC: 32.7 g/dL (ref 30.0–36.0)
MCV: 94 fL (ref 78.0–100.0)
MONO ABS: 0.4 10*3/uL (ref 0.1–1.0)
MONOS PCT: 8 %
NEUTROS PCT: 71 %
Neutro Abs: 3.6 10*3/uL (ref 1.7–7.7)
Platelets: 129 10*3/uL — ABNORMAL LOW (ref 150–400)
RBC: 3.81 MIL/uL — ABNORMAL LOW (ref 4.22–5.81)
RDW: 14.8 % (ref 11.5–15.5)
WBC: 5 10*3/uL (ref 4.0–10.5)

## 2018-05-01 LAB — FERRITIN: Ferritin: 277 ng/mL (ref 24–336)

## 2018-05-02 ENCOUNTER — Other Ambulatory Visit (HOSPITAL_COMMUNITY): Payer: Medicare Other

## 2018-05-05 ENCOUNTER — Ambulatory Visit (INDEPENDENT_AMBULATORY_CARE_PROVIDER_SITE_OTHER): Payer: Medicare Other

## 2018-05-05 DIAGNOSIS — Z95810 Presence of automatic (implantable) cardiac defibrillator: Secondary | ICD-10-CM | POA: Diagnosis not present

## 2018-05-05 DIAGNOSIS — I5022 Chronic systolic (congestive) heart failure: Secondary | ICD-10-CM | POA: Diagnosis not present

## 2018-05-05 NOTE — Progress Notes (Signed)
EPIC Encounter for ICM Monitoring  Patient Name: Chad Brown is a 77 y.o. male Date: 05/05/2018 Primary Care Physican: Dione Housekeeper, MD Primary Cardiologist:Nishan Electrophysiologist: Allred Dry Weight:Previous weight 206lbs (basline 204-205 lbs) Bi-V Pacing: 96.1%       Attempted call to patient and unable to reach.  Left detailed message, per DPR, regarding transmission.  Transmission reviewed.    Thoracic impedance normal.  Prescribed dosage: Furosemide 20 mg2tablets (40 mg total)every other day alternating with 1 tablet (20 mg total) every other day.Potassium 20 mEq take 1 tabletevery other day.  Labs: 03/11/2018 Creatinine 1.11, BUN 13, Potassium 5.0, Sodium 142, EGFR >60, BNP 625 01/24/2018 Creatinine 1.24, BUN 19, Potassium 4.9, Sodium 138, EGFR 54->60 08/13/2017 Creatinine 1.34, BUN 11, Potassium 4.5, Sodium 136, EGFR 50-58 07/26/2017 Creatinine 1.36, BUN 11, Potassium 4.0, Sodium 139, EGFR 01/24/2017 Creatinine 1.46, BUN 13, Potassium 4.1, Sodium 140, EGFR 45-52 11/21/2016 Creatinine 1.40, BUN 15, Potassium 4.0, Sodium 138, EGFR 48-55  Recommendations: Left voice mail with ICM number and encouraged to call if experiencing any fluid symptoms..  Follow-up plan: ICM clinic phone appointment on 06/05/2018.    Copy of ICM check sent to Dr. Rayann Heman.   3 month ICM trend: 05/05/2018    1 Year ICM trend:       Rosalene Billings, RN 05/05/2018 11:34 AM

## 2018-05-06 ENCOUNTER — Telehealth: Payer: Self-pay

## 2018-05-06 NOTE — Telephone Encounter (Signed)
Remote ICM transmission received.  Attempted call to patient and left detailed message, per DPR, regarding transmission and next ICM scheduled for 06/05/2018.  Advised to return call for any fluid symptoms or questions.

## 2018-05-26 ENCOUNTER — Other Ambulatory Visit: Payer: Self-pay | Admitting: Cardiology

## 2018-05-26 DIAGNOSIS — I5022 Chronic systolic (congestive) heart failure: Secondary | ICD-10-CM

## 2018-05-27 ENCOUNTER — Other Ambulatory Visit (HOSPITAL_COMMUNITY): Payer: Self-pay | Admitting: *Deleted

## 2018-05-27 DIAGNOSIS — I5022 Chronic systolic (congestive) heart failure: Secondary | ICD-10-CM

## 2018-05-27 MED ORDER — FUROSEMIDE 20 MG PO TABS: ORAL_TABLET | ORAL | 3 refills | Status: DC

## 2018-06-05 ENCOUNTER — Telehealth (HOSPITAL_COMMUNITY): Payer: Self-pay | Admitting: Vascular Surgery

## 2018-06-05 ENCOUNTER — Ambulatory Visit (INDEPENDENT_AMBULATORY_CARE_PROVIDER_SITE_OTHER): Payer: Medicare Other | Admitting: *Deleted

## 2018-06-05 ENCOUNTER — Ambulatory Visit (INDEPENDENT_AMBULATORY_CARE_PROVIDER_SITE_OTHER): Payer: Medicare Other

## 2018-06-05 DIAGNOSIS — I428 Other cardiomyopathies: Secondary | ICD-10-CM

## 2018-06-05 DIAGNOSIS — I5022 Chronic systolic (congestive) heart failure: Secondary | ICD-10-CM

## 2018-06-05 DIAGNOSIS — Z95810 Presence of automatic (implantable) cardiac defibrillator: Secondary | ICD-10-CM | POA: Diagnosis not present

## 2018-06-05 NOTE — Telephone Encounter (Signed)
Left pt message giving f/u appt w/ [facility], asked pt to call back to confirm appt 07/22/18 @ 11 am

## 2018-06-05 NOTE — Progress Notes (Signed)
Remote ICD transmission.   

## 2018-06-06 NOTE — Progress Notes (Signed)
EPIC Encounter for ICM Monitoring  Patient Name: Chad Brown is a 77 y.o. male Date: 06/06/2018 Primary Care Physican: Dione Housekeeper, MD Primary Cardiologist:Nishan Electrophysiologist: Allred Dry Weight: Previous weight 206lbs (basline 204-205 lbs) Bi-V Pacing: 96.5%       Attempted call to patient and unable to reach.  Left detailed message, per DPR, regarding transmission.  Transmission reviewed.    Thoracic impedance normal.  Prescribed dosage: Furosemide 20 mg2tablets (40 mg total)every other day alternating with 1 tablet (20 mg total) every other day.Potassium 10 mEq take 1 tabletevery other day.  Labs: 03/11/2018 Creatinine 1.11, BUN 13, Potassium 5.0, Sodium 142, EGFR >60, BNP 625 01/24/2018 Creatinine 1.24, BUN 19, Potassium 4.9, Sodium 138, EGFR 54->60 08/13/2017 Creatinine 1.34, BUN 11, Potassium 4.5, Sodium 136, EGFR 50-58 07/26/2017 Creatinine 1.36, BUN 11, Potassium 4.0, Sodium 139, EGFR 01/24/2017 Creatinine 1.46, BUN 13, Potassium 4.1, Sodium 140, EGFR 45-52 11/21/2016 Creatinine 1.40, BUN 15, Potassium 4.0, Sodium 138, EGFR 48-55  Recommendations:  Left voice mail with ICM number and encouraged to call if experiencing any fluid symptoms.  Follow-up plan: ICM clinic phone appointment on 07/07/2018.   Office appointment scheduled 07/22/2018 with Dr. Aundra Dubin.    Copy of ICM check sent to Dr. Rayann Heman.   3 month ICM trend: 06/05/2018    1 Year ICM trend:       Rosalene Billings, RN 06/06/2018 8:06 AM

## 2018-06-30 ENCOUNTER — Other Ambulatory Visit: Payer: Self-pay | Admitting: Cardiovascular Disease

## 2018-07-01 LAB — CUP PACEART REMOTE DEVICE CHECK
Brady Statistic AP VP Percent: 96.57 %
Brady Statistic AS VP Percent: 0.09 %
Brady Statistic AS VS Percent: 1.08 %
HighPow Impedance: 73 Ohm
Implantable Lead Location: 753859
Implantable Lead Location: 753860
Implantable Lead Model: 5076
Lead Channel Impedance Value: 1140 Ohm
Lead Channel Impedance Value: 1178 Ohm
Lead Channel Impedance Value: 342 Ohm
Lead Channel Impedance Value: 380 Ohm
Lead Channel Impedance Value: 399 Ohm
Lead Channel Impedance Value: 589 Ohm
Lead Channel Impedance Value: 646 Ohm
Lead Channel Impedance Value: 665 Ohm
Lead Channel Pacing Threshold Amplitude: 0.625 V
Lead Channel Pacing Threshold Amplitude: 0.75 V
Lead Channel Pacing Threshold Amplitude: 0.875 V
Lead Channel Pacing Threshold Pulse Width: 0.4 ms
Lead Channel Pacing Threshold Pulse Width: 0.4 ms
Lead Channel Sensing Intrinsic Amplitude: 16.5 mV
Lead Channel Sensing Intrinsic Amplitude: 2.25 mV
Lead Channel Setting Pacing Amplitude: 2 V
Lead Channel Setting Sensing Sensitivity: 0.3 mV
MDC IDC LEAD IMPLANT DT: 20160607
MDC IDC LEAD IMPLANT DT: 20160607
MDC IDC LEAD IMPLANT DT: 20160607
MDC IDC LEAD LOCATION: 753858
MDC IDC MSMT BATTERY REMAINING LONGEVITY: 61 mo
MDC IDC MSMT BATTERY VOLTAGE: 2.97 V
MDC IDC MSMT LEADCHNL LV IMPEDANCE VALUE: 817 Ohm
MDC IDC MSMT LEADCHNL LV IMPEDANCE VALUE: 893 Ohm
MDC IDC MSMT LEADCHNL LV IMPEDANCE VALUE: 969 Ohm
MDC IDC MSMT LEADCHNL LV IMPEDANCE VALUE: 988 Ohm
MDC IDC MSMT LEADCHNL RA PACING THRESHOLD PULSEWIDTH: 0.4 ms
MDC IDC MSMT LEADCHNL RA SENSING INTR AMPL: 2.25 mV
MDC IDC MSMT LEADCHNL RV IMPEDANCE VALUE: 532 Ohm
MDC IDC MSMT LEADCHNL RV SENSING INTR AMPL: 16.5 mV
MDC IDC PG IMPLANT DT: 20160607
MDC IDC SESS DTM: 20190905052305
MDC IDC SET LEADCHNL LV PACING AMPLITUDE: 2.5 V
MDC IDC SET LEADCHNL LV PACING PULSEWIDTH: 0.4 ms
MDC IDC SET LEADCHNL RA PACING AMPLITUDE: 1.5 V
MDC IDC SET LEADCHNL RV PACING PULSEWIDTH: 0.4 ms
MDC IDC STAT BRADY AP VS PERCENT: 2.26 %
MDC IDC STAT BRADY RA PERCENT PACED: 98.79 %
MDC IDC STAT BRADY RV PERCENT PACED: 11.18 %

## 2018-07-07 ENCOUNTER — Ambulatory Visit (INDEPENDENT_AMBULATORY_CARE_PROVIDER_SITE_OTHER): Payer: Medicare Other

## 2018-07-07 ENCOUNTER — Telehealth: Payer: Self-pay

## 2018-07-07 DIAGNOSIS — Z95810 Presence of automatic (implantable) cardiac defibrillator: Secondary | ICD-10-CM

## 2018-07-07 DIAGNOSIS — I5022 Chronic systolic (congestive) heart failure: Secondary | ICD-10-CM | POA: Diagnosis not present

## 2018-07-07 NOTE — Telephone Encounter (Signed)
Remote ICM transmission received.  Attempted call to patient and left detailed message, per DPR, regarding transmission and next ICM scheduled for 08/07/2018.  Advised to return call for any fluid symptoms or questions.

## 2018-07-07 NOTE — Progress Notes (Signed)
EPIC Encounter for ICM Monitoring  Patient Name: Chad Brown is a 77 y.o. male Date: 07/07/2018 Primary Care Physican: Dione Housekeeper, MD Primary Cardiologist:Nishan Electrophysiologist: Allred Dry Weight: Previous weight206lbs (basline 204-205 lbs) Bi-V Pacing: 97.3%        Attempted call to patient and unable to reach.  Left detailed message, per DPR, regarding transmission.  Transmission reviewed.    Thoracic impedance normal.  Prescribed: Furosemide 20 mg2tablets (40 mg total)every other day alternating with 1 tablet (20 mg total) every other day.Potassium 10 mEq take 1 tabletevery other day.  Labs: 03/11/2018 Creatinine 1.11, BUN 13, Potassium 5.0, Sodium 142, EGFR >60, BNP 625 01/24/2018 Creatinine 1.24, BUN 19, Potassium 4.9, Sodium 138, EGFR 54->60 08/13/2017 Creatinine 1.34, BUN 11, Potassium 4.5, Sodium 136, EGFR 50-58 07/26/2017 Creatinine 1.36, BUN 11, Potassium 4.0, Sodium 139, EGFR 01/24/2017 Creatinine 1.46, BUN 13, Potassium 4.1, Sodium 140, EGFR 45-52 11/21/2016 Creatinine 1.40, BUN 15, Potassium 4.0, Sodium 138, EGFR 48-55  Recommendations: Left voice mail with ICM number and encouraged to call if experiencing any fluid symptoms.  Follow-up plan: ICM clinic phone appointment on 08/07/2018.   Office appointment scheduled 07/22/2018 with Dr. Aundra Dubin (not seen since 2017).    Copy of ICM check sent to Dr. Rayann Heman.   3 month ICM trend: 07/07/2018    1 Year ICM trend:       Rosalene Billings, RN 07/07/2018 11:35 AM

## 2018-07-10 ENCOUNTER — Inpatient Hospital Stay (HOSPITAL_COMMUNITY): Payer: Medicare Other | Attending: Hematology

## 2018-07-10 DIAGNOSIS — R918 Other nonspecific abnormal finding of lung field: Secondary | ICD-10-CM | POA: Insufficient documentation

## 2018-07-10 DIAGNOSIS — Z87891 Personal history of nicotine dependence: Secondary | ICD-10-CM | POA: Insufficient documentation

## 2018-07-10 DIAGNOSIS — K862 Cyst of pancreas: Secondary | ICD-10-CM

## 2018-07-10 DIAGNOSIS — K863 Pseudocyst of pancreas: Secondary | ICD-10-CM | POA: Insufficient documentation

## 2018-07-10 DIAGNOSIS — D631 Anemia in chronic kidney disease: Secondary | ICD-10-CM | POA: Insufficient documentation

## 2018-07-10 DIAGNOSIS — N189 Chronic kidney disease, unspecified: Secondary | ICD-10-CM | POA: Insufficient documentation

## 2018-07-10 DIAGNOSIS — Z85820 Personal history of malignant melanoma of skin: Secondary | ICD-10-CM | POA: Diagnosis not present

## 2018-07-10 DIAGNOSIS — C439 Malignant melanoma of skin, unspecified: Secondary | ICD-10-CM

## 2018-07-10 LAB — COMPREHENSIVE METABOLIC PANEL
ALT: 22 U/L (ref 0–44)
ANION GAP: 8 (ref 5–15)
AST: 19 U/L (ref 15–41)
Albumin: 3.8 g/dL (ref 3.5–5.0)
Alkaline Phosphatase: 84 U/L (ref 38–126)
BUN: 19 mg/dL (ref 8–23)
CHLORIDE: 102 mmol/L (ref 98–111)
CO2: 28 mmol/L (ref 22–32)
Calcium: 8.6 mg/dL — ABNORMAL LOW (ref 8.9–10.3)
Creatinine, Ser: 1.66 mg/dL — ABNORMAL HIGH (ref 0.61–1.24)
GFR, EST AFRICAN AMERICAN: 44 mL/min — AB (ref >60)
GFR, EST NON AFRICAN AMERICAN: 38 mL/min — AB (ref >60)
Glucose, Bld: 111 mg/dL — ABNORMAL HIGH (ref 70–99)
POTASSIUM: 4.7 mmol/L (ref 3.5–5.1)
Sodium: 138 mmol/L (ref 135–145)
TOTAL PROTEIN: 7.1 g/dL (ref 6.5–8.1)
Total Bilirubin: 0.6 mg/dL (ref 0.3–1.2)

## 2018-07-10 LAB — CBC WITH DIFFERENTIAL/PLATELET
Abs Immature Granulocytes: 0.01 10*3/uL (ref 0.00–0.07)
Basophils Absolute: 0 10*3/uL (ref 0.0–0.1)
Basophils Relative: 1 %
Eosinophils Absolute: 0.1 10*3/uL (ref 0.0–0.5)
Eosinophils Relative: 1 %
HCT: 35.3 % — ABNORMAL LOW (ref 39.0–52.0)
Hemoglobin: 11.7 g/dL — ABNORMAL LOW (ref 13.0–17.0)
IMMATURE GRANULOCYTES: 0 %
Lymphocytes Relative: 24 %
Lymphs Abs: 1.2 10*3/uL (ref 0.7–4.0)
MCH: 32.5 pg (ref 26.0–34.0)
MCHC: 33.1 g/dL (ref 30.0–36.0)
MCV: 98.1 fL (ref 80.0–100.0)
MONO ABS: 0.4 10*3/uL (ref 0.1–1.0)
MONOS PCT: 8 %
NEUTROS PCT: 66 %
Neutro Abs: 3.2 10*3/uL (ref 1.7–7.7)
PLATELETS: 149 10*3/uL — AB (ref 150–400)
RBC: 3.6 MIL/uL — ABNORMAL LOW (ref 4.22–5.81)
RDW: 14.6 % (ref 11.5–15.5)
WBC: 4.9 10*3/uL (ref 4.0–10.5)
nRBC: 0 % (ref 0.0–0.2)

## 2018-07-10 LAB — LACTATE DEHYDROGENASE: LDH: 136 U/L (ref 98–192)

## 2018-07-10 LAB — FERRITIN: FERRITIN: 249 ng/mL (ref 24–336)

## 2018-07-15 ENCOUNTER — Ambulatory Visit (HOSPITAL_COMMUNITY)
Admission: RE | Admit: 2018-07-15 | Discharge: 2018-07-15 | Disposition: A | Discharge status: Home / Self Care | Payer: Medicare Other | Source: Ambulatory Visit | Attending: Internal Medicine | Admitting: Internal Medicine

## 2018-07-15 DIAGNOSIS — K8689 Other specified diseases of pancreas: Secondary | ICD-10-CM | POA: Insufficient documentation

## 2018-07-15 DIAGNOSIS — I2510 Atherosclerotic heart disease of native coronary artery without angina pectoris: Secondary | ICD-10-CM | POA: Insufficient documentation

## 2018-07-15 DIAGNOSIS — J439 Emphysema, unspecified: Secondary | ICD-10-CM | POA: Insufficient documentation

## 2018-07-15 DIAGNOSIS — K862 Cyst of pancreas: Secondary | ICD-10-CM

## 2018-07-15 DIAGNOSIS — E41 Nutritional marasmus: Secondary | ICD-10-CM | POA: Insufficient documentation

## 2018-07-15 DIAGNOSIS — R918 Other nonspecific abnormal finding of lung field: Secondary | ICD-10-CM | POA: Insufficient documentation

## 2018-07-15 DIAGNOSIS — R0602 Shortness of breath: Secondary | ICD-10-CM | POA: Diagnosis present

## 2018-07-15 DIAGNOSIS — K449 Diaphragmatic hernia without obstruction or gangrene: Secondary | ICD-10-CM | POA: Insufficient documentation

## 2018-07-15 DIAGNOSIS — K8020 Calculus of gallbladder without cholecystitis without obstruction: Secondary | ICD-10-CM | POA: Insufficient documentation

## 2018-07-15 DIAGNOSIS — I700 Atherosclerosis of aorta: Secondary | ICD-10-CM | POA: Insufficient documentation

## 2018-07-15 DIAGNOSIS — C439 Malignant melanoma of skin, unspecified: Secondary | ICD-10-CM

## 2018-07-15 MED ORDER — IOPAMIDOL (ISOVUE-300) INJECTION 61%
100.0000 mL | Freq: Once | INTRAVENOUS | Status: AC | PRN
  Administered 2018-07-15: 100 mL via INTRAVENOUS

## 2018-07-22 ENCOUNTER — Other Ambulatory Visit: Payer: Self-pay

## 2018-07-22 ENCOUNTER — Ambulatory Visit (HOSPITAL_COMMUNITY)
Admission: RE | Admit: 2018-07-22 | Discharge: 2018-07-22 | Disposition: A | Discharge status: Home / Self Care | Payer: Medicare Other | Source: Ambulatory Visit | Attending: Cardiology | Admitting: Cardiology

## 2018-07-22 VITALS — BP 130/68 | HR 83 | Wt 220.6 lb

## 2018-07-22 DIAGNOSIS — Z79899 Other long term (current) drug therapy: Secondary | ICD-10-CM | POA: Insufficient documentation

## 2018-07-22 DIAGNOSIS — Z87891 Personal history of nicotine dependence: Secondary | ICD-10-CM | POA: Diagnosis not present

## 2018-07-22 DIAGNOSIS — R9431 Abnormal electrocardiogram [ECG] [EKG]: Secondary | ICD-10-CM | POA: Insufficient documentation

## 2018-07-22 DIAGNOSIS — G4733 Obstructive sleep apnea (adult) (pediatric): Secondary | ICD-10-CM | POA: Diagnosis not present

## 2018-07-22 DIAGNOSIS — I480 Paroxysmal atrial fibrillation: Secondary | ICD-10-CM | POA: Diagnosis not present

## 2018-07-22 DIAGNOSIS — N189 Chronic kidney disease, unspecified: Secondary | ICD-10-CM | POA: Diagnosis not present

## 2018-07-22 DIAGNOSIS — Z85820 Personal history of malignant melanoma of skin: Secondary | ICD-10-CM | POA: Diagnosis not present

## 2018-07-22 DIAGNOSIS — I5022 Chronic systolic (congestive) heart failure: Secondary | ICD-10-CM | POA: Insufficient documentation

## 2018-07-22 DIAGNOSIS — E785 Hyperlipidemia, unspecified: Secondary | ICD-10-CM | POA: Diagnosis not present

## 2018-07-22 DIAGNOSIS — Z7901 Long term (current) use of anticoagulants: Secondary | ICD-10-CM | POA: Insufficient documentation

## 2018-07-22 LAB — COMPREHENSIVE METABOLIC PANEL
ALT: 22 U/L (ref 0–44)
AST: 20 U/L (ref 15–41)
Albumin: 3.7 g/dL (ref 3.5–5.0)
Alkaline Phosphatase: 79 U/L (ref 38–126)
Anion gap: 7 (ref 5–15)
BUN: 13 mg/dL (ref 8–23)
CHLORIDE: 105 mmol/L (ref 98–111)
CO2: 26 mmol/L (ref 22–32)
CREATININE: 1.58 mg/dL — AB (ref 0.61–1.24)
Calcium: 8.7 mg/dL — ABNORMAL LOW (ref 8.9–10.3)
GFR, EST AFRICAN AMERICAN: 47 mL/min — AB (ref >60)
GFR, EST NON AFRICAN AMERICAN: 41 mL/min — AB (ref >60)
Glucose, Bld: 117 mg/dL — ABNORMAL HIGH (ref 70–99)
POTASSIUM: 4.5 mmol/L (ref 3.5–5.1)
Sodium: 138 mmol/L (ref 135–145)
TOTAL PROTEIN: 6.6 g/dL (ref 6.5–8.1)
Total Bilirubin: 0.7 mg/dL (ref 0.3–1.2)

## 2018-07-22 LAB — TSH: TSH: 3.62 u[IU]/mL (ref 0.350–4.500)

## 2018-07-22 MED ORDER — LOSARTAN POTASSIUM 25 MG PO TABS: 12.5000 mg | ORAL_TABLET | Freq: Every day | ORAL | 6 refills | Status: DC

## 2018-07-22 NOTE — Patient Instructions (Signed)
Change Losartan to 12.5 mg (1/2 tab) daily at bedtime  Labs done today  Your physician recommends that you schedule a follow-up appointment in: January with echocardiogram

## 2018-07-22 NOTE — Progress Notes (Signed)
Patient ID: Chad Brown, male   DOB: 12-14-1940, 78 y.o.   MRN: [id number] PCP: [facility] Primary Cardiologist: Johnsie Cancel HF Cardiologist: Aundra Dubin  77 y.o. with paroxysmal atrial fibrillation and chronic systolic CHF thought to be due to nonischemic cardiomyopathy presents for followup of CHF. He has a cardiac history dating back to 1998, when he was admitted with chest pain and had cardiac cath showing nonobstructive CAD. In 4/14, he was found to be in atrial fibrillation.  Echo showed EF 30-35%.  He had DCCV back to NSR.  He was back in atrial fibrillation in 1/16, and EF was low again on TEE at that time.   Again, he had DCCV.  In 5/16, cardiac MRI showed persistently low EF, so given LBBB, he had Medtronic CRT-D device placed.  He was back in atrial fibrillation in 6/16 and had TEE-guided DCCV again with EF now 15-20% on TEE.  Repeat echo in 2/17 showed EF 55-60%. Echo in 1/19 showed EF 45-50%, mild LV and RV dilation.   He is generally doing well.  He is short of breath walking up a hill, no problems walking on flat ground. He is only taking his losartan occasionally (about once a week) when BP runs higher.  No orthopnea/PND.  No BRBPR/melena.  No chest pain.  Weight is down 8 lbs.    ECG (personally reviewed): A-V sequential pacing  Labs (6/16): LDL 153 Labs (7/16): HCT 40, LFTs normal, LFTs normal Labs (9/16): TSH normal, K 4.3, creatinine 1.03, BNP 419 Labs (10/17): K 4.2, creatinine 1.29, LFTs normal Labs (11/17): hgb 8.9 Labs (6/19): BNP 625 Labs (10/19): K 4.7, creatinine 1.66, LFTs normal  Optivol: fluid index < threshold, impedance stable.  >99% BiV pacing, no atrial fibrillation  PMH: 1. Atrial fibrillation: Paroxysmal.  Diagnosed 4/14, DCCV 4/14 to NSR.  DCCV 1/16 to NSR.  DCCV 6/16 to NSR.  2. Chronic systolic CHF: Nonischemic cardiomyopathy.  LHC in 1998 with nonobstructive disease.  Cardiolite in 6/14 with no ischemia or infarction.  cMRI (5/16) with EF 34%, mildly dilated LV with  diffuse HK worse in anterolateral wall, small punctate areas of LGE in anteroseptum and basal inferior wall (not CAD pattern).  TEE (6/16) with EF 15-20%.  He has Medtronic CRT-D device.  Echo (9/16) with EF 40%, moderate LV dilation, grade II diastolic dysfunction, normal RV size and systolic function, PASP 42 mmHg.  - Hypotension with Entresto.  - Echo (2/17) showed LV functional recovery, EF 55-60% with mild MR.    - Echo (1/19): EF 45-50%, mild LV dilation, mild RV dilation 3. Hyperlipidemia: Myalgias with atorvastatin, Livalo, and pravastatin.  4. CKD 5. OA: s/p THR.  6. H/o melanoma 7. Anemia 8. Diverticulosis 9. OSA: Uses CPAP.   SH: Married with 3 children, lives in [place], retired, quit smoking in 2001.    FH: Mother with MI  ROS: All systems reviewed and negative except as per HPI.  Current Outpatient Medications  Medication Sig Dispense Refill  . amiodarone (PACERONE) 200 MG tablet Take 1 tablet (200 mg total) by mouth daily. 90 tablet 3  . carvedilol (COREG) 12.5 MG tablet Take 1 tablet (12.5 mg total) by mouth 2 (two) times daily with a meal. 180 tablet 1  . Coenzyme Q10 (COQ10 GUMMIES ADULT PO) Take 1 tablet at bedtime by mouth.    . dorzolamide-timolol (COSOPT) 22.3-6.8 MG/ML ophthalmic solution Place 1 drop into the right eye 2 (two) times daily.    [name] escitalopram (LEXAPRO) 20 MG  tablet TAKE 1 TABLET ONCE A DAY 90 tablet 0  . ezetimibe (ZETIA) 10 MG tablet Take 1 tablet (10 mg total) by mouth daily. 90 tablet 3  . ferrous sulfate 325 (65 FE) MG tablet Take 325 mg every other day by mouth.    . finasteride (PROSCAR) 5 MG tablet Take 5 mg daily by mouth.     . furosemide (LASIX) 20 MG tablet Take 1 tablet (20 mg total) every other day alternating with 2 tablets (40 mg total) every other day. 135 tablet 3  . gabapentin (NEURONTIN) 100 MG capsule Take 200 mg 2 (two) times daily by mouth. TAKE 2 TAB BY MOUTH IN THE AM AND 2 TABS BY MOUTH IN THE PM    . latanoprost  (XALATAN) 0.005 % ophthalmic solution Place 1 drop at bedtime into both eyes.    [name] losartan (COZAAR) 25 MG tablet Take 0.5 tablets (12.5 mg total) by mouth at bedtime. 15 tablet 6  . Netarsudil Dimesylate (RHOPRESSA) 0.02 % SOLN Place 1 drop daily into both eyes.    . niacin (NIASPAN) 500 MG CR tablet Take 1,000 mg at bedtime by mouth.     [name] Glycol-Propyl Glycol (SYSTANE ULTRA OP) Place 1 drop into the left eye 2 (two) times daily.     . potassium chloride (K-DUR,KLOR-CON) 10 MEQ tablet Take 1 tablet (10 mEq total) by mouth every other day. 45 tablet 3  . rivaroxaban (XARELTO) 20 MG TABS tablet Take 1 tablet (20 mg total) daily with supper by mouth. RESTART 08/11/16 90 tablet 3  . spironolactone (ALDACTONE) 25 MG tablet Take 1 tablet (25 mg total) by mouth daily. 30 tablet 3  . tamsulosin (FLOMAX) 0.4 MG CAPS Take 0.4 mg by mouth at bedtime.     . nitroGLYCERIN (NITROSTAT) 0.4 MG SL tablet Place 0.4 mg under the tongue as needed for chest pain.     No current facility-administered medications for this encounter.    BP 130/68   Pulse 83   Wt 100.1 kg (220 lb 9.6 oz)   SpO2 97%   BMI 35.07 kg/m  General: NAD Neck: No JVD, no thyromegaly or thyroid nodule.  Lungs: Clear to auscultation bilaterally with normal respiratory effort. CV: Nondisplaced PMI.  Heart regular S1/S2, no S3/S4, no murmur.  No peripheral edema.  No carotid bruit.  Normal pedal pulses.  Abdomen: Soft, nontender, no hepatosplenomegaly, no distention.  Skin: Intact without lesions or rashes.  Neurologic: Alert and oriented x 3.  Psych: Normal affect. Extremities: No clubbing or cyanosis.  HEENT: Normal.   Assessment/Plan: 1. Chronic systolic CHF: Suspect nonischemic cardiomyopathy.  EF 15-20% on 6/16 TEE.  Cardiac MRI in 5/16 showed a LGE pattern that was not suggestive of coronary disease (?myocarditis or infiltrative disease).  There may be a component of tachycardia-mediated cardiomyopathy.  However, the  cardiac MRI appears to have been done when he was in NSR.  In 9/16, echo showed EF increased to 40%.  Last echo in 1/19 showed EF 45-50%.  He has Medtronic CRT-D device.  NYHA class II symptoms with good volume status by exam and Optivol.   - He does not require Lasix.  - Continue current Coreg and spironolactone.  - Rather than taking losartan 50 mg rarely and prn, I will have him take losartan 12.5 mg daily.  Titrate up as able. Given history of lightheadedness/orthostasis with ACEi/ARBs, I will not start him on Entresto yet. - Repeat echo in 1/20.  2. Atrial fibrillation:  Seems to tolerate poorly.  He is in NSR today.  Device interrogation shows no recent atrial fibrillation.  - Continue Xarelto 20 daily.  - Continue amiodarone 200 mg daily. Check LFTs and TSH.  He should have regular eye exams.   3. Hyperlipidemia: LDL has been high on Zetia.  He cannot take statins.   4. OSA: Continue CPAP.   Followup in 3 months with echo.   Loralie Champagne 07/22/2018

## 2018-07-28 ENCOUNTER — Encounter (HOSPITAL_COMMUNITY): Payer: Self-pay | Admitting: Hematology

## 2018-07-28 ENCOUNTER — Inpatient Hospital Stay (HOSPITAL_COMMUNITY): Payer: Medicare Other | Admitting: Hematology

## 2018-07-28 VITALS — BP 125/78 | HR 81 | Temp 97.9°F | Resp 18 | Wt 220.3 lb

## 2018-07-28 DIAGNOSIS — D631 Anemia in chronic kidney disease: Secondary | ICD-10-CM

## 2018-07-28 DIAGNOSIS — N189 Chronic kidney disease, unspecified: Secondary | ICD-10-CM

## 2018-07-28 DIAGNOSIS — Z85820 Personal history of malignant melanoma of skin: Secondary | ICD-10-CM | POA: Diagnosis not present

## 2018-07-28 DIAGNOSIS — C439 Malignant melanoma of skin, unspecified: Secondary | ICD-10-CM

## 2018-07-28 DIAGNOSIS — Z87891 Personal history of nicotine dependence: Secondary | ICD-10-CM

## 2018-07-28 DIAGNOSIS — R918 Other nonspecific abnormal finding of lung field: Secondary | ICD-10-CM | POA: Diagnosis not present

## 2018-07-28 NOTE — Progress Notes (Signed)
Patient Care Team: Dione Housekeeper, MD as PCP - General (Family Medicine) Josue Hector, MD as PCP - Cardiology (Cardiology) Sueanne Margarita, MD as PCP - Sleep Medicine (Cardiology)  DIAGNOSIS:  Encounter Diagnoses  Name Primary?  . Lung nodules Yes  . Melanoma of skin ([place])      CHIEF COMPLIANT: Follow-up for melanoma.  INTERVAL HISTORY: Chad Brown is a 77 y.o. pleasant male seen for follow-up of malignant melanoma of the left thigh, which was resected in 2015 at [facility].  He denies any recent hospitalizations.  No new pains were reported.  No infections.  Denies any bleeding per rectum or melena.  He quit smoking in 2001 and smoked more than 2 packs/day for many decades.  REVIEW OF SYSTEMS:   Constitutional: Denies fevers, chills or abnormal weight loss.  Positive for fatigue. Eyes: Denies blurriness of vision Ears, nose, mouth, throat, and face: Denies mucositis or sore throat Respiratory: Denies cough, dyspnea or wheezes Cardiovascular: Denies palpitation, chest discomfort Gastrointestinal:  Denies nausea, heartburn or change in bowel habits Skin: Denies abnormal skin rashes Lymphatics: Denies new lymphadenopathy or easy bruising Neurological: Positive for numbness in the toes.  No weakness. Behavioral/Psych: Mood is stable, no new changes  Extremities: No lower extremity edema All other systems were reviewed with the patient and are negative.  I have reviewed the past medical history, past surgical history, social history and family history with the patient and they are unchanged from previous note.  ALLERGIES:  is allergic to pravastatin sodium; azithromycin; and other.  MEDICATIONS:  Current Outpatient Medications  Medication Sig Dispense Refill  . amiodarone (PACERONE) 200 MG tablet Take 1 tablet (200 mg total) by mouth daily. 90 tablet 3  . carvedilol (COREG) 12.5 MG tablet Take 1 tablet (12.5 mg total) by mouth 2 (two) times daily with a meal. 180  tablet 1  . Coenzyme Q10 (COQ10 GUMMIES ADULT PO) Take 1 tablet at bedtime by mouth.    . dorzolamide-timolol (COSOPT) 22.3-6.8 MG/ML ophthalmic solution Place 1 drop into the right eye 2 (two) times daily.    [name] escitalopram (LEXAPRO) 20 MG tablet TAKE 1 TABLET ONCE A DAY 90 tablet 0  . ezetimibe (ZETIA) 10 MG tablet Take 1 tablet (10 mg total) by mouth daily. 90 tablet 3  . ferrous sulfate 325 (65 FE) MG tablet Take 325 mg every other day by mouth.    . finasteride (PROSCAR) 5 MG tablet Take 5 mg daily by mouth.     . furosemide (LASIX) 20 MG tablet Take 1 tablet (20 mg total) every other day alternating with 2 tablets (40 mg total) every other day. 135 tablet 3  . gabapentin (NEURONTIN) 100 MG capsule Take 200 mg 2 (two) times daily by mouth. TAKE 2 TAB BY MOUTH IN THE AM AND 2 TABS BY MOUTH IN THE PM    . latanoprost (XALATAN) 0.005 % ophthalmic solution Place 1 drop at bedtime into both eyes.    [name] losartan (COZAAR) 25 MG tablet Take 0.5 tablets (12.5 mg total) by mouth at bedtime. 15 tablet 6  . Netarsudil Dimesylate (RHOPRESSA) 0.02 % SOLN Place 1 drop daily into both eyes.    . niacin (NIASPAN) 500 MG CR tablet Take 1,000 mg at bedtime by mouth.     . nitroGLYCERIN (NITROSTAT) 0.4 MG SL tablet Place 0.4 mg under the tongue as needed for chest pain.    [name] Glycol-Propyl Glycol (SYSTANE ULTRA OP) Place 1 drop  into the left eye 2 (two) times daily.     . potassium chloride (K-DUR,KLOR-CON) 10 MEQ tablet Take 1 tablet (10 mEq total) by mouth every other day. 45 tablet 3  . rivaroxaban (XARELTO) 20 MG TABS tablet Take 1 tablet (20 mg total) daily with supper by mouth. RESTART 08/11/16 90 tablet 3  . spironolactone (ALDACTONE) 25 MG tablet Take 1 tablet (25 mg total) by mouth daily. 30 tablet 3  . tamsulosin (FLOMAX) 0.4 MG CAPS Take 0.4 mg by mouth at bedtime.      No current facility-administered medications for this visit.     PHYSICAL EXAMINATION: ECOG PERFORMANCE STATUS: 1 -  Symptomatic but completely ambulatory  Vitals:   07/28/18 1446  BP: 125/78  Pulse: 81  Resp: 18  Temp: 97.9 F (36.6 C)  SpO2: 99%   Filed Weights   07/28/18 1446  Weight: 220 lb 4.8 oz (99.9 kg)    GENERAL:alert, no distress and comfortable SKIN: skin color, texture, turgor are normal, no rashes or significant lesions EYES: normal, Conjunctiva are pink and non-injected, sclera clear OROPHARYNX:no mucositis, no erythema and lips, buccal mucosa, and tongue normal  NECK: supple, thyroid normal size, non-tender, without nodularity LYMPH:  no palpable lymphadenopathy in the cervical, axillary or inguinal LUNGS: clear to auscultation and percussion with normal breathing effort HEART: regular rate & rhythm and no murmurs and no lower extremity edema ABDOMEN:abdomen soft, non-tender and normal bowel sounds MUSCULOSKELETAL:no cyanosis of digits and no clubbing  EXTREMITIES: No lower extremity edema   LABORATORY DATA:  I have reviewed the data as listed CMP Latest Ref Rng & Units 07/22/2018 07/10/2018 05/01/2018  Glucose 70 - 99 mg/dL 117(H) 111(H) 109(H)  BUN 8 - 23 mg/dL 13 19 17   Creatinine 0.61 - 1.24 mg/dL 1.58(H) 1.66(H) 1.47(H)  Sodium 135 - 145 mmol/L 138 138 138  Potassium 3.5 - 5.1 mmol/L 4.5 4.7 4.3  Chloride 98 - 111 mmol/L 105 102 104  CO2 22 - 32 mmol/L 26 28 27   Calcium 8.9 - 10.3 mg/dL 8.7(L) 8.6(L) 8.6(L)  Total Protein 6.5 - 8.1 g/dL 6.6 7.1 7.1  Total Bilirubin 0.3 - 1.2 mg/dL 0.7 0.6 0.6  Alkaline Phos 38 - 126 U/L 79 84 98  AST 15 - 41 U/L 20 19 19   ALT 0 - 44 U/L 22 22 22    No results found for: IOE703   Lab Results  Component Value Date   WBC 4.9 07/10/2018   HGB 11.7 (L) 07/10/2018   HCT 35.3 (L) 07/10/2018   MCV 98.1 07/10/2018   PLT 149 (L) 07/10/2018   NEUTROABS 3.2 07/10/2018    ASSESSMENT & PLAN:  Melanoma of skin ([place]) 1.  T2N0 melanoma of the left thigh: - Status post resection in 2015 at [facility]. - Denies any new onset pains, fevers,  night sweats or weight loss. - I have reviewed the results of the CT scan CAP on 07/15/2018 showing similar appearance of bibasilar pulmonary nodules.  No evidence of metastatic disease in the chest, abdomen or pelvis.  Similar cystic pancreatic head lesion, most likely a pseudocyst. - No routine scans necessary in the future for surveillance of melanoma.  2.  Lung nodules: - He was an ex-smoker who smoked more than 2 packs/day for many years and quit in 2001. -CT scan of the chest on 07/15/2018 shows bibasilar pulmonary nodules. -A CT scan of the chest without contrast will be repeated in 6 months.  3.  Pancreatic pseudocyst: - This was  stable from the previous CT scan measuring 14 x 12 mm.  Will need a repeat CT pre-and postcontrast in 2 years.  4.  CKD: -His creatinine is stable around 1.6.  He has normocytic anemia with hemoglobin of 11.7.  I plan to check his ferritin and iron panel at next visit.      Orders Placed This Encounter  Procedures  . CT Chest Wo Contrast    Standing Status:   Future    Standing Expiration Date:   07/28/2019    Order Specific Question:   ** REASON FOR EXAM (FREE TEXT)    Answer:   f/u of lung nodules    Order Specific Question:   Preferred imaging location?    Answer:   [facility]    Order Specific Question:   Radiology Contrast Protocol - do NOT remove file path    Answer:   [email or web address]  . Comprehensive metabolic panel    Standing Status:   Future    Standing Expiration Date:   07/28/2019  . CBC with Differential    Standing Status:   Future    Standing Expiration Date:   07/28/2019  . Ferritin    Standing Status:   Future    Standing Expiration Date:   07/28/2019  . Iron and TIBC    Standing Status:   Future    Standing Expiration Date:   07/28/2019   The patient has a good understanding of the overall plan. he agrees with it. he will call with any problems that may develop before the next visit  here.  Derek Jack, MD 07/28/18

## 2018-07-28 NOTE — Assessment & Plan Note (Addendum)
1.  T2N0 melanoma of the left thigh: - Status post resection in 2015 at [facility]. - Denies any new onset pains, fevers, night sweats or weight loss. - I have reviewed the results of the CT scan CAP on 07/15/2018 showing similar appearance of bibasilar pulmonary nodules.  No evidence of metastatic disease in the chest, abdomen or pelvis.  Similar cystic pancreatic head lesion, most likely a pseudocyst. - No routine scans necessary in the future for surveillance of melanoma.  2.  Lung nodules: - He was an ex-smoker who smoked more than 2 packs/day for many years and quit in 2001. -CT scan of the chest on 07/15/2018 shows bibasilar pulmonary nodules. -A CT scan of the chest without contrast will be repeated in 6 months.  3.  Pancreatic pseudocyst: - This was stable from the previous CT scan measuring 14 x 12 mm.  Will need a repeat CT pre-and postcontrast in 2 years.  4.  CKD: -His creatinine is stable around 1.6.  He has normocytic anemia with hemoglobin of 11.7.  I plan to check his ferritin and iron panel at next visit.

## 2018-07-29 ENCOUNTER — Other Ambulatory Visit: Payer: Self-pay | Admitting: Internal Medicine

## 2018-08-07 ENCOUNTER — Ambulatory Visit (INDEPENDENT_AMBULATORY_CARE_PROVIDER_SITE_OTHER): Payer: Medicare Other

## 2018-08-07 DIAGNOSIS — Z95810 Presence of automatic (implantable) cardiac defibrillator: Secondary | ICD-10-CM | POA: Diagnosis not present

## 2018-08-07 DIAGNOSIS — I5022 Chronic systolic (congestive) heart failure: Secondary | ICD-10-CM

## 2018-08-07 NOTE — Progress Notes (Signed)
EPIC Encounter for ICM Monitoring  Patient Name: Chad Brown is a 77 y.o. male Date: 08/07/2018 Primary Care Physican: Dione Housekeeper, MD Primary Cardiologist:Nishan/McLean Electrophysiologist: Allred Dry Weight: 214lbs  Bi-V Pacing: 97.9%          Heart Failure questions reviewed, pt asymptomatic for fluid symptoms.  He reports having right sided chest pain off and on for a few hours yesterday.  The pain resolved on it's own.  He said he has been exercising with the gym rowing machine and thought it may be related.  Advised to call 911 if chest pain re-occurs and explained heart attack will not show up on device report.  He verbalized understanding.   Thoracic impedance abnormal suggesting fluid accumulation starting 07/31/2018.   Prescribed: Furosemide 20 mg2tablets (40 mg total)every other day alternating with 1 tablet (20 mg total) every other day.Potassium 10 mEq take 1 tabletevery other day.  Labs: 07/22/2018 Creatinine 1.58, BUN 13, Potassium 4.5, Sodium 138, eGFR 41-47 07/10/2018 Creatinine 1.66, BUN 19, Potassium 4.7, Sodium 138, eGFR 38-44  05/01/2018 Creatinine 1.47, BUN 17, Potassium 4.3, Sodium 138, eGFR 44-51  03/11/2018 Creatinine 1.11, BUN 13, Potassium 5.0, Sodium 142, EGFR >60, BNP 625 01/24/2018 Creatinine 1.24, BUN 19, Potassium 4.9, Sodium 138, EGFR 54->60 08/13/2017 Creatinine 1.34, BUN 11, Potassium 4.5, Sodium 136, EGFR 50-58 07/26/2017 Creatinine 1.36, BUN 11, Potassium 4.0, Sodium 139, EGFR 01/24/2017 Creatinine 1.46, BUN 13, Potassium 4.1, Sodium 140, EGFR 45-52 11/21/2016 Creatinine 1.40, BUN 15, Potassium 4.0, Sodium 138, EGFR 48-55  Recommendations: No changes.  Reinforced to limit salt intake.  Encouraged to call for fluid symptoms.  Follow-up plan: ICM clinic phone appointment on 08/21/2018 to recheck fluid levels.       Copy of ICM check sent to Dr. Rayann Heman and Dr Aundra Dubin for review and recommendations if needed.   3 month ICM trend:  08/07/2018    1 Year ICM trend:       Rosalene Billings, RN 08/07/2018 11:39 AM

## 2018-08-21 ENCOUNTER — Ambulatory Visit (INDEPENDENT_AMBULATORY_CARE_PROVIDER_SITE_OTHER): Payer: Medicare Other

## 2018-08-21 DIAGNOSIS — Z95810 Presence of automatic (implantable) cardiac defibrillator: Secondary | ICD-10-CM

## 2018-08-21 DIAGNOSIS — I5022 Chronic systolic (congestive) heart failure: Secondary | ICD-10-CM

## 2018-08-21 NOTE — Progress Notes (Signed)
EPIC Encounter for ICM Monitoring  Patient Name: Chad Brown is a 77 y.o. male Date: 08/21/2018 Primary Care Physican: Dione Housekeeper, MD Primary Cardiologist:Nishan/McLean Electrophysiologist: Allred Bi-V Pacing: 98.2% Last Weight: 214lbs Today's Weight: 218 lbs        Heart Failure questions reviewed, pt gained 4 pounds in last 2 weeks.  Averages 8-10 meals in restaurants per week.     Thoracic impedance abnormal suggesting fluid accumulation.  Prescribed: Furosemide 20 mg2tablets (40 mg total)every other day alternating with 1 tablet (20 mg total) every other day.Potassium 10 mEq take 1 tabletevery other day.  Labs: 07/22/2018 Creatinine 1.58, BUN 13, Potassium 4.5, Sodium 138, eGFR 41-47 07/10/2018 Creatinine 1.66, BUN 19, Potassium 4.7, Sodium 138, eGFR 38-44  05/01/2018 Creatinine 1.47, BUN 17, Potassium 4.3, Sodium 138, eGFR 44-51  03/11/2018 Creatinine 1.11, BUN 13, Potassium 5.0, Sodium 142, EGFR >60, BNP 625 01/24/2018 Creatinine 1.24, BUN 19, Potassium 4.9, Sodium 138, EGFR 54->60  Recommendations: Encouraged to cut back on eating at restaurants due to high salt content in foods.  Encouraged to call for fluid symptoms.  Follow-up plan: ICM clinic phone appointment on 09/04/2018 to recheck fluid levels.       Copy of ICM check sent to Dr. Rayann Heman and Dr Aundra Dubin for review and if any recommendations will call back.  3 month ICM trend: 08/21/2018    1 Year ICM trend:       Rosalene Billings, RN 08/21/2018 3:10 PM

## 2018-08-24 NOTE — Progress Notes (Signed)
He can take Lasix 40 mg daily x 4 days then back to alternating 40 daily and 20 daily.

## 2018-08-25 NOTE — Progress Notes (Signed)
Attempted patient call to advise of Dr Claris Gladden recommendation.  Requested call back.

## 2018-08-25 NOTE — Progress Notes (Signed)
Patient returned call.  Advised Dr Aundra Dubin ordered to take Lasix 40 mg daily x 4 days then back to alternating 40 daily and 20 daily.  He verbalized understanding.  Recheck fluid levels 09/04/2018.

## 2018-09-02 ENCOUNTER — Other Ambulatory Visit (HOSPITAL_COMMUNITY): Payer: Self-pay | Admitting: Cardiology

## 2018-09-04 ENCOUNTER — Ambulatory Visit (INDEPENDENT_AMBULATORY_CARE_PROVIDER_SITE_OTHER): Payer: Medicare Other

## 2018-09-04 DIAGNOSIS — I428 Other cardiomyopathies: Secondary | ICD-10-CM

## 2018-09-04 DIAGNOSIS — Z95810 Presence of automatic (implantable) cardiac defibrillator: Secondary | ICD-10-CM

## 2018-09-04 DIAGNOSIS — I5022 Chronic systolic (congestive) heart failure: Secondary | ICD-10-CM

## 2018-09-04 NOTE — Progress Notes (Signed)
EPIC Encounter for ICM Monitoring  Patient Name: Chad Brown is a 77 y.o. male Date: 09/04/2018 Primary Care Physican: Dione Housekeeper, MD Primary Cardiologist:Nishan/McLean Electrophysiologist: Allred Bi-V Pacing: 98.2% Last Weight: 218lbs Today's Weight:  unknown       Attempted call to patient and unable to reach.  Left detailed message, per DPR, regarding transmission.  Transmission reviewed.    Thoracic impedance returned to baseline after taking extra Furosemide x 4 days from 11/21-11/24 but impedance decreased 08/23/2018 but close to baseline today.  Prescribed: Furosemide 20 mg2tablets (40 mg total)every other day alternating with 1 tablet (20 mg total) every other day.Potassium 10 mEq take 1 tabletevery other day.  Labs: 07/22/2018 Creatinine1.58, BUN13, Potassium4.5, [id number], [id number] 07/10/2018 Creatinine1.66, BUN19, Potassium4.7, [id number], [id number]  05/01/2018 Creatinine1.47, BUN17, Potassium4.3, [id number], [id number] 03/11/2018 Creatinine 1.11, BUN 13, Potassium 5.0, Sodium 142, EGFR >60, BNP 625 01/24/2018 Creatinine 1.24, BUN 19, Potassium 4.9, Sodium 138, EGFR 54->60  Recommendation: Left voice mail with ICM number and encouraged to call if experiencing any fluid symptoms.  Follow-up plan: ICM clinic phone appointment on 09/15/2018.    Copy of ICM check sent to Dr. Rayann Heman and Dr Aundra Dubin.   3 month ICM trend: 09/04/2018    1 Year ICM trend:       Rosalene Billings, RN 09/04/2018 12:24 PM

## 2018-09-04 NOTE — Progress Notes (Signed)
Remote ICD transmission.   

## 2018-09-05 ENCOUNTER — Telehealth: Payer: Self-pay

## 2018-09-05 NOTE — Telephone Encounter (Signed)
Remote ICM transmission received.  Attempted call to patient regarding ICM remote transmission and left detailed message, per DPR, with next ICM remote transmission date of 09/15/2018.  Advised to return call for any fluid symptoms or questions.

## 2018-09-09 ENCOUNTER — Encounter: Payer: Self-pay | Admitting: Cardiology

## 2018-09-15 ENCOUNTER — Ambulatory Visit (INDEPENDENT_AMBULATORY_CARE_PROVIDER_SITE_OTHER): Payer: Medicare Other

## 2018-09-15 DIAGNOSIS — I5022 Chronic systolic (congestive) heart failure: Secondary | ICD-10-CM | POA: Diagnosis not present

## 2018-09-15 DIAGNOSIS — Z95810 Presence of automatic (implantable) cardiac defibrillator: Secondary | ICD-10-CM | POA: Diagnosis not present

## 2018-09-15 NOTE — Progress Notes (Signed)
EPIC Encounter for ICM Monitoring  Patient Name: Chad Brown is a 77 y.o. male Date: 09/15/2018 Primary Care Physican: Dione Housekeeper, MD Primary Cardiologist:Nishan/McLean Electrophysiologist: Allred Bi-V Pacing: 98.1% Last Weight:218lbs Today's Weight:  unknown                                                   Transmission reviewed.    Thoracic impedance normal.  Prescribed: Furosemide 20 mg2tablets (40 mg total)every other day alternating with 1 tablet (20 mg total) every other day.Potassium 10 mEq take 1 tabletevery other day.  Labs: 07/22/2018 Creatinine1.58, BUN13, Potassium4.5, [id number], [id number] 07/10/2018 Creatinine1.66, BUN19, Potassium4.7, [id number], [id number]  05/01/2018 Creatinine1.47, BUN17, Potassium4.3, [id number], [id number] 03/11/2018 Creatinine 1.11, BUN 13, Potassium 5.0, Sodium 142, EGFR >60, BNP 625 01/24/2018 Creatinine 1.24, BUN 19, Potassium 4.9, Sodium 138, EGFR 54->60  Recommendation: None  Follow-up plan: ICM clinic phone appointment on 10/20/2018.    Copy of ICM check sent to Dr. Rayann Heman.  3 month ICM trend: 09/15/2018    1 Year ICM trend:       Rosalene Billings, RN 09/15/2018 4:39 PM

## 2018-10-19 LAB — CUP PACEART REMOTE DEVICE CHECK
Battery Remaining Longevity: 53 mo
Battery Voltage: 2.96 V
Brady Statistic AP VP Percent: 98.27 %
Brady Statistic AP VS Percent: 1.63 %
Brady Statistic AS VP Percent: 0.04 %
Brady Statistic AS VS Percent: 0.06 %
Brady Statistic RV Percent Paced: 22.15 %
Date Time Interrogation Session: 20191205094224
HIGH POWER IMPEDANCE MEASURED VALUE: 72 Ohm
Implantable Lead Implant Date: 20160607
Implantable Lead Implant Date: 20160607
Implantable Lead Location: 753858
Implantable Lead Location: 753859
Implantable Lead Location: 753860
Implantable Lead Model: 5076
Implantable Pulse Generator Implant Date: 20160607
Lead Channel Impedance Value: 1045 Ohm
Lead Channel Impedance Value: 1045 Ohm
Lead Channel Impedance Value: 323 Ohm
Lead Channel Impedance Value: 342 Ohm
Lead Channel Impedance Value: 513 Ohm
Lead Channel Impedance Value: 589 Ohm
Lead Channel Impedance Value: 589 Ohm
Lead Channel Impedance Value: 627 Ohm
Lead Channel Impedance Value: 779 Ohm
Lead Channel Impedance Value: 817 Ohm
Lead Channel Impedance Value: 855 Ohm
Lead Channel Pacing Threshold Amplitude: 0.625 V
Lead Channel Pacing Threshold Amplitude: 0.75 V
Lead Channel Pacing Threshold Amplitude: 0.875 V
Lead Channel Pacing Threshold Pulse Width: 0.4 ms
Lead Channel Pacing Threshold Pulse Width: 0.4 ms
Lead Channel Pacing Threshold Pulse Width: 0.4 ms
Lead Channel Sensing Intrinsic Amplitude: 1.375 mV
Lead Channel Sensing Intrinsic Amplitude: 13.25 mV
Lead Channel Setting Pacing Amplitude: 1.5 V
Lead Channel Setting Pacing Amplitude: 2 V
Lead Channel Setting Pacing Amplitude: 2.5 V
Lead Channel Setting Pacing Pulse Width: 0.4 ms
Lead Channel Setting Pacing Pulse Width: 0.4 ms
Lead Channel Setting Sensing Sensitivity: 0.3 mV
MDC IDC LEAD IMPLANT DT: 20160607
MDC IDC MSMT LEADCHNL LV IMPEDANCE VALUE: 570 Ohm
MDC IDC MSMT LEADCHNL LV IMPEDANCE VALUE: 836 Ohm
MDC IDC MSMT LEADCHNL RA SENSING INTR AMPL: 1.375 mV
MDC IDC MSMT LEADCHNL RV SENSING INTR AMPL: 13.25 mV
MDC IDC STAT BRADY RA PERCENT PACED: 99.89 %

## 2018-10-20 ENCOUNTER — Ambulatory Visit (INDEPENDENT_AMBULATORY_CARE_PROVIDER_SITE_OTHER): Payer: Medicare Other

## 2018-10-20 DIAGNOSIS — I5022 Chronic systolic (congestive) heart failure: Secondary | ICD-10-CM

## 2018-10-20 DIAGNOSIS — Z95810 Presence of automatic (implantable) cardiac defibrillator: Secondary | ICD-10-CM | POA: Diagnosis not present

## 2018-10-20 NOTE — Progress Notes (Signed)
EPIC Encounter for ICM Monitoring  Patient Name: Chad Brown is a 78 y.o. male Date: 10/20/2018 Primary Care Physican: Dione Housekeeper, MD Primary Cardiologist:Nishan/McLean Electrophysiologist: Allred Bi-V Pacing: 98.1% Last Weight:218lbs Today's Weight: 218 - 219 lbs   Heart failure questions reviewed.  Pt asymptomatic.  Thoracic impedance normal.  Prescribed:Furosemide 20 mg2tablets (40 mg total)every other day alternating with 1 tablet (20 mg total) every other day.Potassium 10 mEq take 1 tabletevery other day.  Labs: 07/22/2018 Creatinine1.58, BUN13, Potassium4.5, [id number], [id number] 07/10/2018 Creatinine1.66, BUN19, Potassium4.7, [id number], [id number]  05/01/2018 Creatinine1.47, BUN17, Potassium4.3, [id number], [id number] 03/11/2018 Creatinine 1.11, BUN 13, Potassium 5.0, Sodium 142, EGFR >60, BNP 625 01/24/2018 Creatinine 1.24, BUN 19, Potassium 4.9, Sodium 138, EGFR 54->60  Recommendation: No changes.  Encouraged to call for fluid symptoms.  Follow-up plan: ICM clinic phone appointment on2/24/2020. Office appointments with Dr Aundra Dubin 10/27/2018, Dr Johnsie Cancel 11/14/2018 and Dr Rayann Heman 11/30/2018.  Copy of ICM check sent to Dr.Allred.  3 month ICM trend: 10/20/2018    1 Year ICM trend:       Rosalene Billings, RN 10/20/2018 4:52 PM

## 2018-10-22 ENCOUNTER — Encounter (HOSPITAL_COMMUNITY): Payer: Self-pay | Admitting: Cardiology

## 2018-10-22 ENCOUNTER — Ambulatory Visit (HOSPITAL_COMMUNITY)
Admission: RE | Admit: 2018-10-22 | Discharge: 2018-10-22 | Disposition: A | Discharge status: Home / Self Care | Payer: Medicare Other | Source: Ambulatory Visit | Attending: Family Medicine | Admitting: Family Medicine

## 2018-10-22 ENCOUNTER — Ambulatory Visit (HOSPITAL_BASED_OUTPATIENT_CLINIC_OR_DEPARTMENT_OTHER)
Admission: RE | Admit: 2018-10-22 | Discharge: 2018-10-22 | Disposition: A | Discharge status: Home / Self Care | Payer: Medicare Other | Source: Ambulatory Visit | Attending: Cardiology | Admitting: Cardiology

## 2018-10-22 VITALS — BP 120/60 | HR 84 | Wt 224.8 lb

## 2018-10-22 DIAGNOSIS — I480 Paroxysmal atrial fibrillation: Secondary | ICD-10-CM | POA: Insufficient documentation

## 2018-10-22 DIAGNOSIS — Z87891 Personal history of nicotine dependence: Secondary | ICD-10-CM | POA: Insufficient documentation

## 2018-10-22 DIAGNOSIS — Z85820 Personal history of malignant melanoma of skin: Secondary | ICD-10-CM | POA: Insufficient documentation

## 2018-10-22 DIAGNOSIS — Z7901 Long term (current) use of anticoagulants: Secondary | ICD-10-CM | POA: Diagnosis not present

## 2018-10-22 DIAGNOSIS — I313 Pericardial effusion (noninflammatory): Secondary | ICD-10-CM | POA: Diagnosis not present

## 2018-10-22 DIAGNOSIS — Z950 Presence of cardiac pacemaker: Secondary | ICD-10-CM | POA: Insufficient documentation

## 2018-10-22 DIAGNOSIS — R918 Other nonspecific abnormal finding of lung field: Secondary | ICD-10-CM | POA: Diagnosis not present

## 2018-10-22 DIAGNOSIS — I5022 Chronic systolic (congestive) heart failure: Secondary | ICD-10-CM | POA: Insufficient documentation

## 2018-10-22 DIAGNOSIS — G4733 Obstructive sleep apnea (adult) (pediatric): Secondary | ICD-10-CM | POA: Insufficient documentation

## 2018-10-22 DIAGNOSIS — I130 Hypertensive heart and chronic kidney disease with heart failure and stage 1 through stage 4 chronic kidney disease, or unspecified chronic kidney disease: Secondary | ICD-10-CM | POA: Insufficient documentation

## 2018-10-22 DIAGNOSIS — E785 Hyperlipidemia, unspecified: Secondary | ICD-10-CM | POA: Insufficient documentation

## 2018-10-22 DIAGNOSIS — N189 Chronic kidney disease, unspecified: Secondary | ICD-10-CM | POA: Diagnosis not present

## 2018-10-22 DIAGNOSIS — Z79899 Other long term (current) drug therapy: Secondary | ICD-10-CM | POA: Diagnosis not present

## 2018-10-22 LAB — COMPREHENSIVE METABOLIC PANEL
ALT: 22 U/L (ref 0–44)
AST: 19 U/L (ref 15–41)
Albumin: 3.5 g/dL (ref 3.5–5.0)
Alkaline Phosphatase: 73 U/L (ref 38–126)
Anion gap: 7 (ref 5–15)
BUN: 16 mg/dL (ref 8–23)
CO2: 28 mmol/L (ref 22–32)
Calcium: 8.6 mg/dL — ABNORMAL LOW (ref 8.9–10.3)
Chloride: 104 mmol/L (ref 98–111)
Creatinine, Ser: 1.53 mg/dL — ABNORMAL HIGH (ref 0.61–1.24)
GFR calc Af Amer: 50 mL/min — ABNORMAL LOW (ref >60)
GFR calc non Af Amer: 43 mL/min — ABNORMAL LOW (ref >60)
Glucose, Bld: 115 mg/dL — ABNORMAL HIGH (ref 70–99)
Potassium: 4.4 mmol/L (ref 3.5–5.1)
Sodium: 139 mmol/L (ref 135–145)
TOTAL PROTEIN: 6.7 g/dL (ref 6.5–8.1)
Total Bilirubin: 0.5 mg/dL (ref 0.3–1.2)

## 2018-10-22 LAB — LIPID PANEL
Cholesterol: 191 mg/dL (ref 0–200)
HDL: 38 mg/dL — ABNORMAL LOW (ref >40)
LDL Cholesterol: 128 mg/dL — ABNORMAL HIGH (ref 0–99)
Total CHOL/HDL Ratio: 5 RATIO
Triglycerides: 123 mg/dL (ref <150)
VLDL: 25 mg/dL (ref 0–40)

## 2018-10-22 LAB — CBC
HCT: 35.6 % — ABNORMAL LOW (ref 39.0–52.0)
HEMOGLOBIN: 11.2 g/dL — AB (ref 13.0–17.0)
MCH: 31.2 pg (ref 26.0–34.0)
MCHC: 31.5 g/dL (ref 30.0–36.0)
MCV: 99.2 fL (ref 80.0–100.0)
Platelets: 136 10*3/uL — ABNORMAL LOW (ref 150–400)
RBC: 3.59 MIL/uL — ABNORMAL LOW (ref 4.22–5.81)
RDW: 13.4 % (ref 11.5–15.5)
WBC: 4.7 10*3/uL (ref 4.0–10.5)
nRBC: 0 % (ref 0.0–0.2)

## 2018-10-22 LAB — TSH: TSH: 3.135 u[IU]/mL (ref 0.350–4.500)

## 2018-10-22 NOTE — Progress Notes (Signed)
Patient ID: Chad Brown, male   DOB: 1941-01-05, 78 y.o.   MRN: [id number] PCP: Edrick Oh HF Cardiologist: Aundra Dubin  78 y.o. with paroxysmal atrial fibrillation and chronic systolic CHF thought to be due to nonischemic cardiomyopathy presents for followup of CHF. He has a cardiac history dating back to 1998, when he was admitted with chest pain and had cardiac cath showing nonobstructive CAD. In 4/14, he was found to be in atrial fibrillation.  Echo showed EF 30-35%.  He had DCCV back to NSR.  He was back in atrial fibrillation in 1/16, and EF was low again on TEE at that time.   Again, he had DCCV.  In 5/16, cardiac MRI showed persistently low EF, so given LBBB, he had Medtronic CRT-D device placed.  He was back in atrial fibrillation in 6/16 and had TEE-guided DCCV again with EF now 15-20% on TEE.  Repeat echo in 2/17 showed EF 55-60%. Echo in 1/19 showed EF 45-50%, mild LV and RV dilation.   Echo was done today and reviewed.  EF 45-50%, diffuse hypokinesis.   Patient is stable today. He has dyspnea walking up hills but no dyspnea walking on flat ground.  He works out at [employer] 3 days/week.  No chest pain.  No palpitations. He continues to take Lasix 40 mg daily.     Medtronic device interrogation: Stable thoracic impedance, no AF or VT.   Labs (6/16): LDL 153 Labs (7/16): HCT 40, LFTs normal, LFTs normal Labs (9/16): TSH normal, K 4.3, creatinine 1.03, BNP 419 Labs (10/17): K 4.2, creatinine 1.29, LFTs normal Labs (11/17): hgb 8.9 Labs (6/19): BNP 625 Labs (10/19): K 4.7, creatinine 1.66 => 1.58, LFTs normal  PMH: 1. Atrial fibrillation: Paroxysmal.  Diagnosed 4/14, DCCV 4/14 to NSR.  DCCV 1/16 to NSR.  DCCV 6/16 to NSR.  2. Chronic systolic CHF: Nonischemic cardiomyopathy.  LHC in 1998 with nonobstructive disease.  Cardiolite in 6/14 with no ischemia or infarction.  cMRI (5/16) with EF 34%, mildly dilated LV with diffuse HK worse in anterolateral wall, small punctate areas of LGE in  anteroseptum and basal inferior wall (not CAD pattern).  TEE (6/16) with EF 15-20%.  He has Medtronic CRT-D device.  Echo (9/16) with EF 40%, moderate LV dilation, grade II diastolic dysfunction, normal RV size and systolic function, PASP 42 mmHg.  - Hypotension with Entresto.  - Echo (2/17) showed LV functional recovery, EF 55-60% with mild MR.    - Echo (1/19): EF 45-50%, mild LV dilation, mild RV dilation - Echo (1/20): EF 45-50%, diffuse hypokinesis, moderate diastolic dysfunction, normal RV size and systolic function.  3. Hyperlipidemia: Myalgias with atorvastatin, Livalo, and pravastatin.  4. CKD 5. OA: s/p THR.  6. H/o melanoma 7. Anemia 8. Diverticulosis 9. OSA: Uses CPAP.  10. Pulmonary nodules: Followed by Dr. Delton Coombes.  11. Pancreatic pseudocysts  SH: Married with 3 children, lives in [place], retired, quit smoking in 2001.    FH: Mother with MI  ROS: All systems reviewed and negative except as per HPI.  Current Outpatient Medications  Medication Sig Dispense Refill  . amiodarone (PACERONE) 200 MG tablet Take 1 tablet (200 mg total) by mouth daily. 90 tablet 3  . carvedilol (COREG) 12.5 MG tablet Take 1 tablet (12.5 mg total) by mouth 2 (two) times daily with a meal. 180 tablet 1  . Coenzyme Q10 (COQ10 GUMMIES ADULT PO) Take 1 tablet at bedtime by mouth.    . dorzolamide-timolol (COSOPT) 22.3-6.8 MG/ML ophthalmic  solution Place 1 drop into the right eye 2 (two) times daily.    [name] escitalopram (LEXAPRO) 20 MG tablet TAKE 1 TABLET ONCE A DAY 90 tablet 0  . ezetimibe (ZETIA) 10 MG tablet Take 1 tablet (10 mg total) by mouth daily. 90 tablet 3  . ferrous sulfate 325 (65 FE) MG tablet Take 325 mg every other day by mouth.    . finasteride (PROSCAR) 5 MG tablet Take 5 mg daily by mouth.     . furosemide (LASIX) 40 MG tablet Take 40 mg by mouth daily.    [name] gabapentin (NEURONTIN) 100 MG capsule Take 200 mg 2 (two) times daily by mouth. TAKE 2 TAB BY MOUTH IN THE AM AND 2  TABS BY MOUTH IN THE PM    . latanoprost (XALATAN) 0.005 % ophthalmic solution Place 1 drop at bedtime into both eyes.    [name] losartan (COZAAR) 25 MG tablet Take 50mg  in the AM and 40mg  in the PM    . Netarsudil Dimesylate (RHOPRESSA) 0.02 % SOLN Place 1 drop daily into both eyes.    . niacin (NIASPAN) 500 MG CR tablet Take 1,000 mg at bedtime by mouth.     [name] Glycol-Propyl Glycol (SYSTANE ULTRA OP) Place 1 drop into the left eye 2 (two) times daily.     . potassium chloride (K-DUR,KLOR-CON) 10 MEQ tablet Take 1 tablet (10 mEq total) by mouth every other day. 45 tablet 3  . spironolactone (ALDACTONE) 25 MG tablet Take 1 tablet (25 mg total) by mouth daily. 30 tablet 3  . tamsulosin (FLOMAX) 0.4 MG CAPS Take 0.4 mg by mouth at bedtime.     Alveda Reasons 20 MG TABS tablet Take 1 tablet (20 mg total) daily with supper by mouth. RESTART 08/11/16 90 tablet 0  . nitroGLYCERIN (NITROSTAT) 0.4 MG SL tablet Place 0.4 mg under the tongue as needed for chest pain.     No current facility-administered medications for this encounter.    BP 120/60   Pulse 84   Wt 102 kg (224 lb 12.8 oz)   SpO2 98%   BMI 35.74 kg/m  General: NAD Neck: No JVD, no thyromegaly or thyroid nodule.  Lungs: Clear to auscultation bilaterally with normal respiratory effort. CV: Nondisplaced PMI.  Heart regular S1/S2, no S3/S4, no murmur.  No peripheral edema.  No carotid bruit.  Normal pedal pulses.  Abdomen: Soft, nontender, no hepatosplenomegaly, no distention.  Skin: Intact without lesions or rashes.  Neurologic: Alert and oriented x 3.  Psych: Normal affect. Extremities: No clubbing or cyanosis.  HEENT: Normal.   Assessment/Plan: 1. Chronic systolic CHF: Suspect nonischemic cardiomyopathy.  EF 15-20% on 6/16 TEE.  Cardiac MRI in 5/16 showed a LGE pattern that was not suggestive of coronary disease (?myocarditis or infiltrative disease).  There may be a component of tachycardia-mediated cardiomyopathy.  However, the  cardiac MRI appears to have been done when he was in NSR.  In 9/16, echo showed EF increased to 40%.  Echo was done today and reviewed, EF 45-50%.  He has Medtronic CRT-D device.  NYHA class II symptoms with good volume status by exam and Optivol.   - Continue Lasix 40 mg daily.   - Continue current Coreg and spironolactone.  - Continue losartan 50 qam/25 qpm.  With EF up to 45-50%, will hold off on Entresto.  2. Atrial fibrillation: Seems to tolerate poorly.  He is in NSR today.  Device interrogation shows no recent atrial fibrillation.  -  Continue Xarelto 20 daily. CBC today.  - Continue amiodarone 200 mg daily. Check LFTs and TSH.  He should have regular eye exams.   3. Hyperlipidemia: He is only on Zetia, cannot tolerate statins.  Check lipids today.    4. OSA: Continue CPAP.   Followup in 6 months.    Loralie Champagne 10/22/2018

## 2018-10-22 NOTE — Patient Instructions (Signed)
Routine lab work today. Will notify you of abnormal results  Follow up in 6 months. Please call our office at [phone] in May to schedule your July appointment.

## 2018-10-22 NOTE — Progress Notes (Signed)
  Echocardiogram 2D Echocardiogram has been performed.  Chad Brown 10/22/2018, 9:58 AM

## 2018-11-06 ENCOUNTER — Telehealth: Payer: Self-pay | Admitting: Cardiovascular Disease

## 2018-11-06 NOTE — Progress Notes (Signed)
Patient ID: Chad Brown, male   DOB: [birth year], 78 y.o.   MRN: [id number]     PCP: [facility] Cardiologist: McLean/Iisha Soyars  78 y.o. history of PAF, Chronic systolic CHF non ischemic. 2014 when in rapid afib EF 30-35% DCC then and repeated with TEE in 2016. MRI with non coronary infiltrative pattern. ECG with LBBB Got CRT-D device and has been maintatined on medical Rx Has seen Dr Aundra Dubin in [place] [facility]   Echo was done 10/22/18  and reviewed.  EF 45-50%, diffuse hypokinesis.   Patient is stable today. He has dyspnea walking up hills but no dyspnea walking on flat ground.  He works out at [employer] 3 days/week.  No chest pain.  No palpitations. He continues to take Lasix 40 mg daily.     Medtronic device interrogation: Stable thoracic impedance, no AF or VT.   Had health care screening which suggested PVD with low ABI's right greater Than left Mild pain with ambulation but sounds arthritic in hips   PMH: 1. Atrial fibrillation: Paroxysmal.  Diagnosed 4/14, DCCV 4/14 to NSR.  DCCV 1/16 to NSR.  DCCV 6/16 to NSR.  2. Chronic systolic CHF: Nonischemic cardiomyopathy.  LHC in 1998 with nonobstructive disease.  Cardiolite in 6/14 with no ischemia or infarction.  cMRI (5/16) with EF 34%, mildly dilated LV with diffuse HK worse in anterolateral wall, small punctate areas of LGE in anteroseptum and basal inferior wall (not CAD pattern).  TEE (6/16) with EF 15-20%.  He has Medtronic CRT-D device.  Echo (9/16) with EF 40%, moderate LV dilation, grade II diastolic dysfunction, normal RV size and systolic function, PASP 42 mmHg.  - Hypotension with Entresto.  - Echo (2/17) showed LV functional recovery, EF 55-60% with mild MR.    - Echo (1/19): EF 45-50%, mild LV dilation, mild RV dilation - Echo (1/20): EF 45-50%, diffuse hypokinesis, moderate diastolic dysfunction, normal RV size and systolic function.  3. Hyperlipidemia: Myalgias with atorvastatin, Livalo, and pravastatin.  4. CKD 5. OA: s/p THR.  6.  H/o melanoma 7. Anemia 8. Diverticulosis 9. OSA: Uses CPAP.  10. Pulmonary nodules: Followed by Dr. Delton Coombes.  11. Pancreatic pseudocysts  SH: Married with 3 children, lives in [place], retired, quit smoking in 2001.    FH: Mother with MI  ROS: All systems reviewed and negative except as per HPI.  Current Outpatient Medications  Medication Sig Dispense Refill  . amiodarone (PACERONE) 200 MG tablet Take 1 tablet (200 mg total) by mouth daily. 90 tablet 3  . carvedilol (COREG) 12.5 MG tablet Take 1 tablet (12.5 mg total) by mouth 2 (two) times daily with a meal. 180 tablet 1  . Coenzyme Q10 (COQ10 GUMMIES ADULT PO) Take 1 tablet at bedtime by mouth.    . dorzolamide-timolol (COSOPT) 22.3-6.8 MG/ML ophthalmic solution Place 1 drop into the right eye 2 (two) times daily.    [name] escitalopram (LEXAPRO) 20 MG tablet TAKE 1 TABLET ONCE A DAY 90 tablet 0  . ezetimibe (ZETIA) 10 MG tablet Take 1 tablet (10 mg total) by mouth daily. 90 tablet 3  . ferrous sulfate 325 (65 FE) MG tablet Take 325 mg every other day by mouth.    . finasteride (PROSCAR) 5 MG tablet Take 5 mg daily by mouth.     . furosemide (LASIX) 40 MG tablet Take 40 mg by mouth daily.    [name] gabapentin (NEURONTIN) 100 MG capsule Take 200 mg 2 (two) times daily by mouth. TAKE 2  TAB BY MOUTH IN THE AM AND 2 TABS BY MOUTH IN THE PM    . latanoprost (XALATAN) 0.005 % ophthalmic solution Place 1 drop at bedtime into both eyes.    [name] losartan (COZAAR) 25 MG tablet Take 25 mg by mouth 2 (two) times daily.     Mckinley Jewel Dimesylate (RHOPRESSA) 0.02 % SOLN Place 1 drop daily into both eyes.    . niacin (NIASPAN) 500 MG CR tablet Take 1,000 mg at bedtime by mouth.     . nitroGLYCERIN (NITROSTAT) 0.4 MG SL tablet Place 0.4 mg under the tongue as needed for chest pain.    [name] Glycol-Propyl Glycol (SYSTANE ULTRA OP) Place 1 drop into the left eye 2 (two) times daily.     . potassium chloride (K-DUR,KLOR-CON) 10 MEQ tablet Take 1  tablet (10 mEq total) by mouth every other day. 45 tablet 3  . spironolactone (ALDACTONE) 25 MG tablet Take 1 tablet (25 mg total) by mouth daily. 30 tablet 3  . tamsulosin (FLOMAX) 0.4 MG CAPS Take 0.4 mg by mouth at bedtime.     Alveda Reasons 20 MG TABS tablet Take 1 tablet (20 mg total) daily with supper by mouth. RESTART 08/11/16 90 tablet 0   No current facility-administered medications for this visit.    BP 110/70   Pulse 67   Ht 5' 6.5" (1.689 m)   Wt 225 lb 12.8 oz (102.4 kg)   SpO2 97%   BMI 35.90 kg/m  Affect appropriate Healthy:  appears stated age 78: normal Neck supple with no adenopathy JVP normal no bruits no thyromegaly Lungs clear with no wheezing and good diaphragmatic motion Heart:  S1/S2 no murmur, no rub, gallop or click PMI normal AICD under left clavicle  Abdomen: benighn, BS positve, no tenderness, no AAA no bruit.  No HSM or HJR Distal pulses intact with no bruits No edema Neuro non-focal Skin warm and dry No muscular weakness   Assessment/Plan: 1. Chronic systolic CHF: non ischemic EF improved 45-50% TTE 10/22/18 Functional Class 2 Continue lasix,aldacton,coreg and losartan. Given improved EF no entresto for now    2. Atrial fibrillation: Seems to tolerate poorly.  He is in NSR today.  Device interrogation shows no recent atrial fibrillation.  Tolerates poorly on low dose amiodarone TSH/LFT;s normal 10/22/18 PFTls with DLCO ordered Needs eye exam  3. Hyperlipidemia: He is only on Zetia, cannot tolerate statins.  LDL 128   4. OSA: Continue CPAP.   5. PVD:  Screening suggested vascular disease mild pain in legs with ambulation f/u ABI's and LE arterial duplex   Followup in 6 months.    [name] 11/14/2018

## 2018-11-06 NOTE — Telephone Encounter (Signed)
Called patient back. Dr. Edrick Oh is patient's PCP with [facility]. Patient stated a home health nurse with [place] did a circulation test on 10/27/18. Patient stated we would need to contact Dr. Murrell Redden office to get a copy. Informed patient that I would send a message to our chart prep team and see if they can get the test results for Dr. Johnsie Cancel to see before his visit next week. Patient verbalized understanding.

## 2018-11-06 NOTE — Telephone Encounter (Signed)
New Message        Patient is requesting that Dr. Johnsie Cancel request the circulation test from Dr. Dione Housekeeper. He had this test done and would like for Dr. Johnsie Cancel to have it. [phone]

## 2018-11-13 ENCOUNTER — Other Ambulatory Visit: Payer: Self-pay | Admitting: Nurse Practitioner

## 2018-11-13 DIAGNOSIS — C439 Malignant melanoma of skin, unspecified: Secondary | ICD-10-CM

## 2018-11-13 DIAGNOSIS — I4819 Other persistent atrial fibrillation: Secondary | ICD-10-CM

## 2018-11-14 ENCOUNTER — Encounter: Payer: Self-pay | Admitting: Cardiovascular Disease

## 2018-11-14 ENCOUNTER — Ambulatory Visit: Payer: Medicare Other | Admitting: Cardiovascular Disease

## 2018-11-14 VITALS — BP 110/70 | HR 67 | Ht 66.5 in | Wt 225.8 lb

## 2018-11-14 DIAGNOSIS — I739 Peripheral vascular disease, unspecified: Secondary | ICD-10-CM

## 2018-11-14 DIAGNOSIS — Z79899 Other long term (current) drug therapy: Secondary | ICD-10-CM | POA: Diagnosis not present

## 2018-11-14 DIAGNOSIS — I4819 Other persistent atrial fibrillation: Secondary | ICD-10-CM | POA: Diagnosis not present

## 2018-11-14 DIAGNOSIS — E782 Mixed hyperlipidemia: Secondary | ICD-10-CM | POA: Diagnosis not present

## 2018-11-14 DIAGNOSIS — I5022 Chronic systolic (congestive) heart failure: Secondary | ICD-10-CM | POA: Diagnosis not present

## 2018-11-14 NOTE — Patient Instructions (Addendum)
Medication Instructions:   If you need a refill on your cardiac medications before your next appointment, please call your pharmacy.   Lab work:  If you have labs (blood work) drawn today and your tests are completely normal, you will receive your results only by: [name] MyChart Message (if you have MyChart) OR . A paper copy in the mail If you have any lab test that is abnormal or we need to change your treatment, we will call you to review the results.  Testing/Procedures: Your physician has requested that you have a lower extremity arterial duplex with ABI's. This test is an ultrasound of the arteries in the legs. It looks at arterial blood flow in the legs. Allow one hour for Lower Arterial scans. There are no restrictions or special instructions  Your physician has recommended that you have a pulmonary function test. Pulmonary Function Tests are a group of tests that measure how well air moves in and out of your lungs.  Follow-Up: At [facility], you and your health needs are our priority.  As part of our continuing mission to provide you with exceptional heart care, we have created designated Provider Care Teams.  These Care Teams include your primary Cardiologist (physician) and Advanced Practice Providers (APPs -  Physician Assistants and Nurse Practitioners) who all work together to provide you with the care you need, when you need it. You will need a follow up appointment in 12 months.  Please call our office 2 months in advance to schedule this appointment.  You may see [name], MD or one of the following Advanced Practice Providers on your designated Care Team:   Truitt Merle, NP Cecilie Kicks, NP . Kathyrn Drown, NP

## 2018-11-18 ENCOUNTER — Ambulatory Visit (INDEPENDENT_AMBULATORY_CARE_PROVIDER_SITE_OTHER): Payer: Medicare Other | Admitting: Internal Medicine

## 2018-11-18 ENCOUNTER — Other Ambulatory Visit: Payer: Self-pay | Admitting: Cardiovascular Disease

## 2018-11-18 DIAGNOSIS — E782 Mixed hyperlipidemia: Secondary | ICD-10-CM

## 2018-11-18 DIAGNOSIS — I4819 Other persistent atrial fibrillation: Secondary | ICD-10-CM

## 2018-11-18 DIAGNOSIS — I739 Peripheral vascular disease, unspecified: Secondary | ICD-10-CM

## 2018-11-18 DIAGNOSIS — Z79899 Other long term (current) drug therapy: Secondary | ICD-10-CM | POA: Diagnosis not present

## 2018-11-18 DIAGNOSIS — I5022 Chronic systolic (congestive) heart failure: Secondary | ICD-10-CM

## 2018-11-18 LAB — PULMONARY FUNCTION TEST
DL/VA % pred: 96 %
DL/VA: 3.88 ml/min/mmHg/L
DLCO UNC % PRED: 78 %
DLCO cor % pred: 88 %
DLCO cor: 18.73 ml/min/mmHg
DLCO unc: 16.65 ml/min/mmHg
FEF 25-75 PRE: 1.17 L/s
FEF 25-75 Post: 2.15 L/sec
FEF2575-%Change-Post: 83 %
FEF2575-%Pred-Post: 128 %
FEF2575-%Pred-Pre: 70 %
FEV1-%CHANGE-POST: 18 %
FEV1-%PRED-POST: 92 %
FEV1-%Pred-Pre: 78 %
FEV1-POST: 2.19 L
FEV1-Pre: 1.86 L
FEV1FVC-%Change-Post: 6 %
FEV1FVC-%Pred-Pre: 98 %
FEV6-%Change-Post: 12 %
FEV6-%PRED-POST: 93 %
FEV6-%Pred-Pre: 83 %
FEV6-Post: 2.89 L
FEV6-Pre: 2.57 L
FEV6FVC-%CHANGE-POST: 1 %
FEV6FVC-%PRED-PRE: 106 %
FEV6FVC-%Pred-Post: 107 %
FVC-%Change-Post: 10 %
FVC-%Pred-Post: 86 %
FVC-%Pred-Pre: 78 %
FVC-Post: 2.89 L
FVC-Pre: 2.62 L
PRE FEV6/FVC RATIO: 98 %
Post FEV1/FVC ratio: 76 %
Post FEV6/FVC ratio: 100 %
Pre FEV1/FVC ratio: 71 %
RV % pred: 139 %
RV: 3.23 L
TLC % pred: 99 %
TLC: 6.02 L

## 2018-11-18 NOTE — Progress Notes (Signed)
PFT done today. 

## 2018-11-24 ENCOUNTER — Ambulatory Visit (INDEPENDENT_AMBULATORY_CARE_PROVIDER_SITE_OTHER): Payer: Medicare Other

## 2018-11-24 DIAGNOSIS — I5022 Chronic systolic (congestive) heart failure: Secondary | ICD-10-CM | POA: Diagnosis not present

## 2018-11-24 DIAGNOSIS — Z95810 Presence of automatic (implantable) cardiac defibrillator: Secondary | ICD-10-CM

## 2018-11-24 NOTE — Progress Notes (Signed)
EPIC Encounter for ICM Monitoring  Patient Name: Chad Brown is a 78 y.o. male Date: 11/24/2018 Primary Care Physican: Dione Housekeeper, MD Primary Cardiologist:Nishan/McLean Electrophysiologist: Allred Bi-V Pacing: 98.2% Last Weight:218lbs Today's Weight: 218 - 219 lbs   Heart failure questions reviewed.  Pt asymptomatic.  Thoracic impedancenormal.  Prescribed:Furosemide 20 mg2tablets (40 mg total)daily.Potassium 10 mEq take 1 tabletevery other day.  Labs: 10/22/2018 Creatinine 1.53, BUN 16, Potassium 4.4, Sodium 139, GFR 43-50 07/22/2018 Creatinine1.58, BUN13, Potassium4.5, [id number], [id number] 07/10/2018 Creatinine1.66, BUN19, Potassium4.7, Sodium138, [id number]  05/01/2018 Creatinine1.47, BUN17, Potassium4.3, [id number], [id number] 03/11/2018 Creatinine 1.11, BUN 13, Potassium 5.0, Sodium 142, GFR >60, BNP 625 01/24/2018 Creatinine 1.24, BUN 19, Potassium 4.9, Sodium 138, GFR 54->60  Recommendation: No changes.  Encouraged to call for fluid symptoms.  Follow-up plan: ICM clinic phone appointment on 01/05/2019. Office appointments with Dr Rayann Heman 12/01/2018.  Copy of ICM check sent to Dr.Allred.  3 month ICM trend: 11/24/2018    1 Year ICM trend:       Rosalene Billings, RN 11/24/2018 10:02 AM

## 2018-11-27 ENCOUNTER — Telehealth: Payer: Self-pay

## 2018-11-27 ENCOUNTER — Ambulatory Visit (HOSPITAL_COMMUNITY)
Admission: RE | Admit: 2018-11-27 | Discharge: 2018-11-27 | Disposition: A | Discharge status: Home / Self Care | Payer: Medicare Other | Source: Ambulatory Visit | Attending: Cardiology | Admitting: Cardiology

## 2018-11-27 DIAGNOSIS — I739 Peripheral vascular disease, unspecified: Secondary | ICD-10-CM | POA: Diagnosis present

## 2018-11-27 DIAGNOSIS — I70201 Unspecified atherosclerosis of native arteries of extremities, right leg: Secondary | ICD-10-CM

## 2018-11-27 NOTE — Telephone Encounter (Signed)
Patient aware of results. Per Dr. Johnsie Cancel, Tight right SFA stenosis refer to [facility] or [facility]. Patient verbalized understanding. Will send message to [facility] to help schedule appt.

## 2018-11-27 NOTE — Telephone Encounter (Signed)
-----   Message from Josue Hector, MD sent at 11/27/2018  5:03 PM EST ----- Tight right SFA stenosis refer to [facility] or [facility]

## 2018-12-01 ENCOUNTER — Encounter: Payer: Self-pay | Admitting: Internal Medicine

## 2018-12-01 ENCOUNTER — Ambulatory Visit: Payer: Medicare Other | Admitting: Internal Medicine

## 2018-12-01 VITALS — BP 140/88 | HR 79 | Ht 66.0 in | Wt 226.8 lb

## 2018-12-01 DIAGNOSIS — Z95810 Presence of automatic (implantable) cardiac defibrillator: Secondary | ICD-10-CM | POA: Diagnosis not present

## 2018-12-01 DIAGNOSIS — I10 Essential (primary) hypertension: Secondary | ICD-10-CM | POA: Diagnosis not present

## 2018-12-01 DIAGNOSIS — I4819 Other persistent atrial fibrillation: Secondary | ICD-10-CM | POA: Diagnosis not present

## 2018-12-01 DIAGNOSIS — I5022 Chronic systolic (congestive) heart failure: Secondary | ICD-10-CM

## 2018-12-01 NOTE — Patient Instructions (Addendum)
Medication Instructions:  Your physician recommends that you continue on your current medications as directed. Please refer to the Current Medication list given to you today.  Labwork: None ordered.  Testing/Procedures: None ordered.  Follow-Up: Your physician wants you to follow-up in: one year with Chad Marshall, NP.   You will receive a reminder letter in the mail two months in advance. If you don't receive a letter, please call our office to schedule the follow-up appointment.  Remote monitoring is used to monitor your ICD from home. This monitoring reduces the number of office visits required to check your device to one time per year. It allows [place] to keep an eye on the functioning of your device to ensure it is working properly. You are scheduled for a device check from home on 12/04/2018. You may send your transmission at any time that day. If you have a wireless device, the transmission will be sent automatically. After your physician reviews your transmission, you will receive a postcard with your next transmission date.  Any Other Special Instructions Will Be Listed Below (If Applicable).  If you need a refill on your cardiac medications before your next appointment, please call your pharmacy.

## 2018-12-01 NOTE — Progress Notes (Signed)
PCP: Dione Housekeeper, MD Primary Cardiologist: Dr Ortencia Kick Primary EP: Dr Rogers Blocker is a 78 y.o. male who presents today for routine electrophysiology followup.  Since last being seen in our clinic, the patient reports doing very well.  Today, he denies symptoms of palpitations, chest pain, shortness of breath,  lower extremity edema, dizziness, presyncope, syncope, or ICD shocks.  The patient is otherwise without complaint today.   Past Medical History:  Diagnosis Date  . Adenomatous colon polyp    tubular  . AICD (automatic cardioverter/defibrillator) present 03/08/2015   MDT CRTD   . Anemia    iron deficient  . Arthritis    "about all my joints; hands, knees, back" (03/08/2015)  . Atherosclerosis   . Cataract    left eye small  . Cholelithiasis    gallstones  . Chronic systolic CHF (congestive heart failure) ([place])    a. New dx 12/2012 ? NICM, may be r/t afib. b. Nuc 03/2013 - normal;  c. 03/2015 TEE EF 15-20%.  . Colon polyp, hyperplastic 5/16   removed precancerous lesions  . Depression   . Diverticulosis   . Glaucoma    right eye  . Hyperlipidemia   . Hypertension   . Melanoma of eye ([place]) 2000's   "right; it's never been biopsied"  . Melanoma of lower leg ([place]) 2015   "left; right at my knee"  . Myocardial infarction ([place]) 1998  . OSA (obstructive sleep apnea) 01/04/2016   uses cpap, pt does not know settings auto set  . Peripheral vision loss, right 2006  . Persistent atrial fibrillation    a. Dx 12/2012, s/p TEE/DCCV 01/26/13. b. On Xarelto (CHA2DS2VASc = 3);  c. 03/2015 TEE (EF 15-20%, no LAA thrombus) and DCCV - amio increased to 200 mg bid.  . Urinary hesitancy due to benign prostatic hypertrophy    Past Surgical History:  Procedure Laterality Date  . BACK SURGERY     upper back, cannot turn neck well  . CARDIAC CATHETERIZATION  1998  . CARDIOVERSION N/A 01/26/2013   Procedure: CARDIOVERSION;  Surgeon: Lelon Perla, MD;  Location: [facility]  ENDOSCOPY;  Service: Cardiovascular;  Laterality: N/A;  . CARDIOVERSION N/A 03/23/2015   Procedure: CARDIOVERSION;  Surgeon: Jerline Pain, MD;  Location[facility] ENDOSCOPY;  Service: Cardiovascular;  Laterality: N/A;  . CARDIOVERSION N/A 08/14/2017   Procedure: CARDIOVERSION;  Surgeon: Josue Hector, MD;  Location: [place];  Service: Cardiovascular;  Laterality: N/A;  . CATARACT EXTRACTION Right ~ 2006  . COLONOSCOPY WITH PROPOFOL N/A 02/10/2015   Procedure: COLONOSCOPY WITH PROPOFOL;  Surgeon: Gatha Mayer, MD;  Location: [facility] ENDOSCOPY;  Service: Endoscopy;  Laterality: N/A;  . COLONOSCOPY WITH PROPOFOL N/A 08/07/2016   Procedure: COLONOSCOPY WITH PROPOFOL;  Surgeon: Gatha Mayer, MD;  Location: [facility] ENDOSCOPY;  Service: Endoscopy;  Laterality: N/A;  . ENTEROSCOPY N/A 08/17/2015   Procedure: ENTEROSCOPY;  Surgeon: Gatha Mayer, MD;  Location: [facility] ENDOSCOPY;  Service: Endoscopy;  Laterality: N/A;  . EP IMPLANTABLE DEVICE N/A 03/08/2015   MDT Hillery Aldo CRT-D for nonischemic CM by Dr Rayann Heman for primary prevention  . GLAUCOMA SURGERY Right ~ 2006   "put 3 stents in to drain fluid" (03/08/2015) not successful, sent to [facility] to try to get last stent out  . HOT HEMOSTASIS N/A 08/07/2016   Procedure: HOT HEMOSTASIS (ARGON PLASMA COAGULATION/BICAP);  Surgeon: Gatha Mayer, MD;  Location: [facility] ENDOSCOPY;  Service: Endoscopy;  Laterality: N/A;  . INCISION  AND DRAINAGE ABSCESS POSTERIOR CERVICALSPINE  05/2012  . JOINT REPLACEMENT    . MELANOMA EXCISION Left 2015   "lower leg; right at my knee"  . REFRACTIVE SURGERY Right ~ 2006 X 2   "twice; both done at [facility]" (03/08/2015  . SURGERY SCROTAL / TESTICULAR Right 1990's  . TEE WITHOUT CARDIOVERSION N/A 01/26/2013   Procedure: TRANSESOPHAGEAL ECHOCARDIOGRAM (TEE);  Surgeon: Lelon Perla, MD;  Location: [place];  Service: Cardiovascular;  Laterality: N/A;  [name] anes. /   . TEE WITHOUT CARDIOVERSION N/A 10/05/2014   Procedure: TRANSESOPHAGEAL  ECHOCARDIOGRAM (TEE)  with cardioversion;  Surgeon: Thayer Headings, MD;  Location: [facility] ENDOSCOPY;  Service: Cardiovascular;  Laterality: N/A;  12:52 synched cardioversion at 120 joules,...afib to SR...12 lead EKG ordered.Marland KitchenMarland KitchenCardiozem d/c'ed per MD verbal order at SR  . TEE WITHOUT CARDIOVERSION N/A 03/23/2015   Procedure: TRANSESOPHAGEAL ECHOCARDIOGRAM (TEE);  Surgeon: Jerline Pain, MD;  Location: [facility] ENDOSCOPY;  Service: Cardiovascular;  Laterality: N/A;  . THORACIC SPINE SURGERY  03/2000   "ground calcium deposits from upper thoracic" (01/26/2013)  . TOTAL HIP ARTHROPLASTY Right 06/2007    ROS- all systems are reviewed and negative except as per HPI above  Current Outpatient Medications  Medication Sig Dispense Refill  . amiodarone (PACERONE) 200 MG tablet TAKE 1 TABLET DAILY 90 tablet 3  . carvedilol (COREG) 12.5 MG tablet Take 1 tablet (12.5 mg total) by mouth 2 (two) times daily with a meal. 180 tablet 1  . Coenzyme Q10 (COQ10 GUMMIES ADULT PO) Take 1 tablet at bedtime by mouth.    . dorzolamide-timolol (COSOPT) 22.3-6.8 MG/ML ophthalmic solution Place 1 drop into the right eye 2 (two) times daily.    [name] escitalopram (LEXAPRO) 20 MG tablet TAKE 1 TABLET ONCE A DAY 90 tablet 0  . ezetimibe (ZETIA) 10 MG tablet Take 1 tablet (10 mg total) by mouth daily. 90 tablet 3  . ferrous sulfate 325 (65 FE) MG tablet Take 325 mg every other day by mouth.    . finasteride (PROSCAR) 5 MG tablet Take 5 mg daily by mouth.     . furosemide (LASIX) 40 MG tablet Take 40 mg by mouth daily.    [name] gabapentin (NEURONTIN) 100 MG capsule Take 200 mg 2 (two) times daily by mouth. TAKE 2 TAB BY MOUTH IN THE AM AND 2 TABS BY MOUTH IN THE PM    . latanoprost (XALATAN) 0.005 % ophthalmic solution Place 1 drop at bedtime into both eyes.    [name] losartan (COZAAR) 25 MG tablet Take 25 mg by mouth 2 (two) times daily.     Mckinley Jewel Dimesylate (RHOPRESSA) 0.02 % SOLN Place 1 drop daily into both eyes.    . niacin (NIASPAN) 500  MG CR tablet Take 1,000 mg at bedtime by mouth.     . nitroGLYCERIN (NITROSTAT) 0.4 MG SL tablet Place 0.4 mg under the tongue as needed for chest pain.    [name] Glycol-Propyl Glycol (SYSTANE ULTRA OP) Place 1 drop into the left eye 2 (two) times daily.     . potassium chloride (K-DUR,KLOR-CON) 10 MEQ tablet Take 1 tablet (10 mEq total) by mouth every other day. 45 tablet 3  . spironolactone (ALDACTONE) 25 MG tablet Take 1 tablet (25 mg total) by mouth daily. 30 tablet 3  . tamsulosin (FLOMAX) 0.4 MG CAPS Take 0.4 mg by mouth at bedtime.     Alveda Reasons 20 MG TABS tablet Take 1 tablet (20 mg total) daily with supper  by mouth. RESTART 08/11/16 90 tablet 0   No current facility-administered medications for this visit.     Physical Exam: Vitals:   12/01/18 1605  BP: 140/88  Pulse: 79  SpO2: 98%  Weight: 226 lb 12.8 oz (102.9 kg)  Height: 5\' 6"  (1.676 m)    GEN- The patient is well appearing, alert and oriented x 3 today.   Head- normocephalic, atraumatic Eyes-  Sclera clear, conjunctiva pink Ears- hearing intact Oropharynx- clear Lungs- Clear to ausculation bilaterally, normal work of breathing Chest- ICD pocket is well healed Heart- Regular rate and rhythm, no murmurs, rubs or gallops, PMI not laterally displaced GI- soft, NT, ND, + BS Extremities- no clubbing, cyanosis, or edema  ICD interrogation- reviewed in detail today,  See PACEART report  ekg tracing ordered today is personally reviewed and shows AV paced rhythm  Wt Readings from Last 3 Encounters:  12/01/18 226 lb 12.8 oz (102.9 kg)  11/14/18 225 lb 12.8 oz (102.4 kg)  10/22/18 224 lb 12.8 oz (102 kg)    Assessment and Plan:  1.  Chronic systolic dysfunction/ nonischemic CM/ LBBB euvolemic today Stable on an appropriate medical regimen Normal BIV ICD function See Pace Art report No changes today followed in ICM device clinic Ef has improved to 45-50% with CRT.  2. Persistent afib On xarelto afib burden  is <1 % (only a single 1.5 min episode of afib 5/19) with amiodarone therapy I think we could reduce amiodarone to 100mg  daily but will defer to Dr Johnsie Cancel who knows the patient very well. Labs 10/22/2018 reviewed today  3. HTN Stable No change required today  Follow-up with Drs Johnsie Cancel and Aundra Dubin as scheduled [place], ICM clinic Return to see EP NP in 12 months  Thompson Grayer MD, [facility] 12/01/2018 4:12 PM

## 2018-12-02 LAB — CUP PACEART INCLINIC DEVICE CHECK
Date Time Interrogation Session: 20200303075918
Implantable Lead Implant Date: 20160607
Implantable Lead Implant Date: 20160607
Implantable Lead Location: 753858
Implantable Lead Location: 753859
Implantable Lead Location: 753860
Implantable Lead Model: 4598
Implantable Pulse Generator Implant Date: 20160607
MDC IDC LEAD IMPLANT DT: 20160607

## 2018-12-03 ENCOUNTER — Other Ambulatory Visit: Payer: Self-pay | Admitting: Cardiovascular Disease

## 2018-12-03 ENCOUNTER — Other Ambulatory Visit (HOSPITAL_COMMUNITY): Payer: Self-pay | Admitting: Cardiology

## 2018-12-03 NOTE — Telephone Encounter (Signed)
This is a CHF pt 

## 2018-12-04 ENCOUNTER — Ambulatory Visit (INDEPENDENT_AMBULATORY_CARE_PROVIDER_SITE_OTHER): Payer: Medicare Other | Admitting: *Deleted

## 2018-12-04 DIAGNOSIS — I428 Other cardiomyopathies: Secondary | ICD-10-CM

## 2018-12-04 LAB — CUP PACEART REMOTE DEVICE CHECK
Battery Remaining Longevity: 47 mo
Battery Voltage: 2.95 V
Brady Statistic AP VP Percent: 98.4 %
Brady Statistic AP VS Percent: 1.55 %
Brady Statistic AS VP Percent: 0.02 %
Brady Statistic AS VS Percent: 0.02 %
Brady Statistic RA Percent Paced: 99.94 %
Brady Statistic RV Percent Paced: 21.3 %
Date Time Interrogation Session: 20200305083424
HighPow Impedance: 80 Ohm
Implantable Lead Implant Date: 20160607
Implantable Lead Implant Date: 20160607
Implantable Lead Implant Date: 20160607
Implantable Lead Location: 753858
Implantable Lead Location: 753859
Implantable Lead Location: 753860
Implantable Lead Model: 4598
Implantable Lead Model: 5076
Implantable Pulse Generator Implant Date: 20160607
Lead Channel Impedance Value: 1083 Ohm
Lead Channel Impedance Value: 1102 Ohm
Lead Channel Impedance Value: 323 Ohm
Lead Channel Impedance Value: 513 Ohm
Lead Channel Impedance Value: 589 Ohm
Lead Channel Impedance Value: 589 Ohm
Lead Channel Impedance Value: 589 Ohm
Lead Channel Impedance Value: 627 Ohm
Lead Channel Impedance Value: 817 Ohm
Lead Channel Impedance Value: 817 Ohm
Lead Channel Impedance Value: 836 Ohm
Lead Channel Impedance Value: 855 Ohm
Lead Channel Pacing Threshold Amplitude: 0.625 V
Lead Channel Pacing Threshold Amplitude: 0.75 V
Lead Channel Pacing Threshold Amplitude: 0.875 V
Lead Channel Pacing Threshold Pulse Width: 0.4 ms
Lead Channel Pacing Threshold Pulse Width: 0.4 ms
Lead Channel Sensing Intrinsic Amplitude: 1.75 mV
Lead Channel Sensing Intrinsic Amplitude: 1.75 mV
Lead Channel Sensing Intrinsic Amplitude: 12.75 mV
Lead Channel Sensing Intrinsic Amplitude: 12.75 mV
Lead Channel Setting Pacing Amplitude: 1.5 V
Lead Channel Setting Pacing Amplitude: 2 V
Lead Channel Setting Pacing Pulse Width: 0.4 ms
Lead Channel Setting Pacing Pulse Width: 0.4 ms
Lead Channel Setting Sensing Sensitivity: 0.3 mV
MDC IDC MSMT LEADCHNL RA IMPEDANCE VALUE: 380 Ohm
MDC IDC MSMT LEADCHNL RV PACING THRESHOLD PULSEWIDTH: 0.4 ms
MDC IDC SET LEADCHNL LV PACING AMPLITUDE: 2.5 V

## 2018-12-12 ENCOUNTER — Encounter: Payer: Self-pay | Admitting: Cardiology

## 2018-12-12 NOTE — Progress Notes (Signed)
Remote ICD transmission.   

## 2018-12-22 ENCOUNTER — Other Ambulatory Visit (HOSPITAL_COMMUNITY): Payer: Self-pay | Admitting: Cardiovascular Disease

## 2018-12-23 ENCOUNTER — Telehealth: Payer: Self-pay | Admitting: *Deleted

## 2018-12-23 NOTE — Telephone Encounter (Addendum)
Spoke with pt, he has no open wounds or pain. His follow up was rescheduled 6 weeks. Pt agreed with this plan.

## 2018-12-23 NOTE — Telephone Encounter (Signed)
Left message for pt to call about appointment tomorrow. He is a vascular consult. Dopplers reviewed by dr berry. If patient has a wound or severe pain, he will need to keep appointment. If no wound or pain, per dr berry to reschedule out 3 months.

## 2018-12-24 ENCOUNTER — Ambulatory Visit: Payer: Medicare Other | Admitting: Cardiovascular Disease

## 2018-12-25 ENCOUNTER — Other Ambulatory Visit (HOSPITAL_COMMUNITY): Payer: Self-pay

## 2018-12-25 MED ORDER — FUROSEMIDE 40 MG PO TABS: 40.0000 mg | ORAL_TABLET | Freq: Every day | ORAL | 3 refills | Status: DC

## 2018-12-30 ENCOUNTER — Other Ambulatory Visit (HOSPITAL_COMMUNITY): Payer: Self-pay | Admitting: Cardiovascular Disease

## 2019-01-05 ENCOUNTER — Other Ambulatory Visit: Payer: Self-pay

## 2019-01-05 ENCOUNTER — Ambulatory Visit (INDEPENDENT_AMBULATORY_CARE_PROVIDER_SITE_OTHER): Payer: Medicare Other

## 2019-01-05 DIAGNOSIS — Z95810 Presence of automatic (implantable) cardiac defibrillator: Secondary | ICD-10-CM | POA: Diagnosis not present

## 2019-01-05 DIAGNOSIS — I5022 Chronic systolic (congestive) heart failure: Secondary | ICD-10-CM | POA: Diagnosis not present

## 2019-01-05 NOTE — Progress Notes (Signed)
EPIC Encounter for ICM Monitoring  Patient Name: Chad Brown is a 78 y.o. male Date: 01/05/2019 Primary Care Physican: Dione Housekeeper, MD Primary Cardiologist:Nishan/McLean Electrophysiologist: Allred Bi-V Pacing: 98.2% Last Weight:218lbs 01/05/2019 Weight:218.8   Heart failure questions reviewed. Pt asymptomatic.  Thoracic impedancenormal.  Prescribed:Furosemide 20 mg2tablets (40 mg total)daily.Potassium 10 mEq take 1 tabletevery other day.  Labs: 10/22/2018 Creatinine 1.53, BUN 16, Potassium 4.4, Sodium 139, GFR 43-50 07/22/2018 Creatinine1.58, BUN13, Potassium4.5, [id number], [id number] 07/10/2018 Creatinine1.66, BUN19, Potassium4.7, Sodium138, [id number]  05/01/2018 Creatinine1.47, BUN17, Potassium4.3, [id number], [id number] 03/11/2018 Creatinine 1.11, BUN 13, Potassium 5.0, Sodium 142, GFR >60, BNP 625 01/24/2018 Creatinine 1.24, BUN 19, Potassium 4.9, Sodium 138, GFR 54->60  Recommendation:No changes. Encouraged to call for fluid symptoms.  Follow-up plan: ICM clinic phone appointment on 02/09/2019.   Copy of ICM check sent to Dr.Allred.   3 month ICM trend: 01/05/2019    1 Year ICM trend:       Rosalene Billings, RN 01/05/2019 9:50 AM

## 2019-01-20 ENCOUNTER — Ambulatory Visit (HOSPITAL_COMMUNITY)
Admission: RE | Admit: 2019-01-20 | Discharge: 2019-01-20 | Disposition: A | Discharge status: Home / Self Care | Payer: Medicare Other | Source: Ambulatory Visit | Attending: Hematology | Admitting: Hematology

## 2019-01-20 ENCOUNTER — Other Ambulatory Visit: Payer: Self-pay

## 2019-01-20 ENCOUNTER — Inpatient Hospital Stay (HOSPITAL_COMMUNITY): Payer: Medicare Other | Attending: Hematology

## 2019-01-20 DIAGNOSIS — R918 Other nonspecific abnormal finding of lung field: Secondary | ICD-10-CM | POA: Insufficient documentation

## 2019-01-20 DIAGNOSIS — C439 Malignant melanoma of skin, unspecified: Secondary | ICD-10-CM

## 2019-01-20 DIAGNOSIS — C4372 Malignant melanoma of left lower limb, including hip: Secondary | ICD-10-CM | POA: Insufficient documentation

## 2019-01-20 DIAGNOSIS — D696 Thrombocytopenia, unspecified: Secondary | ICD-10-CM | POA: Diagnosis not present

## 2019-01-20 LAB — CBC WITH DIFFERENTIAL/PLATELET
Abs Immature Granulocytes: 0.01 K/uL (ref 0.00–0.07)
Basophils Absolute: 0 K/uL (ref 0.0–0.1)
Basophils Relative: 1 %
Eosinophils Absolute: 0.1 K/uL (ref 0.0–0.5)
Eosinophils Relative: 1 %
HCT: 34.6 % — ABNORMAL LOW (ref 39.0–52.0)
Hemoglobin: 11 g/dL — ABNORMAL LOW (ref 13.0–17.0)
Immature Granulocytes: 0 %
Lymphocytes Relative: 21 %
Lymphs Abs: 1 K/uL (ref 0.7–4.0)
MCH: 31.6 pg (ref 26.0–34.0)
MCHC: 31.8 g/dL (ref 30.0–36.0)
MCV: 99.4 fL (ref 80.0–100.0)
Monocytes Absolute: 0.4 K/uL (ref 0.1–1.0)
Monocytes Relative: 9 %
Neutro Abs: 3.4 K/uL (ref 1.7–7.7)
Neutrophils Relative %: 68 %
Platelets: 115 K/uL — ABNORMAL LOW (ref 150–400)
RBC: 3.48 MIL/uL — ABNORMAL LOW (ref 4.22–5.81)
RDW: 14.2 % (ref 11.5–15.5)
WBC: 4.9 K/uL (ref 4.0–10.5)
nRBC: 0 % (ref 0.0–0.2)

## 2019-01-20 LAB — COMPREHENSIVE METABOLIC PANEL WITH GFR
ALT: 22 U/L (ref 0–44)
AST: 17 U/L (ref 15–41)
Albumin: 3.8 g/dL (ref 3.5–5.0)
Alkaline Phosphatase: 76 U/L (ref 38–126)
Anion gap: 5 (ref 5–15)
BUN: 25 mg/dL — ABNORMAL HIGH (ref 8–23)
CO2: 28 mmol/L (ref 22–32)
Calcium: 8.2 mg/dL — ABNORMAL LOW (ref 8.9–10.3)
Chloride: 106 mmol/L (ref 98–111)
Creatinine, Ser: 1.76 mg/dL — ABNORMAL HIGH (ref 0.61–1.24)
GFR calc Af Amer: 42 mL/min — ABNORMAL LOW
GFR calc non Af Amer: 36 mL/min — ABNORMAL LOW
Glucose, Bld: 109 mg/dL — ABNORMAL HIGH (ref 70–99)
Potassium: 4.5 mmol/L (ref 3.5–5.1)
Sodium: 139 mmol/L (ref 135–145)
Total Bilirubin: 0.4 mg/dL (ref 0.3–1.2)
Total Protein: 6.8 g/dL (ref 6.5–8.1)

## 2019-01-20 LAB — IRON AND TIBC
Iron: 109 ug/dL (ref 45–182)
Saturation Ratios: 43 % — ABNORMAL HIGH (ref 17.9–39.5)
TIBC: 252 ug/dL (ref 250–450)
UIBC: 143 ug/dL

## 2019-01-20 LAB — FERRITIN: Ferritin: 236 ng/mL (ref 24–336)

## 2019-01-27 ENCOUNTER — Encounter (HOSPITAL_COMMUNITY): Payer: Self-pay | Admitting: Hematology

## 2019-01-27 ENCOUNTER — Other Ambulatory Visit: Payer: Self-pay

## 2019-01-27 ENCOUNTER — Inpatient Hospital Stay (HOSPITAL_BASED_OUTPATIENT_CLINIC_OR_DEPARTMENT_OTHER): Payer: Medicare Other | Admitting: Hematology

## 2019-01-27 DIAGNOSIS — C439 Malignant melanoma of skin, unspecified: Secondary | ICD-10-CM | POA: Diagnosis not present

## 2019-01-27 DIAGNOSIS — D696 Thrombocytopenia, unspecified: Secondary | ICD-10-CM | POA: Diagnosis not present

## 2019-01-27 DIAGNOSIS — R918 Other nonspecific abnormal finding of lung field: Secondary | ICD-10-CM | POA: Diagnosis not present

## 2019-01-27 NOTE — Progress Notes (Signed)
Virtual Visit via Telephone Note  I connected with Chad Brown on 01/27/19 at  2:35 PM EDT by telephone and verified that I am speaking with the correct person using two identifiers.   I discussed the limitations, risks, security and privacy concerns of performing an evaluation and management service by telephone and the availability of in person appointments. I also discussed with the patient that there may be a patient responsible charge related to this service. The patient expressed understanding and agreed to proceed.   History of Present Illness: 1.  Melanoma of the left thigh 2.  Lung nodules 3.  Mild thrombocytopenia    Observations/Objective: Denies any fevers, night sweats or weight loss.  Denies any hospitalizations or infections.  Denies any bleeding per rectum or melena.  Has easy bruising but denies any bleeding.  Assessment and Plan: #1 lung nodules: I reviewed the results of the CT scan of the chest dated 01/20/2019.  There is a new 9 mm sub-solid lingular pulmonary nodule, potentially inflammatory.  I have recommended a CT follow-up in 6 months.  2.  Mild thrombocytopenia: Platelet count is 115.  He had thrombocytopenia since 2014.  I do not believe this is causing any problems at this time.  3.  Melanoma: He denies any fevers, weight loss.  His blood work was grossly within normal limits.  Follow Up Instructions: I have recommended a follow-up in 6 months with repeat CT scan of the chest.   I discussed the assessment and treatment plan with the patient. The patient was provided an opportunity to ask questions and all were answered. The patient agreed with the plan and demonstrated an understanding of the instructions.   The patient was advised to call back or seek an in-person evaluation if the symptoms worsen or if the condition fails to improve as anticipated.  I provided 12 minutes of non-face-to-face time during this encounter.   Derek Jack, MD

## 2019-02-04 ENCOUNTER — Other Ambulatory Visit (HOSPITAL_COMMUNITY): Payer: Self-pay | Admitting: Cardiovascular Disease

## 2019-02-09 ENCOUNTER — Other Ambulatory Visit: Payer: Self-pay

## 2019-02-09 ENCOUNTER — Ambulatory Visit (INDEPENDENT_AMBULATORY_CARE_PROVIDER_SITE_OTHER): Payer: Medicare Other

## 2019-02-09 DIAGNOSIS — I5022 Chronic systolic (congestive) heart failure: Secondary | ICD-10-CM

## 2019-02-09 DIAGNOSIS — Z95810 Presence of automatic (implantable) cardiac defibrillator: Secondary | ICD-10-CM

## 2019-02-10 NOTE — Progress Notes (Signed)
EPIC Encounter for ICM Monitoring  Patient Name: Chad Brown is a 78 y.o. male Date: 02/10/2019 Primary Care Physican: Dione Housekeeper, MD Primary Cardiologist:Nishan/McLean Electrophysiologist: Allred Bi-V Pacing: 98.2% 01/05/2019 Weight:218.8 lbs 02/10/2019 Weight: 218.4 lbs   Heart failure questions reviewed. Pt asymptomatic.  Walking up hills his legs get weak but hopes to get back to the gym.   Thoracic impedancenormal.  Prescribed:Furosemide 40 mg1tablet (40 mg total)daily.Potassium 10 mEq take 1 tabletevery other day.  Labs: 10/22/2018 Creatinine 1.53, BUN 16, Potassium 4.4, Sodium 139, GFR 43-50 07/22/2018 Creatinine1.58, BUN13, Potassium4.5, [id number], [id number] 07/10/2018 Creatinine1.66, BUN19, Potassium4.7, Sodium138, [id number]  05/01/2018 Creatinine1.47, BUN17, Potassium4.3, [id number], [id number] 03/11/2018 Creatinine 1.11, BUN 13, Potassium 5.0, Sodium 142, GFR >60, BNP 625 01/24/2018 Creatinine 1.24, BUN 19, Potassium 4.9, Sodium 138, GFR 54->60  Recommendation: No changes. Encouraged to call for fluid symptoms.  Follow-up plan: ICM clinic phone appointment on6/15/2020.   Copy of ICM check sent to Dr.Allred.   3 month ICM trend: 02/09/2019    1 Year ICM trend:       Rosalene Billings, RN 02/10/2019 10:29 AM

## 2019-02-11 ENCOUNTER — Telehealth: Payer: Medicare Other | Admitting: Cardiovascular Disease

## 2019-03-04 ENCOUNTER — Other Ambulatory Visit: Payer: Self-pay | Admitting: Cardiology

## 2019-03-05 ENCOUNTER — Ambulatory Visit (INDEPENDENT_AMBULATORY_CARE_PROVIDER_SITE_OTHER): Payer: Medicare Other | Admitting: *Deleted

## 2019-03-05 DIAGNOSIS — I428 Other cardiomyopathies: Secondary | ICD-10-CM

## 2019-03-05 DIAGNOSIS — I5022 Chronic systolic (congestive) heart failure: Secondary | ICD-10-CM

## 2019-03-05 LAB — CUP PACEART REMOTE DEVICE CHECK
Battery Remaining Longevity: 44 mo
Battery Voltage: 2.96 V
Brady Statistic AP VP Percent: 98.39 %
Brady Statistic AP VS Percent: 1.53 %
Brady Statistic AS VP Percent: 0.07 %
Brady Statistic AS VS Percent: 0.02 %
Brady Statistic RA Percent Paced: 99.9 %
Brady Statistic RV Percent Paced: 31.04 %
Date Time Interrogation Session: 20200604083824
HighPow Impedance: 72 Ohm
Implantable Lead Implant Date: 20160607
Implantable Lead Implant Date: 20160607
Implantable Lead Implant Date: 20160607
Implantable Lead Location: 753858
Implantable Lead Location: 753859
Implantable Lead Location: 753860
Implantable Lead Model: 4598
Implantable Lead Model: 5076
Implantable Pulse Generator Implant Date: 20160607
Lead Channel Impedance Value: 1159 Ohm
Lead Channel Impedance Value: 1159 Ohm
Lead Channel Impedance Value: 323 Ohm
Lead Channel Impedance Value: 342 Ohm
Lead Channel Impedance Value: 437 Ohm
Lead Channel Impedance Value: 513 Ohm
Lead Channel Impedance Value: 646 Ohm
Lead Channel Impedance Value: 665 Ohm
Lead Channel Impedance Value: 665 Ohm
Lead Channel Impedance Value: 855 Ohm
Lead Channel Impedance Value: 893 Ohm
Lead Channel Impedance Value: 912 Ohm
Lead Channel Impedance Value: 912 Ohm
Lead Channel Pacing Threshold Amplitude: 0.625 V
Lead Channel Pacing Threshold Amplitude: 0.75 V
Lead Channel Pacing Threshold Amplitude: 1 V
Lead Channel Pacing Threshold Pulse Width: 0.4 ms
Lead Channel Pacing Threshold Pulse Width: 0.4 ms
Lead Channel Pacing Threshold Pulse Width: 0.4 ms
Lead Channel Sensing Intrinsic Amplitude: 1.875 mV
Lead Channel Sensing Intrinsic Amplitude: 13.125 mV
Lead Channel Setting Pacing Amplitude: 1.5 V
Lead Channel Setting Pacing Amplitude: 2 V
Lead Channel Setting Pacing Amplitude: 2.5 V
Lead Channel Setting Pacing Pulse Width: 0.4 ms
Lead Channel Setting Pacing Pulse Width: 0.4 ms
Lead Channel Setting Sensing Sensitivity: 0.3 mV

## 2019-03-12 NOTE — Progress Notes (Signed)
Remote ICD transmission.   

## 2019-03-16 ENCOUNTER — Ambulatory Visit (INDEPENDENT_AMBULATORY_CARE_PROVIDER_SITE_OTHER): Payer: Medicare Other

## 2019-03-16 ENCOUNTER — Other Ambulatory Visit: Payer: Self-pay | Admitting: Cardiology

## 2019-03-16 DIAGNOSIS — I5022 Chronic systolic (congestive) heart failure: Secondary | ICD-10-CM

## 2019-03-16 DIAGNOSIS — Z95810 Presence of automatic (implantable) cardiac defibrillator: Secondary | ICD-10-CM

## 2019-03-17 NOTE — Progress Notes (Signed)
EPIC Encounter for ICM Monitoring  Patient Name: Chad Brown is a 78 y.o. male Date: 03/17/2019 Primary Care Physican: Dione Housekeeper, MD Primary Cardiologist:Nishan/McLean Electrophysiologist: Allred Bi-V Pacing: 98.2% 02/10/2019 Weight: 218.4 lbs 03/18/2019 Weight: 218 lbs   Heart failure questions reviewed. Pt asymptomatic.  Actively uses mychart and can send messages if needed  Optivol thoracic impedancenormal.  Prescribed:Furosemide 40 mg1tablet (40 mg total)daily.Potassium 10 mEq take 1 tabletevery other day.  Labs: 10/22/2018 Creatinine 1.53, BUN 16, Potassium 4.4, Sodium 139, GFR 43-50 07/22/2018 Creatinine1.58, BUN13, Potassium4.5, [id number], [id number] 07/10/2018 Creatinine1.66, BUN19, Potassium4.7, Sodium138, [id number]  05/01/2018 Creatinine1.47, BUN17, Potassium4.3, [id number], [id number] 03/11/2018 Creatinine 1.11, BUN 13, Potassium 5.0, Sodium 142, GFR >60, BNP 625 01/24/2018 Creatinine 1.24, BUN 19, Potassium 4.9, Sodium 138, GFR 54->60  Recommendation: No changes. Encouraged to call for fluid symptoms.  Follow-up plan: ICM clinic phone appointment on7/20/2020.   Copy of ICM check sent to Dr.Allred.  3 month ICM trend: 03/16/2019    1 Year ICM trend:       Rosalene Billings, RN 03/17/2019 10:09 AM

## 2019-03-28 ENCOUNTER — Other Ambulatory Visit (HOSPITAL_COMMUNITY): Payer: Self-pay | Admitting: Cardiovascular Disease

## 2019-04-20 ENCOUNTER — Other Ambulatory Visit (HOSPITAL_COMMUNITY): Payer: Self-pay | Admitting: Cardiology

## 2019-04-20 ENCOUNTER — Ambulatory Visit (INDEPENDENT_AMBULATORY_CARE_PROVIDER_SITE_OTHER): Payer: Medicare Other

## 2019-04-20 DIAGNOSIS — I5022 Chronic systolic (congestive) heart failure: Secondary | ICD-10-CM

## 2019-04-20 DIAGNOSIS — Z95810 Presence of automatic (implantable) cardiac defibrillator: Secondary | ICD-10-CM

## 2019-04-21 NOTE — Progress Notes (Signed)
EPIC Encounter for ICM Monitoring  Patient Name: Chad Brown is a 78 y.o. male Date: 04/21/2019 Primary Care Physican: Dione Housekeeper, MD Primary Cardiologist:Nishan/McLean Electrophysiologist: Allred Bi-V Pacing: 98.2% 03/18/2019 Weight: 218 lbs 04/22/2019 Weight: 220 lbs  Heart failure questions reviewed. Pt asymptomatic and feeling fine.Actively uses mychart and can send messages if needed  Optivol thoracic impedancenormal.  Prescribed:Furosemide40 mg1tablet (40 mg total)daily.Potassium 10 mEq take 1 tabletevery other day.  Labs: 10/22/2018 Creatinine 1.53, BUN 16, Potassium 4.4, Sodium 139, GFR 43-50  Recommendation: No changes. Encouraged to call for fluid symptoms.  Follow-up plan: ICM clinic phone appointment on8/24/2020.   Copy of ICM check sent to Dr.Allred.   3 month ICM trend: 04/20/2019    1 Year ICM trend:       Rosalene Billings, RN 04/21/2019 10:50 AM

## 2019-04-27 ENCOUNTER — Other Ambulatory Visit (HOSPITAL_COMMUNITY): Payer: Self-pay | Admitting: Cardiology

## 2019-05-20 ENCOUNTER — Other Ambulatory Visit: Payer: Self-pay

## 2019-05-20 ENCOUNTER — Encounter: Payer: Self-pay | Admitting: Cardiovascular Disease

## 2019-05-20 ENCOUNTER — Ambulatory Visit (INDEPENDENT_AMBULATORY_CARE_PROVIDER_SITE_OTHER): Payer: Medicare Other | Admitting: Cardiovascular Disease

## 2019-05-20 DIAGNOSIS — I739 Peripheral vascular disease, unspecified: Secondary | ICD-10-CM | POA: Diagnosis not present

## 2019-05-20 NOTE — Patient Instructions (Signed)
Medication Instructions:  Your physician recommends that you continue on your current medications as directed. Please refer to the Current Medication list given to you today.  If you need a refill on your cardiac medications before your next appointment, please call your pharmacy.   Lab work: NONE If you have labs (blood work) drawn today and your tests are completely normal, you will receive your results only by: . MyChart Message (if you have MyChart) OR . A paper copy in the mail If you have any lab test that is abnormal or we need to change your treatment, we will call you to review the results.  Testing/Procedures: NONE  Follow-Up: At [facility], you and your health needs are our priority.  As part of our continuing mission to provide you with exceptional heart care, we have created designated Provider Care Teams.  These Care Teams include your primary Cardiologist (physician) and Advanced Practice Providers (APPs -  Physician Assistants and Nurse Practitioners) who all work together to provide you with the care you need, when you need it. . You MAY SCHEDULE a follow up appointment AS NEEDED. You may see Dr. Berry or one of the following Advanced Practice Providers on your designated Care Team:   . Luke Kilroy, PA-C . Krista Kroeger, PA-C . Callie Goodrich, PA-C   

## 2019-05-20 NOTE — Assessment & Plan Note (Signed)
[name] was referred to me by Dr. Johnsie Cancel for evaluation of PAD.  He has a long history of nonischemic cardiomyopathy status post Bi V ICD implantation (CRT) with improvement in his ejection fraction.  He also has A. fib on oral anticoagulation.  He had lower extremity arterial Doppler studies performed in our office 11/27/2018 revealing a right ABI of 0.94 and a left of 1.03.  He had a high-frequency signal in his mid right SFA with a mildly elevated signal in his left.  He really denies claudication.  He has had a right total hip replacement back in 2008 and complains of bilateral knee pain and bone spurs in his feet.  I cannot elicit a good story for claudication.  Given this, his age and other comorbidities with the lack of convincing symptoms of claudication I am inclined not to treat him in an invasively at this time.  I have told him that should he develop true claudication symptoms would be happy to revisit this.

## 2019-05-20 NOTE — Progress Notes (Signed)
05/20/2019 Chad Brown   03-Mar-1941  [id number]  Primary Physician Tisovec, Fransico Him, MD Primary Cardiologist: Dr. [name], Dr. Loralie Champagne Electrophysiologist: Dr. Thompson Grayer Peripheral vascular cardiologist: Dr. Quay Burow  HPI:  Chad Brown is a 78 y.o. 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  He was referred by Dr. Johnsie Cancel for peripheral vascular valuation because of an abnormal Doppler study.  He has a history of remote tobacco abuse, treated hypertension and hyperlipidemia.  His mother did have bypass surgery.  He has had a "myocardial infarction in the past but never has had a stroke.  He carries a diagnosis of nonischemic cardiomyopathy status post biventricular ICD implantation (CRT) with improvement in his EF up to 45 to 50%.  He is on appropriate medications.  He has chronic A. fib on oral anticoagulation.  He had Doppler studies performed in our office 11/27/2018 revealing a right ABI of 0.94 and a left of 1.03 with a high-frequency signal in his mid right SFA.  He really denies claudication symptoms.  He does have bilateral knee pain and bone spurs in his feet.  Complains of "restless leg syndrome type symptoms.  [facility] [place] [facility] [employer] [facility] [place] 60 minutes prior to COVID-19 but is less active now.   Current Meds  Medication Sig  . amiodarone (PACERONE) 200 MG tablet TAKE 1 TABLET DAILY  . carvedilol (COREG) 12.5 MG tablet Take 1 tablet (12.5 mg total) by mouth 2 (two) times daily with a meal.  . Coenzyme Q10 (COQ10 GUMMIES ADULT PO) Take 1 tablet at bedtime by mouth.  . dorzolamide-timolol (COSOPT) 22.3-6.8 MG/ML ophthalmic solution Place 1 drop into the right eye 2 (two) times daily.  Chad Brown Kitchen escitalopram (LEXAPRO) 20 MG tablet TAKE 1 TABLET ONCE A DAY  . ezetimibe (ZETIA) 10 MG tablet Take 1 tablet (10 mg total) by mouth daily.  .  ferrous sulfate 325 (65 FE) MG tablet Take 325 mg every other day by mouth.  . finasteride (PROSCAR) 5 MG tablet Take 5 mg daily by mouth.   . furosemide (LASIX) 40 MG tablet Take 1 tablet (40 mg total) by mouth daily.  Chad Brown Kitchen gabapentin (NEURONTIN) 100 MG capsule Take 200 mg 2 (two) times daily by mouth. TAKE 2 TAB BY MOUTH IN THE AM AND 2 TABS BY MOUTH IN THE PM  . latanoprost (XALATAN) 0.005 % ophthalmic solution Place 1 drop at bedtime into both eyes.  Chad Brown Kitchen losartan (COZAAR) 50 MG tablet TAKE 1 TABLET IN THE MORNING AND 1/2 TABLET IN THE EVENING  . Netarsudil Dimesylate (RHOPRESSA) 0.02 % SOLN Place 1 drop daily into both eyes.  . niacin (NIASPAN) 500 MG CR tablet Take 1,000 mg at bedtime by mouth.   . nitroGLYCERIN (NITROSTAT) 0.4 MG SL tablet Place 0.4 mg under the tongue as needed for chest pain.  Chad Brown Glycol-Propyl Glycol (SYSTANE ULTRA OP) Place 1 drop into the left eye 2 (two) times daily.   . potassium chloride (K-DUR) 10 MEQ tablet Take 1 tablet (10 mEq total) by mouth every other day. Please make annual appt for future refills. Thank you.  . spironolactone (ALDACTONE) 25 MG tablet TAKE (1/2) TABLET DAILY.  . tamsulosin (FLOMAX) 0.4 MG CAPS Take 0.4 mg by mouth at bedtime.   Chad Brown 20 MG TABS tablet TAKE 1 TABLET ONCE DAILY WITH SUPPER  Allergies  Allergen Reactions  . Pravastatin Sodium Other (See Comments)    Joint and muscle pain Joint and muscle pain  . Azithromycin Diarrhea  . Other Diarrhea    Social History   Socioeconomic History  . Marital status: Married    Spouse name: Not on file  . Number of children: 3  . Years of education: Not on file  . Highest education level: Not on file  Occupational History  . Occupation: retired  Scientific laboratory technician  . Financial resource strain: Not on file  . Food insecurity    Worry: Not on file    Inability: Not on file  . Transportation needs    Medical: Not on file    Non-medical: Not on file  Tobacco Use  . Smoking  status: Former Smoker    Packs/day: 3.00    Years: 48.00    Pack years: 144.00    Types: Cigarettes    Quit date: 09/28/2000    Years since quitting: 18.6  . Smokeless tobacco: Never Used  Substance and Sexual Activity  . Alcohol use: No  . Drug use: No  . Sexual activity: Yes  Lifestyle  . Physical activity    Days per week: Not on file    Minutes per session: Not on file  . Stress: Not on file  Relationships  . Social Herbalist on phone: Not on file    Gets together: Not on file    Attends religious service: Not on file    Active member of club or organization: Not on file    Attends meetings of clubs or organizations: Not on file    Relationship status: Not on file  . Intimate partner violence    Fear of current or ex partner: Not on file    Emotionally abused: Not on file    Physically abused: Not on file    Forced sexual activity: Not on file  Other Topics Concern  . Not on file  Social History Narrative   Pt lives in [place] with spouse.  3 children are grown and healthy.   Retired.  Ran a country store for 30 years, previously worked in the [employer] for 16 years     Review of Systems: General: negative for chills, fever, night sweats or weight changes.  Cardiovascular: negative for chest pain, dyspnea on exertion, edema, orthopnea, palpitations, paroxysmal nocturnal dyspnea or shortness of breath Dermatological: negative for rash Respiratory: negative for cough or wheezing Urologic: negative for hematuria Abdominal: negative for nausea, vomiting, diarrhea, bright red blood per rectum, melena, or hematemesis Neurologic: negative for visual changes, syncope, or dizziness All other systems reviewed and are otherwise negative except as noted above.    Blood pressure 136/84, pulse 81, temperature (!) 97 F (36.1 C), height 5' 6.5" (1.689 m), weight 217 lb (98.4 kg).  General appearance: alert and no distress Neck: no adenopathy, no  carotid bruit, no JVD, supple, symmetrical, trachea midline and thyroid not enlarged, symmetric, no tenderness/mass/nodules Lungs: clear to auscultation bilaterally Heart: regular rate and rhythm, S1, S2 normal, no murmur, click, rub or gallop Extremities: extremities normal, atraumatic, no cyanosis or edema Pulses: Diminished pedal pulses bilaterally Skin: Skin color, texture, turgor normal. No rashes or lesions Neurologic: Alert and oriented X 3, normal strength and tone. Normal symmetric reflexes. Normal coordination and gait  EKG not performed today  ASSESSMENT AND PLAN:   Peripheral arterial disease [facility]) Mr. Lyndle Herrlich was referred to me by Dr.  Nishan for evaluation of PAD.  He has a long history of nonischemic cardiomyopathy status post Bi V ICD implantation (CRT) with improvement in his ejection fraction.  He also has A. fib on oral anticoagulation.  He had lower extremity arterial Doppler studies performed in our office 11/27/2018 revealing a right ABI of 0.94 and a left of 1.03.  He had a high-frequency signal in his mid right SFA with a mildly elevated signal in his left.  He really denies claudication.  He has had a right total hip replacement back in 2008 and complains of bilateral knee pain and bone spurs in his feet.  I cannot elicit a good story for claudication.  Given this, his age and other comorbidities with the lack of convincing symptoms of claudication I am inclined not to treat him in an invasively at this time.  I have told him that should he develop true claudication symptoms would be happy to revisit this.      Lorretta Harp MD FACP,FACC,FAHA, [facility] 05/20/2019 9:18 AM

## 2019-05-25 ENCOUNTER — Ambulatory Visit (INDEPENDENT_AMBULATORY_CARE_PROVIDER_SITE_OTHER): Payer: Medicare Other

## 2019-05-25 DIAGNOSIS — I5022 Chronic systolic (congestive) heart failure: Secondary | ICD-10-CM

## 2019-05-25 DIAGNOSIS — Z95810 Presence of automatic (implantable) cardiac defibrillator: Secondary | ICD-10-CM

## 2019-05-26 NOTE — Progress Notes (Signed)
EPIC Encounter for ICM Monitoring  Patient Name: Chad Brown is a 78 y.o. male Date: 05/26/2019 Primary Care Physican: Haywood Pao, MD Primary Cardiologist:Nishan/McLean Electrophysiologist: Allred Bi-V Pacing: 98.2% 04/22/2019 Weight: 220 lbs  Transmission reviewed and results sent via mychart.  Optivol thoracic impedancenormal.  Prescribed:Furosemide40 mg1tablet (40 mg total)daily.Potassium 10 mEq take 1 tabletevery other day.  Labs: 01/20/2019 Creatinine 1.76, BUN 25, Potassium 4.5, Sodium 139, GFR 36-42 10/22/2018 Creatinine 1.53, BUN 16, Potassium 4.4, Sodium 139, GFR 43-50  Recommendation: Advised to call if experiencing any fluid symptoms in my chart message.   Follow-up plan: ICM clinic phone appointment on10/03/2019.   Copy of ICM check sent to Dr.Allred.   3 month ICM trend: 05/25/2019    1 Year ICM trend:       Rosalene Billings, RN 05/26/2019 10:34 AM

## 2019-06-04 ENCOUNTER — Ambulatory Visit (INDEPENDENT_AMBULATORY_CARE_PROVIDER_SITE_OTHER): Payer: Medicare Other | Admitting: *Deleted

## 2019-06-04 DIAGNOSIS — I428 Other cardiomyopathies: Secondary | ICD-10-CM

## 2019-06-04 LAB — CUP PACEART REMOTE DEVICE CHECK
Battery Remaining Longevity: 41 mo
Battery Voltage: 2.95 V
Brady Statistic AP VP Percent: 98.41 %
Brady Statistic AP VS Percent: 1.55 %
Brady Statistic AS VP Percent: 0.02 %
Brady Statistic AS VS Percent: 0.02 %
Brady Statistic RA Percent Paced: 99.94 %
Brady Statistic RV Percent Paced: 26.63 %
Date Time Interrogation Session: 20200903062704
HighPow Impedance: 71 Ohm
Implantable Lead Implant Date: 20160607
Implantable Lead Implant Date: 20160607
Implantable Lead Implant Date: 20160607
Implantable Lead Location: 753858
Implantable Lead Location: 753859
Implantable Lead Location: 753860
Implantable Lead Model: 4598
Implantable Lead Model: 5076
Implantable Pulse Generator Implant Date: 20160607
Lead Channel Impedance Value: 1045 Ohm
Lead Channel Impedance Value: 1083 Ohm
Lead Channel Impedance Value: 342 Ohm
Lead Channel Impedance Value: 342 Ohm
Lead Channel Impedance Value: 380 Ohm
Lead Channel Impedance Value: 494 Ohm
Lead Channel Impedance Value: 589 Ohm
Lead Channel Impedance Value: 627 Ohm
Lead Channel Impedance Value: 646 Ohm
Lead Channel Impedance Value: 817 Ohm
Lead Channel Impedance Value: 817 Ohm
Lead Channel Impedance Value: 836 Ohm
Lead Channel Impedance Value: 893 Ohm
Lead Channel Pacing Threshold Amplitude: 0.625 V
Lead Channel Pacing Threshold Amplitude: 0.875 V
Lead Channel Pacing Threshold Amplitude: 1 V
Lead Channel Pacing Threshold Pulse Width: 0.4 ms
Lead Channel Pacing Threshold Pulse Width: 0.4 ms
Lead Channel Pacing Threshold Pulse Width: 0.4 ms
Lead Channel Sensing Intrinsic Amplitude: 1.875 mV
Lead Channel Sensing Intrinsic Amplitude: 1.875 mV
Lead Channel Sensing Intrinsic Amplitude: 12.75 mV
Lead Channel Sensing Intrinsic Amplitude: 12.75 mV
Lead Channel Setting Pacing Amplitude: 1.5 V
Lead Channel Setting Pacing Amplitude: 2 V
Lead Channel Setting Pacing Amplitude: 2.5 V
Lead Channel Setting Pacing Pulse Width: 0.4 ms
Lead Channel Setting Pacing Pulse Width: 0.4 ms
Lead Channel Setting Sensing Sensitivity: 0.3 mV

## 2019-06-17 ENCOUNTER — Encounter: Payer: Self-pay | Admitting: Cardiology

## 2019-06-17 NOTE — Progress Notes (Signed)
Remote ICD transmission.   

## 2019-06-19 ENCOUNTER — Other Ambulatory Visit: Payer: Self-pay | Admitting: Cardiology

## 2019-06-22 ENCOUNTER — Other Ambulatory Visit (HOSPITAL_COMMUNITY): Payer: Self-pay

## 2019-06-22 MED ORDER — FINASTERIDE 5 MG PO TABS: 5.0000 mg | ORAL_TABLET | Freq: Every day | ORAL | 2 refills | Status: AC

## 2019-06-23 ENCOUNTER — Other Ambulatory Visit (HOSPITAL_COMMUNITY): Payer: Self-pay

## 2019-06-23 MED ORDER — ESCITALOPRAM OXALATE 20 MG PO TABS: 20.0000 mg | ORAL_TABLET | Freq: Every day | ORAL | 0 refills | Status: AC

## 2019-06-27 ENCOUNTER — Other Ambulatory Visit (HOSPITAL_COMMUNITY): Payer: Self-pay | Admitting: Cardiovascular Disease

## 2019-07-07 ENCOUNTER — Ambulatory Visit (INDEPENDENT_AMBULATORY_CARE_PROVIDER_SITE_OTHER): Payer: Medicare Other

## 2019-07-07 DIAGNOSIS — Z95810 Presence of automatic (implantable) cardiac defibrillator: Secondary | ICD-10-CM

## 2019-07-07 DIAGNOSIS — I5022 Chronic systolic (congestive) heart failure: Secondary | ICD-10-CM | POA: Diagnosis not present

## 2019-07-10 NOTE — Progress Notes (Signed)
EPIC Encounter for ICM Monitoring  Patient Name: Chad Brown is a 78 y.o. male Date: 07/10/2019 Primary Care Physican: Haywood Pao, MD Primary Cardiologist:Nishan/McLean Electrophysiologist: Allred Bi-V Pacing: 98.2% 05/20/2019 Weight: 217 lbs  Attempted call to patient and spoke with wife per DPR.  She reports he is doing well and does not have any fluid symptoms.  Optivol thoracic impedancenormal.  Prescribed:Furosemide40 mg1tablet (40 mg total)daily.Potassium 10 mEq take 1 tabletevery other day.  Labs: 01/20/2019 Creatinine 1.76, BUN 25, Potassium 4.5, Sodium 139, GFR 36-42 10/22/2018 Creatinine 1.53, BUN 16, Potassium 4.4, Sodium 139, GFR 43-50  Recommendations: No changes and encouraged to call if experiencing any fluid symptoms.  Follow-up plan: ICM clinic phone appointment on 08/10/2019.   91 day device clinic remote transmission 09/03/2019.   Copy of ICM check sent to Dr. Rayann Heman.   3 month ICM trend: 07/07/2019    1 Year ICM trend:       Rosalene Billings, RN 07/10/2019 2:34 PM

## 2019-07-27 ENCOUNTER — Other Ambulatory Visit: Payer: Self-pay

## 2019-07-27 ENCOUNTER — Inpatient Hospital Stay (HOSPITAL_COMMUNITY): Payer: Medicare Other | Attending: Hematology

## 2019-07-27 ENCOUNTER — Ambulatory Visit (HOSPITAL_COMMUNITY)
Admission: RE | Admit: 2019-07-27 | Discharge: 2019-07-27 | Disposition: A | Discharge status: Home / Self Care | Payer: Medicare Other | Source: Ambulatory Visit | Attending: Hematology | Admitting: Hematology

## 2019-07-27 DIAGNOSIS — Z87891 Personal history of nicotine dependence: Secondary | ICD-10-CM | POA: Diagnosis not present

## 2019-07-27 DIAGNOSIS — N189 Chronic kidney disease, unspecified: Secondary | ICD-10-CM | POA: Diagnosis not present

## 2019-07-27 DIAGNOSIS — Z85820 Personal history of malignant melanoma of skin: Secondary | ICD-10-CM | POA: Insufficient documentation

## 2019-07-27 DIAGNOSIS — D696 Thrombocytopenia, unspecified: Secondary | ICD-10-CM | POA: Diagnosis present

## 2019-07-27 DIAGNOSIS — Z79899 Other long term (current) drug therapy: Secondary | ICD-10-CM | POA: Diagnosis not present

## 2019-07-27 DIAGNOSIS — R918 Other nonspecific abnormal finding of lung field: Secondary | ICD-10-CM | POA: Insufficient documentation

## 2019-07-27 DIAGNOSIS — D649 Anemia, unspecified: Secondary | ICD-10-CM | POA: Insufficient documentation

## 2019-07-27 DIAGNOSIS — C439 Malignant melanoma of skin, unspecified: Secondary | ICD-10-CM | POA: Insufficient documentation

## 2019-07-27 LAB — COMPREHENSIVE METABOLIC PANEL
ALT: 21 U/L (ref 0–44)
AST: 17 U/L (ref 15–41)
Albumin: 3.7 g/dL (ref 3.5–5.0)
Alkaline Phosphatase: 74 U/L (ref 38–126)
Anion gap: 9 (ref 5–15)
BUN: 21 mg/dL (ref 8–23)
CO2: 27 mmol/L (ref 22–32)
Calcium: 8.6 mg/dL — ABNORMAL LOW (ref 8.9–10.3)
Chloride: 105 mmol/L (ref 98–111)
Creatinine, Ser: 1.76 mg/dL — ABNORMAL HIGH (ref 0.61–1.24)
GFR calc Af Amer: 42 mL/min — ABNORMAL LOW (ref >60)
GFR calc non Af Amer: 36 mL/min — ABNORMAL LOW (ref >60)
Glucose, Bld: 114 mg/dL — ABNORMAL HIGH (ref 70–99)
Potassium: 4.7 mmol/L (ref 3.5–5.1)
Sodium: 141 mmol/L (ref 135–145)
Total Bilirubin: 0.5 mg/dL (ref 0.3–1.2)
Total Protein: 6.8 g/dL (ref 6.5–8.1)

## 2019-07-27 LAB — CBC WITH DIFFERENTIAL/PLATELET
Abs Immature Granulocytes: 0.02 10*3/uL (ref 0.00–0.07)
Basophils Absolute: 0 10*3/uL (ref 0.0–0.1)
Basophils Relative: 1 %
Eosinophils Absolute: 0.1 10*3/uL (ref 0.0–0.5)
Eosinophils Relative: 1 %
HCT: 35.9 % — ABNORMAL LOW (ref 39.0–52.0)
Hemoglobin: 11.3 g/dL — ABNORMAL LOW (ref 13.0–17.0)
Immature Granulocytes: 0 %
Lymphocytes Relative: 20 %
Lymphs Abs: 1.1 10*3/uL (ref 0.7–4.0)
MCH: 31.9 pg (ref 26.0–34.0)
MCHC: 31.5 g/dL (ref 30.0–36.0)
MCV: 101.4 fL — ABNORMAL HIGH (ref 80.0–100.0)
Monocytes Absolute: 0.5 10*3/uL (ref 0.1–1.0)
Monocytes Relative: 8 %
Neutro Abs: 3.8 10*3/uL (ref 1.7–7.7)
Neutrophils Relative %: 70 %
Platelets: 138 10*3/uL — ABNORMAL LOW (ref 150–400)
RBC: 3.54 MIL/uL — ABNORMAL LOW (ref 4.22–5.81)
RDW: 14.5 % (ref 11.5–15.5)
WBC: 5.5 10*3/uL (ref 4.0–10.5)
nRBC: 0 % (ref 0.0–0.2)

## 2019-07-27 LAB — IRON AND TIBC
Iron: 79 ug/dL (ref 45–182)
Saturation Ratios: 30 % (ref 17.9–39.5)
TIBC: 263 ug/dL (ref 250–450)
UIBC: 184 ug/dL

## 2019-07-27 LAB — LACTATE DEHYDROGENASE: LDH: 143 U/L (ref 98–192)

## 2019-07-27 LAB — FERRITIN: Ferritin: 213 ng/mL (ref 24–336)

## 2019-07-29 ENCOUNTER — Other Ambulatory Visit (HOSPITAL_COMMUNITY): Payer: Self-pay | Admitting: Cardiology

## 2019-07-29 ENCOUNTER — Encounter (HOSPITAL_COMMUNITY): Payer: Self-pay | Admitting: Hematology

## 2019-07-29 ENCOUNTER — Inpatient Hospital Stay (HOSPITAL_COMMUNITY): Payer: Medicare Other | Admitting: Hematology

## 2019-07-29 ENCOUNTER — Other Ambulatory Visit: Payer: Self-pay

## 2019-07-29 VITALS — BP 86/55 | HR 78 | Temp 97.9°F | Resp 20 | Wt 223.6 lb

## 2019-07-29 DIAGNOSIS — R918 Other nonspecific abnormal finding of lung field: Secondary | ICD-10-CM | POA: Diagnosis not present

## 2019-07-29 DIAGNOSIS — C439 Malignant melanoma of skin, unspecified: Secondary | ICD-10-CM

## 2019-07-29 NOTE — Patient Instructions (Addendum)
[place] at [facility] Discharge Instructions  You were seen today by Dr. Delton Coombes. He went over your recent test results. He will repeat your scan in 6 months. He will see you back in 6 months for labs and follow up.  Start taking 1/2 tablet of Losartin at night only . Thank you for choosing [place] at [facility] to provide your oncology and hematology care.  To afford each patient quality time with our provider, please arrive at least 15 minutes before your scheduled appointment time.   If you have a lab appointment with the [place] please come in thru the  Main Entrance and check in at the main information desk  You need to re-schedule your appointment should you arrive 10 or more minutes late.  We strive to give you quality time with our providers, and arriving late affects you and other patients whose appointments are after yours.  Also, if you no show three or more times for appointments you may be dismissed from the clinic at the providers discretion.     Again, thank you for choosing [facility].  Our hope is that these requests will decrease the amount of time that you wait before being seen by our physicians.       _____________________________________________________________  Should you have questions after your visit to [facility], please contact our office at [phone] between the hours of 8:00 a.m. and 4:30 p.m.  Voicemails left after 4:00 p.m. will not be returned until the following business day.  For prescription refill requests, have your pharmacy contact our office and allow 72 hours.    Cancer Center Support Programs:   > Cancer Support Group  2nd Tuesday of the month 1pm-2pm, Journey Room

## 2019-07-31 ENCOUNTER — Encounter (HOSPITAL_COMMUNITY): Payer: Self-pay | Admitting: Hematology

## 2019-07-31 NOTE — Assessment & Plan Note (Signed)
1.  Lung nodules: -He was an ex-smoker who smoked more than 2 packs/day for many years and quit in 2001. -CT chest in April 2020 showed new lung nodule.  Denies any cough or hemoptysis. -CT scan on 07/27/2019 showed resolution of the previous new lung nodule.  No major changes were seen. -I will see him back in 6 months with repeat CT scan.  2.  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  Denies any new onset pains. -We are following lung nodules with serial CT scans. -No routine scans necessary for surveillance of melanoma.  3.  Mild thrombocytopenia: -He has mild thrombocytopenia of several years duration.  Latest platelet count is 138.  He does not report any easy bruising or bleeding.  4.  CKD: -Creatinine is stable around 1.76. She does have normocytic anemia with hemoglobin 11.3.  Ferritin is 230 and percent saturation is 30.

## 2019-07-31 NOTE — Progress Notes (Signed)
Patient Care Team: Tisovec, Fransico Him, MD as PCP - General (Internal Medicine) Josue Hector, MD as PCP - Cardiology (Cardiology) Sueanne Margarita, MD as PCP - Sleep Medicine (Cardiology)  DIAGNOSIS:  Encounter Diagnoses  Name Primary?  . Lung nodules Yes  . Melanoma of skin ([place])      CHIEF COMPLIANT: Follow-up for melanoma.  INTERVAL HISTORY: Chad Brown is a 78 y.o. seen for follow-up of lung nodules in the setting of malignant melanoma which was resected.  He also has mild thrombocytopenia.  Denies any bleeding per rectum or melena.  Denies any easy bruising.  Appetite and energy levels are 100%.  Occasional dizziness on standing present.  No recent ER visits or hospitalizations.  No change in bowel habits.  REVIEW OF SYSTEMS:   Constitutional: Denies fevers, chills or abnormal weight loss.  Positive for dizziness. Eyes: Denies blurriness of vision Ears, nose, mouth, throat, and face: Denies mucositis or sore throat Respiratory: Denies cough, dyspnea or wheezes Cardiovascular: Denies palpitation, chest discomfort Gastrointestinal:  Denies nausea, heartburn or change in bowel habits Skin: Denies abnormal skin rashes Lymphatics: Denies new lymphadenopathy or easy bruising Neurological: Positive for numbness in the toes.  No weakness. Behavioral/Psych: Mood is stable, no new changes  Extremities: No lower extremity edema All other systems were reviewed with the patient and are negative.  I have reviewed the past medical history, past surgical history, social history and family history with the patient and they are unchanged from previous note.  ALLERGIES:  is allergic to pravastatin sodium; azithromycin; and other.  MEDICATIONS:  Current Outpatient Medications  Medication Sig Dispense Refill  . amiodarone (PACERONE) 200 MG tablet TAKE 1 TABLET DAILY 90 tablet 3  . carvedilol (COREG) 12.5 MG tablet Take 1 tablet (12.5 mg total) by mouth 2 (two) times daily with a  meal. 180 tablet 0  . Coenzyme Q10 (COQ10 GUMMIES ADULT PO) Take 1 tablet at bedtime by mouth.    . dorzolamide-timolol (COSOPT) 22.3-6.8 MG/ML ophthalmic solution Place 1 drop into the right eye 2 (two) times daily.    [name] escitalopram (LEXAPRO) 20 MG tablet Take 1 tablet (20 mg total) by mouth daily. 90 tablet 0  . ezetimibe (ZETIA) 10 MG tablet Take 1 tablet (10 mg total) by mouth daily. 90 tablet 2  . ferrous sulfate 325 (65 FE) MG tablet Take 325 mg every other day by mouth.    . finasteride (PROSCAR) 5 MG tablet Take 1 tablet (5 mg total) by mouth daily. 30 tablet 2  . furosemide (LASIX) 40 MG tablet Take 1 tablet (40 mg total) by mouth daily. 90 tablet 3  . gabapentin (NEURONTIN) 100 MG capsule Take 200 mg 2 (two) times daily by mouth. TAKE 2 TAB BY MOUTH IN THE AM AND 2 TABS BY MOUTH IN THE PM    . latanoprost (XALATAN) 0.005 % ophthalmic solution Place 1 drop at bedtime into both eyes.    [name] losartan (COZAAR) 50 MG tablet TAKE 1 TABLET IN THE MORNING AND 1/2 TABLET IN THE EVENING 135 tablet 0  . Netarsudil Dimesylate (RHOPRESSA) 0.02 % SOLN Place 1 drop daily into both eyes.    . niacin (NIASPAN) 500 MG CR tablet Take 1,000 mg at bedtime by mouth.     [name] Glycol-Propyl Glycol (SYSTANE ULTRA OP) Place 1 drop into the left eye 2 (two) times daily.     . potassium chloride (K-DUR) 10 MEQ tablet Take 1 tablet (10 mEq total) by  mouth every other day. Please make annual appt for future refills. Thank you. 45 tablet 0  . spironolactone (ALDACTONE) 25 MG tablet TAKE (1/2) TABLET DAILY. 90 tablet 3  . tamsulosin (FLOMAX) 0.4 MG CAPS Take 0.4 mg by mouth at bedtime.     Alveda Reasons 20 MG TABS tablet TAKE 1 TABLET ONCE DAILY WITH SUPPER 90 tablet 0  . nitroGLYCERIN (NITROSTAT) 0.4 MG SL tablet Place 0.4 mg under the tongue as needed for chest pain.    . potassium chloride (KLOR-CON) 10 MEQ tablet Take 1 tablet (10 mEq total) by mouth every other day. 45 tablet 0   No current  facility-administered medications for this visit.     PHYSICAL EXAMINATION: ECOG PERFORMANCE STATUS: 1 - Symptomatic but completely ambulatory  Vitals:   07/29/19 1124  BP: (!) 86/55  Pulse: 78  Resp: 20  Temp: 97.9 F (36.6 C)  SpO2: 100%   Filed Weights   07/29/19 1124  Weight: 223 lb 9.6 oz (101.4 kg)    GENERAL:alert, no distress and comfortable SKIN: skin color, texture, turgor are normal, no rashes or significant lesions EYES: normal, Conjunctiva are pink and non-injected, sclera clear OROPHARYNX:no mucositis, no erythema and lips, buccal mucosa, and tongue normal  NECK: supple, thyroid normal size, non-tender, without nodularity LYMPH:  no palpable lymphadenopathy in the cervical, axillary or inguinal LUNGS: clear to auscultation and percussion with normal breathing effort HEART: regular rate & rhythm and no murmurs and no lower extremity edema ABDOMEN:abdomen soft, non-tender and normal bowel sounds MUSCULOSKELETAL:no cyanosis of digits and no clubbing  EXTREMITIES: No lower extremity edema   LABORATORY DATA:  I have reviewed the data as listed CMP Latest Ref Rng & Units 07/27/2019 01/20/2019 10/22/2018  Glucose 70 - 99 mg/dL 114(H) 109(H) 115(H)  BUN 8 - 23 mg/dL 21 25(H) 16  Creatinine 0.61 - 1.24 mg/dL 1.76(H) 1.76(H) 1.53(H)  Sodium 135 - 145 mmol/L 141 139 139  Potassium 3.5 - 5.1 mmol/L 4.7 4.5 4.4  Chloride 98 - 111 mmol/L 105 106 104  CO2 22 - 32 mmol/L 27 28 28   Calcium 8.9 - 10.3 mg/dL 8.6(L) 8.2(L) 8.6(L)  Total Protein 6.5 - 8.1 g/dL 6.8 6.8 6.7  Total Bilirubin 0.3 - 1.2 mg/dL 0.5 0.4 0.5  Alkaline Phos 38 - 126 U/L 74 76 73  AST 15 - 41 U/L 17 17 19   ALT 0 - 44 U/L 21 22 22    No results found for: UGQ916   Lab Results  Component Value Date   WBC 5.5 07/27/2019   HGB 11.3 (L) 07/27/2019   HCT 35.9 (L) 07/27/2019   MCV 101.4 (H) 07/27/2019   PLT 138 (L) 07/27/2019   NEUTROABS 3.8 07/27/2019    ASSESSMENT & PLAN:  Melanoma of skin ([place])  1.  Lung nodules: -He was an ex-smoker who smoked more than 2 packs/day for many years and quit in 2001. -CT chest in April 2020 showed new lung nodule.  Denies any cough or hemoptysis. -CT scan on 07/27/2019 showed resolution of the previous new lung nodule.  No major changes were seen. -I will see him back in 6 months with repeat CT scan.  2.  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  Denies any new onset pains. -We are following lung nodules with serial CT scans. -No routine scans necessary for surveillance of melanoma.  3.  Mild thrombocytopenia: -He has mild thrombocytopenia of several years duration.  Latest platelet  count is 138.  He does not report any easy bruising or bleeding.  4.  CKD: -Creatinine is stable around 1.76. She does have normocytic anemia with hemoglobin 11.3.  Ferritin is 230 and percent saturation is 30.      Orders Placed This Encounter  Procedures  . CT Chest Wo Contrast    Standing Status:   Future    Standing Expiration Date:   07/28/2020    Order Specific Question:   ** REASON FOR EXAM (FREE TEXT)    Answer:   follow up lung nodules, Hx of melanoma    Order Specific Question:   Preferred imaging location?    Answer:   [facility]    Order Specific Question:   Radiology Contrast Protocol - do NOT remove file path    Answer:   [email or web address]  . CBC with Differential/Platelet    Standing Status:   Future    Standing Expiration Date:   07/28/2020  . Comprehensive metabolic panel    Standing Status:   Future    Standing Expiration Date:   07/28/2020  . Lactate dehydrogenase    Standing Status:   Future    Standing Expiration Date:   07/28/2020  . Iron and TIBC    Standing Status:   Future    Standing Expiration Date:   07/28/2020  . Ferritin    Standing Status:   Future    Standing Expiration Date:   07/28/2020   The patient has a good understanding of the overall plan. he  agrees with it. he will call with any problems that may develop before the next visit here.  Derek Jack, MD 07/31/19

## 2019-08-01 ENCOUNTER — Encounter: Payer: Self-pay | Admitting: Internal Medicine

## 2019-08-10 ENCOUNTER — Ambulatory Visit (INDEPENDENT_AMBULATORY_CARE_PROVIDER_SITE_OTHER): Payer: Medicare Other

## 2019-08-10 DIAGNOSIS — I5022 Chronic systolic (congestive) heart failure: Secondary | ICD-10-CM | POA: Diagnosis not present

## 2019-08-10 DIAGNOSIS — Z95810 Presence of automatic (implantable) cardiac defibrillator: Secondary | ICD-10-CM

## 2019-08-11 NOTE — Progress Notes (Signed)
EPIC Encounter for ICM Monitoring  Patient Name: SOPHIE QUILES is a 78 y.o. male Date: 08/11/2019 Primary Care Physican: Haywood Pao, MD Primary Cardiologist:Nishan/McLean Electrophysiologist: Allred Bi-V Pacing: 98.2% 08/11/2019 Weight: 216.8 lbs  Spoke with patient.  He reports he is doing well and does not have any fluid symptoms.  Optivol thoracic impedancenormal.  Prescribed:Furosemide40 mg1tablet (40 mg total)daily.Potassium 10 mEq take 1 tabletevery other day.  Labs: 01/20/2019 Creatinine 1.76, BUN 25, Potassium 4.5, Sodium 139, GFR 36-42 10/22/2018 Creatinine 1.53, BUN 16, Potassium 4.4, Sodium 139, GFR 43-50  Recommendations:   Encouraged to call if experiencing fluid symptoms.  Follow-up plan: ICM clinic phone appointment on 09/14/2019.   91 day device clinic remote transmission 09/03/2019.   Copy of ICM check sent to Dr. Rayann Heman.   3 month ICM trend: 08/10/2019    1 Year ICM trend:       Rosalene Billings, RN 08/11/2019 2:28 PM

## 2019-08-14 ENCOUNTER — Other Ambulatory Visit: Payer: Self-pay

## 2019-08-14 DIAGNOSIS — Z20822 Contact with and (suspected) exposure to covid-19: Secondary | ICD-10-CM

## 2019-08-17 LAB — NOVEL CORONAVIRUS, NAA: SARS-CoV-2, NAA: NOT DETECTED

## 2019-08-25 ENCOUNTER — Other Ambulatory Visit: Payer: Self-pay

## 2019-08-25 ENCOUNTER — Emergency Department (HOSPITAL_COMMUNITY): Payer: Medicare Other

## 2019-08-25 ENCOUNTER — Emergency Department (HOSPITAL_COMMUNITY)
Admission: EM | Admit: 2019-08-25 | Discharge: 2019-08-25 | Disposition: A | Discharge status: Home / Self Care | Payer: Medicare Other | Attending: Emergency Medicine | Admitting: Emergency Medicine

## 2019-08-25 ENCOUNTER — Encounter (HOSPITAL_COMMUNITY): Payer: Self-pay | Admitting: Emergency Medicine

## 2019-08-25 DIAGNOSIS — Z79899 Other long term (current) drug therapy: Secondary | ICD-10-CM | POA: Insufficient documentation

## 2019-08-25 DIAGNOSIS — I110 Hypertensive heart disease with heart failure: Secondary | ICD-10-CM | POA: Insufficient documentation

## 2019-08-25 DIAGNOSIS — Z87891 Personal history of nicotine dependence: Secondary | ICD-10-CM | POA: Diagnosis not present

## 2019-08-25 DIAGNOSIS — U071 COVID-19: Secondary | ICD-10-CM | POA: Diagnosis not present

## 2019-08-25 DIAGNOSIS — I5022 Chronic systolic (congestive) heart failure: Secondary | ICD-10-CM | POA: Diagnosis not present

## 2019-08-25 DIAGNOSIS — Z20822 Contact with and (suspected) exposure to covid-19: Secondary | ICD-10-CM

## 2019-08-25 DIAGNOSIS — I252 Old myocardial infarction: Secondary | ICD-10-CM | POA: Diagnosis not present

## 2019-08-25 DIAGNOSIS — J069 Acute upper respiratory infection, unspecified: Secondary | ICD-10-CM

## 2019-08-25 DIAGNOSIS — R05 Cough: Secondary | ICD-10-CM | POA: Diagnosis present

## 2019-08-25 LAB — BASIC METABOLIC PANEL
Anion gap: 8 (ref 5–15)
BUN: 18 mg/dL (ref 8–23)
CO2: 26 mmol/L (ref 22–32)
Calcium: 7.8 mg/dL — ABNORMAL LOW (ref 8.9–10.3)
Chloride: 103 mmol/L (ref 98–111)
Creatinine, Ser: 1.62 mg/dL — ABNORMAL HIGH (ref 0.61–1.24)
GFR calc Af Amer: 46 mL/min — ABNORMAL LOW (ref >60)
GFR calc non Af Amer: 40 mL/min — ABNORMAL LOW (ref >60)
Glucose, Bld: 123 mg/dL — ABNORMAL HIGH (ref 70–99)
Potassium: 3.6 mmol/L (ref 3.5–5.1)
Sodium: 137 mmol/L (ref 135–145)

## 2019-08-25 LAB — CBC WITH DIFFERENTIAL/PLATELET
Abs Immature Granulocytes: 0.02 10*3/uL (ref 0.00–0.07)
Basophils Absolute: 0 10*3/uL (ref 0.0–0.1)
Basophils Relative: 1 %
Eosinophils Absolute: 0 10*3/uL (ref 0.0–0.5)
Eosinophils Relative: 0 %
HCT: 31.4 % — ABNORMAL LOW (ref 39.0–52.0)
Hemoglobin: 10.1 g/dL — ABNORMAL LOW (ref 13.0–17.0)
Immature Granulocytes: 1 %
Lymphocytes Relative: 22 %
Lymphs Abs: 0.7 10*3/uL (ref 0.7–4.0)
MCH: 31.7 pg (ref 26.0–34.0)
MCHC: 32.2 g/dL (ref 30.0–36.0)
MCV: 98.4 fL (ref 80.0–100.0)
Monocytes Absolute: 0.3 10*3/uL (ref 0.1–1.0)
Monocytes Relative: 10 %
Neutro Abs: 2.2 10*3/uL (ref 1.7–7.7)
Neutrophils Relative %: 66 %
Platelets: 127 10*3/uL — ABNORMAL LOW (ref 150–400)
RBC: 3.19 MIL/uL — ABNORMAL LOW (ref 4.22–5.81)
RDW: 14.4 % (ref 11.5–15.5)
WBC: 3.3 10*3/uL — ABNORMAL LOW (ref 4.0–10.5)
nRBC: 0 % (ref 0.0–0.2)

## 2019-08-25 LAB — TROPONIN I (HIGH SENSITIVITY)
Troponin I (High Sensitivity): 30 ng/L — ABNORMAL HIGH (ref <18)
Troponin I (High Sensitivity): 27 ng/L — ABNORMAL HIGH (ref <18)

## 2019-08-25 LAB — POC SARS CORONAVIRUS 2 AG -  ED: SARS Coronavirus 2 Ag: NEGATIVE

## 2019-08-25 NOTE — ED Notes (Signed)
Urinal given to patient.

## 2019-08-25 NOTE — Discharge Instructions (Signed)
As we discussed, your initial rapid Covid test was negative.  We have ordered another send out Covid test.  This is pending.  You can check on MyChart regarding your results.  You should quarantine until the results come back.  You can take 1000 mg of Tylenol.  Do not exceed 4000 mg of Tylenol a day.  Make sure you drink plenty of fluids and stay hydrated.  Return emergency department for any worsening symptoms, difficulty breathing, inability eat or drink, vomiting or any other worsening or concerning symptoms.     Person Under Monitoring Name: Chad Brown  Location: [place] [address]   Infection Prevention Recommendations for Individuals Confirmed to have, or Being Evaluated for, 2019 Novel Coronavirus (COVID-19) Infection Who Receive Care at Home  Individuals who are confirmed to have, or are being evaluated for, COVID-19 should follow the prevention steps below until a healthcare provider or local or state health department says they can return to normal activities.  Stay home except to get medical care You should restrict activities outside your home, except for getting medical care. Do not go to work, school, or public areas, and do not use public transportation or taxis.  Call ahead before visiting your doctor Before your medical appointment, call the healthcare provider and tell them that you have, or are being evaluated for, COVID-19 infection. This will help the healthcare providers office take steps to keep other people from getting infected. Ask your healthcare provider to call the local or state health department.  Monitor your symptoms Seek prompt medical attention if your illness is worsening (e.g., difficulty breathing). Before going to your medical appointment, call the healthcare provider and tell them that you have, or are being evaluated for, COVID-19 infection. Ask your healthcare provider to call the local or state health  department.  Wear a facemask You should wear a facemask that covers your nose and mouth when you are in the same room with other people and when you visit a healthcare provider. People who live with or visit you should also wear a facemask while they are in the same room with you.  Separate yourself from other people in your home As much as possible, you should stay in a different room from other people in your home. Also, you should use a separate bathroom, if available.  Avoid sharing household items You should not share dishes, drinking glasses, cups, eating utensils, towels, bedding, or other items with other people in your home. After using these items, you should wash them thoroughly with soap and water.  Cover your coughs and sneezes Cover your mouth and nose with a tissue when you cough or sneeze, or you can cough or sneeze into your sleeve. Throw used tissues in a lined trash can, and immediately wash your hands with soap and water for at least 20 seconds or use an alcohol-based hand rub.  Wash your [facility] your hands often and thoroughly with soap and water for at least 20 seconds. You can use an alcohol-based hand sanitizer if soap and water are not available and if your hands are not visibly dirty. Avoid touching your eyes, nose, and mouth with unwashed hands.   Prevention Steps for Caregivers and Household Members of Individuals Confirmed to have, or Being Evaluated for, COVID-19 Infection Being Cared for in the Home  If you live with, or provide care at home for, a person confirmed to have, or being evaluated for, COVID-19 infection please  follow these guidelines to prevent infection:  Follow healthcare providers instructions Make sure that you understand and can help the patient follow any healthcare provider instructions for all care.  Provide for the patients basic needs You should help the patient with basic needs in the home and provide support for getting  groceries, prescriptions, and other personal needs.  Monitor the patients symptoms If they are getting sicker, call his or her medical provider and tell them that the patient has, or is being evaluated for, COVID-19 infection. This will help the healthcare providers office take steps to keep other people from getting infected. Ask the healthcare provider to call the local or state health department.  Limit the number of people who have contact with the patient  If possible, have only one caregiver for the patient.  Other household members should stay in another home or place of residence. If this is not possible, they should stay  in another room, or be separated from the patient as much as possible. Use a separate bathroom, if available.  Restrict visitors who do not have an essential need to be in the home.  Keep older adults, very young children, and other sick people away from the patient Keep older adults, very young children, and those who have compromised immune systems or chronic health conditions away from the patient. This includes people with chronic heart, lung, or kidney conditions, diabetes, and cancer.  Ensure good ventilation Make sure that shared spaces in the home have good air flow, such as from an air conditioner or an opened window, weather permitting.  Wash your hands often  Wash your hands often and thoroughly with soap and water for at least 20 seconds. You can use an alcohol based hand sanitizer if soap and water are not available and if your hands are not visibly dirty.  Avoid touching your eyes, nose, and mouth with unwashed hands.  Use disposable paper towels to dry your hands. If not available, use dedicated cloth towels and replace them when they become wet.  Wear a facemask and gloves  Wear a disposable facemask at all times in the room and gloves when you touch or have contact with the patients blood, body fluids, and/or secretions or excretions,  such as sweat, saliva, sputum, nasal mucus, vomit, urine, or feces.  Ensure the mask fits over your nose and mouth tightly, and do not touch it during use.  Throw out disposable facemasks and gloves after using them. Do not reuse.  Wash your hands immediately after removing your facemask and gloves.  If your personal clothing becomes contaminated, carefully remove clothing and launder. Wash your hands after handling contaminated clothing.  Place all used disposable facemasks, gloves, and other waste in a lined container before disposing them with other household waste.  Remove gloves and wash your hands immediately after handling these items.  Do not share dishes, glasses, or other household items with the patient  Avoid sharing household items. You should not share dishes, drinking glasses, cups, eating utensils, towels, bedding, or other items with a patient who is confirmed to have, or being evaluated for, COVID-19 infection.  After the person uses these items, you should wash them thoroughly with soap and water.  Wash laundry thoroughly  Immediately remove and wash clothes or bedding that have blood, body fluids, and/or secretions or excretions, such as sweat, saliva, sputum, nasal mucus, vomit, urine, or feces, on them.  Wear gloves when handling laundry from the patient.  Read and  follow directions on labels of laundry or clothing items and detergent. In general, wash and dry with the warmest temperatures recommended on the label.  Clean all areas the individual has used often  Clean all touchable surfaces, such as counters, tabletops, doorknobs, bathroom fixtures, toilets, phones, keyboards, tablets, and bedside tables, every day. Also, clean any surfaces that may have blood, body fluids, and/or secretions or excretions on them.  Wear gloves when cleaning surfaces the patient has come in contact with.  Use a diluted bleach solution (e.g., dilute bleach with 1 part bleach and 10  parts water) or a household disinfectant with a label that says EPA-registered for coronaviruses. To make a bleach solution at home, add 1 tablespoon of bleach to 1 quart (4 cups) of water. For a larger supply, add  cup of bleach to 1 gallon (16 cups) of water.  Read labels of cleaning products and follow recommendations provided on product labels. Labels contain instructions for safe and effective use of the cleaning product including precautions you should take when applying the product, such as wearing gloves or eye protection and making sure you have good ventilation during use of the product.  Remove gloves and wash hands immediately after cleaning.  Monitor yourself for signs and symptoms of illness Caregivers and household members are considered close contacts, should monitor their health, and will be asked to limit movement outside of the home to the extent possible. Follow the monitoring steps for close contacts listed on the symptom monitoring form.   ? If you have additional questions, contact your local health department or call the epidemiologist on call at [phone] (available 24/7). ? This guidance is subject to change. For the most up-to-date guidance from [facility], please refer to their website: [email or web address]

## 2019-08-25 NOTE — ED Provider Notes (Signed)
[facility] EMERGENCY DEPARTMENT Provider Note   CSN: [id number] Arrival date & time: 08/25/19  1317     History   Chief Complaint Chief Complaint  Patient presents with   Cough    HPI Chad Brown is a 78 y.o. male past medical history of anemia, arthritis, CHF, hyperlipidemia, hypertension who presents for evaluation of cough, generalized body aches, fevers, decreased appetite that has been ongoing for last several days.  He reports that last week, his wife and him were tested for COVID-19.  He states that his wife tested positive but he tested negative.  He stated that he had been doing well up until about 4 to 5 days ago when he started having some generalized body aches, malaise, decreased appetite.  He states he also lost his sense of taste and smell.  He states he has had some intermittent episodes of nausea but no vomiting.  He denies any abdominal pain.  He reports yesterday, he had a fever of 101 at home.  He has been taking Tylenol.  His last dose was earlier this morning.  He states he has had a productive cough that is productive of dark sputum but no hemoptysis.  He states occasionally, with coughing he has a little short winded but otherwise no other shortness breath.  Denies any chest pain, abdominal pain, vomiting, numbness/weakness of his arms or legs.  He does not smoke.  He denies any history of COPD or asthma.     The history is provided by the patient.    Past Medical History:  Diagnosis Date   Adenomatous colon polyp    tubular   AICD (automatic cardioverter/defibrillator) present 03/08/2015   MDT CRTD    Anemia    iron deficient   Arthritis    "about all my joints; hands, knees, back" (03/08/2015)   Atherosclerosis    Cataract    left eye small   Cholelithiasis    gallstones   Chronic systolic CHF (congestive heart failure) ([place])    a. New dx 12/2012 ? NICM, may be r/t afib. b. Nuc 03/2013 - normal;  c. 03/2015 TEE EF 15-20%.   Colon polyp,  hyperplastic 5/16   removed precancerous lesions   Depression    Diverticulosis    Glaucoma    right eye   Hyperlipidemia    Hypertension    Melanoma of eye ([place]) 2000's   "right; it's never been biopsied"   Melanoma of lower leg ([place]) 2015   "left; right at my knee"   Myocardial infarction ([place]) 1998   OSA (obstructive sleep apnea) 01/04/2016   uses cpap, pt does not know settings auto set   Peripheral vision loss, right 2006   Persistent atrial fibrillation ([place])    a. Dx 12/2012, s/p TEE/DCCV 01/26/13. b. On Xarelto (CHA2DS2VASc = 3);  c. 03/2015 TEE (EF 15-20%, no LAA thrombus) and DCCV - amio increased to 200 mg bid.   Urinary hesitancy due to benign prostatic hypertrophy     Patient Active Problem List   Diagnosis Date Noted   Peripheral arterial disease ([place]) 05/20/2019   Melanoma of skin ([place]) 07/28/2018   Iron deficiency anemia 05/25/2016   OSA (obstructive sleep apnea) 01/04/2016   Heme positive stool    Atrial fibrillation with RVR ([place]) 67/59/1638   Chronic systolic CHF (congestive heart failure) ([place]) 03/08/2015   Hx of adenomatous colonic polyps 02/10/2015   Pancreatic cyst 02/10/2015   Loose stools 02/10/2015   Encounter for long-term (current)  use of antiplatelets/antithrombotics 01/10/2015   Hypotension 10/04/2014   Nonischemic cardiomyopathy ([place]) 10/04/2014   Hyperglycemia 10/04/2014   Troponin level elevated 10/04/2014   Sleep-disordered breathing 10/04/2014   Obesity 10/04/2014   Anemia, mild 10/04/2014   Thrombocytopenia ([place]) 10/04/2014   Atrial fibrillation with rapid ventricular response ([place]) 10/02/2014   Sinus bradycardia 10/02/2014   Acute on chronic systolic CHF (congestive heart failure) ([place]) 10/02/2014   Skin cancer 07/21/2014   cardiomyopathy 01/24/2013   Atrial fibrillation ([place]) 01/22/2013   Arthritis    Hyperlipidemia    Hypertension    Infected sebaceous cyst 05/15/2012    Past Surgical  History:  Procedure Laterality Date   BACK SURGERY     upper back, cannot turn neck well   [place] N/A 01/26/2013   Procedure: CARDIOVERSION;  Surgeon: Lelon Perla, MD;  Location: [place];  Service: Cardiovascular;  Laterality: N/A;   CARDIOVERSION N/A 03/23/2015   Procedure: CARDIOVERSION;  Surgeon: Jerline Pain, MD;  Location: [place];  Service: Cardiovascular;  Laterality: N/A;   CARDIOVERSION N/A 08/14/2017   Procedure: CARDIOVERSION;  Surgeon: Josue Hector, MD;  Location: [place];  Service: Cardiovascular;  Laterality: N/A;   CATARACT EXTRACTION Right ~ 2006   COLONOSCOPY WITH PROPOFOL N/A 02/10/2015   Procedure: COLONOSCOPY WITH PROPOFOL;  Surgeon: Gatha Mayer, MD;  Location: [facility];  Service: Endoscopy;  Laterality: N/A;   COLONOSCOPY WITH PROPOFOL N/A 08/07/2016   Procedure: COLONOSCOPY WITH PROPOFOL;  Surgeon: Gatha Mayer, MD;  Location: [facility];  Service: Endoscopy;  Laterality: N/A;   ENTEROSCOPY N/A 08/17/2015   Procedure: ENTEROSCOPY;  Surgeon: Gatha Mayer, MD;  Location: [facility];  Service: Endoscopy;  Laterality: N/A;   EP IMPLANTABLE DEVICE N/A 03/08/2015   MDT Hillery Aldo CRT-D for nonischemic CM by Dr Rayann Heman for primary prevention   GLAUCOMA SURGERY Right ~ 2006   "put 3 stents in to drain fluid" (03/08/2015) not successful, sent to [facility] to try to get last stent out   HOT HEMOSTASIS N/A 08/07/2016   Procedure: HOT HEMOSTASIS (ARGON PLASMA COAGULATION/BICAP);  Surgeon: Gatha Mayer, MD;  Location: [facility];  Service: Endoscopy;  Laterality: N/A;   INCISION AND DRAINAGE ABSCESS POSTERIOR CERVICALSPINE  05/2012   JOINT REPLACEMENT     MELANOMA EXCISION Left 2015   "lower leg; right at my knee"   REFRACTIVE SURGERY Right ~ 2006 X 2   "twice; both done at [facility]" (03/08/2015   SURGERY SCROTAL / TESTICULAR Right 1990's   TEE WITHOUT CARDIOVERSION N/A 01/26/2013   Procedure:  TRANSESOPHAGEAL ECHOCARDIOGRAM (TEE);  Surgeon: Lelon Perla, MD;  Location[facility] END[facility]COPY;  Service: Cardiovascular;  Laterality: N/A;  [name] anes. /    TEE WITHOUT CARDIOVERSION N/A 10/05/2014   Procedure: TRANSESOPHAGEAL ECHOCARDIOGRAM (TEE)  with cardioversion;  Surgeon: Thayer Headings, MD;  Location: [facility]COPY;  Service: Cardiovascular;  Laterality: N/A;  12:52 synched cardioversion at 120 joules,...afib to SR...12 lead EKG ordered.Marland KitchenMarland KitchenCardiozem d/c'ed per MD verbal order at SR   TEE WITHOUT CARDIOVERSION N/A 03/23/2015   Procedure: TRANSESOPHAGEAL ECHOCARDIOGRAM (TEE);  Surgeon: Jerline Pain, MD;  Location: [place];  Service: Cardiovascular;  Laterality: N/A;   THORACIC SPINE SURGERY  03/2000   "ground calcium deposits from upper thoracic" (01/26/2013)   TOTAL HIP ARTHROPLASTY Right 06/2007        Home Medications    Prior to Admission medications   Medication Sig Start Date End Date Taking? Authorizing Provider  amiodarone (PACERONE) 200 MG tablet TAKE 1 TABLET DAILY 11/14/18   Patsey Berthold, NP  carvedilol (COREG) 12.5 MG tablet Take 1 tablet (12.5 mg total) by mouth 2 (two) times daily with a meal. 06/29/19   Josue Hector, MD  Coenzyme Q10 (COQ10 GUMMIES ADULT PO) Take 1 tablet at bedtime by mouth.    [provider]  dorzolamide-timolol (COSOPT) 22.3-6.8 MG/ML ophthalmic solution Place 1 drop into the right eye 2 (two) times daily.    [provider]  escitalopram (LEXAPRO) 20 MG tablet Take 1 tablet (20 mg total) by mouth daily. 06/23/19   Bensimhon, Shaune Pascal, MD  ezetimibe (ZETIA) 10 MG tablet Take 1 tablet (10 mg total) by mouth daily. 02/04/19   Josue Hector, MD  ferrous sulfate 325 (65 FE) MG tablet Take 325 mg every other day by mouth.    [provider]  finasteride (PROSCAR) 5 MG tablet Take 1 tablet (5 mg total) by mouth daily. 06/22/19   Bensimhon, Shaune Pascal, MD  furosemide (LASIX) 40 MG tablet Take 1 tablet (40 mg total) by mouth  daily. 12/25/18   Bensimhon, Shaune Pascal, MD  gabapentin (NEURONTIN) 100 MG capsule Take 200 mg 2 (two) times daily by mouth. TAKE 2 TAB BY MOUTH IN THE AM AND 2 TABS BY MOUTH IN THE PM    [provider]  latanoprost (XALATAN) 0.005 % ophthalmic solution Place 1 drop at bedtime into both eyes.    [provider]  losartan (COZAAR) 50 MG tablet TAKE 1 TABLET IN THE MORNING AND 1/2 TABLET IN THE EVENING 03/04/19   Larey Dresser, MD  Netarsudil Dimesylate (RHOPRESSA) 0.02 % SOLN Place 1 drop daily into both eyes.    [provider]  niacin (NIASPAN) 500 MG CR tablet Take 1,000 mg at bedtime by mouth.  09/28/16   [provider]  nitroGLYCERIN (NITROSTAT) 0.4 MG SL tablet Place 0.4 mg under the tongue as needed for chest pain. 12/11/17   [provider]  Polyethyl Glycol-Propyl Glycol (SYSTANE ULTRA OP) Place 1 drop into the left eye 2 (two) times daily.     [provider]  potassium chloride (K-DUR) 10 MEQ tablet Take 1 tablet (10 mEq total) by mouth every other day. Please make annual appt for future refills. Thank you. 04/20/19   Sueanne Margarita, MD  potassium chloride (KLOR-CON) 10 MEQ tablet Take 1 tablet (10 mEq total) by mouth every other day. 07/30/19   Sueanne Margarita, MD  spironolactone (ALDACTONE) 25 MG tablet TAKE (1/2) TABLET DAILY. 12/22/18   Josue Hector, MD  tamsulosin (FLOMAX) 0.4 MG CAPS Take 0.4 mg by mouth at bedtime.     [provider]  XARELTO 20 MG TABS tablet TAKE 1 TABLET ONCE DAILY WITH SUPPER 06/22/19   Larey Dresser, MD    Family History Family History  Problem Relation Age of Onset   Kidney disease Mother    Heart disease Mother        MI, open heart   Diabetes Mother        dialysis   Leukemia Father    Colon cancer Paternal Uncle    Lung cancer Paternal Uncle        x 2   Prostate cancer Paternal Uncle    Diabetes Maternal Grandmother    Heart attack Maternal Uncle    Diabetes Maternal  Aunt        x 3   Diabetes Maternal  Uncle     Social History Social History   Tobacco Use   Smoking status: Former Smoker    Packs/day: 3.00    Years: 48.00    Pack years: 144.00    Types: Cigarettes    Quit date: 09/28/2000    Years since quitting: 18.9   Smokeless tobacco: Never Used  Substance Use Topics   Alcohol use: No   Drug use: No     Allergies   Pravastatin sodium, Azithromycin, and Other   Review of Systems Review of Systems  Constitutional: Positive for appetite change, fatigue and fever.  Respiratory: Positive for cough. Negative for shortness of breath.   Cardiovascular: Negative for chest pain.  Gastrointestinal: Positive for nausea. Negative for abdominal pain and vomiting.  Genitourinary: Negative for dysuria and hematuria.  Musculoskeletal: Positive for myalgias.  Neurological: Negative for headaches.  All other systems reviewed and are negative.    Physical Exam Updated Vital Signs BP (!) 149/67    Pulse (!) 59    Temp 98.3 F (36.8 C) (Oral)    Resp 14    Ht 5\' 6"  (1.676 m)    Wt 96.6 kg    SpO2 94%    BMI 34.38 kg/m   Physical Exam Vitals signs and nursing note reviewed.  Constitutional:      Appearance: Normal appearance. He is well-developed.  HENT:     Head: Normocephalic and atraumatic.  Eyes:     General: Lids are normal.     Conjunctiva/sclera: Conjunctivae normal.     Pupils: Pupils are equal, round, and reactive to light.  Neck:     Musculoskeletal: Full passive range of motion without pain.  Cardiovascular:     Rate and Rhythm: Normal rate and regular rhythm.     Pulses: Normal pulses.     Heart sounds: Normal heart sounds. No murmur. No friction rub. No gallop.   Pulmonary:     Effort: Pulmonary effort is normal.     Breath sounds: Normal breath sounds.     Comments: Lungs clear to auscultation bilaterally.  Symmetric chest rise.  No wheezing, rales, rhonchi. Abdominal:     Palpations: Abdomen is soft. Abdomen is  not rigid.     Tenderness: There is no abdominal tenderness. There is no guarding.     Comments: Abdomen is soft, non-distended, non-tender. No rigidity, No guarding. No peritoneal signs.  Musculoskeletal: Normal range of motion.  Skin:    General: Skin is warm and dry.     Capillary Refill: Capillary refill takes less than 2 seconds.  Neurological:     Mental Status: He is alert and oriented to person, place, and time.  Psychiatric:        Speech: Speech normal.      ED Treatments / Results  Labs (all labs ordered are listed, but only abnormal results are displayed) Labs Reviewed  CBC WITH DIFFERENTIAL/PLATELET - Abnormal; Notable for the following components:      Result Value   WBC 3.3 (*)    RBC 3.19 (*)    Hemoglobin 10.1 (*)    HCT 31.4 (*)    Platelets 127 (*)    All other components within normal limits  BASIC METABOLIC PANEL - Abnormal; Notable for the following components:   Glucose, Bld 123 (*)    Creatinine, Ser 1.62 (*)    Calcium 7.8 (*)    GFR calc non Af Amer 40 (*)    GFR calc Af Amer 46 (*)  All other components within normal limits  TROPONIN I (HIGH SENSITIVITY) - Abnormal; Notable for the following components:   Troponin I (High Sensitivity) 30 (*)    All other components within normal limits  TROPONIN I (HIGH SENSITIVITY) - Abnormal; Notable for the following components:   Troponin I (High Sensitivity) 27 (*)    All other components within normal limits  NOVEL CORONAVIRUS, NAA (HOSP ORDER, SEND-OUT TO REF LAB; TAT 18-24 HRS)  POC SARS CORONAVIRUS 2 AG -  ED  POC SARS CORONAVIRUS 2 AG -  ED    EKG EKG Interpretation  Date/Time:  Tuesday August 25 2019 17:40:57 EST Ventricular Rate:  60 PR Interval:    QRS Duration: 162 QT Interval:  536 QTC Calculation: 536 R Axis:   -139 Text Interpretation: AV dual-paced complexes Right bundle branch block Anterolateral infarct, age indeterminate Confirmed by Ezequiel Essex [phone]) on 08/25/2019  5:45:41 PM   Radiology Dg Chest Portable 1 View  Result Date: 08/25/2019 CLINICAL DATA:  Cough EXAM: PORTABLE CHEST 1 VIEW COMPARISON:  2018 FINDINGS: Suboptimal evaluation due to portable technique with body habitus and respiratory motion. No consolidation or edema identified. No pleural effusion. Cardiomegaly. Left chest wall biventricular ICD is present. IMPRESSION: No acute process in the chest.  Cardiomegaly. Electronically Signed   By: Macy Mis M.D.   On: 08/25/2019 17:16    Procedures Procedures (including critical care time)  Medications Ordered in ED Medications - No data to display   Initial Impression / Assessment and Plan / ED Course  I have reviewed the triage vital signs and the nursing notes.  Pertinent labs & imaging results that were available during my care of the patient were reviewed by me and considered in my medical decision making (see chart for details).        78 year old male who presents for evaluation of decreased appetite, cough, malaise, generalized body aches, fatigue, fever and has been ongoing for several days.  Wife recently tested positive for COVID-19.  On initial ED arrival, afebrile, nontoxic-appearing.  Vital signs are stable.  No evidence of respiratory distress.  Concern for COVID-19 versus viral infectious process.  Plan for x-rays, labs.  CBC shows leukopenia of 3.3.  Hemoglobin is 10.1.  BMP shows BUN of 18, creatinine 1.62.  This is consistent with his baseline.  His troponin is initially elevated at 30.  I reviewed his records.  With previous nonhigh-sensitivity troponins, he had elevated tropes at point 3 0.  Feel that this correlates with his current one.  Will delta troponin though.  Chest x-ray did not show any acute abnormalities.  His rapid Covid was negative.  Given concern COVID-19, will plan for send out test.  Patient ambulated in ED with O2 sats of 97% on room air.  His delta troponin was 27.  He is hemodynamically stable.   He is not any vomiting here in the ED and is afebrile here in the ED.  At this time, I feel that patient is reasonable for outpatient discharge. Discussed patient with Dr. Wyvonnia Dusky.  I discussed this with patient.  Instructed him to follow-up with his results on MyChart.  Instructed him to quarantine until results come back. At this time, patient exhibits no emergent life-threatening condition that require further evaluation in ED or admission. Patient had ample opportunity for questions and discussion. All patient's questions were answered with full understanding. Strict return precautions discussed. Patient expresses understanding and agreement to plan.   Chad Brown was evaluated  in Emergency Department on 08/25/2019 for the symptoms described in the history of present illness. He was evaluated in the context of the global COVID-19 pandemic, which necessitated consideration that the patient might be at risk for infection with the SARS-CoV-2 virus that causes COVID-19. Institutional protocols and algorithms that pertain to the evaluation of patients at risk for COVID-19 are in a state of rapid change based on information released by regulatory bodies including the CDC and federal and state organizations. These policies and algorithms were followed during the patient's care in the ED.  Portions of this note were generated with [employer]. Dictation errors may occur despite best attempts at proofreading.   Final Clinical Impressions(s) / ED Diagnoses   Final diagnoses:  Viral URI with cough  Suspected COVID-19 virus infection    ED Discharge Orders    None       Desma Mcgregor 08/25/19 2213    Ezequiel Essex, MD 08/26/19 1611

## 2019-08-25 NOTE — ED Notes (Signed)
Ambulated patient, steady gait, denies dizziness at this time.  Oxygen saturation 97% while sitting and while ambulating.

## 2019-08-25 NOTE — ED Triage Notes (Signed)
PT states wife test positive for Covid x2 weeks ago and he was negative. PT stated about a week ago started having no smell or taste and started feeling body aches, fevers of 101, no appetite and generalized malaise x3 days.

## 2019-08-26 LAB — NOVEL CORONAVIRUS, NAA (HOSP ORDER, SEND-OUT TO REF LAB; TAT 18-24 HRS): SARS-CoV-2, NAA: DETECTED — AB

## 2019-09-03 ENCOUNTER — Other Ambulatory Visit (HOSPITAL_COMMUNITY): Payer: Self-pay

## 2019-09-03 ENCOUNTER — Ambulatory Visit (INDEPENDENT_AMBULATORY_CARE_PROVIDER_SITE_OTHER): Payer: Medicare Other | Admitting: *Deleted

## 2019-09-03 DIAGNOSIS — I5022 Chronic systolic (congestive) heart failure: Secondary | ICD-10-CM | POA: Diagnosis not present

## 2019-09-03 LAB — CUP PACEART REMOTE DEVICE CHECK
Battery Remaining Longevity: 36 mo
Battery Voltage: 2.94 V
Brady Statistic AP VP Percent: 97.78 %
Brady Statistic AP VS Percent: 1.58 %
Brady Statistic AS VP Percent: 0.59 %
Brady Statistic AS VS Percent: 0.05 %
Brady Statistic RA Percent Paced: 99.35 %
Brady Statistic RV Percent Paced: 8.23 %
Date Time Interrogation Session: 20201203012305
HighPow Impedance: 68 Ohm
Implantable Lead Implant Date: 20160607
Implantable Lead Implant Date: 20160607
Implantable Lead Implant Date: 20160607
Implantable Lead Location: 753858
Implantable Lead Location: 753859
Implantable Lead Location: 753860
Implantable Lead Model: 4598
Implantable Lead Model: 5076
Implantable Pulse Generator Implant Date: 20160607
Lead Channel Impedance Value: 304 Ohm
Lead Channel Impedance Value: 323 Ohm
Lead Channel Impedance Value: 342 Ohm
Lead Channel Impedance Value: 437 Ohm
Lead Channel Impedance Value: 532 Ohm
Lead Channel Impedance Value: 570 Ohm
Lead Channel Impedance Value: 570 Ohm
Lead Channel Impedance Value: 779 Ohm
Lead Channel Impedance Value: 779 Ohm
Lead Channel Impedance Value: 779 Ohm
Lead Channel Impedance Value: 817 Ohm
Lead Channel Impedance Value: 969 Ohm
Lead Channel Impedance Value: 969 Ohm
Lead Channel Pacing Threshold Amplitude: 0.625 V
Lead Channel Pacing Threshold Amplitude: 0.75 V
Lead Channel Pacing Threshold Amplitude: 0.875 V
Lead Channel Pacing Threshold Pulse Width: 0.4 ms
Lead Channel Pacing Threshold Pulse Width: 0.4 ms
Lead Channel Pacing Threshold Pulse Width: 0.4 ms
Lead Channel Sensing Intrinsic Amplitude: 11 mV
Lead Channel Sensing Intrinsic Amplitude: 11 mV
Lead Channel Sensing Intrinsic Amplitude: 2 mV
Lead Channel Sensing Intrinsic Amplitude: 2 mV
Lead Channel Setting Pacing Amplitude: 1.5 V
Lead Channel Setting Pacing Amplitude: 2 V
Lead Channel Setting Pacing Amplitude: 2.5 V
Lead Channel Setting Pacing Pulse Width: 0.4 ms
Lead Channel Setting Pacing Pulse Width: 0.4 ms
Lead Channel Setting Sensing Sensitivity: 0.3 mV

## 2019-09-03 MED ORDER — LOSARTAN POTASSIUM 25 MG PO TABS: 25.0000 mg | ORAL_TABLET | Freq: Two times a day (BID) | ORAL | 1 refills | Status: DC

## 2019-09-16 ENCOUNTER — Telehealth: Payer: Self-pay

## 2019-09-16 NOTE — Telephone Encounter (Signed)
Left message for patient to remind of missed remote transmission. 12/15

## 2019-09-23 NOTE — Progress Notes (Signed)
No ICM remote transmission received for 09/14/2019 and next ICM transmission scheduled for 10/26/2019.

## 2019-09-29 NOTE — Progress Notes (Signed)
ICD remote 

## 2019-10-09 ENCOUNTER — Encounter: Payer: Self-pay | Admitting: Cardiology

## 2019-10-09 ENCOUNTER — Other Ambulatory Visit: Payer: Self-pay

## 2019-10-09 ENCOUNTER — Telehealth: Payer: Self-pay | Admitting: *Deleted

## 2019-10-09 ENCOUNTER — Telehealth (INDEPENDENT_AMBULATORY_CARE_PROVIDER_SITE_OTHER): Payer: Medicare PPO | Admitting: Cardiology

## 2019-10-09 VITALS — BP 128/74 | Temp 97.2°F | Ht 66.0 in | Wt 213.4 lb

## 2019-10-09 DIAGNOSIS — G4733 Obstructive sleep apnea (adult) (pediatric): Secondary | ICD-10-CM

## 2019-10-09 DIAGNOSIS — I10 Essential (primary) hypertension: Secondary | ICD-10-CM

## 2019-10-09 NOTE — Telephone Encounter (Signed)
Order placed to [email or web address] community message 

## 2019-10-09 NOTE — Telephone Encounter (Signed)
-----   Message from Sueanne Margarita, MD sent at 10/09/2019  1:55 PM EST ----- Please order a REspironics Dreamward under the nose FFM - - his mask is causing bleeding over the bridge of his nose and he is on blood thinners as well.  Get a download in 6 weeks. Followup with  me in 3 months

## 2019-10-09 NOTE — Progress Notes (Signed)
Virtual Visit via Telephone Note   This visit type was conducted due to national recommendations for restrictions regarding the COVID-19 Pandemic (e.g. social distancing) in an effort to limit this patient's exposure and mitigate transmission in our community.  Due to his co-morbid illnesses, this patient is at least at moderate risk for complications without adequate follow up.  This format is felt to be most appropriate for this patient at this time.  The patient did not have access to video technology/had technical difficulties with video requiring transitioning to audio format only (telephone).  All issues noted in this document were discussed and addressed.  No physical exam could be performed with this format.  Please refer to the patient's chart for his  consent to telehealth for [facility].   Evaluation Performed:  Follow-up visit  This visit type was conducted due to national recommendations for restrictions regarding the COVID-19 Pandemic (e.g. social distancing).  This format is felt to be most appropriate for this patient at this time.  All issues noted in this document were discussed and addressed.  No physical exam was performed (except for noted visual exam findings with Video Visits).  Please refer to the patient's chart (MyChart message for video visits and phone note for telephone visits) for the patient's consent to telehealth for [facility].  Date:  10/09/2019   ID:  [name], DOB 1941/07/03, MRN [id number]  Patient Location:  Home  Provider location:   [place]  PCP:  Tisovec, Fransico Him, MD  Cardiologist:  [name], MD  Sleep Medicine:  Fransico Him, MD Electrophysiologist:  None   Chief Complaint:  OSA  History of Present Illness:    Chad Brown is a 79 y.o. male who presents via audio/video conferencing for a telehealth visit today.    Chad Brown is a 79 y.o. male with a hx of mild OSA with an AHI of 12/hr and oxygen desaturations  as low as 73%and is onCPAPat14cm H2O.  He had been doing well with his CPAP device but stopped using it when he got COVID and never started back on it.  He says that he needs a new mask.  The mask has been irritating the bridge of his nose and his nose would bleed due to him being on DOAC.  He feels the pressure is adequate.  When he uses the device he feels rested in the am and has no significant daytime sleepiness.  He has also had some problems with dry mouth event with turning up the humidity.  He does not think that he snores.  The patient does not have symptoms concerning for COVID-19 infection (fever, chills, cough, or new shortness of breath).   Prior CV studies:   The following studies were reviewed today:  PAP compliance download  Past Medical History:  Diagnosis Date  . Adenomatous colon polyp    tubular  . AICD (automatic cardioverter/defibrillator) present 03/08/2015   MDT CRTD   . Anemia    iron deficient  . Arthritis    "about all my joints; hands, knees, back" (03/08/2015)  . Atherosclerosis   . Cataract    left eye small  . Cholelithiasis    gallstones  . Chronic systolic CHF (congestive heart failure) ([place])    a. New dx 12/2012 ? NICM, may be r/t afib. b. Nuc 03/2013 - normal;  c. 03/2015 TEE EF 15-20%.  . Colon polyp, hyperplastic 5/16   removed precancerous lesions  . Depression   .  Diverticulosis   . Glaucoma    right eye  . Hyperlipidemia   . Hypertension   . Melanoma of eye ([place]) 2000's   "right; it's never been biopsied"  . Melanoma of lower leg ([place]) 2015   "left; right at my knee"  . Myocardial infarction ([place]) 1998  . OSA (obstructive sleep apnea) 01/04/2016   uses cpap, pt does not know settings auto set  . Peripheral vision loss, right 2006  . Persistent atrial fibrillation ([place])    a. Dx 12/2012, s/p TEE/DCCV 01/26/13. b. On Xarelto (CHA2DS2VASc = 3);  c. 03/2015 TEE (EF 15-20%, no LAA thrombus) and DCCV - amio increased to 200 mg bid.  . Urinary  hesitancy due to benign prostatic hypertrophy    Past Surgical History:  Procedure Laterality Date  . BACK SURGERY     upper back, cannot turn neck well  . CARDIAC CATHETERIZATION  1998  . CARDIOVERSION N/A 01/26/2013   Procedure: CARDIOVERSION;  Surgeon: Lelon Perla, MD;  Location: [facility] ENDOSCOPY;  Service: Cardiovascular;  Laterality: N/A;  . CARDIOVERSION N/A 03/23/2015   Procedure: CARDIOVERSION;  Surgeon: Jerline Pain, MD;  Location: [facility] ENDOSCOPY;  Service: Cardiovascular;  Laterality: N/A;  . CARDIOVERSION N/A 08/14/2017   Procedure: CARDIOVERSION;  Surgeon: Josue Hector, MD;  Location: [place];  Service: Cardiovascular;  Laterality: N/A;  . CATARACT EXTRACTION Right ~ 2006  . COLONOSCOPY WITH PROPOFOL N/A 02/10/2015   Procedure: COLONOSCOPY WITH PROPOFOL;  Surgeon: Gatha Mayer, MD;  Location: [facility] ENDOSCOPY;  Service: Endoscopy;  Laterality: N/A;  . COLONOSCOPY WITH PROPOFOL N/A 08/07/2016   Procedure: COLONOSCOPY WITH PROPOFOL;  Surgeon: Gatha Mayer, MD;  Location: [facility] ENDOSCOPY;  Service: Endoscopy;  Laterality: N/A;  . ENTEROSCOPY N/A 08/17/2015   Procedure: ENTEROSCOPY;  Surgeon: Gatha Mayer, MD;  Location: [facility] ENDOSCOPY;  Service: Endoscopy;  Laterality: N/A;  . EP IMPLANTABLE DEVICE N/A 03/08/2015   MDT Hillery Aldo CRT-D for nonischemic CM by Dr Rayann Heman for primary prevention  . GLAUCOMA SURGERY Right ~ 2006   "put 3 stents in to drain fluid" (03/08/2015) not successful, sent to [facility] to try to get last stent out  . HOT HEMOSTASIS N/A 08/07/2016   Procedure: HOT HEMOSTASIS (ARGON PLASMA COAGULATION/BICAP);  Surgeon: Gatha Mayer, MD;  Location: [facility] ENDOS[facility];  Service: Endoscopy;  Laterality: N/A;  . INCISION AND DRAINAGE ABSCESS POSTERIOR CERVICALSPINE  05/2012  . JOINT REPLACEMENT    . MELANOMA EXCISION Left 2015   "lower leg; right at my knee"  . REFRACTIVE SURGERY Right ~ 2006 X 2   "twice; both done at [facility]" (03/08/2015  . SURGERY SCROTAL / TESTICULAR Right 1990's   . TEE WITHOUT CARDIOVERSION N/A 01/26/2013   Procedure: TRANSESOPHAGEAL ECHOCARDIOGRAM (TEE);  Surgeon: Lelon Perla, MD;  Location: [place];  Service: Cardiovascular;  Laterality: N/A;  [name] anes. /   . TEE WITHOUT CARDIOVERSION N/A 10/05/2014   Procedure: TRANSESOPHAGEAL ECHOCARDIOGRAM (TEE)  with cardioversion;  Surgeon: Thayer Headings, MD;  Location[facility] ENDOSCOPY;  Service: Cardiovascular;  Laterality: N/A;  12:52 synched cardioversion at 120 joules,...afib to SR...12 lead EKG ordered.Marland KitchenMarland KitchenCardiozem d/c'ed per MD verbal order at SR  . TEE WITHOUT CARDIOVERSION N/A 03/23/2015   Procedure: TRANSESOPHAGEAL ECHOCARDIOGRAM (TEE);  Surgeon: Jerline Pain, MD;  Location: [facility] ENDOSCOPY;  Service: Cardiovascular;  Laterality: N/A;  . THORACIC SPINE SURGERY  03/2000   "ground calcium deposits from upper thoracic" (01/26/2013)  . TOTAL HIP ARTHROPLASTY Right 06/2007  Current Meds  Medication Sig  . amiodarone (PACERONE) 200 MG tablet TAKE 1 TABLET DAILY  . carvedilol (COREG) 12.5 MG tablet Take 1 tablet (12.5 mg total) by mouth 2 (two) times daily with a meal.  . Coenzyme Q10 (COQ10 GUMMIES ADULT PO) Take 1 tablet at bedtime by mouth.  . dorzolamide-timolol (COSOPT) 22.3-6.8 MG/ML ophthalmic solution Place 1 drop into the right eye 2 (two) times daily.  [name] escitalopram (LEXAPRO) 20 MG tablet Take 1 tablet (20 mg total) by mouth daily.  [name] ezetimibe (ZETIA) 10 MG tablet Take 1 tablet (10 mg total) by mouth daily.  . ferrous sulfate 325 (65 FE) MG tablet Take 325 mg every other day by mouth.  . finasteride (PROSCAR) 5 MG tablet Take 1 tablet (5 mg total) by mouth daily.  . furosemide (LASIX) 40 MG tablet Take 1 tablet (40 mg total) by mouth daily.  [name] gabapentin (NEURONTIN) 100 MG capsule Take 200 mg 2 (two) times daily by mouth. TAKE 2 TAB BY MOUTH IN THE AM AND 2 TABS BY MOUTH IN THE PM  . latanoprost (XALATAN) 0.005 % ophthalmic solution Place 1 drop into the right eye at bedtime.   [name] losartan  (COZAAR) 50 MG tablet Take 50 mg by mouth 2 (two) times daily.  Mckinley Jewel Dimesylate (RHOPRESSA) 0.02 % SOLN Place 1 drop daily into both eyes.  . niacin (NIASPAN) 500 MG CR tablet Take 1,000 mg at bedtime by mouth.   . nitroGLYCERIN (NITROSTAT) 0.4 MG SL tablet Place 0.4 mg under the tongue as needed for chest pain.  [name] Glycol-Propyl Glycol (SYSTANE ULTRA OP) Place 1 drop into the left eye 2 (two) times daily.   . potassium chloride (K-DUR) 10 MEQ tablet Take 1 tablet (10 mEq total) by mouth every other day. Please make annual appt for future refills. Thank you.  . spironolactone (ALDACTONE) 25 MG tablet TAKE (1/2) TABLET DAILY.  . tamsulosin (FLOMAX) 0.4 MG CAPS Take 0.4 mg by mouth at bedtime.   Alveda Reasons 20 MG TABS tablet TAKE 1 TABLET ONCE DAILY WITH SUPPER     Allergies:   Pravastatin sodium, Azithromycin, and Other   Social History   Tobacco Use  . Smoking status: Former Smoker    Packs/day: 3.00    Years: 48.00    Pack years: 144.00    Types: Cigarettes    Quit date: 09/28/2000    Years since quitting: 19.0  . Smokeless tobacco: Never Used  Substance Use Topics  . Alcohol use: No  . Drug use: No     Family Hx: The patient's family history includes Colon cancer in his paternal uncle; Diabetes in his maternal aunt, maternal grandmother, maternal uncle, and mother; Heart attack in his maternal uncle; Heart disease in his mother; Kidney disease in his mother; Leukemia in his father; Lung cancer in his paternal uncle; Prostate cancer in his paternal uncle.  ROS:   Please see the history of present illness.     All other systems reviewed and are negative.   Labs/Other Tests and Data Reviewed:    Recent Labs: 10/22/2018: TSH 3.135 07/27/2019: ALT 21 08/25/2019: BUN 18; Creatinine, Ser 1.62; Hemoglobin 10.1; Platelets 127; Potassium 3.6; Sodium 137   Recent Lipid Panel Lab Results  Component Value Date/Time   CHOL 191 10/22/2018 10:40 AM   TRIG 123  10/22/2018 10:40 AM   HDL 38 (L) 10/22/2018 10:40 AM   CHOLHDL 5.0 10/22/2018 10:40 AM   LDLCALC 128 (H) 10/22/2018 10:40  AM    Wt Readings from Last 3 Encounters:  10/09/19 213 lb 6.4 oz (96.8 kg)  08/25/19 213 lb (96.6 kg)  07/29/19 223 lb 9.6 oz (101.4 kg)     Objective:    Vital Signs:  BP 128/74   Temp (!) 97.2 F (36.2 C)   Ht 5\' 6"  (1.676 m)   Wt 213 lb 6.4 oz (96.8 kg)   BMI 34.44 kg/m     ASSESSMENT & PLAN:    1.  OSA -  He stopped using his device a few months ago when he got COVID and never started back.  He has had problems with the bridge of his nose getting irritated from the mask and will bleed as he is on DOAC.  I will order him a Respironics under the nose Dreamwear mask and see if he tolerates this better.  I will get a download in 6 weeks.   2. HTN -BP controlled -continue Carvedilol 12.5mg  BID, Losartan 25mg  daily and Spiro 25mg  daily  3.  Obesity -I have encouraged him to continue with his exercise program and cut back on carbs and portions.      COVID-19 Education: The signs and symptoms of COVID-19 were discussed with the patient and how to seek care for testing (follow up with PCP or arrange E-visit).  The importance of social distancing was discussed today.  Patient Risk:   After full review of this patient's clinical status, I feel that they are at least moderate risk at this time.  Time:   Today, I have spent 20 minutes  on telemedicine discussing medical problems including OSA< HTN, Obesity.  We also reviewed the symptoms of COVID 19 and the ways to protect against contracting the virus with telehealth technology.  I spent an additional 5 minutes reviewing patient's chart including PAP compliance download.  Medication Adjustments/Labs and Tests Ordered: Current medicines are reviewed at length with the patient today.  Concerns regarding medicines are outlined above.  Tests Ordered: No orders of the defined types were placed in this  encounter.  Medication Changes: No orders of the defined types were placed in this encounter.   Disposition:  Follow up with me virtual in 3 months  Signed, Fransico Him, MD  10/09/2019 1:49 PM    [place]

## 2019-10-26 ENCOUNTER — Ambulatory Visit (INDEPENDENT_AMBULATORY_CARE_PROVIDER_SITE_OTHER): Payer: Medicare PPO

## 2019-10-26 DIAGNOSIS — Z95810 Presence of automatic (implantable) cardiac defibrillator: Secondary | ICD-10-CM

## 2019-10-26 DIAGNOSIS — I5022 Chronic systolic (congestive) heart failure: Secondary | ICD-10-CM

## 2019-10-26 DIAGNOSIS — I4819 Other persistent atrial fibrillation: Secondary | ICD-10-CM

## 2019-10-26 MED ORDER — FUROSEMIDE 40 MG PO TABS: ORAL_TABLET | ORAL | 3 refills | Status: DC

## 2019-10-26 MED ORDER — AMIODARONE HCL 200 MG PO TABS: ORAL_TABLET | ORAL | 3 refills | Status: DC

## 2019-10-26 NOTE — Progress Notes (Signed)
Spoke with patient and provided Dr Claris Gladden recommendations:  1. Increase Lasix to 40 mg bid x 4 days then 40 qam/20 qpm with BMET ordered.  He will have BMET drawn at [facility] on 11/02/19.  2. Increase amiodarone to 200 mg bid x 1 week.   3. HF clinic will contact him to schedule an appointment this week with Dr Aundra Dubin  Patient repeated instructions back correctly and verbalized understanding.   He has enough medication to increase dosages through this week and then will order refill.  Advised to call the pharmacy when the refill is needed.   Advised Dr Aundra Dubin sent Dr Rayann Heman regarding his recommendations.  Message sent to Dr Rayann Heman providing Dr Claris Gladden recommendations.

## 2019-10-26 NOTE — Progress Notes (Signed)
EPIC Encounter for ICM Monitoring  Patient Name: Chad Brown is a 79 y.o. male Date: 10/26/2019 Primary Care Physican: Haywood Pao, MD Primary Cardiologist:Nishan/McLean Electrophysiologist: Allred Bi-V Pacing: 89.5% (decreased from 98.1on 09/03/19 report) 10/26/2019 Weight: 220lbs  AT/AF                          62 episodes  Time in AT/AF  6.4 hr/day (26.6%): previous report 09/01/19 was <0.1 hr/day (<0.1%)  Longest AT/AF  3 days   Event Summary: ?    AT/AF Daily Burden > Threshold ?    Fast V Rate During AT/AF ?    TriageHFT (HF Risk) Alert: High (New) ?    Possible Fluid Accumulation ?    V. Pacing < 90% ?    Night Heart Rate > 85 bpm ?    3217 V. Sensing Episodes ?    14 days in AT/AF Since Last Session  Spoke with patient. He reports SOB and 7-8 pound weight gain since impedance decrease on 1/11.  He has not missed any medication dosages.  Confirmed he continues to take Amiodarone 200 mg daily and Xarelto daily.   Optivol thoracic impedance suggesting possible ongoing fluid accumulation since 10/12/2019.  Prescribed:Furosemide40 mg1tablet (40 mg total)daily.Potassium 10 mEq take 1 tabletevery other day.  Labs: 08/25/2019 Creatinine 1.62, BUN 18, Potassium 3.6, Sodium 137, GFR 40-46 07/27/2019 Creatinine 1.76, BUN 21, Potassium 4.7, Sodium 141, GF Rr 36-42 01/20/2019 Creatinine 1.76, BUN 25, Potassium 4.5, Sodium 139, GFR 36-42 10/22/2018 Creatinine 1.53, BUN 16, Potassium 4.4, Sodium 139, GFR 43-50  Recommendations:  Advised message sent to Device clinic to review 1/25 report, copy sent to Dr Aundra Dubin for review and recommendations.    Follow-up plan: ICM clinic phone appointment on 11/04/2019 to recheck fluid levels.   91 day device clinic remote transmission 12/03/2019.   Last HF office visit was 10/2018.    Copy of ICM check sent to Dr. Rayann Heman and Dr Aundra Dubin.   3 month ICM trend: 10/26/2019    1 Year ICM trend:       Rosalene Billings,  RN 10/26/2019 1:28 PM

## 2019-10-26 NOTE — Progress Notes (Signed)
Increase Lasix to 40 mg bid x 4 days then 40 qam/20 qpm with BMET 1 week.  Having more atrial fibrillation.  Have him increase amiodarone to 200 mg bid x 1 week.   Followup in the office with me this week, can work in.  He is due for appt.  Would ask Dr. Rayann Heman to comment on fitness for atrial fibrillation ablation, tolerates afib poorly.

## 2019-10-27 ENCOUNTER — Ambulatory Visit (HOSPITAL_COMMUNITY)
Admission: RE | Admit: 2019-10-27 | Discharge: 2019-10-27 | Disposition: A | Discharge status: Home / Self Care | Payer: Medicare PPO | Source: Ambulatory Visit | Attending: Cardiology | Admitting: Cardiology

## 2019-10-27 ENCOUNTER — Other Ambulatory Visit: Payer: Self-pay

## 2019-10-27 ENCOUNTER — Inpatient Hospital Stay (HOSPITAL_COMMUNITY)
Admission: RE | Admit: 2019-10-27 | Discharge: 2019-10-27 | Disposition: A | Discharge status: Home / Self Care | Payer: Medicare PPO | Source: Ambulatory Visit

## 2019-10-27 ENCOUNTER — Other Ambulatory Visit (HOSPITAL_COMMUNITY): Payer: Self-pay

## 2019-10-27 ENCOUNTER — Encounter (HOSPITAL_COMMUNITY): Payer: Self-pay | Admitting: Cardiology

## 2019-10-27 VITALS — BP 130/88 | HR 103 | Wt 225.4 lb

## 2019-10-27 DIAGNOSIS — Z7901 Long term (current) use of anticoagulants: Secondary | ICD-10-CM | POA: Diagnosis not present

## 2019-10-27 DIAGNOSIS — I4819 Other persistent atrial fibrillation: Secondary | ICD-10-CM

## 2019-10-27 DIAGNOSIS — Z85820 Personal history of malignant melanoma of skin: Secondary | ICD-10-CM | POA: Insufficient documentation

## 2019-10-27 DIAGNOSIS — Z79899 Other long term (current) drug therapy: Secondary | ICD-10-CM | POA: Insufficient documentation

## 2019-10-27 DIAGNOSIS — G4733 Obstructive sleep apnea (adult) (pediatric): Secondary | ICD-10-CM | POA: Insufficient documentation

## 2019-10-27 DIAGNOSIS — I739 Peripheral vascular disease, unspecified: Secondary | ICD-10-CM | POA: Diagnosis not present

## 2019-10-27 DIAGNOSIS — N189 Chronic kidney disease, unspecified: Secondary | ICD-10-CM | POA: Diagnosis not present

## 2019-10-27 DIAGNOSIS — Z87891 Personal history of nicotine dependence: Secondary | ICD-10-CM | POA: Insufficient documentation

## 2019-10-27 DIAGNOSIS — I480 Paroxysmal atrial fibrillation: Secondary | ICD-10-CM | POA: Insufficient documentation

## 2019-10-27 DIAGNOSIS — I5022 Chronic systolic (congestive) heart failure: Secondary | ICD-10-CM | POA: Insufficient documentation

## 2019-10-27 DIAGNOSIS — I428 Other cardiomyopathies: Secondary | ICD-10-CM | POA: Insufficient documentation

## 2019-10-27 DIAGNOSIS — Z8249 Family history of ischemic heart disease and other diseases of the circulatory system: Secondary | ICD-10-CM | POA: Insufficient documentation

## 2019-10-27 DIAGNOSIS — E785 Hyperlipidemia, unspecified: Secondary | ICD-10-CM | POA: Diagnosis not present

## 2019-10-27 LAB — COMPREHENSIVE METABOLIC PANEL
ALT: 15 U/L (ref 0–44)
AST: 11 U/L — ABNORMAL LOW (ref 15–41)
Albumin: 3.2 g/dL — ABNORMAL LOW (ref 3.5–5.0)
Alkaline Phosphatase: 68 U/L (ref 38–126)
Anion gap: 6 (ref 5–15)
BUN: 18 mg/dL (ref 8–23)
CO2: 28 mmol/L (ref 22–32)
Calcium: 7.9 mg/dL — ABNORMAL LOW (ref 8.9–10.3)
Chloride: 104 mmol/L (ref 98–111)
Creatinine, Ser: 1.89 mg/dL — ABNORMAL HIGH (ref 0.61–1.24)
GFR calc Af Amer: 39 mL/min — ABNORMAL LOW (ref >60)
GFR calc non Af Amer: 33 mL/min — ABNORMAL LOW (ref >60)
Glucose, Bld: 113 mg/dL — ABNORMAL HIGH (ref 70–99)
Potassium: 3.8 mmol/L (ref 3.5–5.1)
Sodium: 138 mmol/L (ref 135–145)
Total Bilirubin: 0.7 mg/dL (ref 0.3–1.2)
Total Protein: 6.1 g/dL — ABNORMAL LOW (ref 6.5–8.1)

## 2019-10-27 LAB — LIPID PANEL
Cholesterol: 167 mg/dL (ref 0–200)
HDL: 32 mg/dL — ABNORMAL LOW (ref >40)
LDL Cholesterol: 115 mg/dL — ABNORMAL HIGH (ref 0–99)
Total CHOL/HDL Ratio: 5.2 RATIO
Triglycerides: 100 mg/dL (ref <150)
VLDL: 20 mg/dL (ref 0–40)

## 2019-10-27 LAB — CBC
HCT: 30.3 % — ABNORMAL LOW (ref 39.0–52.0)
Hemoglobin: 9.8 g/dL — ABNORMAL LOW (ref 13.0–17.0)
MCH: 32.1 pg (ref 26.0–34.0)
MCHC: 32.3 g/dL (ref 30.0–36.0)
MCV: 99.3 fL (ref 80.0–100.0)
Platelets: 121 10*3/uL — ABNORMAL LOW (ref 150–400)
RBC: 3.05 MIL/uL — ABNORMAL LOW (ref 4.22–5.81)
RDW: 14.9 % (ref 11.5–15.5)
WBC: 4.6 10*3/uL (ref 4.0–10.5)
nRBC: 0 % (ref 0.0–0.2)

## 2019-10-27 LAB — TSH: TSH: 3.441 u[IU]/mL (ref 0.350–4.500)

## 2019-10-27 LAB — PROTIME-INR
INR: 2.5 — ABNORMAL HIGH (ref 0.8–1.2)
Prothrombin Time: 27 seconds — ABNORMAL HIGH (ref 11.4–15.2)

## 2019-10-27 LAB — BRAIN NATRIURETIC PEPTIDE: B Natriuretic Peptide: 578.2 pg/mL — ABNORMAL HIGH (ref 0.0–100.0)

## 2019-10-27 MED ORDER — POTASSIUM CHLORIDE CRYS ER 10 MEQ PO TBCR: 20.0000 meq | EXTENDED_RELEASE_TABLET | Freq: Every day | ORAL | 5 refills | Status: DC

## 2019-10-27 NOTE — Patient Instructions (Addendum)
INCREASE Lasix to 80mg  (2 tabs) in the morning and 40mg  (1 tab) in the evening for 3 days THEN back down to 40mg  (1 tab) twice a day  INCREASE Potassium to 20 meq (2 tabs) daily  INCREASE Amiodarone to 200mg  (1 tab) twice a day until cardioversion.  You will receive further instructions after procedure on how to proceed.  Labs today We will only contact you if something comes back abnormal or we need to make some changes. Otherwise no news is good news!   You have been referred to Dr Rayann Heman for an ablation.  They will call you to schedule your appointment.  Your physician has requested that you have an echocardiogram. Echocardiography is a painless test that uses sound waves to create images of your heart. It provides your doctor with information about the size and shape of your heart and how well your heart's chambers and valves are working. This procedure takes approximately one hour. There are no restrictions for this procedure.  Your physician recommends that you schedule a follow-up appointment in: 2 weeks with an ECHO and visit with Dr Aundra Dubin.    Please call office at [phone] option [phone] if you have any questions or concerns.       Dear Chad Brown  You are scheduled for a Cardioversion on Friday January 29th, 2021 with Dr. Aundra Dubin.  Please arrive at the [facility] (Main Entrance A) at [facility] [address] [place], [place] [address] at 6 am.   (1 hour prior to procedure unless lab work is needed; if lab work is needed arrive 1.5 hours ahead)  DIET: Nothing to eat or drink after midnight except a sip of water with medications (see medication instructions below)   Medication Instructions: Hold ALL medications except for Xarelto.  Continue your anticoagulant: Xarelto You will need to continue your anticoagulant after your procedure until you are told by your provider that it is safe to stop   Labs: Done today in office  You will need a pre procedure  COVID test    WHEN:  Today at 1:20pm WHERE: [facility]  [place] [address]  This is a drive thru testing site, you will remain in your car. Be sure to get in the line FOR PROCEDURES Once you have been swabbed you will need to remain home in quarantine until you return for your procedure.   You must have a responsible person to drive you home and stay in the waiting area during your procedure. Failure to do so could result in cancellation.  Bring your insurance cards.  *Special Note: Every effort is made to have your procedure done on time. Occasionally there are emergencies that occur at the hospital that may cause delays. Please be patient if a delay does occur.

## 2019-10-27 NOTE — Progress Notes (Signed)
Patient tested COVID positive on 08/25/19.  Patient does not need to be retested prior to procedure per the 90-day retest protocol.

## 2019-10-27 NOTE — Progress Notes (Signed)
Patient ID: Chad Brown, male   DOB: 1941/01/18, 79 y.o.   MRN: [id number] PCP: Edrick Oh HF Cardiologist: Aundra Dubin  79 y.o. with paroxysmal atrial fibrillation and chronic systolic CHF thought to be due to nonischemic cardiomyopathy presents for followup of CHF. He has a cardiac history dating back to 1998, when he was admitted with chest pain and had cardiac cath showing nonobstructive CAD. In 4/14, he was found to be in atrial fibrillation.  Echo showed EF 30-35%.  He had DCCV back to NSR.  He was back in atrial fibrillation in 1/16, and EF was low again on TEE at that time.   Again, he had DCCV.  In 5/16, cardiac MRI showed persistently low EF, so given LBBB, he had Medtronic CRT-D device placed.  He was back in atrial fibrillation in 6/16 and had TEE-guided DCCV again with EF now 15-20% on TEE.  Repeat echo in 2/17 showed EF 55-60%. Echo in 1/19 showed EF 45-50%, mild LV and RV dilation.   Echo in 1/20 showed EF 45-50%, diffuse hypokinesis.   Patient was doing well until about 2 wks ago.  At that time, he started feeling bad.  Weight has gone up about 8 lbs.  He has noted a higher heart rate over that period and is noted to be in atrial fibrillation today.  He is short of breath after walking about 50 yards.  He is short of breath walking up an incline or walking his dog.  No chest pain.  No orthopnea/PND.  Using CPAP.  He has not missed any Xarelto doses.  Yesterday, I had him increase Lasix to 40 mg po bid.   REDS clip 50%   Medtronic device interrogation: He has been in atrial fibrillation since 10/08/19 with decreased thoracic impedance.   ECG: Atrial fibrillation with BiV pacing (personally reviewed)  Labs (6/16): LDL 153 Labs (7/16): HCT 40, LFTs normal, LFTs normal Labs (9/16): TSH normal, K 4.3, creatinine 1.03, BNP 419 Labs (10/17): K 4.2, creatinine 1.29, LFTs normal Labs (11/17): hgb 8.9 Labs (6/19): BNP 625 Labs (10/19): K 4.7, creatinine 1.66 => 1.58, LFTs normal Labs (1/20): LDL  128 Labs (11/20): K 3.6, creatinine 1.62  PMH: 1. Atrial fibrillation: Paroxysmal.  Diagnosed 4/14, DCCV 4/14 to NSR.  DCCV 1/16 to NSR.  DCCV 6/16 to NSR.  2. Chronic systolic CHF: Nonischemic cardiomyopathy.  LHC in 1998 with nonobstructive disease.  Cardiolite in 6/14 with no ischemia or infarction.  cMRI (5/16) with EF 34%, mildly dilated LV with diffuse HK worse in anterolateral wall, small punctate areas of LGE in anteroseptum and basal inferior wall (not CAD pattern).  TEE (6/16) with EF 15-20%.  He has Medtronic CRT-D device.  Echo (9/16) with EF 40%, moderate LV dilation, grade II diastolic dysfunction, normal RV size and systolic function, PASP 42 mmHg.  - Hypotension with Entresto.  - Echo (2/17) showed LV functional recovery, EF 55-60% with mild MR.    - Echo (1/19): EF 45-50%, mild LV dilation, mild RV dilation - Echo (1/20): EF 45-50%, diffuse hypokinesis, moderate diastolic dysfunction, normal RV size and systolic function.  3. Hyperlipidemia: Myalgias with atorvastatin, Livalo, and pravastatin.  4. CKD 5. OA: s/p THR.  6. H/o melanoma 7. Anemia 8. Diverticulosis 9. OSA: Uses CPAP.  10. Pulmonary nodules: Followed by Dr. Delton Coombes.  11. Pancreatic pseudocysts 12. PAD: ABIs (2/20) were 0.94 on right and 1.03 on left.   SH: Married with 3 children, lives in [place], retired, quit smoking in  25.    FH: Mother with MI  ROS: All systems reviewed and negative except as per HPI.  Current Outpatient Medications  Medication Sig Dispense Refill  . amiodarone (PACERONE) 200 MG tablet Take 1 tablet (200 mg total) by mouth twice a day for one week. After 1 week take 1 tablet (200mg  total) daily. (Patient taking differently: Take 200 mg by mouth 2 (two) times daily. ) 97 tablet 3  . carvedilol (COREG) 12.5 MG tablet Take 1 tablet (12.5 mg total) by mouth 2 (two) times daily with a meal. 180 tablet 0  . dorzolamide-timolol (COSOPT) 22.3-6.8 MG/ML ophthalmic solution Place 1  drop into the right eye 2 (two) times daily.    [name] escitalopram (LEXAPRO) 20 MG tablet Take 1 tablet (20 mg total) by mouth daily. 90 tablet 0  . ezetimibe (ZETIA) 10 MG tablet Take 1 tablet (10 mg total) by mouth daily. (Patient taking differently: Take 10 mg by mouth at bedtime. ) 90 tablet 2  . ferrous sulfate 325 (65 FE) MG tablet Take 325 mg by mouth daily with breakfast.     . finasteride (PROSCAR) 5 MG tablet Take 1 tablet (5 mg total) by mouth daily. 30 tablet 2  . furosemide (LASIX) 40 MG tablet Take 40 mg by mouth 2 (two) times daily.    [name] gabapentin (NEURONTIN) 100 MG capsule Take 200 mg by mouth 2 (two) times daily.     [name] latanoprost (XALATAN) 0.005 % ophthalmic solution Place 1 drop into the right eye at bedtime.     [name] losartan (COZAAR) 50 MG tablet Take 50 mg by mouth 2 (two) times daily.    Mckinley Jewel Dimesylate (RHOPRESSA) 0.02 % SOLN Place 1 drop into the right eye at bedtime.     . niacin (NIASPAN) 1000 MG CR tablet Take 1,000 mg by mouth at bedtime.     . nitroGLYCERIN (NITROSTAT) 0.4 MG SL tablet Place 0.4 mg under the tongue every 5 (five) minutes as needed for chest pain.     . potassium chloride (KLOR-CON) 10 MEQ tablet Take 2 tablets (20 mEq total) by mouth daily. (Patient taking differently: Take 10 mEq by mouth 2 (two) times daily. ) 60 tablet 5  . spironolactone (ALDACTONE) 25 MG tablet TAKE (1/2) TABLET DAILY. (Patient taking differently: Take 12.5 mg by mouth daily. ) 90 tablet 3  . tamsulosin (FLOMAX) 0.4 MG CAPS Take 0.4 mg by mouth at bedtime.     Alveda Reasons 20 MG TABS tablet TAKE 1 TABLET ONCE DAILY WITH SUPPER (Patient taking differently: Take 20 mg by mouth at bedtime. ) 90 tablet 0  . Coenzyme Q10 (CO Q-10) 200 MG CAPS Take 200 mg by mouth daily.     No current facility-administered medications for this encounter.   BP 130/88   Pulse (!) 103   Wt 102.2 kg (225 lb 6.4 oz)   SpO2 97%   BMI 36.38 kg/m  General: NAD Neck: JVP 12 cm, no thyromegaly or thyroid  nodule.  Lungs: Clear to auscultation bilaterally with normal respiratory effort. CV: Nondisplaced PMI.  Heart irregular S1/S2, no S3/S4, no murmur.  1+ edema to knees.  No carotid bruit.  Normal pedal pulses.  Abdomen: Soft, nontender, no hepatosplenomegaly, mild distention.  Skin: Intact without lesions or rashes.  Neurologic: Alert and oriented x 3.  Psych: Normal affect. Extremities: No clubbing or cyanosis.  HEENT: Normal.   Assessment/Plan: 1. Chronic systolic CHF: Suspect nonischemic cardiomyopathy.  EF 15-20% on 6/16 TEE.  Cardiac MRI in 5/16 showed a LGE pattern that was not suggestive of coronary disease (?myocarditis or infiltrative disease).  There may be a component of tachycardia-mediated cardiomyopathy.  However, the cardiac MRI appears to have been done when he was in NSR.  In 9/16, echo showed EF increased to 40%.  Echo in 1/20 showed EF 45-50%.  He has Medtronic CRT-D device.  It appears that he has been back in atrial fibrillation for about 2 wks and that this has triggered a CHF exacerbation.  He is volume overloaded by Optivol, exam, and REDs clip.  NYHA class III symptoms. .   - Increase Lasix to 80 qam/40 qpm x 3 days then Lasix 40 mg bid.  BMET today and again in 2 wks.   - Add KCl 20 daily.  - Continue current Coreg and spironolactone.  - Continue losartan 50 mg bid.  - I will obtain repeat echo.   2. Atrial fibrillation: Seems to tolerate poorly.  Back in atrial fibrillation for about 3 wks despite amiodarone, atrial fibrillation has likely triggered CHF exacerbation.  - Continue Xarelto 20 mg daily, CBC today.   - He has not missed any Xarelto doses, I will arrange for DCCV to get him back into NSR.  We discussed risks/benefits and he agrees to procedure.  - Increase amiodarone to 200 mg bid until DCCV. Check LFTs and TSH.  He should have regular eye exams.   - I would like him to see Dr. Rayann Heman after DCCV to assess for AF ablation candidacy given breakthrough atrial  fibrillation with amiodarone.  3. Hyperlipidemia: He is only on Zetia, cannot tolerate statins.  Check lipids today, would like to see LDL < 70 with PAD.  If LDL is too high, will refer to lipid clinic for [place].    4. OSA: Continue CPAP.   5. PAD: Minimal claudication.     Followup in 2 wks.     Loralie Champagne 10/27/2019

## 2019-10-28 ENCOUNTER — Telehealth: Payer: Self-pay

## 2019-10-29 ENCOUNTER — Encounter: Payer: Self-pay | Admitting: Internal Medicine

## 2019-10-29 ENCOUNTER — Other Ambulatory Visit: Payer: Self-pay

## 2019-10-29 ENCOUNTER — Telehealth (INDEPENDENT_AMBULATORY_CARE_PROVIDER_SITE_OTHER): Payer: Medicare PPO | Admitting: Internal Medicine

## 2019-10-29 ENCOUNTER — Telehealth: Payer: Self-pay

## 2019-10-29 VITALS — BP 112/79 | HR 81 | Ht 66.0 in | Wt 217.6 lb

## 2019-10-29 DIAGNOSIS — I5023 Acute on chronic systolic (congestive) heart failure: Secondary | ICD-10-CM | POA: Diagnosis not present

## 2019-10-29 DIAGNOSIS — I4819 Other persistent atrial fibrillation: Secondary | ICD-10-CM

## 2019-10-29 DIAGNOSIS — D6869 Other thrombophilia: Secondary | ICD-10-CM

## 2019-10-29 NOTE — H&P (View-Only) (Signed)
Electrophysiology TeleHealth Note  Due to national recommendations of social distancing due to [name], an audio telehealth visit is felt to be most appropriate for this patient at this time.  Verbal consent was obtained by me for the telehealth visit today.  The patient does not have capability for a virtual visit.  A phone visit is therefore required today.   Date:  10/29/2019   ID:  Chad Brown, DOB Jun 13, 1941, MRN [id number]  Location: patient's home  Provider location:  [place]  Evaluation Performed: Follow-up visit  PCP:  Tisovec, Fransico Him, MD   Electrophysiologist:  Dr Rayann Heman  Chief Complaint:  palpitations  History of Present Illness:    Chad Brown is a 79 y.o. male who presents via telehealth conferencing today.  Since last being seen in our clinic, the patient reports doing reasonably well.  Over the past 2 weeks, his afib has returned.  He has developed worsening CHF.  He reports symptoms of SOB and fatigue.  He was evaluated by Dr Aundra Dubin and his lasix increased.  He is planned for cardioversion tomorrow.  Today, he denies symptoms of palpitations, chest pain, lower extremity edema, dizziness, presyncope, or syncope.  The patient is otherwise without complaint today.  The patient denies symptoms of fevers, chills, cough, or new SOB worrisome for COVID Brown.  Past Medical History:  Diagnosis Date  . Adenomatous colon polyp    tubular  . AICD (automatic cardioverter/defibrillator) present 03/08/2015   MDT CRTD   . Anemia    iron deficient  . Arthritis    "about all my joints; hands, knees, back" (03/08/2015)  . Atherosclerosis   . Cataract    left eye small  . Cholelithiasis    gallstones  . Chronic systolic CHF (congestive heart failure) ([place])    a. New dx 12/2012 ? NICM, may be r/t afib. b. Nuc 03/2013 - normal;  c. 03/2015 TEE EF 15-20%.  . Colon polyp, hyperplastic 5/16   removed precancerous lesions  . Depression   . Diverticulosis   .  Glaucoma    right eye  . Hyperlipidemia   . Hypertension   . Melanoma of eye ([place]) 2000's   "right; it's never been biopsied"  . Melanoma of lower leg ([place]) 2015   "left; right at my knee"  . Myocardial infarction ([place]) 1998  . OSA (obstructive sleep apnea) 01/04/2016   uses cpap, pt does not know settings auto set  . Peripheral vision loss, right 2006  . Persistent atrial fibrillation ([place])    a. Dx 12/2012, s/p TEE/DCCV 01/26/13. b. On Xarelto (CHA2DS2VASc = 3);  c. 03/2015 TEE (EF 15-20%, no LAA thrombus) and DCCV - amio increased to 200 mg bid.  . Urinary hesitancy due to benign prostatic hypertrophy     Past Surgical History:  Procedure Laterality Date  . BACK SURGERY     upper back, cannot turn neck well  . CARDIAC CATHETERIZATION  1998  . CARDIOVERSION N/A 01/26/2013   Procedure: CARDIOVERSION;  Surgeon: Lelon Perla, MD;  Location: [facility] ENDOSCOPY;  Service: Cardiovascular;  Laterality: N/A;  . CARDIOVERSION N/A 03/23/2015   Procedure: CARDIOVERSION;  Surgeon: Jerline Pain, MD;  Location: [facility] ENDOSCOPY;  Service: Cardiovascular;  Laterality: N/A;  . CARDIOVERSION N/A 08/14/2017   Procedure: CARDIOVERSION;  Surgeon: Josue Hector, MD;  Location: [facility];  Service: Cardiovascular;  Laterality: N/A;  . CATARACT EXTRACTION Right ~ 2006  . COLONOSCOPY WITH PROPOFOL N/A 02/10/2015  Procedure: COLONOSCOPY WITH PROPOFOL;  Surgeon: Gatha Mayer, MD;  Location: [facility] ENDOSCOPY;  Service: Endoscopy;  Laterality: N/A;  . COLONOSCOPY WITH PROPOFOL N/A 08/07/2016   Procedure: COLONOSCOPY WITH PROPOFOL;  Surgeon: Gatha Mayer, MD;  Location: [facility] ENDOSCOPY;  Service: Endoscopy;  Laterality: N/A;  . ENTEROSCOPY N/A 08/17/2015   Procedure: ENTEROSCOPY;  Surgeon: Gatha Mayer, MD;  Location: [facility] ENDOSCOPY;  Service: Endoscopy;  Laterality: N/A;  . EP IMPLANTABLE DEVICE N/A 03/08/2015   MDT Hillery Aldo CRT-D for nonischemic CM by Dr Rayann Heman for primary prevention  . GLAUCOMA SURGERY Right ~ 2006    "put 3 stents in to drain fluid" (03/08/2015) not successful, sent to [facility] to try to get last stent out  . HOT HEMOSTASIS N/A 08/07/2016   Procedure: HOT HEMOSTASIS (ARGON PLASMA COAGULATION/BICAP);  Surgeon: Gatha Mayer, MD;  Location: [facility] ENDOSCOPY;  Service: Endoscopy;  Laterality: N/A;  . INCISION AND DRAINAGE ABSCESS POSTERIOR CERVICALSPINE  05/2012  . JOINT REPLACEMENT    . MELANOMA EXCISION Left 2015   "lower leg; right at my knee"  . REFRACTIVE SURGERY Right ~ 2006 X 2   "twice; both done at [facility]" (03/08/2015  . SURGERY SCROTAL / TESTICULAR Right 1990's  . TEE WITHOUT CARDIOVERSION N/A 01/26/2013   Procedure: TRANSESOPHAGEAL ECHOCARDIOGRAM (TEE);  Surgeon: Lelon Perla, MD;  Location: [facility];  Service: Cardiovascular;  Laterality: N/A;  [name] anes. /   . TEE WITHOUT CARDIOVERSION N/A 10/05/2014   Procedure: TRANSESOPHAGEAL ECHOCARDIOGRAM (TEE)  with cardioversion;  Surgeon: Thayer Headings, MD;  Location: [facility] ENDOSCOPY;  Service: Cardiovascular;  Laterality: N/A;  12:52 synched cardioversion at 120 joules,...afib to SR...12 lead EKG ordered.Marland KitchenMarland KitchenCardiozem d/c'ed per MD verbal order at SR  . TEE WITHOUT CARDIOVERSION N/A 03/23/2015   Procedure: TRANSESOPHAGEAL ECHOCARDIOGRAM (TEE);  Surgeon: Jerline Pain, MD;  Location: [facility] ENDOSCOPY;  Service: Cardiovascular;  Laterality: N/A;  . THORACIC SPINE SURGERY  03/2000   "ground calcium deposits from upper thoracic" (01/26/2013)  . TOTAL HIP ARTHROPLASTY Right 06/2007    Current Outpatient Medications  Medication Sig Dispense Refill  . amiodarone (PACERONE) 200 MG tablet Take 1 tablet (200 mg total) by mouth twice a day for one week. After 1 week take 1 tablet (200mg  total) daily. 97 tablet 3  . carvedilol (COREG) 12.5 MG tablet Take 1 tablet (12.5 mg total) by mouth 2 (two) times daily with a meal. 180 tablet 0  . Coenzyme Q10 (CO Q-10) 200 MG CAPS Take 200 mg by mouth daily.    . dorzolamide-timolol (COSOPT) 22.3-6.8 MG/ML ophthalmic  solution Place 1 drop into the right eye 2 (two) times daily.    [name] escitalopram (LEXAPRO) 20 MG tablet Take 1 tablet (20 mg total) by mouth daily. 90 tablet 0  . ezetimibe (ZETIA) 10 MG tablet Take 1 tablet (10 mg total) by mouth daily. 90 tablet 2  . ferrous sulfate 325 (65 FE) MG tablet Take 325 mg by mouth daily with breakfast.     . finasteride (PROSCAR) 5 MG tablet Take 1 tablet (5 mg total) by mouth daily. 30 tablet 2  . furosemide (LASIX) 40 MG tablet Take 40 mg by mouth 2 (two) times daily.    [name] gabapentin (NEURONTIN) 100 MG capsule Take 200 mg by mouth 2 (two) times daily.     [name] latanoprost (XALATAN) 0.005 % ophthalmic solution Place 1 drop into the right eye at bedtime.     [name] losartan (COZAAR) 50 MG tablet Take 50 mg  by mouth 2 (two) times daily.    Mckinley Jewel Dimesylate (RHOPRESSA) 0.02 % SOLN Place 1 drop into the right eye at bedtime.     . niacin (NIASPAN) 1000 MG CR tablet Take 1,000 mg by mouth at bedtime.     . nitroGLYCERIN (NITROSTAT) 0.4 MG SL tablet Place 0.4 mg under the tongue every 5 (five) minutes as needed for chest pain.     . potassium chloride (KLOR-CON) 10 MEQ tablet Take 2 tablets (20 mEq total) by mouth daily. 60 tablet 5  . spironolactone (ALDACTONE) 25 MG tablet TAKE (1/2) TABLET DAILY. 90 tablet 3  . tamsulosin (FLOMAX) 0.4 MG CAPS Take 0.4 mg by mouth at bedtime.     Alveda Reasons 20 MG TABS tablet TAKE 1 TABLET ONCE DAILY WITH SUPPER 90 tablet 0   No current facility-administered medications for this visit.    Allergies:   Pravastatin sodium and Azithromycin   Social History:  The patient  reports that he quit smoking about Brown years ago. His smoking use included cigarettes. He has a 144.00 pack-year smoking history. He has never used smokeless tobacco. He reports that he does not drink alcohol or use drugs.   Family History:  The patient's family history includes Colon cancer in his paternal uncle; Diabetes in his maternal aunt, maternal grandmother,  maternal uncle, and mother; Heart attack in his maternal uncle; Heart disease in his mother; Kidney disease in his mother; Leukemia in his father; Lung cancer in his paternal uncle; Prostate cancer in his paternal uncle.   ROS:  Please see the history of present illness.   All other systems are personally reviewed and negative.    Exam:    Vital Signs:  BP 112/79   Pulse 81   Ht 5\' 6"  (1.676 m)   Wt 217 lb 9.6 oz (98.7 kg)   BMI 35.12 kg/m   Well sounding, alert and conversant   Labs/Other Tests and Data Reviewed:    Recent Labs: 10/27/2019: ALT 15; B Natriuretic Peptide 578.2; BUN 18; Creatinine, Ser 1.89; Hemoglobin 9.8; Platelets 121; Potassium 3.8; Sodium 138; TSH 3.441   Wt Readings from Last 3 Encounters:  10/29/19 217 lb 9.6 oz (98.7 kg)  10/27/19 225 lb 6.4 oz (102.2 kg)  10/09/19 213 lb 6.4 oz (96.8 kg)     Last device remote is reviewed from [place] PDF which reveals normal device function    ASSESSMENT & PLAN:    1.  Persistent atrial fibrillation The patient has symptomatic, recurrent persistent atrial fibrillation. he has failed medical therapy with amiodarone. Chads2vasc score is 4.  he is anticoagulated with xarelto. Therapeutic strategies for afib including medicine and ablation were discussed in detail with the patient today. Risk, benefits, and alternatives to EP study and radiofrequency ablation for afib were also discussed in detail today. These risks include but are not limited to stroke, bleeding, vascular damage, tamponade, perforation, damage to the esophagus, lungs, and other structures, pulmonary vein stenosis, worsening renal function, and death. The patient understands these risk and wishes to proceed.  We will therefore proceed with catheter ablation at the next available time.  Carto, ICE, anesthesia are requested for the procedure.  Will also obtain TEE prior to the procedure to exclude LAA thrombus and further evaluate atrial anatomy.  2. Acute on  chronic systolic dysfunction Lasix increased I think that he will do much better with sinus rhythm We will proceed with ablation  3. CRI (stage III) Will obtain TEE rather  than CT to evaluate his LAA.  4. OSA Compliant with CPAP   Patient Risk:  after full review of this patients clinical status, I feel that they are at high risk at this time.  Today, I have spent 20 minutes with the patient with telehealth technology discussing arrhythmia management .    Army Fossa, MD  10/29/2019 11:39 AM     [facility] [address] [place] [address] [phone] (office) [phone] (fax)

## 2019-10-29 NOTE — H&P (View-Only) (Signed)
Electrophysiology TeleHealth Note  Due to national recommendations of social distancing due to [place] 19, an audio telehealth visit is felt to be most appropriate for this patient at this time.  Verbal consent was obtained by me for the telehealth visit today.  The patient does not have capability for a virtual visit.  A phone visit is therefore required today.   Date:  10/29/2019   ID:  Chad Brown, DOB [birth year], MRN [id number]  Location: patient's home  Provider location:  [place]  Evaluation Performed: Follow-up visit  PCP:  Tisovec, Fransico Him, MD   Electrophysiologist:  Dr Rayann Heman  Chief Complaint:  palpitations  History of Present Illness:    Chad Brown is a 79 y.o. male who presents via telehealth conferencing today.  Since last being seen in our clinic, the patient reports doing reasonably well.  Over the past 2 weeks, his afib has returned.  He has developed worsening CHF.  He reports symptoms of SOB and fatigue.  He was evaluated by Dr Aundra Dubin and his lasix increased.  He is planned for cardioversion tomorrow.  Today, he denies symptoms of palpitations, chest pain, lower extremity edema, dizziness, presyncope, or syncope.  The patient is otherwise without complaint today.  The patient denies symptoms of fevers, chills, cough, or new SOB worrisome for COVID 19.  Past Medical History:  Diagnosis Date  . Adenomatous colon polyp    tubular  . AICD (automatic cardioverter/defibrillator) present 03/08/2015   MDT CRTD   . Anemia    iron deficient  . Arthritis    "about all my joints; hands, knees, back" (03/08/2015)  . Atherosclerosis   . Cataract    left eye small  . Cholelithiasis    gallstones  . Chronic systolic CHF (congestive heart failure) ([place])    a. New dx 12/2012 ? NICM, may be r/t afib. b. Nuc 03/2013 - normal;  c. 03/2015 TEE EF 15-20%.  . Colon polyp, hyperplastic 5/16   removed precancerous lesions  . Depression   . Diverticulosis   .  Glaucoma    right eye  . Hyperlipidemia   . Hypertension   . Melanoma of eye ([place]) 2000's   "right; it's never been biopsied"  . Melanoma of lower leg ([place]) 2015   "left; right at my knee"  . Myocardial infarction ([place]) 1998  . OSA (obstructive sleep apnea) 01/04/2016   uses cpap, pt does not know settings auto set  . Peripheral vision loss, right 2006  . Persistent atrial fibrillation ([place])    a. Dx 12/2012, s/p TEE/DCCV 01/26/13. b. On Xarelto (CHA2DS2VASc = 3);  c. 03/2015 TEE (EF 15-20%, no LAA thrombus) and DCCV - amio increased to 200 mg bid.  . Urinary hesitancy due to benign prostatic hypertrophy     Past Surgical History:  Procedure Laterality Date  . BACK SURGERY     upper back, cannot turn neck well  . CARDIAC CATHETERIZATION  1998  . CARDIOVERSION N/A 01/26/2013   Procedure: CARDIOVERSION;  Surgeon: Lelon Perla, MD;  Location[facility] ENDOSCOPY;  Service: Cardiovascular;  Laterality: N/A;  . CARDIOVERSION N/A 03/23/2015   Procedure: CARDIOVERSION;  Surgeon: Jerline Pain, MD;  Location: [facility] ENDOSCOPY;  Service: Cardiovascular;  Laterality: N/A;  . CARDIOVERSION N/A 08/14/2017   Procedure: CARDIOVERSION;  Surgeon: Josue Hector, MD;  Location: [place];  Service: Cardiovascular;  Laterality: N/A;  . CATARACT EXTRACTION Right ~ 2006  . COLONOSCOPY WITH PROPOFOL N/A 02/10/2015  Procedure: COLONOSCOPY WITH PROPOFOL;  Surgeon: Gatha Mayer, MD;  Location: [facility] ENDOSCOPY;  Service: Endoscopy;  Laterality: N/A;  . COLONOSCOPY WITH PROPOFOL N/A 08/07/2016   Procedure: COLONOSCOPY WITH PROPOFOL;  Surgeon: Gatha Mayer, MD;  Location: [facility] ENDOSCOPY;  Service: Endoscopy;  Laterality: N/A;  . ENTEROSCOPY N/A 08/17/2015   Procedure: ENTEROSCOPY;  Surgeon: Gatha Mayer, MD;  Location: [facility] ENDOSCOPY;  Service: Endoscopy;  Laterality: N/A;  . EP IMPLANTABLE DEVICE N/A 03/08/2015   MDT Hillery Aldo CRT-D for nonischemic CM by Dr Rayann Heman for primary prevention  . GLAUCOMA SURGERY Right ~ 2006    "put 3 stents in to drain fluid" (03/08/2015) not successful, [facility] to [facility] to get [facility] stent out  . HOT HEMOSTASIS N/A 08/07/2016   Procedure: HOT HEMOSTASIS (ARGON PLASMA COAGULATION/BICAP);  Surgeon: Gatha Mayer, MD;  Location: [facility] ENDOS[facility];  Service: Endoscopy;  Laterality: N/A;  . INCISION AND DRAINAGE ABSCESS POSTERIOR CERVICALSPINE  05/2012  . JOINT REPLACEMENT    . MELANOMA EXCISION Left 2015   "lower leg; right at my knee"  . REFRACTIVE SURGERY Right ~ 2006 X 2   "twice; both done at [place]" (03/08/2015  . SURGERY SCROTAL / TESTICULAR Right 1990's  . TEE WITHOUT CARDIOVERSION N/A 01/26/2013   Procedure: TRANSESOPHAGEAL ECHOCARDIOGRAM (TEE);  Surgeon: Lelon Perla, MD;  Location: [place];  Service: Cardiovascular;  Laterality: N/A;  [name] anes. /   . TEE WITHOUT CARDIOVERSION N/A 10/05/2014   Procedure: TRANSESOPHAGEAL ECHOCARDIOGRAM (TEE)  with cardioversion;  Surgeon: Thayer Headings, MD;  Location[facility] ENDOSCOPY;  Service: Cardiovascular;  Laterality: N/A;  12:52 synched cardioversion at 120 joules,...afib to SR...12 lead EKG ordered.Marland KitchenMarland KitchenCardiozem d/c'ed per MD verbal order at SR  . TEE WITHOUT CARDIOVERSION N/A 03/23/2015   Procedure: TRANSESOPHAGEAL ECHOCARDIOGRAM (TEE);  Surgeon: Jerline Pain, MD;  Location: [facility] ENDOSCOPY;  Service: Cardiovascular;  Laterality: N/A;  . THORACIC SPINE SURGERY  03/2000   "ground calcium deposits from upper thoracic" (01/26/2013)  . TOTAL HIP ARTHROPLASTY Right 06/2007    Current Outpatient Medications  Medication Sig Dispense Refill  . amiodarone (PACERONE) 200 MG tablet Take 1 tablet (200 mg total) by mouth twice a day for one week. After 1 week take 1 tablet (200mg  total) daily. 97 tablet 3  . carvedilol (COREG) 12.5 MG tablet Take 1 tablet (12.5 mg total) by mouth 2 (two) times daily with a meal. 180 tablet 0  . Coenzyme Q10 (CO Q-10) 200 MG CAPS Take 200 mg by mouth daily.    . dorzolamide-timolol (COSOPT) 22.3-6.8 MG/ML ophthalmic  solution Place 1 drop into the right eye 2 (two) times daily.    [name] escitalopram (LEXAPRO) 20 MG tablet Take 1 tablet (20 mg total) by mouth daily. 90 tablet 0  . ezetimibe (ZETIA) 10 MG tablet Take 1 tablet (10 mg total) by mouth daily. 90 tablet 2  . ferrous sulfate 325 (65 FE) MG tablet Take 325 mg by mouth daily with breakfast.     . finasteride (PROSCAR) 5 MG tablet Take 1 tablet (5 mg total) by mouth daily. 30 tablet 2  . furosemide (LASIX) 40 MG tablet Take 40 mg by mouth 2 (two) times daily.    [name] gabapentin (NEURONTIN) 100 MG capsule Take 200 mg by mouth 2 (two) times daily.     [name] latanoprost (XALATAN) 0.005 % ophthalmic solution Place 1 drop into the right eye at bedtime.     [name] losartan (COZAAR) 50 MG tablet Take 50 mg  by mouth 2 (two) times daily.    Mckinley Jewel Dimesylate (RHOPRESSA) 0.02 % SOLN Place 1 drop into the right eye at bedtime.     . niacin (NIASPAN) 1000 MG CR tablet Take 1,000 mg by mouth at bedtime.     . nitroGLYCERIN (NITROSTAT) 0.4 MG SL tablet Place 0.4 mg under the tongue every 5 (five) minutes as needed for chest pain.     . potassium chloride (KLOR-CON) 10 MEQ tablet Take 2 tablets (20 mEq total) by mouth daily. 60 tablet 5  . spironolactone (ALDACTONE) 25 MG tablet TAKE (1/2) TABLET DAILY. 90 tablet 3  . tamsulosin (FLOMAX) 0.4 MG CAPS Take 0.4 mg by mouth at bedtime.     Alveda Reasons 20 MG TABS tablet TAKE 1 TABLET ONCE DAILY WITH SUPPER 90 tablet 0   No current facility-administered medications for this visit.    Allergies:   Pravastatin sodium and Azithromycin   Social History:  The patient  reports that he quit smoking about 19 years ago. His smoking use included cigarettes. He has a 144.00 pack-year smoking history. He has never used smokeless tobacco. He reports that he does not drink alcohol or use drugs.   Family History:  The patient's family history includes Colon cancer in his paternal uncle; Diabetes in his maternal aunt, maternal grandmother,  maternal uncle, and mother; Heart attack in his maternal uncle; Heart disease in his mother; Kidney disease in his mother; Leukemia in his father; Lung cancer in his paternal uncle; Prostate cancer in his paternal uncle.   ROS:  Please see the history of present illness.   All other systems are personally reviewed and negative.    Exam:    Vital Signs:  BP 112/79   Pulse 81   Ht 5\' 6"  (1.676 m)   Wt 217 lb 9.6 oz (98.7 kg)   BMI 35.12 kg/m   Well sounding, alert and conversant   Labs/Other Tests and Data Reviewed:    Recent Labs: 10/27/2019: ALT 15; B Natriuretic Peptide 578.2; BUN 18; Creatinine, Ser 1.89; Hemoglobin 9.8; Platelets 121; Potassium 3.8; Sodium 138; TSH 3.441   Wt Readings from Last 3 Encounters:  10/29/19 217 lb 9.6 oz (98.7 kg)  10/27/19 225 lb 6.4 oz (102.2 kg)  10/09/19 213 lb 6.4 oz (96.8 kg)     Last device remote is reviewed from [place] PDF which reveals normal device function    ASSESSMENT & PLAN:    1.  Persistent atrial fibrillation The patient has symptomatic, recurrent persistent atrial fibrillation. he has failed medical therapy with amiodarone. Chads2vasc score is 4.  he is anticoagulated with xarelto. Therapeutic strategies for afib including medicine and ablation were discussed in detail with the patient today. Risk, benefits, and alternatives to EP study and radiofrequency ablation for afib were also discussed in detail today. These risks include but are not limited to stroke, bleeding, vascular damage, tamponade, perforation, damage to the esophagus, lungs, and other structures, pulmonary vein stenosis, worsening renal function, and death. The patient understands these risk and wishes to proceed.  We will therefore proceed with catheter ablation at the next available time.  Carto, ICE, anesthesia are requested for the procedure.  Will also obtain TEE prior to the procedure to exclude LAA thrombus and further evaluate atrial anatomy.  2. Acute on  chronic systolic dysfunction Lasix increased I think that he will do much better with sinus rhythm We will proceed with ablation  3. CRI (stage III) Will obtain TEE rather  than CT to evaluate his LAA.  4. OSA Compliant with CPAP   Patient Risk:  after full review of this patients clinical status, I feel that they are at high risk at this time.  Today, I have spent 20 minutes with the patient with telehealth technology discussing arrhythmia management .    Army Fossa, MD  10/29/2019 11:39 AM     [facility] [address] [place] [address] [phone] (office) [phone] (fax)

## 2019-10-29 NOTE — Telephone Encounter (Signed)
-----   Message from Thompson Grayer, MD sent at 10/29/2019 11:42 AM EST ----- Afib ablation C/I/A  TEE rather than CT due to renal failure He is willing to do next week

## 2019-10-29 NOTE — Telephone Encounter (Signed)
Pt is scheduled for TEE on 11/02/2019  Scheduled for afib ablation on 11/03/2019   Work up complete

## 2019-10-29 NOTE — Progress Notes (Signed)
Electrophysiology TeleHealth Note  Due to national recommendations of social distancing due to [place] 19, an audio telehealth visit is felt to be most appropriate for this patient at this time.  Verbal consent was obtained by me for the telehealth visit today.  The patient does not have capability for a virtual visit.  A phone visit is therefore required today.   Date:  10/29/2019   ID:  Chad Brown, DOB 03/02/1941, MRN [id number]  Location: patient's home  Provider location:  [place]  Evaluation Performed: Follow-up visit  PCP:  Tisovec, Fransico Him, MD   Electrophysiologist:  Dr Rayann Heman  Chief Complaint:  palpitations  History of Present Illness:    Chad Brown is a 79 y.o. male who presents via telehealth conferencing today.  Since last being seen in our clinic, the patient reports doing reasonably well.  Over the past 2 weeks, his afib has returned.  He has developed worsening CHF.  He reports symptoms of SOB and fatigue.  He was evaluated by Dr Aundra Dubin and his lasix increased.  He is planned for cardioversion tomorrow.  Today, he denies symptoms of palpitations, chest pain, lower extremity edema, dizziness, presyncope, or syncope.  The patient is otherwise without complaint today.  The patient denies symptoms of fevers, chills, cough, or new SOB worrisome for COVID 19.  Past Medical History:  Diagnosis Date  . Adenomatous colon polyp    tubular  . AICD (automatic cardioverter/defibrillator) present 03/08/2015   MDT CRTD   . Anemia    iron deficient  . Arthritis    "about all my joints; hands, knees, back" (03/08/2015)  . Atherosclerosis   . Cataract    left eye small  . Cholelithiasis    gallstones  . Chronic systolic CHF (congestive heart failure) ([place])    a. New dx 12/2012 ? NICM, may be r/t afib. b. Nuc 03/2013 - normal;  c. 03/2015 TEE EF 15-20%.  . Colon polyp, hyperplastic 5/16   removed precancerous lesions  . Depression   . Diverticulosis   .  Glaucoma    right eye  . Hyperlipidemia   . Hypertension   . Melanoma of eye ([place]) 2000's   "right; it's never been biopsied"  . Melanoma of lower leg ([place]) 2015   "left; right at my knee"  . Myocardial infarction ([place]) 1998  . OSA (obstructive sleep apnea) 01/04/2016   uses cpap, pt does not know settings auto set  . Peripheral vision loss, right 2006  . Persistent atrial fibrillation ([place])    a. Dx 12/2012, s/p TEE/DCCV 01/26/13. b. On Xarelto (CHA2DS2VASc = 3);  c. 03/2015 TEE (EF 15-20%, no LAA thrombus) and DCCV - amio increased to 200 mg bid.  . Urinary hesitancy due to benign prostatic hypertrophy     Past Surgical History:  Procedure Laterality Date  . BACK SURGERY     upper back, cannot turn neck well  . CARDIAC CATHETERIZATION  1998  . CARDIOVERSION N/A 01/26/2013   Procedure: CARDIOVERSION;  Surgeon: Lelon Perla, MD;  Location: [facility] ENDOSCOPY;  Service: Cardiovascular;  Laterality: N/A;  . CARDIOVERSION N/A 03/23/2015   Procedure: CARDIOVERSION;  Surgeon: Jerline Pain, MD;  Location: [facility] ENDOSCOPY;  Service: Cardiovascular;  Laterality: N/A;  . CARDIOVERSION N/A 08/14/2017   Procedure: CARDIOVERSION;  Surgeon: Josue Hector, MD;  Location: [place];  Service: Cardiovascular;  Laterality: N/A;  . CATARACT EXTRACTION Right ~ 2006  . COLONOSCOPY WITH PROPOFOL N/A 02/10/2015  Procedure: COLONOSCOPY WITH PROPOFOL;  Surgeon: Gatha Mayer, MD;  [facility] ENDOSCOPY;  Service: Endoscopy;  Laterality: N/A;  . COLONOSCOPY WITH PROPOFOL N/A 08/07/2016   Procedure: COLONOSCOPY WITH PROPOFOL;  Surgeon: Gatha Mayer, MD;  Location: [facility] ENDOSCOPY;  Service: Endoscopy;  Laterality: N/A;  . ENTEROSCOPY N/A 08/17/2015   Procedure: ENTEROSCOPY;  Surgeon: Gatha Mayer, MD;  Location: [facility] ENDOSCOPY;  Service: Endoscopy;  Laterality: N/A;  . EP IMPLANTABLE DEVICE N/A 03/08/2015   MDT Hillery Aldo CRT-D for nonischemic CM by Dr Rayann Heman for primary prevention  . GLAUCOMA SURGERY Right ~ 2006    "put 3 stents in to drain fluid" (03/08/2015) not successful, [facility] to [facility] to get [facility] stent out  . HOT HEMOSTASIS N/A 08/07/2016   Procedure: HOT HEMOSTASIS (ARGON PLASMA COAGULATION/BICAP);  Surgeon: Gatha Mayer, MD;  Location: [facility] ENDOSCOPY;  Service: Endoscopy;  Laterality: N/A;  . INCISION AND DRAINAGE ABSCESS POSTERIOR CERVICALSPINE  05/2012  . JOINT REPLACEMENT    . MELANOMA EXCISION Left 2015   "lower leg; right at my knee"  . REFRACTIVE SURGERY Right ~ 2006 X 2   "twice; both done at [place]" (03/08/2015  . SURGERY SCROTAL / TESTICULAR Right 1990's  . TEE WITHOUT CARDIOVERSION N/A 01/26/2013   Procedure: TRANSESOPHAGEAL ECHOCARDIOGRAM (TEE);  Surgeon: Lelon Perla, MD;  Location: [place];  Service: Cardiovascular;  Laterality: N/A;  [name] anes. /   . TEE WITHOUT CARDIOVERSION N/A 10/05/2014   Procedure: TRANSESOPHAGEAL ECHOCARDIOGRAM (TEE)  with cardioversion;  Surgeon: Thayer Headings, MD;  Location: [facility] ENDOSCOPY;  Service: Cardiovascular;  Laterality: N/A;  12:52 synched cardioversion at 120 joules,...afib to SR...12 lead EKG ordered.Marland KitchenMarland KitchenCardiozem d/c'ed per MD verbal order at SR  . TEE WITHOUT CARDIOVERSION N/A 03/23/2015   Procedure: TRANSESOPHAGEAL ECHOCARDIOGRAM (TEE);  Surgeon: Jerline Pain, MD;  Location: [facility] ENDOSCOPY;  Service: Cardiovascular;  Laterality: N/A;  . THORACIC SPINE SURGERY  03/2000   "ground calcium deposits from upper thoracic" (01/26/2013)  . TOTAL HIP ARTHROPLASTY Right 06/2007    Current Outpatient Medications  Medication Sig Dispense Refill  . amiodarone (PACERONE) 200 MG tablet Take 1 tablet (200 mg total) by mouth twice a day for one week. After 1 week take 1 tablet (200mg  total) daily. 97 tablet 3  . carvedilol (COREG) 12.5 MG tablet Take 1 tablet (12.5 mg total) by mouth 2 (two) times daily with a meal. 180 tablet 0  . Coenzyme Q10 (CO Q-10) 200 MG CAPS Take 200 mg by mouth daily.    . dorzolamide-timolol (COSOPT) 22.3-6.8 MG/ML ophthalmic  solution Place 1 drop into the right eye 2 (two) times daily.    [name] escitalopram (LEXAPRO) 20 MG tablet Take 1 tablet (20 mg total) by mouth daily. 90 tablet 0  . ezetimibe (ZETIA) 10 MG tablet Take 1 tablet (10 mg total) by mouth daily. 90 tablet 2  . ferrous sulfate 325 (65 FE) MG tablet Take 325 mg by mouth daily with breakfast.     . finasteride (PROSCAR) 5 MG tablet Take 1 tablet (5 mg total) by mouth daily. 30 tablet 2  . furosemide (LASIX) 40 MG tablet Take 40 mg by mouth 2 (two) times daily.    [name] gabapentin (NEURONTIN) 100 MG capsule Take 200 mg by mouth 2 (two) times daily.     [name] latanoprost (XALATAN) 0.005 % ophthalmic solution Place 1 drop into the right eye at bedtime.     [name] losartan (COZAAR) 50 MG tablet Take 50 mg  by mouth 2 (two) times daily.    Mckinley Jewel Dimesylate (RHOPRESSA) 0.02 % SOLN Place 1 drop into the right eye at bedtime.     . niacin (NIASPAN) 1000 MG CR tablet Take 1,000 mg by mouth at bedtime.     . nitroGLYCERIN (NITROSTAT) 0.4 MG SL tablet Place 0.4 mg under the tongue every 5 (five) minutes as needed for chest pain.     . potassium chloride (KLOR-CON) 10 MEQ tablet Take 2 tablets (20 mEq total) by mouth daily. 60 tablet 5  . spironolactone (ALDACTONE) 25 MG tablet TAKE (1/2) TABLET DAILY. 90 tablet 3  . tamsulosin (FLOMAX) 0.4 MG CAPS Take 0.4 mg by mouth at bedtime.     Alveda Reasons 20 MG TABS tablet TAKE 1 TABLET ONCE DAILY WITH SUPPER 90 tablet 0   No current facility-administered medications for this visit.    Allergies:   Pravastatin sodium and Azithromycin   Social History:  The patient  reports that he quit smoking about 19 years ago. His smoking use included cigarettes. He has a 144.00 pack-year smoking history. He has never used smokeless tobacco. He reports that he does not drink alcohol or use drugs.   Family History:  The patient's family history includes Colon cancer in his paternal uncle; Diabetes in his maternal aunt, maternal grandmother,  maternal uncle, and mother; Heart attack in his maternal uncle; Heart disease in his mother; Kidney disease in his mother; Leukemia in his father; Lung cancer in his paternal uncle; Prostate cancer in his paternal uncle.   ROS:  Please see the history of present illness.   All other systems are personally reviewed and negative.    Exam:    Vital Signs:  BP 112/79   Pulse 81   Ht 5\' 6"  (1.676 m)   Wt 217 lb 9.6 oz (98.7 kg)   BMI 35.12 kg/m   Well sounding, alert and conversant   Labs/Other Tests and Data Reviewed:    Recent Labs: 10/27/2019: ALT 15; B Natriuretic Peptide 578.2; BUN 18; Creatinine, Ser 1.89; Hemoglobin 9.8; Platelets 121; Potassium 3.8; Sodium 138; TSH 3.441   Wt Readings from Last 3 Encounters:  10/29/19 217 lb 9.6 oz (98.7 kg)  10/27/19 225 lb 6.4 oz (102.2 kg)  10/09/19 213 lb 6.4 oz (96.8 kg)     Last device remote is reviewed from [place] PDF which reveals normal device function    ASSESSMENT & PLAN:    1.  Persistent atrial fibrillation The patient has symptomatic, recurrent persistent atrial fibrillation. he has failed medical therapy with amiodarone. Chads2vasc score is 4.  he is anticoagulated with xarelto. Therapeutic strategies for afib including medicine and ablation were discussed in detail with the patient today. Risk, benefits, and alternatives to EP study and radiofrequency ablation for afib were also discussed in detail today. These risks include but are not limited to stroke, bleeding, vascular damage, tamponade, perforation, damage to the esophagus, lungs, and other structures, pulmonary vein stenosis, worsening renal function, and death. The patient understands these risk and wishes to proceed.  We will therefore proceed with catheter ablation at the next available time.  Carto, ICE, anesthesia are requested for the procedure.  Will also obtain TEE prior to the procedure to exclude LAA thrombus and further evaluate atrial anatomy.  2. Acute on  chronic systolic dysfunction Lasix increased I think that he will do much better with sinus rhythm We will proceed with ablation  3. CRI (stage III) Will obtain TEE rather  than CT to evaluate his LAA.  4. OSA Compliant with CPAP   Patient Risk:  after full review of this patients clinical status, I feel that they are at high risk at this time.  Today, I have spent 20 minutes with the patient with telehealth technology discussing arrhythmia management .    Army Fossa, MD  10/29/2019 11:39 AM     [facility] [address] [place] [address] [phone] (office) [phone] (fax)

## 2019-10-30 ENCOUNTER — Ambulatory Visit (HOSPITAL_COMMUNITY): Admission: RE | Admit: 2019-10-30 | Payer: Medicare PPO | Source: Home / Self Care | Admitting: Cardiology

## 2019-10-30 ENCOUNTER — Encounter (HOSPITAL_COMMUNITY): Admission: RE | Payer: Self-pay | Source: Home / Self Care

## 2019-10-30 ENCOUNTER — Telehealth: Payer: Self-pay

## 2019-10-30 SURGERY — CARDIOVERSION
Anesthesia: General

## 2019-10-30 NOTE — Telephone Encounter (Signed)
Pt tested positive for covid on 08/24/20.  Pt doesn't not need to be retested prior to upcoming procedures per [facility] policy.

## 2019-11-02 ENCOUNTER — Telehealth: Payer: Medicare PPO | Admitting: Internal Medicine

## 2019-11-02 ENCOUNTER — Encounter (HOSPITAL_COMMUNITY): Payer: Self-pay | Admitting: Cardiovascular Disease

## 2019-11-02 ENCOUNTER — Encounter (HOSPITAL_COMMUNITY)
Admission: RE | Disposition: A | Discharge status: Home / Self Care | Payer: Self-pay | Source: Home / Self Care | Attending: Cardiovascular Disease

## 2019-11-02 ENCOUNTER — Ambulatory Visit (HOSPITAL_COMMUNITY)
Admission: RE | Admit: 2019-11-02 | Discharge: 2019-11-02 | Disposition: A | Discharge status: Home / Self Care | Payer: Medicare PPO | Attending: Cardiovascular Disease | Admitting: Cardiovascular Disease

## 2019-11-02 ENCOUNTER — Other Ambulatory Visit: Payer: Self-pay

## 2019-11-02 ENCOUNTER — Ambulatory Visit (HOSPITAL_BASED_OUTPATIENT_CLINIC_OR_DEPARTMENT_OTHER): Payer: Medicare PPO

## 2019-11-02 DIAGNOSIS — F329 Major depressive disorder, single episode, unspecified: Secondary | ICD-10-CM | POA: Insufficient documentation

## 2019-11-02 DIAGNOSIS — Z79899 Other long term (current) drug therapy: Secondary | ICD-10-CM | POA: Insufficient documentation

## 2019-11-02 DIAGNOSIS — Z87891 Personal history of nicotine dependence: Secondary | ICD-10-CM | POA: Diagnosis not present

## 2019-11-02 DIAGNOSIS — I252 Old myocardial infarction: Secondary | ICD-10-CM | POA: Diagnosis not present

## 2019-11-02 DIAGNOSIS — Z7901 Long term (current) use of anticoagulants: Secondary | ICD-10-CM | POA: Insufficient documentation

## 2019-11-02 DIAGNOSIS — I340 Nonrheumatic mitral (valve) insufficiency: Secondary | ICD-10-CM | POA: Diagnosis not present

## 2019-11-02 DIAGNOSIS — Z95810 Presence of automatic (implantable) cardiac defibrillator: Secondary | ICD-10-CM | POA: Diagnosis not present

## 2019-11-02 DIAGNOSIS — I5023 Acute on chronic systolic (congestive) heart failure: Secondary | ICD-10-CM | POA: Insufficient documentation

## 2019-11-02 DIAGNOSIS — N183 Chronic kidney disease, stage 3 unspecified: Secondary | ICD-10-CM | POA: Insufficient documentation

## 2019-11-02 DIAGNOSIS — I130 Hypertensive heart and chronic kidney disease with heart failure and stage 1 through stage 4 chronic kidney disease, or unspecified chronic kidney disease: Secondary | ICD-10-CM | POA: Insufficient documentation

## 2019-11-02 DIAGNOSIS — E785 Hyperlipidemia, unspecified: Secondary | ICD-10-CM | POA: Insufficient documentation

## 2019-11-02 DIAGNOSIS — G4733 Obstructive sleep apnea (adult) (pediatric): Secondary | ICD-10-CM | POA: Insufficient documentation

## 2019-11-02 DIAGNOSIS — Z881 Allergy status to other antibiotic agents status: Secondary | ICD-10-CM | POA: Insufficient documentation

## 2019-11-02 DIAGNOSIS — I4819 Other persistent atrial fibrillation: Secondary | ICD-10-CM | POA: Diagnosis not present

## 2019-11-02 DIAGNOSIS — I4891 Unspecified atrial fibrillation: Secondary | ICD-10-CM

## 2019-11-02 HISTORY — PX: TEE WITHOUT CARDIOVERSION: SHX5443

## 2019-11-02 SURGERY — ECHOCARDIOGRAM, TRANSESOPHAGEAL
Anesthesia: Moderate Sedation

## 2019-11-02 MED ORDER — MIDAZOLAM HCL (PF) 10 MG/2ML IJ SOLN
INTRAMUSCULAR | Status: DC | PRN
  Administered 2019-11-02 (×2): 2 mg via INTRAVENOUS

## 2019-11-02 MED ORDER — FENTANYL CITRATE (PF) 100 MCG/2ML IJ SOLN
INTRAMUSCULAR | Status: DC | PRN
  Administered 2019-11-02 (×2): 25 ug via INTRAVENOUS

## 2019-11-02 MED ORDER — FENTANYL CITRATE (PF) 100 MCG/2ML IJ SOLN
INTRAMUSCULAR | Status: AC
  Filled 2019-11-02: qty 4

## 2019-11-02 MED ORDER — BUTAMBEN-TETRACAINE-BENZOCAINE 2-2-14 % EX AERO
INHALATION_SPRAY | CUTANEOUS | Status: DC | PRN
  Administered 2019-11-02: 08:00:00 2 via TOPICAL

## 2019-11-02 MED ORDER — SODIUM CHLORIDE 0.9 % IV SOLN: INTRAVENOUS | Status: DC

## 2019-11-02 MED ORDER — MIDAZOLAM HCL (PF) 5 MG/ML IJ SOLN
INTRAMUSCULAR | Status: AC
  Filled 2019-11-02: qty 2

## 2019-11-02 NOTE — Interval H&P Note (Signed)
History and Physical Interval Note:  11/02/2019 7:15 AM  Chad Brown  has presented today for surgery, with the diagnosis of Afib.  The various methods of treatment have been discussed with the patient and family. After consideration of risks, benefits and other options for treatment, the patient has consented to  Procedure(s): TRANSESOPHAGEAL ECHOCARDIOGRAM (TEE) (N/A) as a surgical intervention.  The patient's history has been reviewed, patient examined, no change in status, stable for surgery.  I have reviewed the patient's chart and labs.  Questions were answered to the patient's satisfaction.     [name]

## 2019-11-02 NOTE — Discharge Instructions (Signed)

## 2019-11-02 NOTE — CV Procedure (Signed)
Moderate LAE no LAA thrombus "smoke" Mild to moderate MR with A1 prolapse Moderate TR Moderate RAE with pacing wires in RA/RV EF 35-40% diffuse hypokinesis worse in inferior wall Moderate effusion lateral to the RV free wall on 4 chamber view Mild AR Normal aortic root  Ok to proceed with ablation in am   [name] MD [facility]

## 2019-11-02 NOTE — Progress Notes (Signed)
  Echocardiogram Echocardiogram Transesophageal has been performed.  Chad Brown 11/02/2019, 8:22 AM

## 2019-11-03 ENCOUNTER — Ambulatory Visit (HOSPITAL_COMMUNITY): Payer: Medicare PPO | Admitting: Anesthesiology

## 2019-11-03 ENCOUNTER — Ambulatory Visit (HOSPITAL_COMMUNITY)
Admission: RE | Admit: 2019-11-03 | Discharge: 2019-11-03 | Disposition: A | Discharge status: Home / Self Care | Payer: Medicare PPO | Attending: Internal Medicine | Admitting: Internal Medicine

## 2019-11-03 ENCOUNTER — Encounter (HOSPITAL_COMMUNITY)
Admission: RE | Disposition: A | Discharge status: Home / Self Care | Payer: Self-pay | Source: Home / Self Care | Attending: Internal Medicine

## 2019-11-03 ENCOUNTER — Other Ambulatory Visit: Payer: Self-pay

## 2019-11-03 ENCOUNTER — Encounter (HOSPITAL_COMMUNITY): Payer: Self-pay | Admitting: Internal Medicine

## 2019-11-03 DIAGNOSIS — Z841 Family history of disorders of kidney and ureter: Secondary | ICD-10-CM | POA: Diagnosis not present

## 2019-11-03 DIAGNOSIS — Z881 Allergy status to other antibiotic agents status: Secondary | ICD-10-CM | POA: Insufficient documentation

## 2019-11-03 DIAGNOSIS — H409 Unspecified glaucoma: Secondary | ICD-10-CM | POA: Diagnosis not present

## 2019-11-03 DIAGNOSIS — I4819 Other persistent atrial fibrillation: Secondary | ICD-10-CM | POA: Insufficient documentation

## 2019-11-03 DIAGNOSIS — I5022 Chronic systolic (congestive) heart failure: Secondary | ICD-10-CM | POA: Diagnosis not present

## 2019-11-03 DIAGNOSIS — M1990 Unspecified osteoarthritis, unspecified site: Secondary | ICD-10-CM | POA: Diagnosis not present

## 2019-11-03 DIAGNOSIS — G4733 Obstructive sleep apnea (adult) (pediatric): Secondary | ICD-10-CM | POA: Diagnosis not present

## 2019-11-03 DIAGNOSIS — R3911 Hesitancy of micturition: Secondary | ICD-10-CM | POA: Insufficient documentation

## 2019-11-03 DIAGNOSIS — Z888 Allergy status to other drugs, medicaments and biological substances status: Secondary | ICD-10-CM | POA: Diagnosis not present

## 2019-11-03 DIAGNOSIS — Z8249 Family history of ischemic heart disease and other diseases of the circulatory system: Secondary | ICD-10-CM | POA: Diagnosis not present

## 2019-11-03 DIAGNOSIS — I252 Old myocardial infarction: Secondary | ICD-10-CM | POA: Insufficient documentation

## 2019-11-03 DIAGNOSIS — N183 Chronic kidney disease, stage 3 unspecified: Secondary | ICD-10-CM | POA: Insufficient documentation

## 2019-11-03 DIAGNOSIS — Z96641 Presence of right artificial hip joint: Secondary | ICD-10-CM | POA: Diagnosis not present

## 2019-11-03 DIAGNOSIS — I130 Hypertensive heart and chronic kidney disease with heart failure and stage 1 through stage 4 chronic kidney disease, or unspecified chronic kidney disease: Secondary | ICD-10-CM | POA: Diagnosis not present

## 2019-11-03 DIAGNOSIS — E785 Hyperlipidemia, unspecified: Secondary | ICD-10-CM | POA: Insufficient documentation

## 2019-11-03 DIAGNOSIS — Z95810 Presence of automatic (implantable) cardiac defibrillator: Secondary | ICD-10-CM | POA: Diagnosis not present

## 2019-11-03 DIAGNOSIS — N401 Enlarged prostate with lower urinary tract symptoms: Secondary | ICD-10-CM | POA: Diagnosis not present

## 2019-11-03 DIAGNOSIS — Z87891 Personal history of nicotine dependence: Secondary | ICD-10-CM | POA: Diagnosis not present

## 2019-11-03 DIAGNOSIS — Z7901 Long term (current) use of anticoagulants: Secondary | ICD-10-CM | POA: Insufficient documentation

## 2019-11-03 DIAGNOSIS — Z79899 Other long term (current) drug therapy: Secondary | ICD-10-CM | POA: Diagnosis not present

## 2019-11-03 HISTORY — PX: ATRIAL FIBRILLATION ABLATION: EP1191

## 2019-11-03 LAB — POCT ACTIVATED CLOTTING TIME
Activated Clotting Time: 301 seconds
Activated Clotting Time: 335 seconds

## 2019-11-03 SURGERY — ATRIAL FIBRILLATION ABLATION
Anesthesia: General

## 2019-11-03 MED ORDER — ROCURONIUM BROMIDE 10 MG/ML (PF) SYRINGE
PREFILLED_SYRINGE | INTRAVENOUS | Status: DC | PRN
  Administered 2019-11-03 (×2): 50 mg via INTRAVENOUS

## 2019-11-03 MED ORDER — HEPARIN SODIUM (PORCINE) 1000 UNIT/ML IJ SOLN
INTRAMUSCULAR | Status: DC | PRN
  Administered 2019-11-03: 1000 [IU] via INTRAVENOUS
  Administered 2019-11-03: 14000 [IU] via INTRAVENOUS

## 2019-11-03 MED ORDER — PROTAMINE SULFATE 10 MG/ML IV SOLN
INTRAVENOUS | Status: DC | PRN
  Administered 2019-11-03: 40 mg via INTRAVENOUS

## 2019-11-03 MED ORDER — SUGAMMADEX SODIUM 200 MG/2ML IV SOLN
INTRAVENOUS | Status: DC | PRN
  Administered 2019-11-03: 200 mg via INTRAVENOUS

## 2019-11-03 MED ORDER — HYDROCODONE-ACETAMINOPHEN 5-325 MG PO TABS: 1.0000 | ORAL_TABLET | ORAL | Status: DC | PRN

## 2019-11-03 MED ORDER — LIDOCAINE 2% (20 MG/ML) 5 ML SYRINGE
INTRAMUSCULAR | Status: DC | PRN
  Administered 2019-11-03: 100 mg via INTRAVENOUS

## 2019-11-03 MED ORDER — PANTOPRAZOLE SODIUM 40 MG PO TBEC: 40.0000 mg | DELAYED_RELEASE_TABLET | Freq: Every day | ORAL | 0 refills | Status: DC

## 2019-11-03 MED ORDER — SUGAMMADEX SODIUM 200 MG/2ML IV SOLN: INTRAVENOUS | Status: DC | PRN

## 2019-11-03 MED ORDER — ACETAMINOPHEN 325 MG PO TABS: 650.0000 mg | ORAL_TABLET | ORAL | Status: DC | PRN

## 2019-11-03 MED ORDER — SODIUM CHLORIDE 0.9% FLUSH: 3.0000 mL | INTRAVENOUS | Status: DC | PRN

## 2019-11-03 MED ORDER — SODIUM CHLORIDE 0.9 % IV SOLN: 250.0000 mL | INTRAVENOUS | Status: DC | PRN

## 2019-11-03 MED ORDER — FENTANYL CITRATE (PF) 250 MCG/5ML IJ SOLN
INTRAMUSCULAR | Status: DC | PRN
  Administered 2019-11-03: 50 ug via INTRAVENOUS

## 2019-11-03 MED ORDER — HEPARIN SODIUM (PORCINE) 1000 UNIT/ML IJ SOLN
INTRAMUSCULAR | Status: AC
  Filled 2019-11-03: qty 1

## 2019-11-03 MED ORDER — HEPARIN (PORCINE) IN NACL 1000-0.9 UT/500ML-% IV SOLN
INTRAVENOUS | Status: DC | PRN
  Administered 2019-11-03: 500 mL

## 2019-11-03 MED ORDER — PHENYLEPHRINE HCL-NACL 10-0.9 MG/250ML-% IV SOLN
INTRAVENOUS | Status: DC | PRN
  Administered 2019-11-03: 40 ug/min via INTRAVENOUS

## 2019-11-03 MED ORDER — HEPARIN SODIUM (PORCINE) 1000 UNIT/ML IJ SOLN
INTRAMUSCULAR | Status: DC | PRN
  Administered 2019-11-03: 3000 [IU] via INTRAVENOUS
  Administered 2019-11-03: 1000 [IU] via INTRAVENOUS

## 2019-11-03 MED ORDER — AMIODARONE HCL 200 MG PO TABS: 200.0000 mg | ORAL_TABLET | Freq: Every day | ORAL | 3 refills | Status: DC

## 2019-11-03 MED ORDER — ONDANSETRON HCL 4 MG/2ML IJ SOLN
INTRAMUSCULAR | Status: DC | PRN
  Administered 2019-11-03: 4 mg via INTRAVENOUS

## 2019-11-03 MED ORDER — SODIUM CHLORIDE 0.9% FLUSH: 3.0000 mL | Freq: Two times a day (BID) | INTRAVENOUS | Status: DC

## 2019-11-03 MED ORDER — PROPOFOL 10 MG/ML IV BOLUS
INTRAVENOUS | Status: DC | PRN
  Administered 2019-11-03 (×2): 40 mg via INTRAVENOUS

## 2019-11-03 MED ORDER — PHENYLEPHRINE HCL (PRESSORS) 10 MG/ML IV SOLN
INTRAVENOUS | Status: DC | PRN
  Administered 2019-11-03: 40 ug via INTRAVENOUS
  Administered 2019-11-03: 80 ug via INTRAVENOUS
  Administered 2019-11-03 (×2): 40 ug via INTRAVENOUS
  Administered 2019-11-03: 80 ug via INTRAVENOUS

## 2019-11-03 MED ORDER — ONDANSETRON HCL 4 MG/2ML IJ SOLN: 4.0000 mg | Freq: Four times a day (QID) | INTRAMUSCULAR | Status: DC | PRN

## 2019-11-03 MED ORDER — HEPARIN (PORCINE) IN NACL 1000-0.9 UT/500ML-% IV SOLN
INTRAVENOUS | Status: AC
  Filled 2019-11-03: qty 500

## 2019-11-03 MED ORDER — SODIUM CHLORIDE 0.9 % IV SOLN: INTRAVENOUS | Status: DC

## 2019-11-03 SURGICAL SUPPLY — 19 items
BLANKET WARM UNDERBOD FULL ACC (MISCELLANEOUS) ×3 IMPLANT
CATH MAPPNG PENTARAY F 2-6-2MM (CATHETERS) ×1 IMPLANT
CATH SMTCH THERMOCOOL SF DF (CATHETERS) ×3 IMPLANT
CATH SOUNDSTAR ECO 8FR (CATHETERS) ×3 IMPLANT
CATH WEBSTER BI DIR CS D-F CRV (CATHETERS) ×3 IMPLANT
COVER SWIFTLINK CONNECTOR (BAG) ×3 IMPLANT
DEVICE CLOSURE PERCLS PRGLD 6F (VASCULAR PRODUCTS) ×3 IMPLANT
NEEDLE BAYLIS TRANSSEPTAL 71CM (NEEDLE) ×3 IMPLANT
PACK EP LATEX FREE (CUSTOM PROCEDURE TRAY) ×2
PACK EP LF (CUSTOM PROCEDURE TRAY) ×1 IMPLANT
PAD PRO RADIOLUCENT 2001M-C (PAD) ×3 IMPLANT
PATCH CARTO3 (PAD) ×3 IMPLANT
PENTARAY F 2-6-2MM (CATHETERS) ×3
PERCLOSE PROGLIDE 6F (VASCULAR PRODUCTS) ×9
SHEATH PINNACLE 7F 10CM (SHEATH) ×9 IMPLANT
SHEATH PINNACLE 9F 10CM (SHEATH) ×3 IMPLANT
SHEATH PROBE COVER 6X72 (BAG) ×3 IMPLANT
SHEATH SWARTZ TS SL2 63CM 8.5F (SHEATH) ×3 IMPLANT
TUBING SMART ABLATE COOLFLOW (TUBING) ×3 IMPLANT

## 2019-11-03 NOTE — Progress Notes (Signed)
Up and walked and tolerated well; right groin stable, no bleeding or hematoma; right groin tender

## 2019-11-03 NOTE — Interval H&P Note (Signed)
History and Physical Interval Note:  11/03/2019 11:06 AM  Chad Brown  has presented today for surgery, with the diagnosis of Atrail Fibrillation.  The various methods of treatment have been discussed with the patient and family. After consideration of risks, benefits and other options for treatment, the patient has consented to  Procedure(s): ATRIAL FIBRILLATION ABLATION (N/A) as a surgical intervention.  The patient's history has been reviewed, patient examined, no change in status, stable for surgery.  I have reviewed the patient's chart and labs.  Questions were answered to the patient's satisfaction.    TEE reviewed with patient.  He reports compliance with xarelto without interruption.  Thompson Grayer

## 2019-11-03 NOTE — Anesthesia Preprocedure Evaluation (Addendum)
Anesthesia Evaluation  Patient identified by MRN, date of birth, ID band Patient awake    Reviewed: Allergy & Precautions, NPO status , Patient's Chart, lab work & pertinent test results, reviewed documented beta blocker date and time   Airway Mallampati: II  TM Distance: >3 FB Neck ROM: Full    Dental  (+) Edentulous Upper, Edentulous Lower, Dental Advisory Given   Pulmonary sleep apnea and Continuous Positive Airway Pressure Ventilation , former smoker,    Pulmonary exam normal breath sounds clear to auscultation       Cardiovascular hypertension, Pt. on medications and Pt. on home beta blockers + Past MI, + Peripheral Vascular Disease and +CHF  + dysrhythmias Atrial Fibrillation + Cardiac Defibrillator  Rhythm:Irregular Rate:Normal  Echo 11/02/2019 1. Left ventricular ejection fraction, by visual estimation, is 35 to 40%. The left ventricle has moderately decreased function. There is no left ventricular hypertrophy.  2. Mildly dilated left ventricular internal cavity size.  3. The left ventricle demonstrates global hypokinesis.  4. Diffuse hypokinesis worse in the inferior wall.  5. Global right ventricle has normal systolic function.The right ventricular size is normal. No increase in right ventricular wall thickness.  6. Pacing wires in RA/RV.  7. Left atrial size was moderately dilated.  8. Smoke in LAA but no LAA thrombus Ok to proceed with ablation in am.  9. Right atrial size was mildly dilated.  10. Moderate pericardial effusion.  11. The pericardial effusion is Lateral to RV free wall.  12. The mitral valve is degenerative. Mild to moderate mitral valve regurgitation.  13. Some prolapse to anterior leaflet. MR orifice located on A1/P! segments on 3 D imaging.  14. The tricuspid valve is normal in structure.  15. The tricuspid valve is normal in structure. Tricuspid valve regurgitation is moderate.  16. The aortic  valve is tricuspid. Aortic valve regurgitation is trivial. Mild to moderate aortic valve sclerosis/calcification without any evidence of aortic stenosis.  17. The pulmonic valve was grossly normal. Pulmonic valve regurgitation is mild.  18. Mildly elevated pulmonary artery systolic pressure.    Pacemaker ~98% AP-VP   Neuro/Psych PSYCHIATRIC DISORDERS Depression negative neurological ROS     GI/Hepatic negative GI ROS, Neg liver ROS,   Endo/Other  Hyperlipidemia  Renal/GU negative Renal ROS  negative genitourinary   Musculoskeletal  (+) Arthritis , Osteoarthritis,    Abdominal (+) + obese,   Peds  Hematology  (+) anemia , Thrombocytopenia Xarelto- last dose this am   Anesthesia Other Findings   Reproductive/Obstetrics                           Anesthesia Physical  Anesthesia Plan  ASA: IV  Anesthesia Plan: General   Post-op Pain Management:    Induction: Intravenous  PONV Risk Score and Plan: 2 and Propofol infusion, Ondansetron and Treatment may vary due to age or medical condition  Airway Management Planned: Oral ETT  Additional Equipment: None  Intra-op Plan:   Post-operative Plan: Extubation in OR  Informed Consent: I have reviewed the patients History and Physical, chart, labs and discussed the procedure including the risks, benefits and alternatives for the proposed anesthesia with the patient or authorized representative who has indicated his/her understanding and acceptance.     Dental advisory given  Plan Discussed with: CRNA  Anesthesia Plan Comments:         Anesthesia Quick Evaluation

## 2019-11-03 NOTE — Transfer of Care (Signed)
Immediate Anesthesia Transfer of Care Note  Patient: Chad Brown  Procedure(s) Performed: ATRIAL FIBRILLATION ABLATION (N/A )  Patient Location: Cath Lab  Anesthesia Type:General  Level of Consciousness: awake, alert  and oriented  Airway & Oxygen Therapy: Patient Spontanous Breathing  Post-op Assessment: Report given to RN, Post -op Vital signs reviewed and stable and Patient moving all extremities  Post vital signs: Reviewed and stable  Last Vitals:  Vitals Value Taken Time  BP 98/57 11/03/19 1410  Temp 36.8 C 11/03/19 1409  Pulse 61 11/03/19 1412  Resp 12 11/03/19 1412  SpO2 100 % 11/03/19 1412  Vitals shown include unvalidated device data.  Last Pain:  Vitals:   11/03/19 1409  TempSrc: Temporal  PainSc: 0-No pain      Patients Stated Pain Goal: 5 (32/12/24 8250)  Complications: No apparent anesthesia complications

## 2019-11-03 NOTE — Anesthesia Procedure Notes (Signed)
Procedure Name: Intubation Date/Time: 11/03/2019 11:34 AM Performed by: Janace Litten, CRNA Pre-anesthesia Checklist: Patient identified, Emergency Drugs available, Suction available and Patient being monitored Patient Re-evaluated:Patient Re-evaluated prior to induction Oxygen Delivery Method: Circle System Utilized Preoxygenation: Pre-oxygenation with 100% oxygen Induction Type: IV induction Ventilation: Mask ventilation without difficulty Laryngoscope Size: Mac and 4 Grade View: Grade I Tube type: Oral Tube size: 7.5 mm Number of attempts: 1 Airway Equipment and Method: Stylet Placement Confirmation: ETT inserted through vocal cords under direct vision,  positive ETCO2 and breath sounds checked- equal and bilateral Secured at: 21 cm Tube secured with: Tape Dental Injury: Teeth and Oropharynx as per pre-operative assessment

## 2019-11-03 NOTE — Discharge Instructions (Signed)
Cardiac Ablation, Care After This sheet gives you information about how to care for yourself after your procedure. Your health care provider may also give you more specific instructions. If you have problems or questions, contact your health care provider. What can I expect after the procedure? After the procedure, it is common to have:  Bruising around your puncture site.  Tenderness around your puncture site.  Skipped heartbeats.  Tiredness (fatigue). Follow these instructions at home: Puncture site care   Follow instructions from your health care provider about how to take care of your puncture site. Make sure you: ? Wash your hands with soap and water before you change your bandage (dressing). If soap and water are not available, use hand sanitizer. ? Change your dressing as told by your health care provider. ? Leave stitches (sutures), skin glue, or adhesive strips in place. These skin closures may need to stay in place for up to 2 weeks. If adhesive strip edges start to loosen and curl up, you may trim the loose edges. Do not remove adhesive strips completely unless your health care provider tells you to do that.  Check your puncture site every day for signs of infection. Check for: ? Redness, swelling, or pain. ? Fluid or blood. If your puncture site starts to bleed, lie down on your back, apply firm pressure to the area, and contact your health care provider. ? Warmth. ? Pus or a bad smell. Driving  Ask your health care provider when it is safe for you to drive again after the procedure.  Do not drive or use heavy machinery while taking prescription pain medicine.  Do not drive for 24 hours if you were given a medicine to help you relax (sedative) during your procedure. Activity  Avoid activities that take a lot of effort for at least 3 days after your procedure.  Do not lift anything that is heavier than 5 lbs for week, or the limit that you are told, until your health  care provider says that it is safe.  Return to your normal activities as told by your health care provider. Ask your health care provider what activities are safe for you. General instructions  Take over-the-counter and prescription medicines only as told by your health care provider.  Do not use any products that contain nicotine or tobacco, such as cigarettes and e-cigarettes. If you need help quitting, ask your health care provider.  Do not take baths, swim, or use a hot tub until your health care provider approves.  Do not drink alcohol for 24 hours after your procedure.  Keep all follow-up visits as told by your health care provider. This is important. Contact a health care provider if:  You have redness, mild swelling, or pain around your puncture site.  You have fluid or blood coming from your puncture site that stops after applying firm pressure to the area.  Your puncture site feels warm to the touch.  You have pus or a bad smell coming from your puncture site.  You have a fever.  You have chest pain or discomfort that spreads to your neck, jaw, or arm.  You are sweating a lot.  You feel nauseous.  You have a fast or irregular heartbeat.  You have shortness of breath.  You are dizzy or light-headed and feel the need to lie down.  You have pain or numbness in leg . Get help right away if:  Your puncture site suddenly swells.  Your puncture site  is bleeding and the bleeding does not stop after applying firm pressure to the area. These symptoms may represent a serious problem that is an emergency. Do not wait to see if the symptoms will go away. Get medical help right away. Call your local emergency services (911 in the [place] Do not drive yourself to the hospital. Summary  After the procedure, it is normal to have bruising and tenderness at the puncture site in your groin, neck, or forearm.  Check your puncture site every day for signs of infection.  Get help  right away if your puncture site is bleeding and the bleeding does not stop after applying firm pressure to the area. This is a medical emergency. This information is not intended to replace advice given to you by your health care provider. Make sure you discuss any questions you have with your health care provider. Document Revised: 08/30/2017 Document Reviewed: 12/27/2016 Elsevier Patient Education  [place].    Femoral Site Care This sheet gives you information about how to care for yourself after your procedure. Your health care provider may also give you more specific instructions. If you have problems or questions, contact your health care provider. What can I expect after the procedure? After the procedure, it is common to have:  Bruising that usually fades within 1-2 weeks.  Tenderness at the site. Follow these instructions at home: Wound care  Follow instructions from your health care provider about how to take care of your insertion site. Make sure you: ? Wash your hands with soap and water before you change your bandage (dressing). If soap and water are not available, use hand sanitizer. ? Change your dressing as told by your health care provider. ? Leave stitches (sutures), skin glue, or adhesive strips in place. These skin closures may need to stay in place for 2 weeks or longer. If adhesive strip edges start to loosen and curl up, you may trim the loose edges. Do not remove adhesive strips completely unless your health care provider tells you to do that.  Do not take baths, swim, or use a hot tub until your health care provider approves.  You may shower 24-48 hours after the procedure or as told by your health care provider. ? Gently wash the site with plain soap and water. ? Pat the area dry with a clean towel. ? Do not rub the site. This may cause bleeding.  Do not apply powder or lotion to the site. Keep the site clean and dry.  Check your femoral site every  day for signs of infection. Check for: ? Redness, swelling, or pain. ? Fluid or blood. ? Warmth. ? Pus or a bad smell. Activity  For the first 2-3 days after your procedure, or as long as directed: ? Avoid climbing stairs as much as possible. ? Do not squat.  Do not lift anything that is heavier than 5 lbs, or the limit that you are told, until your health care provider says that it is safe.  Rest as directed. ? Avoid sitting for a long time without moving. Get up to take short walks every 1-2 hours.  Do not drive for 24 hours if you were given a medicine to help you relax (sedative). General instructions  Take over-the-counter and prescription medicines only as told by your health care provider.  Keep all follow-up visits as told by your health care provider. This is important. Contact a health care provider if you have:  A fever  or chills.  You have redness, swelling, or pain around your insertion site. Get help right away if:  The catheter insertion area swells very fast.  You pass out.  You suddenly start to sweat or your skin gets clammy.  The catheter insertion area is bleeding, and the bleeding does not stop when you hold steady pressure on the area.  The area near or just beyond the catheter insertion site becomes pale, cool, tingly, or numb. These symptoms may represent a serious problem that is an emergency. Do not wait to see if the symptoms will go away. Get medical help right away. Call your local emergency services (911 in the [place] Do not drive yourself to the hospital. Summary  After the procedure, it is common to have bruising that usually fades within 1-2 weeks.  Check your femoral site every day for signs of infection.  Do not lift anything that is heavier than 10 lb (4.5 kg), or the limit that you are told, until your health care provider says that it is safe. This information is not intended to replace advice given to you by your health care  provider. Make sure you discuss any questions you have with your health care provider. Document Revised: 09/30/2017 Document Reviewed: 09/30/2017 Elsevier Patient Education  2020 [place] procedure care instructions No driving for 4 days. No lifting over 5 lbs for 1 week. No vigorous or sexual activity for 1 week. You may return to work/your usual activities on 11/10/2019. Keep procedure site clean & dry. If you notice increased pain, swelling, bleeding or pus, call/return!  You may shower, but no soaking baths/hot tubs/pools for 1 week.

## 2019-11-04 ENCOUNTER — Ambulatory Visit (INDEPENDENT_AMBULATORY_CARE_PROVIDER_SITE_OTHER): Payer: Medicare PPO

## 2019-11-04 DIAGNOSIS — Z95810 Presence of automatic (implantable) cardiac defibrillator: Secondary | ICD-10-CM

## 2019-11-04 DIAGNOSIS — I5022 Chronic systolic (congestive) heart failure: Secondary | ICD-10-CM

## 2019-11-04 NOTE — Progress Notes (Signed)
EPIC Encounter for ICM Monitoring  Patient Name: Chad Brown is a 79 y.o. male Date: 11/04/2019 Primary Care Physican: Haywood Pao, MD Primary Cardiologist:Nishan/McLean Electrophysiologist: Allred Bi-V Pacing: 96.1% 11/04/2019 Weight: 213lbs  Spoke withpatient.He reports weight loss of 9 pounds and shortness of breath resolved since taking extra Furosemide.  He is a little sore from having the ablation yesterday and DR Allred told him he is in [facility] after the procedure.     Optivol thoracic impedance returned to normal since 10/26/2019 remote transmission.  Prescribed:Furosemide40 mg1tablet (40 mg total)daily.Potassium 10 mEq take 1 tabletevery other day.  Labs: 08/25/2019 Creatinine 1.62, BUN 18, Potassium 3.6, Sodium 137, GFR 40-46 07/27/2019 Creatinine 1.76, BUN 21, Potassium 4.7, Sodium 141, GFR 36-42 01/20/2019 Creatinine 1.76, BUN 25, Potassium 4.5, Sodium 139, GFR 36-42 10/22/2018 Creatinine 1.53, BUN 16, Potassium 4.4, Sodium 139, GFR 43-50  Recommendations: No changes and encouraged to call if experiencing any fluid symptoms.  Follow-up plan: ICM clinic phone appointment on 12/04/2019.  91 day device clinic remote transmission 12/03/2019.     Office visit with Dr Aundra Dubin on 11/12/2019 with echo.  Copy of ICM check sent to Dr. Rayann Heman.   3 month ICM trend: 11/04/2019    1 Year ICM trend:       Rosalene Billings, RN 11/04/2019 2:04 PM

## 2019-11-05 NOTE — Anesthesia Postprocedure Evaluation (Signed)
Anesthesia Post Note  Patient: Chad Brown  Procedure(s) Performed: ATRIAL FIBRILLATION ABLATION (N/A )     Patient location during evaluation: PACU Anesthesia Type: General Level of consciousness: sedated and patient cooperative Pain management: pain level controlled Vital Signs Assessment: post-procedure vital signs reviewed and stable Respiratory status: spontaneous breathing Cardiovascular status: stable Anesthetic complications: no    Last Vitals:  Vitals:   11/03/19 1639 11/03/19 1709  BP: (!) 109/58 105/63  Pulse: 60 (!) 59  Resp:    Temp:    SpO2: 97% 97%    Last Pain:  Vitals:   11/03/19 1524  TempSrc:   PainSc: 0-No pain                 Nolon Nations

## 2019-11-06 ENCOUNTER — Telehealth: Payer: Self-pay

## 2019-11-06 DIAGNOSIS — I5022 Chronic systolic (congestive) heart failure: Secondary | ICD-10-CM

## 2019-11-06 MED ORDER — FUROSEMIDE 40 MG PO TABS: ORAL_TABLET | ORAL | 3 refills | Status: DC

## 2019-11-06 NOTE — Telephone Encounter (Signed)
Spoke with patient.  Advised Dr Aundra Dubin recommended to take Lasix 60 mg twice a day and have labs drawn in 1 week.  Patient has enough supply on hand to increase Lasix and will call when refill is needed.  BMET scheduled for 11/12/2019 which is same day as HF clinic appointment.   Advised patient to monitor daily weight and to call office Monday morning if weight does not decrease.  Advised to call 911 if symptoms become urgent.

## 2019-11-06 NOTE — Telephone Encounter (Signed)
Returned patient call as requested by voice mail message.  Patient reports the following symptoms since 2/3:   1. 7 pound weight gain since 11/04/2019.  Weight today is 220 lbs.  2. SOB when walking the dog and feels like he "gives out" easily when walking.   3. Does not think the increase in Furosemide has increased urination.  Optivol thoracic impedancesuggesting return of possible fluid since 2/3 remote.  He was instructed by Dr Rayann Heman on 2/2 to take Furosemide80 mgAM and 40 PM x 4 days (last increased dosage will be 2/6).  Prescribed:  Furosemide 40 mg take 1 tablet twice a day Potassium 10 mEq take 2 tablets daily.  Labs: 08/25/2019 Creatinine 1.62, BUN 18, Potassium 3.6, Sodium 137, GFR 40-46 07/27/2019 Creatinine 1.76, BUN 21, Potassium 4.7, Sodium 141, GFR 36-42 01/20/2019 Creatinine 1.76, BUN 25, Potassium 4.5, Sodium 139, GFR 36-42 10/22/2018 Creatinine 1.53, BUN 16, Potassium 4.4, Sodium 139, GFR 43-50  Recommendations:Copy sent to Dr Aundra Dubin for review and recommendations.  Office visit with Dr Aundra Dubin on 11/12/2019 with echo.

## 2019-11-06 NOTE — Telephone Encounter (Signed)
Increase Lasix to 60 mg bid.  BMET 1 week.

## 2019-11-11 ENCOUNTER — Telehealth (HOSPITAL_COMMUNITY): Payer: Self-pay

## 2019-11-11 NOTE — Telephone Encounter (Signed)
COVID-19 pre-appointment screening questions: PATIENTS WIFE ANSWERED QUESTIONS FOR PATIENT    Do you have a history of COVID-19 or a positive test result in the past 7-10 days? NO  To the best of your knowledge, have you been in close contact with anyone with a confirmed diagnosis of COVID 19?NO  Have you had any one or more of the following: Fever, chills, cough, shortness of breath (out of the normal for you) or any flu-like symptoms?NO   Are you experiencing any of the following symptoms that is new or out of usual for you:NO   . Ear, nose or throat discomfort . Sore throat . Headache . Muscle Pain . Diarrhea . Loss of taste or smell   . Reviewed all the following with patient: REVIEWED . Use of hand sanitizer when entering the building . Everyone is required to wear a mask in the building, if you do not have a mask we are happy to provide you with one when you arrive . NO Visitor guidelines   If patient answers YES to any of questions they must change to a virtual visit and place note in comments about symptoms

## 2019-11-12 ENCOUNTER — Other Ambulatory Visit: Payer: Self-pay

## 2019-11-12 ENCOUNTER — Ambulatory Visit (HOSPITAL_COMMUNITY)
Admission: RE | Admit: 2019-11-12 | Discharge: 2019-11-12 | Disposition: A | Discharge status: Home / Self Care | Payer: Medicare PPO | Source: Ambulatory Visit | Attending: Cardiology | Admitting: Cardiology

## 2019-11-12 ENCOUNTER — Ambulatory Visit (HOSPITAL_BASED_OUTPATIENT_CLINIC_OR_DEPARTMENT_OTHER)
Admission: RE | Admit: 2019-11-12 | Discharge: 2019-11-12 | Disposition: A | Discharge status: Home / Self Care | Payer: Medicare PPO | Source: Ambulatory Visit | Attending: Cardiology | Admitting: Cardiology

## 2019-11-12 ENCOUNTER — Other Ambulatory Visit (HOSPITAL_COMMUNITY): Payer: Medicare PPO

## 2019-11-12 ENCOUNTER — Encounter (HOSPITAL_COMMUNITY): Payer: Self-pay | Admitting: Cardiology

## 2019-11-12 VITALS — BP 150/88 | HR 64 | Wt 219.4 lb

## 2019-11-12 DIAGNOSIS — E785 Hyperlipidemia, unspecified: Secondary | ICD-10-CM | POA: Insufficient documentation

## 2019-11-12 DIAGNOSIS — R739 Hyperglycemia, unspecified: Secondary | ICD-10-CM

## 2019-11-12 DIAGNOSIS — D649 Anemia, unspecified: Secondary | ICD-10-CM | POA: Diagnosis not present

## 2019-11-12 DIAGNOSIS — I480 Paroxysmal atrial fibrillation: Secondary | ICD-10-CM | POA: Diagnosis not present

## 2019-11-12 DIAGNOSIS — Z950 Presence of cardiac pacemaker: Secondary | ICD-10-CM | POA: Diagnosis not present

## 2019-11-12 DIAGNOSIS — Z87891 Personal history of nicotine dependence: Secondary | ICD-10-CM | POA: Insufficient documentation

## 2019-11-12 DIAGNOSIS — Z7901 Long term (current) use of anticoagulants: Secondary | ICD-10-CM | POA: Insufficient documentation

## 2019-11-12 DIAGNOSIS — I5022 Chronic systolic (congestive) heart failure: Secondary | ICD-10-CM

## 2019-11-12 DIAGNOSIS — Z79899 Other long term (current) drug therapy: Secondary | ICD-10-CM | POA: Diagnosis not present

## 2019-11-12 DIAGNOSIS — I739 Peripheral vascular disease, unspecified: Secondary | ICD-10-CM | POA: Diagnosis not present

## 2019-11-12 DIAGNOSIS — R778 Other specified abnormalities of plasma proteins: Secondary | ICD-10-CM | POA: Diagnosis not present

## 2019-11-12 DIAGNOSIS — I4819 Other persistent atrial fibrillation: Secondary | ICD-10-CM

## 2019-11-12 DIAGNOSIS — G4733 Obstructive sleep apnea (adult) (pediatric): Secondary | ICD-10-CM | POA: Diagnosis not present

## 2019-11-12 DIAGNOSIS — I428 Other cardiomyopathies: Secondary | ICD-10-CM

## 2019-11-12 DIAGNOSIS — N189 Chronic kidney disease, unspecified: Secondary | ICD-10-CM | POA: Diagnosis not present

## 2019-11-12 DIAGNOSIS — Z8249 Family history of ischemic heart disease and other diseases of the circulatory system: Secondary | ICD-10-CM | POA: Insufficient documentation

## 2019-11-12 DIAGNOSIS — Z85820 Personal history of malignant melanoma of skin: Secondary | ICD-10-CM | POA: Insufficient documentation

## 2019-11-12 LAB — BASIC METABOLIC PANEL
Anion gap: 8 (ref 5–15)
BUN: 16 mg/dL (ref 8–23)
CO2: 28 mmol/L (ref 22–32)
Calcium: 8.4 mg/dL — ABNORMAL LOW (ref 8.9–10.3)
Chloride: 103 mmol/L (ref 98–111)
Creatinine, Ser: 1.82 mg/dL — ABNORMAL HIGH (ref 0.61–1.24)
GFR calc Af Amer: 40 mL/min — ABNORMAL LOW (ref >60)
GFR calc non Af Amer: 35 mL/min — ABNORMAL LOW (ref >60)
Glucose, Bld: 103 mg/dL — ABNORMAL HIGH (ref 70–99)
Potassium: 4 mmol/L (ref 3.5–5.1)
Sodium: 139 mmol/L (ref 135–145)

## 2019-11-12 MED ORDER — SACUBITRIL-VALSARTAN 24-26 MG PO TABS: 1.0000 | ORAL_TABLET | Freq: Two times a day (BID) | ORAL | 5 refills | Status: DC

## 2019-11-12 MED ORDER — AMIODARONE HCL 200 MG PO TABS: 200.0000 mg | ORAL_TABLET | Freq: Every day | ORAL | 5 refills | Status: DC

## 2019-11-12 NOTE — Patient Instructions (Addendum)
STOP Losartan   DECREASE Amiodarone to 200mg  1 tab daily   START Entresto 24/26mg  1 tab twice a day   You have been referred to the lipid clinic to manage your cholesterol.  You will be called to scheduled this appointment   Labs today and repeat in 10 days at [facility]. A script was sent  We will only contact you if something comes back abnormal or we need to make some changes. Otherwise no news is good news!  Your physician recommends that you schedule a follow-up appointment in: 3 weeks with the Pharmacist and 6 weeks with Dr Aundra Dubin   Please call office at [phone] option 2 if you have any questions or concerns.    At the [facility], you and your health needs are our priority. As part of our continuing mission to provide you with exceptional heart care, we have created designated Provider Care Teams. These Care Teams include your primary Cardiologist (physician) and Advanced Practice Providers (APPs- Physician Assistants and Nurse Practitioners) who all work together to provide you with the care you need, when you need it.   You may see any of the following providers on your designated Care Team at your next follow up: [name] Dr Glori Bickers . Dr Loralie Champagne . Darrick Grinder, NP . Lyda Jester, PA . Audry Riles, PharmD   Please be sure to bring in all your medications bottles to every appointment.

## 2019-11-12 NOTE — Progress Notes (Signed)
  Echocardiogram 2D Echocardiogram has been performed.  Chad Brown 11/12/2019, 9:51 AM

## 2019-11-12 NOTE — Progress Notes (Signed)
Patient ID: Chad Brown, male   DOB: May 21, 1941, 79 y.o.   MRN: [id number] PCP: [facility] HF Cardiologist: Aundra Dubin  79 y.o. with paroxysmal atrial fibrillation and chronic systolic CHF thought to be due to nonischemic cardiomyopathy presents for followup of CHF. He has a cardiac history dating back to 1998, when he was admitted with chest pain and had cardiac cath showing nonobstructive CAD. In 4/14, he was found to be in atrial fibrillation.  Echo showed EF 30-35%.  He had DCCV back to NSR.  He was back in atrial fibrillation in 1/16, and EF was low again on TEE at that time.   Again, he had DCCV.  In 5/16, cardiac MRI showed persistently low EF, so given LBBB, he had Medtronic CRT-D device placed.  He was back in atrial fibrillation in 6/16 and had TEE-guided DCCV again with EF now 15-20% on TEE.  Repeat echo in 2/17 showed EF 55-60%. Echo in 1/19 showed EF 45-50%, mild LV and RV dilation.   Echo in 1/20 showed EF 45-50%, diffuse hypokinesis.   Atrial fibrillation ablation was done in 2/21.  Echo was done today and reviewed, EF 40-45%, PASP 50 mmHg, mild MR.   Patient is doing better than at last appointment in terms of CHF.  Weight is down 6 lbs, he is currently taking Lasix 60 mg bid. REDS clip still 43% but was 50% at last appointment.  He is short of breath after walking about 200 yards or walking up a hill.  Breathing is overall better.  No chest pain.  No orthopnea/PND.   ECG: a-paced with BiV pacing (personally reviewed)  Labs (6/16): LDL 153 Labs (7/16): HCT 40, LFTs normal, LFTs normal Labs (9/16): TSH normal, K 4.3, creatinine 1.03, BNP 419 Labs (10/17): K 4.2, creatinine 1.29, LFTs normal Labs (11/17): hgb 8.9 Labs (6/19): BNP 625 Labs (10/19): K 4.7, creatinine 1.66 => 1.58, LFTs normal Labs (1/20): LDL 128 Labs (11/20): K 3.6, creatinine 1.62 Labs (1/21): K 3.8, creatinine 1.89, BNP 578, LDL 115, TSH normal, LFTs normal  PMH: 1. Atrial fibrillation: Paroxysmal.  Diagnosed  4/14, DCCV 4/14 to NSR.  DCCV 1/16 to NSR.  DCCV 6/16 to NSR.  2. Chronic systolic CHF: Nonischemic cardiomyopathy.  LHC in 1998 with nonobstructive disease.  Cardiolite in 6/14 with no ischemia or infarction.  cMRI (5/16) with EF 34%, mildly dilated LV with diffuse HK worse in anterolateral wall, small punctate areas of LGE in anteroseptum and basal inferior wall (not CAD pattern).  TEE (6/16) with EF 15-20%.  He has Medtronic CRT-D device.  Echo (9/16) with EF 40%, moderate LV dilation, grade II diastolic dysfunction, normal RV size and systolic function, PASP 42 mmHg.  - Hypotension with Entresto.  - Echo (2/17) showed LV functional recovery, EF 55-60% with mild MR.    - Echo (1/19): EF 45-50%, mild LV dilation, mild RV dilation - Echo (1/20): EF 45-50%, diffuse hypokinesis, moderate diastolic dysfunction, normal RV size and systolic function.  - Echo (2/21): EF 40-45%, PASP 50 mmHg, mild MR.  3. Hyperlipidemia: Myalgias with atorvastatin, Livalo, and pravastatin.  4. CKD 5. OA: s/p THR.  6. H/o melanoma 7. Anemia 8. Diverticulosis 9. OSA: Uses CPAP.  10. Pulmonary nodules: Followed by Dr. Delton Coombes.  11. Pancreatic pseudocysts 12. PAD: ABIs (2/20) were 0.94 on right and 1.03 on left.   SH: Married with 3 children, lives in [place], retired, quit smoking in 2001.    FH: Mother with MI  ROS: All  systems reviewed and negative except as per HPI.  Current Outpatient Medications  Medication Sig Dispense Refill  . amiodarone (PACERONE) 200 MG tablet Take 1 tablet (200 mg total) by mouth daily. 30 tablet 5  . carvedilol (COREG) 12.5 MG tablet Take 1 tablet (12.5 mg total) by mouth 2 (two) times daily with a meal. 180 tablet 0  . Coenzyme Q10 (CO Q-10) 200 MG CAPS Take 200 mg by mouth daily.    . dorzolamide-timolol (COSOPT) 22.3-6.8 MG/ML ophthalmic solution Place 1 drop into the right eye 2 (two) times daily.    [name] escitalopram (LEXAPRO) 20 MG tablet Take 1 tablet (20 mg total) by  mouth daily. 90 tablet 0  . ezetimibe (ZETIA) 10 MG tablet Take 1 tablet (10 mg total) by mouth daily. 90 tablet 2  . ferrous sulfate 325 (65 FE) MG tablet Take 325 mg by mouth daily with breakfast.     . finasteride (PROSCAR) 5 MG tablet Take 1 tablet (5 mg total) by mouth daily. 30 tablet 2  . furosemide (LASIX) 40 MG tablet Take 1.5 tablets (60 mg total) twice a day 270 tablet 3  . gabapentin (NEURONTIN) 100 MG capsule Take 200 mg by mouth 2 (two) times daily.     [name] latanoprost (XALATAN) 0.005 % ophthalmic solution Place 1 drop into the right eye at bedtime.     Mckinley Jewel Dimesylate (RHOPRESSA) 0.02 % SOLN Place 1 drop into the right eye at bedtime.     . niacin (NIASPAN) 1000 MG CR tablet Take 1,000 mg by mouth at bedtime.     . nitroGLYCERIN (NITROSTAT) 0.4 MG SL tablet Place 0.4 mg under the tongue every 5 (five) minutes as needed for chest pain.     . pantoprazole (PROTONIX) 40 MG tablet Take 1 tablet (40 mg total) by mouth daily. 45 tablet 0  . potassium chloride (KLOR-CON) 10 MEQ tablet Take 2 tablets (20 mEq total) by mouth daily. 60 tablet 5  . spironolactone (ALDACTONE) 25 MG tablet TAKE (1/2) TABLET DAILY. 90 tablet 3  . tamsulosin (FLOMAX) 0.4 MG CAPS Take 0.4 mg by mouth at bedtime.     Alveda Reasons 20 MG TABS tablet TAKE 1 TABLET ONCE DAILY WITH SUPPER 90 tablet 0  . sacubitril-valsartan (ENTRESTO) 24-26 MG Take 1 tablet by mouth 2 (two) times daily. 60 tablet 5   No current facility-administered medications for this encounter.   BP (!) 150/88   Pulse 64   Wt 99.5 kg (219 lb 6.4 oz)   SpO2 98%   BMI 35.41 kg/m  General: NAD Neck: No JVD, no thyromegaly or thyroid nodule.  Lungs: Clear to auscultation bilaterally with normal respiratory effort. CV: Nondisplaced PMI.  Heart regular S1/S2, no S3/S4, no murmur.  No peripheral edema.  No carotid bruit.  Normal pedal pulses.  Abdomen: Soft, nontender, no hepatosplenomegaly, no distention.  Skin: Intact without lesions or  rashes.  Neurologic: Alert and oriented x 3.  Psych: Normal affect. Extremities: No clubbing or cyanosis.  HEENT: Normal.   Assessment/Plan: 1. Chronic systolic CHF: Suspect nonischemic cardiomyopathy.  EF 15-20% on 6/16 TEE.  Cardiac MRI in 5/16 showed a LGE pattern that was not suggestive of coronary disease (?myocarditis or infiltrative disease).  There may be a component of tachycardia-mediated cardiomyopathy.  However, the cardiac MRI appears to have been done when he was in NSR.  In 9/16, echo showed EF increased to 40%.  Echo in 1/20 showed EF 45-50%.  He has Medtronic CRT-D  device.  Echo (2/21) with EF 40-45%.  At last appointment, he was markedly volume overloaded and in atrial fibrillation.  He is now back in NSR after atrial fibrillation ablation and has been diuresed with lower weight though REDS clip still elevated.   - Continue Lasix 60 mg bid, weight is gradually coming down.    - Stop losartan and start Entresto 24/26 bid, this should allow a bit more diuresis.  - Continue current Coreg and spironolactone.   2. Atrial fibrillation: Poorly tolerated.  Now s/p atrial fibrillation ablation in 2/21.  He is a-paced today.  - Continue Xarelto 20 mg daily.   - Decrease amiodarone to 200 mg daily. LFTs and TSH normal recently.  He should have regular eye exams.   3. Hyperlipidemia: He is only on Zetia, cannot tolerate statins.  Would like to see LDL < 70 with PAD, recently LDL was 115.  - Refer to lipid clinic for [place].  4. OSA: Continue CPAP.   5. PAD: Minimal claudication.     Followup in 6 wks.     Loralie Champagne 11/12/2019

## 2019-11-16 ENCOUNTER — Ambulatory Visit (INDEPENDENT_AMBULATORY_CARE_PROVIDER_SITE_OTHER): Payer: Medicare PPO | Admitting: Pharmacist

## 2019-11-16 ENCOUNTER — Other Ambulatory Visit: Payer: Self-pay

## 2019-11-16 VITALS — BP 115/76 | HR 61

## 2019-11-16 DIAGNOSIS — G720 Drug-induced myopathy: Secondary | ICD-10-CM

## 2019-11-16 DIAGNOSIS — I5022 Chronic systolic (congestive) heart failure: Secondary | ICD-10-CM | POA: Diagnosis not present

## 2019-11-16 DIAGNOSIS — I959 Hypotension, unspecified: Secondary | ICD-10-CM | POA: Diagnosis not present

## 2019-11-16 DIAGNOSIS — E782 Mixed hyperlipidemia: Secondary | ICD-10-CM | POA: Diagnosis not present

## 2019-11-16 DIAGNOSIS — T466X5A Adverse effect of antihyperlipidemic and antiarteriosclerotic drugs, initial encounter: Secondary | ICD-10-CM

## 2019-11-16 MED ORDER — PRALUENT 75 MG/ML ~~LOC~~ SOAJ: 1.0000 "pen " | SUBCUTANEOUS | 11 refills | Status: DC

## 2019-11-16 NOTE — Progress Notes (Signed)
Patient ID: Chad Brown                 DOB: 1941/04/09                    MRN: [id number]     HPI: Chad Brown is a 79 y.o. male patient referred to lipid clinic by Dr Aundra Dubin. PMH is significant for PAF s/p ablation Feb 2021, HFrEF due to nonischemic cardiomyopathy with LVEF of 45-50% in Feb 2021, CKD, nonobstructive CAD and PAD. Chest CT 07/2019 showed aortic atherosclerosis and left main and LAD coronary artery calcifications.  Patient presented today for a first time visit with the pharmacy clinic for lipid management. He last met with Dr. Aundra Dubin on 2/11 where initiation of Repatha was discussed and losartan was changed to [facility] 24/26. He has a good understanding of his risk factors and the need for reducing cholesterol and LDL. He reports he tolerated atorvastatin for ~10 years prior to feeling like his legs hurt and he couldn't walk, he has not tolerated other statins since then including pravastatin, simvastatin, and pitavastatin. He is tolerating ezetimibe well and reports no myalgias or leg pain with it. He states he was started on enzyme Co-Q10 after he had been on ezetimibe because it could help with myalgias, though he reports no difference when he takes this. He is taking niacin as well with an unclear indication. He is interested in using a PCSK-9 inhibitor. He has [employer] which prefers Praluent for a $47/month co-pay that patient needs assistance with. His yearly income is ~44,400 in a 2 person household.   He started [facility] Friday morning and unfortunately he had a fall on Saturday that resulted in an injury to both his face and hand. He reported his BP on Saturday to be 85/58. He had just picked up a 90 day supply of losartan but discontinued it when he started [place]. Of note, he is on Xarelto for stroke prevention due to his a fib. He has not taken his Entresto since Saturday and has not taken any BP meds today. BP in clinic was 115/76.  Current  Medications: ezetimibe 31m daily  Intolerances: myalgias with atorvastatin, simvastatin pravastatin 456mdaily, and Livalo 31m94mnd 4mg3mily Risk Factors: nonobstructive CAD, PAD, age LDL goal: <70mg8m Diet: egg cheese sandwich on bun, skips lunch will snack on rice krispies, neutragrain bars, dinner is usually meat or beans and vegetables and eats out ~3x /week and gets soup and a sandwich, pizza occasionaly  Exercise: was going to gym about 1-1.5 hours MWF but has recently stopped. Would like to resume.   Family History: Mother with hx of MI  Social History: Former tobacco abuse, quit in 2001.   Labs: 10/27/19: TC 167, TG 100, HDL 32, LDL 115 (ezetimibe 10mg 531my)  Past Medical History:  Diagnosis Date  . Adenomatous colon polyp    tubular  . AICD (automatic cardioverter/defibrillator) present 03/08/2015   MDT CRTD   . Anemia    iron deficient  . Arthritis    "about all my joints; hands, knees, back" (03/08/2015)  . Atherosclerosis   . Cataract    left eye small  . Cholelithiasis    gallstones  . Chronic systolic CHF (congestive heart failure) (HCC)  [place]. New dx 12/2012 ? NICM, may be r/t afib. b. Nuc 03/2013 - normal;  c. 03/2015 TEE EF 15-20%.  . Colon polyp, hyperplastic 5/16   removed precancerous lesions  .  Depression   . Diverticulosis   . Glaucoma    right eye  . Hyperlipidemia   . Hypertension   . Melanoma of eye ([place]) 2000's   "right; it's never been biopsied"  . Melanoma of lower leg ([place]) 2015   "left; right at my knee"  . Myocardial infarction ([place]) 1998  . OSA (obstructive sleep apnea) 01/04/2016   uses cpap, pt does not know settings auto set  . Peripheral vision loss, right 2006  . Persistent atrial fibrillation ([place])    a. Dx 12/2012, s/p TEE/DCCV 01/26/13. b. On Xarelto (CHA2DS2VASc = 3);  c. 03/2015 TEE (EF 15-20%, no LAA thrombus) and DCCV - amio increased to 200 mg bid.  . Urinary hesitancy due to benign prostatic hypertrophy     Current Outpatient  Medications on File Prior to Visit  Medication Sig Dispense Refill  . amiodarone (PACERONE) 200 MG tablet Take 1 tablet (200 mg total) by mouth daily. 30 tablet 5  . carvedilol (COREG) 12.5 MG tablet Take 1 tablet (12.5 mg total) by mouth 2 (two) times daily with a meal. 180 tablet 0  . Coenzyme Q10 (CO Q-10) 200 MG CAPS Take 200 mg by mouth daily.    . dorzolamide-timolol (COSOPT) 22.3-6.8 MG/ML ophthalmic solution Place 1 drop into the right eye 2 (two) times daily.    [name] escitalopram (LEXAPRO) 20 MG tablet Take 1 tablet (20 mg total) by mouth daily. 90 tablet 0  . ezetimibe (ZETIA) 10 MG tablet Take 1 tablet (10 mg total) by mouth daily. 90 tablet 2  . ferrous sulfate 325 (65 FE) MG tablet Take 325 mg by mouth daily with breakfast.     . finasteride (PROSCAR) 5 MG tablet Take 1 tablet (5 mg total) by mouth daily. 30 tablet 2  . furosemide (LASIX) 40 MG tablet Take 1.5 tablets (60 mg total) twice a day 270 tablet 3  . gabapentin (NEURONTIN) 100 MG capsule Take 200 mg by mouth 2 (two) times daily.     [name] latanoprost (XALATAN) 0.005 % ophthalmic solution Place 1 drop into the right eye at bedtime.     Mckinley Jewel Dimesylate (RHOPRESSA) 0.02 % SOLN Place 1 drop into the right eye at bedtime.     . niacin (NIASPAN) 1000 MG CR tablet Take 1,000 mg by mouth at bedtime.     . nitroGLYCERIN (NITROSTAT) 0.4 MG SL tablet Place 0.4 mg under the tongue every 5 (five) minutes as needed for chest pain.     . pantoprazole (PROTONIX) 40 MG tablet Take 1 tablet (40 mg total) by mouth daily. 45 tablet 0  . potassium chloride (KLOR-CON) 10 MEQ tablet Take 2 tablets (20 mEq total) by mouth daily. 60 tablet 5  . sacubitril-valsartan (ENTRESTO) 24-26 MG Take 1 tablet by mouth 2 (two) times daily. 60 tablet 5  . spironolactone (ALDACTONE) 25 MG tablet TAKE (1/2) TABLET DAILY. 90 tablet 3  . tamsulosin (FLOMAX) 0.4 MG CAPS Take 0.4 mg by mouth at bedtime.     Alveda Reasons 20 MG TABS tablet TAKE 1 TABLET ONCE DAILY WITH  SUPPER 90 tablet 0   No current facility-administered medications on file prior to visit.    Allergies  Allergen Reactions  . Pravastatin Sodium Other (See Comments)    Joint and muscle pain  . Azithromycin Diarrhea    Assessment/Plan:   1. Hyperlipidemia - Patient with uncontrolled hyperlipidemia on ezetimibe 10 mg daily. Most recent LDL was 115 in 1/21, his LDL goal is <  70 due to history of CAD and PAD. He was educated on administration technique, benefits, side effects and storage of Praulent 52m Q2W (preferred on formulary, Repatha not covered). Prior authorization submitted and approved, [employer] grant application submitted and approved for $0 copay. He will continue ezetimibe 10 mg daily for now. Will stop niacin due to lack of indication/benefit as well as CoQ10 since pt is not on statin therapy and previously tolerated ezetimibe without CoQ10. Will follow up lipids in 2-3 months - plan to check at his next appt with Dr MAundra Dubin   2. Heart failure/Blood pressure - Patient with symptomatic orthostatic hypotension resulting in a fall at home after 2 doses of Entresto 24-252mBID which was initiated at 11/12/19 visit. Concern raised with too low of blood pressure since he is on anticoagulation, though [facility]. Will reach out to Dr. McAundra Dubino assess if he could take 1/2 of Entresto 24/2677mID or if he could resume losartan 26m79mily.  Pt seen with AndrEddie CandleY1 pharmacy resident  MegaRamosple, PharmD, BCACP, CPP [id number] Streetee[place]ter[place] [id number]: ([phone]: ([phone]/2021 11:37 AM

## 2019-11-16 NOTE — Patient Instructions (Addendum)
It was nice to meet you today!   Your LDL cholesterol is 115 and your goal is less than 70  Continue taking ezetimibe (Zetia)  STOP taking niacin and CoQ10  We will submit information to your insurance to see if they will cover Praluent injections. These are stored in the fridge and are given twice monthly in the fatty tissue of your stomach  We will also apply for a grant to the [facility] to see if we can start you on this medicine for free  We will send a message to Dr Aundra Dubin to see if we can change your Entresto back to losartan  Call Jinny Blossom, Pharmacist with any questions [phone]

## 2019-11-17 ENCOUNTER — Telehealth (HOSPITAL_COMMUNITY): Payer: Self-pay | Admitting: Pharmacy Technician

## 2019-11-17 ENCOUNTER — Telehealth (HOSPITAL_COMMUNITY): Payer: Self-pay

## 2019-11-17 MED ORDER — SACUBITRIL-VALSARTAN 24-26 MG PO TABS: ORAL_TABLET | ORAL | 5 refills | Status: DC

## 2019-11-17 NOTE — Telephone Encounter (Signed)
-----   Message from Larey Dresser, MD sent at 11/16/2019  9:57 PM EST ----- Linus Orn, please have him cut Entresto in half, take half in am and half in pm.  THanks. ----- Message ----- From: Leeroy Bock, [place]: 11/16/2019  11:39 AM EST To: Larey Dresser, MD  Please see note from today - pt sent to lipid clinic for PCSK9i initiation. He mentioned having a fall over the weekend after taking 2 Entresto doses, BP was 85/58. Thoughts on either changing back to losartan 25mg  or potentially cutting Entresto in half even though this isn't typically recommended? Thanks!

## 2019-11-17 NOTE — Telephone Encounter (Signed)
Received a message stating patient was having a hard time affording his co-pays on Entresto and Xarelto. Upon researching, found out co-pays for both medications was $40.  Called and spoke with patient. Was able to sign him up for a PAN grant that will last him through the year on Entresto. Provided patient's pharmacy with billing information.   Member ID: [id number] Group ID: [id number] RxBin ID: [id number] PCN: [id number] Eligibility Start Date: 08/19/2019 Eligibility End Date: 11/15/2020 Assistance Amount: $1,000.00  Will mail patient Xarelto patient assistance application. He is going to sign and include POI.  Will follow up.  Charlann Boxer, CPhT

## 2019-11-17 NOTE — Telephone Encounter (Signed)
Per MD, pt to cut entreso 24/26mg  in half twice a day. Pt verbalized understanding. Advised to monitor blood pressure and let us know if BP continues to drop.  BP today 110/75 and similar yesterday. Per Dr Aundra Dubin, ok to continue medication as indicated. Pt verbalized understanding.

## 2019-11-19 ENCOUNTER — Ambulatory Visit (INDEPENDENT_AMBULATORY_CARE_PROVIDER_SITE_OTHER): Payer: Medicare PPO

## 2019-11-19 DIAGNOSIS — Z95810 Presence of automatic (implantable) cardiac defibrillator: Secondary | ICD-10-CM

## 2019-11-19 DIAGNOSIS — I5022 Chronic systolic (congestive) heart failure: Secondary | ICD-10-CM

## 2019-11-20 NOTE — Progress Notes (Signed)
PCP: [facility] [facility] Cardiologist: Aundra Dubin  HPI:  79 y.o. with paroxysmal atrial fibrillation and chronic systolic CHF thought to be due to nonischemic cardiomyopathy presents for followup of CHF. He has a cardiac history dating back to 1998, when he was admitted with chest pain and had cardiac cath showing nonobstructive CAD. In 4/14, he was found to be in atrial fibrillation.  Echo showed EF 30-35%.  He had DCCV back to NSR.  He was back in atrial fibrillation in 1/16, and EF was low again on TEE at that time.   Again, he had DCCV.  In 5/16, cardiac MRI showed persistently low EF, so given LBBB, he had Medtronic CRT-D device placed.  He was back in atrial fibrillation in 6/16 and had TEE-guided DCCV again with EF now 15-20% on TEE.  Repeat echo in 2/17 showed EF 55-60%. Echo in 1/19 showed EF 45-50%, mild LV and RV dilation.   Echo in 1/20 showed EF 45-50%, diffuse hypokinesis.   Atrial fibrillation ablation was done in 2/21.  Echo was done today and reviewed, EF 40-45%, PASP 50 mmHg, mild MR.   Recently returned to HF Clinic on 11/12/19 with Dr. Aundra Dubin. Patient was doing better than at last appointment in terms of CHF.  Weight was down 6 lbs, he was taking Lasix 60 mg bid. REDS clip still 43% but was 50% at last appointment.  He was short of breath after walking about 200 yards or walking up a hill.  Breathing was overall better.  No chest pain.  No orthopnea/PND.   Today he returns to HF clinic for pharmacist medication titration. At last visit with MD, losartan 50 mg BID was changed to Entresto 24/26 mg BID. However, this was decreased to Entresto 24/26 mg 1/2 tablet BID due to hypotension. He has been taking 1/2 tablet nightly as his systolic blood pressures were ~90 mmHg at home when taking Entresto in the AM. Overall he is feeling well today since he decreased his Entresto - no more falls noted and less dizziness. BP is 120/60 in clinic. He does still become lightheaded when standing. Although  this is a long standing finding for him he noted it has become worse lately. He gets SOB when walking uphill and with moderate activity. He weighs himself daily at home. Weight has been stable at 209-210 lbs recently. Weight in clinic is 214 lbs, which is down 5 lbs from last visit. He takes furosemide 40 mg AM/20 mg PM. No LEE, PND or orthopnea. His appetite is good. He has not been following a low sodium diet but he has stopped adding salt to foods.   HF Medications: Carvedilol 12.5 mg BID Entresto 24/26 mg -reported taking 1/2 tablet nightly Spironolactone 12.5 mg daily Furosemide 40 mg AM/20 mg PM Potassium Chloride 20 mEq daily  Has the patient been experiencing any side effects to the medications prescribed?  Yes - dizziness and hypotension with Entresto. Reported falling once. Has felt better since decreasing his dose to 1/2 tablet nightly.   Does the patient have any problems obtaining medications due to transportation or finances?   Yes - Has Medicare Part D. Uses PAN grant for [employer] and [employer] for [employer]. Xarelto patient assistance is pending.   Understanding of regimen: good Understanding of indications: good Potential of compliance: good Patient understands to avoid NSAIDs. Patient understands to avoid decongestants.    Pertinent Lab Values: . Serum creatinine 2.23, BUN 32, Potassium 4.4, Sodium 136  Vital Signs: . Weight: 214.4  lbs (last clinic weight: 219.4) . Blood pressure: 120/60  . Heart rate: 73   Assessment: 1. Chronic systolic CHF: Suspect nonischemic cardiomyopathy.  EF 15-20% on 6/16 TEE.  Cardiac MRI in 5/16 showed a LGE pattern that was not suggestive of coronary disease (?myocarditis or infiltrative disease).  There may be a component of tachycardia-mediated cardiomyopathy.  However, the cardiac MRI appears to have been done when he was in NSR.  In 9/16, echo showed EF increased to 40%.  Echo in 1/20 showed EF 45-50%.  He has Medtronic CRT-D  device.  Echo (2/21) with EF 40-45%.  - Euvolemic on exam, weight down 4 lbs since last visit.  Likely dry as Scr increased from 1.82 to 2.23.   -After discussion with Dr. Aundra Dubin, will decrease furosemide to 40 mg daily. Repeat BMET in 1 week. - Continue carvedilol 12.5 mg BID - Continue Entresto 24/26 mg  - reported taking 1/2 tablet nightly as experienced multiple hypotensive episodes with higher doses. Consider increasing back to 1/2 tablet BID at next visit as he adjusts to the medication.  -Continue spironolactone 12.5 mg daily.  2. Atrial fibrillation: Poorly tolerated.  Now s/p atrial fibrillation ablation in 2/21.  He is a-paced today.  - Continue Xarelto 20 mg daily.   - Continue amiodarone 200 mg daily. LFTs and TSH normal recently.  He should have regular eye exams.   3. Hyperlipidemia:  -Would like to see LDL < 70 with PAD, recently LDL was 115.  -Continue Zetia 10 mg daily and Repatha. Cannot tolerate statins. Now followed at lipid clinic.  4. OSA: Continue CPAP.   5. PAD: Minimal claudication.      Plan: 1) Medication changes: Based on clinical presentation, vital signs and recent labs will Decrease furosemide to 40 mg daily. Repeat BMET in 1 week.  2) Follow-up: 3 weeks with Dr. Rush Farmer, PharmD, BCPS, [facility], CPP Heart Failure Clinic Pharmacist [phone]

## 2019-11-24 ENCOUNTER — Telehealth: Payer: Self-pay

## 2019-11-24 NOTE — Telephone Encounter (Signed)
Remote ICM transmission received.  Attempted call to patient regarding ICM remote transmission and left detailed message per DPR.  Advised to return call for any fluid symptoms or questions. Next ICM remote transmission scheduled 12/04/2019.

## 2019-11-24 NOTE — Progress Notes (Signed)
EPIC Encounter for ICM Monitoring  Patient Name: AMANI NODARSE is a 79 y.o. male Date: 11/24/2019 Primary Care Physican: Haywood Pao, MD Primary Cardiologist:Nishan/McLean Electrophysiologist: Audry Riles Pacing:99.9% 2/3/2021Weight: 213lbs  Attempted call to patient and unable to reach.  Left detailed message per DPR regarding transmission. Transmission reviewed.     Optivol thoracic impedancenormal.  Prescribed:Furosemide40 mg1.5tablets (60 mg total) twice a day.Potassium 10 mEq take 2 tabletsdaily.  Labs: 08/25/2019 Creatinine 1.62, BUN 18, Potassium 3.6, Sodium 137, GFR 40-46 07/27/2019 Creatinine 1.76, BUN 21, Potassium 4.7, Sodium 141, GFR 36-42 01/20/2019 Creatinine 1.76, BUN 25, Potassium 4.5, Sodium 139, GFR 36-42 10/22/2018 Creatinine 1.53, BUN 16, Potassium 4.4, Sodium 139, GFR 43-50  Recommendations:No changes and encouraged to call if experiencing any fluid symptoms.  Follow-up plan: ICM clinic phone appointment on3/01/2020. 91 day device clinic remote transmission 12/03/2019.Office visit with Dr Aundra Dubin on 12/24/2019.  Copy of ICM check sent to Dr.Allred.  3 month ICM trend: 11/19/2019    1 Year ICM trend:       Rosalene Billings, RN 11/24/2019 9:57 AM

## 2019-11-27 ENCOUNTER — Telehealth (HOSPITAL_COMMUNITY): Payer: Self-pay | Admitting: Pharmacist

## 2019-11-27 NOTE — Telephone Encounter (Signed)
Sent in Manufacturer's Assistance application to [employer] for Xarelto.    Application pending, will continue to follow.  Audry Riles, PharmD, BCPS, BCCP, CPP Heart Failure Clinic Pharmacist [phone]

## 2019-12-01 ENCOUNTER — Ambulatory Visit (HOSPITAL_COMMUNITY)
Admission: RE | Admit: 2019-12-01 | Discharge: 2019-12-01 | Disposition: A | Discharge status: Home / Self Care | Payer: Medicare PPO | Source: Ambulatory Visit | Attending: Cardiology | Admitting: Cardiology

## 2019-12-01 ENCOUNTER — Ambulatory Visit (HOSPITAL_BASED_OUTPATIENT_CLINIC_OR_DEPARTMENT_OTHER)
Admission: RE | Admit: 2019-12-01 | Discharge: 2019-12-01 | Disposition: A | Discharge status: Home / Self Care | Payer: Medicare PPO | Source: Ambulatory Visit | Attending: Physician Assistant | Admitting: Physician Assistant

## 2019-12-01 ENCOUNTER — Other Ambulatory Visit: Payer: Self-pay

## 2019-12-01 ENCOUNTER — Ambulatory Visit (HOSPITAL_COMMUNITY): Payer: Medicare PPO | Admitting: Physician Assistant

## 2019-12-01 VITALS — BP 120/60 | HR 73 | Ht 66.0 in | Wt 214.4 lb

## 2019-12-01 DIAGNOSIS — E669 Obesity, unspecified: Secondary | ICD-10-CM | POA: Diagnosis not present

## 2019-12-01 DIAGNOSIS — E785 Hyperlipidemia, unspecified: Secondary | ICD-10-CM | POA: Insufficient documentation

## 2019-12-01 DIAGNOSIS — Z7901 Long term (current) use of anticoagulants: Secondary | ICD-10-CM | POA: Insufficient documentation

## 2019-12-01 DIAGNOSIS — G4733 Obstructive sleep apnea (adult) (pediatric): Secondary | ICD-10-CM | POA: Diagnosis not present

## 2019-12-01 DIAGNOSIS — I5022 Chronic systolic (congestive) heart failure: Secondary | ICD-10-CM | POA: Insufficient documentation

## 2019-12-01 DIAGNOSIS — Z79899 Other long term (current) drug therapy: Secondary | ICD-10-CM | POA: Diagnosis not present

## 2019-12-01 DIAGNOSIS — Z6834 Body mass index (BMI) 34.0-34.9, adult: Secondary | ICD-10-CM | POA: Diagnosis not present

## 2019-12-01 DIAGNOSIS — N189 Chronic kidney disease, unspecified: Secondary | ICD-10-CM | POA: Diagnosis not present

## 2019-12-01 DIAGNOSIS — F329 Major depressive disorder, single episode, unspecified: Secondary | ICD-10-CM | POA: Insufficient documentation

## 2019-12-01 DIAGNOSIS — D509 Iron deficiency anemia, unspecified: Secondary | ICD-10-CM | POA: Insufficient documentation

## 2019-12-01 DIAGNOSIS — I130 Hypertensive heart and chronic kidney disease with heart failure and stage 1 through stage 4 chronic kidney disease, or unspecified chronic kidney disease: Secondary | ICD-10-CM | POA: Diagnosis not present

## 2019-12-01 DIAGNOSIS — Z87891 Personal history of nicotine dependence: Secondary | ICD-10-CM | POA: Diagnosis not present

## 2019-12-01 DIAGNOSIS — I252 Old myocardial infarction: Secondary | ICD-10-CM | POA: Diagnosis not present

## 2019-12-01 DIAGNOSIS — I4819 Other persistent atrial fibrillation: Secondary | ICD-10-CM

## 2019-12-01 DIAGNOSIS — D6869 Other thrombophilia: Secondary | ICD-10-CM | POA: Insufficient documentation

## 2019-12-01 LAB — BASIC METABOLIC PANEL
Anion gap: 10 (ref 5–15)
BUN: 32 mg/dL — ABNORMAL HIGH (ref 8–23)
CO2: 28 mmol/L (ref 22–32)
Calcium: 8.4 mg/dL — ABNORMAL LOW (ref 8.9–10.3)
Chloride: 98 mmol/L (ref 98–111)
Creatinine, Ser: 2.23 mg/dL — ABNORMAL HIGH (ref 0.61–1.24)
GFR calc Af Amer: 32 mL/min — ABNORMAL LOW (ref >60)
GFR calc non Af Amer: 27 mL/min — ABNORMAL LOW (ref >60)
Glucose, Bld: 106 mg/dL — ABNORMAL HIGH (ref 70–99)
Potassium: 4.4 mmol/L (ref 3.5–5.1)
Sodium: 136 mmol/L (ref 135–145)

## 2019-12-01 MED ORDER — SPIRONOLACTONE 25 MG PO TABS: 12.5000 mg | ORAL_TABLET | Freq: Every day | ORAL | 3 refills | Status: DC

## 2019-12-01 MED ORDER — SACUBITRIL-VALSARTAN 24-26 MG PO TABS: ORAL_TABLET | ORAL | 5 refills | Status: DC

## 2019-12-01 MED ORDER — SPIRONOLACTONE 25 MG PO TABS: 25.0000 mg | ORAL_TABLET | Freq: Every day | ORAL | 3 refills | Status: DC

## 2019-12-01 MED ORDER — FUROSEMIDE 40 MG PO TABS: ORAL_TABLET | ORAL | 3 refills | Status: DC

## 2019-12-01 NOTE — Progress Notes (Signed)
Primary Care Physician: Haywood Pao, MD Primary Cardiologist: Johnsie Cancel [facility]: Dr Aundra Dubin Primary Electrophysiologist: Dr Rayann Heman Referring Physician: Dr Rogers Blocker is a 79 y.o. male with a history of persistent atrial fibrillation, systolic CHF, CKD, OSA, HLD, who presents for follow up in the [facility].  The patient was initially diagnosed with atrial fibrillation 12/2012. Patient is on Xarelto for a CHADS2VASC score of 4. Echo showed EF 30-35%.  He had DCCV back to NSR.  He was back in atrial fibrillation in 1/16, and EF was low again on TEE at that time and he had DCCV.  In 5/16, cardiac MRI showed persistently low EF, so given LBBB, he had Medtronic CRT-D device placed.  He was back in atrial fibrillation in 6/16 and had TEE-guided DCCV. Patient noted to be back in afib 10/2019 despite amiodarone therapy. He underwent afib ablation with Dr Rayann Heman on 11/03/19. Patient reports that he has done well since the procedure. His weight has been stable and no lower extremity edema. Patient does report that he fell about 3 weeks ago and hit the side of his face on the trash can. He did not seek evaluation for this. No presyncope, loss of consciousness, or residual symptoms. He denies chest pain, swallowing, or groin issues.  Today, he denies symptoms of palpitations, chest pain, shortness of breath, orthopnea, PND, lower extremity edema, presyncope, syncope, snoring, daytime somnolence, bleeding, or neurologic sequela. The patient is tolerating medications without difficulties and is otherwise without complaint today.    Atrial Fibrillation Risk Factors:  he does have symptoms or diagnosis of sleep apnea. he is compliant with CPAP therapy. he does not have a history of rheumatic fever.  he has a BMI of Body mass index is 34.61 kg/m.[name] Filed Weights   12/01/19 1400  Weight: 97.3 kg    Family History  Problem Relation Age of Onset  . Kidney disease  Mother   . Heart disease Mother        MI, open heart  . Diabetes Mother        dialysis  . Leukemia Father   . Colon cancer Paternal Uncle   . Lung cancer Paternal Uncle        x 2  . Prostate cancer Paternal Uncle   . Diabetes Maternal Grandmother   . Heart attack Maternal Uncle   . Diabetes Maternal Aunt        x 3  . Diabetes Maternal Uncle      Atrial Fibrillation Management history:  Previous antiarrhythmic drugs: amiodarone Previous cardioversions: several, most recently 2018 Previous ablations: 11/03/19 CHADS2VASC score: 4 Anticoagulation history: Xarelto   Past Medical History:  Diagnosis Date  . Adenomatous colon polyp    tubular  . AICD (automatic cardioverter/defibrillator) present 03/08/2015   MDT CRTD   . Anemia    iron deficient  . Arthritis    "about all my joints; hands, knees, back" (03/08/2015)  . Atherosclerosis   . Cataract    left eye small  . Cholelithiasis    gallstones  . Chronic systolic CHF (congestive heart failure) ([place])    a. New dx 12/2012 ? NICM, may be r/t afib. b. Nuc 03/2013 - normal;  c. 03/2015 TEE EF 15-20%.  . Colon polyp, hyperplastic 5/16   removed precancerous lesions  . Depression   . Diverticulosis   . Glaucoma    right eye  . Hyperlipidemia   . Hypertension   .  Melanoma of eye ([place]) 2000's   "right; it's never been biopsied"  . Melanoma of lower leg ([place]) 2015   "left; right at my knee"  . Myocardial infarction ([place]) 1998  . OSA (obstructive sleep apnea) 01/04/2016   uses cpap, pt does not know settings auto set  . Peripheral vision loss, right 2006  . Persistent atrial fibrillation ([place])    a. Dx 12/2012, s/p TEE/DCCV 01/26/13. b. On Xarelto (CHA2DS2VASc = 3);  c. 03/2015 TEE (EF 15-20%, no LAA thrombus) and DCCV - amio increased to 200 mg bid.  . Urinary hesitancy due to benign prostatic hypertrophy    Past Surgical History:  Procedure Laterality Date  . ATRIAL FIBRILLATION ABLATION N/A 11/03/2019   Procedure: ATRIAL  FIBRILLATION ABLATION;  Surgeon: Thompson Grayer, MD;  Location: [facility];  Service: Cardiovascular;  Laterality: N/A;  . BACK SURGERY     upper back, cannot turn neck well  . CARDIAC CATHETERIZATION  1998  . CARDIOVERSION N/A 01/26/2013   Procedure: CARDIOVERSION;  Surgeon: Lelon Perla, MD;  Location: [facility] ENDOSCOPY;  Service: Cardiovascular;  Laterality: N/A;  . CARDIOVERSION N/A 03/23/2015   Procedure: CARDIOVERSION;  Surgeon: Jerline Pain, MD;  Location: [facility] ENDOSCOPY;  Service: Cardiovascular;  Laterality: N/A;  . CARDIOVERSION N/A 08/14/2017   Procedure: CARDIOVERSION;  Surgeon: Josue Hector, MD;  Location: [facility];  Service: Cardiovascular;  Laterality: N/A;  . CATARACT EXTRACTION Right ~ 2006  . COLONOSCOPY WITH PROPOFOL N/A 02/10/2015   Procedure: COLONOSCOPY WITH PROPOFOL;  Surgeon: Gatha Mayer, MD;  Location: [facility] ENDOSCOPY;  Service: Endoscopy;  Laterality: N/A;  . COLONOSCOPY WITH PROPOFOL N/A 08/07/2016   Procedure: COLONOSCOPY WITH PROPOFOL;  Surgeon: Gatha Mayer, MD;  Location: [facility] ENDOSCOPY;  Service: Endoscopy;  Laterality: N/A;  . ENTEROSCOPY N/A 08/17/2015   Procedure: ENTEROSCOPY;  Surgeon: Gatha Mayer, MD;  Location: [facility] ENDOSCOPY;  Service: Endoscopy;  Laterality: N/A;  . EP IMPLANTABLE DEVICE N/A 03/08/2015   MDT Hillery Aldo CRT-D for nonischemic CM by Dr Rayann Heman for primary prevention  . GLAUCOMA SURGERY Right ~ 2006   "put 3 stents in to drain fluid" (03/08/2015) not successful, sent to [facility] to try to get last stent out  . HOT HEMOSTASIS N/A 08/07/2016   Procedure: HOT HEMOSTASIS (ARGON PLASMA COAGULATION/BICAP);  Surgeon: Gatha Mayer, MD;  Location: [facility];  Service: Endoscopy;  Laterality: N/A;  . INCISION AND DRAINAGE ABSCESS POSTERIOR CERVICALSPINE  05/2012  . JOINT REPLACEMENT    . MELANOMA EXCISION Left 2015   "lower leg; right at my knee"  . REFRACTIVE SURGERY Right ~ 2006 X 2   "twice; both done at [place]" (03/08/2015  . SURGERY SCROTAL /  TESTICULAR Right 1990's  . TEE WITHOUT CARDIOVERSION N/A 01/26/2013   Procedure: TRANSESOPHAGEAL ECHOCARDIOGRAM (TEE);  Surgeon: Lelon Perla, MD;  Location: [facility];  Service: Cardiovascular;  Laterality: N/A;  [name] anes. /   . TEE WITHOUT CARDIOVERSION N/A 10/05/2014   Procedure: TRANSESOPHAGEAL ECHOCARDIOGRAM (TEE)  with cardioversion;  Surgeon: Thayer Headings, MD;  Location: [facility] ENDOSCOPY;  Service: Cardiovascular;  Laterality: N/A;  12:52 synched cardioversion at 120 joules,...afib to SR...12 lead EKG ordered.Marland KitchenMarland KitchenCardiozem d/c'ed per MD verbal order at SR  . TEE WITHOUT CARDIOVERSION N/A 03/23/2015   Procedure: TRANSESOPHAGEAL ECHOCARDIOGRAM (TEE);  Surgeon: Jerline Pain, MD;  Location: [facility];  Service: Cardiovascular;  Laterality: N/A;  . TEE WITHOUT CARDIOVERSION N/A 11/02/2019   Procedure: TRANSESOPHAGEAL ECHOCARDIOGRAM (TEE);  Surgeon: [name]  C, MD;  Location: [facility];  Service: Cardiovascular;  Laterality: N/A;  . THORACIC SPINE SURGERY  03/2000   "ground calcium deposits from upper thoracic" (01/26/2013)  . TOTAL HIP ARTHROPLASTY Right 06/2007    Current Outpatient Medications  Medication Sig Dispense Refill  . Alirocumab (PRALUENT) 75 MG/ML SOAJ Inject 1 pen into the skin every 14 (fourteen) days. 2 pen 11  . amiodarone (PACERONE) 200 MG tablet Take 1 tablet (200 mg total) by mouth daily. 30 tablet 5  . carvedilol (COREG) 12.5 MG tablet Take 1 tablet (12.5 mg total) by mouth 2 (two) times daily with a meal. 180 tablet 0  . dorzolamide-timolol (COSOPT) 22.3-6.8 MG/ML ophthalmic solution Place 1 drop into the right eye 2 (two) times daily.    [name] escitalopram (LEXAPRO) 20 MG tablet Take 1 tablet (20 mg total) by mouth daily. 90 tablet 0  . ferrous sulfate 325 (65 FE) MG tablet Take 325 mg by mouth daily with breakfast.     . finasteride (PROSCAR) 5 MG tablet Take 1 tablet (5 mg total) by mouth daily. 30 tablet 2  . furosemide (LASIX) 40 MG tablet Take 1.5 tablets (60  mg total) twice a day 270 tablet 3  . gabapentin (NEURONTIN) 100 MG capsule Take 200 mg by mouth 2 (two) times daily.     [name] latanoprost (XALATAN) 0.005 % ophthalmic solution Place 1 drop into the right eye at bedtime.     Mckinley Jewel Dimesylate (RHOPRESSA) 0.02 % SOLN Place 1 drop into the right eye at bedtime.     . nitroGLYCERIN (NITROSTAT) 0.4 MG SL tablet Place 0.4 mg under the tongue every 5 (five) minutes as needed for chest pain.     . pantoprazole (PROTONIX) 40 MG tablet Take 1 tablet (40 mg total) by mouth daily. 45 tablet 0  . potassium chloride (KLOR-CON) 10 MEQ tablet Take 2 tablets (20 mEq total) by mouth daily. 60 tablet 5  . sacubitril-valsartan (ENTRESTO) 24-26 MG Taking 1/2 tablet by mouth at bedtime 60 tablet 5  . spironolactone (ALDACTONE) 25 MG tablet TAKE (1/2) TABLET DAILY. 90 tablet 3  . tamsulosin (FLOMAX) 0.4 MG CAPS Take 0.4 mg by mouth at bedtime.     Alveda Reasons 20 MG TABS tablet TAKE 1 TABLET ONCE DAILY WITH SUPPER 90 tablet 0   No current facility-administered medications for this encounter.    Allergies  Allergen Reactions  . Pravastatin Sodium Other (See Comments)    Joint and muscle pain  . Azithromycin Diarrhea    Social History   Socioeconomic History  . Marital status: Married    Spouse name: Not on file  . Number of children: 3  . Years of education: Not on file  . Highest education level: Not on file  Occupational History  . Occupation: retired  Tobacco Use  . Smoking status: Former Smoker    Packs/day: 3.00    Years: 48.00    Pack years: 144.00    Types: Cigarettes    Quit date: 09/28/2000    Years since quitting: 19.1  . Smokeless tobacco: Never Used  Substance and Sexual Activity  . Alcohol use: No  . Drug use: No  . Sexual activity: Yes  Other Topics Concern  . Not on file  Social History Narrative   Pt lives in [place] with spouse.  3 children are grown and healthy.   Retired.  Ran a country store for 30 years,  previously worked in [employer] for 16  years   Social Determinants of Health   Financial Resource Strain:   . Difficulty of Paying Living Expenses: Not on file  Food Insecurity:   . Worried About Charity fundraiser in the Last Year: Not on file  . Ran Out of Food in the Last Year: Not on file  Transportation Needs:   . Lack of Transportation (Medical): Not on file  . Lack of Transportation (Non-Medical): Not on file  Physical Activity:   . Days of Exercise per Week: Not on file  . Minutes of Exercise per Session: Not on file  Stress:   . Feeling of Stress : Not on file  Social Connections:   . Frequency of Communication with Friends and Family: Not on file  . Frequency of Social Gatherings with Friends and Family: Not on file  . Attends Religious Services: Not on file  . Active Member of Clubs or Organizations: Not on file  . Attends Archivist Meetings: Not on file  . Marital Status: Not on file  Intimate Partner Violence:   . Fear of Current or Ex-Partner: Not on file  . Emotionally Abused: Not on file  . Physically Abused: Not on file  . Sexually Abused: Not on file     ROS- All systems are reviewed and negative except as per the HPI above.  Physical Exam: Vitals:   12/01/19 1400  BP: 120/60  Pulse: 73  Weight: 97.3 kg  Height: 5\' 6"  (1.676 m)    GEN- The patient is well appearing obese elderly male, alert and oriented x 3 today.   Head- normocephalic, atraumatic Eyes-  Sclera clear, conjunctiva pink Ears- hearing intact Oropharynx- clear Neck- supple  Lungs- Clear to ausculation bilaterally, normal work of breathing Heart- Regular rate and rhythm, no murmurs, rubs or gallops  GI- soft, NT, ND, + BS Extremities- no clubbing, cyanosis, or edema MS- no significant deformity or atrophy Skin- no rash or lesion Psych- euthymic mood, full affect Neuro- strength and sensation are intact  Wt Readings from Last 3 Encounters:  12/01/19 97.3 kg   11/12/19 99.5 kg  11/03/19 97.5 kg    EKG today demonstrates AV dual paced rhythm HR 73, PR 126, QRS 192, QTc 533  Echo 11/12/19 demonstrated  1. Left ventricular ejection fraction, by estimation, is 40 to 45%. The  left ventricle has mildly decreased function. The left ventrical  demonstrates global hypokinesis. The left ventricular internal cavity size  was mildly dilated. Left ventricular  diastolic parameters are consistent with Grade II diastolic dysfunction  (pseudonormalization). GLS -9.1%, abnormal.  2. Right ventricular systolic function is normal. The right ventricular  size is normal. There is moderately elevated pulmonary artery systolic  pressure. The estimated right ventricular systolic pressure is 37.1 mmHg.  3. Left atrial size was mild to moderately dilated. No left atrial/left  atrial appendage thrombus was detected.  4. The mitral valve is normal in structure and function. Mild mitral  valve regurgitation. No evidence of mitral stenosis.  5. The aortic valve is tricuspid. Aortic valve regurgitation is trivial .  No aortic stenosis is present.  6. The inferior vena cava is normal in size with greater than 50%  respiratory variability, suggesting right atrial pressure of 3 mmHg.   Epic records are reviewed at length today  CHA2DS2-VASc Score = 4 The patient's score is based upon: CHF History: Yes HTN History: Yes Age : 64 + Diabetes History: No Stroke History: No Vascular Disease History: No Gender: Male  ASSESSMENT AND PLAN: 1. Persistent Atrial Fibrillation (ICD10:  I48.19) The patient's CHA2DS2-VASc score is 4, indicating a 4.8% annual risk of stroke.   S/p afib ablation 11/03/19. Patient appears to be maintaining SR. Continue amiodarone 200 mg daily. Continue Xarelto 20 mg daily with no missed doses for at least 3 months post ablation. Discussed anticoagulation precautions again with patient that if he should hit his head he needs to have a  head scan to evaluate for bleeding. Patient voiced understanding. Continue Coreg 12.5 mg BID  2. Secondary Hypercoagulable State (ICD10:  D68.69) The patient is at significant risk for stroke/thromboembolism based upon his CHA2DS2-VASc Score of 4.  Continue Rivaroxaban (Xarelto).   3. Obesity Body mass index is 34.61 kg/m. Lifestyle modification was discussed at length including regular exercise and weight reduction.  4. Obstructive sleep apnea The importance of adequate treatment of sleep apnea was discussed today in order to improve our ability to maintain sinus rhythm long term. Patient reports compliance with CPAP therapy.  5. Chronic systolic CHF No signs or symptoms of fluid overload today. Weight stable. Followed by Dr Aundra Dubin.   Follow up with Dr Aundra Dubin and Dr Rayann Heman as scheduled.    [facility] [address] [place], [place] [address] [phone] 12/01/2019 2:20 PM

## 2019-12-01 NOTE — Patient Instructions (Addendum)
It was a pleasure seeing you today!  MEDICATIONS: -We are changing your medications today -Decrease furosemide to 40 mg daily.  -Call if you have questions about your medications.  LABS:  -We will call you if your labs need attention.  NEXT APPOINTMENT: Return to clinic in 3 weeks with Dr. Aundra Dubin.  In general, to take care of your heart failure: -Limit your fluid intake to 2 Liters (half-gallon) per day.   -Limit your salt intake to ideally 2-3 grams (2000-3000 mg) per day. -Weigh yourself daily and record, and bring that "weight diary" to your next appointment.  (Weight gain of 2-3 pounds in 1 day typically means fluid weight.) -The medications for your heart are to help your heart and help you live longer.   -Please contact us before stopping any of your heart medications.  Call the clinic at [phone] with questions or to reschedule future appointments.

## 2019-12-02 ENCOUNTER — Telehealth (HOSPITAL_COMMUNITY): Payer: Self-pay | Admitting: Pharmacist

## 2019-12-02 NOTE — Telephone Encounter (Signed)
Patient was denied Patient Assistance for Xarelto through [employer] at this time because he has not spent 4% of his annual income on prescription drugs for 2021. Called [employer] and they informed me that he needs to spend $1000 on prescription drugs for 2021 before he will qualify. Patient is aware that once he and his wife combined spend $1000 on prescription drugs, they can submit a pharmacy out of pocket expense report to [employer] and the application will be reprocessed.  Audry Riles, PharmD, BCPS, BCCP, CPP Heart Failure Clinic Pharmacist [phone]

## 2019-12-03 ENCOUNTER — Ambulatory Visit (INDEPENDENT_AMBULATORY_CARE_PROVIDER_SITE_OTHER): Payer: Medicare PPO | Admitting: *Deleted

## 2019-12-03 DIAGNOSIS — I5022 Chronic systolic (congestive) heart failure: Secondary | ICD-10-CM

## 2019-12-03 LAB — CUP PACEART REMOTE DEVICE CHECK
Battery Remaining Longevity: 26 mo
Battery Voltage: 2.93 V
Brady Statistic AP VP Percent: 97.94 %
Brady Statistic AP VS Percent: 0.06 %
Brady Statistic AS VP Percent: 2 %
Brady Statistic AS VS Percent: 0 %
Brady Statistic RA Percent Paced: 97.99 %
Brady Statistic RV Percent Paced: 99.93 %
Date Time Interrogation Session: 20210304063526
HighPow Impedance: 76 Ohm
Implantable Lead Implant Date: 20160607
Implantable Lead Implant Date: 20160607
Implantable Lead Implant Date: 20160607
Implantable Lead Location: 753858
Implantable Lead Location: 753859
Implantable Lead Location: 753860
Implantable Lead Model: 4598
Implantable Lead Model: 5076
Implantable Pulse Generator Implant Date: 20160607
Lead Channel Impedance Value: 1216 Ohm
Lead Channel Impedance Value: 1235 Ohm
Lead Channel Impedance Value: 342 Ohm
Lead Channel Impedance Value: 380 Ohm
Lead Channel Impedance Value: 532 Ohm
Lead Channel Impedance Value: 589 Ohm
Lead Channel Impedance Value: 646 Ohm
Lead Channel Impedance Value: 665 Ohm
Lead Channel Impedance Value: 665 Ohm
Lead Channel Impedance Value: 855 Ohm
Lead Channel Impedance Value: 969 Ohm
Lead Channel Impedance Value: 969 Ohm
Lead Channel Impedance Value: 988 Ohm
Lead Channel Pacing Threshold Amplitude: 0.625 V
Lead Channel Pacing Threshold Amplitude: 0.75 V
Lead Channel Pacing Threshold Amplitude: 1.125 V
Lead Channel Pacing Threshold Pulse Width: 0.4 ms
Lead Channel Pacing Threshold Pulse Width: 0.4 ms
Lead Channel Pacing Threshold Pulse Width: 0.4 ms
Lead Channel Sensing Intrinsic Amplitude: 13.125 mV
Lead Channel Sensing Intrinsic Amplitude: 13.125 mV
Lead Channel Sensing Intrinsic Amplitude: 2.5 mV
Lead Channel Sensing Intrinsic Amplitude: 2.5 mV
Lead Channel Setting Pacing Amplitude: 1.5 V
Lead Channel Setting Pacing Amplitude: 2 V
Lead Channel Setting Pacing Amplitude: 2.75 V
Lead Channel Setting Pacing Pulse Width: 0.4 ms
Lead Channel Setting Pacing Pulse Width: 0.4 ms
Lead Channel Setting Sensing Sensitivity: 0.3 mV

## 2019-12-03 NOTE — Progress Notes (Signed)
ICD Remote  

## 2019-12-04 ENCOUNTER — Ambulatory Visit (INDEPENDENT_AMBULATORY_CARE_PROVIDER_SITE_OTHER): Payer: Medicare PPO

## 2019-12-04 ENCOUNTER — Telehealth: Payer: Self-pay

## 2019-12-04 DIAGNOSIS — I5022 Chronic systolic (congestive) heart failure: Secondary | ICD-10-CM | POA: Diagnosis not present

## 2019-12-04 DIAGNOSIS — Z95810 Presence of automatic (implantable) cardiac defibrillator: Secondary | ICD-10-CM | POA: Diagnosis not present

## 2019-12-04 NOTE — Telephone Encounter (Signed)
Remote ICM transmission received.  Attempted call to patient regarding ICM remote transmission and left detailed message per DPR.  Advised to return call for any fluid symptoms or questions.  

## 2019-12-04 NOTE — Progress Notes (Signed)
EPIC Encounter for ICM Monitoring  Patient Name: Chad Brown is a 79 y.o. male Date: 12/04/2019 Primary Care Physican: Haywood Pao, MD Primary Cardiologist:Nishan/McLean Electrophysiologist: Audry Riles Pacing:99.9% 2/3/2021Weight: 213lbs  Attempted call to patient and unable to reach.  Left detailed message per DPR regarding transmission. Transmission reviewed.   Optivol thoracic impedancenormal.  Prescribed:Furosemide40 mg1.5tablets (60 mg total) twice a day.Potassium 10 mEq take 2 tabletsdaily.  Labs: 12/01/2019 Creatinine 2.23, BUN 32, Potassium 4.4, Sodium 136, GFR 27-32 11/12/2019 Creatinine 1.82, BUN 16, Potassium 4.0, Sodium 139, GFR 35-40  10/27/2019 Creatinine 1.89, BUN 18, Potassium 3.8, Sodium 138, GFR 33-39  08/25/2019 Creatinine 1.62, BUN 18, Potassium 3.6, Sodium 137, GFR 40-46 07/27/2019 Creatinine 1.76, BUN 21, Potassium 4.7, Sodium 141, GFR 36-42 01/20/2019 Creatinine 1.76, BUN 25, Potassium 4.5, Sodium 139, GFR 36-42 10/22/2018 Creatinine 1.53, BUN 16, Potassium 4.4, Sodium 139, GFR 43-50 A complete set of results can be found in Results Review.  Recommendations:Left voice mail with ICM number and encouraged to call if experiencing any fluid symptoms.  Follow-up plan: ICM clinic phone appointment on4/01/2020. 91 day device clinic remote transmission 03/03/2020.Office visit with Dr Rayann Heman on 02/01/2020.  Copy of ICM check sent to Dr.Allred.  3 month ICM trend: 12/03/2019    1 Year ICM trend:       Rosalene Billings, RN 12/04/2019 12:05 PM

## 2019-12-09 ENCOUNTER — Telehealth: Payer: Self-pay | Admitting: *Deleted

## 2019-12-09 NOTE — Telephone Encounter (Signed)
Informed patient of compliance results and verbalized understanding was indicated. Patient is aware and agreeable to AHI being within range at 1.3.. Patient is aware and agreeable to improving in compliance with machine usage.

## 2019-12-09 NOTE — Telephone Encounter (Signed)
-----   Message from Sueanne Margarita, MD sent at 12/04/2019 10:49 AM EST ----- Good AHI on PAP but needs to improve compliance

## 2019-12-11 ENCOUNTER — Other Ambulatory Visit: Payer: Self-pay

## 2019-12-11 ENCOUNTER — Ambulatory Visit (HOSPITAL_COMMUNITY)
Admission: RE | Admit: 2019-12-11 | Discharge: 2019-12-11 | Disposition: A | Discharge status: Home / Self Care | Payer: Medicare PPO | Source: Ambulatory Visit | Attending: Internal Medicine | Admitting: Internal Medicine

## 2019-12-11 DIAGNOSIS — I5022 Chronic systolic (congestive) heart failure: Secondary | ICD-10-CM | POA: Diagnosis present

## 2019-12-11 LAB — BASIC METABOLIC PANEL
Anion gap: 9 (ref 5–15)
BUN: 27 mg/dL — ABNORMAL HIGH (ref 8–23)
CO2: 28 mmol/L (ref 22–32)
Calcium: 8.3 mg/dL — ABNORMAL LOW (ref 8.9–10.3)
Chloride: 103 mmol/L (ref 98–111)
Creatinine, Ser: 2.12 mg/dL — ABNORMAL HIGH (ref 0.61–1.24)
GFR calc Af Amer: 33 mL/min — ABNORMAL LOW (ref >60)
GFR calc non Af Amer: 29 mL/min — ABNORMAL LOW (ref >60)
Glucose, Bld: 105 mg/dL — ABNORMAL HIGH (ref 70–99)
Potassium: 4.7 mmol/L (ref 3.5–5.1)
Sodium: 140 mmol/L (ref 135–145)

## 2019-12-14 ENCOUNTER — Telehealth: Payer: Self-pay | Admitting: Pharmacist

## 2019-12-14 MED ORDER — REPATHA SURECLICK 140 MG/ML ~~LOC~~ SOAJ: 1.0000 "pen " | SUBCUTANEOUS | 11 refills | Status: DC

## 2019-12-14 NOTE — Telephone Encounter (Signed)
Patient made aware of below

## 2019-12-14 NOTE — Telephone Encounter (Signed)
Received notification from pharmacy that patients Praluent needed prior authorization. Went to submit for Praluent but appears Repatha is the preferred (Repatha is tier 2, praluent tier 3) Submitted prior authorization for Repatha- approved (states already on file).  Left message with wife for patient to call back

## 2019-12-23 ENCOUNTER — Telehealth (HOSPITAL_COMMUNITY): Payer: Self-pay

## 2019-12-23 NOTE — Telephone Encounter (Signed)

## 2019-12-24 ENCOUNTER — Ambulatory Visit (HOSPITAL_COMMUNITY)
Admission: RE | Admit: 2019-12-24 | Discharge: 2019-12-24 | Disposition: A | Discharge status: Home / Self Care | Payer: Medicare PPO | Source: Ambulatory Visit | Attending: Cardiology | Admitting: Cardiology

## 2019-12-24 ENCOUNTER — Other Ambulatory Visit: Payer: Self-pay

## 2019-12-24 ENCOUNTER — Encounter (HOSPITAL_COMMUNITY): Payer: Self-pay | Admitting: Cardiology

## 2019-12-24 VITALS — BP 124/82 | HR 76 | Wt 217.8 lb

## 2019-12-24 DIAGNOSIS — I428 Other cardiomyopathies: Secondary | ICD-10-CM | POA: Diagnosis not present

## 2019-12-24 DIAGNOSIS — Z96649 Presence of unspecified artificial hip joint: Secondary | ICD-10-CM | POA: Insufficient documentation

## 2019-12-24 DIAGNOSIS — Z85820 Personal history of malignant melanoma of skin: Secondary | ICD-10-CM | POA: Insufficient documentation

## 2019-12-24 DIAGNOSIS — I480 Paroxysmal atrial fibrillation: Secondary | ICD-10-CM | POA: Insufficient documentation

## 2019-12-24 DIAGNOSIS — Z87891 Personal history of nicotine dependence: Secondary | ICD-10-CM | POA: Diagnosis not present

## 2019-12-24 DIAGNOSIS — G4733 Obstructive sleep apnea (adult) (pediatric): Secondary | ICD-10-CM | POA: Diagnosis not present

## 2019-12-24 DIAGNOSIS — Z79899 Other long term (current) drug therapy: Secondary | ICD-10-CM | POA: Insufficient documentation

## 2019-12-24 DIAGNOSIS — E785 Hyperlipidemia, unspecified: Secondary | ICD-10-CM | POA: Insufficient documentation

## 2019-12-24 DIAGNOSIS — Z95810 Presence of automatic (implantable) cardiac defibrillator: Secondary | ICD-10-CM | POA: Insufficient documentation

## 2019-12-24 DIAGNOSIS — Z8249 Family history of ischemic heart disease and other diseases of the circulatory system: Secondary | ICD-10-CM | POA: Diagnosis not present

## 2019-12-24 DIAGNOSIS — Z7901 Long term (current) use of anticoagulants: Secondary | ICD-10-CM | POA: Diagnosis not present

## 2019-12-24 DIAGNOSIS — I739 Peripheral vascular disease, unspecified: Secondary | ICD-10-CM | POA: Insufficient documentation

## 2019-12-24 DIAGNOSIS — I5022 Chronic systolic (congestive) heart failure: Secondary | ICD-10-CM | POA: Insufficient documentation

## 2019-12-24 DIAGNOSIS — N183 Chronic kidney disease, stage 3 unspecified: Secondary | ICD-10-CM | POA: Diagnosis not present

## 2019-12-24 LAB — BASIC METABOLIC PANEL
Anion gap: 8 (ref 5–15)
BUN: 26 mg/dL — ABNORMAL HIGH (ref 8–23)
CO2: 28 mmol/L (ref 22–32)
Calcium: 8.2 mg/dL — ABNORMAL LOW (ref 8.9–10.3)
Chloride: 103 mmol/L (ref 98–111)
Creatinine, Ser: 2.32 mg/dL — ABNORMAL HIGH (ref 0.61–1.24)
GFR calc Af Amer: 30 mL/min — ABNORMAL LOW (ref >60)
GFR calc non Af Amer: 26 mL/min — ABNORMAL LOW (ref >60)
Glucose, Bld: 99 mg/dL (ref 70–99)
Potassium: 4.4 mmol/L (ref 3.5–5.1)
Sodium: 139 mmol/L (ref 135–145)

## 2019-12-24 MED ORDER — FUROSEMIDE 40 MG PO TABS: 40.0000 mg | ORAL_TABLET | Freq: Every day | ORAL | 5 refills | Status: DC

## 2019-12-24 MED ORDER — SACUBITRIL-VALSARTAN 24-26 MG PO TABS: ORAL_TABLET | ORAL | 5 refills | Status: DC

## 2019-12-24 NOTE — Patient Instructions (Addendum)
[facility]- [facility] [phone]  DECREASE Lasix to 40mg  (1 tab) daily  INCREASE Entresto to 1/2 tab TWICE A DAY  Labs today and repeat in 2 weeks We will only contact you if something comes back abnormal or we need to make some changes. Otherwise no news is good news!  April Garage Code- [id number]  Your physician recommends that you schedule a follow-up appointment in: 3-4 months with Dr Aundra Dubin. We will call you to schedule this appointment  Please call office at [phone] option 2 if you have any questions or concerns.   At the [facility], you and your health needs are our priority. As part of our continuing mission to provide you with exceptional heart care, we have created designated Provider Care Teams. These Care Teams include your primary Cardiologist (physician) and Advanced Practice Providers (APPs- Physician Assistants and Nurse Practitioners) who all work together to provide you with the care you need, when you need it.   You may see any of the following providers on your designated Care Team at your next follow up: [name] Dr Glori Bickers . Dr Loralie Champagne . Darrick Grinder, NP . Lyda Jester, PA . Audry Riles, PharmD   Please be sure to bring in all your medications bottles to every appointment.

## 2019-12-26 NOTE — Progress Notes (Signed)
Patient ID: Chad Brown, male   DOB: May 11, 1941, 79 y.o.   MRN: [id number] PCP: Edrick Oh HF Cardiologist: Aundra Dubin  79 y.o. with paroxysmal atrial fibrillation and chronic systolic CHF thought to be due to nonischemic cardiomyopathy presents for followup of CHF. He has a cardiac history dating back to 1998, when he was admitted with chest pain and had cardiac cath showing nonobstructive CAD. In 4/14, he was found to be in atrial fibrillation.  Echo showed EF 30-35%.  He had DCCV back to NSR.  He was back in atrial fibrillation in 1/16, and EF was low again on TEE at that time.   Again, he had DCCV.  In 5/16, cardiac MRI showed persistently low EF, so given LBBB, he had Medtronic CRT-D device placed.  He was back in atrial fibrillation in 6/16 and had TEE-guided DCCV again with EF now 15-20% on TEE.  Repeat echo in 2/17 showed EF 55-60%. Echo in 1/19 showed EF 45-50%, mild LV and RV dilation.   Echo in 1/20 showed EF 45-50%, diffuse hypokinesis.   Atrial fibrillation ablation was done in 2/21.  Echo in 2/21 showed EF 40-45%, PASP 50 mmHg, mild MR.   Weight is down 2 lbs.  Stamina is poor, not very active due to foot pain ("bone spurs").  He is short of breath after walking 100-200 yards. Short of breath walking up a hill.  Feeling better overall since atrial fibrillation ablation, in NSR today. No orthopnea/PND. No melena/BRBPR.  ECG (personally reviewed): atrial/biventricular sequential pacing.   Medtronic device interrogation: thoracic impedance stable, no AF, no VT, 99% BiV pacing  Labs (6/16): LDL 153 Labs (7/16): HCT 40, LFTs normal, LFTs normal Labs (9/16): TSH normal, K 4.3, creatinine 1.03, BNP 419 Labs (10/17): K 4.2, creatinine 1.29, LFTs normal Labs (11/17): hgb 8.9 Labs (6/19): BNP 625 Labs (10/19): K 4.7, creatinine 1.66 => 1.58, LFTs normal Labs (1/20): LDL 128 Labs (11/20): K 3.6, creatinine 1.62 Labs (1/21): K 3.8, creatinine 1.89, BNP 578, LDL 115, TSH normal, LFTs  normal Labs (3/21): K 4.7, creatinine 2.12  PMH: 1. Atrial fibrillation: Paroxysmal.  Diagnosed 4/14, DCCV 4/14 to NSR.  DCCV 1/16 to NSR.  DCCV 6/16 to NSR.  2. Chronic systolic CHF: Nonischemic cardiomyopathy.  LHC in 1998 with nonobstructive disease.  Cardiolite in 6/14 with no ischemia or infarction.  cMRI (5/16) with EF 34%, mildly dilated LV with diffuse HK worse in anterolateral wall, small punctate areas of LGE in anteroseptum and basal inferior wall (not CAD pattern).  TEE (6/16) with EF 15-20%.  He has Medtronic CRT-D device.  Echo (9/16) with EF 40%, moderate LV dilation, grade II diastolic dysfunction, normal RV size and systolic function, PASP 42 mmHg.  - Hypotension with Entresto.  - Echo (2/17) showed LV functional recovery, EF 55-60% with mild MR.    - Echo (1/19): EF 45-50%, mild LV dilation, mild RV dilation - Echo (1/20): EF 45-50%, diffuse hypokinesis, moderate diastolic dysfunction, normal RV size and systolic function.  - Echo (2/21): EF 40-45%, PASP 50 mmHg, mild MR.  3. Hyperlipidemia: Myalgias with atorvastatin, Livalo, and pravastatin.  4. CKD stage 3 5. OA: s/p THR.  6. H/o melanoma 7. Anemia 8. Diverticulosis 9. OSA: Uses CPAP.  10. Pulmonary nodules: Followed by Dr. Delton Coombes.  11. Pancreatic pseudocysts 12. PAD: ABIs (2/20) were 0.94 on right and 1.03 on left.   SH: Married with 3 children, lives in [place], retired, quit smoking in 2001.    FH: Mother  with MI  ROS: All systems reviewed and negative except as per HPI.  Current Outpatient Medications  Medication Sig Dispense Refill  . amiodarone (PACERONE) 200 MG tablet Take 1 tablet (200 mg total) by mouth daily. 30 tablet 5  . carvedilol (COREG) 12.5 MG tablet Take 1 tablet (12.5 mg total) by mouth 2 (two) times daily with a meal. 180 tablet 0  . dorzolamide-timolol (COSOPT) 22.3-6.8 MG/ML ophthalmic solution Place 1 drop into the right eye 2 (two) times daily.    [name] escitalopram (LEXAPRO) 20 MG  tablet Take 1 tablet (20 mg total) by mouth daily. 90 tablet 0  . Evolocumab (REPATHA SURECLICK) 518 MG/ML SOAJ Inject 1 pen into the skin every 14 (fourteen) days. 2 pen 11  . ferrous sulfate 325 (65 FE) MG tablet Take 325 mg by mouth daily with breakfast.     . finasteride (PROSCAR) 5 MG tablet Take 1 tablet (5 mg total) by mouth daily. 30 tablet 2  . furosemide (LASIX) 40 MG tablet Take 1 tablet (40 mg total) by mouth daily. 30 tablet 5  . gabapentin (NEURONTIN) 100 MG capsule Take 200 mg by mouth 2 (two) times daily.     [name] latanoprost (XALATAN) 0.005 % ophthalmic solution Place 1 drop into the right eye at bedtime.     Mckinley Jewel Dimesylate (RHOPRESSA) 0.02 % SOLN Place 1 drop into the right eye at bedtime.     . nitroGLYCERIN (NITROSTAT) 0.4 MG SL tablet Place 0.4 mg under the tongue every 5 (five) minutes as needed for chest pain.     . potassium chloride (KLOR-CON) 10 MEQ tablet Take 2 tablets (20 mEq total) by mouth daily. 60 tablet 5  . sacubitril-valsartan (ENTRESTO) 24-26 MG Taking 1/2 tablet by mouth twice a day 30 tablet 5  . spironolactone (ALDACTONE) 25 MG tablet Take 0.5 tablets (12.5 mg total) by mouth daily. 45 tablet 3  . tamsulosin (FLOMAX) 0.4 MG CAPS Take 0.4 mg by mouth at bedtime.     Alveda Reasons 20 MG TABS tablet TAKE 1 TABLET ONCE DAILY WITH SUPPER 90 tablet 0   No current facility-administered medications for this encounter.   BP 124/82   Pulse 76   Wt 98.8 kg (217 lb 12.8 oz)   SpO2 98%   BMI 35.15 kg/m  General: NAD Neck: No JVD, no thyromegaly or thyroid nodule.  Lungs: Clear to auscultation bilaterally with normal respiratory effort. CV: Nondisplaced PMI.  Heart regular S1/S2, no S3/S4, no murmur.  No peripheral edema.  No carotid bruit.  Normal pedal pulses.  Abdomen: Soft, nontender, no hepatosplenomegaly, no distention.  Skin: Intact without lesions or rashes.  Neurologic: Alert and oriented x 3.  Psych: Normal affect. Extremities: No clubbing or  cyanosis.  HEENT: Normal.   Assessment/Plan: 1. Chronic systolic CHF: Suspect nonischemic cardiomyopathy.  EF 15-20% on 6/16 TEE.  Cardiac MRI in 5/16 showed a LGE pattern that was not suggestive of coronary disease (?myocarditis or infiltrative disease).  There may be a component of tachycardia-mediated cardiomyopathy.  However, the cardiac MRI appears to have been done when he was in NSR.  In 9/16, echo showed EF increased to 40%.  Echo in 1/20 showed EF 45-50%.  He has Medtronic CRT-D device.  Echo (2/21) with EF 40-45%.  He remains in NSR.  NYHA class II-III.  Volume status ok by exam and Optivol. - With elevated creatinine and good volume, can decrease Lasix to 40 mg daily.     - He  is only taking Entresto 24/26 1/2 daily once daily.  I asked him to increase it to bid. BMET in 2 wks.  - Continue current Coreg and spironolactone.   2. Atrial fibrillation: Poorly tolerated.  Now s/p atrial fibrillation ablation in 2/21.  No recent atrial fibrillation by device check.   - Continue Xarelto 20 mg daily.   - Continue amiodarone 200 mg daily. LFTs and TSH normal recently.  He should have regular eye exams.   3. Hyperlipidemia: He has started on Repatha recently.  4. OSA: Continue CPAP.   5. PAD: Minimal claudication.     Followup in 3 months.    Loralie Champagne 12/26/2019

## 2020-01-04 ENCOUNTER — Ambulatory Visit (INDEPENDENT_AMBULATORY_CARE_PROVIDER_SITE_OTHER): Payer: Medicare PPO

## 2020-01-04 DIAGNOSIS — I5022 Chronic systolic (congestive) heart failure: Secondary | ICD-10-CM

## 2020-01-04 DIAGNOSIS — Z95810 Presence of automatic (implantable) cardiac defibrillator: Secondary | ICD-10-CM | POA: Diagnosis not present

## 2020-01-06 NOTE — Progress Notes (Signed)
EPIC Encounter for ICM Monitoring  Patient Name: Chad Brown is a 79 y.o. male Date: 01/06/2020 Primary Care Physican: Haywood Pao, MD Primary Cardiologist:Nishan/McLean Electrophysiologist: Audry Riles Pacing:99.9% 4/7/2021Weight: 213lbs  Spoke with patient and reports feeling well at this time.  He did have some weight gain during recent decreased impedance.  He took extra Fuorsemide x 2 days.     Optivol thoracic impedancenormal.  Prescribed:Furosemide40 mg1tablet(40 mg total)daily.Potassium 10 mEq take2tabletsdaily.  Labs: 12/24/2019 Creatinine 2.32, BUN 26, Potassium 4.4, Sodium 139, GFR 26-30 12/11/2019 Creatinine 2.12, BUN 27, Potassium 4.7, Sodium 140, GFR 29-33 12/01/2019 Creatinine 2.23, BUN 32, Potassium 4.4, Sodium 136, GFR 27-32 11/12/2019 Creatinine 1.82, BUN 16, Potassium 4.0, Sodium 139, GFR 35-40  10/27/2019 Creatinine 1.89, BUN 18, Potassium 3.8, Sodium 138, GFR 33-39  08/25/2019 Creatinine 1.62, BUN 18, Potassium 3.6, Sodium 137, GFR 40-46 07/27/2019 Creatinine 1.76, BUN 21, Potassium 4.7, Sodium 141, GFR 36-42 01/20/2019 Creatinine 1.76, BUN 25, Potassium 4.5, Sodium 139, GFR 36-42 10/22/2018 Creatinine 1.53, BUN 16, Potassium 4.4, Sodium 139, GFR 43-50 A complete set of results can be found in Results Review.  Recommendations:No changes and encouraged to call if experiencing any fluid symptoms.  Follow-up plan: ICM clinic phone appointment on5/08/2020. 91 day device clinic remote transmission 03/03/2020.Office visit with Dr Rayann Heman on5/11/2019.  Copy of ICM check sent to Dr.Allred.  3 month ICM trend: 01/04/2020    1 Year ICM trend:       Rosalene Billings, RN 01/06/2020 9:56 AM

## 2020-01-07 ENCOUNTER — Ambulatory Visit (HOSPITAL_COMMUNITY)
Admission: RE | Admit: 2020-01-07 | Discharge: 2020-01-07 | Disposition: A | Discharge status: Home / Self Care | Payer: Medicare PPO | Source: Ambulatory Visit | Attending: Cardiology | Admitting: Cardiology

## 2020-01-07 ENCOUNTER — Other Ambulatory Visit: Payer: Self-pay

## 2020-01-07 DIAGNOSIS — I5022 Chronic systolic (congestive) heart failure: Secondary | ICD-10-CM | POA: Insufficient documentation

## 2020-01-07 LAB — BASIC METABOLIC PANEL
Anion gap: 9 (ref 5–15)
BUN: 29 mg/dL — ABNORMAL HIGH (ref 8–23)
CO2: 30 mmol/L (ref 22–32)
Calcium: 8.4 mg/dL — ABNORMAL LOW (ref 8.9–10.3)
Chloride: 102 mmol/L (ref 98–111)
Creatinine, Ser: 2.3 mg/dL — ABNORMAL HIGH (ref 0.61–1.24)
GFR calc Af Amer: 30 mL/min — ABNORMAL LOW (ref >60)
GFR calc non Af Amer: 26 mL/min — ABNORMAL LOW (ref >60)
Glucose, Bld: 110 mg/dL — ABNORMAL HIGH (ref 70–99)
Potassium: 4.9 mmol/L (ref 3.5–5.1)
Sodium: 141 mmol/L (ref 135–145)

## 2020-01-25 ENCOUNTER — Inpatient Hospital Stay (HOSPITAL_COMMUNITY): Payer: Medicare PPO | Attending: Hematology

## 2020-01-25 ENCOUNTER — Other Ambulatory Visit: Payer: Self-pay

## 2020-01-25 ENCOUNTER — Ambulatory Visit (HOSPITAL_COMMUNITY)
Admission: RE | Admit: 2020-01-25 | Discharge: 2020-01-25 | Disposition: A | Discharge status: Home / Self Care | Payer: Medicare PPO | Source: Ambulatory Visit | Attending: Hematology | Admitting: Hematology

## 2020-01-25 DIAGNOSIS — C439 Malignant melanoma of skin, unspecified: Secondary | ICD-10-CM | POA: Diagnosis present

## 2020-01-25 DIAGNOSIS — F17210 Nicotine dependence, cigarettes, uncomplicated: Secondary | ICD-10-CM | POA: Diagnosis not present

## 2020-01-25 DIAGNOSIS — R918 Other nonspecific abnormal finding of lung field: Secondary | ICD-10-CM | POA: Insufficient documentation

## 2020-01-25 DIAGNOSIS — N189 Chronic kidney disease, unspecified: Secondary | ICD-10-CM | POA: Diagnosis not present

## 2020-01-25 DIAGNOSIS — Z7901 Long term (current) use of anticoagulants: Secondary | ICD-10-CM | POA: Insufficient documentation

## 2020-01-25 DIAGNOSIS — D696 Thrombocytopenia, unspecified: Secondary | ICD-10-CM | POA: Diagnosis not present

## 2020-01-25 DIAGNOSIS — Z79899 Other long term (current) drug therapy: Secondary | ICD-10-CM | POA: Insufficient documentation

## 2020-01-25 DIAGNOSIS — Z85820 Personal history of malignant melanoma of skin: Secondary | ICD-10-CM | POA: Insufficient documentation

## 2020-01-25 DIAGNOSIS — D649 Anemia, unspecified: Secondary | ICD-10-CM | POA: Insufficient documentation

## 2020-01-25 LAB — CBC WITH DIFFERENTIAL/PLATELET
Abs Immature Granulocytes: 0.01 10*3/uL (ref 0.00–0.07)
Basophils Absolute: 0.1 10*3/uL (ref 0.0–0.1)
Basophils Relative: 1 %
Eosinophils Absolute: 0.2 10*3/uL (ref 0.0–0.5)
Eosinophils Relative: 3 %
HCT: 36.1 % — ABNORMAL LOW (ref 39.0–52.0)
Hemoglobin: 11.3 g/dL — ABNORMAL LOW (ref 13.0–17.0)
Immature Granulocytes: 0 %
Lymphocytes Relative: 21 %
Lymphs Abs: 1.2 10*3/uL (ref 0.7–4.0)
MCH: 30 pg (ref 26.0–34.0)
MCHC: 31.3 g/dL (ref 30.0–36.0)
MCV: 95.8 fL (ref 80.0–100.0)
Monocytes Absolute: 0.5 10*3/uL (ref 0.1–1.0)
Monocytes Relative: 8 %
Neutro Abs: 3.8 10*3/uL (ref 1.7–7.7)
Neutrophils Relative %: 67 %
Platelets: 128 10*3/uL — ABNORMAL LOW (ref 150–400)
RBC: 3.77 MIL/uL — ABNORMAL LOW (ref 4.22–5.81)
RDW: 14.6 % (ref 11.5–15.5)
WBC: 5.7 10*3/uL (ref 4.0–10.5)
nRBC: 0 % (ref 0.0–0.2)

## 2020-01-25 LAB — COMPREHENSIVE METABOLIC PANEL
ALT: 17 U/L (ref 0–44)
AST: 14 U/L — ABNORMAL LOW (ref 15–41)
Albumin: 3.5 g/dL (ref 3.5–5.0)
Alkaline Phosphatase: 91 U/L (ref 38–126)
Anion gap: 10 (ref 5–15)
BUN: 34 mg/dL — ABNORMAL HIGH (ref 8–23)
CO2: 28 mmol/L (ref 22–32)
Calcium: 8.4 mg/dL — ABNORMAL LOW (ref 8.9–10.3)
Chloride: 103 mmol/L (ref 98–111)
Creatinine, Ser: 2.54 mg/dL — ABNORMAL HIGH (ref 0.61–1.24)
GFR calc Af Amer: 27 mL/min — ABNORMAL LOW (ref >60)
GFR calc non Af Amer: 23 mL/min — ABNORMAL LOW (ref >60)
Glucose, Bld: 114 mg/dL — ABNORMAL HIGH (ref 70–99)
Potassium: 5 mmol/L (ref 3.5–5.1)
Sodium: 141 mmol/L (ref 135–145)
Total Bilirubin: 0.4 mg/dL (ref 0.3–1.2)
Total Protein: 6.9 g/dL (ref 6.5–8.1)

## 2020-01-25 LAB — FERRITIN: Ferritin: 221 ng/mL (ref 24–336)

## 2020-01-25 LAB — IRON AND TIBC
Iron: 73 ug/dL (ref 45–182)
Saturation Ratios: 27 % (ref 17.9–39.5)
TIBC: 270 ug/dL (ref 250–450)
UIBC: 197 ug/dL

## 2020-01-25 LAB — LACTATE DEHYDROGENASE: LDH: 128 U/L (ref 98–192)

## 2020-01-28 ENCOUNTER — Inpatient Hospital Stay (HOSPITAL_COMMUNITY): Payer: Medicare PPO | Admitting: Hematology

## 2020-01-28 ENCOUNTER — Other Ambulatory Visit: Payer: Self-pay

## 2020-01-28 VITALS — BP 76/48 | HR 61 | Temp 97.8°F | Resp 18 | Wt 217.0 lb

## 2020-01-28 DIAGNOSIS — D696 Thrombocytopenia, unspecified: Secondary | ICD-10-CM | POA: Diagnosis not present

## 2020-01-28 DIAGNOSIS — R918 Other nonspecific abnormal finding of lung field: Secondary | ICD-10-CM | POA: Diagnosis not present

## 2020-01-28 DIAGNOSIS — C439 Malignant melanoma of skin, unspecified: Secondary | ICD-10-CM

## 2020-01-28 NOTE — Progress Notes (Signed)
Patient Care Team: Tisovec, Fransico Him, MD as PCP - General (Internal Medicine) Josue Hector, MD as PCP - Cardiology (Cardiology) Sueanne Margarita, MD as PCP - Sleep Medicine (Cardiology)  DIAGNOSIS:  Encounter Diagnoses  Name Primary?  . Melanoma of skin ([place]) Yes  . Lung nodules   . Thrombocytopenia ([place])      CHIEF COMPLIANT: Follow-up for melanoma.  INTERVAL HISTORY: Chad Brown is a 79 y.o. seen for follow-up of lung nodules in the setting of malignant melanoma which was resected.  He also has mild thrombocytopenia but denies any easy bruising or active bleeding.  Appetite is 100%.  Energy levels are 50%.  Numbness in the fingers of the left hand is stable.  REVIEW OF SYSTEMS:   Constitutional: Denies fevers, chills or abnormal weight loss.  Positive for dizziness. Eyes: Denies blurriness of vision Ears, nose, mouth, throat, and face: Denies mucositis or sore throat Respiratory: Denies cough, dyspnea or wheezes Cardiovascular: Denies palpitation, chest discomfort Gastrointestinal:  Denies nausea, heartburn or change in bowel habits Skin: Denies abnormal skin rashes Lymphatics: Denies new lymphadenopathy or easy bruising Neurological: Positive for numbness in the left hand. Behavioral/Psych: Mood is stable, no new changes  Extremities: No lower extremity edema All other systems were reviewed with the patient and are negative.  I have reviewed the past medical history, past surgical history, social history and family history with the patient and they are unchanged from previous note.  ALLERGIES:  is allergic to pravastatin sodium and azithromycin.  MEDICATIONS:  Current Outpatient Medications  Medication Sig Dispense Refill  . amiodarone (PACERONE) 200 MG tablet Take 1 tablet (200 mg total) by mouth daily. 30 tablet 5  . carvedilol (COREG) 12.5 MG tablet Take 1 tablet (12.5 mg total) by mouth 2 (two) times daily with a meal. 180 tablet 0  . dorzolamide-timolol  (COSOPT) 22.3-6.8 MG/ML ophthalmic solution Place 1 drop into the right eye 2 (two) times daily.    [name] escitalopram (LEXAPRO) 20 MG tablet Take 1 tablet (20 mg total) by mouth daily. 90 tablet 0  . Evolocumab (REPATHA SURECLICK) 825 MG/ML SOAJ Inject 1 pen into the skin every 14 (fourteen) days. 2 pen 11  . ferrous sulfate 325 (65 FE) MG tablet Take 325 mg by mouth daily with breakfast.     . finasteride (PROSCAR) 5 MG tablet Take 1 tablet (5 mg total) by mouth daily. 30 tablet 2  . furosemide (LASIX) 40 MG tablet Take 1 tablet (40 mg total) by mouth daily. 30 tablet 5  . gabapentin (NEURONTIN) 100 MG capsule Take 200 mg by mouth 2 (two) times daily.     [name] latanoprost (XALATAN) 0.005 % ophthalmic solution Place 1 drop into the right eye at bedtime.     . potassium chloride (KLOR-CON) 10 MEQ tablet Take 2 tablets (20 mEq total) by mouth daily. 60 tablet 5  . sacubitril-valsartan (ENTRESTO) 24-26 MG Taking 1/2 tablet by mouth twice a day 30 tablet 5  . spironolactone (ALDACTONE) 25 MG tablet Take 0.5 tablets (12.5 mg total) by mouth daily. 45 tablet 3  . tamsulosin (FLOMAX) 0.4 MG CAPS Take 0.4 mg by mouth at bedtime.     Alveda Reasons 20 MG TABS tablet TAKE 1 TABLET ONCE DAILY WITH SUPPER 90 tablet 0  . Netarsudil Dimesylate (RHOPRESSA) 0.02 % SOLN Place 1 drop into the right eye at bedtime.     . nitroGLYCERIN (NITROSTAT) 0.4 MG SL tablet Place 0.4 mg under the tongue every  5 (five) minutes as needed for chest pain.      No current facility-administered medications for this visit.    PHYSICAL EXAMINATION: ECOG PERFORMANCE STATUS: 1 - Symptomatic but completely ambulatory  Vitals:   01/28/20 1136  BP: (!) 76/48  Pulse: 61  Resp: 18  Temp: 97.8 F (36.6 C)  SpO2: 100%   Filed Weights   01/28/20 1136  Weight: 217 lb (98.4 kg)    GENERAL:alert, no distress and comfortable SKIN: skin color, texture, turgor are normal, no rashes or significant lesions EYES: normal, Conjunctiva are pink and  non-injected, sclera clear OROPHARYNX:no mucositis, no erythema and lips, buccal mucosa, and tongue normal  NECK: supple, thyroid normal size, non-tender, without nodularity LYMPH:  no palpable lymphadenopathy in the cervical, axillary or inguinal LUNGS: clear to auscultation and percussion with normal breathing effort HEART: regular rate & rhythm and no murmurs and no lower extremity edema ABDOMEN:abdomen soft, non-tender and normal bowel sounds MUSCULOSKELETAL:no cyanosis of digits and no clubbing  EXTREMITIES: No lower extremity edema   LABORATORY DATA:  I have reviewed the data as listed CMP Latest Ref Rng & Units 01/25/2020 01/07/2020 12/24/2019  Glucose 70 - 99 mg/dL 114(H) 110(H) 99  BUN 8 - 23 mg/dL 34(H) 29(H) 26(H)  Creatinine 0.61 - 1.24 mg/dL 2.54(H) 2.30(H) 2.32(H)  Sodium 135 - 145 mmol/L 141 141 139  Potassium 3.5 - 5.1 mmol/L 5.0 4.9 4.4  Chloride 98 - 111 mmol/L 103 102 103  CO2 22 - 32 mmol/L 28 30 28   Calcium 8.9 - 10.3 mg/dL 8.4(L) 8.4(L) 8.2(L)  Total Protein 6.5 - 8.1 g/dL 6.9 - -  Total Bilirubin 0.3 - 1.2 mg/dL 0.4 - -  Alkaline Phos 38 - 126 U/L 91 - -  AST 15 - 41 U/L 14(L) - -  ALT 0 - 44 U/L 17 - -   No results found for: POE423   Lab Results  Component Value Date   WBC 5.7 01/25/2020   HGB 11.3 (L) 01/25/2020   HCT 36.1 (L) 01/25/2020   MCV 95.8 01/25/2020   PLT 128 (L) 01/25/2020   NEUTROABS 3.8 01/25/2020   I have reviewed the scans.  ASSESSMENT & PLAN:  Melanoma of skin ([place]) 1.  Lung nodules: -He was neck smoker, smoked more than 2 packs/day for many years and quit in 2001. -We reviewed CT of the chest without contrast from 01/25/2020.  Stable small bilateral pulmonary nodules, largest measuring 5 mm in the right lung base.  Stable cardiomegaly without pericardial effusion. -I plan to repeat scans in 6 months.  2.  T2 N0 melanoma of the left eye: -Status post resection in 2015 at [facility].  Denies any new onset pains including headaches. -We  reviewed CT scan of the chest, has no sign of metastatic disease.  3.  CKD: -His creatinine is gradually getting worse.  It is 2.54.  We will make referral to Dr. Theador Hawthorne.  4.  Mild thrombocytopenia: -He has mild thrombocytopenia several years duration.  Latest platelet count is 128. -We will keep a close eye on it.  He does not have any easy bruising or bleeding.  5.  Normocytic anemia: -Hemoglobin is 11.3 and MCV is 95.  Ferritin is 221 and percent saturation is 27.  LDH is 128. -If there is any worsening, consider parenteral iron therapy.      Orders Placed This Encounter  Procedures  . CT Chest Wo Contrast    Standing Status:   Future  Standing Expiration Date:   01/27/2021    Order Specific Question:   Preferred imaging location?    Answer:   [facility]    Order Specific Question:   Radiology Contrast Protocol - do NOT remove file path    Answer:   [email or web address]  . CBC with Differential    Standing Status:   Future    Standing Expiration Date:   01/27/2021  . Comprehensive metabolic panel    Standing Status:   Future    Standing Expiration Date:   01/27/2021  . Iron and TIBC    Standing Status:   Future    Standing Expiration Date:   01/27/2021  . Ferritin    Standing Status:   Future    Standing Expiration Date:   01/27/2021   The patient has a good understanding of the overall plan. he agrees with it. he will call with any problems that may develop before the next visit here.  Derek Jack, MD 01/30/20

## 2020-01-28 NOTE — Patient Instructions (Addendum)
[place] at [facility] Discharge Instructions  You were seen today by Dr. Delton Coombes. He went over your recent lab results. Please follow up with your heart doctor for the low blood pressure. Your kidney labs were elevated today, he will refer you to a kidney doctor for evaluation. He will see you back in 6 months for labs, CT chest and follow up.   Thank you for choosing [place] at [facility] to provide your oncology and hematology care.  To afford each patient quality time with our provider, please arrive at least 15 minutes before your scheduled appointment time.   If you have a lab appointment with the [facility] please come in thru the  Main Entrance and check in at the main information desk  You need to re-schedule your appointment should you arrive 10 or more minutes late.  We strive to give you quality time with our providers, and arriving late affects you and other patients whose appointments are after yours.  Also, if you no show three or more times for appointments you may be dismissed from the clinic at the providers discretion.     Again, thank you for choosing [facility].  Our hope is that these requests will decrease the amount of time that you wait before being seen by our physicians.       _____________________________________________________________  Should you have questions after your visit to [facility], please contact our office at [phone] between the hours of 8:00 a.m. and 4:30 p.m.  Voicemails left after 4:00 p.m. will not be returned until the following business day.  For prescription refill requests, have your pharmacy contact our office and allow 72 hours.    Cancer Center Support Programs:   > Cancer Support Group  2nd Tuesday of the month 1pm-2pm, Journey Room

## 2020-01-30 NOTE — Assessment & Plan Note (Signed)
1.  Lung nodules: -He was neck smoker, smoked more than 2 packs/day for many years and quit in 2001. -We reviewed CT of the chest without contrast from 01/25/2020.  Stable small bilateral pulmonary nodules, largest measuring 5 mm in the right lung base.  Stable cardiomegaly without pericardial effusion. -I plan to repeat scans in 6 months.  2.  T2 N0 melanoma of the left eye: -Status post resection in 2015 at [facility].  Denies any new onset pains including headaches. -We reviewed CT scan of the chest, has no sign of metastatic disease.  3.  CKD: -His creatinine is gradually getting worse.  It is 2.54.  We will make referral to Dr. Theador Hawthorne.  4.  Mild thrombocytopenia: -He has mild thrombocytopenia several years duration.  Latest platelet count is 128. -We will keep a close eye on it.  He does not have any easy bruising or bleeding.  5.  Normocytic anemia: -Hemoglobin is 11.3 and MCV is 95.  Ferritin is 221 and percent saturation is 27.  LDH is 128. -If there is any worsening, consider parenteral iron therapy.

## 2020-02-01 ENCOUNTER — Telehealth (INDEPENDENT_AMBULATORY_CARE_PROVIDER_SITE_OTHER): Payer: Medicare PPO | Admitting: Internal Medicine

## 2020-02-01 ENCOUNTER — Other Ambulatory Visit: Payer: Self-pay

## 2020-02-01 ENCOUNTER — Telehealth: Payer: Self-pay

## 2020-02-01 ENCOUNTER — Encounter (HOSPITAL_COMMUNITY): Payer: Self-pay | Admitting: Lab

## 2020-02-01 ENCOUNTER — Encounter: Payer: Self-pay | Admitting: Internal Medicine

## 2020-02-01 VITALS — BP 114/74 | HR 75 | Wt 212.4 lb

## 2020-02-01 DIAGNOSIS — I5022 Chronic systolic (congestive) heart failure: Secondary | ICD-10-CM

## 2020-02-01 DIAGNOSIS — Z95810 Presence of automatic (implantable) cardiac defibrillator: Secondary | ICD-10-CM

## 2020-02-01 DIAGNOSIS — I959 Hypotension, unspecified: Secondary | ICD-10-CM

## 2020-02-01 DIAGNOSIS — I10 Essential (primary) hypertension: Secondary | ICD-10-CM | POA: Diagnosis not present

## 2020-02-01 DIAGNOSIS — I4819 Other persistent atrial fibrillation: Secondary | ICD-10-CM

## 2020-02-01 DIAGNOSIS — I428 Other cardiomyopathies: Secondary | ICD-10-CM

## 2020-02-01 DIAGNOSIS — D6869 Other thrombophilia: Secondary | ICD-10-CM

## 2020-02-01 MED ORDER — SPIRONOLACTONE 25 MG PO TABS: 12.5000 mg | ORAL_TABLET | Freq: Every day | ORAL | 3 refills | Status: DC

## 2020-02-01 MED ORDER — FUROSEMIDE 20 MG PO TABS: 20.0000 mg | ORAL_TABLET | Freq: Every day | ORAL | 3 refills | Status: DC

## 2020-02-01 MED ORDER — CARVEDILOL 6.25 MG PO TABS
6.2500 mg | ORAL_TABLET | Freq: Two times a day (BID) | ORAL | 3 refills | Status: DC
Start: 2020-02-01 — End: 2020-11-23

## 2020-02-01 NOTE — Telephone Encounter (Signed)
Received call from Dr Rayann Heman this AM.  He had telehealth visit with patient today and patient's BP has been very low. Dr Rayann Heman adjusted some medications.  He and Dr Aundra Dubin requested a Optivol check today and if fluid levels are within normal threshold then have patient decrease Lasix to 20 mg daily.  Will send copy of remote transmission to Dr Rayann Heman and Dr Aundra Dubin after receiving and reviewed.

## 2020-02-01 NOTE — Progress Notes (Signed)
Electrophysiology TeleHealth Note   Due to national recommendations of social distancing due to [place] 19, an audio telehealth visit is felt to be most appropriate for this patient at this time.  See MyChart message from today for the patient's consent to telehealth for [facility].  Date:  02/01/2020   ID:  Chad Brown, DOB 1941/03/09, MRN [id number]  Location: patient's home  Provider location:  [place]  Evaluation Performed: Follow-up visit  PCP:  Tisovec, Fransico Him, MD   Electrophysiologist:  Dr Rayann Heman  Chief Complaint:  palpitations  History of Present Illness:    Chad Brown is a 79 y.o. male who presents via telehealth conferencing today.  Since his ablation, the patient has had no afib.  He is pleased with results of his ablation.  Denies problem related complications.  His primary concern is with worsening renal failure and hypotension.  He has had postural dizziness and brief syncope.  He reports that his BP is very low (SBP 70s) after taking his CHF medicines. Today, he denies symptoms of afib.  The patient is otherwise without complaint today.   Past Medical History:  Diagnosis Date  . Adenomatous colon polyp    tubular  . AICD (automatic cardioverter/defibrillator) present 03/08/2015   MDT CRTD   . Anemia    iron deficient  . Arthritis    "about all my joints; hands, knees, back" (03/08/2015)  . Atherosclerosis   . Cataract    left eye small  . Cholelithiasis    gallstones  . Chronic systolic CHF (congestive heart failure) ([place])    a. New dx 12/2012 ? NICM, may be r/t afib. b. Nuc 03/2013 - normal;  c. 03/2015 TEE EF 15-20%.  . Colon polyp, hyperplastic 5/16   removed precancerous lesions  . Depression   . Diverticulosis   . Glaucoma    right eye  . Hyperlipidemia   . Hypertension   . Melanoma of eye ([place]) 2000's   "right; it's never been biopsied"  . Melanoma of lower leg ([place]) 2015   "left; right at my knee"  . Myocardial infarction  ([place]) 1998  . OSA (obstructive sleep apnea) 01/04/2016   uses cpap, pt does not know settings auto set  . Peripheral vision loss, right 2006  . Persistent atrial fibrillation ([place])    a. Dx 12/2012, s/p TEE/DCCV 01/26/13. b. On Xarelto (CHA2DS2VASc = 3);  c. 03/2015 TEE (EF 15-20%, no LAA thrombus) and DCCV - amio increased to 200 mg bid.  . Urinary hesitancy due to benign prostatic hypertrophy     Past Surgical History:  Procedure Laterality Date  . ATRIAL FIBRILLATION ABLATION N/A 11/03/2019   Procedure: ATRIAL FIBRILLATION ABLATION;  Surgeon: Thompson Grayer, MD;  Location: [facility];  Service: Cardiovascular;  Laterality: N/A;  . BACK SURGERY     upper back, cannot turn neck well  . CARDIAC CATHETERIZATION  1998  . CARDIOVERSION N/A 01/26/2013   Procedure: CARDIOVERSION;  Surgeon: Lelon Perla, MD;  Location: [facility] ENDOSCOPY;  Service: Cardiovascular;  Laterality: N/A;  . CARDIOVERSION N/A 03/23/2015   Procedure: CARDIOVERSION;  Surgeon: Jerline Pain, MD;  Location: [facility] ENDOSCOPY;  Service: Cardiovascular;  Laterality: N/A;  . CARDIOVERSION N/A 08/14/2017   Procedure: CARDIOVERSION;  Surgeon: Josue Hector, MD;  Location: [place];  Service: Cardiovascular;  Laterality: N/A;  . CATARACT EXTRACTION Right ~ 2006  . COLONOSCOPY WITH PROPOFOL N/A 02/10/2015   Procedure: COLONOSCOPY WITH PROPOFOL;  Surgeon:  Gatha Mayer, MD;  Location: [facility];  Service: Endoscopy;  Laterality: N/A;  . COLONOSCOPY WITH PROPOFOL N/A 08/07/2016   Procedure: COLONOSCOPY WITH PROPOFOL;  Surgeon: Gatha Mayer, MD;  Location: [facility] ENDOSCOPY;  Service: Endoscopy;  Laterality: N/A;  . ENTEROSCOPY N/A 08/17/2015   Procedure: ENTEROSCOPY;  Surgeon: Gatha Mayer, MD;  Location: [facility] ENDOSCOPY;  Service: Endoscopy;  Laterality: N/A;  . EP IMPLANTABLE DEVICE N/A 03/08/2015   MDT Hillery Aldo CRT-D for nonischemic CM by Dr Rayann Heman for primary prevention  . GLAUCOMA SURGERY Right ~ 2006   "put 3 stents in to  drain fluid" (03/08/2015) not successful, sent to [facility] to try to get last stent out  . HOT HEMOSTASIS N/A 08/07/2016   Procedure: HOT HEMOSTASIS (ARGON PLASMA COAGULATION/BICAP);  Surgeon: Gatha Mayer, MD;  Location: [facility];  Service: Endoscopy;  Laterality: N/A;  . INCISION AND DRAINAGE ABSCESS POSTERIOR CERVICALSPINE  05/2012  . JOINT REPLACEMENT    . MELANOMA EXCISION Left 2015   "lower leg; right at my knee"  . REFRACTIVE SURGERY Right ~ 2006 X 2   "twice; both done at [facility]" (03/08/2015  . SURGERY SCROTAL / TESTICULAR Right 1990's  . TEE WITHOUT CARDIOVERSION N/A 01/26/2013   Procedure: TRANSESOPHAGEAL ECHOCARDIOGRAM (TEE);  Surgeon: Lelon Perla, MD;  Location: [place];  Service: Cardiovascular;  Laterality: N/A;  [name] anes. /   . TEE WITHOUT CARDIOVERSION N/A 10/05/2014   Procedure: TRANSESOPHAGEAL ECHOCARDIOGRAM (TEE)  with cardioversion;  Surgeon: Thayer Headings, MD;  Location: [facility] ENDOSCOPY;  Service: Cardiovascular;  Laterality: N/A;  12:52 synched cardioversion at 120 joules,...afib to SR...12 lead EKG ordered.Marland KitchenMarland KitchenCardiozem d/c'ed per MD verbal order at SR  . TEE WITHOUT CARDIOVERSION N/A 03/23/2015   Procedure: TRANSESOPHAGEAL ECHOCARDIOGRAM (TEE);  Surgeon: Jerline Pain, MD;  Location: [place] [facility];  Service: Cardiovascular;  Laterality: N/A;  . TEE WITHOUT CARDIOVERSION N/A 11/02/2019   Procedure: TRANSESOPHAGEAL ECHOCARDIOGRAM (TEE);  Surgeon: Josue Hector, MD;  Location: [facility] ENDOSCOPY;  Service: Cardiovascular;  Laterality: N/A;  . THORACIC SPINE SURGERY  03/2000   "ground calcium deposits from upper thoracic" (01/26/2013)  . TOTAL HIP ARTHROPLASTY Right 06/2007    Current Outpatient Medications  Medication Sig Dispense Refill  . amiodarone (PACERONE) 200 MG tablet Take 1 tablet (200 mg total) by mouth daily. 30 tablet 5  . carvedilol (COREG) 12.5 MG tablet Take 1 tablet (12.5 mg total) by mouth 2 (two) times daily with a meal. 180 tablet 0  . dorzolamide-timolol  (COSOPT) 22.3-6.8 MG/ML ophthalmic solution Place 1 drop into the right eye 2 (two) times daily.    [name] escitalopram (LEXAPRO) 20 MG tablet Take 1 tablet (20 mg total) by mouth daily. 90 tablet 0  . Evolocumab (REPATHA SURECLICK) 751 MG/ML SOAJ Inject 1 pen into the skin every 14 (fourteen) days. 2 pen 11  . ferrous sulfate 325 (65 FE) MG tablet Take 325 mg by mouth daily with breakfast.     . finasteride (PROSCAR) 5 MG tablet Take 1 tablet (5 mg total) by mouth daily. 30 tablet 2  . furosemide (LASIX) 40 MG tablet Take 1 tablet (40 mg total) by mouth daily. 30 tablet 5  . gabapentin (NEURONTIN) 100 MG capsule Take 200 mg by mouth 2 (two) times daily.     [name] latanoprost (XALATAN) 0.005 % ophthalmic solution Place 1 drop into the right eye at bedtime.     Mckinley Jewel Dimesylate (RHOPRESSA) 0.02 % SOLN Place 1 drop into the right eye  at bedtime.     . nitroGLYCERIN (NITROSTAT) 0.4 MG SL tablet Place 0.4 mg under the tongue every 5 (five) minutes as needed for chest pain.     . potassium chloride (KLOR-CON) 10 MEQ tablet Take 2 tablets (20 mEq total) by mouth daily. 60 tablet 5  . sacubitril-valsartan (ENTRESTO) 24-26 MG Taking 1/2 tablet by mouth twice a day 30 tablet 5  . spironolactone (ALDACTONE) 25 MG tablet Take 0.5 tablets (12.5 mg total) by mouth daily. 45 tablet 3  . tamsulosin (FLOMAX) 0.4 MG CAPS Take 0.4 mg by mouth at bedtime.     Alveda Reasons 20 MG TABS tablet TAKE 1 TABLET ONCE DAILY WITH SUPPER 90 tablet 0   No current facility-administered medications for this visit.    Allergies:   Pravastatin sodium and Azithromycin   Social History:  The patient  reports that he quit smoking about 19 years ago. His smoking use included cigarettes. He has a 144.00 pack-year smoking history. He has never used smokeless tobacco. He reports that he does not drink alcohol or use drugs.   ROS:  Please see the history of present illness.   All other systems are personally reviewed and negative.     Exam:    Vital Signs:  BP 114/74   Pulse 75   Wt 212 lb 6.4 oz (96.3 kg)   BMI 34.28 kg/m   Well sounding, alert and conversant   Labs/Other Tests and Data Reviewed:    Recent Labs: 10/27/2019: B Natriuretic Peptide 578.2; TSH 3.441 01/25/2020: ALT 17; BUN 34; Creatinine, Ser 2.54; Hemoglobin 11.3; Platelets 128; Potassium 5.0; Sodium 141   Wt Readings from Last 3 Encounters:  02/01/20 212 lb 6.4 oz (96.3 kg)  01/28/20 217 lb (98.4 kg)  12/24/19 217 lb 12.8 oz (98.8 kg)     Last device remote is reviewed from [place] PDF which reveals normal device function, no arrhythmias   ASSESSMENT & PLAN:    1.  Persistent afib Doing very well post ablation Stop amiodarone Continue xarelto May need to adjust dose if his renal function worsens Stop amiodarone today  2. Chronic systolic dysfunction Therapy is complicated by renal failure and hypotension I spoke at length with Dr Aundra Dubin today. Our plan is to stop amiodarone, reduce coreg to 6.25mg  BID, and have him take his spirinolactone at night. I will have ICM device clinic check thoracic impedance and reduce his lasix to 20mg  daily if impedance is within range. Will need close follow-up in CHF clinic  3. Stage IIIb renal failure He states that oncology has referred him to nephrology which is appropriate  4. OSA Compliant with CPAP  5. Hypotension/ syncope Recent labs reviewed Discussed with Dr Aundra Dubin Recent echo is reviewed and shows no effusion post ablation  Follow-up:  3 months with me CHF clinic as scheduled  Very complicated patient.  A high level of decision making was required for this encounter.   Patient Risk:  after full review of this patients clinical status, I feel that they are at high risk at this time.  Today, I have spent 25 minutes with the patient with telehealth technology discussing arrhythmia management .    Army Fossa, MD  02/01/2020 9:26 AM     [facility] [address] [place] [address] [phone] (office) [phone] (fax)

## 2020-02-01 NOTE — Telephone Encounter (Signed)
Left detailed message for Pt advising of medication changes.  Will send AVS to patient.

## 2020-02-01 NOTE — Progress Notes (Unsigned)
Referral sent to Dr Theador Hawthorne  Records faxed on 5/3

## 2020-02-01 NOTE — Telephone Encounter (Signed)
Attempted call to patient to request a remote transmission be sent for review of fluid levels.  Left message for patient to return call.

## 2020-02-01 NOTE — Telephone Encounter (Signed)
Attempted call to patient and no message left.

## 2020-02-01 NOTE — Telephone Encounter (Signed)
-----   Message from Thompson Grayer, MD sent at 02/01/2020  9:36 AM EDT ----- Stop amiodarone  Reduce coreg to 6.25mg  BID  Take spironolactone 12.5mg  in the evening rather than the morning  Follow-up with me in 3 months

## 2020-02-01 NOTE — Telephone Encounter (Signed)
Spoke with patient.  Assisted in sending remote transmission and was received.  Reviewed transmission and Optivol has been trending at baseline normal.  Advised patient Dr Jackalyn Lombard gave orders to decrease Furosemide 20 mg daily if Optivol stable.  Advised patient to decrease Lasix to 20 mg daily and to call if he experiences any fluid accumulations to call the office. He does not need a Furosemide refill at this time.  Advised he will need to call pharmacy for refill when needed.   Next ICM remote transmission scheduled for 02/09/2020.  Phone note routed to Dr Rayann Heman and Dr Aundra Dubin for [facility] and review of [place].     02/01/2020 Optivol thoracic impedance normal.

## 2020-02-09 ENCOUNTER — Ambulatory Visit (INDEPENDENT_AMBULATORY_CARE_PROVIDER_SITE_OTHER): Payer: Medicare PPO

## 2020-02-09 DIAGNOSIS — I5022 Chronic systolic (congestive) heart failure: Secondary | ICD-10-CM | POA: Diagnosis not present

## 2020-02-09 DIAGNOSIS — Z95810 Presence of automatic (implantable) cardiac defibrillator: Secondary | ICD-10-CM

## 2020-02-12 NOTE — Progress Notes (Signed)
EPIC Encounter for ICM Monitoring  Patient Name: Chad Brown is a 79 y.o. male Date: 02/12/2020 Primary Care Physican: Haywood Pao, MD Primary Cardiologist:Nishan/McLean Electrophysiologist: Audry Riles Pacing:99.9% 5/14/2021Weight: 213lbs  Spoke with patient and reports feeling well at this time.  Denies fluid symptoms.   He reports Furosemide was decreased to 20 mg daily by Dr Rayann Heman due to kidney function.   He has been self adjusting the Furosemide due to weight increase since Furosemide dosage was decreased.   He eats restaurant foods most of the time. Discussed reading food labels for salt content and advised to limit salt intake to 2000 mg daily.  Optivol thoracic impedanceslightly below baseline suggesting possible fluid accumulation  Prescribed:Furosemide20 mg1tablet(20 mg total)daily.Potassium 10 mEq take2tabletsdaily.  Labs: 01/25/2020 Creatinine 2.54, BUN 34, Potassium 5.0, Sodium 141, GFR 23-27 01/07/2020 Creatinine 2.30, BUN 29, Potassium 4.9, Sodium 141, GFR 26-30 12/24/2019 Creatinine 2.32, BUN 26, Potassium 4.4, Sodium 139, GFR 26-30 12/11/2019 Creatinine 2.12, BUN 27, Potassium 4.7, Sodium 140, GFR 29-33 12/01/2019 Creatinine2.23, BUN32, Potassium4.4, Sodium136, [id number] A complete set of results can be found in Results Review.  Recommendations: Advised to call Dr Claris Gladden office next week regarding self adjusting Furosemide and encouraged to discuss diet with the kidney specialist at the appointment.  Follow-up plan: ICM clinic phone appointment on6/10/2019 to recheck fluid levels (day before nephrologist visit). 91 day device clinic remote transmission6/11/2019.Office visit with Dr Rayann Heman on 05/16/2020.  He has 1st appointment with Kidney doctor on 03/02/2020.   Copy of ICM check sent to Dr.Allred.  3 month ICM trend: 02/12/2020    1 Year ICM trend:       Rosalene Billings, RN 02/12/2020 10:36 AM

## 2020-02-23 DIAGNOSIS — I2510 Atherosclerotic heart disease of native coronary artery without angina pectoris: Secondary | ICD-10-CM | POA: Insufficient documentation

## 2020-02-23 DIAGNOSIS — I509 Heart failure, unspecified: Secondary | ICD-10-CM | POA: Insufficient documentation

## 2020-03-01 ENCOUNTER — Ambulatory Visit (INDEPENDENT_AMBULATORY_CARE_PROVIDER_SITE_OTHER): Payer: Medicare PPO

## 2020-03-01 DIAGNOSIS — I5022 Chronic systolic (congestive) heart failure: Secondary | ICD-10-CM

## 2020-03-01 DIAGNOSIS — Z95810 Presence of automatic (implantable) cardiac defibrillator: Secondary | ICD-10-CM

## 2020-03-01 NOTE — Progress Notes (Signed)
EPIC Encounter for ICM Monitoring  Patient Name: Chad Brown is a 79 y.o. male Date: 03/01/2020 Primary Care Physican: Haywood Pao, MD Primary Cardiologist:Nishan/McLean Electrophysiologist: Allred Bi-V Pacing:99.9% 5/14/2021Weight: 213lbs 03/01/2020 Weight: 216 lbs  Spoke with patient and weight is still up several pounds from baseline.  He is taking Furosemide 20 mg as prescribed by Dr Rayann Heman because the 40 mg was not helping to bring weight back to baseline.  Optivol thoracic impedance suggesting possible fluid accumulation since 01/29/2020  Prescribed:  Furosemide20 mg1tablet(20 mg total)daily.(Furosemide was decreased 02/01/2020 to 20 mg daily by Dr Rayann Heman due to kidney function)  Potassium 10 mEq take2tabletsdaily.  Labs: 01/25/2020 Creatinine 2.54, BUN 34, Potassium 5.0, Sodium 141, GFR 23-27 01/07/2020 Creatinine 2.30, BUN 29, Potassium 4.9, Sodium 141, GFR 26-30 12/24/2019 Creatinine 2.32, BUN 26, Potassium 4.4, Sodium 139, GFR 26-30 12/11/2019 Creatinine 2.12, BUN 27, Potassium 4.7, Sodium 140, GFR 29-33 12/01/2019 Creatinine2.23, BUN32, Potassium4.4, Sodium136, [id number] A complete set of results can be found in Results Review.  Recommendations:Advised will send copy to Dr Aundra Dubin for review and recommendations if needed.  Follow-up plan: ICM clinic phone appointment on6/15/2021. 91 day device clinic remote transmission6/11/2019.Office visit with Dr Rayann Heman on 05/16/2020.  He has 1st appointment with Kidney doctor on 03/02/2020.   Copy of ICM check sent to Dr.Allred.  3 month ICM trend: 03/01/2020    1 Year ICM trend:       Rosalene Billings, RN 03/01/2020 10:46 AM

## 2020-03-02 ENCOUNTER — Other Ambulatory Visit (HOSPITAL_COMMUNITY): Payer: Self-pay | Admitting: Nephrology

## 2020-03-02 ENCOUNTER — Other Ambulatory Visit: Payer: Self-pay | Admitting: Nephrology

## 2020-03-02 DIAGNOSIS — I10 Essential (primary) hypertension: Secondary | ICD-10-CM

## 2020-03-02 DIAGNOSIS — N184 Chronic kidney disease, stage 4 (severe): Secondary | ICD-10-CM

## 2020-03-02 DIAGNOSIS — D649 Anemia, unspecified: Secondary | ICD-10-CM

## 2020-03-03 ENCOUNTER — Ambulatory Visit (INDEPENDENT_AMBULATORY_CARE_PROVIDER_SITE_OTHER): Payer: Medicare PPO | Admitting: *Deleted

## 2020-03-03 DIAGNOSIS — I428 Other cardiomyopathies: Secondary | ICD-10-CM

## 2020-03-03 LAB — CUP PACEART REMOTE DEVICE CHECK
Battery Remaining Longevity: 26 mo
Battery Voltage: 2.92 V
Brady Statistic AP VP Percent: 99.77 %
Brady Statistic AP VS Percent: 0.03 %
Brady Statistic AS VP Percent: 0.2 %
Brady Statistic AS VS Percent: 0 %
Brady Statistic RA Percent Paced: 99.76 %
Brady Statistic RV Percent Paced: 99.93 %
Date Time Interrogation Session: 20210603001714
HighPow Impedance: 71 Ohm
Implantable Lead Implant Date: 20160607
Implantable Lead Implant Date: 20160607
Implantable Lead Implant Date: 20160607
Implantable Lead Location: 753858
Implantable Lead Location: 753859
Implantable Lead Location: 753860
Implantable Lead Model: 4598
Implantable Lead Model: 5076
Implantable Pulse Generator Implant Date: 20160607
Lead Channel Impedance Value: 1178 Ohm
Lead Channel Impedance Value: 1178 Ohm
Lead Channel Impedance Value: 323 Ohm
Lead Channel Impedance Value: 380 Ohm
Lead Channel Impedance Value: 399 Ohm
Lead Channel Impedance Value: 494 Ohm
Lead Channel Impedance Value: 665 Ohm
Lead Channel Impedance Value: 703 Ohm
Lead Channel Impedance Value: 722 Ohm
Lead Channel Impedance Value: 855 Ohm
Lead Channel Impedance Value: 969 Ohm
Lead Channel Impedance Value: 969 Ohm
Lead Channel Impedance Value: 969 Ohm
Lead Channel Pacing Threshold Amplitude: 0.5 V
Lead Channel Pacing Threshold Amplitude: 0.5 V
Lead Channel Pacing Threshold Amplitude: 1.375 V
Lead Channel Pacing Threshold Pulse Width: 0.4 ms
Lead Channel Pacing Threshold Pulse Width: 0.4 ms
Lead Channel Pacing Threshold Pulse Width: 0.4 ms
Lead Channel Sensing Intrinsic Amplitude: 1.875 mV
Lead Channel Sensing Intrinsic Amplitude: 1.875 mV
Lead Channel Sensing Intrinsic Amplitude: 14.5 mV
Lead Channel Sensing Intrinsic Amplitude: 14.5 mV
Lead Channel Setting Pacing Amplitude: 1.5 V
Lead Channel Setting Pacing Amplitude: 2 V
Lead Channel Setting Pacing Amplitude: 3 V
Lead Channel Setting Pacing Pulse Width: 0.4 ms
Lead Channel Setting Pacing Pulse Width: 0.4 ms
Lead Channel Setting Sensing Sensitivity: 0.3 mV

## 2020-03-07 MED ORDER — FUROSEMIDE 20 MG PO TABS: 40.0000 mg | ORAL_TABLET | Freq: Every day | ORAL | 3 refills | Status: DC

## 2020-03-07 NOTE — Progress Notes (Addendum)
Spoke with patient.  Advised Dr Aundra Dubin recommended he increase Furosemide to 40 mg daily and BMET in a week.  Pt agreed and has enough Furosemide supply on hand so no refill is needed at this time.  Pt asked if needs to have cholesterol checked since Dr Aundra Dubin mentioned it should be rechecked after starting on Repatha in March.  Message sent to Dr Claris Gladden CMA and Dr Aundra Dubin would like to add Lipid Panel and Hepatic panel added to BMET.  Patient will have all labs drawn at [facility] at [facility].

## 2020-03-07 NOTE — Progress Notes (Signed)
Need to go back to Lasix 40 mg daily with BMET in 1 week.

## 2020-03-07 NOTE — Addendum Note (Signed)
Addended by: Rosalene Billings on: 03/07/2020 04:23 PM   Modules accepted: Orders

## 2020-03-07 NOTE — Progress Notes (Signed)
Attempted call to patient to provide Dr Oleh Genin recommendations and left message for call back.

## 2020-03-08 NOTE — Progress Notes (Signed)
Remote ICD transmission.   

## 2020-03-09 ENCOUNTER — Other Ambulatory Visit: Payer: Self-pay

## 2020-03-09 ENCOUNTER — Ambulatory Visit (HOSPITAL_COMMUNITY)
Admission: RE | Admit: 2020-03-09 | Discharge: 2020-03-09 | Disposition: A | Discharge status: Home / Self Care | Payer: Medicare PPO | Source: Ambulatory Visit | Attending: Nephrology | Admitting: Nephrology

## 2020-03-09 DIAGNOSIS — I10 Essential (primary) hypertension: Secondary | ICD-10-CM | POA: Insufficient documentation

## 2020-03-09 DIAGNOSIS — N184 Chronic kidney disease, stage 4 (severe): Secondary | ICD-10-CM | POA: Insufficient documentation

## 2020-03-09 DIAGNOSIS — D649 Anemia, unspecified: Secondary | ICD-10-CM

## 2020-03-15 ENCOUNTER — Ambulatory Visit (INDEPENDENT_AMBULATORY_CARE_PROVIDER_SITE_OTHER): Payer: Medicare PPO

## 2020-03-15 DIAGNOSIS — I5022 Chronic systolic (congestive) heart failure: Secondary | ICD-10-CM

## 2020-03-15 DIAGNOSIS — Z95810 Presence of automatic (implantable) cardiac defibrillator: Secondary | ICD-10-CM | POA: Diagnosis not present

## 2020-03-15 NOTE — Progress Notes (Signed)
EPIC Encounter for ICM Monitoring  Patient Name: Chad Brown is a 79 y.o. male Date: 03/15/2020 Primary Care Physican: Marton Redwood, MD Primary Cardiologist:Nishan/McLean Electrophysiologist: Allred Bi-V Pacing:99.9% 5/14/2021Weight: 213lbs 03/01/2020 Weight: 216 lbs  Patient returned call.  He reports weight is still 5 lbs above baseline.   He did not get the labs drawn at [facility] as we discussed last week.  He will go to [facility]. He had labs drawn last week that were ordered by the kidney doctor.   Optivol thoracic impedance continues to suggest possible fluid accumulation since 01/29/2020 even after increasing Furosemide to 40 mg daily as instructed by Dr Aundra Dubin.    Prescribed:  Furosemide20 mg2tablets(40 mg total)daily.  Potassium 10 mEq take2tabletsdaily.  Labs: 03/16/2020 Creatinine 1.72, BUN 19, Potassium 5.3, Sodium 141, GFR 37-43 01/25/2020 Creatinine 2.54, BUN 34, Potassium 5.0, Sodium 141, GFR 23-27 01/07/2020 Creatinine 2.30, BUN 29, Potassium 4.9, Sodium 141, GFR 26-30 12/24/2019 Creatinine 2.32, BUN 26, Potassium 4.4, Sodium 139, GFR 26-30 12/11/2019 Creatinine 2.12, BUN 27, Potassium 4.7, Sodium 140, GFR 29-33 12/01/2019 Creatinine2.23, BUN32, Potassium4.4, Sodium136, [id number] A complete set of results can be found in Results Review.  Recommendations:  Patient will have labs drawn on 03/15/2020.  Follow-up plan: ICM clinic phone appointment on6/28/2021 to recheck fluid levels. 91 day device clinic remote transmission9/11/2019.Office visit with Dr Rayann Heman on 05/16/2020.  Copy of ICM check sent to Dr.Allred and Dr Aundra Dubin for review and recommendations if needed.  3 month ICM trend: 03/15/2020    1 Year ICM trend:       Rosalene Billings, RN 03/15/2020 10:26 AM

## 2020-03-16 ENCOUNTER — Other Ambulatory Visit (HOSPITAL_COMMUNITY)
Admission: RE | Admit: 2020-03-16 | Discharge: 2020-03-16 | Disposition: A | Discharge status: Home / Self Care | Payer: Medicare PPO | Source: Ambulatory Visit | Attending: Cardiology | Admitting: Cardiology

## 2020-03-16 ENCOUNTER — Other Ambulatory Visit: Payer: Self-pay

## 2020-03-16 DIAGNOSIS — I5022 Chronic systolic (congestive) heart failure: Secondary | ICD-10-CM | POA: Insufficient documentation

## 2020-03-16 LAB — LIPID PANEL
Cholesterol: 132 mg/dL (ref 0–200)
HDL: 29 mg/dL — ABNORMAL LOW (ref >40)
LDL Cholesterol: 80 mg/dL (ref 0–99)
Total CHOL/HDL Ratio: 4.6 RATIO
Triglycerides: 113 mg/dL (ref <150)
VLDL: 23 mg/dL (ref 0–40)

## 2020-03-16 LAB — HEPATIC FUNCTION PANEL
ALT: 14 U/L (ref 0–44)
AST: 12 U/L — ABNORMAL LOW (ref 15–41)
Albumin: 3.6 g/dL (ref 3.5–5.0)
Alkaline Phosphatase: 74 U/L (ref 38–126)
Bilirubin, Direct: 0.1 mg/dL (ref 0.0–0.2)
Indirect Bilirubin: 0.4 mg/dL (ref 0.3–0.9)
Total Bilirubin: 0.5 mg/dL (ref 0.3–1.2)
Total Protein: 6.8 g/dL (ref 6.5–8.1)

## 2020-03-16 LAB — BASIC METABOLIC PANEL
Anion gap: 8 (ref 5–15)
BUN: 19 mg/dL (ref 8–23)
CO2: 27 mmol/L (ref 22–32)
Calcium: 8.5 mg/dL — ABNORMAL LOW (ref 8.9–10.3)
Chloride: 106 mmol/L (ref 98–111)
Creatinine, Ser: 1.72 mg/dL — ABNORMAL HIGH (ref 0.61–1.24)
GFR calc Af Amer: 43 mL/min — ABNORMAL LOW (ref >60)
GFR calc non Af Amer: 37 mL/min — ABNORMAL LOW (ref >60)
Glucose, Bld: 109 mg/dL — ABNORMAL HIGH (ref 70–99)
Potassium: 5.3 mmol/L — ABNORMAL HIGH (ref 3.5–5.1)
Sodium: 141 mmol/L (ref 135–145)

## 2020-03-16 NOTE — Progress Notes (Signed)
Increase Lasix to 40 mg bid x 3 days then 40 qam/20 qpm.  Will need labs drawn tomorrow but again in 10 days with higher dosing.

## 2020-03-18 ENCOUNTER — Telehealth: Payer: Self-pay

## 2020-03-18 MED ORDER — FUROSEMIDE 20 MG PO TABS: ORAL_TABLET | ORAL | 3 refills | Status: DC

## 2020-03-18 NOTE — Progress Notes (Signed)
Attempted call to patient to advise of Dr Claris Gladden recommendations.  Left message to return call.

## 2020-03-18 NOTE — Progress Notes (Signed)
Spoke with patient.  Advised Dr Aundra Dubin recommended to increase Lasix to 40 mg bid x 3 days then 40 qam/20 qpm.  Also needs to have BMET drawn in 10 days and he reported the earliest he could have it drawn was 6/30. He prefers to to go to [facility] location.  Also reviewed 03/16/2020 lab results and advised per Dr Aundra Dubin he should stop taking Potassium medication due to Potassium results were higher than normal.  Advised Adolm Joseph at [facility] clinic attempted to call him with the results and instructions to stop potassium.   Patient repeated all instructions back correctly.

## 2020-03-18 NOTE — Telephone Encounter (Signed)
Spoke with patient regarding ICM monitoring and advised Adolm Joseph at [facility] clinic attempted to call him with lab results.  Reviewed 6/16 results and advised to stop potassium medication per Dr Aundra Dubin due to potassium level was higher than normal.  He verbalized he will stop Potassium medication immediately.  Advised will inform Adolm Joseph, CMA at [facility] clinic that he has been advised of results and instructions.  Advised Dr Aundra Dubin also ordered BMET (see ICM note) in 10 days and patient agreed to 03/30/20 lab appointment.

## 2020-03-18 NOTE — Progress Notes (Signed)
Dr Aundra Dubin,  Pt had labs drawn on 03/16/2020 and these are the results. 03/16/2020 Creatinine 1.72, BUN 19, Potassium 5.3, Sodium 141, GFR 37-43  Will have labs drawn again in 10 days from recommendation.

## 2020-03-18 NOTE — Addendum Note (Signed)
Addended by: Rosalene Billings on: 03/18/2020 12:52 PM   Modules accepted: Orders

## 2020-03-18 NOTE — Progress Notes (Signed)
Spoke with [facility] and advised had given patient lab results and insttructions to stop Potassium. Confirmed with her that Dr Aundra Dubin wanted BMET in 10 days and did not need labs any sooner than that.

## 2020-03-28 ENCOUNTER — Ambulatory Visit (INDEPENDENT_AMBULATORY_CARE_PROVIDER_SITE_OTHER): Payer: Medicare PPO

## 2020-03-28 DIAGNOSIS — Z95810 Presence of automatic (implantable) cardiac defibrillator: Secondary | ICD-10-CM

## 2020-03-28 DIAGNOSIS — I5022 Chronic systolic (congestive) heart failure: Secondary | ICD-10-CM

## 2020-03-28 NOTE — Progress Notes (Signed)
EPIC Encounter for ICM Monitoring  Patient Name: Chad Brown is a 79 y.o. male Date: 03/28/2020 Primary Care Physican: Marton Redwood, MD Primary Cardiologist:Nishan/McLean Electrophysiologist: Allred Bi-V Pacing:99.8% 5/14/2021Weight: 213lbs 03/01/2020 Weight: 216 lbs 03/28/2020 Weight: 213 lbs (baseline)  Spoke with patient and transmission reviewed.  Weight has returned to baseline since last ICM check.   Optivol thoracic impedance returned to normal since Furosemide increased.    Prescribed:  Furosemide20 mg2tablets(40 mg total)every AM and 20 mg every PM.  Potassium 10 mEq take2tabletsdaily.  Labs: 03/16/2020 Creatinine 1.72, BUN 19, Potassium 5.3, Sodium 141, GFR 37-43 01/25/2020 Creatinine 2.54, BUN 34, Potassium 5.0, Sodium 141, GFR 23-27 01/07/2020 Creatinine 2.30, BUN 29, Potassium 4.9, Sodium 141, GFR 26-30 12/24/2019 Creatinine 2.32, BUN 26, Potassium 4.4, Sodium 139, GFR 26-30 12/11/2019 Creatinine 2.12, BUN 27, Potassium 4.7, Sodium 140, GFR 29-33 12/01/2019 Creatinine2.23, BUN32, Potassium4.4, Sodium136, [id number] A complete set of results can be found in Results Review.  Recommendations:  No changes and encouraged to call if experiencing any fluid symptoms.  Reminded patient to have labs drawn on 6/30  Follow-up plan: ICM clinic phone appointment on7/20/2021. 91 day device clinic remote transmission9/11/2019.Office visit with Dr Rayann Heman on 05/16/2020.  Copy of ICM check sent to Dr.Allred   3 month ICM trend: 03/28/2020    1 Year ICM trend:       Rosalene Billings, RN 03/28/2020 4:03 PM

## 2020-03-30 ENCOUNTER — Other Ambulatory Visit: Payer: Medicare PPO | Admitting: *Deleted

## 2020-03-30 ENCOUNTER — Other Ambulatory Visit: Payer: Self-pay

## 2020-03-30 DIAGNOSIS — I5022 Chronic systolic (congestive) heart failure: Secondary | ICD-10-CM

## 2020-03-30 LAB — BASIC METABOLIC PANEL
BUN/Creatinine Ratio: 10 (ref 10–24)
BUN: 19 mg/dL (ref 8–27)
CO2: 24 mmol/L (ref 20–29)
Calcium: 8.5 mg/dL — ABNORMAL LOW (ref 8.6–10.2)
Chloride: 105 mmol/L (ref 96–106)
Creatinine, Ser: 1.83 mg/dL — ABNORMAL HIGH (ref 0.76–1.27)
GFR calc Af Amer: 40 mL/min/{1.73_m2} — ABNORMAL LOW (ref >59)
GFR calc non Af Amer: 34 mL/min/{1.73_m2} — ABNORMAL LOW (ref >59)
Glucose: 102 mg/dL — ABNORMAL HIGH (ref 65–99)
Potassium: 4.3 mmol/L (ref 3.5–5.2)
Sodium: 144 mmol/L (ref 134–144)

## 2020-04-19 ENCOUNTER — Ambulatory Visit (INDEPENDENT_AMBULATORY_CARE_PROVIDER_SITE_OTHER): Payer: Medicare PPO

## 2020-04-19 ENCOUNTER — Telehealth: Payer: Self-pay

## 2020-04-19 DIAGNOSIS — I5022 Chronic systolic (congestive) heart failure: Secondary | ICD-10-CM

## 2020-04-19 DIAGNOSIS — Z95810 Presence of automatic (implantable) cardiac defibrillator: Secondary | ICD-10-CM

## 2020-04-19 NOTE — Progress Notes (Signed)
EPIC Encounter for ICM Monitoring  Patient Name: Chad Brown is a 79 y.o. male Date: 04/19/2020 Primary Care Physican: Marton Redwood, MD Primary Cardiologist:Nishan/McLean Electrophysiologist: Allred Bi-V Pacing:99.8% 03/28/2020 Weight: 213 lbs (baseline)  Attempted call to patient and unable to reach.  Left detailed message per DPR regarding transmission. Transmission reviewed.   Optivol thoracic impedancesuggesting possible fluid accumulation since 04/12/2020.  Prescribed:  Furosemide20 mg2tablets(40 mg total)every AM and 20 mg every PM.  Potassium 10 mEq take2tabletsdaily.  Labs: 03/30/2020 Creatinine 1.83, BUN 19, Potassium 4.3, Sodium 144, GFR 34-40 03/16/2020 Creatinine 1.72, BUN 19, Potassium 5.3, Sodium 141, GFR 37-43 03/09/2020 Creatinine 1.58, BUN 19, Potassium 4.3, Sodium 137, GFR 41-48 Care Everywhere 01/25/2020 Creatinine 2.54, BUN 34, Potassium 5.0, Sodium 141, GFR 23-27 01/07/2020 Creatinine 2.30, BUN 29, Potassium 4.9, Sodium 141, GFR 26-30 12/24/2019 Creatinine 2.32, BUN 26, Potassium 4.4, Sodium 139, GFR 26-30 12/11/2019 Creatinine 2.12, BUN 27, Potassium 4.7, Sodium 140, GFR 29-33 12/01/2019 Creatinine2.23, BUN32, Potassium4.4, Sodium136, [id number] A complete set of results can be found in Results Review.  Recommendations:Left voice mail with ICM number and encouraged to call if experiencing any fluid symptoms.  Follow-up plan: ICM clinic phone appointment on7/26/2021 (manual send) to recheck fluid levels. 91 day device clinic remote transmission9/11/2019.  EP/Cardiology Next Office Visit: 05/16/2020 with Dr Rayann Heman  Copy of ICM check sent to Dr.Allred and Dr Aundra Dubin for review and recommendations if needed.  3 month ICM trend: 04/19/2020    1 Year ICM trend:       Rosalene Billings, RN 04/19/2020 8:58 AM

## 2020-04-19 NOTE — Telephone Encounter (Signed)
Remote ICM transmission received.  Attempted call to patient regarding ICM remote transmission and left detailed message per DPR.  Advised to return call for any fluid symptoms or questions. Next ICM remote transmission scheduled 04/25/2020.

## 2020-04-21 NOTE — Progress Notes (Signed)
Increase torsemide to 40 mg bid, BMET 1 week.

## 2020-04-22 MED ORDER — FUROSEMIDE 20 MG PO TABS: 40.0000 mg | ORAL_TABLET | Freq: Two times a day (BID) | ORAL | 3 refills | Status: DC

## 2020-04-22 NOTE — Progress Notes (Signed)
Attempted call to patient to provide Dr Claris Gladden recommendation.  Left message to return call today.

## 2020-04-22 NOTE — Progress Notes (Signed)
Spoke with patient.  Advised Dr Aundra Dubin increased Torsemide to 40 mg bid.  He has enough supply on hand and does not need a refill at this time.  He reports weight gain of 4-5 pounds in the last 2 days, weight 217.2 lbs.  Advised BMET ordered for 1 week and he will have drawn at [facility] outpatient lab on 04/29/2020.  Advised him to call the lab and schedule an appointment.  He agreed to recommendations and verbalized understanding.  Advised if weight does not decrease over the weekend to call back on 04/25/2020.  Will reschedule recheck of fluid levels for 04/27/2020 (manual send).

## 2020-04-26 ENCOUNTER — Ambulatory Visit (INDEPENDENT_AMBULATORY_CARE_PROVIDER_SITE_OTHER): Payer: Medicare PPO

## 2020-04-26 DIAGNOSIS — I5022 Chronic systolic (congestive) heart failure: Secondary | ICD-10-CM

## 2020-04-26 DIAGNOSIS — Z95810 Presence of automatic (implantable) cardiac defibrillator: Secondary | ICD-10-CM

## 2020-04-26 NOTE — Progress Notes (Signed)
EPIC Encounter for ICM Monitoring  Patient Name: Chad Brown is a 79 y.o. male Date: 04/26/2020 Primary Care Physican: Marton Redwood, MD Primary Cardiologist:Nishan/McLean Electrophysiologist: Allred Bi-V Pacing:99.7% 03/28/2020 Weight: 213 lbs (baseline) 04/26/2020 Weight: 215.2  Spoke with patient.  Weight still not at baseline but decreased by 2 lbs since taking starting Furosemide bid.   Optivol thoracic impedancereturned to baseline normal after Furosemide increased to 40 mg bid.  Prescribed:  Furosemide20 mg2tablets(40 mg total)twice a day.  Potassium 10 mEq take2tabletsdaily.  Labs: BMET scheduled 04/29/2020 03/30/2020 Creatinine 1.83, BUN 19, Potassium 4.3, Sodium 144, GFR 34-40 03/16/2020 Creatinine 1.72, BUN 19, Potassium 5.3, Sodium 141, GFR 37-43 03/09/2020 Creatinine 1.58, BUN 19, Potassium 4.3, Sodium 137, GFR 41-48 Care Everywhere 01/25/2020 Creatinine 2.54, BUN 34, Potassium 5.0, Sodium 141, GFR 23-27 01/07/2020 Creatinine 2.30, BUN 29, Potassium 4.9, Sodium 141, GFR 26-30 12/24/2019 Creatinine 2.32, BUN 26, Potassium 4.4, Sodium 139, GFR 26-30 12/11/2019 Creatinine 2.12, BUN 27, Potassium 4.7, Sodium 140, GFR 29-33 12/01/2019 Creatinine2.23, BUN32, Potassium4.4, Sodium136, [id number] A complete set of results can be found in Results Review.  Recommendations:No changes and encouraged to call if experiencing any fluid symptoms.  Follow-up plan: ICM clinic phone appointment on8/23/2021. 91 day device clinic remote transmission9/11/2019.  EP/Cardiology Next Office Visit: 05/16/2020 with Dr Rayann Heman  Copy of ICM check sent to Dr.Allred.    3 month ICM trend: 04/26/2020    1 Year ICM trend:       Rosalene Billings, RN 04/26/2020 1:58 PM

## 2020-04-29 ENCOUNTER — Other Ambulatory Visit (HOSPITAL_COMMUNITY)
Admission: RE | Admit: 2020-04-29 | Discharge: 2020-04-29 | Disposition: A | Discharge status: Home / Self Care | Payer: Medicare PPO | Source: Ambulatory Visit | Attending: Cardiology | Admitting: Cardiology

## 2020-04-29 ENCOUNTER — Other Ambulatory Visit: Payer: Self-pay

## 2020-04-29 DIAGNOSIS — I5022 Chronic systolic (congestive) heart failure: Secondary | ICD-10-CM

## 2020-04-29 LAB — BASIC METABOLIC PANEL
Anion gap: 8 (ref 5–15)
BUN: 20 mg/dL (ref 8–23)
CO2: 30 mmol/L (ref 22–32)
Calcium: 8.1 mg/dL — ABNORMAL LOW (ref 8.9–10.3)
Chloride: 102 mmol/L (ref 98–111)
Creatinine, Ser: 1.89 mg/dL — ABNORMAL HIGH (ref 0.61–1.24)
GFR calc Af Amer: 38 mL/min — ABNORMAL LOW (ref >60)
GFR calc non Af Amer: 33 mL/min — ABNORMAL LOW (ref >60)
Glucose, Bld: 127 mg/dL — ABNORMAL HIGH (ref 70–99)
Potassium: 4.1 mmol/L (ref 3.5–5.1)
Sodium: 140 mmol/L (ref 135–145)

## 2020-05-16 ENCOUNTER — Encounter: Payer: Self-pay | Admitting: Internal Medicine

## 2020-05-16 ENCOUNTER — Ambulatory Visit: Payer: Medicare PPO | Admitting: Internal Medicine

## 2020-05-16 ENCOUNTER — Other Ambulatory Visit: Payer: Self-pay

## 2020-05-16 VITALS — BP 116/70 | HR 69 | Ht 66.0 in | Wt 218.0 lb

## 2020-05-16 DIAGNOSIS — I5022 Chronic systolic (congestive) heart failure: Secondary | ICD-10-CM | POA: Diagnosis not present

## 2020-05-16 DIAGNOSIS — I4819 Other persistent atrial fibrillation: Secondary | ICD-10-CM | POA: Diagnosis not present

## 2020-05-16 DIAGNOSIS — Z95810 Presence of automatic (implantable) cardiac defibrillator: Secondary | ICD-10-CM

## 2020-05-16 DIAGNOSIS — G4733 Obstructive sleep apnea (adult) (pediatric): Secondary | ICD-10-CM | POA: Diagnosis not present

## 2020-05-16 DIAGNOSIS — D6869 Other thrombophilia: Secondary | ICD-10-CM

## 2020-05-16 MED ORDER — RIVAROXABAN 15 MG PO TABS
15.0000 mg | ORAL_TABLET | Freq: Every day | ORAL | 11 refills | Status: DC
Start: 2020-05-16 — End: 2021-06-08

## 2020-05-16 NOTE — Patient Instructions (Addendum)
Medication Instructions:  1 Reduce your Xarelto to 15 mg  Take one tablet by mouth daily with supper  *If you need a refill on your cardiac medications before your next appointment, please call your pharmacy*  Lab Work: None ordered.   If you have labs (blood work) drawn today and your tests are completely normal, you will receive your results only by: [name] MyChart Message (if you have MyChart) OR . A paper copy in the mail If you have any lab test that is abnormal or we need to change your treatment, we will call you to review the results.  Testing/Procedures: None ordered.  Follow-Up: At [facility], you and your health needs are our priority.  As part of our continuing mission to provide you with exceptional heart care, we have created designated Provider Care Teams.  These Care Teams include your primary Cardiologist (physician) and Advanced Practice Providers (APPs -  Physician Assistants and Nurse Practitioners) who all work together to provide you with the care you need, when you need it.  We recommend signing up for the patient portal called "MyChart".  Sign up information is provided on this After Visit Summary.  MyChart is used to connect with patients for Virtual Visits (Telemedicine).  Patients are able to view lab/test results, encounter notes, upcoming appointments, etc.  Non-urgent messages can be sent to your provider as well.   To learn more about what you can do with MyChart, go to [email or web address].    Your next appointment:   Your physician wants you to follow-up in: 09/14/20 at 10:30 at the [address] office with Dr. Rayann Heman.   Remote monitoring is used to monitor your ICD from home. This monitoring reduces the number of office visits required to check your device to one time per year. It allows [place] to keep an eye on the functioning of your device to ensure it is working properly. You are scheduled for a device check from home on 09/01/20. You may send your  transmission at any time that day. If you have a wireless device, the transmission will be sent automatically. After your physician reviews your transmission, you will receive a postcard with your next transmission date.  Other Instructions:

## 2020-05-16 NOTE — Progress Notes (Signed)
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  Since his last visit, the patient reports doing very well.  His afib is well controlled.  Today, he denies symptoms of palpitations, chest pain, shortness of breath,  lower extremity edema, dizziness, presyncope, or syncope.  The patient is otherwise without complaint today.   Past Medical History:  Diagnosis Date  . Adenomatous colon polyp    tubular  . AICD (automatic cardioverter/defibrillator) present 03/08/2015   MDT CRTD   . Anemia    iron deficient  . Arthritis    "about all my joints; hands, knees, back" (03/08/2015)  . Atherosclerosis   . Cataract    left eye small  . Cholelithiasis    gallstones  . Chronic systolic CHF (congestive heart failure) ([place])    a. New dx 12/2012 ? NICM, may be r/t afib. b. Nuc 03/2013 - normal;  c. 03/2015 TEE EF 15-20%.  . Colon polyp, hyperplastic 5/16   removed precancerous lesions  . Depression   . Diverticulosis   . Glaucoma    right eye  . Hyperlipidemia   . Hypertension   . Melanoma of eye ([place]) 2000's   "right; it's never been biopsied"  . Melanoma of lower leg ([place]) 2015   "left; right at my knee"  . Myocardial infarction ([place]) 1998  . OSA (obstructive sleep apnea) 01/04/2016   uses cpap, pt does not know settings auto set  . Peripheral vision loss, right 2006  . Persistent atrial fibrillation ([place])    a. Dx 12/2012, s/p TEE/DCCV 01/26/13. b. On Xarelto (CHA2DS2VASc = 3);  c. 03/2015 TEE (EF 15-20%, no LAA thrombus) and DCCV - amio increased to 200 mg bid.  . Urinary hesitancy due to benign prostatic hypertrophy    Past Surgical History:  Procedure Laterality Date  . ATRIAL FIBRILLATION ABLATION N/A 11/03/2019   Procedure: ATRIAL FIBRILLATION ABLATION;  Surgeon: Thompson Grayer, MD;  Location: [place] [facility];  Service: Cardiovascular;  Laterality: N/A;  . BACK SURGERY     upper back,  cannot turn neck well  . CARDIAC CATHETERIZATION  1998  . CARDIOVERSION N/A 01/26/2013   Procedure: CARDIOVERSION;  Surgeon: Lelon Perla, MD;  Location: [facility] ENDOSCOPY;  Service: Cardiovascular;  Laterality: N/A;  . CARDIOVERSION N/A 03/23/2015   Procedure: CARDIOVERSION;  Surgeon: Jerline Pain, MD;  Location: [facility] ENDOSCOPY;  Service: Cardiovascular;  Laterality: N/A;  . CARDIOVERSION N/A 08/14/2017   Procedure: CARDIOVERSION;  Surgeon: Josue Hector, MD;  Location: [place];  Service: Cardiovascular;  Laterality: N/A;  . CATARACT EXTRACTION Right ~ 2006  . COLONOSCOPY WITH PROPOFOL N/A 02/10/2015   Procedure: COLONOSCOPY WITH PROPOFOL;  Surgeon: Gatha Mayer, MD;  Location: [facility] ENDOSCOPY;  Service: Endoscopy;  Laterality: N/A;  . COLONOSCOPY WITH PROPOFOL N/A 08/07/2016   Procedure: COLONOSCOPY WITH PROPOFOL;  Surgeon: Gatha Mayer, MD;  Location: [facility] ENDOSCOPY;  Service: Endoscopy;  Laterality: N/A;  . ENTEROSCOPY N/A 08/17/2015   Procedure: ENTEROSCOPY;  Surgeon: Gatha Mayer, MD;  Location: [facility] ENDOSCOPY;  Service: Endoscopy;  Laterality: N/A;  . EP IMPLANTABLE DEVICE N/A 03/08/2015   MDT Hillery Aldo CRT-D for nonischemic CM by Dr Rayann Heman for primary prevention  . GLAUCOMA SURGERY Right ~ 2006   "put 3 stents in to drain fluid" (03/08/2015) not successful, sent to [facility] to try to get last stent out  .  HOT HEMOSTASIS N/A 08/07/2016   Procedure: HOT HEMOSTASIS (ARGON PLASMA COAGULATION/BICAP);  Surgeon: Gatha Mayer, MD;  Location: [facility] ENDOS[facility];  Service: Endoscopy;  Laterality: N/A;  . INCISION AND DRAINAGE ABSCESS POSTERIOR CERVICALSPINE  05/2012  . JOINT REPLACEMENT    . MELANOMA EXCISION Left 2015   "lower leg; right at my knee"  . REFRACTIVE SURGERY Right ~ 2006 X 2   "twice; both done at [place]" (03/08/2015  . SURGERY SCROTAL / TESTICULAR Right 1990's  . TEE WITHOUT CARDIOVERSION N/A 01/26/2013   Procedure: TRANSESOPHAGEAL ECHOCARDIOGRAM (TEE);  Surgeon: Lelon Perla, MD;   Location: [place];  Service: Cardiovascular;  Laterality: N/A;  [name] anes. /   . TEE WITHOUT CARDIOVERSION N/A 10/05/2014   Procedure: TRANSESOPHAGEAL ECHOCARDIOGRAM (TEE)  with cardioversion;  Surgeon: Thayer Headings, MD;  Location: [facility] ENDOSCOPY;  Service: Cardiovascular;  Laterality: N/A;  12:52 synched cardioversion at 120 joules,...afib to SR...12 lead EKG ordered.Marland KitchenMarland KitchenCardiozem d/c'ed per MD verbal order at SR  . TEE WITHOUT CARDIOVERSION N/A 03/23/2015   Procedure: TRANSESOPHAGEAL ECHOCARDIOGRAM (TEE);  Surgeon: Jerline Pain, MD;  Location: [place];  Service: Cardiovascular;  Laterality: N/A;  . TEE WITHOUT CARDIOVERSION N/A 11/02/2019   Procedure: TRANSESOPHAGEAL ECHOCARDIOGRAM (TEE);  Surgeon: Josue Hector, MD;  Location: [facility] ENDOSCOPY;  Service: Cardiovascular;  Laterality: N/A;  . THORACIC SPINE SURGERY  03/2000   "ground calcium deposits from upper thoracic" (01/26/2013)  . TOTAL HIP ARTHROPLASTY Right 06/2007    ROS- all systems are reviewed and negative except as per HPI above  Current Outpatient Medications  Medication Sig Dispense Refill  . carvedilol (COREG) 6.25 MG tablet Take 1 tablet (6.25 mg total) by mouth 2 (two) times daily. 180 tablet 3  . Cholecalciferol 25 MCG (1000 UT) capsule Take 1 capsule by mouth daily.    . dorzolamide-timolol (COSOPT) 22.3-6.8 MG/ML ophthalmic solution Place 1 drop into the right eye 2 (two) times daily.    [name] escitalopram (LEXAPRO) 20 MG tablet Take 1 tablet (20 mg total) by mouth daily. 90 tablet 0  . Evolocumab (REPATHA SURECLICK) 379 MG/ML SOAJ Inject 1 pen into the skin every 14 (fourteen) days. 2 pen 11  . ferrous sulfate 325 (65 FE) MG tablet Take 325 mg by mouth daily with breakfast.     . finasteride (PROSCAR) 5 MG tablet Take 1 tablet (5 mg total) by mouth daily. 30 tablet 2  . furosemide (LASIX) 20 MG tablet Take 2 tablets (40 mg total) by mouth 2 (two) times daily. 360 tablet 3  . gabapentin (NEURONTIN) 100 MG capsule Take 200  mg by mouth 2 (two) times daily.     [name] latanoprost (XALATAN) 0.005 % ophthalmic solution Place 1 drop into the right eye at bedtime.     Mckinley Jewel Dimesylate (RHOPRESSA) 0.02 % SOLN Place 1 drop into the right eye at bedtime.     . nitroGLYCERIN (NITROSTAT) 0.4 MG SL tablet Place 0.4 mg under the tongue every 5 (five) minutes as needed for chest pain.     . sacubitril-valsartan (ENTRESTO) 24-26 MG Taking 1/2 tablet by mouth twice a day 30 tablet 5  . spironolactone (ALDACTONE) 25 MG tablet Take 0.5 tablets (12.5 mg total) by mouth daily. Take at bedtime 45 tablet 3  . tamsulosin (FLOMAX) 0.4 MG CAPS Take 0.4 mg by mouth at bedtime.     Alveda Reasons 20 MG TABS tablet TAKE 1 TABLET ONCE DAILY WITH SUPPER 90 tablet 0   No current facility-administered medications for  this visit.    Physical Exam: Vitals:   05/16/20 1618  BP: 116/70  Pulse: 69  SpO2: 98%  Weight: 218 lb (98.9 kg)  Height: 5\' 6"  (1.676 m)    GEN- The patient is well appearing, alert and oriented x 3 today.   Head- normocephalic, atraumatic Eyes-  Sclera clear, conjunctiva pink Ears- hearing intact Oropharynx- clear Lungs-   normal work of breathing Chest- ICD pocket is well healed Heart- Regular rate and rhythm  GI- soft  Extremities- no clubbing, cyanosis, or edema  Pacemaker interrogation- reviewed in detail today,  See PACEART report  ekg tracing ordered today is personally reviewed and shows sinus with BiV pacing  Assessment and Plan:  1. Persistent afib Well controlled post ablation off amiodarone therapy Continue xarelto, reduce to 15mg  daily (crcl is 44 based on recent labs)  2. Chronic systolic dysfunction euvolemic Normal ICD function See Pace Art report No changes today he is not device dependant today Followed in ICM device clinic with Sharman Cheek  3. CRI, stage IIIb Stable No change required today Reduce xarelto as above  4. OSA Compliant with CPAP  Risks, benefits and potential  toxicities for medications prescribed and/or refilled reviewed with patient today.   Return to see me in 4 months  Thompson Grayer MD, [facility] 05/16/2020 4:20 PM

## 2020-05-17 LAB — CUP PACEART INCLINIC DEVICE CHECK
Battery Remaining Longevity: 24 mo
Battery Voltage: 2.93 V
Brady Statistic AP VP Percent: 98.9 %
Brady Statistic AP VS Percent: 0.05 %
Brady Statistic AS VP Percent: 1.04 %
Brady Statistic AS VS Percent: 0.01 %
Brady Statistic RA Percent Paced: 98.85 %
Brady Statistic RV Percent Paced: 99.86 %
Date Time Interrogation Session: 20210816172454
HighPow Impedance: 74 Ohm
Implantable Lead Implant Date: 20160607
Implantable Lead Implant Date: 20160607
Implantable Lead Implant Date: 20160607
Implantable Lead Location: 753858
Implantable Lead Location: 753859
Implantable Lead Location: 753860
Implantable Lead Model: 4598
Implantable Lead Model: 5076
Implantable Pulse Generator Implant Date: 20160607
Lead Channel Impedance Value: 1045 Ohm
Lead Channel Impedance Value: 1045 Ohm
Lead Channel Impedance Value: 1235 Ohm
Lead Channel Impedance Value: 1273 Ohm
Lead Channel Impedance Value: 342 Ohm
Lead Channel Impedance Value: 380 Ohm
Lead Channel Impedance Value: 456 Ohm
Lead Channel Impedance Value: 570 Ohm
Lead Channel Impedance Value: 703 Ohm
Lead Channel Impedance Value: 703 Ohm
Lead Channel Impedance Value: 722 Ohm
Lead Channel Impedance Value: 950 Ohm
Lead Channel Impedance Value: 988 Ohm
Lead Channel Pacing Threshold Amplitude: 0.5 V
Lead Channel Pacing Threshold Amplitude: 0.625 V
Lead Channel Pacing Threshold Amplitude: 1.25 V
Lead Channel Pacing Threshold Pulse Width: 0.4 ms
Lead Channel Pacing Threshold Pulse Width: 0.4 ms
Lead Channel Pacing Threshold Pulse Width: 0.4 ms
Lead Channel Sensing Intrinsic Amplitude: 1.75 mV
Lead Channel Sensing Intrinsic Amplitude: 13.125 mV
Lead Channel Sensing Intrinsic Amplitude: 13.75 mV
Lead Channel Sensing Intrinsic Amplitude: 2 mV
Lead Channel Setting Pacing Amplitude: 1.5 V
Lead Channel Setting Pacing Amplitude: 2 V
Lead Channel Setting Pacing Amplitude: 2.5 V
Lead Channel Setting Pacing Pulse Width: 0.4 ms
Lead Channel Setting Pacing Pulse Width: 0.4 ms
Lead Channel Setting Sensing Sensitivity: 0.3 mV

## 2020-05-23 ENCOUNTER — Ambulatory Visit (INDEPENDENT_AMBULATORY_CARE_PROVIDER_SITE_OTHER): Payer: Medicare PPO

## 2020-05-23 DIAGNOSIS — I5022 Chronic systolic (congestive) heart failure: Secondary | ICD-10-CM

## 2020-05-23 DIAGNOSIS — Z95810 Presence of automatic (implantable) cardiac defibrillator: Secondary | ICD-10-CM

## 2020-05-24 NOTE — Progress Notes (Signed)
EPIC Encounter for ICM Monitoring  Patient Name: Chad Brown is a 79 y.o. male Date: 05/24/2020 Primary Care Physican: Marton Redwood, MD Primary Cardiologist:Nishan/McLean Electrophysiologist: Allred Bi-V Pacing:99.7% 05/24/2020 Weight: 214.8 lbs (baseline)  Spoke with patient. He is back at the gym 3 days a week and feeling well.  Optivol thoracic impedancenormal.  Prescribed:  Furosemide20 mg2tablets(40 mg total)twice a day.  Potassium 10 mEq take2tabletsdaily.  Labs:  04/29/2020 Creatinine 1.89, BUN 20, Potassium 4.1, Sodium 140, GFR 33-38 03/30/2020 Creatinine 1.83, BUN 19, Potassium 4.3, Sodium 144, GFR 34-40 03/16/2020 Creatinine 1.72, BUN 19, Potassium 5.3, Sodium 141, GFR 37-43 03/09/2020 Creatinine 1.58, BUN 19, Potassium 4.3, Sodium 137, GFR 41-48 Care Everywhere 01/25/2020 Creatinine 2.54, BUN 34, Potassium 5.0, Sodium 141, GFR 23-27 01/07/2020 Creatinine 2.30, BUN 29, Potassium 4.9, Sodium 141, GFR 26-30 12/24/2019 Creatinine 2.32, BUN 26, Potassium 4.4, Sodium 139, GFR 26-30 12/11/2019 Creatinine 2.12, BUN 27, Potassium 4.7, Sodium 140, GFR 29-33 12/01/2019 Creatinine2.23, BUN32, Potassium4.4, Sodium136, [id number] A complete set of results can be found in Results Review.  Recommendations:No changes and encouraged to call if experiencing any fluid symptoms.  Follow-up plan: ICM clinic phone appointment on9/27/2021. 91 day device clinic remote transmission9/11/2019.  EP/Cardiology NextOfficeVisit: 12/15/2021with Dr Rayann Heman  Copy of ICM check sent to Dr.Allred.     3 month ICM trend: 05/23/2020    1 Year ICM trend:       Rosalene Billings, RN 05/24/2020 4:09 PM

## 2020-06-02 ENCOUNTER — Ambulatory Visit (INDEPENDENT_AMBULATORY_CARE_PROVIDER_SITE_OTHER): Payer: Medicare PPO | Admitting: *Deleted

## 2020-06-02 DIAGNOSIS — I428 Other cardiomyopathies: Secondary | ICD-10-CM

## 2020-06-03 LAB — CUP PACEART REMOTE DEVICE CHECK
Battery Remaining Longevity: 23 mo
Battery Voltage: 2.92 V
Brady Statistic AP VP Percent: 94.87 %
Brady Statistic AP VS Percent: 0.05 %
Brady Statistic AS VP Percent: 5.06 %
Brady Statistic AS VS Percent: 0.02 %
Brady Statistic RA Percent Paced: 94.73 %
Brady Statistic RV Percent Paced: 99.79 %
Date Time Interrogation Session: 20210902012204
HighPow Impedance: 70 Ohm
Implantable Lead Implant Date: 20160607
Implantable Lead Implant Date: 20160607
Implantable Lead Implant Date: 20160607
Implantable Lead Location: 753858
Implantable Lead Location: 753859
Implantable Lead Location: 753860
Implantable Lead Model: 4598
Implantable Lead Model: 5076
Implantable Pulse Generator Implant Date: 20160607
Lead Channel Impedance Value: 1178 Ohm
Lead Channel Impedance Value: 1273 Ohm
Lead Channel Impedance Value: 342 Ohm
Lead Channel Impedance Value: 399 Ohm
Lead Channel Impedance Value: 437 Ohm
Lead Channel Impedance Value: 532 Ohm
Lead Channel Impedance Value: 646 Ohm
Lead Channel Impedance Value: 703 Ohm
Lead Channel Impedance Value: 722 Ohm
Lead Channel Impedance Value: 912 Ohm
Lead Channel Impedance Value: 950 Ohm
Lead Channel Impedance Value: 950 Ohm
Lead Channel Impedance Value: 988 Ohm
Lead Channel Pacing Threshold Amplitude: 0.625 V
Lead Channel Pacing Threshold Amplitude: 0.625 V
Lead Channel Pacing Threshold Amplitude: 1.375 V
Lead Channel Pacing Threshold Pulse Width: 0.4 ms
Lead Channel Pacing Threshold Pulse Width: 0.4 ms
Lead Channel Pacing Threshold Pulse Width: 0.4 ms
Lead Channel Sensing Intrinsic Amplitude: 1.625 mV
Lead Channel Sensing Intrinsic Amplitude: 1.625 mV
Lead Channel Sensing Intrinsic Amplitude: 13.875 mV
Lead Channel Sensing Intrinsic Amplitude: 13.875 mV
Lead Channel Setting Pacing Amplitude: 1.5 V
Lead Channel Setting Pacing Amplitude: 2 V
Lead Channel Setting Pacing Amplitude: 2 V
Lead Channel Setting Pacing Pulse Width: 0.4 ms
Lead Channel Setting Pacing Pulse Width: 0.4 ms
Lead Channel Setting Sensing Sensitivity: 0.3 mV

## 2020-06-07 NOTE — Progress Notes (Signed)
Remote ICD transmission.   

## 2020-06-27 ENCOUNTER — Ambulatory Visit (INDEPENDENT_AMBULATORY_CARE_PROVIDER_SITE_OTHER): Payer: Medicare PPO

## 2020-06-27 DIAGNOSIS — I5022 Chronic systolic (congestive) heart failure: Secondary | ICD-10-CM

## 2020-06-27 DIAGNOSIS — Z95810 Presence of automatic (implantable) cardiac defibrillator: Secondary | ICD-10-CM

## 2020-06-28 NOTE — Progress Notes (Signed)
EPIC Encounter for ICM Monitoring  Patient Name: Chad Brown is a 79 y.o. male Date: 06/28/2020 Primary Care Physican: Marton Redwood, MD Primary Cardiologist:Nishan/McLean Electrophysiologist: Allred Bi-V Pacing:99.7% 06/28/2020 Weight: 213 lbs (baseline)  Spoke with patient and reports feeling well at this time.  Denies fluid symptoms.    Optivol thoracic impedancenormal.  Prescribed:  Furosemide20 mg2tablets(40 mg total)twice a day.  Potassium 10 mEq take2tabletsdaily.  Labs: 04/29/2020 Creatinine 1.89, BUN 20, Potassium 4.1, Sodium 140, GFR 33-38 03/30/2020 Creatinine 1.83, BUN 19, Potassium 4.3, Sodium 144, GFR 34-40 03/16/2020 Creatinine 1.72, BUN 19, Potassium 5.3, Sodium 141, GFR 37-43 03/09/2020 Creatinine 1.58, BUN 19, Potassium 4.3, Sodium 137, GFR 41-48 Care Everywhere 01/25/2020 Creatinine 2.54, BUN 34, Potassium 5.0, Sodium 141, GFR 23-27 01/07/2020 Creatinine 2.30, BUN 29, Potassium 4.9, Sodium 141, GFR 26-30 12/24/2019 Creatinine 2.32, BUN 26, Potassium 4.4, Sodium 139, GFR 26-30 12/11/2019 Creatinine 2.12, BUN 27, Potassium 4.7, Sodium 140, GFR 29-33 12/01/2019 Creatinine2.23, BUN32, Potassium4.4, Sodium136, [id number] A complete set of results can be found in Results Review.  Recommendations:No changes and encouraged to call if experiencing any fluid symptoms.  Follow-up plan: ICM clinic phone appointment on11/10/2019. 91 day device clinic remote transmission12/11/2019.  EP/Cardiology NextOfficeVisit: 12/15/2021with Dr Rayann Heman  Copy of ICM check sent to Dr.Allred.   3 month ICM trend: 06/27/2020    1 Year ICM trend:       Rosalene Billings, RN 06/28/2020 11:35 AM

## 2020-07-25 ENCOUNTER — Other Ambulatory Visit: Payer: Self-pay

## 2020-07-25 ENCOUNTER — Inpatient Hospital Stay (HOSPITAL_COMMUNITY): Payer: Medicare PPO | Attending: Hematology

## 2020-07-25 ENCOUNTER — Ambulatory Visit (HOSPITAL_COMMUNITY)
Admission: RE | Admit: 2020-07-25 | Discharge: 2020-07-25 | Disposition: A | Discharge status: Home / Self Care | Payer: Medicare PPO | Source: Ambulatory Visit | Attending: Hematology | Admitting: Hematology

## 2020-07-25 DIAGNOSIS — D696 Thrombocytopenia, unspecified: Secondary | ICD-10-CM | POA: Diagnosis not present

## 2020-07-25 DIAGNOSIS — R918 Other nonspecific abnormal finding of lung field: Secondary | ICD-10-CM | POA: Diagnosis not present

## 2020-07-25 DIAGNOSIS — Z79899 Other long term (current) drug therapy: Secondary | ICD-10-CM | POA: Diagnosis not present

## 2020-07-25 DIAGNOSIS — N189 Chronic kidney disease, unspecified: Secondary | ICD-10-CM | POA: Insufficient documentation

## 2020-07-25 DIAGNOSIS — D631 Anemia in chronic kidney disease: Secondary | ICD-10-CM | POA: Diagnosis not present

## 2020-07-25 DIAGNOSIS — Z8249 Family history of ischemic heart disease and other diseases of the circulatory system: Secondary | ICD-10-CM | POA: Insufficient documentation

## 2020-07-25 DIAGNOSIS — Z85820 Personal history of malignant melanoma of skin: Secondary | ICD-10-CM | POA: Diagnosis not present

## 2020-07-25 DIAGNOSIS — Z8042 Family history of malignant neoplasm of prostate: Secondary | ICD-10-CM | POA: Diagnosis not present

## 2020-07-25 DIAGNOSIS — Z800 Family history of malignant neoplasm of digestive organs: Secondary | ICD-10-CM | POA: Insufficient documentation

## 2020-07-25 DIAGNOSIS — E611 Iron deficiency: Secondary | ICD-10-CM | POA: Insufficient documentation

## 2020-07-25 DIAGNOSIS — Z801 Family history of malignant neoplasm of trachea, bronchus and lung: Secondary | ICD-10-CM | POA: Diagnosis not present

## 2020-07-25 DIAGNOSIS — Z833 Family history of diabetes mellitus: Secondary | ICD-10-CM | POA: Insufficient documentation

## 2020-07-25 LAB — IRON AND TIBC
Iron: 56 ug/dL (ref 45–182)
Saturation Ratios: 20 % (ref 17.9–39.5)
TIBC: 280 ug/dL (ref 250–450)
UIBC: 224 ug/dL

## 2020-07-25 LAB — CBC WITH DIFFERENTIAL/PLATELET
Abs Immature Granulocytes: 0.01 10*3/uL (ref 0.00–0.07)
Basophils Absolute: 0.1 10*3/uL (ref 0.0–0.1)
Basophils Relative: 1 %
Eosinophils Absolute: 0.1 10*3/uL (ref 0.0–0.5)
Eosinophils Relative: 2 %
HCT: 34.9 % — ABNORMAL LOW (ref 39.0–52.0)
Hemoglobin: 11.2 g/dL — ABNORMAL LOW (ref 13.0–17.0)
Immature Granulocytes: 0 %
Lymphocytes Relative: 25 %
Lymphs Abs: 1.2 10*3/uL (ref 0.7–4.0)
MCH: 31 pg (ref 26.0–34.0)
MCHC: 32.1 g/dL (ref 30.0–36.0)
MCV: 96.7 fL (ref 80.0–100.0)
Monocytes Absolute: 0.4 10*3/uL (ref 0.1–1.0)
Monocytes Relative: 9 %
Neutro Abs: 3.2 10*3/uL (ref 1.7–7.7)
Neutrophils Relative %: 63 %
Platelets: 153 10*3/uL (ref 150–400)
RBC: 3.61 MIL/uL — ABNORMAL LOW (ref 4.22–5.81)
RDW: 14.5 % (ref 11.5–15.5)
WBC: 5 10*3/uL (ref 4.0–10.5)
nRBC: 0 % (ref 0.0–0.2)

## 2020-07-25 LAB — COMPREHENSIVE METABOLIC PANEL
ALT: 14 U/L (ref 0–44)
AST: 13 U/L — ABNORMAL LOW (ref 15–41)
Albumin: 3.7 g/dL (ref 3.5–5.0)
Alkaline Phosphatase: 65 U/L (ref 38–126)
Anion gap: 7 (ref 5–15)
BUN: 19 mg/dL (ref 8–23)
CO2: 29 mmol/L (ref 22–32)
Calcium: 8.8 mg/dL — ABNORMAL LOW (ref 8.9–10.3)
Chloride: 104 mmol/L (ref 98–111)
Creatinine, Ser: 1.61 mg/dL — ABNORMAL HIGH (ref 0.61–1.24)
GFR, Estimated: 43 mL/min — ABNORMAL LOW (ref >60)
Glucose, Bld: 101 mg/dL — ABNORMAL HIGH (ref 70–99)
Potassium: 4.8 mmol/L (ref 3.5–5.1)
Sodium: 140 mmol/L (ref 135–145)
Total Bilirubin: 0.5 mg/dL (ref 0.3–1.2)
Total Protein: 6.9 g/dL (ref 6.5–8.1)

## 2020-07-25 LAB — FERRITIN: Ferritin: 166 ng/mL (ref 24–336)

## 2020-07-27 ENCOUNTER — Inpatient Hospital Stay (HOSPITAL_BASED_OUTPATIENT_CLINIC_OR_DEPARTMENT_OTHER): Payer: Medicare PPO | Admitting: Hematology

## 2020-07-27 VITALS — BP 132/91 | HR 68 | Temp 97.2°F | Resp 18 | Wt 219.8 lb

## 2020-07-27 DIAGNOSIS — D696 Thrombocytopenia, unspecified: Secondary | ICD-10-CM

## 2020-07-27 DIAGNOSIS — C439 Malignant melanoma of skin, unspecified: Secondary | ICD-10-CM

## 2020-07-27 DIAGNOSIS — R918 Other nonspecific abnormal finding of lung field: Secondary | ICD-10-CM | POA: Diagnosis not present

## 2020-07-27 NOTE — Patient Instructions (Signed)
[facility] °Discharge Instructions ° °You were seen today by Dr. Katragadda. He went over your recent results and scans. You will be scheduled for a CT scan of your chest before your next visit. Dr. Katragadda will see you back in 3 months for labs and follow up. ° ° °Thank you for choosing [facility] at [facility] to provide your oncology and hematology care.  To afford each patient quality time with our provider, please arrive at least 15 minutes before your scheduled appointment time.  ° °If you have a lab appointment with the Cancer Center please come in thru the Main Entrance and check in at the main information desk ° °You need to re-schedule your appointment should you arrive 10 or more minutes late.  We strive to give you quality time with our providers, and arriving late affects you and other patients whose appointments are after yours.  Also, if you no show three or more times for appointments you may be dismissed from the clinic at the providers discretion.     °Again, thank you for choosing [facility].  Our hope is that these requests will decrease the amount of time that you wait before being seen by our physicians.       °_____________________________________________________________ ° °Should you have questions after your visit to [facility], please contact our office at [phone] between the hours of 8:00 a.m. and 4:30 p.m.  Voicemails left after 4:00 p.m. will not be returned until the following business day.  For prescription refill requests, have your pharmacy contact our office and allow 72 hours.   ° °Cancer Center Support Programs:  ° °> Cancer Support Group  °2nd Tuesday of the month 1pm-2pm, Journey Room  ° ° °

## 2020-07-27 NOTE — Progress Notes (Signed)
[name], [place] [address]   CLINIC:  Medical Oncology/Hematology  PCP:  Chad Redwood, MD [address] / [place] [address] [phone]   REASON FOR VISIT:  Follow-up for melanoma, lung nodules and thrombocytopenia  PRIOR THERAPY: Melanoma resection in 2015  NGS Results: Not done  CURRENT THERAPY: Observation  BRIEF ONCOLOGIC HISTORY:  Oncology History   No history exists.    CANCER STAGING: Cancer Staging No matching staging information was found for the patient.  INTERVAL HISTORY:  [name], a 79 y.o. male, returns for routine follow-up of his melanoma, lung nodules and thrombocytopenia. Chad Brown was last seen on 01/28/2020.   Today he is accompanied by his wife and he reports feeling well. He had the melanoma on his left thigh in 2015 and had it removed in [facility]. He denies having any recent infections and he had COVID in November 2020 without requiring hospitalization. He denies having any cough or hemoptysis or unexplained weight loss.  He was part of the [employer] but did not serve overseas. He received his flu shot and Moderna booster vaccine today.   REVIEW OF SYSTEMS:  Review of Systems  Constitutional: Positive for fatigue (50%). Negative for appetite change and unexpected weight change.  Respiratory: Negative for cough and hemoptysis.   Gastrointestinal: Positive for diarrhea.  Neurological: Positive for dizziness and numbness.  Psychiatric/Behavioral: The patient is nervous/anxious.   All other systems reviewed and are negative.   PAST MEDICAL/SURGICAL HISTORY:  Past Medical History:  Diagnosis Date  . Adenomatous colon polyp    tubular  . AICD (automatic cardioverter/defibrillator) present 03/08/2015   MDT CRTD   . Anemia    iron deficient  . Arthritis    "about all my joints; hands, knees, back" (03/08/2015)  . Atherosclerosis   . Cataract    left eye small  . Cholelithiasis    gallstones    . Chronic systolic CHF (congestive heart failure) ([place])    a. New dx 12/2012 ? NICM, may be r/t afib. b. Nuc 03/2013 - normal;  c. 03/2015 TEE EF 15-20%.  . Colon polyp, hyperplastic 5/16   removed precancerous lesions  . Depression   . Diverticulosis   . Glaucoma    right eye  . Hyperlipidemia   . Hypertension   . Melanoma of eye ([place]) 2000's   "right; it's never been biopsied"  . Melanoma of lower leg ([place]) 2015   "left; right at my knee"  . Myocardial infarction ([place]) 1998  . OSA (obstructive sleep apnea) 01/04/2016   uses cpap, pt does not know settings auto set  . Peripheral vision loss, right 2006  . Persistent atrial fibrillation ([place])    a. Dx 12/2012, s/p TEE/DCCV 01/26/13. b. On Xarelto (CHA2DS2VASc = 3);  c. 03/2015 TEE (EF 15-20%, no LAA thrombus) and DCCV - amio increased to 200 mg bid.  . Urinary hesitancy due to benign prostatic hypertrophy    Past Surgical History:  Procedure Laterality Date  . ATRIAL FIBRILLATION ABLATION N/A 11/03/2019   Procedure: ATRIAL FIBRILLATION ABLATION;  Surgeon: Chad Grayer, MD;  Location: [place] CV LAB;  Service: Cardiovascular;  Laterality: N/A;  . BACK SURGERY     upper back, cannot turn neck well  . CARDIAC CATHETERIZATION  1998  . CARDIOVERSION N/A 01/26/2013   Procedure: CARDIOVERSION;  Surgeon: Chad Perla, MD;  Location: [facility] ENDOSCOPY;  Service: Cardiovascular;  Laterality: N/A;  . CARDIOVERSION N/A 03/23/2015  Procedure: CARDIOVERSION;  Surgeon: Chad Pain, MD;  Location: [facility] ENDOSCOPY;  Service: Cardiovascular;  Laterality: N/A;  . CARDIOVERSION N/A 08/14/2017   Procedure: CARDIOVERSION;  Surgeon: Chad Hector, MD;  Location: [facility] ENDOSCOPY;  Service: Cardiovascular;  Laterality: N/A;  . CATARACT EXTRACTION Right ~ 2006  . COLONOSCOPY WITH PROPOFOL N/A 02/10/2015   Procedure: COLONOSCOPY WITH PROPOFOL;  Surgeon: Chad Mayer, MD;  Location: [facility] ENDOSCOPY;  Service: Endoscopy;  Laterality: N/A;  . COLONOSCOPY WITH  PROPOFOL N/A 08/07/2016   Procedure: COLONOSCOPY WITH PROPOFOL;  Surgeon: Chad Mayer, MD;  Location: [facility] ENDOSCOPY;  Service: Endoscopy;  Laterality: N/A;  . ENTEROSCOPY N/A 08/17/2015   Procedure: ENTEROSCOPY;  Surgeon: Chad Mayer, MD;  Location: [facility] ENDOSCOPY;  Service: Endoscopy;  Laterality: N/A;  . EP IMPLANTABLE DEVICE N/A 03/08/2015   MDT Hillery Aldo CRT-D for nonischemic CM by Dr Chad Brown for primary prevention  . GLAUCOMA SURGERY Right ~ 2006   "put 3 stents in to drain fluid" (03/08/2015) not successful, sent to [facility] to try to get last stent out  . HOT HEMOSTASIS N/A 08/07/2016   Procedure: HOT HEMOSTASIS (ARGON PLASMA COAGULATION/BICAP);  Surgeon: Chad Mayer, MD;  Location: [facility] ENDOSCOPY;  Service: Endoscopy;  Laterality: N/A;  . INCISION AND DRAINAGE ABSCESS POSTERIOR CERVICALSPINE  05/2012  . JOINT REPLACEMENT    . MELANOMA EXCISION Left 2015   "lower leg; right at my knee"  . REFRACTIVE SURGERY Right ~ 2006 X 2   "twice; both done at [facility]" (03/08/2015  . SURGERY SCROTAL / TESTICULAR Right 1990's  . TEE WITHOUT CARDIOVERSION N/A 01/26/2013   Procedure: TRANSESOPHAGEAL ECHOCARDIOGRAM (TEE);  Surgeon: Chad Perla, MD;  Location: [place];  Service: Cardiovascular;  Laterality: N/A;  Chad Brown anes. /   . TEE WITHOUT CARDIOVERSION N/A 10/05/2014   Procedure: TRANSESOPHAGEAL ECHOCARDIOGRAM (TEE)  with cardioversion;  Surgeon: Chad Headings, MD;  Location: [facility] ENDOSCOPY;  Service: Cardiovascular;  Laterality: N/A;  12:52 synched cardioversion at 120 joules,...afib to SR...12 lead EKG ordered.Marland KitchenMarland KitchenCardiozem d/c'ed per MD verbal order at SR  . TEE WITHOUT CARDIOVERSION N/A 03/23/2015   Procedure: TRANSESOPHAGEAL ECHOCARDIOGRAM (TEE);  Surgeon: Chad Pain, MD;  Location: [place];  Service: Cardiovascular;  Laterality: N/A;  . TEE WITHOUT CARDIOVERSION N/A 11/02/2019   Procedure: TRANSESOPHAGEAL ECHOCARDIOGRAM (TEE);  Surgeon: Chad Hector, MD;  Location: [facility] ENDOSCOPY;  Service:  Cardiovascular;  Laterality: N/A;  . THORACIC SPINE SURGERY  03/2000   "ground calcium deposits from upper thoracic" (01/26/2013)  . TOTAL HIP ARTHROPLASTY Right 06/2007    SOCIAL HISTORY:  Social History   Socioeconomic History  . Marital status: Married    Spouse name: Not on file  . Number of children: 3  . Years of education: Not on file  . Highest education level: Not on file  Occupational History  . Occupation: retired  Tobacco Use  . Smoking status: Former Smoker    Packs/day: 3.00    Years: 48.00    Pack years: 144.00    Types: Cigarettes    Quit date: 09/28/2000    Years since quitting: 19.8  . Smokeless tobacco: Never Used  Vaping Use  . Vaping Use: Never used  Substance and Sexual Activity  . Alcohol use: No  . Drug use: No  . Sexual activity: Yes  Other Topics Concern  . Not on file  Social History Narrative   Pt lives in [place] with spouse.  3 children are grown and healthy.  Retired.  Ran a country store for 30 years, previously worked in the [employer] for 16 years   Social Determinants of Radio broadcast assistant Strain:   . Difficulty of Paying Living Expenses: Not on file  Food Insecurity:   . Worried About Charity fundraiser in the Last Year: Not on file  . Ran Out of Food in the Last Year: Not on file  Transportation Needs:   . Lack of Transportation (Medical): Not on file  . Lack of Transportation (Non-Medical): Not on file  Physical Activity:   . Days of Exercise per Week: Not on file  . Minutes of Exercise per Session: Not on file  Stress:   . Feeling of Stress : Not on file  Social Connections:   . Frequency of Communication with Friends and Family: Not on file  . Frequency of Social Gatherings with Friends and Family: Not on file  . Attends Religious Services: Not on file  . Active Member of Clubs or Organizations: Not on file  . Attends Archivist Meetings: Not on file  . Marital Status: Not on file    Intimate Partner Violence:   . Fear of Current or Ex-Partner: Not on file  . Emotionally Abused: Not on file  . Physically Abused: Not on file  . Sexually Abused: Not on file     FAMILY HISTORY:  Family History  Problem Relation Age of Onset  . Kidney disease Mother   . Heart disease Mother        MI, open heart  . Diabetes Mother        dialysis  . Leukemia Father   . Colon cancer Paternal Uncle   . Lung cancer Paternal Uncle        x 2  . Prostate cancer Paternal Uncle   . Diabetes Maternal Grandmother   . Heart attack Maternal Uncle   . Diabetes Maternal Aunt        x 3  . Diabetes Maternal Uncle     CURRENT MEDICATIONS:  Current Outpatient Medications  Medication Sig Dispense Refill  . carvedilol (COREG) 6.25 MG tablet Take 1 tablet (6.25 mg total) by mouth 2 (two) times daily. 180 tablet 3  . Cholecalciferol 25 MCG (1000 UT) capsule Take 1 capsule by mouth daily.    . dorzolamide-timolol (COSOPT) 22.3-6.8 MG/ML ophthalmic solution Place 1 drop into the right eye 2 (two) times daily.    [name] escitalopram (LEXAPRO) 20 MG tablet Take 1 tablet (20 mg total) by mouth daily. 90 tablet 0  . Evolocumab (REPATHA SURECLICK) 315 MG/ML SOAJ Inject 1 pen into the skin every 14 (fourteen) days. 2 pen 11  . ferrous sulfate 325 (65 FE) MG tablet Take 325 mg by mouth daily with breakfast.     . finasteride (PROSCAR) 5 MG tablet Take 1 tablet (5 mg total) by mouth daily. 30 tablet 2  . furosemide (LASIX) 20 MG tablet Take 2 tablets (40 mg total) by mouth 2 (two) times daily. 360 tablet 3  . gabapentin (NEURONTIN) 100 MG capsule Take 200 mg by mouth 2 (two) times daily.     [name] latanoprost (XALATAN) 0.005 % ophthalmic solution Place 1 drop into the right eye at bedtime.     Mckinley Jewel Dimesylate (RHOPRESSA) 0.02 % SOLN Place 1 drop into the right eye at bedtime.     . nitroGLYCERIN (NITROSTAT) 0.4 MG SL tablet Place 0.4 mg under the tongue every 5 (five) minutes as  needed for chest Brown.      . Rivaroxaban (XARELTO) 15 MG TABS tablet Take 1 tablet (15 mg total) by mouth daily with supper. 30 tablet 11  . sacubitril-valsartan (ENTRESTO) 24-26 MG Taking 1/2 tablet by mouth twice a day 30 tablet 5  . spironolactone (ALDACTONE) 25 MG tablet Take 0.5 tablets (12.5 mg total) by mouth daily. Take at bedtime 45 tablet 3  . tamsulosin (FLOMAX) 0.4 MG CAPS Take 0.4 mg by mouth at bedtime.      No current facility-administered medications for this visit.    ALLERGIES:  Allergies  Allergen Reactions  . Pravastatin Sodium Other (See Comments)    Joint and muscle Brown  . Azithromycin Diarrhea    PHYSICAL EXAM:  Performance status (ECOG): 1 - Symptomatic but completely ambulatory  Vitals:   07/27/20 1406  BP: (!) 132/91  Pulse: 68  Resp: 18  Temp: (!) 97.2 F (36.2 C)  SpO2: 99%   Wt Readings from Last 3 Encounters:  07/27/20 219 lb 12.8 oz (99.7 kg)  05/16/20 218 lb (98.9 kg)  02/01/20 212 lb 6.4 oz (96.3 kg)   Physical Exam Vitals reviewed.  Constitutional:      Appearance: Normal appearance. He is obese.  Cardiovascular:     Rate and Rhythm: Normal rate and regular rhythm.     Pulses: Normal pulses.     Heart sounds: Normal heart sounds.  Pulmonary:     Effort: Pulmonary effort is normal.     Breath sounds: Normal breath sounds.  Neurological:     General: No focal deficit present.     Mental Status: He is alert and oriented to person, place, and time.  Psychiatric:        Mood and Affect: Mood normal.        Behavior: Behavior normal.      LABORATORY DATA:  I have reviewed the labs as listed.  CBC Latest Ref Rng & Units 07/25/2020 01/25/2020 10/27/2019  WBC 4.0 - 10.5 K/uL 5.0 5.7 4.6  Hemoglobin 13.0 - 17.0 g/dL 11.2(L) 11.3(L) 9.8(L)  Hematocrit 39 - 52 % 34.9(L) 36.1(L) 30.3(L)  Platelets 150 - 400 K/uL 153 128(L) 121(L)   CMP Latest Ref Rng & Units 07/25/2020 04/29/2020 03/30/2020  Glucose 70 - 99 mg/dL 101(H) 127(H) 102(H)  BUN 8 - 23 mg/dL 19 20 19    Creatinine 0.61 - 1.24 mg/dL 1.61(H) 1.89(H) 1.83(H)  Sodium 135 - 145 mmol/L 140 140 144  Potassium 3.5 - 5.1 mmol/L 4.8 4.1 4.3  Chloride 98 - 111 mmol/L 104 102 105  CO2 22 - 32 mmol/L 29 30 24   Calcium 8.9 - 10.3 mg/dL 8.8(L) 8.1(L) 8.5(L)  Total Protein 6.5 - 8.1 g/dL 6.9 - -  Total Bilirubin 0.3 - 1.2 mg/dL 0.5 - -  Alkaline Phos 38 - 126 U/L 65 - -  AST 15 - 41 U/L 13(L) - -  ALT 0 - 44 U/L 14 - -   Lab Results  Component Value Date   TIBC 280 07/25/2020   TIBC 270 01/25/2020   TIBC 263 07/27/2019   FERRITIN 166 07/25/2020   FERRITIN 221 01/25/2020   FERRITIN 213 07/27/2019   IRONPCTSAT 20 07/25/2020   IRONPCTSAT 27 01/25/2020   IRONPCTSAT 30 07/27/2019    DIAGNOSTIC IMAGING:  I have independently reviewed the scans and discussed with the patient. CT Chest Wo Contrast  Result Date: 07/25/2020 CLINICAL DATA:  Follow-up pulmonary nodules.  History of melanoma. EXAM: CT CHEST WITHOUT CONTRAST TECHNIQUE:  Multidetector CT imaging of the chest was performed following the standard protocol without IV contrast. COMPARISON:  01/25/2020 chest CT. FINDINGS: Cardiovascular: Stable mild cardiomegaly. No significant pericardial effusion/thickening. Stable configuration of 3 lead left subclavian ICD with lead tips in the right atrium, right ventricular apex and coronary sinus. Left anterior descending coronary atherosclerosis. Atherosclerotic nonaneurysmal thoracic aorta. Stable dilated main pulmonary artery (3.5 cm diameter). Mediastinum/Nodes: Partially calcified hypodense 2.1 cm right thyroid nodule, stable. Unremarkable esophagus. No pathologically enlarged axillary, mediastinal or hilar lymph nodes, noting limited sensitivity for the detection of hilar adenopathy on this noncontrast study. Lungs/Pleura: No pneumothorax. No pleural effusion. Previously visualized numerous tiny ground-glass bilateral pulmonary nodules are all stable, largest 0.4 cm in the superior segment left lower lobe  (series 4/image 76). There are numerous (greater than 10) new poorly marginated subsolid pulmonary nodules scattered throughout both lungs, for example measuring 1.1 cm in the posterior right upper lobe (series 4/image 66), 0.8 cm in the medial superior segment right lower lobe (series 4/image 70), 1.3 cm in the posterior right middle lobe (series 4/image 91) and 0.5 cm in the left lower lobe (series 4/image 103). Upper abdomen: Small hiatal hernia. Cholelithiasis. Simple partially visualized exophytic 3.6 cm posterior upper left renal cyst. Musculoskeletal: No aggressive appearing focal osseous lesions. Marked thoracic spondylosis. IMPRESSION: 1. Numerous (greater than 10) new poorly marginated subsolid pulmonary nodules scattered throughout both lungs, largest 1.3 cm in the posterior right middle lobe. Infectious/inflammatory nodularity is more likely, with pulmonary metastases not entirely excluded. Suggest follow-up chest CT in 3 months. 2. Previously visualized numerous tiny ground-glass bilateral pulmonary nodules remain stable. 3. No thoracic adenopathy. 4. Stable mild cardiomegaly. 5. Stable dilated main pulmonary artery, suggesting chronic pulmonary arterial hypertension. 6. One vessel coronary atherosclerosis. 7. Small hiatal hernia. 8. Cholelithiasis. 9. Aortic Atherosclerosis (ICD10-I70.0). These results will be called to the ordering clinician or representative by the Radiologist Assistant, and communication documented in the PACS or [employer]. Electronically Signed   By: Ilona Sorrel M.D.   On: 07/25/2020 16:21     ASSESSMENT:  1.  Lung nodules: -He was neck smoker, smoked more than 2 packs/day for many years and quit in 2001. -CT chest on 07/25/2020 showed numerous (greater than 10) new poorly marginated subsolid pulmonary nodules scattered throughout both lungs, largest 1.3 cm in the posterior right middle lobe.  Infectious/inflammatory nodularity is more likely with pulmonary metastasis  not entirely excluded.  No adenopathy.  2.  T2 N0 melanoma of the left thigh: -Status post resection in 2015 at [facility].  3.  CKD: -Follow-up with Dr. Theador Hawthorne.  4.  Mild thrombocytopenia: -He has mild intermittent thrombocytopenia of several years duration.  5.  Normocytic anemia: -Combination anemia from CKD and relative iron deficiency.   PLAN:  1.  Lung nodules: -We reviewed images of the CT scan from 07/25/2020. -Numerous new poorly marginated subsolid pulmonary nodules scattered throughout both lungs, most of them subcentimeter.  Infectious/inflammatory nodularity is more likely followed by pulmonary meta stasis.  No adenopathy. -I do not believe the PET scan will help in this situation as lesions are very small.  Hence recommended CT scan of the chest in 3 months.  2.  T2 N0 melanoma of the left thigh: -No signs of recurrence at this time other than new nodules on the CT scan which we will follow.  3.  CKD: -This is stable at 1.61.  4.  Mild thrombocytopenia: -This has resolved on current CBC with platelet count 153.  5.  Normocytic anemia: -Reviewed labs from 07/24/2020 with hemoglobin 11.2.  Ferritin is 166 and percent saturation is 20. -No parenteral iron therapy needed.    Orders placed this encounter:  No orders of the defined types were placed in this encounter.    Derek Jack, MD [place] [phone]   I, Milinda Antis, am acting as a scribe for Dr. Sanda Linger.  I, Derek Jack MD, have reviewed the above documentation for accuracy and completeness, and I agree with the above.

## 2020-08-01 ENCOUNTER — Ambulatory Visit (INDEPENDENT_AMBULATORY_CARE_PROVIDER_SITE_OTHER): Payer: Medicare PPO

## 2020-08-01 DIAGNOSIS — Z95810 Presence of automatic (implantable) cardiac defibrillator: Secondary | ICD-10-CM | POA: Diagnosis not present

## 2020-08-01 DIAGNOSIS — I5022 Chronic systolic (congestive) heart failure: Secondary | ICD-10-CM | POA: Diagnosis not present

## 2020-08-05 NOTE — Progress Notes (Signed)
EPIC Encounter for ICM Monitoring  Patient Name: Chad Brown is a 79 y.o. male Date: 08/05/2020 Primary Care Physican: Marton Redwood, MD Primary Cardiologist:Nishan/McLean Electrophysiologist: Allred Bi-V Pacing:99.2% 08/05/2020 Weight: 213 lbs (baseline)  Time in AT/AF <0.1 hr/day (<0.1%) (taking Xarelto) Longest AT/AF 9 minutes  Spoke with patient and reports feeling well at this time.  Denies fluid symptoms.    Optivol thoracic impedancenormal.  Prescribed:  Furosemide20 mg2tablets(40 mg total)twice a day.  Spironolactone 25 mg Take 0.5 tablet (12.5 mg total) by mouth daily. Take at bedtime  Labs: 07/25/2020 Creatinine 1.61, BUN 19, Potassium 4.8, Sodium 140, GFR 43 04/29/2020 Creatinine 1.89, BUN 20, Potassium 4.1, Sodium 140, GFR 33-38 03/30/2020 Creatinine 1.83, BUN 19, Potassium 4.3, Sodium 144, GFR 34-40 03/16/2020 Creatinine 1.72, BUN 19, Potassium 5.3, Sodium 141, GFR 37-43 03/09/2020 Creatinine 1.58, BUN 19, Potassium 4.3, Sodium 137, GFR 41-48 Care Everywhere 01/25/2020 Creatinine 2.54, BUN 34, Potassium 5.0, Sodium 141, GFR 23-27 A complete set of results can be found in Results Review.  Recommendations:No changes and encouraged to call if experiencing any fluid symptoms.  Follow-up plan: ICM clinic phone appointment on12/11/2019. 91 day device clinic remote transmission12/11/2019.  EP/Cardiology NextOfficeVisit:12/15/2021with Dr Rayann Heman  Copy of ICM check sent to Dr.Allred.   3 month ICM trend: 08/01/2020    1 Year ICM trend:       Rosalene Billings, RN 08/05/2020 10:00 AM

## 2020-08-16 DIAGNOSIS — D696 Thrombocytopenia, unspecified: Secondary | ICD-10-CM | POA: Diagnosis not present

## 2020-08-16 DIAGNOSIS — I5042 Chronic combined systolic (congestive) and diastolic (congestive) heart failure: Secondary | ICD-10-CM | POA: Diagnosis not present

## 2020-08-16 DIAGNOSIS — D638 Anemia in other chronic diseases classified elsewhere: Secondary | ICD-10-CM | POA: Diagnosis not present

## 2020-08-16 DIAGNOSIS — I129 Hypertensive chronic kidney disease with stage 1 through stage 4 chronic kidney disease, or unspecified chronic kidney disease: Secondary | ICD-10-CM | POA: Diagnosis not present

## 2020-08-16 DIAGNOSIS — N1832 Chronic kidney disease, stage 3b: Secondary | ICD-10-CM | POA: Diagnosis not present

## 2020-08-16 DIAGNOSIS — E211 Secondary hyperparathyroidism, not elsewhere classified: Secondary | ICD-10-CM | POA: Diagnosis not present

## 2020-08-18 DIAGNOSIS — I129 Hypertensive chronic kidney disease with stage 1 through stage 4 chronic kidney disease, or unspecified chronic kidney disease: Secondary | ICD-10-CM | POA: Diagnosis not present

## 2020-08-18 DIAGNOSIS — D696 Thrombocytopenia, unspecified: Secondary | ICD-10-CM | POA: Diagnosis not present

## 2020-08-18 DIAGNOSIS — N1832 Chronic kidney disease, stage 3b: Secondary | ICD-10-CM | POA: Diagnosis not present

## 2020-08-18 DIAGNOSIS — E211 Secondary hyperparathyroidism, not elsewhere classified: Secondary | ICD-10-CM | POA: Diagnosis not present

## 2020-08-18 DIAGNOSIS — I5042 Chronic combined systolic (congestive) and diastolic (congestive) heart failure: Secondary | ICD-10-CM | POA: Diagnosis not present

## 2020-08-18 DIAGNOSIS — D638 Anemia in other chronic diseases classified elsewhere: Secondary | ICD-10-CM | POA: Diagnosis not present

## 2020-09-01 ENCOUNTER — Ambulatory Visit (INDEPENDENT_AMBULATORY_CARE_PROVIDER_SITE_OTHER): Payer: Medicare PPO

## 2020-09-01 DIAGNOSIS — I428 Other cardiomyopathies: Secondary | ICD-10-CM

## 2020-09-01 LAB — CUP PACEART REMOTE DEVICE CHECK
Battery Remaining Longevity: 25 mo
Battery Voltage: 2.91 V
Brady Statistic AP VP Percent: 90.8 %
Brady Statistic AP VS Percent: 0.07 %
Brady Statistic AS VP Percent: 8.84 %
Brady Statistic AS VS Percent: 0.28 %
Brady Statistic RA Percent Paced: 90.46 %
Brady Statistic RV Percent Paced: 99.28 %
Date Time Interrogation Session: 20211202043824
HighPow Impedance: 74 Ohm
Implantable Lead Implant Date: 20160607
Implantable Lead Implant Date: 20160607
Implantable Lead Implant Date: 20160607
Implantable Lead Location: 753858
Implantable Lead Location: 753859
Implantable Lead Location: 753860
Implantable Lead Model: 4598
Implantable Lead Model: 5076
Implantable Pulse Generator Implant Date: 20160607
Lead Channel Impedance Value: 1102 Ohm
Lead Channel Impedance Value: 1140 Ohm
Lead Channel Impedance Value: 342 Ohm
Lead Channel Impedance Value: 342 Ohm
Lead Channel Impedance Value: 513 Ohm
Lead Channel Impedance Value: 589 Ohm
Lead Channel Impedance Value: 589 Ohm
Lead Channel Impedance Value: 646 Ohm
Lead Channel Impedance Value: 665 Ohm
Lead Channel Impedance Value: 893 Ohm
Lead Channel Impedance Value: 893 Ohm
Lead Channel Impedance Value: 969 Ohm
Lead Channel Impedance Value: 969 Ohm
Lead Channel Pacing Threshold Amplitude: 0.5 V
Lead Channel Pacing Threshold Amplitude: 0.625 V
Lead Channel Pacing Threshold Amplitude: 1.375 V
Lead Channel Pacing Threshold Pulse Width: 0.4 ms
Lead Channel Pacing Threshold Pulse Width: 0.4 ms
Lead Channel Pacing Threshold Pulse Width: 0.4 ms
Lead Channel Sensing Intrinsic Amplitude: 1.75 mV
Lead Channel Sensing Intrinsic Amplitude: 1.75 mV
Lead Channel Sensing Intrinsic Amplitude: 13.75 mV
Lead Channel Sensing Intrinsic Amplitude: 13.75 mV
Lead Channel Setting Pacing Amplitude: 1.5 V
Lead Channel Setting Pacing Amplitude: 2 V
Lead Channel Setting Pacing Amplitude: 2 V
Lead Channel Setting Pacing Pulse Width: 0.4 ms
Lead Channel Setting Pacing Pulse Width: 0.4 ms
Lead Channel Setting Sensing Sensitivity: 0.3 mV

## 2020-09-02 ENCOUNTER — Ambulatory Visit (INDEPENDENT_AMBULATORY_CARE_PROVIDER_SITE_OTHER): Payer: Medicare PPO

## 2020-09-02 DIAGNOSIS — I5022 Chronic systolic (congestive) heart failure: Secondary | ICD-10-CM | POA: Diagnosis not present

## 2020-09-02 DIAGNOSIS — Z95810 Presence of automatic (implantable) cardiac defibrillator: Secondary | ICD-10-CM | POA: Diagnosis not present

## 2020-09-05 ENCOUNTER — Telehealth: Payer: Self-pay

## 2020-09-05 NOTE — Telephone Encounter (Signed)
Remote ICM transmission received.  Attempted call to patient regarding ICM remote transmission and left detailed message per DPR.  Advised to return call for any fluid symptoms or questions. Next ICM remote transmission scheduled 10/03/2020.     

## 2020-09-05 NOTE — Progress Notes (Signed)
EPIC Encounter for ICM Monitoring  Patient Name: Chad Brown is a 79 y.o. male Date: 09/05/2020 Primary Care Physican: Marton Redwood, MD Primary Cardiologist:Nishan/McLean Electrophysiologist: Allred Bi-V Pacing:99.3% 08/05/2020 Weight: 213lbs (baseline)  Time in AT/AF  0.0 hr/day (0.0%) (taking Xarelto)  Attempted call to patient and unable to reach.  Left detailed message per DPR regarding transmission. Transmission reviewed.   Optivol thoracic impedancenormal.  Prescribed:  Furosemide20 mg2tablets(40 mg total)twice a day.  Spironolactone 25 mg Take 0.5 tablet (12.5 mg total) by mouth daily. Take at bedtime  Labs: 07/25/2020 Creatinine 1.61, BUN 19, Potassium 4.8, Sodium 140, GFR 43 04/29/2020 Creatinine 1.89, BUN 20, Potassium 4.1, Sodium 140, GFR 33-38 03/30/2020 Creatinine 1.83, BUN 19, Potassium 4.3, Sodium 144, GFR 34-40 03/16/2020 Creatinine 1.72, BUN 19, Potassium 5.3, Sodium 141, GFR 37-43 03/09/2020 Creatinine 1.58, BUN 19, Potassium 4.3, Sodium 137, GFR 41-48 Care Everywhere 01/25/2020 Creatinine 2.54, BUN 34, Potassium 5.0, Sodium 141, GFR 23-27 A complete set of results can be found in Results Review.  Recommendations:Left voice mail with ICM number and encouraged to call if experiencing any fluid symptoms.  Follow-up plan: ICM clinic phone appointment on1/11/2020. 91 day device clinic remote transmission 12/01/2020.  EP/Cardiology NextOfficeVisit:10/05/2020 with Dr Rayann Heman  Copy of ICM check sent to Dr.Allred.  3 month ICM trend: 09/01/2020    1 Year ICM trend:       Rosalene Billings, RN 09/05/2020 12:27 PM

## 2020-09-08 NOTE — Progress Notes (Signed)
Remote ICD transmission.   

## 2020-09-12 DIAGNOSIS — E7800 Pure hypercholesterolemia, unspecified: Secondary | ICD-10-CM | POA: Diagnosis not present

## 2020-09-12 DIAGNOSIS — Z125 Encounter for screening for malignant neoplasm of prostate: Secondary | ICD-10-CM | POA: Diagnosis not present

## 2020-09-14 ENCOUNTER — Encounter: Payer: Medicare PPO | Admitting: Internal Medicine

## 2020-09-19 ENCOUNTER — Encounter: Payer: Medicare PPO | Admitting: Internal Medicine

## 2020-09-19 DIAGNOSIS — R059 Cough, unspecified: Secondary | ICD-10-CM | POA: Diagnosis not present

## 2020-09-19 DIAGNOSIS — N1831 Chronic kidney disease, stage 3a: Secondary | ICD-10-CM | POA: Diagnosis not present

## 2020-09-19 DIAGNOSIS — I5022 Chronic systolic (congestive) heart failure: Secondary | ICD-10-CM | POA: Diagnosis not present

## 2020-09-19 DIAGNOSIS — R82998 Other abnormal findings in urine: Secondary | ICD-10-CM | POA: Diagnosis not present

## 2020-09-19 DIAGNOSIS — M791 Myalgia, unspecified site: Secondary | ICD-10-CM | POA: Diagnosis not present

## 2020-09-19 DIAGNOSIS — G720 Drug-induced myopathy: Secondary | ICD-10-CM | POA: Diagnosis not present

## 2020-09-19 DIAGNOSIS — E785 Hyperlipidemia, unspecified: Secondary | ICD-10-CM | POA: Diagnosis not present

## 2020-09-19 DIAGNOSIS — R911 Solitary pulmonary nodule: Secondary | ICD-10-CM | POA: Diagnosis not present

## 2020-09-19 DIAGNOSIS — I700 Atherosclerosis of aorta: Secondary | ICD-10-CM | POA: Diagnosis not present

## 2020-09-19 DIAGNOSIS — Z1339 Encounter for screening examination for other mental health and behavioral disorders: Secondary | ICD-10-CM | POA: Diagnosis not present

## 2020-09-19 DIAGNOSIS — D6869 Other thrombophilia: Secondary | ICD-10-CM | POA: Diagnosis not present

## 2020-09-19 DIAGNOSIS — I10 Essential (primary) hypertension: Secondary | ICD-10-CM | POA: Diagnosis not present

## 2020-09-19 DIAGNOSIS — I130 Hypertensive heart and chronic kidney disease with heart failure and stage 1 through stage 4 chronic kidney disease, or unspecified chronic kidney disease: Secondary | ICD-10-CM | POA: Diagnosis not present

## 2020-09-19 DIAGNOSIS — Z0000 Encounter for general adult medical examination without abnormal findings: Secondary | ICD-10-CM | POA: Diagnosis not present

## 2020-09-19 DIAGNOSIS — Z1331 Encounter for screening for depression: Secondary | ICD-10-CM | POA: Diagnosis not present

## 2020-09-20 DIAGNOSIS — Z1212 Encounter for screening for malignant neoplasm of rectum: Secondary | ICD-10-CM | POA: Diagnosis not present

## 2020-10-03 ENCOUNTER — Ambulatory Visit (INDEPENDENT_AMBULATORY_CARE_PROVIDER_SITE_OTHER): Payer: Medicare PPO

## 2020-10-03 DIAGNOSIS — Z95810 Presence of automatic (implantable) cardiac defibrillator: Secondary | ICD-10-CM

## 2020-10-03 DIAGNOSIS — I5022 Chronic systolic (congestive) heart failure: Secondary | ICD-10-CM

## 2020-10-04 ENCOUNTER — Telehealth: Payer: Self-pay

## 2020-10-04 NOTE — Progress Notes (Signed)
EPIC Encounter for ICM Monitoring  Patient Name: Chad Brown is a 80 y.o. male Date: 10/04/2020 Primary Care Physican: Marton Redwood, MD Primary Cardiologist:Nishan/McLean Electrophysiologist: Allred Bi-V Pacing:98.9% 08/05/2020 Weight: 213lbs (baseline)  Time in AT/AF0.0 hr/day (0.0%)(taking Xarelto)  Attempted call to patient and unable to reach.  Left detailed message per DPR regarding transmission. Transmission reviewed.   Optivol thoracic impedancenormal.  Prescribed:  Furosemide20 mg2tablets(40 mg total)twice a day.  Spironolactone 25 mgTake 0.5 tablet (12.5 mg total) by mouth daily. Take at bedtime  Labs: 07/25/2020 Creatinine 1.61, BUN 19, Potassium 4.8, Sodium 140, GFR 43 04/29/2020 Creatinine 1.89, BUN 20, Potassium 4.1, Sodium 140, GFR 33-38 03/30/2020 Creatinine 1.83, BUN 19, Potassium 4.3, Sodium 144, GFR 34-40 03/16/2020 Creatinine 1.72, BUN 19, Potassium 5.3, Sodium 141, GFR 37-43 03/09/2020 Creatinine 1.58, BUN 19, Potassium 4.3, Sodium 137, GFR 41-48 Care Everywhere 01/25/2020 Creatinine 2.54, BUN 34, Potassium 5.0, Sodium 141, GFR 23-27 A complete set of results can be found in Results Review.  Recommendations:Left voice mail with ICM number and encouraged to call if experiencing any fluid symptoms.  Follow-up plan: ICM clinic phone appointment on2/03/2021. 91 day device clinic remote transmission 12/01/2020.  EP/Cardiology NextOfficeVisit:10/05/2020 with Dr Rayann Heman  Copy of ICM check sent to Dr.Allred.  3 month ICM trend: 10/04/2020.    1 Year ICM trend:       Rosalene Billings, RN 10/04/2020 2:40 PM

## 2020-10-04 NOTE — Telephone Encounter (Signed)
Remote ICM transmission received.  Attempted call to patient regarding ICM remote transmission and left detailed message per DPR.  Advised to return call for any fluid symptoms or questions. Next ICM remote transmission scheduled 11/07/2020.   ° ° °

## 2020-10-05 ENCOUNTER — Encounter: Payer: Self-pay | Admitting: Internal Medicine

## 2020-10-05 ENCOUNTER — Other Ambulatory Visit: Payer: Self-pay

## 2020-10-05 ENCOUNTER — Ambulatory Visit (INDEPENDENT_AMBULATORY_CARE_PROVIDER_SITE_OTHER): Payer: Medicare PPO | Admitting: Internal Medicine

## 2020-10-05 VITALS — BP 130/64 | HR 67 | Ht 66.0 in | Wt 216.0 lb

## 2020-10-05 DIAGNOSIS — I428 Other cardiomyopathies: Secondary | ICD-10-CM | POA: Diagnosis not present

## 2020-10-05 DIAGNOSIS — I4819 Other persistent atrial fibrillation: Secondary | ICD-10-CM | POA: Diagnosis not present

## 2020-10-05 DIAGNOSIS — G4733 Obstructive sleep apnea (adult) (pediatric): Secondary | ICD-10-CM | POA: Diagnosis not present

## 2020-10-05 DIAGNOSIS — I5022 Chronic systolic (congestive) heart failure: Secondary | ICD-10-CM | POA: Diagnosis not present

## 2020-10-05 LAB — CUP PACEART INCLINIC DEVICE CHECK
Battery Remaining Longevity: 24 mo
Battery Voltage: 2.92 V
Brady Statistic AP VP Percent: 90.04 %
Brady Statistic AP VS Percent: 0.07 %
Brady Statistic AS VP Percent: 9.73 %
Brady Statistic AS VS Percent: 0.16 %
Brady Statistic RA Percent Paced: 89.54 %
Brady Statistic RV Percent Paced: 99.28 %
Date Time Interrogation Session: 20220105145717
HighPow Impedance: 68 Ohm
Implantable Lead Implant Date: 20160607
Implantable Lead Implant Date: 20160607
Implantable Lead Implant Date: 20160607
Implantable Lead Location: 753858
Implantable Lead Location: 753859
Implantable Lead Location: 753860
Implantable Lead Model: 4598
Implantable Lead Model: 5076
Implantable Pulse Generator Implant Date: 20160607
Lead Channel Impedance Value: 1026 Ohm
Lead Channel Impedance Value: 1159 Ohm
Lead Channel Impedance Value: 1235 Ohm
Lead Channel Impedance Value: 380 Ohm
Lead Channel Impedance Value: 399 Ohm
Lead Channel Impedance Value: 456 Ohm
Lead Channel Impedance Value: 532 Ohm
Lead Channel Impedance Value: 589 Ohm
Lead Channel Impedance Value: 665 Ohm
Lead Channel Impedance Value: 703 Ohm
Lead Channel Impedance Value: 912 Ohm
Lead Channel Impedance Value: 912 Ohm
Lead Channel Impedance Value: 988 Ohm
Lead Channel Pacing Threshold Amplitude: 0.5 V
Lead Channel Pacing Threshold Amplitude: 0.5 V
Lead Channel Pacing Threshold Amplitude: 1 V
Lead Channel Pacing Threshold Pulse Width: 0.4 ms
Lead Channel Pacing Threshold Pulse Width: 0.4 ms
Lead Channel Pacing Threshold Pulse Width: 0.4 ms
Lead Channel Sensing Intrinsic Amplitude: 17.75 mV
Lead Channel Sensing Intrinsic Amplitude: 2 mV
Lead Channel Setting Pacing Amplitude: 1.5 V
Lead Channel Setting Pacing Amplitude: 2 V
Lead Channel Setting Pacing Amplitude: 2 V
Lead Channel Setting Pacing Pulse Width: 0.4 ms
Lead Channel Setting Pacing Pulse Width: 0.4 ms
Lead Channel Setting Sensing Sensitivity: 0.3 mV

## 2020-10-05 NOTE — Progress Notes (Signed)
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 male who presents today for routine electrophysiology followup.  Since last being seen in our clinic, the patient reports doing very well.  Today, he denies symptoms of palpitations, chest pain, shortness of breath,  lower extremity edema, dizziness, presyncope, syncope, or ICD shocks.  The patient is otherwise without complaint today.   Past Medical History:  Diagnosis Date  . Adenomatous colon polyp    tubular  . AICD (automatic cardioverter/defibrillator) present 03/08/2015   MDT CRTD   . Anemia    iron deficient  . Arthritis    "about all my joints; hands, knees, back" (03/08/2015)  . Atherosclerosis   . Cataract    left eye small  . Cholelithiasis    gallstones  . Chronic systolic CHF (congestive heart failure) ([place])    a. New dx 12/2012 ? NICM, may be r/t afib. b. Nuc 03/2013 - normal;  c. 03/2015 TEE EF 15-20%.  . Colon polyp, hyperplastic 5/16   removed precancerous lesions  . Depression   . Diverticulosis   . Glaucoma    right eye  . Hyperlipidemia   . Hypertension   . Melanoma of eye ([place]) 2000's   "right; it's never been biopsied"  . Melanoma of lower leg ([place]) 2015   "left; right at my knee"  . Myocardial infarction ([place]) 1998  . OSA (obstructive sleep apnea) 01/04/2016   uses cpap, pt does not know settings auto set  . Peripheral vision loss, right 2006  . Persistent atrial fibrillation ([place])    a. Dx 12/2012, s/p TEE/DCCV 01/26/13. b. On Xarelto (CHA2DS2VASc = 3);  c. 03/2015 TEE (EF 15-20%, no LAA thrombus) and DCCV - amio increased to 200 mg bid.  . Urinary hesitancy due to benign prostatic hypertrophy    Past Surgical History:  Procedure Laterality Date  . ATRIAL FIBRILLATION ABLATION N/A 11/03/2019   Procedure: ATRIAL FIBRILLATION ABLATION;  Surgeon: Thompson Grayer, MD;  Location: [facility];  Service: Cardiovascular;  Laterality: N/A;  . BACK SURGERY     upper back, cannot  turn neck well  . CARDIAC CATHETERIZATION  1998  . CARDIOVERSION N/A 01/26/2013   Procedure: CARDIOVERSION;  Surgeon: Lelon Perla, MD;  Location: [facility] ENDOSCOPY;  Service: Cardiovascular;  Laterality: N/A;  . CARDIOVERSION N/A 03/23/2015   Procedure: CARDIOVERSION;  Surgeon: Jerline Pain, MD;  Location: [facility] ENDOSCOPY;  Service: Cardiovascular;  Laterality: N/A;  . CARDIOVERSION N/A 08/14/2017   Procedure: CARDIOVERSION;  Surgeon: Josue Hector, MD;  Location: [facility];  Service: Cardiovascular;  Laterality: N/A;  . CATARACT EXTRACTION Right ~ 2006  . COLONOSCOPY WITH PROPOFOL N/A 02/10/2015   Procedure: COLONOSCOPY WITH PROPOFOL;  Surgeon: Gatha Mayer, MD;  Location: [facility] ENDOSCOPY;  Service: Endoscopy;  Laterality: N/A;  . COLONOSCOPY WITH PROPOFOL N/A 08/07/2016   Procedure: COLONOSCOPY WITH PROPOFOL;  Surgeon: Gatha Mayer, MD;  Location: [facility] ENDOSCOPY;  Service: Endoscopy;  Laterality: N/A;  . ENTEROSCOPY N/A 08/17/2015   Procedure: ENTEROSCOPY;  Surgeon: Gatha Mayer, MD;  Location: [facility] ENDOSCOPY;  Service: Endoscopy;  Laterality: N/A;  . EP IMPLANTABLE DEVICE N/A 03/08/2015   MDT Hillery Aldo CRT-D for nonischemic CM by Dr Rayann Heman for primary prevention  . GLAUCOMA SURGERY Right ~ 2006   "put 3 stents in to drain fluid" (03/08/2015) not successful, sent to [facility] to try to get last stent out  . HOT HEMOSTASIS N/A  08/07/2016   Procedure: HOT HEMOSTASIS (ARGON PLASMA COAGULATION/BICAP);  Surgeon: Gatha Mayer, MD;  Location: [facility];  Service: Endoscopy;  Laterality: N/A;  . INCISION AND DRAINAGE ABSCESS POSTERIOR CERVICALSPINE  05/2012  . JOINT REPLACEMENT    . MELANOMA EXCISION Left 2015   "lower leg; right at my knee"  . REFRACTIVE SURGERY Right ~ 2006 X 2   "twice; both done at [place]" (03/08/2015  . SURGERY SCROTAL / TESTICULAR Right 1990's  . TEE WITHOUT CARDIOVERSION N/A 01/26/2013   Procedure: TRANSESOPHAGEAL ECHOCARDIOGRAM (TEE);  Surgeon: Lelon Perla, MD;  Location: [facility];  Service: Cardiovascular;  Laterality: N/A;  [name] anes. /   . TEE WITHOUT CARDIOVERSION N/A 10/05/2014   Procedure: TRANSESOPHAGEAL ECHOCARDIOGRAM (TEE)  with cardioversion;  Surgeon: Thayer Headings, MD;  Location: [facility] ENDOSCOPY;  Service: Cardiovascular;  Laterality: N/A;  12:52 synched cardioversion at 120 joules,...afib to SR...12 lead EKG ordered.Marland KitchenMarland KitchenCardiozem d/c'ed per MD verbal order at SR  . TEE WITHOUT CARDIOVERSION N/A 03/23/2015   Procedure: TRANSESOPHAGEAL ECHOCARDIOGRAM (TEE);  Surgeon: Jerline Pain, MD;  Location: [facility];  Service: Cardiovascular;  Laterality: N/A;  . TEE WITHOUT CARDIOVERSION N/A 11/02/2019   Procedure: TRANSESOPHAGEAL ECHOCARDIOGRAM (TEE);  Surgeon: Josue Hector, MD;  Location: [facility] ENDOSCOPY;  Service: Cardiovascular;  Laterality: N/A;  . THORACIC SPINE SURGERY  03/2000   "ground calcium deposits from upper thoracic" (01/26/2013)  . TOTAL HIP ARTHROPLASTY Right 06/2007    ROS- all systems are reviewed and negative except as per HPI above  Current Outpatient Medications  Medication Sig Dispense Refill  . carvedilol (COREG) 6.25 MG tablet Take 1 tablet (6.25 mg total) by mouth 2 (two) times daily. 180 tablet 3  . Cholecalciferol 25 MCG (1000 UT) capsule Take 1 capsule by mouth daily.    . dorzolamide-timolol (COSOPT) 22.3-6.8 MG/ML ophthalmic solution Place 1 drop into the right eye 2 (two) times daily.    [name] escitalopram (LEXAPRO) 20 MG tablet Take 1 tablet (20 mg total) by mouth daily. 90 tablet 0  . Evolocumab (REPATHA SURECLICK) 263 MG/ML SOAJ Inject 1 pen into the skin every 14 (fourteen) days. 2 pen 11  . ferrous sulfate 325 (65 FE) MG tablet Take 325 mg by mouth daily with breakfast.     . finasteride (PROSCAR) 5 MG tablet Take 1 tablet (5 mg total) by mouth daily. 30 tablet 2  . furosemide (LASIX) 20 MG tablet Take 2 tablets (40 mg total) by mouth 2 (two) times daily. 360 tablet 3  . gabapentin (NEURONTIN) 100 MG capsule Take 200 mg by mouth 2  (two) times daily.     [name] latanoprost (XALATAN) 0.005 % ophthalmic solution Place 1 drop into the right eye at bedtime.     Mckinley Jewel Dimesylate (RHOPRESSA) 0.02 % SOLN Place 1 drop into the right eye at bedtime.     . nitroGLYCERIN (NITROSTAT) 0.4 MG SL tablet Place 0.4 mg under the tongue every 5 (five) minutes as needed for chest pain.     . Rivaroxaban (XARELTO) 15 MG TABS tablet Take 1 tablet (15 mg total) by mouth daily with supper. 30 tablet 11  . sacubitril-valsartan (ENTRESTO) 24-26 MG Taking 1/2 tablet by mouth twice a day 30 tablet 5  . spironolactone (ALDACTONE) 25 MG tablet Take 0.5 tablets (12.5 mg total) by mouth daily. Take at bedtime 45 tablet 3  . tamsulosin (FLOMAX) 0.4 MG CAPS Take 0.4 mg by mouth at bedtime.      No current facility-administered  medications for this visit.    Physical Exam: Vitals:   10/05/20 1409  BP: 130/64  Pulse: 67  SpO2: 96%  Weight: 216 lb (98 kg)  Height: 5\' 6"  (1.676 m)    GEN- The patient is well appearing, alert and oriented x 3 today.   Head- normocephalic, atraumatic Eyes-  Sclera clear, conjunctiva pink Ears- hearing intact Oropharynx- clear Lungs- Clear to ausculation bilaterally, normal work of breathing Chest- ICD pocket is well healed Heart- Regular rate and rhythm, no murmurs, rubs or gallops, PMI not laterally displaced GI- soft, NT, ND, + BS Extremities- no clubbing, cyanosis, or edema  ICD interrogation- reviewed in detail today,  See PACEART report  ekg tracing ordered today is personally reviewed and shows sinus and BiV paced  Wt Readings from Last 3 Encounters:  10/05/20 216 lb (98 kg)  07/27/20 219 lb 12.8 oz (99.7 kg)  05/16/20 218 lb (98.9 kg)    Assessment and Plan:  1.  Chronic systolic dysfunction euvolemic today Stable on an appropriate medical regimen Normal ICD function See [facility] report No changes today he is not device dependant today followed in ICM device clinic  2. Persistent  afib Well controlled post ablation off (0% burden) Amiodarone On xarelto 15mg  daily (renal dosing)  3. OSA Compliant with CPAP  Risks, benefits and potential toxicities for medications prescribed and/or refilled reviewed with patient today.   Return to see Reneein 6 months, I will see in a year  Thompson Grayer MD, [facility] 10/05/2020 2:32 PM

## 2020-10-05 NOTE — Patient Instructions (Addendum)
Medication Instructions:  Your physician recommends that you continue on your current medications as directed. Please refer to the Current Medication list given to you today.  *If you need a refill on your cardiac medications before your next appointment, please call your pharmacy*  Lab Work: None ordered.  If you have labs (blood work) drawn today and your tests are completely normal, you will receive your results only by: [name] MyChart Message (if you have MyChart) OR . A paper copy in the mail If you have any lab test that is abnormal or we need to change your treatment, we will call you to review the results.  Testing/Procedures: None ordered.  Follow-Up: At [facility], you and your health needs are our priority.  As part of our continuing mission to provide you with exceptional heart care, we have created designated Provider Care Teams.  These Care Teams include your primary Cardiologist (physician) and Advanced Practice Providers (APPs -  Physician Assistants and Nurse Practitioners) who all work together to provide you with the care you need, when you need it.  We recommend signing up for the patient portal called "MyChart".  Sign up information is provided on this After Visit Summary.  MyChart is used to connect with patients for Virtual Visits (Telemedicine).  Patients are able to view lab/test results, encounter notes, upcoming appointments, etc.  Non-urgent messages can be sent to your provider as well.   To learn more about what you can do with MyChart, go to [email or web address].    Your next appointment:   Your physician wants you to follow-up in: 6 months with Chad Brown. 1 year with Chad Brown. You will receive a reminder letter in the mail two months in advance. If you don't receive a letter, please call our office to schedule the follow-up appointment.  Remote monitoring is used to monitor your ICD from home. This monitoring reduces the number of office visits required  to check your device to one time per year. It allows [place] to keep an eye on the functioning of your device to ensure it is working properly. You are scheduled for a device check from home on 11/07/20 . You may send your transmission at any time that day. If you have a wireless device, the transmission will be sent automatically. After your physician reviews your transmission, you will receive a postcard with your next transmission date.  Other Instructions:

## 2020-10-27 ENCOUNTER — Other Ambulatory Visit: Payer: Self-pay

## 2020-10-27 ENCOUNTER — Inpatient Hospital Stay (HOSPITAL_COMMUNITY): Payer: Medicare PPO | Attending: Hematology

## 2020-10-27 ENCOUNTER — Ambulatory Visit (HOSPITAL_COMMUNITY)
Admission: RE | Admit: 2020-10-27 | Discharge: 2020-10-27 | Disposition: A | Discharge status: Home / Self Care | Payer: Medicare PPO | Source: Ambulatory Visit | Attending: Hematology | Admitting: Hematology

## 2020-10-27 DIAGNOSIS — Z79899 Other long term (current) drug therapy: Secondary | ICD-10-CM | POA: Insufficient documentation

## 2020-10-27 DIAGNOSIS — I700 Atherosclerosis of aorta: Secondary | ICD-10-CM | POA: Diagnosis not present

## 2020-10-27 DIAGNOSIS — N189 Chronic kidney disease, unspecified: Secondary | ICD-10-CM | POA: Insufficient documentation

## 2020-10-27 DIAGNOSIS — Z85820 Personal history of malignant melanoma of skin: Secondary | ICD-10-CM | POA: Insufficient documentation

## 2020-10-27 DIAGNOSIS — D696 Thrombocytopenia, unspecified: Secondary | ICD-10-CM | POA: Insufficient documentation

## 2020-10-27 DIAGNOSIS — I517 Cardiomegaly: Secondary | ICD-10-CM | POA: Diagnosis not present

## 2020-10-27 DIAGNOSIS — R918 Other nonspecific abnormal finding of lung field: Secondary | ICD-10-CM | POA: Insufficient documentation

## 2020-10-27 DIAGNOSIS — E611 Iron deficiency: Secondary | ICD-10-CM | POA: Insufficient documentation

## 2020-10-27 DIAGNOSIS — J984 Other disorders of lung: Secondary | ICD-10-CM | POA: Diagnosis not present

## 2020-10-27 DIAGNOSIS — D631 Anemia in chronic kidney disease: Secondary | ICD-10-CM | POA: Diagnosis not present

## 2020-10-27 DIAGNOSIS — C439 Malignant melanoma of skin, unspecified: Secondary | ICD-10-CM

## 2020-10-27 DIAGNOSIS — J929 Pleural plaque without asbestos: Secondary | ICD-10-CM | POA: Diagnosis not present

## 2020-10-27 LAB — CBC WITH DIFFERENTIAL/PLATELET
Abs Immature Granulocytes: 0.02 10*3/uL (ref 0.00–0.07)
Basophils Absolute: 0.1 10*3/uL (ref 0.0–0.1)
Basophils Relative: 1 %
Eosinophils Absolute: 0.2 10*3/uL (ref 0.0–0.5)
Eosinophils Relative: 3 %
HCT: 35.7 % — ABNORMAL LOW (ref 39.0–52.0)
Hemoglobin: 11.4 g/dL — ABNORMAL LOW (ref 13.0–17.0)
Immature Granulocytes: 0 %
Lymphocytes Relative: 18 %
Lymphs Abs: 1 10*3/uL (ref 0.7–4.0)
MCH: 31 pg (ref 26.0–34.0)
MCHC: 31.9 g/dL (ref 30.0–36.0)
MCV: 97 fL (ref 80.0–100.0)
Monocytes Absolute: 0.3 10*3/uL (ref 0.1–1.0)
Monocytes Relative: 6 %
Neutro Abs: 3.9 10*3/uL (ref 1.7–7.7)
Neutrophils Relative %: 72 %
Platelets: 141 10*3/uL — ABNORMAL LOW (ref 150–400)
RBC: 3.68 MIL/uL — ABNORMAL LOW (ref 4.22–5.81)
RDW: 14.7 % (ref 11.5–15.5)
WBC: 5.4 10*3/uL (ref 4.0–10.5)
nRBC: 0 % (ref 0.0–0.2)

## 2020-10-27 LAB — COMPREHENSIVE METABOLIC PANEL
ALT: 14 U/L (ref 0–44)
AST: 13 U/L — ABNORMAL LOW (ref 15–41)
Albumin: 3.5 g/dL (ref 3.5–5.0)
Alkaline Phosphatase: 70 U/L (ref 38–126)
Anion gap: 6 (ref 5–15)
BUN: 17 mg/dL (ref 8–23)
CO2: 30 mmol/L (ref 22–32)
Calcium: 8.4 mg/dL — ABNORMAL LOW (ref 8.9–10.3)
Chloride: 101 mmol/L (ref 98–111)
Creatinine, Ser: 1.34 mg/dL — ABNORMAL HIGH (ref 0.61–1.24)
GFR, Estimated: 54 mL/min — ABNORMAL LOW (ref >60)
Glucose, Bld: 145 mg/dL — ABNORMAL HIGH (ref 70–99)
Potassium: 3.3 mmol/L — ABNORMAL LOW (ref 3.5–5.1)
Sodium: 137 mmol/L (ref 135–145)
Total Bilirubin: 0.5 mg/dL (ref 0.3–1.2)
Total Protein: 7 g/dL (ref 6.5–8.1)

## 2020-10-27 LAB — FERRITIN: Ferritin: 165 ng/mL (ref 24–336)

## 2020-10-27 LAB — IRON AND TIBC
Iron: 56 ug/dL (ref 45–182)
Saturation Ratios: 20 % (ref 17.9–39.5)
TIBC: 277 ug/dL (ref 250–450)
UIBC: 221 ug/dL

## 2020-10-27 LAB — LACTATE DEHYDROGENASE: LDH: 133 U/L (ref 98–192)

## 2020-11-03 ENCOUNTER — Other Ambulatory Visit: Payer: Self-pay

## 2020-11-03 ENCOUNTER — Inpatient Hospital Stay (HOSPITAL_COMMUNITY): Payer: Medicare PPO | Attending: Hematology | Admitting: Hematology

## 2020-11-03 VITALS — BP 121/77 | HR 70 | Temp 98.9°F | Resp 17 | Wt 219.2 lb

## 2020-11-03 DIAGNOSIS — D631 Anemia in chronic kidney disease: Secondary | ICD-10-CM | POA: Insufficient documentation

## 2020-11-03 DIAGNOSIS — N189 Chronic kidney disease, unspecified: Secondary | ICD-10-CM | POA: Insufficient documentation

## 2020-11-03 DIAGNOSIS — Z85820 Personal history of malignant melanoma of skin: Secondary | ICD-10-CM | POA: Diagnosis not present

## 2020-11-03 DIAGNOSIS — Z79899 Other long term (current) drug therapy: Secondary | ICD-10-CM | POA: Diagnosis not present

## 2020-11-03 DIAGNOSIS — D696 Thrombocytopenia, unspecified: Secondary | ICD-10-CM | POA: Diagnosis not present

## 2020-11-03 DIAGNOSIS — C439 Malignant melanoma of skin, unspecified: Secondary | ICD-10-CM

## 2020-11-03 DIAGNOSIS — R918 Other nonspecific abnormal finding of lung field: Secondary | ICD-10-CM | POA: Diagnosis not present

## 2020-11-03 NOTE — Progress Notes (Signed)
[place], [place] [address]   CLINIC:  Medical Oncology/Hematology  PCP:  Marton Redwood, MD [address] / [place] [address] [phone]   REASON FOR VISIT:  Follow-up for lung nodules, melanoma of left thigh and thrombocytopenia  PRIOR THERAPY: Melanoma resection in 2015  NGS Results: Not done  CURRENT THERAPY: Observation  BRIEF ONCOLOGIC HISTORY:  Oncology History   No history exists.    CANCER STAGING: Cancer Staging No matching staging information was found for the patient.  INTERVAL HISTORY:  [name], a 80 y.o. male, returns for routine follow-up of his lung nodules, melanoma of left thigh and thrombocytopenia. Verdie was last seen on 07/27/2020.   Today he is accompanied by his wife and he reports feeling well. He denies having any recent infections, F/C or night sweats, though he reports that he developed a cough with occasional greenish sputum. He has taken iron tablets in the past but they caused constipation.  He quit smoking in 2002. 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   REVIEW OF SYSTEMS:  Review of Systems  Constitutional: Positive for fatigue (75%). Negative for appetite change, chills, diaphoresis and fever.  Respiratory: Positive for cough (occasionally greenish sputum).   Neurological: Positive for dizziness and headaches.  All other systems reviewed and are negative.   PAST MEDICAL/SURGICAL HISTORY:  Past Medical History:  Diagnosis Date  . Adenomatous colon polyp    tubular  . AICD (automatic cardioverter/defibrillator) present 03/08/2015   MDT CRTD   . Anemia    iron deficient  . Arthritis    "about all my joints; hands, knees, back" (03/08/2015)  . Atherosclerosis   . Cataract    left eye small  . Cholelithiasis    gallstones  . Chronic systolic CHF (congestive heart failure) ([place])    a. New dx 12/2012 ? NICM, may be r/t afib. b.  Nuc 03/2013 - normal;  c. 03/2015 TEE EF 15-20%.  . Colon polyp, hyperplastic 5/16   removed precancerous lesions  . Depression   . Diverticulosis   . Glaucoma    right eye  . Hyperlipidemia   . Hypertension   . Melanoma of eye ([facility]) 2000's   "right; it's never been biopsied"  . Melanoma of lower leg ([facility]) 2015   "left; right at my knee"  . Myocardial infarction ([facility]) 1998  . OSA (obstructive sleep apnea) 01/04/2016   uses cpap, pt does not know settings auto set  . Peripheral vision loss, right 2006  . Persistent atrial fibrillation ([facility])    a. Dx 12/2012, s/p TEE/DCCV 01/26/13. b. On Xarelto (CHA2DS2VASc = 3);  c. 03/2015 TEE (EF 15-20%, no LAA thrombus) and DCCV - amio increased to 200 mg bid.  . Urinary hesitancy due to benign prostatic hypertrophy    Past Surgical History:  Procedure Laterality Date  . ATRIAL FIBRILLATION ABLATION N/A 11/03/2019   Procedure: ATRIAL FIBRILLATION ABLATION;  Surgeon: Thompson Grayer, MD;  Location: [facility] CV LAB;  Service: Cardiovascular;  Laterality: N/A;  . BACK SURGERY     upper back, cannot turn neck well  . CARDIAC CATHETERIZATION  1998  . CARDIOVERSION N/A 01/26/2013   Procedure: CARDIOVERSION;  Surgeon: Lelon Perla, MD;  Location[facility] ENDOSCOPY;  Service: Cardiovascular;  Laterality: N/A;  . CARDIOVERSION N/A 03/23/2015   Procedure: CARDIOVERSION;  Surgeon: Jerline Pain, MD;  Location: [facility];  Service: Cardiovascular;  Laterality:  N/A;  . CARDIOVERSION N/A 08/14/2017   Procedure: CARDIOVERSION;  Surgeon: Josue Hector, MD;  Location: [facility] ENDOSCOPY;  Service: Cardiovascular;  Laterality: N/A;  . CATARACT EXTRACTION Right ~ 2006  . COLONOSCOPY WITH PROPOFOL N/A 02/10/2015   Procedure: COLONOSCOPY WITH PROPOFOL;  Surgeon: Gatha Mayer, MD;  Location: [facility] ENDOSCOPY;  Service: Endoscopy;  Laterality: N/A;  . COLONOSCOPY WITH PROPOFOL N/A 08/07/2016   Procedure: COLONOSCOPY WITH PROPOFOL;  Surgeon: Gatha Mayer, MD;  Location: [facility]  ENDOSCOPY;  Service: Endoscopy;  Laterality: N/A;  . ENTEROSCOPY N/A 08/17/2015   Procedure: ENTEROSCOPY;  Surgeon: Gatha Mayer, MD;  Location: [facility] ENDOSCOPY;  Service: Endoscopy;  Laterality: N/A;  . EP IMPLANTABLE DEVICE N/A 03/08/2015   MDT Hillery Aldo CRT-D for nonischemic CM by Dr Rayann Heman for primary prevention  . GLAUCOMA SURGERY Right ~ 2006   "put 3 stents in to drain fluid" (03/08/2015) not successful, sent to [facility] to try to get last stent out  . HOT HEMOSTASIS N/A 08/07/2016   Procedure: HOT HEMOSTASIS (ARGON PLASMA COAGULATION/BICAP);  Surgeon: Gatha Mayer, MD;  Location: [facility];  Service: Endoscopy;  Laterality: N/A;  . INCISION AND DRAINAGE ABSCESS POSTERIOR CERVICALSPINE  05/2012  . JOINT REPLACEMENT    . MELANOMA EXCISION Left 2015   "lower leg; right at my knee"  . REFRACTIVE SURGERY Right ~ 2006 X 2   "twice; both done at [facility]" (03/08/2015  . SURGERY SCROTAL / TESTICULAR Right 1990's  . TEE WITHOUT CARDIOVERSION N/A 01/26/2013   Procedure: TRANSESOPHAGEAL ECHOCARDIOGRAM (TEE);  Surgeon: Lelon Perla, MD;  Location: [facility];  Service: Cardiovascular;  Laterality: N/A;  [name] anes. /   . TEE WITHOUT CARDIOVERSION N/A 10/05/2014   Procedure: TRANSESOPHAGEAL ECHOCARDIOGRAM (TEE)  with cardioversion;  Surgeon: Thayer Headings, MD;  Location: [facility] ENDOSCOPY;  Service: Cardiovascular;  Laterality: N/A;  12:52 synched cardioversion at 120 joules,...afib to SR...12 lead EKG ordered.Marland KitchenMarland KitchenCardiozem d/c'ed per MD verbal order at SR  . TEE WITHOUT CARDIOVERSION N/A 03/23/2015   Procedure: TRANSESOPHAGEAL ECHOCARDIOGRAM (TEE);  Surgeon: Jerline Pain, MD;  Location: [facility];  Service: Cardiovascular;  Laterality: N/A;  . TEE WITHOUT CARDIOVERSION N/A 11/02/2019   Procedure: TRANSESOPHAGEAL ECHOCARDIOGRAM (TEE);  Surgeon: Josue Hector, MD;  Location: [facility] ENDOSCOPY;  Service: Cardiovascular;  Laterality: N/A;  . THORACIC SPINE SURGERY  03/2000   "ground calcium deposits from upper  thoracic" (01/26/2013)  . TOTAL HIP ARTHROPLASTY Right 06/2007    SOCIAL HISTORY:  Social History   Socioeconomic History  . Marital status: Married    Spouse name: Not on file  . Number of children: 3  . Years of education: Not on file  . Highest education level: Not on file  Occupational History  . Occupation: retired  Tobacco Use  . Smoking status: Former Smoker    Packs/day: 3.00    Years: 48.00    Pack years: 144.00    Types: Cigarettes    Quit date: 09/28/2000    Years since quitting: 20.1  . Smokeless tobacco: Never Used  Vaping Use  . Vaping Use: Never used  Substance and Sexual Activity  . Alcohol use: No  . Drug use: No  . Sexual activity: Yes  Other Topics Concern  . Not on file  Social History Narrative   Pt lives in [place] with spouse.  3 children are grown and healthy.   Retired.  Ran a country store for 30 years, previously worked in [employer] for 16  years   Social Determinants of Radio broadcast assistant Strain: Not on file  Food Insecurity: Not on file  Transportation Needs: No Transportation Needs  . Lack of Transportation (Medical): No  . Lack of Transportation (Non-Medical): No  Physical Activity: Inactive  . Days of Exercise per Week: 0 days  . Minutes of Exercise per Session: 0 min  Stress: Not on file  Social Connections: Not on file  Intimate Partner Violence: Not At Risk  . Fear of Current or Ex-Partner: No  . Emotionally Abused: No  . Physically Abused: No  . Sexually Abused: No    FAMILY HISTORY:  Family History  Problem Relation Age of Onset  . Kidney disease Mother   . Heart disease Mother        MI, open heart  . Diabetes Mother        dialysis  . Leukemia Father   . Colon cancer Paternal Uncle   . Lung cancer Paternal Uncle        x 2  . Prostate cancer Paternal Uncle   . Diabetes Maternal Grandmother   . Heart attack Maternal Uncle   . Diabetes Maternal Aunt        x 3  . Diabetes Maternal  Uncle     CURRENT MEDICATIONS:  Current Outpatient Medications  Medication Sig Dispense Refill  . carvedilol (COREG) 6.25 MG tablet Take 1 tablet (6.25 mg total) by mouth 2 (two) times daily. 180 tablet 3  . Cholecalciferol 25 MCG (1000 UT) capsule Take 1 capsule by mouth daily.    . dorzolamide-timolol (COSOPT) 22.3-6.8 MG/ML ophthalmic solution Place 1 drop into the right eye 2 (two) times daily.    [name] escitalopram (LEXAPRO) 20 MG tablet Take 1 tablet (20 mg total) by mouth daily. 90 tablet 0  . Evolocumab (REPATHA SURECLICK) 010 MG/ML SOAJ Inject 1 pen into the skin every 14 (fourteen) days. 2 pen 11  . ferrous sulfate 325 (65 FE) MG tablet Take 325 mg by mouth daily with breakfast.     . finasteride (PROSCAR) 5 MG tablet Take 1 tablet (5 mg total) by mouth daily. 30 tablet 2  . furosemide (LASIX) 20 MG tablet Take 2 tablets (40 mg total) by mouth 2 (two) times daily. 360 tablet 3  . gabapentin (NEURONTIN) 100 MG capsule Take 200 mg by mouth 2 (two) times daily.     [name] latanoprost (XALATAN) 0.005 % ophthalmic solution Place 1 drop into the right eye at bedtime.     . Netarsudil Dimesylate 0.02 % SOLN Place 1 drop into the right eye at bedtime.     . nitroGLYCERIN (NITROSTAT) 0.4 MG SL tablet Place 0.4 mg under the tongue every 5 (five) minutes as needed for chest pain.     . Rivaroxaban (XARELTO) 15 MG TABS tablet Take 1 tablet (15 mg total) by mouth daily with supper. 30 tablet 11  . sacubitril-valsartan (ENTRESTO) 24-26 MG Taking 1/2 tablet by mouth twice a day 30 tablet 5  . spironolactone (ALDACTONE) 25 MG tablet Take 0.5 tablets (12.5 mg total) by mouth daily. Take at bedtime 45 tablet 3  . tamsulosin (FLOMAX) 0.4 MG CAPS Take 0.4 mg by mouth at bedtime.      No current facility-administered medications for this visit.    ALLERGIES:  Allergies  Allergen Reactions  . Pravastatin Sodium Other (See Comments)    Joint and muscle pain  . Azithromycin Diarrhea    PHYSICAL EXAM:   Performance status (  ECOG): 1 - Symptomatic but completely ambulatory  Vitals:   11/03/20 1539  BP: 121/77  Pulse: 70  Resp: 17  Temp: 98.9 F (37.2 C)  SpO2: 98%   Wt Readings from Last 3 Encounters:  11/03/20 219 lb 4 oz (99.5 kg)  10/05/20 216 lb (98 kg)  07/27/20 219 lb 12.8 oz (99.7 kg)   Physical Exam Vitals reviewed.  Constitutional:      Appearance: Normal appearance. He is obese.  Cardiovascular:     Rate and Rhythm: Normal rate and regular rhythm.     Pulses: Normal pulses.     Heart sounds: Normal heart sounds.  Pulmonary:     Effort: Pulmonary effort is normal.     Breath sounds: Normal breath sounds.  Musculoskeletal:     Right lower leg: No edema.     Left lower leg: No edema.  Neurological:     General: No focal deficit present.     Mental Status: He is alert and oriented to person, place, and time.  Psychiatric:        Mood and Affect: Mood normal.        Behavior: Behavior normal.      LABORATORY DATA:  I have reviewed the labs as listed.  CBC Latest Ref Rng & Units 10/27/2020 07/25/2020 01/25/2020  WBC 4.0 - 10.5 K/uL 5.4 5.0 5.7  Hemoglobin 13.0 - 17.0 g/dL 11.4(L) 11.2(L) 11.3(L)  Hematocrit 39.0 - 52.0 % 35.7(L) 34.9(L) 36.1(L)  Platelets 150 - 400 K/uL 141(L) 153 128(L)   CMP Latest Ref Rng & Units 10/27/2020 07/25/2020 04/29/2020  Glucose 70 - 99 mg/dL 145(H) 101(H) 127(H)  BUN 8 - 23 mg/dL 17 19 20   Creatinine 0.61 - 1.24 mg/dL 1.34(H) 1.61(H) 1.89(H)  Sodium 135 - 145 mmol/L 137 140 140  Potassium 3.5 - 5.1 mmol/L 3.3(L) 4.8 4.1  Chloride 98 - 111 mmol/L 101 104 102  CO2 22 - 32 mmol/L 30 29 30   Calcium 8.9 - 10.3 mg/dL 8.4(L) 8.8(L) 8.1(L)  Total Protein 6.5 - 8.1 g/dL 7.0 6.9 -  Total Bilirubin 0.3 - 1.2 mg/dL 0.5 0.5 -  Alkaline Phos 38 - 126 U/L 70 65 -  AST 15 - 41 U/L 13(L) 13(L) -  ALT 0 - 44 U/L 14 14 -   Lab Results  Component Value Date   LDH 133 10/27/2020   LDH 128 01/25/2020   LDH 143 07/27/2019   Lab Results   Component Value Date   TIBC 277 10/27/2020   TIBC 280 07/25/2020   TIBC 270 01/25/2020   FERRITIN 165 10/27/2020   FERRITIN 166 07/25/2020   FERRITIN 221 01/25/2020   IRONPCTSAT 20 10/27/2020   IRONPCTSAT 20 07/25/2020   IRONPCTSAT 27 01/25/2020    DIAGNOSTIC IMAGING:  I have independently reviewed the scans and discussed with the patient. CT Chest Wo Contrast  Result Date: 10/27/2020 CLINICAL DATA:  Lung nodule follow-up. EXAM: CT CHEST WITHOUT CONTRAST TECHNIQUE: Multidetector CT imaging of the chest was performed following the standard protocol without IV contrast. COMPARISON:  October of 2021 FINDINGS: Cardiovascular: 3 lead pacer defibrillator remains in place. Calcified atheromatous plaque in the thoracic aorta. No aneurysmal dilation. No change from prior imaging. 3.6 cm central pulmonary artery, main pulmonary artery caliber. Heart size mild to moderately enlarged without pericardial effusion. Mediastinum/Nodes: 1.8 cm RIGHT thyroid nodule with calcification no thoracic inlet adenopathy. No axillary lymphadenopathy. No mediastinal lymphadenopathy. Mild fullness of mediastinal lymph nodes with stable appearance. Lungs/Pleura: Poorly marginated nodules  with peripheral ground-glass have increased in number when compared to the prior study. These are predominantly in the RIGHT lung but are seen throughout both the LEFT and RIGHT chest. Nodule seen in the RIGHT lung apex on the prior study is less discrete on image 32 of series 4 but shows similar size approximately 5 mm. On this image there are 3 new areas of nodularity that have developed since the prior study, the largest measuring approximately 9 x 6 mm. On image 57 of series 4 there is evidence of at least 3 of not 4 new nodules. Existing nodules present on the prior study show mixed appearance. (Image 55, series 4) previously demonstrated 7 mm nodule was previously solid now ground-glass. In the anterior chest a 6 mm nodule previously was  7 mm but associated now with more surrounding indistinct ground-glass and septal thickening. New nodule just medial to this on image 57. New nodule along the posterior RIGHT upper lobe along the major fissure (image 51, series 4) Bronchovascular nodularity on image 67 of series 4 tracking along the RIGHT upper lobe bronchi. Similar appearance of RIGHT middle lobe with new nodules in this location as well best seen on image 100 of series 4 with 2 discrete 5-6 mm pulmonary nodules. Numerous nodules in the superior segment of the RIGHT lower lobe with either increasing conspicuity or development of new nodularity. Some signs of septal thickening and ground-glass. New LEFT upper lobe nodule (image 76, series 4) at least 6 LEFT upper lobe nodules with scattered nodules also in the superior segment of the LEFT lower lobe. Airways are patent. No effusion. Upper Abdomen: Incidental imaging of upper abdominal contents without acute process. Low attenuation along the anterior margin of the pancreas appears more conspicuous than in 2020 measuring up to 1.9 x 1.8 cm (image 141, series 2) Musculoskeletal: Spinal degenerative changes. No acute or destructive bone process. Signs of thoracic laminectomy at multiple levels as seen on prior imaging in the lower thoracic spine. IMPRESSION: 1. Worsening of nodularity in the chest. Some areas appear less conspicuous though there are numerous new areas when compared to prior imaging. Given rapid change infection is still in the differential. Metastatic disease does remain a differential consideration given worsening. Pulmonary consultation may be helpful. Ultimately bronchoscopic assessment and biopsy and or sampling may be helpful for further evaluation as warranted. Findings would be atypical for COVID infection but would also correlate with any new symptoms that would suggest COVID infection. 2. Low attenuation along the anterior margin of the pancreas appears more conspicuous than in  2020 measuring up to 1.9 x 1.8 cm. Findings are concerning for a cystic pancreatic neoplasm. Further evaluation with pancreatic protocol CT is suggested given the cardiac pacer defibrillator that is noted on the current study which may preclude MRI. 3. 1.8 cm RIGHT thyroid nodule with calcification. No follow-up recommended unless clinically warranted, finding is unchanged over a series of prior exams.(Ref: J Am Coll Radiol. 2015 Feb;12(2): 143-50). 4. Mild to moderate cardiomegaly. 5. Signs of thoracic laminectomy at multiple levels as seen on prior imaging. 6. Aortic atherosclerosis. Aortic Atherosclerosis (ICD10-I70.0). Electronically Signed   By: Zetta Bills M.D.   On: 10/27/2020 13:06   CUP PACEART INCLINIC DEVICE CHECK  Result Date: 10/05/2020 CRT-D device check in office. Thresholds and sensing consistent with previous device measurements. Lead impedance trends stable over time. 2 AT/AF episodes recorded (<0.1% burden), longest episode appear AF <9 minutes, Peak A/V rate 333/111. +OAC.  No ventricular arrhythmia  episodes recorded. Patient bi-ventricularly pacing 99.3% of the time. Device programmed with appropriate safety margins. Heart failure diagnostics reviewed and trends are stable for patient. Audible alerts demonstrated for patient.  No changes made this session. Estimated longevity 2 years.  Patient enrolled in remote follow up, next scheduled check 12/01/20. Patient education completed including shock plan.Trena Platt, BSN, RN    ASSESSMENT:  1. Lung nodules: -He was neck smoker, smoked more than 2 packs/day for many years and quit in 2001. -CT chest on 07/25/2020 showed numerous (greater than 10) new poorly marginated subsolid pulmonary nodules scattered throughout both lungs, largest 1.3 cm in the posterior right middle lobe.  Infectious/inflammatory nodularity is more likely with pulmonary metastasis not entirely excluded.  No adenopathy.  2. T2 N0 melanoma of the left  thigh: -Status post resection in 2015 at [facility].  3. CKD: -Follow-up with Dr. Theador Hawthorne.  4. Mild thrombocytopenia: -He has mild intermittent thrombocytopenia of several years duration.  5. Normocytic anemia: -Combination anemia from CKD and relative iron deficiency.   PLAN:  1. Lung nodules: -He has some congestion, particularly in the throat region.  Lungs are clear to auscultation. -Reviewed images of the CT scan of the chest from 10/27/2020.  There are some new nodules and and some nodule disappeared from prior scan.  Overall there is slight worsening of nodularity in the chest.  All of them are subcentimeter. -Recommend repeat CT scan in 4 months. -We also discussed about the pancreatic lesion measuring 1.9 x 1.8 cm, slightly more conspicuous than noticed in 2020.  We will follow up on the next CT scan.  If there is any significant changes, will consider CT pancreatic protocol.  2. CKD: -Creatinine improved to 1.34.  3. Mild thrombocytopenia: -He has intermittent very mild thrombocytopenia.  Platelet count is 141.  4. Normocytic anemia: -Hemoglobin is 11.4.  Ferritin is 165 with percent saturation of 20. -Normocytic anemia from combination of CKD and relative iron deficiency. -Recommended starting iron tablet every other day.  Taking it every day causes constipation.   Orders placed this encounter:  No orders of the defined types were placed in this encounter.    Derek Jack, MD [place] [phone]   I, Milinda Antis, am acting as a scribe for Dr. Sanda Linger.  I, Derek Jack MD, have reviewed the above documentation for accuracy and completeness, and I agree with the above.

## 2020-11-03 NOTE — Patient Instructions (Signed)
[place] at [facility] Discharge Instructions  You were seen today by Dr. Delton Coombes. He went over your recent results and scans. You will be scheduled for a CT scan of your chest before your next visit. Start taking 1 iron tablet every other day or every 3 days. Dr. Delton Coombes will see you back in 4 months for labs and follow up.   Thank you for choosing [place] at [facility] to provide your oncology and hematology care.  To afford each patient quality time with our provider, please arrive at least 15 minutes before your scheduled appointment time.   If you have a lab appointment with the [facility] please come in thru the Main Entrance and check in at the main information desk  You need to re-schedule your appointment should you arrive 10 or more minutes late.  We strive to give you quality time with our providers, and arriving late affects you and other patients whose appointments are after yours.  Also, if you no show three or more times for appointments you may be dismissed from the clinic at the providers discretion.     Again, thank you for choosing [facility].  Our hope is that these requests will decrease the amount of time that you wait before being seen by our physicians.       _____________________________________________________________  Should you have questions after your visit to [facility], please contact our office at [phone] between the hours of 8:00 a.m. and 4:30 p.m.  Voicemails left after 4:00 p.m. will not be returned until the following business day.  For prescription refill requests, have your pharmacy contact our office and allow 72 hours.    Cancer Center Support Programs:   > Cancer Support Group  2nd Tuesday of the month 1pm-2pm, Journey Room

## 2020-11-07 ENCOUNTER — Ambulatory Visit (INDEPENDENT_AMBULATORY_CARE_PROVIDER_SITE_OTHER): Payer: Medicare PPO

## 2020-11-07 DIAGNOSIS — Z95810 Presence of automatic (implantable) cardiac defibrillator: Secondary | ICD-10-CM | POA: Diagnosis not present

## 2020-11-07 DIAGNOSIS — I5022 Chronic systolic (congestive) heart failure: Secondary | ICD-10-CM

## 2020-11-11 ENCOUNTER — Telehealth: Payer: Self-pay

## 2020-11-11 NOTE — Telephone Encounter (Signed)
Remote ICM transmission received.  Attempted call to patient regarding ICM remote transmission and left detailed message per DPR.  Advised to return call for any fluid symptoms or questions. Next ICM remote transmission scheduled 12/12/2020.     

## 2020-11-11 NOTE — Progress Notes (Signed)
EPIC Encounter for ICM Monitoring  Patient Name: Chad Brown is a 80 y.o. male Date: 11/11/2020 Primary Care Physican: Marton Redwood, MD Primary Cardiologist:Nishan/McLean Electrophysiologist: Allred Bi-V Pacing:98.9% 10/05/2020 Office Weight: 216lbs (baseline)  Time in AT/AF0.0 hr/day (0.0%)(taking Xarelto)  Attempted call to patient and unable to reach. Left detailed message per DPR regarding transmission. Transmission reviewed.  Optivol thoracic impedancenormal.  Prescribed:  Furosemide20 mg2tablets(40 mg total)twice a day.  Spironolactone 25 mgTake 0.5 tablet (12.5 mg total) by mouth daily. Take at bedtime  Labs: 10/27/2020 Creatinine 1.34, BUN 17, Potassium 3.3, Sodium 137, GFR 54 07/25/2020 Creatinine 1.61, BUN 19, Potassium 4.8, Sodium 140, GFR 43 04/29/2020 Creatinine 1.89, BUN 20, Potassium 4.1, Sodium 140, GFR 33-38 03/30/2020 Creatinine 1.83, BUN 19, Potassium 4.3, Sodium 144, GFR 34-40 03/16/2020 Creatinine 1.72, BUN 19, Potassium 5.3, Sodium 141, GFR 37-43 03/09/2020 Creatinine 1.58, BUN 19, Potassium 4.3, Sodium 137, GFR 41-48 Care Everywhere 01/25/2020 Creatinine 2.54, BUN 34, Potassium 5.0, Sodium 141, GFR 23-27 A complete set of results can be found in Results Review.  Recommendations:Left voice mail with ICM number and encouraged to call if experiencing any fluid symptoms.  Follow-up plan: ICM clinic phone appointment on3/14/2022. 91 day device clinic remote transmission3/11/2020.  EP/Cardiology NextOfficeVisit: Recall 1/7/2023with Dr Rayann Heman  Copy of ICM check sent to Dr.Allred.  3 month ICM trend: 11/07/2020.    1 Year ICM trend:       Rosalene Billings, RN 11/11/2020 10:57 AM

## 2020-11-14 ENCOUNTER — Other Ambulatory Visit: Payer: Self-pay | Admitting: Cardiology

## 2020-11-15 DIAGNOSIS — D696 Thrombocytopenia, unspecified: Secondary | ICD-10-CM | POA: Diagnosis not present

## 2020-11-15 DIAGNOSIS — I129 Hypertensive chronic kidney disease with stage 1 through stage 4 chronic kidney disease, or unspecified chronic kidney disease: Secondary | ICD-10-CM | POA: Diagnosis not present

## 2020-11-15 DIAGNOSIS — E211 Secondary hyperparathyroidism, not elsewhere classified: Secondary | ICD-10-CM | POA: Diagnosis not present

## 2020-11-15 DIAGNOSIS — I5042 Chronic combined systolic (congestive) and diastolic (congestive) heart failure: Secondary | ICD-10-CM | POA: Diagnosis not present

## 2020-11-15 DIAGNOSIS — N1832 Chronic kidney disease, stage 3b: Secondary | ICD-10-CM | POA: Diagnosis not present

## 2020-11-15 DIAGNOSIS — D638 Anemia in other chronic diseases classified elsewhere: Secondary | ICD-10-CM | POA: Diagnosis not present

## 2020-11-18 DIAGNOSIS — I5042 Chronic combined systolic (congestive) and diastolic (congestive) heart failure: Secondary | ICD-10-CM | POA: Diagnosis not present

## 2020-11-18 DIAGNOSIS — R918 Other nonspecific abnormal finding of lung field: Secondary | ICD-10-CM | POA: Diagnosis not present

## 2020-11-18 DIAGNOSIS — E211 Secondary hyperparathyroidism, not elsewhere classified: Secondary | ICD-10-CM | POA: Diagnosis not present

## 2020-11-18 DIAGNOSIS — D638 Anemia in other chronic diseases classified elsewhere: Secondary | ICD-10-CM | POA: Diagnosis not present

## 2020-11-18 DIAGNOSIS — D696 Thrombocytopenia, unspecified: Secondary | ICD-10-CM | POA: Diagnosis not present

## 2020-11-18 DIAGNOSIS — N1831 Chronic kidney disease, stage 3a: Secondary | ICD-10-CM | POA: Diagnosis not present

## 2020-11-24 ENCOUNTER — Encounter: Payer: Self-pay | Admitting: Internal Medicine

## 2020-11-24 ENCOUNTER — Ambulatory Visit: Payer: Medicare PPO | Admitting: Internal Medicine

## 2020-11-24 ENCOUNTER — Other Ambulatory Visit: Payer: Self-pay

## 2020-11-24 VITALS — BP 118/72 | HR 86 | Temp 97.8°F | Ht 66.0 in | Wt 218.0 lb

## 2020-11-24 DIAGNOSIS — R053 Chronic cough: Secondary | ICD-10-CM

## 2020-11-24 DIAGNOSIS — Z85820 Personal history of malignant melanoma of skin: Secondary | ICD-10-CM | POA: Diagnosis not present

## 2020-11-24 DIAGNOSIS — R918 Other nonspecific abnormal finding of lung field: Secondary | ICD-10-CM

## 2020-11-24 DIAGNOSIS — Z87891 Personal history of nicotine dependence: Secondary | ICD-10-CM | POA: Diagnosis not present

## 2020-11-24 NOTE — Patient Instructions (Addendum)
ICD-10-CM   1. Chronic cough  R05.3   2. Multiple pulmonary nodules determined by computed tomography of lung  R91.8   3. Stopped smoking with greater than 40 pack year history  Z87.891   4. Personal history of malignant melanoma  Z85.820    -We do not know at this point if your cough and multiple lung nodules are related -Cough could be because of COPD or ENTRESTO -Pulmonary nodules could be because of indolent infection, inflammation, cancer  Plan - No need to stop entresto for the moment given benefits of the heart  -Start empiric Spiriva Respimat 2 puffs once daily [take samples with few months] -Do full pulmonary function test -Do blood work: ACE, ANA, double-stranded DNA, rheumatoid factor, CCP, SSA, SSB, SCL 70, MPO, PR-3, GBM antibody and QuantiFERON gold -Do PET scan  Follow-up -Return to see Dr. Leory Plowman Icard in the next few to several weeks but after completing all of the above  -He might require additional testing based on his evaluation

## 2020-11-24 NOTE — Addendum Note (Signed)
Addended by: Suzzanne Cloud E on: 11/24/2020 10:18 AM   Modules accepted: Orders

## 2020-11-24 NOTE — Progress Notes (Signed)
OV 11/24/2020  Subjective:  Patient ID: Chad Brown, male , DOB: 1941/08/08 , age 80 y.o. , MRN: [id number] , ADDRESS: [place] [id number] PCP Marton Redwood, MD Patient Care Team: Marton Redwood, MD as PCP - General (Internal Medicine) Josue Hector, MD as PCP - Cardiology (Cardiology) Sueanne Margarita, MD as PCP - Sleep Medicine (Cardiology)  This Provider for this visit: Treatment Team:  Attending Provider: Brand Males, MD    11/24/2020 -   Chief Complaint  Patient presents with  . Consult    Chronic cough, ILD on xray     HPI Chad Brown 80 y.o. -new consult evaluation.  Former greater than 100 pack smoker quit 20 years ago.  History of melanoma in his left thigh status post excision 6 years ago.  Follows with oncologist in [place] but primary care is in [place].  He says in routine follow-up CT scan of the chest [he says he is known to have waxing and waning nodules] he has had enlargement of multiple pulmonary nodules and therefore his oncologist suggested he come you.  His wife who is here with him tells me that he also has a chronic cough for the last few to several months.  Is mostly at night clear sputum occasionally green.  No change in baseline shortness of breath from obesity and cardiomyopathy.  No wheezing.  He is worried about the nodules and the symptoms and therefore he is here.  No weight loss.  No fever or chills no B symptoms.  I personally visualized the scan below and showed in the scan and discussed the results.  There is no aspiration history no autoimmune history.  He is noticed to be on Entresto   SYMPTOM SCALE - ILD 11/24/2020   O2 use ra  Shortness of Breath 0 -> 5 scale with 5 being worst (score 6 If unable to do)  At rest 3  Simple tasks - showers, clothes change, eating, shaving 1  Household (dishes, doing bed, laundry) 3  Shopping 3  Walking level at own pace 4  Walking up Stairs 5  Total (30-36)  Dyspnea Score 19  How bad is your cough? 4.5  How bad is your fatigue 3  How bad is nausea 0  How bad is vomiting?  0  How bad is diarrhea? 4  How bad is anxiety? 3  How bad is depression 1       CT Chest data - Jan 2022  Narrative & Impression  CLINICAL DATA:  Lung nodule follow-up.  EXAM: CT CHEST WITHOUT CONTRAST  TECHNIQUE: Multidetector CT imaging of the chest was performed following the standard protocol without IV contrast.  COMPARISON:  October of 2021  FINDINGS: Cardiovascular: 3 lead pacer defibrillator remains in place. Calcified atheromatous plaque in the thoracic aorta. No aneurysmal dilation. No change from prior imaging. 3.6 cm central pulmonary artery, main pulmonary artery caliber. Heart size mild to moderately enlarged without pericardial effusion.  Mediastinum/Nodes: 1.8 cm RIGHT thyroid nodule with calcification no thoracic inlet adenopathy. No axillary lymphadenopathy. No mediastinal lymphadenopathy. Mild fullness of mediastinal lymph nodes with stable appearance.  Lungs/Pleura: Poorly marginated nodules with peripheral ground-glass have increased in number when compared to the prior study. These are predominantly in the RIGHT lung but are seen throughout both the LEFT and RIGHT chest.  Nodule seen in the RIGHT lung apex on the prior study is less discrete on image 32 of series 4  but shows similar size approximately 5 mm. On this image there are 3 new areas of nodularity that have developed since the prior study, the largest measuring approximately 9 x 6 mm.  On image 57 of series 4 there is evidence of at least 3 of not 4 new nodules. Existing nodules present on the prior study show mixed appearance. (Image 55, series 4) previously demonstrated 7 mm nodule was previously solid now ground-glass. In the anterior chest a 6 mm nodule previously was 7 mm but associated now with more surrounding indistinct ground-glass and septal  thickening. New nodule just medial to this on image 57. New nodule along the posterior RIGHT upper lobe along the major fissure (image 51, series 4)  Bronchovascular nodularity on image 67 of series 4 tracking along the RIGHT upper lobe bronchi.  Similar appearance of RIGHT middle lobe with new nodules in this location as well best seen on image 100 of series 4 with 2 discrete 5-6 mm pulmonary nodules.  Numerous nodules in the superior segment of the RIGHT lower lobe with either increasing conspicuity or development of new nodularity. Some signs of septal thickening and ground-glass.  New LEFT upper lobe nodule (image 76, series 4) at least 6 LEFT upper lobe nodules with scattered nodules also in the superior segment of the LEFT lower lobe. Airways are patent. No effusion.  Upper Abdomen: Incidental imaging of upper abdominal contents without acute process. Low attenuation along the anterior margin of the pancreas appears more conspicuous than in 2020 measuring up to 1.9 x 1.8 cm (image 141, series 2)  Musculoskeletal: Spinal degenerative changes. No acute or destructive bone process. Signs of thoracic laminectomy at multiple levels as seen on prior imaging in the lower thoracic spine.  IMPRESSION: 1. Worsening of nodularity in the chest. Some areas appear less conspicuous though there are numerous new areas when compared to prior imaging. Given rapid change infection is still in the differential. Metastatic disease does remain a differential consideration given worsening. Pulmonary consultation may be helpful. Ultimately bronchoscopic assessment and biopsy and or sampling may be helpful for further evaluation as warranted. Findings would be atypical for COVID infection but would also correlate with any new symptoms that would suggest COVID infection.  2. Low attenuation along the anterior margin of the pancreas appears more conspicuous than in 2020 measuring up to 1.9  x 1.8 cm. Findings are concerning for a cystic pancreatic neoplasm. Further evaluation with pancreatic protocol CT is suggested given the cardiac pacer defibrillator that is noted on the current study which may preclude MRI.  3. 1.8 cm RIGHT thyroid nodule with calcification. No follow-up recommended unless clinically warranted, finding is unchanged over a series of prior exams.(Ref: J Am Coll Radiol. 2015 Feb;12(2): 143-50). 4. Mild to moderate cardiomegaly. 5. Signs of thoracic laminectomy at multiple levels as seen on prior imaging. 6. Aortic atherosclerosis.  Aortic Atherosclerosis (ICD10-I70.0).   Electronically Signed   By: Zetta Bills M.D.   On: 10/27/2020 13:06        PFT  PFT Results Latest Ref Rng & Units 11/18/2018  FVC-Pre L 2.62  FVC-Predicted Pre % 78  FVC-Post L 2.89  FVC-Predicted Post % 86  Pre FEV1/FVC % % 71  Post FEV1/FCV % % 76  FEV1-Pre L 1.86  FEV1-Predicted Pre % 78  FEV1-Post L 2.19  DLCO uncorrected ml/min/mmHg 16.65  DLCO UNC% % 78  DLCO corrected ml/min/mmHg 18.73  DLCO COR %Predicted % 88  DLVA Predicted % 96  TLC  L 6.02  TLC % Predicted % 99  RV % Predicted % 139       has a past medical history of Adenomatous colon polyp, AICD (automatic cardioverter/defibrillator) present (03/08/2015), Anemia, Arthritis, Atherosclerosis, Cataract, Cholelithiasis, Chronic systolic CHF (congestive heart failure) ([place]), Colon polyp, hyperplastic (5/16), Depression, Diverticulosis, Glaucoma, Hyperlipidemia, Hypertension, Melanoma of eye ([place]) (2000's), Melanoma of lower leg ([place]) (2015), Myocardial infarction ([place]) (1998), OSA (obstructive sleep apnea) (01/04/2016), Peripheral vision loss, right (2006), Persistent atrial fibrillation ([place]), and Urinary hesitancy due to benign prostatic hypertrophy.   reports that he quit smoking about 20 years ago. His smoking use included cigarettes. He has a 144.00 pack-year smoking history. He has never used  smokeless tobacco.  Past Surgical History:  Procedure Laterality Date  . ATRIAL FIBRILLATION ABLATION N/A 11/03/2019   Procedure: ATRIAL FIBRILLATION ABLATION;  Surgeon: Thompson Grayer, MD;  Location: [facility];  Service: Cardiovascular;  Laterality: N/A;  . BACK SURGERY     upper back, cannot turn neck well  . CARDIAC CATHETERIZATION  1998  . CARDIOVERSION N/A 01/26/2013   Procedure: CARDIOVERSION;  Surgeon: Lelon Perla, MD;  Location: [facility] ENDOSCOPY;  Service: Cardiovascular;  Laterality: N/A;  . CARDIOVERSION N/A 03/23/2015   Procedure: CARDIOVERSION;  Surgeon: Jerline Pain, MD;  Location: [facility] ENDOSCOPY;  Service: Cardiovascular;  Laterality: N/A;  . CARDIOVERSION N/A 08/14/2017   Procedure: CARDIOVERSION;  Surgeon: Josue Hector, MD;  Location: [place];  Service: Cardiovascular;  Laterality: N/A;  . CATARACT EXTRACTION Right ~ 2006  . COLONOSCOPY WITH PROPOFOL N/A 02/10/2015   Procedure: COLONOSCOPY WITH PROPOFOL;  Surgeon: Gatha Mayer, MD;  Location: [facility] ENDOSCOPY;  Service: Endoscopy;  Laterality: N/A;  . COLONOSCOPY WITH PROPOFOL N/A 08/07/2016   Procedure: COLONOSCOPY WITH PROPOFOL;  Surgeon: Gatha Mayer, MD;  Location: [facility] ENDOSCOPY;  Service: Endoscopy;  Laterality: N/A;  . ENTEROSCOPY N/A 08/17/2015   Procedure: ENTEROSCOPY;  Surgeon: Gatha Mayer, MD;  Location: [facility] ENDOSCOPY;  Service: Endoscopy;  Laterality: N/A;  . EP IMPLANTABLE DEVICE N/A 03/08/2015   MDT Hillery Aldo CRT-D for nonischemic CM by Dr Rayann Heman for primary prevention  . GLAUCOMA SURGERY Right ~ 2006   "put 3 stents in to drain fluid" (03/08/2015) not successful, sent to [facility] to try to get last stent out  . HOT HEMOSTASIS N/A 08/07/2016   Procedure: HOT HEMOSTASIS (ARGON PLASMA COAGULATION/BICAP);  Surgeon: Gatha Mayer, MD;  Location: [facility];  Service: Endoscopy;  Laterality: N/A;  . INCISION AND DRAINAGE ABSCESS POSTERIOR CERVICALSPINE  05/2012  . JOINT REPLACEMENT    . MELANOMA EXCISION Left  2015   "lower leg; right at my knee"  . REFRACTIVE SURGERY Right ~ 2006 X 2   "twice; both done at [facility]" (03/08/2015  . SURGERY SCROTAL / TESTICULAR Right 1990's  . TEE WITHOUT CARDIOVERSION N/A 01/26/2013   Procedure: TRANSESOPHAGEAL ECHOCARDIOGRAM (TEE);  Surgeon: Lelon Perla, MD;  Location: [facility];  Service: Cardiovascular;  Laterality: N/A;  [name] anes. /   . TEE WITHOUT CARDIOVERSION N/A 10/05/2014   Procedure: TRANSESOPHAGEAL ECHOCARDIOGRAM (TEE)  with cardioversion;  Surgeon: Thayer Headings, MD;  Location: [facility] ENDOSCOPY;  Service: Cardiovascular;  Laterality: N/A;  12:52 synched cardioversion at 120 joules,...afib to SR...12 lead EKG ordered.Marland KitchenMarland KitchenCardiozem d/c'ed per MD verbal order at SR  . TEE WITHOUT CARDIOVERSION N/A 03/23/2015   Procedure: TRANSESOPHAGEAL ECHOCARDIOGRAM (TEE);  Surgeon: Jerline Pain, MD;  Location: [facility] ENDOSCOPY;  Service: Cardiovascular;  Laterality: N/A;  . TEE WITHOUT  CARDIOVERSION N/A 11/02/2019   Procedure: TRANSESOPHAGEAL ECHOCARDIOGRAM (TEE);  Surgeon: Josue Hector, MD;  Location: [facility] ENDOSCOPY;  Service: Cardiovascular;  Laterality: N/A;  . THORACIC SPINE SURGERY  03/2000   "ground calcium deposits from upper thoracic" (01/26/2013)  . TOTAL HIP ARTHROPLASTY Right 06/2007    Allergies  Allergen Reactions  . Pravastatin Sodium Other (See Comments)    Joint and muscle pain  . Azithromycin Diarrhea    Immunization History  Administered Date(s) Administered  . Fluad Quad(high Dose 65+) 06/10/2019  . Influenza Inj Mdck Quad With Preservative 06/27/2018  . Influenza, High Dose Seasonal PF 06/29/2016, 07/04/2017, 06/29/2020  . Influenza, Seasonal, Injecte, Preservative Fre 08/02/2015  . Influenza-Unspecified 07/11/2014, 07/18/2015  . Moderna Sars-Covid-2 Vaccination 12/08/2019, 12/28/2019, 07/27/2020  . Pneumococcal Conjugate-13 08/23/2014  . Pneumococcal Polysaccharide-23 10/01/2010, 03/04/2018  . Pneumococcal-Unspecified 06/02/2011  . Td 10/01/2008   . Tdap 12/16/2014  . Zoster 10/01/2010, 08/01/2013, 08/20/2013  . Zoster Recombinat (Shingrix) 12/24/2017, 12/27/2017, 03/04/2018    Family History  Problem Relation Age of Onset  . Kidney disease Mother   . Heart disease Mother        MI, open heart  . Diabetes Mother        dialysis  . Leukemia Father   . Colon cancer Paternal Uncle   . Lung cancer Paternal Uncle        x 2  . Prostate cancer Paternal Uncle   . Diabetes Maternal Grandmother   . Heart attack Maternal Uncle   . Diabetes Maternal Aunt        x 3  . Diabetes Maternal Uncle      Current Outpatient Medications:  .  carvedilol (COREG) 12.5 MG tablet, Take 12.5 mg by mouth 2 (two) times daily with a meal., Disp: , Rfl:  .  dorzolamide-timolol (COSOPT) 22.3-6.8 MG/ML ophthalmic solution, Place 1 drop into the right eye 2 (two) times daily., Disp: , Rfl:  .  escitalopram (LEXAPRO) 20 MG tablet, Take 1 tablet (20 mg total) by mouth daily., Disp: 90 tablet, Rfl: 0 .  Evolocumab (REPATHA SURECLICK) 630 MG/ML SOAJ, Inject 1 pen into the skin every 14 (fourteen) days. NEEDS APPOINTMENT FOR FUTURE REFILLS, Disp: 2 mL, Rfl: 0 .  finasteride (PROSCAR) 5 MG tablet, Take 1 tablet (5 mg total) by mouth daily., Disp: 30 tablet, Rfl: 2 .  furosemide (LASIX) 40 MG tablet, Take 40 mg by mouth in the morning and at bedtime., Disp: , Rfl:  .  gabapentin (NEURONTIN) 100 MG capsule, Take 200 mg by mouth 2 (two) times daily. , Disp: , Rfl:  .  latanoprost (XALATAN) 0.005 % ophthalmic solution, Place 1 drop into the right eye at bedtime. , Disp: , Rfl:  .  Netarsudil Dimesylate 0.02 % SOLN, Apply 1 drop to eye daily., Disp: , Rfl:  .  nitroGLYCERIN (NITROSTAT) 0.4 MG SL tablet, Place 0.4 mg under the tongue every 5 (five) minutes as needed for chest pain. , Disp: , Rfl:  .  Polyethyl Glycol-Propyl Glycol 0.4-0.3 % SOLN, Apply 1 drop to eye in the morning and at bedtime., Disp: , Rfl:  .  Rivaroxaban (XARELTO) 15 MG TABS tablet, Take 1  tablet (15 mg total) by mouth daily with supper., Disp: 30 tablet, Rfl: 11 .  sacubitril-valsartan (ENTRESTO) 24-26 MG, Taking 1/2 tablet by mouth twice a day, Disp: 30 tablet, Rfl: 5 .  spironolactone (ALDACTONE) 25 MG tablet, Take 0.5 tablets (12.5 mg total) by mouth daily. Take at bedtime, Disp: 45  tablet, Rfl: 3 .  tamsulosin (FLOMAX) 0.4 MG CAPS, Take 0.4 mg by mouth at bedtime. , Disp: , Rfl:       Objective:   Vitals:   11/24/20 0908  BP: 118/72  Pulse: 86  Temp: 97.8 F (36.6 C)  TempSrc: Oral  SpO2: 95%  Weight: 218 lb (98.9 kg)  Height: 5\' 6"  (1.676 m)    Estimated body mass index is 35.19 kg/m as calculated from the following:   Height as of this encounter: 5\' 6"  (1.676 m).   Weight as of this encounter: 218 lb (98.9 kg).  @WEIGHTCHANGE @  [employer]   11/24/20 0908  Weight: 218 lb (98.9 kg)     Physical Exam  General Appearance:    Alert, cooperative, no distress, appears stated age - looks well , Deconditioned looking - no , OBESE  - yes, Sitting on Wheelchair -  no  Head:    Normocephalic, without obvious abnormality, atraumatic  Eyes:    PERRL, conjunctiva/corneas clear,  Ears:    Normal TM's and external ear canals, both ears  Nose:   Nares normal, septum midline, mucosa normal, no drainage    or sinus tenderness. OXYGEN ON  - no . Patient is @ ra   Throat:   Lips, mucosa, and tongue normal; teeth and gums normal. Cyanosis on lips - no  Neck:   Supple, symmetrical, trachea midline, no adenopathy;    thyroid:  no enlargement/tenderness/nodules; no carotid   bruit or JVD  Back:     Symmetric, no curvature, ROM normal, no CVA tenderness  Lungs:     Distress - no , Wheeze no, Barrell Chest - no, Purse lip breathing - no, Crackles - no   Chest Wall:    No tenderness or deformity.    Heart:    Regular rate and rhythm, S1 and S2 normal, no rub   or gallop, Murmur - no  Breast Exam:    NOT DONE  Abdomen:     Soft, non-tender, bowel sounds active all four  quadrants,    no masses, no organomegaly. Visceral obesity - yes  Genitalia:   NOT DONE  Rectal:   NOT DONE  Extremities:   Extremities - normal, Has Cane - no, Clubbing - no, Edema - no  Pulses:   2+ and symmetric all extremities  Skin:   Stigmata of Connective Tissue Disease - no  Lymph nodes:   Cervical, supraclavicular, and axillary nodes normal  Psychiatric:  Neurologic:   Pleasant - yes, Anxious - no, Flat affect - no  CAm-ICU - neg, Alert and Oriented x 3 - yes, Moves all 4s - yes, Speech - normal, Cognition - intact        Assessment:       ICD-10-CM   1. Chronic cough  R05.3   2. Multiple pulmonary nodules determined by computed tomography of lung  R91.8   3. Stopped smoking with greater than 40 pack year history  Z87.891   4. Personal history of malignant melanoma  Z85.820        Plan:     Patient Instructions     ICD-10-CM   1. Chronic cough  R05.3   2. Multiple pulmonary nodules determined by computed tomography of lung  R91.8   3. Stopped smoking with greater than 40 pack year history  Z87.891   4. Personal history of malignant melanoma  Z85.820    -We do not know at this point if your cough and  multiple lung nodules are related -Cough could be because of COPD -Pulmonary nodules could be because of indolent infection, inflammation, cancer  Plan  -Start empiric Spiriva Respimat 2 puffs once daily [take samples with few months] -Do full pulmonary function test -Do blood work: ANA, double-stranded DNA, rheumatoid factor, CCP, SSA, SSB, SCL 70, MPO, PR-3, GBM antibody and QuantiFERON gold -Do PET scan  Follow-up -Return to see Dr. Leory Plowman Icard in the next few to several weeks but after completing all of the above  -He might require additional testing based on his evaluation     SIGNATURE    Dr. Brand Males, M.D., F.C.C.P,  Pulmonary and Critical Care Medicine Staff Physician, [place] Director - Interstitial Lung Disease   Program  Pulmonary [employer] at [place], [place], [address]  Pager: [phone], If no answer or between  15:00h - 7:00h: call [phone] Telephone: [phone]  9:43 AM 11/24/2020

## 2020-11-26 LAB — SJOGRENS SYNDROME-B EXTRACTABLE NUCLEAR ANTIBODY: SSB (La) (ENA) Antibody, IgG: <1 AI

## 2020-11-26 LAB — MPO/PR-3 (ANCA) ANTIBODIES
Myeloperoxidase Abs: <1 AI
Serine Protease 3: <1 AI

## 2020-11-26 LAB — QUANTIFERON-TB GOLD PLUS
Mitogen-NIL: 9.91 IU/mL
NIL: 0.03 IU/mL
QuantiFERON-TB Gold Plus: NEGATIVE
TB1-NIL: 0 IU/mL
TB2-NIL: 0 IU/mL

## 2020-11-26 LAB — CYCLIC CITRUL PEPTIDE ANTIBODY, IGG: Cyclic Citrullin Peptide Ab: <16 UNITS

## 2020-11-26 LAB — ANTI-DNA ANTIBODY, DOUBLE-STRANDED: ds DNA Ab: <1 IU/mL

## 2020-11-26 LAB — RHEUMATOID FACTOR: Rheumatoid fact SerPl-aCnc: 24 IU/mL — ABNORMAL HIGH (ref <14)

## 2020-11-26 LAB — ANGIOTENSIN CONVERTING ENZYME: Angiotensin-Converting Enzyme: 14 U/L (ref 9–67)

## 2020-11-26 LAB — ANA: Anti Nuclear Antibody (ANA): NEGATIVE

## 2020-11-26 LAB — SJOGRENS SYNDROME-A EXTRACTABLE NUCLEAR ANTIBODY: SSA (Ro) (ENA) Antibody, IgG: <1 AI

## 2020-11-26 LAB — GLOMERULAR BASEMENT MEMBRANE ANTIBODIES: GBM Ab: <1 AI

## 2020-11-29 LAB — ANA+ENA+DNA/DS+SCL 70+SJOSSA/B
ANA Titer 1: NEGATIVE
ENA RNP Ab: 0.5 AI (ref 0.0–0.9)
ENA SM Ab Ser-aCnc: <0.2 AI (ref 0.0–0.9)
ENA SSA (RO) Ab: <0.2 AI (ref 0.0–0.9)
ENA SSB (LA) Ab: <0.2 AI (ref 0.0–0.9)
Scleroderma (Scl-70) (ENA) Antibody, IgG: <0.2 AI (ref 0.0–0.9)
dsDNA Ab: <1 IU/mL (ref 0–9)

## 2020-12-01 ENCOUNTER — Ambulatory Visit (INDEPENDENT_AMBULATORY_CARE_PROVIDER_SITE_OTHER): Payer: Medicare PPO

## 2020-12-01 DIAGNOSIS — I428 Other cardiomyopathies: Secondary | ICD-10-CM | POA: Diagnosis not present

## 2020-12-03 LAB — CUP PACEART REMOTE DEVICE CHECK
Battery Remaining Longevity: 23 mo
Battery Voltage: 2.9 V
Brady Statistic AP VP Percent: 70.91 %
Brady Statistic AP VS Percent: 0.1 %
Brady Statistic AS VP Percent: 28.58 %
Brady Statistic AS VS Percent: 0.4 %
Brady Statistic RA Percent Paced: 69.88 %
Brady Statistic RV Percent Paced: 98.11 %
Date Time Interrogation Session: 20220303012304
HighPow Impedance: 70 Ohm
Implantable Lead Implant Date: 20160607
Implantable Lead Implant Date: 20160607
Implantable Lead Implant Date: 20160607
Implantable Lead Location: 753858
Implantable Lead Location: 753859
Implantable Lead Location: 753860
Implantable Lead Model: 4598
Implantable Lead Model: 5076
Implantable Pulse Generator Implant Date: 20160607
Lead Channel Impedance Value: 1045 Ohm
Lead Channel Impedance Value: 1083 Ohm
Lead Channel Impedance Value: 1216 Ohm
Lead Channel Impedance Value: 1311 Ohm
Lead Channel Impedance Value: 380 Ohm
Lead Channel Impedance Value: 437 Ohm
Lead Channel Impedance Value: 456 Ohm
Lead Channel Impedance Value: 532 Ohm
Lead Channel Impedance Value: 627 Ohm
Lead Channel Impedance Value: 703 Ohm
Lead Channel Impedance Value: 722 Ohm
Lead Channel Impedance Value: 950 Ohm
Lead Channel Impedance Value: 969 Ohm
Lead Channel Pacing Threshold Amplitude: 0.5 V
Lead Channel Pacing Threshold Amplitude: 0.625 V
Lead Channel Pacing Threshold Amplitude: 1.5 V
Lead Channel Pacing Threshold Pulse Width: 0.4 ms
Lead Channel Pacing Threshold Pulse Width: 0.4 ms
Lead Channel Pacing Threshold Pulse Width: 0.4 ms
Lead Channel Sensing Intrinsic Amplitude: 1.75 mV
Lead Channel Sensing Intrinsic Amplitude: 1.75 mV
Lead Channel Sensing Intrinsic Amplitude: 14 mV
Lead Channel Sensing Intrinsic Amplitude: 14 mV
Lead Channel Setting Pacing Amplitude: 1.5 V
Lead Channel Setting Pacing Amplitude: 2 V
Lead Channel Setting Pacing Amplitude: 2 V
Lead Channel Setting Pacing Pulse Width: 0.4 ms
Lead Channel Setting Pacing Pulse Width: 0.4 ms
Lead Channel Setting Sensing Sensitivity: 0.3 mV

## 2020-12-07 ENCOUNTER — Ambulatory Visit (HOSPITAL_COMMUNITY)
Admission: RE | Admit: 2020-12-07 | Discharge: 2020-12-07 | Disposition: A | Discharge status: Home / Self Care | Payer: Medicare PPO | Source: Ambulatory Visit | Attending: Internal Medicine | Admitting: Internal Medicine

## 2020-12-07 ENCOUNTER — Other Ambulatory Visit: Payer: Self-pay

## 2020-12-07 DIAGNOSIS — I700 Atherosclerosis of aorta: Secondary | ICD-10-CM | POA: Diagnosis not present

## 2020-12-07 DIAGNOSIS — K449 Diaphragmatic hernia without obstruction or gangrene: Secondary | ICD-10-CM | POA: Diagnosis not present

## 2020-12-07 DIAGNOSIS — K8020 Calculus of gallbladder without cholecystitis without obstruction: Secondary | ICD-10-CM | POA: Insufficient documentation

## 2020-12-07 DIAGNOSIS — N400 Enlarged prostate without lower urinary tract symptoms: Secondary | ICD-10-CM | POA: Insufficient documentation

## 2020-12-07 DIAGNOSIS — R911 Solitary pulmonary nodule: Secondary | ICD-10-CM | POA: Diagnosis not present

## 2020-12-07 DIAGNOSIS — R918 Other nonspecific abnormal finding of lung field: Secondary | ICD-10-CM | POA: Diagnosis not present

## 2020-12-07 DIAGNOSIS — I2510 Atherosclerotic heart disease of native coronary artery without angina pectoris: Secondary | ICD-10-CM | POA: Diagnosis not present

## 2020-12-07 DIAGNOSIS — E041 Nontoxic single thyroid nodule: Secondary | ICD-10-CM | POA: Insufficient documentation

## 2020-12-07 LAB — GLUCOSE, CAPILLARY: Glucose-Capillary: 105 mg/dL — ABNORMAL HIGH (ref 70–99)

## 2020-12-07 MED ORDER — FLUDEOXYGLUCOSE F - 18 (FDG) INJECTION
11.3000 | Freq: Once | INTRAVENOUS | Status: AC
  Administered 2020-12-07: 11.4 via INTRAVENOUS

## 2020-12-08 LAB — MYOMARKER 3 PLUS PROFILE (RDL)

## 2020-12-12 ENCOUNTER — Ambulatory Visit (INDEPENDENT_AMBULATORY_CARE_PROVIDER_SITE_OTHER): Payer: Medicare PPO

## 2020-12-12 DIAGNOSIS — Z85828 Personal history of other malignant neoplasm of skin: Secondary | ICD-10-CM | POA: Diagnosis not present

## 2020-12-12 DIAGNOSIS — I5022 Chronic systolic (congestive) heart failure: Secondary | ICD-10-CM | POA: Diagnosis not present

## 2020-12-12 DIAGNOSIS — D044 Carcinoma in situ of skin of scalp and neck: Secondary | ICD-10-CM | POA: Diagnosis not present

## 2020-12-12 DIAGNOSIS — Z08 Encounter for follow-up examination after completed treatment for malignant neoplasm: Secondary | ICD-10-CM | POA: Diagnosis not present

## 2020-12-12 DIAGNOSIS — X32XXXD Exposure to sunlight, subsequent encounter: Secondary | ICD-10-CM | POA: Diagnosis not present

## 2020-12-12 DIAGNOSIS — B078 Other viral warts: Secondary | ICD-10-CM | POA: Diagnosis not present

## 2020-12-12 DIAGNOSIS — Z95810 Presence of automatic (implantable) cardiac defibrillator: Secondary | ICD-10-CM

## 2020-12-12 DIAGNOSIS — L570 Actinic keratosis: Secondary | ICD-10-CM | POA: Diagnosis not present

## 2020-12-12 NOTE — Progress Notes (Signed)
Remote ICD transmission.   

## 2020-12-13 ENCOUNTER — Other Ambulatory Visit: Payer: Self-pay | Admitting: Cardiology

## 2020-12-13 NOTE — Telephone Encounter (Signed)
Lipid clinic to refill;[facility] does not manage repatha

## 2020-12-14 NOTE — Progress Notes (Signed)
EPIC Encounter for ICM Monitoring  Patient Name: Chad Brown is a 80 y.o. male Date: 12/14/2020 Primary Care Physican: Ginger Organ., MD Primary [id number] Electrophysiologist: Allred Bi-V Pacing:97.8% 12/14/2020 Weight: 213-215lbs (baseline)  Time in AT/AF0.0 hr/day (0.0%)(taking Xarelto)   Spoke with patient and reports feeling well at this time.  Denies fluid symptoms.  He said waiting on return call from HF clinic regarding an appointment.  Last visit with Dr Aundra Dubin was 11/2019.  Optivol thoracic impedancenormal.  Prescribed:  Furosemide20 mg2tablets(40 mg total)twice a day.  Spironolactone 25 mgTake 0.5 tablet (12.5 mg total) by mouth daily. Take at bedtime  Labs: 10/27/2020 Creatinine 1.34, BUN 17, Potassium 3.3, Sodium 137, GFR 54 07/25/2020 Creatinine 1.61, BUN 19, Potassium 4.8, Sodium 140, GFR 43 04/29/2020 Creatinine 1.89, BUN 20, Potassium 4.1, Sodium 140, GFR 33-38 03/30/2020 Creatinine 1.83, BUN 19, Potassium 4.3, Sodium 144, GFR 34-40 03/16/2020 Creatinine 1.72, BUN 19, Potassium 5.3, Sodium 141, GFR 37-43 03/09/2020 Creatinine 1.58, BUN 19, Potassium 4.3, Sodium 137, GFR 41-48 Care Everywhere 01/25/2020 Creatinine 2.54, BUN 34, Potassium 5.0, Sodium 141, GFR 23-27 A complete set of results can be found in Results Review.  Recommendations: No changes and encouraged to call if experiencing any fluid symptoms.  Follow-up plan: ICM clinic phone appointment on 01/16/2021. 91 day device clinic remote transmission6/11/2020.  EP/Cardiology NextOfficeVisit:  Recall 04/10/2021 with Tommye Standard, PA.   Recall 1/7/2023with Dr Rayann Heman  Copy of ICM check sent to Dr.Allred.  3 month ICM trend: 12/12/2020.    1 Year ICM trend:       Rosalene Billings, RN 12/14/2020 8:48 AM

## 2020-12-21 ENCOUNTER — Other Ambulatory Visit: Payer: Self-pay | Admitting: Cardiology

## 2020-12-23 NOTE — Progress Notes (Signed)
Normal autoimmune and myositis panel

## 2020-12-23 NOTE — Progress Notes (Signed)
Low grade uptake on PET scan + other findings. Please get results when visiting Dr Valeta Harms on 12/28/20

## 2020-12-24 ENCOUNTER — Other Ambulatory Visit (HOSPITAL_COMMUNITY)
Admission: RE | Admit: 2020-12-24 | Discharge: 2020-12-24 | Disposition: A | Discharge status: Home / Self Care | Payer: Medicare PPO | Source: Ambulatory Visit | Attending: Internal Medicine | Admitting: Internal Medicine

## 2020-12-24 DIAGNOSIS — Z20822 Contact with and (suspected) exposure to covid-19: Secondary | ICD-10-CM | POA: Insufficient documentation

## 2020-12-24 DIAGNOSIS — Z01812 Encounter for preprocedural laboratory examination: Secondary | ICD-10-CM | POA: Diagnosis not present

## 2020-12-24 LAB — SARS CORONAVIRUS 2 (TAT 6-24 HRS): SARS Coronavirus 2: NEGATIVE

## 2020-12-28 ENCOUNTER — Other Ambulatory Visit: Payer: Self-pay

## 2020-12-28 ENCOUNTER — Ambulatory Visit: Payer: Medicare PPO | Admitting: Pulmonary Disease

## 2020-12-28 ENCOUNTER — Ambulatory Visit (INDEPENDENT_AMBULATORY_CARE_PROVIDER_SITE_OTHER): Payer: Medicare PPO | Admitting: Pulmonary Disease

## 2020-12-28 ENCOUNTER — Encounter: Payer: Self-pay | Admitting: Pulmonary Disease

## 2020-12-28 VITALS — BP 130/76 | HR 64 | Temp 97.4°F | Ht 65.0 in | Wt 218.0 lb

## 2020-12-28 DIAGNOSIS — Z85820 Personal history of malignant melanoma of skin: Secondary | ICD-10-CM

## 2020-12-28 DIAGNOSIS — E041 Nontoxic single thyroid nodule: Secondary | ICD-10-CM

## 2020-12-28 DIAGNOSIS — N289 Disorder of kidney and ureter, unspecified: Secondary | ICD-10-CM

## 2020-12-28 DIAGNOSIS — Z87891 Personal history of nicotine dependence: Secondary | ICD-10-CM

## 2020-12-28 DIAGNOSIS — R918 Other nonspecific abnormal finding of lung field: Secondary | ICD-10-CM

## 2020-12-28 DIAGNOSIS — R053 Chronic cough: Secondary | ICD-10-CM | POA: Diagnosis not present

## 2020-12-28 LAB — PULMONARY FUNCTION TEST
DL/VA % pred: 77 %
DL/VA: 3.09 ml/min/mmHg/L
DLCO cor % pred: 69 %
DLCO cor: 14.47 ml/min/mmHg
DLCO unc % pred: 69 %
DLCO unc: 14.47 ml/min/mmHg
FEF 25-75 Post: 2.06 L/sec
FEF 25-75 Pre: 1.43 L/sec
FEF2575-%Change-Post: 44 %
FEF2575-%Pred-Post: 135 %
FEF2575-%Pred-Pre: 93 %
FEV1-%Change-Post: 10 %
FEV1-%Pred-Post: 93 %
FEV1-%Pred-Pre: 85 %
FEV1-Post: 2.11 L
FEV1-Pre: 1.92 L
FEV1FVC-%Change-Post: 2 %
FEV1FVC-%Pred-Pre: 103 %
FEV6-%Change-Post: 7 %
FEV6-%Pred-Post: 93 %
FEV6-%Pred-Pre: 86 %
FEV6-Post: 2.78 L
FEV6-Pre: 2.58 L
FEV6FVC-%Pred-Post: 108 %
FEV6FVC-%Pred-Pre: 108 %
FVC-%Change-Post: 7 %
FVC-%Pred-Post: 86 %
FVC-%Pred-Pre: 80 %
FVC-Post: 2.78 L
FVC-Pre: 2.58 L
Post FEV1/FVC ratio: 76 %
Post FEV6/FVC ratio: 100 %
Pre FEV1/FVC ratio: 74 %
Pre FEV6/FVC Ratio: 100 %
RV % pred: 110 %
RV: 2.62 L
TLC % pred: 86 %
TLC: 5.21 L

## 2020-12-28 NOTE — Progress Notes (Signed)
PFT done today. 

## 2020-12-28 NOTE — Patient Instructions (Signed)
Thank you for visiting Dr. Valeta Harms at [facility]. Today we recommend the following:  Orders Placed This Encounter  Procedures  . CT Chest Wo Contrast  . Ambulatory referral to Endocrinology   Follow up with me after 6 months after CT chest is complete   Return in about 6 months (around 06/30/2021) for with APP or Dr. Valeta Harms.    Please do your part to reduce the spread of COVID-19.

## 2020-12-28 NOTE — Progress Notes (Signed)
Synopsis: Referred in March 2022 for multiple lung nodules by Ginger Organ., MD  Subjective:   PATIENT ID: Chad Brown GENDER: male DOB: 1941-05-08, MRN: [id number]  Chief Complaint  Patient presents with  . Follow-up    Here to discuss PFT results.  He stated that he gets [facility] going up inclines.stairs    PMH Former smoker, 40+ years, quit in 2001, CT scan with multiple pulmonary nodules evaluated by PET scan.  Patient has a past medical history of stage II melanoma of the lower leg status post resection in 1245, chronic systolic heart failure, AICD placement, history of hypertension hyperlipidemia.  Patient initially seen by Dr. Chase Caller for evaluation of multiple pulmonary nodules.  There was initial mentioning of potential metastatic disease with history of melanoma.  Patient underwent nuclear medicine pet imaging which revealed low-level uptake within the upper lobe nodules.  She also has a calcified lesion within the pancreas and a new thyroid nodule within the right thyroid gland was found that did have PET uptake.  From a respiratory standpoint the patient is breathing okay.  He had pulmonary function tests completed prior to today's office visit which revealed normal ratio, a normal FEV1, a TLC of 86%, DLCO of 69%.  There was no significant bronchodilator response.  Patient's PFTs were reviewed and interpreted today in the office with patient.      Past Medical History:  Diagnosis Date  . Adenomatous colon polyp    tubular  . AICD (automatic cardioverter/defibrillator) present 03/08/2015   MDT CRTD   . Anemia    iron deficient  . Arthritis    "about all my joints; hands, knees, back" (03/08/2015)  . Atherosclerosis   . Cataract    left eye small  . Cholelithiasis    gallstones  . Chronic systolic CHF (congestive heart failure) ([place])    a. New dx 12/2012 ? NICM, may be r/t afib. b. Nuc 03/2013 - normal;  c. 03/2015 TEE EF 15-20%.  . Colon polyp, hyperplastic 5/16    removed precancerous lesions  . Depression   . Diverticulosis   . Glaucoma    right eye  . Hyperlipidemia   . Hypertension   . Melanoma of eye ([place]) 2000's   "right; it's never been biopsied"  . Melanoma of lower leg ([place]) 2015   "left; right at my knee"  . Myocardial infarction ([place]) 1998  . OSA (obstructive sleep apnea) 01/04/2016   uses cpap, pt does not know settings auto set  . Peripheral vision loss, right 2006  . Persistent atrial fibrillation ([place])    a. Dx 12/2012, s/p TEE/DCCV 01/26/13. b. On Xarelto (CHA2DS2VASc = 3);  c. 03/2015 TEE (EF 15-20%, no LAA thrombus) and DCCV - amio increased to 200 mg bid.  . Urinary hesitancy due to benign prostatic hypertrophy      Family History  Problem Relation Age of Onset  . Kidney disease Mother   . Heart disease Mother        MI, open heart  . Diabetes Mother        dialysis  . Leukemia Father   . Colon cancer Paternal Uncle   . Lung cancer Paternal Uncle        x 2  . Prostate cancer Paternal Uncle   . Diabetes Maternal Grandmother   . Heart attack Maternal Uncle   . Diabetes Maternal Aunt        x 3  . Diabetes Maternal Uncle  Past Surgical History:  Procedure Laterality Date  . ATRIAL FIBRILLATION ABLATION N/A 11/03/2019   Procedure: ATRIAL FIBRILLATION ABLATION;  Surgeon: Thompson Grayer, MD;  Location: [facility];  Service: Cardiovascular;  Laterality: N/A;  . BACK SURGERY     upper back, cannot turn neck well  . CARDIAC CATHETERIZATION  1998  . CARDIOVERSION N/A 01/26/2013   Procedure: CARDIOVERSION;  Surgeon: Lelon Perla, MD;  Location: [facility] ENDOSCOPY;  Service: Cardiovascular;  Laterality: N/A;  . CARDIOVERSION N/A 03/23/2015   Procedure: CARDIOVERSION;  Surgeon: Jerline Pain, MD;  Location: [facility] ENDOSCOPY;  Service: Cardiovascular;  Laterality: N/A;  . CARDIOVERSION N/A 08/14/2017   Procedure: CARDIOVERSION;  Surgeon: Josue Hector, MD;  Location: [place];  Service: Cardiovascular;  Laterality:  N/A;  . CATARACT EXTRACTION Right ~ 2006  . COLONOSCOPY WITH PROPOFOL N/A 02/10/2015   Procedure: COLONOSCOPY WITH PROPOFOL;  Surgeon: Gatha Mayer, MD;  Location: [facility] ENDOSCOPY;  Service: Endoscopy;  Laterality: N/A;  . COLONOSCOPY WITH PROPOFOL N/A 08/07/2016   Procedure: COLONOSCOPY WITH PROPOFOL;  Surgeon: Gatha Mayer, MD;  Location: [facility] ENDOSCOPY;  Service: Endoscopy;  Laterality: N/A;  . ENTEROSCOPY N/A 08/17/2015   Procedure: ENTEROSCOPY;  Surgeon: Gatha Mayer, MD;  Location: [facility] ENDOSCOPY;  Service: Endoscopy;  Laterality: N/A;  . EP IMPLANTABLE DEVICE N/A 03/08/2015   MDT Hillery Aldo CRT-D for nonischemic CM by Dr Rayann Heman for primary prevention  . GLAUCOMA SURGERY Right ~ 2006   "put 3 stents in to drain fluid" (03/08/2015) not successful, sent to [facility] to try to get last stent out  . HOT HEMOSTASIS N/A 08/07/2016   Procedure: HOT HEMOSTASIS (ARGON PLASMA COAGULATION/BICAP);  Surgeon: Gatha Mayer, MD;  Location: [facility];  Service: Endoscopy;  Laterality: N/A;  . INCISION AND DRAINAGE ABSCESS POSTERIOR CERVICALSPINE  05/2012  . JOINT REPLACEMENT    . MELANOMA EXCISION Left 2015   "lower leg; right at my knee"  . REFRACTIVE SURGERY Right ~ 2006 X 2   "twice; both done at [place]" (03/08/2015  . SURGERY SCROTAL / TESTICULAR Right 1990's  . TEE WITHOUT CARDIOVERSION N/A 01/26/2013   Procedure: TRANSESOPHAGEAL ECHOCARDIOGRAM (TEE);  Surgeon: Lelon Perla, MD;  Location: [facility];  Service: Cardiovascular;  Laterality: N/A;  [name] anes. /   . TEE WITHOUT CARDIOVERSION N/A 10/05/2014   Procedure: TRANSESOPHAGEAL ECHOCARDIOGRAM (TEE)  with cardioversion;  Surgeon: Thayer Headings, MD;  Location[facility] ENDOSCOPY;  Service: Cardiovascular;  Laterality: N/A;  12:52 synched cardioversion at 120 joules,...afib to SR...12 lead EKG ordered.Marland KitchenMarland KitchenCardiozem d/c'ed per MD verbal order at SR  . TEE WITHOUT CARDIOVERSION N/A 03/23/2015   Procedure: TRANSESOPHAGEAL ECHOCARDIOGRAM (TEE);  Surgeon: Jerline Pain, MD;  Location: [place];  Service: Cardiovascular;  Laterality: N/A;  . TEE WITHOUT CARDIOVERSION N/A 11/02/2019   Procedure: TRANSESOPHAGEAL ECHOCARDIOGRAM (TEE);  Surgeon: Josue Hector, MD;  Location: [facility] ENDOSCOPY;  Service: Cardiovascular;  Laterality: N/A;  . THORACIC SPINE SURGERY  03/2000   "ground calcium deposits from upper thoracic" (01/26/2013)  . TOTAL HIP ARTHROPLASTY Right 06/2007    Social History   Socioeconomic History  . Marital status: Married    Spouse name: Not on file  . Number of children: 3  . Years of education: Not on file  . Highest education level: Not on file  Occupational History  . Occupation: retired  Tobacco Use  . Smoking status: Former Smoker    Packs/day: 3.00    Years: 48.00    Pack  years: 144.00    Types: Cigarettes    Quit date: 09/28/2000    Years since quitting: 20.2  . Smokeless tobacco: Never Used  Vaping Use  . Vaping Use: Never used  Substance and Sexual Activity  . Alcohol use: No  . Drug use: No  . Sexual activity: Yes  Other Topics Concern  . Not on file  Social History Narrative   Pt lives in [place] with spouse.  3 children are grown and healthy.   Retired.  Ran a country store for 30 years, previously worked in the [employer] for 16 years   Social Determinants of Radio broadcast assistant Strain: Not on [employer] Insecurity: Not on file  Transportation Needs: No Transportation Needs  . Lack of Transportation (Medical): No  . Lack of Transportation (Non-Medical): No  Physical Activity: Inactive  . Days of Exercise per Week: 0 days  . Minutes of Exercise per Session: 0 min  Stress: Not on file  Social Connections: Not on file  Intimate Partner Violence: Not At Risk  . Fear of Current or Ex-Partner: No  . Emotionally Abused: No  . Physically Abused: No  . Sexually Abused: No     Allergies  Allergen Reactions  . Pravastatin Sodium Other (See Comments)    Joint and muscle pain  .  Azithromycin Diarrhea     Outpatient Medications Prior to Visit  Medication Sig Dispense Refill  . calcitRIOL (ROCALTROL) 0.25 MCG capsule Take 0.25 mcg by mouth 3 (three) times a week.    . carvedilol (COREG) 12.5 MG tablet Take 12.5 mg by mouth 2 (two) times daily with a meal.    . dorzolamide-timolol (COSOPT) 22.3-6.8 MG/ML ophthalmic solution Place 1 drop into the right eye 2 (two) times daily.    [name] escitalopram (LEXAPRO) 20 MG tablet Take 1 tablet (20 mg total) by mouth daily. 90 tablet 0  . finasteride (PROSCAR) 5 MG tablet Take 1 tablet (5 mg total) by mouth daily. 30 tablet 2  . furosemide (LASIX) 40 MG tablet Take 40 mg by mouth in the morning and at bedtime.    . gabapentin (NEURONTIN) 100 MG capsule Take 200 mg by mouth 2 (two) times daily.     [name] latanoprost (XALATAN) 0.005 % ophthalmic solution Place 1 drop into the right eye at bedtime.     . Netarsudil Dimesylate 0.02 % SOLN Apply 1 drop to eye daily.    . nitroGLYCERIN (NITROSTAT) 0.4 MG SL tablet Place 0.4 mg under the tongue every 5 (five) minutes as needed for chest pain.     [name] Glycol-Propyl Glycol 0.4-0.3 % SOLN Apply 1 drop to eye in the morning and at bedtime.    [name] REPATHA SURECLICK 767 MG/ML SOAJ Inject 1 pen into the skin every 14 (fourteen) days. 2 mL 11  . Rivaroxaban (XARELTO) 15 MG TABS tablet Take 1 tablet (15 mg total) by mouth daily with supper. 30 tablet 11  . sacubitril-valsartan (ENTRESTO) 24-26 MG Take 1 tablet by mouth 2 (two) times daily. Needs appt 60 tablet 0  . spironolactone (ALDACTONE) 25 MG tablet Take 0.5 tablets (12.5 mg total) by mouth daily. Take at bedtime 45 tablet 3  . tamsulosin (FLOMAX) 0.4 MG CAPS Take 0.4 mg by mouth at bedtime.      No facility-administered medications prior to visit.    Review of Systems  Constitutional: Negative for chills, fever, malaise/fatigue and weight loss.  HENT: Negative for hearing loss, sore  throat and tinnitus.   Eyes: Negative for blurred vision  and double vision.  Respiratory: Negative for cough, hemoptysis, sputum production, shortness of breath, wheezing and stridor.   Cardiovascular: Negative for chest pain, palpitations, orthopnea, leg swelling and PND.  Gastrointestinal: Negative for abdominal pain, constipation, diarrhea, heartburn, nausea and vomiting.  Genitourinary: Negative for dysuria, hematuria and urgency.  Musculoskeletal: Negative for joint pain and myalgias.  Skin: Negative for itching and rash.  Neurological: Negative for dizziness, tingling, weakness and headaches.  Endo/Heme/Allergies: Negative for environmental allergies. Does not bruise/bleed easily.  Psychiatric/Behavioral: Negative for depression. The patient is not nervous/anxious and does not have insomnia.   All other systems reviewed and are negative.    Objective:  Physical Exam Vitals reviewed.  Constitutional:      General: He is not in acute distress.    Appearance: He is well-developed.  HENT:     Head: Normocephalic and atraumatic.  Eyes:     General: No scleral icterus.    Conjunctiva/sclera: Conjunctivae normal.     Pupils: Pupils are equal, round, and reactive to light.  Neck:     Vascular: No JVD.     Trachea: No tracheal deviation.  Cardiovascular:     Rate and Rhythm: Normal rate and regular rhythm.     Heart sounds: Normal heart sounds. No murmur heard.   Pulmonary:     Effort: Pulmonary effort is normal. No tachypnea, accessory muscle usage or respiratory distress.     Breath sounds: No stridor. No wheezing, rhonchi or rales.  Abdominal:     General: Bowel sounds are normal. There is no distension.     Palpations: Abdomen is soft.     Tenderness: There is no abdominal tenderness.  Musculoskeletal:        General: No tenderness.     Cervical back: Neck supple.  Lymphadenopathy:     Cervical: No cervical adenopathy.  Skin:    General: Skin is warm and dry.     Capillary Refill: Capillary refill takes less than 2 seconds.      Findings: No rash.  Neurological:     Mental Status: He is alert and oriented to person, place, and time.  Psychiatric:        Behavior: Behavior normal.      Vitals:   12/28/20 1200  BP: 130/76  Pulse: 64  Temp: (!) 97.4 F (36.3 C)  TempSrc: Tympanic  SpO2: 95%  Weight: 218 lb (98.9 kg)  Height: 5\' 5"  (1.651 m)   95% on RA BMI Readings from Last 3 Encounters:  12/28/20 36.28 kg/m  11/24/20 35.19 kg/m  11/03/20 35.39 kg/m   Wt Readings from Last 3 Encounters:  12/28/20 218 lb (98.9 kg)  11/24/20 218 lb (98.9 kg)  11/03/20 219 lb 4 oz (99.5 kg)     CBC    Component Value Date/Time   WBC 5.4 10/27/2020 1052   RBC 3.68 (L) 10/27/2020 1052   HGB 11.4 (L) 10/27/2020 1052   HCT 35.7 (L) 10/27/2020 1052   PLT 141 (L) 10/27/2020 1052   MCV 97.0 10/27/2020 1052   MCH 31.0 10/27/2020 1052   MCHC 31.9 10/27/2020 1052   RDW 14.7 10/27/2020 1052   LYMPHSABS 1.0 10/27/2020 1052   MONOABS 0.3 10/27/2020 1052   EOSABS 0.2 10/27/2020 1052   BASOSABS 0.1 10/27/2020 1052    Chest Imaging: 12/07/2020 nuclear medicine PET scan: Patient has ill-defined right upper lobe nodular opacities largest at 1.2 cm, SUV max 1.8 similar  to imaging which it measured 1.4 cm in January 2022.  Has a area of soft tissue thickening with an SUV max of 3.3.  These have not overtly grown in size in comparison to previous CT imaging in January.  No other concerning evidence of metastasis also has a right-sided thyroid nodule. The patient's images have been independently reviewed by me.    Pulmonary Functions Testing Results: PFT Results Latest Ref Rng & Units 12/28/2020 11/18/2018  FVC-Pre L 2.58 2.62  FVC-Predicted Pre % 80 78  FVC-Post L 2.78 2.89  FVC-Predicted Post % 86 86  Pre FEV1/FVC % % 74 71  Post FEV1/FCV % % 76 76  FEV1-Pre L 1.92 1.86  FEV1-Predicted Pre % 85 78  FEV1-Post L 2.11 2.19  DLCO uncorrected ml/min/mmHg 14.47 16.65  DLCO UNC% % 69 78  DLCO corrected ml/min/mmHg  14.47 18.73  DLCO COR %Predicted % 69 88  DLVA Predicted % 77 96  TLC L 5.21 6.02  TLC % Predicted % 86 99  RV % Predicted % 110 139    FeNO:   Pathology:   Echocardiogram:   Heart Catheterization:     Assessment & Plan:     ICD-10-CM   1. Lung nodules  R91.8 CT Chest Wo Contrast  2. Thyroid nodule  E04.1 Ambulatory referral to Endocrinology  3. Renal lesion  N28.9   4. Former smoker  Z87.891   5. History of melanoma  Z85.820     Discussion:  80 year old male, former smoker multiple pulmonary nodules.  Small lesions within the right upper lobe are likely inflammatory in nature.  No other distant metastatic disease on nuclear medicine pet imaging we reviewed this images today in the office as well as his pulmonary function tests were reviewed and interpreted.  Plan: Follow-up with primary care regarding thyroid nodule and nephrology regarding 8 mm left renal lesion. This was discussed with the patient. As for his lung nodules I think he needs at least a 78-month noncontrasted CT scan follow-up to ensure no progression or change.  This due to his smoking history as well as history of melanoma.  Patient is agreeable to this plan.    Current Outpatient Medications:  .  calcitRIOL (ROCALTROL) 0.25 MCG capsule, Take 0.25 mcg by mouth 3 (three) times a week., Disp: , Rfl:  .  carvedilol (COREG) 12.5 MG tablet, Take 12.5 mg by mouth 2 (two) times daily with a meal., Disp: , Rfl:  .  dorzolamide-timolol (COSOPT) 22.3-6.8 MG/ML ophthalmic solution, Place 1 drop into the right eye 2 (two) times daily., Disp: , Rfl:  .  escitalopram (LEXAPRO) 20 MG tablet, Take 1 tablet (20 mg total) by mouth daily., Disp: 90 tablet, Rfl: 0 .  finasteride (PROSCAR) 5 MG tablet, Take 1 tablet (5 mg total) by mouth daily., Disp: 30 tablet, Rfl: 2 .  furosemide (LASIX) 40 MG tablet, Take 40 mg by mouth in the morning and at bedtime., Disp: , Rfl:  .  gabapentin (NEURONTIN) 100 MG capsule, Take 200 mg by  mouth 2 (two) times daily. , Disp: , Rfl:  .  latanoprost (XALATAN) 0.005 % ophthalmic solution, Place 1 drop into the right eye at bedtime. , Disp: , Rfl:  .  Netarsudil Dimesylate 0.02 % SOLN, Apply 1 drop to eye daily., Disp: , Rfl:  .  nitroGLYCERIN (NITROSTAT) 0.4 MG SL tablet, Place 0.4 mg under the tongue every 5 (five) minutes as needed for chest pain. , Disp: , Rfl:  .  Polyethyl Glycol-Propyl Glycol 0.4-0.3 % SOLN, Apply 1 drop to eye in the morning and at bedtime., Disp: , Rfl:  .  REPATHA SURECLICK 761 MG/ML SOAJ, Inject 1 pen into the skin every 14 (fourteen) days., Disp: 2 mL, Rfl: 11 .  Rivaroxaban (XARELTO) 15 MG TABS tablet, Take 1 tablet (15 mg total) by mouth daily with supper., Disp: 30 tablet, Rfl: 11 .  sacubitril-valsartan (ENTRESTO) 24-26 MG, Take 1 tablet by mouth 2 (two) times daily. Needs appt, Disp: 60 tablet, Rfl: 0 .  spironolactone (ALDACTONE) 25 MG tablet, Take 0.5 tablets (12.5 mg total) by mouth daily. Take at bedtime, Disp: 45 tablet, Rfl: 3 .  tamsulosin (FLOMAX) 0.4 MG CAPS, Take 0.4 mg by mouth at bedtime. , Disp: , Rfl:   I spent 40 minutes dedicated to the care of this patient on the date of this encounter to include pre-visit review of records, face-to-face time with the patient discussing conditions above, post visit ordering of testing, clinical documentation with the electronic health record, making appropriate referrals as documented, and communicating necessary findings to members of the patients care team.   Garner Nash, DO [facility] 12/28/2020 12:12 PM

## 2021-01-05 ENCOUNTER — Other Ambulatory Visit: Payer: Self-pay | Admitting: Internal Medicine

## 2021-01-05 DIAGNOSIS — E041 Nontoxic single thyroid nodule: Secondary | ICD-10-CM

## 2021-01-16 ENCOUNTER — Ambulatory Visit (INDEPENDENT_AMBULATORY_CARE_PROVIDER_SITE_OTHER): Payer: Medicare PPO

## 2021-01-16 DIAGNOSIS — I5022 Chronic systolic (congestive) heart failure: Secondary | ICD-10-CM | POA: Diagnosis not present

## 2021-01-16 DIAGNOSIS — Z95810 Presence of automatic (implantable) cardiac defibrillator: Secondary | ICD-10-CM

## 2021-01-18 NOTE — Progress Notes (Signed)
EPIC Encounter for ICM Monitoring  Patient Name: Chad Brown is a 80 y.o. male Date: 01/18/2021 Primary Care Physican: Ginger Organ., MD Primary [id number] Electrophysiologist: Allred Bi-V Pacing:96.3% 12/14/2020 Weight: 213-215lbs (baseline)  Time in AT/AF0.0 hr/day (0.0%)(taking Xarelto)   Spoke with patient and reports feeling well at this time.  Denies fluid symptoms.   Last visit with Dr Aundra Dubin was 11/2019.  Optivol thoracic impedancenormal.  Prescribed:  Furosemide40 mg1tablet(40 mg total)by mouth in the morning and at bedtime  Spironolactone 25 mgTake 0.5 tablet (12.5 mg total) by mouth daily. Take at bedtime  Labs: 10/27/2020 Creatinine 1.34, BUN 17, Potassium 3.3, Sodium 137, GFR 54 A complete set of results can be found in Results Review.  Recommendations: No changes and encouraged to call if experiencing any fluid symptoms.  Follow-up plan: ICM clinic phone appointment on 03/03/2021. 91 day device clinic remote transmission6/11/2020.  EP/Cardiology NextOfficeVisit:  Recall 04/10/2021 with Tommye Standard, PA.   Recall1/7/2023with Dr Rayann Heman  Copy of ICM check sent to Dr.Allred.  3 month ICM trend: 01/16/2021.    1 Year ICM trend:       Rosalene Billings, RN 01/18/2021 9:17 AM

## 2021-01-23 DIAGNOSIS — Z08 Encounter for follow-up examination after completed treatment for malignant neoplasm: Secondary | ICD-10-CM | POA: Diagnosis not present

## 2021-01-23 DIAGNOSIS — Z85828 Personal history of other malignant neoplasm of skin: Secondary | ICD-10-CM | POA: Diagnosis not present

## 2021-01-23 DIAGNOSIS — D638 Anemia in other chronic diseases classified elsewhere: Secondary | ICD-10-CM | POA: Diagnosis not present

## 2021-01-23 DIAGNOSIS — D696 Thrombocytopenia, unspecified: Secondary | ICD-10-CM | POA: Diagnosis not present

## 2021-01-23 DIAGNOSIS — N1831 Chronic kidney disease, stage 3a: Secondary | ICD-10-CM | POA: Diagnosis not present

## 2021-01-23 DIAGNOSIS — I5042 Chronic combined systolic (congestive) and diastolic (congestive) heart failure: Secondary | ICD-10-CM | POA: Diagnosis not present

## 2021-01-23 DIAGNOSIS — X32XXXD Exposure to sunlight, subsequent encounter: Secondary | ICD-10-CM | POA: Diagnosis not present

## 2021-01-23 DIAGNOSIS — E211 Secondary hyperparathyroidism, not elsewhere classified: Secondary | ICD-10-CM | POA: Diagnosis not present

## 2021-01-23 DIAGNOSIS — L570 Actinic keratosis: Secondary | ICD-10-CM | POA: Diagnosis not present

## 2021-01-25 DIAGNOSIS — I5042 Chronic combined systolic (congestive) and diastolic (congestive) heart failure: Secondary | ICD-10-CM | POA: Diagnosis not present

## 2021-01-25 DIAGNOSIS — D638 Anemia in other chronic diseases classified elsewhere: Secondary | ICD-10-CM | POA: Diagnosis not present

## 2021-01-25 DIAGNOSIS — E211 Secondary hyperparathyroidism, not elsewhere classified: Secondary | ICD-10-CM | POA: Diagnosis not present

## 2021-01-25 DIAGNOSIS — N1831 Chronic kidney disease, stage 3a: Secondary | ICD-10-CM | POA: Diagnosis not present

## 2021-01-27 ENCOUNTER — Ambulatory Visit
Admission: RE | Admit: 2021-01-27 | Discharge: 2021-01-27 | Disposition: A | Discharge status: Home / Self Care | Payer: Medicare PPO | Source: Ambulatory Visit | Attending: Internal Medicine | Admitting: Internal Medicine

## 2021-01-27 DIAGNOSIS — E041 Nontoxic single thyroid nodule: Secondary | ICD-10-CM | POA: Diagnosis not present

## 2021-02-03 ENCOUNTER — Other Ambulatory Visit: Payer: Self-pay | Admitting: Internal Medicine

## 2021-02-03 ENCOUNTER — Other Ambulatory Visit: Payer: Self-pay | Admitting: Cardiovascular Disease

## 2021-02-03 DIAGNOSIS — E041 Nontoxic single thyroid nodule: Secondary | ICD-10-CM

## 2021-02-07 ENCOUNTER — Other Ambulatory Visit: Payer: Self-pay

## 2021-02-07 ENCOUNTER — Ambulatory Visit (HOSPITAL_COMMUNITY)
Admission: RE | Admit: 2021-02-07 | Discharge: 2021-02-07 | Disposition: A | Discharge status: Home / Self Care | Payer: Medicare PPO | Source: Ambulatory Visit | Attending: Cardiology | Admitting: Cardiology

## 2021-02-07 ENCOUNTER — Encounter (HOSPITAL_COMMUNITY): Payer: Self-pay | Admitting: Cardiology

## 2021-02-07 ENCOUNTER — Other Ambulatory Visit (HOSPITAL_COMMUNITY): Payer: Self-pay

## 2021-02-07 VITALS — BP 112/70 | HR 63 | Wt 217.0 lb

## 2021-02-07 DIAGNOSIS — G4733 Obstructive sleep apnea (adult) (pediatric): Secondary | ICD-10-CM | POA: Diagnosis not present

## 2021-02-07 DIAGNOSIS — Z8249 Family history of ischemic heart disease and other diseases of the circulatory system: Secondary | ICD-10-CM | POA: Insufficient documentation

## 2021-02-07 DIAGNOSIS — E782 Mixed hyperlipidemia: Secondary | ICD-10-CM

## 2021-02-07 DIAGNOSIS — E785 Hyperlipidemia, unspecified: Secondary | ICD-10-CM | POA: Diagnosis not present

## 2021-02-07 DIAGNOSIS — I5022 Chronic systolic (congestive) heart failure: Secondary | ICD-10-CM | POA: Diagnosis not present

## 2021-02-07 DIAGNOSIS — I480 Paroxysmal atrial fibrillation: Secondary | ICD-10-CM | POA: Insufficient documentation

## 2021-02-07 DIAGNOSIS — Z79899 Other long term (current) drug therapy: Secondary | ICD-10-CM | POA: Diagnosis not present

## 2021-02-07 DIAGNOSIS — Z87891 Personal history of nicotine dependence: Secondary | ICD-10-CM | POA: Insufficient documentation

## 2021-02-07 DIAGNOSIS — N183 Chronic kidney disease, stage 3 unspecified: Secondary | ICD-10-CM | POA: Insufficient documentation

## 2021-02-07 DIAGNOSIS — Z7901 Long term (current) use of anticoagulants: Secondary | ICD-10-CM | POA: Diagnosis not present

## 2021-02-07 LAB — LIPID PANEL
Cholesterol: 106 mg/dL (ref 0–200)
HDL: 23 mg/dL — ABNORMAL LOW (ref >40)
LDL Cholesterol: 42 mg/dL (ref 0–99)
Total CHOL/HDL Ratio: 4.6 RATIO
Triglycerides: 207 mg/dL — ABNORMAL HIGH (ref <150)
VLDL: 41 mg/dL — ABNORMAL HIGH (ref 0–40)

## 2021-02-07 LAB — BASIC METABOLIC PANEL
Anion gap: 5 (ref 5–15)
BUN: 14 mg/dL (ref 8–23)
CO2: 31 mmol/L (ref 22–32)
Calcium: 8.5 mg/dL — ABNORMAL LOW (ref 8.9–10.3)
Chloride: 105 mmol/L (ref 98–111)
Creatinine, Ser: 1.43 mg/dL — ABNORMAL HIGH (ref 0.61–1.24)
GFR, Estimated: 50 mL/min — ABNORMAL LOW (ref >60)
Glucose, Bld: 106 mg/dL — ABNORMAL HIGH (ref 70–99)
Potassium: 3.8 mmol/L (ref 3.5–5.1)
Sodium: 141 mmol/L (ref 135–145)

## 2021-02-07 MED ORDER — SPIRONOLACTONE 25 MG PO TABS: 25.0000 mg | ORAL_TABLET | Freq: Every day | ORAL | 3 refills | Status: DC

## 2021-02-07 MED ORDER — FUROSEMIDE 40 MG PO TABS: ORAL_TABLET | ORAL | 3 refills | Status: DC

## 2021-02-07 MED ORDER — EMPAGLIFLOZIN 10 MG PO TABS: 10.0000 mg | ORAL_TABLET | Freq: Every day | ORAL | 3 refills | Status: DC

## 2021-02-07 NOTE — Progress Notes (Signed)
Patient ID: Chad Brown, male   DOB: [birth year], 80 y.o.   MRN: [id number] PCP: Edrick Oh HF Cardiologist: Aundra Dubin  80 y.o. with paroxysmal atrial fibrillation and chronic systolic CHF thought to be due to nonischemic cardiomyopathy presents for followup of CHF. He has a cardiac history dating back to 1998, when he was admitted with chest pain and had cardiac cath showing nonobstructive CAD. In 4/14, he was found to be in atrial fibrillation.  Echo showed EF 30-35%.  He had DCCV back to NSR.  He was back in atrial fibrillation in 1/16, and EF was low again on TEE at that time.   Again, he had DCCV.  In 5/16, cardiac MRI showed persistently low EF, so given LBBB, he had Medtronic CRT-D device placed.  He was back in atrial fibrillation in 6/16 and had TEE-guided DCCV again with EF now 15-20% on TEE.  Repeat echo in 2/17 showed EF 55-60%. Echo in 1/19 showed EF 45-50%, mild LV and RV dilation.   Echo in 1/20 showed EF 45-50%, diffuse hypokinesis.   Atrial fibrillation ablation was done in 2/21.  Echo in 2/21 showed EF 40-45%, PASP 50 mmHg, mild MR.   Weight is stable.  He is short of breath walking up an incline.  No dyspnea walking on flat ground.  No orthopnea/PND.  No chest pain.  SBP has been up to 140s when he checks at home.  No lightheadedness.    ECG (personally reviewed): atrial/BiV pacing  Medtronic device interrogation: fluid index < threshold, no AF, no VT, 99% BiV pacing  Labs (6/16): LDL 153 Labs (7/16): HCT 40, LFTs normal, LFTs normal Labs (9/16): TSH normal, K 4.3, creatinine 1.03, BNP 419 Labs (10/17): K 4.2, creatinine 1.29, LFTs normal Labs (11/17): hgb 8.9 Labs (6/19): BNP 625 Labs (10/19): K 4.7, creatinine 1.66 => 1.58, LFTs normal Labs (1/20): LDL 128 Labs (11/20): K 3.6, creatinine 1.62 Labs (1/21): K 3.8, creatinine 1.89, BNP 578, LDL 115, TSH normal, LFTs normal Labs (3/21): K 4.7, creatinine 2.12 Labs (1/22): K 3.3, creatinine 1.34  PMH: 1. Atrial fibrillation:  Paroxysmal.  Diagnosed 4/14, DCCV 4/14 to NSR.  DCCV 1/16 to NSR.  DCCV 6/16 to NSR.  2. Chronic systolic CHF: Nonischemic cardiomyopathy.  LHC in 1998 with nonobstructive disease.  Cardiolite in 6/14 with no ischemia or infarction.  cMRI (5/16) with EF 34%, mildly dilated LV with diffuse HK worse in anterolateral wall, small punctate areas of LGE in anteroseptum and basal inferior wall (not CAD pattern).  TEE (6/16) with EF 15-20%.  He has Medtronic CRT-D device.  Echo (9/16) with EF 40%, moderate LV dilation, grade II diastolic dysfunction, normal RV size and systolic function, PASP 42 mmHg.  - Hypotension with Entresto.  - Echo (2/17) showed LV functional recovery, EF 55-60% with mild MR.    - Echo (1/19): EF 45-50%, mild LV dilation, mild RV dilation - Echo (1/20): EF 45-50%, diffuse hypokinesis, moderate diastolic dysfunction, normal RV size and systolic function.  - Echo (2/21): EF 40-45%, PASP 50 mmHg, mild MR.  3. Hyperlipidemia: Myalgias with atorvastatin, Livalo, and pravastatin.  4. CKD stage 3 5. OA: s/p THR.  6. H/o melanoma 7. Anemia 8. Diverticulosis 9. OSA: Uses CPAP.  10. Pulmonary nodules: Followed by Dr. Delton Coombes.  11. Pancreatic pseudocysts 12. PAD: ABIs (2/20) were 0.94 on right and 1.03 on left.   SH: Married with 3 children, lives in [place], retired, quit smoking in 2001.    FH: Mother with  MI  ROS: All systems reviewed and negative except as per HPI.  Current Outpatient Medications  Medication Sig Dispense Refill  . calcitRIOL (ROCALTROL) 0.25 MCG capsule Take 0.25 mcg by mouth 3 (three) times a week.    . carvedilol (COREG) 12.5 MG tablet Take 12.5 mg by mouth 2 (two) times daily with a meal.    . dorzolamide-timolol (COSOPT) 22.3-6.8 MG/ML ophthalmic solution Place 1 drop into the right eye 2 (two) times daily.    . empagliflozin (JARDIANCE) 10 MG TABS tablet Take 1 tablet (10 mg total) by mouth daily before breakfast. 90 tablet 3  . escitalopram  (LEXAPRO) 20 MG tablet Take 1 tablet (20 mg total) by mouth daily. 90 tablet 0  . finasteride (PROSCAR) 5 MG tablet Take 1 tablet (5 mg total) by mouth daily. 30 tablet 2  . gabapentin (NEURONTIN) 100 MG capsule Take 200 mg by mouth 2 (two) times daily.     [name] latanoprost (XALATAN) 0.005 % ophthalmic solution Place 1 drop into the right eye at bedtime.     . Netarsudil Dimesylate 0.02 % SOLN Apply 1 drop to eye daily.    . nitroGLYCERIN (NITROSTAT) 0.4 MG SL tablet Place 0.4 mg under the tongue every 5 (five) minutes as needed for chest pain.     [name] Glycol-Propyl Glycol 0.4-0.3 % SOLN Apply 1 drop to eye in the morning and at bedtime.    [name] REPATHA SURECLICK 465 MG/ML SOAJ Inject 1 pen into the skin every 14 (fourteen) days. 2 mL 11  . Rivaroxaban (XARELTO) 15 MG TABS tablet Take 1 tablet (15 mg total) by mouth daily with supper. 30 tablet 11  . sacubitril-valsartan (ENTRESTO) 24-26 MG Take 1 tablet by mouth 2 (two) times daily. Needs appt 60 tablet 0  . tamsulosin (FLOMAX) 0.4 MG CAPS Take 0.4 mg by mouth at bedtime.     . furosemide (LASIX) 40 MG tablet Take 1 tablet (40 mg total) by mouth every morning AND 0.5 tablets (20 mg total) every evening. 135 tablet 3  . spironolactone (ALDACTONE) 25 MG tablet Take 1 tablet (25 mg total) by mouth daily. Take at bedtime 90 tablet 3   No current facility-administered medications for this encounter.   BP 112/70   Pulse 63   Wt 98.4 kg (217 lb)   SpO2 96%   BMI 36.11 kg/m  General: NAD Neck: No JVD, no thyromegaly or thyroid nodule.  Lungs: Clear to auscultation bilaterally with normal respiratory effort. CV: Nondisplaced PMI.  Heart regular S1/S2, no S3/S4, no murmur.  No peripheral edema.  No carotid bruit.  Normal pedal pulses.  Abdomen: Soft, nontender, no hepatosplenomegaly, no distention.  Skin: Intact without lesions or rashes.  Neurologic: Alert and oriented x 3.  Psych: Normal affect. Extremities: No clubbing or cyanosis.  HEENT:  Normal.   Assessment/Plan: 1. Chronic systolic CHF: Suspect nonischemic cardiomyopathy.  EF 15-20% on 6/16 TEE.  Cardiac MRI in 5/16 showed a LGE pattern that was not suggestive of coronary disease (?myocarditis or infiltrative disease).  There may be a component of tachycardia-mediated cardiomyopathy.  However, the cardiac MRI appears to have been done when he was in NSR.  In 9/16, echo showed EF increased to 40%.  Echo in 1/20 showed EF 45-50%.  He has Medtronic CRT-D device.  Echo (2/21) with EF 40-45%.  He remains in NSR.  NYHA class II symptoms.  Not volume overloaded by exam or Optivol. - Add Farxiga 10 mg daily with low EF  and CKD, decrease Lasix to 40 qam/20 qpm.  - Increase spironolactone to 25 mg daily.  BMET today and 10 days.    - Continue Entresto 24/26 bid.  - Continue current Coreg.   - I will arrange for repeat echo.  2. Atrial fibrillation: Poorly tolerated.  Now s/p atrial fibrillation ablation in 2/21 and off amiodarone.  No recent atrial fibrillation by device check.  NSR today.  - Continue Xarelto 15 mg daily.    3. Hyperlipidemia: Continue Repatha, check lipids today.  4. OSA: Continue CPAP.   5. PAD: Minimal claudication.     Followup in 3 months.    Loralie Champagne 02/07/2021

## 2021-02-07 NOTE — Patient Instructions (Addendum)
EKG done today.  Labs done today. We will contact you only if your labs are abnormal.  START Jardiance 10mg  (1 tablet) by mouth daily.   DECREASE Lasix to 40mg  (1 tablet) by mouth every morning and 20mg  (1/2 tablet) by mouth every evening.  No other medication changes were made. Please continue all current medications as prescribed.  Your physician recommends that you schedule a follow-up appointment soon for an echo, 10 days for a lab only appointment and in 3 months with Dr. Aundra Dubin.  Your physician has requested that you have an echocardiogram. Echocardiography is a painless test that uses sound waves to create images of your heart. It provides your doctor with information about the size and shape of your heart and how well your heart's chambers and valves are working. This procedure takes approximately one hour. There are no restrictions for this procedure.  If you have any questions or concerns before your next appointment please send [place] a message through [place] or call our office at [phone].    TO LEAVE A MESSAGE FOR THE NURSE SELECT OPTION 2, PLEASE LEAVE A MESSAGE INCLUDING: . YOUR NAME . DATE OF BIRTH . CALL BACK NUMBER . REASON FOR CALL**this is important as we prioritize the call backs  YOU WILL RECEIVE A CALL BACK THE SAME DAY AS LONG AS YOU CALL BEFORE 4:00 PM   Do the following things EVERYDAY: 1) Weigh yourself in the morning before breakfast. Write it down and keep it in a log. 2) Take your medicines as prescribed 3) Eat low salt foods--Limit salt (sodium) to 2000 mg per day.  4) Stay as active as you can everyday 5) Limit all fluids for the day to less than 2 liters   At the [facility], you and your health needs are our priority. As part of our continuing mission to provide you with exceptional heart care, we have created designated Provider Care Teams. These Care Teams include your primary Cardiologist (physician) and Advanced Practice  Providers (APPs- Physician Assistants and Nurse Practitioners) who all work together to provide you with the care you need, when you need it.   You may see any of the following providers on your designated Care Team at your next follow up: [name] Dr Glori Bickers . Dr Loralie Champagne . Darrick Grinder, NP . Lyda Jester, PA . Audry Riles, PharmD   Please be sure to bring in all your medications bottles to every appointment.

## 2021-02-08 ENCOUNTER — Ambulatory Visit
Admission: RE | Admit: 2021-02-08 | Discharge: 2021-02-08 | Disposition: A | Discharge status: Home / Self Care | Payer: Medicare PPO | Source: Ambulatory Visit | Attending: Internal Medicine | Admitting: Internal Medicine

## 2021-02-08 ENCOUNTER — Other Ambulatory Visit (HOSPITAL_COMMUNITY)
Admission: RE | Admit: 2021-02-08 | Discharge: 2021-02-08 | Disposition: A | Discharge status: Home / Self Care | Payer: Medicare PPO | Source: Ambulatory Visit | Attending: Internal Medicine | Admitting: Internal Medicine

## 2021-02-08 DIAGNOSIS — D497 Neoplasm of unspecified behavior of endocrine glands and other parts of nervous system: Secondary | ICD-10-CM | POA: Diagnosis not present

## 2021-02-08 DIAGNOSIS — E041 Nontoxic single thyroid nodule: Secondary | ICD-10-CM

## 2021-02-09 LAB — CYTOLOGY - NON PAP

## 2021-02-11 ENCOUNTER — Other Ambulatory Visit: Payer: Self-pay | Admitting: Internal Medicine

## 2021-02-15 ENCOUNTER — Other Ambulatory Visit: Payer: Self-pay

## 2021-02-15 ENCOUNTER — Ambulatory Visit: Payer: Medicare PPO | Admitting: Endocrinology

## 2021-02-15 ENCOUNTER — Encounter: Payer: Self-pay | Admitting: Endocrinology

## 2021-02-15 DIAGNOSIS — R911 Solitary pulmonary nodule: Secondary | ICD-10-CM | POA: Insufficient documentation

## 2021-02-15 DIAGNOSIS — E041 Nontoxic single thyroid nodule: Secondary | ICD-10-CM | POA: Diagnosis not present

## 2021-02-15 DIAGNOSIS — R918 Other nonspecific abnormal finding of lung field: Secondary | ICD-10-CM

## 2021-02-15 NOTE — Patient Instructions (Signed)
Let's check the nuclear medicine scan.  Also, please see a surgery specialist.  you will receive a phone call, about days and times for these appointments.   If you cannot safely undergo the surgery, and the lung nodules are from the thyroid, a second best option is to take a Radioactive iodine treatment pill (different from the test pill above), to try to destroy the lung nodules.

## 2021-02-15 NOTE — Progress Notes (Signed)
Subjective:    Patient ID: Chad Brown, male    DOB: April 20, 1941, 80 y.o.   MRN: [id number]  HPI Pt is referred by Dr Brigitte Pulse, for nodular thyroid.  Pt was noted to have a thyroid nodule in 2022.  He is unaware of ever having had thyroid problems in the past.  He has no h/o XRT or surgery to the neck.  He does not notice the nodule, but he has intermitt hoarseness.  He has never had bx of any pulm nodules.   Past Medical History:  Diagnosis Date  . Adenomatous colon polyp    tubular  . AICD (automatic cardioverter/defibrillator) present 03/08/2015   MDT CRTD   . Anemia    iron deficient  . Arthritis    "about all my joints; hands, knees, back" (03/08/2015)  . Atherosclerosis   . Cataract    left eye small  . Cholelithiasis    gallstones  . Chronic systolic CHF (congestive heart failure) ([place])    a. New dx 12/2012 ? NICM, may be r/t afib. b. Nuc 03/2013 - normal;  c. 03/2015 TEE EF 15-20%.  . Colon polyp, hyperplastic 5/16   removed precancerous lesions  . Depression   . Diverticulosis   . Glaucoma    right eye  . Hyperlipidemia   . Hypertension   . Melanoma of eye ([place]) 2000's   "right; it's never been biopsied"  . Melanoma of lower leg ([place]) 2015   "left; right at my knee"  . Myocardial infarction ([place]) 1998  . OSA (obstructive sleep apnea) 01/04/2016   uses cpap, pt does not know settings auto set  . Peripheral vision loss, right 2006  . Persistent atrial fibrillation ([place])    a. Dx 12/2012, s/p TEE/DCCV 01/26/13. b. On Xarelto (CHA2DS2VASc = 3);  c. 03/2015 TEE (EF 15-20%, no LAA thrombus) and DCCV - amio increased to 200 mg bid.  . Urinary hesitancy due to benign prostatic hypertrophy     Past Surgical History:  Procedure Laterality Date  . ATRIAL FIBRILLATION ABLATION N/A 11/03/2019   Procedure: ATRIAL FIBRILLATION ABLATION;  Surgeon: Thompson Grayer, MD;  Location: [facility];  Service: Cardiovascular;  Laterality: N/A;  . BACK SURGERY     upper back, cannot turn  neck well  . CARDIAC CATHETERIZATION  1998  . CARDIOVERSION N/A 01/26/2013   Procedure: CARDIOVERSION;  Surgeon: Lelon Perla, MD;  Location: [facility] ENDOSCOPY;  Service: Cardiovascular;  Laterality: N/A;  . CARDIOVERSION N/A 03/23/2015   Procedure: CARDIOVERSION;  Surgeon: Jerline Pain, MD;  Location: [facility] ENDOSCOPY;  Service: Cardiovascular;  Laterality: N/A;  . CARDIOVERSION N/A 08/14/2017   Procedure: CARDIOVERSION;  Surgeon: Josue Hector, MD;  Location: [facility];  Service: Cardiovascular;  Laterality: N/A;  . CATARACT EXTRACTION Right ~ 2006  . COLONOSCOPY WITH PROPOFOL N/A 02/10/2015   Procedure: COLONOSCOPY WITH PROPOFOL;  Surgeon: Gatha Mayer, MD;  Location: [facility] ENDOSCOPY;  Service: Endoscopy;  Laterality: N/A;  . COLONOSCOPY WITH PROPOFOL N/A 08/07/2016   Procedure: COLONOSCOPY WITH PROPOFOL;  Surgeon: Gatha Mayer, MD;  Location: [facility] ENDOSCOPY;  Service: Endoscopy;  Laterality: N/A;  . ENTEROSCOPY N/A 08/17/2015   Procedure: ENTEROSCOPY;  Surgeon: Gatha Mayer, MD;  Location: [facility] ENDOSCOPY;  Service: Endoscopy;  Laterality: N/A;  . EP IMPLANTABLE DEVICE N/A 03/08/2015   MDT Hillery Aldo CRT-D for nonischemic CM by Dr Rayann Heman for primary prevention  . GLAUCOMA SURGERY Right ~ 2006   "put 3 stents in to  drain fluid" (03/08/2015) not successful, sent to [facility] to try to get last stent out  . HOT HEMOSTASIS N/A 08/07/2016   Procedure: HOT HEMOSTASIS (ARGON PLASMA COAGULATION/BICAP);  Surgeon: Gatha Mayer, MD;  Location: [facility];  Service: Endoscopy;  Laterality: N/A;  . INCISION AND DRAINAGE ABSCESS POSTERIOR CERVICALSPINE  05/2012  . JOINT REPLACEMENT    . MELANOMA EXCISION Left 2015   "lower leg; right at my knee"  . REFRACTIVE SURGERY Right ~ 2006 X 2   "twice; both done at [facility]" (03/08/2015  . SURGERY SCROTAL / TESTICULAR Right 1990's  . TEE WITHOUT CARDIOVERSION N/A 01/26/2013   Procedure: TRANSESOPHAGEAL ECHOCARDIOGRAM (TEE);  Surgeon: Lelon Perla, MD;  Location: [facility];  Service: Cardiovascular;  Laterality: N/A;  [name] anes. /   . TEE WITHOUT CARDIOVERSION N/A 10/05/2014   Procedure: TRANSESOPHAGEAL ECHOCARDIOGRAM (TEE)  with cardioversion;  Surgeon: Thayer Headings, MD;  Location[facility] ENDOSCOPY;  Service: Cardiovascular;  Laterality: N/A;  12:52 synched cardioversion at 120 joules,...afib to SR...12 lead EKG ordered.Marland KitchenMarland KitchenCardiozem d/c'ed per MD verbal order at SR  . TEE WITHOUT CARDIOVERSION N/A 03/23/2015   Procedure: TRANSESOPHAGEAL ECHOCARDIOGRAM (TEE);  Surgeon: Jerline Pain, MD;  Location: [facility];  Service: Cardiovascular;  Laterality: N/A;  . TEE WITHOUT CARDIOVERSION N/A 11/02/2019   Procedure: TRANSESOPHAGEAL ECHOCARDIOGRAM (TEE);  Surgeon: Josue Hector, MD;  Location: [facility] ENDOSCOPY;  Service: Cardiovascular;  Laterality: N/A;  . THORACIC SPINE SURGERY  03/2000   "ground calcium deposits from upper thoracic" (01/26/2013)  . TOTAL HIP ARTHROPLASTY Right 06/2007    Social History   Socioeconomic History  . Marital status: Married    Spouse name: Not on file  . Number of children: 3  . Years of education: Not on file  . Highest education level: Not on file  Occupational History  . Occupation: retired  Tobacco Use  . Smoking status: Former Smoker    Packs/day: 3.00    Years: 48.00    Pack years: 144.00    Types: Cigarettes    Quit date: 09/28/2000    Years since quitting: 20.4  . Smokeless tobacco: Never Used  Vaping Use  . Vaping Use: Never used  Substance and Sexual Activity  . Alcohol use: No  . Drug use: No  . Sexual activity: Yes  Other Topics Concern  . Not on file  Social History Narrative   Pt lives in [place] with spouse.  3 children are grown and healthy.   Retired.  Ran a country store for 30 years, previously worked in the [employer] for 16 years   Social Determinants of Radio broadcast assistant Strain: Not on [employer] Insecurity: Not on file  Transportation Needs: No Transportation Needs  .  Lack of Transportation (Medical): No  . Lack of Transportation (Non-Medical): No  Physical Activity: Inactive  . Days of Exercise per Week: 0 days  . Minutes of Exercise per Session: 0 min  Stress: Not on file  Social Connections: Not on file  Intimate Partner Violence: Not At Risk  . Fear of Current or Ex-Partner: No  . Emotionally Abused: No  . Physically Abused: No  . Sexually Abused: No    Current Outpatient Medications on File Prior to Visit  Medication Sig Dispense Refill  . calcitRIOL (ROCALTROL) 0.25 MCG capsule Take 0.25 mcg by mouth 3 (three) times a week.    . carvedilol (COREG) 12.5 MG tablet Take 12.5 mg by mouth 2 (two) times daily  with a meal.    . dorzolamide-timolol (COSOPT) 22.3-6.8 MG/ML ophthalmic solution Place 1 drop into the right eye 2 (two) times daily.    . empagliflozin (JARDIANCE) 10 MG TABS tablet Take 1 tablet (10 mg total) by mouth daily before breakfast. 90 tablet 3  . escitalopram (LEXAPRO) 20 MG tablet Take 1 tablet (20 mg total) by mouth daily. 90 tablet 0  . finasteride (PROSCAR) 5 MG tablet Take 1 tablet (5 mg total) by mouth daily. 30 tablet 2  . furosemide (LASIX) 40 MG tablet Take 1 tablet (40 mg total) by mouth every morning AND 0.5 tablets (20 mg total) every evening. 135 tablet 3  . gabapentin (NEURONTIN) 100 MG capsule Take 200 mg by mouth 2 (two) times daily.     [name] latanoprost (XALATAN) 0.005 % ophthalmic solution Place 1 drop into the right eye at bedtime.     . Netarsudil Dimesylate 0.02 % SOLN Apply 1 drop to eye daily.    . nitroGLYCERIN (NITROSTAT) 0.4 MG SL tablet Place 0.4 mg under the tongue every 5 (five) minutes as needed for chest pain.     [name] Glycol-Propyl Glycol 0.4-0.3 % SOLN Apply 1 drop to eye in the morning and at bedtime.    [name] REPATHA SURECLICK 093 MG/ML SOAJ Inject 1 pen into the skin every 14 (fourteen) days. 2 mL 11  . Rivaroxaban (XARELTO) 15 MG TABS tablet Take 1 tablet (15 mg total) by mouth daily with supper.  30 tablet 11  . sacubitril-valsartan (ENTRESTO) 24-26 MG Take 1 tablet by mouth 2 (two) times daily. Needs appt 60 tablet 0  . spironolactone (ALDACTONE) 25 MG tablet Take 1 tablet (25 mg total) by mouth daily. Take at bedtime 90 tablet 3  . tamsulosin (FLOMAX) 0.4 MG CAPS Take 0.4 mg by mouth at bedtime.     . carvedilol (COREG) 12.5 MG tablet Take 1 tablet (12.5 mg total) by mouth 2 (two) times daily. 60 tablet 3   No current facility-administered medications on file prior to visit.    Allergies  Allergen Reactions  . Pravastatin Sodium Other (See Comments)    Joint and muscle pain  . Azithromycin Diarrhea    Family History  Problem Relation Age of Onset  . Kidney disease Mother   . Heart disease Mother        MI, open heart  . Diabetes Mother        dialysis  . Leukemia Father   . Colon cancer Paternal Uncle   . Lung cancer Paternal Uncle        x 2  . Prostate cancer Paternal Uncle   . Diabetes Maternal Grandmother   . Heart attack Maternal Uncle   . Diabetes Maternal Aunt        x 3  . Diabetes Maternal Uncle   . Thyroid disease Sister     BP 110/68 (BP Location: Right Arm, Patient Position: Sitting, Cuff Size: Normal)   Pulse 75   Ht 5\' 5"  (1.651 m)   Wt 217 lb 6.4 oz (98.6 kg)   SpO2 97%   BMI 36.18 kg/m    Review of Systems Denies neck pain and sob.      Objective:   Physical Exam VITAL SIGNS:  See vs page GENERAL: no distress NECK: There is no palpable thyroid enlargement.  No thyroid nodule is palpable.  No palpable lymphadenopathy at the anterior neck.     [place] (2022): 2.5 cm right inferior TR 4 nodule  meets criteria for biopsy  Bx: Suspicious for malignancy (Bethesda category V)   I have reviewed outside records, and summarized: Pt was noted to have thyroid nodule, and referred here.  ddx of mult pulm nodules included metastatic thyroid cancer.      Assessment & Plan:  Thyr nodule: high risk of malignancy. pulm nodules.  High risk of these  being metastatic thyr ca  Patient Instructions  Let's check the nuclear medicine scan.  Also, please see a surgery specialist.  you will receive a phone call, about days and times for these appointments.   If you cannot safely undergo the surgery, and the lung nodules are from the thyroid, a second best option is to take a Radioactive iodine treatment pill (different from the test pill above), to try to destroy the lung nodules.

## 2021-02-17 ENCOUNTER — Ambulatory Visit (HOSPITAL_COMMUNITY)
Admission: RE | Admit: 2021-02-17 | Discharge: 2021-02-17 | Disposition: A | Discharge status: Home / Self Care | Payer: Medicare PPO | Source: Ambulatory Visit | Attending: Cardiology | Admitting: Cardiology

## 2021-02-17 ENCOUNTER — Other Ambulatory Visit: Payer: Self-pay

## 2021-02-17 DIAGNOSIS — I5022 Chronic systolic (congestive) heart failure: Secondary | ICD-10-CM | POA: Insufficient documentation

## 2021-02-17 LAB — BASIC METABOLIC PANEL
Anion gap: 5 (ref 5–15)
BUN: 17 mg/dL (ref 8–23)
CO2: 29 mmol/L (ref 22–32)
Calcium: 8.7 mg/dL — ABNORMAL LOW (ref 8.9–10.3)
Chloride: 105 mmol/L (ref 98–111)
Creatinine, Ser: 1.64 mg/dL — ABNORMAL HIGH (ref 0.61–1.24)
GFR, Estimated: 42 mL/min — ABNORMAL LOW (ref >60)
Glucose, Bld: 107 mg/dL — ABNORMAL HIGH (ref 70–99)
Potassium: 3.9 mmol/L (ref 3.5–5.1)
Sodium: 139 mmol/L (ref 135–145)

## 2021-02-22 DIAGNOSIS — H401113 Primary open-angle glaucoma, right eye, severe stage: Secondary | ICD-10-CM | POA: Diagnosis not present

## 2021-02-22 DIAGNOSIS — Z961 Presence of intraocular lens: Secondary | ICD-10-CM | POA: Diagnosis not present

## 2021-02-22 DIAGNOSIS — D3191 Benign neoplasm of unspecified part of right eye: Secondary | ICD-10-CM | POA: Diagnosis not present

## 2021-02-22 DIAGNOSIS — H2512 Age-related nuclear cataract, left eye: Secondary | ICD-10-CM | POA: Diagnosis not present

## 2021-02-22 DIAGNOSIS — H40012 Open angle with borderline findings, low risk, left eye: Secondary | ICD-10-CM | POA: Diagnosis not present

## 2021-02-24 ENCOUNTER — Other Ambulatory Visit: Payer: Self-pay | Admitting: Cardiology

## 2021-03-02 ENCOUNTER — Ambulatory Visit (INDEPENDENT_AMBULATORY_CARE_PROVIDER_SITE_OTHER): Payer: Medicare PPO

## 2021-03-02 DIAGNOSIS — I428 Other cardiomyopathies: Secondary | ICD-10-CM | POA: Diagnosis not present

## 2021-03-02 LAB — CUP PACEART REMOTE DEVICE CHECK
Battery Remaining Longevity: 24 mo
Battery Voltage: 2.88 V
Brady Statistic AP VP Percent: 78.28 %
Brady Statistic AP VS Percent: 0.1 %
Brady Statistic AS VP Percent: 21.32 %
Brady Statistic AS VS Percent: 0.3 %
Brady Statistic RA Percent Paced: 75.13 %
Brady Statistic RV Percent Paced: 94.94 %
Date Time Interrogation Session: 20220602012305
HighPow Impedance: 68 Ohm
Implantable Lead Implant Date: 20160607
Implantable Lead Implant Date: 20160607
Implantable Lead Implant Date: 20160607
Implantable Lead Location: 753858
Implantable Lead Location: 753859
Implantable Lead Location: 753860
Implantable Lead Model: 4598
Implantable Lead Model: 5076
Implantable Pulse Generator Implant Date: 20160607
Lead Channel Impedance Value: 1026 Ohm
Lead Channel Impedance Value: 1026 Ohm
Lead Channel Impedance Value: 1159 Ohm
Lead Channel Impedance Value: 1235 Ohm
Lead Channel Impedance Value: 1235 Ohm
Lead Channel Impedance Value: 380 Ohm
Lead Channel Impedance Value: 437 Ohm
Lead Channel Impedance Value: 494 Ohm
Lead Channel Impedance Value: 513 Ohm
Lead Channel Impedance Value: 665 Ohm
Lead Channel Impedance Value: 665 Ohm
Lead Channel Impedance Value: 760 Ohm
Lead Channel Impedance Value: 988 Ohm
Lead Channel Pacing Threshold Amplitude: 0.5 V
Lead Channel Pacing Threshold Amplitude: 0.625 V
Lead Channel Pacing Threshold Amplitude: 1.625 V
Lead Channel Pacing Threshold Pulse Width: 0.4 ms
Lead Channel Pacing Threshold Pulse Width: 0.4 ms
Lead Channel Pacing Threshold Pulse Width: 0.4 ms
Lead Channel Sensing Intrinsic Amplitude: 14 mV
Lead Channel Sensing Intrinsic Amplitude: 14 mV
Lead Channel Sensing Intrinsic Amplitude: 2 mV
Lead Channel Sensing Intrinsic Amplitude: 2 mV
Lead Channel Setting Pacing Amplitude: 1.5 V
Lead Channel Setting Pacing Amplitude: 2 V
Lead Channel Setting Pacing Amplitude: 2.25 V
Lead Channel Setting Pacing Pulse Width: 0.4 ms
Lead Channel Setting Pacing Pulse Width: 0.4 ms
Lead Channel Setting Sensing Sensitivity: 0.3 mV

## 2021-03-03 ENCOUNTER — Ambulatory Visit (INDEPENDENT_AMBULATORY_CARE_PROVIDER_SITE_OTHER): Payer: Medicare PPO

## 2021-03-03 DIAGNOSIS — I5022 Chronic systolic (congestive) heart failure: Secondary | ICD-10-CM | POA: Diagnosis not present

## 2021-03-03 DIAGNOSIS — Z95810 Presence of automatic (implantable) cardiac defibrillator: Secondary | ICD-10-CM | POA: Diagnosis not present

## 2021-03-03 NOTE — Progress Notes (Signed)
EPIC Encounter for ICM Monitoring  Patient Name: Chad Brown is a 80 y.o. male Date: 03/03/2021 Primary Care Physican: Ginger Organ., MD Primary [id number]:BVAPOL/IDCVUD Electrophysiologist: Allred Bi-V Pacing:94.9% 03/03/2021 Weight: 213-215lbs (baseline)  Time in AT/AF <0.1 hr/day (<0.1%)(taking Xarelto)  Spoke with patient and reports feeling well at this time.  Denies fluid symptoms.  Last visit with Dr Daune Perch.  Optivol thoracic impedancenormal.  Prescribed:  Furosemide40 mg1tablet(40 mg total)by mouth in the morning and 0.5 tablet (20 mg total)  every evening.   Spironolactone 25 mgTake 0.5 tablet (12.5 mg total) by mouth daily. Take at bedtime  Labs: 02/17/2021 Creatinine 1.64, BUN 17, Potassium 3.9, Sodium 139, GFR 42 02/07/2021 Creatinine 1.43, BUN 14, Potassium 3.8, Sodium 141, GFR 50  10/27/2020 Creatinine 1.34, BUN 17, Potassium 3.3, Sodium 137, GFR 54 A complete set of results can be found in Results Review.  Recommendations: No changes and encouraged to call if experiencing any fluid symptoms.  Follow-up plan: ICM clinic phone appointment on7/01/2021. 91 day device clinic remote transmission 06/01/2021.  EP/Cardiology NextOfficeVisit: 05/17/2021 with Dr Aundra Dubin. Recall 04/10/2021 with Tommye Standard, PA.Recall1/7/2023with Dr Rayann Heman  Copy of ICM check sent to Dr.Allred.  3 month ICM trend: 03/02/2021.    1 Year ICM trend:       Rosalene Billings, RN 03/03/2021 4:53 PM

## 2021-03-08 ENCOUNTER — Other Ambulatory Visit (HOSPITAL_COMMUNITY): Payer: Self-pay | Admitting: *Deleted

## 2021-03-08 DIAGNOSIS — D696 Thrombocytopenia, unspecified: Secondary | ICD-10-CM

## 2021-03-08 DIAGNOSIS — I5023 Acute on chronic systolic (congestive) heart failure: Secondary | ICD-10-CM

## 2021-03-09 ENCOUNTER — Other Ambulatory Visit: Payer: Self-pay

## 2021-03-09 ENCOUNTER — Inpatient Hospital Stay (HOSPITAL_COMMUNITY): Payer: Medicare PPO | Attending: Hematology

## 2021-03-09 ENCOUNTER — Ambulatory Visit (HOSPITAL_COMMUNITY)
Admission: RE | Admit: 2021-03-09 | Discharge: 2021-03-09 | Disposition: A | Discharge status: Home / Self Care | Payer: Medicare PPO | Source: Ambulatory Visit | Attending: Hematology | Admitting: Hematology

## 2021-03-09 DIAGNOSIS — Z800 Family history of malignant neoplasm of digestive organs: Secondary | ICD-10-CM | POA: Insufficient documentation

## 2021-03-09 DIAGNOSIS — C439 Malignant melanoma of skin, unspecified: Secondary | ICD-10-CM | POA: Diagnosis not present

## 2021-03-09 DIAGNOSIS — D696 Thrombocytopenia, unspecified: Secondary | ICD-10-CM

## 2021-03-09 DIAGNOSIS — N189 Chronic kidney disease, unspecified: Secondary | ICD-10-CM | POA: Diagnosis not present

## 2021-03-09 DIAGNOSIS — Z881 Allergy status to other antibiotic agents status: Secondary | ICD-10-CM | POA: Insufficient documentation

## 2021-03-09 DIAGNOSIS — Z79899 Other long term (current) drug therapy: Secondary | ICD-10-CM | POA: Insufficient documentation

## 2021-03-09 DIAGNOSIS — C4372 Malignant melanoma of left lower limb, including hip: Secondary | ICD-10-CM | POA: Diagnosis not present

## 2021-03-09 DIAGNOSIS — R911 Solitary pulmonary nodule: Secondary | ICD-10-CM | POA: Diagnosis not present

## 2021-03-09 DIAGNOSIS — I700 Atherosclerosis of aorta: Secondary | ICD-10-CM | POA: Insufficient documentation

## 2021-03-09 DIAGNOSIS — G4733 Obstructive sleep apnea (adult) (pediatric): Secondary | ICD-10-CM | POA: Insufficient documentation

## 2021-03-09 DIAGNOSIS — I252 Old myocardial infarction: Secondary | ICD-10-CM | POA: Insufficient documentation

## 2021-03-09 DIAGNOSIS — I2510 Atherosclerotic heart disease of native coronary artery without angina pectoris: Secondary | ICD-10-CM | POA: Diagnosis not present

## 2021-03-09 DIAGNOSIS — Z8249 Family history of ischemic heart disease and other diseases of the circulatory system: Secondary | ICD-10-CM | POA: Insufficient documentation

## 2021-03-09 DIAGNOSIS — K8020 Calculus of gallbladder without cholecystitis without obstruction: Secondary | ICD-10-CM | POA: Insufficient documentation

## 2021-03-09 DIAGNOSIS — I5023 Acute on chronic systolic (congestive) heart failure: Secondary | ICD-10-CM

## 2021-03-09 DIAGNOSIS — R42 Dizziness and giddiness: Secondary | ICD-10-CM | POA: Insufficient documentation

## 2021-03-09 DIAGNOSIS — E785 Hyperlipidemia, unspecified: Secondary | ICD-10-CM | POA: Diagnosis not present

## 2021-03-09 DIAGNOSIS — I517 Cardiomegaly: Secondary | ICD-10-CM | POA: Diagnosis not present

## 2021-03-09 DIAGNOSIS — I4891 Unspecified atrial fibrillation: Secondary | ICD-10-CM | POA: Insufficient documentation

## 2021-03-09 DIAGNOSIS — Z8719 Personal history of other diseases of the digestive system: Secondary | ICD-10-CM | POA: Insufficient documentation

## 2021-03-09 DIAGNOSIS — Z833 Family history of diabetes mellitus: Secondary | ICD-10-CM | POA: Insufficient documentation

## 2021-03-09 DIAGNOSIS — E041 Nontoxic single thyroid nodule: Secondary | ICD-10-CM | POA: Insufficient documentation

## 2021-03-09 DIAGNOSIS — Z87891 Personal history of nicotine dependence: Secondary | ICD-10-CM | POA: Insufficient documentation

## 2021-03-09 DIAGNOSIS — Z8349 Family history of other endocrine, nutritional and metabolic diseases: Secondary | ICD-10-CM | POA: Insufficient documentation

## 2021-03-09 DIAGNOSIS — D631 Anemia in chronic kidney disease: Secondary | ICD-10-CM | POA: Diagnosis not present

## 2021-03-09 DIAGNOSIS — R5383 Other fatigue: Secondary | ICD-10-CM | POA: Diagnosis not present

## 2021-03-09 DIAGNOSIS — I5022 Chronic systolic (congestive) heart failure: Secondary | ICD-10-CM | POA: Diagnosis not present

## 2021-03-09 DIAGNOSIS — Z8042 Family history of malignant neoplasm of prostate: Secondary | ICD-10-CM | POA: Insufficient documentation

## 2021-03-09 DIAGNOSIS — Z806 Family history of leukemia: Secondary | ICD-10-CM | POA: Insufficient documentation

## 2021-03-09 DIAGNOSIS — I7789 Other specified disorders of arteries and arterioles: Secondary | ICD-10-CM | POA: Diagnosis not present

## 2021-03-09 DIAGNOSIS — R197 Diarrhea, unspecified: Secondary | ICD-10-CM | POA: Insufficient documentation

## 2021-03-09 DIAGNOSIS — R059 Cough, unspecified: Secondary | ICD-10-CM | POA: Diagnosis not present

## 2021-03-09 DIAGNOSIS — Z7901 Long term (current) use of anticoagulants: Secondary | ICD-10-CM | POA: Diagnosis not present

## 2021-03-09 DIAGNOSIS — Z801 Family history of malignant neoplasm of trachea, bronchus and lung: Secondary | ICD-10-CM | POA: Diagnosis not present

## 2021-03-09 DIAGNOSIS — R918 Other nonspecific abnormal finding of lung field: Secondary | ICD-10-CM

## 2021-03-09 LAB — COMPREHENSIVE METABOLIC PANEL
ALT: 16 U/L (ref 0–44)
AST: 14 U/L — ABNORMAL LOW (ref 15–41)
Albumin: 3.5 g/dL (ref 3.5–5.0)
Alkaline Phosphatase: 70 U/L (ref 38–126)
Anion gap: 7 (ref 5–15)
BUN: 18 mg/dL (ref 8–23)
CO2: 30 mmol/L (ref 22–32)
Calcium: 8.4 mg/dL — ABNORMAL LOW (ref 8.9–10.3)
Chloride: 102 mmol/L (ref 98–111)
Creatinine, Ser: 1.4 mg/dL — ABNORMAL HIGH (ref 0.61–1.24)
GFR, Estimated: 51 mL/min — ABNORMAL LOW (ref >60)
Glucose, Bld: 113 mg/dL — ABNORMAL HIGH (ref 70–99)
Potassium: 3.7 mmol/L (ref 3.5–5.1)
Sodium: 139 mmol/L (ref 135–145)
Total Bilirubin: 0.6 mg/dL (ref 0.3–1.2)
Total Protein: 6.9 g/dL (ref 6.5–8.1)

## 2021-03-09 LAB — FERRITIN: Ferritin: 201 ng/mL (ref 24–336)

## 2021-03-09 LAB — CBC WITH DIFFERENTIAL/PLATELET
Abs Immature Granulocytes: 0.01 10*3/uL (ref 0.00–0.07)
Basophils Absolute: 0 10*3/uL (ref 0.0–0.1)
Basophils Relative: 1 %
Eosinophils Absolute: 0.1 10*3/uL (ref 0.0–0.5)
Eosinophils Relative: 2 %
HCT: 37.1 % — ABNORMAL LOW (ref 39.0–52.0)
Hemoglobin: 12 g/dL — ABNORMAL LOW (ref 13.0–17.0)
Immature Granulocytes: 0 %
Lymphocytes Relative: 19 %
Lymphs Abs: 1 10*3/uL (ref 0.7–4.0)
MCH: 30.9 pg (ref 26.0–34.0)
MCHC: 32.3 g/dL (ref 30.0–36.0)
MCV: 95.6 fL (ref 80.0–100.0)
Monocytes Absolute: 0.4 10*3/uL (ref 0.1–1.0)
Monocytes Relative: 7 %
Neutro Abs: 3.9 10*3/uL (ref 1.7–7.7)
Neutrophils Relative %: 71 %
Platelets: 154 10*3/uL (ref 150–400)
RBC: 3.88 MIL/uL — ABNORMAL LOW (ref 4.22–5.81)
RDW: 15.2 % (ref 11.5–15.5)
WBC: 5.4 10*3/uL (ref 4.0–10.5)
nRBC: 0 % (ref 0.0–0.2)

## 2021-03-09 LAB — LACTATE DEHYDROGENASE: LDH: 120 U/L (ref 98–192)

## 2021-03-09 LAB — IRON AND TIBC
Iron: 76 ug/dL (ref 45–182)
Saturation Ratios: 28 % (ref 17.9–39.5)
TIBC: 268 ug/dL (ref 250–450)
UIBC: 192 ug/dL

## 2021-03-09 MED ORDER — IOHEXOL 300 MG/ML  SOLN
75.0000 mL | Freq: Once | INTRAMUSCULAR | Status: AC | PRN
  Administered 2021-03-09: 75 mL via INTRAVENOUS

## 2021-03-15 NOTE — Progress Notes (Signed)
[place], [place] [address]   CLINIC:  Medical Oncology/Hematology  PCP:  Ginger Organ., MD [address] / [place] [address] [phone]   REASON FOR VISIT:  Follow-up for lung nodules, melanoma of left thigh and thrombocytopenia  PRIOR THERAPY:  Melanoma resection in 2015  NGS Results: not done  CURRENT THERAPY: surveillance  BRIEF ONCOLOGIC HISTORY:  Oncology History   No history exists.    CANCER STAGING: Cancer Staging No matching staging information was found for the patient.  INTERVAL HISTORY:  Chad Brown, a 80 y.o. male, returns for routine follow-up of his lung nodules, melanoma of left thigh and thrombocytopenia. Chad Brown was last seen on 11/03/2020.   Today Chad Brown reports feeling well. Chad Brown reports that Chad Brown is bleeding easily when cut. Chad Brown is taking iron every other day.   REVIEW OF SYSTEMS:  Review of Systems  Constitutional:  Positive for fatigue (50%). Negative for appetite change.  Respiratory:  Positive for cough.   Gastrointestinal:  Positive for diarrhea.  Neurological:  Positive for dizziness (positional).  Hematological:  Bruises/bleeds easily.  All other systems reviewed and are negative.  PAST MEDICAL/SURGICAL HISTORY:  Past Medical History:  Diagnosis Date   Adenomatous colon polyp    tubular   AICD (automatic cardioverter/defibrillator) present 03/08/2015   MDT CRTD    Anemia    iron deficient   Arthritis    "about all my joints; hands, knees, back" (03/08/2015)   Atherosclerosis    Cataract    left eye small   Cholelithiasis    gallstones   Chronic systolic CHF (congestive heart failure) ([place])    a. New dx 12/2012 ? NICM, may be r/t afib. b. Nuc 03/2013 - normal;  c. 03/2015 TEE EF 15-20%.   Colon polyp, hyperplastic 5/16   removed precancerous lesions   Depression    Diverticulosis    Glaucoma    right eye   Hyperlipidemia    Hypertension    Melanoma of eye ([place]) 2000's    "right; it's never been biopsied"   Melanoma of lower leg ([place]) 2015   "left; right at my knee"   Myocardial infarction ([place]) 1998   OSA (obstructive sleep apnea) 01/04/2016   uses cpap, pt does not know settings auto set   Peripheral vision loss, right 2006   Persistent atrial fibrillation ([place])    a. Dx 12/2012, s/p TEE/DCCV 01/26/13. b. On Xarelto (CHA2DS2VASc = 3);  c. 03/2015 TEE (EF 15-20%, no LAA thrombus) and DCCV - amio increased to 200 mg bid.   Urinary hesitancy due to benign prostatic hypertrophy    Past Surgical History:  Procedure Laterality Date   ATRIAL FIBRILLATION ABLATION N/A 11/03/2019   Procedure: ATRIAL FIBRILLATION ABLATION;  Surgeon: Thompson Grayer, MD;  Location: [facility];  Service: Cardiovascular;  Laterality: N/A;   BACK SURGERY     upper back, cannot turn neck well   [place] N/A 01/26/2013   Procedure: CARDIOVERSION;  Surgeon: Lelon Perla, MD;  Location: [facility];  Service: Cardiovascular;  Laterality: N/A;   CARDIOVERSION N/A 03/23/2015   Procedure: CARDIOVERSION;  Surgeon: Jerline Pain, MD;  Location: [facility];  Service: Cardiovascular;  Laterality: N/A;   CARDIOVERSION N/A 08/14/2017   Procedure: CARDIOVERSION;  Surgeon: Josue Hector, MD;  Location: [facility] ENDOSCOPY;  Service: Cardiovascular;  Laterality: N/A;   CATARACT EXTRACTION Right ~ 2006   COLONOSCOPY WITH  PROPOFOL N/A 02/10/2015   Procedure: COLONOSCOPY WITH PROPOFOL;  Surgeon: Gatha Mayer, MD;  Location: [facility];  Service: Endoscopy;  Laterality: N/A;   COLONOSCOPY WITH PROPOFOL N/A 08/07/2016   Procedure: COLONOSCOPY WITH PROPOFOL;  Surgeon: Gatha Mayer, MD;  Location: [facility];  Service: Endoscopy;  Laterality: N/A;   ENTEROSCOPY N/A 08/17/2015   Procedure: ENTEROSCOPY;  Surgeon: Gatha Mayer, MD;  Location: [facility];  Service: Endoscopy;  Laterality: N/A;   EP IMPLANTABLE DEVICE N/A 03/08/2015   MDT Hillery Aldo CRT-D for  nonischemic CM by Dr Rayann Heman for primary prevention   GLAUCOMA SURGERY Right ~ 2006   "put 3 stents in to drain fluid" (03/08/2015) not successful, sent to [facility] to try to get last stent out   HOT HEMOSTASIS N/A 08/07/2016   Procedure: HOT HEMOSTASIS (ARGON PLASMA COAGULATION/BICAP);  Surgeon: Gatha Mayer, MD;  Location: [facility];  Service: Endoscopy;  Laterality: N/A;   INCISION AND DRAINAGE ABSCESS POSTERIOR CERVICALSPINE  05/2012   JOINT REPLACEMENT     MELANOMA EXCISION Left 2015   "lower leg; right at my knee"   REFRACTIVE SURGERY Right ~ 2006 X 2   "twice; both done at [facility]" (03/08/2015   SURGERY SCROTAL / TESTICULAR Right 1990's   TEE WITHOUT CARDIOVERSION N/A 01/26/2013   Procedure: TRANSESOPHAGEAL ECHOCARDIOGRAM (TEE);  Surgeon: Lelon Perla, MD;  Location: [facility] ENDOSCOPY;  Service: Cardiovascular;  Laterality: N/A;  [name] anes. /    TEE WITHOUT CARDIOVERSION N/A 10/05/2014   Procedure: TRANSESOPHAGEAL ECHOCARDIOGRAM (TEE)  with cardioversion;  Surgeon: Thayer Headings, MD;  Location: [facility] ENDOSCOPY;  Service: Cardiovascular;  Laterality: N/A;  12:52 synched cardioversion at 120 joules,...afib to SR...12 lead EKG ordered.Marland KitchenMarland KitchenCardiozem d/c'ed per MD verbal order at SR   TEE WITHOUT CARDIOVERSION N/A 03/23/2015   Procedure: TRANSESOPHAGEAL ECHOCARDIOGRAM (TEE);  Surgeon: Jerline Pain, MD;  Location: [facility];  Service: Cardiovascular;  Laterality: N/A;   TEE WITHOUT CARDIOVERSION N/A 11/02/2019   Procedure: TRANSESOPHAGEAL ECHOCARDIOGRAM (TEE);  Surgeon: Josue Hector, MD;  Location: [facility] ENDOSCOPY;  Service: Cardiovascular;  Laterality: N/A;   THORACIC SPINE SURGERY  03/2000   "ground calcium deposits from upper thoracic" (01/26/2013)   TOTAL HIP ARTHROPLASTY Right 06/2007    SOCIAL HISTORY:  Social History   Socioeconomic History   Marital status: Married    Spouse name: Not on file   Number of children: 3   Years of education: Not on file   Highest education level: Not on file   Occupational History   Occupation: retired  Tobacco Use   Smoking status: Former    Packs/day: 3.00    Years: 48.00    Pack years: 144.00    Types: Cigarettes    Quit date: 09/28/2000    Years since quitting: 20.4   Smokeless tobacco: Never  Vaping Use   Vaping Use: Never used  Substance and Sexual Activity   Alcohol use: No   Drug use: No   Sexual activity: Yes  Other Topics Concern   Not on file  Social History Narrative   Pt lives in [place] with spouse.  3 children are grown and healthy.   Retired.  Ran a country store for 30 years, previously worked in the [employer] for 16 years   Social Determinants of Radio broadcast assistant Strain: Not on [employer] Insecurity: Not on file  Transportation Needs: No Transportation Needs   Lack of Transportation (Medical): No   Lack of Transportation (  Non-Medical): No  Physical Activity: Inactive   Days of Exercise per Week: 0 days   Minutes of Exercise per Session: 0 min  Stress: Not on file  Social Connections: Not on file  Intimate Partner Violence: Not At Risk   Fear of Current or Ex-Partner: No   Emotionally Abused: No   Physically Abused: No   Sexually Abused: No    FAMILY HISTORY:  Family History  Problem Relation Age of Onset   Kidney disease Mother    Heart disease Mother        MI, open heart   Diabetes Mother        dialysis   Leukemia Father    Colon cancer Paternal Uncle    Lung cancer Paternal Uncle        x 2   Prostate cancer Paternal Uncle    Diabetes Maternal Grandmother    Heart attack Maternal Uncle    Diabetes Maternal Aunt        x 3   Diabetes Maternal Uncle    Thyroid disease Sister     CURRENT MEDICATIONS:  Current Outpatient Medications  Medication Sig Dispense Refill   calcitRIOL (ROCALTROL) 0.25 MCG capsule Take 0.25 mcg by mouth 3 (three) times a week.     carvedilol (COREG) 12.5 MG tablet Take 12.5 mg by mouth 2 (two) times daily with a meal.     carvedilol  (COREG) 12.5 MG tablet Take 1 tablet (12.5 mg total) by mouth 2 (two) times daily. 60 tablet 3   dorzolamide-timolol (COSOPT) 22.3-6.8 MG/ML ophthalmic solution Place 1 drop into the right eye 2 (two) times daily.     empagliflozin (JARDIANCE) 10 MG TABS tablet Take 1 tablet (10 mg total) by mouth daily before breakfast. 90 tablet 3   ENTRESTO 24-26 MG TAKE ONE TABLET BY MOUTH TWICE DAILY 60 tablet 3   escitalopram (LEXAPRO) 20 MG tablet Take 1 tablet (20 mg total) by mouth daily. 90 tablet 0   finasteride (PROSCAR) 5 MG tablet Take 1 tablet (5 mg total) by mouth daily. 30 tablet 2   furosemide (LASIX) 40 MG tablet Take 1 tablet (40 mg total) by mouth every morning AND 0.5 tablets (20 mg total) every evening. 135 tablet 3   gabapentin (NEURONTIN) 100 MG capsule Take 200 mg by mouth 2 (two) times daily.      latanoprost (XALATAN) 0.005 % ophthalmic solution Place 1 drop into the right eye at bedtime.      Netarsudil Dimesylate 0.02 % SOLN Apply 1 drop to eye daily.     nitroGLYCERIN (NITROSTAT) 0.4 MG SL tablet Place 0.4 mg under the tongue every 5 (five) minutes as needed for chest pain.      Polyethyl Glycol-Propyl Glycol 0.4-0.3 % SOLN Apply 1 drop to eye in the morning and at bedtime.     REPATHA SURECLICK 785 MG/ML SOAJ Inject 1 pen into the skin every 14 (fourteen) days. 2 mL 11   Rivaroxaban (XARELTO) 15 MG TABS tablet Take 1 tablet (15 mg total) by mouth daily with supper. 30 tablet 11   spironolactone (ALDACTONE) 25 MG tablet Take 1 tablet (25 mg total) by mouth daily. Take at bedtime 90 tablet 3   tamsulosin (FLOMAX) 0.4 MG CAPS Take 0.4 mg by mouth at bedtime.      No current facility-administered medications for this visit.    ALLERGIES:  Allergies  Allergen Reactions   Pravastatin Sodium Other (See Comments)    Joint and muscle pain  Azithromycin Diarrhea    PHYSICAL EXAM:  Performance status (ECOG): 1 - Symptomatic but completely ambulatory  There were no vitals filed for  this visit. Wt Readings from Last 3 Encounters:  02/15/21 217 lb 6.4 oz (98.6 kg)  02/07/21 217 lb (98.4 kg)  12/28/20 218 lb (98.9 kg)   Physical Exam Vitals reviewed.  Constitutional:      Appearance: Normal appearance.  Cardiovascular:     Rate and Rhythm: Normal rate and regular rhythm.     Pulses: Normal pulses.     Heart sounds: Normal heart sounds.  Pulmonary:     Effort: Pulmonary effort is normal.     Breath sounds: Normal breath sounds.  Chest:  Breasts:    Right: No supraclavicular adenopathy.     Left: No supraclavicular adenopathy.  Lymphadenopathy:     Cervical: No cervical adenopathy.     Right cervical: No superficial cervical adenopathy.    Left cervical: No superficial cervical adenopathy.     Upper Body:     Right upper body: No supraclavicular adenopathy.     Left upper body: No supraclavicular adenopathy.  Neurological:     General: No focal deficit present.     Mental Status: Chad Brown is alert and oriented to person, place, and time.  Psychiatric:        Mood and Affect: Mood normal.        Behavior: Behavior normal.     LABORATORY DATA:  I have reviewed the labs as listed.  CBC Latest Ref Rng & Units 03/09/2021 10/27/2020 07/25/2020  WBC 4.0 - 10.5 K/uL 5.4 5.4 5.0  Hemoglobin 13.0 - 17.0 g/dL 12.0(L) 11.4(L) 11.2(L)  Hematocrit 39.0 - 52.0 % 37.1(L) 35.7(L) 34.9(L)  Platelets 150 - 400 K/uL 154 141(L) 153   CMP Latest Ref Rng & Units 03/09/2021 02/17/2021 02/07/2021  Glucose 70 - 99 mg/dL 113(H) 107(H) 106(H)  BUN 8 - 23 mg/dL 18 17 14   Creatinine 0.61 - 1.24 mg/dL 1.40(H) 1.64(H) 1.43(H)  Sodium 135 - 145 mmol/L 139 139 141  Potassium 3.5 - 5.1 mmol/L 3.7 3.9 3.8  Chloride 98 - 111 mmol/L 102 105 105  CO2 22 - 32 mmol/L 30 29 31   Calcium 8.9 - 10.3 mg/dL 8.4(L) 8.7(L) 8.5(L)  Total Protein 6.5 - 8.1 g/dL 6.9 - -  Total Bilirubin 0.3 - 1.2 mg/dL 0.6 - -  Alkaline Phos 38 - 126 U/L 70 - -  AST 15 - 41 U/L 14(L) - -  ALT 0 - 44 U/L 16 - -     DIAGNOSTIC IMAGING:  I have independently reviewed the scans and discussed with the patient. CT Chest W Contrast  Result Date: 03/10/2021 CLINICAL DATA:  Pulmonary nodule. EXAM: CT CHEST WITH CONTRAST TECHNIQUE: Multidetector CT imaging of the chest was performed during intravenous contrast administration. CONTRAST:  54mL OMNIPAQUE IOHEXOL 300 MG/ML  SOLN COMPARISON:  PET-CT 12/07/2020 FINDINGS: Cardiovascular: Heart is enlarged. Coronary artery calcification is evident. Atherosclerotic calcification is noted in the wall of the thoracic aorta. Left permanent pacemaker noted. Enlargement of the pulmonary outflow tract and main pulmonary arteries suggests pulmonary arterial hypertension. Mediastinum/Nodes: Stable right thyroid nodule status post biopsy 02/08/2021. This has been evaluated on previous imaging. (ref: J Am Coll Radiol. 2015 Feb;12(2): 143-50).No mediastinal lymphadenopathy. There is no hilar lymphadenopathy. The esophagus has normal imaging features. There is no axillary lymphadenopathy. Lungs/Pleura: Ill-defined nodular opacities identified in both lungs on the previous PET-CT have improved substantially in the interval and are completely resolved  in some regions. 12 mm ill-defined nodule in the anterior right upper lobe on 52/4 is new since prior. No focal airspace consolidation. No pleural effusion. Upper Abdomen: Incomplete visualization of calcified gallstone measuring at least 10 mm. Similar appearance of a 2.1 x 1.3 cm hypoattenuating pancreatic head lesion compared to 2.0 x 1.8 cm previously. Exophytic lesion posterior left kidney has been incompletely visualized but approaches water density and is likely a cyst. Musculoskeletal: No worrisome lytic or sclerotic osseous abnormality. IMPRESSION: 1. Ill-defined nodular opacities identified in both lungs on the previous PET-CT have improved substantially in the interval and are completely resolved in some regions. 2. There is a new 12 mm  ill-defined nodule in the anterior right upper lobe, likely infectious/inflammatory. Follow-up CT chest in 3 months recommended to reassess. 3. Similar appearance of a 2.1 cm hypoattenuating pancreatic head lesion. No reported hypermetabolism in this lesion on recent PET-CT. Continued attention on follow-up recommended. 4. Cholelithiasis. 5. Enlargement of the pulmonary outflow tract and main pulmonary arteries suggests pulmonary arterial hypertension. 6. Stable right thyroid nodule status post biopsy 02/08/2021. This has been evaluated on previous imaging. 7. Aortic Atherosclerosis (ICD10-I70.0). Electronically Signed   By: Misty Stanley M.D.   On: 03/10/2021 12:10   CUP PACEART REMOTE DEVICE CHECK  Result Date: 03/02/2021 Scheduled remote reviewed. Normal device function.  Next remote 91 days. Kathy Breach, RN, CCDS, [employer]    ASSESSMENT:  1.  Lung nodules: -Chad Brown was ex-smoker, smoked more than 2 packs/day for many years and quit in 2001. -CT chest on 07/25/2020 showed numerous (greater than 10) new poorly marginated subsolid pulmonary nodules scattered throughout both lungs, largest 1.3 cm in the posterior right middle lobe.  Infectious/inflammatory nodularity is more likely with pulmonary metastasis not entirely excluded.  No adenopathy.   2.  T2 N0 melanoma of the left thigh: -Status post resection in 2015 at [facility].   3.  CKD: -Follow-up with Dr. Theador Hawthorne.   4.  Mild thrombocytopenia: -Chad Brown has mild intermittent thrombocytopenia of several years duration.   5.  Normocytic anemia: -Combination anemia from CKD and relative iron deficiency.   PLAN:  1.  Lung nodules: - Reviewed CT chest with contrast from 03/09/2021 which showed ill-defined nodular opacities identified in the lungs on the previous PET scan from earlier this year have substantially improved in the interval.  New 12 mm ill-defined nodule in the anterior right upper lobe, likely infectious/inflammatory.  2.1 cm  hypoattenuating pancreatic head lesion with no hypermetabolism on the most recent PET scan.  Right thyroid nodule with uptake was seen on the PET scan. - RTC 6 months for follow-up.   2.  CKD: - Creatinine today is 1.4.  Continue follow-up with Dr. Theador Hawthorne.   3.  Mild thrombocytopenia: - Chad Brown has intermittent mild thrombocytopenia.  Most recent platelet count is normal at 154.   4.  Normocytic anemia: - Continue iron tablet every other day. - Ferritin improved to 201.  Hemoglobin also improved to 12.  5.  Right thyroid nodule: - FNA on 02/08/2021 was suspicious for malignancy, Bethesda category 5. - Afirma test was negative. - Chad Brown has a follow-up with Dr. Harlow Asa for consideration of thyroidectomy.   Orders placed this encounter:  No orders of the defined types were placed in this encounter.    Chad Jack, MD [place] [phone]   I, Thana Ates, am acting as a scribe for Dr. Derek Brown.  Kinnie Scales MD, have reviewed the  above documentation for accuracy and completeness, and I agree with the above.

## 2021-03-16 ENCOUNTER — Encounter (HOSPITAL_COMMUNITY): Payer: Self-pay

## 2021-03-16 ENCOUNTER — Other Ambulatory Visit: Payer: Self-pay

## 2021-03-16 ENCOUNTER — Inpatient Hospital Stay (HOSPITAL_BASED_OUTPATIENT_CLINIC_OR_DEPARTMENT_OTHER): Payer: Medicare PPO | Admitting: Hematology

## 2021-03-16 VITALS — BP 126/75 | HR 74 | Temp 97.7°F | Resp 18 | Wt 216.1 lb

## 2021-03-16 DIAGNOSIS — D696 Thrombocytopenia, unspecified: Secondary | ICD-10-CM | POA: Diagnosis not present

## 2021-03-16 DIAGNOSIS — R42 Dizziness and giddiness: Secondary | ICD-10-CM | POA: Diagnosis not present

## 2021-03-16 DIAGNOSIS — R918 Other nonspecific abnormal finding of lung field: Secondary | ICD-10-CM | POA: Diagnosis not present

## 2021-03-16 DIAGNOSIS — C439 Malignant melanoma of skin, unspecified: Secondary | ICD-10-CM

## 2021-03-16 DIAGNOSIS — R059 Cough, unspecified: Secondary | ICD-10-CM | POA: Diagnosis not present

## 2021-03-16 DIAGNOSIS — N189 Chronic kidney disease, unspecified: Secondary | ICD-10-CM | POA: Diagnosis not present

## 2021-03-16 DIAGNOSIS — D631 Anemia in chronic kidney disease: Secondary | ICD-10-CM | POA: Diagnosis not present

## 2021-03-16 DIAGNOSIS — C4372 Malignant melanoma of left lower limb, including hip: Secondary | ICD-10-CM | POA: Diagnosis not present

## 2021-03-16 DIAGNOSIS — R197 Diarrhea, unspecified: Secondary | ICD-10-CM | POA: Diagnosis not present

## 2021-03-16 DIAGNOSIS — R911 Solitary pulmonary nodule: Secondary | ICD-10-CM | POA: Diagnosis not present

## 2021-03-16 DIAGNOSIS — R5383 Other fatigue: Secondary | ICD-10-CM | POA: Diagnosis not present

## 2021-03-16 NOTE — Patient Instructions (Addendum)
[facility] at [facility] Discharge Instructions  You were seen today by Dr. Katragadda. He went over your recent results. Dr. Katragadda will see you back in 6 months for labs and follow up.   Thank you for choosing  Cancer Center at [facility] to provide your oncology and hematology care.  To afford each patient quality time with our provider, please arrive at least 15 minutes before your scheduled appointment time.   If you have a lab appointment with the Cancer Center please come in thru the Main Entrance and check in at the main information desk  You need to re-schedule your appointment should you arrive 10 or more minutes late.  We strive to give you quality time with our providers, and arriving late affects you and other patients whose appointments are after yours.  Also, if you no show three or more times for appointments you may be dismissed from the clinic at the providers discretion.     Again, thank you for choosing [facility].  Our hope is that these requests will decrease the amount of time that you wait before being seen by our physicians.       _____________________________________________________________  Should you have questions after your visit to [facility], please contact our office at [phone] between the hours of 8:00 a.m. and 4:30 p.m.  Voicemails left after 4:00 p.m. will not be returned until the following business day.  For prescription refill requests, have your pharmacy contact our office and allow 72 hours.    Cancer Center Support Programs:   > Cancer Support Group  2nd Tuesday of the month 1pm-2pm, Journey Room    

## 2021-03-22 ENCOUNTER — Other Ambulatory Visit: Payer: Self-pay

## 2021-03-22 ENCOUNTER — Ambulatory Visit (HOSPITAL_COMMUNITY)
Admission: RE | Admit: 2021-03-22 | Discharge: 2021-03-22 | Disposition: A | Discharge status: Home / Self Care | Payer: Medicare PPO | Source: Ambulatory Visit | Attending: Cardiology | Admitting: Cardiology

## 2021-03-22 DIAGNOSIS — I4891 Unspecified atrial fibrillation: Secondary | ICD-10-CM | POA: Diagnosis not present

## 2021-03-22 DIAGNOSIS — I429 Cardiomyopathy, unspecified: Secondary | ICD-10-CM | POA: Diagnosis not present

## 2021-03-22 DIAGNOSIS — I5022 Chronic systolic (congestive) heart failure: Secondary | ICD-10-CM | POA: Insufficient documentation

## 2021-03-22 DIAGNOSIS — I2510 Atherosclerotic heart disease of native coronary artery without angina pectoris: Secondary | ICD-10-CM | POA: Diagnosis not present

## 2021-03-22 DIAGNOSIS — Z950 Presence of cardiac pacemaker: Secondary | ICD-10-CM | POA: Diagnosis not present

## 2021-03-22 DIAGNOSIS — E669 Obesity, unspecified: Secondary | ICD-10-CM | POA: Diagnosis not present

## 2021-03-22 LAB — ECHOCARDIOGRAM COMPLETE
Area-P 1/2: 3.27 cm2
P 1/2 time: 955 msec
S' Lateral: 5.2 cm
Single Plane A4C EF: 36.8 %

## 2021-03-22 NOTE — Progress Notes (Signed)
*  PRELIMINARY RESULTS* Echocardiogram 2D Echocardiogram has been performed.  Dustin Flock, RCS 03/22/2021, 11:46 AM

## 2021-03-24 NOTE — Progress Notes (Signed)
Remote ICD transmission.   

## 2021-04-04 ENCOUNTER — Ambulatory Visit (INDEPENDENT_AMBULATORY_CARE_PROVIDER_SITE_OTHER): Payer: Medicare PPO

## 2021-04-04 ENCOUNTER — Telehealth: Payer: Self-pay

## 2021-04-04 DIAGNOSIS — Z95810 Presence of automatic (implantable) cardiac defibrillator: Secondary | ICD-10-CM

## 2021-04-04 DIAGNOSIS — I5022 Chronic systolic (congestive) heart failure: Secondary | ICD-10-CM | POA: Diagnosis not present

## 2021-04-04 NOTE — Telephone Encounter (Signed)
Attempted ICM Call to patient to request remote transmission for fluid level review.  No answer or voice mail option.

## 2021-04-05 NOTE — Progress Notes (Signed)
EPIC Encounter for ICM Monitoring  Patient Name: Chad Brown is a 80 y.o. male Date: 04/05/2021 Primary Care Physican: Ginger Organ., MD Primary Cardiologist: Nishan/McLean Electrophysiologist: Allred Bi-V Pacing: 96.6%   04/05/2021 Weight: 209-211 lbs (baseline)   Time in AT/AF   0.0 hr/day (0.0%) (taking Xarelto)    Spoke with patient and heart failure questions reviewed.  Pt asymptomatic for fluid accumulation and feeing well.  He has a thyroid nodule that is being evaluated.    Optivol thoracic impedance normal.   Prescribed:  Furosemide 40 mg 1 tablet (40 mg total) by mouth in the morning and 0.5 tablet (20 mg total)  every evening. Spironolactone 25 mg Take 0.5 tablet (12.5 mg total) by mouth daily. Take at bedtime   Labs: 03/09/2021 Creatinine 1.40, BUN 18, Potassium 3.7, Sodium 139, GFR 51 02/17/2021 Creatinine 1.64, BUN 17, Potassium 3.9, Sodium 139, GFR 42 02/07/2021 Creatinine 1.43, BUN 14,Potassium 3.8, Sodium 141, GFR 50  10/27/2020 Creatinine 1.34, BUN 17, Potassium 3.3, Sodium 137, GFR 54 A complete set of results can be found in Results Review.   Recommendations:  No changes and encouraged to call if experiencing any fluid symptoms.   Follow-up plan: ICM clinic phone appointment on 05/08/2021.  91 day device clinic remote transmission 06/01/2021.     EP/Cardiology Next Office Visit:  05/17/2021 with Dr Aundra Dubin.  Recall 04/10/2021 with Tommye Standard, PA.   Recall 10/07/2021 with Dr Rayann Heman     Copy of ICM check sent to Dr. Rayann Heman.   3 month ICM trend: 04/05/2021.    1 Year ICM trend:       Rosalene Billings, RN 04/05/2021 8:00 AM

## 2021-04-13 ENCOUNTER — Ambulatory Visit: Payer: Self-pay | Admitting: Surgery

## 2021-04-13 DIAGNOSIS — E041 Nontoxic single thyroid nodule: Secondary | ICD-10-CM | POA: Diagnosis not present

## 2021-04-13 DIAGNOSIS — D440 Neoplasm of uncertain behavior of thyroid gland: Secondary | ICD-10-CM | POA: Diagnosis not present

## 2021-04-13 DIAGNOSIS — E042 Nontoxic multinodular goiter: Secondary | ICD-10-CM | POA: Diagnosis not present

## 2021-04-14 ENCOUNTER — Telehealth: Payer: Self-pay

## 2021-04-14 ENCOUNTER — Telehealth: Payer: Self-pay | Admitting: *Deleted

## 2021-04-14 NOTE — Telephone Encounter (Signed)
   [facility] Pre-operative Risk Assessment    Patient Name: Chad Brown  DOB: 06-07-1941 MRN: [id number]  HEARTCARE STAFF:  - IMPORTANT!!!!!! Under Visit Info/Reason for Call, type in Other and utilize the format Clearance MM/DD/YY or Clearance TBD. Do not use dashes or single digits. - Please review there is not already an duplicate clearance open for this procedure. - If request is for dental extraction, please clarify the # of teeth to be extracted. - If the patient is currently at the dentist's office, call Pre-Op Callback Staff (MA/nurse) to input urgent request.  - If the patient is not currently in the dentist office, please route to the Pre-Op pool.  Request for surgical clearance:  What type of surgery is being performed? THYROID SURGERY  When is this surgery scheduled? TBD  What type of clearance is required (medical clearance vs. Pharmacy clearance to hold med vs. Both)? BOTH  Are there any medications that need to be held prior to surgery and how long? [facility]  Practice name and name of physician performing surgery? [facility]; DR. Armandina Gemma  What is the office phone number? [phone]   7.   What is the office fax number? [phone] ATTNMammie Lorenzo, LPN  8.   Anesthesia type (None, local, MAC, general) ? GENERAL   Julaine Hua 04/14/2021, 5:42 PM  _________________________________________________________________   (provider comments below)

## 2021-04-14 NOTE — Telephone Encounter (Signed)
NOTES ON FILE FROM [facility] [phone] SENT REFERRAL TO SCHEDULING..... 

## 2021-04-17 NOTE — Telephone Encounter (Signed)
   Primary Cardiologist: [name], MD  Chart reviewed as part of pre-operative protocol coverage. Given past medical history and time since last visit, based on ACC/AHA guidelines, Chad Brown would be at acceptable risk for the planned procedure without further cardiovascular testing.   Patient with diagnosis of afib on Xarelto for anticoagulation.     Procedure: THYROID SURGERY Date of procedure: TBD   CHA2DS2-VASc Score = 5  This indicates a 7.2% annual risk of stroke. The patient's score is based upon: CHF History: Yes HTN History: Yes Diabetes History: No Stroke History: No Vascular Disease History: Yes Age Score: 2 Gender Score: 0    CrCl 45 ml/min Platelet count 154   Per office protocol, patient can hold Xarelto for 2 days prior to procedure.  I will route this recommendation to the requesting party via Epic fax function and remove from pre-op pool.  Please call with questions.  Chad Ng. Yeshua Stryker NP-C    04/17/2021, 1:30 PM [address] [place] [address] Office [phone] Fax [phone]

## 2021-04-17 NOTE — Telephone Encounter (Signed)
Patient with diagnosis of afib on Xarelto for anticoagulation.    Procedure: THYROID SURGERY Date of procedure: TBD  CHA2DS2-VASc Score = 5  This indicates a 7.2% annual risk of stroke. The patient's score is based upon: CHF History: Yes HTN History: Yes Diabetes History: No Stroke History: No Vascular Disease History: Yes Age Score: 2 Gender Score: 0    CrCl 45 ml/min Platelet count 154  Per office protocol, patient can hold Xarelto for 2 days prior to procedure.

## 2021-04-19 ENCOUNTER — Encounter (HOSPITAL_COMMUNITY): Payer: Self-pay | Admitting: Oncology

## 2021-04-21 DIAGNOSIS — N1831 Chronic kidney disease, stage 3a: Secondary | ICD-10-CM | POA: Diagnosis not present

## 2021-04-21 DIAGNOSIS — E211 Secondary hyperparathyroidism, not elsewhere classified: Secondary | ICD-10-CM | POA: Diagnosis not present

## 2021-04-21 DIAGNOSIS — I5042 Chronic combined systolic (congestive) and diastolic (congestive) heart failure: Secondary | ICD-10-CM | POA: Diagnosis not present

## 2021-04-21 DIAGNOSIS — D638 Anemia in other chronic diseases classified elsewhere: Secondary | ICD-10-CM | POA: Diagnosis not present

## 2021-04-27 DIAGNOSIS — N1831 Chronic kidney disease, stage 3a: Secondary | ICD-10-CM | POA: Diagnosis not present

## 2021-04-27 DIAGNOSIS — E211 Secondary hyperparathyroidism, not elsewhere classified: Secondary | ICD-10-CM | POA: Diagnosis not present

## 2021-04-27 DIAGNOSIS — D638 Anemia in other chronic diseases classified elsewhere: Secondary | ICD-10-CM | POA: Diagnosis not present

## 2021-04-27 DIAGNOSIS — I5042 Chronic combined systolic (congestive) and diastolic (congestive) heart failure: Secondary | ICD-10-CM | POA: Diagnosis not present

## 2021-04-27 DIAGNOSIS — I129 Hypertensive chronic kidney disease with stage 1 through stage 4 chronic kidney disease, or unspecified chronic kidney disease: Secondary | ICD-10-CM | POA: Diagnosis not present

## 2021-05-01 ENCOUNTER — Encounter (HOSPITAL_COMMUNITY)
Admission: RE | Admit: 2021-05-01 | Discharge: 2021-05-01 | Disposition: A | Discharge status: Home / Self Care | Payer: Medicare PPO | Source: Ambulatory Visit | Attending: Surgery | Admitting: Surgery

## 2021-05-01 ENCOUNTER — Encounter: Payer: Self-pay | Admitting: Internal Medicine

## 2021-05-01 ENCOUNTER — Ambulatory Visit (HOSPITAL_COMMUNITY)
Admission: RE | Admit: 2021-05-01 | Discharge: 2021-05-01 | Disposition: A | Discharge status: Home / Self Care | Payer: Medicare PPO | Source: Ambulatory Visit | Attending: Anesthesiology | Admitting: Anesthesiology

## 2021-05-01 ENCOUNTER — Encounter (HOSPITAL_COMMUNITY): Payer: Self-pay

## 2021-05-01 ENCOUNTER — Other Ambulatory Visit: Payer: Self-pay

## 2021-05-01 DIAGNOSIS — I509 Heart failure, unspecified: Secondary | ICD-10-CM | POA: Insufficient documentation

## 2021-05-01 DIAGNOSIS — D440 Neoplasm of uncertain behavior of thyroid gland: Secondary | ICD-10-CM | POA: Diagnosis not present

## 2021-05-01 DIAGNOSIS — Z01818 Encounter for other preprocedural examination: Secondary | ICD-10-CM | POA: Insufficient documentation

## 2021-05-01 DIAGNOSIS — Z95810 Presence of automatic (implantable) cardiac defibrillator: Secondary | ICD-10-CM | POA: Diagnosis not present

## 2021-05-01 DIAGNOSIS — Z79899 Other long term (current) drug therapy: Secondary | ICD-10-CM | POA: Diagnosis not present

## 2021-05-01 DIAGNOSIS — I4891 Unspecified atrial fibrillation: Secondary | ICD-10-CM | POA: Insufficient documentation

## 2021-05-01 DIAGNOSIS — I517 Cardiomegaly: Secondary | ICD-10-CM | POA: Diagnosis not present

## 2021-05-01 DIAGNOSIS — Z7901 Long term (current) use of anticoagulants: Secondary | ICD-10-CM | POA: Diagnosis not present

## 2021-05-01 DIAGNOSIS — I110 Hypertensive heart disease with heart failure: Secondary | ICD-10-CM | POA: Diagnosis not present

## 2021-05-01 DIAGNOSIS — Z7984 Long term (current) use of oral hypoglycemic drugs: Secondary | ICD-10-CM | POA: Diagnosis not present

## 2021-05-01 DIAGNOSIS — G4733 Obstructive sleep apnea (adult) (pediatric): Secondary | ICD-10-CM | POA: Insufficient documentation

## 2021-05-01 DIAGNOSIS — K219 Gastro-esophageal reflux disease without esophagitis: Secondary | ICD-10-CM | POA: Diagnosis not present

## 2021-05-01 HISTORY — DX: Pneumonia, unspecified organism: J18.9

## 2021-05-01 HISTORY — DX: Gastro-esophageal reflux disease without esophagitis: K21.9

## 2021-05-01 LAB — BASIC METABOLIC PANEL
Anion gap: 7 (ref 5–15)
BUN: 24 mg/dL — ABNORMAL HIGH (ref 8–23)
CO2: 31 mmol/L (ref 22–32)
Calcium: 9.2 mg/dL (ref 8.9–10.3)
Chloride: 104 mmol/L (ref 98–111)
Creatinine, Ser: 1.43 mg/dL — ABNORMAL HIGH (ref 0.61–1.24)
GFR, Estimated: 50 mL/min — ABNORMAL LOW (ref >60)
Glucose, Bld: 113 mg/dL — ABNORMAL HIGH (ref 70–99)
Potassium: 4.6 mmol/L (ref 3.5–5.1)
Sodium: 142 mmol/L (ref 135–145)

## 2021-05-01 LAB — CBC
HCT: 39.3 % (ref 39.0–52.0)
Hemoglobin: 12.6 g/dL — ABNORMAL LOW (ref 13.0–17.0)
MCH: 30.9 pg (ref 26.0–34.0)
MCHC: 32.1 g/dL (ref 30.0–36.0)
MCV: 96.3 fL (ref 80.0–100.0)
Platelets: 141 10*3/uL — ABNORMAL LOW (ref 150–400)
RBC: 4.08 MIL/uL — ABNORMAL LOW (ref 4.22–5.81)
RDW: 15.1 % (ref 11.5–15.5)
WBC: 6.2 10*3/uL (ref 4.0–10.5)
nRBC: 0 % (ref 0.0–0.2)

## 2021-05-01 LAB — HEMOGLOBIN A1C
Hgb A1c MFr Bld: 5.9 % — ABNORMAL HIGH (ref 4.8–5.6)
Mean Plasma Glucose: 122.63 mg/dL

## 2021-05-01 LAB — GLUCOSE, CAPILLARY: Glucose-Capillary: 103 mg/dL — ABNORMAL HIGH (ref 70–99)

## 2021-05-01 NOTE — Progress Notes (Addendum)
PCP - Marton Redwood Cardiologist - Clearance Coletta Memos, NP 04-17-21 in notes. Dr. [name] ,  Also sees Jetta Lout Allred  PPM/ICD - Defib/ pacemaker  Device Orders - yes  Rep Notified - yes Gae Dry   Chest x-ray -  EKG - 02-07-21 Stress Test -  ECHO - 03-22-21 Cardiac Cath -   Sleep Study -  CPAP -   Fasting Blood Sugar -  Checks Blood Sugar _____ times a day  Blood Thinner Instructions: Xarelto hold 2 days Aspirin Instructions:  ERAS Protcol - PRE-SURGERY Ensure or G2-   COVID TEST- 05-08-21  ACTIVITY---Able to walk to mailbox without SOB  Able to complete ADL's without SOB Anesthesia review: CHF, HTN, MI 1998,OSA hasn't used CPAP in over a year or two  Patient denies shortness of breath, fever, cough and chest pain at PAT appointment   All instructions explained to the patient, with a verbal understanding of the material. Patient agrees to go over the instructions while at home for a better understanding. Patient also instructed to self quarantine after being tested for COVID-19. The opportunity to ask questions was provided.

## 2021-05-01 NOTE — Progress Notes (Signed)
[facility]GRAMM[facility]  Patient Information: Name:  Chad Brown  DOB:  1941/03/25  MRN:  [id number]    Device Information: Planned Procedure: total thyroidectomy  Surgeon:  Armandina Gemma  Date of Procedure: 05-11-21  Cautery will be used. N/A  Position during surgery: N/A   Please send documentation back to:  [facility] (Fax # [phone] Clinic EP Physician:  Thompson Grayer, MD   Device Type:  Defibrillator Manufacturer and Phone #:  [employer]: [id number] Pacemaker Dependent?:  No. Date of Last Device Check:  04/05/21 (Remote) Normal Device Function?:  Yes.    Electrophysiologist's Recommendations:  Have magnet available. Provide continuous ECG monitoring when magnet is used or reprogramming is to be performed.  Procedure may interfere with device function.  Magnet should be placed over device during procedure.  Per Device Clinic Standing Orders, Simone Curia, RN  3:25 PM 05/01/2021

## 2021-05-01 NOTE — Patient Instructions (Addendum)
DUE TO COVID-19 ONLY ONE VISITOR IS ALLOWED TO COME WITH YOU AND STAY IN THE WAITING ROOM ONLY DURING PRE OP AND PROCEDURE DAY OF SURGERY.   TWO VISITOR  MAY VISIT WITH YOU AFTER SURGERY IN YOUR PRIVATE ROOM DURING VISITING HOURS ONLY!  YOU NEED TO HAVE A COVID 19 TEST ON__8-8-22_____ @_______ , THIS TEST MUST BE DONE BEFORE SURGERY,    COVID TESTING SITE  [address] [place], [place] TEST IS COMPLETED,  PLEASE Wear a mask when in public           Your procedure is scheduled on: 05-11-21   Report to [facility] Main  Entrance   Report to admitting at     0830  AM     Call this number if you have problems the morning of surgery [phone]    Remember: Do not eat food  :After Midnight. You may have clear liquids until  0730 am then nothing by mouth    CLEAR LIQUID DIET                                                                    water Black Coffee and tea, regular and decaf                             Plain Jell-O any favor except red or purple                                  Fruit ices (not with fruit pulp)                                      Iced Popsicles                                     Carbonated beverages, regular and diet                                    Cranberry, grape and apple juices Sports drinks like Gatorade Lightly seasoned clear broth or consume(fat free) Sugar, honey syrup   _____________________________________________________________________    BRUSH YOUR TEETH MORNING OF SURGERY AND RINSE YOUR MOUTH OUT, NO CHEWING GUM CANDY OR MINTS.     Take these medicines the morning of surgery with A SIP OF WATER: gabapentin, carvedilol, eye drops as usual, LEXAPRO( escitalopram) , FINASTERIDE(proscar)  HOLD JARDIANCE DAY BEFORE SURGERY   DO NOT TAKE ANY DIABETIC MEDICATIONS DAY OF YOUR SURGERY                               You may not have any metal on your body including hair pins and               piercings  Do not wear jewelry, lotions, powders or perfumes, deodorant         .  Men may shave face and neck.   Do not bring valuables to the hospital. [place].  Contacts, dentures or bridgework may not be worn into surgery.       Patients discharged the day of surgery will not be allowed to drive home. IF YOU ARE HAVING SURGERY AND GOING HOME THE SAME DAY, YOU MUST HAVE AN ADULT TO DRIVE YOU HOME AND BE WITH YOU FOR 24 HOURS. YOU MAY GO HOME BY TAXI OR UBER OR ORTHERWISE, BUT AN ADULT MUST ACCOMPANY YOU HOME AND STAY WITH YOU FOR 24 HOURS.  Name and phone number of your driver:  Special Instructions: N/A              Please read over the following fact sheets you were given: _____________________________________________________________________             [facility] - Preparing for Surgery Before surgery, you can play an important role.  Because skin is not sterile, your skin needs to be as free of germs as possible.  You can reduce the number of germs on your skin by washing with CHG (chlorahexidine gluconate) soap before surgery.  CHG is an antiseptic cleaner which kills germs and bonds with the skin to continue killing germs even after washing. Please DO NOT use if you have an allergy to CHG or antibacterial soaps.  If your skin becomes reddened/irritated stop using the CHG and inform your nurse when you arrive at Short Stay. Do not shave (including legs and underarms) for at least 48 hours prior to the first CHG shower.  You may shave your face/neck. Please follow these instructions carefully:  1.  Shower with CHG Soap the night before surgery and the  morning of Surgery.  2.  If you choose to wash your hair, wash your hair first as usual with your  normal  shampoo.  3.  After you shampoo, rinse your hair and body thoroughly to remove the  shampoo.                           4.  Use CHG as you would any other liquid soap.   You can apply chg directly  to the skin and wash                       Gently with a scrungie or clean washcloth.  5.  Apply the CHG Soap to your body ONLY FROM THE NECK DOWN.   Do not use on face/ open                           Wound or open sores. Avoid contact with eyes, ears mouth and genitals (private parts).                       Wash face,  Genitals (private parts) with your normal soap.             6.  Wash thoroughly, paying special attention to the area where your surgery  will be performed.  7.  Thoroughly rinse your body with warm water from the neck down.  8.  DO NOT shower/wash with your normal soap after using and rinsing off  the CHG Soap.  9.  Pat yourself dry with a clean towel.            10.  Wear clean pajamas.            11.  Place clean sheets on your bed the night of your first shower and do not  sleep with pets. Day of Surgery : Do not apply any lotions/deodorants the morning of surgery.  Please wear clean clothes to the hospital/surgery center.  FAILURE TO FOLLOW THESE INSTRUCTIONS MAY RESULT IN THE CANCELLATION OF YOUR SURGERY PATIENT SIGNATURE_________________________________  NURSE SIGNATURE__________________________________  ________________________________________________________________________

## 2021-05-03 NOTE — Progress Notes (Signed)
Anesthesia Chart Review   Case: [id number] Date/Time: 05/11/21 1015   Procedure: TOTAL THYROIDECTOMY   Anesthesia type: General   Pre-op diagnosis: THYROID NEOPLASM OF UNCERTAIN BEHAVIOR   Location: [facility] ROOM 05 / [facility]   Surgeons: Armandina Gemma, MD       DISCUSSION:80 y.o. former smoker with h/o GERD, HTN, OSA, CHF, AICD in place (device orders in 05/01/2021 progress note), atrial fibrillation (on Xarelto), thyroid neoplasm scheduled for above procedure 05/11/21 with Dr. Armandina Gemma.   Per cardiology preoperative evaluation 04/17/2021, "Chart reviewed as part of pre-operative protocol coverage. Given past medical history and time since last visit, based on ACC/AHA guidelines, Chad Brown would be at acceptable risk for the planned procedure without further cardiovascular testing.   Per office protocol, patient can hold Xarelto for 2 days prior to procedure."  VS: BP 120/71   Pulse 64   Temp 36.5 C (Oral)   Resp 16   Ht 5\' 6"  (1.676 m)   Wt 96.2 kg   SpO2 99%   BMI 34.22 kg/m   PROVIDERS: Ginger Organ., MD is PCP   Loralie Champagne, MD is Cardiologist  LABS: Labs reviewed: Acceptable for surgery. (all labs ordered are listed, but only abnormal results are displayed)  Labs Reviewed  BASIC METABOLIC PANEL - Abnormal; Notable for the following components:      Result Value   Glucose, Bld 113 (*)    BUN 24 (*)    Creatinine, Ser 1.43 (*)    GFR, Estimated 50 (*)    All other components within normal limits  HEMOGLOBIN A1C - Abnormal; Notable for the following components:   Hgb A1c MFr Bld 5.9 (*)    All other components within normal limits  CBC - Abnormal; Notable for the following components:   RBC 4.08 (*)    Hemoglobin 12.6 (*)    Platelets 141 (*)    All other components within normal limits  GLUCOSE, CAPILLARY - Abnormal; Notable for the following components:   Glucose-Capillary 103 (*)    All other components within normal limits      IMAGES:   EKG: 02/07/2021 Rate 62 bpm  AV dual-pace rhythm   CV: Echo 03/22/21 1. Left ventricular ejection fraction, by estimation, is 40 to 45%. The  left ventricle has mildly decreased function. The left ventricle  demonstrates global hypokinesis. The left ventricular internal cavity size  was moderately dilated. There is mild  concentric left ventricular hypertrophy. Left ventricular diastolic  parameters are consistent with Grade III diastolic dysfunction  (restrictive).   2. Right ventricular systolic function is normal. The right ventricular  size is normal. There is mildly elevated pulmonary artery systolic  pressure.   3. Left atrial size was moderately dilated.   4. Right atrial size was moderately dilated.   5. The mitral valve is normal in structure. Trivial mitral valve  regurgitation. No evidence of mitral stenosis.   6. The aortic valve is tricuspid. Aortic valve regurgitation is trivial.  Stress Test 03/19/2013 Notes Recorded by Josue Hector, MD on 03/19/2013 at 9:43 AM Normal myovue study with no evidence of ischemia or infarction Past Medical History:  Diagnosis Date   Adenomatous colon polyp    tubular   AICD (automatic cardioverter/defibrillator) present 03/08/2015   MDT CRTD dual pacemaker and defib   Anemia    iron deficient   Arthritis    "about all my joints; hands, knees, back" (03/08/2015)   Atherosclerosis    Cataract  left eye small   Cholelithiasis    gallstones   Chronic systolic CHF (congestive heart failure) ([place])    a. New dx 12/2012 ? NICM, may be r/t afib. b. Nuc 03/2013 - normal;  c. 03/2015 TEE EF 15-20%.   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 Dx 12/2012, s/p TEE/DCCV 01/26/13. b. On Xarelto (CHA2DS2VASc = 3);  c. 03/2015 TEE (EF 15-20%, no LAA thrombus) and DCCV - amio increased to 200 mg bid.   Pneumonia    Urinary hesitancy due to benign prostatic hypertrophy     Past Surgical History:  Procedure Laterality Date   ATRIAL FIBRILLATION ABLATION N/A 11/03/2019   Procedure: ATRIAL FIBRILLATION ABLATION;  Surgeon: Thompson Grayer, MD;  Location: [facility];  Service: Cardiovascular;  Laterality: N/A;   BACK SURGERY     upper back, cannot turn neck well   [place] N/A 01/26/2013   Procedure: CARDIOVERSION;  Surgeon: Lelon Perla, MD;  Location: [facility];  Service: Cardiovascular;  Laterality: N/A;   CARDIOVERSION N/A 03/23/2015   Procedure: CARDIOVERSION;  Surgeon: Jerline Pain, MD;  Location: [facility];  Service: Cardiovascular;  Laterality: N/A;   CARDIOVERSION N/A 08/14/2017   Procedure: CARDIOVERSION;  Surgeon: Josue Hector, MD;  Location: [facility];  Service: Cardiovascular;  Laterality: N/A;   CATARACT EXTRACTION Right ~ 2006   COLONOSCOPY WITH PROPOFOL N/A 02/10/2015   Procedure: COLONOSCOPY WITH PROPOFOL;  Surgeon: Gatha Mayer, MD;  [facility];  Service: Endoscopy;  Laterality: N/A;   COLONOSCOPY WITH PROPOFOL N/A 08/07/2016   Procedure: COLONOSCOPY WITH PROPOFOL;  Surgeon: Gatha Mayer, MD;  [facility];  Service: Endoscopy;  Laterality: N/A;   ENTEROSCOPY N/A 08/17/2015   Procedure: ENTEROSCOPY;  Surgeon: Gatha Mayer, MD;  [facility];  Service: Endoscopy;  Laterality: N/A;   EP IMPLANTABLE DEVICE N/A 03/08/2015   MDT Hillery Aldo CRT-D for nonischemic CM by Dr Rayann Heman for primary prevention   GLAUCOMA SURGERY  Right ~ 2006   "put 3 stents in to drain fluid" (03/08/2015) not successful, sent to [facility] to try to get last stent out   HOT HEMOSTASIS N/A 08/07/2016   Procedure: HOT HEMOSTASIS (ARGON PLASMA COAGULATION/BICAP);  Surgeon: Gatha Mayer, MD;  Location: [facility];  Service: Endoscopy;  Laterality: N/A;   INCISION AND DRAINAGE ABSCESS POSTERIOR CERVICALSPINE  05/2012   JOINT REPLACEMENT     MELANOMA EXCISION Left 2015   "lower leg; right at my knee"   REFRACTIVE SURGERY Right ~ 2006 X 2   "twice; both done at [facility]" (03/08/2015   SURGERY SCROTAL / TESTICULAR Right 1990's   TEE WITHOUT CARDIOVERSION N/A 01/26/2013   Procedure: TRANSESOPHAGEAL ECHOCARDIOGRAM (TEE);  Surgeon: Lelon Perla, MD;  Location: [facility];  Service: Cardiovascular;  Laterality: N/A;  [name] anes. /    TEE  WITHOUT CARDIOVERSION N/A 10/05/2014   Procedure: TRANSESOPHAGEAL ECHOCARDIOGRAM (TEE)  with cardioversion;  Surgeon: Thayer Headings, MD;  Location: [facility] END[facility]COPY;  Service: Cardiovascular;  Laterality: N/A;  12:52 synched cardioversion at 120 joules,...afib to SR...12 lead EKG ordered.Marland KitchenMarland KitchenCardiozem d/c'ed per MD verbal order at SR   TEE WITHOUT CARDIOVERSION N/A 03/23/2015   Procedure: TRANSESOPHAGEAL ECHOCARDIOGRAM (TEE);  Surgeon: Jerline Pain, MD;  Location: [facility];  Service: Cardiovascular;  Laterality: N/A;   TEE WITHOUT CARDIOVERSION N/A 11/02/2019   Procedure: TRANSESOPHAGEAL ECHOCARDIOGRAM (TEE);  Surgeon: Josue Hector, MD;  Location: [facility] ENDOSCOPY;  Service: Cardiovascular;  Laterality: N/A;   THORACIC SPINE SURGERY  03/2000   "ground calcium deposits from upper thoracic" (01/26/2013)   TOTAL HIP ARTHROPLASTY Right 06/2007    MEDICATIONS:  calcitRIOL (ROCALTROL) 0.25 MCG capsule   carvedilol (COREG) 12.5 MG tablet   dorzolamide-timolol (COSOPT) 22.3-6.8 MG/ML ophthalmic solution   empagliflozin (JARDIANCE) 10 MG TABS tablet   ENTRESTO 24-26 MG   escitalopram (LEXAPRO) 20 MG tablet   ferrous sulfate 325  (65 FE) MG tablet   finasteride (PROSCAR) 5 MG tablet   furosemide (LASIX) 40 MG tablet   gabapentin (NEURONTIN) 100 MG capsule   latanoprost (XALATAN) 0.005 % ophthalmic solution   Netarsudil Dimesylate 0.02 % SOLN   nitroGLYCERIN (NITROSTAT) 0.4 MG SL tablet   Polyethyl Glycol-Propyl Glycol 0.4-0.3 % SOLN   REPATHA SURECLICK 712 MG/ML SOAJ   Rivaroxaban (XARELTO) 15 MG TABS tablet   spironolactone (ALDACTONE) 25 MG tablet   tamsulosin (FLOMAX) 0.4 MG CAPS   VITAMIN D PO   No current facility-administered medications for this encounter.     Konrad Felix, PA-C [facility] [phone]

## 2021-05-03 NOTE — Anesthesia Preprocedure Evaluation (Addendum)
Anesthesia Evaluation  Patient identified by MRN, date of birth, ID band Patient awake    Reviewed: Allergy & Precautions, NPO status , Patient's Chart, lab work & pertinent test results  Airway Mallampati: II  TM Distance: >3 FB Neck ROM: Full    Dental  (+) Dental Advisory Given, Edentulous Upper, Edentulous Lower   Pulmonary sleep apnea , pneumonia, former smoker,    Pulmonary exam normal breath sounds clear to auscultation       Cardiovascular hypertension, Pt. on home beta blockers and Pt. on medications + CAD, + Past MI, + Peripheral Vascular Disease and +CHF  + dysrhythmias + Cardiac Defibrillator  Rhythm:Regular Rate:Normal  Echo 03/2021 1. Left ventricular ejection fraction, by estimation, is 40 to 45%. The left ventricle has mildly decreased function. The left ventricle demonstrates global hypokinesis. The left ventricular internal cavity size was moderately dilated. There is mild concentric left ventricular hypertrophy. Left ventricular diastolic parameters are consistent with Grade III diastolic dysfunction (restrictive).  2. Right ventricular systolic function is normal. The right ventricular size is normal. There is mildly elevated pulmonary artery systolic pressure.  3. Left atrial size was moderately dilated.  4. Right atrial size was moderately dilated.  5. The mitral valve is normal in structure. Trivial mitral valve regurgitation. No evidence of mitral stenosis.  6. The aortic valve is tricuspid. Aortic valve regurgitation is trivial.     DDDR, 23 % AS-VP, 78% AP-VP ~95%  VP, 75-80% AP    Neuro/Psych PSYCHIATRIC DISORDERS Depression  Neuromuscular disease    GI/Hepatic Neg liver ROS, GERD  ,  Endo/Other  negative endocrine ROS  Renal/GU negative Renal ROS     Musculoskeletal  (+) Arthritis ,   Abdominal   Peds  Hematology negative hematology ROS (+) anemia ,   Anesthesia Other Findings    Reproductive/Obstetrics                                                            Anesthesia Evaluation  Patient identified by MRN, date of birth, ID band Patient awake    Reviewed: Allergy & Precautions, NPO status , Patient's Chart, lab work & pertinent test results, reviewed documented beta blocker date and time   Airway Mallampati: II  TM Distance: >3 FB Neck ROM: Full    Dental  (+) Edentulous Upper, Edentulous Lower, Dental Advisory Given   Pulmonary sleep apnea and Continuous Positive Airway Pressure Ventilation , former smoker,    Pulmonary exam normal breath sounds clear to auscultation       Cardiovascular hypertension, Pt. on medications and Pt. on home beta blockers + Past MI, + Peripheral Vascular Disease and +CHF  + dysrhythmias Atrial Fibrillation + Cardiac Defibrillator  Rhythm:Irregular Rate:Normal  Echo 11/02/2019 1. Left ventricular ejection fraction, by visual estimation, is 35 to 40%. The left ventricle has moderately decreased function. There is no left ventricular hypertrophy.  2. Mildly dilated left ventricular internal cavity size.  3. The left ventricle demonstrates global hypokinesis.  4. Diffuse hypokinesis worse in the inferior wall.  5. Global right ventricle has normal systolic function.The right ventricular size is normal. No increase in right ventricular wall thickness.  6. Pacing wires in RA/RV.  7. Left atrial size was moderately dilated.  8. Smoke in LAA but no LAA thrombus [facility]  to proceed with ablation in am.  9. Right atrial size was mildly dilated.  10. Moderate pericardial effusion.  11. The pericardial effusion is Lateral to RV free wall.  12. The mitral valve is degenerative. Mild to moderate mitral valve regurgitation.  13. Some prolapse to anterior leaflet. MR orifice located on A1/P! segments on 3 D imaging.  14. The tricuspid valve is normal in structure.  15. The tricuspid valve is normal  in structure. Tricuspid valve regurgitation is moderate.  16. The aortic valve is tricuspid. Aortic valve regurgitation is trivial. Mild to moderate aortic valve sclerosis/calcification without any evidence of aortic stenosis.  17. The pulmonic valve was grossly normal. Pulmonic valve regurgitation is mild.  18. Mildly elevated pulmonary artery systolic pressure.    Pacemaker ~98% AP-VP   Neuro/Psych PSYCHIATRIC DISORDERS Depression negative neurological ROS     GI/Hepatic negative GI ROS, Neg liver ROS,   Endo/Other  Hyperlipidemia  Renal/GU negative Renal ROS  negative genitourinary   Musculoskeletal  (+) Arthritis , Osteoarthritis,    Abdominal (+) + obese,   Peds  Hematology  (+) anemia , Thrombocytopenia Xarelto- last dose this am   Anesthesia Other Findings   Reproductive/Obstetrics                           Anesthesia Physical  Anesthesia Plan  ASA: IV  Anesthesia Plan: General   Post-op Pain Management:    Induction: Intravenous  PONV Risk Score and Plan: 2 and Propofol infusion, Ondansetron and Treatment may vary due to age or medical condition  Airway Management Planned: Oral ETT  Additional Equipment: None  Intra-op Plan:   Post-operative Plan: Extubation in OR  Informed Consent: I have reviewed the patients History and Physical, chart, labs and discussed the procedure including the risks, benefits and alternatives for the proposed anesthesia with the patient or authorized representative who has indicated his/her understanding and acceptance.     Dental advisory given  Plan Discussed with: CRNA  Anesthesia Plan Comments:         Anesthesia Quick Evaluation  Anesthesia Physical Anesthesia Plan  ASA: 4  Anesthesia Plan: General   Post-op Pain Management:    Induction: Intravenous  PONV Risk Score and Plan: 4 or greater and Ondansetron, Dexamethasone, Treatment may vary due to age or medical condition  and Diphenhydramine  Airway Management Planned: Oral ETT  Additional Equipment:   Intra-op Plan:   Post-operative Plan: Extubation in OR  Informed Consent: I have reviewed the patients History and Physical, chart, labs and discussed the procedure including the risks, benefits and alternatives for the proposed anesthesia with the patient or authorized representative who has indicated his/her understanding and acceptance.     Dental advisory given  Plan Discussed with: CRNA  Anesthesia Plan Comments: (Device reprogramming needed in preop. Asynchronous mode required. Patient is ~75-80% AP and 95% biVP.    See PAT note 05/01/2021, Konrad Felix, PA-C)     Anesthesia Quick Evaluation

## 2021-05-05 ENCOUNTER — Encounter (HOSPITAL_COMMUNITY): Payer: Self-pay | Admitting: Surgery

## 2021-05-05 DIAGNOSIS — D440 Neoplasm of uncertain behavior of thyroid gland: Secondary | ICD-10-CM | POA: Diagnosis present

## 2021-05-05 DIAGNOSIS — E042 Nontoxic multinodular goiter: Secondary | ICD-10-CM | POA: Diagnosis present
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
## 2021-05-08 ENCOUNTER — Ambulatory Visit (INDEPENDENT_AMBULATORY_CARE_PROVIDER_SITE_OTHER): Payer: Medicare PPO

## 2021-05-08 ENCOUNTER — Other Ambulatory Visit: Payer: Self-pay | Admitting: Surgery

## 2021-05-08 DIAGNOSIS — Z95810 Presence of automatic (implantable) cardiac defibrillator: Secondary | ICD-10-CM

## 2021-05-08 DIAGNOSIS — I5022 Chronic systolic (congestive) heart failure: Secondary | ICD-10-CM | POA: Diagnosis not present

## 2021-05-09 LAB — SARS CORONAVIRUS 2 (TAT 6-24 HRS): SARS Coronavirus 2: NEGATIVE

## 2021-05-10 ENCOUNTER — Telehealth: Payer: Self-pay

## 2021-05-10 NOTE — Telephone Encounter (Signed)
Remote ICM transmission received.  Attempted call to patient regarding ICM remote transmission and left detailed message per DPR.  Advised to return call for any fluid symptoms or questions. Next ICM remote transmission scheduled 06/12/2021.

## 2021-05-10 NOTE — Progress Notes (Signed)
EPIC Encounter for ICM Monitoring  Patient Name: Chad Brown is a 80 y.o. male Date: 05/10/2021 Primary Care Physican: Ginger Organ., MD Primary Cardiologist: Nishan/McLean Electrophysiologist: Allred Bi-V Pacing: 95.9%   04/05/2021 Weight: 209-211 lbs (baseline)   Time in AT/AF   0.0 hr/day (0.0%) (taking Xarelto)   Attempted call to patient and unable to reach.  Left detailed message per DPR regarding transmission. Transmission reviewed.  Scheduled for total thyroidectomy 05/11/2021   Optivol thoracic impedance normal.   Prescribed:  Furosemide 40 mg 1 tablet (40 mg total) by mouth in the morning and 0.5 tablet (20 mg total)  every evening. Spironolactone 25 mg Take 0.5 tablet (12.5 mg total) by mouth daily. Take at bedtime   Labs: 05/01/2021 Creatinine 1.43, BUN 24, Potassium 4.6, Sodium 142, GFR 50 03/09/2021 Creatinine 1.40, BUN 18, Potassium 3.7, Sodium 139, GFR 51 02/17/2021 Creatinine 1.64, BUN 17, Potassium 3.9, Sodium 139, GFR 42 02/07/2021 Creatinine 1.43, BUN 14,Potassium 3.8, Sodium 141,  GFR 50  10/27/2020 Creatinine 1.34, BUN 17, Potassium 3.3, Sodium 137, GFR 54 A complete set of results can be found in Results Review.   Recommendations:  Left voice mail with ICM number and encouraged to call if experiencing any fluid symptoms.   Follow-up plan: ICM clinic phone appointment on 06/12/2021.  91 day device clinic remote transmission 06/01/2021.     EP/Cardiology Next Office Visit:  05/17/2021 with Dr Aundra Dubin.  Recall 04/10/2021 with Tommye Standard, PA.   Recall 10/07/2021 with Dr Rayann Heman     Copy of ICM check sent to Dr. Rayann Heman.   3 month ICM trend: 05/08/2021.    1 Year ICM trend:       Rosalene Billings, RN 05/10/2021 10:18 AM

## 2021-05-10 NOTE — Progress Notes (Signed)
Called patient about time change for surgery on 05/11/21. He is to arrive 0745 for 0945 surgery. He verbalizes understanding.

## 2021-05-11 ENCOUNTER — Encounter (HOSPITAL_COMMUNITY): Payer: Self-pay | Admitting: Surgery

## 2021-05-11 ENCOUNTER — Ambulatory Visit (HOSPITAL_COMMUNITY): Payer: Medicare PPO | Admitting: Anesthesiology

## 2021-05-11 ENCOUNTER — Ambulatory Visit (HOSPITAL_COMMUNITY): Payer: Medicare PPO | Admitting: Physician Assistant

## 2021-05-11 ENCOUNTER — Other Ambulatory Visit: Payer: Self-pay

## 2021-05-11 ENCOUNTER — Ambulatory Visit (HOSPITAL_COMMUNITY)
Admission: RE | Admit: 2021-05-11 | Discharge: 2021-05-12 | Disposition: A | Discharge status: Home / Self Care | Payer: Medicare PPO | Attending: Surgery | Admitting: Surgery

## 2021-05-11 ENCOUNTER — Encounter (HOSPITAL_COMMUNITY)
Admission: RE | Disposition: A | Discharge status: Home / Self Care | Payer: Self-pay | Source: Home / Self Care | Attending: Surgery

## 2021-05-11 DIAGNOSIS — Z7982 Long term (current) use of aspirin: Secondary | ICD-10-CM | POA: Diagnosis not present

## 2021-05-11 DIAGNOSIS — Z981 Arthrodesis status: Secondary | ICD-10-CM | POA: Insufficient documentation

## 2021-05-11 DIAGNOSIS — C73 Malignant neoplasm of thyroid gland: Secondary | ICD-10-CM | POA: Insufficient documentation

## 2021-05-11 DIAGNOSIS — D440 Neoplasm of uncertain behavior of thyroid gland: Secondary | ICD-10-CM | POA: Diagnosis not present

## 2021-05-11 DIAGNOSIS — Z96641 Presence of right artificial hip joint: Secondary | ICD-10-CM | POA: Insufficient documentation

## 2021-05-11 DIAGNOSIS — Z808 Family history of malignant neoplasm of other organs or systems: Secondary | ICD-10-CM | POA: Insufficient documentation

## 2021-05-11 DIAGNOSIS — Z881 Allergy status to other antibiotic agents status: Secondary | ICD-10-CM | POA: Diagnosis not present

## 2021-05-11 DIAGNOSIS — I4891 Unspecified atrial fibrillation: Secondary | ICD-10-CM | POA: Diagnosis not present

## 2021-05-11 DIAGNOSIS — Z79899 Other long term (current) drug therapy: Secondary | ICD-10-CM | POA: Insufficient documentation

## 2021-05-11 DIAGNOSIS — Z888 Allergy status to other drugs, medicaments and biological substances status: Secondary | ICD-10-CM | POA: Insufficient documentation

## 2021-05-11 DIAGNOSIS — Z7901 Long term (current) use of anticoagulants: Secondary | ICD-10-CM | POA: Insufficient documentation

## 2021-05-11 DIAGNOSIS — Z791 Long term (current) use of non-steroidal anti-inflammatories (NSAID): Secondary | ICD-10-CM | POA: Diagnosis not present

## 2021-05-11 DIAGNOSIS — R739 Hyperglycemia, unspecified: Secondary | ICD-10-CM | POA: Insufficient documentation

## 2021-05-11 DIAGNOSIS — E042 Nontoxic multinodular goiter: Secondary | ICD-10-CM | POA: Diagnosis present

## 2021-05-11 DIAGNOSIS — E041 Nontoxic single thyroid nodule: Secondary | ICD-10-CM | POA: Diagnosis present

## 2021-05-11 DIAGNOSIS — Z87891 Personal history of nicotine dependence: Secondary | ICD-10-CM | POA: Diagnosis not present

## 2021-05-11 DIAGNOSIS — Z6834 Body mass index (BMI) 34.0-34.9, adult: Secondary | ICD-10-CM | POA: Insufficient documentation

## 2021-05-11 DIAGNOSIS — E669 Obesity, unspecified: Secondary | ICD-10-CM | POA: Insufficient documentation

## 2021-05-11 DIAGNOSIS — R918 Other nonspecific abnormal finding of lung field: Secondary | ICD-10-CM | POA: Diagnosis not present

## 2021-05-11 DIAGNOSIS — Z95810 Presence of automatic (implantable) cardiac defibrillator: Secondary | ICD-10-CM | POA: Diagnosis not present

## 2021-05-11 DIAGNOSIS — Z7984 Long term (current) use of oral hypoglycemic drugs: Secondary | ICD-10-CM | POA: Insufficient documentation

## 2021-05-11 DIAGNOSIS — Z85820 Personal history of malignant melanoma of skin: Secondary | ICD-10-CM | POA: Diagnosis not present

## 2021-05-11 DIAGNOSIS — I110 Hypertensive heart disease with heart failure: Secondary | ICD-10-CM | POA: Diagnosis not present

## 2021-05-11 DIAGNOSIS — I5023 Acute on chronic systolic (congestive) heart failure: Secondary | ICD-10-CM | POA: Diagnosis not present

## 2021-05-11 HISTORY — PX: THYROIDECTOMY: SHX17

## 2021-05-11 LAB — GLUCOSE, CAPILLARY
Glucose-Capillary: 98 mg/dL (ref 70–99)
Glucose-Capillary: 169 mg/dL — ABNORMAL HIGH (ref 70–99)
Glucose-Capillary: 171 mg/dL — ABNORMAL HIGH (ref 70–99)

## 2021-05-11 SURGERY — THYROIDECTOMY
Anesthesia: General | Site: Neck

## 2021-05-11 MED ORDER — FENTANYL CITRATE (PF) 250 MCG/5ML IJ SOLN
INTRAMUSCULAR | Status: DC | PRN
  Administered 2021-05-11 (×2): 25 ug via INTRAVENOUS
  Administered 2021-05-11: 100 ug via INTRAVENOUS
  Administered 2021-05-11: 25 ug via INTRAVENOUS
  Administered 2021-05-11: 50 ug via INTRAVENOUS
  Administered 2021-05-11: 25 ug via INTRAVENOUS

## 2021-05-11 MED ORDER — FENTANYL CITRATE (PF) 100 MCG/2ML IJ SOLN
INTRAMUSCULAR | Status: AC
  Administered 2021-05-11: 25 ug via INTRAVENOUS
  Filled 2021-05-11: qty 2

## 2021-05-11 MED ORDER — CHLORHEXIDINE GLUCONATE 0.12 % MT SOLN
15.0000 mL | Freq: Once | OROMUCOSAL | Status: AC
  Administered 2021-05-11: 15 mL via OROMUCOSAL

## 2021-05-11 MED ORDER — TRAMADOL HCL 50 MG PO TABS
50.0000 mg | ORAL_TABLET | Freq: Four times a day (QID) | ORAL | Status: DC | PRN
  Administered 2021-05-11 – 2021-05-12 (×2): 50 mg via ORAL
  Filled 2021-05-11 (×2): qty 1

## 2021-05-11 MED ORDER — OXYCODONE HCL 5 MG PO TABS
5.0000 mg | ORAL_TABLET | ORAL | Status: DC | PRN
  Administered 2021-05-11 (×2): 10 mg via ORAL
  Filled 2021-05-11 (×2): qty 2

## 2021-05-11 MED ORDER — SPIRONOLACTONE 25 MG PO TABS
25.0000 mg | ORAL_TABLET | Freq: Every day | ORAL | Status: DC
  Administered 2021-05-12: 25 mg via ORAL
  Filled 2021-05-11 (×2): qty 1

## 2021-05-11 MED ORDER — EMPAGLIFLOZIN 10 MG PO TABS
10.0000 mg | ORAL_TABLET | Freq: Every day | ORAL | Status: DC
  Administered 2021-05-12: 10 mg via ORAL
  Filled 2021-05-11: qty 1

## 2021-05-11 MED ORDER — PHENYLEPHRINE HCL (PRESSORS) 10 MG/ML IV SOLN
INTRAVENOUS | Status: DC | PRN
  Administered 2021-05-11: 80 ug via INTRAVENOUS
  Administered 2021-05-11 (×3): 40 ug via INTRAVENOUS

## 2021-05-11 MED ORDER — ORAL CARE MOUTH RINSE: 15.0000 mL | Freq: Once | OROMUCOSAL | Status: AC

## 2021-05-11 MED ORDER — HYDROMORPHONE HCL 1 MG/ML IJ SOLN: 1.0000 mg | INTRAMUSCULAR | Status: DC | PRN

## 2021-05-11 MED ORDER — PHENYLEPHRINE 40 MCG/ML (10ML) SYRINGE FOR IV PUSH (FOR BLOOD PRESSURE SUPPORT)
PREFILLED_SYRINGE | INTRAVENOUS | Status: AC
  Filled 2021-05-11: qty 10

## 2021-05-11 MED ORDER — SUGAMMADEX SODIUM 200 MG/2ML IV SOLN
INTRAVENOUS | Status: DC | PRN
  Administered 2021-05-11: 200 mg via INTRAVENOUS

## 2021-05-11 MED ORDER — PROMETHAZINE HCL 25 MG/ML IJ SOLN: 6.2500 mg | INTRAMUSCULAR | Status: DC | PRN

## 2021-05-11 MED ORDER — DEXAMETHASONE SODIUM PHOSPHATE 10 MG/ML IJ SOLN
INTRAMUSCULAR | Status: AC
  Filled 2021-05-11: qty 1

## 2021-05-11 MED ORDER — PROPOFOL 10 MG/ML IV BOLUS
INTRAVENOUS | Status: DC | PRN
  Administered 2021-05-11: 100 mg via INTRAVENOUS

## 2021-05-11 MED ORDER — LIDOCAINE 2% (20 MG/ML) 5 ML SYRINGE
INTRAMUSCULAR | Status: DC | PRN
  Administered 2021-05-11: 60 mg via INTRAVENOUS

## 2021-05-11 MED ORDER — LIDOCAINE 2% (20 MG/ML) 5 ML SYRINGE
INTRAMUSCULAR | Status: AC
  Filled 2021-05-11: qty 5

## 2021-05-11 MED ORDER — DIPHENHYDRAMINE HCL 50 MG/ML IJ SOLN
INTRAMUSCULAR | Status: DC | PRN
  Administered 2021-05-11: 12.5 mg via INTRAVENOUS

## 2021-05-11 MED ORDER — ACETAMINOPHEN 325 MG PO TABS
650.0000 mg | ORAL_TABLET | Freq: Four times a day (QID) | ORAL | Status: DC | PRN
  Administered 2021-05-12: 650 mg via ORAL
  Filled 2021-05-11: qty 2

## 2021-05-11 MED ORDER — CHLORHEXIDINE GLUCONATE CLOTH 2 % EX PADS: 6.0000 | MEDICATED_PAD | Freq: Once | CUTANEOUS | Status: DC

## 2021-05-11 MED ORDER — INSULIN ASPART 100 UNIT/ML IJ SOLN
0.0000 [IU] | Freq: Three times a day (TID) | INTRAMUSCULAR | Status: DC
Start: 2021-05-11 — End: 2021-05-12
  Administered 2021-05-11: 3 [IU] via SUBCUTANEOUS
  Administered 2021-05-12: 2 [IU] via SUBCUTANEOUS

## 2021-05-11 MED ORDER — DEXAMETHASONE SODIUM PHOSPHATE 10 MG/ML IJ SOLN
INTRAMUSCULAR | Status: DC | PRN
  Administered 2021-05-11: 10 mg via INTRAVENOUS

## 2021-05-11 MED ORDER — HEMOSTATIC AGENTS (NO CHARGE) OPTIME
TOPICAL | Status: DC | PRN
  Administered 2021-05-11: 1 via TOPICAL

## 2021-05-11 MED ORDER — ROCURONIUM BROMIDE 10 MG/ML (PF) SYRINGE
PREFILLED_SYRINGE | INTRAVENOUS | Status: DC | PRN
  Administered 2021-05-11: 50 mg via INTRAVENOUS
  Administered 2021-05-11 (×2): 10 mg via INTRAVENOUS

## 2021-05-11 MED ORDER — TAMSULOSIN HCL 0.4 MG PO CAPS
0.4000 mg | ORAL_CAPSULE | Freq: Every day | ORAL | Status: DC
  Administered 2021-05-11: 0.4 mg via ORAL
  Filled 2021-05-11: qty 1

## 2021-05-11 MED ORDER — SACUBITRIL-VALSARTAN 24-26 MG PO TABS
0.5000 | ORAL_TABLET | Freq: Two times a day (BID) | ORAL | Status: DC
  Administered 2021-05-11 – 2021-05-12 (×2): 0.5 via ORAL
  Filled 2021-05-11 (×2): qty 0.5

## 2021-05-11 MED ORDER — ONDANSETRON HCL 4 MG/2ML IJ SOLN
INTRAMUSCULAR | Status: AC
  Filled 2021-05-11: qty 2

## 2021-05-11 MED ORDER — CARVEDILOL 12.5 MG PO TABS
12.5000 mg | ORAL_TABLET | Freq: Two times a day (BID) | ORAL | Status: DC
  Administered 2021-05-11 – 2021-05-12 (×2): 12.5 mg via ORAL
  Filled 2021-05-11 (×2): qty 1

## 2021-05-11 MED ORDER — FENTANYL CITRATE (PF) 250 MCG/5ML IJ SOLN
INTRAMUSCULAR | Status: AC
  Filled 2021-05-11: qty 5

## 2021-05-11 MED ORDER — ONDANSETRON HCL 4 MG/2ML IJ SOLN
INTRAMUSCULAR | Status: DC | PRN
Start: 2021-05-11 — End: 2021-05-11
  Administered 2021-05-11: 4 mg via INTRAVENOUS

## 2021-05-11 MED ORDER — FINASTERIDE 5 MG PO TABS
5.0000 mg | ORAL_TABLET | Freq: Every day | ORAL | Status: DC
  Administered 2021-05-12: 5 mg via ORAL
  Filled 2021-05-11: qty 1

## 2021-05-11 MED ORDER — FENTANYL CITRATE (PF) 100 MCG/2ML IJ SOLN
25.0000 ug | INTRAMUSCULAR | Status: DC | PRN
  Administered 2021-05-11: 50 ug via INTRAVENOUS

## 2021-05-11 MED ORDER — 0.9 % SODIUM CHLORIDE (POUR BTL) OPTIME
TOPICAL | Status: DC | PRN
  Administered 2021-05-11: 1000 mL

## 2021-05-11 MED ORDER — SODIUM CHLORIDE 0.45 % IV SOLN: INTRAVENOUS | Status: DC

## 2021-05-11 MED ORDER — ONDANSETRON HCL 4 MG/2ML IJ SOLN: 4.0000 mg | Freq: Four times a day (QID) | INTRAMUSCULAR | Status: DC | PRN

## 2021-05-11 MED ORDER — DIPHENHYDRAMINE HCL 50 MG/ML IJ SOLN
INTRAMUSCULAR | Status: AC
  Filled 2021-05-11: qty 1

## 2021-05-11 MED ORDER — CALCIUM CARBONATE 1250 (500 CA) MG PO TABS
2.0000 | ORAL_TABLET | Freq: Three times a day (TID) | ORAL | Status: DC
  Administered 2021-05-11 – 2021-05-12 (×3): 1000 mg via ORAL
  Filled 2021-05-11 (×3): qty 1

## 2021-05-11 MED ORDER — FUROSEMIDE 20 MG PO TABS
20.0000 mg | ORAL_TABLET | Freq: Two times a day (BID) | ORAL | Status: DC
  Administered 2021-05-11 – 2021-05-12 (×2): 20 mg via ORAL
  Filled 2021-05-11 (×2): qty 1

## 2021-05-11 MED ORDER — LATANOPROST 0.005 % OP SOLN
1.0000 [drp] | Freq: Every day | OPHTHALMIC | Status: DC
  Administered 2021-05-11: 1 [drp] via OPHTHALMIC
  Filled 2021-05-11: qty 2.5

## 2021-05-11 MED ORDER — DORZOLAMIDE HCL-TIMOLOL MAL 2-0.5 % OP SOLN
1.0000 [drp] | Freq: Two times a day (BID) | OPHTHALMIC | Status: DC
  Administered 2021-05-11 – 2021-05-12 (×2): 1 [drp] via OPHTHALMIC
  Filled 2021-05-11: qty 10

## 2021-05-11 MED ORDER — CEFAZOLIN SODIUM-DEXTROSE 2-4 GM/100ML-% IV SOLN
2.0000 g | INTRAVENOUS | Status: AC
  Administered 2021-05-11: 2 g via INTRAVENOUS
  Filled 2021-05-11: qty 100

## 2021-05-11 MED ORDER — LACTATED RINGERS IV SOLN: INTRAVENOUS | Status: DC

## 2021-05-11 MED ORDER — ACETAMINOPHEN 650 MG RE SUPP: 650.0000 mg | Freq: Four times a day (QID) | RECTAL | Status: DC | PRN

## 2021-05-11 MED ORDER — GABAPENTIN 100 MG PO CAPS
200.0000 mg | ORAL_CAPSULE | Freq: Two times a day (BID) | ORAL | Status: DC
  Administered 2021-05-11 – 2021-05-12 (×2): 200 mg via ORAL
  Filled 2021-05-11 (×2): qty 2

## 2021-05-11 MED ORDER — ESCITALOPRAM OXALATE 20 MG PO TABS
20.0000 mg | ORAL_TABLET | Freq: Every day | ORAL | Status: DC
  Administered 2021-05-12: 20 mg via ORAL
  Filled 2021-05-11: qty 1

## 2021-05-11 MED ORDER — ONDANSETRON 4 MG PO TBDP: 4.0000 mg | ORAL_TABLET | Freq: Four times a day (QID) | ORAL | Status: DC | PRN

## 2021-05-11 MED ORDER — ROCURONIUM BROMIDE 10 MG/ML (PF) SYRINGE
PREFILLED_SYRINGE | INTRAVENOUS | Status: AC
  Filled 2021-05-11: qty 10

## 2021-05-11 MED ORDER — PROPOFOL 10 MG/ML IV BOLUS
INTRAVENOUS | Status: AC
  Filled 2021-05-11: qty 20

## 2021-05-11 SURGICAL SUPPLY — 30 items
ATTRACTOMAT 16X20 MAGNETIC DRP (DRAPES) ×2 IMPLANT
BAG COUNTER SPONGE SURGICOUNT (BAG) ×2 IMPLANT
BLADE SURG 15 STRL LF DISP TIS (BLADE) ×1 IMPLANT
BLADE SURG 15 STRL SS (BLADE) ×2
CHLORAPREP W/TINT 26 (MISCELLANEOUS) ×2 IMPLANT
CLIP TI MEDIUM 6 (CLIP) ×6 IMPLANT
CLIP TI WIDE RED SMALL 6 (CLIP) ×14 IMPLANT
COVER SURGICAL LIGHT HANDLE (MISCELLANEOUS) ×2 IMPLANT
DERMABOND ADVANCED (GAUZE/BANDAGES/DRESSINGS) ×1
DERMABOND ADVANCED .7 DNX12 (GAUZE/BANDAGES/DRESSINGS) ×1 IMPLANT
DRAPE LAPAROTOMY T 98X78 PEDS (DRAPES) ×2 IMPLANT
DRAPE UTILITY XL STRL (DRAPES) ×2 IMPLANT
ELECT PENCIL ROCKER SW 15FT (MISCELLANEOUS) ×2 IMPLANT
ELECT REM PT RETURN 15FT ADLT (MISCELLANEOUS) ×2 IMPLANT
GAUZE 4X4 16PLY ~~LOC~~+RFID DBL (SPONGE) ×2 IMPLANT
GLOVE SURG SYN 7.5  E (GLOVE) ×4
GLOVE SURG SYN 7.5 E (GLOVE) ×2 IMPLANT
GOWN STRL REUS W/TWL XL LVL3 (GOWN DISPOSABLE) ×4 IMPLANT
HEMOSTAT SURGICEL 2X4 FIBR (HEMOSTASIS) ×2 IMPLANT
ILLUMINATOR WAVEGUIDE N/F (MISCELLANEOUS) ×2 IMPLANT
KIT BASIN OR (CUSTOM PROCEDURE TRAY) ×2 IMPLANT
KIT TURNOVER KIT A (KITS) ×2 IMPLANT
PACK BASIC VI WITH GOWN DISP (CUSTOM PROCEDURE TRAY) ×2 IMPLANT
SHEARS HARMONIC 9CM CVD (BLADE) ×2 IMPLANT
SUT MNCRL AB 4-0 PS2 18 (SUTURE) ×2 IMPLANT
SUT VIC AB 3-0 SH 18 (SUTURE) ×6 IMPLANT
SYR BULB IRRIG 60ML STRL (SYRINGE) ×2 IMPLANT
TOWEL OR 17X26 10 PK STRL BLUE (TOWEL DISPOSABLE) ×2 IMPLANT
TOWEL OR NON WOVEN STRL DISP B (DISPOSABLE) ×2 IMPLANT
TUBING CONNECTING 10 (TUBING) ×2 IMPLANT

## 2021-05-11 NOTE — Anesthesia Postprocedure Evaluation (Signed)
Anesthesia Post Note  Patient: Chad Brown  Procedure(s) Performed: TOTAL THYROIDECTOMY (Neck)     Patient location during evaluation: PACU Anesthesia Type: General Level of consciousness: sedated and patient cooperative Pain management: pain level controlled Vital Signs Assessment: post-procedure vital signs reviewed and stable Respiratory status: spontaneous breathing Cardiovascular status: stable Anesthetic complications: no Comments: [employer] @bedside  ~1300 reprogramming AICD to baseline    No notable events documented.  Last Vitals:  Vitals:   05/11/21 1526 05/11/21 1631  BP: (!) 181/95 (!) 151/88  Pulse: 73 68  Resp: 18 17  Temp: 36.9 C 36.6 C  SpO2: 99% 99%    Last Pain:  Vitals:   05/11/21 1631  TempSrc: Oral  PainSc:                  Nolon Nations

## 2021-05-11 NOTE — Op Note (Signed)
Operative Note  Pre-operative Diagnosis:  Thyroid neoplasm of uncertain behavior, Bethesda Category V; multiple thyroid nodules  Post-operative Diagnosis:  same  Surgeon:  Armandina Gemma, MD  Assistant:  none   Procedure:  total thyroidectomy  Anesthesia:  general  Estimated Blood Loss:  minimal  Drains: none         Specimen: to pathology  Indications:  Patient is referred by Dr. Loanne Drilling for evaluation for thyroid resection for neoplasm of uncertain behavior of the thyroid gland.  Patient's primary care physician is Dr. Carmie Kanner.  Patient was noted as an incidental finding on CT scan in March 2022 to have thyroid nodules.  Patient underwent an ultrasound of the thyroid on January 27, 2021.  This showed a normal-sized thyroid gland with bilateral nodules.  The dominant nodule on the right measured 2.5 cm and was felt to be moderately suspicious.  Fine-needle aspiration biopsy performed on Feb 08, 2021 showed cytologic features suspicious for malignancy, Bethesda category V.  The patient now comes to surgery for total thyroidectomy for definitive diagnosis and management.  Procedure:  The patient was seen in the pre-op holding area. The risks, benefits, complications, treatment options, and expected outcomes were previously discussed with the patient. The patient agreed with the proposed plan and has signed the informed consent form.  The patient was brought to the operating room by the surgical team, identified as Chad Brown and the procedure verified. A "time out" was completed and the above information confirmed.  Following induction of general anesthesia, the patient is positioned and then prepped and draped in the usual aseptic fashion.  Due to the patient's previous spine fusion, the neck is essentially immobile.  Patient is optimally positioned.  Following the timeout, and after ascertaining that an adequate level of anesthesia been achieved, a small Kocher incision is  made with a #15 blade.  Dissection was carried through subcutaneous tissues and hemostasis achieved with the electrocautery.  Skin flaps are elevated cephalad and caudad.  External jugular veins are suture-ligated with 3-0 Vicryl suture ligatures.  Weitlander retractors are placed for exposure.  Strap muscles are incised in the midline.  Unfortunately, due to the body habitus, the approach is at the very superior aspect of the thyroid gland.  Strap muscles are reflected laterally.  Some of the strap muscle insertion is taken down with the harmonic scalpel.  The superior pole was visualized.  Dissection is begun on the left side.  Superior pole vessels are dissected out individually and divided between small and medium ligaclips with the harmonic scalpel.  Using gentle dissection the left thyroid lobe is exposed proceeding from superior to inferior.  Middle thyroid vein is divided between ligaclips with the harmonic scalpel.  Left lobe is multinodular but overall normal in size.  The superior pole was completely mobilized and the anterior portion is mobilized down to the isthmus.  Inferior pole was mobilized.  Venous tributaries are divided between small and medium ligaclips.  Gland is rolled anteriorly.  Branches of the inferior thyroid artery are divided between small ligaclips.  Recurrent nerve is identified and preserved.  Ligament of Gwenlyn Found is released with the electrocautery and the gland is mobilized onto the anterior trachea.  Isthmus is mobilized across the midline and then transected with the harmonic scalpel.  Sutures used to mark the isthmus margin.  The entire left lobe was submitted to pathology for review.  Next we turned our attention to the right lobe.  Again strap muscles are  reflected laterally exposing the superior pole.  Strap muscle insertions is taken down for a centimeter or so with the harmonic scalpel.  Superior pole vessels are dissected out and divided individually between small and  medium ligaclips with the harmonic scalpel.  Palpation of the lobe shows it to be slightly enlarged and multinodular with a firm dominant mass located centrally and posteriorly consistent with malignancy.  There does not appear to be any associated lymphadenopathy.  Gland is gently mobilized.  Venous tributaries are divided between ligaclips with the harmonic scalpel.  Gland is rolled anteriorly and branches of the inferior thyroid artery are divided with the harmonic scalpel in between ligaclips were necessary.  Care is taken to avoid the recurrent nerve.  Ligament of Gwenlyn Found is released with the electrocautery and the gland is rolled onto the anterior trachea from which it is completely excised.  Right lobe is submitted separately to pathology for review.  Neck is irrigated with warm saline.  Good hemostasis is achieved throughout the operative field.  Fibrillar is placed throughout the operative field.  Strap muscles are reapproximated in the midline of interrupted 3-0 Vicryl sutures.  Platysma is closed with interrupted 3-0 Vicryl sutures.  Skin is closed with a running 4-0 Monocryl subcuticular suture.  Wound is washed and dried and Dermabond is applied as dressing.  Patient is awakened from anesthesia and transported to the recovery room.  The patient tolerated the procedure well.   Armandina Gemma, MD [facility] Office: [phone]

## 2021-05-11 NOTE — Interval H&P Note (Signed)
History and Physical Interval Note:  05/11/2021 9:07 AM  Chad Brown  has presented today for surgery, with the diagnosis of THYROID NEOPLASM OF UNCERTAIN BEHAVIOR.  The various methods of treatment have been discussed with the patient and family. After consideration of risks, benefits and other options for treatment, the patient has consented to    Procedure(s): TOTAL THYROIDECTOMY (N/A) as a surgical intervention.    The patient's history has been reviewed, patient examined, no change in status, stable for surgery.  I have reviewed the patient's chart and labs.  Questions were answered to the patient's satisfaction.    Armandina Gemma, [facility] Office: [place]

## 2021-05-11 NOTE — Anesthesia Procedure Notes (Signed)
Procedure Name: Intubation Date/Time: 05/11/2021 9:29 AM Performed by: Sharlette Dense, CRNA Pre-anesthesia Checklist: Patient identified, Emergency Drugs available, Suction available and Patient being monitored Patient Re-evaluated:Patient Re-evaluated prior to induction Oxygen Delivery Method: Circle system utilized Preoxygenation: Pre-oxygenation with 100% oxygen Induction Type: IV induction Ventilation: Mask ventilation without difficulty and Oral airway inserted - appropriate to patient size Laryngoscope Size: Miller and 3 Grade View: Grade I Tube type: Reinforced Tube size: 7.5 mm Number of attempts: 1 Airway Equipment and Method: Stylet and Oral airway Placement Confirmation: ETT inserted through vocal cords under direct vision, positive ETCO2 and breath sounds checked- equal and bilateral Secured at: 22 cm Tube secured with: Tape Dental Injury: Teeth and Oropharynx as per pre-operative assessment

## 2021-05-11 NOTE — Transfer of Care (Signed)
Immediate Anesthesia Transfer of Care Note  Patient: Chad Brown  Procedure(s) Performed: TOTAL THYROIDECTOMY (Neck)  Patient Location: PACU  Anesthesia Type:General  Level of Consciousness: drowsy  Airway & Oxygen Therapy: Patient Spontanous Breathing and Patient connected to face mask oxygen  Post-op Assessment: Report given to RN and Post -op Vital signs reviewed and stable  Post vital signs: Reviewed and stable  Last Vitals:  Vitals Value Taken Time  BP 156/100 05/11/21 1147  Temp    Pulse 69 05/11/21 1149  Resp 14 05/11/21 1149  SpO2 100 % 05/11/21 1149  Vitals shown include unvalidated device data.  Last Pain:  Vitals:   05/11/21 0754  TempSrc:   PainSc: 0-No pain      Patients Stated Pain Goal: 3 (75/73/22 5672)  Complications: No notable events documented.

## 2021-05-12 ENCOUNTER — Encounter (HOSPITAL_COMMUNITY): Payer: Self-pay | Admitting: Surgery

## 2021-05-12 DIAGNOSIS — I4891 Unspecified atrial fibrillation: Secondary | ICD-10-CM | POA: Diagnosis not present

## 2021-05-12 DIAGNOSIS — Z981 Arthrodesis status: Secondary | ICD-10-CM | POA: Diagnosis not present

## 2021-05-12 DIAGNOSIS — Z6834 Body mass index (BMI) 34.0-34.9, adult: Secondary | ICD-10-CM | POA: Diagnosis not present

## 2021-05-12 DIAGNOSIS — Z85820 Personal history of malignant melanoma of skin: Secondary | ICD-10-CM | POA: Diagnosis not present

## 2021-05-12 DIAGNOSIS — C73 Malignant neoplasm of thyroid gland: Secondary | ICD-10-CM | POA: Diagnosis not present

## 2021-05-12 DIAGNOSIS — R739 Hyperglycemia, unspecified: Secondary | ICD-10-CM | POA: Diagnosis not present

## 2021-05-12 DIAGNOSIS — Z7901 Long term (current) use of anticoagulants: Secondary | ICD-10-CM | POA: Diagnosis not present

## 2021-05-12 DIAGNOSIS — E669 Obesity, unspecified: Secondary | ICD-10-CM | POA: Diagnosis not present

## 2021-05-12 DIAGNOSIS — Z808 Family history of malignant neoplasm of other organs or systems: Secondary | ICD-10-CM | POA: Diagnosis not present

## 2021-05-12 LAB — BASIC METABOLIC PANEL
Anion gap: 7 (ref 5–15)
BUN: 23 mg/dL (ref 8–23)
CO2: 27 mmol/L (ref 22–32)
Calcium: 8.5 mg/dL — ABNORMAL LOW (ref 8.9–10.3)
Chloride: 104 mmol/L (ref 98–111)
Creatinine, Ser: 1.45 mg/dL — ABNORMAL HIGH (ref 0.61–1.24)
GFR, Estimated: 49 mL/min — ABNORMAL LOW (ref >60)
Glucose, Bld: 154 mg/dL — ABNORMAL HIGH (ref 70–99)
Potassium: 3.8 mmol/L (ref 3.5–5.1)
Sodium: 138 mmol/L (ref 135–145)

## 2021-05-12 LAB — GLUCOSE, CAPILLARY
Glucose-Capillary: 150 mg/dL — ABNORMAL HIGH (ref 70–99)
Glucose-Capillary: 107 mg/dL — ABNORMAL HIGH (ref 70–99)

## 2021-05-12 MED ORDER — CALCIUM GLUCONATE-NACL 1-0.675 GM/50ML-% IV SOLN
1.0000 g | Freq: Once | INTRAVENOUS | Status: AC
  Administered 2021-05-12: 1000 mg via INTRAVENOUS
  Filled 2021-05-12: qty 50

## 2021-05-12 MED ORDER — LEVOTHYROXINE SODIUM 100 MCG PO TABS: 100.0000 ug | ORAL_TABLET | Freq: Every day | ORAL | 3 refills | Status: DC

## 2021-05-12 MED ORDER — TRAMADOL HCL 50 MG PO TABS: 50.0000 mg | ORAL_TABLET | Freq: Four times a day (QID) | ORAL | 0 refills | Status: DC | PRN

## 2021-05-12 MED ORDER — SODIUM CHLORIDE 0.9 % IV SOLN: INTRAVENOUS | Status: DC | PRN

## 2021-05-12 MED ORDER — CALCIUM CARBONATE ANTACID 500 MG PO CHEW: 2.0000 | CHEWABLE_TABLET | Freq: Three times a day (TID) | ORAL | 1 refills | Status: DC

## 2021-05-12 NOTE — Progress Notes (Signed)
Pt alert and oriented. Tolerating diet. D/C instructions given, pt d/cd home.

## 2021-05-12 NOTE — Plan of Care (Signed)

## 2021-05-12 NOTE — Discharge Instructions (Signed)
[facility]  THYROID & PARATHYROID SURGERY:  POST-OP INSTRUCTIONS  Always review your discharge instruction sheet from the facility where your surgery was performed.  A prescription for pain medication may be given to you upon discharge.  Take your pain medication as prescribed.  If narcotic pain medicine is not needed, then you may take acetaminophen (Tylenol) or ibuprofen (Advil) as needed.  Take your usually prescribed medications unless otherwise directed.  If you need a refill on your pain medication, please contact our office during regular business hours.  Prescriptions cannot be processed by our office after 5 pm or on weekends.  Start with a light diet upon arrival home, such as soup and crackers or toast.  Be sure to drink plenty of fluids daily.  Resume your normal diet the day after surgery.  Most patients will experience some swelling and bruising on the chest and neck area.  Ice packs will help.  Swelling and bruising can take several days to resolve.   It is common to experience some constipation after surgery.  Increasing fluid intake and taking a stool softener (Colace) will usually help or prevent this problem.  A mild laxative (Milk of Magnesia or Miralax) should be taken according to package directions if there has been no bowel movement after 48 hours.  You have steri-strips and a gauze dressing over your incision.  You may remove the gauze bandage on the second day after surgery, and you may shower at that time.  Leave your steri-strips (small skin tapes) in place directly over the incision.  These strips should remain on the skin for 5-7 days and then be removed.  You may get them wet in the shower and pat them dry.  You may resume regular (light) daily activities beginning the next day (such as daily self-care, walking, climbing stairs) gradually increasing activities as tolerated.  You may have sexual intercourse when it is comfortable.  Refrain from  any heavy lifting or straining until approved by your doctor.  You may drive when you no longer are taking prescription pain medication, you can comfortably wear a seatbelt, and you can safely maneuver your car and apply brakes.  You should see your doctor in the office for a follow-up appointment approximately three weeks after your surgery.  Make sure that you call for this appointment within a day or two after you arrive home to insure a convenient appointment time.  WHEN TO CALL YOUR DOCTOR: -- Fever greater than 101.5 -- Inability to urinate -- Nausea and/or vomiting - persistent -- Extreme swelling or bruising -- Continued bleeding from incision -- Increased pain, redness, or drainage from the incision -- Difficulty swallowing or breathing -- Muscle cramping or spasms -- Numbness or tingling in hands or around lips  The clinic staff is available to answer your questions during regular business hours.  Please don't hesitate to call and ask to speak to one of the nurses if you have concerns.  Leo Weyandt, MD [facility] Office: [phone] 

## 2021-05-12 NOTE — Discharge Summary (Signed)
Physician Discharge Summary   Patient ID: Chad Brown MRN: [id number] DOB/AGE: [birth year] 80 y.o.  Admit date: 05/11/2021  Discharge date: 05/12/2021  Discharge Diagnoses:  Principal Problem:   Neoplasm of uncertain behavior of thyroid gland Active Problems:   Right thyroid nodule   Multiple thyroid nodules   Discharged Condition: good  Hospital Course: Patient was admitted for observation following thyroid surgery.  Post op course was uncomplicated.  Pain was well controlled.  Tolerated diet.  Post op calcium level on morning following surgery was 8.5 mg/dl.  Calcium gluconate 1 gm IV was administered prior to discharge home.  Patient was prepared for discharge home on POD#1.   Consults: None  Treatments: surgery: total thyroidectomy  Discharge Exam: Blood pressure (!) 135/91, pulse 74, temperature (!) 97.5 F (36.4 C), temperature source Oral, resp. rate 16, height 5\' 6"  (1.676 m), weight 96.2 kg, SpO2 95 %. HEENT - clear Neck - wound dry and intact; mild ecchymosis; voice normal Chest - clear bilaterally Cor - RRR   Disposition: Home  Discharge Instructions     Diet - low sodium heart healthy   Complete by: As directed    Increase activity slowly   Complete by: As directed    No dressing needed   Complete by: As directed       Allergies as of 05/12/2021       Reactions   Pravastatin Sodium Other (See Comments)   Joint and muscle pain   Azithromycin Diarrhea        Medication List     TAKE these medications    calcitRIOL 0.25 MCG capsule Commonly known as: ROCALTROL Take 0.25 mcg by mouth every Monday, Wednesday, and Friday.   calcium carbonate 500 MG chewable tablet Commonly known as: Tums Chew 2 tablets (400 mg of elemental calcium total) by mouth 3 (three) times daily.   carvedilol 12.5 MG tablet Commonly known as: COREG Take 1 tablet (12.5 mg total) by mouth 2 (two) times daily.   dorzolamide-timolol 22.3-6.8 MG/ML ophthalmic  solution Commonly known as: COSOPT Place 1 drop into the right eye 2 (two) times daily.   empagliflozin 10 MG Tabs tablet Commonly known as: Jardiance Take 1 tablet (10 mg total) by mouth daily before breakfast.   Entresto 24-26 MG Generic drug: sacubitril-valsartan TAKE ONE TABLET BY MOUTH TWICE DAILY What changed: how much to take   escitalopram 20 MG tablet Commonly known as: LEXAPRO Take 1 tablet (20 mg total) by mouth daily.   ferrous sulfate 325 (65 FE) MG tablet Take 325 mg by mouth daily with breakfast.   finasteride 5 MG tablet Commonly known as: PROSCAR Take 1 tablet (5 mg total) by mouth daily.   furosemide 40 MG tablet Commonly known as: LASIX Take 1 tablet (40 mg total) by mouth every morning AND 0.5 tablets (20 mg total) every evening. What changed: See the new instructions.   gabapentin 100 MG capsule Commonly known as: NEURONTIN Take 200 mg by mouth 2 (two) times daily.   latanoprost 0.005 % ophthalmic solution Commonly known as: XALATAN Place 1 drop into the right eye at bedtime.   levothyroxine 100 MCG tablet Commonly known as: Synthroid Take 1 tablet (100 mcg total) by mouth daily before breakfast.   Netarsudil Dimesylate 0.02 % Soln Place 1 drop into the right ear at bedtime.   nitroGLYCERIN 0.4 MG SL tablet Commonly known as: NITROSTAT Place 0.4 mg under the tongue every 5 (five) minutes as needed for chest  pain.   Polyethyl Glycol-Propyl Glycol 0.4-0.3 % Soln Place 1 drop into the left eye in the morning and at bedtime.   Repatha SureClick 945 MG/ML Soaj Generic drug: Evolocumab Inject 1 pen into the skin every 14 (fourteen) days. What changed: See the new instructions.   Rivaroxaban 15 MG Tabs tablet Commonly known as: XARELTO Take 1 tablet (15 mg total) by mouth daily with supper.   spironolactone 25 MG tablet Commonly known as: ALDACTONE Take 1 tablet (25 mg total) by mouth daily. Take at bedtime   tamsulosin 0.4 MG Caps  capsule Commonly known as: FLOMAX Take 0.4 mg by mouth at bedtime.   traMADol 50 MG tablet Commonly known as: ULTRAM Take 1-2 tablets (50-100 mg total) by mouth every 6 (six) hours as needed for moderate pain.   VITAMIN D PO Take 1 tablet by mouth daily.               Discharge Care Instructions  (From admission, onward)           Start     Ordered   05/12/21 0000  No dressing needed        05/12/21 0388            Follow-up Information     Armandina Gemma, MD Follow up.   Specialty: General Surgery Contact information: [address] [place] [address] [phone]                 Sarah Zerby, [facility] Office: [phone]   Signed: Armandina Gemma 05/12/2021, 7:34 AM

## 2021-05-14 LAB — SURGICAL PATHOLOGY

## 2021-05-15 NOTE — Progress Notes (Signed)
Pathology as expected with a papillary thyroid carcinoma, margins negative.  Will need to see endocrinology for review and recommendations.  Likely very favorable.  Spurgeon, MD [facility] [place] [facility] Office: [phone]

## 2021-05-17 ENCOUNTER — Ambulatory Visit (HOSPITAL_COMMUNITY)
Admission: RE | Admit: 2021-05-17 | Discharge: 2021-05-17 | Disposition: A | Discharge status: Home / Self Care | Payer: Medicare PPO | Source: Ambulatory Visit | Attending: Cardiology | Admitting: Cardiology

## 2021-05-17 ENCOUNTER — Other Ambulatory Visit: Payer: Self-pay

## 2021-05-17 VITALS — BP 110/60 | HR 64 | Wt 216.4 lb

## 2021-05-17 DIAGNOSIS — N183 Chronic kidney disease, stage 3 unspecified: Secondary | ICD-10-CM | POA: Insufficient documentation

## 2021-05-17 DIAGNOSIS — Z87891 Personal history of nicotine dependence: Secondary | ICD-10-CM | POA: Diagnosis not present

## 2021-05-17 DIAGNOSIS — I2510 Atherosclerotic heart disease of native coronary artery without angina pectoris: Secondary | ICD-10-CM | POA: Insufficient documentation

## 2021-05-17 DIAGNOSIS — I428 Other cardiomyopathies: Secondary | ICD-10-CM | POA: Insufficient documentation

## 2021-05-17 DIAGNOSIS — I5022 Chronic systolic (congestive) heart failure: Secondary | ICD-10-CM | POA: Diagnosis not present

## 2021-05-17 DIAGNOSIS — E785 Hyperlipidemia, unspecified: Secondary | ICD-10-CM | POA: Insufficient documentation

## 2021-05-17 DIAGNOSIS — Z8249 Family history of ischemic heart disease and other diseases of the circulatory system: Secondary | ICD-10-CM | POA: Insufficient documentation

## 2021-05-17 DIAGNOSIS — Z7901 Long term (current) use of anticoagulants: Secondary | ICD-10-CM | POA: Diagnosis not present

## 2021-05-17 DIAGNOSIS — G4733 Obstructive sleep apnea (adult) (pediatric): Secondary | ICD-10-CM | POA: Insufficient documentation

## 2021-05-17 DIAGNOSIS — Z7984 Long term (current) use of oral hypoglycemic drugs: Secondary | ICD-10-CM | POA: Insufficient documentation

## 2021-05-17 DIAGNOSIS — I480 Paroxysmal atrial fibrillation: Secondary | ICD-10-CM | POA: Insufficient documentation

## 2021-05-17 DIAGNOSIS — I739 Peripheral vascular disease, unspecified: Secondary | ICD-10-CM | POA: Insufficient documentation

## 2021-05-17 DIAGNOSIS — Z79899 Other long term (current) drug therapy: Secondary | ICD-10-CM | POA: Insufficient documentation

## 2021-05-17 NOTE — Progress Notes (Signed)
Patient ID: Chad Brown, male   DOB: November 23, 1940, 80 y.o.   MRN: [id number] PCP: Edrick Oh HF Cardiologist: Aundra Dubin  80 y.o. with paroxysmal atrial fibrillation and chronic systolic CHF thought to be due to nonischemic cardiomyopathy presents for followup of CHF. He has a cardiac history dating back to 1998, when he was admitted with chest pain and had cardiac cath showing nonobstructive CAD. In 4/14, he was found to be in atrial fibrillation.  Echo showed EF 30-35%.  He had DCCV back to NSR.  He was back in atrial fibrillation in 1/16, and EF was low again on TEE at that time.   Again, he had DCCV.  In 5/16, cardiac MRI showed persistently low EF, so given LBBB, he had Medtronic CRT-D device placed.  He was back in atrial fibrillation in 6/16 and had TEE-guided DCCV again with EF now 15-20% on TEE.  Repeat echo in 2/17 showed EF 55-60%. Echo in 1/19 showed EF 45-50%, mild LV and RV dilation.   Echo in 1/20 showed EF 45-50%, diffuse hypokinesis.   Atrial fibrillation ablation was done in 2/21.  Echo in 2/21 showed EF 40-45%, PASP 50 mmHg, mild MR.   Today he returns for HF follow up.Overall feeling fine. Mild SOB with steps. Denies PND/Orthopnea. Appetite ok. No fever or chills. Weight at home  pounds. Taking all medications.  Medtronic device interrogation:   Labs (6/16): LDL 153 Labs (7/16): HCT 40, LFTs normal, LFTs normal Labs (9/16): TSH normal, K 4.3, creatinine 1.03, BNP 419 Labs (10/17): K 4.2, creatinine 1.29, LFTs normal Labs (11/17): hgb 8.9 Labs (6/19): BNP 625 Labs (10/19): K 4.7, creatinine 1.66 => 1.58, LFTs normal Labs (1/20): LDL 128 Labs (11/20): K 3.6, creatinine 1.62 Labs (1/21): K 3.8, creatinine 1.89, BNP 578, LDL 115, TSH normal, LFTs normal Labs (3/21): K 4.7, creatinine 2.12 Labs (1/22): K 3.3, creatinine 1.34  PMH: 1. Atrial fibrillation: Paroxysmal.  Diagnosed 4/14, DCCV 4/14 to NSR.  DCCV 1/16 to NSR.  DCCV 6/16 to NSR.  2. Chronic systolic CHF: Nonischemic  cardiomyopathy.  LHC in 1998 with nonobstructive disease.  Cardiolite in 6/14 with no ischemia or infarction.  cMRI (5/16) with EF 34%, mildly dilated LV with diffuse HK worse in anterolateral wall, small punctate areas of LGE in anteroseptum and basal inferior wall (not CAD pattern).  TEE (6/16) with EF 15-20%.  He has Medtronic CRT-D device.  Echo (9/16) with EF 40%, moderate LV dilation, grade II diastolic dysfunction, normal RV size and systolic function, PASP 42 mmHg.  - Hypotension with Entresto.  - Echo (2/17) showed LV functional recovery, EF 55-60% with mild MR.    - Echo (1/19): EF 45-50%, mild LV dilation, mild RV dilation - Echo (1/20): EF 45-50%, diffuse hypokinesis, moderate diastolic dysfunction, normal RV size and systolic function.  - Echo (2/21): EF 40-45%, PASP 50 mmHg, mild MR.  - Echo (6/22): EF 40-45%, moderate LV dilation, mild LVH, RV normal 3. Hyperlipidemia: Myalgias with atorvastatin, Livalo, and pravastatin.  4. CKD stage 3 5. OA: s/p THR.  6. H/o melanoma 7. Anemia 8. Diverticulosis 9. OSA: Uses CPAP.  10. Pulmonary nodules: Followed by Dr. Delton Coombes.  11. Pancreatic pseudocysts 12. PAD: ABIs (2/20) were 0.94 on right and 1.03 on left.  13. Papillary thyroid carcinoma: s/p thyroidectomy 8/22.   SH: Married with 3 children, lives in [place], retired, quit smoking in 2001.    FH: Mother with MI  ROS: All systems reviewed and negative except as per  HPI.  Current Outpatient Medications  Medication Sig Dispense Refill   calcitRIOL (ROCALTROL) 0.25 MCG capsule Take 0.25 mcg by mouth every Monday, Wednesday, and Friday.     calcium carbonate (TUMS) 500 MG chewable tablet Chew 2 tablets (400 mg of elemental calcium total) by mouth 3 (three) times daily. 90 tablet 1   carvedilol (COREG) 12.5 MG tablet Take 1 tablet (12.5 mg total) by mouth 2 (two) times daily. 60 tablet 3   dorzolamide-timolol (COSOPT) 22.3-6.8 MG/ML ophthalmic solution Place 1 drop into the  right eye 2 (two) times daily.     empagliflozin (JARDIANCE) 10 MG TABS tablet Take 1 tablet (10 mg total) by mouth daily before breakfast. 90 tablet 3   ENTRESTO 24-26 MG TAKE ONE TABLET BY MOUTH TWICE DAILY (Patient taking differently: Take 0.5 tablets by mouth 2 (two) times daily.) 60 tablet 3   escitalopram (LEXAPRO) 20 MG tablet Take 1 tablet (20 mg total) by mouth daily. 90 tablet 0   ferrous sulfate 325 (65 FE) MG tablet Take 325 mg by mouth daily with breakfast.     finasteride (PROSCAR) 5 MG tablet Take 1 tablet (5 mg total) by mouth daily. 30 tablet 2   furosemide (LASIX) 40 MG tablet Take 1 tablet (40 mg total) by mouth every morning AND 0.5 tablets (20 mg total) every evening. (Patient taking differently: Take 40 mg by mouth every morning and 20 mg every evening.) 135 tablet 3   gabapentin (NEURONTIN) 100 MG capsule Take 200 mg by mouth 2 (two) times daily.      latanoprost (XALATAN) 0.005 % ophthalmic solution Place 1 drop into the right eye at bedtime.      levothyroxine (SYNTHROID) 100 MCG tablet Take 1 tablet (100 mcg total) by mouth daily before breakfast. 30 tablet 3   nitroGLYCERIN (NITROSTAT) 0.4 MG SL tablet Place 0.4 mg under the tongue every 5 (five) minutes as needed for chest pain.      Polyethyl Glycol-Propyl Glycol 0.4-0.3 % SOLN Place 1 drop into the left eye in the morning and at bedtime.     REPATHA SURECLICK 188 MG/ML SOAJ Inject 1 pen into the skin every 14 (fourteen) days. (Patient taking differently: Inject 140 mg into the skin every 14 (fourteen) days.) 2 mL 11   Rivaroxaban (XARELTO) 15 MG TABS tablet Take 1 tablet (15 mg total) by mouth daily with supper. 30 tablet 11   spironolactone (ALDACTONE) 25 MG tablet Take 1 tablet (25 mg total) by mouth daily. Take at bedtime 90 tablet 3   tamsulosin (FLOMAX) 0.4 MG CAPS Take 0.4 mg by mouth at bedtime.      traMADol (ULTRAM) 50 MG tablet Take 1-2 tablets (50-100 mg total) by mouth every 6 (six) hours as needed for  moderate pain. 15 tablet 0   VITAMIN D PO Take 1 tablet by mouth daily.     No current facility-administered medications for this encounter.   BP 110/60   Pulse 64   Wt 98.2 kg (216 lb 6.4 oz)   SpO2 97%   BMI 34.93 kg/m  General:  Well appearing. No resp difficulty HEENT: normal Neck: supple. no JVD. Carotids 2+ bilat; no bruits. No lymphadenopathy. Incision from thyroidectomy.  Cor: PMI nondisplaced. Regular rate & rhythm. No rubs, gallops or murmurs. Lungs: clear Abdomen: soft, nontender, nondistended. No hepatosplenomegaly. No bruits or masses. Good bowel sounds. Extremities: no cyanosis, clubbing, rash, edema Neuro: alert & orientedx3, cranial nerves grossly intact. moves all 4 extremities w/o difficulty.  Affect pleasant  Assessment/Plan: 1. Chronic systolic CHF: Suspect nonischemic cardiomyopathy.  EF 15-20% on 6/16 TEE.  Cardiac MRI in 5/16 showed a LGE pattern that was not suggestive of coronary disease (?myocarditis or infiltrative disease).  There may be a component of tachycardia-mediated cardiomyopathy.  However, the cardiac MRI appears to have been done when he was in NSR.  In 9/16, echo showed EF increased to 40%.  Echo in 1/20 showed EF 45-50%.  He has Medtronic CRT-D device.  Echo (2/21) with EF 40-45%.  Echo in 03/2021 EF 40-45%.   - Continue jardiance 10 mg daily - Continue  Lasix to 40 qam/20 qpm.  - Continue spironolactone to 25 mg daily.      - Continue Entresto 0.5 tab 24/26 bid.  - Continue current Coreg.    2. Atrial fibrillation: Poorly tolerated.  Now s/p atrial fibrillation ablation in 2/21 and off amiodarone.  No recent atrial fibrillation by device check.   - Continue Xarelto 15 mg daily.    3. Hyperlipidemia: Continue Repatha, check lipids today.  4. OSA: Continue CPAP.   5. PAD: Minimal claudication.      Amy Clegg  05/17/2021  Patient seen with NP, agree with the above note.   Patient has been doing well, no dyspnea walking on flat ground,  +dyspnea with stairs.  Echo in 6/22 with stable EF 40-45%.   General: NAD Neck: No JVD, no thyromegaly or thyroid nodule.  Lungs: Clear to auscultation bilaterally with normal respiratory effort. CV: Nondisplaced PMI.  Heart regular S1/S2, no S3/S4, no murmur.  No peripheral edema.  No carotid bruit.  Normal pedal pulses.  Abdomen: Soft, nontender, no hepatosplenomegaly, no distention.  Skin: Intact without lesions or rashes.  Neurologic: Alert and oriented x 3.  Psych: Normal affect. Extremities: No clubbing or cyanosis.  HEENT: Normal.   He is not volume overloaded by exam or by Optivol.  Continue current Lasix.  He has not tolerated increased doses of GDMT due to orthostatic symptoms, continue current meds.  Device interrogation shows no atrial fibrillation.  Recent creatinine stable.   Followup in 3-4 months.   Loralie Champagne 05/17/2021

## 2021-05-22 DIAGNOSIS — E890 Postprocedural hypothyroidism: Secondary | ICD-10-CM | POA: Diagnosis not present

## 2021-05-22 DIAGNOSIS — E042 Nontoxic multinodular goiter: Secondary | ICD-10-CM | POA: Diagnosis not present

## 2021-05-22 DIAGNOSIS — D440 Neoplasm of uncertain behavior of thyroid gland: Secondary | ICD-10-CM | POA: Diagnosis not present

## 2021-06-01 ENCOUNTER — Ambulatory Visit (INDEPENDENT_AMBULATORY_CARE_PROVIDER_SITE_OTHER): Payer: Medicare PPO

## 2021-06-01 DIAGNOSIS — I428 Other cardiomyopathies: Secondary | ICD-10-CM | POA: Diagnosis not present

## 2021-06-06 ENCOUNTER — Other Ambulatory Visit: Payer: Self-pay | Admitting: Internal Medicine

## 2021-06-06 LAB — CUP PACEART REMOTE DEVICE CHECK
Battery Remaining Longevity: 21 mo
Battery Voltage: 2.87 V
Brady Statistic AP VP Percent: 95.08 %
Brady Statistic AP VS Percent: 0.09 %
Brady Statistic AS VP Percent: 4.73 %
Brady Statistic AS VS Percent: 0.1 %
Brady Statistic RA Percent Paced: 93.77 %
Brady Statistic RV Percent Paced: 98.61 %
Date Time Interrogation Session: 20220901052706
HighPow Impedance: 73 Ohm
Implantable Lead Implant Date: 20160607
Implantable Lead Implant Date: 20160607
Implantable Lead Implant Date: 20160607
Implantable Lead Location: 753858
Implantable Lead Location: 753859
Implantable Lead Location: 753860
Implantable Lead Model: 4598
Implantable Lead Model: 5076
Implantable Pulse Generator Implant Date: 20160607
Lead Channel Impedance Value: 1045 Ohm
Lead Channel Impedance Value: 1140 Ohm
Lead Channel Impedance Value: 1349 Ohm
Lead Channel Impedance Value: 1406 Ohm
Lead Channel Impedance Value: 380 Ohm
Lead Channel Impedance Value: 437 Ohm
Lead Channel Impedance Value: 570 Ohm
Lead Channel Impedance Value: 646 Ohm
Lead Channel Impedance Value: 646 Ohm
Lead Channel Impedance Value: 760 Ohm
Lead Channel Impedance Value: 779 Ohm
Lead Channel Impedance Value: 969 Ohm
Lead Channel Impedance Value: 988 Ohm
Lead Channel Pacing Threshold Amplitude: 0.5 V
Lead Channel Pacing Threshold Amplitude: 0.5 V
Lead Channel Pacing Threshold Amplitude: 1.625 V
Lead Channel Pacing Threshold Pulse Width: 0.4 ms
Lead Channel Pacing Threshold Pulse Width: 0.4 ms
Lead Channel Pacing Threshold Pulse Width: 0.4 ms
Lead Channel Sensing Intrinsic Amplitude: 14.875 mV
Lead Channel Sensing Intrinsic Amplitude: 14.875 mV
Lead Channel Sensing Intrinsic Amplitude: 2 mV
Lead Channel Sensing Intrinsic Amplitude: 2 mV
Lead Channel Setting Pacing Amplitude: 1.5 V
Lead Channel Setting Pacing Amplitude: 2 V
Lead Channel Setting Pacing Amplitude: 2.25 V
Lead Channel Setting Pacing Pulse Width: 0.4 ms
Lead Channel Setting Pacing Pulse Width: 0.4 ms
Lead Channel Setting Sensing Sensitivity: 0.3 mV

## 2021-06-07 NOTE — Telephone Encounter (Signed)
Prescription refill request for Xarelto received.  Indication: Afib  Last office visit: 10/05/20 ([facility])  Weight: 98.2kg Age: 80 Scr: 1.45 (05/12/21) CrCl: 56.50ml/min   Pt 's CrCl >68ml/min. Dosage should be increased back to 20mg  per Lenna Sciara, PharmD. Confirmed with Dr Rayann Heman. Pt called and made aware of dose change.

## 2021-06-12 ENCOUNTER — Ambulatory Visit (INDEPENDENT_AMBULATORY_CARE_PROVIDER_SITE_OTHER): Payer: Medicare PPO

## 2021-06-12 ENCOUNTER — Other Ambulatory Visit: Payer: Self-pay | Admitting: Cardiology

## 2021-06-12 DIAGNOSIS — Z95810 Presence of automatic (implantable) cardiac defibrillator: Secondary | ICD-10-CM

## 2021-06-12 DIAGNOSIS — I5022 Chronic systolic (congestive) heart failure: Secondary | ICD-10-CM | POA: Diagnosis not present

## 2021-06-13 NOTE — Progress Notes (Signed)
Remote ICD transmission.   

## 2021-06-14 ENCOUNTER — Telehealth: Payer: Self-pay

## 2021-06-14 NOTE — Telephone Encounter (Signed)
Remote ICM transmission received.  Attempted call to patient regarding ICM remote transmission and left detailed message per DPR.  Advised to return call for any fluid symptoms or questions. Next ICM remote transmission scheduled 07/17/2021.    

## 2021-06-14 NOTE — Progress Notes (Signed)
EPIC Encounter for ICM Monitoring  Patient Name: Chad Brown is a 80 y.o. male Date: 06/14/2021 Primary Care Physican: Ginger Organ., MD Primary Cardiologist: Nishan/McLean Electrophysiologist: [facility]red Bi-V Pacing: 98.1%   04/05/2021 Weight: 209-211 lbs (baseline)   Time in AT/AF   0.0 hr/day (0.0%) (taking Xarelto)   Attempted call to patient and unable to reach.  Left detailed message per DPR regarding transmission. Transmission reviewed.    Optivol thoracic impedance normal.   Prescribed:  Furosemide 40 mg 1 tablet (40 mg total) by mouth in the morning and 0.5 tablet (20 mg total)  every evening. Spironolactone 25 mg Take 0.5 tablet (12.5 mg total) by mouth daily. Take at bedtime   Labs: 05/01/2021 Creatinine 1.43, BUN 24, Potassium 4.6, Sodium 142, GFR 50 03/09/2021 Creatinine 1.40, BUN 18, Potassium 3.7, Sodium 139, GFR 51 02/17/2021 Creatinine 1.64, BUN 17, Potassium 3.9, Sodium 139, GFR 42 02/07/2021 Creatinine 1.43, BUN 14,Potassium 3.8, Sodium 141,  GFR 50  10/27/2020 Creatinine 1.34, BUN 17, Potassium 3.3, Sodium 137, GFR 54 A complete set of results can be found in Results Review.   Recommendations:  Left voice mail with ICM number and encouraged to call if experiencing any fluid symptoms.   Follow-up plan: ICM clinic phone appointment on 07/17/2021.  91 day device clinic remote transmission 08/31/2021.     EP/Cardiology Next Office Visit:  08/17/2021 with Dr Aundra Dubin.  Recall 04/10/2021 with Tommye Standard, PA.   Recall 10/07/2021 with Dr Rayann Heman     Copy of ICM check sent to Dr. Rayann Heman.    3 month ICM trend: 06/12/2021.    1 Year ICM trend:       Rosalene Billings, RN 06/14/2021 11:39 AM

## 2021-06-22 ENCOUNTER — Ambulatory Visit (HOSPITAL_COMMUNITY)
Admission: RE | Admit: 2021-06-22 | Discharge: 2021-06-22 | Disposition: A | Discharge status: Home / Self Care | Payer: Medicare PPO | Source: Ambulatory Visit | Attending: Pulmonary Disease | Admitting: Pulmonary Disease

## 2021-06-22 ENCOUNTER — Other Ambulatory Visit: Payer: Self-pay

## 2021-06-22 DIAGNOSIS — I700 Atherosclerosis of aorta: Secondary | ICD-10-CM | POA: Diagnosis not present

## 2021-06-22 DIAGNOSIS — R918 Other nonspecific abnormal finding of lung field: Secondary | ICD-10-CM | POA: Diagnosis not present

## 2021-06-22 DIAGNOSIS — R911 Solitary pulmonary nodule: Secondary | ICD-10-CM | POA: Diagnosis not present

## 2021-06-22 DIAGNOSIS — J439 Emphysema, unspecified: Secondary | ICD-10-CM | POA: Diagnosis not present

## 2021-07-10 ENCOUNTER — Other Ambulatory Visit: Payer: Self-pay

## 2021-07-10 ENCOUNTER — Ambulatory Visit: Payer: Medicare PPO | Admitting: Pulmonary Disease

## 2021-07-10 ENCOUNTER — Encounter: Payer: Self-pay | Admitting: Pulmonary Disease

## 2021-07-10 VITALS — BP 116/80 | HR 82 | Temp 97.7°F | Ht 66.0 in | Wt 219.2 lb

## 2021-07-10 DIAGNOSIS — R053 Chronic cough: Secondary | ICD-10-CM

## 2021-07-10 DIAGNOSIS — R918 Other nonspecific abnormal finding of lung field: Secondary | ICD-10-CM

## 2021-07-10 DIAGNOSIS — Z85820 Personal history of malignant melanoma of skin: Secondary | ICD-10-CM

## 2021-07-10 DIAGNOSIS — Z87891 Personal history of nicotine dependence: Secondary | ICD-10-CM | POA: Diagnosis not present

## 2021-07-10 NOTE — Patient Instructions (Signed)
Thank you for visiting Dr. Valeta Harms at [facility]. Today we recommend the following:  Orders Placed This Encounter  Procedures   CT Chest Wo Contrast   Return in about 1 year (around 07/10/2022) for with APP or Dr. Valeta Harms.    Please do your part to reduce the spread of COVID-19.

## 2021-07-10 NOTE — Progress Notes (Signed)
Synopsis: Referred in March 2022 for multiple lung nodules by Ginger Organ., MD  Subjective:   PATIENT ID: Chad Brown GENDER: male DOB: May 13, 1941, MRN: [id number]  Chief Complaint  Patient presents with   Follow-up    Follow up on ct scan.    PMH Former smoker, 40+ years, quit in 2001, CT scan with multiple pulmonary nodules evaluated by PET scan.  Patient has a past medical history of stage II melanoma of the lower leg status post resection in 5400, chronic systolic heart failure, AICD placement, history of hypertension hyperlipidemia.  Patient initially seen by Dr. Chase Caller for evaluation of multiple pulmonary nodules.  There was initial mentioning of potential metastatic disease with history of melanoma.  Patient underwent nuclear medicine pet imaging which revealed low-level uptake within the upper lobe nodules.  She also has a calcified lesion within the pancreas and a new thyroid nodule within the right thyroid gland was found that did have PET uptake.  From a respiratory standpoint the patient is breathing okay.  He had pulmonary function tests completed prior to today's office visit which revealed normal ratio, a normal FEV1, a TLC of 86%, DLCO of 69%.  There was no significant bronchodilator response.  Patient's PFTs were reviewed and interpreted today in the office with patient.  OV 07/10/2021.  Here today for follow-up after recent CT scan of the chest.  Nodules that we have been following are now stable some have completely regressed altogether.  He still has a small right upper lobe nodule.  Otherwise from respiratory standpoint patient has been doing well.  We reviewed all of this today in the office.      Past Medical History:  Diagnosis Date   Adenomatous colon polyp    tubular   AICD (automatic cardioverter/defibrillator) present 03/08/2015   MDT CRTD dual pacemaker and defib   Anemia    iron deficient   Arthritis    "about all my joints; hands, knees,  back" (03/08/2015)   Atherosclerosis    Cataract    left eye small   Cholelithiasis    gallstones   Chronic systolic CHF (congestive heart failure) ([place])    a. New dx 12/2012 ? NICM, may be r/t afib. b. Nuc 03/2013 - normal;  c. 03/2015 TEE EF 15-20%.   Colon polyp, hyperplastic 01/2015   removed precancerous lesions   Depression    Diverticulosis    Dysrhythmia    afib   GERD (gastroesophageal reflux disease)    Glaucoma    right eye   Hyperlipidemia    Hypertension    Melanoma of eye ([place]) 2000's   "right; it's never been biopsied"   Melanoma of lower leg ([place]) 2015   "left; right at my knee"   Myocardial infarction ([place]) 1998   OSA (obstructive sleep apnea) 01/04/2016   no longer tolerates cpap   Peripheral vision loss, right 2006   Persistent atrial fibrillation ([place])    a. Dx 12/2012, s/p TEE/DCCV 01/26/13. b. On Xarelto (CHA2DS2VASc = 3);  c. 03/2015 TEE (EF 15-20%, no LAA thrombus) and DCCV - amio increased to 200 mg bid.   Pneumonia    Urinary hesitancy due to benign prostatic hypertrophy      Family History  Problem Relation Age of Onset   Kidney disease Mother    Heart disease Mother        MI, open heart   Diabetes Mother        dialysis  Leukemia Father    Colon cancer Paternal Uncle    Lung cancer Paternal Uncle        x 2   Prostate cancer Paternal Uncle    Diabetes Maternal Grandmother    Heart attack Maternal Uncle    Diabetes Maternal Aunt        x 3   Diabetes Maternal Uncle    Thyroid disease Sister      Past Surgical History:  Procedure Laterality Date   ATRIAL FIBRILLATION ABLATION N/A 11/03/2019   Procedure: ATRIAL FIBRILLATION ABLATION;  Surgeon: Thompson Grayer, MD;  Location: [facility];  Service: Cardiovascular;  Laterality: N/A;   BACK SURGERY     upper back, cannot turn neck well   [place] N/A 01/26/2013   Procedure: CARDIOVERSION;  Surgeon: Lelon Perla, MD;  Location: [facility];   Service: Cardiovascular;  Laterality: N/A;   CARDIOVERSION N/A 03/23/2015   Procedure: CARDIOVERSION;  Surgeon: Jerline Pain, MD;  Location: [facility];  Service: Cardiovascular;  Laterality: N/A;   CARDIOVERSION N/A 08/14/2017   Procedure: CARDIOVERSION;  Surgeon: Josue Hector, MD;  Location: [facility];  Service: Cardiovascular;  Laterality: N/A;   CATARACT EXTRACTION Right ~ 2006   COLONOSCOPY WITH PROPOFOL N/A 02/10/2015   Procedure: COLONOSCOPY WITH PROPOFOL;  Surgeon: Gatha Mayer, MD;  Location: [facility];  Service: Endoscopy;  Laterality: N/A;   COLONOSCOPY WITH PROPOFOL N/A 08/07/2016   Procedure: COLONOSCOPY WITH PROPOFOL;  Surgeon: Gatha Mayer, MD;  Location: [facility];  Service: Endoscopy;  Laterality: N/A;   ENTEROSCOPY N/A 08/17/2015   Procedure: ENTEROSCOPY;  Surgeon: Gatha Mayer, MD;  Location: [facility];  Service: Endoscopy;  Laterality: N/A;   EP IMPLANTABLE DEVICE N/A 03/08/2015   MDT Hillery Aldo CRT-D for nonischemic CM by Dr Rayann Heman for primary prevention   GLAUCOMA SURGERY Right ~ 2006   "put 3 stents in to drain fluid" (03/08/2015) not successful, sent to [facility] to try to get last stent out   HOT HEMOSTASIS N/A 08/07/2016   Procedure: HOT HEMOSTASIS (ARGON PLASMA COAGULATION/BICAP);  Surgeon: Gatha Mayer, MD;  Location: [facility];  Service: Endoscopy;  Laterality: N/A;   INCISION AND DRAINAGE ABSCESS POSTERIOR CERVICALSPINE  05/2012   JOINT REPLACEMENT     MELANOMA EXCISION Left 2015   "lower leg; right at my knee"   REFRACTIVE SURGERY Right ~ 2006 X 2   "twice; both done at [facility]" (03/08/2015   SURGERY SCROTAL / TESTICULAR Right 1990's   TEE WITHOUT CARDIOVERSION N/A 01/26/2013   Procedure: TRANSESOPHAGEAL ECHOCARDIOGRAM (TEE);  Surgeon: Lelon Perla, MD;  Location: [facility];  Service: Cardiovascular;  Laterality: N/A;  [name] anes. /    TEE WITHOUT CARDIOVERSION N/A 10/05/2014   Procedure: TRANSESOPHAGEAL ECHOCARDIOGRAM (TEE)  with cardioversion;   Surgeon: Thayer Headings, MD;  Location: [facility];  Service: Cardiovascular;  Laterality: N/A;  12:52 synched cardioversion at 120 joules,...afib to SR...12 lead EKG ordered.Marland KitchenMarland KitchenCardiozem d/c'ed per MD verbal order at SR   TEE WITHOUT CARDIOVERSION N/A 03/23/2015   Procedure: TRANSESOPHAGEAL ECHOCARDIOGRAM (TEE);  Surgeon: Jerline Pain, MD;  Location: [facility];  Service: Cardiovascular;  Laterality: N/A;   TEE WITHOUT CARDIOVERSION N/A 11/02/2019   Procedure: TRANSESOPHAGEAL ECHOCARDIOGRAM (TEE);  Surgeon: Josue Hector, MD;  Location: [facility];  Service: Cardiovascular;  Laterality: N/A;   THORACIC SPINE SURGERY  03/2000   "ground calcium deposits from upper thoracic" (01/26/2013)   THYROIDECTOMY N/A 05/11/2021  Procedure: TOTAL THYROIDECTOMY;  Surgeon: Armandina Gemma, MD;  Location: [facility];  Service: General;  Laterality: N/A;   TOTAL HIP ARTHROPLASTY Right 06/2007    Social History   Socioeconomic History   Marital status: Married    Spouse name: Not on file   Number of children: 3   Years of education: Not on file   Highest education level: Not on file  Occupational History   Occupation: retired  Tobacco Use   Smoking status: Former    Packs/day: 3.00    Years: 48.00    Pack years: 144.00    Types: Cigarettes    Quit date: 09/28/2000    Years since quitting: 20.7   Smokeless tobacco: Never  Vaping Use   Vaping Use: Never used  Substance and Sexual Activity   Alcohol use: No   Drug use: No   Sexual activity: Not Currently  Other Topics Concern   Not on file  Social History Narrative   Pt lives in [place] with spouse.  3 children are grown and healthy.   Retired.  Ran a country store for 30 years, previously worked in the [employer] for 16 years   Social Determinants of Radio broadcast assistant Strain: Not on [employer] Insecurity: Not on file  Transportation Needs: No Transportation Needs   Lack of Transportation (Medical): No   Lack of  Transportation (Non-Medical): No  Physical Activity: Inactive   Days of Exercise per Week: 0 days   Minutes of Exercise per Session: 0 min  Stress: Not on file  Social Connections: Not on file  Intimate Partner Violence: Not At Risk   Fear of Current or Ex-Partner: No   Emotionally Abused: No   Physically Abused: No   Sexually Abused: No     Allergies  Allergen Reactions   Pravastatin Sodium Other (See Comments)    Joint and muscle pain   Azithromycin Diarrhea     Outpatient Medications Prior to Visit  Medication Sig Dispense Refill   calcitRIOL (ROCALTROL) 0.25 MCG capsule Take 0.25 mcg by mouth every Monday, Wednesday, and Friday.     calcium carbonate (TUMS) 500 MG chewable tablet Chew 2 tablets (400 mg of elemental calcium total) by mouth 3 (three) times daily. 90 tablet 1   carvedilol (COREG) 12.5 MG tablet Take 1 tablet (12.5 mg total) by mouth 2 (two) times daily. 180 tablet 3   dorzolamide-timolol (COSOPT) 22.3-6.8 MG/ML ophthalmic solution Place 1 drop into the right eye 2 (two) times daily.     empagliflozin (JARDIANCE) 10 MG TABS tablet Take 1 tablet (10 mg total) by mouth daily before breakfast. 90 tablet 3   ENTRESTO 24-26 MG TAKE ONE TABLET BY MOUTH TWICE DAILY (Patient taking differently: Take 0.5 tablets by mouth 2 (two) times daily.) 60 tablet 3   escitalopram (LEXAPRO) 20 MG tablet Take 1 tablet (20 mg total) by mouth daily. 90 tablet 0   ferrous sulfate 325 (65 FE) MG tablet Take 325 mg by mouth daily with breakfast.     finasteride (PROSCAR) 5 MG tablet Take 1 tablet (5 mg total) by mouth daily. 30 tablet 2   furosemide (LASIX) 40 MG tablet Take 1 tablet (40 mg total) by mouth every morning AND 0.5 tablets (20 mg total) every evening. (Patient taking differently: Take 40 mg by mouth every morning and 20 mg every evening.) 135 tablet 3   gabapentin (NEURONTIN) 100 MG capsule Take 200 mg by mouth 2 (two) times daily.  latanoprost (XALATAN) 0.005 % ophthalmic  solution Place 1 drop into the right eye at bedtime.      levothyroxine (SYNTHROID) 100 MCG tablet Take 1 tablet (100 mcg total) by mouth daily before breakfast. 30 tablet 3   nitroGLYCERIN (NITROSTAT) 0.4 MG SL tablet Place 0.4 mg under the tongue every 5 (five) minutes as needed for chest pain.      Polyethyl Glycol-Propyl Glycol 0.4-0.3 % SOLN Place 1 drop into the left eye in the morning and at bedtime.     REPATHA SURECLICK 433 MG/ML SOAJ Inject 1 pen into the skin every 14 (fourteen) days. (Patient taking differently: Inject 140 mg into the skin every 14 (fourteen) days.) 2 mL 11   rivaroxaban (XARELTO) 20 MG TABS tablet Take 1 tablet (20 mg total) by mouth daily with supper. 30 tablet 2   spironolactone (ALDACTONE) 25 MG tablet Take 1 tablet (25 mg total) by mouth daily. Take at bedtime 90 tablet 3   tamsulosin (FLOMAX) 0.4 MG CAPS Take 0.4 mg by mouth at bedtime.      traMADol (ULTRAM) 50 MG tablet Take 1-2 tablets (50-100 mg total) by mouth every 6 (six) hours as needed for moderate pain. 15 tablet 0   VITAMIN D PO Take 1 tablet by mouth daily.     No facility-administered medications prior to visit.    Review of Systems  Constitutional:  Negative for chills, fever, malaise/fatigue and weight loss.  HENT:  Negative for hearing loss, sore throat and tinnitus.   Eyes:  Negative for blurred vision and double vision.  Respiratory:  Positive for cough. Negative for hemoptysis, sputum production, shortness of breath, wheezing and stridor.   Cardiovascular:  Negative for chest pain, palpitations, orthopnea, leg swelling and PND.  Gastrointestinal:  Negative for abdominal pain, constipation, diarrhea, heartburn, nausea and vomiting.  Genitourinary:  Negative for dysuria, hematuria and urgency.  Musculoskeletal:  Negative for joint pain and myalgias.  Skin:  Negative for itching and rash.  Neurological:  Negative for dizziness, tingling, weakness and headaches.  Endo/Heme/Allergies:   Negative for environmental allergies. Does not bruise/bleed easily.  Psychiatric/Behavioral:  Negative for depression. The patient is not nervous/anxious and does not have insomnia.   All other systems reviewed and are negative.   Objective:  Physical Exam Vitals reviewed.  Constitutional:      General: He is not in acute distress.    Appearance: He is well-developed. He is obese.  HENT:     Head: Normocephalic and atraumatic.  Eyes:     General: No scleral icterus.    Conjunctiva/sclera: Conjunctivae normal.     Pupils: Pupils are equal, round, and reactive to light.  Neck:     Vascular: No JVD.     Trachea: No tracheal deviation.  Cardiovascular:     Rate and Rhythm: Normal rate and regular rhythm.     Heart sounds: Normal heart sounds. No murmur heard. Pulmonary:     Effort: Pulmonary effort is normal. No tachypnea, accessory muscle usage or respiratory distress.     Breath sounds: Normal breath sounds. No stridor. No wheezing, rhonchi or rales.  Abdominal:     General: Bowel sounds are normal. There is no distension.     Palpations: Abdomen is soft.     Tenderness: There is no abdominal tenderness.  Musculoskeletal:        General: No tenderness.     Cervical back: Neck supple.  Lymphadenopathy:     Cervical: No cervical adenopathy.  Skin:  General: Skin is warm and dry.     Capillary Refill: Capillary refill takes less than 2 seconds.     Findings: No rash.  Neurological:     Mental Status: He is alert and oriented to person, place, and time.  Psychiatric:        Behavior: Behavior normal.     Vitals:   07/10/21 0912  BP: 116/80  Pulse: 82  Temp: 97.7 F (36.5 C)  TempSrc: Oral  SpO2: 94%  Weight: 219 lb 3.2 oz (99.4 kg)  Height: 5\' 6"  (1.676 m)   94% on RA BMI Readings from Last 3 Encounters:  07/10/21 35.38 kg/m  05/17/21 34.93 kg/m  05/11/21 34.22 kg/m   Wt Readings from Last 3 Encounters:  07/10/21 219 lb 3.2 oz (99.4 kg)  05/17/21 216 lb  6.4 oz (98.2 kg)  05/11/21 212 lb (96.2 kg)     CBC    Component Value Date/Time   WBC 6.2 05/01/2021 1014   RBC 4.08 (L) 05/01/2021 1014   HGB 12.6 (L) 05/01/2021 1014   HCT 39.3 05/01/2021 1014   PLT 141 (L) 05/01/2021 1014   MCV 96.3 05/01/2021 1014   MCH 30.9 05/01/2021 1014   MCHC 32.1 05/01/2021 1014   RDW 15.1 05/01/2021 1014   LYMPHSABS 1.0 03/09/2021 1041   MONOABS 0.4 03/09/2021 1041   EOSABS 0.1 03/09/2021 1041   BASOSABS 0.0 03/09/2021 1041    Chest Imaging: 12/07/2020 nuclear medicine PET scan: Patient has ill-defined right upper lobe nodular opacities largest at 1.2 cm, SUV max 1.8 similar to imaging which it measured 1.4 cm in January 2022.  Has a area of soft tissue thickening with an SUV max of 3.3.  These have not overtly grown in size in comparison to previous CT imaging in January.  No other concerning evidence of metastasis also has a right-sided thyroid nodule. The patient's images have been independently reviewed by me.    07/10/2021: CT scan of the chest: Here today for follow-up for pulmonary nodules.  Still has a few scattered small subcentimeter areas of groundglass.  Previous lung nodules that we were following some have regressed completely.  Still has a small nodule in the right upper lobe associated centrilobular and paraseptal emphysema consistent with his COPD. The patient's images have been independently reviewed by me.    Pulmonary Functions Testing Results: PFT Results Latest Ref Rng & Units 12/28/2020 11/18/2018  FVC-Pre L 2.58 2.62  FVC-Predicted Pre % 80 78  FVC-Post L 2.78 2.89  FVC-Predicted Post % 86 86  Pre FEV1/FVC % % 74 71  Post FEV1/FCV % % 76 76  FEV1-Pre L 1.92 1.86  FEV1-Predicted Pre % 85 78  FEV1-Post L 2.11 2.19  DLCO uncorrected ml/min/mmHg 14.47 16.65  DLCO UNC% % 69 78  DLCO corrected ml/min/mmHg 14.47 18.73  DLCO COR %Predicted % 69 88  DLVA Predicted % 77 96  TLC L 5.21 6.02  TLC % Predicted % 86 99  RV %  Predicted % 110 139    FeNO:   Pathology:   Echocardiogram:   Heart Catheterization:     Assessment & Plan:     ICD-10-CM   1. Lung nodules  R91.8 CT Chest Wo Contrast    2. Former smoker  Z87.891     3. History of melanoma  Z85.820     4. Chronic cough  R05.3     5. Stopped smoking with greater than 40 pack year history  Z87.891  Discussion:  This is an 80 year old male, former smoker, multiple pulmonary nodules, small lesions in the right upper lobe suspected were inflammatory nature.  Had subsequent CT scan follow-up which shows lung nodules that have now progressed.  From respiratory standpoint he is doing well.  He still has a few persistent small areas of groundglass as well as a small nodule in the right upper lobe on the peripheral anterior side.    Plan: Reviewed CT imaging today in the office with patient. Repeat noncontrasted CT scan of the chest in 1 year. If stable likely needs no additional follow-up. Patient is agreeable to this plan.    Current Outpatient Medications:    calcitRIOL (ROCALTROL) 0.25 MCG capsule, Take 0.25 mcg by mouth every Monday, Wednesday, and Friday., Disp: , Rfl:    calcium carbonate (TUMS) 500 MG chewable tablet, Chew 2 tablets (400 mg of elemental calcium total) by mouth 3 (three) times daily., Disp: 90 tablet, Rfl: 1   carvedilol (COREG) 12.5 MG tablet, Take 1 tablet (12.5 mg total) by mouth 2 (two) times daily., Disp: 180 tablet, Rfl: 3   dorzolamide-timolol (COSOPT) 22.3-6.8 MG/ML ophthalmic solution, Place 1 drop into the right eye 2 (two) times daily., Disp: , Rfl:    empagliflozin (JARDIANCE) 10 MG TABS tablet, Take 1 tablet (10 mg total) by mouth daily before breakfast., Disp: 90 tablet, Rfl: 3   ENTRESTO 24-26 MG, TAKE ONE TABLET BY MOUTH TWICE DAILY (Patient taking differently: Take 0.5 tablets by mouth 2 (two) times daily.), Disp: 60 tablet, Rfl: 3   escitalopram (LEXAPRO) 20 MG tablet, Take 1 tablet (20 mg total) by  mouth daily., Disp: 90 tablet, Rfl: 0   ferrous sulfate 325 (65 FE) MG tablet, Take 325 mg by mouth daily with breakfast., Disp: , Rfl:    finasteride (PROSCAR) 5 MG tablet, Take 1 tablet (5 mg total) by mouth daily., Disp: 30 tablet, Rfl: 2   furosemide (LASIX) 40 MG tablet, Take 1 tablet (40 mg total) by mouth every morning AND 0.5 tablets (20 mg total) every evening. (Patient taking differently: Take 40 mg by mouth every morning and 20 mg every evening.), Disp: 135 tablet, Rfl: 3   gabapentin (NEURONTIN) 100 MG capsule, Take 200 mg by mouth 2 (two) times daily. , Disp: , Rfl:    latanoprost (XALATAN) 0.005 % ophthalmic solution, Place 1 drop into the right eye at bedtime. , Disp: , Rfl:    levothyroxine (SYNTHROID) 100 MCG tablet, Take 1 tablet (100 mcg total) by mouth daily before breakfast., Disp: 30 tablet, Rfl: 3   nitroGLYCERIN (NITROSTAT) 0.4 MG SL tablet, Place 0.4 mg under the tongue every 5 (five) minutes as needed for chest pain. , Disp: , Rfl:    Polyethyl Glycol-Propyl Glycol 0.4-0.3 % SOLN, Place 1 drop into the left eye in the morning and at bedtime., Disp: , Rfl:    REPATHA SURECLICK 505 MG/ML SOAJ, Inject 1 pen into the skin every 14 (fourteen) days. (Patient taking differently: Inject 140 mg into the skin every 14 (fourteen) days.), Disp: 2 mL, Rfl: 11   rivaroxaban (XARELTO) 20 MG TABS tablet, Take 1 tablet (20 mg total) by mouth daily with supper., Disp: 30 tablet, Rfl: 2   spironolactone (ALDACTONE) 25 MG tablet, Take 1 tablet (25 mg total) by mouth daily. Take at bedtime, Disp: 90 tablet, Rfl: 3   tamsulosin (FLOMAX) 0.4 MG CAPS, Take 0.4 mg by mouth at bedtime. , Disp: , Rfl:    traMADol (ULTRAM) 50  MG tablet, Take 1-2 tablets (50-100 mg total) by mouth every 6 (six) hours as needed for moderate pain., Disp: 15 tablet, Rfl: 0   VITAMIN D PO, Take 1 tablet by mouth daily., Disp: , Rfl:   I spent 40 minutes dedicated to the care of this patient on the date of this encounter to  include pre-visit review of records, face-to-face time with the patient discussing conditions above, post visit ordering of testing, clinical documentation with the electronic health record, making appropriate referrals as documented, and communicating necessary findings to members of the patients care team.   Garner Nash, DO [facility] 07/10/2021 9:18 AM

## 2021-07-17 ENCOUNTER — Ambulatory Visit (INDEPENDENT_AMBULATORY_CARE_PROVIDER_SITE_OTHER): Payer: Medicare PPO

## 2021-07-17 ENCOUNTER — Telehealth: Payer: Self-pay

## 2021-07-17 DIAGNOSIS — I5022 Chronic systolic (congestive) heart failure: Secondary | ICD-10-CM | POA: Diagnosis not present

## 2021-07-17 DIAGNOSIS — Z95810 Presence of automatic (implantable) cardiac defibrillator: Secondary | ICD-10-CM

## 2021-07-17 NOTE — Telephone Encounter (Signed)
Remote ICM transmission received.  Attempted call to patient regarding ICM remote transmission and left detailed message per DPR.  Advised to return call for any fluid symptoms or questions. Next ICM remote transmission scheduled 07/24/2021.

## 2021-07-17 NOTE — Progress Notes (Signed)
EPIC Encounter for ICM Monitoring  Patient Name: Chad Brown is a 80 y.o. male Date: 07/17/2021 Primary Care Physican: Ginger Organ., MD Primary Cardiologist: Nishan/McLean Electrophysiologist: Allred Bi-V Pacing: 98.1%   04/05/2021 Weight: 209-211 lbs (baseline)   Time in AT/AF   <0.1 hr/day (<0.1%) (taking Xarelto)   Attempted call to patient and unable to reach.  Left detailed message per DPR regarding transmission. Transmission reviewed.    Optivol thoracic impedance suggesting possible ongoing fluid accumulation since 10/11.   Prescribed:  Furosemide 40 mg 1 tablet (40 mg total) by mouth in the morning and 0.5 tablet (20 mg total)  every evening. Spironolactone 25 mg Take 0.5 tablet (12.5 mg total) by mouth daily. Take at bedtime   Labs: 05/01/2021 Creatinine 1.43, BUN 24, Potassium 4.6, Sodium 142, GFR 50 03/09/2021 Creatinine 1.40, BUN 18, Potassium 3.7, Sodium 139, GFR 51 02/17/2021 Creatinine 1.64, BUN 17, Potassium 3.9, Sodium 139, GFR 42 02/07/2021 Creatinine 1.43, BUN 14,Potassium 3.8, Sodium 141,  GFR 50  10/27/2020 Creatinine 1.34, BUN 17, Potassium 3.3, Sodium 137, GFR 54 A complete set of results can be found in Results Review.   Recommendations:  Left voice mail with ICM number and encouraged to return call if experiencing any fluid symptoms.   Follow-up plan: ICM clinic phone appointment on 07/24/2021 (manual) to recheck fluid levels.  91 day device clinic remote transmission 08/31/2021.     EP/Cardiology Next Office Visit:  Last visit with Dr Aundra Dubin was 05/17/2021 and needed a 3-4 month follow up.     Recall 10/07/2021 with Dr Rayann Heman     Copy of ICM check sent to Dr. Rayann Heman.   Will sent to Dr Aundra Dubin for review if patient is reached.    3 month ICM trend: 07/17/2021.    1 Year ICM trend:       Rosalene Billings, RN 07/17/2021 10:00 AM

## 2021-07-24 ENCOUNTER — Ambulatory Visit (INDEPENDENT_AMBULATORY_CARE_PROVIDER_SITE_OTHER): Payer: Medicare PPO

## 2021-07-24 DIAGNOSIS — E211 Secondary hyperparathyroidism, not elsewhere classified: Secondary | ICD-10-CM | POA: Diagnosis not present

## 2021-07-24 DIAGNOSIS — I5022 Chronic systolic (congestive) heart failure: Secondary | ICD-10-CM

## 2021-07-24 DIAGNOSIS — I5042 Chronic combined systolic (congestive) and diastolic (congestive) heart failure: Secondary | ICD-10-CM | POA: Diagnosis not present

## 2021-07-24 DIAGNOSIS — D638 Anemia in other chronic diseases classified elsewhere: Secondary | ICD-10-CM | POA: Diagnosis not present

## 2021-07-24 DIAGNOSIS — N1831 Chronic kidney disease, stage 3a: Secondary | ICD-10-CM | POA: Diagnosis not present

## 2021-07-24 DIAGNOSIS — I129 Hypertensive chronic kidney disease with stage 1 through stage 4 chronic kidney disease, or unspecified chronic kidney disease: Secondary | ICD-10-CM | POA: Diagnosis not present

## 2021-07-24 DIAGNOSIS — Z95810 Presence of automatic (implantable) cardiac defibrillator: Secondary | ICD-10-CM

## 2021-07-27 ENCOUNTER — Telehealth: Payer: Self-pay

## 2021-07-27 NOTE — Telephone Encounter (Signed)
I spoke with the patient about missed ICM transmission. The patient states he has not been able to send a transmission all week. I called tech support with the patient. Transmission received 07/27/2021.

## 2021-07-28 DIAGNOSIS — I5042 Chronic combined systolic (congestive) and diastolic (congestive) heart failure: Secondary | ICD-10-CM | POA: Diagnosis not present

## 2021-07-28 DIAGNOSIS — I129 Hypertensive chronic kidney disease with stage 1 through stage 4 chronic kidney disease, or unspecified chronic kidney disease: Secondary | ICD-10-CM | POA: Diagnosis not present

## 2021-07-28 DIAGNOSIS — N1832 Chronic kidney disease, stage 3b: Secondary | ICD-10-CM | POA: Diagnosis not present

## 2021-07-28 DIAGNOSIS — E211 Secondary hyperparathyroidism, not elsewhere classified: Secondary | ICD-10-CM | POA: Diagnosis not present

## 2021-07-28 DIAGNOSIS — D638 Anemia in other chronic diseases classified elsewhere: Secondary | ICD-10-CM | POA: Diagnosis not present

## 2021-07-28 NOTE — Progress Notes (Signed)
EPIC Encounter for ICM Monitoring  Patient Name: ZEKI BEDROSIAN is a 80 y.o. male Date: 07/28/2021 Primary Care Physican: Ginger Organ., MD Primary Cardiologist: Nishan/McLean Electrophysiologist: Allred Bi-V Pacing: 98.2%   07/28/2021 Weight: 212 lbs    Time in AT/AF   <0.1 hr/day (<0.1%) (taking Xarelto)   Spoke with patient and heart failure questions reviewed.  Pt reports weight gain of 2-3 lbs and feeling a little fatigued.   Optivol thoracic impedance suggesting fluid levels returned close to normal.   Prescribed:  Furosemide 40 mg 1 tablet (40 mg total) by mouth in the morning and 0.5 tablet (20 mg total)  every evening. Spironolactone 25 mg Take 0.5 tablet (12.5 mg total) by mouth daily. Take at bedtime   Labs: 05/01/2021 Creatinine 1.43, BUN 24, Potassium 4.6, Sodium 142, GFR 50 03/09/2021 Creatinine 1.40, BUN 18, Potassium 3.7, Sodium 139, GFR 51 02/17/2021 Creatinine 1.64, BUN 17, Potassium 3.9, Sodium 139, GFR 42 02/07/2021 Creatinine 1.43, BUN 14,Potassium 3.8, Sodium 141,  GFR 50  10/27/2020 Creatinine 1.34, BUN 17, Potassium 3.3, Sodium 137, GFR 54 A complete set of results can be found in Results Review.   Recommendations:  Advised to limit salt intake and continue to monitor for fluid symptoms.  Advised to call if condition changes.    Follow-up plan: ICM clinic phone appointment on 08/21/2021.  91 day device clinic remote transmission 08/31/2021.     EP/Cardiology Next Office Visit:  08/18/2021 with Dr Aundra Dubin.     Recall 10/07/2021 with Dr Rayann Heman     Copy of ICM check sent to Dr. Rayann Heman.     3 month ICM trend: 07/27/2021.    1 Year ICM trend:       Rosalene Billings, RN 07/28/2021 9:26 AM

## 2021-08-02 ENCOUNTER — Other Ambulatory Visit: Payer: Self-pay

## 2021-08-02 ENCOUNTER — Ambulatory Visit: Payer: Medicare PPO | Admitting: Endocrinology

## 2021-08-02 DIAGNOSIS — E039 Hypothyroidism, unspecified: Secondary | ICD-10-CM

## 2021-08-02 DIAGNOSIS — C73 Malignant neoplasm of thyroid gland: Secondary | ICD-10-CM | POA: Insufficient documentation

## 2021-08-02 LAB — VITAMIN D 25 HYDROXY (VIT D DEFICIENCY, FRACTURES): VITD: 37.69 ng/mL (ref 30.00–100.00)

## 2021-08-02 LAB — T4, FREE: Free T4: 0.64 ng/dL (ref 0.60–1.60)

## 2021-08-02 LAB — TSH: TSH: 9.19 u[IU]/mL — ABNORMAL HIGH (ref 0.35–5.50)

## 2021-08-02 NOTE — Progress Notes (Signed)
Subjective:    Patient ID: Chad Brown, male    DOB: Jul 07, 1941, 80 y.o.   MRN: [id number]  HPI Pt returns for f/u of PTC, with this chronology:  10/19 chest CT: pulm nodules 10/21 chest CT: mult pulm nodules 1/22 chest CT: pulm nodules are worse 3/22 PET CT: hypermetabolism in the thyroid and chest 4/22 [place] MNG 5/22 chest CT: pulm nodules are smaller 5/22 right thyroid bx is cat 5 5/22 due to question of medical risk of surgery, I ordered nuc med thyroid body scan 6/22 radiol canceled the nuc med body scan.  8/22 thyroidect   path shows 2.1 cm right PTC (T2N0) 9/22 chest CT: pulm nodules are stable/smaller she takes synthroid as rx'ed.  He takes vit-D, uncertain dosage.   Past Medical History:  Diagnosis Date   Adenomatous colon polyp    tubular   AICD (automatic cardioverter/defibrillator) present 03/08/2015   MDT CRTD dual pacemaker and defib   Anemia    iron deficient   Arthritis    "about all my joints; hands, knees, back" (03/08/2015)   Atherosclerosis    Cataract    left eye small   Cholelithiasis    gallstones   Chronic systolic CHF (congestive heart failure) ([place])    a. New dx 12/2012 ? NICM, may be r/t afib. b. Nuc 03/2013 - normal;  c. 03/2015 TEE EF 15-20%.   Colon polyp, hyperplastic 01/2015   removed precancerous lesions   Depression    Diverticulosis    Dysrhythmia    afib   GERD (gastroesophageal reflux disease)    Glaucoma    right eye   Hyperlipidemia    Hypertension    Melanoma of eye ([place]) 2000's   "right; it's never been biopsied"   Melanoma of lower leg ([place]) 2015   "left; right at my knee"   Myocardial infarction ([place]) 1998   OSA (obstructive sleep apnea) 01/04/2016   no longer tolerates cpap   Peripheral vision loss, right 2006   Persistent atrial fibrillation ([place])    a. Dx 12/2012, s/p TEE/DCCV 01/26/13. b. On Xarelto (CHA2DS2VASc = 3);  c. 03/2015 TEE (EF 15-20%, no LAA thrombus) and DCCV - amio increased to 200 mg bid.   Pneumonia     Urinary hesitancy due to benign prostatic hypertrophy     Past Surgical History:  Procedure Laterality Date   ATRIAL FIBRILLATION ABLATION N/A 11/03/2019   Procedure: ATRIAL FIBRILLATION ABLATION;  Surgeon: Thompson Grayer, MD;  Location: [place] [facility];  Service: Cardiovascular;  Laterality: N/A;   BACK SURGERY     upper back, cannot turn neck well   [place] N/A 01/26/2013   Procedure: CARDIOVERSION;  Surgeon: Lelon Perla, MD;  Location: [place];  Service: Cardiovascular;  Laterality: N/A;   CARDIOVERSION N/A 03/23/2015   Procedure: CARDIOVERSION;  Surgeon: Jerline Pain, MD;  Location: [place];  Service: Cardiovascular;  Laterality: N/A;   CARDIOVERSION N/A 08/14/2017   Procedure: CARDIOVERSION;  Surgeon: Josue Hector, MD;  Location: [place];  Service: Cardiovascular;  Laterality: N/A;   CATARACT EXTRACTION Right ~ 2006   COLONOSCOPY WITH PROPOFOL N/A 02/10/2015   Procedure: COLONOSCOPY WITH PROPOFOL;  Surgeon: Gatha Mayer, MD;  Location: [facility];  Service: Endoscopy;  Laterality: N/A;   COLONOSCOPY WITH PROPOFOL N/A 08/07/2016   Procedure: COLONOSCOPY WITH PROPOFOL;  Surgeon: Gatha Mayer, MD;  Location: [facility];  Service: Endoscopy;  Laterality: N/A;   ENTEROSCOPY  N/A 08/17/2015   Procedure: ENTEROSCOPY;  Surgeon: Gatha Mayer, MD;  [facility];  Service: Endoscopy;  Laterality: N/A;   EP IMPLANTABLE DEVICE N/A 03/08/2015   MDT Hillery Aldo CRT-D for nonischemic CM by Dr Rayann Heman for primary prevention   GLAUCOMA SURGERY Right ~ 2006   "put 3 stents in to drain fluid" (03/08/2015) not successful, sent to [facility] to try to get last stent out   HOT HEMOSTASIS N/A 08/07/2016   Procedure: HOT HEMOSTASIS (ARGON PLASMA COAGULATION/BICAP);  Surgeon: Gatha Mayer, MD;  Location: [facility];  Service: Endoscopy;  Laterality: N/A;   INCISION AND DRAINAGE ABSCESS POSTERIOR CERVICALSPINE  05/2012   JOINT REPLACEMENT      MELANOMA EXCISION Left 2015   "lower leg; right at my knee"   REFRACTIVE SURGERY Right ~ 2006 X 2   "twice; both done at [facility]" (03/08/2015   SURGERY SCROTAL / TESTICULAR Right 1990's   TEE WITHOUT CARDIOVERSION N/A 01/26/2013   Procedure: TRANSESOPHAGEAL ECHOCARDIOGRAM (TEE);  Surgeon: Lelon Perla, MD;  Location: [facility];  Service: Cardiovascular;  Laterality: N/A;  [name] anes. /    TEE WITHOUT CARDIOVERSION N/A 10/05/2014   Procedure: TRANSESOPHAGEAL ECHOCARDIOGRAM (TEE)  with cardioversion;  Surgeon: Thayer Headings, MD;  Location: [facility];  Service: Cardiovascular;  Laterality: N/A;  12:52 synched cardioversion at 120 joules,...afib to SR...12 lead EKG ordered.Marland KitchenMarland KitchenCardiozem d/c'ed per MD verbal order at [facility]   TEE WITHOUT CARDIOVERSION N/A 03/23/2015   Procedure: TRANSESOPHAGEAL ECHOCARDIOGRAM (TEE);  Surgeon: Jerline Pain, MD;  Location: [place];  Service: Cardiovascular;  Laterality: N/A;   TEE WITHOUT CARDIOVERSION N/A 11/02/2019   Procedure: TRANSESOPHAGEAL ECHOCARDIOGRAM (TEE);  Surgeon: Josue Hector, MD;  Location: [facility] ENDOSCOPY;  Service: Cardiovascular;  Laterality: N/A;   THORACIC SPINE SURGERY  03/2000   "ground calcium deposits from upper thoracic" (01/26/2013)   THYROIDECTOMY N/A 05/11/2021   Procedure: TOTAL THYROIDECTOMY;  Surgeon: Armandina Gemma, MD;  Location: [facility];  Service: General;  Laterality: N/A;   TOTAL HIP ARTHROPLASTY Right 06/2007    Social History   Socioeconomic History   Marital status: Married    Spouse name: Not on file   Number of children: 3   Years of education: Not on file   Highest education level: Not on file  Occupational History   Occupation: retired  Tobacco Use   Smoking status: Former    Packs/day: 3.00    Years: 48.00    Pack years: 144.00    Types: Cigarettes    Quit date: 09/28/2000    Years since quitting: 20.8   Smokeless tobacco: Never  Vaping Use   Vaping Use: Never used  Substance and Sexual Activity   Alcohol use:  No   Drug use: No   Sexual activity: Not Currently  Other Topics Concern   Not on file  Social History Narrative   Pt lives in [place] with spouse.  3 children are grown and healthy.   Retired.  Ran a country store for 30 years, previously worked in the [employer] for 16 years   Social Determinants of Radio broadcast assistant Strain: Not on [employer] Insecurity: Not on file  Transportation Needs: No Transportation Needs   Lack of Transportation (Medical): No   Lack of Transportation (Non-Medical): No  Physical Activity: Inactive   Days of Exercise per Week: 0 days   Minutes of Exercise per Session: 0 min  Stress: Not on file  Social Connections: Not on  file  Intimate Partner Violence: Not At Risk   Fear of Current or Ex-Partner: No   Emotionally Abused: No   Physically Abused: No   Sexually Abused: No    Current Outpatient Medications on File Prior to Visit  Medication Sig Dispense Refill   calcitRIOL (ROCALTROL) 0.25 MCG capsule Take 0.25 mcg by mouth every Monday, Wednesday, and Friday.     calcium carbonate (TUMS) 500 MG chewable tablet Chew 2 tablets (400 mg of elemental calcium total) by mouth 3 (three) times daily. 90 tablet 1   carvedilol (COREG) 12.5 MG tablet Take 1 tablet (12.5 mg total) by mouth 2 (two) times daily. 180 tablet 3   dorzolamide-timolol (COSOPT) 22.3-6.8 MG/ML ophthalmic solution Place 1 drop into the right eye 2 (two) times daily.     empagliflozin (JARDIANCE) 10 MG TABS tablet Take 1 tablet (10 mg total) by mouth daily before breakfast. 90 tablet 3   ENTRESTO 24-26 MG TAKE ONE TABLET BY MOUTH TWICE DAILY (Patient taking differently: Take 0.5 tablets by mouth 2 (two) times daily.) 60 tablet 3   escitalopram (LEXAPRO) 20 MG tablet Take 1 tablet (20 mg total) by mouth daily. 90 tablet 0   ferrous sulfate 325 (65 FE) MG tablet Take 325 mg by mouth daily with breakfast.     finasteride (PROSCAR) 5 MG tablet Take 1 tablet (5 mg total) by  mouth daily. 30 tablet 2   furosemide (LASIX) 40 MG tablet Take 1 tablet (40 mg total) by mouth every morning AND 0.5 tablets (20 mg total) every evening. (Patient taking differently: Take 40 mg by mouth every morning and 20 mg every evening.) 135 tablet 3   gabapentin (NEURONTIN) 100 MG capsule Take 200 mg by mouth 2 (two) times daily.      latanoprost (XALATAN) 0.005 % ophthalmic solution Place 1 drop into the right eye at bedtime.      nitroGLYCERIN (NITROSTAT) 0.4 MG SL tablet Place 0.4 mg under the tongue every 5 (five) minutes as needed for chest pain.      Polyethyl Glycol-Propyl Glycol 0.4-0.3 % SOLN Place 1 drop into the left eye in the morning and at bedtime.     REPATHA SURECLICK 517 MG/ML SOAJ Inject 1 pen into the skin every 14 (fourteen) days. (Patient taking differently: Inject 140 mg into the skin every 14 (fourteen) days.) 2 mL 11   rivaroxaban (XARELTO) 20 MG TABS tablet Take 1 tablet (20 mg total) by mouth daily with supper. 30 tablet 2   spironolactone (ALDACTONE) 25 MG tablet Take 1 tablet (25 mg total) by mouth daily. Take at bedtime 90 tablet 3   tamsulosin (FLOMAX) 0.4 MG CAPS Take 0.4 mg by mouth at bedtime.      VITAMIN D PO Take 1 tablet by mouth daily.     traMADol (ULTRAM) 50 MG tablet Take 1-2 tablets (50-100 mg total) by mouth every 6 (six) hours as needed for moderate pain. 15 tablet 0   No current facility-administered medications on file prior to visit.    Allergies  Allergen Reactions   Pravastatin Sodium Other (See Comments)    Joint and muscle pain   Azithromycin Diarrhea    Family History  Problem Relation Age of Onset   Kidney disease Mother    Heart disease Mother        MI, open heart   Diabetes Mother        dialysis   Leukemia Father    Colon cancer Paternal Uncle  Lung cancer Paternal Uncle        x 2   Prostate cancer Paternal Uncle    Diabetes Maternal Grandmother    Heart attack Maternal Uncle    Diabetes Maternal Aunt        x 3    Diabetes Maternal Uncle    Thyroid disease Sister     BP 110/64 (BP Location: Right Arm, Patient Position: Sitting, Cuff Size: Normal)   Pulse 79   Ht 5' 6"  (1.676 m)   Wt 215 lb 3.2 oz (97.6 kg)   SpO2 95%   BMI 34.73 kg/m    Review of Systems     Objective:   Physical Exam VITAL SIGNS:  See vs page GENERAL: no distress Neck: a healed scar is present.  I do not appreciate a nodule in the thyroid or elsewhere in the neck.     outside test results are reviewed: Ca++=8.5  Lab Results  Component Value Date   TSH 9.19 (H) 08/02/2021    Assessment & Plan:  PTC: we discussed.  Pt decides to do RAI rx Hypocalcemia: recheck today Hypothyroidism: uncontrolled.  I have sent a prescription to your pharmacy, to increase synthroid.    Patient Instructions  Blood tests are requested for you today.  We'll let you know about the results.  I have requested the Radioactive iodine treatment pill.  Please see a specialist.  you will receive a phone call, about a day and time for an appointment.   Please come back for a follow-up appointment in 1 month.

## 2021-08-02 NOTE — Patient Instructions (Addendum)
Blood tests are requested for you today.  We'll let you know about the results.  I have requested the Radioactive iodine treatment pill.  Please see a specialist.  you will receive a phone call, about a day and time for an appointment.   Please come back for a follow-up appointment in 1 month.

## 2021-08-03 LAB — PTH, INTACT AND CALCIUM
Calcium: 9 mg/dL (ref 8.6–10.3)
PTH: 57 pg/mL (ref 16–77)

## 2021-08-03 MED ORDER — LEVOTHYROXINE SODIUM 125 MCG PO TABS: 125.0000 ug | ORAL_TABLET | Freq: Every day | ORAL | 3 refills | Status: DC

## 2021-08-18 ENCOUNTER — Encounter (HOSPITAL_COMMUNITY): Payer: Self-pay | Admitting: Cardiology

## 2021-08-18 ENCOUNTER — Other Ambulatory Visit: Payer: Self-pay

## 2021-08-18 ENCOUNTER — Ambulatory Visit (HOSPITAL_COMMUNITY)
Admission: RE | Admit: 2021-08-18 | Discharge: 2021-08-18 | Disposition: A | Discharge status: Home / Self Care | Payer: Medicare PPO | Source: Ambulatory Visit | Attending: Cardiology | Admitting: Cardiology

## 2021-08-18 VITALS — BP 98/58 | HR 62 | Wt 215.0 lb

## 2021-08-18 DIAGNOSIS — E785 Hyperlipidemia, unspecified: Secondary | ICD-10-CM | POA: Diagnosis not present

## 2021-08-18 DIAGNOSIS — N183 Chronic kidney disease, stage 3 unspecified: Secondary | ICD-10-CM | POA: Diagnosis not present

## 2021-08-18 DIAGNOSIS — I480 Paroxysmal atrial fibrillation: Secondary | ICD-10-CM | POA: Insufficient documentation

## 2021-08-18 DIAGNOSIS — Z95810 Presence of automatic (implantable) cardiac defibrillator: Secondary | ICD-10-CM | POA: Diagnosis not present

## 2021-08-18 DIAGNOSIS — Z9989 Dependence on other enabling machines and devices: Secondary | ICD-10-CM | POA: Diagnosis not present

## 2021-08-18 DIAGNOSIS — Z7984 Long term (current) use of oral hypoglycemic drugs: Secondary | ICD-10-CM | POA: Diagnosis not present

## 2021-08-18 DIAGNOSIS — Z7901 Long term (current) use of anticoagulants: Secondary | ICD-10-CM | POA: Diagnosis not present

## 2021-08-18 DIAGNOSIS — Z87891 Personal history of nicotine dependence: Secondary | ICD-10-CM | POA: Diagnosis not present

## 2021-08-18 DIAGNOSIS — I7389 Other specified peripheral vascular diseases: Secondary | ICD-10-CM | POA: Diagnosis not present

## 2021-08-18 DIAGNOSIS — G4733 Obstructive sleep apnea (adult) (pediatric): Secondary | ICD-10-CM | POA: Insufficient documentation

## 2021-08-18 DIAGNOSIS — Z79899 Other long term (current) drug therapy: Secondary | ICD-10-CM | POA: Diagnosis not present

## 2021-08-18 DIAGNOSIS — I5022 Chronic systolic (congestive) heart failure: Secondary | ICD-10-CM | POA: Insufficient documentation

## 2021-08-18 DIAGNOSIS — Z8249 Family history of ischemic heart disease and other diseases of the circulatory system: Secondary | ICD-10-CM | POA: Diagnosis not present

## 2021-08-18 LAB — BASIC METABOLIC PANEL
Anion gap: 8 (ref 5–15)
BUN: 23 mg/dL (ref 8–23)
CO2: 28 mmol/L (ref 22–32)
Calcium: 8.5 mg/dL — ABNORMAL LOW (ref 8.9–10.3)
Chloride: 104 mmol/L (ref 98–111)
Creatinine, Ser: 1.68 mg/dL — ABNORMAL HIGH (ref 0.61–1.24)
GFR, Estimated: 41 mL/min — ABNORMAL LOW (ref >60)
Glucose, Bld: 112 mg/dL — ABNORMAL HIGH (ref 70–99)
Potassium: 3.9 mmol/L (ref 3.5–5.1)
Sodium: 140 mmol/L (ref 135–145)

## 2021-08-18 LAB — BRAIN NATRIURETIC PEPTIDE: B Natriuretic Peptide: 235.6 pg/mL — ABNORMAL HIGH (ref 0.0–100.0)

## 2021-08-18 NOTE — Patient Instructions (Addendum)
Take medication as prescribed.  Labs done today, your results will be available in MyChart, we will contact you for abnormal readings.  Your physician recommends that you schedule a follow-up appointment in: 6 months (May 2023) **Call in March for appointment**  If you have any questions or concerns before your next appointment please send [place] a message through [place] or call our office at [phone].    TO LEAVE A MESSAGE FOR THE NURSE SELECT OPTION 2, PLEASE LEAVE A MESSAGE INCLUDING: YOUR NAME DATE OF BIRTH CALL BACK NUMBER REASON FOR CALL**this is important as we prioritize the call backs  YOU WILL RECEIVE A CALL BACK THE SAME DAY AS LONG AS YOU CALL BEFORE 4:00 PM  At the [facility], you and your health needs are our priority. As part of our continuing mission to provide you with exceptional heart care, we have created designated Provider Care Teams. These Care Teams include your primary Cardiologist (physician) and Advanced Practice Providers (APPs- Physician Assistants and Nurse Practitioners) who all work together to provide you with the care you need, when you need it.   You may see any of the following providers on your designated Care Team at your next follow up: Dr Glori Bickers Dr Haynes Kerns, NP Lyda Jester, [place] [facility] [place], [place] Audry Riles, PharmD   Please be sure to bring in all your medications bottles to every appointment.

## 2021-08-20 NOTE — Progress Notes (Signed)
Patient ID: Chad Brown, male   DOB: 01-03-1941, 80 y.o.   MRN: [id number] PCP: Ginger Organ., MD HF Cardiologist: Aundra Dubin  80 y.o. with paroxysmal atrial fibrillation and chronic systolic CHF thought to be due to nonischemic cardiomyopathy presents for followup of CHF. He has a cardiac history dating back to 1998, when he was admitted with chest pain and had cardiac cath showing nonobstructive CAD. In 4/14, he was found to be in atrial fibrillation.  Echo showed EF 30-35%.  He had DCCV back to NSR.  He was back in atrial fibrillation in 1/16, and EF was low again on TEE at that time.   Again, he had DCCV.  In 5/16, cardiac MRI showed persistently low EF, so given LBBB, he had Medtronic CRT-D device placed.  He was back in atrial fibrillation in 6/16 and had TEE-guided DCCV again with EF now 15-20% on TEE.  Repeat echo in 2/17 showed EF 55-60%. Echo in 1/19 showed EF 45-50%, mild LV and RV dilation.   Echo in 1/20 showed EF 45-50%, diffuse hypokinesis.   Atrial fibrillation ablation was done in 2/21.  Echo in 2/21 showed EF 40-45%, PASP 50 mmHg, mild MR.   Today he returns for HF follow up.  He has OSA but cannot tolerate CPAP.  No dyspnea walking on flat ground.  He gets tired walking up stairs.  No chest pain.  No falls.  Occasional mild lightheadedness when he first stands.  He does have generalized fatigue.   Medtronic device interrogation: >99% BiV pacing, stable thoracic impedance.   Labs (6/16): LDL 153 Labs (7/16): HCT 40, LFTs normal, LFTs normal Labs (9/16): TSH normal, K 4.3, creatinine 1.03, BNP 419 Labs (10/17): K 4.2, creatinine 1.29, LFTs normal Labs (11/17): hgb 8.9 Labs (6/19): BNP 625 Labs (10/19): K 4.7, creatinine 1.66 => 1.58, LFTs normal Labs (1/20): LDL 128 Labs (11/20): K 3.6, creatinine 1.62 Labs (1/21): K 3.8, creatinine 1.89, BNP 578, LDL 115, TSH normal, LFTs normal Labs (3/21): K 4.7, creatinine 2.12 Labs (1/22): K 3.3, creatinine 1.34 Labs (5/22): LDL  42 Labs (8/22): K 3.8, creatinine 1.45  PMH: 1. Atrial fibrillation: Paroxysmal.  Diagnosed 4/14, DCCV 4/14 to NSR.  DCCV 1/16 to NSR.  DCCV 6/16 to NSR.  2. Chronic systolic CHF: Nonischemic cardiomyopathy.  LHC in 1998 with nonobstructive disease.  Cardiolite in 6/14 with no ischemia or infarction.  cMRI (5/16) with EF 34%, mildly dilated LV with diffuse HK worse in anterolateral wall, small punctate areas of LGE in anteroseptum and basal inferior wall (not CAD pattern).  TEE (6/16) with EF 15-20%.  He has Medtronic CRT-D device.  Echo (9/16) with EF 40%, moderate LV dilation, grade II diastolic dysfunction, normal RV size and systolic function, PASP 42 mmHg.  - Hypotension with Entresto.  - Echo (2/17) showed LV functional recovery, EF 55-60% with mild MR.    - Echo (1/19): EF 45-50%, mild LV dilation, mild RV dilation - Echo (1/20): EF 45-50%, diffuse hypokinesis, moderate diastolic dysfunction, normal RV size and systolic function.  - Echo (2/21): EF 40-45%, PASP 50 mmHg, mild MR.  - Echo (6/22): EF 40-45%, moderate LV dilation, mild LVH, RV normal 3. Hyperlipidemia: Myalgias with atorvastatin, Livalo, and pravastatin.  4. CKD stage 3 5. OA: s/p THR.  6. H/o melanoma 7. Anemia 8. Diverticulosis 9. OSA: Uses CPAP.  10. Pulmonary nodules: Followed by Dr. Delton Coombes.  11. Pancreatic pseudocysts 12. PAD: ABIs (2/20) were 0.94 on right and 1.03 on  left.  13. Papillary thyroid carcinoma: s/p thyroidectomy 8/22.   SH: Married with 3 children, lives in [place], retired, quit smoking in 2001.    FH: Mother with MI  ROS: All systems reviewed and negative except as per HPI.  Current Outpatient Medications  Medication Sig Dispense Refill   calcitRIOL (ROCALTROL) 0.25 MCG capsule Take 0.25 mcg by mouth every Monday, Wednesday, and Friday.     calcium carbonate (TUMS - DOSED IN MG ELEMENTAL CALCIUM) 500 MG chewable tablet Chew 1 tablet by mouth daily.     carvedilol (COREG) 12.5 MG  tablet Take 1 tablet (12.5 mg total) by mouth 2 (two) times daily. 180 tablet 3   dorzolamide-timolol (COSOPT) 22.3-6.8 MG/ML ophthalmic solution Place 1 drop into the right eye 2 (two) times daily.     empagliflozin (JARDIANCE) 10 MG TABS tablet Take 1 tablet (10 mg total) by mouth daily before breakfast. 90 tablet 3   escitalopram (LEXAPRO) 20 MG tablet Take 1 tablet (20 mg total) by mouth daily. 90 tablet 0   ferrous sulfate 325 (65 FE) MG tablet Take 325 mg by mouth daily with breakfast.     finasteride (PROSCAR) 5 MG tablet Take 1 tablet (5 mg total) by mouth daily. 30 tablet 2   furosemide (LASIX) 40 MG tablet Take 1 tablet (40 mg total) by mouth every morning AND 0.5 tablets (20 mg total) every evening. 135 tablet 3   gabapentin (NEURONTIN) 100 MG capsule Take 200 mg by mouth 2 (two) times daily.      latanoprost (XALATAN) 0.005 % ophthalmic solution Place 1 drop into the right eye at bedtime.     levothyroxine (SYNTHROID) 125 MCG tablet Take 1 tablet (125 mcg total) by mouth daily. 90 tablet 3   nitroGLYCERIN (NITROSTAT) 0.4 MG SL tablet Place 0.4 mg under the tongue every 5 (five) minutes as needed for chest pain.      Polyethyl Glycol-Propyl Glycol 0.4-0.3 % SOLN Place 1 drop into the left eye in the morning and at bedtime.     REPATHA SURECLICK 761 MG/ML SOAJ Inject 1 pen into the skin every 14 (fourteen) days. 2 mL 11   rivaroxaban (XARELTO) 20 MG TABS tablet Take 1 tablet (20 mg total) by mouth daily with supper. 30 tablet 2   sacubitril-valsartan (ENTRESTO) 24-26 MG Take 0.5 tablets by mouth 2 (two) times daily.     spironolactone (ALDACTONE) 25 MG tablet Take 1 tablet (25 mg total) by mouth daily. Take at bedtime 90 tablet 3   tamsulosin (FLOMAX) 0.4 MG CAPS Take 0.4 mg by mouth at bedtime.      VITAMIN D PO Take 1 tablet by mouth daily.     No current facility-administered medications for this encounter.   BP (!) 98/58   Pulse 62   Wt 97.5 kg (215 lb)   SpO2 98%   BMI 34.70  kg/m  General: NAD Neck: No JVD, no thyromegaly or thyroid nodule.  Lungs: Clear to auscultation bilaterally with normal respiratory effort. CV: Nondisplaced PMI.  Heart regular S1/S2, no S3/S4, no murmur.  No peripheral edema.  No carotid bruit.  Normal pedal pulses.  Abdomen: Soft, nontender, no hepatosplenomegaly, no distention.  Skin: Intact without lesions or rashes.  Neurologic: Alert and oriented x 3.  Psych: Normal affect. Extremities: No clubbing or cyanosis.  HEENT: Normal.   Assessment/Plan: 1. Chronic systolic CHF: Suspect nonischemic cardiomyopathy.  EF 15-20% on 6/16 TEE.  Cardiac MRI in 5/16 showed a LGE pattern  that was not suggestive of coronary disease (?myocarditis or infiltrative disease).  There may be a component of tachycardia-mediated cardiomyopathy.  However, the cardiac MRI appears to have been done when he was in NSR.  In 9/16, echo showed EF increased to 40%.  Echo in 1/20 showed EF 45-50%.  He has Medtronic CRT-D device.  Echo (2/21) with EF 40-45%.  Echo in 03/2021 EF 40-45%.  NYHA class II symptoms. He is not volume overloaded on exam. Orthostatic symptoms has limited GDMT.  - Continue Jardiance 10 mg daily - Continue  Lasix to 40 qam/20 qpm.  BMET/BNP today.  - Continue spironolactone to 25 mg daily.      - Continue Entresto 0.5 tab 24/26 bid. No BP room to increase.  - Continue current Coreg, no BP room to increase.    2. Atrial fibrillation: Poorly tolerated.  Now s/p atrial fibrillation ablation in 2/21 and off amiodarone.  No recent atrial fibrillation by device check.   - Continue Xarelto 15 mg daily.  3. Hyperlipidemia: Continue Repatha, good lipids in 5/22.  4. OSA: Continue CPAP.   5. PAD: Minimal claudication.     Followup in 6 months.   Loralie Champagne  08/20/2021

## 2021-08-21 ENCOUNTER — Telehealth: Payer: Self-pay | Admitting: Endocrinology

## 2021-08-21 ENCOUNTER — Ambulatory Visit (INDEPENDENT_AMBULATORY_CARE_PROVIDER_SITE_OTHER): Payer: Medicare PPO

## 2021-08-21 DIAGNOSIS — Z95810 Presence of automatic (implantable) cardiac defibrillator: Secondary | ICD-10-CM | POA: Diagnosis not present

## 2021-08-21 DIAGNOSIS — I5022 Chronic systolic (congestive) heart failure: Secondary | ICD-10-CM

## 2021-08-22 ENCOUNTER — Telehealth: Payer: Self-pay

## 2021-08-22 DIAGNOSIS — H401113 Primary open-angle glaucoma, right eye, severe stage: Secondary | ICD-10-CM | POA: Diagnosis not present

## 2021-08-22 DIAGNOSIS — D3191 Benign neoplasm of unspecified part of right eye: Secondary | ICD-10-CM | POA: Diagnosis not present

## 2021-08-22 NOTE — Progress Notes (Signed)
EPIC Encounter for ICM Monitoring  Patient Name: Chad Brown is a 80 y.o. male Date: 08/22/2021 Primary Care Physican: Ginger Organ., MD Primary Cardiologist: Nishan/McLean Electrophysiologist: Allred Bi-V Pacing: 98.2%   07/28/2021 Weight: 212 lbs    Time in AT/AF   0.0 hr/day (0.0%) (taking Xarelto)   Attempted call to patient and unable to reach.  Left detailed message per DPR regarding transmission. Transmission reviewed.    Optivol thoracic impedance suggesting normal fluid levels.   Prescribed:  Furosemide 40 mg 1 tablet (40 mg total) by mouth in the morning and 0.5 tablet (20 mg total)  every evening. Spironolactone 25 mg Take 1 tablet (25 mg total) by mouth daily. Take at bedtime   Labs: 05/01/2021 Creatinine 1.43, BUN 24, Potassium 4.6, Sodium 142, GFR 50 03/09/2021 Creatinine 1.40, BUN 18, Potassium 3.7, Sodium 139, GFR 51 02/17/2021 Creatinine 1.64, BUN 17, Potassium 3.9, Sodium 139, GFR 42 02/07/2021 Creatinine 1.43, BUN 14,Potassium 3.8, Sodium 141,  GFR 50  10/27/2020 Creatinine 1.34, BUN 17, Potassium 3.3, Sodium 137, GFR 54 A complete set of results can be found in Results Review.   Recommendations:  Unable to reach.     Follow-up plan: ICM clinic phone appointment on 10/03/2021.  91 day device clinic remote transmission 08/31/2021.     EP/Cardiology Next Office Visit:  02/17/2022 with Dr Aundra Dubin.     Recall 04/10/2022 with Tommye Standard, PA.     Copy of ICM check sent to Dr. Rayann Heman.     3 month ICM trend: 08/21/2021.    12-14 Month ICM trend:       Rosalene Billings, RN 08/22/2021 10:09 AM

## 2021-08-22 NOTE — Telephone Encounter (Signed)
Remote ICM transmission received.  Attempted call to patient regarding ICM remote transmission and left detailed message per DPR.  Advised to return call for any fluid symptoms or questions. Next ICM remote transmission scheduled 10/03/2021.

## 2021-08-30 ENCOUNTER — Telehealth: Payer: Self-pay | Admitting: Endocrinology

## 2021-08-30 NOTE — Telephone Encounter (Signed)
Patient requests to be called at ph# [phone] re: Patient has not received any calls from Radiology or anyone concerning the appointments Dr. Loanne Drilling told Patient he was requesting Patient have. Patient states he is having an annual CT scan done at [facility] on 09/07/21.

## 2021-08-31 ENCOUNTER — Ambulatory Visit (INDEPENDENT_AMBULATORY_CARE_PROVIDER_SITE_OTHER): Payer: Medicare PPO

## 2021-08-31 DIAGNOSIS — I428 Other cardiomyopathies: Secondary | ICD-10-CM

## 2021-08-31 DIAGNOSIS — I5022 Chronic systolic (congestive) heart failure: Secondary | ICD-10-CM | POA: Diagnosis not present

## 2021-08-31 DIAGNOSIS — Z1152 Encounter for screening for COVID-19: Secondary | ICD-10-CM | POA: Diagnosis not present

## 2021-08-31 DIAGNOSIS — R0602 Shortness of breath: Secondary | ICD-10-CM | POA: Diagnosis not present

## 2021-08-31 DIAGNOSIS — J159 Unspecified bacterial pneumonia: Secondary | ICD-10-CM | POA: Diagnosis not present

## 2021-08-31 DIAGNOSIS — I130 Hypertensive heart and chronic kidney disease with heart failure and stage 1 through stage 4 chronic kidney disease, or unspecified chronic kidney disease: Secondary | ICD-10-CM | POA: Diagnosis not present

## 2021-08-31 DIAGNOSIS — J029 Acute pharyngitis, unspecified: Secondary | ICD-10-CM | POA: Diagnosis not present

## 2021-08-31 DIAGNOSIS — R0981 Nasal congestion: Secondary | ICD-10-CM | POA: Diagnosis not present

## 2021-08-31 LAB — CUP PACEART REMOTE DEVICE CHECK
Battery Remaining Longevity: 20 mo
Battery Voltage: 2.86 V
Brady Statistic AP VP Percent: 92.81 %
Brady Statistic AP VS Percent: 0.12 %
Brady Statistic AS VP Percent: 7 %
Brady Statistic AS VS Percent: 0.07 %
Brady Statistic RA Percent Paced: 91.14 %
Brady Statistic RV Percent Paced: 98.08 %
Date Time Interrogation Session: 20221201022705
HighPow Impedance: 74 Ohm
Implantable Lead Implant Date: 20160607
Implantable Lead Implant Date: 20160607
Implantable Lead Implant Date: 20160607
Implantable Lead Location: 753858
Implantable Lead Location: 753859
Implantable Lead Location: 753860
Implantable Lead Model: 4598
Implantable Lead Model: 5076
Implantable Pulse Generator Implant Date: 20160607
Lead Channel Impedance Value: 1026 Ohm
Lead Channel Impedance Value: 1102 Ohm
Lead Channel Impedance Value: 1178 Ohm
Lead Channel Impedance Value: 1273 Ohm
Lead Channel Impedance Value: 1368 Ohm
Lead Channel Impedance Value: 1463 Ohm
Lead Channel Impedance Value: 342 Ohm
Lead Channel Impedance Value: 456 Ohm
Lead Channel Impedance Value: 513 Ohm
Lead Channel Impedance Value: 570 Ohm
Lead Channel Impedance Value: 722 Ohm
Lead Channel Impedance Value: 779 Ohm
Lead Channel Impedance Value: 893 Ohm
Lead Channel Pacing Threshold Amplitude: 0.5 V
Lead Channel Pacing Threshold Amplitude: 0.5 V
Lead Channel Pacing Threshold Amplitude: 1.5 V
Lead Channel Pacing Threshold Pulse Width: 0.4 ms
Lead Channel Pacing Threshold Pulse Width: 0.4 ms
Lead Channel Pacing Threshold Pulse Width: 0.4 ms
Lead Channel Sensing Intrinsic Amplitude: 2.375 mV
Lead Channel Sensing Intrinsic Amplitude: 2.375 mV
Lead Channel Sensing Intrinsic Amplitude: 20.5 mV
Lead Channel Sensing Intrinsic Amplitude: 20.5 mV
Lead Channel Setting Pacing Amplitude: 1.5 V
Lead Channel Setting Pacing Amplitude: 2 V
Lead Channel Setting Pacing Amplitude: 2 V
Lead Channel Setting Pacing Pulse Width: 0.4 ms
Lead Channel Setting Pacing Pulse Width: 0.4 ms
Lead Channel Setting Sensing Sensitivity: 0.3 mV

## 2021-09-01 ENCOUNTER — Ambulatory Visit: Payer: Medicare PPO | Admitting: Endocrinology

## 2021-09-01 NOTE — Telephone Encounter (Signed)
Spoke with pt and someone over at [place] at [facility] and they said that the order has been sign and they should be contacting him soon to get scheduled.

## 2021-09-04 ENCOUNTER — Other Ambulatory Visit: Payer: Self-pay | Admitting: Internal Medicine

## 2021-09-04 NOTE — Telephone Encounter (Signed)
Pt last saw Dr Aundra Dubin 08/18/21, last labs 08/18/21 Creat 1.68, age 80, weight 97.5kg, CrCl 48.36, based on CrCl pt is not on appropriate dosage of Xarelto 20mg  QD for afib.  Last refill 06/06/21 dosage was increased from 15mg  to 20mg  secondary to CrCl 56.44. May need to change dosage back to 15mg  daily. Discussed dosing with Marcelle Overlie, pharmacist pt is scheduled for 09/07/21 for labwork at [facility], will look for most recent labwork on Friday and calculate CrCl from that and send in appropriate dosage of Xarelto. Called spoke with pt he has 3 tablets of Xarelto left, but the pharmacist will give him 2 or 3 if he runs out.  Explained we may need to change the dosage of Xarelto based on this most recent labwork.  Will send reminder to look for lab results on Friday 09/08/21 will need rx refill sent in by then.

## 2021-09-06 NOTE — Telephone Encounter (Signed)
NA

## 2021-09-07 ENCOUNTER — Other Ambulatory Visit: Payer: Self-pay

## 2021-09-07 ENCOUNTER — Inpatient Hospital Stay (HOSPITAL_COMMUNITY): Payer: Medicare PPO | Attending: Hematology

## 2021-09-07 DIAGNOSIS — Z85820 Personal history of malignant melanoma of skin: Secondary | ICD-10-CM | POA: Diagnosis not present

## 2021-09-07 DIAGNOSIS — Z87891 Personal history of nicotine dependence: Secondary | ICD-10-CM | POA: Insufficient documentation

## 2021-09-07 DIAGNOSIS — I4891 Unspecified atrial fibrillation: Secondary | ICD-10-CM | POA: Diagnosis not present

## 2021-09-07 DIAGNOSIS — C439 Malignant melanoma of skin, unspecified: Secondary | ICD-10-CM

## 2021-09-07 DIAGNOSIS — I110 Hypertensive heart disease with heart failure: Secondary | ICD-10-CM | POA: Insufficient documentation

## 2021-09-07 DIAGNOSIS — D696 Thrombocytopenia, unspecified: Secondary | ICD-10-CM | POA: Insufficient documentation

## 2021-09-07 DIAGNOSIS — D649 Anemia, unspecified: Secondary | ICD-10-CM | POA: Diagnosis not present

## 2021-09-07 DIAGNOSIS — Z7901 Long term (current) use of anticoagulants: Secondary | ICD-10-CM | POA: Diagnosis not present

## 2021-09-07 DIAGNOSIS — N189 Chronic kidney disease, unspecified: Secondary | ICD-10-CM | POA: Diagnosis not present

## 2021-09-07 DIAGNOSIS — Z7984 Long term (current) use of oral hypoglycemic drugs: Secondary | ICD-10-CM | POA: Diagnosis not present

## 2021-09-07 DIAGNOSIS — C73 Malignant neoplasm of thyroid gland: Secondary | ICD-10-CM | POA: Insufficient documentation

## 2021-09-07 DIAGNOSIS — Z79899 Other long term (current) drug therapy: Secondary | ICD-10-CM | POA: Diagnosis not present

## 2021-09-07 DIAGNOSIS — I5022 Chronic systolic (congestive) heart failure: Secondary | ICD-10-CM | POA: Insufficient documentation

## 2021-09-07 DIAGNOSIS — R918 Other nonspecific abnormal finding of lung field: Secondary | ICD-10-CM | POA: Diagnosis not present

## 2021-09-07 LAB — COMPREHENSIVE METABOLIC PANEL
ALT: 20 U/L (ref 0–44)
AST: 15 U/L (ref 15–41)
Albumin: 3.3 g/dL — ABNORMAL LOW (ref 3.5–5.0)
Alkaline Phosphatase: 60 U/L (ref 38–126)
Anion gap: 7 (ref 5–15)
BUN: 28 mg/dL — ABNORMAL HIGH (ref 8–23)
CO2: 29 mmol/L (ref 22–32)
Calcium: 8.8 mg/dL — ABNORMAL LOW (ref 8.9–10.3)
Chloride: 102 mmol/L (ref 98–111)
Creatinine, Ser: 1.9 mg/dL — ABNORMAL HIGH (ref 0.61–1.24)
GFR, Estimated: 35 mL/min — ABNORMAL LOW (ref >60)
Glucose, Bld: 84 mg/dL (ref 70–99)
Potassium: 4.2 mmol/L (ref 3.5–5.1)
Sodium: 138 mmol/L (ref 135–145)
Total Bilirubin: 0.2 mg/dL — ABNORMAL LOW (ref 0.3–1.2)
Total Protein: 6.7 g/dL (ref 6.5–8.1)

## 2021-09-07 LAB — CBC WITH DIFFERENTIAL/PLATELET
Abs Immature Granulocytes: 0.09 10*3/uL — ABNORMAL HIGH (ref 0.00–0.07)
Basophils Absolute: 0.1 10*3/uL (ref 0.0–0.1)
Basophils Relative: 1 %
Eosinophils Absolute: 0.1 10*3/uL (ref 0.0–0.5)
Eosinophils Relative: 2 %
HCT: 34.5 % — ABNORMAL LOW (ref 39.0–52.0)
Hemoglobin: 11.6 g/dL — ABNORMAL LOW (ref 13.0–17.0)
Immature Granulocytes: 1 %
Lymphocytes Relative: 19 %
Lymphs Abs: 1.5 10*3/uL (ref 0.7–4.0)
MCH: 33.2 pg (ref 26.0–34.0)
MCHC: 33.6 g/dL (ref 30.0–36.0)
MCV: 98.9 fL (ref 80.0–100.0)
Monocytes Absolute: 0.7 10*3/uL (ref 0.1–1.0)
Monocytes Relative: 9 %
Neutro Abs: 5.5 10*3/uL (ref 1.7–7.7)
Neutrophils Relative %: 68 %
Platelets: 182 10*3/uL (ref 150–400)
RBC: 3.49 MIL/uL — ABNORMAL LOW (ref 4.22–5.81)
RDW: 15.6 % — ABNORMAL HIGH (ref 11.5–15.5)
WBC: 7.9 10*3/uL (ref 4.0–10.5)
nRBC: 0 % (ref 0.0–0.2)

## 2021-09-07 LAB — IRON AND TIBC
Iron: 57 ug/dL (ref 45–182)
Saturation Ratios: 23 % (ref 17.9–39.5)
TIBC: 250 ug/dL (ref 250–450)
UIBC: 193 ug/dL

## 2021-09-07 LAB — FERRITIN: Ferritin: 300 ng/mL (ref 24–336)

## 2021-09-07 LAB — LACTATE DEHYDROGENASE: LDH: 112 U/L (ref 98–192)

## 2021-09-07 MED ORDER — RIVAROXABAN 15 MG PO TABS: 15.0000 mg | ORAL_TABLET | Freq: Every day | ORAL | 1 refills | Status: DC

## 2021-09-07 NOTE — Telephone Encounter (Signed)
Prescription refill request for Xarelto received.  Indication: Afib  Last office visit: 08/18/21 Aundra Dubin)  Weight: 97.5kg Age: 80  Scr: 1.90 (09/07/21)  CrCl: 42.23ml/min  Per Marcelle Overlie, Pharm D, pt requires dose decrease due to CrCl. Dose decreased to 15mg  daily. Appropriate dose and refill sent to requested pharmacy.

## 2021-09-07 NOTE — Addendum Note (Signed)
Addended by: Leonidas Romberg on: 09/07/2021 04:35 PM   Modules accepted: Orders

## 2021-09-11 ENCOUNTER — Other Ambulatory Visit: Payer: Self-pay | Admitting: Endocrinology

## 2021-09-11 DIAGNOSIS — D440 Neoplasm of uncertain behavior of thyroid gland: Secondary | ICD-10-CM

## 2021-09-11 NOTE — Progress Notes (Signed)
Remote ICD transmission.   

## 2021-09-13 ENCOUNTER — Ambulatory Visit: Payer: Medicare PPO | Admitting: Endocrinology

## 2021-09-13 NOTE — Progress Notes (Signed)
Chad Brown, [place] [address]   CLINIC:  Medical Oncology/Hematology  PCP:  Ginger Organ., MD [address] / [place] [address] [phone]   REASON FOR VISIT:  Follow-up for lung nodules, melanoma of left thigh and thrombocytopenia  PRIOR THERAPY: Melanoma resection in 2015  NGS Results: not done  CURRENT THERAPY: surveillance  BRIEF ONCOLOGIC HISTORY:  Oncology History   No history exists.    CANCER STAGING:  Cancer Staging  No matching staging information was found for the patient.  INTERVAL HISTORY:  Chad Brown, a 80 y.o. male, returns for routine follow-up of his lung nodules, melanoma of left thigh and thrombocytopenia. Chad Brown was last seen on 03/16/2021.  Today he reports feeling good. He reports he takes a 9 mg iron gummy daily. His sister had thyroid cancer. He was diagnosed with pneumonia 3 weeks ago. 2 paternal aunts had lung cancer, and a paternal uncle had lung cancer. His father had leukemia.   REVIEW OF SYSTEMS:  Review of Systems  Constitutional:  Negative for appetite change and fatigue (50%).  Respiratory:  Positive for cough.   Gastrointestinal:  Positive for diarrhea.  Neurological:  Positive for dizziness and numbness.  All other systems reviewed and are negative.  PAST MEDICAL/SURGICAL HISTORY:  Past Medical History:  Diagnosis Date   Adenomatous colon polyp    tubular   AICD (automatic cardioverter/defibrillator) present 03/08/2015   MDT CRTD dual pacemaker and defib   Anemia    iron deficient   Arthritis    "about all my joints; hands, knees, back" (03/08/2015)   Atherosclerosis    Cataract    left eye small   Cholelithiasis    gallstones   Chronic systolic CHF (congestive heart failure) ([place])    a. New dx 12/2012 ? NICM, may be r/t afib. b. Nuc 03/2013 - normal;  c. 03/2015 TEE EF 15-20%.   Colon polyp, hyperplastic 01/2015   removed precancerous lesions   Depression     Diverticulosis    Dysrhythmia    afib   GERD (gastroesophageal reflux disease)    Glaucoma    right eye   Hyperlipidemia    Hypertension    Melanoma of eye ([place]) 2000's   "right; it's never been biopsied"   Melanoma of lower leg ([place]) 2015   "left; right at my knee"   Myocardial infarction ([place]) 1998   OSA (obstructive sleep apnea) 01/04/2016   no longer tolerates cpap   Peripheral vision loss, right 2006   Persistent atrial fibrillation ([place])    a. Dx 12/2012, s/p TEE/DCCV 01/26/13. b. On Xarelto (CHA2DS2VASc = 3);  c. 03/2015 TEE (EF 15-20%, no LAA thrombus) and DCCV - amio increased to 200 mg bid.   Pneumonia    Urinary hesitancy due to benign prostatic hypertrophy    Past Surgical History:  Procedure Laterality Date   ATRIAL FIBRILLATION ABLATION N/A 11/03/2019   Procedure: ATRIAL FIBRILLATION ABLATION;  Surgeon: Thompson Grayer, MD;  Location: [facility];  Service: Cardiovascular;  Laterality: N/A;   BACK SURGERY     upper back, cannot turn neck well   [place] N/A 01/26/2013   Procedure: CARDIOVERSION;  Surgeon: Lelon Perla, MD;  Location: [facility];  Service: Cardiovascular;  Laterality: N/A;   CARDIOVERSION N/A 03/23/2015   Procedure: CARDIOVERSION;  Surgeon: Jerline Pain, MD;  Location: [facility];  Service: Cardiovascular;  Laterality: N/A;  CARDIOVERSION N/A 08/14/2017   Procedure: CARDIOVERSION;  Surgeon: Josue Hector, MD;  Location: [facility] ENDOSCOPY;  Service: Cardiovascular;  Laterality: N/A;   CATARACT EXTRACTION Right ~ 2006   COLONOSCOPY WITH PROPOFOL N/A 02/10/2015   Procedure: COLONOSCOPY WITH PROPOFOL;  Surgeon: Gatha Mayer, MD;  Location: [facility]OSCOPY;  Service: Endoscopy;  Laterality: N/A;   COLONOSCOPY WITH PROPOFOL N/A 08/07/2016   Procedure: COLONOSCOPY WITH PROPOFOL;  Surgeon: Gatha Mayer, MD;  Location: [facility] ENDOSCOPY;  Service: Endoscopy;  Laterality: N/A;   ENTEROSCOPY N/A 08/17/2015   Procedure:  ENTEROSCOPY;  Surgeon: Gatha Mayer, MD;  Location: [facility]OS[facility];  Service: Endoscopy;  Laterality: N/A;   EP IMPLANTABLE DEVICE N/A 03/08/2015   MDT Hillery Aldo CRT-D for nonischemic CM by Dr Rayann Heman for primary prevention   GLAUCOMA SURGERY Right ~ 2006   "put 3 stents in to drain fluid" (03/08/2015) not successful, sent to [facility] to try to get last stent out   HOT HEMOSTASIS N/A 08/07/2016   Procedure: HOT HEMOSTASIS (ARGON PLASMA COAGULATION/BICAP);  Surgeon: Gatha Mayer, MD;  Location: [facility];  Service: Endoscopy;  Laterality: N/A;   INCISION AND DRAINAGE ABSCESS POSTERIOR CERVICALSPINE  05/2012   JOINT REPLACEMENT     MELANOMA EXCISION Left 2015   "lower leg; right at my knee"   REFRACTIVE SURGERY Right ~ 2006 X 2   "twice; both done at [facility]" (03/08/2015   SURGERY SCROTAL / TESTICULAR Right 1990's   TEE WITHOUT CARDIOVERSION N/A 01/26/2013   Procedure: TRANSESOPHAGEAL ECHOCARDIOGRAM (TEE);  Surgeon: Lelon Perla, MD;  Location: [facility] ENDOSCOPY;  Service: Cardiovascular;  Laterality: N/A;  [name] anes. /    TEE WITHOUT CARDIOVERSION N/A 10/05/2014   Procedure: TRANSESOPHAGEAL ECHOCARDIOGRAM (TEE)  with cardioversion;  Surgeon: Thayer Headings, MD;  Location: [facility] ENDOSCOPY;  Service: Cardiovascular;  Laterality: N/A;  12:52 synched cardioversion at 120 joules,...afib to SR...12 lead EKG ordered.Marland KitchenMarland KitchenCardiozem d/c'ed per MD verbal order at SR   TEE WITHOUT CARDIOVERSION N/A 03/23/2015   Procedure: TRANSESOPHAGEAL ECHOCARDIOGRAM (TEE);  Surgeon: Jerline Pain, MD;  Location: [facility];  Service: Cardiovascular;  Laterality: N/A;   TEE WITHOUT CARDIOVERSION N/A 11/02/2019   Procedure: TRANSESOPHAGEAL ECHOCARDIOGRAM (TEE);  Surgeon: Josue Hector, MD;  Location: [facility] ENDOSCOPY;  Service: Cardiovascular;  Laterality: N/A;   THORACIC SPINE SURGERY  03/2000   "ground calcium deposits from upper thoracic" (01/26/2013)   THYROIDECTOMY N/A 05/11/2021   Procedure: TOTAL THYROIDECTOMY;  Surgeon: Armandina Gemma,  MD;  Location: [facility] ORS;  Service: General;  Laterality: N/A;   TOTAL HIP ARTHROPLASTY Right 06/2007    SOCIAL HISTORY:  Social History   Socioeconomic History   Marital status: Married    Spouse name: Not on file   Number of children: 3   Years of education: Not on file   Highest education level: Not on file  Occupational History   Occupation: retired  Tobacco Use   Smoking status: Former    Packs/day: 3.00    Years: 48.00    Pack years: 144.00    Types: Cigarettes    Quit date: 09/28/2000    Years since quitting: 20.9   Smokeless tobacco: Never  Vaping Use   Vaping Use: Never used  Substance and Sexual Activity   Alcohol use: No   Drug use: No   Sexual activity: Not Currently  Other Topics Concern   Not on file  Social History Narrative   Pt lives in [place] with spouse.  3 children are grown  and healthy.   Retired.  Ran a country store for 30 years, previously worked in the [employer] for 16 years   Social Determinants of Radio broadcast assistant Strain: Not on [employer] Insecurity: Not on file  Transportation Needs: No Transportation Needs   Lack of Transportation (Medical): No   Lack of Transportation (Non-Medical): No  Physical Activity: Inactive   Days of Exercise per Week: 0 days   Minutes of Exercise per Session: 0 min  Stress: Not on file  Social Connections: Not on file  Intimate Partner Violence: Not At Risk   Fear of Current or Ex-Partner: No   Emotionally Abused: No   Physically Abused: No   Sexually Abused: No    FAMILY HISTORY:  Family History  Problem Relation Age of Onset   Kidney disease Mother    Heart disease Mother        MI, open heart   Diabetes Mother        dialysis   Leukemia Father    Colon cancer Paternal Uncle    Lung cancer Paternal Uncle        x 2   Prostate cancer Paternal Uncle    Diabetes Maternal Grandmother    Heart attack Maternal Uncle    Diabetes Maternal Aunt        x 3   Diabetes Maternal  Uncle    Thyroid disease Sister     CURRENT MEDICATIONS:  Current Outpatient Medications  Medication Sig Dispense Refill   calcitRIOL (ROCALTROL) 0.25 MCG capsule Take 0.25 mcg by mouth every Monday, Wednesday, and Friday.     calcium carbonate (TUMS - DOSED IN MG ELEMENTAL CALCIUM) 500 MG chewable tablet Chew 1 tablet by mouth daily.     carvedilol (COREG) 12.5 MG tablet Take 1 tablet (12.5 mg total) by mouth 2 (two) times daily. 180 tablet 3   dorzolamide-timolol (COSOPT) 22.3-6.8 MG/ML ophthalmic solution Place 1 drop into the right eye 2 (two) times daily.     empagliflozin (JARDIANCE) 10 MG TABS tablet Take 1 tablet (10 mg total) by mouth daily before breakfast. 90 tablet 3   escitalopram (LEXAPRO) 20 MG tablet Take 1 tablet (20 mg total) by mouth daily. 90 tablet 0   ferrous sulfate 325 (65 FE) MG tablet Take 325 mg by mouth daily with breakfast.     finasteride (PROSCAR) 5 MG tablet Take 1 tablet (5 mg total) by mouth daily. 30 tablet 2   furosemide (LASIX) 40 MG tablet Take 1 tablet (40 mg total) by mouth every morning AND 0.5 tablets (20 mg total) every evening. 135 tablet 3   gabapentin (NEURONTIN) 100 MG capsule Take 200 mg by mouth 2 (two) times daily.      latanoprost (XALATAN) 0.005 % ophthalmic solution Place 1 drop into the right eye at bedtime.     levothyroxine (SYNTHROID) 125 MCG tablet Take 1 tablet (125 mcg total) by mouth daily. 90 tablet 3   nitroGLYCERIN (NITROSTAT) 0.4 MG SL tablet Place 0.4 mg under the tongue every 5 (five) minutes as needed for chest pain.  (Patient not taking: Reported on 09/14/2021)     Polyethyl Glycol-Propyl Glycol 0.4-0.3 % SOLN Place 1 drop into the left eye in the morning and at bedtime.     REPATHA SURECLICK 220 MG/ML SOAJ Inject 1 pen into the skin every 14 (fourteen) days. 2 mL 11   Rivaroxaban (XARELTO) 15 MG TABS tablet Take 1 tablet (15 mg total) by mouth  daily with supper. 90 tablet 1   sacubitril-valsartan (ENTRESTO) 24-26 MG Take 0.5  tablets by mouth 2 (two) times daily.     spironolactone (ALDACTONE) 25 MG tablet Take 1 tablet (25 mg total) by mouth daily. Take at bedtime 90 tablet 3   tamsulosin (FLOMAX) 0.4 MG CAPS Take 0.4 mg by mouth at bedtime.      VITAMIN D PO Take 1 tablet by mouth daily.     No current facility-administered medications for this visit.    ALLERGIES:  Allergies  Allergen Reactions   Pravastatin Sodium Other (See Comments)    Joint and muscle pain   Azithromycin Diarrhea    PHYSICAL EXAM:  Performance status (ECOG): 1 - Symptomatic but completely ambulatory  Vitals:   09/14/21 1510  BP: 106/76  Pulse: 64  Resp: 18  Temp: 98.2 F (36.8 C)  SpO2: 98%   Wt Readings from Last 3 Encounters:  09/14/21 214 lb (97.1 kg)  08/18/21 215 lb (97.5 kg)  08/02/21 215 lb 3.2 oz (97.6 kg)   Physical Exam Vitals reviewed.  Constitutional:      Appearance: Normal appearance. He is obese.  Cardiovascular:     Rate and Rhythm: Normal rate and regular rhythm.     Pulses: Normal pulses.     Heart sounds: Normal heart sounds.  Pulmonary:     Effort: Pulmonary effort is normal.     Breath sounds: Normal breath sounds.  Neurological:     General: No focal deficit present.     Mental Status: He is alert and oriented to person, place, and time.  Psychiatric:        Mood and Affect: Mood normal.        Behavior: Behavior normal.     LABORATORY DATA:  I have reviewed the labs as listed.  CBC Latest Ref Rng & Units 09/07/2021 05/01/2021 03/09/2021  WBC 4.0 - 10.5 K/uL 7.9 6.2 5.4  Hemoglobin 13.0 - 17.0 g/dL 11.6(L) 12.6(L) 12.0(L)  Hematocrit 39.0 - 52.0 % 34.5(L) 39.3 37.1(L)  Platelets 150 - 400 K/uL 182 141(L) 154   CMP Latest Ref Rng & Units 09/07/2021 08/18/2021 08/02/2021  Glucose 70 - 99 mg/dL 84 112(H) -  BUN 8 - 23 mg/dL 28(H) 23 -  Creatinine 0.61 - 1.24 mg/dL 1.90(H) 1.68(H) -  Sodium 135 - 145 mmol/L 138 140 -  Potassium 3.5 - 5.1 mmol/L 4.2 3.9 -  Chloride 98 - 111 mmol/L 102 104  -  CO2 22 - 32 mmol/L 29 28 -  Calcium 8.9 - 10.3 mg/dL 8.8(L) 8.5(L) 9.0  Total Protein 6.5 - 8.1 g/dL 6.7 - -  Total Bilirubin 0.3 - 1.2 mg/dL 0.2(L) - -  Alkaline Phos 38 - 126 U/L 60 - -  AST 15 - 41 U/L 15 - -  ALT 0 - 44 U/L 20 - -    DIAGNOSTIC IMAGING:  I have independently reviewed the scans and discussed with the patient. CUP PACEART REMOTE DEVICE CHECK  Result Date: 08/31/2021 Scheduled remote reviewed. Normal device function.  Next remote 91 days. Kathy Breach, RN, CCDS, [employer]    ASSESSMENT:  1.  Lung nodules: -He was ex-smoker, smoked more than 2 packs/day for many years and quit in 2001. -CT chest on 07/25/2020 showed numerous (greater than 10) new poorly marginated subsolid pulmonary nodules scattered throughout both lungs, largest 1.3 cm in the posterior right middle lobe.  Infectious/inflammatory nodularity is more likely with pulmonary metastasis not entirely excluded.  No adenopathy.   2.  T2 N0 melanoma of the left thigh: -Status post resection in 2015 at [facility].   3.  CKD: -Follow-up with Dr. Theador Hawthorne.   4.  Mild thrombocytopenia: -He has mild intermittent thrombocytopenia of several years duration.   5.  Normocytic anemia: -Combination anemia from CKD and relative iron deficiency.   PLAN:  1.  Lung nodules: - Reviewed CT from 06/22/2021 which showed pulmonary nodules are stable and some are regressed.  This indicates a benign etiology.   2.  CKD: - Creatinine on 09/07/2021 was 1.9, slightly up from his baseline. - Continue follow-up with Dr. Theador Hawthorne.   3.  Mild thrombocytopenia: -He has intermittent mild thrombocytopenia.  Today's platelet count is normal at 182.   4.  Normocytic anemia: - Continue iron gummy every day. - Ferritin is 300 and percent saturation is 23.  Anemia secondary to CKD and relative iron deficiency.  Hemoglobin 11.6. - RTC 6 months for follow-up with repeat labs and iron and B12.  5.  Papillary thyroid  carcinoma: - Thyroid surgery by Dr. Harlow Asa. - He is having radioactive iodine pill in January 2023 under the direction of Dr. Loanne Drilling..   Orders placed this encounter:  No orders of the defined types were placed in this encounter.    Derek Jack, MD [place] [phone]   I, Thana Ates, am acting as a scribe for Dr. Derek Jack.  I, Derek Jack MD, have reviewed the above documentation for accuracy and completeness, and I agree with the above.

## 2021-09-14 ENCOUNTER — Other Ambulatory Visit: Payer: Self-pay

## 2021-09-14 ENCOUNTER — Inpatient Hospital Stay (HOSPITAL_COMMUNITY): Payer: Medicare PPO | Admitting: Hematology

## 2021-09-14 VITALS — BP 106/76 | HR 64 | Temp 98.2°F | Resp 18 | Ht 66.0 in | Wt 214.0 lb

## 2021-09-14 DIAGNOSIS — Z87891 Personal history of nicotine dependence: Secondary | ICD-10-CM | POA: Diagnosis not present

## 2021-09-14 DIAGNOSIS — D696 Thrombocytopenia, unspecified: Secondary | ICD-10-CM

## 2021-09-14 DIAGNOSIS — N189 Chronic kidney disease, unspecified: Secondary | ICD-10-CM | POA: Diagnosis not present

## 2021-09-14 DIAGNOSIS — R918 Other nonspecific abnormal finding of lung field: Secondary | ICD-10-CM

## 2021-09-14 DIAGNOSIS — C439 Malignant melanoma of skin, unspecified: Secondary | ICD-10-CM | POA: Diagnosis not present

## 2021-09-14 DIAGNOSIS — D649 Anemia, unspecified: Secondary | ICD-10-CM | POA: Diagnosis not present

## 2021-09-14 DIAGNOSIS — I4891 Unspecified atrial fibrillation: Secondary | ICD-10-CM | POA: Diagnosis not present

## 2021-09-14 DIAGNOSIS — C73 Malignant neoplasm of thyroid gland: Secondary | ICD-10-CM | POA: Diagnosis not present

## 2021-09-14 DIAGNOSIS — Z85820 Personal history of malignant melanoma of skin: Secondary | ICD-10-CM | POA: Diagnosis not present

## 2021-09-14 DIAGNOSIS — I110 Hypertensive heart disease with heart failure: Secondary | ICD-10-CM | POA: Diagnosis not present

## 2021-09-14 NOTE — Patient Instructions (Signed)
[place] at [facility] Discharge Instructions  You were seen and examined today by Dr. Delton Coombes. He reviewed your most recent labs and scan and everything looks good except your creatinine levels (kidney function) is slightly elevated. He wants you to drink plenty of water to help with this. Please keep follow up as scheduled in 6 months.   Thank you for choosing [place] at [facility] to provide your oncology and hematology care.  To afford each patient quality time with our provider, please arrive at least 15 minutes before your scheduled appointment time.   If you have a lab appointment with the [facility] please come in thru the Main Entrance and check in at the main information desk.  You need to re-schedule your appointment should you arrive 10 or more minutes late.  We strive to give you quality time with our providers, and arriving late affects you and other patients whose appointments are after yours.  Also, if you no show three or more times for appointments you may be dismissed from the clinic at the providers discretion.     Again, thank you for choosing [facility].  Our hope is that these requests will decrease the amount of time that you wait before being seen by our physicians.       _____________________________________________________________  Should you have questions after your visit to [facility], please contact our office at [phone] and follow the prompts.  Our office hours are 8:00 a.m. and 4:30 p.m. Monday - Friday.  Please note that voicemails left after 4:00 p.m. may not be returned until the following business day.  We are closed weekends and major holidays.  You do have access to a nurse 24-7, just call the main number to the clinic [phone] and do not press any options, hold on the line and a nurse will answer the phone.    For prescription refill requests, have your pharmacy  contact our office and allow 72 hours.    Due to Covid, you will need to wear a mask upon entering the hospital. If you do not have a mask, a mask will be given to you at the Main Entrance upon arrival. For doctor visits, patients may have 1 support person age 37 or older with them. For treatment visits, patients can not have anyone with them due to social distancing guidelines and our immunocompromised population.

## 2021-09-21 NOTE — Written Directive (Addendum)
MOLECULAR IMAGING AND THERAPEUTICS WRITTEN DIRECTIVE   PATIENT NAME: Chad Brown  PT DOB:   [birth year]                                              MRN: [id number]  ---------------------------------------------------------------------------------------------------------------------   I-131 THYROID CANCER THERAPY   RADIOPHARMACEUTICAL:  Iodine-131 Capsule    PRESCRIBED DOSE FOR ADMINISTRATION: 60 mCi   ROUTE OFADMINISTRATION:  PO   DIAGNOSIS:Papillary Thyroid Cancer   REFERRING PHYSICIAN:[place]   THYROGEN STIMULATION OR HORMONE WITHDRAW:Thyrogen Stimulation   REMNANT ABLATION OR ADJUVANT THERAPY:Remnant Ablation   DATE OF THYROIDECTOMY: 05/11/21   SURGEON:Dr. Harlow Asa   TSH:   Lab Results  Component Value Date   TSH 9.19 (H) 08/02/2021   TSH 3.441 10/27/2019   TSH 3.135 10/22/2018     PRIOR I-131 THERAPY (Date and Dose):None   Pathology:  Cell type: [x]   Papillary  []   Follicular  []   [place]   Largest tumor focus:  2.1    cm  Extrathyroidal Extension?     Yes []   No [x]     Lymphovascular Invasion?  Yes  []   No  [x]     Margins positive ? Yes []   No [x]     Lymph nodes positive? Yes []   No  []       # positive nodes:  0 # negative nodes:  0   TNM staging: pT:  2       PN:         Mx:    ADDITIONAL PHYSICIAN COMMENTS/NOTES Remnant ablation . Low risk disease  AUTHORIZED USER SIGNATURE & TIME STAMP Rennis Golden, MD   09/22/21    12:37 PM

## 2021-10-03 ENCOUNTER — Ambulatory Visit (INDEPENDENT_AMBULATORY_CARE_PROVIDER_SITE_OTHER): Payer: Medicare PPO

## 2021-10-03 DIAGNOSIS — I5022 Chronic systolic (congestive) heart failure: Secondary | ICD-10-CM

## 2021-10-03 DIAGNOSIS — Z95810 Presence of automatic (implantable) cardiac defibrillator: Secondary | ICD-10-CM

## 2021-10-06 NOTE — Progress Notes (Signed)
EPIC Encounter for ICM Monitoring  Patient Name: Chad Brown is a 81 y.o. male Date: 10/06/2021 Primary Care Physican: Ginger Organ., MD Primary Cardiologist: Nishan/McLean Electrophysiologist: Allred Bi-V Pacing: 98.2%   07/28/2021 Weight: 212 lbs    Time in AT/AF   0.0 hr/day (0.0%) (taking Xarelto)   Spoke with patient and heart failure questions reviewed.  Pt asymptomatic for fluid accumulation.  Reports feeling well at this time and voices no complaints.    Optivol thoracic impedance suggesting normal fluid levels.   Prescribed:  Furosemide 40 mg 1 tablet (40 mg total) by mouth in the morning and 0.5 tablet (20 mg total)  every evening. Spironolactone 25 mg Take 1 tablet (25 mg total) by mouth daily. Take at bedtime   Labs: 05/01/2021 Creatinine 1.43, BUN 24, Potassium 4.6, Sodium 142, GFR 50 03/09/2021 Creatinine 1.40, BUN 18, Potassium 3.7, Sodium 139, GFR 51 02/17/2021 Creatinine 1.64, BUN 17, Potassium 3.9, Sodium 139, GFR 42 02/07/2021 Creatinine 1.43, BUN 14,Potassium 3.8, Sodium 141,  GFR 50  10/27/2020 Creatinine 1.34, BUN 17, Potassium 3.3, Sodium 137, GFR 54 A complete set of results can be found in Results Review.   Recommendations:  No changes and encouraged to call if experiencing any fluid symptoms.    Follow-up plan: ICM clinic phone appointment on 11/03/2021.  91 day device clinic remote transmission 11/30/2021.     EP/Cardiology Next Office Visit:  02/17/2022 with Dr Aundra Dubin.     Recall 04/10/2022 with Tommye Standard, PA.     Copy of ICM check sent to Dr. Rayann Heman.     3 month ICM trend: 10/03/2021.    12-14 Month ICM trend:     Rosalene Billings, RN 10/06/2021 3:35 PM

## 2021-10-16 DIAGNOSIS — I10 Essential (primary) hypertension: Secondary | ICD-10-CM | POA: Diagnosis not present

## 2021-10-16 DIAGNOSIS — E785 Hyperlipidemia, unspecified: Secondary | ICD-10-CM | POA: Diagnosis not present

## 2021-10-16 DIAGNOSIS — D649 Anemia, unspecified: Secondary | ICD-10-CM | POA: Diagnosis not present

## 2021-10-16 DIAGNOSIS — Z125 Encounter for screening for malignant neoplasm of prostate: Secondary | ICD-10-CM | POA: Diagnosis not present

## 2021-10-16 DIAGNOSIS — E041 Nontoxic single thyroid nodule: Secondary | ICD-10-CM | POA: Diagnosis not present

## 2021-10-17 ENCOUNTER — Encounter (HOSPITAL_COMMUNITY)
Admission: RE | Admit: 2021-10-17 | Discharge: 2021-10-17 | Disposition: A | Discharge status: Home / Self Care | Payer: Medicare PPO | Source: Ambulatory Visit | Attending: Endocrinology | Admitting: Endocrinology

## 2021-10-17 ENCOUNTER — Other Ambulatory Visit: Payer: Self-pay | Admitting: Cardiology

## 2021-10-17 ENCOUNTER — Other Ambulatory Visit: Payer: Self-pay

## 2021-10-17 DIAGNOSIS — D440 Neoplasm of uncertain behavior of thyroid gland: Secondary | ICD-10-CM | POA: Diagnosis not present

## 2021-10-17 MED ORDER — THYROTROPIN ALFA 0.9 MG IM SOLR
0.9000 mg | INTRAMUSCULAR | Status: AC
  Administered 2021-10-17: 0.9 mg via INTRAMUSCULAR

## 2021-10-17 MED ORDER — THYROTROPIN ALFA 0.9 MG IM SOLR
INTRAMUSCULAR | Status: AC
  Filled 2021-10-17: qty 0.9

## 2021-10-18 ENCOUNTER — Encounter (HOSPITAL_COMMUNITY)
Admission: RE | Admit: 2021-10-18 | Discharge: 2021-10-18 | Disposition: A | Discharge status: Home / Self Care | Payer: Medicare PPO | Source: Ambulatory Visit | Attending: Endocrinology | Admitting: Endocrinology

## 2021-10-18 DIAGNOSIS — D440 Neoplasm of uncertain behavior of thyroid gland: Secondary | ICD-10-CM | POA: Diagnosis not present

## 2021-10-18 MED ORDER — THYROTROPIN ALFA 0.9 MG IM SOLR: 0.9000 mg | INTRAMUSCULAR | Status: DC

## 2021-10-18 MED ORDER — THYROTROPIN ALFA 0.9 MG IM SOLR
INTRAMUSCULAR | Status: AC
  Administered 2021-10-18: 0.9 mg
  Filled 2021-10-18: qty 0.9

## 2021-10-19 ENCOUNTER — Encounter (HOSPITAL_COMMUNITY)
Admission: RE | Admit: 2021-10-19 | Discharge: 2021-10-19 | Disposition: A | Discharge status: Home / Self Care | Payer: Medicare PPO | Source: Ambulatory Visit | Attending: Endocrinology | Admitting: Endocrinology

## 2021-10-19 ENCOUNTER — Other Ambulatory Visit: Payer: Self-pay

## 2021-10-19 DIAGNOSIS — C73 Malignant neoplasm of thyroid gland: Secondary | ICD-10-CM | POA: Diagnosis not present

## 2021-10-19 MED ORDER — SODIUM IODIDE I 131 CAPSULE
61.2000 | Freq: Once | INTRAVENOUS | Status: AC
  Administered 2021-10-19: 61.2 via ORAL

## 2021-10-24 ENCOUNTER — Other Ambulatory Visit: Payer: Self-pay

## 2021-10-24 ENCOUNTER — Other Ambulatory Visit: Payer: Self-pay | Admitting: Cardiology

## 2021-10-24 ENCOUNTER — Ambulatory Visit: Payer: Medicare PPO | Admitting: Endocrinology

## 2021-10-24 VITALS — BP 90/50 | HR 74 | Ht 66.0 in | Wt 214.4 lb

## 2021-10-24 DIAGNOSIS — C73 Malignant neoplasm of thyroid gland: Secondary | ICD-10-CM

## 2021-10-24 DIAGNOSIS — E039 Hypothyroidism, unspecified: Secondary | ICD-10-CM | POA: Diagnosis not present

## 2021-10-24 NOTE — Progress Notes (Signed)
Subjective:    Patient ID: Chad Brown, male    DOB: 04-22-1941, 81 y.o.   MRN: [id number]  HPI Pt returns for f/u of PTC, with this chronology:  10/19 chest CT: pulm nodules 10/21 chest CT: mult pulm nodules 1/22 chest CT: pulm nodules are worse 3/22 PET CT: hypermetabolism in the thyroid and chest.   4/22 [place] MNG 5/22 chest CT: pulm nodules are smaller.   5/22 right thyroid bx is cat 5 5/22 due to question of medical risk of surgery, I ordered nuc med thyroid body scan.   6/22 radiol canceled the nuc med body scan.  8/22 thyroidect  path shows 2.1 cm right PTC (T2N0).   9/22 chest CT: pulm nodules are stable/smaller.   1/23 RAI, with thyrogen  30mi he takes synthroid as rx'ed.     Pt also has postsurgical hypoparathyroidism (he takes rocaltrol;  He also takes OTC Vit-D, uncertain dosage).   Past Medical History:  Diagnosis Date   Adenomatous colon polyp    tubular   AICD (automatic cardioverter/defibrillator) present 03/08/2015   MDT CRTD dual pacemaker and defib   Anemia    iron deficient   Arthritis    "about all my joints; hands, knees, back" (03/08/2015)   Atherosclerosis    Cataract    left eye small   Cholelithiasis    gallstones   Chronic systolic CHF (congestive heart failure) ([place]    a. New dx 12/2012 ? NICM, may be r/t afib. b. Nuc 03/2013 - normal;  c. 03/2015 TEE EF 15-20%.   Colon polyp, hyperplastic 01/2015   removed precancerous lesions   Depression    Diverticulosis    Dysrhythmia    afib   GERD (gastroesophageal reflux disease)    Glaucoma    right eye   Hyperlipidemia    Hypertension    [place]   "right; it's never been biopsied"   Mel[place]oma of lower leg ([facility] 2015   "left; right at my knee"   [place]ocardial infarction ([place]   OSA (obstructive sleep apnea) 01/04/2016   no longer tolerates cpap   Peripheral vision loss, right 2006   Persistent atrial fibrillation ([facility]    a. Dx 12/2012, s/p TEE/DCCV 01/26/13. b. On  Xarelto (CHA2DS2VASc = 3);  c. 03/2015 TEE (EF 15-20%, no LAA thrombus) and DCCV - amio increased to 200 mg bid.   Pneumonia    Urinary hesitancy due to benign prostatic hypertrophy     Past Surgical History:  Procedure Laterality Date   ATRIAL FIBRILLATION ABLATION N/A 11/03/2019   Procedure: ATRIAL FIBRILLATION ABLATION;  Surgeon: AThompson Grayer MD;  [facility];  Service: Cardiovascular;  Laterality: N/A;   BACK SURGERY     upper back, cannot turn neck well   [place] 01/26/2013   Procedure: CARDIOVERSION;  Surgeon: BLelon Perla MD;  [place] [facility]  Service: Cardiovascular;  Laterality: N/A;   CARDIOVERSION N/A 03/23/2015   Procedure: CARDIOVERSION;  Surgeon: MJerline Pain MD;  [place] [facility]  Service: Cardiovascular;  Laterality: N/A;   CARDIOVERSION N/A 08/14/2017   Procedure: CARDIOVERSION;  Surgeon: NJosue Hector MD;  [place] [facility]  Service: Cardiovascular;  Laterality: N/A;   CATARACT EXTRACTION Right ~ 2006   COLONOSCOPY WITH PROPOFOL N/A 02/10/2015   Procedure: COLONOSCOPY WITH PROPOFOL;  Surgeon: CGatha Mayer MD;  [facility];  Service: Endoscopy;  Laterality: N/A;   COLONOSCOPY  WITH PROPOFOL N/A 08/07/2016   Procedure: COLONOSCOPY WITH PROPOFOL;  Surgeon: Gatha Mayer, MD;  [facility];  Service: Endoscopy;  Laterality: N/A;   ENTEROSCOPY N/A 08/17/2015   Procedure: ENTEROSCOPY;  Surgeon: Gatha Mayer, MD;  [facility];  Service: Endoscopy;  Laterality: N/A;   EP IMPLANTABLE DEVICE N/A 03/08/2015   MDT Hillery Aldo CRT-D for nonischemic CM by Dr Rayann Heman for primary prevention   GLAUCOMA SURGERY Right ~ 2006   "put 3 stents in to drain fluid" (03/08/2015) not successful, sent to [facility] to try to get last stent out   HOT HEMOSTASIS N/A 08/07/2016   Procedure: HOT HEMOSTASIS (ARGON PLASMA COAGULATION/BICAP);  Surgeon: Gatha Mayer, MD;  [facility];   Service: Endoscopy;  Laterality: N/A;   INCISION AND DRAINAGE ABSCESS POSTERIOR CERVICALSPINE  05/2012   JOINT REPLACEMENT     MELANOMA EXCISION Left 2015   "lower leg; right at my knee"   REFRACTIVE SURGERY Right ~ 2006 X 2   "twice; both done at [place]" (03/08/2015   SURGERY SCROTAL / TESTICULAR Right 1990's   TEE WITHOUT CARDIOVERSION N/A 01/26/2013   Procedure: TRANSESOPHAGEAL ECHOCARDIOGRAM (TEE);  Surgeon: Lelon Perla, MD;  Location: [facility] ENDOSCOPY;  Service: Cardiovascular;  Laterality: N/A;  [name] anes. /    TEE WITHOUT CARDIOVERSION N/A 10/05/2014   Procedure: TRANSESOPHAGEAL ECHOCARDIOGRAM (TEE)  with cardioversion;  Surgeon: Thayer Headings, MD;  Location[facility] ENDOSCOPY;  Service: Cardiovascular;  Laterality: N/A;  12:52 synched cardioversion at 120 joules,...afib to SR...12 lead EKG ordered.Marland KitchenMarland KitchenCardiozem d/c'ed per MD verbal order at SR   TEE WITHOUT CARDIOVERSION N/A 03/23/2015   Procedure: TRANSESOPHAGEAL ECHOCARDIOGRAM (TEE);  Surgeon: Jerline Pain, MD;  Location: [place];  Service: Cardiovascular;  Laterality: N/A;   TEE WITHOUT CARDIOVERSION N/A 11/02/2019   Procedure: TRANSESOPHAGEAL ECHOCARDIOGRAM (TEE);  Surgeon: Josue Hector, MD;  Location: [facility]COPY;  Service: Cardiovascular;  Laterality: N/A;   THORACIC SPINE SURGERY  03/2000   "ground calcium deposits from upper thoracic" (01/26/2013)   THYROIDECTOMY N/A 05/11/2021   Procedure: TOTAL THYROIDECTOMY;  Surgeon: Armandina Gemma, MD;  [facility];  Service: General;  Laterality: N/A;   TOTAL HIP ARTHROPLASTY Right 06/2007    Social History   Socioeconomic History   Marital status: Married    Spouse name: Not on file   Number of children: 3   Years of education: Not on file   Highest education level: Not on file  Occupational History   Occupation: retired  Tobacco Use   Smoking status: Former    Packs/day: 3.00    Years: 48.00    Pack years: 144.00    Types: Cigarettes    Quit date: 09/28/2000    Years since  quitting: 21.0   Smokeless tobacco: Never  Vaping Use   Vaping Use: Never used  Substance and Sexual Activity   Alcohol use: No   Drug use: No   Sexual activity: Not Currently  Other Topics Concern   Not on file  Social History Narrative   Pt lives in [place] with spouse.  3 children are grown and healthy.   Retired.  Ran a country store for 30 years, previously worked in the [employer] for 16 years   Social Determinants of Radio broadcast assistant Strain: Not on [employer] Insecurity: Not on file  Transportation Needs: No Transportation Needs   Lack of Transportation (Medical): No   Lack of Transportation (Non-Medical): No  Physical Activity:  Inactive   Days of Exercise per Week: 0 days   Minutes of Exercise per Session: 0 min  Stress: Not on file  Social Connections: Not on file  Intimate Partner Violence: Not At Risk   Fear of Current or Ex-Partner: No   Emotionally Abused: No   Physically Abused: No   Sexually Abused: No    Current Outpatient Medications on File Prior to Visit  Medication Sig Dispense Refill   calcitRIOL (ROCALTROL) 0.25 MCG capsule Take 0.25 mcg by mouth every Monday, Wednesday, and Friday.     calcium carbonate (TUMS - DOSED IN MG ELEMENTAL CALCIUM) 500 MG chewable tablet Chew 1 tablet by mouth daily.     carvedilol (COREG) 12.5 MG tablet Take 1 tablet (12.5 mg total) by mouth 2 (two) times daily. 180 tablet 3   dorzolamide-timolol (COSOPT) 22.3-6.8 MG/ML ophthalmic solution Place 1 drop into the right eye 2 (two) times daily.     empagliflozin (JARDIANCE) 10 MG TABS tablet Take 1 tablet (10 mg total) by mouth daily before breakfast. 90 tablet 3   escitalopram (LEXAPRO) 20 MG tablet Take 1 tablet (20 mg total) by mouth daily. 90 tablet 0   ferrous sulfate 325 (65 FE) MG tablet Take 325 mg by mouth daily with breakfast.     finasteride (PROSCAR) 5 MG tablet Take 1 tablet (5 mg total) by mouth daily. 30 tablet 2   furosemide (LASIX) 40  MG tablet Take 1 tablet (40 mg total) by mouth every morning AND 0.5 tablets (20 mg total) every evening. 135 tablet 3   gabapentin (NEURONTIN) 100 MG capsule Take 200 mg by mouth 2 (two) times daily.      latanoprost (XALATAN) 0.005 % ophthalmic solution Place 1 drop into the right eye at bedtime.     levothyroxine (SYNTHROID) 125 MCG tablet Take 1 tablet (125 mcg total) by mouth daily. 90 tablet 3   nitroGLYCERIN (NITROSTAT) 0.4 MG SL tablet Place 0.4 mg under the tongue every 5 (five) minutes as needed for chest pain.     Polyethyl Glycol-Propyl Glycol 0.4-0.3 % SOLN Place 1 drop into the left eye in the morning and at bedtime.     REPATHA SURECLICK 676 MG/ML SOAJ Inject 1 pen into the skin every 14 (fourteen) days. 2 mL 11   Rivaroxaban (XARELTO) 15 MG TABS tablet Take 1 tablet (15 mg total) by mouth daily with supper. 90 tablet 1   sacubitril-valsartan (ENTRESTO) 24-26 MG Take 0.5 tablets by mouth 2 (two) times daily.     spironolactone (ALDACTONE) 25 MG tablet Take 1 tablet (25 mg total) by mouth daily. Take at bedtime 90 tablet 3   tamsulosin (FLOMAX) 0.4 MG CAPS Take 0.4 mg by mouth at bedtime.      VITAMIN D PO Take 1 tablet by mouth daily.     No current facility-administered medications on file prior to visit.    Allergies  Allergen Reactions   Pravastatin Sodium Other (See Comments)    Joint and muscle pain   Azithromycin Diarrhea    Family History  Problem Relation Age of Onset   Kidney disease Mother    Heart disease Mother        MI, open heart   Diabetes Mother        dialysis   Leukemia Father    Colon cancer Paternal Uncle    Lung cancer Paternal Uncle        x 2   Prostate cancer Paternal Uncle  Diabetes Maternal Grandmother    Heart attack Maternal Uncle    Diabetes Maternal Aunt        x 3   Diabetes Maternal Uncle    Thyroid disease Sister     BP (!) 90/50    Pulse 74    Ht 5' 6"  (1.676 m)    Wt 214 lb 6.4 oz (97.3 kg)    SpO2 96%    BMI 34.61 kg/m     Review of Systems     Objective:   Physical Exam   Lab Results  Component Value Date   PTH 30 10/24/2021   CALCIUM 9.4 10/24/2021       Assessment & Plan:  Hypoparathyroidism: well-controlled. Please continue the same rocaltrol and Vit-D.  PTC: we are awaiting post-therapy scan.     Patient Instructions  Blood tests are requested for you today.  We'll let you know about the results of the blood and the scan you will have in a few days.   Please come back for a follow-up appointment in 2-3 months.

## 2021-10-24 NOTE — Patient Instructions (Addendum)
Blood tests are requested for you today.  We'll let you know about the results of the blood and the scan you will have in a few days.   Please come back for a follow-up appointment in 2-3 months.

## 2021-10-25 LAB — VITAMIN D 25 HYDROXY (VIT D DEFICIENCY, FRACTURES): VITD: 35.26 ng/mL (ref 30.00–100.00)

## 2021-10-25 LAB — PTH, INTACT AND CALCIUM
Calcium: 9.4 mg/dL (ref 8.6–10.3)
PTH: 30 pg/mL (ref 16–77)

## 2021-10-26 DIAGNOSIS — Z1339 Encounter for screening examination for other mental health and behavioral disorders: Secondary | ICD-10-CM | POA: Diagnosis not present

## 2021-10-26 DIAGNOSIS — N1831 Chronic kidney disease, stage 3a: Secondary | ICD-10-CM | POA: Diagnosis not present

## 2021-10-26 DIAGNOSIS — I130 Hypertensive heart and chronic kidney disease with heart failure and stage 1 through stage 4 chronic kidney disease, or unspecified chronic kidney disease: Secondary | ICD-10-CM | POA: Diagnosis not present

## 2021-10-26 DIAGNOSIS — D6869 Other thrombophilia: Secondary | ICD-10-CM | POA: Diagnosis not present

## 2021-10-26 DIAGNOSIS — Z0000 Encounter for general adult medical examination without abnormal findings: Secondary | ICD-10-CM | POA: Diagnosis not present

## 2021-10-26 DIAGNOSIS — R82998 Other abnormal findings in urine: Secondary | ICD-10-CM | POA: Diagnosis not present

## 2021-10-26 DIAGNOSIS — E785 Hyperlipidemia, unspecified: Secondary | ICD-10-CM | POA: Diagnosis not present

## 2021-10-26 DIAGNOSIS — I5022 Chronic systolic (congestive) heart failure: Secondary | ICD-10-CM | POA: Diagnosis not present

## 2021-10-26 DIAGNOSIS — Z7901 Long term (current) use of anticoagulants: Secondary | ICD-10-CM | POA: Diagnosis not present

## 2021-10-26 DIAGNOSIS — I700 Atherosclerosis of aorta: Secondary | ICD-10-CM | POA: Diagnosis not present

## 2021-10-26 DIAGNOSIS — Z1331 Encounter for screening for depression: Secondary | ICD-10-CM | POA: Diagnosis not present

## 2021-10-26 DIAGNOSIS — R7301 Impaired fasting glucose: Secondary | ICD-10-CM | POA: Diagnosis not present

## 2021-10-27 ENCOUNTER — Other Ambulatory Visit: Payer: Self-pay

## 2021-10-27 ENCOUNTER — Encounter (HOSPITAL_COMMUNITY)
Admission: RE | Admit: 2021-10-27 | Discharge: 2021-10-27 | Disposition: A | Discharge status: Home / Self Care | Payer: Medicare PPO | Source: Ambulatory Visit | Attending: Endocrinology | Admitting: Endocrinology

## 2021-10-27 DIAGNOSIS — C73 Malignant neoplasm of thyroid gland: Secondary | ICD-10-CM | POA: Diagnosis not present

## 2021-10-27 DIAGNOSIS — E890 Postprocedural hypothyroidism: Secondary | ICD-10-CM | POA: Diagnosis not present

## 2021-11-02 DIAGNOSIS — I129 Hypertensive chronic kidney disease with stage 1 through stage 4 chronic kidney disease, or unspecified chronic kidney disease: Secondary | ICD-10-CM | POA: Diagnosis not present

## 2021-11-02 DIAGNOSIS — N1832 Chronic kidney disease, stage 3b: Secondary | ICD-10-CM | POA: Diagnosis not present

## 2021-11-02 DIAGNOSIS — I5042 Chronic combined systolic (congestive) and diastolic (congestive) heart failure: Secondary | ICD-10-CM | POA: Diagnosis not present

## 2021-11-02 DIAGNOSIS — E211 Secondary hyperparathyroidism, not elsewhere classified: Secondary | ICD-10-CM | POA: Diagnosis not present

## 2021-11-02 DIAGNOSIS — D696 Thrombocytopenia, unspecified: Secondary | ICD-10-CM | POA: Diagnosis not present

## 2021-11-02 DIAGNOSIS — D638 Anemia in other chronic diseases classified elsewhere: Secondary | ICD-10-CM | POA: Diagnosis not present

## 2021-11-03 ENCOUNTER — Other Ambulatory Visit (HOSPITAL_COMMUNITY): Payer: Self-pay | Admitting: *Deleted

## 2021-11-03 ENCOUNTER — Ambulatory Visit (INDEPENDENT_AMBULATORY_CARE_PROVIDER_SITE_OTHER): Payer: Medicare PPO

## 2021-11-03 DIAGNOSIS — I5022 Chronic systolic (congestive) heart failure: Secondary | ICD-10-CM

## 2021-11-03 DIAGNOSIS — Z95810 Presence of automatic (implantable) cardiac defibrillator: Secondary | ICD-10-CM | POA: Diagnosis not present

## 2021-11-03 MED ORDER — ENTRESTO 24-26 MG PO TABS: 0.5000 | ORAL_TABLET | Freq: Two times a day (BID) | ORAL | 11 refills | Status: DC

## 2021-11-03 NOTE — Addendum Note (Signed)
Addended by: Rosalene Billings on: 11/03/2021 02:46 PM   Modules accepted: Level of Service

## 2021-11-03 NOTE — Progress Notes (Signed)
Spoke with patient and requested remote transmission for fluid level review.  He received error [id number] on his home monitor.  Provided [employer] tech support number and ICM remote transmission rescheduled for 11/20/2021.

## 2021-11-03 NOTE — Progress Notes (Signed)
EPIC Encounter for ICM Monitoring  Patient Name: Chad Brown is a 81 y.o. male Date: 11/03/2021 Primary Care Physican: Ginger Organ., MD Primary Cardiologist: Nishan/McLean Electrophysiologist: Allred Bi-V Pacing: 97.3%   11/03/2021 Weight: 212-213 lbs    Time in AT/AF   0.0 hr/day (0.0%) (taking Xarelto)   Spoke with patient and heart failure questions reviewed.  Pt asymptomatic for fluid accumulation.  Received transmission with assistance of [employer] tech support    Optivol thoracic impedance suggesting normal fluid levels.   Prescribed:  Furosemide 40 mg 1 tablet (40 mg total) by mouth in the morning and 0.5 tablet (20 mg total)  every evening. Spironolactone 25 mg Take 1 tablet (25 mg total) by mouth daily. Take at bedtime   Labs: 05/01/2021 Creatinine 1.43, BUN 24, Potassium 4.6, Sodium 142, GFR 50 03/09/2021 Creatinine 1.40, BUN 18, Potassium 3.7, Sodium 139, GFR 51 02/17/2021 Creatinine 1.64, BUN 17, Potassium 3.9, Sodium 139, GFR 42 02/07/2021 Creatinine 1.43, BUN 14,Potassium 3.8, Sodium 141,  GFR 50  10/27/2020 Creatinine 1.34, BUN 17, Potassium 3.3, Sodium 137, GFR 54 A complete set of results can be found in Results Review.   Recommendations:  No changes and encouraged to call if experiencing any fluid symptoms.    Follow-up plan: ICM clinic phone appointment on 12/04/2021.  91 day device clinic remote transmission 11/30/2021.     EP/Cardiology Next Office Visit:  02/17/2022 with Dr Aundra Dubin.     Recall 04/10/2022 with Tommye Standard, PA.     Copy of ICM check sent to Dr. Rayann Heman.     3 month ICM trend: 11/03/2021.    12-14 Month ICM trend:     Rosalene Billings, RN 11/03/2021 2:41 PM

## 2021-11-14 ENCOUNTER — Other Ambulatory Visit: Payer: Self-pay | Admitting: Cardiology

## 2021-11-30 ENCOUNTER — Ambulatory Visit (INDEPENDENT_AMBULATORY_CARE_PROVIDER_SITE_OTHER): Payer: Medicare PPO

## 2021-11-30 DIAGNOSIS — I428 Other cardiomyopathies: Secondary | ICD-10-CM | POA: Diagnosis not present

## 2021-12-01 LAB — CUP PACEART REMOTE DEVICE CHECK
Battery Remaining Longevity: 17 mo
Battery Voltage: 2.83 V
Brady Statistic AP VP Percent: 98.65 %
Brady Statistic AP VS Percent: 0.08 %
Brady Statistic AS VP Percent: 1.21 %
Brady Statistic AS VS Percent: 0.06 %
Brady Statistic RA Percent Paced: 97.03 %
Brady Statistic RV Percent Paced: 98.18 %
Date Time Interrogation Session: 20230302063627
HighPow Impedance: 76 Ohm
Implantable Lead Implant Date: 20160607
Implantable Lead Implant Date: 20160607
Implantable Lead Implant Date: 20160607
Implantable Lead Location: 753858
Implantable Lead Location: 753859
Implantable Lead Location: 753860
Implantable Lead Model: 4598
Implantable Lead Model: 5076
Implantable Pulse Generator Implant Date: 20160607
Lead Channel Impedance Value: 1026 Ohm
Lead Channel Impedance Value: 1159 Ohm
Lead Channel Impedance Value: 1178 Ohm
Lead Channel Impedance Value: 1349 Ohm
Lead Channel Impedance Value: 1425 Ohm
Lead Channel Impedance Value: 380 Ohm
Lead Channel Impedance Value: 437 Ohm
Lead Channel Impedance Value: 494 Ohm
Lead Channel Impedance Value: 627 Ohm
Lead Channel Impedance Value: 646 Ohm
Lead Channel Impedance Value: 817 Ohm
Lead Channel Impedance Value: 817 Ohm
Lead Channel Impedance Value: 988 Ohm
Lead Channel Pacing Threshold Amplitude: 0.5 V
Lead Channel Pacing Threshold Amplitude: 0.5 V
Lead Channel Pacing Threshold Amplitude: 1.625 V
Lead Channel Pacing Threshold Pulse Width: 0.4 ms
Lead Channel Pacing Threshold Pulse Width: 0.4 ms
Lead Channel Pacing Threshold Pulse Width: 0.4 ms
Lead Channel Sensing Intrinsic Amplitude: 1.375 mV
Lead Channel Sensing Intrinsic Amplitude: 1.375 mV
Lead Channel Sensing Intrinsic Amplitude: 12.375 mV
Lead Channel Sensing Intrinsic Amplitude: 12.375 mV
Lead Channel Setting Pacing Amplitude: 1.5 V
Lead Channel Setting Pacing Amplitude: 2 V
Lead Channel Setting Pacing Amplitude: 2.25 V
Lead Channel Setting Pacing Pulse Width: 0.4 ms
Lead Channel Setting Pacing Pulse Width: 0.4 ms
Lead Channel Setting Sensing Sensitivity: 0.3 mV

## 2021-12-06 NOTE — Progress Notes (Signed)
Office Visit    Patient Name: Chad Brown Date of Encounter: 12/06/2021  Primary Care Provider:  Ginger Organ., MD Primary Cardiologist:  [name], MD  Chief Complaint  6 month device follow-up  History of Present Illness    Chad Brown is a 81 y.o. male with PMH of PAF,s/p AF ablation 2/21, HTN, thyroid carcinoma(total thyroidectomy) HLD, HFrEF, OSA, CAD, LBBB NICM, with EF 15-20%, s/p Medtronic CRT-D 2016.  Echo completed 1/18 LVEF: 40-45%, mild LVH, diffuse hypokinesis.  Cardiac [phone]) admitted with CP cath performed with non obstructive CAD.  Nuclear stress test performed 6/14 with no evidence of ischemia or infarct.  Diagnosed with atrial fibrillation 4/14, echo performed showed EF 30-35%.  He was last seen by Dr. Rayann Heman in 1/22.Since last being seen in our clinic the patient reports doing well and has no new cardiac complaints.. He underwent a total thyroidectomy in August for thyroid carcinoma.  Radioactive thyroid pill completed January.  He has been staying active and exercising at the gym 1 hour a day 5 days a week.  Today he denies chest pain, palpitations, dyspnea, PND, orthopnea, nausea, vomiting, dizziness, syncope, edema, weight gain, or early satiety.  Device type:  Medtronic Viva Quad XT CRT-D   Past Medical History    Past Medical History:  Diagnosis Date   Adenomatous colon polyp    tubular   AICD (automatic cardioverter/defibrillator) present 03/08/2015   MDT CRTD dual pacemaker and defib   Anemia    iron deficient   Arthritis    "about all my joints; hands, knees, back" (03/08/2015)   Atherosclerosis    Cataract    left eye small   Cholelithiasis    gallstones   Chronic systolic CHF (congestive heart failure) ([place])    a. New dx 12/2012 ? NICM, may be r/t afib. b. Nuc 03/2013 - normal;  c. 03/2015 TEE EF 15-20%.   Colon polyp, hyperplastic 01/2015   removed precancerous lesions   Depression    Diverticulosis    Dysrhythmia     afib   GERD (gastroesophageal reflux disease)    Glaucoma    right eye   Hyperlipidemia    Hypertension    Melanoma of eye ([place]) 2000's   "right; it's never been biopsied"   Melanoma of lower leg ([place]) 2015   "left; right at my knee"   Myocardial infarction ([place]) 1998   OSA (obstructive sleep apnea) 01/04/2016   no longer tolerates cpap   Peripheral vision loss, right 2006   Persistent atrial fibrillation ([place])    a. Dx 12/2012, s/p TEE/DCCV 01/26/13. b. On Xarelto (CHA2DS2VASc = 3);  c. 03/2015 TEE (EF 15-20%, no LAA thrombus) and DCCV - amio increased to 200 mg bid.   Pneumonia    Urinary hesitancy due to benign prostatic hypertrophy    Past Surgical History:  Procedure Laterality Date   ATRIAL FIBRILLATION ABLATION N/A 11/03/2019   Procedure: ATRIAL FIBRILLATION ABLATION;  Surgeon: Thompson Grayer, MD;  Location: [facility];  Service: Cardiovascular;  Laterality: N/A;   BACK SURGERY     upper back, cannot turn neck well   [place] N/A 01/26/2013   Procedure: CARDIOVERSION;  Surgeon: Lelon Perla, MD;  Location: [facility];  Service: Cardiovascular;  Laterality: N/A;   CARDIOVERSION N/A 03/23/2015   Procedure: CARDIOVERSION;  Surgeon: Jerline Pain, MD;  Location: [facility];  Service: Cardiovascular;  Laterality: N/A;   CARDIOVERSION  N/A 08/14/2017   Procedure: CARDIOVERSION;  Surgeon: Josue Hector, MD;  Location: [facility] ENDOSCOPY;  Service: Cardiovascular;  Laterality: N/A;   CATARACT EXTRACTION Right ~ 2006   COLONOSCOPY WITH PROPOFOL N/A 02/10/2015   Procedure: COLONOSCOPY WITH PROPOFOL;  Surgeon: Gatha Mayer, MD;  Location: [facility]OSCOPY;  Service: Endoscopy;  Laterality: N/A;   COLONOSCOPY WITH PROPOFOL N/A 08/07/2016   Procedure: COLONOSCOPY WITH PROPOFOL;  Surgeon: Gatha Mayer, MD;  Location: [facility];  Service: Endoscopy;  Laterality: N/A;   ENTEROSCOPY N/A 08/17/2015   Procedure: ENTEROSCOPY;  Surgeon: Gatha Mayer,  MD;  Location: [facility];  Service: Endoscopy;  Laterality: N/A;   EP IMPLANTABLE DEVICE N/A 03/08/2015   MDT Hillery Aldo CRT-D for nonischemic CM by Dr Rayann Heman for primary prevention   GLAUCOMA SURGERY Right ~ 2006   "put 3 stents in to drain fluid" (03/08/2015) not successful, sent to [facility] to try to get last stent out   HOT HEMOSTASIS N/A 08/07/2016   Procedure: HOT HEMOSTASIS (ARGON PLASMA COAGULATION/BICAP);  Surgeon: Gatha Mayer, MD;  Location: [facility];  Service: Endoscopy;  Laterality: N/A;   INCISION AND DRAINAGE ABSCESS POSTERIOR CERVICALSPINE  05/2012   JOINT REPLACEMENT     MELANOMA EXCISION Left 2015   "lower leg; right at my knee"   REFRACTIVE SURGERY Right ~ 2006 X 2   "twice; both done at [facility]" (03/08/2015   SURGERY SCROTAL / TESTICULAR Right 1990's   TEE WITHOUT CARDIOVERSION N/A 01/26/2013   Procedure: TRANSESOPHAGEAL ECHOCARDIOGRAM (TEE);  Surgeon: Lelon Perla, MD;  Location: [facility] ENDOSCOPY;  Service: Cardiovascular;  Laterality: N/A;  [name] anes. /    TEE WITHOUT CARDIOVERSION N/A 10/05/2014   Procedure: TRANSESOPHAGEAL ECHOCARDIOGRAM (TEE)  with cardioversion;  Surgeon: Thayer Headings, MD;  Location: [facility] ENDOSCOPY;  Service: Cardiovascular;  Laterality: N/A;  12:52 synched cardioversion at 120 joules,...afib to SR...12 lead EKG ordered.Marland KitchenMarland KitchenCardiozem d/c'ed per MD verbal order at SR   TEE WITHOUT CARDIOVERSION N/A 03/23/2015   Procedure: TRANSESOPHAGEAL ECHOCARDIOGRAM (TEE);  Surgeon: Jerline Pain, MD;  Location: [place];  Service: Cardiovascular;  Laterality: N/A;   TEE WITHOUT CARDIOVERSION N/A 11/02/2019   Procedure: TRANSESOPHAGEAL ECHOCARDIOGRAM (TEE);  Surgeon: Josue Hector, MD;  Location: [facility] ENDOSCOPY;  Service: Cardiovascular;  Laterality: N/A;   THORACIC SPINE SURGERY  03/2000   "ground calcium deposits from upper thoracic" (01/26/2013)   THYROIDECTOMY N/A 05/11/2021   Procedure: TOTAL THYROIDECTOMY;  Surgeon: Armandina Gemma, MD;  Location: [facility];  Service:  General;  Laterality: N/A;   TOTAL HIP ARTHROPLASTY Right 06/2007    Allergies  Allergies  Allergen Reactions   Pravastatin Sodium Other (See Comments)    Joint and muscle pain   Azithromycin Diarrhea    Home Medications    Current Outpatient Medications  Medication Sig Dispense Refill   calcitRIOL (ROCALTROL) 0.25 MCG capsule Take 0.25 mcg by mouth every Monday, Wednesday, and Friday.     calcium carbonate (TUMS - DOSED IN MG ELEMENTAL CALCIUM) 500 MG chewable tablet Chew 1 tablet by mouth daily.     carvedilol (COREG) 12.5 MG tablet Take 1 tablet (12.5 mg total) by mouth 2 (two) times daily. 180 tablet 3   dorzolamide-timolol (COSOPT) 22.3-6.8 MG/ML ophthalmic solution Place 1 drop into the right eye 2 (two) times daily.     empagliflozin (JARDIANCE) 10 MG TABS tablet Take 1 tablet (10 mg total) by mouth daily before breakfast. 90 tablet 3   escitalopram (LEXAPRO) 20 MG tablet Take 1  tablet (20 mg total) by mouth daily. 90 tablet 0   ferrous sulfate 325 (65 FE) MG tablet Take 325 mg by mouth daily with breakfast.     finasteride (PROSCAR) 5 MG tablet Take 1 tablet (5 mg total) by mouth daily. 30 tablet 2   furosemide (LASIX) 40 MG tablet Take 1 tablet (40 mg total) by mouth every morning AND 0.5 tablets (20 mg total) every evening. 135 tablet 3   gabapentin (NEURONTIN) 100 MG capsule Take 200 mg by mouth 2 (two) times daily.      latanoprost (XALATAN) 0.005 % ophthalmic solution Place 1 drop into the right eye at bedtime.     levothyroxine (SYNTHROID) 125 MCG tablet Take 1 tablet (125 mcg total) by mouth daily. 90 tablet 3   nitroGLYCERIN (NITROSTAT) 0.4 MG SL tablet Place 0.4 mg under the tongue every 5 (five) minutes as needed for chest pain.     Polyethyl Glycol-Propyl Glycol 0.4-0.3 % SOLN Place 1 drop into the left eye in the morning and at bedtime.     REPATHA SURECLICK 672 MG/ML SOAJ Inject 1 pen into the skin every 14 (fourteen) days. 2 mL 11   Rivaroxaban (XARELTO) 15 MG  TABS tablet Take 1 tablet (15 mg total) by mouth daily with supper. 90 tablet 1   sacubitril-valsartan (ENTRESTO) 24-26 MG Take 0.5 tablets by mouth 2 (two) times daily. 60 tablet 11   spironolactone (ALDACTONE) 25 MG tablet Take 1 tablet (25 mg total) by mouth daily. Take at bedtime 90 tablet 3   tamsulosin (FLOMAX) 0.4 MG CAPS Take 0.4 mg by mouth at bedtime.      VITAMIN D PO Take 1 tablet by mouth daily.     No current facility-administered medications for this visit.     Review of Systems   General:  No chills, fever, night sweats or weight changes.  Cardiovascular:  No chest pain, dyspnea on exertion, edema, orthopnea, palpitations, paroxysmal nocturnal dyspnea. Dermatological: No rash, lesions/masses Respiratory: No cough, dyspnea Urologic: No hematuria, dysuria Abdominal:   No nausea, vomiting, diarrhea, bright red blood per rectum, melena, or hematemesis Neurologic:  No visual changes, wkns, changes in mental status. All other systems reviewed and are otherwise negative except as noted above.    Physical Exam    Wt Readings from Last 3 Encounters:  10/24/21 214 lb 6.4 oz (97.3 kg)  09/14/21 214 lb (97.1 kg)  08/18/21 215 lb (97.5 kg)    CN:OBSJG were no vitals filed for this visit.   GEN: Well nourished, well developed, in no acute distress. Neck: Supple, no JVD, carotid bruits, or masses. Cardiac: RRR, no murmurs, rubs, or gallops. No clubbing, cyanosis, edema.  Radials/PT 2+ and equal bilaterally.  Respiratory:  Respirations regular and unlabored, clear to auscultation bilaterally. MS: no deformity or atrophy. Skin: warm and dry, no rash. Neuro:  Strength and sensation are intact. Psych: Normal affect.  Accessory Clinical Findings    ECG personally reviewed by me today -no EKG today   Risk Assessment/Calculations:    CHA2DS2-VASc Score = 5   This indicates a 7.2% annual risk of stroke. The patient's score is based upon: CHF History: 1 HTN History:  1 Diabetes History: 0 Stroke History: 0 Vascular Disease History: 1 Age Score: 2 Gender Score: 0           Lab Results  Component Value Date   WBC 7.9 09/07/2021   HGB 11.6 (L) 09/07/2021   HCT 34.5 (L) 09/07/2021  MCV 98.9 09/07/2021   PLT 182 09/07/2021   Lab Results  Component Value Date   CREATININE 1.90 (H) 09/07/2021   BUN 28 (H) 09/07/2021   NA 138 09/07/2021   K 4.2 09/07/2021   CL 102 09/07/2021   CO2 29 09/07/2021   Lab Results  Component Value Date   ALT 20 09/07/2021   AST 15 09/07/2021   ALKPHOS 60 09/07/2021   BILITOT 0.2 (L) 09/07/2021   Lab Results  Component Value Date   CHOL 106 02/07/2021   HDL 23 (L) 02/07/2021   LDLCALC 42 02/07/2021   TRIG 207 (H) 02/07/2021   CHOLHDL 4.6 02/07/2021    Lab Results  Component Value Date   HGBA1C 5.9 (H) 05/01/2021    Assessment & Plan    1.  Medtronic Viva Quad XT CRT-D -Implanted by Dr. Rayann Heman 03/2015 -device program changes today no changes -2 years to ERI, no arrhythmias pacing 97.9% of the time -Good volume on OptiVol   2.  Persistent atrial fibrillation: -s/p AF ablation 2/21 -Continue Xarelto 15 mg creatinine clearance 41 dose appropriate -Chads2vasc score is 4  3.  HFrEF: -Continue GDMT -Last echo 6/22 with LVEF: 40-45% -Patient appears euvolemic today on examination today  4.  HTN: -Pressure today was well controlled today 124/66 -Continue carvedilol 12.5 mg -On CPAP    Disposition: Follow-up with Dr. Jaymes Graff APP  in 1 year      Medication Adjustments/Labs and Tests Ordered: Current medicines are reviewed at length with the patient today.  Concerns regarding medicines are outlined above.  Tests Ordered: No orders of the defined types were placed in this encounter.  Medication Changes: No orders of the defined types were placed in this encounter.    Mable Fill, Marissa Nestle, NP 12/06/2021, 6:42 PM

## 2021-12-07 ENCOUNTER — Other Ambulatory Visit: Payer: Self-pay

## 2021-12-07 ENCOUNTER — Ambulatory Visit: Payer: Medicare PPO | Admitting: Nurse Practitioner

## 2021-12-07 ENCOUNTER — Encounter: Payer: Self-pay | Admitting: Nurse Practitioner

## 2021-12-07 ENCOUNTER — Telehealth: Payer: Self-pay

## 2021-12-07 VITALS — BP 104/66 | HR 61 | Ht 66.0 in | Wt 215.4 lb

## 2021-12-07 DIAGNOSIS — Z95810 Presence of automatic (implantable) cardiac defibrillator: Secondary | ICD-10-CM

## 2021-12-07 DIAGNOSIS — I5023 Acute on chronic systolic (congestive) heart failure: Secondary | ICD-10-CM | POA: Diagnosis not present

## 2021-12-07 LAB — CUP PACEART INCLINIC DEVICE CHECK
Battery Remaining Longevity: 16 mo
Battery Voltage: 2.87 V
Brady Statistic AP VP Percent: 97.18 %
Brady Statistic AP VS Percent: 0.1 %
Brady Statistic AS VP Percent: 2.64 %
Brady Statistic AS VS Percent: 0.08 %
Brady Statistic RA Percent Paced: 95.34 %
Brady Statistic RV Percent Paced: 97.92 %
Date Time Interrogation Session: 20230309173243
HighPow Impedance: 80 Ohm
Implantable Lead Implant Date: 20160607
Implantable Lead Implant Date: 20160607
Implantable Lead Implant Date: 20160607
Implantable Lead Location: 753858
Implantable Lead Location: 753859
Implantable Lead Location: 753860
Implantable Lead Model: 4598
Implantable Lead Model: 5076
Implantable Pulse Generator Implant Date: 20160607
Lead Channel Impedance Value: 1083 Ohm
Lead Channel Impedance Value: 1140 Ohm
Lead Channel Impedance Value: 1216 Ohm
Lead Channel Impedance Value: 1292 Ohm
Lead Channel Impedance Value: 1425 Ohm
Lead Channel Impedance Value: 1501 Ohm
Lead Channel Impedance Value: 380 Ohm
Lead Channel Impedance Value: 456 Ohm
Lead Channel Impedance Value: 513 Ohm
Lead Channel Impedance Value: 627 Ohm
Lead Channel Impedance Value: 646 Ohm
Lead Channel Impedance Value: 836 Ohm
Lead Channel Impedance Value: 855 Ohm
Lead Channel Pacing Threshold Amplitude: 0.5 V
Lead Channel Pacing Threshold Amplitude: 0.5 V
Lead Channel Pacing Threshold Amplitude: 1.5 V
Lead Channel Pacing Threshold Pulse Width: 0.4 ms
Lead Channel Pacing Threshold Pulse Width: 0.4 ms
Lead Channel Pacing Threshold Pulse Width: 0.4 ms
Lead Channel Sensing Intrinsic Amplitude: 1.625 mV
Lead Channel Sensing Intrinsic Amplitude: 12.5 mV
Lead Channel Sensing Intrinsic Amplitude: 12.75 mV
Lead Channel Sensing Intrinsic Amplitude: 2.125 mV
Lead Channel Setting Pacing Amplitude: 1.5 V
Lead Channel Setting Pacing Amplitude: 2 V
Lead Channel Setting Pacing Amplitude: 2 V
Lead Channel Setting Pacing Pulse Width: 0.4 ms
Lead Channel Setting Pacing Pulse Width: 0.4 ms
Lead Channel Setting Sensing Sensitivity: 0.3 mV

## 2021-12-07 NOTE — Telephone Encounter (Signed)
LMOVM for patient to send missed ICM transmission. 

## 2021-12-07 NOTE — Patient Instructions (Addendum)
Medication Instructions:  Your physician recommends that you continue on your current medications as directed. Please refer to the Current Medication list given to you today.  *If you need a refill on your cardiac medications before your next appointment, please call your pharmacy*   Lab Work: [place]   If you have labs (blood work) drawn today and your tests are completely normal, you will receive your results only by: [place] (if you have MyChart) OR A paper copy in the mail If you have any lab test that is abnormal or we need to change your treatment, we will call you to review the results.   Testing/Procedures: NONE ORDERED  TODAY   Follow-Up: At [facility], you and your health needs are our priority.  As part of our continuing mission to provide you with exceptional heart care, we have created designated Provider Care Teams.  These Care Teams include your primary Cardiologist (physician) and Advanced Practice Providers (APPs -  Physician Assistants and Nurse Practitioners) who all work together to provide you with the care you need, when you need it.  We recommend signing up for the patient portal called "MyChart".  Sign up information is provided on this After Visit Summary.  MyChart is used to connect with patients for Virtual Visits (Telemedicine).  Patients are able to view lab/test results, encounter notes, upcoming appointments, etc.  Non-urgent messages can be sent to your provider as well.   To learn more about what you can do with MyChart, go to [email or web address].    Your next appointment:   1 year(s)  The format for your next appointment:   In Person  Provider:   Lars Mage, MD    Other Instructions   MAKE SURE YOU [place] IF YOU GAIN 3 LBS IN A DAY 5 LBS IN  A WEEK   Heart-Healthy Eating Plan Heart-healthy meal planning includes: Eating less unhealthy fats. Eating more healthy fats. Making other  changes in your diet. Talk with your doctor or a diet specialist (dietitian) to create an eating plan that is right for you. What is my plan? Your doctor may recommend an eating plan that includes: Total fat: ______% or less of total calories a day. Saturated fat: ______% or less of total calories a day. Cholesterol: less than _________mg a day. What are tips for following this plan? Cooking Avoid frying your food. Try to bake, boil, grill, or broil it instead. You can also reduce fat by: Removing the skin from poultry. Removing all visible fats from meats. Steaming vegetables in water or broth. Meal planning  At meals, divide your plate into four equal parts: Fill one-half of your plate with vegetables and green salads. Fill one-fourth of your plate with whole grains. Fill one-fourth of your plate with lean protein foods. Eat 4-5 servings of vegetables per day. A serving of vegetables is: 1 cup of raw or cooked vegetables. 2 cups of raw leafy greens. Eat 4-5 servings of fruit per day. A serving of fruit is: 1 medium whole fruit.  cup of dried fruit.  cup of fresh, frozen, or canned fruit.  cup of 100% fruit juice. Eat more foods that have soluble fiber. These are apples, broccoli, carrots, beans, peas, and barley. Try to get 20-30 g of fiber per day. Eat 4-5 servings of nuts, legumes, and seeds per week: 1 serving of dried beans or legumes equals  cup after being cooked. 1 serving of nuts is  cup. 1 serving of seeds equals 1 tablespoon. General information Eat more home-cooked food. Eat less restaurant, buffet, and fast food. Limit or avoid alcohol. Limit foods that are high in starch and sugar. Avoid fried foods. Lose weight if you are overweight. Keep track of how much salt (sodium) you eat. This is important if you have high blood pressure. Ask your doctor to tell you more about this. Try to add vegetarian meals each week. Fats Choose healthy fats. These include  olive oil and canola oil, flaxseeds, walnuts, almonds, and seeds. Eat more omega-3 fats. These include salmon, mackerel, sardines, tuna, flaxseed oil, and ground flaxseeds. Try to eat fish at least 2 times each week. Check food labels. Avoid foods with trans fats or high amounts of saturated fat. Limit saturated fats. These are often found in animal products, such as meats, butter, and cream. These are also found in plant foods, such as palm oil, palm kernel oil, and coconut oil. Avoid foods with partially hydrogenated oils in them. These have trans fats. Examples are stick margarine, some tub margarines, cookies, crackers, and other baked goods. What foods can I eat? Fruits All fresh, canned (in natural juice), or frozen fruits. Vegetables Fresh or frozen vegetables (raw, steamed, roasted, or grilled). Green salads. Grains Most grains. Choose whole wheat and whole grains most of the time. Rice and pasta, including brown rice and pastas made with whole wheat. Meats and other proteins Lean, well-trimmed beef, veal, pork, and lamb. Chicken and [place] without skin. All fish and shellfish. Wild duck, rabbit, pheasant, and venison. Egg whites or low-cholesterol egg substitutes. Dried beans, peas, lentils, and tofu. Seeds and most nuts. Dairy Low-fat or nonfat cheeses, including ricotta and mozzarella. Skim or 1% milk that is liquid, powdered, or evaporated. Buttermilk that is made with low-fat milk. Nonfat or low-fat yogurt. Fats and oils Non-hydrogenated (trans-free) margarines. Vegetable oils, including soybean, sesame, sunflower, olive, peanut, safflower, corn, canola, and cottonseed. Salad dressings or mayonnaise made with a vegetable oil. Beverages Mineral water. Coffee and tea. Diet carbonated beverages. Sweets and desserts Sherbet, gelatin, and fruit ice. Small amounts of dark chocolate. Limit all sweets and desserts. Seasonings and condiments All seasonings and condiments. The items  listed above may not be a complete list of foods and drinks you can eat. Contact a dietitian for more options. What foods should I avoid? Fruits Canned fruit in heavy syrup. Fruit in cream or butter sauce. Fried fruit. Limit coconut. Vegetables Vegetables cooked in cheese, cream, or butter sauce. Fried vegetables. Grains Breads that are made with saturated or trans fats, oils, or whole milk. Croissants. Sweet rolls. Donuts. High-fat crackers, such as cheese crackers. Meats and other proteins Fatty meats, such as hot dogs, ribs, sausage, bacon, rib-eye roast or steak. High-fat deli meats, such as salami and bologna. Caviar. Domestic duck and goose. Organ meats, such as liver. Dairy Cream, sour cream, cream cheese, and creamed cottage cheese. Whole-milk cheeses. Whole or 2% milk that is liquid, evaporated, or condensed. Whole buttermilk. Cream sauce or high-fat cheese sauce. Yogurt that is made from whole milk. Fats and oils Meat fat, or shortening. Cocoa butter, hydrogenated oils, palm oil, coconut oil, palm kernel oil. Solid fats and shortenings, including bacon fat, salt pork, lard, and butter. Nondairy cream substitutes. Salad dressings with cheese or sour cream. Beverages Regular sodas and juice drinks with added sugar. Sweets and desserts Frosting. Pudding. Cookies. Cakes. Pies. Milk chocolate or white chocolate. Buttered syrups. Full-fat ice cream or ice cream drinks.  The items listed above may not be a complete list of foods and drinks to avoid. Contact a dietitian for more information. Summary Heart-healthy meal planning includes eating less unhealthy fats, eating more healthy fats, and making other changes in your diet. Eat a balanced diet. This includes fruits and vegetables, low-fat or nonfat dairy, lean protein, nuts and legumes, whole grains, and heart-healthy oils and fats. This information is not intended to replace advice given to you by your health care provider. Make sure you  discuss any questions you have with your health care provider. Document Revised: 01/26/2021 Document Reviewed: 01/26/2021 Elsevier Patient Education  2022 [place]. Low-Sodium Eating Plan Sodium, which is an element that makes up salt, helps you maintain a healthy balance of fluids in your body. Too much sodium can increase your blood pressure and cause fluid and waste to be held in your body. Your health care provider or dietitian may recommend following this plan if you have high blood pressure (hypertension), kidney disease, liver disease, or heart failure. Eating less sodium can help lower your blood pressure, reduce swelling, and protect your heart, liver, and kidneys. What are tips for following this plan? Reading food labels The Nutrition Facts label lists the amount of sodium in one serving of the food. If you eat more than one serving, you must multiply the listed amount of sodium by the number of servings. Choose foods with less than 140 mg of sodium per serving. Avoid foods with 300 mg of sodium or more per serving. Shopping  Look for lower-sodium products, often labeled as "low-sodium" or "no salt added." Always check the sodium content, even if foods are labeled as "unsalted" or "no salt added." Buy fresh foods. Avoid canned foods and pre-made or frozen meals. Avoid canned, cured, or processed meats. Buy breads that have less than 80 mg of sodium per slice. Cooking  Eat more home-cooked food and less restaurant, buffet, and fast food. Avoid adding salt when cooking. Use salt-free seasonings or herbs instead of table salt or sea salt. Check with your health care provider or pharmacist before using salt substitutes. Cook with plant-based oils, such as canola, sunflower, or olive oil. Meal planning When eating at a restaurant, ask that your food be prepared with less salt or no salt, if possible. Avoid dishes labeled as brined, pickled, cured, smoked, or made with soy sauce,  miso, or teriyaki sauce. Avoid foods that contain MSG (monosodium glutamate). MSG is sometimes added to [place] food, bouillon, and some canned foods. Make meals that can be grilled, baked, poached, roasted, or steamed. These are generally made with less sodium. General information Most people on this plan should limit their sodium intake to 1,500-2,000 mg (milligrams) of sodium each day. What foods should I eat? Fruits Fresh, frozen, or canned fruit. Fruit juice. Vegetables Fresh or frozen vegetables. "No salt added" canned vegetables. "No salt added" tomato sauce and paste. Low-sodium or reduced-sodium tomato and vegetable juice. Grains Low-sodium cereals, including oats, puffed wheat and rice, and shredded wheat. Low-sodium crackers. Unsalted rice. Unsalted pasta. Low-sodium bread. Whole-grain breads and whole-grain pasta. Meats and other proteins Fresh or frozen (no salt added) meat, poultry, seafood, and fish. Low-sodium canned tuna and salmon. Unsalted nuts. Dried peas, beans, and lentils without added salt. Unsalted canned beans. Eggs. Unsalted nut butters. Dairy Milk. Soy milk. Cheese that is naturally low in sodium, such as ricotta cheese, fresh mozzarella, or Swiss cheese. Low-sodium or reduced-sodium cheese. Cream cheese. Yogurt. Seasonings and condiments Fresh  and dried herbs and spices. Salt-free seasonings. Low-sodium mustard and ketchup. Sodium-free salad dressing. Sodium-free light mayonnaise. Fresh or refrigerated horseradish. Lemon juice. Vinegar. Other foods Homemade, reduced-sodium, or low-sodium soups. Unsalted popcorn and pretzels. Low-salt or salt-free chips. The items listed above may not be a complete list of foods and beverages you can eat. Contact a dietitian for more information. What foods should I avoid? Vegetables Sauerkraut, pickled vegetables, and relishes. Olives. [place] fries. Onion rings. Regular canned vegetables (not low-sodium or reduced-sodium). Regular  canned tomato sauce and paste (not low-sodium or reduced-sodium). Regular tomato and vegetable juice (not low-sodium or reduced-sodium). Frozen vegetables in sauces. Grains Instant hot cereals. Bread stuffing, pancake, and biscuit mixes. Croutons. Seasoned rice or pasta mixes. Noodle soup cups. Boxed or frozen macaroni and cheese. Regular salted crackers. Self-rising flour. Meats and other proteins Meat or fish that is salted, canned, smoked, spiced, or pickled. Precooked or cured meat, such as sausages or meat loaves. Bernie[place] Salines. Ham. Pepperoni. Hot dogs. Corned beef. Chipped beef. Salt pork. Jerky. Pickled herring. Anchovies and sardines. Regular canned tuna. Salted nuts. Dairy Processed cheese and cheese spreads. Hard cheeses. Cheese curds. Blue cheese. Feta cheese. String cheese. Regular cottage cheese. Buttermilk. Canned milk. Fats and oils Salted butter. Regular margarine. Ghee. Bacon fat. Seasonings and condiments Onion salt, garlic salt, seasoned salt, table salt, and sea salt. Canned and packaged gravies. Worcestershire sauce. Tartar sauce. Barbecue sauce. Teriyaki sauce. Soy sauce, including reduced-sodium. Steak sauce. Fish sauce. Oyster sauce. Cocktail sauce. Horseradish that you find on the shelf. Regular ketchup and mustard. Meat flavorings and tenderizers. Bouillon cubes. Hot sauce. Pre-made or packaged marinades. Pre-made or packaged taco seasonings. Relishes. Regular salad dressings. Salsa. Other foods Salted popcorn and pretzels. Corn chips and puffs. Potato and tortilla chips. Canned or dried soups. Pizza. Frozen entrees and pot pies. The items listed above may not be a complete list of foods and beverages you should avoid. Contact a dietitian for more information. Summary Eating less sodium can help lower your blood pressure, reduce swelling, and protect your heart, liver, and kidneys. Most people on this plan should limit their sodium intake to 1,500-2,000 mg (milligrams) of sodium  each day. Canned, boxed, and frozen foods are high in sodium. Restaurant foods, fast foods, and pizza are also very high in sodium. You also get sodium by adding salt to food. Try to cook at home, eat more fresh fruits and vegetables, and eat less fast food and canned, processed, or prepared foods. This information is not intended to replace advice given to you by your health care provider. Make sure you discuss any questions you have with your health care provider. Document Revised: 10/23/2019 Document Reviewed: 08/19/2019 Elsevier Patient Education  2022 [employer].

## 2021-12-07 NOTE — Progress Notes (Signed)
Remote ICD transmission.   

## 2021-12-08 NOTE — Progress Notes (Signed)
No ICM remote transmission received for 12/04/2021 and next ICM transmission scheduled for 12/25/2021.   ?

## 2021-12-25 ENCOUNTER — Ambulatory Visit (INDEPENDENT_AMBULATORY_CARE_PROVIDER_SITE_OTHER): Payer: Medicare PPO

## 2021-12-25 DIAGNOSIS — Z95810 Presence of automatic (implantable) cardiac defibrillator: Secondary | ICD-10-CM | POA: Diagnosis not present

## 2021-12-25 DIAGNOSIS — I5022 Chronic systolic (congestive) heart failure: Secondary | ICD-10-CM | POA: Diagnosis not present

## 2021-12-27 ENCOUNTER — Telehealth: Payer: Self-pay

## 2021-12-27 NOTE — Progress Notes (Signed)
EPIC Encounter for ICM Monitoring ? ?Patient Name: Chad Brown is a 81 y.o. male ?Date: 12/27/2021 ?Primary Care Physican: Ginger Organ., MD ?Primary Cardiologist: Nishan/McLean ?Electrophysiologist: Allred ?Bi-V Pacing: 97.0%   ?11/03/2021 Weight: 212-213 lbs  ?  ?Time in AT/AF <0.1 hr/day (<0.1%)   (taking Xarelto) ?Longest AT/AF 31 seconds  ?  ?Attempted call to patient and unable to reach.  Left detailed message per DPR regarding transmission. Transmission reviewed.  ?  ?Optivol thoracic impedance suggesting normal fluid levels. ?  ?Prescribed:  ?Furosemide 40 mg 1 tablet (40 mg total) by mouth in the morning and 0.5 tablet (20 mg total)  every evening. ?Spironolactone 25 mg Take 1 tablet (25 mg total) by mouth daily. Take at bedtime ?  ?Labs: ?05/01/2021 Creatinine 1.43, BUN 24, Potassium 4.6, Sodium 142, GFR 50 ?03/09/2021 Creatinine 1.40, BUN 18, Potassium 3.7, Sodium 139, GFR 51 ?02/17/2021 Creatinine 1.64, BUN 17, Potassium 3.9, Sodium 139, GFR 42 ?02/07/2021 Creatinine 1.43, BUN 14,Potassium 3.8, Sodium 141,  GFR 50  ?10/27/2020 Creatinine 1.34, BUN 17, Potassium 3.3, Sodium 137, GFR 54 ?A complete set of results can be found in Results Review. ?  ?Recommendations:  Left voice mail with ICM number and encouraged to call if experiencing any fluid symptoms.  ?  ?Follow-up plan: ICM clinic phone appointment on 01/29/2022.  91 day device clinic remote transmission 03/01/2022.   ?  ?EP/Cardiology Next Office Visit:     Recall 04/10/2022 with Tommye Standard, PA.   ?  ?Copy of ICM check sent to Dr. Rayann Heman.    ? ?3 month ICM trend: 12/25/2021. ? ? ? ?12-14 Month ICM trend:  ? ? ? ?Rosalene Billings, RN ?12/27/2021 ?9:49 AM ? ?

## 2021-12-27 NOTE — Telephone Encounter (Signed)
Remote ICM transmission received.  Attempted call to patient regarding ICM remote transmission and left detailed message per DPR.  Advised to return call for any fluid symptoms or questions. Next ICM remote transmission scheduled 01/29/2022.   ? ?

## 2022-01-05 DIAGNOSIS — I5042 Chronic combined systolic (congestive) and diastolic (congestive) heart failure: Secondary | ICD-10-CM | POA: Diagnosis not present

## 2022-01-05 DIAGNOSIS — D696 Thrombocytopenia, unspecified: Secondary | ICD-10-CM | POA: Diagnosis not present

## 2022-01-05 DIAGNOSIS — D638 Anemia in other chronic diseases classified elsewhere: Secondary | ICD-10-CM | POA: Diagnosis not present

## 2022-01-05 DIAGNOSIS — N1832 Chronic kidney disease, stage 3b: Secondary | ICD-10-CM | POA: Diagnosis not present

## 2022-01-05 DIAGNOSIS — E211 Secondary hyperparathyroidism, not elsewhere classified: Secondary | ICD-10-CM | POA: Diagnosis not present

## 2022-01-05 DIAGNOSIS — I129 Hypertensive chronic kidney disease with stage 1 through stage 4 chronic kidney disease, or unspecified chronic kidney disease: Secondary | ICD-10-CM | POA: Diagnosis not present

## 2022-01-11 DIAGNOSIS — D638 Anemia in other chronic diseases classified elsewhere: Secondary | ICD-10-CM | POA: Diagnosis not present

## 2022-01-11 DIAGNOSIS — I5042 Chronic combined systolic (congestive) and diastolic (congestive) heart failure: Secondary | ICD-10-CM | POA: Diagnosis not present

## 2022-01-11 DIAGNOSIS — D696 Thrombocytopenia, unspecified: Secondary | ICD-10-CM | POA: Diagnosis not present

## 2022-01-11 DIAGNOSIS — I129 Hypertensive chronic kidney disease with stage 1 through stage 4 chronic kidney disease, or unspecified chronic kidney disease: Secondary | ICD-10-CM | POA: Diagnosis not present

## 2022-01-11 DIAGNOSIS — E211 Secondary hyperparathyroidism, not elsewhere classified: Secondary | ICD-10-CM | POA: Diagnosis not present

## 2022-01-11 DIAGNOSIS — N1832 Chronic kidney disease, stage 3b: Secondary | ICD-10-CM | POA: Diagnosis not present

## 2022-01-29 ENCOUNTER — Ambulatory Visit (INDEPENDENT_AMBULATORY_CARE_PROVIDER_SITE_OTHER): Payer: Medicare PPO

## 2022-01-29 ENCOUNTER — Other Ambulatory Visit (HOSPITAL_COMMUNITY): Payer: Self-pay | Admitting: Cardiology

## 2022-01-29 DIAGNOSIS — I5022 Chronic systolic (congestive) heart failure: Secondary | ICD-10-CM | POA: Diagnosis not present

## 2022-01-29 DIAGNOSIS — Z95810 Presence of automatic (implantable) cardiac defibrillator: Secondary | ICD-10-CM

## 2022-02-02 ENCOUNTER — Telehealth: Payer: Self-pay

## 2022-02-02 NOTE — Progress Notes (Signed)
EPIC Encounter for ICM Monitoring ? ?Patient Name: Chad Brown is a 81 y.o. male ?Date: 02/02/2022 ?Primary Care Physican: Ginger Organ., MD ?Primary Cardiologist: Nishan/McLean ?Electrophysiologist: Allred ?Bi-V Pacing: 97.2%   ?11/03/2021 Weight: 212-213 lbs  ?  ?Time in AT/AF  <0.1 hr/day (<0.1%)   (taking Xarelto) ?Longest AT/AF 2 minutes ?  ?Attempted call to patient and unable to reach.  Left detailed message per DPR regarding transmission. Transmission reviewed.  ?  ?Optivol thoracic impedance suggesting normal fluid levels. ?  ?Prescribed:  ?Furosemide 40 mg 1 tablet (40 mg total) by mouth in the morning and 0.5 tablet (20 mg total)  every evening. ?Spironolactone 25 mg Take 1 tablet (25 mg total) by mouth daily. Take at bedtime ?  ?Labs: ?05/01/2021 Creatinine 1.43, BUN 24, Potassium 4.6, Sodium 142, GFR 50 ?03/09/2021 Creatinine 1.40, BUN 18, Potassium 3.7, Sodium 139, GFR 51 ?02/17/2021 Creatinine 1.64, BUN 17, Potassium 3.9, Sodium 139, GFR 42 ?02/07/2021 Creatinine 1.43, BUN 14,Potassium 3.8, Sodium 141,  GFR 50  ?10/27/2020 Creatinine 1.34, BUN 17, Potassium 3.3, Sodium 137, GFR 54 ?A complete set of results can be found in Results Review. ?  ?Recommendations:  Left voice mail with ICM number and encouraged to call if experiencing any fluid symptoms.  ?  ?Follow-up plan: ICM clinic phone appointment on 03/05/2022.  91 day device clinic remote transmission 03/01/2022.   ?  ?EP/Cardiology Next Office Visit:     Recall 04/10/2022 with Tommye Standard, PA.   ?  ?Copy of ICM check sent to Dr. Rayann Heman.    ?  ?3 month ICM trend: 01/29/2022. ? ? ? ?12-14 Month ICM trend:  ? ? ? ?Rosalene Billings, RN ?02/02/2022 ?10:27 AM ? ?

## 2022-02-02 NOTE — Telephone Encounter (Signed)
Remote ICM transmission received.  Attempted call to patient regarding ICM remote transmission and left detailed message per DPR.  Advised to return call for any fluid symptoms or questions. Next ICM remote transmission scheduled 03/05/2022.   ? ?

## 2022-02-12 ENCOUNTER — Other Ambulatory Visit (HOSPITAL_COMMUNITY): Payer: Self-pay | Admitting: Cardiology

## 2022-02-19 DIAGNOSIS — L570 Actinic keratosis: Secondary | ICD-10-CM | POA: Diagnosis not present

## 2022-02-19 DIAGNOSIS — Z1283 Encounter for screening for malignant neoplasm of skin: Secondary | ICD-10-CM | POA: Diagnosis not present

## 2022-02-19 DIAGNOSIS — C44619 Basal cell carcinoma of skin of left upper limb, including shoulder: Secondary | ICD-10-CM | POA: Diagnosis not present

## 2022-02-19 DIAGNOSIS — Z08 Encounter for follow-up examination after completed treatment for malignant neoplasm: Secondary | ICD-10-CM | POA: Diagnosis not present

## 2022-02-19 DIAGNOSIS — D225 Melanocytic nevi of trunk: Secondary | ICD-10-CM | POA: Diagnosis not present

## 2022-02-19 DIAGNOSIS — Z85820 Personal history of malignant melanoma of skin: Secondary | ICD-10-CM | POA: Diagnosis not present

## 2022-02-19 DIAGNOSIS — C44319 Basal cell carcinoma of skin of other parts of face: Secondary | ICD-10-CM | POA: Diagnosis not present

## 2022-02-19 DIAGNOSIS — X32XXXD Exposure to sunlight, subsequent encounter: Secondary | ICD-10-CM | POA: Diagnosis not present

## 2022-02-21 DIAGNOSIS — D3191 Benign neoplasm of unspecified part of right eye: Secondary | ICD-10-CM | POA: Diagnosis not present

## 2022-02-21 DIAGNOSIS — H2512 Age-related nuclear cataract, left eye: Secondary | ICD-10-CM | POA: Diagnosis not present

## 2022-02-21 DIAGNOSIS — H40012 Open angle with borderline findings, low risk, left eye: Secondary | ICD-10-CM | POA: Diagnosis not present

## 2022-02-21 DIAGNOSIS — H401113 Primary open-angle glaucoma, right eye, severe stage: Secondary | ICD-10-CM | POA: Diagnosis not present

## 2022-02-21 DIAGNOSIS — Z961 Presence of intraocular lens: Secondary | ICD-10-CM | POA: Diagnosis not present

## 2022-03-01 ENCOUNTER — Ambulatory Visit (INDEPENDENT_AMBULATORY_CARE_PROVIDER_SITE_OTHER): Payer: Medicare PPO

## 2022-03-01 DIAGNOSIS — I428 Other cardiomyopathies: Secondary | ICD-10-CM

## 2022-03-02 ENCOUNTER — Other Ambulatory Visit (HOSPITAL_COMMUNITY): Payer: Self-pay | Admitting: Cardiology

## 2022-03-03 LAB — CUP PACEART REMOTE DEVICE CHECK
Battery Remaining Longevity: 14 mo
Battery Voltage: 2.88 V
Brady Statistic AP VP Percent: 97.8 %
Brady Statistic AP VS Percent: 0.11 %
Brady Statistic AS VP Percent: 1.67 %
Brady Statistic AS VS Percent: 0.42 %
Brady Statistic RA Percent Paced: 95.13 %
Brady Statistic RV Percent Paced: 96.84 %
Date Time Interrogation Session: 20230601033326
HighPow Impedance: 83 Ohm
Implantable Lead Implant Date: 20160607
Implantable Lead Implant Date: 20160607
Implantable Lead Implant Date: 20160607
Implantable Lead Location: 753858
Implantable Lead Location: 753859
Implantable Lead Location: 753860
Implantable Lead Model: 4598
Implantable Lead Model: 5076
Implantable Pulse Generator Implant Date: 20160607
Lead Channel Impedance Value: 1026 Ohm
Lead Channel Impedance Value: 1159 Ohm
Lead Channel Impedance Value: 1273 Ohm
Lead Channel Impedance Value: 1425 Ohm
Lead Channel Impedance Value: 1501 Ohm
Lead Channel Impedance Value: 380 Ohm
Lead Channel Impedance Value: 399 Ohm
Lead Channel Impedance Value: 532 Ohm
Lead Channel Impedance Value: 627 Ohm
Lead Channel Impedance Value: 665 Ohm
Lead Channel Impedance Value: 855 Ohm
Lead Channel Impedance Value: 855 Ohm
Lead Channel Impedance Value: 988 Ohm
Lead Channel Pacing Threshold Amplitude: 0.5 V
Lead Channel Pacing Threshold Amplitude: 0.5 V
Lead Channel Pacing Threshold Amplitude: 1.75 V
Lead Channel Pacing Threshold Pulse Width: 0.4 ms
Lead Channel Pacing Threshold Pulse Width: 0.4 ms
Lead Channel Pacing Threshold Pulse Width: 0.4 ms
Lead Channel Sensing Intrinsic Amplitude: 1.25 mV
Lead Channel Sensing Intrinsic Amplitude: 1.25 mV
Lead Channel Sensing Intrinsic Amplitude: 13 mV
Lead Channel Sensing Intrinsic Amplitude: 13 mV
Lead Channel Setting Pacing Amplitude: 1.5 V
Lead Channel Setting Pacing Amplitude: 2 V
Lead Channel Setting Pacing Amplitude: 2.25 V
Lead Channel Setting Pacing Pulse Width: 0.4 ms
Lead Channel Setting Pacing Pulse Width: 0.4 ms
Lead Channel Setting Sensing Sensitivity: 0.3 mV

## 2022-03-05 ENCOUNTER — Ambulatory Visit (INDEPENDENT_AMBULATORY_CARE_PROVIDER_SITE_OTHER): Payer: Medicare PPO

## 2022-03-05 DIAGNOSIS — I5022 Chronic systolic (congestive) heart failure: Secondary | ICD-10-CM | POA: Diagnosis not present

## 2022-03-05 DIAGNOSIS — Z95810 Presence of automatic (implantable) cardiac defibrillator: Secondary | ICD-10-CM

## 2022-03-07 NOTE — Progress Notes (Signed)
EPIC Encounter for ICM Monitoring  Patient Name: Chad Brown is a 81 y.o. male Date: 03/07/2022 Primary Care Physican: Ginger Organ., MD Primary Cardiologist: Nishan/McLean Electrophysiologist: Allred Bi-V Pacing: 95%   11/03/2021 Weight: 212-213 lbs  03/07/2022 Weight: 209-212 lbs    Time in AT/AF  0.0 hr/day (0.0%) (taking Xarelto)    Spoke with patient and heart failure questions reviewed.  Pt asymptomatic for fluid accumulation.  Reports feeling well at this time and voices no complaints.    Optivol thoracic impedance suggesting normal fluid levels.   Prescribed:  Furosemide 40 mg 1 tablet (40 mg total) by mouth in the morning and 0.5 tablet (20 mg total)  every evening. Spironolactone 25 mg Take 1 tablet (25 mg total) by mouth daily. Take at bedtime   Labs: 05/01/2021 Creatinine 1.43, BUN 24, Potassium 4.6, Sodium 142, GFR 50 03/09/2021 Creatinine 1.40, BUN 18, Potassium 3.7, Sodium 139, GFR 51 02/17/2021 Creatinine 1.64, BUN 17, Potassium 3.9, Sodium 139, GFR 42 02/07/2021 Creatinine 1.43, BUN 14,Potassium 3.8, Sodium 141,  GFR 50  10/27/2020 Creatinine 1.34, BUN 17, Potassium 3.3, Sodium 137, GFR 54 A complete set of results can be found in Results Review.   Recommendations:  No changes and encouraged to call if experiencing any fluid symptoms.   Follow-up plan: ICM clinic phone appointment on 04/09/2022.  91 day device clinic remote transmission 03/01/2022.     EP/Cardiology Next Office Visit:   12/02/2022 with Dr Quentin Ore.  Last EP visit was 10/2020.  Message sent to EP scheduler to call patient to schedule overdue EP OV.    Advised patient is due for 6 month follow up with Dr Aundra Dubin.      Copy of ICM check sent to Dr. Rayann Heman.      3 month ICM trend: 03/05/2022.    12-14 Month ICM trend:     Rosalene Billings, RN 03/07/2022 2:23 PM

## 2022-03-08 NOTE — Progress Notes (Signed)
Electrophysiology Office Note Date: 03/09/2022  ID:  BENCE TRAPP, DOB [birth year], MRN [id number]  PCP: Ginger Organ., MD Primary Cardiologist: [name], MD Electrophysiologist: Thompson Grayer, MD   CC: Routine ICD follow-up  Chad Brown is a 81 y.o. male seen today for Thompson Grayer, MD for routine electrophysiology followup.  Since last being seen in our clinic the patient reports doing very well overall.  he denies chest pain, palpitations, dyspnea, PND, orthopnea, nausea, vomiting, dizziness, syncope, edema, weight gain, or early satiety. He has not had ICD shocks.   Device History: Medtronic BiV ICD implanted 03/2015 for NICM  Past Medical History:  Diagnosis Date   Adenomatous colon polyp    tubular   AICD (automatic cardioverter/defibrillator) present 03/08/2015   MDT CRTD dual pacemaker and defib   Anemia    iron deficient   Arthritis    "about all my joints; hands, knees, back" (03/08/2015)   Atherosclerosis    Cataract    left eye small   Cholelithiasis    gallstones   Chronic systolic CHF (congestive heart failure) ([place])    a. New dx 12/2012 ? NICM, may be r/t afib. b. Nuc 03/2013 - normal;  c. 03/2015 TEE EF 15-20%.   Colon polyp, hyperplastic 01/2015   removed precancerous lesions   Depression    Diverticulosis    Dysrhythmia    afib   GERD (gastroesophageal reflux disease)    Glaucoma    right eye   Hyperlipidemia    Hypertension    Melanoma of eye ([place]) 2000's   "right; it's never been biopsied"   Melanoma of lower leg ([place]) 2015   "left; right at my knee"   Myocardial infarction ([place]) 1998   OSA (obstructive sleep apnea) 01/04/2016   no longer tolerates cpap   Peripheral vision loss, right 2006   Persistent atrial fibrillation ([place])    a. Dx 12/2012, s/p TEE/DCCV 01/26/13. b. On Xarelto (CHA2DS2VASc = 3);  c. 03/2015 TEE (EF 15-20%, no LAA thrombus) and DCCV - amio increased to 200 mg bid.   Pneumonia    Urinary hesitancy due to benign  prostatic hypertrophy    Past Surgical History:  Procedure Laterality Date   ATRIAL FIBRILLATION ABLATION N/A 11/03/2019   Procedure: ATRIAL FIBRILLATION ABLATION;  Surgeon: Thompson Grayer, MD;  Location: [facility];  Service: Cardiovascular;  Laterality: N/A;   BACK SURGERY     upper back, cannot turn neck well   [place] N/A 01/26/2013   Procedure: CARDIOVERSION;  Surgeon: Lelon Perla, MD;  Location: [facility];  Service: Cardiovascular;  Laterality: N/A;   CARDIOVERSION N/A 03/23/2015   Procedure: CARDIOVERSION;  Surgeon: Jerline Pain, MD;  Location: [place];  Service: Cardiovascular;  Laterality: N/A;   CARDIOVERSION N/A 08/14/2017   Procedure: CARDIOVERSION;  Surgeon: Josue Hector, MD;  Location: [facility];  Service: Cardiovascular;  Laterality: N/A;   CATARACT EXTRACTION Right ~ 2006   COLONOSCOPY WITH PROPOFOL N/A 02/10/2015   Procedure: COLONOSCOPY WITH PROPOFOL;  Surgeon: Gatha Mayer, MD;  Location: [facility];  Service: Endoscopy;  Laterality: N/A;   COLONOSCOPY WITH PROPOFOL N/A 08/07/2016   Procedure: COLONOSCOPY WITH PROPOFOL;  Surgeon: Gatha Mayer, MD;  Location: [facility];  Service: Endoscopy;  Laterality: N/A;   ENTEROSCOPY N/A 08/17/2015   Procedure: ENTEROSCOPY;  Surgeon: Gatha Mayer, MD;  Location: [facility];  Service: Endoscopy;  Laterality: N/A;   EP  IMPLANTABLE DEVICE N/A 03/08/2015   MDT Hillery Aldo CRT-D for nonischemic CM by Dr Rayann Heman for primary prevention   GLAUCOMA SURGERY Right ~ 2006   "put 3 stents in to drain fluid" (03/08/2015) not successful, sent to [facility] to try to get last stent out   HOT HEMOSTASIS N/A 08/07/2016   Procedure: HOT HEMOSTASIS (ARGON PLASMA COAGULATION/BICAP);  Surgeon: Gatha Mayer, MD;  Location: [facility];  Service: Endoscopy;  Laterality: N/A;   INCISION AND DRAINAGE ABSCESS POSTERIOR CERVICALSPINE  05/2012   JOINT REPLACEMENT     MELANOMA EXCISION Left 2015    "lower leg; right at my knee"   REFRACTIVE SURGERY Right ~ 2006 X 2   "twice; both done at [facility]" (03/08/2015   SURGERY SCROTAL / TESTICULAR Right 1990's   TEE WITHOUT CARDIOVERSION N/A 01/26/2013   Procedure: TRANSESOPHAGEAL ECHOCARDIOGRAM (TEE);  Surgeon: Lelon Perla, MD;  Location: [facility];  Service: Cardiovascular;  Laterality: N/A;  [name] anes. /    TEE WITHOUT CARDIOVERSION N/A 10/05/2014   Procedure: TRANSESOPHAGEAL ECHOCARDIOGRAM (TEE)  with cardioversion;  Surgeon: Thayer Headings, MD;  Location[facility];  Service: Cardiovascular;  Laterality: N/A;  12:52 synched cardioversion at 120 joules,...afib to SR...12 lead EKG ordered.Marland KitchenMarland KitchenCardiozem d/c'ed per MD verbal order at SR   TEE WITHOUT CARDIOVERSION N/A 03/23/2015   Procedure: TRANSESOPHAGEAL ECHOCARDIOGRAM (TEE);  Surgeon: Jerline Pain, MD;  Location: [place];  Service: Cardiovascular;  Laterality: N/A;   TEE WITHOUT CARDIOVERSION N/A 11/02/2019   Procedure: TRANSESOPHAGEAL ECHOCARDIOGRAM (TEE);  Surgeon: Josue Hector, MD;  Location: [facility];  Service: Cardiovascular;  Laterality: N/A;   THORACIC SPINE SURGERY  03/2000   "ground calcium deposits from upper thoracic" (01/26/2013)   THYROIDECTOMY N/A 05/11/2021   Procedure: TOTAL THYROIDECTOMY;  Surgeon: Armandina Gemma, MD;  Location: [facility];  Service: General;  Laterality: N/A;   TOTAL HIP ARTHROPLASTY Right 06/2007    Current Outpatient Medications  Medication Sig Dispense Refill   calcitRIOL (ROCALTROL) 0.25 MCG capsule Take 1 capsule by mouth 2 (two) times a week.     calcium carbonate (TUMS - DOSED IN MG ELEMENTAL CALCIUM) 500 MG chewable tablet Chew 1 tablet by mouth daily.     carvedilol (COREG) 12.5 MG tablet Take 1 tablet (12.5 mg total) by mouth 2 (two) times daily. 180 tablet 3   dorzolamide-timolol (COSOPT) 22.3-6.8 MG/ML ophthalmic solution Place 1 drop into the right eye 2 (two) times daily.     escitalopram (LEXAPRO) 20 MG tablet Take 1 tablet (20 mg total)  by mouth daily. 90 tablet 0   ferrous sulfate 325 (65 FE) MG tablet Take 325 mg by mouth daily with breakfast.     finasteride (PROSCAR) 5 MG tablet Take 1 tablet (5 mg total) by mouth daily. 30 tablet 2   furosemide (LASIX) 40 MG tablet TAKE 1 TABLET EVERY MORNING & 1/2 TABLET EVERY EVENING 135 tablet 3   gabapentin (NEURONTIN) 100 MG capsule Take 200 mg by mouth 2 (two) times daily.      JARDIANCE 10 MG TABS tablet TAKE 1 TABLET DAILY BEFORE BREAKFAST 90 tablet 3   latanoprost (XALATAN) 0.005 % ophthalmic solution Place 1 drop into the right eye at bedtime.     levothyroxine (SYNTHROID) 125 MCG tablet Take 1 tablet (125 mcg total) by mouth daily. 90 tablet 3   nitroGLYCERIN (NITROSTAT) 0.4 MG SL tablet Place 0.4 mg under the tongue every 5 (five) minutes as needed for chest pain.     Polyethyl  Glycol-Propyl Glycol 0.4-0.3 % SOLN Place 1 drop into the left eye in the morning and at bedtime.     REPATHA SURECLICK 355 MG/ML SOAJ Inject 1 pen into the skin every 14 (fourteen) days. 2 mL 11   RHOPRESSA 0.02 % SOLN Place 1 drop into the right eye at bedtime.     Rivaroxaban (XARELTO) 15 MG TABS tablet Take 1 tablet (15 mg total) by mouth daily with supper. 90 tablet 1   sacubitril-valsartan (ENTRESTO) 24-26 MG Take 0.5 tablets by mouth 2 (two) times daily. 60 tablet 11   spironolactone (ALDACTONE) 25 MG tablet Take 25 mg by mouth daily. Pt takes 1/2 table 12.5 mg in the morning and 1/2 table 12.5 mg at night.     spironolactone (ALDACTONE) 25 MG tablet Take 1 tablet (25 mg total) by mouth at bedtime. PLEASE CALL TO SCHEDULE FOLLOW UP APPOINTMENT. 90 tablet 3   tamsulosin (FLOMAX) 0.4 MG CAPS Take 0.4 mg by mouth at bedtime.      VITAMIN D PO Take 1 tablet by mouth daily.     No current facility-administered medications for this visit.    Allergies:   Pravastatin sodium and Azithromycin   Social History: Social History   Socioeconomic History   Marital status: Married    Spouse name: Not on  file   Number of children: 3   Years of education: Not on file   Highest education level: Not on file  Occupational History   Occupation: retired  Tobacco Use   Smoking status: Former    Packs/day: 3.00    Years: 48.00    Total pack years: 144.00    Types: Cigarettes    Quit date: 09/28/2000    Years since quitting: 21.4   Smokeless tobacco: Never  Vaping Use   Vaping Use: Never used  Substance and Sexual Activity   Alcohol use: No   Drug use: No   Sexual activity: Not Currently  Other Topics Concern   Not on file  Social History Narrative   Pt lives in [place] with spouse.  3 children are grown and healthy.   Retired.  Ran a country store for 30 years, previously worked in the [employer] for 16 years   Social Determinants of Radio broadcast assistant Strain: Not on [employer] Insecurity: Not on file  Transportation Needs: No Transportation Needs (11/03/2020)   PRAPARE - Hydrologist (Medical): No    Lack of Transportation (Non-Medical): No  Physical Activity: Inactive (11/03/2020)   Exercise Vital Sign    Days of Exercise per Week: 0 days    Minutes of Exercise per Session: 0 min  Stress: Not on file  Social Connections: Not on file  Intimate Partner Violence: Not At Risk (11/03/2020)   Humiliation, Afraid, Rape, and Kick questionnaire    Fear of Current or Ex-Partner: No    Emotionally Abused: No    Physically Abused: No    Sexually Abused: No    Family History: Family History  Problem Relation Age of Onset   Kidney disease Mother    Heart disease Mother        MI, open heart   Diabetes Mother        dialysis   Leukemia Father    Colon cancer Paternal Uncle    Lung cancer Paternal Uncle        x 2   Prostate cancer Paternal Uncle    Diabetes Maternal  Grandmother    Heart attack Maternal Uncle    Diabetes Maternal Aunt        x 3   Diabetes Maternal Uncle    Thyroid disease Sister     Review of Systems: All  other systems reviewed and are otherwise negative except as noted above.   Physical Exam: Vitals:   03/09/22 0843  BP: 108/64  Pulse: 62  SpO2: 97%  Weight: 218 lb 12.8 oz (99.2 kg)  Height: '5\' 6"'$  (1.676 m)     GEN- The patient is well appearing, alert and oriented x 3 today.   HEENT: normocephalic, atraumatic; sclera clear, conjunctiva pink; hearing intact; oropharynx clear; neck supple, no JVP Lymph- no cervical lymphadenopathy Lungs- Clear to ausculation bilaterally, normal work of breathing.  No wheezes, rales, rhonchi Heart- Regular rate and rhythm, no murmurs, rubs or gallops, PMI not laterally displaced GI- soft, non-tender, non-distended, bowel sounds present, no hepatosplenomegaly Extremities- no clubbing or cyanosis. No edema; DP/PT/radial pulses 2+ bilaterally MS- no significant deformity or atrophy Skin- warm and dry, no rash or lesion; ICD pocket well healed Psych- euthymic mood, full affect Neuro- strength and sensation are intact  ICD interrogation- reviewed in detail today,  See PACEART report  EKG:  EKG is not ordered today. Personal review of EKG 08/2021 shows AV dual paced rhythm at 64 bpm  Recent Labs: 08/02/2021: TSH 9.19 08/18/2021: B Natriuretic Peptide 235.6 09/07/2021: ALT 20; BUN 28; Creatinine, Ser 1.90; Hemoglobin 11.6; Platelets 182; Potassium 4.2; Sodium 138   Wt Readings from Last 3 Encounters:  03/09/22 218 lb 12.8 oz (99.2 kg)  12/07/21 215 lb 6.4 oz (97.7 kg)  10/24/21 214 lb 6.4 oz (97.3 kg)     Other studies Reviewed: Additional studies/ records that were reviewed today include: Previous EP office notes.   Assessment and Plan:  1.  Chronic systolic dysfunction s/p Medtronic CRT-D  euvolemic today Stable on an appropriate medical regimen Normal ICD function See P[facility] Art report No changes today  2. Persistent atrial fibrillation Continue Xarelto 15 mg daily Well controlled off amiodarone. Burden <1%.   3. OSA Compliant with  CPAP  Current medicines are reviewed at length with the patient today.    Labs/ tests ordered today include:  Orders Placed This Encounter  Procedures   EKG 12-Lead     Disposition:   Follow up with EP APP in 6 months, then Dr. Curt Bears to assume care from Dr. Rayann Heman    Signed, Shirley Friar, PA-C  03/09/2022 10:28 AM  [facility] [address] [place] [address] [phone] (office) [phone] (fax)

## 2022-03-09 ENCOUNTER — Ambulatory Visit (INDEPENDENT_AMBULATORY_CARE_PROVIDER_SITE_OTHER): Payer: Medicare PPO | Admitting: Student

## 2022-03-09 ENCOUNTER — Encounter: Payer: Self-pay | Admitting: Student

## 2022-03-09 VITALS — BP 108/64 | HR 62 | Ht 66.0 in | Wt 218.8 lb

## 2022-03-09 DIAGNOSIS — G4733 Obstructive sleep apnea (adult) (pediatric): Secondary | ICD-10-CM | POA: Diagnosis not present

## 2022-03-09 DIAGNOSIS — I5022 Chronic systolic (congestive) heart failure: Secondary | ICD-10-CM | POA: Diagnosis not present

## 2022-03-09 DIAGNOSIS — I4819 Other persistent atrial fibrillation: Secondary | ICD-10-CM

## 2022-03-09 LAB — CUP PACEART INCLINIC DEVICE CHECK
Battery Remaining Longevity: 13 mo
Battery Voltage: 2.86 V
Brady Statistic AP VP Percent: 97.69 %
Brady Statistic AP VS Percent: 0.12 %
Brady Statistic AS VP Percent: 1.66 %
Brady Statistic AS VS Percent: 0.53 %
Brady Statistic RA Percent Paced: 95.28 %
Brady Statistic RV Percent Paced: 96.93 %
Date Time Interrogation Session: 20230609102809
HighPow Impedance: 78 Ohm
Implantable Lead Implant Date: 20160607
Implantable Lead Implant Date: 20160607
Implantable Lead Implant Date: 20160607
Implantable Lead Location: 753858
Implantable Lead Location: 753859
Implantable Lead Location: 753860
Implantable Lead Model: 4598
Implantable Lead Model: 5076
Implantable Pulse Generator Implant Date: 20160607
Lead Channel Impedance Value: 1026 Ohm
Lead Channel Impedance Value: 1102 Ohm
Lead Channel Impedance Value: 1216 Ohm
Lead Channel Impedance Value: 1273 Ohm
Lead Channel Impedance Value: 1368 Ohm
Lead Channel Impedance Value: 1501 Ohm
Lead Channel Impedance Value: 380 Ohm
Lead Channel Impedance Value: 456 Ohm
Lead Channel Impedance Value: 513 Ohm
Lead Channel Impedance Value: 589 Ohm
Lead Channel Impedance Value: 646 Ohm
Lead Channel Impedance Value: 836 Ohm
Lead Channel Impedance Value: 893 Ohm
Lead Channel Pacing Threshold Amplitude: 0.5 V
Lead Channel Pacing Threshold Amplitude: 0.5 V
Lead Channel Pacing Threshold Amplitude: 1.5 V
Lead Channel Pacing Threshold Pulse Width: 0.4 ms
Lead Channel Pacing Threshold Pulse Width: 0.4 ms
Lead Channel Pacing Threshold Pulse Width: 0.4 ms
Lead Channel Sensing Intrinsic Amplitude: 1.25 mV
Lead Channel Sensing Intrinsic Amplitude: 1.625 mV
Lead Channel Sensing Intrinsic Amplitude: 11.125 mV
Lead Channel Sensing Intrinsic Amplitude: 13.125 mV
Lead Channel Setting Pacing Amplitude: 1.5 V
Lead Channel Setting Pacing Amplitude: 2 V
Lead Channel Setting Pacing Amplitude: 2 V
Lead Channel Setting Pacing Pulse Width: 0.4 ms
Lead Channel Setting Pacing Pulse Width: 0.4 ms
Lead Channel Setting Sensing Sensitivity: 0.3 mV

## 2022-03-09 NOTE — Progress Notes (Signed)
Remote ICD transmission.   

## 2022-03-09 NOTE — Patient Instructions (Signed)

## 2022-03-15 ENCOUNTER — Inpatient Hospital Stay (HOSPITAL_COMMUNITY): Payer: Medicare PPO | Attending: Hematology

## 2022-03-15 DIAGNOSIS — R918 Other nonspecific abnormal finding of lung field: Secondary | ICD-10-CM | POA: Diagnosis not present

## 2022-03-15 DIAGNOSIS — C439 Malignant melanoma of skin, unspecified: Secondary | ICD-10-CM

## 2022-03-15 DIAGNOSIS — Z7901 Long term (current) use of anticoagulants: Secondary | ICD-10-CM | POA: Insufficient documentation

## 2022-03-15 DIAGNOSIS — Z87891 Personal history of nicotine dependence: Secondary | ICD-10-CM | POA: Diagnosis not present

## 2022-03-15 DIAGNOSIS — E211 Secondary hyperparathyroidism, not elsewhere classified: Secondary | ICD-10-CM | POA: Diagnosis not present

## 2022-03-15 DIAGNOSIS — D638 Anemia in other chronic diseases classified elsewhere: Secondary | ICD-10-CM | POA: Diagnosis not present

## 2022-03-15 DIAGNOSIS — Z79899 Other long term (current) drug therapy: Secondary | ICD-10-CM | POA: Diagnosis not present

## 2022-03-15 DIAGNOSIS — E538 Deficiency of other specified B group vitamins: Secondary | ICD-10-CM | POA: Insufficient documentation

## 2022-03-15 DIAGNOSIS — D696 Thrombocytopenia, unspecified: Secondary | ICD-10-CM | POA: Diagnosis not present

## 2022-03-15 DIAGNOSIS — I5042 Chronic combined systolic (congestive) and diastolic (congestive) heart failure: Secondary | ICD-10-CM | POA: Diagnosis not present

## 2022-03-15 DIAGNOSIS — D649 Anemia, unspecified: Secondary | ICD-10-CM | POA: Diagnosis not present

## 2022-03-15 DIAGNOSIS — I129 Hypertensive chronic kidney disease with stage 1 through stage 4 chronic kidney disease, or unspecified chronic kidney disease: Secondary | ICD-10-CM | POA: Diagnosis not present

## 2022-03-15 DIAGNOSIS — Z85820 Personal history of malignant melanoma of skin: Secondary | ICD-10-CM | POA: Insufficient documentation

## 2022-03-15 DIAGNOSIS — N1832 Chronic kidney disease, stage 3b: Secondary | ICD-10-CM | POA: Diagnosis not present

## 2022-03-15 DIAGNOSIS — I4891 Unspecified atrial fibrillation: Secondary | ICD-10-CM | POA: Insufficient documentation

## 2022-03-15 LAB — COMPREHENSIVE METABOLIC PANEL
ALT: 16 U/L (ref 0–44)
AST: 13 U/L — ABNORMAL LOW (ref 15–41)
Albumin: 3.7 g/dL (ref 3.5–5.0)
Alkaline Phosphatase: 58 U/L (ref 38–126)
Anion gap: 6 (ref 5–15)
BUN: 26 mg/dL — ABNORMAL HIGH (ref 8–23)
CO2: 29 mmol/L (ref 22–32)
Calcium: 8.8 mg/dL — ABNORMAL LOW (ref 8.9–10.3)
Chloride: 104 mmol/L (ref 98–111)
Creatinine, Ser: 2.01 mg/dL — ABNORMAL HIGH (ref 0.61–1.24)
GFR, Estimated: 33 mL/min — ABNORMAL LOW (ref >60)
Glucose, Bld: 114 mg/dL — ABNORMAL HIGH (ref 70–99)
Potassium: 4.5 mmol/L (ref 3.5–5.1)
Sodium: 139 mmol/L (ref 135–145)
Total Bilirubin: 0.4 mg/dL (ref 0.3–1.2)
Total Protein: 6.9 g/dL (ref 6.5–8.1)

## 2022-03-15 LAB — CBC WITH DIFFERENTIAL/PLATELET
Abs Immature Granulocytes: 0.01 10*3/uL (ref 0.00–0.07)
Basophils Absolute: 0 10*3/uL (ref 0.0–0.1)
Basophils Relative: 1 %
Eosinophils Absolute: 0.1 10*3/uL (ref 0.0–0.5)
Eosinophils Relative: 2 %
HCT: 36.2 % — ABNORMAL LOW (ref 39.0–52.0)
Hemoglobin: 11.8 g/dL — ABNORMAL LOW (ref 13.0–17.0)
Immature Granulocytes: 0 %
Lymphocytes Relative: 19 %
Lymphs Abs: 1.1 10*3/uL (ref 0.7–4.0)
MCH: 33.3 pg (ref 26.0–34.0)
MCHC: 32.6 g/dL (ref 30.0–36.0)
MCV: 102.3 fL — ABNORMAL HIGH (ref 80.0–100.0)
Monocytes Absolute: 0.5 10*3/uL (ref 0.1–1.0)
Monocytes Relative: 7 %
Neutro Abs: 4.3 10*3/uL (ref 1.7–7.7)
Neutrophils Relative %: 71 %
Platelets: 134 10*3/uL — ABNORMAL LOW (ref 150–400)
RBC: 3.54 MIL/uL — ABNORMAL LOW (ref 4.22–5.81)
RDW: 14.5 % (ref 11.5–15.5)
WBC: 6.1 10*3/uL (ref 4.0–10.5)
nRBC: 0 % (ref 0.0–0.2)

## 2022-03-15 LAB — VITAMIN B12: Vitamin B-12: 101 pg/mL — ABNORMAL LOW (ref 180–914)

## 2022-03-15 LAB — IRON AND TIBC
Iron: 62 ug/dL (ref 45–182)
Saturation Ratios: 23 % (ref 17.9–39.5)
TIBC: 270 ug/dL (ref 250–450)
UIBC: 208 ug/dL

## 2022-03-15 LAB — FOLATE: Folate: 5.9 ng/mL — ABNORMAL LOW (ref >5.9)

## 2022-03-15 LAB — FERRITIN: Ferritin: 247 ng/mL (ref 24–336)

## 2022-03-21 NOTE — Progress Notes (Unsigned)
[place], [place] [address]   CLINIC:  Medical Oncology/Hematology  PCP:  Ginger Organ., MD [place] [address] [phone]   REASON FOR VISIT:  Follow-up for normocytic anemia and thrombocytopenia  CURRENT THERAPY: Iron Gummies  INTERVAL HISTORY:  Chad Brown 81 y.o. male returns for routine follow-up of his normocytic anemia and thrombocytopenia.  He was last seen by Dr. Delton Coombes on 09/14/2021.  At today's visit, he reports feeling ***. He had radioablation of his thyroid in January 2023. He denies any other major health events or changes in his baseline health status since his last visit.  *** Iron?  B12?  Folate? *** No fatigue, fever, chills, shortness of breath, cough, chest pain, nausea, vomiting, abdominal pain.   *** Bleeding or bruising *** B symptoms  He has ***% energy and ***% appetite. He endorses that he is maintaining a stable weight.    REVIEW OF SYSTEMS:  Review of Systems - Oncology    PAST MEDICAL/SURGICAL HISTORY:  Past Medical History:  Diagnosis Date   Adenomatous colon polyp    tubular   AICD (automatic cardioverter/defibrillator) present 03/08/2015   MDT CRTD dual pacemaker and defib   Anemia    iron deficient   Arthritis    "about all my joints; hands, knees, back" (03/08/2015)   Atherosclerosis    Cataract    left eye small   Cholelithiasis    gallstones   Chronic systolic CHF (congestive heart failure) ([place])    a. New dx 12/2012 ? NICM, may be r/t afib. b. Nuc 03/2013 - normal;  c. 03/2015 TEE EF 15-20%.   Colon polyp, hyperplastic 01/2015   removed precancerous lesions   Depression    Diverticulosis    Dysrhythmia    afib   GERD (gastroesophageal reflux disease)    Glaucoma    right eye   Hyperlipidemia    Hypertension    Melanoma of eye ([place]) 2000's   "right; it's never been biopsied"   Melanoma of lower leg ([place]) 2015   "left; right at my knee"    Myocardial infarction ([place]) 1998   OSA (obstructive sleep apnea) 01/04/2016   no longer tolerates cpap   Peripheral vision loss, right 2006   Persistent atrial fibrillation ([place])    a. Dx 12/2012, s/p TEE/DCCV 01/26/13. b. On Xarelto (CHA2DS2VASc = 3);  c. 03/2015 TEE (EF 15-20%, no LAA thrombus) and DCCV - amio increased to 200 mg bid.   Pneumonia    Urinary hesitancy due to benign prostatic hypertrophy    Past Surgical History:  Procedure Laterality Date   ATRIAL FIBRILLATION ABLATION N/A 11/03/2019   Procedure: ATRIAL FIBRILLATION ABLATION;  Surgeon: Thompson Grayer, MD;  Location: [facility];  Service: Cardiovascular;  Laterality: N/A;   BACK SURGERY     upper back, cannot turn neck well   [place] N/A 01/26/2013   Procedure: CARDIOVERSION;  Surgeon: Lelon Perla, MD;  Location: [facility];  Service: Cardiovascular;  Laterality: N/A;   CARDIOVERSION N/A 03/23/2015   Procedure: CARDIOVERSION;  Surgeon: Jerline Pain, MD;  Location: [facility];  Service: Cardiovascular;  Laterality: N/A;   CARDIOVERSION N/A 08/14/2017   Procedure: CARDIOVERSION;  Surgeon: Josue Hector, MD;  Location: [facility];  Service: Cardiovascular;  Laterality: N/A;   CATARACT EXTRACTION Right ~ 2006   COLONOSCOPY WITH PROPOFOL N/A 02/10/2015   Procedure: COLONOSCOPY WITH PROPOFOL;  Surgeon:  Gatha Mayer, MD;  Location: [facility];  Service: Endoscopy;  Laterality: N/A;   COLONOSCOPY WITH PROPOFOL N/A 08/07/2016   Procedure: COLONOSCOPY WITH PROPOFOL;  Surgeon: Gatha Mayer, MD;  Location: [facility];  Service: Endoscopy;  Laterality: N/A;   ENTEROSCOPY N/A 08/17/2015   Procedure: ENTEROSCOPY;  Surgeon: Gatha Mayer, MD;  Location: [facility];  Service: Endoscopy;  Laterality: N/A;   EP IMPLANTABLE DEVICE N/A 03/08/2015   MDT Hillery Aldo CRT-D for nonischemic CM by Dr Rayann Heman for primary prevention   GLAUCOMA SURGERY Right ~ 2006   "put 3 stents in to drain  fluid" (03/08/2015) not successful, sent to [facility] to try to get last stent out   HOT HEMOSTASIS N/A 08/07/2016   Procedure: HOT HEMOSTASIS (ARGON PLASMA COAGULATION/BICAP);  Surgeon: Gatha Mayer, MD;  Location: [facility];  Service: Endoscopy;  Laterality: N/A;   INCISION AND DRAINAGE ABSCESS POSTERIOR CERVICALSPINE  05/2012   JOINT REPLACEMENT     MELANOMA EXCISION Left 2015   "lower leg; right at my knee"   REFRACTIVE SURGERY Right ~ 2006 X 2   "twice; both done at [facility]" (03/08/2015   SURGERY SCROTAL / TESTICULAR Right 1990's   TEE WITHOUT CARDIOVERSION N/A 01/26/2013   Procedure: TRANSESOPHAGEAL ECHOCARDIOGRAM (TEE);  Surgeon: Lelon Perla, MD;  Location: [facility];  Service: Cardiovascular;  Laterality: N/A;  [name] anes. /    TEE WITHOUT CARDIOVERSION N/A 10/05/2014   Procedure: TRANSESOPHAGEAL ECHOCARDIOGRAM (TEE)  with cardioversion;  Surgeon: Thayer Headings, MD;  Location: [facility];  Service: Cardiovascular;  Laterality: N/A;  12:52 synched cardioversion at 120 joules,...afib to SR...12 lead EKG ordered.Marland KitchenMarland KitchenCardiozem d/c'ed per MD verbal order at SR   TEE WITHOUT CARDIOVERSION N/A 03/23/2015   Procedure: TRANSESOPHAGEAL ECHOCARDIOGRAM (TEE);  Surgeon: Jerline Pain, MD;  Location: [facility];  Service: Cardiovascular;  Laterality: N/A;   TEE WITHOUT CARDIOVERSION N/A 11/02/2019   Procedure: TRANSESOPHAGEAL ECHOCARDIOGRAM (TEE);  Surgeon: Josue Hector, MD;  Location: [facility] ENDOSCOPY;  Service: Cardiovascular;  Laterality: N/A;   THORACIC SPINE SURGERY  03/2000   "ground calcium deposits from upper thoracic" (01/26/2013)   THYROIDECTOMY N/A 05/11/2021   Procedure: TOTAL THYROIDECTOMY;  Surgeon: Armandina Gemma, MD;  Location: [facility];  Service: General;  Laterality: N/A;   TOTAL HIP ARTHROPLASTY Right 06/2007     SOCIAL HISTORY:  Social History   Socioeconomic History   Marital status: Married    Spouse name: Not on file   Number of children: 3   Years of education: Not on file    Highest education level: Not on file  Occupational History   Occupation: retired  Tobacco Use   Smoking status: Former    Packs/day: 3.00    Years: 48.00    Total pack years: 144.00    Types: Cigarettes    Quit date: 09/28/2000    Years since quitting: 21.4   Smokeless tobacco: Never  Vaping Use   Vaping Use: Never used  Substance and Sexual Activity   Alcohol use: No   Drug use: No   Sexual activity: Not Currently  Other Topics Concern   Not on file  Social History Narrative   Pt lives in [place] with spouse.  3 children are grown and healthy.   Retired.  Ran a country store for 30 years, previously worked in the [employer] for 16 years   Social Determinants of Radio broadcast assistant Strain: Not on [employer] Insecurity: Not on file  Transportation Needs: No Transportation Needs (11/03/2020)   PRAPARE - Hydrologist (Medical): No    Lack of Transportation (Non-Medical): No  Physical Activity: Inactive (11/03/2020)   Exercise Vital Sign    Days of Exercise per Week: 0 days    Minutes of Exercise per Session: 0 min  Stress: Not on file  Social Connections: Not on file  Intimate Partner Violence: Not At Risk (11/03/2020)   Humiliation, Afraid, Rape, and Kick questionnaire    Fear of Current or Ex-Partner: No    Emotionally Abused: No    Physically Abused: No    Sexually Abused: No    FAMILY HISTORY:  Family History  Problem Relation Age of Onset   Kidney disease Mother    Heart disease Mother        MI, open heart   Diabetes Mother        dialysis   Leukemia Father    Colon cancer Paternal Uncle    Lung cancer Paternal Uncle        x 2   Prostate cancer Paternal Uncle    Diabetes Maternal Grandmother    Heart attack Maternal Uncle    Diabetes Maternal Aunt        x 3   Diabetes Maternal Uncle    Thyroid disease Sister     CURRENT MEDICATIONS:  Outpatient Encounter Medications as of 03/22/2022  Medication Sig    calcitRIOL (ROCALTROL) 0.25 MCG capsule Take 1 capsule by mouth 2 (two) times a week.   calcium carbonate (TUMS - DOSED IN MG ELEMENTAL CALCIUM) 500 MG chewable tablet Chew 1 tablet by mouth daily.   carvedilol (COREG) 12.5 MG tablet Take 1 tablet (12.5 mg total) by mouth 2 (two) times daily.   dorzolamide-timolol (COSOPT) 22.3-6.8 MG/ML ophthalmic solution Place 1 drop into the right eye 2 (two) times daily.   escitalopram (LEXAPRO) 20 MG tablet Take 1 tablet (20 mg total) by mouth daily.   ferrous sulfate 325 (65 FE) MG tablet Take 325 mg by mouth daily with breakfast.   finasteride (PROSCAR) 5 MG tablet Take 1 tablet (5 mg total) by mouth daily.   furosemide (LASIX) 40 MG tablet TAKE 1 TABLET EVERY MORNING & 1/2 TABLET EVERY EVENING   gabapentin (NEURONTIN) 100 MG capsule Take 200 mg by mouth 2 (two) times daily.    JARDIANCE 10 MG TABS tablet TAKE 1 TABLET DAILY BEFORE BREAKFAST   latanoprost (XALATAN) 0.005 % ophthalmic solution Place 1 drop into the right eye at bedtime.   levothyroxine (SYNTHROID) 125 MCG tablet Take 1 tablet (125 mcg total) by mouth daily.   nitroGLYCERIN (NITROSTAT) 0.4 MG SL tablet Place 0.4 mg under the tongue every 5 (five) minutes as needed for chest pain.   Polyethyl Glycol-Propyl Glycol 0.4-0.3 % SOLN Place 1 drop into the left eye in the morning and at bedtime.   REPATHA SURECLICK 536 MG/ML SOAJ Inject 1 pen into the skin every 14 (fourteen) days.   RHOPRESSA 0.02 % SOLN Place 1 drop into the right eye at bedtime.   Rivaroxaban (XARELTO) 15 MG TABS tablet Take 1 tablet (15 mg total) by mouth daily with supper.   sacubitril-valsartan (ENTRESTO) 24-26 MG Take 0.5 tablets by mouth 2 (two) times daily.   spironolactone (ALDACTONE) 25 MG tablet Take 25 mg by mouth daily. Pt takes 1/2 table 12.5 mg in the morning and 1/2 table 12.5 mg at night.   spironolactone (ALDACTONE) 25 MG tablet Take 1  tablet (25 mg total) by mouth at bedtime. PLEASE CALL TO SCHEDULE FOLLOW UP  APPOINTMENT.   tamsulosin (FLOMAX) 0.4 MG CAPS Take 0.4 mg by mouth at bedtime.    VITAMIN D PO Take 1 tablet by mouth daily.   No facility-administered encounter medications on file as of 03/22/2022.    ALLERGIES:  Allergies  Allergen Reactions   Pravastatin Sodium Other (See Comments)    Joint and muscle pain   Azithromycin Diarrhea     PHYSICAL EXAM:  ECOG PERFORMANCE STATUS: {CHL ONC ECOG PS:[id number]}  There were no vitals filed for this visit. There were no vitals filed for this visit. Physical Exam   LABORATORY DATA:  I have reviewed the labs as listed.  CBC    Component Value Date/Time   WBC 6.1 03/15/2022 1009   RBC 3.54 (L) 03/15/2022 1009   HGB 11.8 (L) 03/15/2022 1009   HCT 36.2 (L) 03/15/2022 1009   PLT 134 (L) 03/15/2022 1009   MCV 102.3 (H) 03/15/2022 1009   MCH 33.3 03/15/2022 1009   MCHC 32.6 03/15/2022 1009   RDW 14.5 03/15/2022 1009   LYMPHSABS 1.1 03/15/2022 1009   MONOABS 0.5 03/15/2022 1009   EOSABS 0.1 03/15/2022 1009   BASOSABS 0.0 03/15/2022 1009      Latest Ref Rng & Units 03/15/2022   10:09 AM 10/24/2021    3:35 PM 09/07/2021   12:53 PM  CMP  Glucose 70 - 99 mg/dL 114   84   BUN 8 - 23 mg/dL 26   28   Creatinine 0.61 - 1.24 mg/dL 2.01   1.90   Sodium 135 - 145 mmol/L 139   138   Potassium 3.5 - 5.1 mmol/L 4.5   4.2   Chloride 98 - 111 mmol/L 104   102   CO2 22 - 32 mmol/L 29   29   Calcium 8.9 - 10.3 mg/dL 8.8  9.4  8.8   Total Protein 6.5 - 8.1 g/dL 6.9   6.7   Total Bilirubin 0.3 - 1.2 mg/dL 0.4   0.2   Alkaline Phos 38 - 126 U/L 58   60   AST 15 - 41 U/L 13   15   ALT 0 - 44 U/L 16   20     DIAGNOSTIC IMAGING:  I have independently reviewed the relevant imaging and discussed with the patient.  ASSESSMENT & PLAN: 1.  Normocytic anemia: - Combination anemia from CKD and relative iron deficiency. - He takes a daily iron gummy. - Bleeding *** - Symptoms *** - Most recent labs (03/15/2022): Hgb 11.8/MCV 102.3.  Ferritin  247, iron saturation 23%. - Additional recent labs (03/15/2022) show vitamin B12 deficiency at 101.  Folate deficiency at 5.9. - PLAN: Continue daily iron supplement. - Vitamin B12 injection x1 given in clinic today.  ***  Patient instructed to start taking vitamin B12 500 mcg daily. - Start taking folic acid 314 mcg daily. - Repeat labs and RTC in 6 months  2.  Mild thrombocytopenia: - He has intermittent mild thrombocytopenia for several years duration. - Most recent platelet count (03/15/2022) mildly low at 134 - Additional recent labs (03/15/2022) show vitamin B12 deficiency at 101.  Folate deficiency at 5.9. - PLAN: Vitamin B12 injection x1 given in clinic today.  Patient instructed to start taking vitamin B12 500 mcg daily. - Start taking folic acid 970 mcg daily. - Repeat labs and RTC in 6 months  3.  Lung nodules: - He is  a former smoker, smoked more than 2 packs/day for many years and quit in 2001. - CT chest on 07/25/2020 showed numerous (greater than 10) new poorly marginated subsolid pulmonary nodules scattered throughout both lungs, largest 1.3 cm in the posterior right middle lobe.  - CT from 06/22/2021 showed pulmonary nodules are stable and some are regressed.  This indicates a benign etiology.  4.  T2 N0 melanoma of the left thigh: - Status post resection in 2015 [facility].  5.  Papillary thyroid carcinoma: - Thyroid surgery by Dr. Harlow Asa. - He had radioactive iodine pill in January 2023 under the direction of Dr. Loanne Drilling and Dr. Leonia Reeves  6.  CKD stage IIIb/IV - Creatinine on 03/15/2022 was 2.01, more or less at baseline - Follows with Dr. Theador Hawthorne.  7.  Other history - PMH: Paroxysmal atrial fibrillation, hypertension, hyperlipidemia, systolic CHF, sleep apnea, coronary artery disease   PLAN SUMMARY & DISPOSITION: ***  All questions were answered. The patient knows to call the clinic with any problems, questions or concerns.  Medical decision making: ***  Time spent  on visit: I spent {CHL ONC TIME VISIT - XKGYJ:[id number]} counseling the patient face to face. The total time spent in the appointment was {CHL ONC TIME VISIT - OVZCH:[id number]} and more than 50% was on counseling.   Chad Rush, PA-C  ***

## 2022-03-22 ENCOUNTER — Encounter (HOSPITAL_COMMUNITY): Payer: Self-pay | Admitting: Physician Assistant

## 2022-03-22 ENCOUNTER — Inpatient Hospital Stay (HOSPITAL_COMMUNITY): Payer: Medicare PPO

## 2022-03-22 ENCOUNTER — Inpatient Hospital Stay (HOSPITAL_COMMUNITY): Payer: Medicare PPO | Admitting: Physician Assistant

## 2022-03-22 VITALS — BP 106/69 | HR 71 | Temp 97.5°F | Resp 18 | Ht 66.0 in | Wt 215.6 lb

## 2022-03-22 DIAGNOSIS — Z85820 Personal history of malignant melanoma of skin: Secondary | ICD-10-CM | POA: Diagnosis not present

## 2022-03-22 DIAGNOSIS — D631 Anemia in chronic kidney disease: Secondary | ICD-10-CM

## 2022-03-22 DIAGNOSIS — I4891 Unspecified atrial fibrillation: Secondary | ICD-10-CM | POA: Diagnosis not present

## 2022-03-22 DIAGNOSIS — I5042 Chronic combined systolic (congestive) and diastolic (congestive) heart failure: Secondary | ICD-10-CM | POA: Diagnosis not present

## 2022-03-22 DIAGNOSIS — D696 Thrombocytopenia, unspecified: Secondary | ICD-10-CM | POA: Diagnosis not present

## 2022-03-22 DIAGNOSIS — N1832 Chronic kidney disease, stage 3b: Secondary | ICD-10-CM | POA: Diagnosis not present

## 2022-03-22 DIAGNOSIS — E538 Deficiency of other specified B group vitamins: Secondary | ICD-10-CM | POA: Diagnosis not present

## 2022-03-22 DIAGNOSIS — Z7901 Long term (current) use of anticoagulants: Secondary | ICD-10-CM | POA: Diagnosis not present

## 2022-03-22 DIAGNOSIS — Z79899 Other long term (current) drug therapy: Secondary | ICD-10-CM | POA: Diagnosis not present

## 2022-03-22 DIAGNOSIS — E211 Secondary hyperparathyroidism, not elsewhere classified: Secondary | ICD-10-CM | POA: Diagnosis not present

## 2022-03-22 DIAGNOSIS — D649 Anemia, unspecified: Secondary | ICD-10-CM | POA: Diagnosis not present

## 2022-03-22 DIAGNOSIS — R918 Other nonspecific abnormal finding of lung field: Secondary | ICD-10-CM | POA: Diagnosis not present

## 2022-03-22 DIAGNOSIS — I129 Hypertensive chronic kidney disease with stage 1 through stage 4 chronic kidney disease, or unspecified chronic kidney disease: Secondary | ICD-10-CM | POA: Diagnosis not present

## 2022-03-22 DIAGNOSIS — Z87891 Personal history of nicotine dependence: Secondary | ICD-10-CM | POA: Diagnosis not present

## 2022-03-22 DIAGNOSIS — D638 Anemia in other chronic diseases classified elsewhere: Secondary | ICD-10-CM | POA: Diagnosis not present

## 2022-03-22 MED ORDER — CYANOCOBALAMIN 1000 MCG/ML IJ SOLN
1000.0000 ug | Freq: Once | INTRAMUSCULAR | Status: AC
  Administered 2022-03-22: 1000 ug via INTRAMUSCULAR
  Filled 2022-03-22: qty 1

## 2022-03-22 NOTE — Patient Instructions (Signed)
[place] at [facility] Discharge Instructions  You were seen today by Tarri Abernethy PA-C for your anemia and low platelets.    Your mild anemia is stable with hemoglobin 11.8.  This is related to underlying chronic kidney disease.  We will continue to monitor your blood counts but you do not need treatment for your anemia at this time.  Your platelets are mildly low at 134, but you are not at risk of severe bleeding at this level.  You do not need any treatment for your low platelets at this time, but we will continue to monitor.  Continue taking iron gummy supplement daily.  START taking over-the-counter vitamin B12 500 mcg daily.  (We will give B12 injection in clinic today.)  START taking over-the-counter folic acid 384 mcg daily.  LABS: Return in 6 months for repeat labs  FOLLOW-UP APPOINTMENT: Office visit in 6 months, after labs   Thank you for choosing [place] at [facility] to provide your oncology and hematology care.  To afford each patient quality time with our provider, please arrive at least 15 minutes before your scheduled appointment time.   If you have a lab appointment with the [facility] please come in thru the Main Entrance and check in at the main information desk.  You need to re-schedule your appointment should you arrive 10 or more minutes late.  We strive to give you quality time with our providers, and arriving late affects you and other patients whose appointments are after yours.  Also, if you no show three or more times for appointments you may be dismissed from the clinic at the providers discretion.     Again, thank you for choosing [facility].  Our hope is that these requests will decrease the amount of time that you wait before being seen by our physicians.       _____________________________________________________________  Should you have questions after your visit to [facility], please contact our office at [phone] and follow the prompts.  Our office hours are 8:00 a.m. and 4:30 p.m. Monday - Friday.  Please note that voicemails left after 4:00 p.m. may not be returned until the following business day.  We are closed weekends and major holidays.  You do have access to a nurse 24-7, just call the main number to the clinic [phone] and do not press any options, hold on the line and a nurse will answer the phone.    For prescription refill requests, have your pharmacy contact our office and allow 72 hours.    Due to Covid, you will need to wear a mask upon entering the hospital. If you do not have a mask, a mask will be given to you at the Main Entrance upon arrival. For doctor visits, patients may have 1 support person age 40 or older with them. For treatment visits, patients can not have anyone with them due to social distancing guidelines and our immunocompromised population.

## 2022-04-05 DIAGNOSIS — L570 Actinic keratosis: Secondary | ICD-10-CM | POA: Diagnosis not present

## 2022-04-05 DIAGNOSIS — X32XXXD Exposure to sunlight, subsequent encounter: Secondary | ICD-10-CM | POA: Diagnosis not present

## 2022-04-05 DIAGNOSIS — Z08 Encounter for follow-up examination after completed treatment for malignant neoplasm: Secondary | ICD-10-CM | POA: Diagnosis not present

## 2022-04-05 DIAGNOSIS — Z85828 Personal history of other malignant neoplasm of skin: Secondary | ICD-10-CM | POA: Diagnosis not present

## 2022-04-05 DIAGNOSIS — C44619 Basal cell carcinoma of skin of left upper limb, including shoulder: Secondary | ICD-10-CM | POA: Diagnosis not present

## 2022-04-09 ENCOUNTER — Ambulatory Visit (INDEPENDENT_AMBULATORY_CARE_PROVIDER_SITE_OTHER): Payer: Medicare PPO

## 2022-04-09 DIAGNOSIS — I5022 Chronic systolic (congestive) heart failure: Secondary | ICD-10-CM

## 2022-04-09 DIAGNOSIS — Z95810 Presence of automatic (implantable) cardiac defibrillator: Secondary | ICD-10-CM | POA: Diagnosis not present

## 2022-04-11 NOTE — Progress Notes (Signed)
EPIC Encounter for ICM Monitoring  Patient Name: Chad Brown is a 81 y.o. male Date: 04/11/2022 Primary Care Physican: Ginger Organ., MD Primary Cardiologist: Nishan/McLean Electrophysiologist: Allred Bi-V Pacing: 96.5%   11/03/2021 Weight: 212-213 lbs  03/07/2022 Weight: 209-212 lbs    Time in AT/AF  0.0 hr/day (0.0%) (taking Xarelto)     Spoke with wife due to patient outside mowing.  She reports he is doing fine.     Optivol thoracic impedance suggesting normal fluid levels.   Prescribed:  Furosemide 40 mg 1 tablet (40 mg total) by mouth in the morning and 0.5 tablet (20 mg total)  every evening. Spironolactone 25 mg Take 1 tablet (25 mg total) by mouth daily. Take at bedtime   Labs: 03/15/2022 Creatine 2.01, BUN 26, Potassium 4.5, Sodium 139, GFR 33 A complete set of results can be found in Results Review.   Recommendations:   No changes and encouraged to call if experiencing any fluid symptoms.   Follow-up plan: ICM clinic phone appointment on 05/14/2022.  91 day device clinic remote transmission 05/31/2022.     EP/Cardiology Next Office Visit:   Recall 12/02/2022 with Dr Quentin Ore.  Recall 09/05/2022 with Oda Kilts, PA.  Recall 02/17/2022 with Dr Aundra Dubin.      Copy of ICM check sent to Dr. Rayann Heman.       3 month ICM trend: 04/09/2022.    12-14 Month ICM trend:     Rosalene Billings, RN 04/11/2022 3:10 PM

## 2022-04-12 ENCOUNTER — Other Ambulatory Visit: Payer: Self-pay | Admitting: Cardiology

## 2022-05-14 ENCOUNTER — Ambulatory Visit (INDEPENDENT_AMBULATORY_CARE_PROVIDER_SITE_OTHER): Payer: Medicare PPO

## 2022-05-14 DIAGNOSIS — I5022 Chronic systolic (congestive) heart failure: Secondary | ICD-10-CM

## 2022-05-14 DIAGNOSIS — Z95810 Presence of automatic (implantable) cardiac defibrillator: Secondary | ICD-10-CM | POA: Diagnosis not present

## 2022-05-16 NOTE — Progress Notes (Signed)
EPIC Encounter for ICM Monitoring  Patient Name: Chad Brown is a 81 y.o. male Date: 05/16/2022 Primary Care Physican: Ginger Organ., MD Primary Cardiologist: Nishan/McLean Electrophysiologist: Allred Bi-V Pacing: 96.5%   11/03/2021 Weight: 212-213 lbs  03/07/2022 Weight: 209-212 lbs  05/16/2022 Weight 209-210 lbs  Since 09-Apr-2022  AT/AF 12 Time in AT/AF <0.1 hr/day (0.3%) (taking Xarelto) Longest AT/AF 41 minutes      Spoke with patient and heart failure questions reviewed.  Pt asymptomatic for fluid accumulation.  Reports feeling well at this time and voices no complaints.     Optivol thoracic impedance suggesting normal fluid levels.   Prescribed:  Furosemide 40 mg 1 tablet (40 mg total) by mouth in the morning and 0.5 tablet (20 mg total)  every evening. Spironolactone 25 mg Take 1 tablet (25 mg total) by mouth daily. Take at bedtime   Labs: 03/15/2022 Creatine 2.01, BUN 26, Potassium 4.5, Sodium 139, GFR 33 A complete set of results can be found in Results Review.   Recommendations:   No changes and encouraged to call if experiencing any fluid symptoms.   Follow-up plan: ICM clinic phone appointment on 06/18/2022.  91 day device clinic remote transmission 05/31/2022.     EP/Cardiology Next Office Visit:   Recall 12/02/2022 with Dr Quentin Ore.  Recall 09/05/2022 with Oda Kilts, PA.  Recall 02/17/2022 with Dr Aundra Dubin.      Copy of ICM check sent to Dr. Rayann Heman.    3 month ICM trend: 05/14/2022.    12-14 Month ICM trend:     Rosalene Billings, RN 05/16/2022 2:52 PM

## 2022-05-31 ENCOUNTER — Ambulatory Visit (INDEPENDENT_AMBULATORY_CARE_PROVIDER_SITE_OTHER): Payer: Medicare PPO

## 2022-05-31 DIAGNOSIS — I428 Other cardiomyopathies: Secondary | ICD-10-CM

## 2022-06-04 LAB — CUP PACEART REMOTE DEVICE CHECK
Battery Remaining Longevity: 11 mo
Battery Voltage: 2.86 V
Brady Statistic AP VP Percent: 96.37 %
Brady Statistic AP VS Percent: 0.12 %
Brady Statistic AS VP Percent: 3.46 %
Brady Statistic AS VS Percent: 0.05 %
Brady Statistic RA Percent Paced: 92.05 %
Brady Statistic RV Percent Paced: 95.5 %
Date Time Interrogation Session: 20230831043726
HighPow Impedance: 78 Ohm
Implantable Lead Implant Date: 20160607
Implantable Lead Implant Date: 20160607
Implantable Lead Implant Date: 20160607
Implantable Lead Location: 753858
Implantable Lead Location: 753859
Implantable Lead Location: 753860
Implantable Lead Model: 4598
Implantable Lead Model: 5076
Implantable Pulse Generator Implant Date: 20160607
Lead Channel Impedance Value: 1026 Ohm
Lead Channel Impedance Value: 1216 Ohm
Lead Channel Impedance Value: 1235 Ohm
Lead Channel Impedance Value: 1368 Ohm
Lead Channel Impedance Value: 1501 Ohm
Lead Channel Impedance Value: 342 Ohm
Lead Channel Impedance Value: 456 Ohm
Lead Channel Impedance Value: 532 Ohm
Lead Channel Impedance Value: 627 Ohm
Lead Channel Impedance Value: 627 Ohm
Lead Channel Impedance Value: 836 Ohm
Lead Channel Impedance Value: 855 Ohm
Lead Channel Impedance Value: 969 Ohm
Lead Channel Pacing Threshold Amplitude: 0.5 V
Lead Channel Pacing Threshold Amplitude: 0.5 V
Lead Channel Pacing Threshold Amplitude: 1.625 V
Lead Channel Pacing Threshold Pulse Width: 0.4 ms
Lead Channel Pacing Threshold Pulse Width: 0.4 ms
Lead Channel Pacing Threshold Pulse Width: 0.4 ms
Lead Channel Sensing Intrinsic Amplitude: 1.625 mV
Lead Channel Sensing Intrinsic Amplitude: 1.625 mV
Lead Channel Sensing Intrinsic Amplitude: 11.625 mV
Lead Channel Sensing Intrinsic Amplitude: 11.625 mV
Lead Channel Setting Pacing Amplitude: 1.5 V
Lead Channel Setting Pacing Amplitude: 2 V
Lead Channel Setting Pacing Amplitude: 2.25 V
Lead Channel Setting Pacing Pulse Width: 0.4 ms
Lead Channel Setting Pacing Pulse Width: 0.4 ms
Lead Channel Setting Sensing Sensitivity: 0.3 mV

## 2022-06-11 ENCOUNTER — Ambulatory Visit (HOSPITAL_COMMUNITY)
Admission: RE | Admit: 2022-06-11 | Discharge: 2022-06-11 | Disposition: A | Discharge status: Home / Self Care | Payer: Medicare PPO | Source: Ambulatory Visit | Attending: Pulmonary Disease | Admitting: Pulmonary Disease

## 2022-06-11 DIAGNOSIS — N281 Cyst of kidney, acquired: Secondary | ICD-10-CM | POA: Insufficient documentation

## 2022-06-11 DIAGNOSIS — I700 Atherosclerosis of aorta: Secondary | ICD-10-CM | POA: Diagnosis not present

## 2022-06-11 DIAGNOSIS — R918 Other nonspecific abnormal finding of lung field: Secondary | ICD-10-CM | POA: Diagnosis not present

## 2022-06-11 DIAGNOSIS — J439 Emphysema, unspecified: Secondary | ICD-10-CM | POA: Diagnosis not present

## 2022-06-11 DIAGNOSIS — K8020 Calculus of gallbladder without cholecystitis without obstruction: Secondary | ICD-10-CM | POA: Insufficient documentation

## 2022-06-13 NOTE — Progress Notes (Signed)
Patty Sermons, please let patient know I reviewed his CT scan results.  His lung nodules are stable.  No additional follow-up is needed at this time.  He does however have gallstones present on his CT and should follow-up with primary care.  Thanks,  BLI  Garner Nash, DO [facility] 06/13/2022 12:01 PM

## 2022-06-18 ENCOUNTER — Ambulatory Visit (INDEPENDENT_AMBULATORY_CARE_PROVIDER_SITE_OTHER): Payer: Medicare PPO

## 2022-06-18 DIAGNOSIS — Z95810 Presence of automatic (implantable) cardiac defibrillator: Secondary | ICD-10-CM | POA: Diagnosis not present

## 2022-06-18 DIAGNOSIS — I5022 Chronic systolic (congestive) heart failure: Secondary | ICD-10-CM | POA: Diagnosis not present

## 2022-06-21 NOTE — Progress Notes (Signed)
Remote ICD transmission.   

## 2022-06-22 DIAGNOSIS — N1832 Chronic kidney disease, stage 3b: Secondary | ICD-10-CM | POA: Diagnosis not present

## 2022-06-22 DIAGNOSIS — D638 Anemia in other chronic diseases classified elsewhere: Secondary | ICD-10-CM | POA: Diagnosis not present

## 2022-06-22 DIAGNOSIS — E211 Secondary hyperparathyroidism, not elsewhere classified: Secondary | ICD-10-CM | POA: Diagnosis not present

## 2022-06-22 DIAGNOSIS — I129 Hypertensive chronic kidney disease with stage 1 through stage 4 chronic kidney disease, or unspecified chronic kidney disease: Secondary | ICD-10-CM | POA: Diagnosis not present

## 2022-06-22 DIAGNOSIS — I5042 Chronic combined systolic (congestive) and diastolic (congestive) heart failure: Secondary | ICD-10-CM | POA: Diagnosis not present

## 2022-06-22 NOTE — Progress Notes (Signed)
EPIC Encounter for ICM Monitoring  Patient Name: Chad Brown is a 81 y.o. male Date: 06/22/2022 Primary Care Physican: Ginger Organ., MD Primary Cardiologist: Nishan/McLean Electrophysiologist: [name] Bi-V Pacing: 94.1%   11/03/2021 Weight: 212-213 lbs  03/07/2022 Weight: 209-212 lbs  05/16/2022 Weight 209-210 lbs   Since 09-Apr-2022  AT/AF   12 Time in AT/AF  <0.1 hr/day (0.3%) (taking Xarelto) Longest AT/AF 41 minutes      Spoke with wife, per DPR and heart failure questions reviewed.  Pt is feeling well.   Reviewed transmission results.     Optivol thoracic impedance suggesting normal fluid levels.     Prescribed:  Furosemide 40 mg 1 tablet (40 mg total) by mouth in the morning and 0.5 tablet (20 mg total)  every evening. Spironolactone 25 mg Take 1 tablet (25 mg total) by mouth daily. Take at bedtime   Labs: 03/15/2022 Creatine 2.01, BUN 26, Potassium 4.5, Sodium 139, GFR 33 A complete set of results can be found in Results Review.   Recommendations:   No changes and encouraged to call if experiencing any fluid symptoms.   Follow-up plan: ICM clinic phone appointment on 07/23/2022.  91 day device clinic remote transmission 08/30/2022.     EP/Cardiology Next Office Visit:   Recall 12/02/2022 with Dr Curt Bears.   Recall 02/17/2022 with Dr Aundra Dubin.      Copy of ICM check sent to Dr. Curt Bears.    3 month ICM trend: 06/18/2022.    12-14 Month ICM trend:     Rosalene Billings, RN 06/22/2022 2:10 PM

## 2022-06-27 ENCOUNTER — Other Ambulatory Visit (HOSPITAL_COMMUNITY): Payer: Self-pay | Admitting: Nephrology

## 2022-06-27 DIAGNOSIS — E211 Secondary hyperparathyroidism, not elsewhere classified: Secondary | ICD-10-CM | POA: Diagnosis not present

## 2022-06-27 DIAGNOSIS — N281 Cyst of kidney, acquired: Secondary | ICD-10-CM

## 2022-06-27 DIAGNOSIS — N178 Other acute kidney failure: Secondary | ICD-10-CM

## 2022-06-27 DIAGNOSIS — N184 Chronic kidney disease, stage 4 (severe): Secondary | ICD-10-CM

## 2022-06-27 DIAGNOSIS — I5042 Chronic combined systolic (congestive) and diastolic (congestive) heart failure: Secondary | ICD-10-CM | POA: Diagnosis not present

## 2022-06-27 DIAGNOSIS — D638 Anemia in other chronic diseases classified elsewhere: Secondary | ICD-10-CM

## 2022-06-27 DIAGNOSIS — I129 Hypertensive chronic kidney disease with stage 1 through stage 4 chronic kidney disease, or unspecified chronic kidney disease: Secondary | ICD-10-CM

## 2022-07-04 ENCOUNTER — Ambulatory Visit: Payer: Medicare PPO | Admitting: Pulmonary Disease

## 2022-07-04 ENCOUNTER — Encounter: Payer: Self-pay | Admitting: Pulmonary Disease

## 2022-07-04 VITALS — BP 90/58 | HR 48 | Ht 66.0 in | Wt 216.2 lb

## 2022-07-04 DIAGNOSIS — C73 Malignant neoplasm of thyroid gland: Secondary | ICD-10-CM

## 2022-07-04 DIAGNOSIS — R918 Other nonspecific abnormal finding of lung field: Secondary | ICD-10-CM

## 2022-07-04 DIAGNOSIS — Z85820 Personal history of malignant melanoma of skin: Secondary | ICD-10-CM | POA: Diagnosis not present

## 2022-07-04 DIAGNOSIS — Z87891 Personal history of nicotine dependence: Secondary | ICD-10-CM

## 2022-07-04 NOTE — Progress Notes (Signed)
Synopsis: Referred in March 2022 for multiple lung nodules by Ginger Organ., MD  Subjective:   PATIENT ID: Chad Brown GENDER: male DOB: October 11, 1940, MRN: [id number]  Chief Complaint  Patient presents with   Follow-up    Ct chest      PMH Former smoker, 40+ years, quit in 2001, CT scan with multiple pulmonary nodules evaluated by PET scan.  Patient has a past medical history of stage II melanoma of the lower leg status post resection in 0947, chronic systolic heart failure, AICD placement, history of hypertension hyperlipidemia.  Patient initially seen by Dr. Chase Caller for evaluation of multiple pulmonary nodules.  There was initial mentioning of potential metastatic disease with history of melanoma.  Patient underwent nuclear medicine pet imaging which revealed low-level uptake within the upper lobe nodules.  She also has a calcified lesion within the pancreas and a new thyroid nodule within the right thyroid gland was found that did have PET uptake.  From a respiratory standpoint the patient is breathing okay.  He had pulmonary function tests completed prior to today's office visit which revealed normal ratio, a normal FEV1, a TLC of 86%, DLCO of 69%.  There was no significant bronchodilator response.  Patient's PFTs were reviewed and interpreted today in the office with patient.  OV 07/10/2021.  Here today for follow-up after recent CT scan of the chest.  Nodules that we have been following are now stable some have completely regressed altogether.  He still has a small right upper lobe nodule.  Otherwise from respiratory standpoint patient has been doing well.  We reviewed all of this today in the office.  OV 07/04/2022: Here today for follow-up after recent CT scan.  Patient was seen a year ago.He had his repeat CT scan of the chest on 06/11/2022.  Patient has stable bilateral pulmonary nodules of 4 mm with resolution of the previously seen groundglass opacity within the anterior  portion of the right upper lobe.  Patient has no respiratory complaints today.  Recent nuclear medicine thyroid scan clear no definite evidence of metastatic disease.  He did well with his thyroid surgery.  Was diagnosed with papillary thyroid cancer      Past Medical History:  Diagnosis Date   Adenomatous colon polyp    tubular   AICD (automatic cardioverter/defibrillator) present 03/08/2015   MDT CRTD dual pacemaker and defib   Anemia    iron deficient   Arthritis    "about all my joints; hands, knees, back" (03/08/2015)   Atherosclerosis    Cataract    left eye small   Cholelithiasis    gallstones   Chronic systolic CHF (congestive heart failure) ([place])    a. New dx 12/2012 ? NICM, may be r/t afib. b. Nuc 03/2013 - normal;  c. 03/2015 TEE EF 15-20%.   Colon polyp, hyperplastic 01/2015   removed precancerous lesions   Depression    Diverticulosis    Dysrhythmia    afib   GERD (gastroesophageal reflux disease)    Glaucoma    right eye   Hyperlipidemia    Hypertension    Melanoma of eye ([place]) 2000's   "right; it's never been biopsied"   Melanoma of lower leg ([place]) 2015   "left; right at my knee"   Myocardial infarction ([place]) 1998   OSA (obstructive sleep apnea) 01/04/2016   no longer tolerates cpap   Peripheral vision loss, right 2006   Persistent atrial fibrillation ([place])    a. Dx  12/2012, s/p TEE/DCCV 01/26/13. b. On Xarelto (CHA2DS2VASc = 3);  c. 03/2015 TEE (EF 15-20%, no LAA thrombus) and DCCV - amio increased to 200 mg bid.   Pneumonia    Urinary hesitancy due to benign prostatic hypertrophy      Family History  Problem Relation Age of Onset   Kidney disease Mother    Heart disease Mother        MI, open heart   Diabetes Mother        dialysis   Leukemia Father    Colon cancer Paternal Uncle    Lung cancer Paternal Uncle        x 2   Prostate cancer Paternal Uncle    Diabetes Maternal Grandmother    Heart attack Maternal Uncle    Diabetes Maternal Aunt         x 3   Diabetes Maternal Uncle    Thyroid disease Sister      Past Surgical History:  Procedure Laterality Date   ATRIAL FIBRILLATION ABLATION N/A 11/03/2019   Procedure: ATRIAL FIBRILLATION ABLATION;  Surgeon: Thompson Grayer, MD;  Location: [facility];  Service: Cardiovascular;  Laterality: N/A;   BACK SURGERY     upper back, cannot turn neck well   [place] N/A 01/26/2013   Procedure: CARDIOVERSION;  Surgeon: Lelon Perla, MD;  Location: [facility];  Service: Cardiovascular;  Laterality: N/A;   CARDIOVERSION N/A 03/23/2015   Procedure: CARDIOVERSION;  Surgeon: Jerline Pain, MD;  Location: [facility];  Service: Cardiovascular;  Laterality: N/A;   CARDIOVERSION N/A 08/14/2017   Procedure: CARDIOVERSION;  Surgeon: Josue Hector, MD;  Location: [facility];  Service: Cardiovascular;  Laterality: N/A;   CATARACT EXTRACTION Right ~ 2006   COLONOSCOPY WITH PROPOFOL N/A 02/10/2015   Procedure: COLONOSCOPY WITH PROPOFOL;  Surgeon: Gatha Mayer, MD;  Location: [facility];  Service: Endoscopy;  Laterality: N/A;   COLONOSCOPY WITH PROPOFOL N/A 08/07/2016   Procedure: COLONOSCOPY WITH PROPOFOL;  Surgeon: Gatha Mayer, MD;  Location: [facility];  Service: Endoscopy;  Laterality: N/A;   ENTEROSCOPY N/A 08/17/2015   Procedure: ENTEROSCOPY;  Surgeon: Gatha Mayer, MD;  Location: [facility];  Service: Endoscopy;  Laterality: N/A;   EP IMPLANTABLE DEVICE N/A 03/08/2015   MDT Hillery Aldo CRT-D for nonischemic CM by Dr Rayann Heman for primary prevention   GLAUCOMA SURGERY Right ~ 2006   "put 3 stents in to drain fluid" (03/08/2015) not successful, sent to [facility] to try to get last stent out   HOT HEMOSTASIS N/A 08/07/2016   Procedure: HOT HEMOSTASIS (ARGON PLASMA COAGULATION/BICAP);  Surgeon: Gatha Mayer, MD;  Location: [facility];  Service: Endoscopy;  Laterality: N/A;   INCISION AND DRAINAGE ABSCESS POSTERIOR CERVICALSPINE  05/2012   JOINT  REPLACEMENT     MELANOMA EXCISION Left 2015   "lower leg; right at my knee"   REFRACTIVE SURGERY Right ~ 2006 X 2   "twice; both done at [facility]" (03/08/2015   SURGERY SCROTAL / TESTICULAR Right 1990's   TEE WITHOUT CARDIOVERSION N/A 01/26/2013   Procedure: TRANSESOPHAGEAL ECHOCARDIOGRAM (TEE);  Surgeon: Lelon Perla, MD;  Location: [facility];  Service: Cardiovascular;  Laterality: N/A;  [name] anes. /    TEE WITHOUT CARDIOVERSION N/A 10/05/2014   Procedure: TRANSESOPHAGEAL ECHOCARDIOGRAM (TEE)  with cardioversion;  Surgeon: Thayer Headings, MD;  Location: [facility];  Service: Cardiovascular;  Laterality: N/A;  12:52 synched cardioversion at 120 joules,...afib to SR...12 lead  EKG ordered.Marland KitchenMarland KitchenCardiozem d/c'ed per MD verbal order at SR   TEE WITHOUT CARDIOVERSION N/A 03/23/2015   Procedure: TRANSESOPHAGEAL ECHOCARDIOGRAM (TEE);  Surgeon: Jerline Pain, MD;  Location: [facility];  Service: Cardiovascular;  Laterality: N/A;   TEE WITHOUT CARDIOVERSION N/A 11/02/2019   Procedure: TRANSESOPHAGEAL ECHOCARDIOGRAM (TEE);  Surgeon: Josue Hector, MD;  Location: [facility] ENDOSCOPY;  Service: Cardiovascular;  Laterality: N/A;   THORACIC SPINE SURGERY  03/2000   "ground calcium deposits from upper thoracic" (01/26/2013)   THYROIDECTOMY N/A 05/11/2021   Procedure: TOTAL THYROIDECTOMY;  Surgeon: Armandina Gemma, MD;  Location: [facility];  Service: General;  Laterality: N/A;   TOTAL HIP ARTHROPLASTY Right 06/2007    Social History   Socioeconomic History   Marital status: Married    Spouse name: Not on file   Number of children: 3   Years of education: Not on file   Highest education level: Not on file  Occupational History   Occupation: retired  Tobacco Use   Smoking status: Former    Packs/day: 3.00    Years: 48.00    Total pack years: 144.00    Types: Cigarettes    Quit date: 09/28/2000    Years since quitting: 21.7   Smokeless tobacco: Never  Vaping Use   Vaping Use: Never used  Substance and Sexual  Activity   Alcohol use: No   Drug use: No   Sexual activity: Not Currently  Other Topics Concern   Not on file  Social History Narrative   Pt lives in [place] with spouse.  3 children are grown and healthy.   Retired.  Ran a country store for 30 years, previously worked in the [employer] for 16 years   Social Determinants of Radio broadcast assistant Strain: Not on [employer] Insecurity: Not on file  Transportation Needs: No Transportation Needs (11/03/2020)   PRAPARE - Hydrologist (Medical): No    Lack of Transportation (Non-Medical): No  Physical Activity: Inactive (11/03/2020)   Exercise Vital Sign    Days of Exercise per Week: 0 days    Minutes of Exercise per Session: 0 min  Stress: Not on file  Social Connections: Not on file  Intimate Partner Violence: Not At Risk (11/03/2020)   Humiliation, Afraid, Rape, and Kick questionnaire    Fear of Current or Ex-Partner: No    Emotionally Abused: No    Physically Abused: No    Sexually Abused: No     Allergies  Allergen Reactions   Pravastatin Sodium Other (See Comments)    Joint and muscle pain   Azithromycin Diarrhea     Outpatient Medications Prior to Visit  Medication Sig Dispense Refill   calcitRIOL (ROCALTROL) 0.25 MCG capsule Take 1 capsule by mouth once a week.     calcium carbonate (TUMS - DOSED IN MG ELEMENTAL CALCIUM) 500 MG chewable tablet Chew 1 tablet by mouth daily.     carvedilol (COREG) 12.5 MG tablet Take 1 tablet (12.5 mg total) by mouth 2 (two) times daily. 180 tablet 3   dorzolamide-timolol (COSOPT) 22.3-6.8 MG/ML ophthalmic solution Place 1 drop into the right eye 2 (two) times daily.     escitalopram (LEXAPRO) 20 MG tablet Take 1 tablet (20 mg total) by mouth daily. 90 tablet 0   ferrous sulfate 325 (65 FE) MG tablet Take 325 mg by mouth daily with breakfast.     finasteride (PROSCAR) 5 MG tablet Take 1 tablet (5 mg total)  by mouth daily. 30 tablet 2    furosemide (LASIX) 40 MG tablet TAKE 1 TABLET EVERY MORNING & 1/2 TABLET EVERY EVENING 135 tablet 3   gabapentin (NEURONTIN) 100 MG capsule Take 200 mg by mouth 2 (two) times daily.      JARDIANCE 10 MG TABS tablet TAKE 1 TABLET DAILY BEFORE BREAKFAST 90 tablet 3   latanoprost (XALATAN) 0.005 % ophthalmic solution Place 1 drop into the right eye at bedtime.     levothyroxine (SYNTHROID) 125 MCG tablet Take 1 tablet (125 mcg total) by mouth daily. 90 tablet 3   nitroGLYCERIN (NITROSTAT) 0.4 MG SL tablet Place 0.4 mg under the tongue every 5 (five) minutes as needed for chest pain.     Polyethyl Glycol-Propyl Glycol 0.4-0.3 % SOLN Place 1 drop into the left eye in the morning and at bedtime.     REPATHA SURECLICK 517 MG/ML SOAJ Inject 1 pen into the skin every 14 (fourteen) days. 2 mL 11   RHOPRESSA 0.02 % SOLN Place 1 drop into the right eye at bedtime.     Rivaroxaban (XARELTO) 15 MG TABS tablet Take 1 tablet (15 mg total) by mouth daily with supper. 90 tablet 1   sacubitril-valsartan (ENTRESTO) 24-26 MG Take 0.5 tablets by mouth 2 (two) times daily. 60 tablet 11   spironolactone (ALDACTONE) 25 MG tablet Take 25 mg by mouth daily. Pt takes 1/2 table 12.5 mg in the morning and 1/2 table 12.5 mg at night.     tamsulosin (FLOMAX) 0.4 MG CAPS Take 0.4 mg by mouth at bedtime.      VITAMIN D PO Take 1 tablet by mouth daily.     No facility-administered medications prior to visit.    Review of Systems  Constitutional:  Negative for chills, fever, malaise/fatigue and weight loss.  HENT:  Negative for hearing loss, sore throat and tinnitus.   Eyes:  Negative for blurred vision and double vision.  Respiratory:  Negative for cough, hemoptysis, sputum production, shortness of breath, wheezing and stridor.   Cardiovascular:  Negative for chest pain, palpitations, orthopnea, leg swelling and PND.  Gastrointestinal:  Negative for abdominal pain, constipation, diarrhea, heartburn, nausea and vomiting.   Genitourinary:  Negative for dysuria, hematuria and urgency.  Musculoskeletal:  Negative for joint pain and myalgias.  Skin:  Negative for itching and rash.  Neurological:  Negative for dizziness, tingling, weakness and headaches.  Endo/Heme/Allergies:  Negative for environmental allergies. Does not bruise/bleed easily.  Psychiatric/Behavioral:  Negative for depression. The patient is not nervous/anxious and does not have insomnia.   All other systems reviewed and are negative.    Objective:  Physical Exam Vitals reviewed.  Constitutional:      General: He is not in acute distress.    Appearance: He is well-developed.  HENT:     Head: Normocephalic and atraumatic.  Eyes:     General: No scleral icterus.    Conjunctiva/sclera: Conjunctivae normal.     Pupils: Pupils are equal, round, and reactive to light.  Neck:     Vascular: No JVD.     Trachea: No tracheal deviation.  Cardiovascular:     Rate and Rhythm: Normal rate and regular rhythm.     Heart sounds: Normal heart sounds. No murmur heard. Pulmonary:     Effort: Pulmonary effort is normal. No tachypnea, accessory muscle usage or respiratory distress.     Breath sounds: No stridor. No wheezing, rhonchi or rales.  Abdominal:     General: There is  no distension.     Palpations: Abdomen is soft.     Tenderness: There is no abdominal tenderness.  Musculoskeletal:        General: No tenderness.     Cervical back: Neck supple.  Lymphadenopathy:     Cervical: No cervical adenopathy.  Skin:    General: Skin is warm and dry.     Capillary Refill: Capillary refill takes less than 2 seconds.     Findings: No rash.  Neurological:     Mental Status: He is alert and oriented to person, place, and time.  Psychiatric:        Behavior: Behavior normal.      Vitals:   07/04/22 0956  BP: (!) 90/58  Pulse: (!) 48  SpO2: 97%  Weight: 216 lb 3.2 oz (98.1 kg)  Height: '5\' 6"'$  (1.676 m)    97% on RA BMI Readings from Last 3  Encounters:  07/04/22 34.90 kg/m  03/22/22 34.80 kg/m  03/09/22 35.32 kg/m   Wt Readings from Last 3 Encounters:  07/04/22 216 lb 3.2 oz (98.1 kg)  03/22/22 215 lb 9.8 oz (97.8 kg)  03/09/22 218 lb 12.8 oz (99.2 kg)     CBC    Component Value Date/Time   WBC 6.1 03/15/2022 1009   RBC 3.54 (L) 03/15/2022 1009   HGB 11.8 (L) 03/15/2022 1009   HCT 36.2 (L) 03/15/2022 1009   PLT 134 (L) 03/15/2022 1009   MCV 102.3 (H) 03/15/2022 1009   MCH 33.3 03/15/2022 1009   MCHC 32.6 03/15/2022 1009   RDW 14.5 03/15/2022 1009   LYMPHSABS 1.1 03/15/2022 1009   MONOABS 0.5 03/15/2022 1009   EOSABS 0.1 03/15/2022 1009   BASOSABS 0.0 03/15/2022 1009    Chest Imaging: 12/07/2020 nuclear medicine PET scan: Patient has ill-defined right upper lobe nodular opacities largest at 1.2 cm, SUV max 1.8 similar to imaging which it measured 1.4 cm in January 2022.  Has a area of soft tissue thickening with an SUV max of 3.3.  These have not overtly grown in size in comparison to previous CT imaging in January.  No other concerning evidence of metastasis also has a right-sided thyroid nodule. The patient's images have been independently reviewed by me.    07/10/2021: CT scan of the chest: Here today for follow-up for pulmonary nodules.  Still has a few scattered small subcentimeter areas of groundglass.  Previous lung nodules that we were following some have regressed completely.  Still has a small nodule in the right upper lobe associated centrilobular and paraseptal emphysema consistent with his COPD. The patient's images have been independently reviewed by me.    September 2023 CT chest: Stable pulmonary nodules. The patient's images have been independently reviewed by me.    Pulmonary Functions Testing Results:    Latest Ref Rng & Units 12/28/2020   10:55 AM 11/18/2018    9:54 AM  PFT Results  FVC-Pre L 2.58  2.62   FVC-Predicted Pre % 80  78   FVC-Post L 2.78  2.89   FVC-Predicted Post % 86   86   Pre FEV1/FVC % % 74  71   Post FEV1/FCV % % 76  76   FEV1-Pre L 1.92  1.86   FEV1-Predicted Pre % 85  78   FEV1-Post L 2.11  2.19   DLCO uncorrected ml/min/mmHg 14.47  16.65   DLCO UNC% % 69  78   DLCO corrected ml/min/mmHg 14.47  18.73   DLCO COR %Predicted %  69  88   DLVA Predicted % 77  96   TLC L 5.21  6.02   TLC % Predicted % 86  99   RV % Predicted % 110  139     FeNO:   Pathology:   Echocardiogram:   Heart Catheterization:     Assessment & Plan:     ICD-10-CM   1. Lung nodules  R91.8 CT Chest Wo Contrast    2. Former smoker  Z87.891     3. History of melanoma  Z85.820     4. Stopped smoking with greater than 40 pack year history  Z87.891     5. Papillary thyroid carcinoma ([place])  C73       Discussion:  This is an 81 year old gentleman, former smoker, multiple pulmonary nodules.  He also recently diagnosed with papillary thyroid cancer status post thyroidectomy.  Recent nuclear medicine thyroid scan with no evidence of metastatic disease.  Plan: Repeat noncontrasted CT scan of the chest in 1 year. Follow-up with APP, Eric Form, NP after CT chest complete. Patient is agreeable to this plan.   Current Outpatient Medications:    calcitRIOL (ROCALTROL) 0.25 MCG capsule, Take 1 capsule by mouth once a week., Disp: , Rfl:    calcium carbonate (TUMS - DOSED IN MG ELEMENTAL CALCIUM) 500 MG chewable tablet, Chew 1 tablet by mouth daily., Disp: , Rfl:    carvedilol (COREG) 12.5 MG tablet, Take 1 tablet (12.5 mg total) by mouth 2 (two) times daily., Disp: 180 tablet, Rfl: 3   dorzolamide-timolol (COSOPT) 22.3-6.8 MG/ML ophthalmic solution, Place 1 drop into the right eye 2 (two) times daily., Disp: , Rfl:    escitalopram (LEXAPRO) 20 MG tablet, Take 1 tablet (20 mg total) by mouth daily., Disp: 90 tablet, Rfl: 0   ferrous sulfate 325 (65 FE) MG tablet, Take 325 mg by mouth daily with breakfast., Disp: , Rfl:    finasteride (PROSCAR) 5 MG tablet, Take 1 tablet  (5 mg total) by mouth daily., Disp: 30 tablet, Rfl: 2   furosemide (LASIX) 40 MG tablet, TAKE 1 TABLET EVERY MORNING & 1/2 TABLET EVERY EVENING, Disp: 135 tablet, Rfl: 3   gabapentin (NEURONTIN) 100 MG capsule, Take 200 mg by mouth 2 (two) times daily. , Disp: , Rfl:    JARDIANCE 10 MG TABS tablet, TAKE 1 TABLET DAILY BEFORE BREAKFAST, Disp: 90 tablet, Rfl: 3   latanoprost (XALATAN) 0.005 % ophthalmic solution, Place 1 drop into the right eye at bedtime., Disp: , Rfl:    levothyroxine (SYNTHROID) 125 MCG tablet, Take 1 tablet (125 mcg total) by mouth daily., Disp: 90 tablet, Rfl: 3   nitroGLYCERIN (NITROSTAT) 0.4 MG SL tablet, Place 0.4 mg under the tongue every 5 (five) minutes as needed for chest pain., Disp: , Rfl:    Polyethyl Glycol-Propyl Glycol 0.4-0.3 % SOLN, Place 1 drop into the left eye in the morning and at bedtime., Disp: , Rfl:    REPATHA SURECLICK 009 MG/ML SOAJ, Inject 1 pen into the skin every 14 (fourteen) days., Disp: 2 mL, Rfl: 11   RHOPRESSA 0.02 % SOLN, Place 1 drop into the right eye at bedtime., Disp: , Rfl:    Rivaroxaban (XARELTO) 15 MG TABS tablet, Take 1 tablet (15 mg total) by mouth daily with supper., Disp: 90 tablet, Rfl: 1   sacubitril-valsartan (ENTRESTO) 24-26 MG, Take 0.5 tablets by mouth 2 (two) times daily., Disp: 60 tablet, Rfl: 11   spironolactone (ALDACTONE) 25 MG tablet, Take 25 mg  by mouth daily. Pt takes 1/2 table 12.5 mg in the morning and 1/2 table 12.5 mg at night., Disp: , Rfl:    tamsulosin (FLOMAX) 0.4 MG CAPS, Take 0.4 mg by mouth at bedtime. , Disp: , Rfl:    VITAMIN D PO, Take 1 tablet by mouth daily., Disp: , Rfl:    Garner Nash, DO [facility] 07/04/2022 10:19 AM

## 2022-07-04 NOTE — Patient Instructions (Signed)
Thank you for visiting Dr. Valeta Harms at [facility]. Today we recommend the following:  Orders Placed This Encounter  Procedures   CT Chest Wo Contrast   Follow up after CT Chest complete in 1 year   Return in about 1 year (around 07/05/2023) for with Eric Form, NP, or Dr. Valeta Harms.    Please do your part to reduce the spread of COVID-19.

## 2022-07-05 ENCOUNTER — Ambulatory Visit (HOSPITAL_COMMUNITY)
Admission: RE | Admit: 2022-07-05 | Discharge: 2022-07-05 | Disposition: A | Discharge status: Home / Self Care | Payer: Medicare PPO | Source: Ambulatory Visit | Attending: Nephrology | Admitting: Nephrology

## 2022-07-05 DIAGNOSIS — N178 Other acute kidney failure: Secondary | ICD-10-CM | POA: Diagnosis not present

## 2022-07-05 DIAGNOSIS — N281 Cyst of kidney, acquired: Secondary | ICD-10-CM | POA: Insufficient documentation

## 2022-07-05 DIAGNOSIS — D638 Anemia in other chronic diseases classified elsewhere: Secondary | ICD-10-CM | POA: Insufficient documentation

## 2022-07-05 DIAGNOSIS — N184 Chronic kidney disease, stage 4 (severe): Secondary | ICD-10-CM | POA: Diagnosis not present

## 2022-07-05 DIAGNOSIS — I129 Hypertensive chronic kidney disease with stage 1 through stage 4 chronic kidney disease, or unspecified chronic kidney disease: Secondary | ICD-10-CM | POA: Insufficient documentation

## 2022-07-05 DIAGNOSIS — N189 Chronic kidney disease, unspecified: Secondary | ICD-10-CM | POA: Diagnosis not present

## 2022-07-23 ENCOUNTER — Ambulatory Visit (INDEPENDENT_AMBULATORY_CARE_PROVIDER_SITE_OTHER): Payer: Medicare PPO

## 2022-07-23 DIAGNOSIS — I5022 Chronic systolic (congestive) heart failure: Secondary | ICD-10-CM

## 2022-07-23 DIAGNOSIS — Z95810 Presence of automatic (implantable) cardiac defibrillator: Secondary | ICD-10-CM | POA: Diagnosis not present

## 2022-07-24 NOTE — Progress Notes (Signed)
EPIC Encounter for ICM Monitoring  Patient Name: Chad Brown is a 81 y.o. male Date: 07/24/2022 Primary Care Physican: Ginger Organ., MD Primary Cardiologist: Nishan/McLean Electrophysiologist: Cyndi Bender Pacing: 92%   07/24/2022 Weight 210 lbs   Clinical Status Since 18-Jun-2022 Time in AT/AF  <0.1 hr/day (0.3%) (taking Xarelto)      Spoke with patient and heart failure questions reviewed.  Transmission results reviewed.  Pt asymptomatic for fluid accumulation.  Reports feeling well at this time and voices no complaints.     Optivol thoracic impedance suggesting normal fluid levels.     Prescribed:  Furosemide 40 mg 1 tablet (40 mg total) by mouth in the morning and 0.5 tablet (20 mg total)  every evening. Spironolactone 25 mg Take 1 tablet (25 mg total) by mouth daily. Take at bedtime   Labs: 03/15/2022 Creatine 2.01, BUN 26, Potassium 4.5, Sodium 139, GFR 33 A complete set of results can be found in Results Review.   Recommendations:   No changes and encouraged to call if experiencing any fluid symptoms.   Follow-up plan: ICM clinic phone appointment on 08/27/2022.  91 day device clinic remote transmission 08/30/2022.     EP/Cardiology Next Office Visit:   Recall 12/02/2022 with Dr Curt Bears.   Recall 02/17/2022 with Dr Aundra Dubin.      Copy of ICM check sent to Dr. Curt Bears.    3 month ICM trend: 07/23/2022.    12-14 Month ICM trend:     Rosalene Billings, RN 07/24/2022 6:11 AM

## 2022-07-30 DIAGNOSIS — N184 Chronic kidney disease, stage 4 (severe): Secondary | ICD-10-CM | POA: Diagnosis not present

## 2022-07-30 DIAGNOSIS — I129 Hypertensive chronic kidney disease with stage 1 through stage 4 chronic kidney disease, or unspecified chronic kidney disease: Secondary | ICD-10-CM | POA: Diagnosis not present

## 2022-07-30 DIAGNOSIS — D638 Anemia in other chronic diseases classified elsewhere: Secondary | ICD-10-CM | POA: Diagnosis not present

## 2022-07-30 DIAGNOSIS — E211 Secondary hyperparathyroidism, not elsewhere classified: Secondary | ICD-10-CM | POA: Diagnosis not present

## 2022-07-30 DIAGNOSIS — I5042 Chronic combined systolic (congestive) and diastolic (congestive) heart failure: Secondary | ICD-10-CM | POA: Diagnosis not present

## 2022-07-30 DIAGNOSIS — N281 Cyst of kidney, acquired: Secondary | ICD-10-CM | POA: Diagnosis not present

## 2022-07-30 DIAGNOSIS — N178 Other acute kidney failure: Secondary | ICD-10-CM | POA: Diagnosis not present

## 2022-08-01 DIAGNOSIS — H40002 Preglaucoma, unspecified, left eye: Secondary | ICD-10-CM | POA: Diagnosis not present

## 2022-08-01 DIAGNOSIS — H21532 Iridodialysis, left eye: Secondary | ICD-10-CM | POA: Diagnosis not present

## 2022-08-01 DIAGNOSIS — H219 Unspecified disorder of iris and ciliary body: Secondary | ICD-10-CM | POA: Diagnosis not present

## 2022-08-01 DIAGNOSIS — Z961 Presence of intraocular lens: Secondary | ICD-10-CM | POA: Diagnosis not present

## 2022-08-02 ENCOUNTER — Other Ambulatory Visit: Payer: Self-pay

## 2022-08-02 DIAGNOSIS — C73 Malignant neoplasm of thyroid gland: Secondary | ICD-10-CM

## 2022-08-02 DIAGNOSIS — D638 Anemia in other chronic diseases classified elsewhere: Secondary | ICD-10-CM | POA: Diagnosis not present

## 2022-08-02 DIAGNOSIS — I952 Hypotension due to drugs: Secondary | ICD-10-CM | POA: Diagnosis not present

## 2022-08-02 DIAGNOSIS — I5042 Chronic combined systolic (congestive) and diastolic (congestive) heart failure: Secondary | ICD-10-CM | POA: Diagnosis not present

## 2022-08-02 DIAGNOSIS — N178 Other acute kidney failure: Secondary | ICD-10-CM | POA: Diagnosis not present

## 2022-08-02 DIAGNOSIS — E211 Secondary hyperparathyroidism, not elsewhere classified: Secondary | ICD-10-CM | POA: Diagnosis not present

## 2022-08-02 DIAGNOSIS — Z8679 Personal history of other diseases of the circulatory system: Secondary | ICD-10-CM | POA: Diagnosis not present

## 2022-08-02 DIAGNOSIS — N184 Chronic kidney disease, stage 4 (severe): Secondary | ICD-10-CM | POA: Diagnosis not present

## 2022-08-02 MED ORDER — LEVOTHYROXINE SODIUM 125 MCG PO TABS: 125.0000 ug | ORAL_TABLET | Freq: Every day | ORAL | 0 refills | Status: DC

## 2022-08-09 DIAGNOSIS — Z85828 Personal history of other malignant neoplasm of skin: Secondary | ICD-10-CM | POA: Diagnosis not present

## 2022-08-09 DIAGNOSIS — Z08 Encounter for follow-up examination after completed treatment for malignant neoplasm: Secondary | ICD-10-CM | POA: Diagnosis not present

## 2022-08-09 DIAGNOSIS — C44222 Squamous cell carcinoma of skin of right ear and external auricular canal: Secondary | ICD-10-CM | POA: Diagnosis not present

## 2022-08-09 DIAGNOSIS — L570 Actinic keratosis: Secondary | ICD-10-CM | POA: Diagnosis not present

## 2022-08-09 DIAGNOSIS — X32XXXD Exposure to sunlight, subsequent encounter: Secondary | ICD-10-CM | POA: Diagnosis not present

## 2022-08-27 ENCOUNTER — Ambulatory Visit (INDEPENDENT_AMBULATORY_CARE_PROVIDER_SITE_OTHER): Payer: Medicare PPO

## 2022-08-27 DIAGNOSIS — Z95810 Presence of automatic (implantable) cardiac defibrillator: Secondary | ICD-10-CM

## 2022-08-27 DIAGNOSIS — I5022 Chronic systolic (congestive) heart failure: Secondary | ICD-10-CM

## 2022-08-29 ENCOUNTER — Other Ambulatory Visit: Payer: Self-pay | Admitting: Cardiology

## 2022-08-30 ENCOUNTER — Ambulatory Visit (INDEPENDENT_AMBULATORY_CARE_PROVIDER_SITE_OTHER): Payer: Medicare PPO

## 2022-08-30 DIAGNOSIS — I428 Other cardiomyopathies: Secondary | ICD-10-CM | POA: Diagnosis not present

## 2022-08-30 LAB — CUP PACEART REMOTE DEVICE CHECK
Battery Remaining Longevity: 8 mo
Battery Voltage: 2.84 V
Brady Statistic AP VP Percent: 92.91 %
Brady Statistic AP VS Percent: 0.17 %
Brady Statistic AS VP Percent: 6.72 %
Brady Statistic AS VS Percent: 0.2 %
Brady Statistic RA Percent Paced: 86.72 %
Brady Statistic RV Percent Paced: 92.99 %
Date Time Interrogation Session: 20231130032903
HighPow Impedance: 79 Ohm
Implantable Lead Connection Status: 753985
Implantable Lead Connection Status: 753985
Implantable Lead Connection Status: 753985
Implantable Lead Implant Date: 20160607
Implantable Lead Implant Date: 20160607
Implantable Lead Implant Date: 20160607
Implantable Lead Location: 753858
Implantable Lead Location: 753859
Implantable Lead Location: 753860
Implantable Lead Model: 4598
Implantable Lead Model: 5076
Implantable Pulse Generator Implant Date: 20160607
Lead Channel Impedance Value: 1140 Ohm
Lead Channel Impedance Value: 1159 Ohm
Lead Channel Impedance Value: 1311 Ohm
Lead Channel Impedance Value: 1425 Ohm
Lead Channel Impedance Value: 342 Ohm
Lead Channel Impedance Value: 437 Ohm
Lead Channel Impedance Value: 589 Ohm
Lead Channel Impedance Value: 627 Ohm
Lead Channel Impedance Value: 665 Ohm
Lead Channel Impedance Value: 779 Ohm
Lead Channel Impedance Value: 817 Ohm
Lead Channel Impedance Value: 969 Ohm
Lead Channel Impedance Value: 988 Ohm
Lead Channel Pacing Threshold Amplitude: 0.5 V
Lead Channel Pacing Threshold Amplitude: 0.625 V
Lead Channel Pacing Threshold Amplitude: 1.875 V
Lead Channel Pacing Threshold Pulse Width: 0.4 ms
Lead Channel Pacing Threshold Pulse Width: 0.4 ms
Lead Channel Pacing Threshold Pulse Width: 0.4 ms
Lead Channel Sensing Intrinsic Amplitude: 1.75 mV
Lead Channel Sensing Intrinsic Amplitude: 1.75 mV
Lead Channel Sensing Intrinsic Amplitude: 11.125 mV
Lead Channel Sensing Intrinsic Amplitude: 11.125 mV
Lead Channel Setting Pacing Amplitude: 1.5 V
Lead Channel Setting Pacing Amplitude: 2 V
Lead Channel Setting Pacing Amplitude: 2.5 V
Lead Channel Setting Pacing Pulse Width: 0.4 ms
Lead Channel Setting Pacing Pulse Width: 0.4 ms
Lead Channel Setting Sensing Sensitivity: 0.3 mV
Zone Setting Status: 755011
Zone Setting Status: 755011

## 2022-08-31 NOTE — Progress Notes (Signed)
EPIC Encounter for ICM Monitoring  Patient Name: KYLOR VALVERDE is a 81 y.o. male Date: 08/31/2022 Primary Care Physican: Ginger Organ., MD Primary Cardiologist: Nishan/McLean Electrophysiologist: Cyndi Bender Pacing: 92%   07/24/2022 Weight 210 lbs 08/31/2022 Weight: 210 lbs   Clinical Status   Since 23-Jul-2022 Time in AT/AF  <0.1 hr/day (0.1%) (taking Xarelto)  Battery Longevity: 8 months     Spoke with patient and heart failure questions reviewed.  Transmission results reviewed.  Pt asymptomatic for fluid accumulation.  Reports feeling well at this time and voices no complaints.     Optivol thoracic impedance suggesting normal fluid levels.     Prescribed:  Furosemide 40 mg 1 tablet (40 mg total) by mouth in the morning and 0.5 tablet (20 mg total)  every evening. Spironolactone 25 mg Take 1 tablet (25 mg total) by mouth daily. Take at bedtime   Labs: 03/15/2022 Creatine 2.01, BUN 26, Potassium 4.5, Sodium 139, GFR 33 A complete set of results can be found in Results Review.   Recommendations:  No changes and encouraged to call if experiencing any fluid symptoms.   Follow-up plan: ICM clinic phone appointment on 10/08/2022.  91 day device clinic remote transmission 11/29/2022.     EP/Cardiology Next Office Visit:   Recall 12/02/2022 with Dr Curt Bears.   Advised to call Dr Claris Gladden office for appointment.  Recall 02/17/2022 with Dr Aundra Dubin for 6 month f/u.    Due to see Dr Johnsie Cancel 11/2022.    Copy of ICM check sent to Dr. Curt Bears.    3 month ICM trend: 08/27/2022.    12-14 Month ICM trend:     Rosalene Billings, RN 08/31/2022 9:04 AM

## 2022-09-04 DIAGNOSIS — N178 Other acute kidney failure: Secondary | ICD-10-CM | POA: Diagnosis not present

## 2022-09-04 DIAGNOSIS — D638 Anemia in other chronic diseases classified elsewhere: Secondary | ICD-10-CM | POA: Diagnosis not present

## 2022-09-04 DIAGNOSIS — N184 Chronic kidney disease, stage 4 (severe): Secondary | ICD-10-CM | POA: Diagnosis not present

## 2022-09-04 DIAGNOSIS — E211 Secondary hyperparathyroidism, not elsewhere classified: Secondary | ICD-10-CM | POA: Diagnosis not present

## 2022-09-04 DIAGNOSIS — I952 Hypotension due to drugs: Secondary | ICD-10-CM | POA: Diagnosis not present

## 2022-09-04 DIAGNOSIS — Z8679 Personal history of other diseases of the circulatory system: Secondary | ICD-10-CM | POA: Diagnosis not present

## 2022-09-04 DIAGNOSIS — I5042 Chronic combined systolic (congestive) and diastolic (congestive) heart failure: Secondary | ICD-10-CM | POA: Diagnosis not present

## 2022-09-10 DIAGNOSIS — N178 Other acute kidney failure: Secondary | ICD-10-CM | POA: Diagnosis not present

## 2022-09-10 DIAGNOSIS — D638 Anemia in other chronic diseases classified elsewhere: Secondary | ICD-10-CM | POA: Diagnosis not present

## 2022-09-10 DIAGNOSIS — I5042 Chronic combined systolic (congestive) and diastolic (congestive) heart failure: Secondary | ICD-10-CM | POA: Diagnosis not present

## 2022-09-10 DIAGNOSIS — N184 Chronic kidney disease, stage 4 (severe): Secondary | ICD-10-CM | POA: Diagnosis not present

## 2022-09-19 NOTE — Progress Notes (Signed)
Remote ICD transmission.   

## 2022-09-21 ENCOUNTER — Inpatient Hospital Stay: Payer: Medicare PPO | Attending: Hematology

## 2022-09-21 DIAGNOSIS — Z79899 Other long term (current) drug therapy: Secondary | ICD-10-CM | POA: Insufficient documentation

## 2022-09-21 DIAGNOSIS — E538 Deficiency of other specified B group vitamins: Secondary | ICD-10-CM | POA: Diagnosis not present

## 2022-09-21 DIAGNOSIS — D649 Anemia, unspecified: Secondary | ICD-10-CM | POA: Insufficient documentation

## 2022-09-21 DIAGNOSIS — D631 Anemia in chronic kidney disease: Secondary | ICD-10-CM

## 2022-09-21 DIAGNOSIS — D696 Thrombocytopenia, unspecified: Secondary | ICD-10-CM

## 2022-09-21 LAB — COMPREHENSIVE METABOLIC PANEL
ALT: 18 U/L (ref 0–44)
AST: 14 U/L — ABNORMAL LOW (ref 15–41)
Albumin: 3.8 g/dL (ref 3.5–5.0)
Alkaline Phosphatase: 57 U/L (ref 38–126)
Anion gap: 7 (ref 5–15)
BUN: 40 mg/dL — ABNORMAL HIGH (ref 8–23)
CO2: 28 mmol/L (ref 22–32)
Calcium: 8.8 mg/dL — ABNORMAL LOW (ref 8.9–10.3)
Chloride: 104 mmol/L (ref 98–111)
Creatinine, Ser: 2.34 mg/dL — ABNORMAL HIGH (ref 0.61–1.24)
GFR, Estimated: 27 mL/min — ABNORMAL LOW (ref >60)
Glucose, Bld: 124 mg/dL — ABNORMAL HIGH (ref 70–99)
Potassium: 4.6 mmol/L (ref 3.5–5.1)
Sodium: 139 mmol/L (ref 135–145)
Total Bilirubin: 0.7 mg/dL (ref 0.3–1.2)
Total Protein: 6.8 g/dL (ref 6.5–8.1)

## 2022-09-21 LAB — CBC WITH DIFFERENTIAL/PLATELET
Abs Immature Granulocytes: 0.02 10*3/uL (ref 0.00–0.07)
Basophils Absolute: 0.1 10*3/uL (ref 0.0–0.1)
Basophils Relative: 1 %
Eosinophils Absolute: 0.1 10*3/uL (ref 0.0–0.5)
Eosinophils Relative: 1 %
HCT: 35.1 % — ABNORMAL LOW (ref 39.0–52.0)
Hemoglobin: 11.5 g/dL — ABNORMAL LOW (ref 13.0–17.0)
Immature Granulocytes: 0 %
Lymphocytes Relative: 18 %
Lymphs Abs: 1.2 10*3/uL (ref 0.7–4.0)
MCH: 33.9 pg (ref 26.0–34.0)
MCHC: 32.8 g/dL (ref 30.0–36.0)
MCV: 103.5 fL — ABNORMAL HIGH (ref 80.0–100.0)
Monocytes Absolute: 0.4 10*3/uL (ref 0.1–1.0)
Monocytes Relative: 7 %
Neutro Abs: 4.7 10*3/uL (ref 1.7–7.7)
Neutrophils Relative %: 73 %
Platelets: 141 10*3/uL — ABNORMAL LOW (ref 150–400)
RBC: 3.39 MIL/uL — ABNORMAL LOW (ref 4.22–5.81)
RDW: 14.5 % (ref 11.5–15.5)
WBC: 6.5 10*3/uL (ref 4.0–10.5)
nRBC: 0 % (ref 0.0–0.2)

## 2022-09-21 LAB — IRON AND TIBC
Iron: 76 ug/dL (ref 45–182)
Saturation Ratios: 27 % (ref 17.9–39.5)
TIBC: 286 ug/dL (ref 250–450)
UIBC: 210 ug/dL

## 2022-09-21 LAB — FERRITIN: Ferritin: 300 ng/mL (ref 24–336)

## 2022-09-21 LAB — FOLATE: Folate: 14.8 ng/mL (ref >5.9)

## 2022-09-21 LAB — VITAMIN B12: Vitamin B-12: 409 pg/mL (ref 180–914)

## 2022-09-25 LAB — METHYLMALONIC ACID, SERUM: Methylmalonic Acid, Quantitative: 341 nmol/L (ref 0–378)

## 2022-09-28 ENCOUNTER — Ambulatory Visit (HOSPITAL_COMMUNITY): Payer: Medicare PPO | Admitting: Physician Assistant

## 2022-09-30 NOTE — Progress Notes (Unsigned)
[place],  [id number]   CLINIC:  Medical Oncology/Hematology  PCP:  Ginger Organ., MD [place] [id number] [phone]   REASON FOR VISIT:  Follow-up for normocytic anemia and thrombocytopenia  CURRENT THERAPY: Iron Gummies, B12/folate supplements  INTERVAL HISTORY:  [name] 81 y.o. male returns for routine follow-up of his normocytic anemia and thrombocytopenia.  He was last seen by Tarri Abernethy, PA-C on 03/22/2022.  At today's visit, he reports feeling well.  He had radioablation of his thyroid in January 2023, thyroidectomy in August 2022.  He denies any other major health events or changes in his baseline health status since his last visit.  He is taking iron supplement daily at home.  He has been taking B12 500 mcg daily and folic acid 176 mcg daily since his last visit in June 2023.  He reports some mild fatigue with baseline energy about 70%.   He admits to mild cough as well as some diarrhea, which are chronic/intermittent.  He denies any B symptoms such as fever, chills, night sweats, unintentional weight loss.   No shortness of breath, chest pain, nausea, vomiting, abdominal pain.   He has not noticed any recent bleeding events such as hematemesis, hematochezia, or melena.   He does bruise easily, but notes that he is also on Xarelto for atrial fibrillation. He has 70% energy and 100% appetite. He endorses that he is maintaining a stable weight.   REVIEW OF SYSTEMS:  Review of Systems  Constitutional:  Positive for fatigue. Negative for appetite change, chills, diaphoresis, fever and unexpected weight change.  HENT:   Negative for lump/mass and nosebleeds.   Eyes:  Negative for eye problems.  Respiratory:  Positive for cough. Negative for hemoptysis and shortness of breath.   Cardiovascular:  Negative for chest pain, leg swelling and palpitations.  Gastrointestinal:  Positive for diarrhea.  Negative for abdominal pain, blood in stool, constipation, nausea and vomiting.  Genitourinary:  Negative for hematuria.   Musculoskeletal:  Positive for arthralgias.  Skin: Negative.   Neurological:  Positive for numbness (left arm). Negative for dizziness, headaches and light-headedness.  Hematological:  Does not bruise/bleed easily.      PAST MEDICAL/SURGICAL HISTORY:  Past Medical History:  Diagnosis Date   Adenomatous colon polyp    tubular   AICD (automatic cardioverter/defibrillator) present 03/08/2015   MDT CRTD dual pacemaker and defib   Anemia    iron deficient   Arthritis    "about all my joints; hands, knees, back" (03/08/2015)   Atherosclerosis    Cataract    left eye small   Cholelithiasis    gallstones   Chronic systolic CHF (congestive heart failure) ([place])    a. New dx 12/2012 ? NICM, may be r/t afib. b. Nuc 03/2013 - normal;  c. 03/2015 TEE EF 15-20%.   Colon polyp, hyperplastic 01/2015   removed precancerous lesions   Depression    Diverticulosis    Dysrhythmia    afib   GERD (gastroesophageal reflux disease)    Glaucoma    right eye   Hyperlipidemia    Hypertension    Melanoma of eye ([place]) 2000's   "right; it's never been biopsied"   Melanoma of lower leg ([place]) 2015   "left; right at my knee"   Myocardial infarction ([place]) 1998   OSA (obstructive sleep apnea) 01/04/2016   no longer tolerates cpap   Peripheral vision loss, right 2006  Persistent atrial fibrillation ([place])    a. Dx 12/2012, s/p TEE/DCCV 01/26/13. b. On Xarelto (CHA2DS2VASc = 3);  c. 03/2015 TEE (EF 15-20%, no LAA thrombus) and DCCV - amio increased to 200 mg bid.   Pneumonia    Urinary hesitancy due to benign prostatic hypertrophy    Past Surgical History:  Procedure Laterality Date   ATRIAL FIBRILLATION ABLATION N/A 11/03/2019   Procedure: ATRIAL FIBRILLATION ABLATION;  Surgeon: Thompson Grayer, MD;  Location: [facility];  Service: Cardiovascular;  Laterality: N/A;   BACK SURGERY      upper back, cannot turn neck well   [place] N/A 01/26/2013   Procedure: CARDIOVERSION;  Surgeon: Lelon Perla, MD;  Location: [facility];  Service: Cardiovascular;  Laterality: N/A;   CARDIOVERSION N/A 03/23/2015   Procedure: CARDIOVERSION;  Surgeon: Jerline Pain, MD;  Location: [facility];  Service: Cardiovascular;  Laterality: N/A;   CARDIOVERSION N/A 08/14/2017   Procedure: CARDIOVERSION;  Surgeon: Josue Hector, MD;  Location: [facility];  Service: Cardiovascular;  Laterality: N/A;   CATARACT EXTRACTION Right ~ 2006   COLONOSCOPY WITH PROPOFOL N/A 02/10/2015   Procedure: COLONOSCOPY WITH PROPOFOL;  Surgeon: Gatha Mayer, MD;  [facility]: [facility];  Service: Endoscopy;  Laterality: N/A;   COLONOSCOPY WITH PROPOFOL N/A 08/07/2016   Procedure: COLONOSCOPY WITH PROPOFOL;  Surgeon: Gatha Mayer, MD;  Location: [facility];  Service: Endoscopy;  Laterality: N/A;   ENTEROSCOPY N/A 08/17/2015   Procedure: ENTEROSCOPY;  Surgeon: Gatha Mayer, MD;  [facility];  Service: Endoscopy;  Laterality: N/A;   EP IMPLANTABLE DEVICE N/A 03/08/2015   MDT Hillery Aldo CRT-D for nonischemic CM by Dr Rayann Heman for primary prevention   GLAUCOMA SURGERY Right ~ 2006   "put 3 stents in to drain fluid" (03/08/2015) not successful, sent to [facility] to try to get last stent out   HOT HEMOSTASIS N/A 08/07/2016   Procedure: HOT HEMOSTASIS (ARGON PLASMA COAGULATION/BICAP);  Surgeon: Gatha Mayer, MD;  Location: [facility];  Service: Endoscopy;  Laterality: N/A;   INCISION AND DRAINAGE ABSCESS POSTERIOR CERVICALSPINE  05/2012   JOINT REPLACEMENT     MELANOMA EXCISION Left 2015   "lower leg; right at my knee"   REFRACTIVE SURGERY Right ~ 2006 X 2   "twice; both done at [facility]" (03/08/2015   SURGERY SCROTAL / TESTICULAR Right 1990's   TEE WITHOUT CARDIOVERSION N/A 01/26/2013   Procedure: TRANSESOPHAGEAL ECHOCARDIOGRAM (TEE);  Surgeon: Lelon Perla, MD;  Location:  [facility];  Service: Cardiovascular;  Laterality: N/A;  [name] anes. /    TEE WITHOUT CARDIOVERSION N/A 10/05/2014   Procedure: TRANSESOPHAGEAL ECHOCARDIOGRAM (TEE)  with cardioversion;  Surgeon: Thayer Headings, MD;  Location: [facility];  Service: Cardiovascular;  Laterality: N/A;  12:52 synched cardioversion at 120 joules,...afib to SR...12 lead EKG ordered.Marland KitchenMarland KitchenCardiozem d/c'ed per MD verbal order at SR   TEE WITHOUT CARDIOVERSION N/A 03/23/2015   Procedure: TRANSESOPHAGEAL ECHOCARDIOGRAM (TEE);  Surgeon: Jerline Pain, MD;  Location: [facility];  Service: Cardiovascular;  Laterality: N/A;   TEE WITHOUT CARDIOVERSION N/A 11/02/2019   Procedure: TRANSESOPHAGEAL ECHOCARDIOGRAM (TEE);  Surgeon: Josue Hector, MD;  Location: [facility] ENDOSCOPY;  Service: Cardiovascular;  Laterality: N/A;   THORACIC SPINE SURGERY  03/2000   "ground calcium deposits from upper thoracic" (01/26/2013)   THYROIDECTOMY N/A 05/11/2021   Procedure: TOTAL THYROIDECTOMY;  Surgeon: Armandina Gemma, MD;  Location: [facility];  Service: General;  Laterality: N/A;   TOTAL  HIP ARTHROPLASTY Right 06/2007     SOCIAL HISTORY:  Social History   Socioeconomic History   Marital status: Married    Spouse name: Not on file   Number of children: 3   Years of education: Not on file   Highest education level: Not on file  Occupational History   Occupation: retired  Tobacco Use   Smoking status: Former    Packs/day: 3.00    Years: 48.00    Total pack years: 144.00    Types: Cigarettes    Quit date: 09/28/2000    Years since quitting: 22.0   Smokeless tobacco: Never  Vaping Use   Vaping Use: Never used  Substance and Sexual Activity   Alcohol use: No   Drug use: No   Sexual activity: Not Currently  Other Topics Concern   Not on file  Social History Narrative   Pt lives in [place] with spouse.  3 children are grown and healthy.   Retired.  Ran a country store for 30 years, previously worked in the [employer] for 16 years    Social Determinants of Radio broadcast assistant Strain: Not on [employer] Insecurity: Not on file  Transportation Needs: No Transportation Needs (11/03/2020)   PRAPARE - Hydrologist (Medical): No    Lack of Transportation (Non-Medical): No  Physical Activity: Inactive (11/03/2020)   Exercise Vital Sign    Days of Exercise per Week: 0 days    Minutes of Exercise per Session: 0 min  Stress: Not on file  Social Connections: Not on file  Intimate Partner Violence: Not At Risk (11/03/2020)   Humiliation, Afraid, Rape, and Kick questionnaire    Fear of Current or Ex-Partner: No    Emotionally Abused: No    Physically Abused: No    Sexually Abused: No    FAMILY HISTORY:  Family History  Problem Relation Age of Onset   Kidney disease Mother    Heart disease Mother        MI, open heart   Diabetes Mother        dialysis   Leukemia Father    Colon cancer Paternal Uncle    Lung cancer Paternal Uncle        x 2   Prostate cancer Paternal Uncle    Diabetes Maternal Grandmother    Heart attack Maternal Uncle    Diabetes Maternal Aunt        x 3   Diabetes Maternal Uncle    Thyroid disease Sister     CURRENT MEDICATIONS:  Outpatient Encounter Medications as of 10/02/2022  Medication Sig   calcitRIOL (ROCALTROL) 0.25 MCG capsule Take 1 capsule by mouth once a week.   calcium carbonate (TUMS - DOSED IN MG ELEMENTAL CALCIUM) 500 MG chewable tablet Chew 1 tablet by mouth daily.   carvedilol (COREG) 12.5 MG tablet Take 1 tablet (12.5 mg total) by mouth 2 (two) times daily.   dorzolamide-timolol (COSOPT) 22.3-6.8 MG/ML ophthalmic solution Place 1 drop into the right eye 2 (two) times daily.   escitalopram (LEXAPRO) 20 MG tablet Take 1 tablet (20 mg total) by mouth daily.   ferrous sulfate 325 (65 FE) MG tablet Take 325 mg by mouth daily with breakfast.   finasteride (PROSCAR) 5 MG tablet Take 1 tablet (5 mg total) by mouth daily.   furosemide (LASIX) 40 MG  tablet TAKE 1 TABLET EVERY MORNING & 1/2 TABLET EVERY EVENING   gabapentin (NEURONTIN) 100  MG capsule Take 200 mg by mouth 2 (two) times daily.    JARDIANCE 10 MG TABS tablet TAKE 1 TABLET DAILY BEFORE BREAKFAST   latanoprost (XALATAN) 0.005 % ophthalmic solution Place 1 drop into the right eye at bedtime.   levothyroxine (SYNTHROID) 125 MCG tablet Take 1 tablet (125 mcg total) by mouth daily.   nitroGLYCERIN (NITROSTAT) 0.4 MG SL tablet Place 0.4 mg under the tongue every 5 (five) minutes as needed for chest pain.   Polyethyl Glycol-Propyl Glycol 0.4-0.3 % SOLN Place 1 drop into the left eye in the morning and at bedtime.   REPATHA SURECLICK 408 MG/ML SOAJ Inject 1 pen into the skin every 14 (fourteen) days.   RHOPRESSA 0.02 % SOLN Place 1 drop into the right eye at bedtime.   sacubitril-valsartan (ENTRESTO) 24-26 MG Take 0.5 tablets by mouth 2 (two) times daily.   spironolactone (ALDACTONE) 25 MG tablet Take 25 mg by mouth daily. Pt takes 1/2 table 12.5 mg in the morning and 1/2 table 12.5 mg at night.   tamsulosin (FLOMAX) 0.4 MG CAPS Take 0.4 mg by mouth at bedtime.    VITAMIN D PO Take 1 tablet by mouth daily.   XARELTO 15 MG TABS tablet TAKE (1) TABLET DAILY WITH SUPPER.   No facility-administered encounter medications on file as of 10/02/2022.    ALLERGIES:  Allergies  Allergen Reactions   Pravastatin Sodium Other (See Comments)    Joint and muscle pain   Azithromycin Diarrhea     PHYSICAL EXAM:  ECOG PERFORMANCE STATUS: 0 - Asymptomatic  There were no vitals filed for this visit. There were no vitals filed for this visit. Physical Exam Vitals reviewed.  Constitutional:      Appearance: Normal appearance. He is obese.  Cardiovascular:     Rate and Rhythm: Normal rate and regular rhythm.     Pulses: Normal pulses.     Heart sounds: Normal heart sounds.  Pulmonary:     Effort: Pulmonary effort is normal.     Breath sounds: Normal breath sounds.  Neurological:      General: No focal deficit present.     Mental Status: He is alert and oriented to person, place, and time.  Psychiatric:        Mood and Affect: Mood normal.        Behavior: Behavior normal.      LABORATORY DATA:  I have reviewed the labs as listed.  CBC    Component Value Date/Time   WBC 6.5 09/21/2022 1005   RBC 3.39 (L) 09/21/2022 1005   HGB 11.5 (L) 09/21/2022 1005   HCT 35.1 (L) 09/21/2022 1005   PLT 141 (L) 09/21/2022 1005   MCV 103.5 (H) 09/21/2022 1005   MCH 33.9 09/21/2022 1005   MCHC 32.8 09/21/2022 1005   RDW 14.5 09/21/2022 1005   LYMPHSABS 1.2 09/21/2022 1005   MONOABS 0.4 09/21/2022 1005   EOSABS 0.1 09/21/2022 1005   BASOSABS 0.1 09/21/2022 1005      Latest Ref Rng & Units 09/21/2022   10:05 AM 03/15/2022   10:09 AM 10/24/2021    3:35 PM  CMP  Glucose 70 - 99 mg/dL 124  114    BUN 8 - 23 mg/dL 40  26    Creatinine 0.61 - 1.24 mg/dL 2.34  2.01    Sodium 135 - 145 mmol/L 139  139    Potassium 3.5 - 5.1 mmol/L 4.6  4.5    Chloride 98 - 111 mmol/L 104  104    CO2 22 - 32 mmol/L 28  29    Calcium 8.9 - 10.3 mg/dL 8.8  8.8  9.4   Total Protein 6.5 - 8.1 g/dL 6.8  6.9    Total Bilirubin 0.3 - 1.2 mg/dL 0.7  0.4    Alkaline Phos 38 - 126 U/L 57  58    AST 15 - 41 U/L 14  13    ALT 0 - 44 U/L 18  16      DIAGNOSTIC IMAGING:  I have independently reviewed the relevant imaging and discussed with the patient.  ASSESSMENT & PLAN: 1.  Normocytic anemia: - Combination anemia from CKD and relative iron deficiency. - He takes a daily iron gummy, B12 500 mcg, folate 400 mcg  - No bright red blood per rectum or melena  - Most recent labs (09/21/2022): Hgb 11.5/MCV 103.5.  Ferritin 300, iron saturation 27%. Baseline CKD stage IIIb/IV. - Additional labs (09/21/2022) show improved vitamin B12 at 409, deficiency at 101.  Improved folate 14.8.   - PLAN: Hemoglobin at goal. - Continue daily iron supplement plus vitamin B12 500 mcg daily and folic acid 409 mcg  daily. - Repeat labs and RTC in 6 months -- Would consider ESA if Hgb < 9.5 with normal iron stores  2.  Mild thrombocytopenia: - He has intermittent mild thrombocytopenia for several years duration. -- B12 and folate deficiency noted June 2023 - He takes a daily iron gummy, B12 500 mcg, folate 400 mcg  - CBC from 09/21/2022 shows platelets 141 as well as mild macrocytosis with MCV 103.5.  Additional labs (09/21/2022) show improved vitamin B12 at 409, deficiency at 101.  Improved folate 14.8.   -- DIFFERENTIAL DIAGNOSIS: Thrombocytopenia secondary to nutritional deficiencies, versus mild ITP, versus early MDS - PLAN: Continue vitamin B12 500 mcg daily and folic acid 811 mcg daily. - Repeat labs and RTC in 6 months  3.  Lung nodules: - He is a former smoker, smoked more than 2 packs/day for many years and quit in 2001. - Follows with [facility] pulmonology for annual CT chest, most recently completed on 06/11/2022  4.  T2 N0 melanoma of the left thigh: - Status post resection in 2015 at [facility].  5.  Papillary thyroid carcinoma: - Thyroid surgery by Dr. Harlow Asa (total thyroidectomy 05/11/2021) - He had radioactive iodine pill in January 2023 under the direction of Dr. Loanne Drilling and Dr. Leonia Reeves - Reports that his endocrinologist moved.  We will place referral to establish care with a new endocrinologist, Dr. Dorris Fetch.  6.  CKD stage IIIb/IV - Follows with Dr. Theador Hawthorne.  7.  Other history - PMH: Paroxysmal atrial fibrillation (on Xarelto), hypertension, hyperlipidemia, systolic CHF, sleep apnea, coronary artery disease   PLAN SUMMARY: >> Labs in 6 months (CBC/D, CMP, ferritin, iron/TIBC, B12, MMA, folate) >> Office visit 1 week after labs >> Referral to Dr. Dorris Fetch for ongoing monitoring of history of thyroid cancer s/p thyroidectomy   All questions were answered. The patient knows to call the clinic with any problems, questions or concerns.  Medical decision making: Moderate  Time spent on  visit: I spent 20 minutes counseling the patient face to face. The total time spent in the appointment was 30 minutes and more than 50% was on counseling.   Harriett Rush, PA-C  10/02/22 10:51 AM

## 2022-10-02 ENCOUNTER — Inpatient Hospital Stay: Payer: Medicare PPO | Attending: Physician Assistant | Admitting: Physician Assistant

## 2022-10-02 ENCOUNTER — Other Ambulatory Visit: Payer: Self-pay

## 2022-10-02 VITALS — BP 112/64 | HR 84 | Temp 97.5°F | Resp 17 | Ht 66.0 in | Wt 217.5 lb

## 2022-10-02 DIAGNOSIS — R918 Other nonspecific abnormal finding of lung field: Secondary | ICD-10-CM

## 2022-10-02 DIAGNOSIS — D696 Thrombocytopenia, unspecified: Secondary | ICD-10-CM | POA: Insufficient documentation

## 2022-10-02 DIAGNOSIS — D631 Anemia in chronic kidney disease: Secondary | ICD-10-CM | POA: Diagnosis not present

## 2022-10-02 DIAGNOSIS — E538 Deficiency of other specified B group vitamins: Secondary | ICD-10-CM

## 2022-10-02 DIAGNOSIS — I252 Old myocardial infarction: Secondary | ICD-10-CM | POA: Diagnosis not present

## 2022-10-02 DIAGNOSIS — Z79899 Other long term (current) drug therapy: Secondary | ICD-10-CM | POA: Diagnosis not present

## 2022-10-02 DIAGNOSIS — N1832 Chronic kidney disease, stage 3b: Secondary | ICD-10-CM | POA: Diagnosis not present

## 2022-10-02 DIAGNOSIS — I110 Hypertensive heart disease with heart failure: Secondary | ICD-10-CM | POA: Diagnosis not present

## 2022-10-02 DIAGNOSIS — D649 Anemia, unspecified: Secondary | ICD-10-CM | POA: Diagnosis not present

## 2022-10-02 DIAGNOSIS — I4891 Unspecified atrial fibrillation: Secondary | ICD-10-CM | POA: Insufficient documentation

## 2022-10-02 DIAGNOSIS — C439 Malignant melanoma of skin, unspecified: Secondary | ICD-10-CM

## 2022-10-02 DIAGNOSIS — Z7901 Long term (current) use of anticoagulants: Secondary | ICD-10-CM | POA: Diagnosis not present

## 2022-10-02 DIAGNOSIS — I5022 Chronic systolic (congestive) heart failure: Secondary | ICD-10-CM | POA: Insufficient documentation

## 2022-10-02 NOTE — Patient Instructions (Signed)
[place] at [facility] Discharge Instructions  You were seen today by Tarri Abernethy PA-C for your anemia and low platelets.    Your mild anemia is stable with hemoglobin 11.5.  This is related to underlying chronic kidney disease.  We will continue to monitor your blood counts but you do not need treatment for your anemia at this time.  Your platelets are mildly low at 141, but you are not at risk of severe bleeding at this level.  You do not need any treatment for your low platelets at this time, but we will continue to monitor.  Continue taking iron gummy supplement daily + Vitamin L97 and folic acid daily.  ** We have entered a referral for you to be seen by Dr. Dorris Fetch (endocrinology) for ongoing monitoring of your thyroid levels.  If you have not heard from his office within the next 2 weeks, please call our office so that we can assist.  LABS: Return in 6 months for repeat labs  FOLLOW-UP APPOINTMENT: Office visit in 6 months, after labs  ** Thank you for trusting me with your healthcare!  I strive to provide all of my patients with quality care at each visit.  If you receive a survey for this visit, I would be so grateful to you for taking the time to provide feedback.  Thank you in advance!  ~ Mckenleigh Tarlton                   Dr. Derek Jack   &   Tarri Abernethy, PA-C   - - - - - - - - - - - - - - - - - -     Thank you for choosing [place] at [facility] to provide your oncology and hematology care.  To afford each patient quality time with our provider, please arrive at least 15 minutes before your scheduled appointment time.   If you have a lab appointment with the [facility] please come in thru the Main Entrance and check in at the main information desk.  You need to re-schedule your appointment should you arrive 10 or more minutes late.  We strive to give you quality time with our providers, and arriving late  affects you and other patients whose appointments are after yours.  Also, if you no show three or more times for appointments you may be dismissed from the clinic at the providers discretion.     Again, thank you for choosing [facility].  Our hope is that these requests will decrease the amount of time that you wait before being seen by our physicians.       _____________________________________________________________  Should you have questions after your visit to [facility], please contact our office at [phone] and follow the prompts.  Our office hours are 8:00 a.m. and 4:30 p.m. Monday - Friday.  Please note that voicemails left after 4:00 p.m. may not be returned until the following business day.  We are closed weekends and major holidays.  You do have access to a nurse 24-7, just call the main number to the clinic [phone] and do not press any options, hold on the line and a nurse will answer the phone.    For prescription refill requests, have your pharmacy contact our office and allow 72 hours.    Due to Covid, you will need to wear a mask upon entering the hospital. If you  do not have a mask, a mask will be given to you at the Main Entrance upon arrival. For doctor visits, patients may have 1 support person age 43 or older with them. For treatment visits, patients can not have anyone with them due to social distancing guidelines and our immunocompromised population.

## 2022-10-08 ENCOUNTER — Ambulatory Visit (INDEPENDENT_AMBULATORY_CARE_PROVIDER_SITE_OTHER): Payer: Medicare PPO

## 2022-10-08 ENCOUNTER — Other Ambulatory Visit (HOSPITAL_COMMUNITY): Payer: Self-pay

## 2022-10-08 DIAGNOSIS — Z95810 Presence of automatic (implantable) cardiac defibrillator: Secondary | ICD-10-CM | POA: Diagnosis not present

## 2022-10-08 DIAGNOSIS — I5022 Chronic systolic (congestive) heart failure: Secondary | ICD-10-CM | POA: Diagnosis not present

## 2022-10-10 ENCOUNTER — Emergency Department (HOSPITAL_COMMUNITY): Payer: Medicare PPO

## 2022-10-10 ENCOUNTER — Observation Stay (HOSPITAL_COMMUNITY)
Admission: EM | Admit: 2022-10-10 | Discharge: 2022-10-11 | Disposition: A | Discharge status: Home / Self Care | Payer: Medicare PPO | Attending: Internal Medicine | Admitting: Internal Medicine

## 2022-10-10 ENCOUNTER — Telehealth: Payer: Self-pay

## 2022-10-10 ENCOUNTER — Other Ambulatory Visit: Payer: Self-pay

## 2022-10-10 DIAGNOSIS — E039 Hypothyroidism, unspecified: Secondary | ICD-10-CM | POA: Insufficient documentation

## 2022-10-10 DIAGNOSIS — S2231XA Fracture of one rib, right side, initial encounter for closed fracture: Secondary | ICD-10-CM | POA: Diagnosis not present

## 2022-10-10 DIAGNOSIS — I2510 Atherosclerotic heart disease of native coronary artery without angina pectoris: Secondary | ICD-10-CM | POA: Insufficient documentation

## 2022-10-10 DIAGNOSIS — Z950 Presence of cardiac pacemaker: Secondary | ICD-10-CM | POA: Insufficient documentation

## 2022-10-10 DIAGNOSIS — M545 Low back pain, unspecified: Secondary | ICD-10-CM | POA: Diagnosis not present

## 2022-10-10 DIAGNOSIS — R55 Syncope and collapse: Secondary | ICD-10-CM | POA: Diagnosis not present

## 2022-10-10 DIAGNOSIS — Z79899 Other long term (current) drug therapy: Secondary | ICD-10-CM | POA: Diagnosis not present

## 2022-10-10 DIAGNOSIS — I6782 Cerebral ischemia: Secondary | ICD-10-CM | POA: Diagnosis not present

## 2022-10-10 DIAGNOSIS — I951 Orthostatic hypotension: Secondary | ICD-10-CM

## 2022-10-10 DIAGNOSIS — Z87891 Personal history of nicotine dependence: Secondary | ICD-10-CM | POA: Diagnosis not present

## 2022-10-10 DIAGNOSIS — R102 Pelvic and perineal pain: Secondary | ICD-10-CM | POA: Diagnosis not present

## 2022-10-10 DIAGNOSIS — I110 Hypertensive heart disease with heart failure: Secondary | ICD-10-CM | POA: Diagnosis not present

## 2022-10-10 DIAGNOSIS — I959 Hypotension, unspecified: Principal | ICD-10-CM | POA: Insufficient documentation

## 2022-10-10 DIAGNOSIS — D509 Iron deficiency anemia, unspecified: Secondary | ICD-10-CM | POA: Insufficient documentation

## 2022-10-10 DIAGNOSIS — Z043 Encounter for examination and observation following other accident: Secondary | ICD-10-CM | POA: Diagnosis not present

## 2022-10-10 DIAGNOSIS — Z7901 Long term (current) use of anticoagulants: Secondary | ICD-10-CM | POA: Insufficient documentation

## 2022-10-10 DIAGNOSIS — W19XXXA Unspecified fall, initial encounter: Secondary | ICD-10-CM | POA: Insufficient documentation

## 2022-10-10 DIAGNOSIS — I5022 Chronic systolic (congestive) heart failure: Secondary | ICD-10-CM | POA: Insufficient documentation

## 2022-10-10 LAB — COMPREHENSIVE METABOLIC PANEL
ALT: 21 U/L (ref 0–44)
AST: 17 U/L (ref 15–41)
Albumin: 3.6 g/dL (ref 3.5–5.0)
Alkaline Phosphatase: 52 U/L (ref 38–126)
Anion gap: 9 (ref 5–15)
BUN: 38 mg/dL — ABNORMAL HIGH (ref 8–23)
CO2: 25 mmol/L (ref 22–32)
Calcium: 9 mg/dL (ref 8.9–10.3)
Chloride: 98 mmol/L (ref 98–111)
Creatinine, Ser: 2.35 mg/dL — ABNORMAL HIGH (ref 0.61–1.24)
GFR, Estimated: 27 mL/min — ABNORMAL LOW (ref >60)
Glucose, Bld: 171 mg/dL — ABNORMAL HIGH (ref 70–99)
Potassium: 4.3 mmol/L (ref 3.5–5.1)
Sodium: 132 mmol/L — ABNORMAL LOW (ref 135–145)
Total Bilirubin: 0.4 mg/dL (ref 0.3–1.2)
Total Protein: 6.8 g/dL (ref 6.5–8.1)

## 2022-10-10 LAB — I-STAT CHEM 8, ED
BUN: 36 mg/dL — ABNORMAL HIGH (ref 8–23)
Calcium, Ion: 1.19 mmol/L (ref 1.15–1.40)
Chloride: 99 mmol/L (ref 98–111)
Creatinine, Ser: 2.5 mg/dL — ABNORMAL HIGH (ref 0.61–1.24)
Glucose, Bld: 159 mg/dL — ABNORMAL HIGH (ref 70–99)
HCT: 35 % — ABNORMAL LOW (ref 39.0–52.0)
Hemoglobin: 11.9 g/dL — ABNORMAL LOW (ref 13.0–17.0)
Potassium: 4.1 mmol/L (ref 3.5–5.1)
Sodium: 137 mmol/L (ref 135–145)
TCO2: 26 mmol/L (ref 22–32)

## 2022-10-10 LAB — TROPONIN I (HIGH SENSITIVITY)
Troponin I (High Sensitivity): 12 ng/L (ref <18)
Troponin I (High Sensitivity): 12 ng/L (ref <18)

## 2022-10-10 LAB — CBC
HCT: 36.1 % — ABNORMAL LOW (ref 39.0–52.0)
Hemoglobin: 11.5 g/dL — ABNORMAL LOW (ref 13.0–17.0)
MCH: 33.1 pg (ref 26.0–34.0)
MCHC: 31.9 g/dL (ref 30.0–36.0)
MCV: 104 fL — ABNORMAL HIGH (ref 80.0–100.0)
Platelets: 144 10*3/uL — ABNORMAL LOW (ref 150–400)
RBC: 3.47 MIL/uL — ABNORMAL LOW (ref 4.22–5.81)
RDW: 14.2 % (ref 11.5–15.5)
WBC: 7.5 10*3/uL (ref 4.0–10.5)
nRBC: 0 % (ref 0.0–0.2)

## 2022-10-10 LAB — URINALYSIS, ROUTINE W REFLEX MICROSCOPIC
Bilirubin Urine: NEGATIVE
Glucose, UA: >=500 mg/dL — AB
Hgb urine dipstick: NEGATIVE
Ketones, ur: NEGATIVE mg/dL
Leukocytes,Ua: NEGATIVE
Nitrite: NEGATIVE
Protein, ur: NEGATIVE mg/dL
Specific Gravity, Urine: 1.01 (ref 1.005–1.030)
pH: 5 (ref 5.0–8.0)

## 2022-10-10 LAB — SAMPLE TO BLOOD BANK

## 2022-10-10 LAB — LACTIC ACID, PLASMA: Lactic Acid, Venous: 1.8 mmol/L (ref 0.5–1.9)

## 2022-10-10 LAB — PROTIME-INR
INR: 1.7 — ABNORMAL HIGH (ref 0.8–1.2)
Prothrombin Time: 20 seconds — ABNORMAL HIGH (ref 11.4–15.2)

## 2022-10-10 LAB — ETHANOL: Alcohol, Ethyl (B): <10 mg/dL (ref <10)

## 2022-10-10 MED ORDER — ONDANSETRON HCL 4 MG/2ML IJ SOLN
4.0000 mg | Freq: Once | INTRAMUSCULAR | Status: AC
  Administered 2022-10-10: 4 mg via INTRAVENOUS
  Filled 2022-10-10: qty 2

## 2022-10-10 MED ORDER — SODIUM CHLORIDE 0.9 % IV BOLUS
500.0000 mL | Freq: Once | INTRAVENOUS | Status: AC
  Administered 2022-10-10: 500 mL via INTRAVENOUS

## 2022-10-10 MED ORDER — FENTANYL CITRATE PF 50 MCG/ML IJ SOSY
50.0000 ug | PREFILLED_SYRINGE | Freq: Once | INTRAMUSCULAR | Status: AC
  Administered 2022-10-10: 50 ug via INTRAVENOUS
  Filled 2022-10-10: qty 1

## 2022-10-10 NOTE — ED Notes (Signed)
Transit called x2 for patient transport.

## 2022-10-10 NOTE — ED Notes (Signed)
Fluids started. RN will ambulate patient after complete.

## 2022-10-10 NOTE — ED Triage Notes (Signed)
Pt passed out at gym today approx 1 hour pta. Witnessed fall and hit head. Pt is on blood thinners. Pt complains of back pain but no head pain.  PA in to assess at 1317

## 2022-10-10 NOTE — H&P (Signed)
History and Physical    Chad Brown:[id number] DOB: 09-18-1941 DOA: 10/10/2022  PCP: Ginger Organ., MD   Chief Complaint: Syncope  HPI: Chad Brown is a 82 y.o. male with medical history significant of atrial fibrillation status post ablation and pacemaker placement chronically on anticoagulation with Xarelto, heart failure, systolic EF 40 to 87% with global hypokinesis of the left ventricle, CAD, iron deficiency/chronic anemia of chronic disease, hypertension, hyperlipidemia, hypothyroidism, obesity, sleep apnea.  Patient presents after syncopal event after working out at the gym today.  He denies any recent symptoms or changes in his activity levels.  He worked out his normal routine with no issues.  Approximately 1 minute after finishing workup patient stood up and walked across the gym where he subsequently had a syncopal event with no prodrome.  ED Course: EMS noted profound hypotension on the scene, improving with IV fluids in the ED.  Patient confirms longstanding history of orthostatic hypotension at bedside, wife concurs.  Pacemaker evaluated without any events, telemetry thus far has been unremarkable.  Lengthy discussion with patient and wife about overnight observation versus discharging with close cardiology follow-up, family agreeable to do monitor overnight on telemetry to ensure no ongoing symptoms or recurrent event.  Review of Systems: As per HPI patient denies any nausea vomiting diarrhea constipation headache fevers chills chest pain shortness of breath.   Assessment/Plan Principal Problem:   Hypotension   Syncopal event, likely orthostatic hypotension -Given hypotension via EMS and orthostatic symptoms in the ED will continue IV fluids until p.o. intake is appropriate -Follow repeat orthostatics -Pacemaker evaluation unremarkable for events -Imaging after fall unremarkable as below -No postictal period, seizure unlikely  A-fib status post ablation and  pacemaker on anticoagulation chronically -Resume Xarelto given stable imaging -Heart rate currently well-controlled on home regimen  Heart failure EF 40 to 45% with global hypokinesis -IV fluids ongoing as above, monitor closely -Recommend close follow-up with cardiology in the next 1 to 2 weeks to discuss adjusting diuretics if appropriate  Iron deficiency/chronic anemia of chronic disease, stable -Anemia appears to be at baseline, repeat morning labs no signs or symptoms of bleeding  Hypertension -Discussed orthostatic hypotension with patient and wife, consider decreasing or discontinuing blood pressure medications  -currently holding all blood pressure and heart rate medications -resume as appropriate  Hyperlipidemia-Resume home meds Hypothyroidism-unlikely etiology for his syncopal event given no prodrome or prior symptoms and single event.  Follow-up TSH T4 T3 levels with PCP as appropriate  DVT prophylaxis: Continue home Xarelto Code Status: Full Family Communication: Wife at bedside Status is: obs  Dispo: The patient is from: home              Anticipated d/c is to: home              Anticipated d/c date is: 24h              Patient currently not medically stable for discharge - continued monitoring overnight in the setting of ongoing symptoms/orthostatic hypotension  Consultants:  None  Procedures:  None   Past Medical History:  Diagnosis Date   Adenomatous colon polyp    tubular   AICD (automatic cardioverter/defibrillator) present 03/08/2015   MDT CRTD dual pacemaker and defib   Anemia    iron deficient   Arthritis    "about all my joints; hands, knees, back" (03/08/2015)   Atherosclerosis    Cataract    left eye small   Cholelithiasis  gallstones   Chronic systolic CHF (congestive heart failure) ([place])    a. New dx 12/2012 ? NICM, may be r/t afib. b. Nuc 03/2013 - normal;  c. 03/2015 TEE EF 15-20%.   Colon polyp, hyperplastic 01/2015   removed precancerous  lesions   Depression    Diverticulosis    Dysrhythmia    afib   GERD (gastroesophageal reflux disease)    Glaucoma    right eye   Hyperlipidemia    Hypertension    Melanoma of eye ([place]) 2000's   "right; it's never been biopsied"   Melanoma of lower leg ([place]) 2015   "left; right at my knee"   Myocardial infarction ([place]) 1998   OSA (obstructive sleep apnea) 01/04/2016   no longer tolerates cpap   Peripheral vision loss, right 2006   Persistent atrial fibrillation ([place])    a. Dx 12/2012, s/p TEE/DCCV 01/26/13. b. On Xarelto (CHA2DS2VASc = 3);  c. 03/2015 TEE (EF 15-20%, no LAA thrombus) and DCCV - amio increased to 200 mg bid.   Pneumonia    Urinary hesitancy due to benign prostatic hypertrophy     Past Surgical History:  Procedure Laterality Date   ATRIAL FIBRILLATION ABLATION N/A 11/03/2019   Procedure: ATRIAL FIBRILLATION ABLATION;  Surgeon: Thompson Grayer, MD;  Location: [facility];  Service: Cardiovascular;  Laterality: N/A;   BACK SURGERY     upper back, cannot turn neck well   [place] N/A 01/26/2013   Procedure: CARDIOVERSION;  Surgeon: Lelon Perla, MD;  Location: [facility];  Service: Cardiovascular;  Laterality: N/A;   CARDIOVERSION N/A 03/23/2015   Procedure: CARDIOVERSION;  Surgeon: Jerline Pain, MD;  Location: [facility];  Service: Cardiovascular;  Laterality: N/A;   CARDIOVERSION N/A 08/14/2017   Procedure: CARDIOVERSION;  Surgeon: Josue Hector, MD;  Location: [facility];  Service: Cardiovascular;  Laterality: N/A;   CATARACT EXTRACTION Right ~ 2006   COLONOSCOPY WITH PROPOFOL N/A 02/10/2015   Procedure: COLONOSCOPY WITH PROPOFOL;  Surgeon: Gatha Mayer, MD;  Location: [facility];  Service: Endoscopy;  Laterality: N/A;   COLONOSCOPY WITH PROPOFOL N/A 08/07/2016   Procedure: COLONOSCOPY WITH PROPOFOL;  Surgeon: Gatha Mayer, MD;  Location: [facility];  Service: Endoscopy;  Laterality: N/A;   ENTEROSCOPY N/A  08/17/2015   Procedure: ENTEROSCOPY;  Surgeon: Gatha Mayer, MD;  Location: [facility];  Service: Endoscopy;  Laterality: N/A;   EP IMPLANTABLE DEVICE N/A 03/08/2015   MDT Hillery Aldo CRT-D for nonischemic CM by Dr Rayann Heman for primary prevention   GLAUCOMA SURGERY Right ~ 2006   "put 3 stents in to drain fluid" (03/08/2015) not successful, sent to [facility] to try to get last stent out   HOT HEMOSTASIS N/A 08/07/2016   Procedure: HOT HEMOSTASIS (ARGON PLASMA COAGULATION/BICAP);  Surgeon: Gatha Mayer, MD;  Location: [facility];  Service: Endoscopy;  Laterality: N/A;   INCISION AND DRAINAGE ABSCESS POSTERIOR CERVICALSPINE  05/2012   JOINT REPLACEMENT     MELANOMA EXCISION Left 2015   "lower leg; right at my knee"   REFRACTIVE SURGERY Right ~ 2006 X 2   "twice; both done at [facility]" (03/08/2015   SURGERY SCROTAL / TESTICULAR Right 1990's   TEE WITHOUT CARDIOVERSION N/A 01/26/2013   Procedure: TRANSESOPHAGEAL ECHOCARDIOGRAM (TEE);  Surgeon: Lelon Perla, MD;  Location: [facility];  Service: Cardiovascular;  Laterality: N/A;  [name] anes. /    TEE WITHOUT CARDIOVERSION N/A 10/05/2014   Procedure: TRANSESOPHAGEAL ECHOCARDIOGRAM (  TEE)  with cardioversion;  Surgeon: Thayer Headings, MD;  Location: [facility];  Service: Cardiovascular;  Laterality: N/A;  12:52 synched cardioversion at 120 joules,...afib to SR...12 lead EKG ordered.Marland KitchenMarland KitchenCardiozem d/c'ed per MD verbal order at SR   TEE WITHOUT CARDIOVERSION N/A 03/23/2015   Procedure: TRANSESOPHAGEAL ECHOCARDIOGRAM (TEE);  Surgeon: Jerline Pain, MD;  Location: [place];  Service: Cardiovascular;  Laterality: N/A;   TEE WITHOUT CARDIOVERSION N/A 11/02/2019   Procedure: TRANSESOPHAGEAL ECHOCARDIOGRAM (TEE);  Surgeon: Josue Hector, MD;  Location: [facility];  Service: Cardiovascular;  Laterality: N/A;   THORACIC SPINE SURGERY  03/2000   "ground calcium deposits from upper thoracic" (01/26/2013)   THYROIDECTOMY N/A 05/11/2021   Procedure: TOTAL THYROIDECTOMY;   Surgeon: Armandina Gemma, MD;  Location: [facility];  Service: General;  Laterality: N/A;   TOTAL HIP ARTHROPLASTY Right 06/2007     reports that he quit smoking about 22 years ago. His smoking use included cigarettes. He has a 144.00 pack-year smoking history. He has never used smokeless tobacco. He reports that he does not drink alcohol and does not use drugs.  Allergies  Allergen Reactions   Pravastatin Sodium Other (See Comments)    Joint and muscle pain   Azithromycin Diarrhea    Family History  Problem Relation Age of Onset   Kidney disease Mother    Heart disease Mother        MI, open heart   Diabetes Mother        dialysis   Leukemia Father    Colon cancer Paternal Uncle    Lung cancer Paternal Uncle        x 2   Prostate cancer Paternal Uncle    Diabetes Maternal Grandmother    Heart attack Maternal Uncle    Diabetes Maternal Aunt        x 3   Diabetes Maternal Uncle    Thyroid disease Sister     Prior to Admission medications   Medication Sig Start Date End Date Taking? Authorizing Provider  calcitRIOL (ROCALTROL) 0.25 MCG capsule Take 1 capsule by mouth once a week. 11/02/21 11/02/22  [provider]  calcium carbonate (TUMS - DOSED IN MG ELEMENTAL CALCIUM) 500 MG chewable tablet Chew 1 tablet by mouth daily.    [provider]  carvedilol (COREG) 12.5 MG tablet Take 1 tablet (12.5 mg total) by mouth 2 (two) times daily. 04/13/22   Larey Dresser, MD  dorzolamide-timolol (COSOPT) 22.3-6.8 MG/ML ophthalmic solution Place 1 drop into the right eye 2 (two) times daily.    [provider]  empagliflozin (JARDIANCE) 10 MG TABS tablet Take by mouth. 01/20/20   [provider]  escitalopram (LEXAPRO) 20 MG tablet Take 1 tablet (20 mg total) by mouth daily. 06/23/19   Bensimhon, Shaune Pascal, MD  ferrous sulfate 325 (65 FE) MG tablet Take 325 mg by mouth daily with breakfast.    [provider]  finasteride (PROSCAR) 5 MG tablet Take 1 tablet  (5 mg total) by mouth daily. 06/22/19   Bensimhon, Shaune Pascal, MD  furosemide (LASIX) 40 MG tablet TAKE 1 TABLET EVERY MORNING & 1/2 TABLET EVERY EVENING 02/12/22   Larey Dresser, MD  gabapentin (NEURONTIN) 100 MG capsule Take 200 mg by mouth 2 (two) times daily.     [provider]  latanoprost (XALATAN) 0.005 % ophthalmic solution Place 1 drop into the right eye at bedtime.    [provider]  levothyroxine (SYNTHROID) 125 MCG tablet Take  1 tablet (125 mcg total) by mouth daily. 08/02/22   Elayne Snare, MD  nitroGLYCERIN (NITROSTAT) 0.4 MG SL tablet Place 0.4 mg under the tongue every 5 (five) minutes as needed for chest pain. 12/11/17   [provider]  Polyethyl Glycol-Propyl Glycol 0.4-0.3 % SOLN Place 1 drop into the left eye in the morning and at bedtime.    [provider]  REPATHA SURECLICK 947 MG/ML SOAJ Inject 1 pen into the skin every 14 (fourteen) days. 11/16/21   Larey Dresser, MD  RHOPRESSA 0.02 % SOLN Place 1 drop into the right eye at bedtime. 10/11/21   [provider]  sacubitril-valsartan (ENTRESTO) 24-26 MG Take 0.5 tablets by mouth 2 (two) times daily. 11/03/21   Larey Dresser, MD  spironolactone (ALDACTONE) 25 MG tablet Take 25 mg by mouth daily. Pt takes 1/2 table 12.5 mg in the morning and 1/2 table 12.5 mg at night.    [provider]  tamsulosin (FLOMAX) 0.4 MG CAPS Take 0.4 mg by mouth at bedtime.     [provider]  VITAMIN D PO Take 1 tablet by mouth daily.    [provider]  XARELTO 15 MG TABS tablet TAKE (1) TABLET DAILY WITH SUPPER. 08/30/22   Larey Dresser, MD    Physical Exam: Vitals:   10/10/22 1345 10/10/22 1430 10/10/22 1545 10/10/22 1713  BP: 118/65 116/80 (!) 108/93 113/77  Pulse: 65 (!) 58 63 63  Resp: '19 17 20 20  '$ Temp:   98.2 F (36.8 C) (!) 97.5 F (36.4 C)  TempSrc:    Oral  SpO2: 100% 98% 99% 97%    Constitutional: NAD, calm, comfortable Vitals:   10/10/22 1345  10/10/22 1430 10/10/22 1545 10/10/22 1713  BP: 118/65 116/80 (!) 108/93 113/77  Pulse: 65 (!) 58 63 63  Resp: '19 17 20 20  '$ Temp:   98.2 F (36.8 C) (!) 97.5 F (36.4 C)  TempSrc:    Oral  SpO2: 100% 98% 99% 97%   General:  Pleasantly resting in bed, No acute distress. HEENT:  Normocephalic atraumatic.  Sclerae nonicteric, noninjected.  Extraocular movements intact bilaterally. Neck:  Without mass or deformity.  Trachea is midline. Lungs:  Clear to auscultate bilaterally without rhonchi, wheeze, or rales. Heart:  Regular rate and rhythm.  Without murmurs, rubs, or gallops. Abdomen:  Soft, nontender, nondistended.  Without guarding or rebound. Extremities: Without cyanosis, clubbing, edema, or obvious deformity. Vascular:  Dorsalis pedis and posterior tibial pulses palpable bilaterally. Skin:  Warm and dry, no erythema, no ulcerations.  Labs on Admission: I have personally reviewed following labs and imaging studies  CBC: Recent Labs  Lab 10/10/22 1324 10/10/22 1333  WBC 7.5  --   HGB 11.5* 11.9*  HCT 36.1* 35.0*  MCV 104.0*  --   PLT 144*  --    Basic Metabolic Panel: Recent Labs  Lab 10/10/22 1324 10/10/22 1333  NA 132* 137  K 4.3 4.1  CL 98 99  CO2 25  --   GLUCOSE 171* 159*  BUN 38* 36*  CREATININE 2.35* 2.50*  CALCIUM 9.0  --    GFR: Estimated Creatinine Clearance: 25.5 mL/min (A) (by C-G formula based on SCr of 2.5 mg/dL (H)). Liver Function Tests: Recent Labs  Lab 10/10/22 1324  AST 17  ALT 21  ALKPHOS 52  BILITOT 0.4  PROT 6.8  ALBUMIN 3.6   No results for input(s): "LIPASE", "AMYLASE" in the last 168 hours. No results for  input(s): "AMMONIA" in the last 168 hours. Coagulation Profile: Recent Labs  Lab 10/10/22 1324  INR 1.7*   Cardiac Enzymes: No results for input(s): "CKTOTAL", "CKMB", "CKMBINDEX", "TROPONINI" in the last 168 hours. BNP (last 3 results) No results for input(s): "PROBNP" in the last 8760 hours. HbA1C: No results for  input(s): "HGBA1C" in the last 72 hours. CBG: No results for input(s): "GLUCAP" in the last 168 hours. Lipid Profile: No results for input(s): "CHOL", "HDL", "LDLCALC", "TRIG", "CHOLHDL", "LDLDIRECT" in the last 72 hours. Thyroid Function Tests: No results for input(s): "TSH", "T4TOTAL", "FREET4", "T3FREE", "THYROIDAB" in the last 72 hours. Anemia Panel: No results for input(s): "VITAMINB12", "FOLATE", "FERRITIN", "TIBC", "IRON", "RETICCTPCT" in the last 72 hours. Urine analysis:    Component Value Date/Time   COLORURINE YELLOW 03/21/2015 0759   APPEARANCEUR CLEAR 03/21/2015 0759   LABSPEC 1.019 03/21/2015 0759   PHURINE 6.5 03/21/2015 0759   GLUCOSEU NEGATIVE 03/21/2015 0759   HGBUR NEGATIVE 03/21/2015 0759   BILIRUBINUR NEGATIVE 03/21/2015 0759   KETONESUR NEGATIVE 03/21/2015 0759   PROTEINUR NEGATIVE 03/21/2015 0759   UROBILINOGEN 1.0 03/21/2015 0759   NITRITE NEGATIVE 03/21/2015 0759   LEUKOCYTESUR NEGATIVE 03/21/2015 0759    Radiological Exams on Admission: CT Lumbar Spine Wo Contrast  Result Date: 10/10/2022 CLINICAL DATA:  Low back pain, no red flags, no prior management EXAM: CT LUMBAR SPINE WITHOUT CONTRAST TECHNIQUE: Multidetector CT imaging of the lumbar spine was performed without intravenous contrast administration. Multiplanar CT image reconstructions were also generated. RADIATION DOSE REDUCTION: This exam was performed according to the departmental dose-optimization program which includes automated exposure control, adjustment of the mA and/or kV according to patient size and/or use of iterative reconstruction technique. COMPARISON:  CT 07/15/2018 FINDINGS: Segmentation: Transitional lumbosacral vertebrae with lumbarized S1 rudimentary disc at S1-S2. Alignment: Slight dextroconvex curvature. Mild degenerative retrolisthesis at L2-L3 and L3-L4. Trace degenerative anterolisthesis at L5-S1. Vertebrae: There is bridging anterior osteophyte formation in the lower thoracic  spine and bulky non bridging osteophytes in the lumbar spine. There is bridging ossification along the posterior elements and multilevel PLL and ligamentum flavum ossification. Nondisplaced transverse fracture of the S5 vertebrae near the sacrococcygeal junction (series 9 images 44-49). No evidence of lumbar spine fracture. Partially visualized right hip arthroplasty. Ossification of the iliolumbar ligaments Paraspinal and other soft tissues: Cholelithiasis. There is a simple exophytic left renal cyst arising from the posterior kidney measuring up to 5.5 cm, which requires no follow-up imaging. Aortoiliac atherosclerosis. Extensive colonic diverticulosis. Disc levels: There is multilevel ligament flavum and posterior longitudinal ligament ossification, multilevel degenerative disc disease, and severe multilevel facet arthropathy resulting in varying degrees of spinal canal neural foraminal stenosis. Of note, there appears to be severe spinal canal stenosis at L2-L3 related to bony hypertrophy and at least moderate spinal canal stenosis at L4-L5 and L5-S1. There is moderate-severe multilevel neural foraminal stenosis. IMPRESSION: Transitional lumbosacral vertebrae with lumbarized S1 and rudimentary disc at S1-S2. Nondisplaced transverse fracture of the S5 vertebrae near the sacrococcygeal junction. No evidence of lumbar spine fracture. Diffuse idiopathic skeletal hyperostosis, with multilevel ligament flavum and PLL ossification, multilevel degenerative disc disease, and severe multilevel facet arthropathy resulting in varying degrees of spinal canal and neural foraminal stenosis, as described above. Electronically Signed   By: Maurine Simmering M.D.   On: 10/10/2022 14:40   CT HEAD WO CONTRAST  Result Date: 10/10/2022 CLINICAL DATA:  Fall. EXAM: CT HEAD WITHOUT CONTRAST CT CERVICAL SPINE WITHOUT CONTRAST TECHNIQUE: Multidetector CT imaging of the head and  cervical spine was performed following the standard protocol  without intravenous contrast. Multiplanar CT image reconstructions of the cervical spine were also generated. RADIATION DOSE REDUCTION: This exam was performed according to the departmental dose-optimization program which includes automated exposure control, adjustment of the mA and/or kV according to patient size and/or use of iterative reconstruction technique. COMPARISON:  None Available. FINDINGS: CT HEAD FINDINGS Brain: No evidence of acute infarction, hemorrhage, hydrocephalus, extra-axial collection or mass lesion/mass effect. Sequela moderate chronic microvascular ischemic change. Vascular: No hyperdense vessel or unexpected calcification. Skull: Normal. Negative for fracture or focal lesion. Sinuses/Orbits: Bilateral lens replacement. Trace mucosal thickening bilateral ethmoid sinuses and maxillary sinuses. No middle ear or mastoid effusion. Other: None. CT CERVICAL SPINE FINDINGS Alignment: Straightening of the normal cervical lordosis. Skull base and vertebrae: No acute fracture. No primary bone lesion or focal pathologic process. Soft tissues and spinal canal: No prevertebral fluid or swelling. No visible canal hematoma. Disc levels: There are findings of DISH with extensive OPLL. There is severe spinal canal stenosis at T2-T3 due to OPLL. There is likely also moderate spinal canal stenosis at the C2 and C2-C3 level secondary to OPLL. Upper chest: Negative. Other: None IMPRESSION: 1. No acute intracranial abnormality. 2. No acute fracture or traumatic malalignment of the cervical spine. 3. Findings of DISH with extensive OPLL. There is severe spinal canal stenosis at T2-T3 due to OPLL. There is likely also moderate spinal canal stenosis at the C2 and C2-C3 level secondary to OPLL. In the setting of trauma, if there is any concern for myelopathic symptoms, further evaluation with a cervical spine MRI is recommended to exclude the possibility of cord injury. Electronically Signed   By: Marin Roberts M.D.    On: 10/10/2022 14:17   CT CERVICAL SPINE WO CONTRAST  Result Date: 10/10/2022 CLINICAL DATA:  Fall. EXAM: CT HEAD WITHOUT CONTRAST CT CERVICAL SPINE WITHOUT CONTRAST TECHNIQUE: Multidetector CT imaging of the head and cervical spine was performed following the standard protocol without intravenous contrast. Multiplanar CT image reconstructions of the cervical spine were also generated. RADIATION DOSE REDUCTION: This exam was performed according to the departmental dose-optimization program which includes automated exposure control, adjustment of the mA and/or kV according to patient size and/or use of iterative reconstruction technique. COMPARISON:  None Available. FINDINGS: CT HEAD FINDINGS Brain: No evidence of acute infarction, hemorrhage, hydrocephalus, extra-axial collection or mass lesion/mass effect. Sequela moderate chronic microvascular ischemic change. Vascular: No hyperdense vessel or unexpected calcification. Skull: Normal. Negative for fracture or focal lesion. Sinuses/Orbits: Bilateral lens replacement. Trace mucosal thickening bilateral ethmoid sinuses and maxillary sinuses. No middle ear or mastoid effusion. Other: None. CT CERVICAL SPINE FINDINGS Alignment: Straightening of the normal cervical lordosis. Skull base and vertebrae: No acute fracture. No primary bone lesion or focal pathologic process. Soft tissues and spinal canal: No prevertebral fluid or swelling. No visible canal hematoma. Disc levels: There are findings of DISH with extensive OPLL. There is severe spinal canal stenosis at T2-T3 due to OPLL. There is likely also moderate spinal canal stenosis at the C2 and C2-C3 level secondary to OPLL. Upper chest: Negative. Other: None IMPRESSION: 1. No acute intracranial abnormality. 2. No acute fracture or traumatic malalignment of the cervical spine. 3. Findings of DISH with extensive OPLL. There is severe spinal canal stenosis at T2-T3 due to OPLL. There is likely also moderate spinal  canal stenosis at the C2 and C2-C3 level secondary to OPLL. In the setting of trauma, if there is any concern for  myelopathic symptoms, further evaluation with a cervical spine MRI is recommended to exclude the possibility of cord injury. Electronically Signed   By: Marin Roberts M.D.   On: 10/10/2022 14:17   DG Pelvis Portable  Result Date: 10/10/2022 CLINICAL DATA:  Fall.  Back pain. EXAM: PORTABLE PELVIS 1-2 VIEWS COMPARISON:  PET-CT 12/07/2020.  Pelvic CT 07/15/2018. FINDINGS: 1328 hours. Status post right hip bipolar hemiarthroplasty. The visualized hardware appears intact without loosening. The distal end of the femoral prosthesis is not imaged. There is no evidence of acute fracture or dislocation. Moderate degenerative changes are present at the left hip. Fragmented spurring of the right greater trochanter appears grossly unchanged. IMPRESSION: No evidence of acute pelvic fracture or dislocation. Status post right hip bipolar hemiarthroplasty. Electronically Signed   By: Richardean Sale M.D.   On: 10/10/2022 13:45   DG Chest Port 1 View  Result Date: 10/10/2022 CLINICAL DATA:  Syncope. EXAM: PORTABLE CHEST 1 VIEW COMPARISON:  05/01/2021 and CT chest 06/11/2022. FINDINGS: Trachea is midline. Heart is enlarged, stable. Pacemaker lead tips are in the right atrium and coronary sinus. ICD lead tip is in the right ventricle. Lungs are clear. No pleural fluid. No pneumothorax. Osseous structures appear grossly intact. Old right rib fractures. IMPRESSION: No acute findings. Electronically Signed   By: Lorin Picket M.D.   On: 10/10/2022 13:45    EKG: PENDING   Little Ishikawa DO [facility] For contact please use secure messenger on Epic  If 7PM-7AM, please contact night-coverage located on [email or web address]   10/10/2022, 5:38 PM

## 2022-10-10 NOTE — ED Notes (Signed)
Pt wife at bedside. Pt denies any needs at this time.

## 2022-10-10 NOTE — Progress Notes (Signed)
Orthopedic Tech Progress Note Patient Details:  EATHAN GROMAN [birth year] [id number]  Level 2 trauma   Patient ID: Chad Brown, male   DOB: 01-Jul-1941, 82 y.o.   MRN: [id number]  Janit Pagan 10/10/2022, 3:02 PM

## 2022-10-10 NOTE — Progress Notes (Signed)
[facility] was called twice for MD to see patient. Aileen Fass MD was called but she replied she is not assigned to the patient. Dr. Sophronia Simas was paged concerning orders that EPIC will be down in 30 mins. Charge RN was made aware of situation. Awaiting response from MD.

## 2022-10-10 NOTE — ED Notes (Signed)
ED TO INPATIENT HANDOFF REPORT  ED Nurse Name and Phone #:   Lorenda Ishihara [phone]   S Name/Age/Gender Chad Brown 82 y.o. male Room/Bed: 001C/001C  Code Status   Code Status: Full Code  Home/SNF/Other   Patient oriented to: self, place, time, and situation Is this baseline? Yes   Triage Complete: Triage complete  Chief Complaint Hypotension [I95.9]  Triage Note Pt passed out at gym today approx 1 hour pta. Witnessed fall and hit head. Pt is on blood thinners. Pt complains of back pain but no head pain.  PA in to assess at 1317   Allergies Allergies  Allergen Reactions   Pravastatin Sodium Other (See Comments)    Joint and muscle pain   Azithromycin Diarrhea    Level of Care/Admitting Diagnosis ED Disposition     ED Disposition  Admit   Condition  --   [place]: [place] [[id number]]  Level of Care: Telemetry Medical [104]  May place patient in observation at [facility] or [place] if equivalent level of care is available:: No  Covid Evaluation: Confirmed COVID Negative  Diagnosis: Hypotension [809983]  Admitting Physician: Little Ishikawa [[id number]]  Attending Physician: Little Ishikawa [[id number]]          B Medical/Surgery History Past Medical History:  Diagnosis Date   Adenomatous colon polyp    tubular   AICD (automatic cardioverter/defibrillator) present 03/08/2015   MDT CRTD dual pacemaker and defib   Anemia    iron deficient   Arthritis    "about all my joints; hands, knees, back" (03/08/2015)   Atherosclerosis    Cataract    left eye small   Cholelithiasis    gallstones   Chronic systolic CHF (congestive heart failure) ([place])    a. New dx 12/2012 ? NICM, may be r/t afib. b. Nuc 03/2013 - normal;  c. 03/2015 TEE EF 15-20%.   Colon polyp, hyperplastic 01/2015   removed precancerous lesions   Depression    Diverticulosis    Dysrhythmia    afib   GERD (gastroesophageal reflux disease)    Glaucoma     right eye   Hyperlipidemia    Hypertension    Melanoma of eye ([place]) 2000's   "right; it's never been biopsied"   Melanoma of lower leg ([place]) 2015   "left; right at my knee"   Myocardial infarction ([place]) 1998   OSA (obstructive sleep apnea) 01/04/2016   no longer tolerates cpap   Peripheral vision loss, right 2006   Persistent atrial fibrillation ([place])    a. Dx 12/2012, s/p TEE/DCCV 01/26/13. b. On Xarelto (CHA2DS2VASc = 3);  c. 03/2015 TEE (EF 15-20%, no LAA thrombus) and DCCV - amio increased to 200 mg bid.   Pneumonia    Urinary hesitancy due to benign prostatic hypertrophy    Past Surgical History:  Procedure Laterality Date   ATRIAL FIBRILLATION ABLATION N/A 11/03/2019   Procedure: ATRIAL FIBRILLATION ABLATION;  Surgeon: Thompson Grayer, MD;  Location: [place] [facility];  Service: Cardiovascular;  Laterality: N/A;   BACK SURGERY     upper back, cannot turn neck well   [place] N/A 01/26/2013   Procedure: CARDIOVERSION;  Surgeon: Lelon Perla, MD;  Location: [place];  Service: Cardiovascular;  Laterality: N/A;   CARDIOVERSION N/A 03/23/2015   Procedure: CARDIOVERSION;  Surgeon: Jerline Pain, MD;  Location: [place];  Service: Cardiovascular;  Laterality: N/A;   CARDIOVERSION N/A  08/14/2017   Procedure: CARDIOVERSION;  Surgeon: Josue Hector, MD;  Location: [facility] ENDOSCOPY;  Service: Cardiovascular;  Laterality: N/A;   CATARACT EXTRACTION Right ~ 2006   COLONOSCOPY WITH PROPOFOL N/A 02/10/2015   Procedure: COLONOSCOPY WITH PROPOFOL;  Surgeon: Gatha Mayer, MD;  Location: [facility] ENDOSCOPY;  Service: Endoscopy;  Laterality: N/A;   COLONOSCOPY WITH PROPOFOL N/A 08/07/2016   Procedure: COLONOSCOPY WITH PROPOFOL;  Surgeon: Gatha Mayer, MD;  Location: [facility] ENDOSCOPY;  Service: Endoscopy;  Laterality: N/A;   ENTEROSCOPY N/A 08/17/2015   Procedure: ENTEROSCOPY;  Surgeon: Gatha Mayer, MD;  Location: [facility] ENDOSCOPY;  Service: Endoscopy;   Laterality: N/A;   EP IMPLANTABLE DEVICE N/A 03/08/2015   MDT Hillery Aldo CRT-D for nonischemic CM by Dr Rayann Heman for primary prevention   GLAUCOMA SURGERY Right ~ 2006   "put 3 stents in to drain fluid" (03/08/2015) not successful, sent to [facility] to try to get last stent out   HOT HEMOSTASIS N/A 08/07/2016   Procedure: HOT HEMOSTASIS (ARGON PLASMA COAGULATION/BICAP);  Surgeon: Gatha Mayer, MD;  Location: [facility];  Service: Endoscopy;  Laterality: N/A;   INCISION AND DRAINAGE ABSCESS POSTERIOR CERVICALSPINE  05/2012   JOINT REPLACEMENT     MELANOMA EXCISION Left 2015   "lower leg; right at my knee"   REFRACTIVE SURGERY Right ~ 2006 X 2   "twice; both done at [facility]" (03/08/2015   SURGERY SCROTAL / TESTICULAR Right 1990's   TEE WITHOUT CARDIOVERSION N/A 01/26/2013   Procedure: TRANSESOPHAGEAL ECHOCARDIOGRAM (TEE);  Surgeon: Lelon Perla, MD;  Location: [facility] ENDOSCOPY;  Service: Cardiovascular;  Laterality: N/A;  [name] anes. /    TEE WITHOUT CARDIOVERSION N/A 10/05/2014   Procedure: TRANSESOPHAGEAL ECHOCARDIOGRAM (TEE)  with cardioversion;  Surgeon: Thayer Headings, MD;  Location: [facility] ENDOSCOPY;  Service: Cardiovascular;  Laterality: N/A;  12:52 synched cardioversion at 120 joules,...afib to SR...12 lead EKG ordered.Marland KitchenMarland KitchenCardiozem d/c'ed per MD verbal order at SR   TEE WITHOUT CARDIOVERSION N/A 03/23/2015   Procedure: TRANSESOPHAGEAL ECHOCARDIOGRAM (TEE);  Surgeon: Jerline Pain, MD;  Location: [place];  Service: Cardiovascular;  Laterality: N/A;   TEE WITHOUT CARDIOVERSION N/A 11/02/2019   Procedure: TRANSESOPHAGEAL ECHOCARDIOGRAM (TEE);  Surgeon: Josue Hector, MD;  Location: [facility] ENDOSCOPY;  Service: Cardiovascular;  Laterality: N/A;   THORACIC SPINE SURGERY  03/2000   "ground calcium deposits from upper thoracic" (01/26/2013)   THYROIDECTOMY N/A 05/11/2021   Procedure: TOTAL THYROIDECTOMY;  Surgeon: Armandina Gemma, MD;  Location: [facility] ORS;  Service: General;  Laterality: N/A;   TOTAL HIP ARTHROPLASTY Right  06/2007     A IV Location/Drains/Wounds Patient Lines/Drains/Airways Status     Active Line/Drains/Airways     Name Placement date Placement time Site Days   Peripheral IV 10/10/22 Right Antecubital 10/10/22  1330  Antecubital  less than 1   Incision (Closed) 05/11/21 Neck Other (Comment) 05/11/21  1003  -- 517            Intake/Output Last 24 hours No intake or output data in the 24 hours ending 10/10/22 1953  Labs/Imaging Results for orders placed or performed during the hospital encounter of 10/10/22 (from the past 48 hour(s))  Lactic acid, plasma     Status: None   Collection Time: 10/10/22  3:24 AM  Result Value Ref Range   Lactic Acid, Venous 1.8 0.5 - 1.9 mmol/L    Comment: Performed at [facility], [place] [address] [place], [place] [address]  Comprehensive metabolic panel  Status: Abnormal   Collection Time: 10/10/22  1:24 PM  Result Value Ref Range   Sodium 132 (L) 135 - 145 mmol/L   Potassium 4.3 3.5 - 5.1 mmol/L   Chloride 98 98 - 111 mmol/L   CO2 25 22 - 32 mmol/L   Glucose, Bld 171 (H) 70 - 99 mg/dL    Comment: Glucose reference range applies only to samples taken after fasting for at least 8 hours.   BUN 38 (H) 8 - 23 mg/dL   Creatinine, Ser 2.35 (H) 0.61 - 1.24 mg/dL   Calcium 9.0 8.9 - 10.3 mg/dL   Total Protein 6.8 6.5 - 8.1 g/dL   Albumin 3.6 3.5 - 5.0 g/dL   AST 17 15 - 41 U/L   ALT 21 0 - 44 U/L   Alkaline Phosphatase 52 38 - 126 U/L   Total Bilirubin 0.4 0.3 - 1.2 mg/dL   GFR, Estimated 27 (L) >60 mL/min    Comment: (NOTE) Calculated using the CKD-EPI Creatinine Equation (2021)    Anion gap 9 5 - 15    Comment: Performed at [facility] [address], [place], [place] [address]  CBC     Status: Abnormal   Collection Time: 10/10/22  1:24 PM  Result Value Ref Range   WBC 7.5 4.0 - 10.5 K/uL   RBC 3.47 (L) 4.22 - 5.81 MIL/uL   Hemoglobin 11.5 (L) 13.0 - 17.0 g/dL   HCT 36.1 (L) 39.0 - 52.0 %   MCV 104.0 (H) 80.0 - 100.0 fL    MCH 33.1 26.0 - 34.0 pg   MCHC 31.9 30.0 - 36.0 g/dL   RDW 14.2 11.5 - 15.5 %   Platelets 144 (L) 150 - 400 K/uL   nRBC 0.0 0.0 - 0.2 %    Comment: Performed at [facility], [place] [address] [place], [place] [address]  Ethanol     Status: None   Collection Time: 10/10/22  1:24 PM  Result Value Ref Range   Alcohol, Ethyl (B) <10 <10 mg/dL    Comment: (NOTE) Lowest detectable limit for serum alcohol is 10 mg/dL.  For medical purposes only. Performed at [facility], [place] [address], [place], [place] [address]   Protime-INR     Status: Abnormal   Collection Time: 10/10/22  1:24 PM  Result Value Ref Range   Prothrombin Time 20.0 (H) 11.4 - 15.2 seconds   INR 1.7 (H) 0.8 - 1.2    Comment: (NOTE) INR goal varies based on device and disease states. Performed at [facility], [place] [address] [place], [place] [address]   Sample to Blood Bank     Status: None   Collection Time: 10/10/22  1:24 PM  Result Value Ref Range   Blood Bank Specimen SAMPLE AVAILABLE FOR TESTING    Sample Expiration      10/11/2022,2359 Performed at [facility], [place] [address] [place],  [address]   Troponin I (High Sensitivity)     Status: None   Collection Time: 10/10/22  1:24 PM  Result Value Ref Range   Troponin I (High Sensitivity) 12 <18 ng/L    Comment: (NOTE) Elevated high sensitivity troponin I (hsTnI) values and significant  changes across serial measurements may suggest ACS but many other  chronic and acute conditions are known to elevate hsTnI results.  Refer to the "Links" section for chest pain algorithms and additional  guidance. Performed at [facility], [address] [place], [place] [address]   I-Stat Chem 8, ED     Status: Abnormal   Collection Time: 10/10/22  1:33 PM  Result Value Ref Range   Sodium 137 135 - 145 mmol/L   Potassium 4.1 3.5 - 5.1 mmol/L   Chloride 99 98 - 111 mmol/L   BUN 36 (H) 8 - 23 mg/dL   Creatinine, Ser 2.50 (H)  0.61 - 1.24 mg/dL   Glucose, Bld 159 (H) 70 - 99 mg/dL    Comment: Glucose reference range applies only to samples taken after fasting for at least 8 hours.   Calcium, Ion 1.19 1.15 - 1.40 mmol/L   TCO2 26 22 - 32 mmol/L   Hemoglobin 11.9 (L) 13.0 - 17.0 g/dL   HCT 35.0 (L) 39.0 - 52.0 %  Troponin I (High Sensitivity)     Status: None   Collection Time: 10/10/22  3:40 PM  Result Value Ref Range   Troponin I (High Sensitivity) 12 <18 ng/L    Comment: (NOTE) Elevated high sensitivity troponin I (hsTnI) values and significant  changes across serial measurements may suggest ACS but many other  chronic and acute conditions are known to elevate hsTnI results.  Refer to the "Links" section for chest pain algorithms and additional  guidance. Performed at [facility], [place] [address] [place], [place] [address]   Urinalysis, Routine w reflex microscopic Urine, Clean Catch     Status: Abnormal   Collection Time: 10/10/22  5:00 PM  Result Value Ref Range   Color, Urine YELLOW YELLOW   APPearance CLEAR CLEAR   Specific Gravity, Urine 1.010 1.005 - 1.030   pH 5.0 5.0 - 8.0   Glucose, UA >=500 (A) NEGATIVE mg/dL   Hgb urine dipstick NEGATIVE NEGATIVE   Bilirubin Urine NEGATIVE NEGATIVE   Ketones, ur NEGATIVE NEGATIVE mg/dL   Protein, ur NEGATIVE NEGATIVE mg/dL   Nitrite NEGATIVE NEGATIVE   Leukocytes,Ua NEGATIVE NEGATIVE   RBC / HPF 0-5 0 - 5 RBC/hpf   WBC, UA 0-5 0 - 5 WBC/hpf   Bacteria, UA RARE (A) NONE SEEN   Squamous Epithelial / HPF 0-5 0 - 5 /HPF   Mucus PRESENT    Hyaline Casts, UA PRESENT     Comment: Performed at [facility], [address] [place], [place] [address]   CT Lumbar Spine Wo Contrast  Result Date: 10/10/2022 CLINICAL DATA:  Low back pain, no red flags, no prior management EXAM: CT LUMBAR SPINE WITHOUT CONTRAST TECHNIQUE: Multidetector CT imaging of the lumbar spine was performed without intravenous contrast administration. Multiplanar CT image  reconstructions were also generated. RADIATION DOSE REDUCTION: This exam was performed according to the departmental dose-optimization program which includes automated exposure control, adjustment of the mA and/or kV according to patient size and/or use of iterative reconstruction technique. COMPARISON:  CT 07/15/2018 FINDINGS: Segmentation: Transitional lumbosacral vertebrae with lumbarized S1 rudimentary disc at S1-S2. Alignment: Slight dextroconvex curvature. Mild degenerative retrolisthesis at L2-L3 and L3-L4. Trace degenerative anterolisthesis at L5-S1. Vertebrae: There is bridging anterior osteophyte formation in the lower thoracic spine and bulky non bridging osteophytes in the lumbar spine. There is bridging ossification along the posterior elements and multilevel PLL and ligamentum flavum ossification. Nondisplaced transverse fracture of the S5 vertebrae near the sacrococcygeal junction (series 9 images 44-49). No evidence of lumbar spine fracture. Partially visualized right hip arthroplasty. Ossification of the iliolumbar ligaments Paraspinal and other soft tissues: Cholelithiasis. There is a simple exophytic  left renal cyst arising from the posterior kidney measuring up to 5.5 cm, which requires no follow-up imaging. Aortoiliac atherosclerosis. Extensive colonic diverticulosis. Disc levels: There is multilevel ligament flavum and posterior longitudinal ligament ossification, multilevel degenerative disc disease, and severe multilevel facet arthropathy resulting in varying degrees of spinal canal neural foraminal stenosis. Of note, there appears to be severe spinal canal stenosis at L2-L3 related to bony hypertrophy and at least moderate spinal canal stenosis at L4-L5 and L5-S1. There is moderate-severe multilevel neural foraminal stenosis. IMPRESSION: Transitional lumbosacral vertebrae with lumbarized S1 and rudimentary disc at S1-S2. Nondisplaced transverse fracture of the S5 vertebrae near the  sacrococcygeal junction. No evidence of lumbar spine fracture. Diffuse idiopathic skeletal hyperostosis, with multilevel ligament flavum and PLL ossification, multilevel degenerative disc disease, and severe multilevel facet arthropathy resulting in varying degrees of spinal canal and neural foraminal stenosis, as described above. Electronically Signed   By: Maurine Simmering M.D.   On: 10/10/2022 14:40   CT HEAD WO CONTRAST  Result Date: 10/10/2022 CLINICAL DATA:  Fall. EXAM: CT HEAD WITHOUT CONTRAST CT CERVICAL SPINE WITHOUT CONTRAST TECHNIQUE: Multidetector CT imaging of the head and cervical spine was performed following the standard protocol without intravenous contrast. Multiplanar CT image reconstructions of the cervical spine were also generated. RADIATION DOSE REDUCTION: This exam was performed according to the departmental dose-optimization program which includes automated exposure control, adjustment of the mA and/or kV according to patient size and/or use of iterative reconstruction technique. COMPARISON:  None Available. FINDINGS: CT HEAD FINDINGS Brain: No evidence of acute infarction, hemorrhage, hydrocephalus, extra-axial collection or mass lesion/mass effect. Sequela moderate chronic microvascular ischemic change. Vascular: No hyperdense vessel or unexpected calcification. Skull: Normal. Negative for fracture or focal lesion. Sinuses/Orbits: Bilateral lens replacement. Trace mucosal thickening bilateral ethmoid sinuses and maxillary sinuses. No middle ear or mastoid effusion. Other: None. CT CERVICAL SPINE FINDINGS Alignment: Straightening of the normal cervical lordosis. Skull base and vertebrae: No acute fracture. No primary bone lesion or focal pathologic process. Soft tissues and spinal canal: No prevertebral fluid or swelling. No visible canal hematoma. Disc levels: There are findings of DISH with extensive OPLL. There is severe spinal canal stenosis at T2-T3 due to OPLL. There is likely also  moderate spinal canal stenosis at the C2 and C2-C3 level secondary to OPLL. Upper chest: Negative. Other: None IMPRESSION: 1. No acute intracranial abnormality. 2. No acute fracture or traumatic malalignment of the cervical spine. 3. Findings of DISH with extensive OPLL. There is severe spinal canal stenosis at T2-T3 due to OPLL. There is likely also moderate spinal canal stenosis at the C2 and C2-C3 level secondary to OPLL. In the setting of trauma, if there is any concern for myelopathic symptoms, further evaluation with a cervical spine MRI is recommended to exclude the possibility of cord injury. Electronically Signed   By: Marin Roberts M.D.   On: 10/10/2022 14:17   CT CERVICAL SPINE WO CONTRAST  Result Date: 10/10/2022 CLINICAL DATA:  Fall. EXAM: CT HEAD WITHOUT CONTRAST CT CERVICAL SPINE WITHOUT CONTRAST TECHNIQUE: Multidetector CT imaging of the head and cervical spine was performed following the standard protocol without intravenous contrast. Multiplanar CT image reconstructions of the cervical spine were also generated. RADIATION DOSE REDUCTION: This exam was performed according to the departmental dose-optimization program which includes automated exposure control, adjustment of the mA and/or kV according to patient size and/or use of iterative reconstruction technique. COMPARISON:  None Available. FINDINGS: CT HEAD FINDINGS Brain: No evidence of acute infarction,  hemorrhage, hydrocephalus, extra-axial collection or mass lesion/mass effect. Sequela moderate chronic microvascular ischemic change. Vascular: No hyperdense vessel or unexpected calcification. Skull: Normal. Negative for fracture or focal lesion. Sinuses/Orbits: Bilateral lens replacement. Trace mucosal thickening bilateral ethmoid sinuses and maxillary sinuses. No middle ear or mastoid effusion. Other: None. CT CERVICAL SPINE FINDINGS Alignment: Straightening of the normal cervical lordosis. Skull base and vertebrae: No acute fracture. No  primary bone lesion or focal pathologic process. Soft tissues and spinal canal: No prevertebral fluid or swelling. No visible canal hematoma. Disc levels: There are findings of DISH with extensive OPLL. There is severe spinal canal stenosis at T2-T3 due to OPLL. There is likely also moderate spinal canal stenosis at the C2 and C2-C3 level secondary to OPLL. Upper chest: Negative. Other: None IMPRESSION: 1. No acute intracranial abnormality. 2. No acute fracture or traumatic malalignment of the cervical spine. 3. Findings of DISH with extensive OPLL. There is severe spinal canal stenosis at T2-T3 due to OPLL. There is likely also moderate spinal canal stenosis at the C2 and C2-C3 level secondary to OPLL. In the setting of trauma, if there is any concern for myelopathic symptoms, further evaluation with a cervical spine MRI is recommended to exclude the possibility of cord injury. Electronically Signed   By: Marin Roberts M.D.   On: 10/10/2022 14:17   DG Pelvis Portable  Result Date: 10/10/2022 CLINICAL DATA:  Fall.  Back pain. EXAM: PORTABLE PELVIS 1-2 VIEWS COMPARISON:  PET-CT 12/07/2020.  Pelvic CT 07/15/2018. FINDINGS: 1328 hours. Status post right hip bipolar hemiarthroplasty. The visualized hardware appears intact without loosening. The distal end of the femoral prosthesis is not imaged. There is no evidence of acute fracture or dislocation. Moderate degenerative changes are present at the left hip. Fragmented spurring of the right greater trochanter appears grossly unchanged. IMPRESSION: No evidence of acute pelvic fracture or dislocation. Status post right hip bipolar hemiarthroplasty. Electronically Signed   By: Richardean Sale M.D.   On: 10/10/2022 13:45   DG Chest Port 1 View  Result Date: 10/10/2022 CLINICAL DATA:  Syncope. EXAM: PORTABLE CHEST 1 VIEW COMPARISON:  05/01/2021 and CT chest 06/11/2022. FINDINGS: Trachea is midline. Heart is enlarged, stable. Pacemaker lead tips are in the right atrium  and coronary sinus. ICD lead tip is in the right ventricle. Lungs are clear. No pleural fluid. No pneumothorax. Osseous structures appear grossly intact. Old right rib fractures. IMPRESSION: No acute findings. Electronically Signed   By: Lorin Picket M.D.   On: 10/10/2022 13:45    Pending Labs Unresulted Labs (From admission, onward)     Start     Ordered   10/11/22 0500  CBC  Tomorrow morning,   R        10/10/22 1738   10/11/22 1610  Basic metabolic panel  Tomorrow morning,   R        10/10/22 1738            Vitals/Pain Today's Vitals   10/10/22 1430 10/10/22 1545 10/10/22 1713 10/10/22 1845  BP: 116/80 (!) 108/93 113/77 110/69  Pulse: (!) 58 63 63 66  Resp: '17 20 20 12  '$ Temp:  98.2 F (36.8 C) (!) 97.5 F (36.4 C)   TempSrc:   Oral   SpO2: 98% 99% 97% 92%  PainSc:   7      Isolation Precautions No active isolations  Medications Medications  ondansetron (ZOFRAN) injection 4 mg (4 mg Intravenous Given 10/10/22 1408)  fentaNYL (SUBLIMAZE) injection 50 mcg (  50 mcg Intravenous Given 10/10/22 1408)  sodium chloride 0.9 % bolus 500 mL (500 mLs Intravenous New Bag/Given 10/10/22 1711)    Mobility walks with person assist High fall risk   Focused Assessments Cardiac Assessment Handoff:    Lab Results  Component Value Date   CKTOTAL 29 02/07/2015   TROPONINI 0.07 (H) 10/03/2014   No results found for: "DDIMER" Does the Patient currently have chest pain? No    R Recommendations: See Admitting Provider Note  Report given to:   Additional Notes:

## 2022-10-10 NOTE — ED Notes (Signed)
Patient given urinal and urine requested.

## 2022-10-10 NOTE — Telephone Encounter (Signed)
Alert received from [facility]:  [facility]:  ED, fall, syncope 13 AT/AF episodes, burden <0.1%, Xarelto pre-admission Battery estimated 10mo- Route to triage  Pt scheduled for monthly battery checks d/t battery at 6 months.  Pt currently hospitalized.  Will advise Pt post hospitalization of monthly battery checks.
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
## 2022-10-10 NOTE — ED Provider Notes (Signed)
[facility] EMERGENCY DEPARTMENT Provider Note   CSN: [id number] Arrival date & time: 10/10/22  1305     History PAF, MI, Depression  Chief Complaint  Patient presents with   Chad Brown is a 82 y.o. male.  82 y.o male with a PMH of MI. Depression, PAF on xarelto presents to the ED via POV s/p syncope.  Patient reports he was finishing exercising when suddenly he began to feel dizzy and lightheaded took a step back and actually syncopized on the ground striking the back of his head on the ground.  He reports he was out for a couple of seconds, he woke up with paramedics had arrived, they noted him to be hypotensive.  He denies any prodromal episode.  Does have extensive history of cardiac disease, has been followed in the past by cardiologist Dr. Johnsie Cancel. He did have breakfast this morning including some oatmeal along with toast which are his regular.  He is endorsing pain along his lumbar spine.  There was a brief episodes of loss of consciousness.  He denies any prodromal symptoms.  He is without any chest pain, no shortness of breath, no headache.  The history is provided by the patient and medical records.  Fall This is a new problem. The current episode started 1 to 2 hours ago. The problem occurs constantly. The problem has not changed since onset.Pertinent negatives include no chest pain, no abdominal pain, no headaches and no shortness of breath. Nothing aggravates the symptoms. Nothing relieves the symptoms. He has tried nothing for the symptoms.       Home Medications Prior to Admission medications   Medication Sig Start Date End Date Taking? Authorizing Provider  calcitRIOL (ROCALTROL) 0.25 MCG capsule Take 1 capsule by mouth once a week. 11/02/21 11/02/22  [provider]  calcium carbonate (TUMS - DOSED IN MG ELEMENTAL CALCIUM) 500 MG chewable tablet Chew 1 tablet by mouth daily.    [provider]  carvedilol (COREG) 12.5 MG  tablet Take 1 tablet (12.5 mg total) by mouth 2 (two) times daily. 04/13/22   Larey Dresser, MD  dorzolamide-timolol (COSOPT) 22.3-6.8 MG/ML ophthalmic solution Place 1 drop into the right eye 2 (two) times daily.    [provider]  empagliflozin (JARDIANCE) 10 MG TABS tablet Take by mouth. 01/20/20   [provider]  escitalopram (LEXAPRO) 20 MG tablet Take 1 tablet (20 mg total) by mouth daily. 06/23/19   Bensimhon, Shaune Pascal, MD  ferrous sulfate 325 (65 FE) MG tablet Take 325 mg by mouth daily with breakfast.    [provider]  finasteride (PROSCAR) 5 MG tablet Take 1 tablet (5 mg total) by mouth daily. 06/22/19   Bensimhon, Shaune Pascal, MD  furosemide (LASIX) 40 MG tablet TAKE 1 TABLET EVERY MORNING & 1/2 TABLET EVERY EVENING 02/12/22   Larey Dresser, MD  gabapentin (NEURONTIN) 100 MG capsule Take 200 mg by mouth 2 (two) times daily.     [provider]  latanoprost (XALATAN) 0.005 % ophthalmic solution Place 1 drop into the right eye at bedtime.    [provider]  levothyroxine (SYNTHROID) 125 MCG tablet Take 1 tablet (125 mcg total) by mouth daily. 08/02/22   Elayne Snare, MD  nitroGLYCERIN (NITROSTAT) 0.4 MG SL tablet Place 0.4 mg under the tongue every 5 (five) minutes as needed for chest pain. 12/11/17   [provider]  Polyethyl Glycol-Propyl Glycol 0.4-0.3 % SOLN Place 1  drop into the left eye in the morning and at bedtime.    [provider]  REPATHA SURECLICK 161 MG/ML SOAJ Inject 1 pen into the skin every 14 (fourteen) days. 11/16/21   Larey Dresser, MD  RHOPRESSA 0.02 % SOLN Place 1 drop into the right eye at bedtime. 10/11/21   [provider]  sacubitril-valsartan (ENTRESTO) 24-26 MG Take 0.5 tablets by mouth 2 (two) times daily. 11/03/21   Larey Dresser, MD  spironolactone (ALDACTONE) 25 MG tablet Take 25 mg by mouth daily. Pt takes 1/2 table 12.5 mg in the morning and 1/2 table 12.5 mg at night.    [provider]  tamsulosin (FLOMAX) 0.4 MG CAPS Take 0.4 mg by mouth at bedtime.     [provider]  VITAMIN D PO Take 1 tablet by mouth daily.    [provider]  XARELTO 15 MG TABS tablet TAKE (1) TABLET DAILY WITH SUPPER. 08/30/22   Larey Dresser, MD      Allergies    Pravastatin sodium and Azithromycin    Review of Systems   Review of Systems  Constitutional:  Negative for chills and fever.  HENT:  Negative for sinus pressure.   Respiratory:  Negative for shortness of breath.   Cardiovascular:  Negative for chest pain.  Gastrointestinal:  Negative for abdominal pain, nausea and vomiting.  Genitourinary:  Negative for flank pain.  Musculoskeletal:  Positive for back pain, myalgias and neck pain.  Neurological:  Negative for headaches.  All other systems reviewed and are negative.   Physical Exam Updated Vital Signs BP 113/77 (BP Location: Left Arm)   Pulse 63   Temp (!) 97.5 F (36.4 C) (Oral)   Resp 20   SpO2 97%  Physical Exam Vitals and nursing note reviewed.  Constitutional:      Appearance: Normal appearance.  HENT:     Head: Normocephalic and atraumatic.     Comments: No goose egg or hematoma noted to his head.     Mouth/Throat:     Mouth: Mucous membranes are moist.  Eyes:     Pupils: Pupils are equal, round, and reactive to light.  Cardiovascular:     Rate and Rhythm: Normal rate.  Pulmonary:     Effort: Pulmonary effort is normal.     Comments: Lungs are clear to auscultation.  Abdominal:     General: Abdomen is flat.     Palpations: Abdomen is soft.     Tenderness: There is no abdominal tenderness. There is no right CVA tenderness or left CVA tenderness.  Musculoskeletal:     Cervical back: Normal range of motion and neck supple.  Skin:    General: Skin is warm and dry.  Neurological:     Mental Status: He is alert and oriented to person, place, and time.     Comments: Alert, oriented, thought content appropriate. Speech  fluent without evidence of aphasia. Able to follow 2 step commands without difficulty.  Cranial Nerves:  II:  Peripheral visual fields grossly normal, pupils, round, reactive to light III,IV, VI: ptosis not present, extra-ocular motions intact bilaterally  V,VII: smile symmetric, facial light touch sensation equal VIII: hearing grossly normal bilaterally  IX,X: midline uvula rise  XI: bilateral shoulder shrug equal and strong XII: midline tongue extension  Motor:  5/5 in upper and lower extremities bilaterally including strong and equal grip strength and dorsiflexion/plantar flexion Sensory: light touch normal in all extremities.  Cerebellar: normal finger-to-nose with  bilateral upper extremities, pronator drift negative Gait: not addressed       ED Results / Procedures / Treatments   Labs (all labs ordered are listed, but only abnormal results are displayed) Labs Reviewed  COMPREHENSIVE METABOLIC PANEL - Abnormal; Notable for the following components:      Result Value   Sodium 132 (*)    Glucose, Bld 171 (*)    BUN 38 (*)    Creatinine, Ser 2.35 (*)    GFR, Estimated 27 (*)    All other components within normal limits  CBC - Abnormal; Notable for the following components:   RBC 3.47 (*)    Hemoglobin 11.5 (*)    HCT 36.1 (*)    MCV 104.0 (*)    Platelets 144 (*)    All other components within normal limits  PROTIME-INR - Abnormal; Notable for the following components:   Prothrombin Time 20.0 (*)    INR 1.7 (*)    All other components within normal limits  I-STAT CHEM 8, ED - Abnormal; Notable for the following components:   BUN 36 (*)    Creatinine, Ser 2.50 (*)    Glucose, Bld 159 (*)    Hemoglobin 11.9 (*)    HCT 35.0 (*)    All other components within normal limits  ETHANOL  LACTIC ACID, PLASMA  URINALYSIS, ROUTINE W REFLEX MICROSCOPIC  SAMPLE TO BLOOD BANK  TROPONIN I (HIGH SENSITIVITY)  TROPONIN I (HIGH SENSITIVITY)    EKG None  Radiology CT Lumbar  Spine Wo Contrast  Result Date: 10/10/2022 CLINICAL DATA:  Low back pain, no red flags, no prior management EXAM: CT LUMBAR SPINE WITHOUT CONTRAST TECHNIQUE: Multidetector CT imaging of the lumbar spine was performed without intravenous contrast administration. Multiplanar CT image reconstructions were also generated. RADIATION DOSE REDUCTION: This exam was performed according to the departmental dose-optimization program which includes automated exposure control, adjustment of the mA and/or kV according to patient size and/or use of iterative reconstruction technique. COMPARISON:  CT 07/15/2018 FINDINGS: Segmentation: Transitional lumbosacral vertebrae with lumbarized S1 rudimentary disc at S1-S2. Alignment: Slight dextroconvex curvature. Mild degenerative retrolisthesis at L2-L3 and L3-L4. Trace degenerative anterolisthesis at L5-S1. Vertebrae: There is bridging anterior osteophyte formation in the lower thoracic spine and bulky non bridging osteophytes in the lumbar spine. There is bridging ossification along the posterior elements and multilevel PLL and ligamentum flavum ossification. Nondisplaced transverse fracture of the S5 vertebrae near the sacrococcygeal junction (series 9 images 44-49). No evidence of lumbar spine fracture. Partially visualized right hip arthroplasty. Ossification of the iliolumbar ligaments Paraspinal and other soft tissues: Cholelithiasis. There is a simple exophytic left renal cyst arising from the posterior kidney measuring up to 5.5 cm, which requires no follow-up imaging. Aortoiliac atherosclerosis. Extensive colonic diverticulosis. Disc levels: There is multilevel ligament flavum and posterior longitudinal ligament ossification, multilevel degenerative disc disease, and severe multilevel facet arthropathy resulting in varying degrees of spinal canal neural foraminal stenosis. Of note, there appears to be severe spinal canal stenosis at L2-L3 related to bony hypertrophy and at  least moderate spinal canal stenosis at L4-L5 and L5-S1. There is moderate-severe multilevel neural foraminal stenosis. IMPRESSION: Transitional lumbosacral vertebrae with lumbarized S1 and rudimentary disc at S1-S2. Nondisplaced transverse fracture of the S5 vertebrae near the sacrococcygeal junction. No evidence of lumbar spine fracture. Diffuse idiopathic skeletal hyperostosis, with multilevel ligament flavum and PLL ossification, multilevel degenerative disc disease, and severe multilevel facet arthropathy resulting in varying degrees of spinal canal and neural foraminal  stenosis, as described above. Electronically Signed   By: Maurine Simmering M.D.   On: 10/10/2022 14:40   CT HEAD WO CONTRAST  Result Date: 10/10/2022 CLINICAL DATA:  Fall. EXAM: CT HEAD WITHOUT CONTRAST CT CERVICAL SPINE WITHOUT CONTRAST TECHNIQUE: Multidetector CT imaging of the head and cervical spine was performed following the standard protocol without intravenous contrast. Multiplanar CT image reconstructions of the cervical spine were also generated. RADIATION DOSE REDUCTION: This exam was performed according to the departmental dose-optimization program which includes automated exposure control, adjustment of the mA and/or kV according to patient size and/or use of iterative reconstruction technique. COMPARISON:  None Available. FINDINGS: CT HEAD FINDINGS Brain: No evidence of acute infarction, hemorrhage, hydrocephalus, extra-axial collection or mass lesion/mass effect. Sequela moderate chronic microvascular ischemic change. Vascular: No hyperdense vessel or unexpected calcification. Skull: Normal. Negative for fracture or focal lesion. Sinuses/Orbits: Bilateral lens replacement. Trace mucosal thickening bilateral ethmoid sinuses and maxillary sinuses. No middle ear or mastoid effusion. Other: None. CT CERVICAL SPINE FINDINGS Alignment: Straightening of the normal cervical lordosis. Skull base and vertebrae: No acute fracture. No primary  bone lesion or focal pathologic process. Soft tissues and spinal canal: No prevertebral fluid or swelling. No visible canal hematoma. Disc levels: There are findings of DISH with extensive OPLL. There is severe spinal canal stenosis at T2-T3 due to OPLL. There is likely also moderate spinal canal stenosis at the C2 and C2-C3 level secondary to OPLL. Upper chest: Negative. Other: None IMPRESSION: 1. No acute intracranial abnormality. 2. No acute fracture or traumatic malalignment of the cervical spine. 3. Findings of DISH with extensive OPLL. There is severe spinal canal stenosis at T2-T3 due to OPLL. There is likely also moderate spinal canal stenosis at the C2 and C2-C3 level secondary to OPLL. In the setting of trauma, if there is any concern for myelopathic symptoms, further evaluation with a cervical spine MRI is recommended to exclude the possibility of cord injury. Electronically Signed   By: Marin Roberts M.D.   On: 10/10/2022 14:17   CT CERVICAL SPINE WO CONTRAST  Result Date: 10/10/2022 CLINICAL DATA:  Fall. EXAM: CT HEAD WITHOUT CONTRAST CT CERVICAL SPINE WITHOUT CONTRAST TECHNIQUE: Multidetector CT imaging of the head and cervical spine was performed following the standard protocol without intravenous contrast. Multiplanar CT image reconstructions of the cervical spine were also generated. RADIATION DOSE REDUCTION: This exam was performed according to the departmental dose-optimization program which includes automated exposure control, adjustment of the mA and/or kV according to patient size and/or use of iterative reconstruction technique. COMPARISON:  None Available. FINDINGS: CT HEAD FINDINGS Brain: No evidence of acute infarction, hemorrhage, hydrocephalus, extra-axial collection or mass lesion/mass effect. Sequela moderate chronic microvascular ischemic change. Vascular: No hyperdense vessel or unexpected calcification. Skull: Normal. Negative for fracture or focal lesion. Sinuses/Orbits:  Bilateral lens replacement. Trace mucosal thickening bilateral ethmoid sinuses and maxillary sinuses. No middle ear or mastoid effusion. Other: None. CT CERVICAL SPINE FINDINGS Alignment: Straightening of the normal cervical lordosis. Skull base and vertebrae: No acute fracture. No primary bone lesion or focal pathologic process. Soft tissues and spinal canal: No prevertebral fluid or swelling. No visible canal hematoma. Disc levels: There are findings of DISH with extensive OPLL. There is severe spinal canal stenosis at T2-T3 due to OPLL. There is likely also moderate spinal canal stenosis at the C2 and C2-C3 level secondary to OPLL. Upper chest: Negative. Other: None IMPRESSION: 1. No acute intracranial abnormality. 2. No acute fracture or traumatic malalignment of  the cervical spine. 3. Findings of DISH with extensive OPLL. There is severe spinal canal stenosis at T2-T3 due to OPLL. There is likely also moderate spinal canal stenosis at the C2 and C2-C3 level secondary to OPLL. In the setting of trauma, if there is any concern for myelopathic symptoms, further evaluation with a cervical spine MRI is recommended to exclude the possibility of cord injury. Electronically Signed   By: Marin Roberts M.D.   On: 10/10/2022 14:17   DG Pelvis Portable  Result Date: 10/10/2022 CLINICAL DATA:  Fall.  Back pain. EXAM: PORTABLE PELVIS 1-2 VIEWS COMPARISON:  PET-CT 12/07/2020.  Pelvic CT 07/15/2018. FINDINGS: 1328 hours. Status post right hip bipolar hemiarthroplasty. The visualized hardware appears intact without loosening. The distal end of the femoral prosthesis is not imaged. There is no evidence of acute fracture or dislocation. Moderate degenerative changes are present at the left hip. Fragmented spurring of the right greater trochanter appears grossly unchanged. IMPRESSION: No evidence of acute pelvic fracture or dislocation. Status post right hip bipolar hemiarthroplasty. Electronically Signed   By: Richardean Sale  M.D.   On: 10/10/2022 13:45   DG Chest Port 1 View  Result Date: 10/10/2022 CLINICAL DATA:  Syncope. EXAM: PORTABLE CHEST 1 VIEW COMPARISON:  05/01/2021 and CT chest 06/11/2022. FINDINGS: Trachea is midline. Heart is enlarged, stable. Pacemaker lead tips are in the right atrium and coronary sinus. ICD lead tip is in the right ventricle. Lungs are clear. No pleural fluid. No pneumothorax. Osseous structures appear grossly intact. Old right rib fractures. IMPRESSION: No acute findings. Electronically Signed   By: Lorin Picket M.D.   On: 10/10/2022 13:45    Procedures Procedures   Medications Ordered in ED Medications  ondansetron (ZOFRAN) injection 4 mg (4 mg Intravenous Given 10/10/22 1408)  fentaNYL (SUBLIMAZE) injection 50 mcg (50 mcg Intravenous Given 10/10/22 1408)  sodium chloride 0.9 % bolus 500 mL (500 mLs Intravenous New Bag/Given 10/10/22 1711)    ED Course/ Medical Decision Making/ A&P                           Medical Decision Making Amount and/or Complexity of Data Reviewed Labs: ordered. Radiology: ordered. ECG/medicine tests: ordered.  Risk Prescription drug management. Decision regarding hospitalization.   This patient presents to the ED for concern of syncope, this involves a number of treatment options, and is a complaint that carries with it a high risk of complications and morbidity.  The differential diagnosis includes ACS, CVA, electrolyte derangement versus orthostasis.   Co morbidities: Discussed in HPI   Brief History:  With underlying PAF on Xarelto here status post [place] episode at the gym after exerting himself during a workup.  Prior cardiac history and followed by Dr. Corky Downs of cardiology.  No prodromal symptoms such as chest pain or shortness of breath, or headache.  Endorsing pain along the neck and lumbar spine.  EMR reviewed including pt PMHx, past surgical history and past visits to ER.   See HPI for more details   Lab Tests:  I  ordered and independently interpreted labs.  The pertinent results include:    Labs notable for CBC with no leukocytosis, hemoglobin is within normal limits. CMP with elevated creatine at 2/35, within his baseline. PT INR is within his baseline he is anticoagulated on Xarelto for paroxysmal A-fib..  Actiq acid is negative.  Troponin is 12, will second 1 at this time.   Imaging Studies:  CT  HEAD.NECK: 1. No acute intracranial abnormality.  2. No acute fracture or traumatic malalignment of the cervical  spine.  3. Findings of DISH with extensive OPLL. There is severe spinal  canal stenosis at T2-T3 due to OPLL. There is likely also moderate  spinal canal stenosis at the C2 and C2-C3 level secondary to OPLL.  In the setting of trauma, if there is any concern for myelopathic  symptoms, further evaluation with a cervical spine MRI is  recommended to exclude the possibility of cord injury.   CT lumbar spine: Transitional lumbosacral vertebrae with lumbarized S1 and  rudimentary disc at S1-S2.    Nondisplaced transverse fracture of the S5 vertebrae near the  sacrococcygeal junction.    No evidence of lumbar spine fracture.    Diffuse idiopathic skeletal hyperostosis, with multilevel ligament  flavum and PLL ossification, multilevel degenerative disc disease,  and severe multilevel facet arthropathy resulting in varying degrees  of spinal canal and neural foraminal stenosis, as described above.    Cardiac Monitoring:  The patient was maintained on a cardiac monitor.  I personally viewed and interpreted the cardiac monitored which showed an underlying rhythm of: pacemaker NSR EKG non-ischemic   Medicines ordered:  I ordered medication including fentanyl  for back pain Reevaluation of the patient after these medicines showed that the patient resolved I have reviewed the patients home medicines and have made adjustments as needed  Reevaluation:  After the interventions noted above  I re-evaluated patient and found that they have :improved   Social Determinants of Health:  The patient's social determinants of health were a factor in the care of this patient  Problem List / ED Course:  Patient here s/p syncopal episode which occurred while he was at the gym, positive loss of consciousness.  No prodromal signs such as chest pain, shortness of breath, headache.  Reports he was awoken by EMS bystanders.  Found to have soft pressures on arrival which later improved.  No recent illness, no recent medication changes.  Are consistent with his baseline, creatinine level continues to be elevated.  CBC with no leukocytosis.  Imaging of his head, neck, pelvis, chest, lumbar spine all without any acute findings at this time.  Patient is anticoagulated on Xarelto, therefore trauma activation was required.  He did have orthostatic vital signs, with perhaps some orthostasis noted, given a 500 bolus to help with symptoms.  Last echocardiogram in the month of November last year with an EF of 55-60.  Troponin was negative, will continue to trend this.  Lactic acid is negative.  Due to patient's age per [facility] syncopal rule I do feel that patient is needing of admission at this time as he was somewhat hypotensive on the scene.  He is not complaining of any pain.  I spoke to hospitalist Dr. Avon Gully who will evaluate patient and agrees with admission at this time. Patient hemodynamically stable for admission.   Dispostion:  After consideration of the diagnostic results and the patients response to treatment, I feel that the patent would benefit from admission for observation due to syncopal episode.     Portions of this note were generated with [employer]. Dictation errors may occur despite best attempts at proofreading.   Final Clinical Impression(s) / ED Diagnoses Final diagnoses:  Fall, initial encounter  Syncope, unspecified syncope type    Rx / DC Orders ED  Discharge Orders     None         Janeece Fitting,  PA-C 10/10/22 1736    Tegeler, Gwenyth Allegra, MD 10/15/22 [phone]

## 2022-10-11 ENCOUNTER — Encounter (HOSPITAL_COMMUNITY): Payer: Self-pay | Admitting: Internal Medicine

## 2022-10-11 DIAGNOSIS — I951 Orthostatic hypotension: Secondary | ICD-10-CM

## 2022-10-11 LAB — BASIC METABOLIC PANEL
Anion gap: 12 (ref 5–15)
BUN: 36 mg/dL — ABNORMAL HIGH (ref 8–23)
CO2: 25 mmol/L (ref 22–32)
Calcium: 8.5 mg/dL — ABNORMAL LOW (ref 8.9–10.3)
Chloride: 101 mmol/L (ref 98–111)
Creatinine, Ser: 2.3 mg/dL — ABNORMAL HIGH (ref 0.61–1.24)
GFR, Estimated: 28 mL/min — ABNORMAL LOW (ref >60)
Glucose, Bld: 114 mg/dL — ABNORMAL HIGH (ref 70–99)
Potassium: 4 mmol/L (ref 3.5–5.1)
Sodium: 138 mmol/L (ref 135–145)

## 2022-10-11 LAB — CBC
HCT: 31.5 % — ABNORMAL LOW (ref 39.0–52.0)
Hemoglobin: 10.4 g/dL — ABNORMAL LOW (ref 13.0–17.0)
MCH: 33.7 pg (ref 26.0–34.0)
MCHC: 33 g/dL (ref 30.0–36.0)
MCV: 101.9 fL — ABNORMAL HIGH (ref 80.0–100.0)
Platelets: 137 10*3/uL — ABNORMAL LOW (ref 150–400)
RBC: 3.09 MIL/uL — ABNORMAL LOW (ref 4.22–5.81)
RDW: 14.2 % (ref 11.5–15.5)
WBC: 7.9 10*3/uL (ref 4.0–10.5)
nRBC: 0 % (ref 0.0–0.2)

## 2022-10-11 MED ORDER — LATANOPROST 0.005 % OP SOLN
1.0000 [drp] | Freq: Every day | OPHTHALMIC | Status: DC
  Filled 2022-10-11: qty 2.5

## 2022-10-11 MED ORDER — ESCITALOPRAM OXALATE 20 MG PO TABS
20.0000 mg | ORAL_TABLET | Freq: Every day | ORAL | Status: DC
  Administered 2022-10-11: 20 mg via ORAL
  Filled 2022-10-11: qty 1

## 2022-10-11 MED ORDER — TAMSULOSIN HCL 0.4 MG PO CAPS: 0.4000 mg | ORAL_CAPSULE | Freq: Every day | ORAL | Status: DC

## 2022-10-11 MED ORDER — POLYVINYL ALCOHOL 1.4 % OP SOLN
1.0000 [drp] | Freq: Two times a day (BID) | OPHTHALMIC | Status: DC
  Filled 2022-10-11: qty 15

## 2022-10-11 MED ORDER — RIVAROXABAN 15 MG PO TABS: 15.0000 mg | ORAL_TABLET | Freq: Every day | ORAL | Status: DC

## 2022-10-11 MED ORDER — EMPAGLIFLOZIN 10 MG PO TABS: 10.0000 mg | ORAL_TABLET | ORAL | Status: DC

## 2022-10-11 MED ORDER — NETARSUDIL DIMESYLATE 0.02 % OP SOLN: 1.0000 [drp] | Freq: Every day | OPHTHALMIC | Status: DC

## 2022-10-11 MED ORDER — FINASTERIDE 5 MG PO TABS
5.0000 mg | ORAL_TABLET | Freq: Every day | ORAL | Status: DC
  Administered 2022-10-11: 5 mg via ORAL
  Filled 2022-10-11: qty 1

## 2022-10-11 MED ORDER — GABAPENTIN 100 MG PO CAPS
200.0000 mg | ORAL_CAPSULE | Freq: Two times a day (BID) | ORAL | Status: DC
  Administered 2022-10-11: 200 mg via ORAL
  Filled 2022-10-11: qty 2

## 2022-10-11 MED ORDER — DORZOLAMIDE HCL-TIMOLOL MAL 2-0.5 % OP SOLN
1.0000 [drp] | Freq: Two times a day (BID) | OPHTHALMIC | Status: DC
  Filled 2022-10-11: qty 10

## 2022-10-11 MED ORDER — ACETAMINOPHEN 325 MG PO TABS
650.0000 mg | ORAL_TABLET | Freq: Four times a day (QID) | ORAL | Status: DC | PRN
  Administered 2022-10-11: 650 mg via ORAL
  Filled 2022-10-11: qty 2

## 2022-10-11 MED ORDER — LEVOTHYROXINE SODIUM 25 MCG PO TABS
125.0000 ug | ORAL_TABLET | Freq: Every day | ORAL | Status: DC
  Administered 2022-10-11: 125 ug via ORAL
  Filled 2022-10-11: qty 1

## 2022-10-11 MED ORDER — MUSCLE RUB 10-15 % EX CREA
TOPICAL_CREAM | CUTANEOUS | Status: DC | PRN
  Administered 2022-10-11: 1 via TOPICAL
  Filled 2022-10-11: qty 85

## 2022-10-11 MED ORDER — SPIRONOLACTONE 25 MG PO TABS
25.0000 mg | ORAL_TABLET | Freq: Every day | ORAL | Status: DC
  Administered 2022-10-11: 25 mg via ORAL
  Filled 2022-10-11: qty 1

## 2022-10-11 MED ORDER — FUROSEMIDE 20 MG PO TABS
20.0000 mg | ORAL_TABLET | Freq: Two times a day (BID) | ORAL | Status: DC
  Administered 2022-10-11: 20 mg via ORAL
  Filled 2022-10-11: qty 1

## 2022-10-11 MED ORDER — RIVAROXABAN 15 MG PO TABS
15.0000 mg | ORAL_TABLET | Freq: Once | ORAL | Status: AC
  Administered 2022-10-11: 15 mg via ORAL
  Filled 2022-10-11: qty 1

## 2022-10-11 MED ORDER — FUROSEMIDE 20 MG PO TABS: 20.0000 mg | ORAL_TABLET | Freq: Two times a day (BID) | ORAL | 0 refills | Status: DC

## 2022-10-11 NOTE — Progress Notes (Signed)
Discharge instructions reviewed with pt and significant other.  Copy of instructions given to pt. Pt informed of script sent to his pharmacy for pick up.   Pt to be d/c'd via wheelchair with belongings, with significant other. Significant other sent to get car.            To be escorted by hospital volunteer.

## 2022-10-11 NOTE — Plan of Care (Signed)

## 2022-10-11 NOTE — Progress Notes (Signed)
EPIC Encounter for ICM Monitoring  Patient Name: Chad Brown is a 82 y.o. male Date: 10/11/2022 Primary Care Physican: Ginger Organ., MD Primary Cardiologist: Nishan/McLean Electrophysiologist: Cyndi Bender Pacing: 92.8%   07/24/2022 Weight 210 lbs 08/31/2022 Weight: 210 lbs   Clinical Status   Since 30-Aug-2022 Time in AT/AF  <0.1 hr/day (0.1%) (taking Xarelto)   Battery Longevity: 6 months - Discussed battery replacement and monitoring monthly     Spoke with patient and heart failure questions reviewed.  Transmission results reviewed.  Pt asymptomatic for fluid accumulation.   ED Visit 10/11/2022 due to fall from passing out at the gym and ED physician thinks it is due to low BP.  Will be setting up appts with Dr Aundra Dubin and Dr Johnsie Cancel.  ED physician stopped Entresto, Spironolactone and decreased Furosemide to 20 mg bid until he has visit with cardiologist.     Optivol thoracic impedance suggesting normal fluid levels.     Prescribed:  Furosemide 40 mg 1 tablet (40 mg total) by mouth in the morning and 0.5 tablet (20 mg total)  every evening.  20 mg  Spironolactone 25 mg Take 1 tablet (25 mg total) by mouth daily. Take at bedtime   Labs: 03/15/2022 Creatine 2.01, BUN 26, Potassium 4.5, Sodium 139, GFR 33 A complete set of results can be found in Results Review.   Recommendations:  He plans on calling today to schedule cardiology visits post ED visit.    Follow-up plan: ICM clinic phone appointment on 11/12/2022.  91 day device clinic remote transmission 11/29/2022.     EP/Cardiology Next Office Visit:   Recall 12/02/2022 with Dr Curt Bears.    Recall 02/17/2022 with Dr Aundra Dubin for 6 month f/u.    Due to see Dr Johnsie Cancel 11/2022.    Copy of ICM check sent to Dr. Curt Bears.    3 month ICM trend: 10/08/2022.    12-14 Month ICM trend:     Rosalene Billings, RN 10/11/2022 3:09 PM

## 2022-10-11 NOTE — Discharge Summary (Signed)
Physician Discharge Summary  Chad Brown SWN:[id number] DOB: Feb 09, 1941 DOA: 10/10/2022  PCP: Ginger Organ., MD  Admit date: 10/10/2022 Discharge date: 10/11/2022  Admitted From: Home  Disposition: Home  Recommendations for Outpatient Follow-up:  Follow up with PCP in 1-2 weeks Please follow-up with cardiology in the next 1 to 2 weeks for further evaluation of hypotension given recent medication changes as below with discontinuation of Entresto and decrease Lasix  Home Health: None Equipment/Devices: None  Discharge Condition: Stable CODE STATUS: Full Diet recommendation: As tolerated low-salt diet  Brief/Interim Summary: Chad Brown is a 82 y.o. male with medical history significant of atrial fibrillation status post ablation and pacemaker placement chronically on anticoagulation with Xarelto, heart failure, systolic EF 40 to 93% with global hypokinesis of the left ventricle, CAD, iron deficiency/chronic anemia of chronic disease, hypertension, hyperlipidemia, hypothyroidism, obesity, sleep apnea.  Patient presents after syncopal event after working out at the gym today.  He denies any recent symptoms or changes in his activity levels.  He worked out his normal routine with no issues.  Approximately 1 minute after finishing workup patient stood up and walked across the gym where he subsequently had a syncopal event with no prodrome.  Patient found to be markedly hypotensive via orthostatics in the ED, hypotensive at intake as well per EMS.  With IV fluids patient's blood pressure continues to improve, he remains somewhat symptomatic with orthostatic position changes but he indicates this is a chronic problem for him.  Patient's blood pressure remains low despite holding Entresto, decrease Lasix to 20 twice daily from 40 and 20 but did continue spironolactone.  Given patient's improvement in symptoms and improved blood pressure off Entresto we will continue his regimen as below.   Recommend close follow-up with his cardiology office in the next week to reevaluate patient's blood pressure and volume status given medication changes as below.    Discharge Diagnoses:  Principal Problem:   Hypotension    Discharge Instructions   Allergies as of 10/11/2022       Reactions   Pravastatin Sodium Other (See Comments)   Joint and muscle pain   Azithromycin Diarrhea        Medication List     STOP taking these medications    carvedilol 12.5 MG tablet Commonly known as: COREG   Entresto 24-26 MG Generic drug: sacubitril-valsartan       TAKE these medications    calcitRIOL 0.25 MCG capsule Commonly known as: ROCALTROL Take 1 capsule by mouth once a week.   calcium carbonate 500 MG chewable tablet Commonly known as: TUMS - dosed in mg elemental calcium Chew 2 tablets by mouth 2 (two) times daily.   cholecalciferol 25 MCG (1000 UNIT) tablet Commonly known as: VITAMIN D3 Take 1,000 Units by mouth daily.   dorzolamide-timolol 2-0.5 % ophthalmic solution Commonly known as: COSOPT Place 1 drop into the right eye 2 (two) times daily.   escitalopram 20 MG tablet Commonly known as: LEXAPRO Take 1 tablet (20 mg total) by mouth daily.   ferrous sulfate 325 (65 FE) MG tablet Take 325 mg by mouth daily with breakfast.   finasteride 5 MG tablet Commonly known as: PROSCAR Take 1 tablet (5 mg total) by mouth daily.   FOLIC ACID PO Take 1 tablet by mouth daily.   furosemide 20 MG tablet Commonly known as: LASIX Take 1 tablet (20 mg total) by mouth 2 (two) times daily. What changed:  medication strength See the new instructions.  gabapentin 100 MG capsule Commonly known as: NEURONTIN Take 200 mg by mouth 2 (two) times daily.   Jardiance 10 MG Tabs tablet Generic drug: empagliflozin Take 10 mg by mouth every Monday, Wednesday, and Friday.   latanoprost 0.005 % ophthalmic solution Commonly known as: XALATAN Place 1 drop into the right eye at  bedtime.   levothyroxine 125 MCG tablet Commonly known as: SYNTHROID Take 1 tablet (125 mcg total) by mouth daily.   nitroGLYCERIN 0.4 MG SL tablet Commonly known as: NITROSTAT Place 0.4 mg under the tongue every 5 (five) minutes as needed for chest pain.   Polyethyl Glycol-Propyl Glycol 0.4-0.3 % Soln Place 1 drop into the left eye in the morning and at bedtime.   Repatha SureClick 096 MG/ML Soaj Generic drug: Evolocumab Inject 1 pen into the skin every 14 (fourteen) days.   Rhopressa 0.02 % Soln Generic drug: Netarsudil Dimesylate Place 1 drop into the right eye at bedtime.   spironolactone 25 MG tablet Commonly known as: ALDACTONE Take 25 mg by mouth daily. Pt takes 1/2 table 12.5 mg in the morning and 1/2 table 12.5 mg at night.   tamsulosin 0.4 MG Caps capsule Commonly known as: FLOMAX Take 0.4 mg by mouth at bedtime.   VITAMIN B 12 PO Take 1 tablet by mouth daily.   Xarelto 15 MG Tabs tablet Generic drug: Rivaroxaban TAKE (1) TABLET DAILY WITH SUPPER. What changed: See the new instructions.        Allergies  Allergen Reactions   Pravastatin Sodium Other (See Comments)    Joint and muscle pain   Azithromycin Diarrhea    Consultations: None  Procedures/Studies: CT Lumbar Spine Wo Contrast  Result Date: 10/10/2022 CLINICAL DATA:  Low back pain, no red flags, no prior management EXAM: CT LUMBAR SPINE WITHOUT CONTRAST TECHNIQUE: Multidetector CT imaging of the lumbar spine was performed without intravenous contrast administration. Multiplanar CT image reconstructions were also generated. RADIATION DOSE REDUCTION: This exam was performed according to the departmental dose-optimization program which includes automated exposure control, adjustment of the mA and/or kV according to patient size and/or use of iterative reconstruction technique. COMPARISON:  CT 07/15/2018 FINDINGS: Segmentation: Transitional lumbosacral vertebrae with lumbarized S1 rudimentary disc at  S1-S2. Alignment: Slight dextroconvex curvature. Mild degenerative retrolisthesis at L2-L3 and L3-L4. Trace degenerative anterolisthesis at L5-S1. Vertebrae: There is bridging anterior osteophyte formation in the lower thoracic spine and bulky non bridging osteophytes in the lumbar spine. There is bridging ossification along the posterior elements and multilevel PLL and ligamentum flavum ossification. Nondisplaced transverse fracture of the S5 vertebrae near the sacrococcygeal junction (series 9 images 44-49). No evidence of lumbar spine fracture. Partially visualized right hip arthroplasty. Ossification of the iliolumbar ligaments Paraspinal and other soft tissues: Cholelithiasis. There is a simple exophytic left renal cyst arising from the posterior kidney measuring up to 5.5 cm, which requires no follow-up imaging. Aortoiliac atherosclerosis. Extensive colonic diverticulosis. Disc levels: There is multilevel ligament flavum and posterior longitudinal ligament ossification, multilevel degenerative disc disease, and severe multilevel facet arthropathy resulting in varying degrees of spinal canal neural foraminal stenosis. Of note, there appears to be severe spinal canal stenosis at L2-L3 related to bony hypertrophy and at least moderate spinal canal stenosis at L4-L5 and L5-S1. There is moderate-severe multilevel neural foraminal stenosis. IMPRESSION: Transitional lumbosacral vertebrae with lumbarized S1 and rudimentary disc at S1-S2. Nondisplaced transverse fracture of the S5 vertebrae near the sacrococcygeal junction. No evidence of lumbar spine fracture. Diffuse idiopathic skeletal hyperostosis, with multilevel ligament  flavum and PLL ossification, multilevel degenerative disc disease, and severe multilevel facet arthropathy resulting in varying degrees of spinal canal and neural foraminal stenosis, as described above. Electronically Signed   By: Maurine Simmering M.D.   On: 10/10/2022 14:40   CT HEAD WO  CONTRAST  Result Date: 10/10/2022 CLINICAL DATA:  Fall. EXAM: CT HEAD WITHOUT CONTRAST CT CERVICAL SPINE WITHOUT CONTRAST TECHNIQUE: Multidetector CT imaging of the head and cervical spine was performed following the standard protocol without intravenous contrast. Multiplanar CT image reconstructions of the cervical spine were also generated. RADIATION DOSE REDUCTION: This exam was performed according to the departmental dose-optimization program which includes automated exposure control, adjustment of the mA and/or kV according to patient size and/or use of iterative reconstruction technique. COMPARISON:  None Available. FINDINGS: CT HEAD FINDINGS Brain: No evidence of acute infarction, hemorrhage, hydrocephalus, extra-axial collection or mass lesion/mass effect. Sequela moderate chronic microvascular ischemic change. Vascular: No hyperdense vessel or unexpected calcification. Skull: Normal. Negative for fracture or focal lesion. Sinuses/Orbits: Bilateral lens replacement. Trace mucosal thickening bilateral ethmoid sinuses and maxillary sinuses. No middle ear or mastoid effusion. Other: None. CT CERVICAL SPINE FINDINGS Alignment: Straightening of the normal cervical lordosis. Skull base and vertebrae: No acute fracture. No primary bone lesion or focal pathologic process. Soft tissues and spinal canal: No prevertebral fluid or swelling. No visible canal hematoma. Disc levels: There are findings of DISH with extensive OPLL. There is severe spinal canal stenosis at T2-T3 due to OPLL. There is likely also moderate spinal canal stenosis at the C2 and C2-C3 level secondary to OPLL. Upper chest: Negative. Other: None IMPRESSION: 1. No acute intracranial abnormality. 2. No acute fracture or traumatic malalignment of the cervical spine. 3. Findings of DISH with extensive OPLL. There is severe spinal canal stenosis at T2-T3 due to OPLL. There is likely also moderate spinal canal stenosis at the C2 and C2-C3 level secondary  to OPLL. In the setting of trauma, if there is any concern for myelopathic symptoms, further evaluation with a cervical spine MRI is recommended to exclude the possibility of cord injury. Electronically Signed   By: Marin Roberts M.D.   On: 10/10/2022 14:17   CT CERVICAL SPINE WO CONTRAST  Result Date: 10/10/2022 CLINICAL DATA:  Fall. EXAM: CT HEAD WITHOUT CONTRAST CT CERVICAL SPINE WITHOUT CONTRAST TECHNIQUE: Multidetector CT imaging of the head and cervical spine was performed following the standard protocol without intravenous contrast. Multiplanar CT image reconstructions of the cervical spine were also generated. RADIATION DOSE REDUCTION: This exam was performed according to the departmental dose-optimization program which includes automated exposure control, adjustment of the mA and/or kV according to patient size and/or use of iterative reconstruction technique. COMPARISON:  None Available. FINDINGS: CT HEAD FINDINGS Brain: No evidence of acute infarction, hemorrhage, hydrocephalus, extra-axial collection or mass lesion/mass effect. Sequela moderate chronic microvascular ischemic change. Vascular: No hyperdense vessel or unexpected calcification. Skull: Normal. Negative for fracture or focal lesion. Sinuses/Orbits: Bilateral lens replacement. Trace mucosal thickening bilateral ethmoid sinuses and maxillary sinuses. No middle ear or mastoid effusion. Other: None. CT CERVICAL SPINE FINDINGS Alignment: Straightening of the normal cervical lordosis. Skull base and vertebrae: No acute fracture. No primary bone lesion or focal pathologic process. Soft tissues and spinal canal: No prevertebral fluid or swelling. No visible canal hematoma. Disc levels: There are findings of DISH with extensive OPLL. There is severe spinal canal stenosis at T2-T3 due to OPLL. There is likely also moderate spinal canal stenosis at the C2 and C2-C3  level secondary to OPLL. Upper chest: Negative. Other: None IMPRESSION: 1. No acute  intracranial abnormality. 2. No acute fracture or traumatic malalignment of the cervical spine. 3. Findings of DISH with extensive OPLL. There is severe spinal canal stenosis at T2-T3 due to OPLL. There is likely also moderate spinal canal stenosis at the C2 and C2-C3 level secondary to OPLL. In the setting of trauma, if there is any concern for myelopathic symptoms, further evaluation with a cervical spine MRI is recommended to exclude the possibility of cord injury. Electronically Signed   By: Marin Roberts M.D.   On: 10/10/2022 14:17   DG Pelvis Portable  Result Date: 10/10/2022 CLINICAL DATA:  Fall.  Back pain. EXAM: PORTABLE PELVIS 1-2 VIEWS COMPARISON:  PET-CT 12/07/2020.  Pelvic CT 07/15/2018. FINDINGS: 1328 hours. Status post right hip bipolar hemiarthroplasty. The visualized hardware appears intact without loosening. The distal end of the femoral prosthesis is not imaged. There is no evidence of acute fracture or dislocation. Moderate degenerative changes are present at the left hip. Fragmented spurring of the right greater trochanter appears grossly unchanged. IMPRESSION: No evidence of acute pelvic fracture or dislocation. Status post right hip bipolar hemiarthroplasty. Electronically Signed   By: Richardean Sale M.D.   On: 10/10/2022 13:45   DG Chest Port 1 View  Result Date: 10/10/2022 CLINICAL DATA:  Syncope. EXAM: PORTABLE CHEST 1 VIEW COMPARISON:  05/01/2021 and CT chest 06/11/2022. FINDINGS: Trachea is midline. Heart is enlarged, stable. Pacemaker lead tips are in the right atrium and coronary sinus. ICD lead tip is in the right ventricle. Lungs are clear. No pleural fluid. No pneumothorax. Osseous structures appear grossly intact. Old right rib fractures. IMPRESSION: No acute findings. Electronically Signed   By: Lorin Picket M.D.   On: 10/10/2022 13:45     Subjective: No acute issues or events overnight, orthostatic symptoms continue to improve   Discharge Exam: Vitals:    10/11/22 0446 10/11/22 0828  BP: 107/64 115/67  Pulse: 61 67  Resp: 16 17  Temp: 98.4 F (36.9 C) 97.6 F (36.4 C)  SpO2: 96% 99%   Vitals:   10/10/22 2156 10/11/22 0020 10/11/22 0446 10/11/22 0828  BP: 115/79 111/64 107/64 115/67  Pulse: 62 61 61 67  Resp: '17 18 16 17  '$ Temp: (!) 97.5 F (36.4 C) 98 F (36.7 C) 98.4 F (36.9 C) 97.6 F (36.4 C)  TempSrc: Oral Oral Oral Oral  SpO2: 98% 95% 96% 99%    General: Pt is alert, awake, not in acute distress Cardiovascular: RRR, S1/S2 +, no rubs, no gallops Respiratory: CTA bilaterally, no wheezing, no rhonchi Abdominal: Soft, NT, ND, bowel sounds + Extremities: no edema, no cyanosis    The results of significant diagnostics from this hospitalization (including imaging, microbiology, ancillary and laboratory) are listed below for reference.     Microbiology: No results found for this or any previous visit (from the past 240 hour(s)).   Labs: BNP (last 3 results) No results for input(s): "BNP" in the last 8760 hours. Basic Metabolic Panel: Recent Labs  Lab 10/10/22 1324 10/10/22 1333 10/11/22 0438  NA 132* 137 138  K 4.3 4.1 4.0  CL 98 99 101  CO2 25  --  25  GLUCOSE 171* 159* 114*  BUN 38* 36* 36*  CREATININE 2.35* 2.50* 2.30*  CALCIUM 9.0  --  8.5*   Liver Function Tests: Recent Labs  Lab 10/10/22 1324  AST 17  ALT 21  ALKPHOS 52  BILITOT 0.4  PROT 6.8  ALBUMIN 3.6   No results for input(s): "LIPASE", "AMYLASE" in the last 168 hours. No results for input(s): "AMMONIA" in the last 168 hours. CBC: Recent Labs  Lab 10/10/22 1324 10/10/22 1333 10/11/22 0438  WBC 7.5  --  7.9  HGB 11.5* 11.9* 10.4*  HCT 36.1* 35.0* 31.5*  MCV 104.0*  --  101.9*  PLT 144*  --  137*   Urinalysis    Component Value Date/Time   COLORURINE YELLOW 10/10/2022 1700   APPEARANCEUR CLEAR 10/10/2022 1700   LABSPEC 1.010 10/10/2022 1700   PHURINE 5.0 10/10/2022 1700   GLUCOSEU >=500 (A) 10/10/2022 1700   HGBUR NEGATIVE  10/10/2022 1700   BILIRUBINUR NEGATIVE 10/10/2022 1700   KETONESUR NEGATIVE 10/10/2022 1700   PROTEINUR NEGATIVE 10/10/2022 1700   UROBILINOGEN 1.0 03/21/2015 0759   NITRITE NEGATIVE 10/10/2022 1700   LEUKOCYTESUR NEGATIVE 10/10/2022 1700   Sepsis Labs Recent Labs  Lab 10/10/22 1324 10/11/22 0438  WBC 7.5 7.9   Microbiology No results found for this or any previous visit (from the past 240 hour(s)).   Time coordinating discharge: Over 30 minutes  SIGNED:   Little Ishikawa, DO [facility] 10/11/2022, 2:16 PM Pager   If 7PM-7AM, please contact night-coverage [email or web address]

## 2022-10-12 ENCOUNTER — Telehealth (HOSPITAL_COMMUNITY): Payer: Self-pay | Admitting: *Deleted

## 2022-10-12 NOTE — Telephone Encounter (Signed)
Informed patient of battery status, patient voiced understanding

## 2022-10-12 NOTE — Telephone Encounter (Signed)
Called patient back and got his follow up appointment scheduled for next week.

## 2022-10-12 NOTE — Telephone Encounter (Signed)
Left vm for pt to return call to schedule ER f/u.

## 2022-10-16 ENCOUNTER — Other Ambulatory Visit: Payer: Self-pay | Admitting: Cardiology

## 2022-10-18 ENCOUNTER — Ambulatory Visit (HOSPITAL_COMMUNITY)
Admission: RE | Admit: 2022-10-18 | Discharge: 2022-10-18 | Disposition: A | Discharge status: Home / Self Care | Payer: Medicare PPO | Source: Ambulatory Visit | Attending: Cardiology | Admitting: Cardiology

## 2022-10-18 ENCOUNTER — Encounter (HOSPITAL_COMMUNITY): Payer: Self-pay | Admitting: Cardiology

## 2022-10-18 VITALS — BP 110/72 | HR 61 | Wt 219.6 lb

## 2022-10-18 DIAGNOSIS — I480 Paroxysmal atrial fibrillation: Secondary | ICD-10-CM | POA: Insufficient documentation

## 2022-10-18 DIAGNOSIS — I739 Peripheral vascular disease, unspecified: Secondary | ICD-10-CM | POA: Diagnosis not present

## 2022-10-18 DIAGNOSIS — G4733 Obstructive sleep apnea (adult) (pediatric): Secondary | ICD-10-CM | POA: Diagnosis not present

## 2022-10-18 DIAGNOSIS — I5022 Chronic systolic (congestive) heart failure: Secondary | ICD-10-CM | POA: Insufficient documentation

## 2022-10-18 DIAGNOSIS — N183 Chronic kidney disease, stage 3 unspecified: Secondary | ICD-10-CM | POA: Diagnosis not present

## 2022-10-18 DIAGNOSIS — Z7901 Long term (current) use of anticoagulants: Secondary | ICD-10-CM | POA: Diagnosis not present

## 2022-10-18 DIAGNOSIS — E785 Hyperlipidemia, unspecified: Secondary | ICD-10-CM | POA: Insufficient documentation

## 2022-10-18 DIAGNOSIS — Z79899 Other long term (current) drug therapy: Secondary | ICD-10-CM | POA: Diagnosis not present

## 2022-10-18 LAB — BASIC METABOLIC PANEL
Anion gap: 12 (ref 5–15)
BUN: 34 mg/dL — ABNORMAL HIGH (ref 8–23)
CO2: 25 mmol/L (ref 22–32)
Calcium: 8.9 mg/dL (ref 8.9–10.3)
Chloride: 103 mmol/L (ref 98–111)
Creatinine, Ser: 2.1 mg/dL — ABNORMAL HIGH (ref 0.61–1.24)
GFR, Estimated: 31 mL/min — ABNORMAL LOW (ref >60)
Glucose, Bld: 113 mg/dL — ABNORMAL HIGH (ref 70–99)
Potassium: 4 mmol/L (ref 3.5–5.1)
Sodium: 140 mmol/L (ref 135–145)

## 2022-10-18 LAB — LIPID PANEL
Cholesterol: 100 mg/dL (ref 0–200)
HDL: 24 mg/dL — ABNORMAL LOW (ref >40)
LDL Cholesterol: 41 mg/dL (ref 0–99)
Total CHOL/HDL Ratio: 4.2 RATIO
Triglycerides: 176 mg/dL — ABNORMAL HIGH (ref <150)
VLDL: 35 mg/dL (ref 0–40)

## 2022-10-18 LAB — BRAIN NATRIURETIC PEPTIDE: B Natriuretic Peptide: 266.3 pg/mL — ABNORMAL HIGH (ref 0.0–100.0)

## 2022-10-18 MED ORDER — CARVEDILOL 3.125 MG PO TABS: 3.1250 mg | ORAL_TABLET | Freq: Two times a day (BID) | ORAL | 3 refills | Status: DC

## 2022-10-18 MED ORDER — EMPAGLIFLOZIN 10 MG PO TABS: 10.0000 mg | ORAL_TABLET | Freq: Every day | ORAL | 11 refills | Status: DC

## 2022-10-18 NOTE — Patient Instructions (Signed)
CHANGE Jardiance to 10 mg daily.  START Carvedilol 3.125 mg Twice daily  Labs done today, your results will be available in MyChart, we will contact you for abnormal readings.  Repeat blood work in 1 week at [facility].  Your physician recommends that you schedule a follow-up appointment in: 6 weeks  If you have any questions or concerns before your next appointment please send [place] a message through [place] or call our office at [phone].    TO LEAVE A MESSAGE FOR THE NURSE SELECT OPTION 2, PLEASE LEAVE A MESSAGE INCLUDING: YOUR NAME DATE OF BIRTH CALL BACK NUMBER REASON FOR CALL**this is important as we prioritize the call backs  YOU WILL RECEIVE A CALL BACK THE SAME DAY AS LONG AS YOU CALL BEFORE 4:00 PM  At the [facility], you and your health needs are our priority. As part of our continuing mission to provide you with exceptional heart care, we have created designated Provider Care Teams. These Care Teams include your primary Cardiologist (physician) and Advanced Practice Providers (APPs- Physician Assistants and Nurse Practitioners) who all work together to provide you with the care you need, when you need it.   You may see any of the following providers on your designated Care Team at your next follow up: Dr Glori Bickers Dr Loralie Champagne Dr. Roxana Hires, NP Lyda Jester, [place] [facility] [place], [place] Forestine Na, NP Audry Riles, PharmD   Please be sure to bring in all your medications bottles to every appointment.

## 2022-10-18 NOTE — Addendum Note (Signed)
Encounter addended by: Larey Dresser, MD on: 10/18/2022 4:49 PM  Actions taken: Clinical Note Signed

## 2022-10-18 NOTE — Progress Notes (Addendum)
Patient ID: Chad Brown, male   DOB: 09-10-1941, 82 y.o.   MRN: [id number] PCP: Ginger Organ., MD HF Cardiologist: Aundra Dubin  82 y.o. with paroxysmal atrial fibrillation and chronic systolic CHF thought to be due to nonischemic cardiomyopathy presents for followup of CHF. He has a cardiac history dating back to 1998, when he was admitted with chest pain and had cardiac cath showing nonobstructive CAD. In 4/14, he was found to be in atrial fibrillation.  Echo showed EF 30-35%.  He had DCCV back to NSR.  He was back in atrial fibrillation in 1/16, and EF was low again on TEE at that time.   Again, he had DCCV.  In 5/16, cardiac MRI showed persistently low EF, so given LBBB, he had Medtronic CRT-D device placed.  He was back in atrial fibrillation in 6/16 and had TEE-guided DCCV again with EF now 15-20% on TEE.  Repeat echo in 2/17 showed EF 55-60%. Echo in 1/19 showed EF 45-50%, mild LV and RV dilation.   Echo in 1/20 showed EF 45-50%, diffuse hypokinesis.   Atrial fibrillation ablation was done in 2/21.  Echo in 2/21 showed EF 40-45%, PASP 50 mmHg, mild MR. Echo in 6/22 also showed EF 40-45%.   Patient was admitted in 1/24 after a syncopal episode.  He had gone to the gym and exercised and was standing talking to someone when he got lightheaded and passed out.  No arrhythmia on device interrogation.  He was hypotensive and orthostatic in the ER. Coreg and Entresto were stopped and Lasix was decreased to 20 mg bid.    Today he returns for HF follow up.  He is off Psychiatrist.  He is only taking Jardiance every other day.  He has not had anymore lightheadedness or falls. BP has been running higher.  He is short of breath with heavy activity like walking up hills.  No chest pain.  No orthopnea/PND.  Weight has been stable.   Medtronic device interrogation: >99% BiV pacing, stable thoracic impedance, 1 episode of atrial fibrillation for about 6 hrs (not recent).   ECG (personally reviewed):  a-paced, BiV paced  Labs (6/16): LDL 153 Labs (7/16): HCT 40, LFTs normal, LFTs normal Labs (9/16): TSH normal, K 4.3, creatinine 1.03, BNP 419 Labs (10/17): K 4.2, creatinine 1.29, LFTs normal Labs (11/17): hgb 8.9 Labs (6/19): BNP 625 Labs (10/19): K 4.7, creatinine 1.66 => 1.58, LFTs normal Labs (1/20): LDL 128 Labs (11/20): K 3.6, creatinine 1.62 Labs (1/21): K 3.8, creatinine 1.89, BNP 578, LDL 115, TSH normal, LFTs normal Labs (3/21): K 4.7, creatinine 2.12 Labs (1/22): K 3.3, creatinine 1.34 Labs (5/22): LDL 42 Labs (8/22): K 3.8, creatinine 1.45 Labs (1/24): K 4, creatinine 2.3  PMH: 1. Atrial fibrillation: Paroxysmal.  Diagnosed 4/14, DCCV 4/14 to NSR.  DCCV 1/16 to NSR.  DCCV 6/16 to NSR.  2. Chronic systolic CHF: Nonischemic cardiomyopathy.  LHC in 1998 with nonobstructive disease.  Cardiolite in 6/14 with no ischemia or infarction.  cMRI (5/16) with EF 34%, mildly dilated LV with diffuse HK worse in anterolateral wall, small punctate areas of LGE in anteroseptum and basal inferior wall (not CAD pattern).  TEE (6/16) with EF 15-20%.  He has Medtronic CRT-D device.  Echo (9/16) with EF 40%, moderate LV dilation, grade II diastolic dysfunction, normal RV size and systolic function, PASP 42 mmHg.  - Hypotension with Entresto.  - Echo (2/17) showed LV functional recovery, EF 55-60% with mild MR.    -  Echo (1/19): EF 45-50%, mild LV dilation, mild RV dilation - Echo (1/20): EF 45-50%, diffuse hypokinesis, moderate diastolic dysfunction, normal RV size and systolic function.  - Echo (2/21): EF 40-45%, PASP 50 mmHg, mild MR.  - Echo (6/22): EF 40-45%, moderate LV dilation, mild LVH, RV normal 3. Hyperlipidemia: Myalgias with atorvastatin, Livalo, and pravastatin.  4. CKD stage 3 5. OA: s/p THR.  6. H/o melanoma 7. Anemia 8. Diverticulosis 9. OSA: Uses CPAP.  10. Pulmonary nodules: Followed by Dr. Delton Coombes.  11. Pancreatic pseudocysts 12. PAD: ABIs (2/20) were 0.94 on right  and 1.03 on left.  13. Papillary thyroid carcinoma: s/p thyroidectomy 8/22.   SH: Married with 3 children, lives in [place], retired, quit smoking in 2001.    FH: Mother with MI  ROS: All systems reviewed and negative except as per HPI.  Current Outpatient Medications  Medication Sig Dispense Refill   calcitRIOL (ROCALTROL) 0.25 MCG capsule Take 1 capsule by mouth once a week.     calcium carbonate (TUMS - DOSED IN MG ELEMENTAL CALCIUM) 500 MG chewable tablet Chew 2 tablets by mouth 2 (two) times daily.     carvedilol (COREG) 3.125 MG tablet Take 1 tablet (3.125 mg total) by mouth 2 (two) times daily. 180 tablet 3   cholecalciferol (VITAMIN D3) 25 MCG (1000 UNIT) tablet Take 1,000 Units by mouth daily.     Cyanocobalamin (VITAMIN B 12 PO) Take 1 tablet by mouth daily.     dorzolamide-timolol (COSOPT) 22.3-6.8 MG/ML ophthalmic solution Place 1 drop into the right eye 2 (two) times daily.     escitalopram (LEXAPRO) 20 MG tablet Take 1 tablet (20 mg total) by mouth daily. 90 tablet 0   ferrous sulfate 325 (65 FE) MG tablet Take 325 mg by mouth daily with breakfast.     finasteride (PROSCAR) 5 MG tablet Take 1 tablet (5 mg total) by mouth daily. 30 tablet 2   FOLIC ACID PO Take 1 tablet by mouth daily.     furosemide (LASIX) 20 MG tablet Take 1 tablet (20 mg total) by mouth 2 (two) times daily. 60 tablet 0   gabapentin (NEURONTIN) 100 MG capsule Take 200 mg by mouth 2 (two) times daily.      latanoprost (XALATAN) 0.005 % ophthalmic solution Place 1 drop into the right eye at bedtime.     levothyroxine (SYNTHROID) 125 MCG tablet Take 1 tablet (125 mcg total) by mouth daily. 30 tablet 0   nitroGLYCERIN (NITROSTAT) 0.4 MG SL tablet Place 0.4 mg under the tongue every 5 (five) minutes as needed for chest pain.     Polyethyl Glycol-Propyl Glycol 0.4-0.3 % SOLN Place 1 drop into the left eye in the morning and at bedtime.     REPATHA SURECLICK 665 MG/ML SOAJ INJECT '140MG'$  (1ML) INTO SKIN EVERY  2 WEEKS 2 mL 11   RHOPRESSA 0.02 % SOLN Place 1 drop into the right eye at bedtime.     spironolactone (ALDACTONE) 25 MG tablet Take 25 mg by mouth daily. Pt takes 1/2 table 12.5 mg in the morning and 1/2 table 12.5 mg at night.     tamsulosin (FLOMAX) 0.4 MG CAPS Take 0.4 mg by mouth at bedtime.      XARELTO 15 MG TABS tablet TAKE (1) TABLET DAILY WITH SUPPER. 90 tablet 1   empagliflozin (JARDIANCE) 10 MG TABS tablet Take 1 tablet (10 mg total) by mouth daily. 30 tablet 11   No current facility-administered medications for this encounter.  BP 110/72   Pulse 61   Wt 99.6 kg (219 lb 9.6 oz)   SpO2 99%   BMI 35.44 kg/m  General: NAD Neck: No JVD, no thyromegaly or thyroid nodule.  Lungs: Clear to auscultation bilaterally with normal respiratory effort. CV: Nondisplaced PMI.  Heart regular S1/S2, no S3/S4, no murmur.  No peripheral edema.  No carotid bruit.  Normal pedal pulses.  Abdomen: Soft, nontender, no hepatosplenomegaly, no distention.  Skin: Intact without lesions or rashes.  Neurologic: Alert and oriented x 3.  Psych: Normal affect. Extremities: No clubbing or cyanosis.  HEENT: Normal.   Assessment/Plan: 1. Chronic systolic CHF: Suspect nonischemic cardiomyopathy.  EF 15-20% on 6/16 TEE.  Cardiac MRI in 5/16 showed a LGE pattern that was not suggestive of coronary disease (?myocarditis or infiltrative disease).  There may be a component of tachycardia-mediated cardiomyopathy.  However, the cardiac MRI appears to have been done when he was in NSR.  In 9/16, echo showed EF increased to 40%.  Echo in 1/20 showed EF 45-50%.  He has Medtronic CRT-D device.  Echo (2/21) with EF 40-45%.  Echo in 03/2021 EF 40-45%.  NYHA class II symptoms. Not volume overloaded by exam or Optivol. Orthostatic symptoms have limited GDMT.  - Increase Jardiance to 10 mg daily. BMET/BNP today and again in 10 days.  - Keep Lasix 20 mg bid.  - He will stay off Entresto.  - Restart Coreg at low dose 3.125 mg  bid.  - Continue spironolactone to 25 mg daily.      2. Atrial fibrillation: Now s/p atrial fibrillation ablation in 2/21 and off amiodarone.  Rare, short AF runs by device interrogation.    - Continue Xarelto 15 mg daily.  3. Hyperlipidemia: Continue Repatha, check lipids today.  4. OSA: Continue CPAP.   5. PAD: Minimal claudication.  Continue Repatha.  6. CKD: Stage 3.  Follows with nephrology in [place].  - Increase Jardiance to 10 mg daily as above.   Followup in 6 weeks with APP.   Loralie Champagne  10/18/2022

## 2022-10-26 ENCOUNTER — Other Ambulatory Visit (HOSPITAL_COMMUNITY)
Admission: RE | Admit: 2022-10-26 | Discharge: 2022-10-26 | Disposition: A | Discharge status: Home / Self Care | Payer: Medicare PPO | Source: Ambulatory Visit | Attending: Cardiology | Admitting: Cardiology

## 2022-10-26 DIAGNOSIS — I5022 Chronic systolic (congestive) heart failure: Secondary | ICD-10-CM

## 2022-10-26 LAB — BASIC METABOLIC PANEL
Anion gap: 8 (ref 5–15)
BUN: 28 mg/dL — ABNORMAL HIGH (ref 8–23)
CO2: 29 mmol/L (ref 22–32)
Calcium: 9 mg/dL (ref 8.9–10.3)
Chloride: 102 mmol/L (ref 98–111)
Creatinine, Ser: 2 mg/dL — ABNORMAL HIGH (ref 0.61–1.24)
GFR, Estimated: 33 mL/min — ABNORMAL LOW (ref >60)
Glucose, Bld: 119 mg/dL — ABNORMAL HIGH (ref 70–99)
Potassium: 4.3 mmol/L (ref 3.5–5.1)
Sodium: 139 mmol/L (ref 135–145)

## 2022-11-01 DIAGNOSIS — I10 Essential (primary) hypertension: Secondary | ICD-10-CM | POA: Diagnosis not present

## 2022-11-01 DIAGNOSIS — E785 Hyperlipidemia, unspecified: Secondary | ICD-10-CM | POA: Diagnosis not present

## 2022-11-01 DIAGNOSIS — E041 Nontoxic single thyroid nodule: Secondary | ICD-10-CM | POA: Diagnosis not present

## 2022-11-01 DIAGNOSIS — D649 Anemia, unspecified: Secondary | ICD-10-CM | POA: Diagnosis not present

## 2022-11-01 DIAGNOSIS — C73 Malignant neoplasm of thyroid gland: Secondary | ICD-10-CM | POA: Diagnosis not present

## 2022-11-01 DIAGNOSIS — N1832 Chronic kidney disease, stage 3b: Secondary | ICD-10-CM | POA: Diagnosis not present

## 2022-11-01 DIAGNOSIS — Z125 Encounter for screening for malignant neoplasm of prostate: Secondary | ICD-10-CM | POA: Diagnosis not present

## 2022-11-01 DIAGNOSIS — R7301 Impaired fasting glucose: Secondary | ICD-10-CM | POA: Diagnosis not present

## 2022-11-01 LAB — TSH: TSH: 3.15 (ref 0.41–5.90)

## 2022-11-02 LAB — LIPID PANEL
Cholesterol: 116 (ref 0–200)
HDL: 28 — AB (ref 35–70)
LDL Cholesterol: 48
Triglycerides: 202 — AB (ref 40–160)

## 2022-11-02 LAB — HEMOGLOBIN A1C: Hemoglobin A1C: 5.7

## 2022-11-08 DIAGNOSIS — D6869 Other thrombophilia: Secondary | ICD-10-CM | POA: Diagnosis not present

## 2022-11-08 DIAGNOSIS — N1832 Chronic kidney disease, stage 3b: Secondary | ICD-10-CM | POA: Diagnosis not present

## 2022-11-08 DIAGNOSIS — I130 Hypertensive heart and chronic kidney disease with heart failure and stage 1 through stage 4 chronic kidney disease, or unspecified chronic kidney disease: Secondary | ICD-10-CM | POA: Diagnosis not present

## 2022-11-08 DIAGNOSIS — Z1339 Encounter for screening examination for other mental health and behavioral disorders: Secondary | ICD-10-CM | POA: Diagnosis not present

## 2022-11-08 DIAGNOSIS — I5022 Chronic systolic (congestive) heart failure: Secondary | ICD-10-CM | POA: Diagnosis not present

## 2022-11-08 DIAGNOSIS — J432 Centrilobular emphysema: Secondary | ICD-10-CM | POA: Diagnosis not present

## 2022-11-08 DIAGNOSIS — J849 Interstitial pulmonary disease, unspecified: Secondary | ICD-10-CM | POA: Diagnosis not present

## 2022-11-08 DIAGNOSIS — I700 Atherosclerosis of aorta: Secondary | ICD-10-CM | POA: Diagnosis not present

## 2022-11-08 DIAGNOSIS — G720 Drug-induced myopathy: Secondary | ICD-10-CM | POA: Diagnosis not present

## 2022-11-08 DIAGNOSIS — Z1331 Encounter for screening for depression: Secondary | ICD-10-CM | POA: Diagnosis not present

## 2022-11-08 DIAGNOSIS — I4821 Permanent atrial fibrillation: Secondary | ICD-10-CM | POA: Diagnosis not present

## 2022-11-08 DIAGNOSIS — Z0000 Encounter for general adult medical examination without abnormal findings: Secondary | ICD-10-CM | POA: Diagnosis not present

## 2022-11-08 DIAGNOSIS — R82998 Other abnormal findings in urine: Secondary | ICD-10-CM | POA: Diagnosis not present

## 2022-11-09 ENCOUNTER — Encounter: Payer: Self-pay | Admitting: "Endocrinology

## 2022-11-09 ENCOUNTER — Ambulatory Visit: Payer: Medicare PPO | Admitting: "Endocrinology

## 2022-11-09 VITALS — BP 112/64 | HR 68 | Ht 66.0 in | Wt 217.4 lb

## 2022-11-09 DIAGNOSIS — E890 Postprocedural hypothyroidism: Secondary | ICD-10-CM | POA: Diagnosis not present

## 2022-11-09 DIAGNOSIS — C73 Malignant neoplasm of thyroid gland: Secondary | ICD-10-CM

## 2022-11-09 MED ORDER — LEVOTHYROXINE SODIUM 137 MCG PO TABS: 137.0000 ug | ORAL_TABLET | Freq: Every day | ORAL | 1 refills | Status: DC

## 2022-11-09 NOTE — Progress Notes (Unsigned)
Endocrinology Consult Note                                            11/09/2022, 12:20 PM   Subjective:    Patient ID: Chad Brown, male    DOB: 1941-08-24, PCP Ginger Organ., MD   Past Medical History:  Diagnosis Date   Adenomatous colon polyp    tubular   AICD (automatic cardioverter/defibrillator) present 03/08/2015   MDT CRTD dual pacemaker and defib   Anemia    iron deficient   Arthritis    "about all my joints; hands, knees, back" (03/08/2015)   Atherosclerosis    Cataract    left eye small   Cholelithiasis    gallstones   Chronic systolic CHF (congestive heart failure) ([place])    a. New dx 12/2012 ? NICM, may be r/t afib. b. Nuc 03/2013 - normal;  c. 03/2015 TEE EF 15-20%.   Colon polyp, hyperplastic 01/2015   removed precancerous lesions   Depression    Diverticulosis    Dysrhythmia    afib   GERD (gastroesophageal reflux disease)    Glaucoma    right eye   Hyperlipidemia    Hypertension    Melanoma of eye ([place]) 2000's   "right; it's never been biopsied"   Melanoma of lower leg ([place]) 2015   "left; right at my knee"   Myocardial infarction ([place]) 1998   OSA (obstructive sleep apnea) 01/04/2016   no longer tolerates cpap   Peripheral vision loss, right 2006   Persistent atrial fibrillation ([place])    a. Dx 12/2012, s/p TEE/DCCV 01/26/13. b. On Xarelto (CHA2DS2VASc = 3);  c. 03/2015 TEE (EF 15-20%, no LAA thrombus) and DCCV - amio increased to 200 mg bid.   Pneumonia    Urinary hesitancy due to benign prostatic hypertrophy    Past Surgical History:  Procedure Laterality Date   ATRIAL FIBRILLATION ABLATION N/A 11/03/2019   Procedure: ATRIAL FIBRILLATION ABLATION;  Surgeon: Thompson Grayer, MD;  Location: [facility];  Service: Cardiovascular;  Laterality: N/A;   BACK SURGERY     upper back, cannot turn neck well   [place] N/A 01/26/2013   Procedure: CARDIOVERSION;  Surgeon: Lelon Perla, MD;  Location: [facility];  Service: Cardiovascular;  Laterality: N/A;   CARDIOVERSION N/A 03/23/2015   Procedure: CARDIOVERSION;  Surgeon: Jerline Pain, MD;  Location: [facility];  Service: Cardiovascular;  Laterality: N/A;   CARDIOVERSION N/A 08/14/2017   Procedure: CARDIOVERSION;  Surgeon: Josue Hector, MD;  Location: [facility];  Service: Cardiovascular;  Laterality: N/A;   CATARACT EXTRACTION Right ~ 2006   COLONOSCOPY WITH PROPOFOL N/A 02/10/2015   Procedure: COLONOSCOPY WITH PROPOFOL;  Surgeon: Gatha Mayer, MD;  Location: [facility];  Service: Endoscopy;  Laterality: N/A;   COLONOSCOPY WITH PROPOFOL N/A 08/07/2016   Procedure: COLONOSCOPY WITH PROPOFOL;  Surgeon: Gatha Mayer, MD;  Location: [facility];  Service: Endoscopy;  Laterality: N/A;   ENTEROSCOPY N/A 08/17/2015   Procedure: ENTEROSCOPY;  Surgeon: Gatha Mayer, MD;  [facility];  Service: Endoscopy;  Laterality: N/A;   EP IMPLANTABLE DEVICE N/A 03/08/2015   MDT Hillery Aldo CRT-D for nonischemic CM by Dr Rayann Heman for primary prevention   GLAUCOMA SURGERY Right ~ 2006   "put 3 stents  in to drain fluid" (03/08/2015) not successful, sent to [facility] to try to get last stent out   [place] 08/07/2016   Procedure: HOT HEMOSTASIS (ARGON PLASMA COAGULATION/BICAP);  Surgeon: Gatha Mayer, MD;  Location: [facility];  Service: Endoscopy;  Laterality: N/A;   INCISION AND DRAINAGE ABSCESS POSTERIOR CERVICALSPINE  05/2012   JOINT REPLACEMENT     MELANOMA EXCISION Left 2015   "lower leg; right at my knee"   REFRACTIVE SURGERY Right ~ 2006 X 2   "twice; both done at [facility]" (03/08/2015   SURGERY SCROTAL / TESTICULAR Right 1990's   TEE WITHOUT CARDIOVERSION N/A 01/26/2013   Procedure: TRANSESOPHAGEAL ECHOCARDIOGRAM (TEE);  Surgeon: Lelon Perla, MD;  [facility];  Service: Cardiovascular;  Laterality: N/A;  [name] anes. /    TEE WITHOUT CARDIOVERSION N/A 10/05/2014   Procedure: TRANSESOPHAGEAL ECHOCARDIOGRAM (TEE)  with  cardioversion;  Surgeon: Thayer Headings, MD;  Location: [facility];  Service: Cardiovascular;  Laterality: N/A;  12:52 synched cardioversion at 120 joules,...afib to SR...12 lead EKG ordered.Marland KitchenMarland KitchenCardiozem d/c'ed per MD verbal order at SR   TEE WITHOUT CARDIOVERSION N/A 03/23/2015   Procedure: TRANSESOPHAGEAL ECHOCARDIOGRAM (TEE);  Surgeon: Jerline Pain, MD;  Location: [place] [facility];  Service: Cardiovascular;  Laterality: N/A;   TEE WITHOUT CARDIOVERSION N/A 11/02/2019   Procedure: TRANSESOPHAGEAL ECHOCARDIOGRAM (TEE);  Surgeon: Josue Hector, MD;  Location: [facility] ENDOSCOPY;  Service: Cardiovascular;  Laterality: N/A;   THORACIC SPINE SURGERY  03/2000   "ground calcium deposits from upper thoracic" (01/26/2013)   THYROIDECTOMY N/A 05/11/2021   Procedure: TOTAL THYROIDECTOMY;  Surgeon: Armandina Gemma, MD;  [facility]: [facility];  Service: General;  Laterality: N/A;   TOTAL HIP ARTHROPLASTY Right 06/2007   Social History   Socioeconomic History   Marital status: Married    Spouse name: Not on file   Number of children: 3   Years of education: Not on file   Highest education level: Not on file  Occupational History   Occupation: retired  Tobacco Use   Smoking status: Former    Packs/day: 3.00    Years: 48.00    Total pack years: 144.00    Types: Cigarettes    Quit date: 09/28/2000    Years since quitting: 22.1   Smokeless tobacco: Never  Vaping Use   Vaping Use: Never used  Substance and Sexual Activity   Alcohol use: No   Drug use: No   Sexual activity: Not Currently  Other Topics Concern   Not on file  Social History Narrative   Pt lives in [place] with spouse.  3 children are grown and healthy.   Retired.  Ran a country store for 30 years, previously worked in the [employer] for 16 years   Social Determinants of Radio broadcast assistant Strain: Not on [employer] Insecurity: Not on file  Transportation Needs: No Transportation Needs (11/03/2020)   PRAPARE - Armed forces logistics/support/administrative officer (Medical): No    Lack of Transportation (Non-Medical): No  Physical Activity: Inactive (11/03/2020)   Exercise Vital Sign    Days of Exercise per Week: 0 days    Minutes of Exercise per Session: 0 min  Stress: Not on file  Social Connections: Not on file   Family History  Problem Relation Age of Onset   Kidney disease Mother    Heart disease Mother        MI, open heart   Diabetes Mother  dialysis   Leukemia Father    Colon cancer Paternal Uncle    Lung cancer Paternal Uncle        x 2   Prostate cancer Paternal Uncle    Diabetes Maternal Grandmother    Heart attack Maternal Uncle    Diabetes Maternal Aunt        x 3   Diabetes Maternal Uncle    Thyroid disease Sister    Outpatient Encounter Medications as of 11/09/2022  Medication Sig   calcium carbonate (TUMS - DOSED IN MG ELEMENTAL CALCIUM) 500 MG chewable tablet Chew 2 tablets by mouth 2 (two) times daily.   carvedilol (COREG) 3.125 MG tablet Take 1 tablet (3.125 mg total) by mouth 2 (two) times daily.   cholecalciferol (VITAMIN D3) 25 MCG (1000 UNIT) tablet Take 1,000 Units by mouth daily.   Cyanocobalamin (VITAMIN B 12 PO) Take 1 tablet by mouth daily.   dorzolamide-timolol (COSOPT) 22.3-6.8 MG/ML ophthalmic solution Place 1 drop into the right eye 2 (two) times daily.   empagliflozin (JARDIANCE) 10 MG TABS tablet Take 1 tablet (10 mg total) by mouth daily.   escitalopram (LEXAPRO) 20 MG tablet Take 1 tablet (20 mg total) by mouth daily.   ferrous sulfate 325 (65 FE) MG tablet Take 325 mg by mouth daily with breakfast.   finasteride (PROSCAR) 5 MG tablet Take 1 tablet (5 mg total) by mouth daily.   FOLIC ACID PO Take 1 tablet by mouth daily.   furosemide (LASIX) 20 MG tablet Take 1 tablet (20 mg total) by mouth 2 (two) times daily.   gabapentin (NEURONTIN) 100 MG capsule Take 200 mg by mouth 2 (two) times daily.    latanoprost (XALATAN) 0.005 % ophthalmic solution Place 1 drop into the  right eye at bedtime.   levothyroxine (SYNTHROID) 137 MCG tablet Take 1 tablet (137 mcg total) by mouth daily before breakfast.   nitroGLYCERIN (NITROSTAT) 0.4 MG SL tablet Place 0.4 mg under the tongue every 5 (five) minutes as needed for chest pain.   Polyethyl Glycol-Propyl Glycol 0.4-0.3 % SOLN Place 1 drop into the left eye in the morning and at bedtime.   REPATHA SURECLICK [id number] MG/ML SOAJ INJECT 140MG (1ML) INTO SKIN EVERY 2 WEEKS   RHOPRESSA 0.02 % SOLN Place 1 drop into the right eye at bedtime.   spironolactone (ALDACTONE) 25 MG tablet Take 25 mg by mouth daily. Pt takes 1/2 table 12.5 mg in the morning and 1/2 table 12.5 mg at night.   tamsulosin (FLOMAX) 0.4 MG CAPS Take 0.4 mg by mouth at bedtime.    XARELTO 15 MG TABS tablet TAKE (1) TABLET DAILY WITH SUPPER.   [DISCONTINUED] levothyroxine (SYNTHROID) 125 MCG tablet Take 1 tablet (125 mcg total) by mouth daily.   No facility-administered encounter medications on file as of 11/09/2022.   ALLERGIES: Allergies  Allergen Reactions   Pravastatin Sodium Other (See Comments)    Joint and muscle pain   Azithromycin Diarrhea    VACCINATION STATUS: Immunization History  Administered Date(s) Administered   Fluad Quad(high Dose 65+) 06/10/2019   Influenza Inj Mdck Quad With Preservative 06/27/2018   Influenza, High Dose Seasonal PF 06/29/2016, 07/04/2017, 06/29/2020   Influenza, Seasonal, Injecte, Preservative Fre 08/02/2015   Influenza-Unspecified 07/11/2014, 07/18/2015   Moderna Sars-Covid-2 Vaccination 12/08/2019, 12/28/2019, 07/27/2020   Pneumococcal Conjugate-13 08/23/2014   Pneumococcal Polysaccharide-23 10/01/2010, 03/04/2018   Pneumococcal-Unspecified 06/02/2011   Td 10/01/2008   Td (Adult), 2 Lf Tetanus Toxid, Preservative Free 10/01/2008   Tdap  12/16/2014   Zoster Recombinat (Shingrix) 12/24/2017, 12/27/2017, 03/04/2018   Zoster, Live 10/01/2010, 08/01/2013, 08/20/2013    HPI Chad Brown is 82 y.o. male who  presents today with a medical history as above. he is being seen in consultation for postsurgical hypothyroidism and history of thyroid malignancy requested by Ginger Organ., MD.  Patient is accompanied by his wife to clinic. History is obtained from the patient as well as chart review. Accordingly, patient is status post total thyroidectomy on May 11, 2021 after workup showed papillary thyroid malignancy.  This was subsequently followed by Thyrogen stimulated thyroid remnant ablation with negative whole body scan completed on October 27, 2021.  Patient has been on regular dose of levothyroxine ever since.  He has no new complaints today.  He has recovered from his surgery completely.  His most recent thyroid function tests where attached showing free T4 low end of normal at 0.9, TSH at 3.15.   He denies family history of thyroid dysfunction or thyroid malignancy.  He denies any prior history of thyroid radiation exposure.  He denies dysphagia, shortness of breath, nor voice change.  He denies palpitations, tremors, nor heat/cold intolerance.  His other medical problems include hypertension, hyperlipidemia, coronary artery disease, CHF, on medications management.  His most recent hemoglobin A1c is 5.7% on November 01, 2022.    Review of Systems  Constitutional: + Minimally fluctuating body weight, no fatigue, no subjective hyperthermia, no subjective hypothermia Eyes: no blurry vision, no xerophthalmia ENT: no sore throat, no nodules palpated in throat, no dysphagia/odynophagia, no hoarseness Cardiovascular: no Chest Pain, no Shortness of Breath, no palpitations, no leg swelling Respiratory: no cough, no shortness of breath Gastrointestinal: no Nausea/Vomiting/Diarhhea Musculoskeletal: no muscle/joint aches Skin: no rashes Neurological: no tremors, no numbness, no tingling, no dizziness Psychiatric: no depression, no anxiety  Objective:       11/09/2022    8:42 AM 10/18/2022    9:07  AM 10/11/2022    8:28 AM  Vitals with BMI  Height 5' 6"$     Weight 217 lbs 6 oz 219 lbs 10 oz   BMI 0000000    Systolic [id number]  Diastolic 64 72 67  Pulse 68 61 67    BP 112/64   Pulse 68   Ht 5' 6"$  (1.676 m)   Wt 217 lb 6.4 oz (98.6 kg)   BMI 35.09 kg/m   Wt Readings from Last 3 Encounters:  11/09/22 217 lb 6.4 oz (98.6 kg)  10/18/22 219 lb 9.6 oz (99.6 kg)  10/02/22 217 lb 8 oz (98.7 kg)    Physical Exam  Constitutional:  Body mass index is 35.09 kg/m.,  not in acute distress, normal state of mind Eyes: PERRLA, EOMI, no exophthalmos ENT: moist mucous membranes, + healed post thyroidectomy scar on anterior lower neck,  no gross cervical lymphadenopathy Cardiovascular: normal precordial activity, Regular Rate and Rhythm, no Murmur/Rubs/Gallops Respiratory:  adequate breathing efforts, no gross chest deformity, Clear to auscultation bilaterally Gastrointestinal: abdomen soft, Non -tender, No distension, Bowel Sounds present, no gross organomegaly Musculoskeletal: no gross deformities, strength intact in all four extremities Skin: moist, warm, no rashes Neurological: no tremor with outstretched hands, Deep tendon reflexes normal in bilateral lower extremities.  CMP ( most recent) CMP     Component Value Date/Time   NA 139 10/26/2022 1009   NA 144 03/30/2020 0911   K 4.3 10/26/2022 1009   CL 102 10/26/2022 1009   CO2 29 10/26/2022 1009  GLUCOSE 119 (H) 10/26/2022 1009   BUN 28 (H) 10/26/2022 1009   BUN 19 03/30/2020 0911   CREATININE 2.00 (H) 10/26/2022 1009   CALCIUM 9.0 10/26/2022 1009   PROT 6.8 10/10/2022 1324   ALBUMIN 3.6 10/10/2022 1324   AST 17 10/10/2022 1324   ALT 21 10/10/2022 1324   ALKPHOS 52 10/10/2022 1324   BILITOT 0.4 10/10/2022 1324   GFRNONAA 33 (L) 10/26/2022 1009   GFRAA 38 (L) 04/29/2020 1034     Diabetic Labs (most recent): Lab Results  Component Value Date   HGBA1C 5.9 (H) 05/01/2021   HGBA1C 6.5 (H) 10/02/2014     Lipid Panel (  most recent) Lipid Panel     Component Value Date/Time   CHOL 100 10/18/2022 0949   TRIG 176 (H) 10/18/2022 0949   HDL 24 (L) 10/18/2022 0949   CHOLHDL 4.2 10/18/2022 0949   VLDL 35 10/18/2022 0949   LDLCALC 41 10/18/2022 0949      Lab Results  Component Value Date   TSH 9.19 (H) 08/02/2021   TSH 3.441 10/27/2019   TSH 3.135 10/22/2018   TSH 3.620 07/22/2018   TSH 1.746 08/21/2017   TSH 1.637 08/14/2016   TSH 1.124 06/07/2015   TSH 2.01 10/29/2014   TSH 1.616 01/22/2013   FREET4 0.64 08/02/2021   FREET4 0.93 10/29/2014     Assessment & Plan:   1. Papillary thyroid carcinoma ([place]) 2. Postsurgical hypothyroidism    BLAYDEN POLT  is being seen at a kind request of Brigitte Pulse Emily Filbert., MD. - I have reviewed his available thyroid records and clinically evaluated the patient. - Based on these reviews, he has history of papillary thyroid carcinoma status post total thyroidectomy, Thyrogen stimulated thyroid remnant ablation with negative whole-body scan completed prior to January 2024. His recent thyroid function tests are consistent will benefit from slight increase in his levothyroxine dose.  I discussed and increased his levothyroxine to 137 mcg p.o. daily before breakfast.    - We discussed about the correct intake of his thyroid hormone, on empty stomach at fasting, with water, separated by at least 30 minutes from breakfast and other medications,  and separated by more than 4 hours from calcium, iron, multivitamins, acid reflux medications (PPIs). -Patient is made aware of the fact that thyroid hormone replacement is needed for life, dose to be adjusted by periodic monitoring of thyroid function tests.  His next labs will include TSH, free T4, thyroglobulin/thyroglobulin antibodies and follow-up in 3 months.   I had a long discussion with him about the need for continued surveillance for cancer recurrence.  He will be considered for thyroid/neck ultrasound in a year.    He is advised to continue regular follow-up with his PMD for his controlled diabetes, hypertension and hyperlipidemia.   - he is advised to maintain close follow up with Ginger Organ., MD for primary care needs.   - Time spent with the patient: 40 minutes, of which >50% was spent in  counseling him about his papillary thyroid carcinoma, postsurgical hypothyroidism and the rest in obtaining information about his symptoms, reviewing his previous labs/studies ( including abstractions from other facilities),  evaluations, and treatments,  and developing a plan to confirm diagnosis and long term treatment based on the latest standards of care/guidelines; and documenting his care.  Chad Brown participated in the discussions, expressed understanding, and voiced agreement with the above plans.  All questions were answered to his satisfaction. he is  encouraged to contact clinic should he have any questions or concerns prior to his return visit.  Follow up plan: Return in about 6 months (around 05/10/2023) for F/U with Pre-visit Labs.   Glade Lloyd, MD [facility] [address] [place], [place] [address] Phone: [phone]  Fax: [phone]     11/09/2022, 12:20 PM  This note was partially dictated with voice recognition software. Similar sounding words can be transcribed inadequately or may not  be corrected upon review.

## 2022-11-12 ENCOUNTER — Ambulatory Visit: Payer: Medicare PPO | Attending: Cardiology

## 2022-11-12 DIAGNOSIS — I5022 Chronic systolic (congestive) heart failure: Secondary | ICD-10-CM | POA: Diagnosis not present

## 2022-11-12 DIAGNOSIS — Z95810 Presence of automatic (implantable) cardiac defibrillator: Secondary | ICD-10-CM

## 2022-11-14 DIAGNOSIS — N184 Chronic kidney disease, stage 4 (severe): Secondary | ICD-10-CM | POA: Diagnosis not present

## 2022-11-14 DIAGNOSIS — N178 Other acute kidney failure: Secondary | ICD-10-CM | POA: Diagnosis not present

## 2022-11-14 DIAGNOSIS — I5042 Chronic combined systolic (congestive) and diastolic (congestive) heart failure: Secondary | ICD-10-CM | POA: Diagnosis not present

## 2022-11-14 DIAGNOSIS — D638 Anemia in other chronic diseases classified elsewhere: Secondary | ICD-10-CM | POA: Diagnosis not present

## 2022-11-19 ENCOUNTER — Telehealth: Payer: Self-pay

## 2022-11-19 DIAGNOSIS — E211 Secondary hyperparathyroidism, not elsewhere classified: Secondary | ICD-10-CM | POA: Diagnosis not present

## 2022-11-19 DIAGNOSIS — D638 Anemia in other chronic diseases classified elsewhere: Secondary | ICD-10-CM | POA: Diagnosis not present

## 2022-11-19 DIAGNOSIS — I5042 Chronic combined systolic (congestive) and diastolic (congestive) heart failure: Secondary | ICD-10-CM | POA: Diagnosis not present

## 2022-11-19 DIAGNOSIS — N184 Chronic kidney disease, stage 4 (severe): Secondary | ICD-10-CM | POA: Diagnosis not present

## 2022-11-19 NOTE — Progress Notes (Signed)
EPIC Encounter for ICM Monitoring  Patient Name: Chad Brown is a 82 y.o. male Date: 11/19/2022 Primary Care Physican: Ginger Organ., MD Primary Cardiologist: Nishan/McLean Electrophysiologist: Cyndi Bender Pacing: 98.1%   07/24/2022 Weight 210 lbs 08/31/2022 Weight: 210 lbs 10/18/2022 Office Weight: 219 lbs   Clinical Status Since 18-Oct-2022 Time in AT/AF 0.0 hr/day (0.0%) (taking Xarelto)   Battery Longevity: 5 months      Attempted call to patient and unable to reach.  Left detailed message per DPR regarding transmission. Transmission reviewed.    Optivol thoracic impedance suggesting normal fluid levels.     Prescribed:  Furosemide 40 mg 1 tablet (40 mg total) by mouth in the morning and 0.5 tablet (20 mg total)  every evening.  20 mg  Spironolactone 25 mg Take 1 tablet (25 mg total) by mouth daily. Take at bedtime   Labs: 03/15/2022 Creatine 2.01, BUN 26, Potassium 4.5, Sodium 139, GFR 33 A complete set of results can be found in Results Review.   Recommendations:  Left voice mail with ICM number and encouraged to call if experiencing any fluid symptoms.   Follow-up plan: ICM clinic phone appointment on 12/17/2022.  91 day device clinic remote transmission 11/29/2022.     EP/Cardiology Next Office Visit:   Recall 12/02/2022 with Dr Curt Bears.   11/28/2022 with HF clinic.    Due to see Dr Johnsie Cancel 11/2022.    Copy of ICM check sent to Dr. Curt Bears.    3 month ICM trend: 11/12/2022.    12-14 Month ICM trend:     Rosalene Billings, RN 11/19/2022 2:19 PM

## 2022-11-19 NOTE — Telephone Encounter (Signed)
Remote ICM transmission received.  Attempted call to patient regarding ICM remote transmission and left detailed message per DPR.  Advised to return call for any fluid symptoms or questions. Next ICM remote transmission scheduled 12/17/2022.

## 2022-11-28 ENCOUNTER — Other Ambulatory Visit (HOSPITAL_COMMUNITY): Payer: Self-pay

## 2022-11-28 ENCOUNTER — Encounter: Payer: Self-pay | Admitting: Pharmacist

## 2022-11-28 ENCOUNTER — Encounter (HOSPITAL_COMMUNITY): Payer: Self-pay

## 2022-11-28 ENCOUNTER — Telehealth (HOSPITAL_COMMUNITY): Payer: Self-pay

## 2022-11-28 ENCOUNTER — Ambulatory Visit (HOSPITAL_COMMUNITY)
Admission: RE | Admit: 2022-11-28 | Discharge: 2022-11-28 | Disposition: A | Discharge status: Home / Self Care | Payer: Medicare PPO | Source: Ambulatory Visit | Attending: Family Medicine | Admitting: Family Medicine

## 2022-11-28 ENCOUNTER — Telehealth: Payer: Self-pay | Admitting: Pharmacist

## 2022-11-28 VITALS — BP 112/70 | HR 64 | Wt 216.4 lb

## 2022-11-28 DIAGNOSIS — Z8249 Family history of ischemic heart disease and other diseases of the circulatory system: Secondary | ICD-10-CM | POA: Insufficient documentation

## 2022-11-28 DIAGNOSIS — E785 Hyperlipidemia, unspecified: Secondary | ICD-10-CM | POA: Diagnosis not present

## 2022-11-28 DIAGNOSIS — I5022 Chronic systolic (congestive) heart failure: Secondary | ICD-10-CM | POA: Insufficient documentation

## 2022-11-28 DIAGNOSIS — I739 Peripheral vascular disease, unspecified: Secondary | ICD-10-CM | POA: Insufficient documentation

## 2022-11-28 DIAGNOSIS — Z95810 Presence of automatic (implantable) cardiac defibrillator: Secondary | ICD-10-CM | POA: Diagnosis not present

## 2022-11-28 DIAGNOSIS — G4733 Obstructive sleep apnea (adult) (pediatric): Secondary | ICD-10-CM | POA: Diagnosis not present

## 2022-11-28 DIAGNOSIS — E782 Mixed hyperlipidemia: Secondary | ICD-10-CM | POA: Diagnosis not present

## 2022-11-28 DIAGNOSIS — I2510 Atherosclerotic heart disease of native coronary artery without angina pectoris: Secondary | ICD-10-CM | POA: Diagnosis not present

## 2022-11-28 DIAGNOSIS — Z79899 Other long term (current) drug therapy: Secondary | ICD-10-CM | POA: Insufficient documentation

## 2022-11-28 DIAGNOSIS — N183 Chronic kidney disease, stage 3 unspecified: Secondary | ICD-10-CM | POA: Diagnosis not present

## 2022-11-28 DIAGNOSIS — I480 Paroxysmal atrial fibrillation: Secondary | ICD-10-CM | POA: Insufficient documentation

## 2022-11-28 DIAGNOSIS — Z7901 Long term (current) use of anticoagulants: Secondary | ICD-10-CM | POA: Diagnosis not present

## 2022-11-28 DIAGNOSIS — I4819 Other persistent atrial fibrillation: Secondary | ICD-10-CM | POA: Diagnosis not present

## 2022-11-28 DIAGNOSIS — Z87891 Personal history of nicotine dependence: Secondary | ICD-10-CM | POA: Diagnosis not present

## 2022-11-28 MED ORDER — REPATHA SURECLICK 140 MG/ML ~~LOC~~ SOAJ: SUBCUTANEOUS | 3 refills | Status: DC

## 2022-11-28 NOTE — Patient Instructions (Signed)
It was great to see you today! No medication changes are needed at this time.  Your physician wants you to follow-up in: 6 months (August 2024) with Dr Aundra Dubin and echo. You will receive a reminder letter in the mail two months in advance. If you don't receive a letter, please call our office to schedule the follow-up appointment in July 2024.  Your physician has requested that you have an echocardiogram. Echocardiography is a painless test that uses sound waves to create images of your heart. It provides your doctor with information about the size and shape of your heart and how well your heart's chambers and valves are working. This procedure takes approximately one hour. There are no restrictions for this procedure. Please do NOT wear cologne, perfume, aftershave, or lotions (deodorant is allowed). Please arrive 15 minutes prior to your appointment time.  Do the following things EVERYDAY: Weigh yourself in the morning before breakfast. Write it down and keep it in a log. Take your medicines as prescribed Eat low salt foods--Limit salt (sodium) to 2000 mg per day.  Stay as active as you can everyday Limit all fluids for the day to less than 2 liters  At the [facility], you and your health needs are our priority. As part of our continuing mission to provide you with exceptional heart care, we have created designated Provider Care Teams. These Care Teams include your primary Cardiologist (physician) and Advanced Practice Providers (APPs- Physician Assistants and Nurse Practitioners) who all work together to provide you with the care you need, when you need it.   You may see any of the following providers on your designated Care Team at your next follow up: Dr Glori Bickers Dr Loralie Champagne Dr. Roxana Hires, NP Lyda Jester, [place] [facility] [place], [place] Forestine Na, NP Audry Riles, PharmD   Please be sure to bring in all your medications  bottles to every appointment.    Thank you for choosing [facility]   If you have any questions or concerns before your next appointment please send [place] a message through [place] or call our office at [phone].    TO LEAVE A MESSAGE FOR THE NURSE SELECT OPTION 2, PLEASE LEAVE A MESSAGE INCLUDING: YOUR NAME DATE OF BIRTH CALL BACK NUMBER REASON FOR CALL**this is important as we prioritize the call backs  YOU WILL RECEIVE A CALL BACK THE SAME DAY AS LONG AS YOU CALL BEFORE 4:00 PM

## 2022-11-28 NOTE — Progress Notes (Signed)
Patient ID: Chad Brown, male   DOB: [birth year], 82 y.o.   MRN: [id number] PCP: Ginger Organ., MD HF Cardiologist: Aundra Dubin  82 y.o. with paroxysmal atrial fibrillation and chronic systolic CHF thought to be due to nonischemic cardiomyopathy presents for followup of CHF. He has a cardiac history dating back to 1998, when he was admitted with chest pain and had cardiac cath showing nonobstructive CAD. In 4/14, he was found to be in atrial fibrillation.  Echo showed EF 30-35%.  He had DCCV back to NSR.  He was back in atrial fibrillation in 1/16, and EF was low again on TEE at that time.   Again, he had DCCV.  In 5/16, cardiac MRI showed persistently low EF, so given LBBB, he had Medtronic CRT-D device placed.  He was back in atrial fibrillation in 6/16 and had TEE-guided DCCV again with EF now 15-20% on TEE.  Repeat echo in 2/17 showed EF 55-60%. Echo in 1/19 showed EF 45-50%, mild LV and RV dilation.   Echo in 1/20 showed EF 45-50%, diffuse hypokinesis.   Atrial fibrillation ablation was done in 2/21.  Echo in 2/21 showed EF 40-45%, PASP 50 mmHg, mild MR. Echo in 6/22 also showed EF 40-45%.   Patient was admitted in 1/24 after a syncopal episode.  He had gone to the gym and exercised and was standing talking to someone when he got lightheaded and passed out.  No arrhythmia on device interrogation.  He was hypotensive and orthostatic in the ER. Coreg and Entresto were stopped and Lasix was decreased to 20 mg bid.    Follow up 1/24, NYHA II and volume stable. Had been off Entresto and Coreg, taking Jardiance qod. Jardiance increased to daily, Coreg restarted at lower dose of 3.125 and remained off Entresto.   Today he returns for HF follow up with his wife. Overall feeling fine. He has dyspnea walking up steps but otherwise doing well. He works out 3x/week doing [employer]. No further dizziness or syncope. Denies palpitations, CP, dizziness, edema, or PND/Orthopnea. Appetite ok. No fever or  chills. Weight at home 210-212 pounds. Taking all medications. BP at home 120-140's/70-80s. Not wearing CPAP  Medtronic device interrogation: Stable thoracic impedance, 1-2 hrs/day activity, 1 short AF episode 10/2022 (lasted ~6 hrs), no VT  ECG (personally reviewed): none ordered today.  Labs (10/19): K 4.7, creatinine 1.66 => 1.58, LFTs normal Labs (1/20): LDL 128 Labs (11/20): K 3.6, creatinine 1.62 Labs (1/21): K 3.8, creatinine 1.89, BNP 578, LDL 115, TSH normal, LFTs normal Labs (3/21): K 4.7, creatinine 2.12 Labs (1/22): K 3.3, creatinine 1.34 Labs (5/22): LDL 42 Labs (8/22): K 3.8, creatinine 1.45 Labs (1/24): K 4, creatinine 2.3 Labs (2/24): K 5.1, creatinine 2.23, LDL 48  PMH: 1. Atrial fibrillation: Paroxysmal.  Diagnosed 4/14, DCCV 4/14 to NSR.  DCCV 1/16 to NSR.  DCCV 6/16 to NSR.  2. Chronic systolic CHF: Nonischemic cardiomyopathy.  LHC in 1998 with nonobstructive disease.  Cardiolite in 6/14 with no ischemia or infarction.  cMRI (5/16) with EF 34%, mildly dilated LV with diffuse HK worse in anterolateral wall, small punctate areas of LGE in anteroseptum and basal inferior wall (not CAD pattern).  TEE (6/16) with EF 15-20%.  He has Medtronic CRT-D device.  Echo (9/16) with EF 40%, moderate LV dilation, grade II diastolic dysfunction, normal RV size and systolic function, PASP 42 mmHg.  - Hypotension with Entresto.  - Echo (2/17) showed LV functional recovery, EF 55-60% with mild  MR.    - Echo (1/19): EF 45-50%, mild LV dilation, mild RV dilation - Echo (1/20): EF 45-50%, diffuse hypokinesis, moderate diastolic dysfunction, normal RV size and systolic function.  - Echo (2/21): EF 40-45%, PASP 50 mmHg, mild MR.  - Echo (6/22): EF 40-45%, moderate LV dilation, mild LVH, RV normal 3. Hyperlipidemia: Myalgias with atorvastatin, Livalo, and pravastatin.  4. CKD stage 3 5. OA: s/p THR.  6. H/o melanoma 7. Anemia 8. Diverticulosis 9. OSA: Uses CPAP.  10. Pulmonary nodules:  Followed by Dr. Delton Coombes.  11. Pancreatic pseudocysts 12. PAD: ABIs (2/20) were 0.94 on right and 1.03 on left.  13. Papillary thyroid carcinoma: s/p thyroidectomy 8/22.   SH: Married with 3 children, lives in [place], retired, quit smoking in 2001.    FH: Mother with MI  ROS: All systems reviewed and negative except as per HPI.  Current Outpatient Medications  Medication Sig Dispense Refill   calcium carbonate (TUMS - DOSED IN MG ELEMENTAL CALCIUM) 500 MG chewable tablet Chew 2 tablets by mouth 2 (two) times daily.     carvedilol (COREG) 3.125 MG tablet Take 1 tablet (3.125 mg total) by mouth 2 (two) times daily. 180 tablet 3   cholecalciferol (VITAMIN D3) 25 MCG (1000 UNIT) tablet Take 1,000 Units by mouth daily.     Cyanocobalamin (VITAMIN B 12 PO) Take 1 tablet by mouth daily.     dorzolamide-timolol (COSOPT) 22.3-6.8 MG/ML ophthalmic solution Place 1 drop into the right eye 2 (two) times daily.     empagliflozin (JARDIANCE) 10 MG TABS tablet Take 1 tablet (10 mg total) by mouth daily. 30 tablet 11   escitalopram (LEXAPRO) 20 MG tablet Take 1 tablet (20 mg total) by mouth daily. 90 tablet 0   ferrous sulfate 325 (65 FE) MG tablet Take 325 mg by mouth daily with breakfast.     finasteride (PROSCAR) 5 MG tablet Take 1 tablet (5 mg total) by mouth daily. 30 tablet 2   FOLIC ACID PO Take 1 tablet by mouth daily.     furosemide (LASIX) 20 MG tablet Take 1 tablet (20 mg total) by mouth 2 (two) times daily. 60 tablet 0   gabapentin (NEURONTIN) 100 MG capsule Take 200 mg by mouth 2 (two) times daily.      latanoprost (XALATAN) 0.005 % ophthalmic solution Place 1 drop into the right eye at bedtime.     levothyroxine (SYNTHROID) 137 MCG tablet Take 1 tablet (137 mcg total) by mouth daily before breakfast. 90 tablet 1   nitroGLYCERIN (NITROSTAT) 0.4 MG SL tablet Place 0.4 mg under the tongue every 5 (five) minutes as needed for chest pain.     Polyethyl Glycol-Propyl Glycol 0.4-0.3 %  SOLN Place 1 drop into the left eye in the morning and at bedtime.     REPATHA SURECLICK [id number] MG/ML SOAJ INJECT '140MG'$  (1ML) INTO SKIN EVERY 2 WEEKS 2 mL 11   RHOPRESSA 0.02 % SOLN Place 1 drop into the right eye at bedtime.     spironolactone (ALDACTONE) 25 MG tablet Take 25 mg by mouth daily. Pt takes 1/2 table 12.5 mg in the morning and 1/2 table 12.5 mg at night.     tamsulosin (FLOMAX) 0.4 MG CAPS Take 0.4 mg by mouth at bedtime.      XARELTO 15 MG TABS tablet TAKE (1) TABLET DAILY WITH SUPPER. 90 tablet 1   No current facility-administered medications for this encounter.   Wt Readings from Last 3 Encounters:  11/28/22  98.2 kg (216 lb 6.4 oz)  11/09/22 98.6 kg (217 lb 6.4 oz)  10/18/22 99.6 kg (219 lb 9.6 oz)   BP 112/70   Pulse 64   Wt 98.2 kg (216 lb 6.4 oz)   SpO2 97%   BMI 34.93 kg/m  Physical Exam General:  NAD. No resp difficulty, walked into clinic HEENT: Normal Neck: Supple. No JVD, thick neck. Carotids 2+ bilat; no bruits. No lymphadenopathy or thryomegaly appreciated. Cor: PMI nondisplaced. Regular rate & rhythm. No rubs, gallops or murmurs. Lungs: Clear Abdomen: Soft, obese, nontender, nondistended. No hepatosplenomegaly. No bruits or masses. Good bowel sounds. Extremities: No cyanosis, clubbing, rash, edema Neuro: Alert & oriented x 3, cranial nerves grossly intact. Moves all 4 extremities w/o difficulty. Affect pleasant.  Assessment/Plan: 1. Chronic systolic CHF: Suspect nonischemic cardiomyopathy.  EF 15-20% on 6/16 TEE.  Cardiac MRI in 5/16 showed a LGE pattern that was not suggestive of coronary disease (?myocarditis or infiltrative disease).  There may be a component of tachycardia-mediated cardiomyopathy.  However, the cardiac MRI appears to have been done when he was in NSR.  In 9/16, echo showed EF increased to 40%.  Echo in 1/20 showed EF 45-50%.  He has Medtronic CRT-D device.  Echo (2/21) with EF 40-45%.  Echo in 03/2021 EF 40-45%.  NYHA class II symptoms. Not  volume overloaded by exam or Optivol. Orthostatic symptoms have limited GDMT.  - Continue Jardiance 10 mg daily. Recent labs reviewed and stable, K 5.1, SCr 2.23 - Continue Lasix 20 mg bid.  - Continue Coreg at low dose 3.125 mg bid.  - Continue spironolactone 25 mg daily.  Recent labs reviewd  K 5.1, SCr 2.23 (11/14/22) - He will stay off Entresto. Will not restart with recent orthostasis and CKD. 2. Atrial fibrillation: Now s/p atrial fibrillation ablation in 2/21 and off amiodarone.  Rare, short AF runs by device interrogation.    - Continue Xarelto 15 mg daily. No bleeding issues. Hgb 12.1 on labs 11/14/22 3. Hyperlipidemia: Continue Repatha, good lipids 2/24.  4. OSA: Encouraged compliance with CPAP.   5. PAD: Minimal claudication.  Continue Repatha.  6. CKD: Stage 3.  Follows with nephrology in [place].  - Continue SGLT2i.   Followup in 6 months with Dr. Aundra Dubin, with repeat echo to ensure EF stable.  Maricela Bo [facility] FNP-BC 11/28/2022

## 2022-11-28 NOTE — Telephone Encounter (Signed)
Pt previously in [employer] for his [place], looks like this expired. I have renewed his grant and sent him updated info to show his pharmacy to bring his copay back to $0 for the next year.  CARD # [id number]   BIN [id number]   PCN [id number]   GROUP [id number]

## 2022-11-28 NOTE — Telephone Encounter (Signed)
-----   Message from Rafael Bihari, [place] sent at 11/28/2022 10:56 AM EST ----- Regarding: Repatha [place]t!  I saw Mr. Jepson today in AHF clinic, and he had questions about a [place]. Says it's $40 to pick up med, but was under impression he would be getting or continuing a grant for it?  Can you help?  Thanks! Janett Billow

## 2022-11-28 NOTE — Telephone Encounter (Signed)
Pharmacy Patient Advocate Encounter   Prior authorization requested for Xarelto  PA submitted on 11/28/22 to [facility] via [place], CPhT Rx Patient Advocate Phone: [phone]

## 2022-11-29 ENCOUNTER — Ambulatory Visit: Payer: Medicare PPO

## 2022-11-29 ENCOUNTER — Other Ambulatory Visit (HOSPITAL_COMMUNITY): Payer: Self-pay

## 2022-11-29 DIAGNOSIS — I426 Alcoholic cardiomyopathy: Secondary | ICD-10-CM

## 2022-11-29 LAB — CUP PACEART REMOTE DEVICE CHECK
Battery Remaining Longevity: 5 mo
Battery Voltage: 2.81 V
Brady Statistic AP VP Percent: 95.24 %
Brady Statistic AP VS Percent: 0.19 %
Brady Statistic AS VP Percent: 4.45 %
Brady Statistic AS VS Percent: 0.12 %
Brady Statistic RA Percent Paced: 94.72 %
Brady Statistic RV Percent Paced: 99.08 %
Date Time Interrogation Session: 20240229022706
HighPow Impedance: 79 Ohm
Implantable Lead Connection Status: 753985
Implantable Lead Connection Status: 753985
Implantable Lead Connection Status: 753985
Implantable Lead Implant Date: 20160607
Implantable Lead Implant Date: 20160607
Implantable Lead Implant Date: 20160607
Implantable Lead Location: 753858
Implantable Lead Location: 753859
Implantable Lead Location: 753860
Implantable Lead Model: 4598
Implantable Lead Model: 5076
Implantable Pulse Generator Implant Date: 20160607
Lead Channel Impedance Value: 1083 Ohm
Lead Channel Impedance Value: 1140 Ohm
Lead Channel Impedance Value: 1311 Ohm
Lead Channel Impedance Value: 1311 Ohm
Lead Channel Impedance Value: 1482 Ohm
Lead Channel Impedance Value: 1596 Ohm
Lead Channel Impedance Value: 342 Ohm
Lead Channel Impedance Value: 494 Ohm
Lead Channel Impedance Value: 513 Ohm
Lead Channel Impedance Value: 589 Ohm
Lead Channel Impedance Value: 722 Ohm
Lead Channel Impedance Value: 893 Ohm
Lead Channel Impedance Value: 912 Ohm
Lead Channel Pacing Threshold Amplitude: 0.5 V
Lead Channel Pacing Threshold Amplitude: 0.5 V
Lead Channel Pacing Threshold Amplitude: 1.75 V
Lead Channel Pacing Threshold Pulse Width: 0.4 ms
Lead Channel Pacing Threshold Pulse Width: 0.4 ms
Lead Channel Pacing Threshold Pulse Width: 0.4 ms
Lead Channel Sensing Intrinsic Amplitude: 1.75 mV
Lead Channel Sensing Intrinsic Amplitude: 1.75 mV
Lead Channel Sensing Intrinsic Amplitude: 13.125 mV
Lead Channel Sensing Intrinsic Amplitude: 13.125 mV
Lead Channel Setting Pacing Amplitude: 1.5 V
Lead Channel Setting Pacing Amplitude: 2 V
Lead Channel Setting Pacing Amplitude: 2.25 V
Lead Channel Setting Pacing Pulse Width: 0.4 ms
Lead Channel Setting Pacing Pulse Width: 0.4 ms
Lead Channel Setting Sensing Sensitivity: 0.3 mV
Zone Setting Status: 755011
Zone Setting Status: 755011

## 2022-11-29 NOTE — Telephone Encounter (Signed)
Advanced Heart Failure Patient Advocate Encounter  Prior Authorization for Xarelto '15MG'$  has been approved by [facility].   Effective: 11/29/22 through 10/01/23

## 2022-12-17 ENCOUNTER — Ambulatory Visit: Payer: Medicare PPO | Attending: Cardiology

## 2022-12-17 DIAGNOSIS — I5022 Chronic systolic (congestive) heart failure: Secondary | ICD-10-CM | POA: Diagnosis not present

## 2022-12-17 DIAGNOSIS — Z95810 Presence of automatic (implantable) cardiac defibrillator: Secondary | ICD-10-CM | POA: Diagnosis not present

## 2022-12-19 NOTE — Progress Notes (Signed)
EPIC Encounter for ICM Monitoring  Patient Name: Chad Brown is a 82 y.o. male Date: 12/19/2022 Primary Care Physican: Ginger Organ., MD Primary Cardiologist: Nishan/McLean Electrophysiologist: Cyndi Bender Pacing: 99.3%   07/24/2022 Weight 210 lbs 08/31/2022 Weight: 210 lbs 10/18/2022 Office Weight: 219 lbs 12/19/2022 Weight: 210-211 lbs   Clinical Status Since 18-Oct-2022 Time in AT/AF 0.0 hr/day (0.0%) (taking Xarelto)   Battery Longevity: 4 months      Spoke with patient and heart failure questions reviewed.  Transmission results reviewed.  Pt asymptomatic for fluid accumulation.  Reports feeling well at this time and voices no complaints.     Optivol thoracic impedance suggesting normal fluid levels.     Prescribed:  Furosemide 40 mg 1 tablet (40 mg total) by mouth in the morning and 0.5 tablet (20 mg total)  every evening.  20 mg  Spironolactone 25 mg Take 1 tablet (25 mg total) by mouth daily. Take at bedtime   Labs: 03/15/2022 Creatine 2.01, BUN 26, Potassium 4.5, Sodium 139, GFR 33 A complete set of results can be found in Results Review.   Recommendations:  No changes and encouraged to call if experiencing any fluid symptoms.   Follow-up plan: ICM clinic phone appointment on 01/21/2023.  91 day device clinic remote transmission 02/28/2023.     EP/Cardiology Next Office Visit:  Provided EP scheduler number to call for appt.  Recall 12/02/2022 with Dr Curt Bears.   Recall 05/28/2023 with Dr Aundra Dubin.    Due to see Dr Johnsie Cancel 11/2022.    Copy of ICM check sent to Dr. Curt Bears.     3 month ICM trend: 12/17/2022.    12-14 Month ICM trend:     Rosalene Billings, RN 12/19/2022 2:24 PM

## 2022-12-27 DIAGNOSIS — Z961 Presence of intraocular lens: Secondary | ICD-10-CM | POA: Diagnosis not present

## 2022-12-27 DIAGNOSIS — H401113 Primary open-angle glaucoma, right eye, severe stage: Secondary | ICD-10-CM | POA: Diagnosis not present

## 2022-12-27 DIAGNOSIS — H2512 Age-related nuclear cataract, left eye: Secondary | ICD-10-CM | POA: Diagnosis not present

## 2022-12-27 DIAGNOSIS — D3191 Benign neoplasm of unspecified part of right eye: Secondary | ICD-10-CM | POA: Diagnosis not present

## 2022-12-28 ENCOUNTER — Ambulatory Visit (INDEPENDENT_AMBULATORY_CARE_PROVIDER_SITE_OTHER): Payer: Medicare PPO

## 2022-12-28 DIAGNOSIS — I426 Alcoholic cardiomyopathy: Secondary | ICD-10-CM

## 2022-12-28 LAB — CUP PACEART REMOTE DEVICE CHECK
Battery Remaining Longevity: 4 mo
Battery Voltage: 2.8 V
Brady Statistic AP VP Percent: 92.87 %
Brady Statistic AP VS Percent: 0.15 %
Brady Statistic AS VP Percent: 6.85 %
Brady Statistic AS VS Percent: 0.12 %
Brady Statistic RA Percent Paced: 92.35 %
Brady Statistic RV Percent Paced: 99.16 %
Date Time Interrogation Session: 20240329032204
HighPow Impedance: 86 Ohm
Implantable Lead Connection Status: 753985
Implantable Lead Connection Status: 753985
Implantable Lead Connection Status: 753985
Implantable Lead Implant Date: 20160607
Implantable Lead Implant Date: 20160607
Implantable Lead Implant Date: 20160607
Implantable Lead Location: 753858
Implantable Lead Location: 753859
Implantable Lead Location: 753860
Implantable Lead Model: 4598
Implantable Lead Model: 5076
Implantable Pulse Generator Implant Date: 20160607
Lead Channel Impedance Value: 1045 Ohm
Lead Channel Impedance Value: 1083 Ohm
Lead Channel Impedance Value: 1292 Ohm
Lead Channel Impedance Value: 1349 Ohm
Lead Channel Impedance Value: 1463 Ohm
Lead Channel Impedance Value: 1615 Ohm
Lead Channel Impedance Value: 380 Ohm
Lead Channel Impedance Value: 456 Ohm
Lead Channel Impedance Value: 570 Ohm
Lead Channel Impedance Value: 627 Ohm
Lead Channel Impedance Value: 665 Ohm
Lead Channel Impedance Value: 893 Ohm
Lead Channel Impedance Value: 912 Ohm
Lead Channel Pacing Threshold Amplitude: 0.375 V
Lead Channel Pacing Threshold Amplitude: 0.5 V
Lead Channel Pacing Threshold Amplitude: 1.75 V
Lead Channel Pacing Threshold Pulse Width: 0.4 ms
Lead Channel Pacing Threshold Pulse Width: 0.4 ms
Lead Channel Pacing Threshold Pulse Width: 0.4 ms
Lead Channel Sensing Intrinsic Amplitude: 1.75 mV
Lead Channel Sensing Intrinsic Amplitude: 1.75 mV
Lead Channel Sensing Intrinsic Amplitude: 13.75 mV
Lead Channel Sensing Intrinsic Amplitude: 13.75 mV
Lead Channel Setting Pacing Amplitude: 1.5 V
Lead Channel Setting Pacing Amplitude: 2 V
Lead Channel Setting Pacing Amplitude: 2.25 V
Lead Channel Setting Pacing Pulse Width: 0.4 ms
Lead Channel Setting Pacing Pulse Width: 0.4 ms
Lead Channel Setting Sensing Sensitivity: 0.3 mV
Zone Setting Status: 755011
Zone Setting Status: 755011

## 2022-12-31 NOTE — Progress Notes (Signed)
Remote ICD transmission.   

## 2023-01-21 ENCOUNTER — Ambulatory Visit: Payer: Medicare PPO | Attending: Cardiology

## 2023-01-21 ENCOUNTER — Encounter: Payer: Self-pay | Admitting: Cardiology

## 2023-01-21 ENCOUNTER — Ambulatory Visit: Payer: Medicare PPO | Admitting: Cardiology

## 2023-01-21 VITALS — BP 120/62 | HR 70 | Ht 66.0 in | Wt 216.0 lb

## 2023-01-21 DIAGNOSIS — I4819 Other persistent atrial fibrillation: Secondary | ICD-10-CM

## 2023-01-21 DIAGNOSIS — I5022 Chronic systolic (congestive) heart failure: Secondary | ICD-10-CM

## 2023-01-21 DIAGNOSIS — Z95810 Presence of automatic (implantable) cardiac defibrillator: Secondary | ICD-10-CM | POA: Diagnosis not present

## 2023-01-21 DIAGNOSIS — D6869 Other thrombophilia: Secondary | ICD-10-CM

## 2023-01-21 LAB — CUP PACEART INCLINIC DEVICE CHECK
Date Time Interrogation Session: 20240422164817
Implantable Lead Connection Status: 753985
Implantable Lead Connection Status: 753985
Implantable Lead Connection Status: 753985
Implantable Lead Implant Date: 20160607
Implantable Lead Implant Date: 20160607
Implantable Lead Implant Date: 20160607
Implantable Lead Location: 753858
Implantable Lead Location: 753859
Implantable Lead Location: 753860
Implantable Lead Model: 4598
Implantable Lead Model: 5076
Implantable Pulse Generator Implant Date: 20160607

## 2023-01-21 NOTE — Patient Instructions (Signed)
Medication Instructions:  Your physician recommends that you continue on your current medications as directed. Please refer to the Current Medication list given to you today.  *If you need a refill on your cardiac medications before your next appointment, please call your pharmacy*   Lab Work: None ordered   Testing/Procedures: None ordered   Follow-Up: At [facility], you and your health needs are our priority.  As part of our continuing mission to provide you with exceptional heart care, we have created designated Provider Care Teams.  These Care Teams include your primary Cardiologist (physician) and Advanced Practice Providers (APPs -  Physician Assistants and Nurse Practitioners) who all work together to provide you with the care you need, when you need it.  Your next appointment:   To be  determined   (you device battery is nearing replacement time.  We will call you to schedule a battery change once it is time)  The format for your next appointment:   In Person  Provider:   Loman Brooklyn, MD    Thank you for choosing [facility]!!   Dory Horn, RN [phone]

## 2023-01-21 NOTE — Progress Notes (Signed)
Chad Brown   Date:  01/21/2023   ID:  Chad Brown, DOB [birth year], MRN [id number]  PCP:  Chad Brown., MD  Cardiologist:  Shirlee Latch Primary Electrophysiologist:  Chad Brown Chad Loa, MD    Chief Complaint: ICD, AF   History of Present Illness: Chad Brown is a 82 y.o. male who is being seen today for the evaluation of ICD, AF at the request of Chad Brown., MD. Presenting today for Chad evaluation.  He has a history significant for atrial fibrillation and chronic systolic heart failure due to nonischemic cardiomyopathy.  He had an atrial fibrillation ablation February 2021.  He is status post Medtronic CRT-D.  Today, he denies symptoms of palpitations, chest pain, shortness of breath, orthopnea, PND, lower extremity edema, claudication, dizziness, presyncope, syncope, bleeding, or neurologic sequela. The patient is tolerating medications without difficulties.    Past Medical History:  Diagnosis Date   Adenomatous colon polyp    tubular   AICD (automatic cardioverter/defibrillator) present 03/08/2015   MDT CRTD dual pacemaker and defib   Anemia    iron deficient   Arthritis    "about all my joints; hands, knees, back" (03/08/2015)   Atherosclerosis    Cataract    left eye small   Cholelithiasis    gallstones   Chronic systolic CHF (congestive heart failure)    a. New dx 12/2012 ? NICM, may be r/t afib. b. Nuc 03/2013 - normal;  c. 03/2015 TEE EF 15-20%.   Colon polyp, hyperplastic 01/2015   removed precancerous lesions   Depression    Diverticulosis    Dysrhythmia    afib   GERD (gastroesophageal reflux disease)    Glaucoma    right eye   Hyperlipidemia    Hypertension    Melanoma of eye 2000's   "right; it's never been biopsied"   Melanoma of lower leg 2015   "left; right at my knee"   Myocardial infarction 1998   OSA (obstructive sleep apnea) 01/04/2016   no longer tolerates cpap   Peripheral vision loss, right  2006   Persistent atrial fibrillation    a. Dx 12/2012, s/p TEE/DCCV 01/26/13. b. On Xarelto (CHA2DS2VASc = 3);  c. 03/2015 TEE (EF 15-20%, no LAA thrombus) and DCCV - amio increased to 200 mg bid.   Pneumonia    Urinary hesitancy due to benign prostatic hypertrophy    Past Surgical History:  Procedure Laterality Date   ATRIAL FIBRILLATION ABLATION N/A 11/03/2019   Procedure: ATRIAL FIBRILLATION ABLATION;  Surgeon: Hillis Range, MD;  Location: [facility] INVASIVE CV LAB;  Service: Cardiovascular;  Laterality: N/A;   BACK SURGERY     upper back, cannot turn neck well   CARDIAC CATHETERIZATION  1998   CARDIOVERSION N/A 01/26/2013   Procedure: CARDIOVERSION;  Surgeon: Lewayne Bunting, MD;  Location: [facility] ENDOSCOPY;  Service: Cardiovascular;  Laterality: N/A;   CARDIOVERSION N/A 03/23/2015   Procedure: CARDIOVERSION;  Surgeon: Jake Bathe, MD;  Location: [facility] ENDOSCOPY;  Service: Cardiovascular;  Laterality: N/A;   CARDIOVERSION N/A 08/14/2017   Procedure: CARDIOVERSION;  Surgeon: Wendall Stade, MD;  Location: [facility] ENDOSCOPY;  Service: Cardiovascular;  Laterality: N/A;   CATARACT EXTRACTION Right ~ 2006   COLONOSCOPY WITH PROPOFOL N/A 02/10/2015   Procedure: COLONOSCOPY WITH PROPOFOL;  Surgeon: Chad Boop, MD;  Location: [facility] ENDOSCOPY;  Service: Endoscopy;  Laterality: N/A;   COLONOSCOPY WITH PROPOFOL N/A 08/07/2016   Procedure: COLONOSCOPY WITH PROPOFOL;  Surgeon: Chad Boop,  MD;  Location: [facility] ENDOSCOPY;  Service: Endoscopy;  Laterality: N/A;   ENTEROSCOPY N/A 08/17/2015   Procedure: ENTEROSCOPY;  Surgeon: Chad Boop, MD;  Location: [facility] ENDOSCOPY;  Service: Endoscopy;  Laterality: N/A;   EP IMPLANTABLE DEVICE N/A 03/08/2015   MDT Chad Brown CRT-D for nonischemic CM by Dr Johney Frame for primary prevention   GLAUCOMA SURGERY Right ~ 2006   "put 3 stents in to drain fluid" (03/08/2015) not successful, sent to [facility] to try to get last stent out   HOT HEMOSTASIS N/A 08/07/2016   Procedure: HOT HEMOSTASIS (ARGON  PLASMA COAGULATION/BICAP);  Surgeon: Chad Boop, MD;  Location: [facility] ENDOSCOPY;  Service: Endoscopy;  Laterality: N/A;   INCISION AND DRAINAGE ABSCESS POSTERIOR CERVICALSPINE  05/2012   JOINT REPLACEMENT     MELANOMA EXCISION Left 2015   "lower leg; right at my knee"   REFRACTIVE SURGERY Right ~ 2006 X 2   "twice; both done at [facility]" (03/08/2015   SURGERY SCROTAL / TESTICULAR Right 1990's   TEE WITHOUT CARDIOVERSION N/A 01/26/2013   Procedure: TRANSESOPHAGEAL ECHOCARDIOGRAM (TEE);  Surgeon: Lewayne Bunting, MD;  Location: [facility] ENDOSCOPY;  Service: Cardiovascular;  Laterality: N/A;  [name] anes. /    TEE WITHOUT CARDIOVERSION N/A 10/05/2014   Procedure: TRANSESOPHAGEAL ECHOCARDIOGRAM (TEE)  with cardioversion;  Surgeon: Vesta Mixer, MD;  Location: [facility] ENDOSCOPY;  Service: Cardiovascular;  Laterality: N/A;  12:52 synched cardioversion at 120 joules,...afib to SR...12 lead EKG ordered.Chad KitchenMarland KitchenCardiozem d/c'ed per MD verbal order at SR   TEE WITHOUT CARDIOVERSION N/A 03/23/2015   Procedure: TRANSESOPHAGEAL ECHOCARDIOGRAM (TEE);  Surgeon: Jake Bathe, MD;  Location: [facility] ENDOSCOPY;  Service: Cardiovascular;  Laterality: N/A;   TEE WITHOUT CARDIOVERSION N/A 11/02/2019   Procedure: TRANSESOPHAGEAL ECHOCARDIOGRAM (TEE);  Surgeon: Wendall Stade, MD;  Location: [facility] ENDOSCOPY;  Service: Cardiovascular;  Laterality: N/A;   THORACIC SPINE SURGERY  03/2000   "ground calcium deposits from upper thoracic" (01/26/2013)   THYROIDECTOMY N/A 05/11/2021   Procedure: TOTAL THYROIDECTOMY;  Surgeon: Chad Level, MD;  Location: [facility] ORS;  Service: General;  Laterality: N/A;   TOTAL HIP ARTHROPLASTY Right 06/2007     Current Outpatient Medications  Medication Sig Dispense Refill   calcitRIOL (ROCALTROL) 0.25 MCG capsule Take 0.25 mcg by mouth once a week.     calcium carbonate (TUMS - DOSED IN MG ELEMENTAL CALCIUM) 500 MG chewable tablet Chew 2 tablets by mouth 2 (two) times daily.     carvedilol (COREG) 3.125 MG tablet Take 1  tablet (3.125 mg total) by mouth 2 (two) times daily. 180 tablet 3   cholecalciferol (VITAMIN D3) 25 MCG (1000 UNIT) tablet Take 1,000 Units by mouth daily.     Cyanocobalamin (VITAMIN B 12 PO) Take 1 tablet by mouth daily.     dorzolamide-timolol (COSOPT) 22.3-6.8 MG/ML ophthalmic solution Place 1 drop into the right eye 2 (two) times daily.     empagliflozin (JARDIANCE) 10 MG TABS tablet Take 1 tablet (10 mg total) by mouth daily. 30 tablet 11   escitalopram (LEXAPRO) 20 MG tablet Take 1 tablet (20 mg total) by mouth daily. 90 tablet 0   Evolocumab (REPATHA SURECLICK) 140 MG/ML SOAJ INJECT 140MG  ( ) INTO SKIN EVERY 2 WEEKS 6 mL 3   ferrous sulfate 325 (65 FE) MG tablet Take 325 mg by mouth daily with breakfast.     finasteride (PROSCAR) 5 MG tablet Take 1 tablet (5 mg total) by mouth daily. 30 tablet 2   FOLIC ACID PO Take 1 tablet  by mouth daily.     furosemide (LASIX) 20 MG tablet Take 1 tablet (20 mg total) by mouth 2 (two) times daily. 60 tablet 0   gabapentin (NEURONTIN) 100 MG capsule Take 200 mg by mouth 2 (two) times daily.      latanoprost (XALATAN) 0.005 % ophthalmic solution Place 1 drop into the right eye at bedtime.     levothyroxine (SYNTHROID) 137 MCG tablet Take 1 tablet (137 mcg total) by mouth daily before breakfast. 90 tablet 1   nitroGLYCERIN (NITROSTAT) 0.4 MG SL tablet Place 0.4 mg under the tongue every 5 (five) minutes as needed for chest pain.     Polyethyl Glycol-Propyl Glycol 0.4-0.3 % SOLN Place 1 drop into the left eye in the morning and at bedtime.     RHOPRESSA 0.02 % SOLN Place 1 drop into the right eye at bedtime.     spironolactone (ALDACTONE) 25 MG tablet Take 25 mg by mouth daily. Pt takes 1/2 table 12.5 mg in the morning and 1/2 table 12.5 mg at night.     tamsulosin (FLOMAX) 0.4 MG CAPS Take 0.4 mg by mouth at bedtime.      XARELTO 15 MG TABS tablet TAKE (1) TABLET DAILY WITH SUPPER. 90 tablet 1   No current facility-administered medications for this  visit.    Allergies:   Pravastatin sodium and Azithromycin   Social History:  The patient  reports that he quit smoking about 22 years ago. His smoking use included cigarettes. He has a 144.00 pack-year smoking history. He has never used smokeless tobacco. He reports that he does not drink alcohol and does not use drugs.   Family History:  The patient's family history includes Colon cancer in his paternal uncle; Diabetes in his maternal aunt, maternal grandmother, maternal uncle, and mother; Heart attack in his maternal uncle; Heart disease in his mother; Kidney disease in his mother; Leukemia in his father; Lung cancer in his paternal uncle; Prostate cancer in his paternal uncle; Thyroid disease in his sister.    ROS:  Please see the history of present illness.   Otherwise, review of systems is positive for none.   All other systems are reviewed and negative.    PHYSICAL EXAM: VS:  BP 120/62   Pulse 70   Ht  (1.676 m)   Wt 216 lb (98 kg)   SpO2 98%   BMI 34.86 kg/m  , BMI Body mass index is 34.86 kg/m. GEN: Well nourished, well developed, in no acute distress  HEENT: normal  Neck: no JVD, carotid bruits, or masses Cardiac: RRR; no murmurs, rubs, or gallops,no edema  Respiratory:  clear to auscultation bilaterally, normal work of breathing GI: soft, nontender, nondistended, + BS MS: no deformity or atrophy  Skin: warm and dry, device pocket is well healed Neuro:  Strength and sensation are intact Psych: euthymic mood, full affect  EKG:  EKG is not ordered today. Personal review of the ekg ordered 10/18/22 shows AV paced  Device interrogation is reviewed today in detail.  See PaceArt for details.   Recent Labs: 10/10/2022: ALT 21 10/11/2022: Hemoglobin 10.4; Platelets 137 10/18/2022: B Natriuretic Peptide 266.3 10/26/2022: BUN 28; Creatinine, Ser 2.00; Potassium 4.3; Sodium 139 11/01/2022: TSH 3.15    Lipid Panel     Component Value Date/Time   CHOL 116 11/02/2022 0000    TRIG 202 (A) 11/02/2022 0000   HDL 28 (A) 11/02/2022 0000   CHOLHDL 4.2 10/18/2022 0949   VLDL 35 10/18/2022  0949   LDLCALC 48 11/02/2022 0000     Wt Readings from Last 3 Encounters:  01/21/23 216 lb (98 kg)  11/28/22 216 lb 6.4 oz (98.2 kg)  11/09/22 217 lb 6.4 oz (98.6 kg)      Other studies Reviewed: Additional studies/ records that were reviewed today include: TTE 03/22/21  Review of the above records today demonstrates:   1. Left ventricular ejection fraction, by estimation, is 40 to 45%. The  left ventricle has mildly decreased function. The left ventricle  demonstrates global hypokinesis. The left ventricular internal cavity size  was moderately dilated. There is mild  concentric left ventricular hypertrophy. Left ventricular diastolic  parameters are consistent with Grade III diastolic dysfunction  (restrictive).   2. Right ventricular systolic function is normal. The right ventricular  size is normal. There is mildly elevated pulmonary artery systolic  pressure.   3. Left atrial size was moderately dilated.   4. Right atrial size was moderately dilated.   5. The mitral valve is normal in structure. Trivial mitral valve  regurgitation. No evidence of mitral stenosis.   6. The aortic valve is tricuspid. Aortic valve regurgitation is trivial.    ASSESSMENT AND PLAN:  1.  Chronic systolic heart failure: Due to nonischemic cardiomyopathy.  Currently on optimal medical therapy per heart failure cardiology.  Status post Medtronic CRT-D.  Sensing, threshold, impedance within normal limits.  Battery is close to ERI.  Risk and benefits of generator change were discussed and include bleeding, infection, tamponade, heart block, stroke, MI, lead damage, kidney failure, death.  He understands the risks and is agreed to the procedure.  2.  Atrial fibrillation: Currently on Xarelto.  CHA2DS2-VASc of at least 4.  3.  Secondary hypercoagulable state: Currently on Xarelto for atrial  fibrillation  4.  Obstructive sleep apnea: CPAP compliance encouraged   Current medicines are reviewed at length with the patient today.   The patient does not have concerns regarding his medicines.  The following changes were made today:  none  Labs/ tests ordered today include:  No orders of the defined types were placed in this encounter.    Disposition:   FU with Taliya Mcclard 6 months  Signed, Nayely Dingus Chad Loa, MD  01/21/2023 4:21 PM     [facility] [address] [place] [address] [phone] (office) [phone] (fax)

## 2023-01-23 NOTE — Progress Notes (Signed)
EPIC Encounter for ICM Monitoring  Patient Name: Chad Brown is a 82 y.o. male Date: 01/23/2023 Primary Care Physican: Cleatis Polka., MD Primary Cardiologist: Nishan/McLean Electrophysiologist: Kathreen Cornfield Pacing: 99.0%   07/24/2022 Weight 210 lbs 08/31/2022 Weight: 210 lbs 10/18/2022 Office Weight: 219 lbs 12/19/2022 Weight: 210-211 lbs 01/23/2023 Weight: 211.6 lbs   Clinical Status  Since 28-Dec-2022 Time in AT/AF   <0.1 hr/day (<0.1%)   Battery Longevity: 3 months      Spoke with patient and heart failure questions reviewed.  Transmission results reviewed.  Pt asymptomatic for fluid accumulation.  Reports feeling well at this time and voices no complaints.     Optivol thoracic impedance suggesting normal fluid levels.     Prescribed:  Furosemide 40 mg 1 tablet (40 mg total) by mouth in the morning and 0.5 tablet (20 mg total)  every evening.  20 mg  Spironolactone 25 mg Take 1 tablet (25 mg total) by mouth daily. Take at bedtime   Labs: 03/15/2022 Creatine 2.01, BUN 26, Potassium 4.5, Sodium 139, GFR 33 A complete set of results can be found in Results Review.   Recommendations:  No changes and encouraged to call if experiencing any fluid symptoms.   Follow-up plan: ICM clinic phone appointment on 02/26/2023.  91 day device clinic remote transmission 02/28/2023.     EP/Cardiology Next Office Visit:     Recall 05/28/2023 with Dr Shirlee Latch.     Copy of ICM check sent to Dr. Elberta Fortis.     3 month ICM trend: 01/21/2023.    12-14 Month ICM trend:     Karie Soda, RN 01/23/2023 2:51 PM

## 2023-01-28 ENCOUNTER — Telehealth: Payer: Self-pay

## 2023-01-28 ENCOUNTER — Ambulatory Visit (INDEPENDENT_AMBULATORY_CARE_PROVIDER_SITE_OTHER): Payer: Medicare PPO

## 2023-01-28 DIAGNOSIS — I5022 Chronic systolic (congestive) heart failure: Secondary | ICD-10-CM | POA: Diagnosis not present

## 2023-01-28 LAB — CUP PACEART REMOTE DEVICE CHECK
Battery Remaining Longevity: 3 mo
Battery Voltage: 2.79 V
Brady Statistic AP VP Percent: 87.46 %
Brady Statistic AP VS Percent: 0.38 %
Brady Statistic AS VP Percent: 8.43 %
Brady Statistic AS VS Percent: 3.74 %
Brady Statistic RA Percent Paced: 85.79 %
Brady Statistic RV Percent Paced: 94.75 %
Date Time Interrogation Session: 20240429033523
HighPow Impedance: 82 Ohm
Implantable Lead Connection Status: 753985
Implantable Lead Connection Status: 753985
Implantable Lead Connection Status: 753985
Implantable Lead Implant Date: 20160607
Implantable Lead Implant Date: 20160607
Implantable Lead Implant Date: 20160607
Implantable Lead Location: 753858
Implantable Lead Location: 753859
Implantable Lead Location: 753860
Implantable Lead Model: 4598
Implantable Lead Model: 5076
Implantable Pulse Generator Implant Date: 20160607
Lead Channel Impedance Value: 1026 Ohm
Lead Channel Impedance Value: 1045 Ohm
Lead Channel Impedance Value: 1235 Ohm
Lead Channel Impedance Value: 1311 Ohm
Lead Channel Impedance Value: 1501 Ohm
Lead Channel Impedance Value: 1596 Ohm
Lead Channel Impedance Value: 380 Ohm
Lead Channel Impedance Value: 494 Ohm
Lead Channel Impedance Value: 589 Ohm
Lead Channel Impedance Value: 646 Ohm
Lead Channel Impedance Value: 665 Ohm
Lead Channel Impedance Value: 855 Ohm
Lead Channel Impedance Value: 912 Ohm
Lead Channel Pacing Threshold Amplitude: 0.5 V
Lead Channel Pacing Threshold Amplitude: 0.5 V
Lead Channel Pacing Threshold Amplitude: 1.375 V
Lead Channel Pacing Threshold Pulse Width: 0.4 ms
Lead Channel Pacing Threshold Pulse Width: 0.4 ms
Lead Channel Pacing Threshold Pulse Width: 0.8 ms
Lead Channel Sensing Intrinsic Amplitude: 1.375 mV
Lead Channel Sensing Intrinsic Amplitude: 1.375 mV
Lead Channel Sensing Intrinsic Amplitude: 24.125 mV
Lead Channel Sensing Intrinsic Amplitude: 24.125 mV
Lead Channel Setting Pacing Amplitude: 1.5 V
Lead Channel Setting Pacing Amplitude: 2 V
Lead Channel Setting Pacing Amplitude: 2 V
Lead Channel Setting Pacing Pulse Width: 0.4 ms
Lead Channel Setting Pacing Pulse Width: 0.8 ms
Lead Channel Setting Sensing Sensitivity: 0.3 mV
Zone Setting Status: 755011
Zone Setting Status: 755011

## 2023-01-28 NOTE — Telephone Encounter (Signed)
Returned call to patient as requested by voice mail message.  Pt left message stating he had episode of 135 HF and BP was high Sunday morning, 4/28.   He sent remote transmission for review and would like call back.    Attempted to call patient back but unable to reach.  He had an appointment today and will return after 12:00 noon today for call back.    Routed to device clinic triage for follow up and review for EGM and AT/AF episodes.  Pt takes Xarelto.    Carelink transmission received:

## 2023-01-28 NOTE — Telephone Encounter (Signed)
Spoke with patient.   Reports feeling fast heart rates yesterday. Denies any symptoms today.  Transmission shows patient had multiple episodes of Afib/flutter, with poor ventricular control during events.  Total of 10 hours on 4/28.  Hx of Afib;ablation in 2021.  On Xarelto. He is currently taking carvedilol 3.125mg  bid.  No missed doses or changes in diet, health or medications.   Updated transmission today shows no new episodes since 4/28 and patient is currently AP/Bi VP at approx 62bpm.   Forwarding to Dr. Elberta Fortis for review.  Patient waiting for any f/u instruction.

## 2023-01-29 NOTE — Progress Notes (Signed)
Remote ICD transmission.   

## 2023-01-29 NOTE — Telephone Encounter (Signed)
Patient called to advise AF clinic referral per Dr. Elberta Fortis. Patient agreeable. Advised someone will call with apt.

## 2023-02-06 ENCOUNTER — Ambulatory Visit (HOSPITAL_COMMUNITY)
Admission: RE | Admit: 2023-02-06 | Discharge: 2023-02-06 | Disposition: A | Discharge status: Home / Self Care | Payer: Medicare PPO | Source: Ambulatory Visit | Attending: Internal Medicine | Admitting: Internal Medicine

## 2023-02-06 VITALS — BP 134/80 | HR 69 | Ht 66.0 in | Wt 214.8 lb

## 2023-02-06 DIAGNOSIS — I5022 Chronic systolic (congestive) heart failure: Secondary | ICD-10-CM | POA: Insufficient documentation

## 2023-02-06 DIAGNOSIS — I110 Hypertensive heart disease with heart failure: Secondary | ICD-10-CM | POA: Insufficient documentation

## 2023-02-06 DIAGNOSIS — D649 Anemia, unspecified: Secondary | ICD-10-CM | POA: Diagnosis not present

## 2023-02-06 DIAGNOSIS — E669 Obesity, unspecified: Secondary | ICD-10-CM | POA: Diagnosis not present

## 2023-02-06 DIAGNOSIS — I4819 Other persistent atrial fibrillation: Secondary | ICD-10-CM | POA: Insufficient documentation

## 2023-02-06 DIAGNOSIS — Z6834 Body mass index (BMI) 34.0-34.9, adult: Secondary | ICD-10-CM | POA: Insufficient documentation

## 2023-02-06 DIAGNOSIS — G4733 Obstructive sleep apnea (adult) (pediatric): Secondary | ICD-10-CM | POA: Insufficient documentation

## 2023-02-06 DIAGNOSIS — D6869 Other thrombophilia: Secondary | ICD-10-CM

## 2023-02-06 DIAGNOSIS — I4891 Unspecified atrial fibrillation: Secondary | ICD-10-CM | POA: Diagnosis not present

## 2023-02-06 DIAGNOSIS — Z7901 Long term (current) use of anticoagulants: Secondary | ICD-10-CM | POA: Diagnosis not present

## 2023-02-06 MED ORDER — AMIODARONE HCL 200 MG PO TABS: ORAL_TABLET | ORAL | 0 refills | Status: DC

## 2023-02-06 NOTE — Progress Notes (Signed)
Primary Care Physician: Cleatis Polka., MD Primary Cardiologist: [facility]: Dr Shirlee Latch Primary Electrophysiologist: Dr Elberta Fortis Referring Physician: Dr Christie Nottingham is a 82 y.o. male with a history of persistent atrial fibrillation, systolic CHF, CKD, OSA, HLD, who presents for follow up in the [facility].  The patient was initially diagnosed with atrial fibrillation 12/2012. Patient is on Xarelto for a CHADS2VASC score of 4. Echo showed EF 30-35%.  He had DCCV back to NSR.  He was back in atrial fibrillation in 1/16, and EF was low again on TEE at that time and he had DCCV.  In 5/16, cardiac MRI showed persistently low EF, so given LBBB, he had [employer] CRT-D device placed.  He was back in atrial fibrillation in 6/16 and had TEE-guided DCCV. Patient noted to be back in afib 10/2019 despite amiodarone therapy. He underwent afib ablation with Dr Johney Frame on 11/03/19. Patient reports that he has done well since the procedure. His weight has been stable and no lower extremity edema. Patient does report that he fell about 3 weeks ago and hit the side of his face on the trash can. He did not seek evaluation for this. No presyncope, loss of consciousness, or residual symptoms. He denies chest pain, swallowing, or groin issues.  On follow up 5/8, he is currently in SR today. Recent review of device on 4/29 showed 30 episodes of AF with 6% burden. Patient notes that when he was in episodes would feel anxious, weak, and dizzy. No shortness of breath or chest pain with episodes.   Today, he denies symptoms of palpitations, orthopnea, PND, lower extremity edema, presyncope, syncope, snoring, daytime somnolence, bleeding, or neurologic sequela. The patient is tolerating medications without difficulties and is otherwise without complaint today.    Atrial Fibrillation Risk Factors:  he does have symptoms or diagnosis of sleep apnea. he is compliant with CPAP  therapy. he does not have a history of rheumatic fever.  he has a BMI of Body mass index is 34.67 kg/m.[name] Filed Weights   02/06/23 0922  Weight: 97.4 kg     Family History  Problem Relation Age of Onset   Kidney disease Mother    Heart disease Mother        MI, open heart   Diabetes Mother        dialysis   Leukemia Father    Colon cancer Paternal Uncle    Lung cancer Paternal Uncle        x 2   Prostate cancer Paternal Uncle    Diabetes Maternal Grandmother    Heart attack Maternal Uncle    Diabetes Maternal Aunt        x 3   Diabetes Maternal Uncle    Thyroid disease Sister      Atrial Fibrillation Management history:  Previous antiarrhythmic drugs: amiodarone Previous cardioversions: several, most recently 2018 Previous ablations: 11/03/19 CHADS2VASC score: 4 Anticoagulation history: Xarelto   Past Medical History:  Diagnosis Date   Adenomatous colon polyp    tubular   AICD (automatic cardioverter/defibrillator) present 03/08/2015   MDT CRTD dual pacemaker and defib   Anemia    iron deficient   Arthritis    "about all my joints; hands, knees, back" (03/08/2015)   Atherosclerosis    Cataract    left eye small   Cholelithiasis    gallstones   Chronic systolic CHF (congestive heart failure) (HCC)    a. New dx  12/2012 ? NICM, may be r/t afib. b. Nuc 03/2013 - normal;  c. 03/2015 TEE EF 15-20%.   Colon polyp, hyperplastic 01/2015   removed precancerous lesions   Depression    Diverticulosis    Dysrhythmia    afib   GERD (gastroesophageal reflux disease)    Glaucoma    right eye   Hyperlipidemia    Hypertension    Melanoma of eye (HCC) 2000's   "right; it's never been biopsied"   Melanoma of lower leg (HCC) 2015   "left; right at my knee"   Myocardial infarction (HCC) 1998   OSA (obstructive sleep apnea) 01/04/2016   no longer tolerates cpap   Peripheral vision loss, right 2006   Persistent atrial fibrillation (HCC)    a. Dx 12/2012, s/p TEE/DCCV  01/26/13. b. On Xarelto (CHA2DS2VASc = 3);  c. 03/2015 TEE (EF 15-20%, no LAA thrombus) and DCCV - amio increased to 200 mg bid.   Pneumonia    Urinary hesitancy due to benign prostatic hypertrophy    Past Surgical History:  Procedure Laterality Date   ATRIAL FIBRILLATION ABLATION N/A 11/03/2019   Procedure: ATRIAL FIBRILLATION ABLATION;  Surgeon: Hillis Range, MD;  Location: [facility] INVASIVE CV LAB;  Service: Cardiovascular;  Laterality: N/A;   BACK SURGERY     upper back, cannot turn neck well   CARDIAC CATHETERIZATION  1998   CARDIOVERSION N/A 01/26/2013   Procedure: CARDIOVERSION;  Surgeon: Lewayne Bunting, MD;  Location: [facility] ENDOSCOPY;  Service: Cardiovascular;  Laterality: N/A;   CARDIOVERSION N/A 03/23/2015   Procedure: CARDIOVERSION;  Surgeon: Jake Bathe, MD;  Location: [facility] ENDOSCOPY;  Service: Cardiovascular;  Laterality: N/A;   CARDIOVERSION N/A 08/14/2017   Procedure: CARDIOVERSION;  Surgeon: Wendall Stade, MD;  Location: [facility] ENDOSCOPY;  Service: Cardiovascular;  Laterality: N/A;   CATARACT EXTRACTION Right ~ 2006   COLONOSCOPY WITH PROPOFOL N/A 02/10/2015   Procedure: COLONOSCOPY WITH PROPOFOL;  Surgeon: Iva Boop, MD;  Location: [facility] ENDOSCOPY;  Service: Endoscopy;  Laterality: N/A;   COLONOSCOPY WITH PROPOFOL N/A 08/07/2016   Procedure: COLONOSCOPY WITH PROPOFOL;  Surgeon: Iva Boop, MD;  Location: [facility] ENDOSCOPY;  Service: Endoscopy;  Laterality: N/A;   ENTEROSCOPY N/A 08/17/2015   Procedure: ENTEROSCOPY;  Surgeon: Iva Boop, MD;  Location: [facility] ENDOSCOPY;  Service: Endoscopy;  Laterality: N/A;   EP IMPLANTABLE DEVICE N/A 03/08/2015   MDT Ovidio Kin CRT-D for nonischemic CM by Dr Johney Frame for primary prevention   GLAUCOMA SURGERY Right ~ 2006   "put 3 stents in to drain fluid" (03/08/2015) not successful, sent to [facility] to try to get last stent out   HOT HEMOSTASIS N/A 08/07/2016   Procedure: HOT HEMOSTASIS (ARGON PLASMA COAGULATION/BICAP);  Surgeon: Iva Boop, MD;  Location:  [facility] ENDOSCOPY;  Service: Endoscopy;  Laterality: N/A;   INCISION AND DRAINAGE ABSCESS POSTERIOR CERVICALSPINE  05/2012   JOINT REPLACEMENT     MELANOMA EXCISION Left 2015   "lower leg; right at my knee"   REFRACTIVE SURGERY Right ~ 2006 X 2   "twice; both done at [facility]" (03/08/2015   SURGERY SCROTAL / TESTICULAR Right 1990's   TEE WITHOUT CARDIOVERSION N/A 01/26/2013   Procedure: TRANSESOPHAGEAL ECHOCARDIOGRAM (TEE);  Surgeon: Lewayne Bunting, MD;  Location: [facility] ENDOSCOPY;  Service: Cardiovascular;  Laterality: N/A;  [name] anes. /    TEE WITHOUT CARDIOVERSION N/A 10/05/2014   Procedure: TRANSESOPHAGEAL ECHOCARDIOGRAM (TEE)  with cardioversion;  Surgeon: Vesta Mixer, MD;  Location: [facility] ENDOSCOPY;  Service: Cardiovascular;  Laterality: N/A;  12:52 synched cardioversion at 120 joules,...afib to SR...12 lead EKG ordered.Marland KitchenMarland KitchenCardiozem d/c'ed per MD verbal order at SR   TEE WITHOUT CARDIOVERSION N/A 03/23/2015   Procedure: TRANSESOPHAGEAL ECHOCARDIOGRAM (TEE);  Surgeon: Jake Bathe, MD;  Location: [facility] ENDOSCOPY;  Service: Cardiovascular;  Laterality: N/A;   TEE WITHOUT CARDIOVERSION N/A 11/02/2019   Procedure: TRANSESOPHAGEAL ECHOCARDIOGRAM (TEE);  Surgeon: Wendall Stade, MD;  Location: [facility] ENDOSCOPY;  Service: Cardiovascular;  Laterality: N/A;   THORACIC SPINE SURGERY  03/2000   "ground calcium deposits from upper thoracic" (01/26/2013)   THYROIDECTOMY N/A 05/11/2021   Procedure: TOTAL THYROIDECTOMY;  Surgeon: Darnell Level, MD;  Location: [facility] ORS;  Service: General;  Laterality: N/A;   TOTAL HIP ARTHROPLASTY Right 06/2007    Current Outpatient Medications  Medication Sig Dispense Refill   amiodarone (PACERONE) 200 MG tablet Take 1 tablet by mouth twice a day for 14 days then reduce to once a day 45 tablet 0   calcitRIOL (ROCALTROL) 0.25 MCG capsule Take 0.25 mcg by mouth once a week.     calcium carbonate (TUMS - DOSED IN MG ELEMENTAL CALCIUM) 500 MG chewable tablet Chew 2 tablets by mouth 2 (two) times  daily.     carvedilol (COREG) 3.125 MG tablet Take 1 tablet (3.125 mg total) by mouth 2 (two) times daily. 180 tablet 3   cholecalciferol (VITAMIN D3) 25 MCG (1000 UNIT) tablet Take 1,000 Units by mouth daily.     Cyanocobalamin (VITAMIN B 12 PO) Take 1 tablet by mouth daily.     dorzolamide-timolol (COSOPT) 22.3-6.8 MG/ML ophthalmic solution Place 1 drop into the right eye 2 (two) times daily.     empagliflozin (JARDIANCE) 10 MG TABS tablet Take 1 tablet (10 mg total) by mouth daily. 30 tablet 11   escitalopram (LEXAPRO) 20 MG tablet Take 1 tablet (20 mg total) by mouth daily. 90 tablet 0   Evolocumab (REPATHA SURECLICK) 140 MG/ML SOAJ INJECT 140MG  ( ) INTO SKIN EVERY 2 WEEKS 6 mL 3   ferrous sulfate 325 (65 FE) MG tablet Take 325 mg by mouth daily with breakfast.     finasteride (PROSCAR) 5 MG tablet Take 1 tablet (5 mg total) by mouth daily. 30 tablet 2   FOLIC ACID PO Take 1 tablet by mouth daily.     furosemide (LASIX) 20 MG tablet Take 1 tablet (20 mg total) by mouth 2 (two) times daily. 60 tablet 0   gabapentin (NEURONTIN) 100 MG capsule Take 200 mg by mouth 2 (two) times daily.      latanoprost (XALATAN) 0.005 % ophthalmic solution Place 1 drop into the right eye at bedtime.     levothyroxine (SYNTHROID) 137 MCG tablet Take 1 tablet (137 mcg total) by mouth daily before breakfast. 90 tablet 1   nitroGLYCERIN (NITROSTAT) 0.4 MG SL tablet Place 0.4 mg under the tongue every 5 (five) minutes as needed for chest pain.     Polyethyl Glycol-Propyl Glycol 0.4-0.3 % SOLN Place 1 drop into the left eye in the morning and at bedtime.     RHOPRESSA 0.02 % SOLN Place 1 drop into the right eye at bedtime.     spironolactone (ALDACTONE) 25 MG tablet Take 25 mg by mouth daily. Pt takes 1/2 tablet 12.5 mg in the morning and 1/2 tablet 12.5 mg at night.     tamsulosin (FLOMAX) 0.4 MG CAPS Take 0.4 mg by mouth at bedtime.      XARELTO 15 MG TABS tablet TAKE (1) TABLET DAILY WITH  SUPPER. 90 tablet 1    No current facility-administered medications for this encounter.    Allergies  Allergen Reactions   Pravastatin Sodium Other (See Comments)    Joint and muscle pain   Azithromycin Diarrhea    Social History   Socioeconomic History   Marital status: Married    Spouse name: Not on file   Number of children: 3   Years of education: Not on file   Highest education level: Not on file  Occupational History   Occupation: retired  Tobacco Use   Smoking status: Former    Packs/day: 3.00    Years: 48.00    Additional pack years: 0.00    Total pack years: 144.00    Types: Cigarettes    Quit date: 09/28/2000    Years since quitting: 22.3   Smokeless tobacco: Never  Vaping Use   Vaping Use: Never used  Substance and Sexual Activity   Alcohol use: No   Drug use: No   Sexual activity: Not Currently  Other Topics Concern   Not on file  Social History Narrative   Pt lives in [place] with spouse.  3 children are grown and healthy.   Retired.  Ran a country store for 30 years, previously worked in the [employer] for 16 years   Social Determinants of Corporate investment banker Strain: Not on [employer] Insecurity: Not on file  Transportation Needs: No Transportation Needs (11/03/2020)   PRAPARE - Administrator, Civil Service (Medical): No    Lack of Transportation (Non-Medical): No  Physical Activity: Inactive (11/03/2020)   Exercise Vital Sign    Days of Exercise per Week: 0 days    Minutes of Exercise per Session: 0 min  Stress: Not on file  Social Connections: Not on file  Intimate Partner Violence: Not At Risk (10/11/2022)   Humiliation, Afraid, Rape, and Kick questionnaire    Fear of Current or Ex-Partner: No    Emotionally Abused: No    Physically Abused: No    Sexually Abused: No     ROS- All systems are reviewed and negative except as per the HPI above.  Physical Exam: Vitals:   02/06/23 0922  BP: 134/80  Pulse: 69  Weight: 97.4 kg   Height: 5\' 6"  (1.676 m)    GEN- The patient is well appearing, alert and oriented x 3 today.   Head- normocephalic, atraumatic Eyes-  Sclera clear, conjunctiva pink Ears- hearing intact Oropharynx- clear Neck- supple, no JVP Lymph- no cervical lymphadenopathy Lungs- Clear to ausculation bilaterally, normal work of breathing Heart- Regular rate and rhythm, no murmurs, rubs or gallops, PMI not laterally displaced GI- soft, NT, ND, + BS Extremities- no clubbing, cyanosis, or edema MS- no significant deformity or atrophy Skin- no rash or lesion Psych- euthymic mood, full affect Neuro- strength and sensation are intact   Wt Readings from Last 3 Encounters:  02/06/23 97.4 kg  01/21/23 98 kg  11/28/22 98.2 kg    EKG today demonstrates Vent. rate 69 BPM PR interval 126 ms QRS duration 168 ms QT/QTcB 478/512 ms P-R-T axes 70 232 26 AV dual-paced rhythm Biventricular pacemaker detected Abnormal ECG When compared with ECG of 18-Oct-2022 09:15, PREVIOUS ECG IS PRESENT  Echo 03/22/21:  1. Left ventricular ejection fraction, by estimation, is 40 to 45%. The  left ventricle has mildly decreased function. The left ventricle  demonstrates global hypokinesis. The left ventricular internal cavity size  was moderately dilated. There  is mild  concentric left ventricular hypertrophy. Left ventricular diastolic  parameters are consistent with Grade III diastolic dysfunction  (restrictive).   2. Right ventricular systolic function is normal. The right ventricular  size is normal. There is mildly elevated pulmonary artery systolic  pressure.   3. Left atrial size was moderately dilated.   4. Right atrial size was moderately dilated.   5. The mitral valve is normal in structure. Trivial mitral valve  regurgitation. No evidence of mitral stenosis.   6. The aortic valve is tricuspid. Aortic valve regurgitation is trivial.    Epic records are reviewed at length today  CHA2DS2-VASc Score  =  4 The patient's score is based upon:        ASSESSMENT AND PLAN: 1. Persistent Atrial Fibrillation (ICD10:  I48.19) The patient's CHA2DS2-VASc score is 4 , indicating a  4.0% annual risk of stroke.   S/p afib ablation 11/03/19.  Patient is in SR today.  Review of device and per patient he felt poorly when in Afib with RVR. After discussion with patient regarding potential options for treatment, we will proceed with the following plan:  Begin amiodarone 200 mg BID x 14 days, then once daily. Repeat ECG in 2-3 weeks. I will schedule f/u with Dr. Elberta Fortis to discuss potential repeat ablation. I explained to patient that maybe his abnormal renal function could be prohibitive but can discuss with EP.  Continue Coreg 3.125 mg BID  2. Secondary Hypercoagulable State (ICD10:  D68.69) The patient is at significant risk for stroke/thromboembolism based upon his CHA2DS2-VASc Score of 4 .  Continue Rivaroxaban (Xarelto).  No missed doses 3. Obesity Body mass index is 34.67 kg/m. Lifestyle modification was discussed at length including regular exercise and weight reduction. Encouraged daily walking as tolerated.  4. Obstructive sleep apnea The importance of adequate treatment of sleep apnea was discussed today in order to improve our ability to maintain sinus rhythm long term. Patient reports compliance with CPAP therapy.  5. Chronic systolic CHF No signs or symptoms of fluid overload today. Weight stable.    Repeat ECG 2-3 weeks.   Lake Bells, PA-C Afib Clinic [facility] [address] [place], [place] [address] [phone] 02/06/2023 10:18 AM

## 2023-02-06 NOTE — Patient Instructions (Signed)
Start Amiodarone 200mg  -- take 1 tablet by mouth twice a day for 14 days then reduce to once a day take with food

## 2023-02-19 DIAGNOSIS — N189 Chronic kidney disease, unspecified: Secondary | ICD-10-CM | POA: Diagnosis not present

## 2023-02-19 DIAGNOSIS — Z79899 Other long term (current) drug therapy: Secondary | ICD-10-CM | POA: Diagnosis not present

## 2023-02-19 DIAGNOSIS — R809 Proteinuria, unspecified: Secondary | ICD-10-CM | POA: Diagnosis not present

## 2023-02-19 DIAGNOSIS — D638 Anemia in other chronic diseases classified elsewhere: Secondary | ICD-10-CM | POA: Diagnosis not present

## 2023-02-19 DIAGNOSIS — E211 Secondary hyperparathyroidism, not elsewhere classified: Secondary | ICD-10-CM | POA: Diagnosis not present

## 2023-02-20 ENCOUNTER — Ambulatory Visit (HOSPITAL_COMMUNITY)
Admission: RE | Admit: 2023-02-20 | Discharge: 2023-02-20 | Disposition: A | Discharge status: Home / Self Care | Payer: Medicare PPO | Source: Ambulatory Visit | Attending: Internal Medicine | Admitting: Internal Medicine

## 2023-02-20 ENCOUNTER — Other Ambulatory Visit (HOSPITAL_COMMUNITY): Payer: Self-pay | Admitting: *Deleted

## 2023-02-20 VITALS — HR 78

## 2023-02-20 DIAGNOSIS — R9431 Abnormal electrocardiogram [ECG] [EKG]: Secondary | ICD-10-CM | POA: Insufficient documentation

## 2023-02-20 DIAGNOSIS — Z79899 Other long term (current) drug therapy: Secondary | ICD-10-CM | POA: Insufficient documentation

## 2023-02-20 DIAGNOSIS — I4891 Unspecified atrial fibrillation: Secondary | ICD-10-CM | POA: Diagnosis not present

## 2023-02-20 DIAGNOSIS — Z950 Presence of cardiac pacemaker: Secondary | ICD-10-CM | POA: Diagnosis not present

## 2023-02-20 MED ORDER — AMIODARONE HCL 200 MG PO TABS: 200.0000 mg | ORAL_TABLET | Freq: Every day | ORAL | 1 refills | Status: DC

## 2023-02-20 MED ORDER — AMIODARONE HCL 200 MG PO TABS: 200.0000 mg | ORAL_TABLET | Freq: Every day | ORAL | 3 refills | Status: DC

## 2023-02-20 NOTE — Progress Notes (Signed)
Patient doing well with no concerns. Today patient will transition to take amiodarone 200 mg once daily.  He has a BP diary with excellent readings 120-130s/70-80s with HR 60-70s.   ECG today shows  Vent. rate 78 BPM PR interval 126 ms QRS duration 186 ms QT/QTcB 478/544 ms P-R-T axes 73 243 51 AV dual-paced rhythm Biventricular pacemaker detected Abnormal ECG When compared with ECG of 06-Feb-2023 09:34, PREVIOUS ECG IS PRESENT   Pt will follow up with myself in 3 months.

## 2023-02-22 ENCOUNTER — Other Ambulatory Visit (HOSPITAL_COMMUNITY): Payer: Self-pay | Admitting: Cardiology

## 2023-02-26 ENCOUNTER — Ambulatory Visit: Payer: Medicare PPO | Attending: Cardiology

## 2023-02-26 DIAGNOSIS — Z95810 Presence of automatic (implantable) cardiac defibrillator: Secondary | ICD-10-CM

## 2023-02-26 DIAGNOSIS — I5022 Chronic systolic (congestive) heart failure: Secondary | ICD-10-CM

## 2023-02-27 DIAGNOSIS — I5042 Chronic combined systolic (congestive) and diastolic (congestive) heart failure: Secondary | ICD-10-CM | POA: Diagnosis not present

## 2023-02-27 DIAGNOSIS — N2581 Secondary hyperparathyroidism of renal origin: Secondary | ICD-10-CM | POA: Diagnosis not present

## 2023-02-27 DIAGNOSIS — D696 Thrombocytopenia, unspecified: Secondary | ICD-10-CM | POA: Diagnosis not present

## 2023-02-27 DIAGNOSIS — N184 Chronic kidney disease, stage 4 (severe): Secondary | ICD-10-CM | POA: Diagnosis not present

## 2023-02-28 ENCOUNTER — Ambulatory Visit (INDEPENDENT_AMBULATORY_CARE_PROVIDER_SITE_OTHER): Payer: Medicare PPO

## 2023-02-28 ENCOUNTER — Other Ambulatory Visit: Payer: Self-pay | Admitting: Cardiology

## 2023-02-28 DIAGNOSIS — I426 Alcoholic cardiomyopathy: Secondary | ICD-10-CM

## 2023-02-28 LAB — CUP PACEART REMOTE DEVICE CHECK
Battery Remaining Longevity: 2 mo
Battery Voltage: 2.78 V
Brady Statistic AP VP Percent: 99.62 %
Brady Statistic AP VS Percent: 0.05 %
Brady Statistic AS VP Percent: 0.31 %
Brady Statistic AS VS Percent: 0.02 %
Brady Statistic RA Percent Paced: 99.59 %
Brady Statistic RV Percent Paced: 99.86 %
Date Time Interrogation Session: 20240530001809
HighPow Impedance: 90 Ohm
Implantable Lead Connection Status: 753985
Implantable Lead Connection Status: 753985
Implantable Lead Connection Status: 753985
Implantable Lead Implant Date: 20160607
Implantable Lead Implant Date: 20160607
Implantable Lead Implant Date: 20160607
Implantable Lead Location: 753858
Implantable Lead Location: 753859
Implantable Lead Location: 753860
Implantable Lead Model: 4598
Implantable Lead Model: 5076
Implantable Pulse Generator Implant Date: 20160607
Lead Channel Impedance Value: 1083 Ohm
Lead Channel Impedance Value: 1102 Ohm
Lead Channel Impedance Value: 1349 Ohm
Lead Channel Impedance Value: 1368 Ohm
Lead Channel Impedance Value: 1539 Ohm
Lead Channel Impedance Value: 1805 Ohm
Lead Channel Impedance Value: 380 Ohm
Lead Channel Impedance Value: 494 Ohm
Lead Channel Impedance Value: 532 Ohm
Lead Channel Impedance Value: 627 Ohm
Lead Channel Impedance Value: 703 Ohm
Lead Channel Impedance Value: 950 Ohm
Lead Channel Impedance Value: 969 Ohm
Lead Channel Pacing Threshold Amplitude: 0.5 V
Lead Channel Pacing Threshold Amplitude: 0.5 V
Lead Channel Pacing Threshold Amplitude: 1.375 V
Lead Channel Pacing Threshold Pulse Width: 0.4 ms
Lead Channel Pacing Threshold Pulse Width: 0.4 ms
Lead Channel Pacing Threshold Pulse Width: 0.8 ms
Lead Channel Sensing Intrinsic Amplitude: 15.125 mV
Lead Channel Sensing Intrinsic Amplitude: 15.125 mV
Lead Channel Sensing Intrinsic Amplitude: 2.375 mV
Lead Channel Sensing Intrinsic Amplitude: 2.375 mV
Lead Channel Setting Pacing Amplitude: 1.5 V
Lead Channel Setting Pacing Amplitude: 2 V
Lead Channel Setting Pacing Amplitude: 2 V
Lead Channel Setting Pacing Pulse Width: 0.4 ms
Lead Channel Setting Pacing Pulse Width: 0.8 ms
Lead Channel Setting Sensing Sensitivity: 0.3 mV
Zone Setting Status: 755011
Zone Setting Status: 755011

## 2023-02-28 NOTE — Progress Notes (Signed)
Remote ICD transmission.   

## 2023-02-28 NOTE — Progress Notes (Signed)
EPIC Encounter for ICM Monitoring  Patient Name: Chad Brown is a 82 y.o. male Date: 02/28/2023 Primary Care Physican: Cleatis Polka., MD Primary Cardiologist: Nishan/McLean Electrophysiologist: Kathreen Cornfield Pacing: 99.0%   07/24/2022 Weight 210 lbs 08/31/2022 Weight: 210 lbs 10/18/2022 Office Weight: 219 lbs 12/19/2022 Weight: 210-211 lbs 01/23/2023 Weight: 211.6 lbs   Clinical Status Since 28-Jan-2023 Time in AT/AF <0.1 hr/day (<0.1%) Longest AT/AF  77 seconds   Battery Longevity: 2 months      Spoke with wife and heart failure questions reviewed.  Transmission results reviewed.  Pt asymptomatic for fluid accumulation.  Reports feeling well at this time and voices no complaints.     Optivol thoracic impedance suggesting normal fluid levels.     Prescribed:  Furosemide 40 mg 1 tablet (40 mg total) by mouth in the morning and 0.5 tablet (20 mg total)  every evening  20 mg  Spironolactone 25 mg Take 1 tablet (25 mg total) by mouth daily. Take at bedtime   Labs: 03/15/2022 Creatine 2.01, BUN 26, Potassium 4.5, Sodium 139, GFR 33 A complete set of results can be found in Results Review.   Recommendations:  No changes and encouraged to call if experiencing any fluid symptoms.   Follow-up plan: ICM clinic phone appointment on 04/01/2023.  91 day device clinic remote transmission 05/30/2023.     EP/Cardiology Next Office Visit:   03/07/2023 with Dr Elberta Fortis.   Recall 05/28/2023 with Dr Shirlee Latch.     Copy of ICM check sent to Dr. Elberta Fortis.     3 month ICM trend: 02/26/2023.    12-14 Month ICM trend:     Karie Soda, RN 02/28/2023 2:33 PM

## 2023-03-07 ENCOUNTER — Ambulatory Visit: Payer: Medicare PPO | Attending: Cardiology | Admitting: Cardiology

## 2023-03-07 ENCOUNTER — Encounter: Payer: Self-pay | Admitting: Cardiology

## 2023-03-07 VITALS — BP 110/68 | HR 65 | Ht 66.0 in | Wt 217.0 lb

## 2023-03-07 DIAGNOSIS — I5022 Chronic systolic (congestive) heart failure: Secondary | ICD-10-CM

## 2023-03-07 DIAGNOSIS — G4733 Obstructive sleep apnea (adult) (pediatric): Secondary | ICD-10-CM

## 2023-03-07 DIAGNOSIS — Z79899 Other long term (current) drug therapy: Secondary | ICD-10-CM | POA: Diagnosis not present

## 2023-03-07 DIAGNOSIS — I480 Paroxysmal atrial fibrillation: Secondary | ICD-10-CM | POA: Diagnosis not present

## 2023-03-07 DIAGNOSIS — D6869 Other thrombophilia: Secondary | ICD-10-CM

## 2023-03-07 NOTE — Progress Notes (Signed)
Electrophysiology Office Note   Date:  03/07/2023   ID:  Chad Brown, DOB 1941-09-23, MRN [id number]  PCP:  Cleatis Polka., MD  Cardiologist:  Shirlee Latch Primary Electrophysiologist:  Jearlean Demauro Jorja Loa, MD    Chief Complaint: ICD, AF   History of Present Illness: Chad Brown is a 82 y.o. male who is being seen today for the evaluation of ICD, AF at the request of Cleatis Polka., MD. Presenting today for electrophysiology evaluation.  He has a history seen for atrial fibrillation and chronic systolic heart failure: He had an atrial fibrillation ablation February 2021.  He is post [employer] CRT-D.  He is unfortunately had more frequent episodes of atrial fibrillation which make him feel quite poorly.  He has been started on amiodarone.  Today, denies symptoms of palpitations, chest pain, shortness of breath, orthopnea, PND, lower extremity edema, claudication, dizziness, presyncope, syncope, bleeding, or neurologic sequela. The patient is tolerating medications without difficulties.  Since being seen he has done well.  He has had no chest pain or shortness of breath.  He has had recurrence of atrial fibrillation and has been started on amiodarone.  Since starting the medication, he has no acute complaints.   Past Medical History:  Diagnosis Date   Adenomatous colon polyp    tubular   AICD (automatic cardioverter/defibrillator) present 03/08/2015   MDT CRTD dual pacemaker and defib   Anemia    iron deficient   Arthritis    "about all my joints; hands, knees, back" (03/08/2015)   Atherosclerosis    Cataract    left eye small   Cholelithiasis    gallstones   Chronic systolic CHF (congestive heart failure) (HCC)    a. New dx 12/2012 ? NICM, may be r/t afib. b. Nuc 03/2013 - normal;  c. 03/2015 TEE EF 15-20%.   Colon polyp, hyperplastic 01/2015   removed precancerous lesions   Depression    Diverticulosis    Dysrhythmia    afib   GERD (gastroesophageal reflux  disease)    Glaucoma    right eye   Hyperlipidemia    Hypertension    Melanoma of eye (HCC) 2000's   "right; it's never been biopsied"   Melanoma of lower leg (HCC) 2015   "left; right at my knee"   Myocardial infarction (HCC) 1998   OSA (obstructive sleep apnea) 01/04/2016   no longer tolerates cpap   Peripheral vision loss, right 2006   Persistent atrial fibrillation (HCC)    a. Dx 12/2012, s/p TEE/DCCV 01/26/13. b. On Xarelto (CHA2DS2VASc = 3);  c. 03/2015 TEE (EF 15-20%, no LAA thrombus) and DCCV - amio increased to 200 mg bid.   Pneumonia    Urinary hesitancy due to benign prostatic hypertrophy    Past Surgical History:  Procedure Laterality Date   ATRIAL FIBRILLATION ABLATION N/A 11/03/2019   Procedure: ATRIAL FIBRILLATION ABLATION;  Surgeon: Hillis Range, MD;  Location: [facility] INVASIVE CV LAB;  Service: Cardiovascular;  Laterality: N/A;   BACK SURGERY     upper back, cannot turn neck well   CARDIAC CATHETERIZATION  1998   CARDIOVERSION N/A 01/26/2013   Procedure: CARDIOVERSION;  Surgeon: Lewayne Bunting, MD;  Location: [facility] ENDOSCOPY;  Service: Cardiovascular;  Laterality: N/A;   CARDIOVERSION N/A 03/23/2015   Procedure: CARDIOVERSION;  Surgeon: Jake Bathe, MD;  Location: [facility] ENDOSCOPY;  Service: Cardiovascular;  Laterality: N/A;   CARDIOVERSION N/A 08/14/2017   Procedure: CARDIOVERSION;  Surgeon: Wendall Stade, MD;  Location: [facility] ENDOSCOPY;  Service: Cardiovascular;  Laterality: N/A;   CATARACT EXTRACTION Right ~ 2006   COLONOSCOPY WITH PROPOFOL N/A 02/10/2015   Procedure: COLONOSCOPY WITH PROPOFOL;  Surgeon: Iva Boop, MD;  Location: [facility] ENDOSCOPY;  Service: Endoscopy;  Laterality: N/A;   COLONOSCOPY WITH PROPOFOL N/A 08/07/2016   Procedure: COLONOSCOPY WITH PROPOFOL;  Surgeon: Iva Boop, MD;  Location: [facility] ENDOSCOPY;  Service: Endoscopy;  Laterality: N/A;   ENTEROSCOPY N/A 08/17/2015   Procedure: ENTEROSCOPY;  Surgeon: Iva Boop, MD;  Location: [facility] ENDOSCOPY;   Service: Endoscopy;  Laterality: N/A;   EP IMPLANTABLE DEVICE N/A 03/08/2015   MDT Ovidio Kin CRT-D for nonischemic CM by Dr Johney Frame for primary prevention   GLAUCOMA SURGERY Right ~ 2006   "put 3 stents in to drain fluid" (03/08/2015) not successful, sent to [facility] to try to get last stent out   HOT HEMOSTASIS N/A 08/07/2016   Procedure: HOT HEMOSTASIS (ARGON PLASMA COAGULATION/BICAP);  Surgeon: Iva Boop, MD;  Location: [facility] ENDOSCOPY;  Service: Endoscopy;  Laterality: N/A;   INCISION AND DRAINAGE ABSCESS POSTERIOR CERVICALSPINE  05/2012   JOINT REPLACEMENT     MELANOMA EXCISION Left 2015   "lower leg; right at my knee"   REFRACTIVE SURGERY Right ~ 2006 X 2   "twice; both done at [facility]" (03/08/2015   SURGERY SCROTAL / TESTICULAR Right 1990's   TEE WITHOUT CARDIOVERSION N/A 01/26/2013   Procedure: TRANSESOPHAGEAL ECHOCARDIOGRAM (TEE);  Surgeon: Lewayne Bunting, MD;  Location: [facility] ENDOSCOPY;  Service: Cardiovascular;  Laterality: N/A;  [name] anes. /    TEE WITHOUT CARDIOVERSION N/A 10/05/2014   Procedure: TRANSESOPHAGEAL ECHOCARDIOGRAM (TEE)  with cardioversion;  Surgeon: Vesta Mixer, MD;  Location: [facility] ENDOSCOPY;  Service: Cardiovascular;  Laterality: N/A;  12:52 synched cardioversion at 120 joules,...afib to SR...12 lead EKG ordered.Marland KitchenMarland KitchenCardiozem d/c'ed per MD verbal order at SR   TEE WITHOUT CARDIOVERSION N/A 03/23/2015   Procedure: TRANSESOPHAGEAL ECHOCARDIOGRAM (TEE);  Surgeon: Jake Bathe, MD;  Location: [facility] ENDOSCOPY;  Service: Cardiovascular;  Laterality: N/A;   TEE WITHOUT CARDIOVERSION N/A 11/02/2019   Procedure: TRANSESOPHAGEAL ECHOCARDIOGRAM (TEE);  Surgeon: Wendall Stade, MD;  Location: [facility] ENDOSCOPY;  Service: Cardiovascular;  Laterality: N/A;   THORACIC SPINE SURGERY  03/2000   "ground calcium deposits from upper thoracic" (01/26/2013)   THYROIDECTOMY N/A 05/11/2021   Procedure: TOTAL THYROIDECTOMY;  Surgeon: Darnell Level, MD;  Location: [facility] ORS;  Service: General;  Laterality: N/A;   TOTAL  HIP ARTHROPLASTY Right 06/2007     Current Outpatient Medications  Medication Sig Dispense Refill   amiodarone (PACERONE) 200 MG tablet Take 1 tablet (200 mg total) by mouth daily. 90 tablet 1   calcium carbonate (TUMS - DOSED IN MG ELEMENTAL CALCIUM) 500 MG chewable tablet Chew 2 tablets by mouth 2 (two) times daily.     carvedilol (COREG) 3.125 MG tablet Take 1 tablet (3.125 mg total) by mouth 2 (two) times daily. 180 tablet 3   cholecalciferol (VITAMIN D3) 25 MCG (1000 UNIT) tablet Take 1,000 Units by mouth daily.     Cyanocobalamin (VITAMIN B 12 PO) Take 1 tablet by mouth daily.     dorzolamide-timolol (COSOPT) 22.3-6.8 MG/ML ophthalmic solution Place 1 drop into the right eye 2 (two) times daily.     empagliflozin (JARDIANCE) 10 MG TABS tablet Take 1 tablet (10 mg total) by mouth daily. 30 tablet 11   escitalopram (LEXAPRO) 20 MG tablet Take 1 tablet (20 mg total) by mouth daily. 90 tablet 0  Evolocumab (REPATHA SURECLICK) 140 MG/ML SOAJ INJECT 140MG  ( ) INTO SKIN EVERY 2 WEEKS 6 mL 3   ferrous sulfate 325 (65 FE) MG tablet Take 325 mg by mouth daily with breakfast.     finasteride (PROSCAR) 5 MG tablet Take 1 tablet (5 mg total) by mouth daily. 30 tablet 2   FOLIC ACID PO Take 1 tablet by mouth daily.     furosemide (LASIX) 20 MG tablet Take 1 tablet (20 mg total) by mouth 2 (two) times daily. 60 tablet 0   gabapentin (NEURONTIN) 100 MG capsule Take 200 mg by mouth 2 (two) times daily.      latanoprost (XALATAN) 0.005 % ophthalmic solution Place 1 drop into the right eye at bedtime.     levothyroxine (SYNTHROID) 137 MCG tablet Take 1 tablet (137 mcg total) by mouth daily before breakfast. 90 tablet 1   nitroGLYCERIN (NITROSTAT) 0.4 MG SL tablet Place 0.4 mg under the tongue every 5 (five) minutes as needed for chest pain.     Polyethyl Glycol-Propyl Glycol 0.4-0.3 % SOLN Place 1 drop into the left eye in the morning and at bedtime.     RHOPRESSA 0.02 % SOLN Place 1 drop into the right  eye at bedtime.     spironolactone (ALDACTONE) 25 MG tablet TAKE 1 TABLET DAILY AT BEDTIME 90 tablet 3   tamsulosin (FLOMAX) 0.4 MG CAPS Take 0.4 mg by mouth at bedtime.      XARELTO 15 MG TABS tablet TAKE (1) TABLET DAILY WITH SUPPER. 90 tablet 1   calcitRIOL (ROCALTROL) 0.25 MCG capsule Take 0.25 mcg by mouth once a week.     No current facility-administered medications for this visit.    Allergies:   Pravastatin sodium and Azithromycin   Social History:  The patient  reports that he quit smoking about 22 years ago. His smoking use included cigarettes. He has a 144.00 pack-year smoking history. He has never used smokeless tobacco. He reports that he does not drink alcohol and does not use drugs.   Family History:  The patient's family history includes Colon cancer in his paternal uncle; Diabetes in his maternal aunt, maternal grandmother, maternal uncle, and mother; Heart attack in his maternal uncle; Heart disease in his mother; Kidney disease in his mother; Leukemia in his father; Lung cancer in his paternal uncle; Prostate cancer in his paternal uncle; Thyroid disease in his sister.   ROS:  Please see the history of present illness.   Otherwise, review of systems is positive for none.   All other systems are reviewed and negative.   PHYSICAL EXAM: VS:  BP 110/68   Pulse 65   Ht 5\' 6"  (1.676 m)   Wt 217 lb (98.4 kg)   SpO2 99%   BMI 35.02 kg/m  , BMI Body mass index is 35.02 kg/m. GEN: Well nourished, well developed, in no acute distress  HEENT: normal  Neck: no JVD, carotid bruits, or masses Cardiac: RRR; no murmurs, rubs, or gallops,no edema  Respiratory:  clear to auscultation bilaterally, normal work of breathing GI: soft, nontender, nondistended, + BS MS: no deformity or atrophy  Skin: warm and dry, device site well healed Neuro:  Strength and sensation are intact Psych: euthymic mood, full affect  EKG:  EKG is ordered today. Personal review of the ekg ordered shows AV  paced  Personal review of the device interrogation today. Results in [facility]    Recent Labs: 10/10/2022: ALT 21 10/11/2022: Hemoglobin 10.4; Platelets 137 10/18/2022: B  Natriuretic Peptide 266.3 10/26/2022: BUN 28; Creatinine, Ser 2.00; Potassium 4.3; Sodium 139 11/01/2022: TSH 3.15    Lipid Panel     Component Value Date/Time   CHOL 116 11/02/2022 0000   TRIG 202 (A) 11/02/2022 0000   HDL 28 (A) 11/02/2022 0000   CHOLHDL 4.2 10/18/2022 0949   VLDL 35 10/18/2022 0949   LDLCALC 48 11/02/2022 0000     Wt Readings from Last 3 Encounters:  03/07/23 217 lb (98.4 kg)  02/06/23 214 lb 12.8 oz (97.4 kg)  01/21/23 216 lb (98 kg)      Other studies Reviewed: Additional studies/ records that were reviewed today include: TTE 03/22/21  Review of the above records today demonstrates:   1. Left ventricular ejection fraction, by estimation, is 40 to 45%. The  left ventricle has mildly decreased function. The left ventricle  demonstrates global hypokinesis. The left ventricular internal cavity size  was moderately dilated. There is mild  concentric left ventricular hypertrophy. Left ventricular diastolic  parameters are consistent with Grade III diastolic dysfunction  (restrictive).   2. Right ventricular systolic function is normal. The right ventricular  size is normal. There is mildly elevated pulmonary artery systolic  pressure.   3. Left atrial size was moderately dilated.   4. Right atrial size was moderately dilated.   5. The mitral valve is normal in structure. Trivial mitral valve  regurgitation. No evidence of mitral stenosis.   6. The aortic valve is tricuspid. Aortic valve regurgitation is trivial.    ASSESSMENT AND PLAN:  1.  Chronic systolic heart failure: Due to nonischemic cardiomyopathy.  Currently off to medical therapy per heart failure cardiology.  Status post Medtronic CRT-D.  Device functioning appropriately.  No changes.  2.  Persistent atrial fibrillation:  Currently on Xarelto and amiodarone.  CHA2DS2-VASc of 4.  Amiodarone was recently started due to recurrence of atrial fibrillation.  He is happy being on his amiodarone currently.  If he has recurrence, Keiji Melland potentially discuss ablation.  Ianna Salmela have him follow-up in A-fib clinic in 6 months.  At that time, if he remains in sinus rhythm, there would be the option to reduce amiodarone to 100 mg.  3.  Second hypercoagulable state: Currently on Xarelto for atrial fibrillation  4.  Obstructive sleep apnea: CPAP compliance encouraged  5.  High risk medication monitoring: Currently on amiodarone.  Recent labs within normal limits.   Current medicines are reviewed at length with the patient today.   The patient does not have concerns regarding his medicines.  The following changes were made today: None  Labs/ tests ordered today include:  Orders Placed This Encounter  Procedures   EKG 12-Lead     Disposition:   FU 6 months  Signed, Evelene Roussin Jorja Loa, MD  03/07/2023 9:42 AM     [facility] [address] [place] [address] [phone] (office) [phone] (fax)

## 2023-03-07 NOTE — Patient Instructions (Addendum)
Medication Instructions:  Your physician recommends that you continue on your current medications as directed. Please refer to the Current Medication list given to you today. *If you need a refill on your cardiac medications before your next appointment, please call your pharmacy*    Follow-Up: At [facility], you and your health needs are our priority.  As part of our continuing mission to provide you with exceptional heart care, we have created designated Provider Care Teams.  These Care Teams include your primary Cardiologist (physician) and Advanced Practice Providers (APPs -  Physician Assistants and Nurse Practitioners) who all work together to provide you with the care you need, when you need it.  We recommend signing up for the patient portal called "MyChart".  Sign up information is provided on this After Visit Summary.  MyChart is used to connect with patients for Virtual Visits (Telemedicine).  Patients are able to view lab/test results, encounter notes, upcoming appointments, etc.  Non-urgent messages can be sent to your provider as well.   To learn more about what you can do with MyChart, go to [email or web address].    Your next appointment:   6 month(s)  Provider:   You will follow up in the Atrial Fibrillation Clinic located at [facility]. Your provider will be: Clint R. Fenton, PA-C  

## 2023-03-19 NOTE — Progress Notes (Signed)
Remote ICD transmission.   

## 2023-03-29 ENCOUNTER — Ambulatory Visit (INDEPENDENT_AMBULATORY_CARE_PROVIDER_SITE_OTHER): Payer: Medicare PPO

## 2023-03-29 DIAGNOSIS — I426 Alcoholic cardiomyopathy: Secondary | ICD-10-CM

## 2023-03-29 LAB — CUP PACEART REMOTE DEVICE CHECK
Battery Remaining Longevity: 1 mo
Battery Voltage: 2.76 V
Brady Statistic AP VP Percent: 99.81 %
Brady Statistic AP VS Percent: 0.07 %
Brady Statistic AS VP Percent: 0.11 %
Brady Statistic AS VS Percent: 0.01 %
Brady Statistic RA Percent Paced: 99.15 %
Brady Statistic RV Percent Paced: 99.38 %
Date Time Interrogation Session: 20240628022823
HighPow Impedance: 84 Ohm
Implantable Lead Connection Status: 753985
Implantable Lead Connection Status: 753985
Implantable Lead Connection Status: 753985
Implantable Lead Implant Date: 20160607
Implantable Lead Implant Date: 20160607
Implantable Lead Implant Date: 20160607
Implantable Lead Location: 753858
Implantable Lead Location: 753859
Implantable Lead Location: 753860
Implantable Lead Model: 4598
Implantable Lead Model: 5076
Implantable Pulse Generator Implant Date: 20160607
Lead Channel Impedance Value: 1083 Ohm
Lead Channel Impedance Value: 1102 Ohm
Lead Channel Impedance Value: 1311 Ohm
Lead Channel Impedance Value: 1368 Ohm
Lead Channel Impedance Value: 1539 Ohm
Lead Channel Impedance Value: 1786 Ohm
Lead Channel Impedance Value: 342 Ohm
Lead Channel Impedance Value: 456 Ohm
Lead Channel Impedance Value: 494 Ohm
Lead Channel Impedance Value: 570 Ohm
Lead Channel Impedance Value: 703 Ohm
Lead Channel Impedance Value: 912 Ohm
Lead Channel Impedance Value: 950 Ohm
Lead Channel Pacing Threshold Amplitude: 0.5 V
Lead Channel Pacing Threshold Amplitude: 0.625 V
Lead Channel Pacing Threshold Amplitude: 1.375 V
Lead Channel Pacing Threshold Pulse Width: 0.4 ms
Lead Channel Pacing Threshold Pulse Width: 0.4 ms
Lead Channel Pacing Threshold Pulse Width: 0.8 ms
Lead Channel Sensing Intrinsic Amplitude: 13.875 mV
Lead Channel Sensing Intrinsic Amplitude: 13.875 mV
Lead Channel Sensing Intrinsic Amplitude: 2.625 mV
Lead Channel Sensing Intrinsic Amplitude: 2.625 mV
Lead Channel Setting Pacing Amplitude: 1.5 V
Lead Channel Setting Pacing Amplitude: 2 V
Lead Channel Setting Pacing Amplitude: 2 V
Lead Channel Setting Pacing Pulse Width: 0.4 ms
Lead Channel Setting Pacing Pulse Width: 0.8 ms
Lead Channel Setting Sensing Sensitivity: 0.3 mV
Zone Setting Status: 755011
Zone Setting Status: 755011

## 2023-04-01 ENCOUNTER — Ambulatory Visit: Payer: Medicare PPO | Attending: Cardiology

## 2023-04-01 DIAGNOSIS — I5022 Chronic systolic (congestive) heart failure: Secondary | ICD-10-CM

## 2023-04-01 DIAGNOSIS — Z95810 Presence of automatic (implantable) cardiac defibrillator: Secondary | ICD-10-CM

## 2023-04-03 ENCOUNTER — Inpatient Hospital Stay: Payer: Medicare PPO | Attending: Physician Assistant

## 2023-04-03 DIAGNOSIS — R918 Other nonspecific abnormal finding of lung field: Secondary | ICD-10-CM | POA: Insufficient documentation

## 2023-04-03 DIAGNOSIS — C439 Malignant melanoma of skin, unspecified: Secondary | ICD-10-CM

## 2023-04-03 DIAGNOSIS — D696 Thrombocytopenia, unspecified: Secondary | ICD-10-CM | POA: Insufficient documentation

## 2023-04-03 DIAGNOSIS — E538 Deficiency of other specified B group vitamins: Secondary | ICD-10-CM

## 2023-04-03 DIAGNOSIS — Z85820 Personal history of malignant melanoma of skin: Secondary | ICD-10-CM | POA: Insufficient documentation

## 2023-04-03 DIAGNOSIS — Z87891 Personal history of nicotine dependence: Secondary | ICD-10-CM | POA: Diagnosis not present

## 2023-04-03 DIAGNOSIS — Z85850 Personal history of malignant neoplasm of thyroid: Secondary | ICD-10-CM | POA: Insufficient documentation

## 2023-04-03 DIAGNOSIS — D631 Anemia in chronic kidney disease: Secondary | ICD-10-CM | POA: Diagnosis not present

## 2023-04-03 DIAGNOSIS — N1832 Chronic kidney disease, stage 3b: Secondary | ICD-10-CM | POA: Insufficient documentation

## 2023-04-03 DIAGNOSIS — Z79899 Other long term (current) drug therapy: Secondary | ICD-10-CM | POA: Diagnosis not present

## 2023-04-03 LAB — CBC WITH DIFFERENTIAL/PLATELET
Abs Immature Granulocytes: 0.03 10*3/uL (ref 0.00–0.07)
Basophils Absolute: 0.1 10*3/uL (ref 0.0–0.1)
Basophils Relative: 1 %
Eosinophils Absolute: 0.2 10*3/uL (ref 0.0–0.5)
Eosinophils Relative: 3 %
HCT: 36.8 % — ABNORMAL LOW (ref 39.0–52.0)
Hemoglobin: 12 g/dL — ABNORMAL LOW (ref 13.0–17.0)
Immature Granulocytes: 0 %
Lymphocytes Relative: 13 %
Lymphs Abs: 0.9 10*3/uL (ref 0.7–4.0)
MCH: 32.9 pg (ref 26.0–34.0)
MCHC: 32.6 g/dL (ref 30.0–36.0)
MCV: 100.8 fL — ABNORMAL HIGH (ref 80.0–100.0)
Monocytes Absolute: 0.6 10*3/uL (ref 0.1–1.0)
Monocytes Relative: 8 %
Neutro Abs: 5.2 10*3/uL (ref 1.7–7.7)
Neutrophils Relative %: 75 %
Platelets: 167 10*3/uL (ref 150–400)
RBC: 3.65 MIL/uL — ABNORMAL LOW (ref 4.22–5.81)
RDW: 14.8 % (ref 11.5–15.5)
WBC: 6.8 10*3/uL (ref 4.0–10.5)
nRBC: 0 % (ref 0.0–0.2)

## 2023-04-03 LAB — COMPREHENSIVE METABOLIC PANEL
ALT: 19 U/L (ref 0–44)
AST: 12 U/L — ABNORMAL LOW (ref 15–41)
Albumin: 3.6 g/dL (ref 3.5–5.0)
Alkaline Phosphatase: 65 U/L (ref 38–126)
Anion gap: 8 (ref 5–15)
BUN: 35 mg/dL — ABNORMAL HIGH (ref 8–23)
CO2: 27 mmol/L (ref 22–32)
Calcium: 9.1 mg/dL (ref 8.9–10.3)
Chloride: 101 mmol/L (ref 98–111)
Creatinine, Ser: 2.46 mg/dL — ABNORMAL HIGH (ref 0.61–1.24)
GFR, Estimated: 26 mL/min — ABNORMAL LOW (ref >60)
Glucose, Bld: 149 mg/dL — ABNORMAL HIGH (ref 70–99)
Potassium: 4.6 mmol/L (ref 3.5–5.1)
Sodium: 136 mmol/L (ref 135–145)
Total Bilirubin: 0.6 mg/dL (ref 0.3–1.2)
Total Protein: 7.4 g/dL (ref 6.5–8.1)

## 2023-04-03 LAB — VITAMIN B12: Vitamin B-12: 496 pg/mL (ref 180–914)

## 2023-04-03 LAB — IRON AND TIBC
Iron: 54 ug/dL (ref 45–182)
Saturation Ratios: 21 % (ref 17.9–39.5)
TIBC: 264 ug/dL (ref 250–450)
UIBC: 210 ug/dL

## 2023-04-03 LAB — FOLATE: Folate: 14 ng/mL (ref >5.9)

## 2023-04-03 LAB — FERRITIN: Ferritin: 268 ng/mL (ref 24–336)

## 2023-04-03 NOTE — Progress Notes (Signed)
EPIC Encounter for ICM Monitoring  Patient Name: Chad Brown is a 82 y.o. male Date: 04/03/2023 Primary Care Physican: Cleatis Polka., MD Primary Cardiologist: Nishan/McLean Electrophysiologist: Kathreen Cornfield Pacing: 98.7%   12/19/2022 Weight: 210-211 lbs 01/23/2023 Weight: 211.6 lbs   Clinical Status Since 29-Mar-2023 Time in AT/AF 0.0 hr/day (0.0%))   Battery Longevity: 1 month      Spoke with patient and heart failure questions reviewed.  Transmission results reviewed.  Pt asymptomatic for fluid accumulation.  Reports feeling well at this time and voices no complaints.     Optivol thoracic impedance suggesting normal fluid levels.  Device clinic is monitoring lead impedance and close to ERI.    Prescribed:  Furosemide 40 mg 1 tablet (40 mg total) by mouth in the morning and 0.5 tablet (20 mg total)  every evening  20 mg  Spironolactone 25 mg Take 1 tablet (25 mg total) by mouth daily. Take at bedtime   Labs: 03/15/2022 Creatine 2.01, BUN 26, Potassium 4.5, Sodium 139, GFR 33 A complete set of results can be found in Results Review.   Recommendations:  No changes and encouraged to call if experiencing any fluid symptoms.   Follow-up plan: ICM clinic phone appointment on 05/06/2023.  91 day device clinic remote transmission 05/30/2023.     EP/Cardiology Next Office Visit:    Recall 05/28/2023 with Dr Shirlee Latch.     Copy of ICM check sent to Dr. Elberta Fortis.    3 month ICM trend: 04/01/2023.    12-14 Month ICM trend:     Karie Soda, RN 04/03/2023 8:20 AM

## 2023-04-05 ENCOUNTER — Telehealth: Payer: Self-pay

## 2023-04-05 NOTE — Telephone Encounter (Signed)
Spoke with patient.  Advised received manual transmission but the alert for ERI is not showing on today's report.  He is feeling fine.  I will forward call to device clinic triage for review.   Advised will update him after reviewed by triage.

## 2023-04-05 NOTE — Telephone Encounter (Signed)
Called tech services with Medtr[employer] states the LV impedance was 1501 ohms on 03/20/23 and the alarm was not reset in [employer]. Trends are stable and known. Went to carelink alert settings and reset. Patient will send a manual transmission in 1 hour which will clear alarm per [employer] tech services. Patient aware and appreciative of call.

## 2023-04-05 NOTE — Telephone Encounter (Signed)
Patient called stated he has been hearing an audible tone from device and thinks it has reached battery replacement. He has heard a tone for the past 4 days every morning around the same time.  Battery Longevity was at 1 month on 7/1 report.   Advised to send a manual transmission for review and will call him back.  Pt had OV with Dr Elberta Fortis on 6/6 and discussed battery replacement.

## 2023-04-08 LAB — METHYLMALONIC ACID, SERUM: Methylmalonic Acid, Quantitative: 304 nmol/L (ref 0–378)

## 2023-04-09 NOTE — Progress Notes (Unsigned)
[facility] [address], [place] [address]   CLINIC:  Medical Oncology/Hematology  PCP:  Cleatis Polka., MD [address] [place] [address] [phone]   REASON FOR VISIT:  Follow-up for normocytic anemia and thrombocytopenia   CURRENT THERAPY: Iron Gummies, B12/folate supplements  INTERVAL HISTORY:   [name] 82 y.o. male returns for routine follow-up of anemia and thrombocytopenia.  He was last seen by Rojelio Brenner PA-C on 10/02/2022.  At today's visit, he reports feeling fair.  No recent hospitalizations, surgeries, or changes in baseline health status.  He is taking iron supplement daily at home.  He has been taking B12 500 mcg daily and folic acid 400 mcg daily since his visit in June 2023.  He reports some mild fatigue with baseline energy about 50%.  He admits to mild cough as well as some diarrhea, which are chronic/intermittent.  He denies any B symptoms such as fever, chills, night sweats, unintentional weight loss.   No shortness of breath, chest pain, nausea, vomiting, abdominal pain.   He has not noticed any recent bleeding events such as hematemesis, hematochezia, or melena.   He does bruise easily, but notes that he is also on Xarelto for atrial fibrillation.  He has 50% energy and 100% appetite. He endorses that he is maintaining a stable weight.   ASSESSMENT & PLAN:  1.  Normocytic anemia: - Combination anemia from CKD and relative iron deficiency. - Colonoscopy (08/07/2016): Polyps x 3 (tubular adenomas), diverticulosis, internal hemorrhoids - Small bowel enteroscopy (08/17/2015): Esophageal polyp, gastritis, scattered xanthomas and small bowel otherwise normal enteroscopy with no cause of blood loss/anemia seen - He takes a daily iron gummy, B12 500 mcg, folate 400 mcg - No bright red blood per rectum or melena - Most recent labs (04/03/2023): Hgb 12.0/MCV 100.8.  Ferritin 268, iron saturation 21%. Baseline CKD stage  IIIb/IV (creatinine 2.46/GFR 26).  Normal B12, MMA, folate. - PLAN: Hemoglobin at goal. - Continue daily iron supplement plus vitamin B12 500 mcg daily and folic acid 400 mcg daily. - Repeat labs and RTC in 6 months -- Would consider ESA if Hgb < 9.5 with normal iron stores   2.  Mild thrombocytopenia: - He has intermittent mild thrombocytopenia for several years duration. -- B12 and folate deficiency noted June 2023 - He takes a daily iron gummy, B12 500 mcg, folate 400 mcg  - CBC from 04/03/2023 shows normal platelets 167 (normal) -- DIFFERENTIAL DIAGNOSIS: Thrombocytopenia secondary to nutritional deficiencies, versus mild ITP, versus early MDS - PLAN: Continue vitamin B12 500 mcg daily and folic acid 400 mcg daily. - Repeat labs and RTC in 6 months   3.  Lung nodules: - He is a former smoker, smoked more than 2 packs/day for many years and quit in 2001. - Follows with [facility] for annual CT chest, most recently completed on 06/11/2022   4.  T2 N0 melanoma of the left thigh: - Status post resection in 2015 at [facility].   5.  Papillary thyroid carcinoma: - Thyroid surgery by Dr. Gerrit Friends (total thyroidectomy 05/11/2021) - He had radioactive iodine pill in January 2023 under the direction of Dr. Everardo All and Dr. Amil Amen - Follows with endocrinologist, Dr. Fransico Him.   6.  CKD stage IIIb/IV - Follows with Dr. Wolfgang Phoenix.   7.  Other history - PMH: Paroxysmal atrial fibrillation (on Xarelto), hypertension, hyperlipidemia, systolic CHF, sleep apnea, coronary artery disease   PLAN SUMMARY: >> Labs in 6 months (  CBC/D, CMP, ferritin, iron/TIBC, B12, MMA, folate) >> Office visit 1 week after labs     REVIEW OF SYSTEMS:   Review of Systems  Constitutional:  Positive for fatigue. Negative for appetite change, chills, diaphoresis, fever and unexpected weight change.  HENT:   Negative for lump/mass and nosebleeds.   Eyes:  Negative for eye problems.  Respiratory:  Positive for cough and  shortness of breath (with exertion). Negative for hemoptysis.   Cardiovascular:  Negative for chest pain, leg swelling and palpitations.  Gastrointestinal:  Positive for constipation and diarrhea. Negative for abdominal pain, blood in stool, nausea and vomiting.  Genitourinary:  Negative for hematuria.   Musculoskeletal:  Positive for arthralgias and myalgias.  Skin: Negative.   Neurological:  Positive for dizziness and numbness. Negative for headaches and light-headedness.  Hematological:  Does not bruise/bleed easily.     PHYSICAL EXAM:  ECOG PERFORMANCE STATUS: 2 - Symptomatic, <50% confined to bed  Vitals:   04/10/23 0819  BP: 111/72  Pulse: 74  Resp: 18  Temp: 97.6 F (36.4 C)  SpO2: 95%   Filed Weights   04/10/23 0819  Weight: 215 lb 6.2 oz (97.7 kg)   Physical Exam Vitals reviewed.  Constitutional:      Appearance: Normal appearance. He is obese.  Cardiovascular:     Rate and Rhythm: Normal rate and regular rhythm.     Pulses: Normal pulses.     Heart sounds: Normal heart sounds.  Pulmonary:     Effort: Pulmonary effort is normal.     Breath sounds: Normal breath sounds.  Neurological:     General: No focal deficit present.     Mental Status: He is alert and oriented to person, place, and time.  Psychiatric:        Mood and Affect: Mood normal.        Behavior: Behavior normal.     PAST MEDICAL/SURGICAL HISTORY:  Past Medical History:  Diagnosis Date   Adenomatous colon polyp    tubular   AICD (automatic cardioverter/defibrillator) present 03/08/2015   MDT CRTD dual pacemaker and defib   Anemia    iron deficient   Arthritis    "about all my joints; hands, knees, back" (03/08/2015)   Atherosclerosis    Cataract    left eye small   Cholelithiasis    gallstones   Chronic systolic CHF (congestive heart failure) (HCC)    a. New dx 12/2012 ? NICM, may be r/t afib. b. Nuc 03/2013 - normal;  c. 03/2015 TEE EF 15-20%.   Colon polyp, hyperplastic 01/2015    removed precancerous lesions   Depression    Diverticulosis    Dysrhythmia    afib   GERD (gastroesophageal reflux disease)    Glaucoma    right eye   Hyperlipidemia    Hypertension    Melanoma of eye (HCC) 2000's   "right; it's never been biopsied"   Melanoma of lower leg (HCC) 2015   "left; right at my knee"   Myocardial infarction (HCC) 1998   OSA (obstructive sleep apnea) 01/04/2016   no longer tolerates cpap   Peripheral vision loss, right 2006   Persistent atrial fibrillation (HCC)    a. Dx 12/2012, s/p TEE/DCCV 01/26/13. b. On Xarelto (CHA2DS2VASc = 3);  c. 03/2015 TEE (EF 15-20%, no LAA thrombus) and DCCV - amio increased to 200 mg bid.   Pneumonia    Urinary hesitancy due to benign prostatic hypertrophy    Past Surgical History:  Procedure  Laterality Date   ATRIAL FIBRILLATION ABLATION N/A 11/03/2019   Procedure: ATRIAL FIBRILLATION ABLATION;  Surgeon: Hillis Range, MD;  Location: [facility] INVASIVE CV LAB;  Service: Cardiovascular;  Laterality: N/A;   BACK SURGERY     upper back, cannot turn neck well   CARDIAC CATHETERIZATION  1998   CARDIOVERSION N/A 01/26/2013   Procedure: CARDIOVERSION;  Surgeon: Lewayne Bunting, MD;  Location: [facility] ENDOSCOPY;  Service: Cardiovascular;  Laterality: N/A;   CARDIOVERSION N/A 03/23/2015   Procedure: CARDIOVERSION;  Surgeon: Jake Bathe, MD;  Location: [facility] ENDOSCOPY;  Service: Cardiovascular;  Laterality: N/A;   CARDIOVERSION N/A 08/14/2017   Procedure: CARDIOVERSION;  Surgeon: Wendall Stade, MD;  Location: [facility] ENDOSCOPY;  Service: Cardiovascular;  Laterality: N/A;   CATARACT EXTRACTION Right ~ 2006   COLONOSCOPY WITH PROPOFOL N/A 02/10/2015   Procedure: COLONOSCOPY WITH PROPOFOL;  Surgeon: Iva Boop, MD;  Location: [facility] ENDOSCOPY;  Service: Endoscopy;  Laterality: N/A;   COLONOSCOPY WITH PROPOFOL N/A 08/07/2016   Procedure: COLONOSCOPY WITH PROPOFOL;  Surgeon: Iva Boop, MD;  Location: [facility] ENDOSCOPY;  Service: Endoscopy;  Laterality: N/A;    ENTEROSCOPY N/A 08/17/2015   Procedure: ENTEROSCOPY;  Surgeon: Iva Boop, MD;  Location: [facility] ENDOSCOPY;  Service: Endoscopy;  Laterality: N/A;   EP IMPLANTABLE DEVICE N/A 03/08/2015   MDT Ovidio Kin CRT-D for nonischemic CM by Dr Johney Frame for primary prevention   GLAUCOMA SURGERY Right ~ 2006   "put 3 stents in to drain fluid" (03/08/2015) not successful, sent to [facility] to try to get last stent out   HOT HEMOSTASIS N/A 08/07/2016   Procedure: HOT HEMOSTASIS (ARGON PLASMA COAGULATION/BICAP);  Surgeon: Iva Boop, MD;  Location: [facility] ENDOSCOPY;  Service: Endoscopy;  Laterality: N/A;   INCISION AND DRAINAGE ABSCESS POSTERIOR CERVICALSPINE  05/2012   JOINT REPLACEMENT     MELANOMA EXCISION Left 2015   "lower leg; right at my knee"   REFRACTIVE SURGERY Right ~ 2006 X 2   "twice; both done at [facility]" (03/08/2015   SURGERY SCROTAL / TESTICULAR Right 1990's   TEE WITHOUT CARDIOVERSION N/A 01/26/2013   Procedure: TRANSESOPHAGEAL ECHOCARDIOGRAM (TEE);  Surgeon: Lewayne Bunting, MD;  Location: [facility] ENDOSCOPY;  Service: Cardiovascular;  Laterality: N/A;  [name] anes. /    TEE WITHOUT CARDIOVERSION N/A 10/05/2014   Procedure: TRANSESOPHAGEAL ECHOCARDIOGRAM (TEE)  with cardioversion;  Surgeon: Vesta Mixer, MD;  Location: [facility] ENDOSCOPY;  Service: Cardiovascular;  Laterality: N/A;  12:52 synched cardioversion at 120 joules,...afib to SR...12 lead EKG ordered.Marland KitchenMarland KitchenCardiozem d/c'ed per MD verbal order at SR   TEE WITHOUT CARDIOVERSION N/A 03/23/2015   Procedure: TRANSESOPHAGEAL ECHOCARDIOGRAM (TEE);  Surgeon: Jake Bathe, MD;  Location: [facility] ENDOSCOPY;  Service: Cardiovascular;  Laterality: N/A;   TEE WITHOUT CARDIOVERSION N/A 11/02/2019   Procedure: TRANSESOPHAGEAL ECHOCARDIOGRAM (TEE);  Surgeon: Wendall Stade, MD;  Location: [facility] ENDOSCOPY;  Service: Cardiovascular;  Laterality: N/A;   THORACIC SPINE SURGERY  03/2000   "ground calcium deposits from upper thoracic" (01/26/2013)   THYROIDECTOMY N/A 05/11/2021   Procedure:  TOTAL THYROIDECTOMY;  Surgeon: Darnell Level, MD;  Location: [facility] ORS;  Service: General;  Laterality: N/A;   TOTAL HIP ARTHROPLASTY Right 06/2007    SOCIAL HISTORY:  Social History   Socioeconomic History   Marital status: Married    Spouse name: Not on file   Number of children: 3   Years of education: Not on file   Highest education level: Not on file  Occupational History   Occupation: retired  Tobacco Use   Smoking status: Former    Packs/day: 3.00    Years: 48.00    Additional pack years: 0.00    Total pack years: 144.00    Types: Cigarettes    Quit date: 09/28/2000    Years since quitting: 22.5   Smokeless tobacco: Never  Vaping Use   Vaping Use: Never used  Substance and Sexual Activity   Alcohol use: No   Drug use: No   Sexual activity: Not Currently  Other Topics Concern   Not on file  Social History Narrative   Pt lives in [place] with spouse.  3 children are grown and healthy.   Retired.  Ran a country store for 30 years, previously worked in the [employer] for 16 years   Social Determinants of Corporate investment banker Strain: Not on [employer] Insecurity: Not on file  Transportation Needs: No Transportation Needs (11/03/2020)   PRAPARE - Administrator, Civil Service (Medical): No    Lack of Transportation (Non-Medical): No  Physical Activity: Inactive (11/03/2020)   Exercise Vital Sign    Days of Exercise per Week: 0 days    Minutes of Exercise per Session: 0 min  Stress: Not on file  Social Connections: Not on file  Intimate Partner Violence: Not At Risk (10/11/2022)   Humiliation, Afraid, Rape, and Kick questionnaire    Fear of Current or Ex-Partner: No    Emotionally Abused: No    Physically Abused: No    Sexually Abused: No    FAMILY HISTORY:  Family History  Problem Relation Age of Onset   Kidney disease Mother    Heart disease Mother        MI, open heart   Diabetes Mother        dialysis   Leukemia Father     Colon cancer Paternal Uncle    Lung cancer Paternal Uncle        x 2   Prostate cancer Paternal Uncle    Diabetes Maternal Grandmother    Heart attack Maternal Uncle    Diabetes Maternal Aunt        x 3   Diabetes Maternal Uncle    Thyroid disease Sister     CURRENT MEDICATIONS:  Outpatient Encounter Medications as of 04/10/2023  Medication Sig   amiodarone (PACERONE) 200 MG tablet Take 1 tablet (200 mg total) by mouth daily.   calcium carbonate (TUMS - DOSED IN MG ELEMENTAL CALCIUM) 500 MG chewable tablet Chew 2 tablets by mouth 2 (two) times daily.   carvedilol (COREG) 3.125 MG tablet Take 1 tablet (3.125 mg total) by mouth 2 (two) times daily.   cholecalciferol (VITAMIN D3) 25 MCG (1000 UNIT) tablet Take 1,000 Units by mouth daily.   Cyanocobalamin (VITAMIN B 12 PO) Take 1 tablet by mouth daily.   dorzolamide-timolol (COSOPT) 22.3-6.8 MG/ML ophthalmic solution Place 1 drop into the right eye 2 (two) times daily.   empagliflozin (JARDIANCE) 10 MG TABS tablet Take 1 tablet (10 mg total) by mouth daily.   escitalopram (LEXAPRO) 20 MG tablet Take 1 tablet (20 mg total) by mouth daily.   Evolocumab (REPATHA SURECLICK) 140 MG/ML SOAJ INJECT 140MG  ( ) INTO SKIN EVERY 2 WEEKS   ferrous sulfate 325 (65 FE) MG tablet Take 325 mg by mouth daily with breakfast.   finasteride (PROSCAR) 5 MG tablet Take 1 tablet (5 mg total) by mouth daily.   FOLIC ACID PO Take 1 tablet by mouth  daily.   furosemide (LASIX) 20 MG tablet Take 1 tablet (20 mg total) by mouth 2 (two) times daily.   gabapentin (NEURONTIN) 100 MG capsule Take 200 mg by mouth 2 (two) times daily.    latanoprost (XALATAN) 0.005 % ophthalmic solution Place 1 drop into the right eye at bedtime.   levothyroxine (SYNTHROID) 137 MCG tablet Take 1 tablet (137 mcg total) by mouth daily before breakfast.   nitroGLYCERIN (NITROSTAT) 0.4 MG SL tablet Place 0.4 mg under the tongue every 5 (five) minutes as needed for chest pain.   Polyethyl  Glycol-Propyl Glycol 0.4-0.3 % SOLN Place 1 drop into the left eye in the morning and at bedtime.   RHOPRESSA 0.02 % SOLN Place 1 drop into the right eye at bedtime.   spironolactone (ALDACTONE) 25 MG tablet TAKE 1 TABLET DAILY AT BEDTIME   tamsulosin (FLOMAX) 0.4 MG CAPS Take 0.4 mg by mouth at bedtime.    XARELTO 15 MG TABS tablet TAKE (1) TABLET DAILY WITH SUPPER.   [DISCONTINUED] calcitRIOL (ROCALTROL) 0.25 MCG capsule Take 0.25 mcg by mouth once a week.   No facility-administered encounter medications on file as of 04/10/2023.    ALLERGIES:  Allergies  Allergen Reactions   Pravastatin Sodium Other (See Comments)    Joint and muscle pain   Azithromycin Diarrhea    LABORATORY DATA:  I have reviewed the labs as listed.  CBC    Component Value Date/Time   WBC 6.8 04/03/2023 0925   RBC 3.65 (L) 04/03/2023 0925   HGB 12.0 (L) 04/03/2023 0925   HCT 36.8 (L) 04/03/2023 0925   PLT 167 04/03/2023 0925   MCV 100.8 (H) 04/03/2023 0925   MCH 32.9 04/03/2023 0925   MCHC 32.6 04/03/2023 0925   RDW 14.8 04/03/2023 0925   LYMPHSABS 0.9 04/03/2023 0925   MONOABS 0.6 04/03/2023 0925   EOSABS 0.2 04/03/2023 0925   BASOSABS 0.1 04/03/2023 0925      Latest Ref Rng & Units 04/03/2023    9:25 AM 10/26/2022   10:09 AM 10/18/2022    9:49 AM  CMP  Glucose 70 - 99 mg/dL 098  119  147   BUN 8 - 23 mg/dL 35  28  34   Creatinine 0.61 - 1.24 mg/dL 8.29  5.62  1.30   Sodium 135 - 145 mmol/L 136  139  140   Potassium 3.5 - 5.1 mmol/L 4.6  4.3  4.0   Chloride 98 - 111 mmol/L 101  102  103   CO2 22 - 32 mmol/L 27  29  25    Calcium 8.9 - 10.3 mg/dL 9.1  9.0  8.9   Total Protein 6.5 - 8.1 g/dL 7.4     Total Bilirubin 0.3 - 1.2 mg/dL 0.6     Alkaline Phos 38 - 126 U/L 65     AST 15 - 41 U/L 12     ALT 0 - 44 U/L 19       DIAGNOSTIC IMAGING:  I have independently reviewed the relevant imaging and discussed with the patient.   WRAP UP:  All questions were answered. The patient knows to call the  clinic with any problems, questions or concerns.  Medical decision making: Moderate  Time spent on visit: I spent 20 minutes counseling the patient face to face. The total time spent in the appointment was 30 minutes and more than 50% was on counseling.  Carnella Guadalajara, PA-C  04/10/23 9:05 AM

## 2023-04-10 ENCOUNTER — Inpatient Hospital Stay: Payer: Medicare PPO | Admitting: Physician Assistant

## 2023-04-10 VITALS — BP 111/72 | HR 74 | Temp 97.6°F | Resp 18 | Wt 215.4 lb

## 2023-04-10 DIAGNOSIS — Z85850 Personal history of malignant neoplasm of thyroid: Secondary | ICD-10-CM | POA: Diagnosis not present

## 2023-04-10 DIAGNOSIS — Z87891 Personal history of nicotine dependence: Secondary | ICD-10-CM | POA: Diagnosis not present

## 2023-04-10 DIAGNOSIS — D631 Anemia in chronic kidney disease: Secondary | ICD-10-CM | POA: Diagnosis not present

## 2023-04-10 DIAGNOSIS — Z79899 Other long term (current) drug therapy: Secondary | ICD-10-CM | POA: Diagnosis not present

## 2023-04-10 DIAGNOSIS — E538 Deficiency of other specified B group vitamins: Secondary | ICD-10-CM | POA: Diagnosis not present

## 2023-04-10 DIAGNOSIS — D696 Thrombocytopenia, unspecified: Secondary | ICD-10-CM | POA: Diagnosis not present

## 2023-04-10 DIAGNOSIS — R918 Other nonspecific abnormal finding of lung field: Secondary | ICD-10-CM | POA: Diagnosis not present

## 2023-04-10 DIAGNOSIS — N1832 Chronic kidney disease, stage 3b: Secondary | ICD-10-CM

## 2023-04-10 DIAGNOSIS — Z85820 Personal history of malignant melanoma of skin: Secondary | ICD-10-CM | POA: Diagnosis not present

## 2023-04-10 NOTE — Patient Instructions (Signed)
[facility] at [facility] Discharge Instructions  You were seen today by Rojelio Brenner PA-C for your anemia and low platelets.    Your mild anemia is stable with hemoglobin 12.0.  This is related to underlying chronic kidney disease.  We will continue to monitor your blood counts but you do not need treatment for your anemia at this time.  Your platelets are normal (> 150), but have previously been mildly low. You are not at risk of severe bleeding at this level.  You do not need any treatment for your low platelets at this time, but we will continue to monitor.  Continue taking iron gummy supplement daily + Vitamin B12 and folic acid daily.  LABS: Return in 6 months for repeat labs  FOLLOW-UP APPOINTMENT: Office visit in 6 months, after labs  ** Thank you for trusting me with your healthcare!  I strive to provide all of my patients with quality care at each visit.  If you receive a survey for this visit, I would be so grateful to you for taking the time to provide feedback.  Thank you in advance!  ~ Evian Derringer                   Dr. Doreatha Massed   &   Rojelio Brenner, PA-C   - - - - - - - - - - - - - - - - - -     Thank you for choosing [facility] at [facility] to provide your oncology and hematology care.  To afford each patient quality time with our provider, please arrive at least 15 minutes before your scheduled appointment time.   If you have a lab appointment with the Cancer Center please come in thru the Main Entrance and check in at the main information desk.  You need to re-schedule your appointment should you arrive 10 or more minutes late.  We strive to give you quality time with our providers, and arriving late affects you and other patients whose appointments are after yours.  Also, if you no show three or more times for appointments you may be dismissed from the clinic at the providers discretion.     Again, thank  you for choosing [facility].  Our hope is that these requests will decrease the amount of time that you wait before being seen by our physicians.       _____________________________________________________________  Should you have questions after your visit to [facility], please contact our office at [phone] and follow the prompts.  Our office hours are 8:00 a.m. and 4:30 p.m. Monday - Friday.  Please note that voicemails left after 4:00 p.m. may not be returned until the following business day.  We are closed weekends and major holidays.  You do have access to a nurse 24-7, just call the main number to the clinic [phone] and do not press any options, hold on the line and a nurse will answer the phone.    For prescription refill requests, have your pharmacy contact our office and allow 72 hours.    Due to Covid, you will need to wear a mask upon entering the hospital. If you do not have a mask, a mask will be given to you at the Main Entrance upon arrival. For doctor visits, patients may have 1 support person age 37 or older with them. For treatment visits, patients can not have anyone with  them due to social distancing guidelines and our immunocompromised population.

## 2023-04-11 NOTE — Progress Notes (Signed)
Remote ICD transmission.   

## 2023-04-22 DIAGNOSIS — D485 Neoplasm of uncertain behavior of skin: Secondary | ICD-10-CM | POA: Diagnosis not present

## 2023-04-22 DIAGNOSIS — Z08 Encounter for follow-up examination after completed treatment for malignant neoplasm: Secondary | ICD-10-CM | POA: Diagnosis not present

## 2023-04-22 DIAGNOSIS — X32XXXD Exposure to sunlight, subsequent encounter: Secondary | ICD-10-CM | POA: Diagnosis not present

## 2023-04-22 DIAGNOSIS — Z85820 Personal history of malignant melanoma of skin: Secondary | ICD-10-CM | POA: Diagnosis not present

## 2023-04-22 DIAGNOSIS — Z1283 Encounter for screening for malignant neoplasm of skin: Secondary | ICD-10-CM | POA: Diagnosis not present

## 2023-04-22 DIAGNOSIS — D225 Melanocytic nevi of trunk: Secondary | ICD-10-CM | POA: Diagnosis not present

## 2023-04-22 DIAGNOSIS — L570 Actinic keratosis: Secondary | ICD-10-CM | POA: Diagnosis not present

## 2023-04-22 DIAGNOSIS — L814 Other melanin hyperpigmentation: Secondary | ICD-10-CM | POA: Diagnosis not present

## 2023-04-29 ENCOUNTER — Ambulatory Visit (INDEPENDENT_AMBULATORY_CARE_PROVIDER_SITE_OTHER): Payer: Medicare PPO

## 2023-04-29 DIAGNOSIS — I426 Alcoholic cardiomyopathy: Secondary | ICD-10-CM

## 2023-04-29 DIAGNOSIS — I5022 Chronic systolic (congestive) heart failure: Secondary | ICD-10-CM

## 2023-04-29 LAB — CUP PACEART REMOTE DEVICE CHECK
Battery Remaining Longevity: 1 mo
Battery Voltage: 2.75 V
Brady Statistic AP VP Percent: 99.68 %
Brady Statistic AP VS Percent: 0.07 %
Brady Statistic AS VP Percent: 0.24 %
Brady Statistic AS VS Percent: 0.02 %
Brady Statistic RA Percent Paced: 98.38 %
Brady Statistic RV Percent Paced: 98.9 %
Date Time Interrogation Session: 20240729052607
HighPow Impedance: 87 Ohm
Implantable Lead Connection Status: 753985
Implantable Lead Connection Status: 753985
Implantable Lead Connection Status: 753985
Implantable Lead Implant Date: 20160607
Implantable Lead Implant Date: 20160607
Implantable Lead Implant Date: 20160607
Implantable Lead Location: 753858
Implantable Lead Location: 753859
Implantable Lead Location: 753860
Implantable Lead Model: 4598
Implantable Lead Model: 5076
Implantable Pulse Generator Implant Date: 20160607
Lead Channel Impedance Value: 1026 Ohm
Lead Channel Impedance Value: 1216 Ohm
Lead Channel Impedance Value: 1406 Ohm
Lead Channel Impedance Value: 1539 Ohm
Lead Channel Impedance Value: 1672 Ohm
Lead Channel Impedance Value: 380 Ohm
Lead Channel Impedance Value: 494 Ohm
Lead Channel Impedance Value: 532 Ohm
Lead Channel Impedance Value: 589 Ohm
Lead Channel Impedance Value: 665 Ohm
Lead Channel Impedance Value: 836 Ohm
Lead Channel Impedance Value: 950 Ohm
Lead Channel Impedance Value: 969 Ohm
Lead Channel Pacing Threshold Amplitude: 0.5 V
Lead Channel Pacing Threshold Amplitude: 0.625 V
Lead Channel Pacing Threshold Amplitude: 1.25 V
Lead Channel Pacing Threshold Pulse Width: 0.4 ms
Lead Channel Pacing Threshold Pulse Width: 0.4 ms
Lead Channel Pacing Threshold Pulse Width: 0.8 ms
Lead Channel Sensing Intrinsic Amplitude: 15.375 mV
Lead Channel Sensing Intrinsic Amplitude: 15.375 mV
Lead Channel Sensing Intrinsic Amplitude: 2 mV
Lead Channel Sensing Intrinsic Amplitude: 2 mV
Lead Channel Setting Pacing Amplitude: 1.5 V
Lead Channel Setting Pacing Amplitude: 1.75 V
Lead Channel Setting Pacing Amplitude: 2 V
Lead Channel Setting Pacing Pulse Width: 0.4 ms
Lead Channel Setting Pacing Pulse Width: 0.8 ms
Lead Channel Setting Sensing Sensitivity: 0.3 mV
Zone Setting Status: 755011
Zone Setting Status: 755011

## 2023-05-02 DIAGNOSIS — I5042 Chronic combined systolic (congestive) and diastolic (congestive) heart failure: Secondary | ICD-10-CM | POA: Diagnosis not present

## 2023-05-02 DIAGNOSIS — N2581 Secondary hyperparathyroidism of renal origin: Secondary | ICD-10-CM | POA: Diagnosis not present

## 2023-05-02 DIAGNOSIS — D638 Anemia in other chronic diseases classified elsewhere: Secondary | ICD-10-CM | POA: Diagnosis not present

## 2023-05-02 DIAGNOSIS — N184 Chronic kidney disease, stage 4 (severe): Secondary | ICD-10-CM | POA: Diagnosis not present

## 2023-05-02 DIAGNOSIS — N189 Chronic kidney disease, unspecified: Secondary | ICD-10-CM | POA: Diagnosis not present

## 2023-05-02 DIAGNOSIS — D696 Thrombocytopenia, unspecified: Secondary | ICD-10-CM | POA: Diagnosis not present

## 2023-05-06 ENCOUNTER — Ambulatory Visit: Payer: Medicare PPO | Attending: Cardiology

## 2023-05-06 DIAGNOSIS — Z95810 Presence of automatic (implantable) cardiac defibrillator: Secondary | ICD-10-CM

## 2023-05-06 DIAGNOSIS — I5022 Chronic systolic (congestive) heart failure: Secondary | ICD-10-CM | POA: Diagnosis not present

## 2023-05-08 DIAGNOSIS — D638 Anemia in other chronic diseases classified elsewhere: Secondary | ICD-10-CM | POA: Diagnosis not present

## 2023-05-08 DIAGNOSIS — I5042 Chronic combined systolic (congestive) and diastolic (congestive) heart failure: Secondary | ICD-10-CM | POA: Diagnosis not present

## 2023-05-08 DIAGNOSIS — N2581 Secondary hyperparathyroidism of renal origin: Secondary | ICD-10-CM | POA: Diagnosis not present

## 2023-05-08 DIAGNOSIS — N184 Chronic kidney disease, stage 4 (severe): Secondary | ICD-10-CM | POA: Diagnosis not present

## 2023-05-08 NOTE — Progress Notes (Signed)
EPIC Encounter for ICM Monitoring  Patient Name: Chad Brown is a 82 y.o. male Date: 05/08/2023 Primary Care Physican: Cleatis Polka., MD Primary Cardiologist: Nishan/McLean Electrophysiologist: Kathreen Cornfield Pacing: 98.7%   12/19/2022 Weight: 210-211 lbs 01/23/2023 Weight: 211.6 lbs 05/08/2023 Weight: 208 lbs   Clinical Status Since 29-Apr-2023 Time in AT/AF 0.0 hr/day (0.0%))   Battery Longevity: 1 month      Spoke with patient and heart failure questions reviewed.  Transmission results reviewed.  Pt asymptomatic for fluid accumulation.  Reports feeling well at this time and voices no complaints.     Optivol thoracic impedance suggesting normal fluid levels.     Prescribed:  Furosemide 40 mg 1 tablet (40 mg total) by mouth in the morning and 0.5 tablet (20 mg total)  every evening  20 mg  Spironolactone 25 mg Take 1 tablet (25 mg total) by mouth daily. Take at bedtime   Labs: 03/15/2022 Creatine 2.01, BUN 26, Potassium 4.5, Sodium 139, GFR 33 A complete set of results can be found in Results Review.   Recommendations:  No changes and encouraged to call if experiencing any fluid symptoms.   Follow-up plan: ICM clinic phone appointment on 06/10/2023.  91 day device clinic remote transmission 05/30/2023.     EP/Cardiology Next Office Visit:    Recall 05/28/2023 with Dr Shirlee Latch.     Copy of ICM check sent to Dr. Elberta Fortis.    3 month ICM trend: 05/06/2023.    12-14 Month ICM trend:     Karie Soda, RN 05/08/2023 4:27 PM

## 2023-05-09 ENCOUNTER — Other Ambulatory Visit (HOSPITAL_COMMUNITY)
Admission: RE | Admit: 2023-05-09 | Discharge: 2023-05-09 | Disposition: A | Discharge status: Home / Self Care | Payer: Medicare PPO | Source: Ambulatory Visit | Attending: "Endocrinology | Admitting: "Endocrinology

## 2023-05-09 DIAGNOSIS — C73 Malignant neoplasm of thyroid gland: Secondary | ICD-10-CM | POA: Diagnosis not present

## 2023-05-09 LAB — TSH: TSH: 1.998 u[IU]/mL (ref 0.350–4.500)

## 2023-05-09 LAB — T4, FREE: Free T4: 0.91 ng/dL (ref 0.61–1.12)

## 2023-05-10 ENCOUNTER — Ambulatory Visit: Payer: Medicare PPO | Admitting: "Endocrinology

## 2023-05-10 ENCOUNTER — Encounter: Payer: Self-pay | Admitting: "Endocrinology

## 2023-05-10 VITALS — BP 106/68 | HR 72 | Ht 66.0 in | Wt 213.8 lb

## 2023-05-10 DIAGNOSIS — C73 Malignant neoplasm of thyroid gland: Secondary | ICD-10-CM | POA: Diagnosis not present

## 2023-05-10 DIAGNOSIS — E890 Postprocedural hypothyroidism: Secondary | ICD-10-CM | POA: Diagnosis not present

## 2023-05-10 NOTE — Progress Notes (Signed)
05/10/2023, 11:17 AM   Endocrinology follow-up note  Subjective:    Patient ID: Chad Brown, male    DOB: 04-14-1941, PCP Cleatis Polka., MD   Past Medical History:  Diagnosis Date   Adenomatous colon polyp    tubular   AICD (automatic cardioverter/defibrillator) present 03/08/2015   MDT CRTD dual pacemaker and defib   Anemia    iron deficient   Arthritis    "about all my joints; hands, knees, back" (03/08/2015)   Atherosclerosis    Cataract    left eye small   Cholelithiasis    gallstones   Chronic systolic CHF (congestive heart failure) (HCC)    a. New dx 12/2012 ? NICM, may be r/t afib. b. Nuc 03/2013 - normal;  c. 03/2015 TEE EF 15-20%.   Colon polyp, hyperplastic 01/2015   removed precancerous lesions   Depression    Diverticulosis    Dysrhythmia    afib   GERD (gastroesophageal reflux disease)    Glaucoma    right eye   Hyperlipidemia    Hypertension    Melanoma of eye (HCC) 2000's   "right; it's never been biopsied"   Melanoma of lower leg (HCC) 2015   "left; right at my knee"   Myocardial infarction (HCC) 1998   OSA (obstructive sleep apnea) 01/04/2016   no longer tolerates cpap   Peripheral vision loss, right 2006   Persistent atrial fibrillation (HCC)    a. Dx 12/2012, s/p TEE/DCCV 01/26/13. b. On Xarelto (CHA2DS2VASc = 3);  c. 03/2015 TEE (EF 15-20%, no LAA thrombus) and DCCV - amio increased to 200 mg bid.   Pneumonia    Urinary hesitancy due to benign prostatic hypertrophy    Past Surgical History:  Procedure Laterality Date   ATRIAL FIBRILLATION ABLATION N/A 11/03/2019   Procedure: ATRIAL FIBRILLATION ABLATION;  Surgeon: Hillis Range, MD;  Location: [facility] INVASIVE CV LAB;  Service: Cardiovascular;  Laterality: N/A;   BACK SURGERY     upper back, cannot turn neck well   CARDIAC CATHETERIZATION  1998   CARDIOVERSION N/A 01/26/2013   Procedure: CARDIOVERSION;  Surgeon: Lewayne Bunting, MD;  Location: [facility]  ENDOSCOPY;  Service: Cardiovascular;  Laterality: N/A;   CARDIOVERSION N/A 03/23/2015   Procedure: CARDIOVERSION;  Surgeon: Jake Bathe, MD;  Location: [facility] ENDOSCOPY;  Service: Cardiovascular;  Laterality: N/A;   CARDIOVERSION N/A 08/14/2017   Procedure: CARDIOVERSION;  Surgeon: Wendall Stade, MD;  Location: [facility] ENDOSCOPY;  Service: Cardiovascular;  Laterality: N/A;   CATARACT EXTRACTION Right ~ 2006   COLONOSCOPY WITH PROPOFOL N/A 02/10/2015   Procedure: COLONOSCOPY WITH PROPOFOL;  Surgeon: Iva Boop, MD;  Location: [facility] ENDOSCOPY;  Service: Endoscopy;  Laterality: N/A;   COLONOSCOPY WITH PROPOFOL N/A 08/07/2016   Procedure: COLONOSCOPY WITH PROPOFOL;  Surgeon: Iva Boop, MD;  Location: [facility] ENDOSCOPY;  Service: Endoscopy;  Laterality: N/A;   ENTEROSCOPY N/A 08/17/2015   Procedure: ENTEROSCOPY;  Surgeon: Iva Boop, MD;  Location: [facility] ENDOSCOPY;  Service: Endoscopy;  Laterality: N/A;   EP IMPLANTABLE DEVICE N/A 03/08/2015   MDT Ovidio Kin CRT-D for nonischemic CM by Dr Johney Frame for primary prevention   GLAUCOMA SURGERY Right ~ 2006   "put 3 stents in to drain  fluid" (03/08/2015) not successful, sent to [facility] to try to get last stent out   HOT HEMOSTASIS N/A 08/07/2016   Procedure: HOT HEMOSTASIS (ARGON PLASMA COAGULATION/BICAP);  Surgeon: Iva Boop, MD;  Location: [facility] ENDOSCOPY;  Service: Endoscopy;  Laterality: N/A;   INCISION AND DRAINAGE ABSCESS POSTERIOR CERVICALSPINE  05/2012   JOINT REPLACEMENT     MELANOMA EXCISION Left 2015   "lower leg; right at my knee"   REFRACTIVE SURGERY Right ~ 2006 X 2   "twice; both done at [facility]" (03/08/2015   SURGERY SCROTAL / TESTICULAR Right 1990's   TEE WITHOUT CARDIOVERSION N/A 01/26/2013   Procedure: TRANSESOPHAGEAL ECHOCARDIOGRAM (TEE);  Surgeon: Lewayne Bunting, MD;  Location: [facility] ENDOSCOPY;  Service: Cardiovascular;  Laterality: N/A;  [name] anes. /    TEE WITHOUT CARDIOVERSION N/A 10/05/2014   Procedure: TRANSESOPHAGEAL ECHOCARDIOGRAM (TEE)  with  cardioversion;  Surgeon: Vesta Mixer, MD;  Location: [facility] ENDOSCOPY;  Service: Cardiovascular;  Laterality: N/A;  12:52 synched cardioversion at 120 joules,...afib to SR...12 lead EKG ordered.Marland KitchenMarland KitchenCardiozem d/c'ed per MD verbal order at SR   TEE WITHOUT CARDIOVERSION N/A 03/23/2015   Procedure: TRANSESOPHAGEAL ECHOCARDIOGRAM (TEE);  Surgeon: Jake Bathe, MD;  Location: [facility] ENDOSCOPY;  Service: Cardiovascular;  Laterality: N/A;   TEE WITHOUT CARDIOVERSION N/A 11/02/2019   Procedure: TRANSESOPHAGEAL ECHOCARDIOGRAM (TEE);  Surgeon: Wendall Stade, MD;  Location: [facility] ENDOSCOPY;  Service: Cardiovascular;  Laterality: N/A;   THORACIC SPINE SURGERY  03/2000   "ground calcium deposits from upper thoracic" (01/26/2013)   THYROIDECTOMY N/A 05/11/2021   Procedure: TOTAL THYROIDECTOMY;  Surgeon: Darnell Level, MD;  Location: [facility] ORS;  Service: General;  Laterality: N/A;   TOTAL HIP ARTHROPLASTY Right 06/2007   Social History   Socioeconomic History   Marital status: Married    Spouse name: Not on file   Number of children: 3   Years of education: Not on file   Highest education level: Not on file  Occupational History   Occupation: retired  Tobacco Use   Smoking status: Former    Current packs/day: 0.00    Average packs/day: 3.0 packs/day for 48.0 years (144.0 ttl pk-yrs)    Types: Cigarettes    Start date: 09/28/1952    Quit date: 09/28/2000    Years since quitting: 22.6   Smokeless tobacco: Never  Vaping Use   Vaping status: Never Used  Substance and Sexual Activity   Alcohol use: No   Drug use: No   Sexual activity: Not Currently  Other Topics Concern   Not on file  Social History Narrative   Pt lives in [place] with spouse.  3 children are grown and healthy.   Retired.  Ran a country store for 30 years, previously worked in the [employer] for 16 years   Social Determinants of Corporate investment banker Strain: Not on [employer] Insecurity: Not on file  Transportation Needs:  No Transportation Needs (11/03/2020)   PRAPARE - Administrator, Civil Service (Medical): No    Lack of Transportation (Non-Medical): No  Physical Activity: Inactive (11/03/2020)   Exercise Vital Sign    Days of Exercise per Week: 0 days    Minutes of Exercise per Session: 0 min  Stress: Not on file  Social Connections: Not on file   Family History  Problem Relation Age of Onset   Kidney disease Mother    Heart disease Mother        MI, open heart   Diabetes  Mother        dialysis   Leukemia Father    Colon cancer Paternal Uncle    Lung cancer Paternal Uncle        x 2   Prostate cancer Paternal Uncle    Diabetes Maternal Grandmother    Heart attack Maternal Uncle    Diabetes Maternal Aunt        x 3   Diabetes Maternal Uncle    Thyroid disease Sister    Outpatient Encounter Medications as of 05/10/2023  Medication Sig   amiodarone (PACERONE) 200 MG tablet Take 1 tablet (200 mg total) by mouth daily.   calcium carbonate (TUMS - DOSED IN MG ELEMENTAL CALCIUM) 500 MG chewable tablet Chew 2 tablets by mouth 2 (two) times daily.   carvedilol (COREG) 3.125 MG tablet Take 1 tablet (3.125 mg total) by mouth 2 (two) times daily.   cholecalciferol (VITAMIN D3) 25 MCG (1000 UNIT) tablet Take 1,000 Units by mouth daily.   Cyanocobalamin (VITAMIN B 12 PO) Take 1 tablet by mouth daily.   dorzolamide-timolol (COSOPT) 22.3-6.8 MG/ML ophthalmic solution Place 1 drop into the right eye 2 (two) times daily.   empagliflozin (JARDIANCE) 10 MG TABS tablet Take 1 tablet (10 mg total) by mouth daily.   escitalopram (LEXAPRO) 20 MG tablet Take 1 tablet (20 mg total) by mouth daily.   Evolocumab (REPATHA SURECLICK) 140 MG/ML SOAJ INJECT 140MG  ( ) INTO SKIN EVERY 2 WEEKS   ferrous sulfate 325 (65 FE) MG tablet Take 325 mg by mouth daily with breakfast.   finasteride (PROSCAR) 5 MG tablet Take 1 tablet (5 mg total) by mouth daily.   FOLIC ACID PO Take 1 tablet by mouth daily.   furosemide  (LASIX) 20 MG tablet Take 1 tablet (20 mg total) by mouth 2 (two) times daily.   gabapentin (NEURONTIN) 100 MG capsule Take 200 mg by mouth 2 (two) times daily.    latanoprost (XALATAN) 0.005 % ophthalmic solution Place 1 drop into the right eye at bedtime.   levothyroxine (SYNTHROID) 137 MCG tablet Take 1 tablet (137 mcg total) by mouth daily before breakfast.   nitroGLYCERIN (NITROSTAT) 0.4 MG SL tablet Place 0.4 mg under the tongue every 5 (five) minutes as needed for chest pain.   Polyethyl Glycol-Propyl Glycol 0.4-0.3 % SOLN Place 1 drop into the left eye in the morning and at bedtime.   RHOPRESSA 0.02 % SOLN Place 1 drop into the right eye at bedtime.   spironolactone (ALDACTONE) 25 MG tablet TAKE 1 TABLET DAILY AT BEDTIME   tamsulosin (FLOMAX) 0.4 MG CAPS Take 0.4 mg by mouth at bedtime.    XARELTO 15 MG TABS tablet TAKE (1) TABLET DAILY WITH SUPPER.   No facility-administered encounter medications on file as of 05/10/2023.   ALLERGIES: Allergies  Allergen Reactions   Pravastatin Sodium Other (See Comments)    Joint and muscle pain   Azithromycin Diarrhea    VACCINATION STATUS: Immunization History  Administered Date(s) Administered   Fluad Quad(high Dose 65+) 06/10/2019   Influenza Inj Mdck Quad With Preservative 06/27/2018   Influenza, High Dose Seasonal PF 06/29/2016, 07/04/2017, 06/29/2020   Influenza, Seasonal, Injecte, Preservative Fre 08/02/2015   Influenza-Unspecified 07/11/2014, 07/18/2015   Moderna Sars-Covid-2 Vaccination 12/08/2019, 12/28/2019, 07/27/2020   Pneumococcal Conjugate-13 08/23/2014   Pneumococcal Polysaccharide-23 10/01/2010, 03/04/2018   Pneumococcal-Unspecified 06/02/2011   Td 10/01/2008   Td (Adult), 2 Lf Tetanus Toxid, Preservative Free 10/01/2008   Tdap 12/16/2014   Zoster Recombinant(Shingrix) 12/24/2017, 12/27/2017, 03/04/2018  Zoster, Live 10/01/2010, 08/01/2013, 08/20/2013    HPI Chad Brown is 82 y.o. male who presents today with a  medical history as above. he is being seen in follow-up after he was seen in consultation for postsurgical hypothyroidism and history of thyroid malignancy requested by Cleatis Polka., MD.  Patient is accompanied by his wife to clinic. History is obtained from the patient as well as chart review. Accordingly, patient is status post total thyroidectomy on May 11, 2021 after workup showed papillary thyroid malignancy.  This was subsequently followed by Thyrogen stimulated thyroid remnant ablation with negative whole body scan completed on October 27, 2021.He has recovered from his surgery completely.   Patient has been on regular dose of levothyroxine ever since.   He is on levothyroxine 137 mcg p.o. daily before breakfast with good consistency and compliance. He has no new complaints today.   His previsit thyroid function tests are consistent with appropriate replacement-see below. He denies family history of thyroid dysfunction or thyroid malignancy.  He denies any prior history of thyroid radiation exposure.  He denies dysphagia, shortness of breath, nor voice change.  He denies palpitations, tremors, nor heat/cold intolerance.  His other medical problems include hypertension, hyperlipidemia, coronary artery disease, CHF, on medications management.  His most recent hemoglobin A1c is 5.7% on November 01, 2022.    Review of Systems  Constitutional: + Minimally fluctuating body weight, no fatigue, no subjective hyperthermia, no subjective hypothermia Eyes: no blurry vision, no xerophthalmia ENT: no sore throat, no nodules palpated in throat, no dysphagia/odynophagia, no hoarseness Cardiovascular: no Chest Pain, no Shortness of Breath, no palpitations, no leg swelling Respiratory: no cough, no shortness of breath Gastrointestinal: no Nausea/Vomiting/Diarhhea Musculoskeletal: no muscle/joint aches Skin: no rashes Neurological: no tremors, no numbness, no tingling, no dizziness Psychiatric:  no depression, no anxiety  Objective:       05/10/2023   10:01 AM 04/10/2023    8:19 AM 03/07/2023    9:15 AM  Vitals with BMI  Height 5\' 6"   5\' 6"   Weight 213 lbs 13 oz 215 lbs 6 oz 217 lbs  BMI 34.52  35.04  Systolic 106 111 409  Diastolic 68 72 68  Pulse 72 74 65    BP 106/68   Pulse 72   Ht 5\' 6"  (1.676 m)   Wt 213 lb 12.8 oz (97 kg)   BMI 34.51 kg/m   Wt Readings from Last 3 Encounters:  05/10/23 213 lb 12.8 oz (97 kg)  04/10/23 215 lb 6.2 oz (97.7 kg)  03/07/23 217 lb (98.4 kg)    Physical Exam  Constitutional:  Body mass index is 34.51 kg/m.,  not in acute distress, normal state of mind Eyes: PERRLA, EOMI, no exophthalmos ENT: moist mucous membranes, + healed post thyroidectomy scar on anterior lower neck,  no gross cervical lymphadenopathy Cardiovascular: normal precordial activity, Regular Rate and Rhythm, no Murmur/Rubs/Gallops Respiratory:  adequate breathing efforts, no gross chest deformity, Clear to auscultation bilaterally Gastrointestinal: abdomen soft, Non -tender, No distension, Bowel Sounds present, no gross organomegaly Musculoskeletal: no gross deformities, strength intact in all four extremities Skin: moist, warm, no rashes Neurological: no tremor with outstretched hands, Deep tendon reflexes normal in bilateral lower extremities.  CMP ( most recent) CMP     Component Value Date/Time   NA 136 04/03/2023 0925   NA 144 03/30/2020 0911   K 4.6 04/03/2023 0925   CL 101 04/03/2023 0925   CO2 27 04/03/2023 0925   GLUCOSE  149 (H) 04/03/2023 0925   BUN 35 (H) 04/03/2023 0925   BUN 19 03/30/2020 0911   CREATININE 2.46 (H) 04/03/2023 0925   CALCIUM 9.1 04/03/2023 0925   PROT 7.4 04/03/2023 0925   ALBUMIN 3.6 04/03/2023 0925   AST 12 (L) 04/03/2023 0925   ALT 19 04/03/2023 0925   ALKPHOS 65 04/03/2023 0925   BILITOT 0.6 04/03/2023 0925   GFRNONAA 26 (L) 04/03/2023 0925   GFRAA 38 (L) 04/29/2020 1034     Diabetic Labs (most recent): Lab Results   Component Value Date   HGBA1C 5.7 11/02/2022   HGBA1C 5.9 (H) 05/01/2021   HGBA1C 6.5 (H) 10/02/2014     Lipid Panel ( most recent) Lipid Panel     Component Value Date/Time   CHOL 116 11/02/2022 0000   TRIG 202 (A) 11/02/2022 0000   HDL 28 (A) 11/02/2022 0000   CHOLHDL 4.2 10/18/2022 0949   VLDL 35 10/18/2022 0949   LDLCALC 48 11/02/2022 0000      Lab Results  Component Value Date   TSH 1.998 05/09/2023   TSH 3.15 11/01/2022   TSH 9.19 (H) 08/02/2021   TSH 3.441 10/27/2019   TSH 3.135 10/22/2018   TSH 3.620 07/22/2018   TSH 1.746 08/21/2017   TSH 1.637 08/14/2016   TSH 1.124 06/07/2015   TSH 2.01 10/29/2014   FREET4 0.91 05/09/2023   FREET4 0.64 08/02/2021   FREET4 0.93 10/29/2014     Assessment & Plan:   1. Papillary thyroid carcinoma (HCC) 2. Postsurgical hypothyroidism    NELTON NIP  is being seen at a kind request of Clelia Croft Netta Corrigan., MD. - I have reviewed his new and available thyroid records and clinically evaluated the patient. - Based on these reviews, he has history of papillary thyroid carcinoma status post total thyroidectomy, Thyrogen stimulated thyroid remnant ablation with negative whole-body scan completed prior to January 2024. His recent thyroid function tests are consistent with appropriate replacement.  I advised him to continue levothyroxine 137 mcg p.o. daily before breakfast.     - We discussed about the correct intake of his thyroid hormone, on empty stomach at fasting, with water, separated by at least 30 minutes from breakfast and other medications,  and separated by more than 4 hours from calcium, iron, multivitamins, acid reflux medications (PPIs). -Patient is made aware of the fact that thyroid hormone replacement is needed for life, dose to be adjusted by periodic monitoring of thyroid function tests. He will continue to benefit from his calcium carbonate 1000 mg p.o. twice daily.  I had a long discussion with him about the  need for continued surveillance for cancer recurrence.  He will be considered for surveillance thyroid/neck ultrasound before his next visit in 6 months.    He is advised to continue regular follow-up with his PMD for his controlled diabetes, hypertension and hyperlipidemia.   - he is advised to maintain close follow up with Cleatis Polka., MD for primary care needs.   I spent  25  minutes in the care of the patient today including review of labs from Thyroid Function, CMP, and other relevant labs ; imaging/biopsy records (current and previous including abstractions from other facilities); face-to-face time discussing  his lab results and symptoms, medications doses, his options of short and long term treatment based on the latest standards of care / guidelines;   and documenting the encounter.  Chad Brown  participated in the discussions, expressed understanding, and voiced  agreement with the above plans.  All questions were answered to his satisfaction. he is encouraged to contact clinic should he have any questions or concerns prior to his return visit.  Follow up plan: Return in about 6 months (around 11/10/2023) for Thyroid / Neck Ultrasound, F/U with Pre-visit Labs.   Marquis Lunch, MD [facility] [address] [place], [place] [address] Phone: [phone]  Fax: [phone]     05/10/2023, 11:17 AM  This note was partially dictated with voice recognition software. Similar sounding words can be transcribed inadequately or may not  be corrected upon review.

## 2023-05-14 NOTE — Progress Notes (Signed)
Remote ICD transmission.   

## 2023-05-17 DIAGNOSIS — R040 Epistaxis: Secondary | ICD-10-CM | POA: Diagnosis not present

## 2023-05-17 DIAGNOSIS — Z950 Presence of cardiac pacemaker: Secondary | ICD-10-CM | POA: Diagnosis not present

## 2023-05-17 DIAGNOSIS — I10 Essential (primary) hypertension: Secondary | ICD-10-CM | POA: Diagnosis not present

## 2023-05-17 DIAGNOSIS — E039 Hypothyroidism, unspecified: Secondary | ICD-10-CM | POA: Diagnosis not present

## 2023-05-17 DIAGNOSIS — Z7901 Long term (current) use of anticoagulants: Secondary | ICD-10-CM | POA: Diagnosis not present

## 2023-05-17 DIAGNOSIS — I252 Old myocardial infarction: Secondary | ICD-10-CM | POA: Diagnosis not present

## 2023-05-20 ENCOUNTER — Telehealth (HOSPITAL_COMMUNITY): Payer: Self-pay | Admitting: Cardiology

## 2023-05-20 NOTE — Telephone Encounter (Signed)
Patient called with concerns of nose bleeds Went to ER 8/16- ER provider able to stop active bleeding- given nasal spray-xarelto held 8/16  Mild increase in bleeding Saturday and Sunday  Would like to know what to do next Should he hold xarelto   Please advise

## 2023-05-21 NOTE — Telephone Encounter (Signed)
Pt aware and voiced understanding 

## 2023-05-21 NOTE — Telephone Encounter (Signed)
If no further bleeding, start back on Xarelto tomorrow.

## 2023-05-23 ENCOUNTER — Ambulatory Visit (HOSPITAL_COMMUNITY): Payer: Medicare PPO | Admitting: Internal Medicine

## 2023-05-30 ENCOUNTER — Ambulatory Visit (INDEPENDENT_AMBULATORY_CARE_PROVIDER_SITE_OTHER): Payer: Medicare PPO

## 2023-05-30 DIAGNOSIS — I426 Alcoholic cardiomyopathy: Secondary | ICD-10-CM

## 2023-05-30 LAB — CUP PACEART REMOTE DEVICE CHECK
Battery Remaining Longevity: 1 mo
Battery Voltage: 2.73 V
Brady Statistic AP VP Percent: 99.45 %
Brady Statistic AP VS Percent: 0.06 %
Brady Statistic AS VP Percent: 0.48 %
Brady Statistic AS VS Percent: 0.01 %
Brady Statistic RA Percent Paced: 98.84 %
Brady Statistic RV Percent Paced: 99.43 %
Date Time Interrogation Session: 20240829002206
HighPow Impedance: 78 Ohm
Implantable Lead Connection Status: 753985
Implantable Lead Connection Status: 753985
Implantable Lead Connection Status: 753985
Implantable Lead Implant Date: 20160607
Implantable Lead Implant Date: 20160607
Implantable Lead Implant Date: 20160607
Implantable Lead Location: 753858
Implantable Lead Location: 753859
Implantable Lead Location: 753860
Implantable Lead Model: 4598
Implantable Lead Model: 5076
Implantable Pulse Generator Implant Date: 20160607
Lead Channel Impedance Value: 1140 Ohm
Lead Channel Impedance Value: 1311 Ohm
Lead Channel Impedance Value: 1425 Ohm
Lead Channel Impedance Value: 1539 Ohm
Lead Channel Impedance Value: 342 Ohm
Lead Channel Impedance Value: 399 Ohm
Lead Channel Impedance Value: 456 Ohm
Lead Channel Impedance Value: 513 Ohm
Lead Channel Impedance Value: 627 Ohm
Lead Channel Impedance Value: 779 Ohm
Lead Channel Impedance Value: 893 Ohm
Lead Channel Impedance Value: 893 Ohm
Lead Channel Impedance Value: 969 Ohm
Lead Channel Pacing Threshold Amplitude: 0.5 V
Lead Channel Pacing Threshold Amplitude: 0.625 V
Lead Channel Pacing Threshold Amplitude: 1.375 V
Lead Channel Pacing Threshold Pulse Width: 0.4 ms
Lead Channel Pacing Threshold Pulse Width: 0.4 ms
Lead Channel Pacing Threshold Pulse Width: 0.8 ms
Lead Channel Sensing Intrinsic Amplitude: 1.75 mV
Lead Channel Sensing Intrinsic Amplitude: 1.75 mV
Lead Channel Sensing Intrinsic Amplitude: 14.625 mV
Lead Channel Sensing Intrinsic Amplitude: 14.625 mV
Lead Channel Setting Pacing Amplitude: 1.5 V
Lead Channel Setting Pacing Amplitude: 2 V
Lead Channel Setting Pacing Amplitude: 2 V
Lead Channel Setting Pacing Pulse Width: 0.4 ms
Lead Channel Setting Pacing Pulse Width: 0.8 ms
Lead Channel Setting Sensing Sensitivity: 0.3 mV
Zone Setting Status: 755011
Zone Setting Status: 755011

## 2023-06-04 ENCOUNTER — Telehealth: Payer: Self-pay | Admitting: Cardiology

## 2023-06-04 ENCOUNTER — Ambulatory Visit: Payer: Medicare PPO | Attending: Cardiology

## 2023-06-04 DIAGNOSIS — I5022 Chronic systolic (congestive) heart failure: Secondary | ICD-10-CM

## 2023-06-04 DIAGNOSIS — Z95810 Presence of automatic (implantable) cardiac defibrillator: Secondary | ICD-10-CM

## 2023-06-04 MED ORDER — POTASSIUM CHLORIDE CRYS ER 20 MEQ PO TBCR: 20.0000 meq | EXTENDED_RELEASE_TABLET | Freq: Every day | ORAL | 3 refills | Status: DC

## 2023-06-04 MED ORDER — FUROSEMIDE 20 MG PO TABS: 40.0000 mg | ORAL_TABLET | Freq: Two times a day (BID) | ORAL | 2 refills | Status: DC

## 2023-06-04 NOTE — Telephone Encounter (Signed)
Patient stated he received an alert that is pacemaker battery needs to be changed.  Patient wants a call back on next steps.

## 2023-06-04 NOTE — Progress Notes (Signed)
  Received: Today [place], Anderson Malta, FNP  Cina Klumpp, Josephine Igo, RN Please increase Lasix to 40 mg bid, add 20 KCL daily. He will need a BMET in 7-10 days please

## 2023-06-04 NOTE — Telephone Encounter (Signed)
Spoke with patient informed him that his ICD had reached ERI as of 06/04/23, informed patient that we had  3 months to get device changed out, and would send msg to scheduler to call and get OV to discuss generator change out. Also informed patient that the alarm would go off everyday at 0800 and that he would need to come into the office to get it changed out. Patient stated that for now it would be ok.   Patient also mentioned that he had had been SOB recently, patient also reported 2-4 pound weight gain variance, patient CRT does indicated possible fluid accumulation since 05/21/23   Informed patient I would forward this information to HF team, patient reports compliance with Lasix 20mg  BID

## 2023-06-04 NOTE — Progress Notes (Signed)
EPIC Encounter for ICM Monitoring  Patient Name: Chad Brown is a 82 y.o. male Date: 06/04/2023 Primary Care Physican: Cleatis Polka., MD Primary Cardiologist: Nishan/McLean Electrophysiologist: Kathreen Cornfield Pacing: 99.7%   12/19/2022 Weight: 210-211 lbs 01/23/2023 Weight: 211.6 lbs 05/08/2023 Weight: 208 lbs 06/03/2023 Weight: 213 lbs 06/04/2023 Weight: 212 lbs (baseline 208-210 lbs)    Clinical Status Since 29-Apr-2023 Time in AT/AF 0.0 hr/day (0.0%))   Battery Longevity:  ERI reached 9/3     Spoke with patient and heart failure questions reviewed.  Transmission results reviewed.  Pt reports SOB and weight gain of 2-3 lbs for at least 1 week to 10 days.   He is unsure what has caused the fluid accumulation and reports diet and fluid intake are unchanged.    Optivol thoracic impedance suggesting possible fluid accumulation since 8/17.     Prescribed:  Furosemide 20 mg 1 tablet (20 mg total) by mouth twice a day Spironolactone 25 mg Take 1 tablet (25 mg total) by mouth daily. Take at bedtime   Labs: 04/03/2023 Creatinine 2.46, BUN 35, Potassium 4.6, Sodium 136, GFR 26  10/26/2022 Creatinine 2.00, BUN 28, Potassium 4.3, Sodium 139, GFR 33  10/18/2022 Creatinine 2.10, BUN 34, Potassium 4.0, Sodium 140, GFR 31  10/11/2022 Creatinine 2.30, BUN 36, Potassium 4.0, Sodium 138, GFR 28 10/10/2022 Creatinine 2.50, BUN 36, Potassium 4.1, Sodium 137  A complete set of results can be found in Results Review.   Recommendations:  Copy sent to Prince Rome, NP at [facility] clinic for review and recommendations.  Confirmed he is compliant with Lasix 20 mg bid.     Follow-up plan: ICM clinic phone appointment on 06/10/2023 for 31 day check and fluid level recheck.  91 day device clinic remote transmission 07/01/2023.     EP/Cardiology Next Office Visit:    07/17/2023 with Dr Shirlee Latch.     Copy of ICM check sent to Dr. Elberta Fortis.     3 month ICM trend: 06/04/2023.    12-14 Month ICM trend:      Karie Soda, RN 06/04/2023 12:34 PM

## 2023-06-04 NOTE — Telephone Encounter (Signed)
Spoke with patient.  See ICM note for follow up.  Copy of ICM note sent to HF clinic for recommendations.

## 2023-06-04 NOTE — Progress Notes (Signed)
Spoke with patient and advised Chad Rome, NP provided the following recommendations:  Increase Lasix 20 mg to 2 tablets (40 mg total) twice a day Add Potassium 20 mEq 1 tablet once a day BMET Lab to be drawn in 7-10 days.     He has enough Lasix to make dosage change and will call when refill is needed.  He will pick up new prescription of Potassium at preferred pharmacy.  He will have BMET drawn at [facility] 9/11.    Advised to call back if fluid symptoms persist or worsen or if he feels dizzy or lightheaded after increasing Lasix dosage.  He verbalized understanding and agreed with plan.

## 2023-06-05 NOTE — Progress Notes (Signed)
Remote ICD transmission.   

## 2023-06-10 ENCOUNTER — Ambulatory Visit: Payer: Medicare PPO | Attending: Cardiology | Admitting: Cardiology

## 2023-06-10 ENCOUNTER — Encounter: Payer: Self-pay | Admitting: Cardiology

## 2023-06-10 DIAGNOSIS — D6869 Other thrombophilia: Secondary | ICD-10-CM

## 2023-06-10 DIAGNOSIS — I5022 Chronic systolic (congestive) heart failure: Secondary | ICD-10-CM | POA: Diagnosis not present

## 2023-06-10 DIAGNOSIS — I4819 Other persistent atrial fibrillation: Secondary | ICD-10-CM

## 2023-06-10 DIAGNOSIS — G4733 Obstructive sleep apnea (adult) (pediatric): Secondary | ICD-10-CM

## 2023-06-10 NOTE — Progress Notes (Signed)
Virtual Visit via Video Note   Because of Chad Brown's co-morbid illnesses, he is at least at moderate risk for complications without adequate follow up.  This format is felt to be most appropriate for this patient at this time.  All issues noted in this document were discussed and addressed.  A limited physical exam was performed with this format.  Please refer to the patient's chart for his consent to telehealth for [facility].       Date:  06/10/2023   ID:  Chad Brown, DOB 1941-01-07, MRN [id number] The patient was identified using 2 identifiers.  Patient Location: Home Provider Location: Office/Clinic   PCP:  Cleatis Polka., MD    HeartCare Providers Cardiologist:  Charlton Haws, MD Sleep Medicine:  Armanda Magic, MD     Evaluation Performed:  Follow-Up Visit  Chief Complaint:  ICD ERI  History of Present Illness:    Chad Brown is a 82 y.o. male with chronic systolic heart failure.  He is status post Medtronic ICD.  His device is reached ERI.  Has plans for ICD generator change.  The patient tells me today that he has been more short of breath.  His Lasix dose was increased recently, and he is lost 8 pounds.  Despite this, he continues to have episodes of shortness of breath, making him feel quite poorly.   Past Medical History:  Diagnosis Date   Adenomatous colon polyp    tubular   AICD (automatic cardioverter/defibrillator) present 03/08/2015   MDT CRTD dual pacemaker and defib   Anemia    iron deficient   Arthritis    "about all my joints; hands, knees, back" (03/08/2015)   Atherosclerosis    Cataract    left eye small   Cholelithiasis    gallstones   Chronic systolic CHF (congestive heart failure) (HCC)    a. New dx 12/2012 ? NICM, may be r/t afib. b. Nuc 03/2013 - normal;  c. 03/2015 TEE EF 15-20%.   Colon polyp, hyperplastic 01/2015   removed precancerous lesions   Depression    Diverticulosis    Dysrhythmia    afib    GERD (gastroesophageal reflux disease)    Glaucoma    right eye   Hyperlipidemia    Hypertension    Melanoma of eye (HCC) 2000's   "right; it's never been biopsied"   Melanoma of lower leg (HCC) 2015   "left; right at my knee"   Myocardial infarction (HCC) 1998   OSA (obstructive sleep apnea) 01/04/2016   no longer tolerates cpap   Peripheral vision loss, right 2006   Persistent atrial fibrillation (HCC)    a. Dx 12/2012, s/p TEE/DCCV 01/26/13. b. On Xarelto (CHA2DS2VASc = 3);  c. 03/2015 TEE (EF 15-20%, no LAA thrombus) and DCCV - amio increased to 200 mg bid.   Pneumonia    Urinary hesitancy due to benign prostatic hypertrophy    Past Surgical History:  Procedure Laterality Date   ATRIAL FIBRILLATION ABLATION N/A 11/03/2019   Procedure: ATRIAL FIBRILLATION ABLATION;  Surgeon: Hillis Range, MD;  Location: [facility] INVASIVE CV LAB;  Service: Cardiovascular;  Laterality: N/A;   BACK SURGERY     upper back, cannot turn neck well   CARDIAC CATHETERIZATION  1998   CARDIOVERSION N/A 01/26/2013   Procedure: CARDIOVERSION;  Surgeon: Lewayne Bunting, MD;  Location: [facility] ENDOSCOPY;  Service: Cardiovascular;  Laterality: N/A;   CARDIOVERSION N/A 03/23/2015   Procedure: CARDIOVERSION;  Surgeon:  Jake Bathe, MD;  Location: [facility] ENDOSCOPY;  Service: Cardiovascular;  Laterality: N/A;   CARDIOVERSION N/A 08/14/2017   Procedure: CARDIOVERSION;  Surgeon: Wendall Stade, MD;  Location: [facility] ENDOSCOPY;  Service: Cardiovascular;  Laterality: N/A;   CATARACT EXTRACTION Right ~ 2006   COLONOSCOPY WITH PROPOFOL N/A 02/10/2015   Procedure: COLONOSCOPY WITH PROPOFOL;  Surgeon: Iva Boop, MD;  Location: [facility] ENDOSCOPY;  Service: Endoscopy;  Laterality: N/A;   COLONOSCOPY WITH PROPOFOL N/A 08/07/2016   Procedure: COLONOSCOPY WITH PROPOFOL;  Surgeon: Iva Boop, MD;  Location: [facility] ENDOSCOPY;  Service: Endoscopy;  Laterality: N/A;   ENTEROSCOPY N/A 08/17/2015   Procedure: ENTEROSCOPY;  Surgeon: Iva Boop, MD;   Location: [facility] ENDOSCOPY;  Service: Endoscopy;  Laterality: N/A;   EP IMPLANTABLE DEVICE N/A 03/08/2015   MDT Ovidio Kin CRT-D for nonischemic CM by Dr Johney Frame for primary prevention   GLAUCOMA SURGERY Right ~ 2006   "put 3 stents in to drain fluid" (03/08/2015) not successful, sent to [facility] to try to get last stent out   HOT HEMOSTASIS N/A 08/07/2016   Procedure: HOT HEMOSTASIS (ARGON PLASMA COAGULATION/BICAP);  Surgeon: Iva Boop, MD;  Location: [facility] ENDOSCOPY;  Service: Endoscopy;  Laterality: N/A;   INCISION AND DRAINAGE ABSCESS POSTERIOR CERVICALSPINE  05/2012   JOINT REPLACEMENT     MELANOMA EXCISION Left 2015   "lower leg; right at my knee"   REFRACTIVE SURGERY Right ~ 2006 X 2   "twice; both done at [facility]" (03/08/2015   SURGERY SCROTAL / TESTICULAR Right 1990's   TEE WITHOUT CARDIOVERSION N/A 01/26/2013   Procedure: TRANSESOPHAGEAL ECHOCARDIOGRAM (TEE);  Surgeon: Lewayne Bunting, MD;  Location: [facility] ENDOSCOPY;  Service: Cardiovascular;  Laterality: N/A;  [name] anes. /    TEE WITHOUT CARDIOVERSION N/A 10/05/2014   Procedure: TRANSESOPHAGEAL ECHOCARDIOGRAM (TEE)  with cardioversion;  Surgeon: Vesta Mixer, MD;  Location: [facility] ENDOSCOPY;  Service: Cardiovascular;  Laterality: N/A;  12:52 synched cardioversion at 120 joules,...afib to SR...12 lead EKG ordered.Marland KitchenMarland KitchenCardiozem d/c'ed per MD verbal order at SR   TEE WITHOUT CARDIOVERSION N/A 03/23/2015   Procedure: TRANSESOPHAGEAL ECHOCARDIOGRAM (TEE);  Surgeon: Jake Bathe, MD;  Location: [facility] ENDOSCOPY;  Service: Cardiovascular;  Laterality: N/A;   TEE WITHOUT CARDIOVERSION N/A 11/02/2019   Procedure: TRANSESOPHAGEAL ECHOCARDIOGRAM (TEE);  Surgeon: Wendall Stade, MD;  Location: [facility] ENDOSCOPY;  Service: Cardiovascular;  Laterality: N/A;   THORACIC SPINE SURGERY  03/2000   "ground calcium deposits from upper thoracic" (01/26/2013)   THYROIDECTOMY N/A 05/11/2021   Procedure: TOTAL THYROIDECTOMY;  Surgeon: Darnell Level, MD;  Location: [facility] ORS;  Service: General;   Laterality: N/A;   TOTAL HIP ARTHROPLASTY Right 06/2007     No outpatient medications have been marked as taking for the 06/10/23 encounter (Video Visit) with Regan Lemming, MD.     Allergies:   Pravastatin sodium and Azithromycin   Social History   Tobacco Use   Smoking status: Former    Current packs/day: 0.00    Average packs/day: 3.0 packs/day for 48.0 years (144.0 ttl pk-yrs)    Types: Cigarettes    Start date: 09/28/1952    Quit date: 09/28/2000    Years since quitting: 22.7   Smokeless tobacco: Never  Vaping Use   Vaping status: Never Used  Substance Use Topics   Alcohol use: No   Drug use: No     Family Hx: The patient's family history includes Colon cancer in his paternal uncle; Diabetes in his maternal aunt, maternal grandmother,  maternal uncle, and mother; Heart attack in his maternal uncle; Heart disease in his mother; Kidney disease in his mother; Leukemia in his father; Lung cancer in his paternal uncle; Prostate cancer in his paternal uncle; Thyroid disease in his sister.  ROS:   Please see the history of present illness.     All other systems reviewed and are negative.   Prior CV studies:   The following studies were reviewed today:    Labs/Other Tests and Data Reviewed:    EKG:  An ECG dated 03/07/23 was personally reviewed today and demonstrated:  AV paced  Recent Labs: 10/18/2022: B Natriuretic Peptide 266.3 04/03/2023: ALT 19; BUN 35; Creatinine, Ser 2.46; Hemoglobin 12.0; Platelets 167; Potassium 4.6; Sodium 136 05/09/2023: TSH 1.998   Recent Lipid Panel Lab Results  Component Value Date/Time   CHOL 116 11/02/2022 12:00 AM   TRIG 202 (A) 11/02/2022 12:00 AM   HDL 28 (A) 11/02/2022 12:00 AM   CHOLHDL 4.2 10/18/2022 09:49 AM   LDLCALC 48 11/02/2022 12:00 AM    Wt Readings from Last 3 Encounters:  05/10/23 213 lb 12.8 oz (97 kg)  04/10/23 215 lb 6.2 oz (97.7 kg)  03/07/23 217 lb (98.4 kg)     Risk Assessment/Calculations:     CHA2DS2-VASc Score = 4   This indicates a 4.8% annual risk of stroke. The patient's score is based upon: CHF History: 1 HTN History: 1 Diabetes History: 0 Stroke History: 0 Vascular Disease History: 0 Age Score: 2 Gender Score: 0         Objective:    Vital Signs:  There were no vitals taken for this visit.   VITAL SIGNS:  reviewed GEN:  no acute distress EYES:  sclerae anicteric, EOMI - Extraocular Movements Intact RESPIRATORY:  normal respiratory effort, symmetric expansion SKIN:  no rash, lesions or ulcers. MUSCULOSKELETAL:  no obvious deformities. NEURO:  alert and oriented x 3, no obvious focal deficit PSYCH:  normal affect  ASSESSMENT & PLAN:    Chronic systolic heart failure: Due to nonischemic cardiomyopathy.  Currently on optimal medical therapy per heart failure cardiology.  Post Medtronic CRT-D.  Device at [employer].  Rashan Rounsaville plan for generator change.  Risk and benefits have been discussed.  Include bleeding, infection, lead fracture.  He understands the risks and is agreed to the procedure.  The patient is more short of breath.  He has undergone diuresis per heart failure.  Zeyna Mkrtchyan discuss further with his heart failure physician who may need to move up his appointment. Persistent atrial fibrillation: Currently on Xarelto and amiodarone. Secondary hypercoagulable state: Currently on Xarelto for atrial fibrillation Obstructive sleep apnea: CPAP compliance encouraged        Time:   Today, I have spent 35 minutes with the patient with telehealth technology discussing the above problems.     Medication Adjustments/Labs and Tests Ordered: Current medicines are reviewed at length with the patient today.  Concerns regarding medicines are outlined above.   Tests Ordered: No orders of the defined types were placed in this encounter.   Medication Changes: No orders of the defined types were placed in this encounter.   Follow Up: 3 months post procedure  Signed, Farid Grigorian  Jorja Loa, MD  06/10/2023 4:38 PM    [facility]

## 2023-06-12 ENCOUNTER — Other Ambulatory Visit (HOSPITAL_COMMUNITY)
Admission: RE | Admit: 2023-06-12 | Discharge: 2023-06-12 | Disposition: A | Discharge status: Home / Self Care | Payer: Medicare PPO | Source: Ambulatory Visit | Attending: Family Medicine | Admitting: Family Medicine

## 2023-06-12 DIAGNOSIS — Z95810 Presence of automatic (implantable) cardiac defibrillator: Secondary | ICD-10-CM | POA: Insufficient documentation

## 2023-06-12 DIAGNOSIS — I5022 Chronic systolic (congestive) heart failure: Secondary | ICD-10-CM | POA: Insufficient documentation

## 2023-06-12 LAB — BASIC METABOLIC PANEL
Anion gap: 10 (ref 5–15)
BUN: 49 mg/dL — ABNORMAL HIGH (ref 8–23)
CO2: 27 mmol/L (ref 22–32)
Calcium: 9.1 mg/dL (ref 8.9–10.3)
Chloride: 97 mmol/L — ABNORMAL LOW (ref 98–111)
Creatinine, Ser: 3.31 mg/dL — ABNORMAL HIGH (ref 0.61–1.24)
GFR, Estimated: 18 mL/min — ABNORMAL LOW (ref >60)
Glucose, Bld: 118 mg/dL — ABNORMAL HIGH (ref 70–99)
Potassium: 5.6 mmol/L — ABNORMAL HIGH (ref 3.5–5.1)
Sodium: 134 mmol/L — ABNORMAL LOW (ref 135–145)

## 2023-06-14 ENCOUNTER — Telehealth (HOSPITAL_COMMUNITY): Payer: Self-pay

## 2023-06-14 DIAGNOSIS — I5022 Chronic systolic (congestive) heart failure: Secondary | ICD-10-CM

## 2023-06-14 NOTE — Telephone Encounter (Signed)
-----   Message from Jacklynn Ganong sent at 06/12/2023  1:37 PM EDT ----- K and creatinine are elevated.  Stop Lasix, KCL and spiro. Take Lokelma 5 g dose x 1 today. Repeat BMET on Friday

## 2023-06-14 NOTE — Telephone Encounter (Signed)
Patient advised and verbalized understanding,lab orders entered, patient will go to [facility] on Tuesday because he already took his potassium today. Med list updated to reflect changes.   Orders Placed This Encounter  Procedures   Basic metabolic panel    Standing Status:   Future    Standing Expiration Date:   06/13/2024    Order Specific Question:   Release to patient    Answer:   Immediate    Order Specific Question:   Release to patient    Answer:   Immediate [1]

## 2023-06-17 ENCOUNTER — Other Ambulatory Visit (HOSPITAL_COMMUNITY): Payer: Self-pay

## 2023-06-17 MED ORDER — LOKELMA 10 G PO PACK: PACK | ORAL | 0 refills | Status: DC

## 2023-06-20 ENCOUNTER — Telehealth: Payer: Self-pay

## 2023-06-20 NOTE — Telephone Encounter (Signed)
ICM call to patient.  Requested he send a manual remote transmission on 9/23 to check his fluid levels and he agreed.    During the call, he reported was not able to get Lokelma dose from the pharmacy due to insurance did not approve it and was going to cost him $2000.  He decided since he did not take the medication that there was no need to labs drawn as instructed.    I explained the importance of having the labs drawn since his Potassium was high and the Creatinine had increased.  Explained the [facility] was to help with reduce the potassium level.   He said if [facility] is still open today, 9/19 he will get the labs drawn or go tomorrow morning.    Phone note sent to [facility] NP to advise that patient did not take [facility] and was advised to get labs done today, 9/19 or tomorrow morning to recheck Potassium and Creatinine.

## 2023-06-21 ENCOUNTER — Telehealth (HOSPITAL_COMMUNITY): Payer: Self-pay | Admitting: Family Medicine

## 2023-06-21 ENCOUNTER — Other Ambulatory Visit (HOSPITAL_COMMUNITY): Payer: Self-pay

## 2023-06-21 ENCOUNTER — Other Ambulatory Visit (HOSPITAL_COMMUNITY): Payer: Self-pay | Admitting: Family Medicine

## 2023-06-21 ENCOUNTER — Other Ambulatory Visit (HOSPITAL_COMMUNITY)
Admission: RE | Admit: 2023-06-21 | Discharge: 2023-06-21 | Disposition: A | Discharge status: Home / Self Care | Payer: Medicare PPO | Source: Ambulatory Visit | Attending: Family Medicine | Admitting: Family Medicine

## 2023-06-21 DIAGNOSIS — I5022 Chronic systolic (congestive) heart failure: Secondary | ICD-10-CM | POA: Insufficient documentation

## 2023-06-21 DIAGNOSIS — Z95810 Presence of automatic (implantable) cardiac defibrillator: Secondary | ICD-10-CM | POA: Diagnosis not present

## 2023-06-21 LAB — BASIC METABOLIC PANEL
Anion gap: 10 (ref 5–15)
BUN: 41 mg/dL — ABNORMAL HIGH (ref 8–23)
CO2: 27 mmol/L (ref 22–32)
Calcium: 8.8 mg/dL — ABNORMAL LOW (ref 8.9–10.3)
Chloride: 101 mmol/L (ref 98–111)
Creatinine, Ser: 2.91 mg/dL — ABNORMAL HIGH (ref 0.61–1.24)
GFR, Estimated: 21 mL/min — ABNORMAL LOW (ref >60)
Glucose, Bld: 114 mg/dL — ABNORMAL HIGH (ref 70–99)
Potassium: 5.1 mmol/L (ref 3.5–5.1)
Sodium: 138 mmol/L (ref 135–145)

## 2023-06-21 NOTE — Telephone Encounter (Signed)
Notified that Lokelma would be $200, patient did not pick up Rx. He has stopped his KCL supp. He is going to [facility] today for labs. If K remains high, he will come by [facility] office and we will provide sample of Lokelma.   Prince Rome, FNP-BC

## 2023-06-21 NOTE — Telephone Encounter (Signed)
Insurance prefers [employer] ($0 co-pay)

## 2023-06-24 ENCOUNTER — Other Ambulatory Visit: Payer: Self-pay

## 2023-06-24 ENCOUNTER — Ambulatory Visit: Payer: Medicare PPO | Attending: Cardiology

## 2023-06-24 DIAGNOSIS — Z95810 Presence of automatic (implantable) cardiac defibrillator: Secondary | ICD-10-CM

## 2023-06-24 DIAGNOSIS — I5022 Chronic systolic (congestive) heart failure: Secondary | ICD-10-CM

## 2023-06-24 MED ORDER — FUROSEMIDE 20 MG PO TABS: 60.0000 mg | ORAL_TABLET | Freq: Every day | ORAL | 3 refills | Status: DC

## 2023-06-24 NOTE — Progress Notes (Signed)
  Received: Today [place], Anderson Malta, FNP  Ellina Sivertsen, Josephine Igo, RN Please increase Lasix to 40 mg bid x 3 days, then change to Lasix 60 mg daily. He will need a BMET in 7-10 days please

## 2023-06-24 NOTE — Progress Notes (Signed)
EPIC Encounter for ICM Monitoring  Patient Name: Chad Brown is a 82 y.o. male Date: 06/24/2023 Primary Care Physican: Cleatis Polka., MD Primary Cardiologist: Nishan/McLean Electrophysiologist: Kathreen Cornfield Pacing: 99.8%   12/19/2022 Weight: 210-211 lbs 01/23/2023 Weight: 211.6 lbs 05/08/2023 Weight: 208 lbs 06/03/2023 Weight: 213 lbs 06/04/2023 Weight: 212 lbs (baseline 208-210 lbs)  06/14/2023 Weight: 204 lbs  06/24/2023 Weight: 214 lbs    Clinical Status (04-Jun-2023 to 24-Jun-2023) Time in AT/AF 0.0 hr/day (0.0%))   Battery Longevity:  ERI reached 9/3.  Pt being seen by Dr Shirlee Latch 10/16 before gen change.     Spoke with patient and heart failure questions reviewed.  Transmission results reviewed.  Pt reports very SOB and weight gain of 5-6 lbs within the last 1 week to 10 days.      Optivol thoracic impedance suggesting possible fluid accumulation startling 9/16.     Prescribed:  Furosemide 20 mg 1 tablet (20 mg total) by mouth twice a day Spironolactone 25 mg Take 1 tablet (25 mg total) by mouth daily. Take at bedtime   Labs: 06/21/2023 Creatinine 2.91, BUN 41, Potassium 5.1, Sodium 138, GFR 21 06/12/2023 Creatinine 3.31, BUN 49, Potassium 5.6, Sodium 134, GFR 18 (Potassium addressed by HF clinic) 04/03/2023 Creatinine 2.46, BUN 35, Potassium 4.6, Sodium 136, GFR 26  10/26/2022 Creatinine 2.00, BUN 28, Potassium 4.3, Sodium 139, GFR 33  10/18/2022 Creatinine 2.10, BUN 34, Potassium 4.0, Sodium 140, GFR 31  10/11/2022 Creatinine 2.30, BUN 36, Potassium 4.0, Sodium 138, GFR 28 10/10/2022 Creatinine 2.50, BUN 36, Potassium 4.1, Sodium 137  A complete set of results can be found in Results Review.   Recommendations:  Copy sent to Prince Rome, NP at [facility] clinic for review and recommendations. He confirmed Lasix is still on hold.     Follow-up plan: ICM clinic phone appointment on 07/08/2023 to recheck fluid levels.  91 day device clinic remote transmission 07/01/2023.      EP/Cardiology Next Office Visit:   07/17/2023 with Dr Shirlee Latch.     Copy of ICM check sent to Dr. Elberta Fortis.     3 month ICM trend: 06/24/2023.    12-14 Month ICM trend:     Karie Soda, RN 06/24/2023 1:00 PM

## 2023-06-24 NOTE — Progress Notes (Signed)
Spoke with patient and advised Chad Rome, NP ordered Lasix 40 mg twice a day x 3 days only.  After 3rd day take 60 mg Lasix every day.  BMET needed in 7-10 days.  He will have labs drawn at [facility] on 9/30.  He does not need a refill on Lasix and has supply on hand.  He did take Lasix 40 mg this AM and advised to take 2nd dosage today which will count as 1 of the 3 days he should take 40 twice a day.  Advised to call HF clinic if his breathing does not improve or worsens over the next 3 days.  He repeated the instructions back correctly and verbalized understanding.

## 2023-06-25 DIAGNOSIS — H401113 Primary open-angle glaucoma, right eye, severe stage: Secondary | ICD-10-CM | POA: Diagnosis not present

## 2023-06-25 DIAGNOSIS — H40012 Open angle with borderline findings, low risk, left eye: Secondary | ICD-10-CM | POA: Diagnosis not present

## 2023-06-25 DIAGNOSIS — H04123 Dry eye syndrome of bilateral lacrimal glands: Secondary | ICD-10-CM | POA: Diagnosis not present

## 2023-07-01 ENCOUNTER — Other Ambulatory Visit (HOSPITAL_COMMUNITY)
Admission: RE | Admit: 2023-07-01 | Discharge: 2023-07-01 | Disposition: A | Discharge status: Home / Self Care | Payer: Medicare PPO | Source: Ambulatory Visit | Attending: Family Medicine | Admitting: Family Medicine

## 2023-07-01 ENCOUNTER — Ambulatory Visit: Payer: Medicare PPO | Attending: Cardiology

## 2023-07-01 DIAGNOSIS — I5022 Chronic systolic (congestive) heart failure: Secondary | ICD-10-CM | POA: Insufficient documentation

## 2023-07-01 DIAGNOSIS — I426 Alcoholic cardiomyopathy: Secondary | ICD-10-CM

## 2023-07-01 LAB — CUP PACEART REMOTE DEVICE CHECK
Battery Remaining Longevity: 1 mo — CL
Battery Voltage: 2.68 V
Brady Statistic AP VP Percent: 99.53 %
Brady Statistic AP VS Percent: 0.08 %
Brady Statistic AS VP Percent: 0.37 %
Brady Statistic AS VS Percent: 0.01 %
Brady Statistic RA Percent Paced: 99.32 %
Brady Statistic RV Percent Paced: 99.68 %
Date Time Interrogation Session: 20240930033526
HighPow Impedance: 68 Ohm
Implantable Lead Connection Status: 753985
Implantable Lead Connection Status: 753985
Implantable Lead Connection Status: 753985
Implantable Lead Implant Date: 20160607
Implantable Lead Implant Date: 20160607
Implantable Lead Implant Date: 20160607
Implantable Lead Location: 753858
Implantable Lead Location: 753859
Implantable Lead Location: 753860
Implantable Lead Model: 4598
Implantable Lead Model: 5076
Implantable Pulse Generator Implant Date: 20160607
Lead Channel Impedance Value: 1045 Ohm
Lead Channel Impedance Value: 1216 Ohm
Lead Channel Impedance Value: 1311 Ohm
Lead Channel Impedance Value: 1406 Ohm
Lead Channel Impedance Value: 342 Ohm
Lead Channel Impedance Value: 399 Ohm
Lead Channel Impedance Value: 437 Ohm
Lead Channel Impedance Value: 570 Ohm
Lead Channel Impedance Value: 589 Ohm
Lead Channel Impedance Value: 703 Ohm
Lead Channel Impedance Value: 817 Ohm
Lead Channel Impedance Value: 836 Ohm
Lead Channel Impedance Value: 855 Ohm
Lead Channel Pacing Threshold Amplitude: 0.5 V
Lead Channel Pacing Threshold Amplitude: 0.5 V
Lead Channel Pacing Threshold Amplitude: 1.25 V
Lead Channel Pacing Threshold Pulse Width: 0.4 ms
Lead Channel Pacing Threshold Pulse Width: 0.4 ms
Lead Channel Pacing Threshold Pulse Width: 0.8 ms
Lead Channel Sensing Intrinsic Amplitude: 1.875 mV
Lead Channel Sensing Intrinsic Amplitude: 1.875 mV
Lead Channel Sensing Intrinsic Amplitude: 14.5 mV
Lead Channel Sensing Intrinsic Amplitude: 14.5 mV
Lead Channel Setting Pacing Amplitude: 1.5 V
Lead Channel Setting Pacing Amplitude: 2 V
Lead Channel Setting Pacing Amplitude: 2 V
Lead Channel Setting Pacing Pulse Width: 0.4 ms
Lead Channel Setting Pacing Pulse Width: 0.8 ms
Lead Channel Setting Sensing Sensitivity: 0.3 mV
Zone Setting Status: 755011
Zone Setting Status: 755011

## 2023-07-01 LAB — BASIC METABOLIC PANEL
Anion gap: 8 (ref 5–15)
BUN: 34 mg/dL — ABNORMAL HIGH (ref 8–23)
CO2: 29 mmol/L (ref 22–32)
Calcium: 8.3 mg/dL — ABNORMAL LOW (ref 8.9–10.3)
Chloride: 102 mmol/L (ref 98–111)
Creatinine, Ser: 2.61 mg/dL — ABNORMAL HIGH (ref 0.61–1.24)
GFR, Estimated: 24 mL/min — ABNORMAL LOW (ref >60)
Glucose, Bld: 108 mg/dL — ABNORMAL HIGH (ref 70–99)
Potassium: 4.3 mmol/L (ref 3.5–5.1)
Sodium: 139 mmol/L (ref 135–145)

## 2023-07-02 ENCOUNTER — Ambulatory Visit (HOSPITAL_BASED_OUTPATIENT_CLINIC_OR_DEPARTMENT_OTHER)
Admission: RE | Admit: 2023-07-02 | Discharge: 2023-07-02 | Disposition: A | Discharge status: Home / Self Care | Payer: Medicare PPO | Source: Ambulatory Visit | Attending: Pulmonary Disease | Admitting: Pulmonary Disease

## 2023-07-02 DIAGNOSIS — R918 Other nonspecific abnormal finding of lung field: Secondary | ICD-10-CM | POA: Diagnosis not present

## 2023-07-02 DIAGNOSIS — J432 Centrilobular emphysema: Secondary | ICD-10-CM | POA: Diagnosis not present

## 2023-07-08 ENCOUNTER — Ambulatory Visit: Payer: Medicare PPO

## 2023-07-08 DIAGNOSIS — Z95810 Presence of automatic (implantable) cardiac defibrillator: Secondary | ICD-10-CM

## 2023-07-08 DIAGNOSIS — I5022 Chronic systolic (congestive) heart failure: Secondary | ICD-10-CM

## 2023-07-11 ENCOUNTER — Other Ambulatory Visit: Payer: Self-pay | Admitting: "Endocrinology

## 2023-07-11 ENCOUNTER — Telehealth: Payer: Self-pay

## 2023-07-11 DIAGNOSIS — C73 Malignant neoplasm of thyroid gland: Secondary | ICD-10-CM

## 2023-07-11 NOTE — Telephone Encounter (Signed)
Pt agreed to send missed ICM Transmission and he agreed to call to set up his gen change next week after his appointment with Dr. Shirlee Latch.

## 2023-07-12 ENCOUNTER — Telehealth: Payer: Self-pay

## 2023-07-12 NOTE — Telephone Encounter (Signed)
Remote ICM transmission received.  Attempted call to patient regarding ICM remote transmission and left detailed message per DPR with ICM phone number to return call.    

## 2023-07-12 NOTE — Progress Notes (Signed)
EPIC Encounter for ICM Monitoring  Patient Name: Chad Brown is a 82 y.o. male Date: 07/12/2023 Primary Care Physican: Cleatis Polka., MD Primary Cardiologist: Nishan/McLean Electrophysiologist: Kathreen Cornfield Pacing: 99.1%   12/19/2022 Weight: 210-211 lbs 01/23/2023 Weight: 211.6 lbs 05/08/2023 Weight: 208 lbs 06/03/2023 Weight: 213 lbs 06/04/2023 Weight: 212 lbs (baseline 208-210 lbs)  06/14/2023 Weight: 204 lbs  06/24/2023 Weight: 214 lbs 07/12/2023 Weight: 210 lbs (baseline)    Clinical Status  Since 01-Jul-2023 AT/AF    19 Time in AT/AF  0.2 hr/day (0.7%) Longest AT/AF  14 minutes   Battery Longevity:  ERI reached 9/3.  Pt being seen by Dr Shirlee Latch 10/16 before gen change.     Spoke with patient and heart failure questions reviewed.  Transmission results reviewed.  Pt reports, since last week, breathing has improved but still SOB on minimal exertion and weight decreased 4 lbs.  He is feeling better since last week.      Optivol thoracic impedance suggesting possible fluid accumulation startling 9/16.     Prescribed:  Furosemide 20 mg take 3 tablets (60 mg total) by mouth daily (increased to 60 mg on 9/26) Spironolactone 25 mg Take 1 tablet (25 mg total) by mouth daily. Take at bedtime   Labs: 07/01/2023 Creatinine 2.61, BUN 34, Potassium 4.3, Sodium 139, GFR 24 06/21/2023 Creatinine 2.91, BUN 41, Potassium 5.1, Sodium 138, GFR 21 06/12/2023 Creatinine 3.31, BUN 49, Potassium 5.6, Sodium 134, GFR 18 (Potassium addressed by HF clinic) 04/03/2023 Creatinine 2.46, BUN 35, Potassium 4.6, Sodium 136, GFR 26  10/26/2022 Creatinine 2.00, BUN 28, Potassium 4.3, Sodium 139, GFR 33  10/18/2022 Creatinine 2.10, BUN 34, Potassium 4.0, Sodium 140, GFR 31  10/11/2022 Creatinine 2.30, BUN 36, Potassium 4.0, Sodium 138, GFR 28 10/10/2022 Creatinine 2.50, BUN 36, Potassium 4.1, Sodium 137  A complete set of results can be found in Results Review.   Recommendations:  Copy sent to Dr Shirlee Latch  for review and recommendations.    Follow-up plan: ICM clinic phone appointment on 07/30/2023 (fluid level check will be in office 10/16).  91 day device clinic remote transmission pending battery replacement.     EP/Cardiology Next Office Visit:   07/17/2023 with Dr Shirlee Latch.     Copy of ICM check sent to Dr. Elberta Fortis.    3 month ICM trend: 07/11/2023.    12-14 Month ICM trend:     Karie Soda, RN 07/12/2023 9:18 AM

## 2023-07-14 NOTE — Progress Notes (Signed)
Increase Lasix to 80 mg daily, BMET 1 week.

## 2023-07-15 ENCOUNTER — Other Ambulatory Visit: Payer: Self-pay

## 2023-07-15 DIAGNOSIS — I5022 Chronic systolic (congestive) heart failure: Secondary | ICD-10-CM

## 2023-07-15 DIAGNOSIS — Z95810 Presence of automatic (implantable) cardiac defibrillator: Secondary | ICD-10-CM

## 2023-07-15 MED ORDER — FUROSEMIDE 20 MG PO TABS
80.0000 mg | ORAL_TABLET | Freq: Every day | ORAL | Status: DC
Start: 2023-07-15 — End: 2023-07-17

## 2023-07-15 NOTE — Addendum Note (Signed)
Addended by: Karie Soda on: 07/15/2023 01:44 PM   Modules accepted: Orders

## 2023-07-15 NOTE — Progress Notes (Signed)
Spoke with patient and advised Dr Shirlee Latch recommended to increase Furosemide to 80 mg by mouth daily.  Will need labs in a week.  He agreed to recommendation and verbalized understanding.  He will have labs drawn at [facility] lab on 10/22.  He has Lasix on hand and does not need a refill at this time.

## 2023-07-15 NOTE — Progress Notes (Signed)
Remote ICD transmission.

## 2023-07-17 ENCOUNTER — Other Ambulatory Visit (HOSPITAL_COMMUNITY): Payer: Self-pay | Admitting: *Deleted

## 2023-07-17 ENCOUNTER — Ambulatory Visit (HOSPITAL_COMMUNITY)
Admission: RE | Admit: 2023-07-17 | Discharge: 2023-07-17 | Disposition: A | Discharge status: Home / Self Care | Payer: Medicare PPO | Source: Ambulatory Visit | Attending: Cardiology | Admitting: Cardiology

## 2023-07-17 ENCOUNTER — Encounter (HOSPITAL_COMMUNITY): Payer: Self-pay | Admitting: Cardiology

## 2023-07-17 VITALS — BP 110/70 | HR 65 | Wt 216.4 lb

## 2023-07-17 DIAGNOSIS — I340 Nonrheumatic mitral (valve) insufficiency: Secondary | ICD-10-CM

## 2023-07-17 DIAGNOSIS — E039 Hypothyroidism, unspecified: Secondary | ICD-10-CM | POA: Diagnosis not present

## 2023-07-17 DIAGNOSIS — I130 Hypertensive heart and chronic kidney disease with heart failure and stage 1 through stage 4 chronic kidney disease, or unspecified chronic kidney disease: Secondary | ICD-10-CM | POA: Diagnosis not present

## 2023-07-17 DIAGNOSIS — E785 Hyperlipidemia, unspecified: Secondary | ICD-10-CM

## 2023-07-17 DIAGNOSIS — I2510 Atherosclerotic heart disease of native coronary artery without angina pectoris: Secondary | ICD-10-CM | POA: Insufficient documentation

## 2023-07-17 DIAGNOSIS — R0602 Shortness of breath: Secondary | ICD-10-CM | POA: Diagnosis present

## 2023-07-17 DIAGNOSIS — I480 Paroxysmal atrial fibrillation: Secondary | ICD-10-CM | POA: Insufficient documentation

## 2023-07-17 DIAGNOSIS — I5022 Chronic systolic (congestive) heart failure: Secondary | ICD-10-CM | POA: Diagnosis not present

## 2023-07-17 DIAGNOSIS — R9431 Abnormal electrocardiogram [ECG] [EKG]: Secondary | ICD-10-CM | POA: Insufficient documentation

## 2023-07-17 DIAGNOSIS — I429 Cardiomyopathy, unspecified: Secondary | ICD-10-CM | POA: Insufficient documentation

## 2023-07-17 DIAGNOSIS — Z7901 Long term (current) use of anticoagulants: Secondary | ICD-10-CM | POA: Diagnosis not present

## 2023-07-17 DIAGNOSIS — N183 Chronic kidney disease, stage 3 unspecified: Secondary | ICD-10-CM | POA: Insufficient documentation

## 2023-07-17 DIAGNOSIS — I739 Peripheral vascular disease, unspecified: Secondary | ICD-10-CM | POA: Diagnosis not present

## 2023-07-17 DIAGNOSIS — Z79899 Other long term (current) drug therapy: Secondary | ICD-10-CM | POA: Diagnosis not present

## 2023-07-17 DIAGNOSIS — G4733 Obstructive sleep apnea (adult) (pediatric): Secondary | ICD-10-CM | POA: Diagnosis not present

## 2023-07-17 DIAGNOSIS — Z7984 Long term (current) use of oral hypoglycemic drugs: Secondary | ICD-10-CM | POA: Insufficient documentation

## 2023-07-17 DIAGNOSIS — Z950 Presence of cardiac pacemaker: Secondary | ICD-10-CM | POA: Diagnosis not present

## 2023-07-17 DIAGNOSIS — I252 Old myocardial infarction: Secondary | ICD-10-CM | POA: Diagnosis not present

## 2023-07-17 LAB — CBC
HCT: 36.2 % — ABNORMAL LOW (ref 39.0–52.0)
Hemoglobin: 11.4 g/dL — ABNORMAL LOW (ref 13.0–17.0)
MCH: 31.9 pg (ref 26.0–34.0)
MCHC: 31.5 g/dL (ref 30.0–36.0)
MCV: 101.4 fL — ABNORMAL HIGH (ref 80.0–100.0)
Platelets: 158 10*3/uL (ref 150–400)
RBC: 3.57 MIL/uL — ABNORMAL LOW (ref 4.22–5.81)
RDW: 15.5 % (ref 11.5–15.5)
WBC: 5.4 10*3/uL (ref 4.0–10.5)
nRBC: 0 % (ref 0.0–0.2)

## 2023-07-17 LAB — LIPID PANEL
Cholesterol: 103 mg/dL (ref 0–200)
HDL: 28 mg/dL — ABNORMAL LOW (ref >40)
LDL Cholesterol: 44 mg/dL (ref 0–99)
Total CHOL/HDL Ratio: 3.7 {ratio}
Triglycerides: 155 mg/dL — ABNORMAL HIGH (ref <150)
VLDL: 31 mg/dL (ref 0–40)

## 2023-07-17 LAB — COMPREHENSIVE METABOLIC PANEL
ALT: 16 U/L (ref 0–44)
AST: 16 U/L (ref 15–41)
Albumin: 3.3 g/dL — ABNORMAL LOW (ref 3.5–5.0)
Alkaline Phosphatase: 61 U/L (ref 38–126)
Anion gap: 10 (ref 5–15)
BUN: 28 mg/dL — ABNORMAL HIGH (ref 8–23)
CO2: 28 mmol/L (ref 22–32)
Calcium: 9.6 mg/dL (ref 8.9–10.3)
Chloride: 105 mmol/L (ref 98–111)
Creatinine, Ser: 2.41 mg/dL — ABNORMAL HIGH (ref 0.61–1.24)
GFR, Estimated: 26 mL/min — ABNORMAL LOW (ref >60)
Glucose, Bld: 108 mg/dL — ABNORMAL HIGH (ref 70–99)
Potassium: 4.7 mmol/L (ref 3.5–5.1)
Sodium: 143 mmol/L (ref 135–145)
Total Bilirubin: 0.6 mg/dL (ref 0.3–1.2)
Total Protein: 7.5 g/dL (ref 6.5–8.1)

## 2023-07-17 LAB — ECHOCARDIOGRAM COMPLETE
Area-P 1/2: 3.99 cm2
Calc EF: 38.9 %
MV M vel: 4.33 m/s
MV Peak grad: 75 mm[Hg]
MV VTI: 1.6 cm2
Radius: 0.75 cm
S' Lateral: 4.6 cm
Single Plane A2C EF: 38.8 %
Single Plane A4C EF: 39.7 %

## 2023-07-17 LAB — TSH: TSH: 1.574 u[IU]/mL (ref 0.350–4.500)

## 2023-07-17 LAB — BRAIN NATRIURETIC PEPTIDE: B Natriuretic Peptide: 410.5 pg/mL — ABNORMAL HIGH (ref 0.0–100.0)

## 2023-07-17 MED ORDER — FUROSEMIDE 20 MG PO TABS: ORAL_TABLET | ORAL | 3 refills | Status: DC

## 2023-07-17 NOTE — Patient Instructions (Signed)
Medication Changes:  Increase Furosemide to 80 mg (4 tabs) every AM and 40 mg (2 tabs) every PM for 3 DAYS, then:  Take 80 mg (4 tabs) every AM and 40 mg (2 tabs) every other day in PM  Lab Work:  Labs done today, your results will be available in MyChart, we will contact you for abnormal readings.  Your physician recommends that you return for lab work in: 1 week, orders are in for this to be done at [facility]  Testing/Procedures:  Your physician has requested that you have a TEE. During a TEE, sound waves are used to create images of your heart. It provides your doctor with information about the size and shape of your heart and how well your heart's chambers and valves are working. In this test, a transducer is attached to the end of a flexible tube that's guided down your throat and into your esophagus (the tube leading from you mouth to your stomach) to get a more detailed image of your heart. You are not awake for the procedure. Please see the instruction sheet given to you today. For further information please visit [email or web address] SEE INSTRUCTIONS BELOW  Heart Catheterization, SEE INSTRUCTIONS BELOW  Referrals:  none  Special Instructions // Education:  Do the following things EVERYDAY: Weigh yourself in the morning before breakfast. Write it down and keep it in a log. Take your medicines as prescribed Eat low salt foods--Limit salt (sodium) to 2000 mg per day.  Stay as active as you can everyday Limit all fluids for the day to less than 2 liters   TEE and CATHETERIZATION ORDERS  You are scheduled for a TEE AND Cardiac Catheterization on Monday, November 4 with Dr. Marca Ancona.  1. Please arrive at the [facility] (Main Entrance A) at [facility]: [address] [place], [place] [address] at 8:30 AM (This time is 1 hour(s) before your procedure to ensure your preparation).  Free valet parking service is available. You will check in at ADMITTING. The support  person will be asked to wait in the waiting room.  It is OK to have someone drop you off and come back when you are ready to be discharged.    Special note: Every effort is made to have your procedure done on time. Please understand that emergencies sometimes delay scheduled procedures.  2. Diet: Do not eat solid foods after midnight.  The patient may have clear liquids until 5am upon the day of the procedure.  3. Labs: DONE TODAY  4. Medication instructions in preparation for your procedure:   DO NOT TAKE JARDIANCE after Thursday 10/31 dose, restart after procedures  DO NOT TAKE XARELTO SUNDAY 11/3 OR MONDAY 11/4  DO NOT TAKE FUROSEMIDE MONDAY 11/4   On the morning of your procedure, take any medications not listed above with a sip of water.  5. Plan to go home the same day, you will only stay overnight if medically necessary. 6. Bring a current list of your medications and current insurance cards. 7. You MUST have a responsible person to drive you home. 8. Someone MUST be with you the first 24 hours after you arrive home or your discharge will be delayed. 9. Please wear clothes that are easy to get on and off and wear slip-on shoes.  Thank you for allowing [place] to care for you!   -- [facility]   Follow-Up in: 1 month   At the [facility],  you and your health needs are our priority. We have a designated team specialized in the treatment of Heart Failure. This Care Team includes your primary Heart Failure Specialized Cardiologist (physician), Advanced Practice Providers (APPs- Physician Assistants and Nurse Practitioners), and Pharmacist who all work together to provide you with the care you need, when you need it.   You may see any of the following providers on your designated Care Team at your next follow up:  Dr. Arvilla Meres Dr. Marca Ancona Dr. Dorthula Nettles Dr. Theresia Bough Tonye Becket, NP Robbie Lis, [place] [facility] [place], [place] Brynda Peon, NP [place] Lee, NP Karle Plumber, PharmD   Please be sure to bring in all your medications bottles to every appointment.   Need to Contact us:  If you have any questions or concerns before your next appointment please send [place] a message through Universal or call our office at [phone].    TO LEAVE A MESSAGE FOR THE NURSE SELECT OPTION 2, PLEASE LEAVE A MESSAGE INCLUDING: YOUR NAME DATE OF BIRTH CALL BACK NUMBER REASON FOR CALL**this is important as we prioritize the call backs  YOU WILL RECEIVE A CALL BACK THE SAME DAY AS LONG AS YOU CALL BEFORE 4:00 PM

## 2023-07-17 NOTE — Progress Notes (Signed)
Patient ID: Chad Brown, male   DOB: [birth year], 82 y.o.   MRN: [id number] PCP: Cleatis Polka., MD HF Cardiologist: Shirlee Latch  82 y.o. with paroxysmal atrial fibrillation and chronic systolic CHF thought to be due to nonischemic cardiomyopathy presents for followup of CHF. He has a cardiac history dating back to 1998, when he was admitted with chest pain and had cardiac cath showing nonobstructive CAD. In 4/14, he was found to be in atrial fibrillation.  Echo showed EF 30-35%.  He had DCCV back to NSR.  He was back in atrial fibrillation in 1/16, and EF was low again on TEE at that time.   Again, he had DCCV.  In 5/16, cardiac MRI showed persistently low EF, so given LBBB, he had Medtronic CRT-D device placed.  He was back in atrial fibrillation in 6/16 and had TEE-guided DCCV again with EF now 15-20% on TEE.  Repeat echo in 2/17 showed EF 55-60%. Echo in 1/19 showed EF 45-50%, mild LV and RV dilation.   Echo in 1/20 showed EF 45-50%, diffuse hypokinesis.   Atrial fibrillation ablation was done in 2/21.  Echo in 2/21 showed EF 40-45%, PASP 50 mmHg, mild MR. Echo in 6/22 also showed EF 40-45%.   Patient was admitted in 1/24 after a syncopal episode.  He had gone to the gym and exercised and was standing talking to someone when he got lightheaded and passed out.  No arrhythmia on device interrogation.  He was hypotensive and orthostatic in the ER. Coreg and Entresto were stopped and Lasix was decreased to 20 mg bid.    Spironolactone was stopped due to hyperkalemia.   Echo was done today and reviewed, EF 40-45%, mild LV dilation, mildly decreased RV systolic function, moderate-severe MR with splay artifact and PISA ERO 0.44 cm^2, PASP 71 mmHg, dilated IVC.    Today he returns for HF follow up with his wife.  He has been more short of breath recently.  Weight is stable.  He is dyspneic with hills and stairs.  He is orthopneic, has to sleep on his side. No lightheadedness.  No chest pain. Lasix was  increased yesterday from 60 mg daily to 80 mg daily.   Medtronic device interrogation: Fluid index > threshold with decreased thoracic impedance, >99% BiV pacing.   ECG (personally reviewed): A-BiV pacing  Labs (10/19): K 4.7, creatinine 1.66 => 1.58, LFTs normal Labs (1/20): LDL 128 Labs (11/20): K 3.6, creatinine 1.62 Labs (1/21): K 3.8, creatinine 1.89, BNP 578, LDL 115, TSH normal, LFTs normal Labs (3/21): K 4.7, creatinine 2.12 Labs (1/22): K 3.3, creatinine 1.34 Labs (5/22): LDL 42 Labs (8/22): K 3.8, creatinine 1.45 Labs (1/24): K 4, creatinine 2.3 Labs (2/24): K 5.1, creatinine 2.23, LDL 48 Labs (10/24): K 4.3, creatinine 2.61  PMH: 1. Atrial fibrillation: Paroxysmal.  Diagnosed 4/14, DCCV 4/14 to NSR.  DCCV 1/16 to NSR.  DCCV 6/16 to NSR.  2. Chronic systolic CHF: Nonischemic cardiomyopathy.  LHC in 1998 with nonobstructive disease.  Cardiolite in 6/14 with no ischemia or infarction.  cMRI (5/16) with EF 34%, mildly dilated LV with diffuse HK worse in anterolateral wall, small punctate areas of LGE in anteroseptum and basal inferior wall (not CAD pattern).  TEE (6/16) with EF 15-20%.  He has Medtronic CRT-D device.  Echo (9/16) with EF 40%, moderate LV dilation, grade II diastolic dysfunction, normal RV size and systolic function, PASP 42 mmHg.  - Hypotension with Entresto.  - Echo (2/17) showed LV  Patient ID: Chad Brown, male   DOB: [birth year], 82 y.o.   MRN: [id number] PCP: Cleatis Polka., MD HF Cardiologist: Shirlee Latch  82 y.o. with paroxysmal atrial fibrillation and chronic systolic CHF thought to be due to nonischemic cardiomyopathy presents for followup of CHF. He has a cardiac history dating back to 1998, when he was admitted with chest pain and had cardiac cath showing nonobstructive CAD. In 4/14, he was found to be in atrial fibrillation.  Echo showed EF 30-35%.  He had DCCV back to NSR.  He was back in atrial fibrillation in 1/16, and EF was low again on TEE at that time.   Again, he had DCCV.  In 5/16, cardiac MRI showed persistently low EF, so given LBBB, he had Medtronic CRT-D device placed.  He was back in atrial fibrillation in 6/16 and had TEE-guided DCCV again with EF now 15-20% on TEE.  Repeat echo in 2/17 showed EF 55-60%. Echo in 1/19 showed EF 45-50%, mild LV and RV dilation.   Echo in 1/20 showed EF 45-50%, diffuse hypokinesis.   Atrial fibrillation ablation was done in 2/21.  Echo in 2/21 showed EF 40-45%, PASP 50 mmHg, mild MR. Echo in 6/22 also showed EF 40-45%.   Patient was admitted in 1/24 after a syncopal episode.  He had gone to the gym and exercised and was standing talking to someone when he got lightheaded and passed out.  No arrhythmia on device interrogation.  He was hypotensive and orthostatic in the ER. Coreg and Entresto were stopped and Lasix was decreased to 20 mg bid.    Spironolactone was stopped due to hyperkalemia.   Echo was done today and reviewed, EF 40-45%, mild LV dilation, mildly decreased RV systolic function, moderate-severe MR with splay artifact and PISA ERO 0.44 cm^2, PASP 71 mmHg, dilated IVC.    Today he returns for HF follow up with his wife.  He has been more short of breath recently.  Weight is stable.  He is dyspneic with hills and stairs.  He is orthopneic, has to sleep on his side. No lightheadedness.  No chest pain. Lasix was  increased yesterday from 60 mg daily to 80 mg daily.   Medtronic device interrogation: Fluid index > threshold with decreased thoracic impedance, >99% BiV pacing.   ECG (personally reviewed): A-BiV pacing  Labs (10/19): K 4.7, creatinine 1.66 => 1.58, LFTs normal Labs (1/20): LDL 128 Labs (11/20): K 3.6, creatinine 1.62 Labs (1/21): K 3.8, creatinine 1.89, BNP 578, LDL 115, TSH normal, LFTs normal Labs (3/21): K 4.7, creatinine 2.12 Labs (1/22): K 3.3, creatinine 1.34 Labs (5/22): LDL 42 Labs (8/22): K 3.8, creatinine 1.45 Labs (1/24): K 4, creatinine 2.3 Labs (2/24): K 5.1, creatinine 2.23, LDL 48 Labs (10/24): K 4.3, creatinine 2.61  PMH: 1. Atrial fibrillation: Paroxysmal.  Diagnosed 4/14, DCCV 4/14 to NSR.  DCCV 1/16 to NSR.  DCCV 6/16 to NSR.  2. Chronic systolic CHF: Nonischemic cardiomyopathy.  LHC in 1998 with nonobstructive disease.  Cardiolite in 6/14 with no ischemia or infarction.  cMRI (5/16) with EF 34%, mildly dilated LV with diffuse HK worse in anterolateral wall, small punctate areas of LGE in anteroseptum and basal inferior wall (not CAD pattern).  TEE (6/16) with EF 15-20%.  He has Medtronic CRT-D device.  Echo (9/16) with EF 40%, moderate LV dilation, grade II diastolic dysfunction, normal RV size and systolic function, PASP 42 mmHg.  - Hypotension with Entresto.  - Echo (2/17) showed LV  Patient ID: Chad Brown, male   DOB: [birth year], 82 y.o.   MRN: [id number] PCP: Cleatis Polka., MD HF Cardiologist: Shirlee Latch  82 y.o. with paroxysmal atrial fibrillation and chronic systolic CHF thought to be due to nonischemic cardiomyopathy presents for followup of CHF. He has a cardiac history dating back to 1998, when he was admitted with chest pain and had cardiac cath showing nonobstructive CAD. In 4/14, he was found to be in atrial fibrillation.  Echo showed EF 30-35%.  He had DCCV back to NSR.  He was back in atrial fibrillation in 1/16, and EF was low again on TEE at that time.   Again, he had DCCV.  In 5/16, cardiac MRI showed persistently low EF, so given LBBB, he had Medtronic CRT-D device placed.  He was back in atrial fibrillation in 6/16 and had TEE-guided DCCV again with EF now 15-20% on TEE.  Repeat echo in 2/17 showed EF 55-60%. Echo in 1/19 showed EF 45-50%, mild LV and RV dilation.   Echo in 1/20 showed EF 45-50%, diffuse hypokinesis.   Atrial fibrillation ablation was done in 2/21.  Echo in 2/21 showed EF 40-45%, PASP 50 mmHg, mild MR. Echo in 6/22 also showed EF 40-45%.   Patient was admitted in 1/24 after a syncopal episode.  He had gone to the gym and exercised and was standing talking to someone when he got lightheaded and passed out.  No arrhythmia on device interrogation.  He was hypotensive and orthostatic in the ER. Coreg and Entresto were stopped and Lasix was decreased to 20 mg bid.    Spironolactone was stopped due to hyperkalemia.   Echo was done today and reviewed, EF 40-45%, mild LV dilation, mildly decreased RV systolic function, moderate-severe MR with splay artifact and PISA ERO 0.44 cm^2, PASP 71 mmHg, dilated IVC.    Today he returns for HF follow up with his wife.  He has been more short of breath recently.  Weight is stable.  He is dyspneic with hills and stairs.  He is orthopneic, has to sleep on his side. No lightheadedness.  No chest pain. Lasix was  increased yesterday from 60 mg daily to 80 mg daily.   Medtronic device interrogation: Fluid index > threshold with decreased thoracic impedance, >99% BiV pacing.   ECG (personally reviewed): A-BiV pacing  Labs (10/19): K 4.7, creatinine 1.66 => 1.58, LFTs normal Labs (1/20): LDL 128 Labs (11/20): K 3.6, creatinine 1.62 Labs (1/21): K 3.8, creatinine 1.89, BNP 578, LDL 115, TSH normal, LFTs normal Labs (3/21): K 4.7, creatinine 2.12 Labs (1/22): K 3.3, creatinine 1.34 Labs (5/22): LDL 42 Labs (8/22): K 3.8, creatinine 1.45 Labs (1/24): K 4, creatinine 2.3 Labs (2/24): K 5.1, creatinine 2.23, LDL 48 Labs (10/24): K 4.3, creatinine 2.61  PMH: 1. Atrial fibrillation: Paroxysmal.  Diagnosed 4/14, DCCV 4/14 to NSR.  DCCV 1/16 to NSR.  DCCV 6/16 to NSR.  2. Chronic systolic CHF: Nonischemic cardiomyopathy.  LHC in 1998 with nonobstructive disease.  Cardiolite in 6/14 with no ischemia or infarction.  cMRI (5/16) with EF 34%, mildly dilated LV with diffuse HK worse in anterolateral wall, small punctate areas of LGE in anteroseptum and basal inferior wall (not CAD pattern).  TEE (6/16) with EF 15-20%.  He has Medtronic CRT-D device.  Echo (9/16) with EF 40%, moderate LV dilation, grade II diastolic dysfunction, normal RV size and systolic function, PASP 42 mmHg.  - Hypotension with Entresto.  - Echo (2/17) showed LV  Patient ID: Chad Brown, male   DOB: [birth year], 82 y.o.   MRN: [id number] PCP: Cleatis Polka., MD HF Cardiologist: Shirlee Latch  82 y.o. with paroxysmal atrial fibrillation and chronic systolic CHF thought to be due to nonischemic cardiomyopathy presents for followup of CHF. He has a cardiac history dating back to 1998, when he was admitted with chest pain and had cardiac cath showing nonobstructive CAD. In 4/14, he was found to be in atrial fibrillation.  Echo showed EF 30-35%.  He had DCCV back to NSR.  He was back in atrial fibrillation in 1/16, and EF was low again on TEE at that time.   Again, he had DCCV.  In 5/16, cardiac MRI showed persistently low EF, so given LBBB, he had Medtronic CRT-D device placed.  He was back in atrial fibrillation in 6/16 and had TEE-guided DCCV again with EF now 15-20% on TEE.  Repeat echo in 2/17 showed EF 55-60%. Echo in 1/19 showed EF 45-50%, mild LV and RV dilation.   Echo in 1/20 showed EF 45-50%, diffuse hypokinesis.   Atrial fibrillation ablation was done in 2/21.  Echo in 2/21 showed EF 40-45%, PASP 50 mmHg, mild MR. Echo in 6/22 also showed EF 40-45%.   Patient was admitted in 1/24 after a syncopal episode.  He had gone to the gym and exercised and was standing talking to someone when he got lightheaded and passed out.  No arrhythmia on device interrogation.  He was hypotensive and orthostatic in the ER. Coreg and Entresto were stopped and Lasix was decreased to 20 mg bid.    Spironolactone was stopped due to hyperkalemia.   Echo was done today and reviewed, EF 40-45%, mild LV dilation, mildly decreased RV systolic function, moderate-severe MR with splay artifact and PISA ERO 0.44 cm^2, PASP 71 mmHg, dilated IVC.    Today he returns for HF follow up with his wife.  He has been more short of breath recently.  Weight is stable.  He is dyspneic with hills and stairs.  He is orthopneic, has to sleep on his side. No lightheadedness.  No chest pain. Lasix was  increased yesterday from 60 mg daily to 80 mg daily.   Medtronic device interrogation: Fluid index > threshold with decreased thoracic impedance, >99% BiV pacing.   ECG (personally reviewed): A-BiV pacing  Labs (10/19): K 4.7, creatinine 1.66 => 1.58, LFTs normal Labs (1/20): LDL 128 Labs (11/20): K 3.6, creatinine 1.62 Labs (1/21): K 3.8, creatinine 1.89, BNP 578, LDL 115, TSH normal, LFTs normal Labs (3/21): K 4.7, creatinine 2.12 Labs (1/22): K 3.3, creatinine 1.34 Labs (5/22): LDL 42 Labs (8/22): K 3.8, creatinine 1.45 Labs (1/24): K 4, creatinine 2.3 Labs (2/24): K 5.1, creatinine 2.23, LDL 48 Labs (10/24): K 4.3, creatinine 2.61  PMH: 1. Atrial fibrillation: Paroxysmal.  Diagnosed 4/14, DCCV 4/14 to NSR.  DCCV 1/16 to NSR.  DCCV 6/16 to NSR.  2. Chronic systolic CHF: Nonischemic cardiomyopathy.  LHC in 1998 with nonobstructive disease.  Cardiolite in 6/14 with no ischemia or infarction.  cMRI (5/16) with EF 34%, mildly dilated LV with diffuse HK worse in anterolateral wall, small punctate areas of LGE in anteroseptum and basal inferior wall (not CAD pattern).  TEE (6/16) with EF 15-20%.  He has Medtronic CRT-D device.  Echo (9/16) with EF 40%, moderate LV dilation, grade II diastolic dysfunction, normal RV size and systolic function, PASP 42 mmHg.  - Hypotension with Entresto.  - Echo (2/17) showed LV

## 2023-07-17 NOTE — H&P (View-Only) (Signed)
Patient ID: Chad Brown, male   DOB: [birth year], 82 y.o.   MRN: [id number] PCP: Cleatis Polka., MD HF Cardiologist: Shirlee Latch  82 y.o. with paroxysmal atrial fibrillation and chronic systolic CHF thought to be due to nonischemic cardiomyopathy presents for followup of CHF. He has a cardiac history dating back to 1998, when he was admitted with chest pain and had cardiac cath showing nonobstructive CAD. In 4/14, he was found to be in atrial fibrillation.  Echo showed EF 30-35%.  He had DCCV back to NSR.  He was back in atrial fibrillation in 1/16, and EF was low again on TEE at that time.   Again, he had DCCV.  In 5/16, cardiac MRI showed persistently low EF, so given LBBB, he had Medtronic CRT-D device placed.  He was back in atrial fibrillation in 6/16 and had TEE-guided DCCV again with EF now 15-20% on TEE.  Repeat echo in 2/17 showed EF 55-60%. Echo in 1/19 showed EF 45-50%, mild LV and RV dilation.   Echo in 1/20 showed EF 45-50%, diffuse hypokinesis.   Atrial fibrillation ablation was done in 2/21.  Echo in 2/21 showed EF 40-45%, PASP 50 mmHg, mild MR. Echo in 6/22 also showed EF 40-45%.   Patient was admitted in 1/24 after a syncopal episode.  He had gone to the gym and exercised and was standing talking to someone when he got lightheaded and passed out.  No arrhythmia on device interrogation.  He was hypotensive and orthostatic in the ER. Coreg and Entresto were stopped and Lasix was decreased to 20 mg bid.    Spironolactone was stopped due to hyperkalemia.   Echo was done today and reviewed, EF 40-45%, mild LV dilation, mildly decreased RV systolic function, moderate-severe MR with splay artifact and PISA ERO 0.44 cm^2, PASP 71 mmHg, dilated IVC.    Today he returns for HF follow up with his wife.  He has been more short of breath recently.  Weight is stable.  He is dyspneic with hills and stairs.  He is orthopneic, has to sleep on his side. No lightheadedness.  No chest pain. Lasix was  increased yesterday from 60 mg daily to 80 mg daily.   Medtronic device interrogation: Fluid index > threshold with decreased thoracic impedance, >99% BiV pacing.   ECG (personally reviewed): A-BiV pacing  Labs (10/19): K 4.7, creatinine 1.66 => 1.58, LFTs normal Labs (1/20): LDL 128 Labs (11/20): K 3.6, creatinine 1.62 Labs (1/21): K 3.8, creatinine 1.89, BNP 578, LDL 115, TSH normal, LFTs normal Labs (3/21): K 4.7, creatinine 2.12 Labs (1/22): K 3.3, creatinine 1.34 Labs (5/22): LDL 42 Labs (8/22): K 3.8, creatinine 1.45 Labs (1/24): K 4, creatinine 2.3 Labs (2/24): K 5.1, creatinine 2.23, LDL 48 Labs (10/24): K 4.3, creatinine 2.61  PMH: 1. Atrial fibrillation: Paroxysmal.  Diagnosed 4/14, DCCV 4/14 to NSR.  DCCV 1/16 to NSR.  DCCV 6/16 to NSR.  2. Chronic systolic CHF: Nonischemic cardiomyopathy.  LHC in 1998 with nonobstructive disease.  Cardiolite in 6/14 with no ischemia or infarction.  cMRI (5/16) with EF 34%, mildly dilated LV with diffuse HK worse in anterolateral wall, small punctate areas of LGE in anteroseptum and basal inferior wall (not CAD pattern).  TEE (6/16) with EF 15-20%.  He has Medtronic CRT-D device.  Echo (9/16) with EF 40%, moderate LV dilation, grade II diastolic dysfunction, normal RV size and systolic function, PASP 42 mmHg.  - Hypotension with Entresto.  - Echo (2/17) showed LV  Patient ID: Chad Brown, male   DOB: [birth year], 82 y.o.   MRN: [id number] PCP: Cleatis Polka., MD HF Cardiologist: Shirlee Latch  82 y.o. with paroxysmal atrial fibrillation and chronic systolic CHF thought to be due to nonischemic cardiomyopathy presents for followup of CHF. He has a cardiac history dating back to 1998, when he was admitted with chest pain and had cardiac cath showing nonobstructive CAD. In 4/14, he was found to be in atrial fibrillation.  Echo showed EF 30-35%.  He had DCCV back to NSR.  He was back in atrial fibrillation in 1/16, and EF was low again on TEE at that time.   Again, he had DCCV.  In 5/16, cardiac MRI showed persistently low EF, so given LBBB, he had Medtronic CRT-D device placed.  He was back in atrial fibrillation in 6/16 and had TEE-guided DCCV again with EF now 15-20% on TEE.  Repeat echo in 2/17 showed EF 55-60%. Echo in 1/19 showed EF 45-50%, mild LV and RV dilation.   Echo in 1/20 showed EF 45-50%, diffuse hypokinesis.   Atrial fibrillation ablation was done in 2/21.  Echo in 2/21 showed EF 40-45%, PASP 50 mmHg, mild MR. Echo in 6/22 also showed EF 40-45%.   Patient was admitted in 1/24 after a syncopal episode.  He had gone to the gym and exercised and was standing talking to someone when he got lightheaded and passed out.  No arrhythmia on device interrogation.  He was hypotensive and orthostatic in the ER. Coreg and Entresto were stopped and Lasix was decreased to 20 mg bid.    Spironolactone was stopped due to hyperkalemia.   Echo was done today and reviewed, EF 40-45%, mild LV dilation, mildly decreased RV systolic function, moderate-severe MR with splay artifact and PISA ERO 0.44 cm^2, PASP 71 mmHg, dilated IVC.    Today he returns for HF follow up with his wife.  He has been more short of breath recently.  Weight is stable.  He is dyspneic with hills and stairs.  He is orthopneic, has to sleep on his side. No lightheadedness.  No chest pain. Lasix was  increased yesterday from 60 mg daily to 80 mg daily.   Medtronic device interrogation: Fluid index > threshold with decreased thoracic impedance, >99% BiV pacing.   ECG (personally reviewed): A-BiV pacing  Labs (10/19): K 4.7, creatinine 1.66 => 1.58, LFTs normal Labs (1/20): LDL 128 Labs (11/20): K 3.6, creatinine 1.62 Labs (1/21): K 3.8, creatinine 1.89, BNP 578, LDL 115, TSH normal, LFTs normal Labs (3/21): K 4.7, creatinine 2.12 Labs (1/22): K 3.3, creatinine 1.34 Labs (5/22): LDL 42 Labs (8/22): K 3.8, creatinine 1.45 Labs (1/24): K 4, creatinine 2.3 Labs (2/24): K 5.1, creatinine 2.23, LDL 48 Labs (10/24): K 4.3, creatinine 2.61  PMH: 1. Atrial fibrillation: Paroxysmal.  Diagnosed 4/14, DCCV 4/14 to NSR.  DCCV 1/16 to NSR.  DCCV 6/16 to NSR.  2. Chronic systolic CHF: Nonischemic cardiomyopathy.  LHC in 1998 with nonobstructive disease.  Cardiolite in 6/14 with no ischemia or infarction.  cMRI (5/16) with EF 34%, mildly dilated LV with diffuse HK worse in anterolateral wall, small punctate areas of LGE in anteroseptum and basal inferior wall (not CAD pattern).  TEE (6/16) with EF 15-20%.  He has Medtronic CRT-D device.  Echo (9/16) with EF 40%, moderate LV dilation, grade II diastolic dysfunction, normal RV size and systolic function, PASP 42 mmHg.  - Hypotension with Entresto.  - Echo (2/17) showed LV  Patient ID: Chad Brown, male   DOB: [birth year], 82 y.o.   MRN: [id number] PCP: Cleatis Polka., MD HF Cardiologist: Shirlee Latch  82 y.o. with paroxysmal atrial fibrillation and chronic systolic CHF thought to be due to nonischemic cardiomyopathy presents for followup of CHF. He has a cardiac history dating back to 1998, when he was admitted with chest pain and had cardiac cath showing nonobstructive CAD. In 4/14, he was found to be in atrial fibrillation.  Echo showed EF 30-35%.  He had DCCV back to NSR.  He was back in atrial fibrillation in 1/16, and EF was low again on TEE at that time.   Again, he had DCCV.  In 5/16, cardiac MRI showed persistently low EF, so given LBBB, he had Medtronic CRT-D device placed.  He was back in atrial fibrillation in 6/16 and had TEE-guided DCCV again with EF now 15-20% on TEE.  Repeat echo in 2/17 showed EF 55-60%. Echo in 1/19 showed EF 45-50%, mild LV and RV dilation.   Echo in 1/20 showed EF 45-50%, diffuse hypokinesis.   Atrial fibrillation ablation was done in 2/21.  Echo in 2/21 showed EF 40-45%, PASP 50 mmHg, mild MR. Echo in 6/22 also showed EF 40-45%.   Patient was admitted in 1/24 after a syncopal episode.  He had gone to the gym and exercised and was standing talking to someone when he got lightheaded and passed out.  No arrhythmia on device interrogation.  He was hypotensive and orthostatic in the ER. Coreg and Entresto were stopped and Lasix was decreased to 20 mg bid.    Spironolactone was stopped due to hyperkalemia.   Echo was done today and reviewed, EF 40-45%, mild LV dilation, mildly decreased RV systolic function, moderate-severe MR with splay artifact and PISA ERO 0.44 cm^2, PASP 71 mmHg, dilated IVC.    Today he returns for HF follow up with his wife.  He has been more short of breath recently.  Weight is stable.  He is dyspneic with hills and stairs.  He is orthopneic, has to sleep on his side. No lightheadedness.  No chest pain. Lasix was  increased yesterday from 60 mg daily to 80 mg daily.   Medtronic device interrogation: Fluid index > threshold with decreased thoracic impedance, >99% BiV pacing.   ECG (personally reviewed): A-BiV pacing  Labs (10/19): K 4.7, creatinine 1.66 => 1.58, LFTs normal Labs (1/20): LDL 128 Labs (11/20): K 3.6, creatinine 1.62 Labs (1/21): K 3.8, creatinine 1.89, BNP 578, LDL 115, TSH normal, LFTs normal Labs (3/21): K 4.7, creatinine 2.12 Labs (1/22): K 3.3, creatinine 1.34 Labs (5/22): LDL 42 Labs (8/22): K 3.8, creatinine 1.45 Labs (1/24): K 4, creatinine 2.3 Labs (2/24): K 5.1, creatinine 2.23, LDL 48 Labs (10/24): K 4.3, creatinine 2.61  PMH: 1. Atrial fibrillation: Paroxysmal.  Diagnosed 4/14, DCCV 4/14 to NSR.  DCCV 1/16 to NSR.  DCCV 6/16 to NSR.  2. Chronic systolic CHF: Nonischemic cardiomyopathy.  LHC in 1998 with nonobstructive disease.  Cardiolite in 6/14 with no ischemia or infarction.  cMRI (5/16) with EF 34%, mildly dilated LV with diffuse HK worse in anterolateral wall, small punctate areas of LGE in anteroseptum and basal inferior wall (not CAD pattern).  TEE (6/16) with EF 15-20%.  He has Medtronic CRT-D device.  Echo (9/16) with EF 40%, moderate LV dilation, grade II diastolic dysfunction, normal RV size and systolic function, PASP 42 mmHg.  - Hypotension with Entresto.  - Echo (2/17) showed LV  Patient ID: Chad Brown, male   DOB: [birth year], 82 y.o.   MRN: [id number] PCP: Cleatis Polka., MD HF Cardiologist: Shirlee Latch  82 y.o. with paroxysmal atrial fibrillation and chronic systolic CHF thought to be due to nonischemic cardiomyopathy presents for followup of CHF. He has a cardiac history dating back to 1998, when he was admitted with chest pain and had cardiac cath showing nonobstructive CAD. In 4/14, he was found to be in atrial fibrillation.  Echo showed EF 30-35%.  He had DCCV back to NSR.  He was back in atrial fibrillation in 1/16, and EF was low again on TEE at that time.   Again, he had DCCV.  In 5/16, cardiac MRI showed persistently low EF, so given LBBB, he had Medtronic CRT-D device placed.  He was back in atrial fibrillation in 6/16 and had TEE-guided DCCV again with EF now 15-20% on TEE.  Repeat echo in 2/17 showed EF 55-60%. Echo in 1/19 showed EF 45-50%, mild LV and RV dilation.   Echo in 1/20 showed EF 45-50%, diffuse hypokinesis.   Atrial fibrillation ablation was done in 2/21.  Echo in 2/21 showed EF 40-45%, PASP 50 mmHg, mild MR. Echo in 6/22 also showed EF 40-45%.   Patient was admitted in 1/24 after a syncopal episode.  He had gone to the gym and exercised and was standing talking to someone when he got lightheaded and passed out.  No arrhythmia on device interrogation.  He was hypotensive and orthostatic in the ER. Coreg and Entresto were stopped and Lasix was decreased to 20 mg bid.    Spironolactone was stopped due to hyperkalemia.   Echo was done today and reviewed, EF 40-45%, mild LV dilation, mildly decreased RV systolic function, moderate-severe MR with splay artifact and PISA ERO 0.44 cm^2, PASP 71 mmHg, dilated IVC.    Today he returns for HF follow up with his wife.  He has been more short of breath recently.  Weight is stable.  He is dyspneic with hills and stairs.  He is orthopneic, has to sleep on his side. No lightheadedness.  No chest pain. Lasix was  increased yesterday from 60 mg daily to 80 mg daily.   Medtronic device interrogation: Fluid index > threshold with decreased thoracic impedance, >99% BiV pacing.   ECG (personally reviewed): A-BiV pacing  Labs (10/19): K 4.7, creatinine 1.66 => 1.58, LFTs normal Labs (1/20): LDL 128 Labs (11/20): K 3.6, creatinine 1.62 Labs (1/21): K 3.8, creatinine 1.89, BNP 578, LDL 115, TSH normal, LFTs normal Labs (3/21): K 4.7, creatinine 2.12 Labs (1/22): K 3.3, creatinine 1.34 Labs (5/22): LDL 42 Labs (8/22): K 3.8, creatinine 1.45 Labs (1/24): K 4, creatinine 2.3 Labs (2/24): K 5.1, creatinine 2.23, LDL 48 Labs (10/24): K 4.3, creatinine 2.61  PMH: 1. Atrial fibrillation: Paroxysmal.  Diagnosed 4/14, DCCV 4/14 to NSR.  DCCV 1/16 to NSR.  DCCV 6/16 to NSR.  2. Chronic systolic CHF: Nonischemic cardiomyopathy.  LHC in 1998 with nonobstructive disease.  Cardiolite in 6/14 with no ischemia or infarction.  cMRI (5/16) with EF 34%, mildly dilated LV with diffuse HK worse in anterolateral wall, small punctate areas of LGE in anteroseptum and basal inferior wall (not CAD pattern).  TEE (6/16) with EF 15-20%.  He has Medtronic CRT-D device.  Echo (9/16) with EF 40%, moderate LV dilation, grade II diastolic dysfunction, normal RV size and systolic function, PASP 42 mmHg.  - Hypotension with Entresto.  - Echo (2/17) showed LV

## 2023-07-17 NOTE — Progress Notes (Signed)
  Echocardiogram 2D Echocardiogram has been performed.  Chad Brown 07/17/2023, 10:56 AM

## 2023-07-19 ENCOUNTER — Ambulatory Visit: Payer: Medicare PPO | Admitting: Acute Care

## 2023-07-19 ENCOUNTER — Encounter: Payer: Self-pay | Admitting: Acute Care

## 2023-07-19 VITALS — BP 102/60 | HR 81 | Resp 16 | Ht 66.0 in | Wt 215.2 lb

## 2023-07-19 DIAGNOSIS — Z87891 Personal history of nicotine dependence: Secondary | ICD-10-CM

## 2023-07-19 DIAGNOSIS — J069 Acute upper respiratory infection, unspecified: Secondary | ICD-10-CM | POA: Diagnosis not present

## 2023-07-19 DIAGNOSIS — R918 Other nonspecific abnormal finding of lung field: Secondary | ICD-10-CM | POA: Diagnosis not present

## 2023-07-19 DIAGNOSIS — Z23 Encounter for immunization: Secondary | ICD-10-CM

## 2023-07-19 MED ORDER — AMOXICILLIN-POT CLAVULANATE 875-125 MG PO TABS
1.0000 | ORAL_TABLET | Freq: Two times a day (BID) | ORAL | 0 refills | Status: AC
Start: 2023-07-19 — End: 2023-07-26

## 2023-07-19 NOTE — Progress Notes (Unsigned)
History of Present Illness Chad Brown is a 82 y.o. male with ***   Synopsis PMH Former smoker, 40+ years, quit in 2001, CT scan with multiple pulmonary nodules evaluated by PET scan.  Patient has a past medical history of stage II melanoma of the lower leg status post resection in 2015, chronic systolic heart failure, AICD placement, history of hypertension hyperlipidemia.  Patient initially seen by Dr. Marchelle Gearing for evaluation of multiple pulmonary nodules.  There was initial mentioning of potential metastatic disease with history of melanoma.  Patient underwent nuclear medicine pet imaging which revealed low-level uptake within the upper lobe nodules.  He  also has a calcified lesion within the pancreas and a new thyroid nodule within the right thyroid gland was found that did have PET uptake. He also  diagnosed 07/2022 with papillary thyroid cancer status post thyroidectomy. Recent nuclear medicine thyroid scan with no evidence of metastatic disease.     07/19/2023 Pt. Presents for follow up to review CT Chest . CT Chest does show some ground glass opacities in both lungs which are infectious or inflammatory. He does endorse a new cough with secretions that are clear to green.   Test Results: CT Chest  07/02/2023 New diffuse patchy airspace and ground-glass opacities in both lungs which are likely inflammatory/infectious. Recommend clinical correlation and radiographic follow-up. 2. No dominant lung mass identified. Previously demonstrated small pulmonary nodules are grossly unchanged but largely obscured by the superimpose opacities. 3. Mildly prominent mediastinal and right hilar lymph nodes, likely reactive. 4. Stable chronic central enlargement of the pulmonary arteries consistent with chronic pulmonary arterial hypertension. 5. Stable chronic large posterior osteophyte at T2-3 which narrows the AP diameter of the canal to 4 mm and likely contributes to chronic cord  compression. Underlying extensive changes of diffuse idiopathic skeletal hyperostosis. 6. Aortic Atherosclerosis (ICD10-I70.0) and Emphysema (ICD10-J43.9).   07/10/2021: CT scan of the chest: Here today for follow-up for pulmonary nodules.  Still has a few scattered small subcentimeter areas of groundglass.  Previous lung nodules that we were following some have regressed completely.  Still has a small nodule in the right upper lobe associated centrilobular and paraseptal emphysema consistent with his COPD. The patient's images have been independently reviewed by me.     September 2023 CT chest: Stable pulmonary nodules. The patient's images have been independently reviewed by me.     PFT 12/28/2020 FVC-Pre L 2.58  2.62   FVC-Predicted Pre % 80  78   FVC-Post L 2.78  2.89   FVC-Predicted Post % 86  86   Pre FEV1/FVC % % 74  71   Post FEV1/FCV % % 76  76   FEV1-Pre L 1.92  1.86   FEV1-Predicted Pre % 85  78   FEV1-Post L 2.11  2.19   DLCO uncorrected ml/min/mmHg 14.47  16.65   DLCO UNC% % 69  78   DLCO corrected ml/min/mmHg 14.47  18.73   DLCO COR %Predicted % 69  88   DLVA Predicted % 77  96   TLC L 5.21  6.02   TLC % Predicted % 86  99   RV % Predicted % 110  139          Latest Ref Rng & Units 07/17/2023   12:27 PM 04/03/2023    9:25 AM 10/11/2022    4:38 AM  CBC  WBC 4.0 - 10.5 K/uL 5.4  6.8  7.9   Hemoglobin 13.0 - 17.0 g/dL 11.4  12.0  10.4   Hematocrit 39.0 - 52.0 % 36.2  36.8  31.5   Platelets 150 - 400 K/uL 158  167  137        Latest Ref Rng & Units 07/17/2023   12:27 PM 07/01/2023   10:16 AM 06/21/2023    9:50 AM  BMP  Glucose 70 - 99 mg/dL 829  562  130   BUN 8 - 23 mg/dL 28  34  41   Creatinine 0.61 - 1.24 mg/dL 8.65  7.84  6.96   Sodium 135 - 145 mmol/L 143  139  138   Potassium 3.5 - 5.1 mmol/L 4.7  4.3  5.1   Chloride 98 - 111 mmol/L 105  102  101   CO2 22 - 32 mmol/L 28  29  27    Calcium 8.9 - 10.3 mg/dL 9.6  8.3  8.8     BNP    Component Value  Date/Time   BNP 410.5 (H) 07/17/2023 1227    ProBNP    Component Value Date/Time   PROBNP 385.0 (H) 10/29/2014 0953    PFT    Component Value Date/Time   FEV1PRE 1.92 12/28/2020 1055   FEV1POST 2.11 12/28/2020 1055   FVCPRE 2.58 12/28/2020 1055   FVCPOST 2.78 12/28/2020 1055   TLC 5.21 12/28/2020 1055   DLCOUNC 14.47 12/28/2020 1055   PREFEV1FVCRT 74 12/28/2020 1055   PSTFEV1FVCRT 76 12/28/2020 1055    CT Chest Wo Contrast  Result Date: 07/18/2023 CLINICAL DATA:  Follow up pulmonary nodules. History of melanoma. * Tracking Code: BO * EXAM: CT CHEST WITHOUT CONTRAST TECHNIQUE: Multidetector CT imaging of the chest was performed following the standard protocol without IV contrast. RADIATION DOSE REDUCTION: This exam was performed according to the departmental dose-optimization program which includes automated exposure control, adjustment of the mA and/or kV according to patient size and/or use of iterative reconstruction technique. COMPARISON:  Chest CT 06/11/2022 and 06/22/2021. Portable chest radiograph 10/10/2022. FINDINGS: Cardiovascular: Atherosclerosis of the aorta, great vessels and coronary arteries. Left subclavian AICD leads project into the right atrium, right ventricle and coronary sinus. Stable chronic central enlargement of the pulmonary arteries and mild cardiomegaly. No significant pericardial effusion. The heart size is normal. There is no pericardial effusion. Mediastinum/Nodes: There are mildly prominent mediastinal and right hilar lymph nodes, likely reactive. Probable previous thyroidectomy. The esophagus appears unremarkable. Lungs/Pleura: Trace right pleural effusion. Evidence of mild underlying centrilobular emphysema with diffuse central airway thickening. There are new diffuse patchy airspace and ground-glass opacities in both lungs which are likely inflammatory/infectious. These partially obscure many of the previously demonstrated small pulmonary nodules  bilaterally which are grossly unchanged. No dominant lung mass identified. Upper abdomen: No acute findings are seen in the visualized upper abdomen. There is a small calcified gallstone. Right renal cortical scarring and a 5.9 cm left renal cyst are incompletely visualized, although grossly unchanged; no specific follow-up imaging recommended. Musculoskeletal/Chest wall: Extensive partially ankylosing osteophytes are noted throughout the thoracic spine consistent with diffuse idiopathic skeletal hyperostosis. Patient has undergone previous multilevel posterior decompression. There is a stable chronic large posterior osteophyte at T2-3 which narrows the AP diameter of the canal to 4 mm and likely contributes to chronic cord compression. This appears unchanged from previous CT and was covered on more recent cervical spine CT performed 10/10/2022. No acute osseous findings are evident. Unless specific follow-up recommendations are mentioned in the findings or impression sections, no imaging follow-up of any mentioned incidental findings is recommended.  IMPRESSION: 1. New diffuse patchy airspace and ground-glass opacities in both lungs which are likely inflammatory/infectious. Recommend clinical correlation and radiographic follow-up. 2. No dominant lung mass identified. Previously demonstrated small pulmonary nodules are grossly unchanged but largely obscured by the superimpose opacities. 3. Mildly prominent mediastinal and right hilar lymph nodes, likely reactive. 4. Stable chronic central enlargement of the pulmonary arteries consistent with chronic pulmonary arterial hypertension. 5. Stable chronic large posterior osteophyte at T2-3 which narrows the AP diameter of the canal to 4 mm and likely contributes to chronic cord compression. Underlying extensive changes of diffuse idiopathic skeletal hyperostosis. 6. Aortic Atherosclerosis (ICD10-I70.0) and Emphysema (ICD10-J43.9). Electronically Signed   By: Carey Bullocks M.D.   On: 07/18/2023 10:28   ECHOCARDIOGRAM COMPLETE  Result Date: 07/17/2023    ECHOCARDIOGRAM REPORT   Patient Name:   Chad Brown Date of Exam: 07/17/2023 Medical Rec #:  [id number]        Height:       66.0 in Accession #:    [id number]       Weight:       213.8 lb Date of Birth:  1941-03-24         BSA:          2.057 m Patient Age:    82 years         BP:           133/88 mmHg Patient Gender: M                HR:           78 bpm. Exam Location:  Outpatient Procedure: 2D Echo, 3D Echo, Cardiac Doppler, Color Doppler and Strain Analysis Indications:    I50.40* Unspecified combined systolic (congestive) and diastolic                 (congestive) heart failure  History:        Patient has prior history of Echocardiogram examinations, most                 recent 04/03/2023. CHF and Cardiomyopathy, Previous Myocardial                 Infarction and CAD, Abnormal ECG, Defibrillator and Pacemaker,                 Arrythmias:Atrial Fibrillation and Bradycardia; Risk                 Factors:Hypertension, Dyslipidemia and Sleep Apnea.  Sonographer:    Sheralyn Boatman RDCS Referring Phys: [phone] JESSICA M [place] IMPRESSIONS  1. Left ventricular ejection fraction, by estimation, is 40 to 45%. The left ventricle has mildly decreased function. The left ventricle demonstrates global hypokinesis. The left ventricular internal cavity size was mildly dilated. There is mild concentric left ventricular hypertrophy. Left ventricular diastolic parameters are consistent with Grade II diastolic dysfunction (pseudonormalization).  2. Right ventricular systolic function is mildly reduced. The right ventricular size is normal. There is severely elevated pulmonary artery systolic pressure. The estimated right ventricular systolic pressure is 71.0 mmHg.  3. Left atrial size was mildly dilated.  4. Right atrial size was mildly dilated.  5. The mitral valve is abnormal. Moderate to severe mitral valve regurgitation. Mitral  regurgitation may not be fully visualized. There is splay artifact noted, and ERO by PISA is 0.41 cm^2. No evidence of mitral stenosis.  6. The aortic valve is tricuspid. There is mild calcification of the aortic valve. Aortic valve  regurgitation is trivial. No aortic stenosis is present.  7. The inferior vena cava is dilated in size with >50% respiratory variability, suggesting right atrial pressure of 8 mmHg. FINDINGS  Left Ventricle: Left ventricular ejection fraction, by estimation, is 40 to 45%. The left ventricle has mildly decreased function. The left ventricle demonstrates global hypokinesis. The left ventricular internal cavity size was mildly dilated. There is  mild concentric left ventricular hypertrophy. Left ventricular diastolic parameters are consistent with Grade II diastolic dysfunction (pseudonormalization). Right Ventricle: The right ventricular size is normal. No increase in right ventricular wall thickness. Right ventricular systolic function is mildly reduced. There is severely elevated pulmonary artery systolic pressure. The tricuspid regurgitant velocity is 3.97 m/s, and with an assumed right atrial pressure of 8 mmHg, the estimated right ventricular systolic pressure is 71.0 mmHg. Left Atrium: Left atrial size was mildly dilated. Right Atrium: Right atrial size was mildly dilated. Pericardium: There is no evidence of pericardial effusion. Mitral Valve: The mitral valve is abnormal. Mild mitral annular calcification. Moderate to severe mitral valve regurgitation. No evidence of mitral valve stenosis. MV peak gradient, 5.5 mmHg. The mean mitral valve gradient is 2.0 mmHg. Tricuspid Valve: The tricuspid valve is normal in structure. Tricuspid valve regurgitation is mild. Aortic Valve: The aortic valve is tricuspid. There is mild calcification of the aortic valve. Aortic valve regurgitation is trivial. No aortic stenosis is present. Pulmonic Valve: The pulmonic valve was normal in structure.  Pulmonic valve regurgitation is trivial. Aorta: The aortic root is normal in size and structure. Venous: The inferior vena cava is dilated in size with greater than 50% respiratory variability, suggesting right atrial pressure of 8 mmHg. IAS/Shunts: No atrial level shunt detected by color flow Doppler. Additional Comments: A device lead is visualized in the right ventricle.  LEFT VENTRICLE PLAX 2D LVIDd:         5.80 cm      Diastology LVIDs:         4.60 cm      LV e' medial:    4.90 cm/s LV PW:         1.20 cm      LV E/e' medial:  21.6 LV IVS:        1.40 cm      LV e' lateral:   8.92 cm/s LVOT diam:     2.00 cm      LV E/e' lateral: 11.9 LV SV:         59 LV SV Index:   29 LVOT Area:     3.14 cm                              3D Volume EF: LV Volumes (MOD)            3D EF:        43 % LV vol d, MOD A2C: 100.0 ml LV EDV:       155 ml LV vol d, MOD A4C: 108.0 ml LV ESV:       88 ml LV vol s, MOD A2C: 61.2 ml  LV SV:        67 ml LV vol s, MOD A4C: 65.1 ml LV SV MOD A2C:     38.8 ml LV SV MOD A4C:     108.0 ml LV SV MOD BP:      42.4 ml IVC IVC diam: 2.20 cm LEFT ATRIUM  Index        RIGHT ATRIUM           Index LA diam:        5.50 cm 2.67 cm/m   RA Area:     24.50 cm LA Vol (A2C):   81.7 ml 39.71 ml/m  RA Volume:   82.40 ml  40.05 ml/m LA Vol (A4C):   57.8 ml 28.09 ml/m LA Biplane Vol: 68.5 ml 33.30 ml/m  AORTIC VALVE             PULMONIC VALVE LVOT Vmax:   97.30 cm/s  PR End Diast Vel: 1.99 msec LVOT Vmean:  61.600 cm/s LVOT VTI:    0.188 m  AORTA Ao Root diam: 3.60 cm Ao Asc diam:  3.60 cm MITRAL VALVE                  TRICUSPID VALVE MV Area (PHT): 3.99 cm       TR Peak grad:   63.0 mmHg MV Area VTI:   1.60 cm       TR Vmax:        397.00 cm/s MV Peak grad:  5.5 mmHg MV Mean grad:  2.0 mmHg       SHUNTS MV Vmax:       1.17 m/s       Systemic VTI:  0.19 m MV Vmean:      68.1 cm/s      Systemic Diam: 2.00 cm MV Decel Time: 190 msec MR Peak grad:    75.0 mmHg MR Mean grad:    50.0 mmHg MR Vmax:          433.00 cm/s MR Vmean:        336.0 cm/s MR PISA:         3.53 cm MR PISA Eff ROA: 29 mm MR PISA Radius:  0.75 cm MV E velocity: 106.00 cm/s Chad McleanMD Electronically signed by Wilfred Lacy Signature Date/Time: 07/17/2023/11:34:09 AM    Final    CUP PACEART REMOTE DEVICE CHECK  Result Date: 07/01/2023 Monthly battery check.  Known RRT 9/3, clinic and pt aware Normal device function. HF diagnostics currently abnormal Follow up as scheduled monthly LA, CVRS    Past medical hx Past Medical History:  Diagnosis Date   Adenomatous colon polyp    tubular   AICD (automatic cardioverter/defibrillator) present 03/08/2015   MDT CRTD dual pacemaker and defib   Anemia    iron deficient   Arthritis    "about all my joints; hands, knees, back" (03/08/2015)   Atherosclerosis    Cataract    left eye small   Cholelithiasis    gallstones   Chronic systolic CHF (congestive heart failure) (HCC)    a. New dx 12/2012 ? NICM, may be r/t afib. b. Nuc 03/2013 - normal;  c. 03/2015 TEE EF 15-20%.   Colon polyp, hyperplastic 01/2015   removed precancerous lesions   Depression    Diverticulosis    Dysrhythmia    afib   GERD (gastroesophageal reflux disease)    Glaucoma    right eye   Hyperlipidemia    Hypertension    Melanoma of eye (HCC) 2000's   "right; it's never been biopsied"   Melanoma of lower leg (HCC) 2015   "left; right at my knee"   Myocardial infarction (HCC) 1998   OSA (obstructive sleep apnea) 01/04/2016   no longer tolerates cpap   Peripheral vision loss, right 2006   Persistent atrial  fibrillation (HCC)    a. Dx 12/2012, s/p TEE/DCCV 01/26/13. b. On Xarelto (CHA2DS2VASc = 3);  c. 03/2015 TEE (EF 15-20%, no LAA thrombus) and DCCV - amio increased to 200 mg bid.   Pneumonia    Urinary hesitancy due to benign prostatic hypertrophy      Social History   Tobacco Use   Smoking status: Former    Current packs/day: 0.00    Average packs/day: 3.0 packs/day for 48.0 years  (144.0 ttl pk-yrs)    Types: Cigarettes    Start date: 09/28/1952    Quit date: 09/28/2000    Years since quitting: 22.8   Smokeless tobacco: Never  Vaping Use   Vaping status: Never Used  Substance Use Topics   Alcohol use: No   Drug use: No    Mr.Zinni reports that he quit smoking about 22 years ago. His smoking use included cigarettes. He started smoking about 70 years ago. He has a 144 pack-year smoking history. He has never used smokeless tobacco. He reports that he does not drink alcohol and does not use drugs.  Tobacco Cessation: Counseling given: Not Answered   Past surgical hx, Family hx, Social hx all reviewed.  Current Outpatient Medications on File Prior to Visit  Medication Sig   amiodarone (PACERONE) 200 MG tablet Take 1 tablet (200 mg total) by mouth daily.   calcium carbonate (TUMS - DOSED IN MG ELEMENTAL CALCIUM) 500 MG chewable tablet Chew 2 tablets by mouth 2 (two) times daily.   carvedilol (COREG) 3.125 MG tablet Take 1 tablet (3.125 mg total) by mouth 2 (two) times daily.   cholecalciferol (VITAMIN D3) 25 MCG (1000 UNIT) tablet Take 1,000 Units by mouth daily.   Cyanocobalamin (VITAMIN B 12 PO) Take 1 tablet by mouth daily.   dorzolamide-timolol (COSOPT) 22.3-6.8 MG/ML ophthalmic solution Place 1 drop into the right eye 2 (two) times daily.   empagliflozin (JARDIANCE) 10 MG TABS tablet Take 1 tablet (10 mg total) by mouth daily.   escitalopram (LEXAPRO) 20 MG tablet Take 1 tablet (20 mg total) by mouth daily.   Evolocumab (REPATHA SURECLICK) 140 MG/ML SOAJ INJECT 140MG  ( ) INTO SKIN EVERY 2 WEEKS   ferrous sulfate 325 (65 FE) MG tablet Take 325 mg by mouth daily with breakfast.   finasteride (PROSCAR) 5 MG tablet Take 1 tablet (5 mg total) by mouth daily.   FOLIC ACID PO Take 1 tablet by mouth daily.   furosemide (LASIX) 20 MG tablet Take 80 mg (4 tabs) every AM and take 40 mg (2 tabs) every other day in PM   gabapentin (NEURONTIN) 100 MG capsule Take 200  mg by mouth 2 (two) times daily.    latanoprost (XALATAN) 0.005 % ophthalmic solution Place 1 drop into the right eye at bedtime.   levothyroxine (SYNTHROID) 137 MCG tablet TAKE ONE TABLET DAILY BEFORE BREAKFAST. must make appointment FOR refills   nitroGLYCERIN (NITROSTAT) 0.4 MG SL tablet Place 0.4 mg under the tongue every 5 (five) minutes as needed for chest pain.   Polyethyl Glycol-Propyl Glycol 0.4-0.3 % SOLN Place 1 drop into the left eye in the morning and at bedtime.   RHOPRESSA 0.02 % SOLN Place 1 drop into the right eye at bedtime.   sodium zirconium cyclosilicate (LOKELMA) 10 g PACK packet Take as directed by the HF Clinic   tamsulosin (FLOMAX) 0.4 MG CAPS Take 0.4 mg by mouth at bedtime.    XARELTO 15 MG TABS tablet TAKE (1) TABLET DAILY WITH SUPPER.  No current facility-administered medications on file prior to visit.     Allergies  Allergen Reactions   Pravastatin Sodium Other (See Comments)    Joint and muscle pain   Azithromycin Diarrhea    Review Of Systems:  Constitutional:   No  weight loss, night sweats,  Fevers, chills, fatigue, or  lassitude.  HEENT:   No headaches,  Difficulty swallowing,  Tooth/dental problems, or  Sore throat,                No sneezing, itching, ear ache, nasal congestion, post nasal drip,   CV:  No chest pain,  Orthopnea, PND, swelling in lower extremities, anasarca, dizziness, palpitations, syncope.   GI  No heartburn, indigestion, abdominal pain, nausea, vomiting, diarrhea, change in bowel habits, loss of appetite, bloody stools.   Resp: No shortness of breath with exertion or at rest.  No excess mucus, no productive cough,  No non-productive cough,  No coughing up of blood.  No change in color of mucus.  No wheezing.  No chest wall deformity  Skin: no rash or lesions.  GU: no dysuria, change in color of urine, no urgency or frequency.  No flank pain, no hematuria   MS:  No joint pain or swelling.  No decreased range of motion.  No  back pain.  Psych:  No change in mood or affect. No depression or anxiety.  No memory loss.   Vital Signs BP 102/60   Pulse 81   Resp 16   Ht 5\' 6"  (1.676 m)   Wt 215 lb 3.2 oz (97.6 kg)   SpO2 96%   BMI 34.73 kg/m    Physical Exam:  General- No distress,  A&Ox3 ENT: No sinus tenderness, TM clear, pale nasal mucosa, no oral exudate,no post nasal drip, no LAN Cardiac: S1, S2, regular rate and rhythm, no murmur Chest: No wheeze/ rales/ dullness; no accessory muscle use, no nasal flaring, no sternal retractions Abd.: Soft Non-tender Ext: No clubbing cyanosis, edema Neuro:  normal strength Skin: No rashes, warm and dry Psych: normal mood and behavior   Assessment/Plan  No problem-specific Assessment & Plan notes found for this encounter.    Bevelyn Ngo, NP 07/19/2023  9:40 AM

## 2023-07-19 NOTE — Patient Instructions (Addendum)
It is good to see you today. Your CT Chest does show some areas that look like infection/ inflammation. I have sent in a prescription for Augmentin one tablet twice daily. Please take Probiotic daily with antibiotic. Do this for a total of 11 -12 days, starting today. Call if you have any difficulties taking the antibiotic. Follow up in 1 week to ensure you are doing better.  We will do a follow up CT Chest without contrast in 6 weeks to better evaluate the areas of your lung not seen well due to infection.  This will be due December 2024. You will get a call to schedule this . Call if you get worse not better.  Please contact office for sooner follow up if symptoms do not improve or worsen or seek emergency care

## 2023-07-22 ENCOUNTER — Encounter: Payer: Self-pay | Admitting: Acute Care

## 2023-07-23 ENCOUNTER — Telehealth: Payer: Self-pay

## 2023-07-23 NOTE — Progress Notes (Signed)
Appt with SG to review CT chest on 10/25  Thanks,  BLI  Josephine Igo, DO [facility] 07/23/2023 3:30 PM

## 2023-07-23 NOTE — Telephone Encounter (Signed)
Pt is scheduled for BiV ICD Gen Change with Dr. Elberta Fortis on 11/27 at 2:30 PM. He is aware that we will contact him after his RHC on 11/4 to go over medications and procedure instructions.

## 2023-07-24 ENCOUNTER — Other Ambulatory Visit (HOSPITAL_COMMUNITY)
Admission: RE | Admit: 2023-07-24 | Discharge: 2023-07-24 | Disposition: A | Discharge status: Home / Self Care | Payer: Medicare PPO | Source: Ambulatory Visit | Attending: Cardiology | Admitting: Cardiology

## 2023-07-24 DIAGNOSIS — I5022 Chronic systolic (congestive) heart failure: Secondary | ICD-10-CM | POA: Diagnosis not present

## 2023-07-24 LAB — BASIC METABOLIC PANEL
Anion gap: 11 (ref 5–15)
BUN: 38 mg/dL — ABNORMAL HIGH (ref 8–23)
CO2: 27 mmol/L (ref 22–32)
Calcium: 8 mg/dL — ABNORMAL LOW (ref 8.9–10.3)
Chloride: 101 mmol/L (ref 98–111)
Creatinine, Ser: 2.63 mg/dL — ABNORMAL HIGH (ref 0.61–1.24)
GFR, Estimated: 24 mL/min — ABNORMAL LOW (ref >60)
Glucose, Bld: 132 mg/dL — ABNORMAL HIGH (ref 70–99)
Potassium: 4.2 mmol/L (ref 3.5–5.1)
Sodium: 139 mmol/L (ref 135–145)

## 2023-07-26 ENCOUNTER — Ambulatory Visit: Payer: Medicare PPO | Admitting: Acute Care

## 2023-07-26 ENCOUNTER — Encounter: Payer: Self-pay | Admitting: Acute Care

## 2023-07-26 VITALS — BP 112/66 | HR 81 | Temp 97.6°F | Ht 66.0 in | Wt 213.0 lb

## 2023-07-26 DIAGNOSIS — G4733 Obstructive sleep apnea (adult) (pediatric): Secondary | ICD-10-CM | POA: Diagnosis not present

## 2023-07-26 DIAGNOSIS — J181 Lobar pneumonia, unspecified organism: Secondary | ICD-10-CM

## 2023-07-26 DIAGNOSIS — J449 Chronic obstructive pulmonary disease, unspecified: Secondary | ICD-10-CM | POA: Diagnosis not present

## 2023-07-26 DIAGNOSIS — R918 Other nonspecific abnormal finding of lung field: Secondary | ICD-10-CM | POA: Diagnosis not present

## 2023-07-26 NOTE — Progress Notes (Signed)
History of Present Illness Chad Brown is a 82 y.o. 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 He is followed by Dr. Marchelle Gearing , and was referred to Dr. Tonia Brooms for evaluation of lung nodules.    Synopsis PMH Former smoker, 40+ years, quit in 2001, CT scan with multiple pulmonary nodules evaluated by PET scan.  Patient has a past medical history of stage II melanoma of the lower leg status post resection in 2015, chronic systolic heart failure, AICD placement, history of hypertension hyperlipidemia.  Patient initially seen by Dr. Marchelle Gearing for evaluation of multiple pulmonary nodules.  There was initial mentioning of potential metastatic disease with history of melanoma.  Patient underwent nuclear medicine pet imaging which revealed low-level uptake within the upper lobe nodules.  He  also has a calcified lesion within the pancreas and a new thyroid nodule within the right thyroid gland was found that did have PET uptake. He also was  diagnosed 07/2022 with papillary thyroid cancer status post thyroidectomy. Recent nuclear medicine thyroid scan with no evidence of metastatic disease.   Pt was seen in the office after surveillance Ct Chest 07/19/2023. The CT showed New diffuse patchy airspace and ground-glass opacities in both lungs which are likely inflammatory/infectious. I treated him with Augmentin x 1 week. He is here for follow up to ensure he is getting better after treatment.    07/26/2023 Pt. Presents for follow up. He states he has been feeling better. No significant productive cough, no fever, and his dyspnea has improved. Today is his last day of antibiotics. He has had no issues with the Augmentin. He was compliant with his probiotic throughout the duration of therapy. Plan is for a 09/04/2023 repeat Ct Chest  to better re-evaluate the nodules of concern that were not visible on the 07/02/2023 scan due to infiltrates. Both patient and his wife are in agreement with this plan.   Test Results: CT Chest  07/02/2023 New diffuse patchy airspace and ground-glass opacities in both lungs which are likely inflammatory/infectious. Recommend clinical correlation and radiographic follow-up. 2. No dominant lung mass identified. Previously demonstrated small pulmonary nodules are grossly unchanged but largely obscured by the superimpose opacities. 3. Mildly prominent mediastinal and right hilar lymph nodes, likely reactive. 4. Stable chronic central enlargement of the pulmonary arteries consistent with chronic pulmonary arterial hypertension. 5. Stable chronic large posterior osteophyte at T2-3 which narrows the AP diameter of the canal to 4 mm and likely contributes to chronic cord compression. Underlying extensive changes of diffuse idiopathic skeletal hyperostosis. 6. Aortic Atherosclerosis (ICD10-I70.0) and Emphysema (ICD10-J43.9).     September 2023 CT chest: Stable pulmonary nodules.       PFT 12/28/2020 FVC-Pre L 2.58  2.62   FVC-Predicted Pre % 80  78   FVC-Post L 2.78  2.89   FVC-Predicted Post % 86  86   Pre FEV1/FVC % % 74  71   Post FEV1/FCV % % 76  76   FEV1-Pre L 1.92  1.86   FEV1-Predicted Pre % 85  78   FEV1-Post L 2.11  2.19   DLCO uncorrected ml/min/mmHg 14.47  16.65   DLCO UNC% % 69  78   DLCO corrected ml/min/mmHg 14.47  18.73   DLCO COR %Predicted % 69  88   DLVA Predicted % 77  96   TLC L  5.21  6.02   TLC % Predicted % 86  99   RV % Predicted % 110  139         Latest Ref Rng & Units 07/17/2023   12:27 PM 04/03/2023    9:25 AM 10/11/2022    4:38 AM  CBC  WBC 4.0 - 10.5 K/uL 5.4  6.8  7.9   Hemoglobin 13.0 - 17.0 g/dL 24.4  01.0  27.2   Hematocrit 39.0 - 52.0 % 36.2  36.8  31.5   Platelets 150 - 400 K/uL 158  167  137        Latest Ref Rng & Units 07/24/2023     9:29 AM 07/17/2023   12:27 PM 07/01/2023   10:16 AM  BMP  Glucose 70 - 99 mg/dL 536  644  034   BUN 8 - 23 mg/dL 38  28  34   Creatinine 0.61 - 1.24 mg/dL 7.42  5.95  6.38   Sodium 135 - 145 mmol/L 139  143  139   Potassium 3.5 - 5.1 mmol/L 4.2  4.7  4.3   Chloride 98 - 111 mmol/L 101  105  102   CO2 22 - 32 mmol/L 27  28  29    Calcium 8.9 - 10.3 mg/dL 8.0  9.6  8.3     BNP    Component Value Date/Time   BNP 410.5 (H) 07/17/2023 1227    ProBNP    Component Value Date/Time   PROBNP 385.0 (H) 10/29/2014 0953    PFT    Component Value Date/Time   FEV1PRE 1.92 12/28/2020 1055   FEV1POST 2.11 12/28/2020 1055   FVCPRE 2.58 12/28/2020 1055   FVCPOST 2.78 12/28/2020 1055   TLC 5.21 12/28/2020 1055   DLCOUNC 14.47 12/28/2020 1055   PREFEV1FVCRT 74 12/28/2020 1055   PSTFEV1FVCRT 76 12/28/2020 1055    CT Chest Wo Contrast  Result Date: 07/18/2023 CLINICAL DATA:  Follow up pulmonary nodules. History of melanoma. * Tracking Code: BO * EXAM: CT CHEST WITHOUT CONTRAST TECHNIQUE: Multidetector CT imaging of the chest was performed following the standard protocol without IV contrast. RADIATION DOSE REDUCTION: This exam was performed according to the departmental dose-optimization program which includes automated exposure control, adjustment of the mA and/or kV according to patient size and/or use of iterative reconstruction technique. COMPARISON:  Chest CT 06/11/2022 and 06/22/2021. Portable chest radiograph 10/10/2022. FINDINGS: Cardiovascular: Atherosclerosis of the aorta, great vessels and coronary arteries. Left subclavian AICD leads project into the right atrium, right ventricle and coronary sinus. Stable chronic central enlargement of the pulmonary arteries and mild cardiomegaly. No significant pericardial effusion. The heart size is normal. There is no pericardial effusion. Mediastinum/Nodes: There are mildly prominent mediastinal and right hilar lymph nodes, likely reactive. Probable  previous thyroidectomy. The esophagus appears unremarkable. Lungs/Pleura: Trace right pleural effusion. Evidence of mild underlying centrilobular emphysema with diffuse central airway thickening. There are new diffuse patchy airspace and ground-glass opacities in both lungs which are likely inflammatory/infectious. These partially obscure many of the previously demonstrated small pulmonary nodules bilaterally which are grossly unchanged. No dominant lung mass identified. Upper abdomen: No acute findings are seen in the visualized upper abdomen. There is a small calcified gallstone. Right renal cortical scarring and a 5.9 cm left renal cyst are incompletely visualized, although grossly unchanged; no specific follow-up imaging recommended. Musculoskeletal/Chest wall: Extensive partially ankylosing osteophytes are noted throughout the thoracic spine consistent with diffuse idiopathic skeletal hyperostosis. Patient has undergone previous multilevel posterior decompression.  There is a stable chronic large posterior osteophyte at T2-3 which narrows the AP diameter of the canal to 4 mm and likely contributes to chronic cord compression. This appears unchanged from previous CT and was covered on more recent cervical spine CT performed 10/10/2022. No acute osseous findings are evident. Unless specific follow-up recommendations are mentioned in the findings or impression sections, no imaging follow-up of any mentioned incidental findings is recommended. IMPRESSION: 1. New diffuse patchy airspace and ground-glass opacities in both lungs which are likely inflammatory/infectious. Recommend clinical correlation and radiographic follow-up. 2. No dominant lung mass identified. Previously demonstrated small pulmonary nodules are grossly unchanged but largely obscured by the superimpose opacities. 3. Mildly prominent mediastinal and right hilar lymph nodes, likely reactive. 4. Stable chronic central enlargement of the pulmonary  arteries consistent with chronic pulmonary arterial hypertension. 5. Stable chronic large posterior osteophyte at T2-3 which narrows the AP diameter of the canal to 4 mm and likely contributes to chronic cord compression. Underlying extensive changes of diffuse idiopathic skeletal hyperostosis. 6. Aortic Atherosclerosis (ICD10-I70.0) and Emphysema (ICD10-J43.9). Electronically Signed   By: Carey Bullocks M.D.   On: 07/18/2023 10:28   ECHOCARDIOGRAM COMPLETE  Result Date: 07/17/2023    ECHOCARDIOGRAM REPORT   Patient Name:   Chad Brown Date of Exam: 07/17/2023 Medical Rec #:  [id number]        Height:       66.0 in Accession #:    [id number]       Weight:       213.8 lb Date of Birth:  [birth year]         BSA:          2.057 m Patient Age:    82 years         BP:           133/88 mmHg Patient Gender: M                HR:           78 bpm. Exam Location:  Outpatient Procedure: 2D Echo, 3D Echo, Cardiac Doppler, Color Doppler and Strain Analysis Indications:    I50.40* Unspecified combined systolic (congestive) and diastolic                 (congestive) heart failure  History:        Patient has prior history of Echocardiogram examinations, most                 recent 04/03/2023. CHF and Cardiomyopathy, Previous Myocardial                 Infarction and CAD, Abnormal ECG, Defibrillator and Pacemaker,                 Arrythmias:Atrial Fibrillation and Bradycardia; Risk                 Factors:Hypertension, Dyslipidemia and Sleep Apnea.  Sonographer:    Sheralyn Boatman RDCS Referring Phys: [phone] JESSICA M [place] IMPRESSIONS  1. Left ventricular ejection fraction, by estimation, is 40 to 45%. The left ventricle has mildly decreased function. The left ventricle demonstrates global hypokinesis. The left ventricular internal cavity size was mildly dilated. There is mild concentric left ventricular hypertrophy. Left ventricular diastolic parameters are consistent with Grade II diastolic dysfunction (pseudonormalization).   2. Right ventricular systolic function is mildly reduced. The right ventricular size is normal. There is severely elevated pulmonary artery systolic pressure. The estimated right ventricular systolic pressure  is 71.0 mmHg.  3. Left atrial size was mildly dilated.  4. Right atrial size was mildly dilated.  5. The mitral valve is abnormal. Moderate to severe mitral valve regurgitation. Mitral regurgitation may not be fully visualized. There is splay artifact noted, and ERO by PISA is 0.41 cm^2. No evidence of mitral stenosis.  6. The aortic valve is tricuspid. There is mild calcification of the aortic valve. Aortic valve regurgitation is trivial. No aortic stenosis is present.  7. The inferior vena cava is dilated in size with >50% respiratory variability, suggesting right atrial pressure of 8 mmHg. FINDINGS  Left Ventricle: Left ventricular ejection fraction, by estimation, is 40 to 45%. The left ventricle has mildly decreased function. The left ventricle demonstrates global hypokinesis. The left ventricular internal cavity size was mildly dilated. There is  mild concentric left ventricular hypertrophy. Left ventricular diastolic parameters are consistent with Grade II diastolic dysfunction (pseudonormalization). Right Ventricle: The right ventricular size is normal. No increase in right ventricular wall thickness. Right ventricular systolic function is mildly reduced. There is severely elevated pulmonary artery systolic pressure. The tricuspid regurgitant velocity is 3.97 m/s, and with an assumed right atrial pressure of 8 mmHg, the estimated right ventricular systolic pressure is 71.0 mmHg. Left Atrium: Left atrial size was mildly dilated. Right Atrium: Right atrial size was mildly dilated. Pericardium: There is no evidence of pericardial effusion. Mitral Valve: The mitral valve is abnormal. Mild mitral annular calcification. Moderate to severe mitral valve regurgitation. No evidence of mitral valve stenosis. MV  peak gradient, 5.5 mmHg. The mean mitral valve gradient is 2.0 mmHg. Tricuspid Valve: The tricuspid valve is normal in structure. Tricuspid valve regurgitation is mild. Aortic Valve: The aortic valve is tricuspid. There is mild calcification of the aortic valve. Aortic valve regurgitation is trivial. No aortic stenosis is present. Pulmonic Valve: The pulmonic valve was normal in structure. Pulmonic valve regurgitation is trivial. Aorta: The aortic root is normal in size and structure. Venous: The inferior vena cava is dilated in size with greater than 50% respiratory variability, suggesting right atrial pressure of 8 mmHg. IAS/Shunts: No atrial level shunt detected by color flow Doppler. Additional Comments: A device lead is visualized in the right ventricle.  LEFT VENTRICLE PLAX 2D LVIDd:         5.80 cm      Diastology LVIDs:         4.60 cm      LV e' medial:    4.90 cm/s LV PW:         1.20 cm      LV E/e' medial:  21.6 LV IVS:        1.40 cm      LV e' lateral:   8.92 cm/s LVOT diam:     2.00 cm      LV E/e' lateral: 11.9 LV SV:         59 LV SV Index:   29 LVOT Area:     3.14 cm                              3D Volume EF: LV Volumes (MOD)            3D EF:        43 % LV vol d, MOD A2C: 100.0 ml LV EDV:       155 ml LV vol d, MOD A4C: 108.0 ml LV ESV:  88 ml LV vol s, MOD A2C: 61.2 ml  LV SV:        67 ml LV vol s, MOD A4C: 65.1 ml LV SV MOD A2C:     38.8 ml LV SV MOD A4C:     108.0 ml LV SV MOD BP:      42.4 ml IVC IVC diam: 2.20 cm LEFT ATRIUM             Index        RIGHT ATRIUM           Index LA diam:        5.50 cm 2.67 cm/m   RA Area:     24.50 cm LA Vol (A2C):   81.7 ml 39.71 ml/m  RA Volume:   82.40 ml  40.05 ml/m LA Vol (A4C):   57.8 ml 28.09 ml/m LA Biplane Vol: 68.5 ml 33.30 ml/m  AORTIC VALVE             PULMONIC VALVE LVOT Vmax:   97.30 cm/s  PR End Diast Vel: 1.99 msec LVOT Vmean:  61.600 cm/s LVOT VTI:    0.188 m  AORTA Ao Root diam: 3.60 cm Ao Asc diam:  3.60 cm MITRAL VALVE                   TRICUSPID VALVE MV Area (PHT): 3.99 cm       TR Peak grad:   63.0 mmHg MV Area VTI:   1.60 cm       TR Vmax:        397.00 cm/s MV Peak grad:  5.5 mmHg MV Mean grad:  2.0 mmHg       SHUNTS MV Vmax:       1.17 m/s       Systemic VTI:  0.19 m MV Vmean:      68.1 cm/s      Systemic Diam: 2.00 cm MV Decel Time: 190 msec MR Peak grad:    75.0 mmHg MR Mean grad:    50.0 mmHg MR Vmax:         433.00 cm/s MR Vmean:        336.0 cm/s MR PISA:         3.53 cm MR PISA Eff ROA: 29 mm MR PISA Radius:  0.75 cm MV E velocity: 106.00 cm/s Dalton McleanMD Electronically signed by Wilfred Lacy Signature Date/Time: 07/17/2023/11:34:09 AM    Final    CUP PACEART REMOTE DEVICE CHECK  Result Date: 07/01/2023 Monthly battery check.  Known RRT 9/3, clinic and pt aware Normal device function. HF diagnostics currently abnormal Follow up as scheduled monthly LA, CVRS    Past medical hx Past Medical History:  Diagnosis Date   Adenomatous colon polyp    tubular   AICD (automatic cardioverter/defibrillator) present 03/08/2015   MDT CRTD dual pacemaker and defib   Anemia    iron deficient   Arthritis    "about all my joints; hands, knees, back" (03/08/2015)   Atherosclerosis    Cataract    left eye small   Cholelithiasis    gallstones   Chronic systolic CHF (congestive heart failure) (HCC)    a. New dx 12/2012 ? NICM, may be r/t afib. b. Nuc 03/2013 - normal;  c. 03/2015 TEE EF 15-20%.   Colon polyp, hyperplastic 01/2015   removed precancerous lesions   Depression    Diverticulosis    Dysrhythmia    afib   GERD (gastroesophageal  reflux disease)    Glaucoma    right eye   Hyperlipidemia    Hypertension    Melanoma of eye (HCC) 2000's   "right; it's never been biopsied"   Melanoma of lower leg (HCC) 2015   "left; right at my knee"   Myocardial infarction (HCC) 1998   OSA (obstructive sleep apnea) 01/04/2016   no longer tolerates cpap   Peripheral vision loss, right 2006   Persistent atrial  fibrillation (HCC)    a. Dx 12/2012, s/p TEE/DCCV 01/26/13. b. On Xarelto (CHA2DS2VASc = 3);  c. 03/2015 TEE (EF 15-20%, no LAA thrombus) and DCCV - amio increased to 200 mg bid.   Pneumonia    Urinary hesitancy due to benign prostatic hypertrophy      Social History   Tobacco Use   Smoking status: Former    Current packs/day: 0.00    Average packs/day: 3.0 packs/day for 48.0 years (144.0 ttl pk-yrs)    Types: Cigarettes    Start date: 09/28/1952    Quit date: 09/28/2000    Years since quitting: 22.8   Smokeless tobacco: Never  Vaping Use   Vaping status: Never Used  Substance Use Topics   Alcohol use: No   Drug use: No    Mr.Sheperd reports that he quit smoking about 22 years ago. His smoking use included cigarettes. He started smoking about 70 years ago. He has a 144 pack-year smoking history. He has never used smokeless tobacco. He reports that he does not drink alcohol and does not use drugs.  Tobacco Cessation: Former smoker with a 144 pack year smoking history, quit 2002   Past surgical hx, Family hx, Social hx all reviewed.  Current Outpatient Medications on File Prior to Visit  Medication Sig   acetaminophen (TYLENOL) 500 MG tablet Take 500-1,000 mg by mouth every 6 (six) hours as needed.   amiodarone (PACERONE) 200 MG tablet Take 1 tablet (200 mg total) by mouth daily.   calcium carbonate (TUMS - DOSED IN MG ELEMENTAL CALCIUM) 500 MG chewable tablet Chew 2 tablets by mouth 2 (two) times daily.   carvedilol (COREG) 3.125 MG tablet Take 1 tablet (3.125 mg total) by mouth 2 (two) times daily.   cholecalciferol (VITAMIN D3) 25 MCG (1000 UNIT) tablet Take 1,000 Units by mouth in the morning.   Cyanocobalamin (VITAMIN B 12 PO) Take 500 mcg by mouth in the morning.   dorzolamide-timolol (COSOPT) 22.3-6.8 MG/ML ophthalmic solution Place 1 drop into the right eye 2 (two) times daily.   empagliflozin (JARDIANCE) 10 MG TABS tablet Take 1 tablet (10 mg total) by mouth daily.    escitalopram (LEXAPRO) 20 MG tablet Take 1 tablet (20 mg total) by mouth daily. (Patient taking differently: Take 20 mg by mouth daily in the afternoon.)   Evolocumab (REPATHA SURECLICK) 140 MG/ML SOAJ INJECT 140MG  ( ) INTO SKIN EVERY 2 WEEKS   Ferrous Sulfate (IRON PO) Take 9 mg by mouth in the morning. Chewable   finasteride (PROSCAR) 5 MG tablet Take 1 tablet (5 mg total) by mouth daily. (Patient taking differently: Take 5 mg by mouth every evening.)   FOLIC ACID PO Take 1 tablet by mouth daily.   furosemide (LASIX) 20 MG tablet Take 80 mg (4 tabs) every AM and take 40 mg (2 tabs) every other day in PM   gabapentin (NEURONTIN) 100 MG capsule Take 200 mg by mouth 2 (two) times daily.    latanoprost (XALATAN) 0.005 % ophthalmic solution Place 1 drop into the  right eye daily at 12 noon.   levothyroxine (SYNTHROID) 137 MCG tablet TAKE ONE TABLET DAILY BEFORE BREAKFAST. must make appointment FOR refills   nitroGLYCERIN (NITROSTAT) 0.4 MG SL tablet Place 0.4 mg under the tongue every 5 (five) minutes x 3 doses as needed for chest pain.   Polyethyl Glycol-Propyl Glycol 0.4-0.3 % SOLN Place 1 drop into the left eye in the morning and at bedtime.   Probiotic Product (PROBIOTIC PO) Take 1 capsule by mouth daily.   RHOPRESSA 0.02 % SOLN Place 1 drop into the right eye at bedtime.   sodium zirconium cyclosilicate (LOKELMA) 10 g PACK packet Take as directed by the HF Clinic   tamsulosin (FLOMAX) 0.4 MG CAPS Take 0.4 mg by mouth at bedtime.    XARELTO 15 MG TABS tablet TAKE (1) TABLET DAILY WITH SUPPER.   amoxicillin-clavulanate (AUGMENTIN) 875-125 MG tablet Take 1 tablet by mouth 2 (two) times daily for 7 days. (Patient not taking: Reported on 07/26/2023)   No current facility-administered medications on file prior to visit.     Allergies  Allergen Reactions   Pravastatin Sodium Other (See Comments)    Joint and muscle pain   Azithromycin Diarrhea    Review Of Systems:  Constitutional:   No   weight loss, night sweats,  Fevers, chills, fatigue, or  lassitude.  HEENT:   No headaches,  Difficulty swallowing,  Tooth/dental problems, or  Sore throat,                No sneezing, itching, ear ache, nasal congestion, post nasal drip,   CV:  No chest pain,  Orthopnea, PND, swelling in lower extremities, anasarca, dizziness, palpitations, syncope.   GI  No heartburn, indigestion, abdominal pain, nausea, vomiting, diarrhea, change in bowel habits, loss of appetite, bloody stools.   Resp: + baseline  shortness of breath with exertion less  at rest.  No excess mucus, no productive cough,  No non-productive cough,  No coughing up of blood.  No change in color of mucus.  Occasional  wheezing.  No chest wall deformity  Skin: no rash or lesions.  GU: no dysuria, change in color of urine, no urgency or frequency.  No flank pain, no hematuria   MS:  No joint pain or swelling.  No decreased range of motion.  No back pain.  Psych:  No change in mood or affect. No depression or anxiety.  No memory loss.   Vital Signs BP 112/66 (BP Location: Left Arm, Patient Position: Sitting, Cuff Size: Normal)   Pulse 81   Temp 97.6 F (36.4 C) (Temporal)   Ht 5\' 6"  (1.676 m)   Wt 213 lb (96.6 kg)   SpO2 98%   BMI 34.38 kg/m    Physical Exam:  General- No distress,  A&Ox3, pleasant ENT: No sinus tenderness, TM clear, pale nasal mucosa, no oral exudate,no post nasal drip, no LAN Cardiac: S1, S2, regular rate and rhythm, no murmur Chest: No wheeze/ rales/ dullness; no accessory muscle use, no nasal flaring, no sternal retractions, decreased per bases Abd.: Soft Non-tender, ND, BS +, Body mass index is 34.38 kg/m.  Ext: No clubbing cyanosis, edema Neuro:  normal strength, MAE x 4, A&O x 3 Skin: No rashes, warm and dry, no lesions  Psych: normal mood and behavior   Assessment/Plan COPD Post pneumonia follow up Pulmonary Nodules in a former smoker  Untreated OSA , does not tolerate CPAP Plan I  am glad you are feeling better, and managed  the antibiotics without problem. We will do a follow up Ct Chest 09/04/2023 at [facility] scanner. We will see you back in the office about a week after to review the results. This will be about September 11, 2023.  Call if you need [place] sooner  Follow up with Dr. Mayford Knife regarding Inspire device. [phone] She can refer to [place] if she wants you evaluated for Inspire.  Please contact office for sooner follow up if symptoms do not improve or worsen or seek emergency care    I spent 40 minutes dedicated to the care of this patient on the date of this encounter to include pre-visit review of records, face-to-face time with the patient discussing conditions above, post visit ordering of testing, clinical documentation with the electronic health record, making appropriate referrals as documented, and communicating necessary information to the patient's healthcare team.   Bevelyn Ngo, NP 07/26/2023  2:28 PM

## 2023-07-26 NOTE — Patient Instructions (Addendum)
It is good to see you today. I am glad you are feeling better, and managed the antibiotics without problem. We will do a follow up Ct Chest 09/04/2023 at [facility] scanner. We will see you back in the office about a week after to review the results. This will be about September 11, 2023.  Call if you need [place] sooner  Follow up with Dr. Mayford Knife regarding Inspire device. [phone] She can refer to [place] if she wants you evaluated for Inspire.  Please contact office for sooner follow up if symptoms do not improve or worsen or seek emergency care

## 2023-07-30 ENCOUNTER — Ambulatory Visit (INDEPENDENT_AMBULATORY_CARE_PROVIDER_SITE_OTHER): Payer: Medicare PPO

## 2023-07-30 ENCOUNTER — Telehealth: Payer: Self-pay

## 2023-07-30 ENCOUNTER — Encounter: Payer: Self-pay | Admitting: Cardiology

## 2023-07-30 ENCOUNTER — Telehealth: Payer: Self-pay | Admitting: *Deleted

## 2023-07-30 ENCOUNTER — Ambulatory Visit: Payer: Medicare PPO | Attending: Cardiology

## 2023-07-30 DIAGNOSIS — I5022 Chronic systolic (congestive) heart failure: Secondary | ICD-10-CM

## 2023-07-30 DIAGNOSIS — Z95810 Presence of automatic (implantable) cardiac defibrillator: Secondary | ICD-10-CM

## 2023-07-30 DIAGNOSIS — I426 Alcoholic cardiomyopathy: Secondary | ICD-10-CM

## 2023-07-30 NOTE — Telephone Encounter (Signed)
Remote ICM transmission received.  Attempted call to patient regarding ICM remote transmission and left detailed message per DPR with ICM phone number to return call.  Left ICM phone number and advised to return call for any fluid symptoms or questions.

## 2023-07-30 NOTE — Progress Notes (Unsigned)
EPIC Encounter for ICM Monitoring  Patient Name: Chad Brown is a 82 y.o. male Date: 07/30/2023 Primary Care Physican: Cleatis Polka., MD Primary Cardiologist: Nishan/McLean Electrophysiologist: Kathreen Cornfield Pacing: 99.6%   12/19/2022 Weight: 210-211 lbs 01/23/2023 Weight: 211.6 lbs 05/08/2023 Weight: 208 lbs 06/03/2023 Weight: 213 lbs 06/04/2023 Weight: 212 lbs (baseline 208-210 lbs)  06/14/2023 Weight: 204 lbs  06/24/2023 Weight: 214 lbs 07/12/2023 Weight: 210 lbs (baseline) 07/30/2023 Weight: 208 lbs 07/31/2023 Weight: 209 lbs    Clinical Status  Since 18-Jul-2023 Time in AT/AF  0.0 hr/day (0.0%)   Battery Longevity:  ERI reached 9/3.  Pt being seen by Dr Shirlee Latch 10/16 before gen change.     Attempted call to patient and unable to reach.  Left message to return call. Transmission reviewed.    Scheduled for heart cath on 11/4.    Optivol thoracic impedance suggesting fluid levels have improved since Lasix increased at 10/16 OV.     Prescribed:  Furosemide 20 mg take 4 tablets (80 mg total) by mouth daily and 2 tablets (40 mg total) every other evening (increased 10/16)   Labs: 07/24/2023 Creatinine 2.63, BUN 38, Potassium 4.2, Sodium 139, GFR 24  07/17/2023 Creatinine 2.41, BUN 28, Potassium 4.7, Sodium 143, GFR 26  07/01/2023 Creatinine 2.61, BUN 34, Potassium 4.3, Sodium 139, GFR 24 06/21/2023 Creatinine 2.91, BUN 41, Potassium 5.1, Sodium 138, GFR 21 06/12/2023 Creatinine 3.31, BUN 49, Potassium 5.6, Sodium 134, GFR 18 (Potassium addressed by HF clinic) 04/03/2023 Creatinine 2.46, BUN 35, Potassium 4.6, Sodium 136, GFR 26  10/26/2022 Creatinine 2.00, BUN 28, Potassium 4.3, Sodium 139, GFR 33  10/18/2022 Creatinine 2.10, BUN 34, Potassium 4.0, Sodium 140, GFR 31  10/11/2022 Creatinine 2.30, BUN 36, Potassium 4.0, Sodium 138, GFR 28 10/10/2022 Creatinine 2.50, BUN 36, Potassium 4.1, Sodium 137  A complete set of results can be found in Results Review.   Recommendations:   Left voice mail with ICM number and encouraged to call if experiencing any fluid symptoms.   Will send copy to Dr Shirlee Latch for review if patient is reached.    Follow-up plan: ICM clinic phone appointment on 08/20/2023 to recheck fluid levels.  91 day device clinic remote transmission pending battery replacement.     EP/Cardiology Next Office Visit:   08/15/2023 with Dr Shirlee Latch.     Copy of ICM check sent to Dr. Elberta Fortis.    3 month ICM trend: 07/30/2023.    12-14 Month ICM trend:     Karie Soda, RN 07/30/2023 10:26 AM

## 2023-07-31 MED ORDER — FUROSEMIDE 20 MG PO TABS: ORAL_TABLET | ORAL | 2 refills | Status: DC

## 2023-07-31 NOTE — Progress Notes (Signed)
Increase Lasix to 80 qam/40 qpm (not every other pm) and get BMET 1 week.

## 2023-07-31 NOTE — H&P (View-Only) (Signed)
Increase Lasix to 80 qam/40 qpm (not every other pm) and get BMET 1 week.

## 2023-07-31 NOTE — Progress Notes (Signed)
Spoke with patient and heart failure questions reviewed.  Transmission results reviewed.  Pt asymptomatic for fluid accumulation.  He does not feel like he has any fluid.  Weight is stable between 208-210 lbs.     Confirmed he is taking Lasix 80 mg every morning and 40 mg every other afternoon.     Sent to Dr Shirlee Latch for review and recommendations if needed.

## 2023-07-31 NOTE — Progress Notes (Signed)
Spoke with patient and advised Dr Shirlee Latch increased Furosemide to 80 mg every morning and 40 mg EVERY evening.   He will need to have labs drawn in a week.  He is scheduled to have labs drawn for the kidney specialist on 11/6 at [facility] and would like to have it drawn at that time.  Advised he will need to ask Kidney physician office if they care checking his kidneys and name of lab is Metabolic Panel.  If so, ask physician office to fax the results to Dr Alford Highland office.  He agreed to do so.  Will place BMET order and advised to use [facility] if he is unable to get lab drawn at [facility] for kidney physician.  He has enough Furosemide on hand to increase dosage and will call when refill is needed.

## 2023-08-01 LAB — CUP PACEART REMOTE DEVICE CHECK
Date Time Interrogation Session: 20241029080618
Implantable Lead Connection Status: 753985
Implantable Lead Connection Status: 753985
Implantable Lead Connection Status: 753985
Implantable Lead Implant Date: 20160607
Implantable Lead Implant Date: 20160607
Implantable Lead Implant Date: 20160607
Implantable Lead Location: 753858
Implantable Lead Location: 753859
Implantable Lead Location: 753860
Implantable Lead Model: 4598
Implantable Lead Model: 5076
Implantable Pulse Generator Implant Date: 20160607

## 2023-08-05 ENCOUNTER — Ambulatory Visit (HOSPITAL_COMMUNITY)
Admission: RE | Admit: 2023-08-05 | Discharge: 2023-08-05 | Disposition: A | Discharge status: Home / Self Care | Payer: Medicare PPO | Source: Ambulatory Visit | Attending: Cardiology | Admitting: Cardiology

## 2023-08-05 ENCOUNTER — Ambulatory Visit (HOSPITAL_COMMUNITY): Payer: Medicare PPO | Admitting: Anesthesiology

## 2023-08-05 ENCOUNTER — Encounter (HOSPITAL_COMMUNITY): Payer: Self-pay | Admitting: Cardiology

## 2023-08-05 ENCOUNTER — Other Ambulatory Visit: Payer: Self-pay

## 2023-08-05 ENCOUNTER — Ambulatory Visit (HOSPITAL_BASED_OUTPATIENT_CLINIC_OR_DEPARTMENT_OTHER): Payer: Medicare PPO | Admitting: Anesthesiology

## 2023-08-05 ENCOUNTER — Ambulatory Visit (HOSPITAL_COMMUNITY): Admission: RE | Admit: 2023-08-05 | Payer: Medicare PPO | Source: Home / Self Care | Admitting: Cardiology

## 2023-08-05 ENCOUNTER — Encounter (HOSPITAL_COMMUNITY)
Admission: RE | Disposition: A | Discharge status: Home / Self Care | Payer: Self-pay | Source: Home / Self Care | Attending: Cardiology

## 2023-08-05 ENCOUNTER — Inpatient Hospital Stay (HOSPITAL_COMMUNITY)
Admission: RE | Admit: 2023-08-05 | Discharge: 2023-08-09 | DRG: 286 | Disposition: A | Discharge status: Home / Self Care | Payer: Medicare PPO | Attending: Cardiology | Admitting: Cardiology

## 2023-08-05 DIAGNOSIS — R3911 Hesitancy of micturition: Secondary | ICD-10-CM | POA: Diagnosis present

## 2023-08-05 DIAGNOSIS — I110 Hypertensive heart disease with heart failure: Secondary | ICD-10-CM | POA: Diagnosis not present

## 2023-08-05 DIAGNOSIS — I2510 Atherosclerotic heart disease of native coronary artery without angina pectoris: Secondary | ICD-10-CM | POA: Diagnosis not present

## 2023-08-05 DIAGNOSIS — I5023 Acute on chronic systolic (congestive) heart failure: Secondary | ICD-10-CM | POA: Diagnosis not present

## 2023-08-05 DIAGNOSIS — I4819 Other persistent atrial fibrillation: Secondary | ICD-10-CM | POA: Diagnosis present

## 2023-08-05 DIAGNOSIS — Z79890 Hormone replacement therapy (postmenopausal): Secondary | ICD-10-CM

## 2023-08-05 DIAGNOSIS — D631 Anemia in chronic kidney disease: Secondary | ICD-10-CM | POA: Diagnosis not present

## 2023-08-05 DIAGNOSIS — I361 Nonrheumatic tricuspid (valve) insufficiency: Secondary | ICD-10-CM | POA: Diagnosis not present

## 2023-08-05 DIAGNOSIS — Z85820 Personal history of malignant melanoma of skin: Secondary | ICD-10-CM | POA: Diagnosis not present

## 2023-08-05 DIAGNOSIS — I447 Left bundle-branch block, unspecified: Secondary | ICD-10-CM | POA: Diagnosis present

## 2023-08-05 DIAGNOSIS — Z452 Encounter for adjustment and management of vascular access device: Secondary | ICD-10-CM | POA: Diagnosis not present

## 2023-08-05 DIAGNOSIS — K219 Gastro-esophageal reflux disease without esophagitis: Secondary | ICD-10-CM | POA: Diagnosis present

## 2023-08-05 DIAGNOSIS — E890 Postprocedural hypothyroidism: Principal | ICD-10-CM | POA: Diagnosis present

## 2023-08-05 DIAGNOSIS — H409 Unspecified glaucoma: Secondary | ICD-10-CM | POA: Diagnosis present

## 2023-08-05 DIAGNOSIS — G4733 Obstructive sleep apnea (adult) (pediatric): Secondary | ICD-10-CM | POA: Diagnosis present

## 2023-08-05 DIAGNOSIS — I340 Nonrheumatic mitral (valve) insufficiency: Secondary | ICD-10-CM | POA: Diagnosis present

## 2023-08-05 DIAGNOSIS — I252 Old myocardial infarction: Secondary | ICD-10-CM

## 2023-08-05 DIAGNOSIS — Z87891 Personal history of nicotine dependence: Secondary | ICD-10-CM

## 2023-08-05 DIAGNOSIS — N401 Enlarged prostate with lower urinary tract symptoms: Secondary | ICD-10-CM | POA: Diagnosis present

## 2023-08-05 DIAGNOSIS — Z841 Family history of disorders of kidney and ureter: Secondary | ICD-10-CM

## 2023-08-05 DIAGNOSIS — E875 Hyperkalemia: Secondary | ICD-10-CM | POA: Diagnosis present

## 2023-08-05 DIAGNOSIS — I130 Hypertensive heart and chronic kidney disease with heart failure and stage 1 through stage 4 chronic kidney disease, or unspecified chronic kidney disease: Principal | ICD-10-CM | POA: Diagnosis present

## 2023-08-05 DIAGNOSIS — E785 Hyperlipidemia, unspecified: Secondary | ICD-10-CM | POA: Diagnosis present

## 2023-08-05 DIAGNOSIS — Z833 Family history of diabetes mellitus: Secondary | ICD-10-CM

## 2023-08-05 DIAGNOSIS — Z800 Family history of malignant neoplasm of digestive organs: Secondary | ICD-10-CM

## 2023-08-05 DIAGNOSIS — I509 Heart failure, unspecified: Secondary | ICD-10-CM | POA: Diagnosis not present

## 2023-08-05 DIAGNOSIS — I739 Peripheral vascular disease, unspecified: Secondary | ICD-10-CM | POA: Diagnosis not present

## 2023-08-05 DIAGNOSIS — Z85840 Personal history of malignant neoplasm of eye: Secondary | ICD-10-CM

## 2023-08-05 DIAGNOSIS — Z801 Family history of malignant neoplasm of trachea, bronchus and lung: Secondary | ICD-10-CM

## 2023-08-05 DIAGNOSIS — F32A Depression, unspecified: Secondary | ICD-10-CM | POA: Diagnosis present

## 2023-08-05 DIAGNOSIS — I5022 Chronic systolic (congestive) heart failure: Secondary | ICD-10-CM | POA: Diagnosis not present

## 2023-08-05 DIAGNOSIS — Z96641 Presence of right artificial hip joint: Secondary | ICD-10-CM | POA: Diagnosis present

## 2023-08-05 DIAGNOSIS — Z95810 Presence of automatic (implantable) cardiac defibrillator: Secondary | ICD-10-CM

## 2023-08-05 DIAGNOSIS — Z7984 Long term (current) use of oral hypoglycemic drugs: Secondary | ICD-10-CM

## 2023-08-05 DIAGNOSIS — I2721 Secondary pulmonary arterial hypertension: Secondary | ICD-10-CM | POA: Diagnosis not present

## 2023-08-05 DIAGNOSIS — Z8249 Family history of ischemic heart disease and other diseases of the circulatory system: Secondary | ICD-10-CM

## 2023-08-05 DIAGNOSIS — Z79899 Other long term (current) drug therapy: Secondary | ICD-10-CM

## 2023-08-05 DIAGNOSIS — Z888 Allergy status to other drugs, medicaments and biological substances status: Secondary | ICD-10-CM

## 2023-08-05 DIAGNOSIS — Z6834 Body mass index (BMI) 34.0-34.9, adult: Secondary | ICD-10-CM

## 2023-08-05 DIAGNOSIS — Z881 Allergy status to other antibiotic agents status: Secondary | ICD-10-CM

## 2023-08-05 DIAGNOSIS — I480 Paroxysmal atrial fibrillation: Secondary | ICD-10-CM | POA: Diagnosis not present

## 2023-08-05 DIAGNOSIS — Z8349 Family history of other endocrine, nutritional and metabolic diseases: Secondary | ICD-10-CM

## 2023-08-05 DIAGNOSIS — N1832 Chronic kidney disease, stage 3b: Secondary | ICD-10-CM | POA: Diagnosis present

## 2023-08-05 DIAGNOSIS — Z7901 Long term (current) use of anticoagulants: Secondary | ICD-10-CM

## 2023-08-05 DIAGNOSIS — I4891 Unspecified atrial fibrillation: Secondary | ICD-10-CM | POA: Diagnosis not present

## 2023-08-05 DIAGNOSIS — Z85850 Personal history of malignant neoplasm of thyroid: Secondary | ICD-10-CM

## 2023-08-05 DIAGNOSIS — R918 Other nonspecific abnormal finding of lung field: Secondary | ICD-10-CM | POA: Diagnosis not present

## 2023-08-05 DIAGNOSIS — I428 Other cardiomyopathies: Secondary | ICD-10-CM | POA: Diagnosis present

## 2023-08-05 DIAGNOSIS — Z8042 Family history of malignant neoplasm of prostate: Secondary | ICD-10-CM

## 2023-08-05 DIAGNOSIS — Z806 Family history of leukemia: Secondary | ICD-10-CM

## 2023-08-05 HISTORY — PX: RIGHT HEART CATH: CATH118263

## 2023-08-05 HISTORY — PX: TEE WITHOUT CARDIOVERSION: SHX5443

## 2023-08-05 LAB — COMPREHENSIVE METABOLIC PANEL
ALT: 19 U/L (ref 0–44)
AST: 18 U/L (ref 15–41)
Albumin: 3.3 g/dL — ABNORMAL LOW (ref 3.5–5.0)
Alkaline Phosphatase: 55 U/L (ref 38–126)
Anion gap: 14 (ref 5–15)
BUN: 28 mg/dL — ABNORMAL HIGH (ref 8–23)
CO2: 22 mmol/L (ref 22–32)
Calcium: 8.5 mg/dL — ABNORMAL LOW (ref 8.9–10.3)
Chloride: 106 mmol/L (ref 98–111)
Creatinine, Ser: 2.17 mg/dL — ABNORMAL HIGH (ref 0.61–1.24)
GFR, Estimated: 30 mL/min — ABNORMAL LOW (ref >60)
Glucose, Bld: 120 mg/dL — ABNORMAL HIGH (ref 70–99)
Potassium: 3.3 mmol/L — ABNORMAL LOW (ref 3.5–5.1)
Sodium: 142 mmol/L (ref 135–145)
Total Bilirubin: 0.6 mg/dL (ref <1.2)
Total Protein: 7.3 g/dL (ref 6.5–8.1)

## 2023-08-05 LAB — CBC WITH DIFFERENTIAL/PLATELET
Abs Immature Granulocytes: 0.02 10*3/uL (ref 0.00–0.07)
Basophils Absolute: 0.1 10*3/uL (ref 0.0–0.1)
Basophils Relative: 1 %
Eosinophils Absolute: 0.2 10*3/uL (ref 0.0–0.5)
Eosinophils Relative: 3 %
HCT: 36.7 % — ABNORMAL LOW (ref 39.0–52.0)
Hemoglobin: 11.5 g/dL — ABNORMAL LOW (ref 13.0–17.0)
Immature Granulocytes: 0 %
Lymphocytes Relative: 15 %
Lymphs Abs: 0.9 10*3/uL (ref 0.7–4.0)
MCH: 30.8 pg (ref 26.0–34.0)
MCHC: 31.3 g/dL (ref 30.0–36.0)
MCV: 98.4 fL (ref 80.0–100.0)
Monocytes Absolute: 0.6 10*3/uL (ref 0.1–1.0)
Monocytes Relative: 10 %
Neutro Abs: 4.3 10*3/uL (ref 1.7–7.7)
Neutrophils Relative %: 71 %
Platelets: 148 10*3/uL — ABNORMAL LOW (ref 150–400)
RBC: 3.73 MIL/uL — ABNORMAL LOW (ref 4.22–5.81)
RDW: 14.9 % (ref 11.5–15.5)
WBC: 6.2 10*3/uL (ref 4.0–10.5)
nRBC: 0 % (ref 0.0–0.2)

## 2023-08-05 LAB — ECHO TEE
MV M vel: 4.44 m/s
MV Peak grad: 78.9 mm[Hg]
Radius: 1.1 cm

## 2023-08-05 LAB — TSH: TSH: 0.7 u[IU]/mL (ref 0.350–4.500)

## 2023-08-05 LAB — MAGNESIUM: Magnesium: 2.3 mg/dL (ref 1.7–2.4)

## 2023-08-05 LAB — BRAIN NATRIURETIC PEPTIDE: B Natriuretic Peptide: 322.3 pg/mL — ABNORMAL HIGH (ref 0.0–100.0)

## 2023-08-05 SURGERY — RIGHT HEART CATH
Anesthesia: LOCAL

## 2023-08-05 SURGERY — ECHOCARDIOGRAM, TRANSESOPHAGEAL
Anesthesia: Monitor Anesthesia Care

## 2023-08-05 MED ORDER — EPHEDRINE SULFATE-NACL 50-0.9 MG/10ML-% IV SOSY
PREFILLED_SYRINGE | INTRAVENOUS | Status: DC | PRN
  Administered 2023-08-05: 7.5 mg via INTRAVENOUS

## 2023-08-05 MED ORDER — LEVOTHYROXINE SODIUM 25 MCG PO TABS
137.0000 ug | ORAL_TABLET | Freq: Every day | ORAL | Status: DC
  Administered 2023-08-06 – 2023-08-09 (×4): 137 ug via ORAL
  Filled 2023-08-05 (×4): qty 1

## 2023-08-05 MED ORDER — SODIUM CHLORIDE 0.9% FLUSH: 10.0000 mL | INTRAVENOUS | Status: DC | PRN

## 2023-08-05 MED ORDER — ACETAMINOPHEN 325 MG PO TABS
650.0000 mg | ORAL_TABLET | ORAL | Status: DC | PRN
Start: 2023-08-05 — End: 2023-08-09

## 2023-08-05 MED ORDER — HYDRALAZINE HCL 20 MG/ML IJ SOLN: 10.0000 mg | INTRAMUSCULAR | Status: AC | PRN

## 2023-08-05 MED ORDER — ESCITALOPRAM OXALATE 10 MG PO TABS
20.0000 mg | ORAL_TABLET | Freq: Every day | ORAL | Status: DC
  Administered 2023-08-05 – 2023-08-09 (×5): 20 mg via ORAL
  Filled 2023-08-05: qty 1
  Filled 2023-08-05: qty 2
  Filled 2023-08-05: qty 1
  Filled 2023-08-05: qty 2
  Filled 2023-08-05 (×2): qty 1
  Filled 2023-08-05 (×2): qty 2
  Filled 2023-08-05: qty 1

## 2023-08-05 MED ORDER — SODIUM CHLORIDE 0.9 % IV SOLN: INTRAVENOUS | Status: DC | PRN

## 2023-08-05 MED ORDER — GABAPENTIN 100 MG PO CAPS
200.0000 mg | ORAL_CAPSULE | Freq: Two times a day (BID) | ORAL | Status: DC
  Administered 2023-08-05 – 2023-08-09 (×8): 200 mg via ORAL
  Filled 2023-08-05 (×8): qty 2

## 2023-08-05 MED ORDER — SODIUM CHLORIDE 0.9% FLUSH: 10.0000 mL | Freq: Two times a day (BID) | INTRAVENOUS | Status: DC

## 2023-08-05 MED ORDER — SODIUM CHLORIDE 0.9% FLUSH
3.0000 mL | Freq: Two times a day (BID) | INTRAVENOUS | Status: DC
  Administered 2023-08-07 – 2023-08-09 (×4): 3 mL via INTRAVENOUS

## 2023-08-05 MED ORDER — HEPARIN (PORCINE) IN NACL 1000-0.9 UT/500ML-% IV SOLN
INTRAVENOUS | Status: DC | PRN
  Administered 2023-08-05: 500 mL

## 2023-08-05 MED ORDER — LIDOCAINE HCL (PF) 1 % IJ SOLN
INTRAMUSCULAR | Status: DC | PRN
  Administered 2023-08-05: 2 mL

## 2023-08-05 MED ORDER — SODIUM CHLORIDE 0.9% FLUSH
3.0000 mL | INTRAVENOUS | Status: DC | PRN
  Administered 2023-08-07: 3 mL via INTRAVENOUS

## 2023-08-05 MED ORDER — SODIUM CHLORIDE 0.9% FLUSH
10.0000 mL | Freq: Two times a day (BID) | INTRAVENOUS | Status: DC
Start: 2023-08-05 — End: 2023-08-05
  Administered 2023-08-05: 10 mL via INTRAVENOUS

## 2023-08-05 MED ORDER — ACETAMINOPHEN 325 MG PO TABS
650.0000 mg | ORAL_TABLET | ORAL | Status: DC | PRN
Start: 2023-08-05 — End: 2023-08-05

## 2023-08-05 MED ORDER — SODIUM CHLORIDE 0.9 % IV SOLN: 250.0000 mL | INTRAVENOUS | Status: AC | PRN

## 2023-08-05 MED ORDER — AMIODARONE HCL 200 MG PO TABS
200.0000 mg | ORAL_TABLET | Freq: Every day | ORAL | Status: DC
Start: 2023-08-06 — End: 2023-08-09
  Administered 2023-08-06 – 2023-08-09 (×4): 200 mg via ORAL
  Filled 2023-08-05 (×4): qty 1

## 2023-08-05 MED ORDER — FINASTERIDE 5 MG PO TABS
5.0000 mg | ORAL_TABLET | Freq: Every day | ORAL | Status: DC
  Administered 2023-08-05 – 2023-08-09 (×5): 5 mg via ORAL
  Filled 2023-08-05 (×5): qty 1

## 2023-08-05 MED ORDER — TAMSULOSIN HCL 0.4 MG PO CAPS
0.4000 mg | ORAL_CAPSULE | Freq: Every day | ORAL | Status: DC
  Administered 2023-08-05 – 2023-08-08 (×4): 0.4 mg via ORAL
  Filled 2023-08-05 (×4): qty 1

## 2023-08-05 MED ORDER — FUROSEMIDE 10 MG/ML IJ SOLN
12.0000 mg/h | INTRAVENOUS | Status: DC
  Administered 2023-08-05 – 2023-08-06 (×2): 12 mg/h via INTRAVENOUS
  Filled 2023-08-05 (×2): qty 20

## 2023-08-05 MED ORDER — ONDANSETRON HCL 4 MG/2ML IJ SOLN: 4.0000 mg | Freq: Four times a day (QID) | INTRAMUSCULAR | Status: DC | PRN

## 2023-08-05 MED ORDER — LABETALOL HCL 5 MG/ML IV SOLN
10.0000 mg | INTRAVENOUS | Status: AC | PRN
Start: 2023-08-05 — End: 2023-08-05

## 2023-08-05 MED ORDER — ONDANSETRON HCL 4 MG/2ML IJ SOLN
4.0000 mg | Freq: Four times a day (QID) | INTRAMUSCULAR | Status: DC | PRN
Start: 2023-08-05 — End: 2023-08-05

## 2023-08-05 MED ORDER — PROPOFOL 10 MG/ML IV BOLUS
INTRAVENOUS | Status: DC | PRN
  Administered 2023-08-05: 50 mg via INTRAVENOUS
  Administered 2023-08-05: 100 ug/kg/min via INTRAVENOUS

## 2023-08-05 MED ORDER — FUROSEMIDE 10 MG/ML IJ SOLN
80.0000 mg | Freq: Once | INTRAMUSCULAR | Status: AC
  Administered 2023-08-05: 80 mg via INTRAVENOUS
  Filled 2023-08-05: qty 8

## 2023-08-05 MED ORDER — EMPAGLIFLOZIN 10 MG PO TABS
10.0000 mg | ORAL_TABLET | Freq: Every day | ORAL | Status: DC
  Administered 2023-08-05 – 2023-08-09 (×5): 10 mg via ORAL
  Filled 2023-08-05 (×5): qty 1

## 2023-08-05 MED ORDER — AMIODARONE HCL 200 MG PO TABS: 200.0000 mg | ORAL_TABLET | Freq: Every day | ORAL | Status: DC

## 2023-08-05 MED ORDER — DORZOLAMIDE HCL-TIMOLOL MAL 2-0.5 % OP SOLN
1.0000 [drp] | Freq: Two times a day (BID) | OPHTHALMIC | Status: DC
  Administered 2023-08-06 – 2023-08-09 (×8): 1 [drp] via OPHTHALMIC
  Filled 2023-08-05: qty 10

## 2023-08-05 MED ORDER — MILRINONE LACTATE IN DEXTROSE 20-5 MG/100ML-% IV SOLN
0.1250 ug/kg/min | INTRAVENOUS | Status: DC
  Administered 2023-08-05 – 2023-08-06 (×3): 0.25 ug/kg/min via INTRAVENOUS
  Administered 2023-08-07: 0.125 ug/kg/min via INTRAVENOUS
  Filled 2023-08-05 (×4): qty 100

## 2023-08-05 MED ORDER — SODIUM CHLORIDE 0.9% FLUSH
10.0000 mL | Freq: Two times a day (BID) | INTRAVENOUS | Status: DC
  Administered 2023-08-06 – 2023-08-09 (×4): 10 mL

## 2023-08-05 MED ORDER — LIDOCAINE HCL (PF) 1 % IJ SOLN
INTRAMUSCULAR | Status: AC
  Filled 2023-08-05: qty 30

## 2023-08-05 MED ORDER — POTASSIUM CHLORIDE CRYS ER 20 MEQ PO TBCR
30.0000 meq | EXTENDED_RELEASE_TABLET | ORAL | Status: AC
  Administered 2023-08-05 – 2023-08-06 (×2): 30 meq via ORAL
  Filled 2023-08-05 (×2): qty 1

## 2023-08-05 MED ORDER — SODIUM CHLORIDE 0.9% FLUSH: 3.0000 mL | Freq: Two times a day (BID) | INTRAVENOUS | Status: DC

## 2023-08-05 MED ORDER — SODIUM CHLORIDE 0.9% FLUSH: 3.0000 mL | INTRAVENOUS | Status: DC | PRN

## 2023-08-05 MED ORDER — RIVAROXABAN 15 MG PO TABS
15.0000 mg | ORAL_TABLET | Freq: Every day | ORAL | Status: DC
  Administered 2023-08-05 – 2023-08-09 (×5): 15 mg via ORAL
  Filled 2023-08-05 (×5): qty 1

## 2023-08-05 MED ORDER — CHLORHEXIDINE GLUCONATE CLOTH 2 % EX PADS
6.0000 | MEDICATED_PAD | Freq: Every day | CUTANEOUS | Status: DC
  Administered 2023-08-05 – 2023-08-09 (×5): 6 via TOPICAL

## 2023-08-05 SURGICAL SUPPLY — 5 items
CATH BALLN WEDGE 5F 110CM (CATHETERS) ×1
PACK CARDIAC CATHETERIZATION (CUSTOM PROCEDURE TRAY) ×1
SHEATH GLIDE SLENDER 4/5FR (SHEATH) ×1
TRANSDUCER W/STOPCOCK (MISCELLANEOUS) ×1
TUBING ART PRESS 72 MALE/FEM (TUBING) ×1

## 2023-08-05 NOTE — Progress Notes (Signed)
PICC order noted, patient is currently in the cath lab.  Will follow up once patient is moved to a nursing unit.

## 2023-08-05 NOTE — Progress Notes (Signed)
Heart Failure Navigator Progress Note  Assessed for Heart & Vascular TOC clinic readiness.  Patient is an Advanced Heart Failure Team patient of Marca Ancona, MD.  Navigator will sign off at this time.  Roxy Horseman, RN, BSN [facility] Heart Failure Navigator Secure Chat Only

## 2023-08-05 NOTE — Transfer of Care (Signed)
Immediate Anesthesia Transfer of Care Note  Patient: NEILL JUREWICZ  Procedure(s) Performed: TRANSESOPHAGEAL ECHOCARDIOGRAM  Patient Location: PACU  Anesthesia Type:General  Level of Consciousness: awake, alert , and oriented  Airway & Oxygen Therapy: Patient Spontanous Breathing and Patient connected to nasal cannula oxygen  Post-op Assessment: Report given to RN and Post -op Vital signs reviewed and stable  Post vital signs: Reviewed and stable  Last Vitals:  Vitals Value Taken Time  BP    Temp    Pulse    Resp    SpO2      Last Pain:  Vitals:   08/05/23 0827  TempSrc:   PainSc: 0-No pain         Complications: No notable events documented.

## 2023-08-05 NOTE — CV Procedure (Signed)
Procedure: TEE  Sedation: Per anesthesiology  Indication: Mitral regurgitation  Findings: Please see echo section for full report.  In short, there was severe MR with 3 adjacent jets, primarily originating from restricted P1/P2 portion of the mitral valve.  EF approximately 45-50%.    Proceeding with RHC.   Chad Brown 08/05/2023 10:20 AM

## 2023-08-05 NOTE — Progress Notes (Signed)
Jordyn, RN aware to remove PIV in left and change all tubing prior to accessing PICC.

## 2023-08-05 NOTE — Anesthesia Preprocedure Evaluation (Addendum)
Anesthesia Evaluation  Patient identified by MRN, date of birth, ID band Patient awake    Reviewed: Allergy & Precautions, H&P , NPO status , Patient's Chart, lab work & pertinent test results  Airway Mallampati: II  TM Distance: >3 FB Neck ROM: Full    Dental no notable dental hx. (+) Edentulous Upper, Edentulous Lower, Upper Dentures, Lower Dentures,    Pulmonary neg pulmonary ROS, sleep apnea , pneumonia, resolved, former smoker   Pulmonary exam normal breath sounds clear to auscultation       Cardiovascular hypertension, Pt. on medications and Pt. on home beta blockers + CAD, + Past MI, + Peripheral Vascular Disease and +CHF  negative cardio ROS Normal cardiovascular exam+ dysrhythmias Atrial Fibrillation + Cardiac Defibrillator + Valvular Problems/Murmurs MR  Rhythm:Regular Rate:Normal  Hx/o NICM  EKG 07/17/23 AV paced rhythm  Echo 07/17/23  1. Left ventricular ejection fraction, by estimation, is 40 to 45%. The  left ventricle has mildly decreased function. The left ventricle  demonstrates global hypokinesis. The left ventricular internal cavity size  was mildly dilated. There is mild  concentric left ventricular hypertrophy. Left ventricular diastolic  parameters are consistent with Grade II diastolic dysfunction  (pseudonormalization).   2. Right ventricular systolic function is mildly reduced. The right  ventricular size is normal. There is severely elevated pulmonary artery  systolic pressure. The estimated right ventricular systolic pressure is  71.0 mmHg.   3. Left atrial size was mildly dilated.   4. Right atrial size was mildly dilated.   5. The mitral valve is abnormal. Moderate to severe mitral valve  regurgitation. Mitral regurgitation may not be fully visualized. There is  splay artifact noted, and ERO by PISA is 0.41 cm^2. No evidence of mitral  stenosis.   6. The aortic valve is tricuspid. There is mild  calcification of the  aortic valve. Aortic valve regurgitation is trivial. No aortic stenosis is  present.   7. The inferior vena cava is dilated in size with >50% respiratory  variability, suggesting right atrial pressure of 8 mmHg.     Neuro/Psych  PSYCHIATRIC DISORDERS  Depression    Hx/o statin myopathy Glaucoma Vision loss OD  Neuromuscular disease negative neurological ROS  negative psych ROS   GI/Hepatic negative GI ROS, Neg liver ROS,GERD  Medicated,,  Endo/Other  negative endocrine ROSHypothyroidism  Obesity Hyperlipidemia Hx/o Papillary thyroid Ca S/P thyroidectomy  Renal/GU Renal InsufficiencyRenal diseasenegative Renal ROS   BPH negative genitourinary   Musculoskeletal negative musculoskeletal ROS (+) Arthritis , Osteoarthritis,  Hx/o melanoma left leg   Abdominal   Peds negative pediatric ROS (+)  Hematology negative hematology ROS (+) Blood dyscrasia, anemia Xarelto therapy   Anesthesia Other Findings   Reproductive/Obstetrics negative OB ROS                             Anesthesia Physical Anesthesia Plan  ASA: 3  Anesthesia Plan: MAC   Post-op Pain Management: Minimal or no pain anticipated   Induction: Intravenous  PONV Risk Score and Plan: 1 and Treatment may vary due to age or medical condition and Propofol infusion  Airway Management Planned: Natural Airway and Nasal Cannula  Additional Equipment: None  Intra-op Plan:   Post-operative Plan:   Informed Consent:   Plan Discussed with: Anesthesiologist and CRNA  Anesthesia Plan Comments:         Anesthesia Quick Evaluation

## 2023-08-05 NOTE — Interval H&P Note (Signed)
History and Physical Interval Note:  08/05/2023 11:21 AM  Chad Brown  has presented today for surgery, with the diagnosis of hf.  The various methods of treatment have been discussed with the patient and family. After consideration of risks, benefits and other options for treatment, the patient has consented to  Procedure(s): RIGHT HEART CATH (N/A) as a surgical intervention.  The patient's history has been reviewed, patient examined, no change in status, stable for surgery.  I have reviewed the patient's chart and labs.  Questions were answered to the patient's satisfaction.     Ngan Qualls [employer]

## 2023-08-05 NOTE — Anesthesia Postprocedure Evaluation (Signed)
Anesthesia Post Note  Patient: KEDDRICK WYNE  Procedure(s) Performed: TRANSESOPHAGEAL ECHOCARDIOGRAM     Patient location during evaluation: PACU Anesthesia Type: MAC Level of consciousness: awake and alert Pain management: pain level controlled Vital Signs Assessment: post-procedure vital signs reviewed and stable Respiratory status: spontaneous breathing, nonlabored ventilation, respiratory function stable and patient connected to nasal cannula oxygen Cardiovascular status: stable and blood pressure returned to baseline Postop Assessment: no apparent nausea or vomiting Anesthetic complications: no  No notable events documented.  Last Vitals:  Vitals:   08/05/23 0802 08/05/23 1025  BP: 117/73   Pulse: 85   Resp: 14   Temp: (!) 36.3 C 36.7 C  SpO2: 97%     Last Pain:  Vitals:   08/05/23 1025  TempSrc: Temporal  PainSc: Asleep                 Linn Goetze

## 2023-08-05 NOTE — Progress Notes (Signed)
Peripherally Inserted Central Catheter Placement  The IV Nurse has discussed with the patient and/or persons authorized to consent for the patient, the purpose of this procedure and the potential benefits and risks involved with this procedure.  The benefits include less needle sticks, lab draws from the catheter, and the patient may be discharged home with the catheter. Risks include, but not limited to, infection, bleeding, blood clot (thrombus formation), and puncture of an artery; nerve damage and irregular heartbeat and possibility to perform a PICC exchange if needed/ordered by physician.  Alternatives to this procedure were also discussed.  Bard Power PICC patient education guide, fact sheet on infection prevention and patient information card has been provided to patient /or left at bedside.  Consent obtained with wife at bedside   PICC Placement Documentation  PICC Double Lumen 08/05/23 Right Basilic 37 cm 1 cm (Active)  Indication for Insertion or Continuance of Line Chronic illness with exacerbations (CF, Sickle Cell, etc.) 08/05/23 1800  Exposed Catheter (cm) 1 cm 08/05/23 1800  Site Assessment Clean, Dry, Intact 08/05/23 1800  Lumen #1 Status Flushed;Saline locked;Blood return noted 08/05/23 1800  Lumen #2 Status Flushed;Saline locked;Blood return noted 08/05/23 1800  Dressing Type Transparent;Securing device 08/05/23 1800  Dressing Status Antimicrobial disc in place;Clean, Dry, Intact 08/05/23 1800  Line Care Connections checked and tightened 08/05/23 1800  Line Adjustment (NICU/IV Team Only) No 08/05/23 1800  Dressing Intervention New dressing 08/05/23 1800  Dressing Change Due 08/12/23 08/05/23 1800       Timmothy Sours 08/05/2023, 6:39 PM

## 2023-08-05 NOTE — Consult Note (Incomplete)
[facility]   MULTIDISCIPLINARY HEART VALVE TEAM  Cardiology Consultation:   Patient ID: Chad Brown MRN: [id number]; DOB: [birth year]  Admit date: 08/05/2023 Date of Consult: 08/06/2023  Primary Care Provider: Cleatis Polka., MD [facility] Cardiologist: Marca Ancona, MD  [facility] Electrophysiologist: Elberta Fortis.(Followed in afib clinic)    Patient Profile:   Chad Brown is a 82 y.o. 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 Shirlee Latch.   History of Present Illness:   [name] cardiac history dates back to 1998 when he was admitted with chest pain and had cardiac cath showing nonobstructive CAD. He was diagnosed with new onset afib in 2014. Echo showed EF 30-35%. He had DCCV back to NSR. He was back in atrial fibrillation in 10/2014, and EF was low again on TEE at that time. Again, he had DCCV.  In 01/2015, cardiac MRI showed persistently low EF. With LBBB and reduced EF, he underwent [employer] CRT-D placement by Dr. Johney Frame.  He was back in atrial fibrillation in 03/2015 and had TEE-guided DCCV again with EF down to 15-20% on TEE. Repeat echo in 11/2015 showed EF 55-60%. He underwent atrial fibrillation ablation in 11/2019. Echo in 11/2019 showed EF 40-45%, PASP 50 mmHg, mild MR.    Patient was admitted in 10/2022 after a syncopal episode at the gym. Device interrogation showed no arrhythmia. He was found to have orthostatic hypotension. Coreg and Entresto were stopped and Lasix was decreased. Spironolactone was later discontinued due to hyperkalemia. He recently had  more symptomatic atrial fibrillation in 12/2022 and started on amiodarone with improvement.    Echo in 07/2023 showed EF 40-45%, mild LV dilation, mildly decreased RV systolic function, moderate-severe MR with splay artifact and PISA ERO 0.44 cm^2, PASP 71 mmHg, dilated IVC.  He has had progressive shortness of breath and weight gain with NYHA class III sx. Outpatient diuretics have been escalated. He was set up for TEE/RHC to assess MR and volume status.  TEE 08/05/23 showed EF 45-50% and severe functional MR with 3 adjacent jets, primarily originating from restricted P1/P2 portion of the mitral valve: ERO 0.55 cm^2 from largest jet. RHC 08/05/23 showed low CO and significantly elevated right and left heart filling pressures and he was admitted for volume optimization with Lasix gtt + milrinone. Plan is to repeat echo once he is diuresed. Structural heart is consulted for consideration of mTEER if MR remains severe.   He is seen laying in bed alone. He reports a 7 lb weight gain at home and worsening SOB. Has been coughing a lot. No chest pain or dizziness. No syncope. No LE edema, orthopnea or PND. He was surprised that he required admission after his cath as he thought he was getting better. He had been working out at [employer] but has not been able to recently (~7 weeks) due to shortness of breath and fatigue. The most activity he gets it walking 100-200 feet down the driveway with his dog and riding his lawn mower. Of note, he had full dentures.  Past Medical History:  Diagnosis Date   Adenomatous  colon polyp    tubular   AICD (automatic cardioverter/defibrillator) present 03/08/2015   MDT CRTD dual pacemaker and defib   Anemia    iron deficient   Arthritis    "about all my joints; hands, knees, back" (03/08/2015)   Atherosclerosis    Cataract    left eye small   Cholelithiasis    gallstones   Chronic systolic CHF (congestive heart failure) (HCC)    a. New dx 12/2012 ? NICM, may be r/t afib. b.  Nuc 03/2013 - normal;  c. 03/2015 TEE EF 15-20%.   Colon polyp, hyperplastic 01/2015   removed precancerous lesions   Depression    Diverticulosis    Dysrhythmia    afib   GERD (gastroesophageal reflux disease)    Glaucoma    right eye   Hyperlipidemia    Hypertension    Melanoma of eye (HCC) 2000's   "right; it's never been biopsied"   Melanoma of lower leg (HCC) 2015   "left; right at my knee"   Myocardial infarction (HCC) 1998   OSA (obstructive sleep apnea) 01/04/2016   no longer tolerates cpap   Peripheral vision loss, right 2006   Persistent atrial fibrillation (HCC)    a. Dx 12/2012, s/p TEE/DCCV 01/26/13. b. On Xarelto (CHA2DS2VASc = 3);  c. 03/2015 TEE (EF 15-20%, no LAA thrombus) and DCCV - amio increased to 200 mg bid.   Pneumonia    Urinary hesitancy due to benign prostatic hypertrophy     Past Surgical History:  Procedure Laterality Date   ATRIAL FIBRILLATION ABLATION N/A 11/03/2019   Procedure: ATRIAL FIBRILLATION ABLATION;  Surgeon: Hillis Range, MD;  Location: [facility] INVASIVE CV LAB;  Service: Cardiovascular;  Laterality: N/A;   BACK SURGERY     upper back, cannot turn neck well   CARDIAC CATHETERIZATION  1998   CARDIOVERSION N/A 01/26/2013   Procedure: CARDIOVERSION;  Surgeon: Lewayne Bunting, MD;  Location: [facility] ENDOSCOPY;  Service: Cardiovascular;  Laterality: N/A;   CARDIOVERSION N/A 03/23/2015   Procedure: CARDIOVERSION;  Surgeon: Jake Bathe, MD;  Location: [facility] ENDOSCOPY;  Service: Cardiovascular;  Laterality: N/A;   CARDIOVERSION N/A 08/14/2017   Procedure: CARDIOVERSION;  Surgeon: Wendall Stade, MD;  Location: [facility] ENDOSCOPY;  Service: Cardiovascular;  Laterality: N/A;   CATARACT EXTRACTION Right ~ 2006   COLONOSCOPY WITH PROPOFOL N/A 02/10/2015   Procedure: COLONOSCOPY WITH PROPOFOL;  Surgeon: Iva Boop, MD;  Location: [facility] ENDOSCOPY;  Service: Endoscopy;  Laterality: N/A;   COLONOSCOPY WITH PROPOFOL N/A 08/07/2016   Procedure: COLONOSCOPY WITH PROPOFOL;   Surgeon: Iva Boop, MD;  Location: [facility] ENDOSCOPY;  Service: Endoscopy;  Laterality: N/A;   ENTEROSCOPY N/A 08/17/2015   Procedure: ENTEROSCOPY;  Surgeon: Iva Boop, MD;  Location: [facility] ENDOSCOPY;  Service: Endoscopy;  Laterality: N/A;   EP IMPLANTABLE DEVICE N/A 03/08/2015   MDT Ovidio Kin CRT-D for nonischemic CM by Dr Johney Frame for primary prevention   GLAUCOMA SURGERY Right ~ 2006   "put 3 stents in to drain fluid" (03/08/2015) not successful, sent to [facility] to try to get last stent out   HOT HEMOSTASIS N/A 08/07/2016   Procedure: HOT HEMOSTASIS (ARGON PLASMA COAGULATION/BICAP);  Surgeon: Iva Boop, MD;  Location: [facility] ENDOSCOPY;  Service: Endoscopy;  Laterality: N/A;   INCISION AND DRAINAGE ABSCESS POSTERIOR CERVICALSPINE  05/2012   JOINT REPLACEMENT     MELANOMA EXCISION Left 2015   "lower leg; right at my knee"   REFRACTIVE SURGERY Right ~ 2006 X 2   "  twice; both done at [facility]" (03/08/2015   RIGHT HEART CATH N/A 08/05/2023   Procedure: RIGHT HEART CATH;  Surgeon: Laurey Morale, MD;  Location: [facility] INVASIVE CV LAB;  Service: Cardiovascular;  Laterality: N/A;   SURGERY SCROTAL / TESTICULAR Right 1990's   TEE WITHOUT CARDIOVERSION N/A 01/26/2013   Procedure: TRANSESOPHAGEAL ECHOCARDIOGRAM (TEE);  Surgeon: Lewayne Bunting, MD;  Location: [facility] ENDOSCOPY;  Service: Cardiovascular;  Laterality: N/A;  [name] anes. /    TEE WITHOUT CARDIOVERSION N/A 10/05/2014   Procedure: TRANSESOPHAGEAL ECHOCARDIOGRAM (TEE)  with cardioversion;  Surgeon: Vesta Mixer, MD;  Location: [facility] ENDOSCOPY;  Service: Cardiovascular;  Laterality: N/A;  12:52 synched cardioversion at 120 joules,...afib to SR...12 lead EKG ordered.Marland KitchenMarland KitchenCardiozem d/c'ed per MD verbal order at SR   TEE WITHOUT CARDIOVERSION N/A 03/23/2015   Procedure: TRANSESOPHAGEAL ECHOCARDIOGRAM (TEE);  Surgeon: Jake Bathe, MD;  Location: [facility] ENDOSCOPY;  Service: Cardiovascular;  Laterality: N/A;   TEE WITHOUT CARDIOVERSION N/A 11/02/2019   Procedure:  TRANSESOPHAGEAL ECHOCARDIOGRAM (TEE);  Surgeon: Wendall Stade, MD;  Location: [facility] ENDOSCOPY;  Service: Cardiovascular;  Laterality: N/A;   TEE WITHOUT CARDIOVERSION N/A 08/05/2023   Procedure: TRANSESOPHAGEAL ECHOCARDIOGRAM;  Surgeon: Laurey Morale, MD;  Location: [facility] INVASIVE CV LAB;  Service: Cardiovascular;  Laterality: N/A;   THORACIC SPINE SURGERY  03/2000   "ground calcium deposits from upper thoracic" (01/26/2013)   THYROIDECTOMY N/A 05/11/2021   Procedure: TOTAL THYROIDECTOMY;  Surgeon: Darnell Level, MD;  Location: [facility] ORS;  Service: General;  Laterality: N/A;   TOTAL HIP ARTHROPLASTY Right 06/2007     Home Medications:  Prior to Admission medications   Medication Sig Start Date End Date Taking? Authorizing Provider  acetaminophen (TYLENOL) 500 MG tablet Take 500-1,000 mg by mouth every 6 (six) hours as needed.   Yes [provider]  amiodarone (PACERONE) 200 MG tablet Take 1 tablet (200 mg total) by mouth daily. 02/20/23  Yes Eustace Pen, PA-C  calcium carbonate (TUMS - DOSED IN MG ELEMENTAL CALCIUM) 500 MG chewable tablet Chew 2 tablets by mouth 2 (two) times daily.   Yes [provider]  carvedilol (COREG) 3.125 MG tablet Take 1 tablet (3.125 mg total) by mouth 2 (two) times daily. 10/18/22  Yes Laurey Morale, MD  cholecalciferol (VITAMIN D3) 25 MCG (1000 UNIT) tablet Take 1,000 Units by mouth in the morning.   Yes [provider]  Cyanocobalamin (VITAMIN B 12 PO) Take 500 mcg by mouth in the morning.   Yes [provider]  dorzolamide-timolol (COSOPT) 22.3-6.8 MG/ML ophthalmic solution Place 1 drop into the right eye 2 (two) times daily.   Yes [provider]  escitalopram (LEXAPRO) 20 MG tablet Take 1 tablet (20 mg total) by mouth daily. Patient taking differently: Take 20 mg by mouth daily in the afternoon. 06/23/19  Yes Bensimhon, Bevelyn Buckles, MD  Evolocumab [facility] SURECLICK) 140 MG/ML SOAJ INJECT 140MG  ( ) INTO SKIN EVERY 2 WEEKS  11/28/22  Yes Laurey Morale, MD  Ferrous Sulfate (IRON PO) Take 9 mg by mouth in the morning. Chewable   Yes [provider]  finasteride (PROSCAR) 5 MG tablet Take 1 tablet (5 mg total) by mouth daily. Patient taking differently: Take 5 mg by mouth every evening. 06/22/19  Yes Bensimhon, Bevelyn Buckles, MD  FOLIC ACID PO Take 1 tablet by mouth daily.   Yes [provider]  furosemide (LASIX) 20 MG tablet Take 4 tablets (80 mg total) by mouth every morning AND 2 tablets (40  mg total) every evening. 07/31/23 10/29/23 Yes Laurey Morale, MD  gabapentin (NEURONTIN) 100 MG capsule Take 200 mg by mouth 2 (two) times daily.    Yes [provider]  latanoprost (XALATAN) 0.005 % ophthalmic solution Place 1 drop into the right eye daily at 12 noon.   Yes [provider]  levothyroxine (SYNTHROID) 137 MCG tablet TAKE ONE TABLET DAILY BEFORE BREAKFAST. must make appointment FOR refills 07/11/23  Yes Nida, Denman George, MD  Polyethyl Glycol-Propyl Glycol 0.4-0.3 % SOLN Place 1 drop into the left eye in the morning and at bedtime.   Yes [provider]  Probiotic Product (PROBIOTIC PO) Take 1 capsule by mouth daily.   Yes [provider]  RHOPRESSA 0.02 % SOLN Place 1 drop into the right eye at bedtime. 10/11/21  Yes [provider]  tamsulosin (FLOMAX) 0.4 MG CAPS Take 0.4 mg by mouth at bedtime.    Yes [provider]  XARELTO 15 MG TABS tablet TAKE (1) TABLET DAILY WITH SUPPER. 02/28/23  Yes Laurey Morale, MD  empagliflozin (JARDIANCE) 10 MG TABS tablet Take 1 tablet (10 mg total) by mouth daily. 10/18/22   Laurey Morale, MD  nitroGLYCERIN (NITROSTAT) 0.4 MG SL tablet Place 0.4 mg under the tongue every 5 (five) minutes x 3 doses as needed for chest pain. 12/11/17   [provider]  sodium zirconium cyclosilicate (LOKELMA) 10 g PACK packet Take as directed by the HF Clinic 06/17/23   Jacklynn Ganong, FNP    Inpatient  Medications: Scheduled Meds:  amiodarone  200 mg Oral Daily   Chlorhexidine Gluconate Cloth  6 each Topical Daily   dorzolamide-timolol  1 drop Both Eyes BID   empagliflozin  10 mg Oral Daily   escitalopram  20 mg Oral Daily   finasteride  5 mg Oral Daily   gabapentin  200 mg Oral BID   levothyroxine  137 mcg Oral Q0600   potassium chloride  40 mEq Oral Q4H   rivaroxaban  15 mg Oral Daily   sodium chloride flush  10-40 mL Intracatheter Q12H   sodium chloride flush  3 mL Intravenous Q12H   sodium chloride flush  3 mL Intravenous Q12H   tamsulosin  0.4 mg Oral QPC supper   Continuous Infusions:  sodium chloride     sodium chloride     furosemide (LASIX) 200 mg in dextrose 5 % 100 mL (2 mg/mL) infusion 12 mg/hr (08/06/23 0521)   milrinone 0.25 mcg/kg/min (08/06/23 0518)   PRN Meds: sodium chloride, sodium chloride, acetaminophen, ondansetron (ZOFRAN) IV, sodium chloride flush, sodium chloride flush, sodium chloride flush  Allergies:    Allergies  Allergen Reactions   Pravastatin Sodium Other (See Comments)    Joint and muscle pain   Azithromycin Diarrhea    Social History:   Social History   Socioeconomic History   Marital status: Married    Spouse name: Not on file   Number of children: 3   Years of education: Not on file   Highest education level: Not on file  Occupational History   Occupation: retired  Tobacco Use   Smoking status: Former    Current packs/day: 0.00    Average packs/day: 3.0 packs/day for 48.0 years (144.0 ttl pk-yrs)    Types: Cigarettes    Start date: 09/28/1952    Quit date: 09/28/2000    Years since quitting: 22.8   Smokeless tobacco: Never  Vaping Use   Vaping status: Never Used  Substance and Sexual Activity   Alcohol use: No   Drug use: No   Sexual activity: Not Currently  Other Topics Concern   Not on file  Social History Narrative   Pt lives in [place] with spouse.  3 children are grown and healthy.   Retired.  Ran a  country store for 30 years, previously worked in the [employer] for 16 years   Social Determinants of Corporate investment banker Strain: Not on [employer] Insecurity: No Food Insecurity (08/05/2023)   Hunger Vital Sign    Worried About Running Out of Food in the Last Year: Never true    Ran Out of Food in the Last Year: Never true  Transportation Needs: No Transportation Needs (08/05/2023)   PRAPARE - Administrator, Civil Service (Medical): No    Lack of Transportation (Non-Medical): No  Physical Activity: Inactive (11/03/2020)   Exercise Vital Sign    Days of Exercise per Week: 0 days    Minutes of Exercise per Session: 0 min  Stress: Not on file  Social Connections: Not on file  Intimate Partner Violence: Not At Risk (08/05/2023)   Humiliation, Afraid, Rape, and Kick questionnaire    Fear of Current or Ex-Partner: No    Emotionally Abused: No    Physically Abused: No    Sexually Abused: No    Family History:   Family History  Problem Relation Age of Onset   Kidney disease Mother    Heart disease Mother        MI, open heart   Diabetes Mother        dialysis   Leukemia Father    Colon cancer Paternal Uncle    Lung cancer Paternal Uncle        x 2   Prostate cancer Paternal Uncle    Diabetes Maternal Grandmother    Heart attack Maternal Uncle    Diabetes Maternal Aunt        x 3   Diabetes Maternal Uncle    Thyroid disease Sister      ROS:  Please see the history of present illness.  All other ROS reviewed and negative.     Physical Exam/Data:   Vitals:   08/06/23 0104 08/06/23 0357 08/06/23 0508 08/06/23 0733  BP:  110/60  107/62  Pulse:  71  66  Resp:  19  18  Temp:  97.8 F (36.6 C)  99.9 F (37.7 C)  TempSrc:  Oral  Oral  SpO2:  (!) 89% 94% 94%  Weight: 94.8 kg 94.6 kg    Height:        Intake/Output Summary (Last 24 hours) at 08/06/2023 0911 Last data filed at 08/06/2023 9147 Gross per 24 hour  Intake 1190 ml  Output 2250 ml   Net -1060 ml      08/06/2023    3:57 AM 08/06/2023    1:04 AM 08/05/2023    2:48 PM  Last 3 Weights  Weight (lbs) 208 lb 8.9 oz 208 lb 15.9 oz 210 lb 5.1 oz  Weight (kg) 94.6 kg 94.8 kg 95.4 kg     Body mass index is 33.66 kg/m.  General:  Well nourished, well developed, in no acute distress HEENT: normal Lymph: no adenopathy Neck: no JVD Endocrine:  No thryomegaly Vascular: No carotid bruits; FA pulses 2+ bilaterally without bruits  Cardiac:  normal S1, S2; RRR; S3 gallop, very soft systolic flow murmur at apex Lungs:  clear  to auscultation bilaterally, no wheezing, rhonchi or rales  Abd: soft, nontender, no hepatomegaly  Ext: no edema Musculoskeletal:  No deformities, BUE and BLE strength normal and equal Skin: warm and dry  Neuro:  CNs 2-12 intact, no focal abnormalities noted Psych:  Normal affect   EKG:  The EKG was personally reviewed and demonstrates:  AV paced HR 79 Telemetry:  Telemetry was personally reviewed and demonstrates:  AV paced.   Cardiac Studies & Procedures   CARDIAC CATHETERIZATION  CARDIAC CATHETERIZATION 08/05/2023  Narrative 1. Elevated right and left heart filling pressures. 2. Prominent V-waves in the PCWP tracing suggestive of significant mitral regurgitation. 3. Low cardiac output, CI 1.9. 4. 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  I am going to admit for diuresis given markedly elevated PCWP.     ECHOCARDIOGRAM  ECHOCARDIOGRAM COMPLETE 07/17/2023  Narrative ECHOCARDIOGRAM REPORT    Patient Name:   KOLSTON LACOUNT Date of Exam: 07/17/2023 Medical Rec #:  [id number]        Height:       66.0 in Accession #:    [id number]       Weight:       213.8 lb Date of Birth:  01-23-1941         BSA:          2.057 m Patient Age:    82 years         BP:           133/88 mmHg Patient Gender: M                HR:           78 bpm. Exam Location:  Outpatient  Procedure: 2D Echo, 3D Echo, Cardiac Doppler, Color Doppler and  Strain Analysis  Indications:    I50.40* Unspecified combined systolic (congestive) and diastolic (congestive) heart failure  History:        Patient has prior history of Echocardiogram examinations, most recent 04/03/2023. CHF and Cardiomyopathy, Previous Myocardial Infarction and CAD, Abnormal ECG, Defibrillator and Pacemaker, Arrythmias:Atrial Fibrillation and Bradycardia; Risk Factors:Hypertension, Dyslipidemia and Sleep Apnea.  Sonographer:    Sheralyn Boatman RDCS Referring Phys: [phone] JESSICA M MILFORD  IMPRESSIONS   1. Left ventricular ejection fraction, by estimation, is 40 to 45%. The left ventricle has mildly decreased function. The left ventricle demonstrates global hypokinesis. The left ventricular internal cavity size was mildly dilated. There is mild concentric left ventricular hypertrophy. Left ventricular diastolic parameters are consistent with Grade II diastolic dysfunction (pseudonormalization). 2. Right ventricular systolic function is mildly reduced. The right ventricular size is normal. There is severely elevated pulmonary artery systolic pressure. The estimated right ventricular systolic pressure is 71.0 mmHg. 3. Left atrial size was mildly dilated. 4. Right atrial size was mildly dilated. 5. The mitral valve is abnormal. Moderate to severe mitral valve regurgitation. Mitral regurgitation may not be fully visualized. There is splay artifact noted, and ERO by PISA is 0.41 cm^2. No evidence of mitral stenosis. 6. The aortic valve is tricuspid. There is mild calcification of the aortic valve. Aortic valve regurgitation is trivial. No aortic stenosis is present. 7. The inferior vena cava is dilated in size with >50% respiratory variability, suggesting right atrial pressure of 8 mmHg.  FINDINGS Left Ventricle: Left ventricular ejection fraction, by estimation, is 40 to 45%. The left ventricle has mildly decreased function. The left ventricle demonstrates global hypokinesis. The  left ventricular internal cavity size was mildly dilated. There is mild concentric left  ventricular hypertrophy. Left ventricular diastolic parameters are consistent with Grade II diastolic dysfunction (pseudonormalization).  Right Ventricle: The right ventricular size is normal. No increase in right ventricular wall thickness. Right ventricular systolic function is mildly reduced. There is severely elevated pulmonary artery systolic pressure. The tricuspid regurgitant velocity is 3.97 m/s, and with an assumed right atrial pressure of 8 mmHg, the estimated right ventricular systolic pressure is 71.0 mmHg.  Left Atrium: Left atrial size was mildly dilated.  Right Atrium: Right atrial size was mildly dilated.  Pericardium: There is no evidence of pericardial effusion.  Mitral Valve: The mitral valve is abnormal. Mild mitral annular calcification. Moderate to severe mitral valve regurgitation. No evidence of mitral valve stenosis. MV peak gradient, 5.5 mmHg. The mean mitral valve gradient is 2.0 mmHg.  Tricuspid Valve: The tricuspid valve is normal in structure. Tricuspid valve regurgitation is mild.  Aortic Valve: The aortic valve is tricuspid. There is mild calcification of the aortic valve. Aortic valve regurgitation is trivial. No aortic stenosis is present.  Pulmonic Valve: The pulmonic valve was normal in structure. Pulmonic valve regurgitation is trivial.  Aorta: The aortic root is normal in size and structure.  Venous: The inferior vena cava is dilated in size with greater than 50% respiratory variability, suggesting right atrial pressure of 8 mmHg.  IAS/Shunts: No atrial level shunt detected by color flow Doppler.  Additional Comments: A device lead is visualized in the right ventricle.   LEFT VENTRICLE PLAX 2D LVIDd:         5.80 cm      Diastology LVIDs:         4.60 cm      LV e' medial:    4.90 cm/s LV PW:         1.20 cm      LV E/e' medial:  21.6 LV IVS:        1.40 cm       LV e' lateral:   8.92 cm/s LVOT diam:     2.00 cm      LV E/e' lateral: 11.9 LV SV:         59 LV SV Index:   29 LVOT Area:     3.14 cm  3D Volume EF: LV Volumes (MOD)            3D EF:        43 % LV vol d, MOD A2C: 100.0 ml LV EDV:       155 ml LV vol d, MOD A4C: 108.0 ml LV ESV:       88 ml LV vol s, MOD A2C: 61.2 ml  LV SV:        67 ml LV vol s, MOD A4C: 65.1 ml LV SV MOD A2C:     38.8 ml LV SV MOD A4C:     108.0 ml LV SV MOD BP:      42.4 ml  IVC IVC diam: 2.20 cm  LEFT ATRIUM             Index        RIGHT ATRIUM           Index LA diam:        5.50 cm 2.67 cm/m   RA Area:     24.50 cm LA Vol (A2C):   81.7 ml 39.71 ml/m  RA Volume:   82.40 ml  40.05 ml/m LA Vol (A4C):   57.8 ml 28.09 ml/m LA Biplane Vol: 68.5  ml 33.30 ml/m AORTIC VALVE             PULMONIC VALVE LVOT Vmax:   97.30 cm/s  PR End Diast Vel: 1.99 msec LVOT Vmean:  61.600 cm/s LVOT VTI:    0.188 m  AORTA Ao Root diam: 3.60 cm Ao Asc diam:  3.60 cm  MITRAL VALVE                  TRICUSPID VALVE MV Area (PHT): 3.99 cm       TR Peak grad:   63.0 mmHg MV Area VTI:   1.60 cm       TR Vmax:        397.00 cm/s MV Peak grad:  5.5 mmHg MV Mean grad:  2.0 mmHg       SHUNTS MV Vmax:       1.17 m/s       Systemic VTI:  0.19 m MV Vmean:      68.1 cm/s      Systemic Diam: 2.00 cm MV Decel Time: 190 msec MR Peak grad:    75.0 mmHg MR Mean grad:    50.0 mmHg MR Vmax:         433.00 cm/s MR Vmean:        336.0 cm/s MR PISA:         3.53 cm MR PISA Eff ROA: 29 mm MR PISA Radius:  0.75 cm MV E velocity: 106.00 cm/s  Dalton McleanMD Electronically signed by Wilfred Lacy Signature Date/Time: 07/17/2023/11:34:09 AM    Final   TEE  ECHO TEE 08/05/2023  Narrative TRANSESOPHOGEAL ECHO REPORT    Patient Name:   DAXON KYNE Date of Exam: 08/05/2023 Medical Rec #:  [id number]        Height:       66.0 in Accession #:    [id number]       Weight:       208.0 lb Date of Birth:  [birth year]          BSA:          2.033 m Patient Age:    82 years         BP:           117/73 mmHg Patient Gender: M                HR:           59 bpm. Exam Location:  Inpatient  Procedure: Transesophageal Echo, Cardiac Doppler, Color Doppler and 3D Echo  Indications:    I34.0 Nonrheumatic mitral (valve) insufficiency  History:        Patient has prior history of Echocardiogram examinations, most recent 07/17/2023. Cardiomyopathy and CHF, CAD, Abnormal ECG and Pacemaker, Arrythmias:Atrial Fibrillation, Signs/Symptoms:Chest Pain; Risk Factors:Hypertension, Dyslipidemia and Sleep Apnea.  Sonographer:    Sheralyn Boatman RDCS Referring Phys: [id number] Eliot Ford [facility]  PROCEDURE: After discussion of the risks and benefits of a TEE, an informed consent was obtained from the patient. The transesophogeal probe was passed without difficulty through the esophogus of the patient. Imaged were obtained with the patient in a left lateral decubitus position. Sedation performed by different physician. The patient was monitored while under deep sedation. Anesthestetic sedation was provided intravenously by Anesthesiology: 206mg  of Propofol. The patient developed no complications during the procedure.  IMPRESSIONS   1. Left ventricular ejection fraction, by estimation, is 45 to 50%. The left ventricle has mildly decreased function. The left ventricle demonstrates global  hypokinesis. 2. Peak RV-RA gradient 51 mmHg. Right ventricular systolic function is mildly reduced. The right ventricular size is normal. There is severely elevated pulmonary artery systolic pressure. 3. Left atrial size was mildly dilated. No left atrial/left atrial appendage thrombus was detected. 4. The mitral valve is abnormal. There is restriction of the posterior leaflet predominantly in the P1/P2 region with several (looks like 3 predominant) jets of mitral regurgitation. PISA interrogation of the largest jet gives ERO 0.58 cm^2. 3-D vena contracta area  0.73 cm^2 incorporating all jets. There appeared to be slight systolic flow reversal in the left upper pulmonary vein doppler signal but not in the right pulmonary veins. Severe mitral valve regurgitation. No evidence of mitral stenosis. The mean mitral valve gradient is 1.0 mmHg. 5. The tricuspid valve is abnormal. Tricuspid valve regurgitation is moderate to severe. 6. The aortic valve is tricuspid. Aortic valve regurgitation is trivial. No aortic stenosis is present.  FINDINGS Left Ventricle: Left ventricular ejection fraction, by estimation, is 45 to 50%. The left ventricle has mildly decreased function. The left ventricle demonstrates global hypokinesis. The left ventricular internal cavity size was normal in size.  Right Ventricle: Peak RV-RA gradient 51 mmHg. The right ventricular size is normal. No increase in right ventricular wall thickness. Right ventricular systolic function is mildly reduced. There is severely elevated pulmonary artery systolic pressure.  Left Atrium: Left atrial size was mildly dilated. No left atrial/left atrial appendage thrombus was detected.  Right Atrium: Right atrial size was normal in size.  Pericardium: There is no evidence of pericardial effusion.  Mitral Valve: There is restriction of the posterior leaflet predominantly in the P1/P2 region with several (looks like 3 predominant) jets of mitral regurgitation. PISA interrogation of the largest jet gives ERO 0.58 cm^2. 3-D vena contracta area 0.73 cm^2 incorporating all jets. There appeared to be slight systolic flow reversal in the left upper pulmonary vein doppler signal but not in the right pulmonary veins. The mitral valve is abnormal. Severe mitral valve regurgitation. No evidence of mitral valve stenosis. MV peak gradient, 2.5 mmHg. The mean mitral valve gradient is 1.0 mmHg.  Tricuspid Valve: The tricuspid valve is abnormal. Tricuspid valve regurgitation is moderate to severe.  Aortic Valve: The  aortic valve is tricuspid. Aortic valve regurgitation is trivial. No aortic stenosis is present.  Pulmonic Valve: The pulmonic valve was normal in structure. Pulmonic valve regurgitation is trivial. No evidence of pulmonic stenosis.  Aorta: The aortic root is normal in size and structure.  IAS/Shunts: No atrial level shunt detected by color flow Doppler.  Additional Comments: A device lead is visualized in the right ventricle. Spectral Doppler performed.  MITRAL VALVE                  TRICUSPID VALVE MV Peak grad: 2.5 mmHg        TR Peak grad:   51.6 mmHg MV Mean grad: 1.0 mmHg        TR Vmax:        359.00 cm/s MV Vmax:      0.79 m/s MV Vmean:     47.3 cm/s MR Peak grad:    78.9 mmHg MR Mean grad:    52.0 mmHg MR Vmax:         444.00 cm/s MR Vmean:        342.0 cm/s MR PISA:         7.60 cm MR PISA Eff ROA: 58 mm MR PISA Radius:  1.10 cm  Dalton McleanMD Electronically signed by Wilfred Lacy Signature Date/Time: 08/13/2023/4:57:03 PM    Final     CARDIAC MRI  MR CARDIAC MORPHOLOGY W WO CONTRAST 02/21/2015  Narrative CLINICAL DATA:  Cardiomyopathy Assess EF  EXAM: CARDIAC MRI  TECHNIQUE: The patient was scanned on a 1.5 Tesla GE magnet. A dedicated cardiac coil was used. Functional imaging was done using Fiesta sequences. 2,3, and 4 chamber views were done to assess for RWMA's. Modified Simpson's rule using a short axis stack was used to calculate an ejection fraction on a dedicated work [employer]. The patient received 30 cc of Multihance. After 10 minutes inversion recovery sequences were used to assess for infiltration and scar tissue.  CONTRAST:  30 cc Multihance  FINDINGS: There was moderate LAE. RA and RV were normal in size and function There was no ASD/VSD. No pericardial effusion. The ascending aorta was normal at 3.2 cm Aortic valve trileaflet with mild calcification and systolic turbulence. Mild AR. Mitral valve structurally  normal. The LV was mildly dilated. There was diffuse hypokinesis worse in the anterolateral wall. The quantitative EF was 34% (EDV 166 ESV 109 SV 57 cc) Delayed enhancement images showed small punctate areas of enhancement in anterior septum and basal inferior wall  IMPRESSION: 1) Mild LVE global hypokinesis worse in the anterolateral wall EF 34%  2) Small punctate areas of hyperenhancement on delayed gadolinium images in anteroseptum and basal inferior wall  3) Moderate LAE  4) AV sclerosis with mild AR  5) No LAA thrombus  Charlton Haws   Electronically Signed By: Charlton Haws M.D. On: 02/21/2015 10:18         Laboratory Data:  High Sensitivity Troponin:  No results for input(s): "TROPONINIHS" in the last 720 hours.   Chemistry Recent Labs  Lab 2023/08/13 1655 08/06/23 0457  NA 142 138  K 3.3* 3.3*  CL 106 103  CO2 22 25  GLUCOSE 120* 128*  BUN 28* 33*  CREATININE 2.17* 2.43*  CALCIUM 8.5* 7.7*  GFRNONAA 30* 26*  ANIONGAP 14 10    Recent Labs  Lab 2023-08-13 1655  PROT 7.3  ALBUMIN 3.3*  AST 18  ALT 19  ALKPHOS 55  BILITOT 0.6   Hematology Recent Labs  Lab 08/13/23 1655 08/06/23 0457  WBC 6.2 7.6  RBC 3.73* 3.28*  HGB 11.5* 10.1*  HCT 36.7* 32.0*  MCV 98.4 97.6  MCH 30.8 30.8  MCHC 31.3 31.6  RDW 14.9 15.0  PLT 148* 141*   BNP Recent Labs  Lab Aug 13, 2023 1655  BNP 322.3*    DDimer No results for input(s): "DDIMER" in the last 168 hours.   Radiology/Studies:  ECHO TEE  Result Date: 13-Aug-2023    TRANSESOPHOGEAL ECHO REPORT   Patient Name:   JAMERSON VONBARGEN Date of Exam: August 13, 2023 Medical Rec #:  [id number]        Height:       66.0 in Accession #:    [id number]       Weight:       208.0 lb Date of Birth:  [birth year]         BSA:          2.033 m Patient Age:    82 years         BP:           117/73 mmHg Patient Gender: M                HR:  59 bpm. Exam Location:  Inpatient Procedure: Transesophageal Echo, Cardiac Doppler, Color  Doppler and 3D Echo Indications:    I34.0 Nonrheumatic mitral (valve) insufficiency  History:        Patient has prior history of Echocardiogram examinations, most                 recent 07/17/2023. Cardiomyopathy and CHF, CAD, Abnormal ECG and                 Pacemaker, Arrythmias:Atrial Fibrillation, Signs/Symptoms:Chest                 Pain; Risk Factors:Hypertension, Dyslipidemia and Sleep Apnea.  Sonographer:    Sheralyn Boatman RDCS Referring Phys: [id number] [facility] PROCEDURE: After discussion of the risks and benefits of a TEE, an informed consent was obtained from the patient. The transesophogeal probe was passed without difficulty through the esophogus of the patient. Imaged were obtained with the patient in a left lateral decubitus position. Sedation performed by different physician. The patient was monitored while under deep sedation. Anesthestetic sedation was provided intravenously by Anesthesiology: 206mg  of Propofol. The patient developed no complications during the procedure.  IMPRESSIONS  1. Left ventricular ejection fraction, by estimation, is 45 to 50%. The left ventricle has mildly decreased function. The left ventricle demonstrates global hypokinesis.  2. Peak RV-RA gradient 51 mmHg. Right ventricular systolic function is mildly reduced. The right ventricular size is normal. There is severely elevated pulmonary artery systolic pressure.  3. Left atrial size was mildly dilated. No left atrial/left atrial appendage thrombus was detected.  4. The mitral valve is abnormal. There is restriction of the posterior leaflet predominantly in the P1/P2 region with several (looks like 3 predominant) jets of mitral regurgitation. PISA interrogation of the largest jet gives ERO 0.58 cm^2. 3-D vena contracta area 0.73 cm^2 incorporating all jets. There appeared to be slight systolic flow reversal in the left upper pulmonary vein doppler signal but not in the right pulmonary veins. Severe mitral valve regurgitation.  No evidence of mitral stenosis. The mean mitral valve gradient is 1.0 mmHg.  5. The tricuspid valve is abnormal. Tricuspid valve regurgitation is moderate to severe.  6. The aortic valve is tricuspid. Aortic valve regurgitation is trivial. No aortic stenosis is present. FINDINGS  Left Ventricle: Left ventricular ejection fraction, by estimation, is 45 to 50%. The left ventricle has mildly decreased function. The left ventricle demonstrates global hypokinesis. The left ventricular internal cavity size was normal in size. Right Ventricle: Peak RV-RA gradient 51 mmHg. The right ventricular size is normal. No increase in right ventricular wall thickness. Right ventricular systolic function is mildly reduced. There is severely elevated pulmonary artery systolic pressure. Left Atrium: Left atrial size was mildly dilated. No left atrial/left atrial appendage thrombus was detected. Right Atrium: Right atrial size was normal in size. Pericardium: There is no evidence of pericardial effusion. Mitral Valve: There is restriction of the posterior leaflet predominantly in the P1/P2 region with several (looks like 3 predominant) jets of mitral regurgitation. PISA interrogation of the largest jet gives ERO 0.58 cm^2. 3-D vena contracta area 0.73 cm^2 incorporating all jets. There appeared to be slight systolic flow reversal in the left upper pulmonary vein doppler signal but not in the right pulmonary veins. The mitral valve is abnormal. Severe mitral valve regurgitation. No evidence of mitral valve stenosis. MV peak gradient, 2.5 mmHg. The mean mitral valve gradient is 1.0 mmHg. Tricuspid Valve: The tricuspid valve is abnormal. Tricuspid valve regurgitation  is moderate to severe. Aortic Valve: The aortic valve is tricuspid. Aortic valve regurgitation is trivial. No aortic stenosis is present. Pulmonic Valve: The pulmonic valve was normal in structure. Pulmonic valve regurgitation is trivial. No evidence of pulmonic stenosis.  Aorta: The aortic root is normal in size and structure. IAS/Shunts: No atrial level shunt detected by color flow Doppler. Additional Comments: A device lead is visualized in the right ventricle. Spectral Doppler performed. MITRAL VALVE                  TRICUSPID VALVE MV Peak grad: 2.5 mmHg        TR Peak grad:   51.6 mmHg MV Mean grad: 1.0 mmHg        TR Vmax:        359.00 cm/s MV Vmax:      0.79 m/s MV Vmean:     47.3 cm/s MR Peak grad:    78.9 mmHg MR Mean grad:    52.0 mmHg MR Vmax:         444.00 cm/s MR Vmean:        342.0 cm/s MR PISA:         7.60 cm MR PISA Eff ROA: 58 mm MR PISA Radius:  1.10 cm Dalton McleanMD Electronically signed by Wilfred Lacy Signature Date/Time: 08/05/2023/4:57:03 PM    Final    EP STUDY  Result Date: 08/05/2023 See surgical note for result.  [place] EKG SITE RITE  Result Date: 08/05/2023 If Site Rite image not attached, placement could not be confirmed due to current cardiac rhythm.  CARDIAC CATHETERIZATION  Result Date: 08/05/2023 1. Elevated right and left heart filling pressures. 2. Prominent V-waves in the PCWP tracing suggestive of significant mitral regurgitation. 3. Low cardiac output, CI 1.9. 4. 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 I am going to admit for diuresis given markedly elevated PCWP.     Assessment and Plan:    STS Risk Calculator:  MVRepair/replacement.   Procedure Type: Isolated MVr Perioperative Outcome Estimate % Operative Mortality 10.1% Morbidity & Mortality 28% Stroke 2.29% Renal Failure 31.4% Reoperation 5.59% Prolonged Ventilation 19.7% Deep Sternal Wound Infection 0.112% Long Hospital Stay (>14 days) 18.2% Short Hospital Stay (<6 days)* 13.6%    Procedure Type: Isolated MVR Perioperative Outcome Estimate % Operative Mortality 14.2% Morbidity & Mortality 29.2% Stroke 1.91% Renal Failure 22.2% Reoperation 5.99% Prolonged Ventilation 17.7% Deep Sternal Wound Infection 0.17% Long Hospital  Stay (>14 days) 19.3% Short Hospital Stay (<6 days)* 9.84%  _______________________________   [facility] Cardiomyopathy Questionnaire     08/05/2023    3:44 PM  KCCQ-12  1 a. Ability to shower/bathe Slightly limited  1 b. Ability to walk 1 block Extremely limited  1 c. Ability to hurry/jog Other, Did not do  2. Edema feet/ankles/legs Never over the past 2 weeks  3. Limited by fatigue All of the time  4. Limited by dyspnea All of the time  5. Sitting up / on 3+ pillows Never over the past 2 weeks  6. Limited enjoyment of life Extremely limited  7. Rest of life w/ symptoms Not at all satisfied  8 a. Participation in hobbies Limited quite a bit  8 b. Participation in chores Limited quite a bit  8 c. Visiting family/friends Limited quite a bit      Assessment and Plan:   Chad Brown is a 82 y.o. male with symptoms of severe functional mitral regurgitation with NYHA Class III symptoms currently admitted for acute  on chronic HFrEF. TEE 08/05/23 showed EF 45-50% and severe functional MR with 3 adjacent jets, primarily originating from restricted P1/P2 portion of the mitral valve: ERO 0.55 cm^2 from largest jet. RHC 08/05/23 showed low CO (CI 1.9) and significantly elevated right and left heart filling pressures and he was admitted for volume optimization with Lasix gtt + milrinone. Plan is to repeat echo once he is diuresed.   I have reviewed the natural history of mitral regurgitation with the patient. We have discussed the limitations of medical therapy and the poor prognosis associated with symptomatic mitral regurgitation. We have also reviewed potential treatment options, including palliative medical therapy, conventional surgical mitral valve repair or replacement, and percutaneous mitral valve repair with MitraClip. We discussed treatment options in the context of this patient's specific comorbid medical conditions.    The patient's predicted risk of mortality with conventional  mitral valve replacement/repair is 10.1 % / 14.2 % respectively,  primarily based on morbid obesity, anemia, OSA on CPAP, PAD, chronic HFrEF, PAF, CKD stage IIIb, and HTN. Other significant comorbid conditions physical deconditioning.   He is currently diuresing and net negative ~1.6L. Weight is stable ~208 lbs. CVP down to 8-9 with diuresis. Creat 2.43 which is around his baseline.  Will follow up on echo after diuresis. If MR remains severe, he may be a candidate for mTEER.   Dr. Lynnette Caffey to follow.    For questions or updates, please contact [facility] Please consult [email or web address] for contact info under    Signed, Cline Crock, PA-C  08/06/2023 9:11 AM   ATTENDING ATTESTATION:  After conducting a review of all available clinical information with the care team, interviewing the patient, and performing a physical exam, I agree with the findings and plan described in this note.   GEN: No acute distress.   HEENT:  MMM, JVD ~4cm, no scleral icterus Cardiac: RRR, soft systolic murmur Respiratory: Clear to auscultation bilaterally. GI: Soft, nontender, distended MS: No edema; No deformity.  Warm Neuro:  Nonfocal  Vasc:  +2 radial pulses  The patient is a very pleasant 82 year old male with a longstanding history of atrial fibrillation.  He is under gone multiple TEE cardioversions and finally ablation procedure in 2021.  His ejection fraction had been severely diminished and most recently is about 45 to 50%.  He does have a CRT-D device in place.  He has noticed increasing shortness of breath, weight gain, and early satiety.  Because of these symptoms his diuretics were intensified recently.  He was referred for a TEE and right heart catheterization.  I did review his TEE study which demonstrates at moderate and likely severe mitral regurgitation that looks to be atrial functional in nature.  His right heart catheterization data demonstrated a mean RA pressure of 13 mmHg and a  mean wedge pressure of 30 mmHg with V waves to 50 mmHg.  His cardiac output and index were moderately diminished.  He was started on milrinone and Lasix drip.  He has diuresed well over the last day or so and feels much improved.  I do agree that the patient has significant atrial functional mitral regurgitation.  While I am hopeful that inotrope aided diuresis will improve the situation I think this is doubtful.  Once he is rendered euvolemic will obtain another TTE to ensure that this mitral regurgitation is still significant.  If this is the case then I think the best option is to consider a mitral transcatheter edge-to-edge repair.  It looks  like he has a noncoaptation somewhat lateral to the P2 scallop.  We will have to see whether the patient can be discharged and return for the procedure, which would be preferable.  Alverda Skeans, MD Pager [phone]

## 2023-08-05 NOTE — Progress Notes (Signed)
  Echocardiogram Echocardiogram Transesophageal has been performed.  Chad Brown 08/05/2023, 10:49 AM

## 2023-08-05 NOTE — Plan of Care (Signed)
  Problem: Cardiovascular: Goal: Vascular access site(s) Level 0-1 will be maintained Outcome: Progressing   Problem: Clinical Measurements: Goal: Cardiovascular complication will be avoided Outcome: Progressing   Problem: Activity: Goal: Risk for activity intolerance will decrease Outcome: Progressing

## 2023-08-05 NOTE — Interval H&P Note (Signed)
History and Physical Interval Note:  08/05/2023 9:57 AM  Chad Brown  has presented today for surgery, with the diagnosis of mitral regurgitation.  The various methods of treatment have been discussed with the patient and family. After consideration of risks, benefits and other options for treatment, the patient has consented to  Procedure(s): TRANSESOPHAGEAL ECHOCARDIOGRAM (N/A) as a surgical intervention.  The patient's history has been reviewed, patient examined, no change in status, stable for surgery.  I have reviewed the patient's chart and labs.  Questions were answered to the patient's satisfaction.     Rolly Magri [employer]

## 2023-08-05 NOTE — H&P (Signed)
Advanced Heart Failure Team History and Physical Note   PCP:  Cleatis Polka., MD  PCP-Cardiology: Charlton Haws, MD     Reason for Admission: CHF/severe mitral regurgitation.    HPI:    82 y.o. with paroxysmal atrial fibrillation and chronic systolic CHF thought to be due to nonischemic cardiomyopathy presents for followup of CHF. He has a cardiac history dating back to 1998, when he was admitted with chest pain and had cardiac cath showing nonobstructive CAD. In 4/14, he was found to be in atrial fibrillation.  Echo showed EF 30-35%.  He had DCCV back to NSR.  He was back in atrial fibrillation in 1/16, and EF was low again on TEE at that time.   Again, he had DCCV.  In 5/16, cardiac MRI showed persistently low EF, so given LBBB, he had Medtronic CRT-D device placed.  He was back in atrial fibrillation in 6/16 and had TEE-guided DCCV again with EF now 15-20% on TEE.  Repeat echo in 2/17 showed EF 55-60%. Echo in 1/19 showed EF 45-50%, mild LV and RV dilation.  Echo in 1/20 showed EF 45-50%, diffuse hypokinesis.    Atrial fibrillation ablation was done in 2/21.   Echo in 2/21 showed EF 40-45%, PASP 50 mmHg, mild MR. Echo in 6/22 also showed EF 40-45%.    Patient was admitted in 1/24 after a syncopal episode.  He had gone to the gym and exercised and was standing talking to someone when he got lightheaded and passed out.  No arrhythmia on device interrogation.  He was hypotensive and orthostatic in the ER. Coreg and Entresto were stopped and Lasix was decreased to 20 mg bid.     Spironolactone was stopped due to hyperkalemia.    Echo was done in 10/24 showing EF 40-45%, mild LV dilation, mildly decreased RV systolic function, moderate-severe MR with splay artifact and PISA ERO 0.44 cm^2, PASP 71 mmHg, dilated IVC.     Patient has been more short of breath recently and weight was trending up.  Lasix was increased to 80 qam/40 qpm.  He has had orthopnea and sleeps with the head of his bed  elevated.  Short of breath walking up inclines/stairs or walking to mailbox.  Generally comfortable around the house.  TEE was done today to assess mitral regurgitation.  MR looks severe with LV EF in the 45-50% range.  RHC was done today as below, showing low CO and significantly elevated right and left heart filling pressures.  I am going to admit the patient for volume optimization, will use Lasix gtt + milrinone.  Will repeat echo once he is diuresed.  Structural heart service will see him, and we will consider an inpatient Mitraclip if we can get him optimized and MR remains severe.   RHC Procedural Findings: Hemodynamics (mmHg) RA mean 13 RV 78/21 PA 75/29, mean 47 PCWP mean 30 with prominent V-waves to 49 Oxygen saturations: PA 49% AO 94% Cardiac Output (Fick) 3.87  Cardiac Index (Fick) 1.91 PVR 4.4 WU PAPi 3.5   Review of Systems: All systems reviewed and negative except as per HPI.    Home Medications Prior to Admission medications   Medication Sig Start Date End Date Taking? Authorizing Provider  acetaminophen (TYLENOL) 500 MG tablet Take 500-1,000 mg by mouth every 6 (six) hours as needed.   Yes [provider]  amiodarone (PACERONE) 200 MG tablet Take 1 tablet (200 mg total) by mouth daily. 02/20/23  Yes Lake Bells  J, PA-C  calcium carbonate (TUMS - DOSED IN MG ELEMENTAL CALCIUM) 500 MG chewable tablet Chew 2 tablets by mouth 2 (two) times daily.   Yes [provider]  carvedilol (COREG) 3.125 MG tablet Take 1 tablet (3.125 mg total) by mouth 2 (two) times daily. 10/18/22  Yes Laurey Morale, MD  cholecalciferol (VITAMIN D3) 25 MCG (1000 UNIT) tablet Take 1,000 Units by mouth in the morning.   Yes [provider]  Cyanocobalamin (VITAMIN B 12 PO) Take 500 mcg by mouth in the morning.   Yes [provider]  dorzolamide-timolol (COSOPT) 22.3-6.8 MG/ML ophthalmic solution Place 1 drop into the right eye 2 (two) times daily.   Yes [provider]  escitalopram (LEXAPRO) 20 MG tablet Take 1 tablet (20 mg total) by mouth daily. Patient taking differently: Take 20 mg by mouth daily in the afternoon. 06/23/19  Yes Bensimhon, Bevelyn Buckles, MD  Evolocumab [facility] SURECLICK) 140 MG/ML SOAJ INJECT 140MG  ( ) INTO SKIN EVERY 2 WEEKS 11/28/22  Yes Laurey Morale, MD  Ferrous Sulfate (IRON PO) Take 9 mg by mouth in the morning. Chewable   Yes [provider]  finasteride (PROSCAR) 5 MG tablet Take 1 tablet (5 mg total) by mouth daily. Patient taking differently: Take 5 mg by mouth every evening. 06/22/19  Yes Bensimhon, Bevelyn Buckles, MD  FOLIC ACID PO Take 1 tablet by mouth daily.   Yes [provider]  furosemide (LASIX) 20 MG tablet Take 4 tablets (80 mg total) by mouth every morning AND 2 tablets (40 mg total) every evening. 07/31/23 10/29/23 Yes Laurey Morale, MD  gabapentin (NEURONTIN) 100 MG capsule Take 200 mg by mouth 2 (two) times daily.    Yes [provider]  latanoprost (XALATAN) 0.005 % ophthalmic solution Place 1 drop into the right eye daily at 12 noon.   Yes [provider]  levothyroxine (SYNTHROID) 137 MCG tablet TAKE ONE TABLET DAILY BEFORE BREAKFAST. must make appointment FOR refills 07/11/23  Yes Nida, Denman George, MD  Polyethyl Glycol-Propyl Glycol 0.4-0.3 % SOLN Place 1 drop into the left eye in the morning and at bedtime.   Yes [provider]  Probiotic Product (PROBIOTIC PO) Take 1 capsule by mouth daily.   Yes [provider]  RHOPRESSA 0.02 % SOLN Place 1 drop into the right eye at bedtime. 10/11/21  Yes [provider]  tamsulosin (FLOMAX) 0.4 MG CAPS Take 0.4 mg by mouth at bedtime.    Yes [provider]  XARELTO 15 MG TABS tablet TAKE (1) TABLET DAILY WITH SUPPER. 02/28/23  Yes Laurey Morale, MD  empagliflozin (JARDIANCE) 10 MG TABS tablet Take 1 tablet (10 mg total) by mouth daily. 10/18/22   Laurey Morale, MD  nitroGLYCERIN  (NITROSTAT) 0.4 MG SL tablet Place 0.4 mg under the tongue every 5 (five) minutes x 3 doses as needed for chest pain. 12/11/17   [provider]  sodium zirconium cyclosilicate (LOKELMA) 10 g PACK packet Take as directed by the HF Clinic 06/17/23   Jacklynn Ganong, FNP    Past Medical History: Past Medical History:  Diagnosis Date   Adenomatous colon polyp    tubular   AICD (automatic cardioverter/defibrillator) present 03/08/2015   MDT CRTD dual pacemaker and defib   Anemia    iron deficient   Arthritis    "about all my joints; hands, knees, back" (03/08/2015)   Atherosclerosis    Cataract    left eye small  Cholelithiasis    gallstones   Chronic systolic CHF (congestive heart failure) (HCC)    a. New dx 12/2012 ? NICM, may be r/t afib. b. Nuc 03/2013 - normal;  c. 03/2015 TEE EF 15-20%.   Colon polyp, hyperplastic 01/2015   removed precancerous lesions   Depression    Diverticulosis    Dysrhythmia    afib   GERD (gastroesophageal reflux disease)    Glaucoma    right eye   Hyperlipidemia    Hypertension    Melanoma of eye (HCC) 2000's   "right; it's never been biopsied"   Melanoma of lower leg (HCC) 2015   "left; right at my knee"   Myocardial infarction (HCC) 1998   OSA (obstructive sleep apnea) 01/04/2016   no longer tolerates cpap   Peripheral vision loss, right 2006   Persistent atrial fibrillation (HCC)    a. Dx 12/2012, s/p TEE/DCCV 01/26/13. b. On Xarelto (CHA2DS2VASc = 3);  c. 03/2015 TEE (EF 15-20%, no LAA thrombus) and DCCV - amio increased to 200 mg bid.   Pneumonia    Urinary hesitancy due to benign prostatic hypertrophy     Past Surgical History: Past Surgical History:  Procedure Laterality Date   ATRIAL FIBRILLATION ABLATION N/A 11/03/2019   Procedure: ATRIAL FIBRILLATION ABLATION;  Surgeon: Hillis Range, MD;  Location: [facility] INVASIVE CV LAB;  Service: Cardiovascular;  Laterality: N/A;   BACK SURGERY     upper back, cannot turn neck well   CARDIAC  CATHETERIZATION  1998   CARDIOVERSION N/A 01/26/2013   Procedure: CARDIOVERSION;  Surgeon: Lewayne Bunting, MD;  Location: [facility] ENDOSCOPY;  Service: Cardiovascular;  Laterality: N/A;   CARDIOVERSION N/A 03/23/2015   Procedure: CARDIOVERSION;  Surgeon: Jake Bathe, MD;  Location: [facility] ENDOSCOPY;  Service: Cardiovascular;  Laterality: N/A;   CARDIOVERSION N/A 08/14/2017   Procedure: CARDIOVERSION;  Surgeon: Wendall Stade, MD;  Location: [facility] ENDOSCOPY;  Service: Cardiovascular;  Laterality: N/A;   CATARACT EXTRACTION Right ~ 2006   COLONOSCOPY WITH PROPOFOL N/A 02/10/2015   Procedure: COLONOSCOPY WITH PROPOFOL;  Surgeon: Iva Boop, MD;  Location: [facility] ENDOSCOPY;  Service: Endoscopy;  Laterality: N/A;   COLONOSCOPY WITH PROPOFOL N/A 08/07/2016   Procedure: COLONOSCOPY WITH PROPOFOL;  Surgeon: Iva Boop, MD;  Location: [facility] ENDOSCOPY;  Service: Endoscopy;  Laterality: N/A;   ENTEROSCOPY N/A 08/17/2015   Procedure: ENTEROSCOPY;  Surgeon: Iva Boop, MD;  Location: [facility] ENDOSCOPY;  Service: Endoscopy;  Laterality: N/A;   EP IMPLANTABLE DEVICE N/A 03/08/2015   MDT Ovidio Kin CRT-D for nonischemic CM by Dr Johney Frame for primary prevention   GLAUCOMA SURGERY Right ~ 2006   "put 3 stents in to drain fluid" (03/08/2015) not successful, sent to [facility] to try to get last stent out   HOT HEMOSTASIS N/A 08/07/2016   Procedure: HOT HEMOSTASIS (ARGON PLASMA COAGULATION/BICAP);  Surgeon: Iva Boop, MD;  Location: [facility] ENDOSCOPY;  Service: Endoscopy;  Laterality: N/A;   INCISION AND DRAINAGE ABSCESS POSTERIOR CERVICALSPINE  05/2012   JOINT REPLACEMENT     MELANOMA EXCISION Left 2015   "lower leg; right at my knee"   REFRACTIVE SURGERY Right ~ 2006 X 2   "twice; both done at [facility]" (03/08/2015   SURGERY SCROTAL / TESTICULAR Right 1990's   TEE WITHOUT CARDIOVERSION N/A 01/26/2013   Procedure: TRANSESOPHAGEAL ECHOCARDIOGRAM (TEE);  Surgeon: Lewayne Bunting, MD;  Location: [facility] ENDOSCOPY;  Service: Cardiovascular;   Laterality: N/A;  [name] anes. /    TEE WITHOUT CARDIOVERSION  N/A 10/05/2014   Procedure: TRANSESOPHAGEAL ECHOCARDIOGRAM (TEE)  with cardioversion;  Surgeon: Vesta Mixer, MD;  Location: [facility] ENDOSCOPY;  Service: Cardiovascular;  Laterality: N/A;  12:52 synched cardioversion at 120 joules,...afib to SR...12 lead EKG ordered.Marland KitchenMarland KitchenCardiozem d/c'ed per MD verbal order at SR   TEE WITHOUT CARDIOVERSION N/A 03/23/2015   Procedure: TRANSESOPHAGEAL ECHOCARDIOGRAM (TEE);  Surgeon: Jake Bathe, MD;  Location: [facility] ENDOSCOPY;  Service: Cardiovascular;  Laterality: N/A;   TEE WITHOUT CARDIOVERSION N/A 11/02/2019   Procedure: TRANSESOPHAGEAL ECHOCARDIOGRAM (TEE);  Surgeon: Wendall Stade, MD;  Location: [facility] ENDOSCOPY;  Service: Cardiovascular;  Laterality: N/A;   THORACIC SPINE SURGERY  03/2000   "ground calcium deposits from upper thoracic" (01/26/2013)   THYROIDECTOMY N/A 05/11/2021   Procedure: TOTAL THYROIDECTOMY;  Surgeon: Darnell Level, MD;  Location: [facility] ORS;  Service: General;  Laterality: N/A;   TOTAL HIP ARTHROPLASTY Right 06/2007    Family History:  Family History  Problem Relation Age of Onset   Kidney disease Mother    Heart disease Mother        MI, open heart   Diabetes Mother        dialysis   Leukemia Father    Colon cancer Paternal Uncle    Lung cancer Paternal Uncle        x 2   Prostate cancer Paternal Uncle    Diabetes Maternal Grandmother    Heart attack Maternal Uncle    Diabetes Maternal Aunt        x 3   Diabetes Maternal Uncle    Thyroid disease Sister     Social History: Social History   Socioeconomic History   Marital status: Married    Spouse name: Not on file   Number of children: 3   Years of education: Not on file   Highest education level: Not on file  Occupational History   Occupation: retired  Tobacco Use   Smoking status: Former    Current packs/day: 0.00    Average packs/day: 3.0 packs/day for 48.0 years (144.0 ttl pk-yrs)    Types: Cigarettes    Start  date: 09/28/1952    Quit date: 09/28/2000    Years since quitting: 22.8   Smokeless tobacco: Never  Vaping Use   Vaping status: Never Used  Substance and Sexual Activity   Alcohol use: No   Drug use: No   Sexual activity: Not Currently  Other Topics Concern   Not on file  Social History Narrative   Pt lives in [place] with spouse.  3 children are grown and healthy.   Retired.  Ran a country store for 30 years, previously worked in the [employer] for 16 years   Social Determinants of Corporate investment banker Strain: Not on [employer] Insecurity: Not on file  Transportation Needs: No Transportation Needs (11/03/2020)   PRAPARE - Administrator, Civil Service (Medical): No    Lack of Transportation (Non-Medical): No  Physical Activity: Inactive (11/03/2020)   Exercise Vital Sign    Days of Exercise per Week: 0 days    Minutes of Exercise per Session: 0 min  Stress: Not on file  Social Connections: Not on file    Allergies:  Allergies  Allergen Reactions   Pravastatin Sodium Other (See Comments)    Joint and muscle pain   Azithromycin Diarrhea    Objective:    Vital Signs:   Temp:  [97.4 F (36.3 C)-98.1 F (36.7 C)] 98.1 F (36.7  C) (11/04 1045) Pulse Rate:  [60-85] 60 (11/04 1137) Resp:  [12-21] 14 (11/04 1137) BP: (95-136)/(54-83) 129/76 (11/04 1137) SpO2:  [91 %-99 %] 98 % (11/04 1127) Weight:  [94.3 kg] 94.3 kg (11/04 0802)   Filed Weights   08/05/23 0802  Weight: 94.3 kg     Physical Exam     General:  Well appearing. No respiratory difficulty HEENT: Normal Neck: Supple. JVP 12-14 cm. Carotids 2+ bilat; no bruits. No lymphadenopathy or thyromegaly appreciated. Cor: PMI nondisplaced. Regular rate & rhythm. 3/6 HSM apex.  Lungs: Clear Abdomen: Soft, nontender, nondistended. No hepatosplenomegaly. No bruits or masses. Good bowel sounds. Extremities: No cyanosis, clubbing, rash. 1+ ankle edema.  Neuro: Alert & oriented x 3,  cranial nerves grossly intact. moves all 4 extremities w/o difficulty. Affect pleasant.   Telemetry   NSR with BiV pacing (personally reviewed)   Labs     Basic Metabolic Panel: No results for input(s): "NA", "K", "CL", "CO2", "GLUCOSE", "BUN", "CREATININE", "CALCIUM", "MG", "PHOS" in the last 168 hours.  Liver Function Tests: No results for input(s): "AST", "ALT", "ALKPHOS", "BILITOT", "PROT", "ALBUMIN" in the last 168 hours. No results for input(s): "LIPASE", "AMYLASE" in the last 168 hours. No results for input(s): "AMMONIA" in the last 168 hours.  CBC: No results for input(s): "WBC", "NEUTROABS", "HGB", "HCT", "MCV", "PLT" in the last 168 hours.  Cardiac Enzymes: No results for input(s): "CKTOTAL", "CKMB", "CKMBINDEX", "TROPONINI" in the last 168 hours.  BNP: BNP (last 3 results) Recent Labs    10/18/22 0949 07/17/23 1227  BNP 266.3* 410.5*    ProBNP (last 3 results) No results for input(s): "PROBNP" in the last 8760 hours.   CBG: No results for input(s): "GLUCAP" in the last 168 hours.  Coagulation Studies: No results for input(s): "LABPROT", "INR" in the last 72 hours.  Imaging: CARDIAC CATHETERIZATION  Result Date: 08/05/2023 1. Elevated right and left heart filling pressures. 2. Prominent V-waves in the PCWP tracing suggestive of significant mitral regurgitation. 3. Low cardiac output, CI 1.9. 4. 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 I am going to admit for diuresis given markedly elevated PCWP.     Assessment/Plan   1. Acute on chronic HF with mid-range EF: Nonischemic cardiomyopathy.  He has Medtronic CRT-D device.  EF 15-20% on 6/16 TEE.  Cardiac MRI in 5/16 showed a LGE pattern that was not suggestive of coronary disease (?myocarditis or infiltrative disease).  There may be a component of tachycardia-mediated cardiomyopathy.  However, the cardiac MRI was done when he was in NSR.  In 9/16, echo showed EF increased to 40%.  Echo  in 1/20 showed EF 45-50%.  Echo in 10/24 showed EF 40-45%, mild LV dilation, mildly decreased RV systolic function, moderate-severe MR with splay artifact and PISA ERO 0.44 cm^2, PASP 71 mmHg, dilated IVC.  The mitral regurgitation looked severe and appeared new compared to prior echo.  NYHA class III symptoms (worsened) and volume overloaded by exam and Optivol at last appointment.  Mitral regurgitation may be leading to worsening CHF.  GDMT has been limited in past by orthostatic symptoms and CKD.  He was brought in today for TEE to assess MR and RHC.  TEE confirmed severe functional MR with LV EF 45-50% and RHC showed markedly elevated PCWP and elevated RA pressure with mixed pulmonary venous/pulmonary arterial hypertension and low CO (CI 1.9).  I am going to admit him for diuresis/volume optimization, after he is optimized would like consideration for inpatient Mitraclip.  Discussed with  structural heart team.  - Place PICC to follow co-ox and CVP.  - Start milrinone 0.25 mcg/kg/min.  - Lasix 80 mg IV x 1 followed by Lasix gtt 12 mg/hr.  - Hold Coreg for now with marked volume overload and low output.  - Off spironolactone with hyperkalemia.  - Can continue Jardiance 10 mg daily.  - He will stay off Entresto with worsening renal function.  2. Atrial fibrillation: Paroxysmal. He had increased AF burden and was started on amiodarone.  He is in NSR today.   - ECG   - Continue Xarelto 15 mg daily.  - Continue amiodarone 200 mg daily. Check LFTs and TSH today.   3. Hyperlipidemia: Continue Repatha as outpatient.  4. OSA: Uses CPAP.    5. PAD: Minimal claudication.  Continue Repatha.  6. CKD: Stage 3b.  Follows with nephrology in [place].  - BMET today.  - Continue SGLT2i.  7. Mitral regurgitation: Echo in 10/24 showed new mitral regurgitation compared to 2022 echo.  The MR is not fully visualized, but I thought it was severe.  There was splay artifact and ERO by PISA is 0.44 cm^2. I worry that  this contributes to his worsening symptoms. TEE done today confirms severe MR, looks like 3 jets, functional MR with restriction of the posterior leaflet in the area of P1/P2.  ERO 0.55 cm^2 from largest jet.  - Diuresis and optimize filling pressures/cardiac output.  Will then have evaluation by structural heart to consider inpatient Mitraclip once he is optimized.    Marca Ancona, MD 08/05/2023, 11:52 AM  Advanced Heart Failure Team Pager [phone] (M-F; 7a - 5p)  Please contact [facility] for night-coverage after hours (4p -7a ) and weekends on [email or web address]

## 2023-08-06 ENCOUNTER — Other Ambulatory Visit (HOSPITAL_COMMUNITY): Payer: Self-pay

## 2023-08-06 ENCOUNTER — Inpatient Hospital Stay (HOSPITAL_COMMUNITY): Payer: Medicare PPO

## 2023-08-06 ENCOUNTER — Encounter (HOSPITAL_COMMUNITY): Payer: Self-pay | Admitting: Cardiology

## 2023-08-06 DIAGNOSIS — I5023 Acute on chronic systolic (congestive) heart failure: Secondary | ICD-10-CM

## 2023-08-06 LAB — BASIC METABOLIC PANEL
Anion gap: 10 (ref 5–15)
Anion gap: 11 (ref 5–15)
BUN: 33 mg/dL — ABNORMAL HIGH (ref 8–23)
BUN: 37 mg/dL — ABNORMAL HIGH (ref 8–23)
CO2: 25 mmol/L (ref 22–32)
CO2: 27 mmol/L (ref 22–32)
Calcium: 7.7 mg/dL — ABNORMAL LOW (ref 8.9–10.3)
Calcium: 7.9 mg/dL — ABNORMAL LOW (ref 8.9–10.3)
Chloride: 103 mmol/L (ref 98–111)
Chloride: 101 mmol/L (ref 98–111)
Creatinine, Ser: 2.43 mg/dL — ABNORMAL HIGH (ref 0.61–1.24)
Creatinine, Ser: 2.75 mg/dL — ABNORMAL HIGH (ref 0.61–1.24)
GFR, Estimated: 26 mL/min — ABNORMAL LOW (ref >60)
GFR, Estimated: 22 mL/min — ABNORMAL LOW (ref >60)
Glucose, Bld: 128 mg/dL — ABNORMAL HIGH (ref 70–99)
Glucose, Bld: 103 mg/dL — ABNORMAL HIGH (ref 70–99)
Potassium: 3.3 mmol/L — ABNORMAL LOW (ref 3.5–5.1)
Potassium: 3.9 mmol/L (ref 3.5–5.1)
Sodium: 138 mmol/L (ref 135–145)
Sodium: 139 mmol/L (ref 135–145)

## 2023-08-06 LAB — POCT I-STAT 7, (LYTES, BLD GAS, ICA,H+H)
Acid-Base Excess: 2 mmol/L (ref 0.0–2.0)
Acid-Base Excess: 2 mmol/L (ref 0.0–2.0)
Bicarbonate: 28.2 mmol/L — ABNORMAL HIGH (ref 20.0–28.0)
Bicarbonate: 28.4 mmol/L — ABNORMAL HIGH (ref 20.0–28.0)
Calcium, Ion: 1.12 mmol/L — ABNORMAL LOW (ref 1.15–1.40)
Calcium, Ion: 1.16 mmol/L (ref 1.15–1.40)
HCT: 33 % — ABNORMAL LOW (ref 39.0–52.0)
HCT: 33 % — ABNORMAL LOW (ref 39.0–52.0)
Hemoglobin: 11.2 g/dL — ABNORMAL LOW (ref 13.0–17.0)
Hemoglobin: 11.2 g/dL — ABNORMAL LOW (ref 13.0–17.0)
O2 Saturation: 48 %
O2 Saturation: 50 %
Potassium: 3.3 mmol/L — ABNORMAL LOW (ref 3.5–5.1)
Potassium: 3.4 mmol/L — ABNORMAL LOW (ref 3.5–5.1)
Sodium: 143 mmol/L (ref 135–145)
Sodium: 143 mmol/L (ref 135–145)
TCO2: 30 mmol/L (ref 22–32)
TCO2: 30 mmol/L (ref 22–32)
pCO2 arterial: 52.1 mm[Hg] — ABNORMAL HIGH (ref 32–48)
pCO2 arterial: 53 mm[Hg] — ABNORMAL HIGH (ref 32–48)
pH, Arterial: 7.337 — ABNORMAL LOW (ref 7.35–7.45)
pH, Arterial: 7.341 — ABNORMAL LOW (ref 7.35–7.45)
pO2, Arterial: 28 mm[Hg] — CL (ref 83–108)
pO2, Arterial: 29 mm[Hg] — CL (ref 83–108)

## 2023-08-06 LAB — CBC
HCT: 32 % — ABNORMAL LOW (ref 39.0–52.0)
Hemoglobin: 10.1 g/dL — ABNORMAL LOW (ref 13.0–17.0)
MCH: 30.8 pg (ref 26.0–34.0)
MCHC: 31.6 g/dL (ref 30.0–36.0)
MCV: 97.6 fL (ref 80.0–100.0)
Platelets: 141 10*3/uL — ABNORMAL LOW (ref 150–400)
RBC: 3.28 MIL/uL — ABNORMAL LOW (ref 4.22–5.81)
RDW: 15 % (ref 11.5–15.5)
WBC: 7.6 10*3/uL (ref 4.0–10.5)
nRBC: 0 % (ref 0.0–0.2)

## 2023-08-06 LAB — COOXEMETRY PANEL
Carboxyhemoglobin: 0.8 % (ref 0.5–1.5)
Carboxyhemoglobin: 1.6 % — ABNORMAL HIGH (ref 0.5–1.5)
Methemoglobin: <0.7 % (ref 0.0–1.5)
Methemoglobin: <0.7 % (ref 0.0–1.5)
O2 Saturation: 54.2 %
O2 Saturation: 55 %
Total hemoglobin: 10.6 g/dL — ABNORMAL LOW (ref 12.0–16.0)
Total hemoglobin: 10.1 g/dL — ABNORMAL LOW (ref 12.0–16.0)

## 2023-08-06 LAB — MAGNESIUM: Magnesium: 2.1 mg/dL (ref 1.7–2.4)

## 2023-08-06 MED ORDER — FUROSEMIDE 10 MG/ML IJ SOLN
12.0000 mg/h | INTRAMUSCULAR | Status: DC
  Administered 2023-08-06 (×2): 12 mg/h via INTRAVENOUS
  Filled 2023-08-06: qty 20

## 2023-08-06 MED ORDER — POTASSIUM CHLORIDE CRYS ER 20 MEQ PO TBCR
40.0000 meq | EXTENDED_RELEASE_TABLET | ORAL | Status: AC
  Administered 2023-08-06 (×2): 40 meq via ORAL
  Filled 2023-08-06 (×2): qty 2

## 2023-08-06 NOTE — TOC Initial Note (Signed)
Transition of Care [facility]) - Initial/Assessment Note    Patient Details  Name: Chad Brown MRN: [id number] Date of Birth: [birth year]  Transition of Care [facility] CM/SW Contact:    Nicanor Bake Phone Number: [phone] 08/06/2023, 2:49 PM  Clinical Narrative:   HF CSW met with pt, wife, and son at bedside. Pt stated that he lives at home with wife. Pt stated that he no history of HH services. Pt stated that he uses a cane, walker and BiPAP machine at home.  Pt stated that he has a scale at home. CSW and pt discussed SNF. Pt stated that he is not interested in a SNF and as he gets closer to dc he would prefer [facility] services. CSW explained that a follow up hospital appointment will be scheduled closer to dc.   TOC will continue following.                Expected Discharge Plan: Home/Self Care Barriers to Discharge: Continued Medical Work up   Patient Goals and CMS Choice            Expected Discharge Plan and Services       Living arrangements for the past 2 months: Single Family Home                                      Prior Living Arrangements/Services Living arrangements for the past 2 months: Single Family Home Lives with:: Pets, Spouse Patient language and need for interpreter reviewed:: Yes Do you feel safe going back to the place where you live?: Yes      Need for Family Participation in Patient Care: No (Comment) Care giver support system in place?: Yes (comment)   Criminal Activity/Legal Involvement Pertinent to Current Situation/Hospitalization: No - Comment as needed  Activities of Daily Living   ADL Screening (condition at time of admission) Independently performs ADLs?: Yes (appropriate for developmental age) Does the patient have a NEW difficulty with bathing/dressing/toileting/self-feeding that is expected to last >3 days?: No Does the patient have a NEW difficulty with getting in/out of bed, walking, or climbing stairs that is expected  to last >3 days?: No Does the patient have a NEW difficulty with communication that is expected to last >3 days?: No Is the patient deaf or have difficulty hearing?: No Does the patient have difficulty seeing, even when wearing glasses/contacts?: No Does the patient have difficulty concentrating, remembering, or making decisions?: No  Permission Sought/Granted                  Emotional Assessment Appearance:: Appears older than stated age Attitude/Demeanor/Rapport: Engaged Affect (typically observed): Appropriate Orientation: : Oriented to Self, Oriented to Place, Oriented to  Time, Oriented to Situation Alcohol / Substance Use: Not Applicable Psych Involvement: No (comment)  Admission diagnosis:  Acute on chronic systolic CHF (congestive heart failure) (HCC) [I50.23] Patient Active Problem List   Diagnosis Date Noted   Postsurgical hypothyroidism 11/09/2022   Hypocalcemia 10/24/2021   Hypothyroidism 08/02/2021   Papillary thyroid carcinoma (HCC) 08/02/2021   Neoplasm of uncertain behavior of thyroid gland 05/05/2021   Pulmonary nodules 02/15/2021   Right thyroid nodule 02/15/2021   Atherosclerotic heart disease of native coronary artery without angina pectoris 02/23/2020   Heart failure, unspecified (HCC) 02/23/2020   Biventricular ICD (implantable cardioverter-defibrillator) in place 02/01/2020   Hypercoagulable state due to persistent atrial fibrillation (HCC) 12/01/2019  Statin myopathy 11/16/2019   Peripheral arterial disease (HCC) 05/20/2019   Melanoma of skin (HCC) 07/28/2018   Benign prostatic hyperplasia 11/02/2016   Iron deficiency anemia 05/25/2016   OSA (obstructive sleep apnea) 01/04/2016   Heme positive stool    Atrial fibrillation with RVR (HCC) 03/21/2015   Chronic systolic CHF (congestive heart failure) (HCC) 03/08/2015   Hx of adenomatous colonic polyps 02/10/2015   Pancreatic cyst 02/10/2015   Loose stools 02/10/2015   Encounter for long-term  (current) use of antiplatelets/antithrombotics 01/10/2015   Hypotension 10/04/2014   Nonischemic cardiomyopathy (HCC) 10/04/2014   Hyperglycemia 10/04/2014   Troponin level elevated 10/04/2014   Sleep-disordered breathing 10/04/2014   Obesity 10/04/2014   Anemia, mild 10/04/2014   Thrombocytopenia (HCC) 10/04/2014   Atrial fibrillation with rapid ventricular response (HCC) 10/02/2014   Sinus bradycardia 10/02/2014   Acute on chronic systolic CHF (congestive heart failure) (HCC) 10/02/2014   Skin cancer 07/21/2014   cardiomyopathy 01/24/2013   Atrial fibrillation (HCC) 01/22/2013   Arthritis    Hyperlipidemia    Hypertension    Unspecified glaucoma 11/18/2012   Infected sebaceous cyst 05/15/2012   PCP:  Cleatis Polka., MD Pharmacy:   [facility] [place], [place] - [address] [place] [address] Phone: [phone] Fax: [phone]     Social Determinants of Health (SDOH) Social History: SDOH Screenings   Food Insecurity: No Food Insecurity (08/05/2023)  Housing: Low Risk  (08/05/2023)  Transportation Needs: No Transportation Needs (08/05/2023)  Utilities: Not At Risk (08/05/2023)  Depression (PHQ2-9): Low Risk  (11/03/2020)  Physical Activity: Inactive (11/03/2020)  Tobacco Use: Medium Risk (08/05/2023)   SDOH Interventions:     Readmission Risk Interventions     No data to display

## 2023-08-06 NOTE — Progress Notes (Signed)
Mobility Specialist Progress Note:    08/06/23 1159  Mobility  Activity Ambulated with assistance in hallway  Level of Assistance Contact guard assist, steadying assist  Assistive Device None  Distance Ambulated (ft) 250 ft  Activity Response Tolerated well  Mobility Referral Yes  $Mobility charge 1 Mobility  Mobility Specialist Start Time (ACUTE ONLY) 0955  Mobility Specialist Stop Time (ACUTE ONLY) 1008  Mobility Specialist Time Calculation (min) (ACUTE ONLY) 13 min   Received pt in bed having no complaints and agreeable to mobility. Pt was asymptomatic throughout ambulation and returned to room w/o fault. Left sitting EOB w/ call bell in reach and all needs met.   Thompson Grayer Mobility Specialist  Please contact vis Secure Chat or  Rehab Office [phone]

## 2023-08-06 NOTE — Progress Notes (Addendum)
Advanced Heart Failure Rounding Note  PCP-Cardiologist: Marca Ancona, MD   Subjective:   Admitted post TEE/RHC 11/4. TEE with severe MR (plan for structural eval for possible mitraclip). RHC with markedly elevated PCWP and elevated RA pressure with mixed pulmonary venous/pulmonary arterial hypertension and low CO. Started on milrinone gtt  Co-ox 54%  Scr 2.17>2.43  Diuresing with lasix gtt. -2.2L UOP. CVP 5/6  Feels good. Denies CP/SOB. Has been ambulating in the room  Objective:   Weight Range: 94.6 kg Body mass index is 33.66 kg/m.   Vital Signs:   Temp:  [97.5 F (36.4 C)-99.9 F (37.7 C)] 99.9 F (37.7 C) (11/05 0733) Pulse Rate:  [60-71] 66 (11/05 0733) Resp:  [11-21] 18 (11/05 0733) BP: (93-136)/(54-83) 107/62 (11/05 0733) SpO2:  [89 %-99 %] 94 % (11/05 0733) Weight:  [94.6 kg-95.4 kg] 94.6 kg (11/05 0357) Last BM Date : 08/04/23  Weight change: Filed Weights   08/05/23 1448 08/06/23 0104 08/06/23 0357  Weight: 95.4 kg 94.8 kg 94.6 kg    Intake/Output:   Intake/Output Summary (Last 24 hours) at 08/06/2023 0908 Last data filed at 08/06/2023 5284 Gross per 24 hour  Intake 1190 ml  Output 2250 ml  Net -1060 ml    Physical Exam    General:  elderly appearing.  No respiratory difficulty HEENT: normal Neck: supple. JVD flat. Carotids 2+ bilat; no bruits. No lymphadenopathy or thyromegaly appreciated. Cor: PMI nondisplaced. Regular rate & rhythm. No rubs, gallops . 3/6 MR murmur. Lungs: clear Abdomen: soft, nontender, nondistended. No hepatosplenomegaly. No bruits or masses. Good bowel sounds. Extremities: no cyanosis, clubbing, rash, edema  Neuro: alert & oriented x 3, cranial nerves grossly intact. moves all 4 extremities w/o difficulty. Affect pleasant.   Telemetry   Paced 60s (Personally reviewed)    EKG    No new EKG to review  Labs    CBC Recent Labs    08/05/23 1655 08/06/23 0457  WBC 6.2 7.6  NEUTROABS 4.3  --   HGB 11.5* 10.1*   HCT 36.7* 32.0*  MCV 98.4 97.6  PLT 148* 141*   Basic Metabolic Panel Recent Labs    13/24/40 1655 08/06/23 0457  NA 142 138  K 3.3* 3.3*  CL 106 103  CO2 22 25  GLUCOSE 120* 128*  BUN 28* 33*  CREATININE 2.17* 2.43*  CALCIUM 8.5* 7.7*  MG 2.3  --    Liver Function Tests Recent Labs    08/05/23 1655  AST 18  ALT 19  ALKPHOS 55  BILITOT 0.6  PROT 7.3  ALBUMIN 3.3*   No results for input(s): "LIPASE", "AMYLASE" in the last 72 hours. Cardiac Enzymes No results for input(s): "CKTOTAL", "CKMB", "CKMBINDEX", "TROPONINI" in the last 72 hours.  BNP: BNP (last 3 results) Recent Labs    10/18/22 0949 07/17/23 1227 08/05/23 1655  BNP 266.3* 410.5* 322.3*    ProBNP (last 3 results) No results for input(s): "PROBNP" in the last 8760 hours.   D-Dimer No results for input(s): "DDIMER" in the last 72 hours. Hemoglobin A1C No results for input(s): "HGBA1C" in the last 72 hours. Fasting Lipid Panel No results for input(s): "CHOL", "HDL", "LDLCALC", "TRIG", "CHOLHDL", "LDLDIRECT" in the last 72 hours. Thyroid Function Tests Recent Labs    08/05/23 1655  TSH 0.700    Other results:   Imaging    ECHO TEE  Result Date: 08/05/2023    TRANSESOPHOGEAL ECHO REPORT   Patient Name:   FREDIE MAJANO Date  of Exam: 08/05/2023 Medical Rec #:  [id number]        Height:       66.0 in Accession #:    [id number]       Weight:       208.0 lb Date of Birth:  May 10, 1941         BSA:          2.033 m Patient Age:    82 years         BP:           117/73 mmHg Patient Gender: M                HR:           59 bpm. Exam Location:  Inpatient Procedure: Transesophageal Echo, Cardiac Doppler, Color Doppler and 3D Echo Indications:    I34.0 Nonrheumatic mitral (valve) insufficiency  History:        Patient has prior history of Echocardiogram examinations, most                 recent 07/17/2023. Cardiomyopathy and CHF, CAD, Abnormal ECG and                 Pacemaker, Arrythmias:Atrial  Fibrillation, Signs/Symptoms:Chest                 Pain; Risk Factors:Hypertension, Dyslipidemia and Sleep Apnea.  Sonographer:    Sheralyn Boatman RDCS Referring Phys: [id number] [facility] PROCEDURE: After discussion of the risks and benefits of a TEE, an informed consent was obtained from the patient. The transesophogeal probe was passed without difficulty through the esophogus of the patient. Imaged were obtained with the patient in a left lateral decubitus position. Sedation performed by different physician. The patient was monitored while under deep sedation. Anesthestetic sedation was provided intravenously by Anesthesiology: 206mg  of Propofol. The patient developed no complications during the procedure.  IMPRESSIONS  1. Left ventricular ejection fraction, by estimation, is 45 to 50%. The left ventricle has mildly decreased function. The left ventricle demonstrates global hypokinesis.  2. Peak RV-RA gradient 51 mmHg. Right ventricular systolic function is mildly reduced. The right ventricular size is normal. There is severely elevated pulmonary artery systolic pressure.  3. Left atrial size was mildly dilated. No left atrial/left atrial appendage thrombus was detected.  4. The mitral valve is abnormal. There is restriction of the posterior leaflet predominantly in the P1/P2 region with several (looks like 3 predominant) jets of mitral regurgitation. PISA interrogation of the largest jet gives ERO 0.58 cm^2. 3-D vena contracta area 0.73 cm^2 incorporating all jets. There appeared to be slight systolic flow reversal in the left upper pulmonary vein doppler signal but not in the right pulmonary veins. Severe mitral valve regurgitation. No evidence of mitral stenosis. The mean mitral valve gradient is 1.0 mmHg.  5. The tricuspid valve is abnormal. Tricuspid valve regurgitation is moderate to severe.  6. The aortic valve is tricuspid. Aortic valve regurgitation is trivial. No aortic stenosis is present. FINDINGS  Left  Ventricle: Left ventricular ejection fraction, by estimation, is 45 to 50%. The left ventricle has mildly decreased function. The left ventricle demonstrates global hypokinesis. The left ventricular internal cavity size was normal in size. Right Ventricle: Peak RV-RA gradient 51 mmHg. The right ventricular size is normal. No increase in right ventricular wall thickness. Right ventricular systolic function is mildly reduced. There is severely elevated pulmonary artery systolic pressure. Left Atrium: Left atrial size was mildly dilated. No left  atrial/left atrial appendage thrombus was detected. Right Atrium: Right atrial size was normal in size. Pericardium: There is no evidence of pericardial effusion. Mitral Valve: There is restriction of the posterior leaflet predominantly in the P1/P2 region with several (looks like 3 predominant) jets of mitral regurgitation. PISA interrogation of the largest jet gives ERO 0.58 cm^2. 3-D vena contracta area 0.73 cm^2 incorporating all jets. There appeared to be slight systolic flow reversal in the left upper pulmonary vein doppler signal but not in the right pulmonary veins. The mitral valve is abnormal. Severe mitral valve regurgitation. No evidence of mitral valve stenosis. MV peak gradient, 2.5 mmHg. The mean mitral valve gradient is 1.0 mmHg. Tricuspid Valve: The tricuspid valve is abnormal. Tricuspid valve regurgitation is moderate to severe. Aortic Valve: The aortic valve is tricuspid. Aortic valve regurgitation is trivial. No aortic stenosis is present. Pulmonic Valve: The pulmonic valve was normal in structure. Pulmonic valve regurgitation is trivial. No evidence of pulmonic stenosis. Aorta: The aortic root is normal in size and structure. IAS/Shunts: No atrial level shunt detected by color flow Doppler. Additional Comments: A device lead is visualized in the right ventricle. Spectral Doppler performed. MITRAL VALVE                  TRICUSPID VALVE MV Peak grad: 2.5 mmHg         TR Peak grad:   51.6 mmHg MV Mean grad: 1.0 mmHg        TR Vmax:        359.00 cm/s MV Vmax:      0.79 m/s MV Vmean:     47.3 cm/s MR Peak grad:    78.9 mmHg MR Mean grad:    52.0 mmHg MR Vmax:         444.00 cm/s MR Vmean:        342.0 cm/s MR PISA:         7.60 cm MR PISA Eff ROA: 58 mm MR PISA Radius:  1.10 cm Juna Caban McleanMD Electronically signed by Wilfred Lacy Signature Date/Time: 08/05/2023/4:57:03 PM    Final    EP STUDY  Result Date: 08/05/2023 See surgical note for result.  [place] EKG SITE RITE  Result Date: 08/05/2023 If Site Rite image not attached, placement could not be confirmed due to current cardiac rhythm.  CARDIAC CATHETERIZATION  Result Date: 08/05/2023 1. Elevated right and left heart filling pressures. 2. Prominent V-waves in the PCWP tracing suggestive of significant mitral regurgitation. 3. Low cardiac output, CI 1.9. 4. 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 I am going to admit for diuresis given markedly elevated PCWP.    Medications:   Scheduled Medications:  amiodarone  200 mg Oral Daily   Chlorhexidine Gluconate Cloth  6 each Topical Daily   dorzolamide-timolol  1 drop Both Eyes BID   empagliflozin  10 mg Oral Daily   escitalopram  20 mg Oral Daily   finasteride  5 mg Oral Daily   gabapentin  200 mg Oral BID   levothyroxine  137 mcg Oral Q0600   potassium chloride  40 mEq Oral Q4H   rivaroxaban  15 mg Oral Daily   sodium chloride flush  10-40 mL Intracatheter Q12H   sodium chloride flush  3 mL Intravenous Q12H   sodium chloride flush  3 mL Intravenous Q12H   tamsulosin  0.4 mg Oral QPC supper    Infusions:  sodium chloride     sodium chloride     furosemide (LASIX) 200  mg in dextrose 5 % 100 mL (2 mg/mL) infusion 12 mg/hr (08/06/23 0521)   milrinone 0.25 mcg/kg/min (08/06/23 0518)    PRN Medications: sodium chloride, sodium chloride, acetaminophen, ondansetron (ZOFRAN) IV, sodium chloride flush, sodium chloride flush,  sodium chloride flush Patient Profile  DARTANYAN DEASIS is an 82 y.o. male with paroxysmal atrial fibrillation and chronic systolic CHF thought to be due to NiCM. Patient direct admitted post TEE and RHC. TEE with severe MR. RHC with low CO and significantly elevated right and left heart filling pressures. Admitted for lasix +milrinone gtt and structural heart eval for Mitraclip.  Assessment/Plan  1. Acute on chronic HF with mid-range EF: Nonischemic cardiomyopathy.  He has [employer] CRT-D device.  EF 15-20% on 6/16 TEE.  Cardiac MRI in 5/16 showed a LGE pattern that was not suggestive of coronary disease (?myocarditis or infiltrative disease).  There may be a component of tachycardia-mediated cardiomyopathy.  However, the cardiac MRI was done when he was in NSR.  In 9/16, echo showed EF increased to 40%.  Echo in 1/20 showed EF 45-50%.  Echo in 10/24 showed EF 40-45%, mild LV dilation, mildly decreased RV systolic function, moderate-severe MR with splay artifact and PISA ERO 0.44 cm^2, PASP 71 mmHg, dilated IVC.  The mitral regurgitation looked severe and appeared new compared to prior echo.  NYHA class III symptoms (worsened) and volume overloaded by exam and Optivol at last appointment.  Mitral regurgitation may be leading to worsening CHF.  GDMT has been limited in past by orthostatic symptoms and CKD.  He was brought in 11/4 for TEE to assess MR and RHC.  TEE confirmed severe functional MR with LV EF 45-50% and RHC showed markedly elevated PCWP and elevated RA pressure with mixed pulmonary venous/pulmonary arterial hypertension and low CO (CI 1.9).  Admitted him for diuresis/volume optimization, after he is optimized would like consideration for inpatient Mitraclip.  Discussed with structural heart team.  - CVP 5/6. Continue Lasix gtt @12  with high pressured on cath. Suspect PO transition tomorrow - Continue milrinone 0.25 mcg/kg/min. Co-ox 54% (repeat) - Hold Coreg for now with marked volume overload  and low output.  - Off spironolactone with hyperkalemia.  - Can continue Jardiance 10 mg daily.  - He will stay off Entresto with worsening renal function.  2. Atrial fibrillation: Paroxysmal. He had increased AF burden and was started on amiodarone.  He is in NSR today.   - ECG  - Continue Xarelto 15 mg daily.  - Continue amiodarone 200 mg daily. LFTs and TSH WNL.   3. Hyperlipidemia: Continue Repatha as outpatient.  4. OSA: Uses CPAP.    5. PAD: Minimal claudication.  Continue Repatha.  6. CKD: Stage 3b.  Follows with nephrology in [place].  - Scr 2.17>2.43 but this is close to his baseline. Follow with diuresis today - Continue SGLT2i.  7. Mitral regurgitation: Echo in 10/24 showed new mitral regurgitation compared to 2022 echo.  The MR is not fully visualized, but I thought it was severe.  There was splay artifact and ERO by PISA is 0.44 cm^2. I worry that this contributes to his worsening symptoms. TEE done today confirms severe MR, looks like 3 jets, functional MR with restriction of the posterior leaflet in the area of P1/P2.  ERO 0.55 cm^2 from largest jet.  - Diuresis and optimize filling pressures/cardiac output.  Will then have evaluation by structural heart to consider inpatient Mitraclip once he is optimized.    Length of Stay: 1  Alen Bleacher, NP  08/06/2023, 9:08 AM  Advanced Heart Failure Team Pager [phone] (M-F; 7a - 5p)  Please contact [facility] for night-coverage after hours (5p -7a ) and weekends on [email or web address]   Patient seen with NP, agree with the above note.   Creatinine higher at 2.4 but this is around his baseline.  CVP 8-9 today on my read, co-ox 54% on milrinone 0.25.  He is on Lasix gtt 12 mg/hr, I/Os net negative 1660 overnight.   No complaints.   General: NAD Neck: JVP 8-9 cm, no thyromegaly or thyroid nodule.  Lungs: Clear to auscultation bilaterally with normal respiratory effort. CV: Nondisplaced PMI.  Heart regular S1/S2, no S3/S4, 2/6 HSM  apex.  No peripheral edema.  Abdomen: Soft, nontender, no hepatosplenomegaly, no distention.  Skin: Intact without lesions or rashes.  Neurologic: Alert and oriented x 3.  Psych: Normal affect. Extremities: No clubbing or cyanosis.  HEENT: Normal.   Co-ox marginal early am, will repeat.  Continue milrinone 0.25 for now.  CVP down to 8-9 with diuresis but PCWP very high on RHC yesterday.  Would give 1 more day of Lasix gtt 12 mg/hr.    Structural heart evaluation today for Mitraclip.   Mobilize.   Marca Ancona 08/06/2023 9:40 AM

## 2023-08-06 NOTE — Plan of Care (Signed)
  Problem: Education: Goal: Knowledge of General Education information will improve Description: Including pain rating scale, medication(s)/side effects and non-pharmacologic comfort measures Outcome: Progressing   Problem: Activity: Goal: Risk for activity intolerance will decrease Outcome: Progressing   Problem: Skin Integrity: Goal: Risk for impaired skin integrity will decrease Outcome: Progressing   

## 2023-08-06 NOTE — Progress Notes (Signed)
Co-ox collected and tubed to lab. Lab made aware. Per Dr. Shirlee Latch at bedside, continue lasix drip.

## 2023-08-07 ENCOUNTER — Inpatient Hospital Stay (HOSPITAL_COMMUNITY): Payer: Medicare PPO

## 2023-08-07 DIAGNOSIS — I340 Nonrheumatic mitral (valve) insufficiency: Secondary | ICD-10-CM | POA: Diagnosis not present

## 2023-08-07 DIAGNOSIS — I5023 Acute on chronic systolic (congestive) heart failure: Secondary | ICD-10-CM | POA: Diagnosis not present

## 2023-08-07 LAB — BASIC METABOLIC PANEL
Anion gap: 9 (ref 5–15)
BUN: 39 mg/dL — ABNORMAL HIGH (ref 8–23)
CO2: 26 mmol/L (ref 22–32)
Calcium: 7.7 mg/dL — ABNORMAL LOW (ref 8.9–10.3)
Chloride: 103 mmol/L (ref 98–111)
Creatinine, Ser: 2.61 mg/dL — ABNORMAL HIGH (ref 0.61–1.24)
GFR, Estimated: 24 mL/min — ABNORMAL LOW (ref >60)
Glucose, Bld: 119 mg/dL — ABNORMAL HIGH (ref 70–99)
Potassium: 3.5 mmol/L (ref 3.5–5.1)
Sodium: 138 mmol/L (ref 135–145)

## 2023-08-07 LAB — CBC
HCT: 32.1 % — ABNORMAL LOW (ref 39.0–52.0)
Hemoglobin: 10.2 g/dL — ABNORMAL LOW (ref 13.0–17.0)
MCH: 31.1 pg (ref 26.0–34.0)
MCHC: 31.8 g/dL (ref 30.0–36.0)
MCV: 97.9 fL (ref 80.0–100.0)
Platelets: 140 10*3/uL — ABNORMAL LOW (ref 150–400)
RBC: 3.28 MIL/uL — ABNORMAL LOW (ref 4.22–5.81)
RDW: 14.9 % (ref 11.5–15.5)
WBC: 7.2 10*3/uL (ref 4.0–10.5)
nRBC: 0 % (ref 0.0–0.2)

## 2023-08-07 LAB — ECHOCARDIOGRAM COMPLETE
AR max vel: 1.8 cm2
AV Area VTI: 1.58 cm2
AV Area mean vel: 1.76 cm2
AV Mean grad: 3 mm[Hg]
AV Peak grad: 7 mm[Hg]
Ao pk vel: 1.32 m/s
Area-P 1/2: 4.01 cm2
Calc EF: 44.7 %
Height: 66 in
MV M vel: 4.58 m/s
MV Peak grad: 83.9 mm[Hg]
P 1/2 time: 676 ms
S' Lateral: 4.4 cm
Single Plane A2C EF: 42.9 %
Single Plane A4C EF: 45.5 %
Weight: 3336.88 [oz_av]

## 2023-08-07 LAB — COOXEMETRY PANEL
Carboxyhemoglobin: 2 % — ABNORMAL HIGH (ref 0.5–1.5)
Carboxyhemoglobin: 1.3 % (ref 0.5–1.5)
Methemoglobin: <0.7 % (ref 0.0–1.5)
Methemoglobin: <0.7 % (ref 0.0–1.5)
O2 Saturation: 70.2 %
O2 Saturation: 61.3 %
Total hemoglobin: 10.6 g/dL — ABNORMAL LOW (ref 12.0–16.0)
Total hemoglobin: 11.2 g/dL — ABNORMAL LOW (ref 12.0–16.0)

## 2023-08-07 LAB — LIPOPROTEIN A (LPA): Lipoprotein (a): <8.4 nmol/L (ref <75.0)

## 2023-08-07 MED ORDER — POTASSIUM CHLORIDE CRYS ER 20 MEQ PO TBCR
20.0000 meq | EXTENDED_RELEASE_TABLET | Freq: Once | ORAL | Status: AC
  Administered 2023-08-07: 20 meq via ORAL
  Filled 2023-08-07: qty 1

## 2023-08-07 NOTE — Progress Notes (Signed)
Mobility Specialist Progress Note:   08/07/23 1200  Mobility  Activity Ambulated with assistance in hallway  Level of Assistance Standby assist, set-up cues, supervision of patient - no hands on  Assistive Device None  Distance Ambulated (ft) 320 ft  Activity Response Tolerated well  Mobility Referral Yes  $Mobility charge 1 Mobility  Mobility Specialist Start Time (ACUTE ONLY) 1200  Mobility Specialist Stop Time (ACUTE ONLY) 1220  Mobility Specialist Time Calculation (min) (ACUTE ONLY) 20 min   Pt agreeable to mobility session. Required no physical assistance throughout. Back sitting EOB with all needs met, family present.  [name] Mobility Specialist Please contact via SecureChat or  Rehab office at [phone]

## 2023-08-07 NOTE — TOC Progression Note (Signed)
Transition of Care [facility]) - Progression Note    Patient Details  Name: Chad Brown MRN: [id number] Date of Birth: 07/11/1941  [facility] CM/SW Contact  Nicanor Bake Phone Number: [phone] 08/07/2023, 9:55 AM  Clinical Narrative:  [facility] CSW called pts PCP to schedule his hospital follow up appointment and left a VM asking to be called back.   TOC will continue following.      Expected Discharge Plan: Home/Self Care Barriers to Discharge: Continued Medical Work up  Expected Discharge Plan and Services       Living arrangements for the past 2 months: Single Family Home                                       Social Determinants of Health (SDOH) Interventions SDOH Screenings   Food Insecurity: No Food Insecurity (08/05/2023)  Housing: Low Risk  (08/05/2023)  Transportation Needs: No Transportation Needs (08/05/2023)  Utilities: Not At Risk (08/05/2023)  Depression (PHQ2-9): Low Risk  (11/03/2020)  Physical Activity: Inactive (11/03/2020)  Tobacco Use: Medium Risk (08/05/2023)    Readmission Risk Interventions     No data to display

## 2023-08-07 NOTE — Plan of Care (Signed)
  Problem: Education: Goal: Understanding of CV disease, CV risk reduction, and recovery process will improve Outcome: Progressing   Problem: Education: Goal: Knowledge of General Education information will improve Description Including pain rating scale, medication(s)/side effects and non-pharmacologic comfort measures Outcome: Progressing   

## 2023-08-07 NOTE — Progress Notes (Signed)
Echocardiogram 2D Echocardiogram has been performed.  Chad Brown 08/07/2023, 2:37 PM

## 2023-08-07 NOTE — Progress Notes (Addendum)
Advanced Heart Failure Rounding Note  PCP-Cardiologist: Marca Ancona, MD   Subjective:    Diuresing 2.25 UOP. CVP 1-2. Co-ox 70.2%. sCr now trending up 2.43>2.75>2.61  No SOB or CP.   Objective:   Weight Range: 94.6 kg Body mass index is 33.66 kg/m.   Vital Signs:   Temp:  [97.8 F (36.6 C)-99.1 F (37.3 C)] 98.8 F (37.1 C) (11/06 0719) Pulse Rate:  [60-77] 77 (11/06 0719) Resp:  [17-18] 18 (11/06 0719) BP: (105-147)/(54-69) 106/66 (11/06 0719) SpO2:  [90 %-98 %] 94 % (11/06 0719) Weight:  [94.6 kg] 94.6 kg (11/06 0541) Last BM Date : 08/06/23  Weight change: Filed Weights   08/06/23 0104 08/06/23 0357 08/07/23 0541  Weight: 94.8 kg 94.6 kg 94.6 kg    Intake/Output:   Intake/Output Summary (Last 24 hours) at 08/07/2023 0852 Last data filed at 08/07/2023 0721 Gross per 24 hour  Intake 735.34 ml  Output 2650 ml  Net -1914.66 ml    Physical Exam    CVP 1 General: Well appearing. No distress on RA HEENT: neck supple.  Cardiac: JVP not visible. S1, S2 and S4 present. 4/6 MR murmur. Resp: Lung sounds clear and equal B/L Abdomen: Soft, non-tender, rounded. + BS. Extremities: Warm and dry. No rash, cyanosis, or edema. RUE PICC Neuro: Alert and oriented x3. Affect pleasant. Moves all extremities without difficulty.  Telemetry   Paced 60s (personally reviewed)  EKG    N/A  Labs    CBC Recent Labs    08/05/23 1655 08/06/23 0457 08/07/23 0351  WBC 6.2 7.6 7.2  NEUTROABS 4.3  --   --   HGB 11.5* 10.1* 10.2*  HCT 36.7* 32.0* 32.1*  MCV 98.4 97.6 97.9  PLT 148* 141* 140*   Basic Metabolic Panel Recent Labs    40/98/11 1655 08/06/23 0457 08/06/23 0500 08/06/23 1456 08/07/23 0351  NA 142   < >  --  139 138  K 3.3*   < >  --  3.9 3.5  CL 106   < >  --  101 103  CO2 22   < >  --  27 26  GLUCOSE 120*   < >  --  103* 119*  BUN 28*   < >  --  37* 39*  CREATININE 2.17*   < >  --  2.75* 2.61*  CALCIUM 8.5*   < >  --  7.9* 7.7*  MG 2.3  --   2.1  --   --    < > = values in this interval not displayed.   Liver Function Tests Recent Labs    08/05/23 1655  AST 18  ALT 19  ALKPHOS 55  BILITOT 0.6  PROT 7.3  ALBUMIN 3.3*   No results for input(s): "LIPASE", "AMYLASE" in the last 72 hours. Cardiac Enzymes No results for input(s): "CKTOTAL", "CKMB", "CKMBINDEX", "TROPONINI" in the last 72 hours.  BNP: BNP (last 3 results) Recent Labs    10/18/22 0949 07/17/23 1227 08/05/23 1655  BNP 266.3* 410.5* 322.3*   ProBNP (last 3 results) No results for input(s): "PROBNP" in the last 8760 hours.  D-Dimer No results for input(s): "DDIMER" in the last 72 hours. Hemoglobin A1C No results for input(s): "HGBA1C" in the last 72 hours. Fasting Lipid Panel No results for input(s): "CHOL", "HDL", "LDLCALC", "TRIG", "CHOLHDL", "LDLDIRECT" in the last 72 hours. Thyroid Function Tests Recent Labs    08/05/23 1655  TSH 0.700   Imaging   No  results found.  Medications:   Scheduled Medications:  amiodarone  200 mg Oral Daily   Chlorhexidine Gluconate Cloth  6 each Topical Daily   dorzolamide-timolol  1 drop Both Eyes BID   empagliflozin  10 mg Oral Daily   escitalopram  20 mg Oral Daily   finasteride  5 mg Oral Daily   gabapentin  200 mg Oral BID   levothyroxine  137 mcg Oral Q0600   rivaroxaban  15 mg Oral Daily   sodium chloride flush  10-40 mL Intracatheter Q12H   sodium chloride flush  3 mL Intravenous Q12H   tamsulosin  0.4 mg Oral QPC supper    Infusions:  furosemide (LASIX) 200 mg in dextrose 5 % 100 mL (2 mg/mL) infusion 12 mg/hr (08/06/23 2116)   milrinone 0.25 mcg/kg/min (08/06/23 2032)    PRN Medications: acetaminophen, ondansetron (ZOFRAN) IV, sodium chloride flush, sodium chloride flush  Patient Profile   Chad Brown is an 82 y.o. male with paroxysmal atrial fibrillation and chronic systolic CHF thought to be due to NiCM. Patient direct admitted post TEE and RHC. TEE with severe MR. RHC with low  CO and significantly elevated right and left heart filling pressures. Admitted for lasix +milrinone gtt and structural heart eval for Mitraclip.   Assessment/Plan   1. Acute on chronic HF with mid-range EF: Nonischemic cardiomyopathy.  He has [employer] CRT-D device.  EF 15-20% on 6/16 TEE.  Cardiac MRI in 5/16 showed a LGE pattern that was not suggestive of coronary disease (?myocarditis or infiltrative disease).  There may be a component of tachycardia-mediated cardiomyopathy.  However, the cardiac MRI was done when he was in NSR.  In 9/16, echo showed EF increased to 40%.  Echo in 1/20 showed EF 45-50%.  Echo in 10/24 showed EF 40-45%, mild LV dilation, mildly decreased RV systolic function, moderate-severe MR with splay artifact and PISA ERO 0.44 cm^2, PASP 71 mmHg, dilated IVC.  The mitral regurgitation looked severe and appeared new compared to prior echo.  NYHA class III symptoms (worsened) and volume overloaded by exam and Optivol at last appointment.  Mitral regurgitation may be leading to worsening CHF.  GDMT has been limited in past by orthostatic symptoms and CKD.  He was brought in 11/4 for TEE to assess MR and RHC.  TEE confirmed severe functional MR with LV EF 45-50% and RHC showed markedly elevated PCWP and elevated RA pressure with mixed pulmonary venous/pulmonary arterial hypertension and low CO (CI 1.9).  Admitted him for diuresis/volume optimization, after he is optimized would like consideration for inpatient Mitraclip.  Discussed with structural heart team.  - CVP 1. Stop Lasix gtt. Transition to PO this afternoon or tomorrow.  - Continue milrinone 0.25 mcg/kg/min. Co-ox 70.2. Repeat this afternoon. - Hold Coreg for now with marked volume overload and low output.  - Off spironolactone with renal function - Continue Jardiance 10 mg daily.  - He will stay off Entresto with worsening renal function.  2. Atrial fibrillation: Paroxysmal. He had increased AF burden and was started on  amiodarone.  He is in V-paced today - ECG  - Continue Xarelto 15 mg daily.  - Continue amiodarone 200 mg daily. LFTs and TSH WNL.   3. Hyperlipidemia: Continue Repatha as outpatient.  4. OSA: Uses CPAP.    5. PAD: Minimal claudication.  Continue Repatha.  6. CKD: Stage 3b.  Follows with nephrology in [place].  - Scr 2.17>2.43>2.75>2.61.  - Continue SGLT2i.  7. Mitral regurgitation: Echo in 10/24  showed new mitral regurgitation compared to 2022 echo.  The MR was not fully visualized, but I thought it was severe.  There was splay artifact and ERO by PISA is 0.44 cm^2. I worry that this contributes to his worsening symptoms. TEE confirms severe MR, looks like 3 jets, functional MR with restriction of the posterior leaflet in the area of P1/P2.  ERO 0.55 cm^2 from largest jet.  - Diuresis and optimize filling pressures/cardiac output.  Structural Heart following.  - Per structural heart: repeat TTE once euvolemic to determine significance of MR for consideration of mitral transcatheter edge-to-edge repair this admission vs. as an outpatient.   Length of Stay: 2  [place] Lee, NP  08/07/2023, 8:52 AM  Advanced Heart Failure Team Pager [phone] (M-F; 7a - 5p)  Please contact [facility] for night-coverage after hours (5p -7a ) and weekends on [email or web address]  Patient seen with NP, agree with the above note.   He diuresed again yesterday, CVP now < 5.  Creatinine fairly stable at 2.61, co-ox 70% on milrinone 0.25 mcg/kg/min.  He feels much better and was able to walk down the hall and back without problems.  General: NAD Neck: No JVD, no thyromegaly or thyroid nodule.  Lungs: Clear to auscultation bilaterally with normal respiratory effort. CV: Nondisplaced PMI.  Heart regular S1/S2, no S3/S4, 2/6 HSM apex.  No peripheral edema.  Abdomen: Soft, nontender, no hepatosplenomegaly, no distention.  Skin: Intact without lesions or rashes.  Neurologic: Alert and oriented x 3.  Psych: Normal  affect. Extremities: No clubbing or cyanosis.  HEENT: Normal.   Volume status appears as optimized as we can get it at this point with CVP < 5.  Creatinine elevated but not far off his baseline.  - Will stop IV Lasix.  - Start torsemide 60 mg daily tomorrow morning.  - Decrease milrinone to 0.125, hopefully can stop tomorrow.  - GDMT limited by CKD.   I suspect that severe functional MR is playing a significant role in his symptoms.  Patient has been seen by structural heart service.   - I will repeat an echo today now that he has been fully diuresed.  If MR remains severe post-diuresis, will try to get him home for outpatient followup with structural heart service to set up Mitraclip.   Marca Ancona 08/07/2023 9:53 AM

## 2023-08-08 DIAGNOSIS — I5023 Acute on chronic systolic (congestive) heart failure: Secondary | ICD-10-CM | POA: Diagnosis not present

## 2023-08-08 LAB — COOXEMETRY PANEL
Carboxyhemoglobin: 1.9 % — ABNORMAL HIGH (ref 0.5–1.5)
Methemoglobin: <0.7 % (ref 0.0–1.5)
O2 Saturation: 55.9 %
Total hemoglobin: 10.7 g/dL — ABNORMAL LOW (ref 12.0–16.0)

## 2023-08-08 LAB — BASIC METABOLIC PANEL
Anion gap: 8 (ref 5–15)
BUN: 41 mg/dL — ABNORMAL HIGH (ref 8–23)
CO2: 25 mmol/L (ref 22–32)
Calcium: 7.7 mg/dL — ABNORMAL LOW (ref 8.9–10.3)
Chloride: 104 mmol/L (ref 98–111)
Creatinine, Ser: 2.46 mg/dL — ABNORMAL HIGH (ref 0.61–1.24)
GFR, Estimated: 26 mL/min — ABNORMAL LOW (ref >60)
Glucose, Bld: 110 mg/dL — ABNORMAL HIGH (ref 70–99)
Potassium: 3.5 mmol/L (ref 3.5–5.1)
Sodium: 137 mmol/L (ref 135–145)

## 2023-08-08 LAB — CBC
HCT: 31 % — ABNORMAL LOW (ref 39.0–52.0)
Hemoglobin: 9.8 g/dL — ABNORMAL LOW (ref 13.0–17.0)
MCH: 30.7 pg (ref 26.0–34.0)
MCHC: 31.6 g/dL (ref 30.0–36.0)
MCV: 97.2 fL (ref 80.0–100.0)
Platelets: 130 10*3/uL — ABNORMAL LOW (ref 150–400)
RBC: 3.19 MIL/uL — ABNORMAL LOW (ref 4.22–5.81)
RDW: 14.9 % (ref 11.5–15.5)
WBC: 6.5 10*3/uL (ref 4.0–10.5)
nRBC: 0 % (ref 0.0–0.2)

## 2023-08-08 MED ORDER — SODIUM CHLORIDE 0.9% FLUSH: 10.0000 mL | Freq: Two times a day (BID) | INTRAVENOUS | Status: DC

## 2023-08-08 MED ORDER — TORSEMIDE 20 MG PO TABS
60.0000 mg | ORAL_TABLET | Freq: Every day | ORAL | Status: DC
  Administered 2023-08-08 – 2023-08-09 (×2): 60 mg via ORAL
  Filled 2023-08-08 (×2): qty 3

## 2023-08-08 NOTE — Plan of Care (Signed)
  Problem: Education: Goal: Understanding of CV disease, CV risk reduction, and recovery process will improve Outcome: Progressing   Problem: Clinical Measurements: Goal: Respiratory complications will improve Outcome: Progressing   Problem: Activity: Goal: Risk for activity intolerance will decrease Outcome: Progressing

## 2023-08-08 NOTE — H&P (View-Only) (Signed)
 Advanced Heart Failure Rounding Note  PCP-Cardiologist: Marca Ancona, MD   Subjective:   IV diuretics held yesterday. Milrinone reduced to 0.125   Co-ox 56% today. 1.6L in UOP yesterday. Wt unchanged, stable at 208 lb. CVP 5-6  SCr 2.75>>2.61>>2.46  K 3.5.   Echo yesterday showed EF 45-50%. Mitral regurgitation not well visualized. Does appear improved from prior echo 07/2023 but suspect underestimating severity of MR given poor visualization. Suggest cardiac MRI to quantify MR severity.  He reports improvement in symptoms. Breathing better.  No current complaints.   Objective:   Weight Range: 94.7 kg Body mass index is 33.69 kg/m.   Vital Signs:   Temp:  [97.8 F (36.6 C)-99.3 F (37.4 C)] 99.3 F (37.4 C) (11/07 0405) Pulse Rate:  [60-67] 63 (11/07 0829) Resp:  [18] 18 (11/07 0829) BP: (100-127)/(50-75) 120/63 (11/07 0829) SpO2:  [90 %-96 %] 93 % (11/07 0829) Weight:  [94.7 kg] 94.7 kg (11/07 0405) Last BM Date : 08/07/23  Weight change: Filed Weights   08/06/23 0357 08/07/23 0541 08/08/23 0405  Weight: 94.6 kg 94.6 kg 94.7 kg    Intake/Output:   Intake/Output Summary (Last 24 hours) at 08/08/2023 1022 Last data filed at 08/08/2023 0932 Gross per 24 hour  Intake 811.34 ml  Output 1025 ml  Net -213.66 ml    Physical Exam   CVP 5-6  General:  Well appearing, elderly male laying in bed. No respiratory difficulty HEENT: normal Neck: supple. JVD 6 cm. Carotids 2+ bilat; no bruits. No lymphadenopathy or thyromegaly appreciated. Cor: PMI nondisplaced. Regular rate & rhythm. No rubs, gallops or murmurs. Lungs: clear bilaterally  Abdomen: soft, nontender, +distended. No hepatosplenomegaly. No bruits or masses. Good bowel sounds. Extremities: no cyanosis, clubbing, rash, edema + RUE PICC  Neuro: alert & oriented x 3, cranial nerves grossly intact. moves all 4 extremities w/o difficulty. Affect pleasant.   Telemetry   Paced 60s (personally reviewed)  EKG     N/A  Labs    CBC Recent Labs    08/05/23 1655 08/06/23 0457 08/07/23 0351 08/08/23 0600  WBC 6.2   < > 7.2 6.5  NEUTROABS 4.3  --   --   --   HGB 11.5*   < > 10.2* 9.8*  HCT 36.7*   < > 32.1* 31.0*  MCV 98.4   < > 97.9 97.2  PLT 148*   < > 140* 130*   < > = values in this interval not displayed.   Basic Metabolic Panel Recent Labs    29/52/84 1655 08/06/23 0457 08/06/23 0500 08/06/23 1456 08/07/23 0351 08/08/23 0600  NA 142   < >  --    < > 138 137  K 3.3*   < >  --    < > 3.5 3.5  CL 106   < >  --    < > 103 104  CO2 22   < >  --    < > 26 25  GLUCOSE 120*   < >  --    < > 119* 110*  BUN 28*   < >  --    < > 39* 41*  CREATININE 2.17*   < >  --    < > 2.61* 2.46*  CALCIUM 8.5*   < >  --    < > 7.7* 7.7*  MG 2.3  --  2.1  --   --   --    < > = values in  this interval not displayed.   Liver Function Tests Recent Labs    08/05/23 1655  AST 18  ALT 19  ALKPHOS 55  BILITOT 0.6  PROT 7.3  ALBUMIN 3.3*   No results for input(s): "LIPASE", "AMYLASE" in the last 72 hours. Cardiac Enzymes No results for input(s): "CKTOTAL", "CKMB", "CKMBINDEX", "TROPONINI" in the last 72 hours.  BNP: BNP (last 3 results) Recent Labs    10/18/22 0949 07/17/23 1227 08/05/23 1655  BNP 266.3* 410.5* 322.3*   ProBNP (last 3 results) No results for input(s): "PROBNP" in the last 8760 hours.  D-Dimer No results for input(s): "DDIMER" in the last 72 hours. Hemoglobin A1C No results for input(s): "HGBA1C" in the last 72 hours. Fasting Lipid Panel No results for input(s): "CHOL", "HDL", "LDLCALC", "TRIG", "CHOLHDL", "LDLDIRECT" in the last 72 hours. Thyroid Function Tests Recent Labs    08/05/23 1655  TSH 0.700   Imaging   ECHOCARDIOGRAM COMPLETE  Result Date: 08/07/2023    ECHOCARDIOGRAM REPORT   Patient Name:   Chad Brown Date of Exam: 08/07/2023 Medical Rec #:  [id number]        Height:       66.0 in Accession #:    [id number]       Weight:       208.6 lb Date  of Birth:  18-Apr-1941         BSA:          2.036 m Patient Age:    82 years         BP:           106/66 mmHg Patient Gender: M                HR:           88 bpm. Exam Location:  Inpatient Procedure: 2D Echo, Cardiac Doppler and Color Doppler Indications:    Mitral Valve Disorder  History:        Patient has prior history of Echocardiogram examinations, most                 recent 07/17/2023. Pacemaker and Defibrillator; Risk                 Factors:Hypertension, Diabetes and Sleep Apnea.  Sonographer:    Karma Ganja Referring Phys: [phone] Saahil Herbster S Nico Syme IMPRESSIONS  1. Technically difficult study. Left ventricular ejection fraction, by estimation, is 45 to 50%. The left ventricle has mildly decreased function. The left ventricle demonstrates global hypokinesis. The left ventricular internal cavity size was mildly dilated. There is mild left ventricular hypertrophy. Left ventricular diastolic parameters are indeterminate.  2. Right ventricular systolic function is normal. The right ventricular size is mildly enlarged. There is severely elevated pulmonary artery systolic pressure. The estimated right ventricular systolic pressure is 71.2 mmHg.  3. Left atrial size was mildly dilated.  4. Right atrial size was severely dilated.  5. The aortic valve is tricuspid. Aortic valve regurgitation is trivial. No aortic stenosis is present.  6. The mitral valve is abnormal. Mild mitral valve regurgitation. No evidence of mitral stenosis. Mitral regurgitation not well visualized on current study. Does appear improved from prior echo 07/2023 but suspect underestimating severity of MR given poor visualization. Suggest cardiac MRI to quantify MR severity FINDINGS  Left Ventricle: Left ventricular ejection fraction, by estimation, is 45 to 50%. The left ventricle has mildly decreased function. The left ventricle demonstrates global hypokinesis. The left ventricular internal cavity size was mildly  dilated. There is  mild left  ventricular hypertrophy. Left ventricular diastolic parameters are indeterminate. Right Ventricle: The right ventricular size is mildly enlarged. No increase in right ventricular wall thickness. Right ventricular systolic function is normal. There is severely elevated pulmonary artery systolic pressure. The tricuspid regurgitant velocity is 4.13 m/s, and with an assumed right atrial pressure of 3 mmHg, the estimated right ventricular systolic pressure is 71.2 mmHg. Left Atrium: Left atrial size was mildly dilated. Right Atrium: Right atrial size was severely dilated. Pericardium: There is no evidence of pericardial effusion. Presence of epicardial fat layer. Mitral Valve: The mitral valve is abnormal. Mild mitral valve regurgitation. No evidence of mitral valve stenosis. Tricuspid Valve: The tricuspid valve is normal in structure. Tricuspid valve regurgitation is trivial. Aortic Valve: The aortic valve is tricuspid. Aortic valve regurgitation is trivial. Aortic regurgitation PHT measures 676 msec. No aortic stenosis is present. Aortic valve mean gradient measures 3.0 mmHg. Aortic valve peak gradient measures 7.0 mmHg. Aortic valve area, by VTI measures 1.58 cm. Pulmonic Valve: The pulmonic valve was not well visualized. Pulmonic valve regurgitation is not visualized. Aorta: The aortic root is normal in size and structure. IAS/Shunts: The interatrial septum was not well visualized.  LEFT VENTRICLE PLAX 2D LVIDd:         6.00 cm      Diastology LVIDs:         4.40 cm      LV e' medial:    5.87 cm/s LV PW:         1.20 cm      LV E/e' medial:  17.4 LV IVS:        1.20 cm      LV e' lateral:   12.30 cm/s LVOT diam:     2.00 cm      LV E/e' lateral: 8.3 LV SV:         44 LV SV Index:   22 LVOT Area:     3.14 cm  LV Volumes (MOD) LV vol d, MOD A2C: 58.7 ml LV vol d, MOD A4C: 103.0 ml LV vol s, MOD A2C: 33.5 ml LV vol s, MOD A4C: 56.1 ml LV SV MOD A2C:     25.2 ml LV SV MOD A4C:     103.0 ml LV SV MOD BP:      35.4 ml  RIGHT VENTRICLE RV Basal diam:  4.20 cm RV S prime:     12.20 cm/s TAPSE (M-mode): 3.2 cm LEFT ATRIUM             Index        RIGHT ATRIUM           Index LA Vol (A2C):   75.6 ml 37.14 ml/m  RA Area:     30.30 cm LA Vol (A4C):   89.0 ml 43.72 ml/m  RA Volume:   115.00 ml 56.49 ml/m LA Biplane Vol: 83.3 ml 40.92 ml/m  AORTIC VALVE AV Area (Vmax):    1.80 cm AV Area (Vmean):   1.76 cm AV Area (VTI):     1.58 cm AV Vmax:           132.00 cm/s AV Vmean:          85.900 cm/s AV VTI:            0.278 m AV Peak Grad:      7.0 mmHg AV Mean Grad:      3.0 mmHg LVOT Vmax:  75.70 cm/s LVOT Vmean:        48.100 cm/s LVOT VTI:          0.140 m LVOT/AV VTI ratio: 0.50 AI PHT:            676 msec  AORTA Ao Root diam: 4.20 cm MITRAL VALVE                TRICUSPID VALVE MV Area (PHT): 4.01 cm     TR Peak grad:   68.2 mmHg MV Decel Time: 189 msec     TR Vmax:        413.00 cm/s MR Peak grad: 83.9 mmHg MR Mean grad: 48.0 mmHg     SHUNTS MR Vmax:      458.00 cm/s   Systemic VTI:  0.14 m MR Vmean:     314.0 cm/s    Systemic Diam: 2.00 cm MV E velocity: 102.00 cm/s MV A velocity: 46.70 cm/s MV E/A ratio:  2.18 Epifanio Lesches MD Electronically signed by Epifanio Lesches MD Signature Date/Time: 08/07/2023/6:03:40 PM    Final     Medications:   Scheduled Medications:  amiodarone  200 mg Oral Daily   Chlorhexidine Gluconate Cloth  6 each Topical Daily   dorzolamide-timolol  1 drop Both Eyes BID   empagliflozin  10 mg Oral Daily   escitalopram  20 mg Oral Daily   finasteride  5 mg Oral Daily   gabapentin  200 mg Oral BID   levothyroxine  137 mcg Oral Q0600   rivaroxaban  15 mg Oral Daily   sodium chloride flush  10-40 mL Intracatheter Q12H   sodium chloride flush  3 mL Intravenous Q12H   tamsulosin  0.4 mg Oral QPC supper    Infusions:  milrinone 0.125 mcg/kg/min (08/07/23 1130)    PRN Medications: acetaminophen, ondansetron (ZOFRAN) IV, sodium chloride flush, sodium chloride flush  Patient  Profile   KAMIR SELOVER is an 82 y.o. male with paroxysmal atrial fibrillation and chronic systolic CHF thought to be due to NiCM. Patient direct admitted post TEE and RHC. TEE with severe MR. RHC with low CO and significantly elevated right and left heart filling pressures. Admitted for lasix +milrinone gtt and structural heart eval for Mitraclip.   Assessment/Plan   1. Acute on chronic HF with mid-range EF: Nonischemic cardiomyopathy.  He has [employer] CRT-D device.  EF 15-20% on 6/16 TEE.  Cardiac MRI in 5/16 showed a LGE pattern that was not suggestive of coronary disease (?myocarditis or infiltrative disease).  There may be a component of tachycardia-mediated cardiomyopathy.  However, the cardiac MRI was done when he was in NSR.  In 9/16, echo showed EF increased to 40%.  Echo in 1/20 showed EF 45-50%.  Echo in 10/24 showed EF 40-45%, mild LV dilation, mildly decreased RV systolic function, moderate-severe MR with splay artifact and PISA ERO 0.44 cm^2, PASP 71 mmHg, dilated IVC.  The mitral regurgitation looked severe and appeared new compared to prior echo.  NYHA class III symptoms (worsened) and volume overloaded by exam and Optivol at last appointment.  Mitral regurgitation may be leading to worsening CHF.  GDMT has been limited in past by orthostatic symptoms and CKD.  He was brought in 11/4 for TEE to assess MR and RHC.  TEE confirmed severe functional MR with LV EF 45-50% and RHC showed markedly elevated PCWP and elevated RA pressure with mixed pulmonary venous/pulmonary arterial hypertension and low CO (CI 1.9).  Admitted him for diuresis/volume optimization +  consideration for inpatient Mitraclip post diuresis. He has diuresed well. CVP 5-6 today. Co-ox 56% on Milrinone 0.125.  - Transition to PO torsemide 60 mg daily  - Stop Milrinone today  - Hold Coreg for now with w/ marginal output   - Off spironolactone with renal dysfunction - Continue Jardiance 10 mg daily.  - He will stay off  Entresto with worsening renal function.  2. Atrial fibrillation: Paroxysmal. He had increased AF burden and was started on amiodarone.  He is in V-paced today  - Continue Xarelto 15 mg daily.  - Continue amiodarone 200 mg daily. LFTs and TSH WNL.   3. Hyperlipidemia: Continue Repatha as outpatient.  4. OSA: Uses CPAP.    5. PAD: Minimal claudication.  Continue Repatha.  6. CKD: Stage 3b.  Follows with nephrology in [place].  - Scr 2.17>2.43>2.75>2.61>2.46.  - Continue SGLT2i.  7. Mitral regurgitation: Echo in 10/24 showed new mitral regurgitation compared to 2022 echo.  The MR was not fully visualized, but I thought it was severe.  There was splay artifact and ERO by PISA is 0.44 cm^2. I worry that this contributes to his worsening symptoms. TEE confirms severe MR, looks like 3 jets, functional MR with restriction of the posterior leaflet in the area of P1/P2.  ERO 0.55 cm^2 from largest jet.  - he has been diuresed/optimized from HF standpoint. Repeat TTE was done yesterday but mitral valve was poorly visualized. MR does appear improved from prior echo 07/2023 but suspect underestimating severity of MR given poor visualization. Will need cMRI vs repeat TEE to further evaluate to determine need for MitraClip. Will defer to Dr. Dorrene German of Stay: [address] PA-C  08/08/2023, 10:22 AM  Advanced Heart Failure Team Pager [phone] (M-F; 7a - 5p)  Please contact [facility] for night-coverage after hours (5p -7a ) and weekends on [email or web address]  Patient seen with PA, agree with the above note.   Co-ox 56% on milrinone 0.125.  CVP 7 on my read.  No diuretic yesterday, creatinine lower at 2.46 today.   General: NAD Neck: No JVD, no thyromegaly or thyroid nodule.  Lungs: Clear to auscultation bilaterally with normal respiratory effort. CV: Nondisplaced PMI.  Heart regular S1/S2, no S3/S4, 2/6 HSM apex.  No peripheral edema.   Abdomen: Soft, nontender, no hepatosplenomegaly,  no distention.  Skin: Intact without lesions or rashes.  Neurologic: Alert and oriented x 3.  Psych: Normal affect. Extremities: No clubbing or cyanosis.  HEENT: Normal.   Volume status ok, will stop milrinone today and start torsemide 60 mg daily.   TTE yesterday was difficult study, cannot see mitral regurgitation well.  He cannot get a cardiac MRI for mitral regurgitation quantification as device is not MRI compatible.  I will arrange for repeat TEE tomorrow now that he is diuresed, discussed risks/benefits with patient and he agrees to procedure.   Marca Ancona 08/08/2023 11:37 AM

## 2023-08-08 NOTE — Care Management Important Message (Signed)
Important Message  Patient Details  Name: Chad Brown MRN: [id number] Date of Birth: 01/02/1941   Important Message Given:  Yes - Medicare IM     Dorena Bodo 08/08/2023, 1:05 PM

## 2023-08-08 NOTE — Progress Notes (Signed)
CRT device checked by [employer] Rep. Can is not MRI compatible. Unable to complete cMRI.   Robbie Lis, PA-C

## 2023-08-08 NOTE — Plan of Care (Signed)
  Problem: Education: Goal: Understanding of CV disease, CV risk reduction, and recovery process will improve Outcome: Progressing Goal: Individualized Educational Video(s) Outcome: Progressing   Problem: Activity: Goal: Ability to return to baseline activity level will improve Outcome: Progressing   Problem: Cardiovascular: Goal: Ability to achieve and maintain adequate cardiovascular perfusion will improve Outcome: Progressing Goal: Vascular access site(s) Level 0-1 will be maintained Outcome: Progressing   Problem: Health Behavior/Discharge Planning: Goal: Ability to safely manage health-related needs after discharge will improve Outcome: Progressing   Problem: Education: Goal: Knowledge of General Education information will improve Description: Including pain rating scale, medication(s)/side effects and non-pharmacologic comfort measures Outcome: Progressing   

## 2023-08-08 NOTE — Progress Notes (Signed)
Mobility Specialist Progress Note:    08/08/23 1614  Mobility  Activity Ambulated with assistance in hallway  Level of Assistance Standby assist, set-up cues, supervision of patient - no hands on  Assistive Device None  Distance Ambulated (ft) 100 ft  Activity Response Tolerated well  Mobility Referral Yes  $Mobility charge 1 Mobility  Mobility Specialist Start Time (ACUTE ONLY) 1403  Mobility Specialist Stop Time (ACUTE ONLY) 1414  Mobility Specialist Time Calculation (min) (ACUTE ONLY) 11 min   Pt received sitting EOB agreeable to mobility. No physical assistance needed during session. Distance limited d/t R ankle pain. Returned to room w/o fault. Requested to use BR post session. Left in BR instructed to use call light. RN notified.  Thompson Grayer Mobility Specialist  Please contact vis Secure Chat or  Rehab Office [phone]

## 2023-08-08 NOTE — Progress Notes (Addendum)
Advanced Heart Failure Rounding Note  PCP-Cardiologist: Marca Ancona, MD   Subjective:   IV diuretics held yesterday. Milrinone reduced to 0.125   Co-ox 56% today. 1.6L in UOP yesterday. Wt unchanged, stable at 208 lb. CVP 5-6  SCr 2.75>>2.61>>2.46  K 3.5.   Echo yesterday showed EF 45-50%. Mitral regurgitation not well visualized. Does appear improved from prior echo 07/2023 but suspect underestimating severity of MR given poor visualization. Suggest cardiac MRI to quantify MR severity.  He reports improvement in symptoms. Breathing better.  No current complaints.   Objective:   Weight Range: 94.7 kg Body mass index is 33.69 kg/m.   Vital Signs:   Temp:  [97.8 F (36.6 C)-99.3 F (37.4 C)] 99.3 F (37.4 C) (11/07 0405) Pulse Rate:  [60-67] 63 (11/07 0829) Resp:  [18] 18 (11/07 0829) BP: (100-127)/(50-75) 120/63 (11/07 0829) SpO2:  [90 %-96 %] 93 % (11/07 0829) Weight:  [94.7 kg] 94.7 kg (11/07 0405) Last BM Date : 08/07/23  Weight change: Filed Weights   08/06/23 0357 08/07/23 0541 08/08/23 0405  Weight: 94.6 kg 94.6 kg 94.7 kg    Intake/Output:   Intake/Output Summary (Last 24 hours) at 08/08/2023 1022 Last data filed at 08/08/2023 0932 Gross per 24 hour  Intake 811.34 ml  Output 1025 ml  Net -213.66 ml    Physical Exam   CVP 5-6  General:  Well appearing, elderly male laying in bed. No respiratory difficulty HEENT: normal Neck: supple. JVD 6 cm. Carotids 2+ bilat; no bruits. No lymphadenopathy or thyromegaly appreciated. Cor: PMI nondisplaced. Regular rate & rhythm. No rubs, gallops or murmurs. Lungs: clear bilaterally  Abdomen: soft, nontender, +distended. No hepatosplenomegaly. No bruits or masses. Good bowel sounds. Extremities: no cyanosis, clubbing, rash, edema + RUE PICC  Neuro: alert & oriented x 3, cranial nerves grossly intact. moves all 4 extremities w/o difficulty. Affect pleasant.   Telemetry   Paced 60s (personally reviewed)  EKG     N/A  Labs    CBC Recent Labs    08/05/23 1655 08/06/23 0457 08/07/23 0351 08/08/23 0600  WBC 6.2   < > 7.2 6.5  NEUTROABS 4.3  --   --   --   HGB 11.5*   < > 10.2* 9.8*  HCT 36.7*   < > 32.1* 31.0*  MCV 98.4   < > 97.9 97.2  PLT 148*   < > 140* 130*   < > = values in this interval not displayed.   Basic Metabolic Panel Recent Labs    29/52/84 1655 08/06/23 0457 08/06/23 0500 08/06/23 1456 08/07/23 0351 08/08/23 0600  NA 142   < >  --    < > 138 137  K 3.3*   < >  --    < > 3.5 3.5  CL 106   < >  --    < > 103 104  CO2 22   < >  --    < > 26 25  GLUCOSE 120*   < >  --    < > 119* 110*  BUN 28*   < >  --    < > 39* 41*  CREATININE 2.17*   < >  --    < > 2.61* 2.46*  CALCIUM 8.5*   < >  --    < > 7.7* 7.7*  MG 2.3  --  2.1  --   --   --    < > = values in  this interval not displayed.   Liver Function Tests Recent Labs    08/05/23 1655  AST 18  ALT 19  ALKPHOS 55  BILITOT 0.6  PROT 7.3  ALBUMIN 3.3*   No results for input(s): "LIPASE", "AMYLASE" in the last 72 hours. Cardiac Enzymes No results for input(s): "CKTOTAL", "CKMB", "CKMBINDEX", "TROPONINI" in the last 72 hours.  BNP: BNP (last 3 results) Recent Labs    10/18/22 0949 07/17/23 1227 08/05/23 1655  BNP 266.3* 410.5* 322.3*   ProBNP (last 3 results) No results for input(s): "PROBNP" in the last 8760 hours.  D-Dimer No results for input(s): "DDIMER" in the last 72 hours. Hemoglobin A1C No results for input(s): "HGBA1C" in the last 72 hours. Fasting Lipid Panel No results for input(s): "CHOL", "HDL", "LDLCALC", "TRIG", "CHOLHDL", "LDLDIRECT" in the last 72 hours. Thyroid Function Tests Recent Labs    08/05/23 1655  TSH 0.700   Imaging   ECHOCARDIOGRAM COMPLETE  Result Date: 08/07/2023    ECHOCARDIOGRAM REPORT   Patient Name:   Chad Brown Date of Exam: 08/07/2023 Medical Rec #:  [id number]        Height:       66.0 in Accession #:    [id number]       Weight:       208.6 lb Date  of Birth:  18-Apr-1941         BSA:          2.036 m Patient Age:    82 years         BP:           106/66 mmHg Patient Gender: M                HR:           88 bpm. Exam Location:  Inpatient Procedure: 2D Echo, Cardiac Doppler and Color Doppler Indications:    Mitral Valve Disorder  History:        Patient has prior history of Echocardiogram examinations, most                 recent 07/17/2023. Pacemaker and Defibrillator; Risk                 Factors:Hypertension, Diabetes and Sleep Apnea.  Sonographer:    Karma Ganja Referring Phys: [phone] Saahil Herbster S Nico Syme IMPRESSIONS  1. Technically difficult study. Left ventricular ejection fraction, by estimation, is 45 to 50%. The left ventricle has mildly decreased function. The left ventricle demonstrates global hypokinesis. The left ventricular internal cavity size was mildly dilated. There is mild left ventricular hypertrophy. Left ventricular diastolic parameters are indeterminate.  2. Right ventricular systolic function is normal. The right ventricular size is mildly enlarged. There is severely elevated pulmonary artery systolic pressure. The estimated right ventricular systolic pressure is 71.2 mmHg.  3. Left atrial size was mildly dilated.  4. Right atrial size was severely dilated.  5. The aortic valve is tricuspid. Aortic valve regurgitation is trivial. No aortic stenosis is present.  6. The mitral valve is abnormal. Mild mitral valve regurgitation. No evidence of mitral stenosis. Mitral regurgitation not well visualized on current study. Does appear improved from prior echo 07/2023 but suspect underestimating severity of MR given poor visualization. Suggest cardiac MRI to quantify MR severity FINDINGS  Left Ventricle: Left ventricular ejection fraction, by estimation, is 45 to 50%. The left ventricle has mildly decreased function. The left ventricle demonstrates global hypokinesis. The left ventricular internal cavity size was mildly  dilated. There is  mild left  ventricular hypertrophy. Left ventricular diastolic parameters are indeterminate. Right Ventricle: The right ventricular size is mildly enlarged. No increase in right ventricular wall thickness. Right ventricular systolic function is normal. There is severely elevated pulmonary artery systolic pressure. The tricuspid regurgitant velocity is 4.13 m/s, and with an assumed right atrial pressure of 3 mmHg, the estimated right ventricular systolic pressure is 71.2 mmHg. Left Atrium: Left atrial size was mildly dilated. Right Atrium: Right atrial size was severely dilated. Pericardium: There is no evidence of pericardial effusion. Presence of epicardial fat layer. Mitral Valve: The mitral valve is abnormal. Mild mitral valve regurgitation. No evidence of mitral valve stenosis. Tricuspid Valve: The tricuspid valve is normal in structure. Tricuspid valve regurgitation is trivial. Aortic Valve: The aortic valve is tricuspid. Aortic valve regurgitation is trivial. Aortic regurgitation PHT measures 676 msec. No aortic stenosis is present. Aortic valve mean gradient measures 3.0 mmHg. Aortic valve peak gradient measures 7.0 mmHg. Aortic valve area, by VTI measures 1.58 cm. Pulmonic Valve: The pulmonic valve was not well visualized. Pulmonic valve regurgitation is not visualized. Aorta: The aortic root is normal in size and structure. IAS/Shunts: The interatrial septum was not well visualized.  LEFT VENTRICLE PLAX 2D LVIDd:         6.00 cm      Diastology LVIDs:         4.40 cm      LV e' medial:    5.87 cm/s LV PW:         1.20 cm      LV E/e' medial:  17.4 LV IVS:        1.20 cm      LV e' lateral:   12.30 cm/s LVOT diam:     2.00 cm      LV E/e' lateral: 8.3 LV SV:         44 LV SV Index:   22 LVOT Area:     3.14 cm  LV Volumes (MOD) LV vol d, MOD A2C: 58.7 ml LV vol d, MOD A4C: 103.0 ml LV vol s, MOD A2C: 33.5 ml LV vol s, MOD A4C: 56.1 ml LV SV MOD A2C:     25.2 ml LV SV MOD A4C:     103.0 ml LV SV MOD BP:      35.4 ml  RIGHT VENTRICLE RV Basal diam:  4.20 cm RV S prime:     12.20 cm/s TAPSE (M-mode): 3.2 cm LEFT ATRIUM             Index        RIGHT ATRIUM           Index LA Vol (A2C):   75.6 ml 37.14 ml/m  RA Area:     30.30 cm LA Vol (A4C):   89.0 ml 43.72 ml/m  RA Volume:   115.00 ml 56.49 ml/m LA Biplane Vol: 83.3 ml 40.92 ml/m  AORTIC VALVE AV Area (Vmax):    1.80 cm AV Area (Vmean):   1.76 cm AV Area (VTI):     1.58 cm AV Vmax:           132.00 cm/s AV Vmean:          85.900 cm/s AV VTI:            0.278 m AV Peak Grad:      7.0 mmHg AV Mean Grad:      3.0 mmHg LVOT Vmax:  75.70 cm/s LVOT Vmean:        48.100 cm/s LVOT VTI:          0.140 m LVOT/AV VTI ratio: 0.50 AI PHT:            676 msec  AORTA Ao Root diam: 4.20 cm MITRAL VALVE                TRICUSPID VALVE MV Area (PHT): 4.01 cm     TR Peak grad:   68.2 mmHg MV Decel Time: 189 msec     TR Vmax:        413.00 cm/s MR Peak grad: 83.9 mmHg MR Mean grad: 48.0 mmHg     SHUNTS MR Vmax:      458.00 cm/s   Systemic VTI:  0.14 m MR Vmean:     314.0 cm/s    Systemic Diam: 2.00 cm MV E velocity: 102.00 cm/s MV A velocity: 46.70 cm/s MV E/A ratio:  2.18 Epifanio Lesches MD Electronically signed by Epifanio Lesches MD Signature Date/Time: 08/07/2023/6:03:40 PM    Final     Medications:   Scheduled Medications:  amiodarone  200 mg Oral Daily   Chlorhexidine Gluconate Cloth  6 each Topical Daily   dorzolamide-timolol  1 drop Both Eyes BID   empagliflozin  10 mg Oral Daily   escitalopram  20 mg Oral Daily   finasteride  5 mg Oral Daily   gabapentin  200 mg Oral BID   levothyroxine  137 mcg Oral Q0600   rivaroxaban  15 mg Oral Daily   sodium chloride flush  10-40 mL Intracatheter Q12H   sodium chloride flush  3 mL Intravenous Q12H   tamsulosin  0.4 mg Oral QPC supper    Infusions:  milrinone 0.125 mcg/kg/min (08/07/23 1130)    PRN Medications: acetaminophen, ondansetron (ZOFRAN) IV, sodium chloride flush, sodium chloride flush  Patient  Profile   KAMIR SELOVER is an 82 y.o. male with paroxysmal atrial fibrillation and chronic systolic CHF thought to be due to NiCM. Patient direct admitted post TEE and RHC. TEE with severe MR. RHC with low CO and significantly elevated right and left heart filling pressures. Admitted for lasix +milrinone gtt and structural heart eval for Mitraclip.   Assessment/Plan   1. Acute on chronic HF with mid-range EF: Nonischemic cardiomyopathy.  He has [employer] CRT-D device.  EF 15-20% on 6/16 TEE.  Cardiac MRI in 5/16 showed a LGE pattern that was not suggestive of coronary disease (?myocarditis or infiltrative disease).  There may be a component of tachycardia-mediated cardiomyopathy.  However, the cardiac MRI was done when he was in NSR.  In 9/16, echo showed EF increased to 40%.  Echo in 1/20 showed EF 45-50%.  Echo in 10/24 showed EF 40-45%, mild LV dilation, mildly decreased RV systolic function, moderate-severe MR with splay artifact and PISA ERO 0.44 cm^2, PASP 71 mmHg, dilated IVC.  The mitral regurgitation looked severe and appeared new compared to prior echo.  NYHA class III symptoms (worsened) and volume overloaded by exam and Optivol at last appointment.  Mitral regurgitation may be leading to worsening CHF.  GDMT has been limited in past by orthostatic symptoms and CKD.  He was brought in 11/4 for TEE to assess MR and RHC.  TEE confirmed severe functional MR with LV EF 45-50% and RHC showed markedly elevated PCWP and elevated RA pressure with mixed pulmonary venous/pulmonary arterial hypertension and low CO (CI 1.9).  Admitted him for diuresis/volume optimization +  consideration for inpatient Mitraclip post diuresis. He has diuresed well. CVP 5-6 today. Co-ox 56% on Milrinone 0.125.  - Transition to PO torsemide 60 mg daily  - Stop Milrinone today  - Hold Coreg for now with w/ marginal output   - Off spironolactone with renal dysfunction - Continue Jardiance 10 mg daily.  - He will stay off  Entresto with worsening renal function.  2. Atrial fibrillation: Paroxysmal. He had increased AF burden and was started on amiodarone.  He is in V-paced today  - Continue Xarelto 15 mg daily.  - Continue amiodarone 200 mg daily. LFTs and TSH WNL.   3. Hyperlipidemia: Continue Repatha as outpatient.  4. OSA: Uses CPAP.    5. PAD: Minimal claudication.  Continue Repatha.  6. CKD: Stage 3b.  Follows with nephrology in [place].  - Scr 2.17>2.43>2.75>2.61>2.46.  - Continue SGLT2i.  7. Mitral regurgitation: Echo in 10/24 showed new mitral regurgitation compared to 2022 echo.  The MR was not fully visualized, but I thought it was severe.  There was splay artifact and ERO by PISA is 0.44 cm^2. I worry that this contributes to his worsening symptoms. TEE confirms severe MR, looks like 3 jets, functional MR with restriction of the posterior leaflet in the area of P1/P2.  ERO 0.55 cm^2 from largest jet.  - he has been diuresed/optimized from HF standpoint. Repeat TTE was done yesterday but mitral valve was poorly visualized. MR does appear improved from prior echo 07/2023 but suspect underestimating severity of MR given poor visualization. Will need cMRI vs repeat TEE to further evaluate to determine need for MitraClip. Will defer to Dr. Dorrene German of Stay: [address] PA-C  08/08/2023, 10:22 AM  Advanced Heart Failure Team Pager [phone] (M-F; 7a - 5p)  Please contact [facility] for night-coverage after hours (5p -7a ) and weekends on [email or web address]  Patient seen with PA, agree with the above note.   Co-ox 56% on milrinone 0.125.  CVP 7 on my read.  No diuretic yesterday, creatinine lower at 2.46 today.   General: NAD Neck: No JVD, no thyromegaly or thyroid nodule.  Lungs: Clear to auscultation bilaterally with normal respiratory effort. CV: Nondisplaced PMI.  Heart regular S1/S2, no S3/S4, 2/6 HSM apex.  No peripheral edema.   Abdomen: Soft, nontender, no hepatosplenomegaly,  no distention.  Skin: Intact without lesions or rashes.  Neurologic: Alert and oriented x 3.  Psych: Normal affect. Extremities: No clubbing or cyanosis.  HEENT: Normal.   Volume status ok, will stop milrinone today and start torsemide 60 mg daily.   TTE yesterday was difficult study, cannot see mitral regurgitation well.  He cannot get a cardiac MRI for mitral regurgitation quantification as device is not MRI compatible.  I will arrange for repeat TEE tomorrow now that he is diuresed, discussed risks/benefits with patient and he agrees to procedure.   Marca Ancona 08/08/2023 11:37 AM

## 2023-08-09 ENCOUNTER — Inpatient Hospital Stay (HOSPITAL_COMMUNITY): Payer: Medicare PPO

## 2023-08-09 ENCOUNTER — Other Ambulatory Visit (HOSPITAL_COMMUNITY): Payer: Self-pay

## 2023-08-09 ENCOUNTER — Encounter (HOSPITAL_COMMUNITY)
Admission: RE | Disposition: A | Discharge status: Home / Self Care | Payer: Self-pay | Source: Home / Self Care | Attending: Cardiology

## 2023-08-09 ENCOUNTER — Inpatient Hospital Stay (HOSPITAL_COMMUNITY): Payer: Medicare PPO | Admitting: Anesthesiology

## 2023-08-09 DIAGNOSIS — I340 Nonrheumatic mitral (valve) insufficiency: Secondary | ICD-10-CM

## 2023-08-09 DIAGNOSIS — I5023 Acute on chronic systolic (congestive) heart failure: Secondary | ICD-10-CM | POA: Diagnosis not present

## 2023-08-09 HISTORY — PX: TRANSESOPHAGEAL ECHOCARDIOGRAM (CATH LAB): EP1270

## 2023-08-09 LAB — BASIC METABOLIC PANEL
Anion gap: 9 (ref 5–15)
BUN: 44 mg/dL — ABNORMAL HIGH (ref 8–23)
CO2: 26 mmol/L (ref 22–32)
Calcium: 7.9 mg/dL — ABNORMAL LOW (ref 8.9–10.3)
Chloride: 104 mmol/L (ref 98–111)
Creatinine, Ser: 2.62 mg/dL — ABNORMAL HIGH (ref 0.61–1.24)
GFR, Estimated: 24 mL/min — ABNORMAL LOW (ref >60)
Glucose, Bld: 100 mg/dL — ABNORMAL HIGH (ref 70–99)
Potassium: 3.8 mmol/L (ref 3.5–5.1)
Sodium: 139 mmol/L (ref 135–145)

## 2023-08-09 LAB — ECHO TEE
MV M vel: 4.57 m/s
MV Peak grad: 83.5 mm[Hg]
Radius: 1 cm

## 2023-08-09 LAB — COOXEMETRY PANEL
Carboxyhemoglobin: 0.8 % (ref 0.5–1.5)
Methemoglobin: <0.7 % (ref 0.0–1.5)
O2 Saturation: 51 %
Total hemoglobin: 10.8 g/dL — ABNORMAL LOW (ref 12.0–16.0)

## 2023-08-09 LAB — CBC
HCT: 32.4 % — ABNORMAL LOW (ref 39.0–52.0)
Hemoglobin: 10.2 g/dL — ABNORMAL LOW (ref 13.0–17.0)
MCH: 30.7 pg (ref 26.0–34.0)
MCHC: 31.5 g/dL (ref 30.0–36.0)
MCV: 97.6 fL (ref 80.0–100.0)
Platelets: 131 10*3/uL — ABNORMAL LOW (ref 150–400)
RBC: 3.32 MIL/uL — ABNORMAL LOW (ref 4.22–5.81)
RDW: 14.9 % (ref 11.5–15.5)
WBC: 6.9 10*3/uL (ref 4.0–10.5)
nRBC: 0 % (ref 0.0–0.2)

## 2023-08-09 SURGERY — TRANSESOPHAGEAL ECHOCARDIOGRAM (TEE) (CATHLAB)
Anesthesia: Monitor Anesthesia Care

## 2023-08-09 MED ORDER — PROPOFOL 500 MG/50ML IV EMUL
INTRAVENOUS | Status: DC | PRN
  Administered 2023-08-09: 100 ug/kg/min via INTRAVENOUS

## 2023-08-09 MED ORDER — SODIUM CHLORIDE 0.9 % IV SOLN: INTRAVENOUS | Status: DC | PRN

## 2023-08-09 MED ORDER — PROPOFOL 10 MG/ML IV BOLUS
INTRAVENOUS | Status: DC | PRN
  Administered 2023-08-09: 20 mg via INTRAVENOUS

## 2023-08-09 MED ORDER — LIDOCAINE 2% (20 MG/ML) 5 ML SYRINGE
INTRAMUSCULAR | Status: DC | PRN
  Administered 2023-08-09: 20 mg via INTRAVENOUS

## 2023-08-09 MED ORDER — POTASSIUM CHLORIDE CRYS ER 20 MEQ PO TBCR
20.0000 meq | EXTENDED_RELEASE_TABLET | Freq: Once | ORAL | Status: AC
  Administered 2023-08-09: 20 meq via ORAL
  Filled 2023-08-09: qty 1

## 2023-08-09 MED ORDER — TORSEMIDE 20 MG PO TABS
60.0000 mg | ORAL_TABLET | Freq: Every day | ORAL | 6 refills | Status: DC
  Filled 2023-08-09: qty 270, 90d supply, fill #0

## 2023-08-09 MED ORDER — TORSEMIDE 60 MG PO TABS: 60.0000 mg | ORAL_TABLET | Freq: Every day | ORAL | 6 refills | Status: DC

## 2023-08-09 NOTE — Transfer of Care (Signed)
Immediate Anesthesia Transfer of Care Note  Patient: Chad Brown  Procedure(s) Performed: TRANSESOPHAGEAL ECHOCARDIOGRAM  Patient Location: PACU  Anesthesia Type:MAC  Level of Consciousness: awake  Airway & Oxygen Therapy: Patient Spontanous Breathing and Patient connected to face mask oxygen  Post-op Assessment: Report given to RN and Post -op Vital signs reviewed and stable  Post vital signs: Reviewed and stable  Last Vitals:  Vitals Value Taken Time  BP 113/74 08/09/23 1305  Temp 36.6 C 08/09/23 1301  Pulse 59 08/09/23 1307  Resp 20 08/09/23 1307  SpO2 100 % 08/09/23 1307  Vitals shown include unfiled device data.  Last Pain:  Vitals:   08/09/23 1301  TempSrc: Temporal  PainSc: 0-No pain         Complications: No notable events documented.

## 2023-08-09 NOTE — Progress Notes (Addendum)
Advanced Heart Failure Rounding Note  PCP-Cardiologist: Marca Ancona, MD   Subjective:   Co-ox 51% today. Milrinone stopped 11/7. 1.8L in UOP yesterday. Wt unchanged, stable at 208 lb. CVP 10  SCr 2.75>>2.61>>2.46>>2.62  Echo 11/6 EF 45-50%. Mitral regurgitation not well visualized. Does appear improved from prior echo 07/2023 but suspect underestimating severity of MR given poor visualization. Unable to get cMRI as CRT device can incompatible with MRI  Plan for repeat TEE today  Feels fine, wants to go home.   Objective:   Weight Range: 94.7 kg Body mass index is 33.69 kg/m.   Vital Signs:   Temp:  [98 F (36.7 C)-98.8 F (37.1 C)] 98 F (36.7 C) (11/08 0820) Pulse Rate:  [61-69] 64 (11/08 0820) Resp:  [12-18] 12 (11/08 0820) BP: (108-122)/(65-75) 110/75 (11/08 0820) SpO2:  [94 %-98 %] 94 % (11/08 0030) Last BM Date : 08/07/23  Weight change: Filed Weights   08/06/23 0357 08/07/23 0541 08/08/23 0405  Weight: 94.6 kg 94.6 kg 94.7 kg    Intake/Output:   Intake/Output Summary (Last 24 hours) at 08/09/2023 0924 Last data filed at 08/08/2023 2300 Gross per 24 hour  Intake 360 ml  Output 1875 ml  Net -1515 ml    Physical Exam   CVP 10 General:  elderly appearing.  No respiratory difficulty HEENT: normal Neck: supple. JVD ~9 cm. Carotids 2+ bilat; no bruits. No lymphadenopathy or thyromegaly appreciated. Cor: PMI nondisplaced. Regular rate & rhythm. No rubs, gallops or murmurs. Lungs: clear Abdomen: soft, nontender, nondistended. No hepatosplenomegaly. No bruits or masses. Good bowel sounds. Extremities: no cyanosis, clubbing, rash, edema. PICC RUE Neuro: alert & oriented x 3, cranial nerves grossly intact. moves all 4 extremities w/o difficulty. Affect pleasant.   Telemetry   Paced 60s (personally reviewed)  EKG    N/A  Labs    CBC Recent Labs    08/08/23 0600 08/09/23 0613  WBC 6.5 6.9  HGB 9.8* 10.2*  HCT 31.0* 32.4*  MCV 97.2 97.6  PLT  130* 131*   Basic Metabolic Panel Recent Labs    16/10/96 0600 08/09/23 0613  NA 137 139  K 3.5 3.8  CL 104 104  CO2 25 26  GLUCOSE 110* 100*  BUN 41* 44*  CREATININE 2.46* 2.62*  CALCIUM 7.7* 7.9*   Liver Function Tests No results for input(s): "AST", "ALT", "ALKPHOS", "BILITOT", "PROT", "ALBUMIN" in the last 72 hours.  No results for input(s): "LIPASE", "AMYLASE" in the last 72 hours. Cardiac Enzymes No results for input(s): "CKTOTAL", "CKMB", "CKMBINDEX", "TROPONINI" in the last 72 hours.  BNP: BNP (last 3 results) Recent Labs    10/18/22 0949 07/17/23 1227 08/05/23 1655  BNP 266.3* 410.5* 322.3*   ProBNP (last 3 results) No results for input(s): "PROBNP" in the last 8760 hours.  D-Dimer No results for input(s): "DDIMER" in the last 72 hours. Hemoglobin A1C No results for input(s): "HGBA1C" in the last 72 hours. Fasting Lipid Panel No results for input(s): "CHOL", "HDL", "LDLCALC", "TRIG", "CHOLHDL", "LDLDIRECT" in the last 72 hours. Thyroid Function Tests No results for input(s): "TSH", "T4TOTAL", "T3FREE", "THYROIDAB" in the last 72 hours.  Invalid input(s): "FREET3"  Imaging   No results found.  Medications:   Scheduled Medications:  amiodarone  200 mg Oral Daily   Chlorhexidine Gluconate Cloth  6 each Topical Daily   dorzolamide-timolol  1 drop Both Eyes BID   empagliflozin  10 mg Oral Daily   escitalopram  20 mg Oral Daily  finasteride  5 mg Oral Daily   gabapentin  200 mg Oral BID   levothyroxine  137 mcg Oral Q0600   potassium chloride  20 mEq Oral Once   rivaroxaban  15 mg Oral Daily   sodium chloride flush  10 mL Intravenous Q12H   sodium chloride flush  10-40 mL Intracatheter Q12H   sodium chloride flush  3 mL Intravenous Q12H   tamsulosin  0.4 mg Oral QPC supper   torsemide  60 mg Oral Daily    Infusions:    PRN Medications: acetaminophen, ondansetron (ZOFRAN) IV, sodium chloride flush, sodium chloride flush  Patient Profile    Chad Brown is an 82 y.o. male with paroxysmal atrial fibrillation and chronic systolic CHF thought to be due to NiCM. Patient direct admitted post TEE and RHC. TEE with severe MR. RHC with low CO and significantly elevated right and left heart filling pressures. Admitted for lasix +milrinone gtt and structural heart eval for Mitraclip.   Assessment/Plan   1. Acute on chronic HF with mid-range EF: Nonischemic cardiomyopathy.  He has [employer] CRT-D device.  EF 15-20% on 6/16 TEE.  Cardiac MRI in 5/16 showed a LGE pattern that was not suggestive of coronary disease (?myocarditis or infiltrative disease).  There may be a component of tachycardia-mediated cardiomyopathy.  However, the cardiac MRI was done when he was in NSR.  In 9/16, echo showed EF increased to 40%.  Echo in 1/20 showed EF 45-50%.  Echo in 10/24 showed EF 40-45%, mild LV dilation, mildly decreased RV systolic function, moderate-severe MR with splay artifact and PISA ERO 0.44 cm^2, PASP 71 mmHg, dilated IVC.  The mitral regurgitation looked severe and appeared new compared to prior echo.  NYHA class III symptoms (worsened) and volume overloaded by exam and Optivol at last appointment.  Mitral regurgitation may be leading to worsening CHF.  GDMT has been limited in past by orthostatic symptoms and CKD.  He was brought in 11/4 for TEE to assess MR and RHC.  TEE confirmed severe functional MR with LV EF 45-50% and RHC showed markedly elevated PCWP and elevated RA pressure with mixed pulmonary venous/pulmonary arterial hypertension and low CO (CI 1.9).  Admitted him for diuresis/volume optimization + consideration for inpatient Mitraclip post diuresis. He has diuresed well. CVP 10 today. Co-ox 51% now off milrinone. Renal function, CVP and co-ox worst today off milrinone. Continue current plan for now, may need milrinone again. - Continue PO torsemide 60 mg daily  - Hold Coreg for now with w/ marginal output   - Off spironolactone with  renal dysfunction - Continue Jardiance 10 mg daily.  - He will stay off Entresto with worsening renal function.  - unable to get cMRI, CRT device can incompatible with MRI 2. Atrial fibrillation: Paroxysmal. He had increased AF burden and was started on amiodarone.  He is in V-paced today  - Continue Xarelto 15 mg daily.  - Continue amiodarone 200 mg daily. LFTs and TSH WNL.   3. Hyperlipidemia: Continue Repatha as outpatient.  4. OSA: Uses CPAP.    5. PAD: Minimal claudication.  Continue Repatha.  6. CKD: Stage 3b.  Follows with nephrology in [place].  - Scr 2.17>2.43>2.75>2.61>2.46>2.62.  - Continue SGLT2i.  7. Mitral regurgitation: Echo in 10/24 showed new mitral regurgitation compared to 2022 echo.  The MR was not fully visualized, but I thought it was severe.  There was splay artifact and ERO by PISA is 0.44 cm^2. I worry that this contributes to  his worsening symptoms. TEE confirms severe MR, looks like 3 jets, functional MR with restriction of the posterior leaflet in the area of P1/P2.  ERO 0.55 cm^2 from largest jet.  - he has been diuresed/optimized from HF standpoint. Repeat TTE was done yesterday but mitral valve was poorly visualized. MR does appear improved from prior echo 07/2023 but suspect underestimating severity of MR given poor visualization. Unable to get cMRI as its incompatible with MRI. Plan for repeat TEE later today.    Length of Stay: 4  Alen Bleacher, NP  08/09/2023, 9:24 AM  Advanced Heart Failure Team Pager [phone] (M-F; 7a - 5p)  Please contact [facility] for night-coverage after hours (5p -7a ) and weekends on [email or web address]  Patient seen with NP, agree with the above note.    He feels good today, diuresed well with torsemide yesterday.  Now off milrinone, early am co-ox was 51%.  CVP 9. Denies dyspnea, wants to go home.   Repeat TEE was done today, MR still appears severe (see echo section for report).   General: NAD Neck: JVP 8 cm, no thyromegaly or  thyroid nodule.  Lungs: Clear to auscultation bilaterally with normal respiratory effort. CV: Nondisplaced PMI.  Heart regular S1/S2, no S3/S4, 2/6 HSM LLSB/apex.  No peripheral edema.   Abdomen: Soft, nontender, no hepatosplenomegaly, no distention.  Skin: Intact without lesions or rashes.  Neurologic: Alert and oriented x 3.  Psych: Normal affect. Extremities: No clubbing or cyanosis.  HEENT: Normal.   Patient is now off milrinone and stable symptomatically though co-ox mildly low.  Repeat TEE shows severe MR.  I am going to let him go home today.   He will need followup in structural heart clinic to consider Mitraclip.   Meds for discharge: torsemide 60 mg daily, Xarelto 15 daily, amiodarone 200 daily, Jardiance 10 daily.  He has not been on KCl at home, K has run high at times.  Followup in CHF clinic and also in structural heart clinic to consider Mitraclip.   Marca Ancona 08/09/2023 12:54 PM

## 2023-08-09 NOTE — CV Procedure (Signed)
Procedure: TEE  Sedation: Per anesthesiology  Indication: Mitral regurgitation  Findings: Please see echo section for full report.  In short, there is still severe mitral regurgitation with 3 adjacent jets, primarily originating from the P1/P2 portion of the mitral valve. With moderate left atrial enlargement, possible atrial functional MR.   LV EF 45-50%.    Chad Brown 08/09/2023 12:49 PM

## 2023-08-09 NOTE — Anesthesia Postprocedure Evaluation (Signed)
Anesthesia Post Note  Patient: Chad Brown  Procedure(s) Performed: TRANSESOPHAGEAL ECHOCARDIOGRAM     Patient location during evaluation: PACU Anesthesia Type: MAC Level of consciousness: awake and alert Pain management: pain level controlled Vital Signs Assessment: post-procedure vital signs reviewed and stable Respiratory status: spontaneous breathing, nonlabored ventilation, respiratory function stable and patient connected to nasal cannula oxygen Cardiovascular status: stable and blood pressure returned to baseline Postop Assessment: no apparent nausea or vomiting Anesthetic complications: no   No notable events documented.  Last Vitals:  Vitals:   08/09/23 1325 08/09/23 1401  BP: 117/74 123/72  Pulse: 61 61  Resp: 16 18  Temp:  36.7 C  SpO2: (!) 87% 100%    Last Pain:  Vitals:   08/09/23 1401  TempSrc: Oral  PainSc:                  Mariann Barter

## 2023-08-09 NOTE — Plan of Care (Signed)
  Problem: Cardiovascular: Goal: Ability to achieve and maintain adequate cardiovascular perfusion will improve Outcome: Progressing   Problem: Education: Goal: Knowledge of General Education information will improve Description: Including pain rating scale, medication(s)/side effects and non-pharmacologic comfort measures Outcome: Progressing   Problem: Clinical Measurements: Goal: Respiratory complications will improve Outcome: Progressing   Problem: Activity: Goal: Risk for activity intolerance will decrease Outcome: Progressing

## 2023-08-09 NOTE — Discharge Summary (Signed)
Advanced Heart Failure Team  Discharge Summary   Patient ID: Chad Brown MRN: [id number], DOB/AGE: Sep 08, 1941 82 y.o. Admit date: 08/05/2023 D/C date:     08/09/2023   Primary Discharge Diagnoses:  Acute on chronic HF with mid-range EF:  Mitral Regurgitation  Secondary Discharge Diagnoses:  Atrial fibrillation Hyperlipidemia OSA CKD PAD   Hospital Course:    Patient presented to [facility] Clinic 10/16 for follow up with increasing shortness of breath and orthopnea. Echo showed EF 40-45%,mild LV dilation, mildly decreased RV systolic function, moderate-severe MR with splay artifact and PISA ERO 0.44 cm^2, PASP 71 mmHg, dilated IVC. Medtronic device interrogation showing increased volume. Lasix was increased to 80 qam/40 qpm and scheduled for outpatient TEE/RHC to assess MR.   Patient returned for scheduled RHC/TEE. Given results he was admitted for low cardio output and significantly elevated filling pressures with severe MR. He was started on Milrinone 0.25 and Lasix 12mg /hr. Beta blocker stopped in the setting of low output. GDMT this admission limited by his renal function. Structural Heart team was consulted for surgical evaluation for mitral valve repair. Per their recommendation patient was optimized and diuresed to CVP <5 and repeat TEE was performed. TEE still showed severe atrial function MR with 3 adjacent jets and moderate LAE. Structural team to follow as outpatient for mitral transcatheter edge-to-edge repair. Patient discharging on Torsemide 60mg  daily and Jardiance 10mg . Scheduled for follow up with AHF Clinic and Structural Heart Clinic.    Hospital Course by Problem List:  1. Acute on chronic HF with mid-range EF: Nonischemic cardiomyopathy.  He has Medtronic CRT-D device.  EF 15-20% on 6/16 TEE.  Cardiac MRI in 5/16 showed a LGE pattern that was not suggestive of coronary disease (?myocarditis or infiltrative disease).  There may be a component of tachycardia-mediated  cardiomyopathy.  However, the cardiac MRI was done when he was in NSR.  In 9/16, echo showed EF increased to 40%.  Echo in 1/20 showed EF 45-50%.  Echo in 10/24 showed EF 40-45%, mild LV dilation, mildly decreased RV systolic function, moderate-severe MR with splay artifact and PISA ERO 0.44 cm^2, PASP 71 mmHg, dilated IVC. The mitral regurgitation looked severe and appeared new compared to prior echo.  NYHA class III symptoms (worsened) and volume overloaded by exam and Optivol at last appointment.  Mitral regurgitation may be leading to worsening CHF.  GDMT has been limited in past by orthostatic symptoms and CKD.  He was brought in 11/4 for TEE to assess MR and RHC.  TEE confirmed severe functional MR with LV EF 45-50% and RHC showed markedly elevated PCWP and elevated RA pressure with mixed pulmonary venous/pulmonary arterial hypertension and low CO (CI 1.9).  Admitted him for diuresis/volume optimization + consideration for inpatient Mitraclip post diuresis. He has diuresed well. Renal function, CVP, and co-ox marginal off milrinone. Will need MR repair per Structural Team. - unable to get cMRI, CRT device incompatible with MRI - Hold Coreg for now with w/ marginal output   - Off spironolactone with renal dysfunction - He will stay off Entresto with worsening renal function.  - Jardiance 10 mg daily.  - Torsemide 60 mg daily  2. Atrial fibrillation: Paroxysmal. He had increased AF burden and was started on amiodarone. Vpaced this admission. - Xarelto 15 mg daily.  - Amiodarone 200 mg daily 3. Hyperlipidemia: Continue Repatha 4. OSA: Uses CPAP.    5. PAD: Minimal claudication.  Continue Repatha.  6. CKD: Stage 3b.  Follows with nephrology in  [place]. sCr at discharge 2.6 7. Mitral regurgitation: Echo in 10/24 showed new mitral regurgitation compared to 2022 echo.  The MR was not fully visualized, but I thought it was severe.  There was splay artifact and ERO by PISA is 0.44 cm^2. I worry that  this contributes to his worsening symptoms. TEE confirms severe MR, looks like 3 jets, functional MR with restriction of the posterior leaflet in the area of P1/P2.  ERO 0.55 cm^2 from largest jet.  - he has been diuresed/optimized from HF standpoint. Repeat TTE was done yesterday but mitral valve was poorly visualized. MR does appear improved from prior echo 07/2023 but suspect underestimating severity of MR given poor visualization. Unable to get cMRI as its incompatible with MRI. Repeat TEE still showing severe MR.   Discharge Weight Range: 93-94kg Discharge Vitals: Blood pressure 123/72, pulse 61, temperature 98.1 F (36.7 C), temperature source Oral, resp. rate 18, height 5\' 6"  (1.676 m), weight 93.8 kg, SpO2 100%.  Labs: Lab Results  Component Value Date   WBC 6.9 08/09/2023   HGB 10.2 (L) 08/09/2023   HCT 32.4 (L) 08/09/2023   MCV 97.6 08/09/2023   PLT 131 (L) 08/09/2023    Recent Labs  Lab 08/05/23 1655 08/06/23 0457 08/09/23 0613  NA 142   < > 139  K 3.3*   < > 3.8  CL 106   < > 104  CO2 22   < > 26  BUN 28*   < > 44*  CREATININE 2.17*   < > 2.62*  CALCIUM 8.5*   < > 7.9*  PROT 7.3  --   --   BILITOT 0.6  --   --   ALKPHOS 55  --   --   ALT 19  --   --   AST 18  --   --   GLUCOSE 120*   < > 100*   < > = values in this interval not displayed.   Lab Results  Component Value Date   CHOL 103 07/17/2023   HDL 28 (L) 07/17/2023   LDLCALC 44 07/17/2023   TRIG 155 (H) 07/17/2023   BNP (last 3 results) Recent Labs    10/18/22 0949 07/17/23 1227 08/05/23 1655  BNP 266.3* 410.5* 322.3*    ProBNP (last 3 results) No results for input(s): "PROBNP" in the last 8760 hours.   Diagnostic Studies/Procedures   EP STUDY  Result Date: 08/09/2023 See surgical note for result.  ECHOCARDIOGRAM COMPLETE  Result Date: 08/07/2023    ECHOCARDIOGRAM REPORT   Patient Name:   ISAEL MIKLAS Date of Exam: 08/07/2023 Medical Rec #:  [id number]        Height:       66.0 in  Accession #:    [id number]       Weight:       208.6 lb Date of Birth:  08/24/1941         BSA:          2.036 m Patient Age:    82 years         BP:           106/66 mmHg Patient Gender: M                HR:           88 bpm. Exam Location:  Inpatient Procedure: 2D Echo, Cardiac Doppler and Color Doppler Indications:    Mitral Valve Disorder  History:  Patient has prior history of Echocardiogram examinations, most                 recent 07/17/2023. Pacemaker and Defibrillator; Risk                 Factors:Hypertension, Diabetes and Sleep Apnea.  Sonographer:    Karma Ganja Referring Phys: [phone] DALTON S MCLEAN IMPRESSIONS  1. Technically difficult study. Left ventricular ejection fraction, by estimation, is 45 to 50%. The left ventricle has mildly decreased function. The left ventricle demonstrates global hypokinesis. The left ventricular internal cavity size was mildly dilated. There is mild left ventricular hypertrophy. Left ventricular diastolic parameters are indeterminate.  2. Right ventricular systolic function is normal. The right ventricular size is mildly enlarged. There is severely elevated pulmonary artery systolic pressure. The estimated right ventricular systolic pressure is 71.2 mmHg.  3. Left atrial size was mildly dilated.  4. Right atrial size was severely dilated.  5. The aortic valve is tricuspid. Aortic valve regurgitation is trivial. No aortic stenosis is present.  6. The mitral valve is abnormal. Mild mitral valve regurgitation. No evidence of mitral stenosis. Mitral regurgitation not well visualized on current study. Does appear improved from prior echo 07/2023 but suspect underestimating severity of MR given poor visualization. Suggest cardiac MRI to quantify MR severity FINDINGS  Left Ventricle: Left ventricular ejection fraction, by estimation, is 45 to 50%. The left ventricle has mildly decreased function. The left ventricle demonstrates global hypokinesis. The left ventricular  internal cavity size was mildly dilated. There is  mild left ventricular hypertrophy. Left ventricular diastolic parameters are indeterminate. Right Ventricle: The right ventricular size is mildly enlarged. No increase in right ventricular wall thickness. Right ventricular systolic function is normal. There is severely elevated pulmonary artery systolic pressure. The tricuspid regurgitant velocity is 4.13 m/s, and with an assumed right atrial pressure of 3 mmHg, the estimated right ventricular systolic pressure is 71.2 mmHg. Left Atrium: Left atrial size was mildly dilated. Right Atrium: Right atrial size was severely dilated. Pericardium: There is no evidence of pericardial effusion. Presence of epicardial fat layer. Mitral Valve: The mitral valve is abnormal. Mild mitral valve regurgitation. No evidence of mitral valve stenosis. Tricuspid Valve: The tricuspid valve is normal in structure. Tricuspid valve regurgitation is trivial. Aortic Valve: The aortic valve is tricuspid. Aortic valve regurgitation is trivial. Aortic regurgitation PHT measures 676 msec. No aortic stenosis is present. Aortic valve mean gradient measures 3.0 mmHg. Aortic valve peak gradient measures 7.0 mmHg. Aortic valve area, by VTI measures 1.58 cm. Pulmonic Valve: The pulmonic valve was not well visualized. Pulmonic valve regurgitation is not visualized. Aorta: The aortic root is normal in size and structure. IAS/Shunts: The interatrial septum was not well visualized.  LEFT VENTRICLE PLAX 2D LVIDd:         6.00 cm      Diastology LVIDs:         4.40 cm      LV e' medial:    5.87 cm/s LV PW:         1.20 cm      LV E/e' medial:  17.4 LV IVS:        1.20 cm      LV e' lateral:   12.30 cm/s LVOT diam:     2.00 cm      LV E/e' lateral: 8.3 LV SV:         44 LV SV Index:   22 LVOT Area:  3.14 cm  LV Volumes (MOD) LV vol d, MOD A2C: 58.7 ml LV vol d, MOD A4C: 103.0 ml LV vol s, MOD A2C: 33.5 ml LV vol s, MOD A4C: 56.1 ml LV SV MOD A2C:     25.2  ml LV SV MOD A4C:     103.0 ml LV SV MOD BP:      35.4 ml RIGHT VENTRICLE RV Basal diam:  4.20 cm RV S prime:     12.20 cm/s TAPSE (M-mode): 3.2 cm LEFT ATRIUM             Index        RIGHT ATRIUM           Index LA Vol (A2C):   75.6 ml 37.14 ml/m  RA Area:     30.30 cm LA Vol (A4C):   89.0 ml 43.72 ml/m  RA Volume:   115.00 ml 56.49 ml/m LA Biplane Vol: 83.3 ml 40.92 ml/m  AORTIC VALVE AV Area (Vmax):    1.80 cm AV Area (Vmean):   1.76 cm AV Area (VTI):     1.58 cm AV Vmax:           132.00 cm/s AV Vmean:          85.900 cm/s AV VTI:            0.278 m AV Peak Grad:      7.0 mmHg AV Mean Grad:      3.0 mmHg LVOT Vmax:         75.70 cm/s LVOT Vmean:        48.100 cm/s LVOT VTI:          0.140 m LVOT/AV VTI ratio: 0.50 AI PHT:            676 msec  AORTA Ao Root diam: 4.20 cm MITRAL VALVE                TRICUSPID VALVE MV Area (PHT): 4.01 cm     TR Peak grad:   68.2 mmHg MV Decel Time: 189 msec     TR Vmax:        413.00 cm/s MR Peak grad: 83.9 mmHg MR Mean grad: 48.0 mmHg     SHUNTS MR Vmax:      458.00 cm/s   Systemic VTI:  0.14 m MR Vmean:     314.0 cm/s    Systemic Diam: 2.00 cm MV E velocity: 102.00 cm/s MV A velocity: 46.70 cm/s MV E/A ratio:  2.18 Epifanio Lesches MD Electronically signed by Epifanio Lesches MD Signature Date/Time: 08/07/2023/6:03:40 PM    Final     Discharge Medications   Allergies as of 08/09/2023       Reactions   Pravastatin Sodium Other (See Comments)   Joint and muscle pain   Azithromycin Diarrhea        Medication List     STOP taking these medications    acetaminophen 500 MG tablet Commonly known as: TYLENOL   calcium carbonate 500 MG chewable tablet Commonly known as: TUMS - dosed in mg elemental calcium   carvedilol 3.125 MG tablet Commonly known as: COREG   furosemide 20 MG tablet Commonly known as: LASIX   Lokelma 10 g Pack packet Generic drug: sodium zirconium cyclosilicate   nitroGLYCERIN 0.4 MG SL tablet Commonly known as:  NITROSTAT   PROBIOTIC PO       TAKE these medications    amiodarone 200 MG tablet Commonly known as: PACERONE Take  1 tablet (200 mg total) by mouth daily.   cholecalciferol 25 MCG (1000 UNIT) tablet Commonly known as: VITAMIN D3 Take 1,000 Units by mouth in the morning.   dorzolamide-timolol 2-0.5 % ophthalmic solution Commonly known as: COSOPT Place 1 drop into the right eye 2 (two) times daily.   empagliflozin 10 MG Tabs tablet Commonly known as: Jardiance Take 1 tablet (10 mg total) by mouth daily.   escitalopram 20 MG tablet Commonly known as: LEXAPRO Take 1 tablet (20 mg total) by mouth daily. What changed: when to take this   finasteride 5 MG tablet Commonly known as: PROSCAR Take 1 tablet (5 mg total) by mouth daily. What changed: when to take this   FOLIC ACID PO Take 1 tablet by mouth daily.   gabapentin 100 MG capsule Commonly known as: NEURONTIN Take 200 mg by mouth 2 (two) times daily.   IRON PO Take 9 mg by mouth in the morning. Chewable   latanoprost 0.005 % ophthalmic solution Commonly known as: XALATAN Place 1 drop into the right eye daily at 12 noon.   levothyroxine 137 MCG tablet Commonly known as: SYNTHROID TAKE ONE TABLET DAILY BEFORE BREAKFAST. must make appointment FOR refills   Polyethyl Glycol-Propyl Glycol 0.4-0.3 % Soln Place 1 drop into the left eye in the morning and at bedtime.   Repatha SureClick 140 MG/ML Soaj Generic drug: Evolocumab INJECT 140MG  ( ) INTO SKIN EVERY 2 WEEKS   Rhopressa 0.02 % Soln Generic drug: Netarsudil Dimesylate Place 1 drop into the right eye at bedtime.   tamsulosin 0.4 MG Caps capsule Commonly known as: FLOMAX Take 0.4 mg by mouth at bedtime.   Torsemide 60 MG Tabs Take 60 mg by mouth daily. Start taking on: August 10, 2023   VITAMIN B 12 PO Take 500 mcg by mouth in the morning.   Xarelto 15 MG Tabs tablet Generic drug: Rivaroxaban TAKE (1) TABLET DAILY WITH SUPPER.         Disposition   The patient will be discharged in stable condition to home. Discharge Instructions     (HEART FAILURE PATIENTS) Call MD:  Anytime you have any of the following symptoms: 1) 3 pound weight gain in 24 hours or 5 pounds in 1 week 2) shortness of breath, with or without a dry hacking cough 3) swelling in the hands, feet or stomach 4) if you have to sleep on extra pillows at night in order to breathe.   Complete by: As directed    Diet - low sodium heart healthy   Complete by: As directed    Heart Failure patients record your daily weight using the same scale at the same time of day   Complete by: As directed    Increase activity slowly   Complete by: As directed        Follow-up Information     [name], MD Follow up on 08/14/2023.   Specialty: Cardiology Why: Your appointment is at 11AM. Please arrive by 10:45AM Contact information: [address] [place] [address] [phone]         [facility] Follow up on 08/15/2023.   Specialty: Cardiology Why: Advanced Heart Failure Clinic at 9:40am Contact information: [address] [place] [address] [phone]        Cleatis Polka., MD Follow up.   Specialty: Internal Medicine Why: The office will call patient. Contact information: [address] [facility] [place]  [address] [phone]                  Duration of Discharge Encounter: Greater than 35 minutes   Signed, [place] Lateesha Bezold  08/09/2023, 2:12 PM

## 2023-08-09 NOTE — Anesthesia Preprocedure Evaluation (Signed)
Anesthesia Evaluation  Patient identified by MRN, date of birth, ID band Patient awake    Reviewed: Allergy & Precautions, NPO status , Patient's Chart, lab work & pertinent test results, reviewed documented beta blocker date and time   History of Anesthesia Complications Negative for: history of anesthetic complications  Airway Mallampati: II  TM Distance: >3 FB Neck ROM: Limited    Dental  (+) Upper Dentures, Lower Dentures   Pulmonary sleep apnea , pneumonia, former smoker   breath sounds clear to auscultation       Cardiovascular hypertension, + CAD, + Past MI, + Peripheral Vascular Disease and +CHF  + dysrhythmias + Cardiac Defibrillator + Valvular Problems/Murmurs MR  Rhythm:Regular Rate:Normal  EF 45-50   Neuro/Psych neg Seizures PSYCHIATRIC DISORDERS  Depression     Neuromuscular disease    GI/Hepatic ,GERD  ,,(+) neg Cirrhosis        Endo/Other  Hypothyroidism    Renal/GU Renal disease     Musculoskeletal  (+) Arthritis ,    Abdominal   Peds  Hematology  (+) Blood dyscrasia, anemia   Anesthesia Other Findings   Reproductive/Obstetrics                              Anesthesia Physical Anesthesia Plan  ASA: 3  Anesthesia Plan: MAC   Post-op Pain Management:    Induction: Intravenous  PONV Risk Score and Plan: 1 and Ondansetron  Airway Management Planned:   Additional Equipment:   Intra-op Plan:   Post-operative Plan:   Informed Consent: I have reviewed the patients History and Physical, chart, labs and discussed the procedure including the risks, benefits and alternatives for the proposed anesthesia with the patient or authorized representative who has indicated his/her understanding and acceptance.     Dental advisory given  Plan Discussed with: CRNA  Anesthesia Plan Comments:          Anesthesia Quick Evaluation

## 2023-08-09 NOTE — TOC Transition Note (Signed)
Transition of Care [facility]) - CM/SW Discharge Note   Patient Details  Name: Chad Brown MRN: [id number] Date of Birth: May 10, 1941  Transition of Care [facility]) CM/SW Contact:  Leone Haven, RN Phone Number: 08/09/2023, 2:28 PM   Clinical Narrative:    NCM spoke with patient at bedside, he is for dc today back home with wife. Who will be transporting him per patient. He states he has everything he needs. Self ambulatory.      Barriers to Discharge: Continued Medical Work up   Patient Goals and CMS Choice      Discharge Placement                         Discharge Plan and Services Additional resources added to the After Visit Summary for                                       Social Determinants of Health (SDOH) Interventions SDOH Screenings   Food Insecurity: No Food Insecurity (08/05/2023)  Housing: Low Risk  (08/05/2023)  Transportation Needs: No Transportation Needs (08/05/2023)  Utilities: Not At Risk (08/05/2023)  Depression (PHQ2-9): Low Risk  (11/03/2020)  Physical Activity: Inactive (11/03/2020)  Tobacco Use: Medium Risk (08/05/2023)     Readmission Risk Interventions     No data to display

## 2023-08-09 NOTE — Interval H&P Note (Signed)
History and Physical Interval Note:  08/09/2023 12:20 PM  Chad Brown  has presented today for surgery, with the diagnosis of MITRAL REGURGITATION.  The various methods of treatment have been discussed with the patient and family. After consideration of risks, benefits and other options for treatment, the patient has consented to  Procedure(s): TRANSESOPHAGEAL ECHOCARDIOGRAM (N/A) as a surgical intervention.  The patient's history has been reviewed, patient examined, no change in status, stable for surgery.  I have reviewed the patient's chart and labs.  Questions were answered to the patient's satisfaction.     Lilana Blasko [employer]

## 2023-08-09 NOTE — Anesthesia Procedure Notes (Signed)
Procedure Name: MAC Date/Time: 08/09/2023 12:28 PM  Performed by: Thomasene Ripple, CRNAPre-anesthesia Checklist: Patient identified, Emergency Drugs available, Suction available, Patient being monitored and Timeout performed Patient Re-evaluated:Patient Re-evaluated prior to induction Oxygen Delivery Method: Simple face mask Preoxygenation: Pre-oxygenation with 100% oxygen Induction Type: IV induction

## 2023-08-12 ENCOUNTER — Encounter (HOSPITAL_COMMUNITY): Payer: Self-pay | Admitting: Cardiology

## 2023-08-12 DIAGNOSIS — D696 Thrombocytopenia, unspecified: Secondary | ICD-10-CM | POA: Diagnosis not present

## 2023-08-12 DIAGNOSIS — N184 Chronic kidney disease, stage 4 (severe): Secondary | ICD-10-CM | POA: Diagnosis not present

## 2023-08-12 DIAGNOSIS — I480 Paroxysmal atrial fibrillation: Secondary | ICD-10-CM | POA: Diagnosis not present

## 2023-08-12 DIAGNOSIS — I340 Nonrheumatic mitral (valve) insufficiency: Secondary | ICD-10-CM | POA: Diagnosis not present

## 2023-08-14 ENCOUNTER — Other Ambulatory Visit: Payer: Self-pay

## 2023-08-14 ENCOUNTER — Encounter: Payer: Self-pay | Admitting: Internal Medicine

## 2023-08-14 ENCOUNTER — Ambulatory Visit: Payer: Medicare PPO | Attending: Internal Medicine | Admitting: Internal Medicine

## 2023-08-14 ENCOUNTER — Telehealth: Payer: Self-pay

## 2023-08-14 VITALS — BP 86/64 | HR 60 | Ht 66.0 in | Wt 202.6 lb

## 2023-08-14 DIAGNOSIS — I5022 Chronic systolic (congestive) heart failure: Secondary | ICD-10-CM | POA: Diagnosis not present

## 2023-08-14 DIAGNOSIS — I340 Nonrheumatic mitral (valve) insufficiency: Secondary | ICD-10-CM | POA: Diagnosis not present

## 2023-08-14 DIAGNOSIS — N1831 Chronic kidney disease, stage 3a: Secondary | ICD-10-CM

## 2023-08-14 DIAGNOSIS — I4819 Other persistent atrial fibrillation: Secondary | ICD-10-CM

## 2023-08-14 DIAGNOSIS — Z95810 Presence of automatic (implantable) cardiac defibrillator: Secondary | ICD-10-CM | POA: Diagnosis not present

## 2023-08-14 NOTE — Patient Instructions (Signed)
Medication Instructions:  ?No changes today. ?*If you need a refill on your cardiac medications before your next appointment, please call your pharmacy* ? ? ?Lab Work: ?none ?If you have labs (blood work) drawn today and your tests are completely normal, you will receive your results only by: ?MyChart Message (if you have MyChart) OR ?A paper copy in the mail ?If you have any lab test that is abnormal or we need to change your treatment, we will call you to review the results. ? ? ?Testing/Procedures: ?none ? ? ?Follow-Up: ?Per Structural Heart Team ? ?

## 2023-08-14 NOTE — Telephone Encounter (Signed)
Per [employer] review of 08/09/2023 TEE:  "For patient Chad Brown: This is Mixed MR  In the Bicaval view, the fossa looks reasonable for a transseptal puncture. The LA dimensions are large enough for steering and straddle of the Clip Delivery System. There are concerns of a possible leaflet perforation on the anterior leaflet(discussed with Dr. Izora Ribas).  The MR jet(s) are central and lateral. The posterior leaflet measures 14 mm in the 125 LVOT grasping views. Gradient measures 1 mmHg (60BPM) and MVA measures 4.55cm2. Based on this information, I'd recommend starting with an NTW or XTW(location/clip size dependent on pre-procedural TEE findings) and assessing for residual/gradient."

## 2023-08-14 NOTE — Progress Notes (Signed)
Patient ID: CALDWELL TRIANO MRN: [id number] DOB/AGE: [birth year] 82 y.o.  Primary Care Physician:Shaw, Netta Corrigan., MD Primary Cardiologist: Marca Ancona, MD  FOCUSED CARDIOVASCULAR PROBLEM LIST:   Longstanding atrial fibrillation Status post multiple cardioversions and ablation 2021 On Xarelto Severe mitral regurgitation Atrial functional Resolved nonischemic cardiomyopathy Status post CRT-D EF 45 to 50% TEE 2024 Left bundle branch block CKD stage III    HISTORY OF PRESENT ILLNESS: The patient is a 82 y.o. male with the indicated medical history here for recommendations regarding his severe mitral regurgitation.  The patient was admitted to [facility] after an elective TEE and cardiac catheterization demonstrated severe mitral regurgitation due to atrial functional MR.  His right heart catheterization demonstrated mean RA pressure of 13 mmHg and a mean wedge pressure of 30 mmHg with V waves to 50 mmHg.  He was started on Lasix and milrinone.  He underwent repeat TEE after optimization which continued to show significant mitral regurgitation.  He is here to discuss these findings after he was discharged from the hospital.    He is here with his wife today.  Overall he feels better than when he was admitted to the hospital.  However he still feels short of breath when he walks at times.  He feels like he cannot do everything that he needs to do on his property.  He lives with his wife on about half an acre.  He would like to be much more active taking care of things around the house.  He is limited by shortness of breath.  He however denies any peripheral edema.  He is sleeping well at night.  He did sustain a fall but it was because he missed the bed when he was sitting down.  He has had some lightheadedness when he goes from sitting to standing quickly.  Fortunately has had no frank syncope.  He has had no severe bleeding while on the blood thinner.  Overall he endorses NYHA  class II symptoms of shortness of breath and fatigue.  He has both upper and lower dentures.  Past Medical History:  Diagnosis Date   Adenomatous colon polyp    tubular   AICD (automatic cardioverter/defibrillator) present 03/08/2015   MDT CRTD dual pacemaker and defib   Anemia    iron deficient   Arthritis    "about all my joints; hands, knees, back" (03/08/2015)   Atherosclerosis    Cataract    left eye small   Cholelithiasis    gallstones   Chronic systolic CHF (congestive heart failure) (HCC)    a. New dx 12/2012 ? NICM, may be r/t afib. b. Nuc 03/2013 - normal;  c. 03/2015 TEE EF 15-20%.   Colon polyp, hyperplastic 01/2015   removed precancerous lesions   Depression    Diverticulosis    Dysrhythmia    afib   GERD (gastroesophageal reflux disease)    Glaucoma    right eye   Hyperlipidemia    Hypertension    Melanoma of eye (HCC) 2000's   "right; it's never been biopsied"   Melanoma of lower leg (HCC) 2015   "left; right at my knee"   Myocardial infarction (HCC) 1998   OSA (obstructive sleep apnea) 01/04/2016   no longer tolerates cpap   Peripheral vision loss, right 2006   Persistent atrial fibrillation (HCC)    a. Dx 12/2012, s/p TEE/DCCV 01/26/13. b. On Xarelto (CHA2DS2VASc = 3);  c. 03/2015 TEE (EF 15-20%, no LAA thrombus)  and DCCV - amio increased to 200 mg bid.   Pneumonia    Urinary hesitancy due to benign prostatic hypertrophy     Past Surgical History:  Procedure Laterality Date   ATRIAL FIBRILLATION ABLATION N/A 11/03/2019   Procedure: ATRIAL FIBRILLATION ABLATION;  Surgeon: Hillis Range, MD;  Location: [facility] INVASIVE CV LAB;  Service: Cardiovascular;  Laterality: N/A;   BACK SURGERY     upper back, cannot turn neck well   CARDIAC CATHETERIZATION  1998   CARDIOVERSION N/A 01/26/2013   Procedure: CARDIOVERSION;  Surgeon: Lewayne Bunting, MD;  Location: [facility] ENDOSCOPY;  Service: Cardiovascular;  Laterality: N/A;   CARDIOVERSION N/A 03/23/2015   Procedure:  CARDIOVERSION;  Surgeon: Jake Bathe, MD;  Location: [facility] ENDOSCOPY;  Service: Cardiovascular;  Laterality: N/A;   CARDIOVERSION N/A 08/14/2017   Procedure: CARDIOVERSION;  Surgeon: Wendall Stade, MD;  Location: [facility] ENDOSCOPY;  Service: Cardiovascular;  Laterality: N/A;   CATARACT EXTRACTION Right ~ 2006   COLONOSCOPY WITH PROPOFOL N/A 02/10/2015   Procedure: COLONOSCOPY WITH PROPOFOL;  Surgeon: Iva Boop, MD;  Location: [facility] ENDOSCOPY;  Service: Endoscopy;  Laterality: N/A;   COLONOSCOPY WITH PROPOFOL N/A 08/07/2016   Procedure: COLONOSCOPY WITH PROPOFOL;  Surgeon: Iva Boop, MD;  Location: [facility] ENDOSCOPY;  Service: Endoscopy;  Laterality: N/A;   ENTEROSCOPY N/A 08/17/2015   Procedure: ENTEROSCOPY;  Surgeon: Iva Boop, MD;  Location: [facility] ENDOSCOPY;  Service: Endoscopy;  Laterality: N/A;   EP IMPLANTABLE DEVICE N/A 03/08/2015   MDT Ovidio Kin CRT-D for nonischemic CM by Dr Johney Frame for primary prevention   GLAUCOMA SURGERY Right ~ 2006   "put 3 stents in to drain fluid" (03/08/2015) not successful, sent to [facility] to try to get last stent out   HOT HEMOSTASIS N/A 08/07/2016   Procedure: HOT HEMOSTASIS (ARGON PLASMA COAGULATION/BICAP);  Surgeon: Iva Boop, MD;  Location: [facility] ENDOSCOPY;  Service: Endoscopy;  Laterality: N/A;   INCISION AND DRAINAGE ABSCESS POSTERIOR CERVICALSPINE  05/2012   JOINT REPLACEMENT     MELANOMA EXCISION Left 2015   "lower leg; right at my knee"   REFRACTIVE SURGERY Right ~ 2006 X 2   "twice; both done at [facility]" (03/08/2015   RIGHT HEART CATH N/A 08/05/2023   Procedure: RIGHT HEART CATH;  Surgeon: Laurey Morale, MD;  Location: [facility] INVASIVE CV LAB;  Service: Cardiovascular;  Laterality: N/A;   SURGERY SCROTAL / TESTICULAR Right 1990's   TEE WITHOUT CARDIOVERSION N/A 01/26/2013   Procedure: TRANSESOPHAGEAL ECHOCARDIOGRAM (TEE);  Surgeon: Lewayne Bunting, MD;  Location: [facility] ENDOSCOPY;  Service: Cardiovascular;  Laterality: N/A;  [name] anes. /    TEE WITHOUT CARDIOVERSION  N/A 10/05/2014   Procedure: TRANSESOPHAGEAL ECHOCARDIOGRAM (TEE)  with cardioversion;  Surgeon: Vesta Mixer, MD;  Location: [facility] ENDOSCOPY;  Service: Cardiovascular;  Laterality: N/A;  12:52 synched cardioversion at 120 joules,...afib to SR...12 lead EKG ordered.Marland KitchenMarland KitchenCardiozem d/c'ed per MD verbal order at SR   TEE WITHOUT CARDIOVERSION N/A 03/23/2015   Procedure: TRANSESOPHAGEAL ECHOCARDIOGRAM (TEE);  Surgeon: Jake Bathe, MD;  Location: [facility] ENDOSCOPY;  Service: Cardiovascular;  Laterality: N/A;   TEE WITHOUT CARDIOVERSION N/A 11/02/2019   Procedure: TRANSESOPHAGEAL ECHOCARDIOGRAM (TEE);  Surgeon: Wendall Stade, MD;  Location: [facility] ENDOSCOPY;  Service: Cardiovascular;  Laterality: N/A;   TEE WITHOUT CARDIOVERSION N/A 08/05/2023   Procedure: TRANSESOPHAGEAL ECHOCARDIOGRAM;  Surgeon: Laurey Morale, MD;  Location: [facility] INVASIVE CV LAB;  Service: Cardiovascular;  Laterality: N/A;   THORACIC SPINE SURGERY  03/2000   "ground  calcium deposits from upper thoracic" (01/26/2013)   THYROIDECTOMY N/A 05/11/2021   Procedure: TOTAL THYROIDECTOMY;  Surgeon: Darnell Level, MD;  Location: [facility] ORS;  Service: General;  Laterality: N/A;   TOTAL HIP ARTHROPLASTY Right 06/2007   TRANSESOPHAGEAL ECHOCARDIOGRAM (CATH LAB) N/A 08/09/2023   Procedure: TRANSESOPHAGEAL ECHOCARDIOGRAM;  Surgeon: Laurey Morale, MD;  Location: [facility] INVASIVE CV LAB;  Service: Cardiovascular;  Laterality: N/A;    Family History  Problem Relation Age of Onset   Kidney disease Mother    Heart disease Mother        MI, open heart   Diabetes Mother        dialysis   Leukemia Father    Colon cancer Paternal Uncle    Lung cancer Paternal Uncle        x 2   Prostate cancer Paternal Uncle    Diabetes Maternal Grandmother    Heart attack Maternal Uncle    Diabetes Maternal Aunt        x 3   Diabetes Maternal Uncle    Thyroid disease Sister     Social History   Socioeconomic History   Marital status: Married    Spouse name: Not on file   Number  of children: 3   Years of education: Not on file   Highest education level: Not on file  Occupational History   Occupation: retired  Tobacco Use   Smoking status: Former    Current packs/day: 0.00    Average packs/day: 3.0 packs/day for 48.0 years (144.0 ttl pk-yrs)    Types: Cigarettes    Start date: 09/28/1952    Quit date: 09/28/2000    Years since quitting: 22.8   Smokeless tobacco: Never  Vaping Use   Vaping status: Never Used  Substance and Sexual Activity   Alcohol use: No   Drug use: No   Sexual activity: Not Currently  Other Topics Concern   Not on file  Social History Narrative   Pt lives in [place] with spouse.  3 children are grown and healthy.   Retired.  Ran a country store for 30 years, previously worked in the [employer] for 16 years   Social Determinants of Corporate investment banker Strain: Not on [employer] Insecurity: No Food Insecurity (08/05/2023)   Hunger Vital Sign    Worried About Running Out of Food in the Last Year: Never true    Ran Out of Food in the Last Year: Never true  Transportation Needs: No Transportation Needs (08/05/2023)   PRAPARE - Administrator, Civil Service (Medical): No    Lack of Transportation (Non-Medical): No  Physical Activity: Inactive (11/03/2020)   Exercise Vital Sign    Days of Exercise per Week: 0 days    Minutes of Exercise per Session: 0 min  Stress: Not on file  Social Connections: Not on file  Intimate Partner Violence: Not At Risk (08/05/2023)   Humiliation, Afraid, Rape, and Kick questionnaire    Fear of Current or Ex-Partner: No    Emotionally Abused: No    Physically Abused: No    Sexually Abused: No     Prior to Admission medications   Medication Sig Start Date End Date Taking? Authorizing Provider  amiodarone (PACERONE) 200 MG tablet Take 1 tablet (200 mg total) by mouth daily. 02/20/23   Eustace Pen, PA-C  cholecalciferol (VITAMIN D3) 25 MCG (1000 UNIT) tablet Take 1,000  Units by mouth in the morning.  [provider]  Cyanocobalamin (VITAMIN B 12 PO) Take 500 mcg by mouth in the morning.    [provider]  dorzolamide-timolol (COSOPT) 22.3-6.8 MG/ML ophthalmic solution Place 1 drop into the right eye 2 (two) times daily.    [provider]  empagliflozin (JARDIANCE) 10 MG TABS tablet Take 1 tablet (10 mg total) by mouth daily. 10/18/22   Laurey Morale, MD  escitalopram (LEXAPRO) 20 MG tablet Take 1 tablet (20 mg total) by mouth daily. Patient taking differently: Take 20 mg by mouth daily in the afternoon. 06/23/19   Bensimhon, Bevelyn Buckles, MD  Evolocumab (REPATHA SURECLICK) 140 MG/ML SOAJ INJECT 140MG  ( ) INTO SKIN EVERY 2 WEEKS 11/28/22   Laurey Morale, MD  Ferrous Sulfate (IRON PO) Take 9 mg by mouth in the morning. Chewable    [provider]  finasteride (PROSCAR) 5 MG tablet Take 1 tablet (5 mg total) by mouth daily. Patient taking differently: Take 5 mg by mouth every evening. 06/22/19   Bensimhon, Bevelyn Buckles, MD  FOLIC ACID PO Take 1 tablet by mouth daily.    [provider]  gabapentin (NEURONTIN) 100 MG capsule Take 200 mg by mouth 2 (two) times daily.     [provider]  latanoprost (XALATAN) 0.005 % ophthalmic solution Place 1 drop into the right eye daily at 12 noon.    [provider]  levothyroxine (SYNTHROID) 137 MCG tablet TAKE ONE TABLET DAILY BEFORE BREAKFAST. must make appointment FOR refills 07/11/23   Roma Kayser, MD  Polyethyl Glycol-Propyl Glycol 0.4-0.3 % SOLN Place 1 drop into the left eye in the morning and at bedtime.    [provider]  RHOPRESSA 0.02 % SOLN Place 1 drop into the right eye at bedtime. 10/11/21   [provider]  tamsulosin (FLOMAX) 0.4 MG CAPS Take 0.4 mg by mouth at bedtime.     [provider]  torsemide (DEMADEX) 20 MG tablet Take 3 tablets (60 mg total) by mouth daily. 08/10/23   Laurey Morale, MD  XARELTO 15 MG  TABS tablet TAKE (1) TABLET DAILY WITH SUPPER. 02/28/23   Laurey Morale, MD    Allergies  Allergen Reactions   Pravastatin Sodium Other (See Comments)    Joint and muscle pain   Azithromycin Diarrhea    REVIEW OF SYSTEMS:  General: no fevers/chills/night sweats Eyes: no blurry vision, diplopia, or amaurosis ENT: no sore throat or hearing loss Resp: no cough, wheezing, or hemoptysis CV: no edema or palpitations GI: no abdominal pain, nausea, vomiting, diarrhea, or constipation GU: no dysuria, frequency, or hematuria Skin: no rash Neuro: no headache, numbness, tingling, or weakness of extremities Musculoskeletal: no joint pain or swelling Heme: no bleeding, DVT, or easy bruising Endo: no polydipsia or polyuria  BP (!) 86/64   Pulse 60   Ht 5\' 6"  (1.676 m)   Wt 202 lb 9.6 oz (91.9 kg)   SpO2 92%   BMI 32.70 kg/m   PHYSICAL EXAM: GEN:  AO x 3 in no acute distress HEENT: normal Dentition: Dentures Neck: JVP normal. +2 carotid upstrokes without bruits. No thyromegaly. Lungs: equal expansion, clear bilaterally CV: Apex is discrete and nondisplaced, irregular RRR with very soft systolic murmur at apex Abd: soft, non-tender, non-distended; no bruit; positive bowel sounds Ext: no edema, ecchymoses, or cyanosis Vascular: 2+ femoral pulses, 2+ radial pulses       Skin: warm and dry without rash Neuro: CN II-XII grossly intact; motor and sensory  grossly intact    DATA AND STUDIES:  EKG: EKG October 2024 demonstrates V pacing  EKG Interpretation Date/Time:    Ventricular Rate:    PR Interval:    QRS Duration:    QT Interval:    QTC Calculation:   R Axis:      Text Interpretation:          Cardiac Studies & Procedures   CARDIAC CATHETERIZATION  CARDIAC CATHETERIZATION 08/05/2023  Narrative 1. Elevated right and left heart filling pressures. 2. Prominent V-waves in the PCWP tracing suggestive of significant mitral regurgitation. 3. Low cardiac output, CI  1.9. 4. 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  I am going to admit for diuresis given markedly elevated PCWP.     ECHOCARDIOGRAM  ECHOCARDIOGRAM COMPLETE 08/07/2023  Narrative ECHOCARDIOGRAM REPORT    Patient Name:   Chad Brown Date of Exam: 08/07/2023 Medical Rec #:  [id number]        Height:       66.0 in Accession #:    [id number]       Weight:       208.6 lb Date of Birth:  06/14/1941         BSA:          2.036 m Patient Age:    82 years         BP:           106/66 mmHg Patient Gender: M                HR:           88 bpm. Exam Location:  Inpatient  Procedure: 2D Echo, Cardiac Doppler and Color Doppler  Indications:    Mitral Valve Disorder  History:        Patient has prior history of Echocardiogram examinations, most recent 07/17/2023. Pacemaker and Defibrillator; Risk Factors:Hypertension, Diabetes and Sleep Apnea.  Sonographer:    Karma Ganja Referring Phys: [phone] DALTON S MCLEAN  IMPRESSIONS   1. Technically difficult study. Left ventricular ejection fraction, by estimation, is 45 to 50%. The left ventricle has mildly decreased function. The left ventricle demonstrates global hypokinesis. The left ventricular internal cavity size was mildly dilated. There is mild left ventricular hypertrophy. Left ventricular diastolic parameters are indeterminate. 2. Right ventricular systolic function is normal. The right ventricular size is mildly enlarged. There is severely elevated pulmonary artery systolic pressure. The estimated right ventricular systolic pressure is 71.2 mmHg. 3. Left atrial size was mildly dilated. 4. Right atrial size was severely dilated. 5. The aortic valve is tricuspid. Aortic valve regurgitation is trivial. No aortic stenosis is present. 6. The mitral valve is abnormal. Mild mitral valve regurgitation. No evidence of mitral stenosis. Mitral regurgitation not well visualized on current study. Does appear improved from prior  echo 07/2023 but suspect underestimating severity of MR given poor visualization. Suggest cardiac MRI to quantify MR severity  FINDINGS Left Ventricle: Left ventricular ejection fraction, by estimation, is 45 to 50%. The left ventricle has mildly decreased function. The left ventricle demonstrates global hypokinesis. The left ventricular internal cavity size was mildly dilated. There is mild left ventricular hypertrophy. Left ventricular diastolic parameters are indeterminate.  Right Ventricle: The right ventricular size is mildly enlarged. No increase in right ventricular wall thickness. Right ventricular systolic function is normal. There is severely elevated pulmonary artery systolic pressure. The tricuspid regurgitant velocity is 4.13 m/s, and with an assumed right atrial pressure of 3 mmHg, the estimated right ventricular  systolic pressure is 71.2 mmHg.  Left Atrium: Left atrial size was mildly dilated.  Right Atrium: Right atrial size was severely dilated.  Pericardium: There is no evidence of pericardial effusion. Presence of epicardial fat layer.  Mitral Valve: The mitral valve is abnormal. Mild mitral valve regurgitation. No evidence of mitral valve stenosis.  Tricuspid Valve: The tricuspid valve is normal in structure. Tricuspid valve regurgitation is trivial.  Aortic Valve: The aortic valve is tricuspid. Aortic valve regurgitation is trivial. Aortic regurgitation PHT measures 676 msec. No aortic stenosis is present. Aortic valve mean gradient measures 3.0 mmHg. Aortic valve peak gradient measures 7.0 mmHg. Aortic valve area, by VTI measures 1.58 cm.  Pulmonic Valve: The pulmonic valve was not well visualized. Pulmonic valve regurgitation is not visualized.  Aorta: The aortic root is normal in size and structure.  IAS/Shunts: The interatrial septum was not well visualized.   LEFT VENTRICLE PLAX 2D LVIDd:         6.00 cm      Diastology LVIDs:         4.40 cm      LV e'  medial:    5.87 cm/s LV PW:         1.20 cm      LV E/e' medial:  17.4 LV IVS:        1.20 cm      LV e' lateral:   12.30 cm/s LVOT diam:     2.00 cm      LV E/e' lateral: 8.3 LV SV:         44 LV SV Index:   22 LVOT Area:     3.14 cm  LV Volumes (MOD) LV vol d, MOD A2C: 58.7 ml LV vol d, MOD A4C: 103.0 ml LV vol s, MOD A2C: 33.5 ml LV vol s, MOD A4C: 56.1 ml LV SV MOD A2C:     25.2 ml LV SV MOD A4C:     103.0 ml LV SV MOD BP:      35.4 ml  RIGHT VENTRICLE RV Basal diam:  4.20 cm RV S prime:     12.20 cm/s TAPSE (M-mode): 3.2 cm  LEFT ATRIUM             Index        RIGHT ATRIUM           Index LA Vol (A2C):   75.6 ml 37.14 ml/m  RA Area:     30.30 cm LA Vol (A4C):   89.0 ml 43.72 ml/m  RA Volume:   115.00 ml 56.49 ml/m LA Biplane Vol: 83.3 ml 40.92 ml/m AORTIC VALVE AV Area (Vmax):    1.80 cm AV Area (Vmean):   1.76 cm AV Area (VTI):     1.58 cm AV Vmax:           132.00 cm/s AV Vmean:          85.900 cm/s AV VTI:            0.278 m AV Peak Grad:      7.0 mmHg AV Mean Grad:      3.0 mmHg LVOT Vmax:         75.70 cm/s LVOT Vmean:        48.100 cm/s LVOT VTI:          0.140 m LVOT/AV VTI ratio: 0.50 AI PHT:            676 msec  AORTA Ao Root diam: 4.20  cm  MITRAL VALVE                TRICUSPID VALVE MV Area (PHT): 4.01 cm     TR Peak grad:   68.2 mmHg MV Decel Time: 189 msec     TR Vmax:        413.00 cm/s MR Peak grad: 83.9 mmHg MR Mean grad: 48.0 mmHg     SHUNTS MR Vmax:      458.00 cm/s   Systemic VTI:  0.14 m MR Vmean:     314.0 cm/s    Systemic Diam: 2.00 cm MV E velocity: 102.00 cm/s MV A velocity: 46.70 cm/s MV E/A ratio:  2.18  Epifanio Lesches MD Electronically signed by Epifanio Lesches MD Signature Date/Time: 08/07/2023/6:03:40 PM    Final   TEE  ECHO TEE 08/09/2023  Narrative TRANSESOPHOGEAL ECHO REPORT    Patient Name:   Chad Brown Date of Exam: 08/09/2023 Medical Rec #:  [id number]        Height:       66.0  in Accession #:    [id number]       Weight:       206.8 lb Date of Birth:  1941-04-09         BSA:          2.028 m Patient Age:    82 years         BP:           108/65 mmHg Patient Gender: M                HR:           60 bpm. Exam Location:  Inpatient  Procedure: 3D Echo, Transesophageal Echo, Cardiac Doppler and Color Doppler  Indications:     Mitral Regurgitation  History:         Patient has prior history of Echocardiogram examinations, most recent 08/07/2023. CHF and Cardiomyopathy, Defibrillator; Arrythmias:Atrial Fibrillation.  Sonographer:     Lucendia Herrlich RCS Sonographer#2:   Milbert Coulter RDCS Referring Phys:  [id number] Roxy Horseman SIMMONS Diagnosing Phys: Wilfred Lacy  PROCEDURE: After discussion of the risks and benefits of a TEE, an informed consent was obtained from the patient. The transesophogeal probe was passed without difficulty through the esophogus of the patient. Imaged were obtained with the patient in a left lateral decubitus position. Sedation performed by different physician. The patient was monitored while under deep sedation. Anesthestetic sedation was provided intravenously by Anesthesiology: 304mg  of Propofol, 20mg  of Lidocaine. Image quality was good. The patient developed no complications during the procedure.  IMPRESSIONS   1. Left ventricular ejection fraction, by estimation, is 45 to 50%. The left ventricle has mildly decreased function. The left ventricle demonstrates global hypokinesis. There is mild concentric left ventricular hypertrophy. 2. Peak RV-RA gradient 43 mmHg. Right ventricular systolic function is mildly reduced. The right ventricular size is normal. 3. Left atrial size was moderately dilated. No left atrial/left atrial appendage thrombus was detected. 4. Right atrial size was mildly dilated. 5. No PFO or ASD by color doppler. 6. There is restriction of the posterior mitral leaflet predominantly in the P1/P2 region with several  (looks like 3 predominant) jets of mitral regurgitation. PISA interrogation of the largest jet gives ERO 0.42 cm^2. 3-D vena contracta area 0.78 cm^2 incorporating all jets. There appeared to be slight systolic flow reversal in the pulmonary vein PW doppler signal. Severe mitral valve regurgitation, possibly atrial functional MR. No evidence  of mitral stenosis. The mean mitral valve gradient is 1.0 mmHg. 7. The tricuspid valve is abnormal. Tricuspid valve regurgitation is moderate. 8. The aortic valve is tricuspid. Aortic valve regurgitation is trivial. No aortic stenosis is present.  FINDINGS Left Ventricle: Left ventricular ejection fraction, by estimation, is 45 to 50%. The left ventricle has mildly decreased function. The left ventricle demonstrates global hypokinesis. The left ventricular internal cavity size was normal in size. There is mild concentric left ventricular hypertrophy.  Right Ventricle: Peak RV-RA gradient 43 mmHg. The right ventricular size is normal. No increase in right ventricular wall thickness. Right ventricular systolic function is mildly reduced.  Left Atrium: Left atrial size was moderately dilated. No left atrial/left atrial appendage thrombus was detected.  Right Atrium: Right atrial size was mildly dilated.  Pericardium: There is no evidence of pericardial effusion.  Mitral Valve: There is restriction of the posterior mitral leaflet predominantly in the P1/P2 region with several (looks like 3 predominant) jets of mitral regurgitation. PISA interrogation of the largest jet gives ERO 0.42 cm^2. 3-D vena contracta area 0.78 cm^2 incorporating all jets. There appeared to be slight systolic flow reversal in the pulmonary vein PW doppler signal. The mitral valve is abnormal. Severe mitral valve regurgitation. No evidence of mitral valve stenosis. MV peak gradient, 3.0 mmHg. The mean mitral valve gradient is 1.0 mmHg.  Tricuspid Valve: The tricuspid valve is abnormal.  Tricuspid valve regurgitation is moderate.  Aortic Valve: The aortic valve is tricuspid. Aortic valve regurgitation is trivial. No aortic stenosis is present.  Pulmonic Valve: The pulmonic valve was normal in structure. Pulmonic valve regurgitation is not visualized.  Aorta: The aortic root is normal in size and structure.  IAS/Shunts: No PFO or ASD by color doppler.  Additional Comments: A device lead is visualized in the right ventricle. Spectral Doppler performed.  MITRAL VALVE                  TRICUSPID VALVE MV Peak grad: 3.0 mmHg        TR Peak grad:   43.3 mmHg MV Mean grad: 1.0 mmHg        TR Vmax:        329.00 cm/s MV Vmax:      0.86 m/s MV Vmean:     42.0 cm/s MR Peak grad:    83.5 mmHg MR Mean grad:    55.0 mmHg MR Vmax:         457.00 cm/s MR Vmean:        345.0 cm/s MR PISA:         6.28 cm MR PISA Eff ROA: 42 mm MR PISA Radius:  1.00 cm  Dalton McleanMD Electronically signed by Wilfred Lacy Signature Date/Time: 08/09/2023/4:23:38 PM    Final     CARDIAC MRI  MR CARDIAC MORPHOLOGY W WO CONTRAST 02/21/2015  Narrative CLINICAL DATA:  Cardiomyopathy Assess EF  EXAM: CARDIAC MRI  TECHNIQUE: The patient was scanned on a 1.5 Tesla GE magnet. A dedicated cardiac coil was used. Functional imaging was done using Fiesta sequences. 2,3, and 4 chamber views were done to assess for RWMA's. Modified Simpson's rule using a short axis stack was used to calculate an ejection fraction on a dedicated work [employer]. The patient received 30 cc of Multihance. After 10 minutes inversion recovery sequences were used to assess for infiltration and scar tissue.  CONTRAST:  30 cc Multihance  FINDINGS: There was moderate LAE. RA and RV were normal  in size and function There was no ASD/VSD. No pericardial effusion. The ascending aorta was normal at 3.2 cm Aortic valve trileaflet with mild calcification and systolic turbulence. Mild AR. Mitral valve  structurally normal. The LV was mildly dilated. There was diffuse hypokinesis worse in the anterolateral wall. The quantitative EF was 34% (EDV 166 ESV 109 SV 57 cc) Delayed enhancement images showed small punctate areas of enhancement in anterior septum and basal inferior wall  IMPRESSION: 1) Mild LVE global hypokinesis worse in the anterolateral wall EF 34%  2) Small punctate areas of hyperenhancement on delayed gadolinium images in anteroseptum and basal inferior wall  3) Moderate LAE  4) AV sclerosis with mild AR  5) No LAA thrombus  Charlton Haws   Electronically Signed By: Charlton Haws M.D. On: 02/21/2015 10:18         08/05/2023: ALT 19; B Natriuretic Peptide 322.3; TSH 0.700 08/06/2023: Magnesium 2.1 08/09/2023: BUN 44; Creatinine, Ser 2.62; Hemoglobin 10.2; Platelets 131; Potassium 3.8; Sodium 139   STS RISK CALCULATOR: pending  NHYA CLASS: 2     ASSESSMENT AND PLAN:   Nonrheumatic mitral valve regurgitation  Chronic systolic CHF (congestive heart failure) (HCC)  Persistent atrial fibrillation (HCC)  Biventricular ICD (implantable cardioverter-defibrillator) in place  Stage 3a chronic kidney disease (HCC)  The patient has developed severe symptomatic mitral regurgitation due to an atrial functional etiology.  I reviewed his TEE after medical optimization which still demonstrates severe mitral regurgitation with anatomy that looks to be feasible for mitral transcatheter edge-to-edge repair.  The patient is still symptomatic.  I fear that if this is not addressed the patient will continue to progress with worsening heart failure.  He has been optimized as best as he can with medical therapy and has a CRT-D device in.  I have discussed the concept of mitral transcatheter edge-to-edge repair the patient is interested in pursuing this to address his mitral regurgitation.  We will arrange for this procedure on November 20.  All questions were answered.  I have  personally reviewed the patients imaging data as summarized above.  I have reviewed the natural history of mitral regurgitation with the patient and family members who are present today. We have discussed the limitations of medical therapy and the poor prognosis associated with symptomatic mitral regurgitation. We have also reviewed potential treatment options, including palliative medical therapy, conventional mitral surgery, and transcatheter mitral edge-to-edge repair. We discussed treatment options in the context of this patient's specific comorbid medical conditions.   All of the patient's questions were answered today. Will make further recommendations based on the results of studies outlined above.   Total time spent with patient today 50 minutes. This includes reviewing records, evaluating the patient and coordinating care.   [name], MD  08/14/2023 1:19 PM    [facility] [address] [place], [place], [place]  [address] Phone: [phone]; Fax: [phone]

## 2023-08-15 ENCOUNTER — Encounter (HOSPITAL_COMMUNITY): Payer: Medicare PPO | Admitting: Cardiology

## 2023-08-15 ENCOUNTER — Encounter (HOSPITAL_COMMUNITY): Payer: Self-pay

## 2023-08-16 ENCOUNTER — Telehealth: Payer: Self-pay

## 2023-08-16 ENCOUNTER — Encounter: Payer: Self-pay | Admitting: Cardiology

## 2023-08-16 ENCOUNTER — Ambulatory Visit
Admission: RE | Admit: 2023-08-16 | Discharge: 2023-08-16 | Disposition: A | Discharge status: Home / Self Care | Payer: Medicare PPO | Source: Ambulatory Visit | Attending: Internal Medicine | Admitting: Internal Medicine

## 2023-08-16 DIAGNOSIS — R079 Chest pain, unspecified: Secondary | ICD-10-CM | POA: Diagnosis not present

## 2023-08-16 DIAGNOSIS — R0781 Pleurodynia: Secondary | ICD-10-CM

## 2023-08-16 NOTE — Telephone Encounter (Signed)
CXR results: Narrative & Impression  CLINICAL DATA:  Chest pain after fall.   EXAM: CHEST - 2 VIEW   COMPARISON:  August 06, 2023.   FINDINGS: Stable cardiomediastinal silhouette. Left-sided fibrillator is unchanged. Left lung is clear. Mildly improved right lung opacities are noted suggesting improving pneumonia. Bony thorax is unremarkable.   IMPRESSION: Mildly decreased right lung opacities are noted suggesting improving pneumonia. Continued follow-up is recommended to ensure resolution and rule out underlying neoplasm.     Electronically Signed   By: Lupita Raider M.D.   On: 08/16/2023 16:09     Reviewed results with the patient. He understands his CXR is improved from 08/06/2023 CXR and no fractures were noted.  He states when he is sitting, he has no pain. But when he coughs or sneezes his L shoulder blade has pain 10/10. He can "touch" the area and reproduce the pain. Tylenol has helped. He is worried about potentially having to postpone the plan for Clip on 11/20 and gen change 12/16, and he understands that with no acute processes it is recommended to proceed as scheduled. Discussed with Dr. Lynnette Caffey, who will call the patient and discuss results and plan later this afternoon. Phone number: [phone]

## 2023-08-16 NOTE — Telephone Encounter (Signed)
Mr. Congdon reports that he fell prior to seeing Dr. Lynnette Caffey 11/13 and did not discuss the fall with him. He was sitting on the edge of his bed and thinks he may have just slipped off. He does not think he lost consciousness.  He did not hit his head, but he does have bruising on his elbow and his ribs hurt when he coughs.   Spoke with Dr. Lynnette Caffey. Will bring Mr. Hartung in for STAT CXR today.  He is scheduled for MitraClip 11/20 and BiV ICD gen change 12/16. Unless there is an acute abnormality shown on the CXR, he will likely need to proceed with plan.  Called and spoke with patient and confirmed plan. He will come today for STAT CXR. Confirmed again he has no other symptoms other than rib pain from his fall. He also now states his back hurts occasionally as well. He was grateful for call and agreed with plan. Will await results and call him once read.

## 2023-08-16 NOTE — Progress Notes (Addendum)
Device orders received for upcoming surgery. Shari Prows and Whitney Post, reps, notified via email.

## 2023-08-16 NOTE — Progress Notes (Signed)
PERIOPERATIVE PRESCRIPTION FOR IMPLANTED CARDIAC DEVICE PROGRAMMING  Patient Information: Name:  Chad Brown  DOB:  1941/04/16  MRN:  [id number]    Planned Procedure:  transcatheter mitral edge to edge repair with transesophageal echocardiogram  Surgeon:  Dr. Lynnette Caffey and Dr. Excell Seltzer  Date of Procedure:  08/21/23  Cautery will be used.  Position during surgery:  supine   Please send documentation back to:  [facility] (Fax # [phone])  Device Information:  Clinic EP Physician:  Loman Brooklyn, MD   Device Type:  Defibrillator Manufacturer and Phone #:  [employer]tronic: [id number] Pacemaker Dependent?:  Unknown Date of Last Device Check:  07/30/23 Normal Device Function?:  Yes.    Electrophysiologist's Recommendations:  Have magnet available. Provide continuous ECG monitoring when magnet is used or reprogramming is to be performed.  Procedure will likely interfere with device function.  Device should be programmed:  Tachy therapies disabled  Per Device Clinic Standing Orders, Lenor Coffin, RN  11:40 AM 08/16/2023

## 2023-08-19 ENCOUNTER — Other Ambulatory Visit: Payer: Self-pay

## 2023-08-19 ENCOUNTER — Ambulatory Visit (HOSPITAL_COMMUNITY): Admission: RE | Admit: 2023-08-19 | Payer: Medicare PPO | Source: Ambulatory Visit

## 2023-08-19 ENCOUNTER — Encounter (HOSPITAL_COMMUNITY)
Admission: RE | Admit: 2023-08-19 | Discharge: 2023-08-19 | Disposition: A | Discharge status: Home / Self Care | Payer: Medicare PPO | Source: Ambulatory Visit | Attending: Internal Medicine | Admitting: Internal Medicine

## 2023-08-19 DIAGNOSIS — Z01818 Encounter for other preprocedural examination: Secondary | ICD-10-CM | POA: Insufficient documentation

## 2023-08-19 DIAGNOSIS — E890 Postprocedural hypothyroidism: Secondary | ICD-10-CM | POA: Diagnosis present

## 2023-08-19 DIAGNOSIS — L7632 Postprocedural hematoma of skin and subcutaneous tissue following other procedure: Secondary | ICD-10-CM | POA: Diagnosis not present

## 2023-08-19 DIAGNOSIS — K219 Gastro-esophageal reflux disease without esophagitis: Secondary | ICD-10-CM | POA: Diagnosis present

## 2023-08-19 DIAGNOSIS — F32A Depression, unspecified: Secondary | ICD-10-CM | POA: Diagnosis present

## 2023-08-19 DIAGNOSIS — I4891 Unspecified atrial fibrillation: Secondary | ICD-10-CM | POA: Diagnosis not present

## 2023-08-19 DIAGNOSIS — N1832 Chronic kidney disease, stage 3b: Secondary | ICD-10-CM | POA: Diagnosis present

## 2023-08-19 DIAGNOSIS — Z1152 Encounter for screening for COVID-19: Secondary | ICD-10-CM | POA: Insufficient documentation

## 2023-08-19 DIAGNOSIS — I252 Old myocardial infarction: Secondary | ICD-10-CM | POA: Diagnosis not present

## 2023-08-19 DIAGNOSIS — I428 Other cardiomyopathies: Secondary | ICD-10-CM | POA: Diagnosis present

## 2023-08-19 DIAGNOSIS — E785 Hyperlipidemia, unspecified: Secondary | ICD-10-CM | POA: Diagnosis present

## 2023-08-19 DIAGNOSIS — I5022 Chronic systolic (congestive) heart failure: Secondary | ICD-10-CM | POA: Diagnosis not present

## 2023-08-19 DIAGNOSIS — M159 Polyosteoarthritis, unspecified: Secondary | ICD-10-CM | POA: Diagnosis present

## 2023-08-19 DIAGNOSIS — I340 Nonrheumatic mitral (valve) insufficiency: Secondary | ICD-10-CM | POA: Diagnosis not present

## 2023-08-19 DIAGNOSIS — Z006 Encounter for examination for normal comparison and control in clinical research program: Secondary | ICD-10-CM | POA: Diagnosis not present

## 2023-08-19 DIAGNOSIS — I447 Left bundle-branch block, unspecified: Secondary | ICD-10-CM | POA: Diagnosis present

## 2023-08-19 DIAGNOSIS — I517 Cardiomegaly: Secondary | ICD-10-CM | POA: Diagnosis not present

## 2023-08-19 DIAGNOSIS — E871 Hypo-osmolality and hyponatremia: Secondary | ICD-10-CM | POA: Diagnosis present

## 2023-08-19 DIAGNOSIS — I5023 Acute on chronic systolic (congestive) heart failure: Secondary | ICD-10-CM | POA: Diagnosis present

## 2023-08-19 DIAGNOSIS — I272 Pulmonary hypertension, unspecified: Secondary | ICD-10-CM | POA: Diagnosis present

## 2023-08-19 DIAGNOSIS — I4819 Other persistent atrial fibrillation: Secondary | ICD-10-CM | POA: Diagnosis present

## 2023-08-19 DIAGNOSIS — D631 Anemia in chronic kidney disease: Secondary | ICD-10-CM | POA: Diagnosis present

## 2023-08-19 DIAGNOSIS — Y840 Cardiac catheterization as the cause of abnormal reaction of the patient, or of later complication, without mention of misadventure at the time of the procedure: Secondary | ICD-10-CM | POA: Diagnosis not present

## 2023-08-19 DIAGNOSIS — Z954 Presence of other heart-valve replacement: Secondary | ICD-10-CM | POA: Diagnosis not present

## 2023-08-19 DIAGNOSIS — S301XXA Contusion of abdominal wall, initial encounter: Secondary | ICD-10-CM | POA: Diagnosis present

## 2023-08-19 DIAGNOSIS — G4733 Obstructive sleep apnea (adult) (pediatric): Secondary | ICD-10-CM | POA: Diagnosis present

## 2023-08-19 DIAGNOSIS — I110 Hypertensive heart disease with heart failure: Secondary | ICD-10-CM | POA: Diagnosis not present

## 2023-08-19 DIAGNOSIS — I130 Hypertensive heart and chronic kidney disease with heart failure and stage 1 through stage 4 chronic kidney disease, or unspecified chronic kidney disease: Secondary | ICD-10-CM | POA: Diagnosis present

## 2023-08-19 DIAGNOSIS — H409 Unspecified glaucoma: Secondary | ICD-10-CM | POA: Diagnosis present

## 2023-08-19 DIAGNOSIS — E875 Hyperkalemia: Secondary | ICD-10-CM | POA: Diagnosis present

## 2023-08-19 DIAGNOSIS — I083 Combined rheumatic disorders of mitral, aortic and tricuspid valves: Secondary | ICD-10-CM | POA: Diagnosis not present

## 2023-08-19 DIAGNOSIS — I739 Peripheral vascular disease, unspecified: Secondary | ICD-10-CM | POA: Diagnosis present

## 2023-08-19 LAB — COMPREHENSIVE METABOLIC PANEL
ALT: 16 U/L (ref 0–44)
AST: 15 U/L (ref 15–41)
Albumin: 3.1 g/dL — ABNORMAL LOW (ref 3.5–5.0)
Alkaline Phosphatase: 63 U/L (ref 38–126)
Anion gap: 6 (ref 5–15)
BUN: 44 mg/dL — ABNORMAL HIGH (ref 8–23)
CO2: 27 mmol/L (ref 22–32)
Calcium: 8 mg/dL — ABNORMAL LOW (ref 8.9–10.3)
Chloride: 103 mmol/L (ref 98–111)
Creatinine, Ser: 2.79 mg/dL — ABNORMAL HIGH (ref 0.61–1.24)
GFR, Estimated: 22 mL/min — ABNORMAL LOW (ref >60)
Glucose, Bld: 141 mg/dL — ABNORMAL HIGH (ref 70–99)
Potassium: 3.8 mmol/L (ref 3.5–5.1)
Sodium: 136 mmol/L (ref 135–145)
Total Bilirubin: 0.5 mg/dL (ref <1.2)
Total Protein: 7.3 g/dL (ref 6.5–8.1)

## 2023-08-19 LAB — CBC
HCT: 38 % — ABNORMAL LOW (ref 39.0–52.0)
Hemoglobin: 11.9 g/dL — ABNORMAL LOW (ref 13.0–17.0)
MCH: 30.5 pg (ref 26.0–34.0)
MCHC: 31.3 g/dL (ref 30.0–36.0)
MCV: 97.4 fL (ref 80.0–100.0)
Platelets: 165 10*3/uL (ref 150–400)
RBC: 3.9 MIL/uL — ABNORMAL LOW (ref 4.22–5.81)
RDW: 14.6 % (ref 11.5–15.5)
WBC: 7.1 10*3/uL (ref 4.0–10.5)
nRBC: 0 % (ref 0.0–0.2)

## 2023-08-19 LAB — BRAIN NATRIURETIC PEPTIDE: B Natriuretic Peptide: 459.2 pg/mL — ABNORMAL HIGH (ref 0.0–100.0)

## 2023-08-19 LAB — TYPE AND SCREEN
ABO/RH(D): O POS
Antibody Screen: NEGATIVE

## 2023-08-19 LAB — SURGICAL PCR SCREEN
MRSA, PCR: NEGATIVE
Staphylococcus aureus: NEGATIVE

## 2023-08-19 LAB — URINALYSIS, ROUTINE W REFLEX MICROSCOPIC
Bilirubin Urine: NEGATIVE
Glucose, UA: 150 mg/dL — AB
Hgb urine dipstick: NEGATIVE
Ketones, ur: NEGATIVE mg/dL
Leukocytes,Ua: NEGATIVE
Nitrite: NEGATIVE
Protein, ur: NEGATIVE mg/dL
Specific Gravity, Urine: 1.014 (ref 1.005–1.030)
pH: 5 (ref 5.0–8.0)

## 2023-08-19 LAB — PROTIME-INR
INR: 1.3 — ABNORMAL HIGH (ref 0.8–1.2)
Prothrombin Time: 16.3 s — ABNORMAL HIGH (ref 11.4–15.2)

## 2023-08-19 NOTE — Progress Notes (Addendum)
Patient signed all consents at PAT lab appointment. CHG surgical prep reviewed with patient and all questions answered.  Patients chart send to anesthesia for review. Pt denies any respiratory illness/infection in the last two months.   Pt made aware of change in arrival/surgery start time at lab appointment. Updated surgical letter given and reviewed with pt and his wife. Informed that Gunnar Fusi, RN with Structural Heart team will reach out to patient sometime today to answer any questions regarding surgery arrival/surgery start time.

## 2023-08-19 NOTE — Addendum Note (Signed)
Addended by: Geralyn Flash D on: 08/19/2023 03:10 PM   Modules accepted: Level of Service

## 2023-08-19 NOTE — Telephone Encounter (Signed)
Dr. Lynnette Caffey personally spoke with the patient Friday night and it was decided together to proceed with MitraClip 08/21/2023. It was agreed the patient will continue taking Tylenol for pain and will if pain worsens.

## 2023-08-19 NOTE — Addendum Note (Signed)
Addended by: Gunnar Fusi A on: 08/19/2023 02:39 PM   Modules accepted: Orders

## 2023-08-19 NOTE — Progress Notes (Signed)
Remote ICD transmission.   

## 2023-08-20 ENCOUNTER — Encounter (HOSPITAL_COMMUNITY): Payer: Self-pay

## 2023-08-20 ENCOUNTER — Inpatient Hospital Stay (HOSPITAL_COMMUNITY)
Admission: AD | Admit: 2023-08-20 | Discharge: 2023-08-22 | DRG: 266 | Disposition: A | Discharge status: Home / Self Care | Payer: Medicare PPO | Source: Ambulatory Visit | Attending: Internal Medicine | Admitting: Internal Medicine

## 2023-08-20 ENCOUNTER — Telehealth: Payer: Self-pay

## 2023-08-20 DIAGNOSIS — I10 Essential (primary) hypertension: Secondary | ICD-10-CM | POA: Diagnosis present

## 2023-08-20 DIAGNOSIS — I428 Other cardiomyopathies: Secondary | ICD-10-CM | POA: Diagnosis present

## 2023-08-20 DIAGNOSIS — I4819 Other persistent atrial fibrillation: Secondary | ICD-10-CM | POA: Diagnosis present

## 2023-08-20 DIAGNOSIS — Z1152 Encounter for screening for COVID-19: Secondary | ICD-10-CM | POA: Diagnosis not present

## 2023-08-20 DIAGNOSIS — M159 Polyosteoarthritis, unspecified: Secondary | ICD-10-CM | POA: Diagnosis present

## 2023-08-20 DIAGNOSIS — E875 Hyperkalemia: Secondary | ICD-10-CM | POA: Diagnosis present

## 2023-08-20 DIAGNOSIS — Z841 Family history of disorders of kidney and ureter: Secondary | ICD-10-CM

## 2023-08-20 DIAGNOSIS — N1832 Chronic kidney disease, stage 3b: Secondary | ICD-10-CM | POA: Diagnosis present

## 2023-08-20 DIAGNOSIS — Z7901 Long term (current) use of anticoagulants: Secondary | ICD-10-CM

## 2023-08-20 DIAGNOSIS — I447 Left bundle-branch block, unspecified: Secondary | ICD-10-CM | POA: Diagnosis present

## 2023-08-20 DIAGNOSIS — Z85820 Personal history of malignant melanoma of skin: Secondary | ICD-10-CM

## 2023-08-20 DIAGNOSIS — I739 Peripheral vascular disease, unspecified: Secondary | ICD-10-CM | POA: Diagnosis present

## 2023-08-20 DIAGNOSIS — L7632 Postprocedural hematoma of skin and subcutaneous tissue following other procedure: Secondary | ICD-10-CM | POA: Diagnosis not present

## 2023-08-20 DIAGNOSIS — Z006 Encounter for examination for normal comparison and control in clinical research program: Secondary | ICD-10-CM

## 2023-08-20 DIAGNOSIS — F32A Depression, unspecified: Secondary | ICD-10-CM | POA: Diagnosis present

## 2023-08-20 DIAGNOSIS — Z8249 Family history of ischemic heart disease and other diseases of the circulatory system: Secondary | ICD-10-CM

## 2023-08-20 DIAGNOSIS — H5461 Unqualified visual loss, right eye, normal vision left eye: Secondary | ICD-10-CM | POA: Diagnosis present

## 2023-08-20 DIAGNOSIS — Z85840 Personal history of malignant neoplasm of eye: Secondary | ICD-10-CM

## 2023-08-20 DIAGNOSIS — H409 Unspecified glaucoma: Secondary | ICD-10-CM | POA: Diagnosis present

## 2023-08-20 DIAGNOSIS — E871 Hypo-osmolality and hyponatremia: Secondary | ICD-10-CM | POA: Diagnosis present

## 2023-08-20 DIAGNOSIS — I5023 Acute on chronic systolic (congestive) heart failure: Secondary | ICD-10-CM | POA: Diagnosis present

## 2023-08-20 DIAGNOSIS — Z9889 Other specified postprocedural states: Secondary | ICD-10-CM

## 2023-08-20 DIAGNOSIS — I517 Cardiomegaly: Secondary | ICD-10-CM | POA: Diagnosis not present

## 2023-08-20 DIAGNOSIS — Z95810 Presence of automatic (implantable) cardiac defibrillator: Secondary | ICD-10-CM

## 2023-08-20 DIAGNOSIS — Z954 Presence of other heart-valve replacement: Secondary | ICD-10-CM | POA: Diagnosis not present

## 2023-08-20 DIAGNOSIS — D631 Anemia in chronic kidney disease: Secondary | ICD-10-CM | POA: Diagnosis present

## 2023-08-20 DIAGNOSIS — E890 Postprocedural hypothyroidism: Secondary | ICD-10-CM | POA: Diagnosis present

## 2023-08-20 DIAGNOSIS — I5022 Chronic systolic (congestive) heart failure: Secondary | ICD-10-CM | POA: Diagnosis not present

## 2023-08-20 DIAGNOSIS — E785 Hyperlipidemia, unspecified: Secondary | ICD-10-CM | POA: Diagnosis present

## 2023-08-20 DIAGNOSIS — I130 Hypertensive heart and chronic kidney disease with heart failure and stage 1 through stage 4 chronic kidney disease, or unspecified chronic kidney disease: Secondary | ICD-10-CM | POA: Diagnosis present

## 2023-08-20 DIAGNOSIS — K219 Gastro-esophageal reflux disease without esophagitis: Secondary | ICD-10-CM | POA: Diagnosis present

## 2023-08-20 DIAGNOSIS — G4733 Obstructive sleep apnea (adult) (pediatric): Secondary | ICD-10-CM | POA: Diagnosis present

## 2023-08-20 DIAGNOSIS — Z888 Allergy status to other drugs, medicaments and biological substances status: Secondary | ICD-10-CM

## 2023-08-20 DIAGNOSIS — Z85850 Personal history of malignant neoplasm of thyroid: Secondary | ICD-10-CM

## 2023-08-20 DIAGNOSIS — I2510 Atherosclerotic heart disease of native coronary artery without angina pectoris: Secondary | ICD-10-CM | POA: Diagnosis present

## 2023-08-20 DIAGNOSIS — Z79899 Other long term (current) drug therapy: Secondary | ICD-10-CM

## 2023-08-20 DIAGNOSIS — Z881 Allergy status to other antibiotic agents status: Secondary | ICD-10-CM

## 2023-08-20 DIAGNOSIS — Z8349 Family history of other endocrine, nutritional and metabolic diseases: Secondary | ICD-10-CM

## 2023-08-20 DIAGNOSIS — I272 Pulmonary hypertension, unspecified: Secondary | ICD-10-CM | POA: Diagnosis present

## 2023-08-20 DIAGNOSIS — Z96641 Presence of right artificial hip joint: Secondary | ICD-10-CM | POA: Diagnosis present

## 2023-08-20 DIAGNOSIS — Y840 Cardiac catheterization as the cause of abnormal reaction of the patient, or of later complication, without mention of misadventure at the time of the procedure: Secondary | ICD-10-CM | POA: Diagnosis not present

## 2023-08-20 DIAGNOSIS — I083 Combined rheumatic disorders of mitral, aortic and tricuspid valves: Secondary | ICD-10-CM | POA: Diagnosis not present

## 2023-08-20 DIAGNOSIS — I4891 Unspecified atrial fibrillation: Secondary | ICD-10-CM | POA: Diagnosis not present

## 2023-08-20 DIAGNOSIS — I110 Hypertensive heart disease with heart failure: Secondary | ICD-10-CM | POA: Diagnosis not present

## 2023-08-20 DIAGNOSIS — N401 Enlarged prostate with lower urinary tract symptoms: Secondary | ICD-10-CM | POA: Diagnosis present

## 2023-08-20 DIAGNOSIS — Z87891 Personal history of nicotine dependence: Secondary | ICD-10-CM

## 2023-08-20 DIAGNOSIS — Z860101 Personal history of adenomatous and serrated colon polyps: Secondary | ICD-10-CM

## 2023-08-20 DIAGNOSIS — I340 Nonrheumatic mitral (valve) insufficiency: Principal | ICD-10-CM | POA: Diagnosis present

## 2023-08-20 DIAGNOSIS — R3911 Hesitancy of micturition: Secondary | ICD-10-CM | POA: Diagnosis present

## 2023-08-20 DIAGNOSIS — S301XXA Contusion of abdominal wall, initial encounter: Secondary | ICD-10-CM | POA: Diagnosis present

## 2023-08-20 DIAGNOSIS — Z7984 Long term (current) use of oral hypoglycemic drugs: Secondary | ICD-10-CM

## 2023-08-20 DIAGNOSIS — I252 Old myocardial infarction: Secondary | ICD-10-CM

## 2023-08-20 DIAGNOSIS — Z79890 Hormone replacement therapy (postmenopausal): Secondary | ICD-10-CM

## 2023-08-20 LAB — SARS CORONAVIRUS 2 (TAT 6-24 HRS): SARS Coronavirus 2: NEGATIVE

## 2023-08-20 LAB — MAGNESIUM: Magnesium: 2.3 mg/dL (ref 1.7–2.4)

## 2023-08-20 LAB — BRAIN NATRIURETIC PEPTIDE: B Natriuretic Peptide: 478.1 pg/mL — ABNORMAL HIGH (ref 0.0–100.0)

## 2023-08-20 LAB — SURGICAL PCR SCREEN
MRSA, PCR: NEGATIVE
Staphylococcus aureus: NEGATIVE

## 2023-08-20 MED ORDER — AMIODARONE LOAD VIA INFUSION
150.0000 mg | Freq: Once | INTRAVENOUS | Status: AC
  Administered 2023-08-20: 150 mg via INTRAVENOUS
  Filled 2023-08-20: qty 83.34

## 2023-08-20 MED ORDER — SODIUM CHLORIDE 0.9 % IV SOLN
INTRAVENOUS | Status: DC
Start: 2023-08-20 — End: 2023-08-21

## 2023-08-20 MED ORDER — CHLORHEXIDINE GLUCONATE 4 % EX SOLN
60.0000 mL | Freq: Once | CUTANEOUS | Status: AC
  Administered 2023-08-20: 4 via TOPICAL
  Filled 2023-08-20: qty 60

## 2023-08-20 MED ORDER — AMIODARONE HCL IN DEXTROSE 360-4.14 MG/200ML-% IV SOLN
30.0000 mg/h | INTRAVENOUS | Status: DC
  Administered 2023-08-21 – 2023-08-22 (×2): 30 mg/h via INTRAVENOUS
  Filled 2023-08-20 (×3): qty 200

## 2023-08-20 MED ORDER — CHLORHEXIDINE GLUCONATE 4 % EX SOLN
60.0000 mL | Freq: Once | CUTANEOUS | Status: AC
  Administered 2023-08-21: 4 via TOPICAL
  Filled 2023-08-20: qty 60

## 2023-08-20 MED ORDER — TAMSULOSIN HCL 0.4 MG PO CAPS
0.4000 mg | ORAL_CAPSULE | Freq: Every day | ORAL | Status: DC
  Administered 2023-08-20 – 2023-08-21 (×2): 0.4 mg via ORAL
  Filled 2023-08-20 (×2): qty 1

## 2023-08-20 MED ORDER — CHLORHEXIDINE GLUCONATE 0.12 % MT SOLN
15.0000 mL | Freq: Once | OROMUCOSAL | Status: AC
Start: 2023-08-21 — End: 2023-08-21
  Administered 2023-08-21: 15 mL via OROMUCOSAL
  Filled 2023-08-20: qty 15

## 2023-08-20 MED ORDER — POLYETHYL GLYCOL-PROPYL GLYCOL 0.4-0.3 % OP SOLN: 1.0000 [drp] | Freq: Every day | OPHTHALMIC | Status: DC

## 2023-08-20 MED ORDER — CEFAZOLIN SODIUM-DEXTROSE 2-4 GM/100ML-% IV SOLN
2.0000 g | INTRAVENOUS | Status: AC
Start: 2023-08-21 — End: 2023-08-22
  Administered 2023-08-21: 2 g via INTRAVENOUS

## 2023-08-20 MED ORDER — ACETAMINOPHEN 325 MG PO TABS: 650.0000 mg | ORAL_TABLET | ORAL | Status: DC | PRN

## 2023-08-20 MED ORDER — AMIODARONE HCL 200 MG PO TABS: 200.0000 mg | ORAL_TABLET | Freq: Every day | ORAL | Status: DC

## 2023-08-20 MED ORDER — ONDANSETRON HCL 4 MG/2ML IJ SOLN: 4.0000 mg | Freq: Four times a day (QID) | INTRAMUSCULAR | Status: DC | PRN

## 2023-08-20 MED ORDER — FINASTERIDE 5 MG PO TABS
5.0000 mg | ORAL_TABLET | Freq: Every day | ORAL | Status: DC
  Administered 2023-08-21 – 2023-08-22 (×2): 5 mg via ORAL
  Filled 2023-08-20 (×2): qty 1

## 2023-08-20 MED ORDER — AMIODARONE HCL IN DEXTROSE 360-4.14 MG/200ML-% IV SOLN
60.0000 mg/h | INTRAVENOUS | Status: AC
  Administered 2023-08-20 (×2): 60 mg/h via INTRAVENOUS
  Filled 2023-08-20: qty 200

## 2023-08-20 MED ORDER — GABAPENTIN 100 MG PO CAPS
200.0000 mg | ORAL_CAPSULE | Freq: Two times a day (BID) | ORAL | Status: DC
  Administered 2023-08-20 – 2023-08-22 (×3): 200 mg via ORAL
  Filled 2023-08-20 (×3): qty 2

## 2023-08-20 MED ORDER — LEVOTHYROXINE SODIUM 137 MCG PO TABS
137.0000 ug | ORAL_TABLET | Freq: Every day | ORAL | Status: DC
  Administered 2023-08-22: 137 ug via ORAL
  Filled 2023-08-20 (×2): qty 1

## 2023-08-20 MED ORDER — LATANOPROST 0.005 % OP SOLN
1.0000 [drp] | Freq: Every day | OPHTHALMIC | Status: DC
  Administered 2023-08-21: 1 [drp] via OPHTHALMIC
  Filled 2023-08-20: qty 2.5

## 2023-08-20 MED ORDER — NETARSUDIL DIMESYLATE 0.02 % OP SOLN
1.0000 [drp] | Freq: Every day | OPHTHALMIC | Status: DC
  Administered 2023-08-21: 1 [drp] via OPHTHALMIC
  Filled 2023-08-20 (×3): qty 1

## 2023-08-20 MED ORDER — DORZOLAMIDE HCL-TIMOLOL MAL 2-0.5 % OP SOLN
1.0000 [drp] | Freq: Two times a day (BID) | OPHTHALMIC | Status: DC
  Administered 2023-08-20 – 2023-08-22 (×3): 1 [drp] via OPHTHALMIC
  Filled 2023-08-20: qty 10

## 2023-08-20 MED ORDER — CHLORHEXIDINE GLUCONATE 4 % EX SOLN: 30.0000 mL | CUTANEOUS | Status: DC

## 2023-08-20 MED ORDER — ESCITALOPRAM OXALATE 10 MG PO TABS
20.0000 mg | ORAL_TABLET | Freq: Every day | ORAL | Status: DC
  Administered 2023-08-21 – 2023-08-22 (×2): 20 mg via ORAL
  Filled 2023-08-20 (×2): qty 2

## 2023-08-20 MED ORDER — POLYVINYL ALCOHOL 1.4 % OP SOLN: 2.0000 [drp] | OPHTHALMIC | Status: DC | PRN

## 2023-08-20 NOTE — Progress Notes (Signed)
No ICM remote transmission received for 08/20/2023 and next ICM transmission scheduled for 08/26/2023.

## 2023-08-20 NOTE — Progress Notes (Addendum)
Patient currently admitted for MitraClip with plans for procedure tomorrow.  Overall patient stable without any complaints.  Volume status looks good.  On telemetry he he is having a fair amount of PVCs, again asx.  I had asked Dr. Wyline Mood to review and suspects he is in atrial fibrillation with intermittent native QRS beats along with paced QRS.  We will start IV amiodarone.  I will check BMP and magnesium to ensure electrolytes are within normal limits      Addendum: mag is 2.3.

## 2023-08-20 NOTE — Telephone Encounter (Signed)
The patient reports he is having some shortness of breath, especially on exertion. He still has some pain from his fall last week as well.  Discussed with Dr. Lynnette Caffey. Will admit the patient tonight for observation prior to procedure tomorrow. The patient understands he will be called to come to Admitting when bed is available. The patient was grateful for call and agreed with plan.

## 2023-08-20 NOTE — Progress Notes (Signed)
Asked to evaluate patient for intermittent wide complex rhythm on telemetry. On review patient looks to be in afib with intermittent native wide QRS and intermittent paced QRS. I do not see NSVT. From 10/16 he was fully AV paced, from yesterdays EKGs looks to be afib with intermittent pacing. Patient with BiV pacemaker in setting of LV dysfunction. WIll d/c his oral amio and start IV amio to see if can restore sinus rhythm and more consistent BiV pacing.   Dina Rich MD

## 2023-08-20 NOTE — Anesthesia Preprocedure Evaluation (Signed)
Anesthesia Evaluation  Patient identified by MRN, date of birth, ID band Patient awake    Reviewed: Allergy & Precautions, H&P , NPO status , Patient's Chart, lab work & pertinent test results  Airway Mallampati: II  TM Distance: >3 FB Neck ROM: Full    Dental no notable dental hx. (+) Edentulous Upper, Edentulous Lower, Dental Advisory Given   Pulmonary neg pulmonary ROS, sleep apnea , former smoker   Pulmonary exam normal breath sounds clear to auscultation       Cardiovascular Exercise Tolerance: Good hypertension, + CAD, + Past MI, + Peripheral Vascular Disease and +CHF  + dysrhythmias Atrial Fibrillation + Cardiac Defibrillator + Valvular Problems/Murmurs MR  Rhythm:Regular Rate:Normal     Neuro/Psych    Depression    negative neurological ROS     GI/Hepatic Neg liver ROS,GERD  Medicated,,  Endo/Other  Hypothyroidism    Renal/GU negative Renal ROS  negative genitourinary   Musculoskeletal  (+) Arthritis , Osteoarthritis,    Abdominal   Peds  Hematology  (+) Blood dyscrasia, anemia   Anesthesia Other Findings   Reproductive/Obstetrics negative OB ROS                             Anesthesia Physical Anesthesia Plan  ASA: 4  Anesthesia Plan: General   Post-op Pain Management: Tylenol PO (pre-op)*   Induction: Intravenous  PONV Risk Score and Plan: 3 and Ondansetron and Dexamethasone  Airway Management Planned: Oral ETT  Additional Equipment: Arterial line  Intra-op Plan:   Post-operative Plan: Extubation in OR  Informed Consent: I have reviewed the patients History and Physical, chart, labs and discussed the procedure including the risks, benefits and alternatives for the proposed anesthesia with the patient or authorized representative who has indicated his/her understanding and acceptance.     Dental advisory given  Plan Discussed with: CRNA  Anesthesia Plan  Comments:        Anesthesia Quick Evaluation

## 2023-08-20 NOTE — H&P (Addendum)
History & Physical    Patient ID: ANDWELE SIDDONS MRN: [id number], DOB/AGE: 11-15-1940   Admit date: 08/20/2023  Primary Physician: Cleatis Polka., MD Primary Cardiologist: Dr. Shirlee Latch, MD/ Dr. Lynnette Caffey, MD (mTEER)  Patient Profile   H&P adapted from Dr. Lynnette Caffey office note from 08/14/2023:  Mr. Hubbs is an 82yo M with a hx of PAD, OSA on CPAP, chronic HFrEF, NICM (EF down to 15% at one point), LBBB, s/p MDT CRT-D (2016), PAF s/p multiple cardioversions and ablation (2021) on amiodarone/Xarelto, CKD stage IIIb, HTN, pulm HTN, thyroid carcinoma s/p thyroidectomy (2022), pulmonary nodules, anemia, and severe MR with NYHA II symptoms who presents for direct admission in anticipation of mTEER 08/21/23 due to worsening SOB and pain from fall last week.   Past Medical History   Past Medical History:  Diagnosis Date   Adenomatous colon polyp    tubular   AICD (automatic cardioverter/defibrillator) present 03/08/2015   MDT CRTD dual pacemaker and defib   Anemia    iron deficient   Arthritis    "about all my joints; hands, knees, back" (03/08/2015)   Atherosclerosis    Cataract    left eye small   Cholelithiasis    gallstones   Chronic systolic CHF (congestive heart failure) (HCC)    a. New dx 12/2012 ? NICM, may be r/t afib. b. Nuc 03/2013 - normal;  c. 03/2015 TEE EF 15-20%.   Colon polyp, hyperplastic 01/2015   removed precancerous lesions   Depression    Diverticulosis    Dysrhythmia    afib   GERD (gastroesophageal reflux disease)    Glaucoma    right eye   Hyperlipidemia    Hypertension    Melanoma of eye (HCC) 2000's   "right; it's never been biopsied"   Melanoma of lower leg (HCC) 2015   "left; right at my knee"   Myocardial infarction (HCC) 1998   OSA (obstructive sleep apnea) 01/04/2016   no longer tolerates cpap   Peripheral vision loss, right 2006   Persistent atrial fibrillation (HCC)    a. Dx 12/2012, s/p TEE/DCCV 01/26/13. b. On Xarelto (CHA2DS2VASc =  3);  c. 03/2015 TEE (EF 15-20%, no LAA thrombus) and DCCV - amio increased to 200 mg bid.   Pneumonia    Urinary hesitancy due to benign prostatic hypertrophy     Past Surgical History:  Procedure Laterality Date   ATRIAL FIBRILLATION ABLATION N/A 11/03/2019   Procedure: ATRIAL FIBRILLATION ABLATION;  Surgeon: Hillis Range, MD;  Location: [facility] INVASIVE CV LAB;  Service: Cardiovascular;  Laterality: N/A;   BACK SURGERY     upper back, cannot turn neck well   CARDIAC CATHETERIZATION  1998   CARDIOVERSION N/A 01/26/2013   Procedure: CARDIOVERSION;  Surgeon: Lewayne Bunting, MD;  Location: [facility] ENDOSCOPY;  Service: Cardiovascular;  Laterality: N/A;   CARDIOVERSION N/A 03/23/2015   Procedure: CARDIOVERSION;  Surgeon: Jake Bathe, MD;  Location: [facility] ENDOSCOPY;  Service: Cardiovascular;  Laterality: N/A;   CARDIOVERSION N/A 08/14/2017   Procedure: CARDIOVERSION;  Surgeon: Wendall Stade, MD;  Location: [facility] ENDOSCOPY;  Service: Cardiovascular;  Laterality: N/A;   CATARACT EXTRACTION Right ~ 2006   COLONOSCOPY WITH PROPOFOL N/A 02/10/2015   Procedure: COLONOSCOPY WITH PROPOFOL;  Surgeon: Iva Boop, MD;  Location: [facility] ENDOSCOPY;  Service: Endoscopy;  Laterality: N/A;   COLONOSCOPY WITH PROPOFOL N/A 08/07/2016   Procedure: COLONOSCOPY WITH PROPOFOL;  Surgeon: Iva Boop, MD;  Location: [facility] ENDOSCOPY;  Service: Endoscopy;  Laterality: N/A;   ENTEROSCOPY N/A 08/17/2015   Procedure: ENTEROSCOPY;  Surgeon: Iva Boop, MD;  Location: [facility] ENDOSCOPY;  Service: Endoscopy;  Laterality: N/A;   EP IMPLANTABLE DEVICE N/A 03/08/2015   MDT Ovidio Kin CRT-D for nonischemic CM by Dr Johney Frame for primary prevention   GLAUCOMA SURGERY Right ~ 2006   "put 3 stents in to drain fluid" (03/08/2015) not successful, sent to [facility] to try to get last stent out   HOT HEMOSTASIS N/A 08/07/2016   Procedure: HOT HEMOSTASIS (ARGON PLASMA COAGULATION/BICAP);  Surgeon: Iva Boop, MD;  Location: [facility] ENDOSCOPY;  Service: Endoscopy;   Laterality: N/A;   INCISION AND DRAINAGE ABSCESS POSTERIOR CERVICALSPINE  05/2012   JOINT REPLACEMENT     MELANOMA EXCISION Left 2015   "lower leg; right at my knee"   REFRACTIVE SURGERY Right ~ 2006 X 2   "twice; both done at [facility]" (03/08/2015   RIGHT HEART CATH N/A 08/05/2023   Procedure: RIGHT HEART CATH;  Surgeon: Laurey Morale, MD;  Location: [facility] INVASIVE CV LAB;  Service: Cardiovascular;  Laterality: N/A;   SURGERY SCROTAL / TESTICULAR Right 1990's   TEE WITHOUT CARDIOVERSION N/A 01/26/2013   Procedure: TRANSESOPHAGEAL ECHOCARDIOGRAM (TEE);  Surgeon: Lewayne Bunting, MD;  Location: [facility] ENDOSCOPY;  Service: Cardiovascular;  Laterality: N/A;  [name] anes. /    TEE WITHOUT CARDIOVERSION N/A 10/05/2014   Procedure: TRANSESOPHAGEAL ECHOCARDIOGRAM (TEE)  with cardioversion;  Surgeon: Vesta Mixer, MD;  Location: [facility] ENDOSCOPY;  Service: Cardiovascular;  Laterality: N/A;  12:52 synched cardioversion at 120 joules,...afib to SR...12 lead EKG ordered.Marland KitchenMarland KitchenCardiozem d/c'ed per MD verbal order at SR   TEE WITHOUT CARDIOVERSION N/A 03/23/2015   Procedure: TRANSESOPHAGEAL ECHOCARDIOGRAM (TEE);  Surgeon: Jake Bathe, MD;  Location: [facility] ENDOSCOPY;  Service: Cardiovascular;  Laterality: N/A;   TEE WITHOUT CARDIOVERSION N/A 11/02/2019   Procedure: TRANSESOPHAGEAL ECHOCARDIOGRAM (TEE);  Surgeon: Wendall Stade, MD;  Location: [facility] ENDOSCOPY;  Service: Cardiovascular;  Laterality: N/A;   TEE WITHOUT CARDIOVERSION N/A 08/05/2023   Procedure: TRANSESOPHAGEAL ECHOCARDIOGRAM;  Surgeon: Laurey Morale, MD;  Location: [facility] INVASIVE CV LAB;  Service: Cardiovascular;  Laterality: N/A;   THORACIC SPINE SURGERY  03/2000   "ground calcium deposits from upper thoracic" (01/26/2013)   THYROIDECTOMY N/A 05/11/2021   Procedure: TOTAL THYROIDECTOMY;  Surgeon: Darnell Level, MD;  Location: [facility] ORS;  Service: General;  Laterality: N/A;   TOTAL HIP ARTHROPLASTY Right 06/2007   TRANSESOPHAGEAL ECHOCARDIOGRAM (CATH LAB) N/A 08/09/2023    Procedure: TRANSESOPHAGEAL ECHOCARDIOGRAM;  Surgeon: Laurey Morale, MD;  Location: [facility] INVASIVE CV LAB;  Service: Cardiovascular;  Laterality: N/A;     Allergies  Allergies  Allergen Reactions   Pravastatin Sodium Other (See Comments)    Joint and muscle pain   Azithromycin Diarrhea    History of Present Illness    Mr. Daykin cardiac history dates back to 1998 when he was admitted with chest pain and had cardiac cath showing nonobstructive CAD. He was diagnosed with new onset afib in 2014. Echo showed EF 30-35%. He had DCCV back to NSR. He was back in atrial fibrillation in 10/2014, and EF was low again on TEE at that time. Again, he had DCCV.  In 01/2015, cardiac MRI showed persistently low EF. With LBBB and reduced EF, he underwent [employer] CRT-D placement by Dr. Johney Frame.  He was back in atrial fibrillation in 03/2015 and had TEE-guided DCCV again with EF down to 15-20% on TEE. Repeat echo in 11/2015 showed EF 55-60%. He  underwent atrial fibrillation ablation in 11/2019. Echo in 11/2019 showed EF 40-45%, PASP 50 mmHg, mild MR.    He was admitted in 10/2022 after a syncopal episode at the gym. Device interrogation showed no arrhythmia. He was found to have orthostatic hypotension. Coreg and Entresto were stopped and Lasix was decreased. Spironolactone was later discontinued due to hyperkalemia. He recently had more symptomatic atrial fibrillation in 12/2022 and started on amiodarone with improvement.    More recently, he underwent repeat echocardiogram which showed EF 40-45%, mild LV dilation, mildly decreased RV systolic function, moderate-severe MR with splay artifact and PISA ERO 0.44 cm^2, PASP 71 mmHg, dilated IVC.  At that time he was reporting progressive shortness of breath and weight gain with NYHA class III sx. He was treated with IV diuretics. TEE and RHC 08/05/23 showed EF 45-50% and severe functional MR with 3 adjacent jets, primarily originating from restricted P1/P2 portion of the  mitral valve: ERO 0.55 cm^2 from largest jet. RHC with low CO and significantly elevated right and left heart filling pressures and he was admitted for volume optimization with Lasix gtt + milrinone.   Structural heart was consulted with plan to follow outpatient and schedule mTEER accordingly. In follow up with Dr. Lynnette Caffey, he was doing well therefore he was scheduled for 08/21/23.   In pre-procedure call today, patient was noted to have increased SOB concerning for volume overload due to MR. Case discussed with Dr. Lynnette Caffey and plan was for direct admission this evening for procedure tomorrow morning.    Home Medications    Prior to Admission medications   Medication Sig Start Date End Date Taking? Authorizing Provider  amiodarone (PACERONE) 200 MG tablet Take 1 tablet (200 mg total) by mouth daily. 02/20/23   Eustace Pen, PA-C  cholecalciferol (VITAMIN D3) 25 MCG (1000 UNIT) tablet Take 1,000 Units by mouth in the morning.    [provider]  Cyanocobalamin (VITAMIN B 12 PO) Take 500 mcg by mouth in the morning.    [provider]  dorzolamide-timolol (COSOPT) 22.3-6.8 MG/ML ophthalmic solution Place 1 drop into the right eye 2 (two) times daily.    [provider]  empagliflozin (JARDIANCE) 10 MG TABS tablet Take 1 tablet (10 mg total) by mouth daily. 10/18/22   Laurey Morale, MD  escitalopram (LEXAPRO) 20 MG tablet Take 1 tablet (20 mg total) by mouth daily. Patient taking differently: Take 20 mg by mouth daily in the afternoon. 06/23/19   Bensimhon, Bevelyn Buckles, MD  Evolocumab (REPATHA SURECLICK) 140 MG/ML SOAJ INJECT 140MG  ( ) INTO SKIN EVERY 2 WEEKS 11/28/22   Laurey Morale, MD  Ferrous Sulfate (IRON PO) Take 9 mg by mouth in the morning. Chewable    [provider]  finasteride (PROSCAR) 5 MG tablet Take 1 tablet (5 mg total) by mouth daily. Patient taking differently: Take 5 mg by mouth every evening. 06/22/19   Bensimhon, Bevelyn Buckles, MD  FOLIC  ACID PO Take 1 tablet by mouth daily.    [provider]  gabapentin (NEURONTIN) 100 MG capsule Take 200 mg by mouth 2 (two) times daily.     [provider]  latanoprost (XALATAN) 0.005 % ophthalmic solution Place 1 drop into the right eye daily at 12 noon.    [provider]  levothyroxine (SYNTHROID) 137 MCG tablet TAKE ONE TABLET DAILY BEFORE BREAKFAST. must make appointment FOR refills 07/11/23   Roma Kayser, MD  Polyethyl Glycol-Propyl Glycol 0.4-0.3 % SOLN Place 1  drop into the left eye in the morning and at bedtime.    [provider]  RHOPRESSA 0.02 % SOLN Place 1 drop into the right eye at bedtime. 10/11/21   [provider]  tamsulosin (FLOMAX) 0.4 MG CAPS Take 0.4 mg by mouth at bedtime.     [provider]  torsemide (DEMADEX) 20 MG tablet Take 3 tablets (60 mg total) by mouth daily. 08/10/23   Laurey Morale, MD  XARELTO 15 MG TABS tablet TAKE (1) TABLET DAILY WITH SUPPER. 02/28/23   Laurey Morale, MD    Family History    Family History  Problem Relation Age of Onset   Kidney disease Mother    Heart disease Mother        MI, open heart   Diabetes Mother        dialysis   Leukemia Father    Colon cancer Paternal Uncle    Lung cancer Paternal Uncle        x 2   Prostate cancer Paternal Uncle    Diabetes Maternal Grandmother    Heart attack Maternal Uncle    Diabetes Maternal Aunt        x 3   Diabetes Maternal Uncle    Thyroid disease Sister    Social History    Social History   Socioeconomic History   Marital status: Married    Spouse name: Not on file   Number of children: 3   Years of education: Not on file   Highest education level: Not on file  Occupational History   Occupation: retired  Tobacco Use   Smoking status: Former    Current packs/day: 0.00    Average packs/day: 3.0 packs/day for 48.0 years (144.0 ttl pk-yrs)    Types: Cigarettes    Start date: 09/28/1952    Quit date:  09/28/2000    Years since quitting: 22.9   Smokeless tobacco: Never  Vaping Use   Vaping status: Never Used  Substance and Sexual Activity   Alcohol use: No   Drug use: No   Sexual activity: Not Currently  Other Topics Concern   Not on file  Social History Narrative   Pt lives in [place] with spouse.  3 children are grown and healthy.   Retired.  Ran a country store for 30 years, previously worked in the [employer] for 16 years   Social Determinants of Corporate investment banker Strain: Not on [employer] Insecurity: No Food Insecurity (08/05/2023)   Hunger Vital Sign    Worried About Running Out of Food in the Last Year: Never true    Ran Out of Food in the Last Year: Never true  Transportation Needs: No Transportation Needs (08/05/2023)   PRAPARE - Administrator, Civil Service (Medical): No    Lack of Transportation (Non-Medical): No  Physical Activity: Inactive (11/03/2020)   Exercise Vital Sign    Days of Exercise per Week: 0 days    Minutes of Exercise per Session: 0 min  Stress: Not on file  Social Connections: Not on file  Intimate Partner Violence: Not At Risk (08/05/2023)   Humiliation, Afraid, Rape, and Kick questionnaire    Fear of Current or Ex-Partner: No    Emotionally Abused: No    Physically Abused: No    Sexually Abused: No    Physical Exam    There were no vitals taken for this visit.   Adapted from office note 08/14/2023  GEN:  AO x 3 in no acute distress HEENT: normal Dentition: Dentures Neck: JVP normal. +2 carotid upstrokes without bruits. No thyromegaly. Lungs: equal expansion, clear bilaterally CV: Apex is discrete and nondisplaced, irregular RRR with very soft systolic murmur at apex Abd: soft, non-tender, non-distended; no bruit; positive bowel sounds Ext: no edema, ecchymoses, or cyanosis Vascular: 2+ femoral pulses, 2+ radial pulses       Skin: warm and dry without rash Neuro: CN II-XII grossly intact; motor and  sensory grossly intact  Labs    Troponin (Point of Care Test) No results for input(s): "TROPIPOC" in the last 72 hours. No results for input(s): "CKTOTAL", "CKMB", "TROPONINI" in the last 72 hours. Lab Results  Component Value Date   WBC 7.1 08/19/2023   HGB 11.9 (L) 08/19/2023   HCT 38.0 (L) 08/19/2023   MCV 97.4 08/19/2023   PLT 165 08/19/2023    Recent Labs  Lab 08/19/23 0841  NA 136  K 3.8  CL 103  CO2 27  BUN 44*  CREATININE 2.79*  CALCIUM 8.0*  PROT 7.3  BILITOT 0.5  ALKPHOS 63  ALT 16  AST 15  GLUCOSE 141*   Lab Results  Component Value Date   CHOL 103 07/17/2023   HDL 28 (L) 07/17/2023   LDLCALC 44 07/17/2023   TRIG 155 (H) 07/17/2023   No results found for: "DDIMER"   Radiology Studies    DG Chest 2 View  Result Date: 08/16/2023 CLINICAL DATA:  Chest pain after fall. EXAM: CHEST - 2 VIEW COMPARISON:  August 06, 2023. FINDINGS: Stable cardiomediastinal silhouette. Left-sided fibrillator is unchanged. Left lung is clear. Mildly improved right lung opacities are noted suggesting improving pneumonia. Bony thorax is unremarkable. IMPRESSION: Mildly decreased right lung opacities are noted suggesting improving pneumonia. Continued follow-up is recommended to ensure resolution and rule out underlying neoplasm. Electronically Signed   By: Lupita Raider M.D.   On: 08/16/2023 16:09   ECHO TEE  Result Date: 08/09/2023    TRANSESOPHOGEAL ECHO REPORT   Patient Name:   SRINATH BLACKMER Date of Exam: 08/09/2023 Medical Rec #:  [id number]        Height:       66.0 in Accession #:    [id number]       Weight:       206.8 lb Date of Birth:  January 26, 1941         BSA:          2.028 m Patient Age:    82 years         BP:           108/65 mmHg Patient Gender: M                HR:           60 bpm. Exam Location:  Inpatient Procedure: 3D Echo, Transesophageal Echo, Cardiac Doppler and Color Doppler Indications:     Mitral Regurgitation  History:         Patient has prior history of  Echocardiogram examinations, most                  recent 08/07/2023. CHF and Cardiomyopathy, Defibrillator;                  Arrythmias:Atrial Fibrillation.  Sonographer:     Lucendia Herrlich RCS Sonographer#2:   Milbert Coulter RDCS Referring Phys:  [id number] Roxy Horseman SIMMONS Diagnosing Phys: Wilfred Lacy PROCEDURE: After discussion of the  risks and benefits of a TEE, an informed consent was obtained from the patient. The transesophogeal probe was passed without difficulty through the esophogus of the patient. Imaged were obtained with the patient in a left lateral decubitus position. Sedation performed by different physician. The patient was monitored while under deep sedation. Anesthestetic sedation was provided intravenously by Anesthesiology: 304mg  of Propofol, 20mg  of Lidocaine. Image quality was good. The patient developed no complications during the procedure.  IMPRESSIONS  1. Left ventricular ejection fraction, by estimation, is 45 to 50%. The left ventricle has mildly decreased function. The left ventricle demonstrates global hypokinesis. There is mild concentric left ventricular hypertrophy.  2. Peak RV-RA gradient 43 mmHg. Right ventricular systolic function is mildly reduced. The right ventricular size is normal.  3. Left atrial size was moderately dilated. No left atrial/left atrial appendage thrombus was detected.  4. Right atrial size was mildly dilated.  5. No PFO or ASD by color doppler.  6. There is restriction of the posterior mitral leaflet predominantly in the P1/P2 region with several (looks like 3 predominant) jets of mitral regurgitation. PISA interrogation of the largest jet gives ERO 0.42 cm^2. 3-D vena contracta area 0.78 cm^2 incorporating all jets. There appeared to be slight systolic flow reversal in the pulmonary vein PW doppler signal. Severe mitral valve regurgitation, possibly atrial functional MR. No evidence of mitral stenosis. The mean mitral valve gradient is 1.0 mmHg.  7. The  tricuspid valve is abnormal. Tricuspid valve regurgitation is moderate.  8. The aortic valve is tricuspid. Aortic valve regurgitation is trivial. No aortic stenosis is present. FINDINGS  Left Ventricle: Left ventricular ejection fraction, by estimation, is 45 to 50%. The left ventricle has mildly decreased function. The left ventricle demonstrates global hypokinesis. The left ventricular internal cavity size was normal in size. There is  mild concentric left ventricular hypertrophy. Right Ventricle: Peak RV-RA gradient 43 mmHg. The right ventricular size is normal. No increase in right ventricular wall thickness. Right ventricular systolic function is mildly reduced. Left Atrium: Left atrial size was moderately dilated. No left atrial/left atrial appendage thrombus was detected. Right Atrium: Right atrial size was mildly dilated. Pericardium: There is no evidence of pericardial effusion. Mitral Valve: There is restriction of the posterior mitral leaflet predominantly in the P1/P2 region with several (looks like 3 predominant) jets of mitral regurgitation. PISA interrogation of the largest jet gives ERO 0.42 cm^2. 3-D vena contracta area 0.78 cm^2 incorporating all jets. There appeared to be slight systolic flow reversal in the pulmonary vein PW doppler signal. The mitral valve is abnormal. Severe mitral valve regurgitation. No evidence of mitral valve stenosis. MV peak gradient, 3.0 mmHg. The mean mitral valve gradient is 1.0 mmHg. Tricuspid Valve: The tricuspid valve is abnormal. Tricuspid valve regurgitation is moderate. Aortic Valve: The aortic valve is tricuspid. Aortic valve regurgitation is trivial. No aortic stenosis is present. Pulmonic Valve: The pulmonic valve was normal in structure. Pulmonic valve regurgitation is not visualized. Aorta: The aortic root is normal in size and structure. IAS/Shunts: No PFO or ASD by color doppler. Additional Comments: A device lead is visualized in the right ventricle.  Spectral Doppler performed. MITRAL VALVE                  TRICUSPID VALVE MV Peak grad: 3.0 mmHg        TR Peak grad:   43.3 mmHg MV Mean grad: 1.0 mmHg        TR Vmax:  329.00 cm/s MV Vmax:      0.86 m/s MV Vmean:     42.0 cm/s MR Peak grad:    83.5 mmHg MR Mean grad:    55.0 mmHg MR Vmax:         457.00 cm/s MR Vmean:        345.0 cm/s MR PISA:         6.28 cm MR PISA Eff ROA: 42 mm MR PISA Radius:  1.00 cm Dalton McleanMD Electronically signed by Wilfred Lacy Signature Date/Time: 08/09/2023/4:23:38 PM    Final    EP STUDY  Result Date: 08/09/2023 See surgical note for result.  ECHOCARDIOGRAM COMPLETE  Result Date: 08/07/2023    ECHOCARDIOGRAM REPORT   Patient Name:   JOELL PROHASKA Date of Exam: 08/07/2023 Medical Rec #:  [id number]        Height:       66.0 in Accession #:    [id number]       Weight:       208.6 lb Date of Birth:  Jul 21, 1941         BSA:          2.036 m Patient Age:    82 years         BP:           106/66 mmHg Patient Gender: M                HR:           88 bpm. Exam Location:  Inpatient Procedure: 2D Echo, Cardiac Doppler and Color Doppler Indications:    Mitral Valve Disorder  History:        Patient has prior history of Echocardiogram examinations, most                 recent 07/17/2023. Pacemaker and Defibrillator; Risk                 Factors:Hypertension, Diabetes and Sleep Apnea.  Sonographer:    Karma Ganja Referring Phys: [phone] DALTON S MCLEAN IMPRESSIONS  1. Technically difficult study. Left ventricular ejection fraction, by estimation, is 45 to 50%. The left ventricle has mildly decreased function. The left ventricle demonstrates global hypokinesis. The left ventricular internal cavity size was mildly dilated. There is mild left ventricular hypertrophy. Left ventricular diastolic parameters are indeterminate.  2. Right ventricular systolic function is normal. The right ventricular size is mildly enlarged. There is severely elevated pulmonary artery systolic  pressure. The estimated right ventricular systolic pressure is 71.2 mmHg.  3. Left atrial size was mildly dilated.  4. Right atrial size was severely dilated.  5. The aortic valve is tricuspid. Aortic valve regurgitation is trivial. No aortic stenosis is present.  6. The mitral valve is abnormal. Mild mitral valve regurgitation. No evidence of mitral stenosis. Mitral regurgitation not well visualized on current study. Does appear improved from prior echo 07/2023 but suspect underestimating severity of MR given poor visualization. Suggest cardiac MRI to quantify MR severity FINDINGS  Left Ventricle: Left ventricular ejection fraction, by estimation, is 45 to 50%. The left ventricle has mildly decreased function. The left ventricle demonstrates global hypokinesis. The left ventricular internal cavity size was mildly dilated. There is  mild left ventricular hypertrophy. Left ventricular diastolic parameters are indeterminate. Right Ventricle: The right ventricular size is mildly enlarged. No increase in right ventricular wall thickness. Right ventricular systolic function is normal. There is severely elevated pulmonary artery systolic pressure. The tricuspid regurgitant velocity  is 4.13 m/s, and with an assumed right atrial pressure of 3 mmHg, the estimated right ventricular systolic pressure is 71.2 mmHg. Left Atrium: Left atrial size was mildly dilated. Right Atrium: Right atrial size was severely dilated. Pericardium: There is no evidence of pericardial effusion. Presence of epicardial fat layer. Mitral Valve: The mitral valve is abnormal. Mild mitral valve regurgitation. No evidence of mitral valve stenosis. Tricuspid Valve: The tricuspid valve is normal in structure. Tricuspid valve regurgitation is trivial. Aortic Valve: The aortic valve is tricuspid. Aortic valve regurgitation is trivial. Aortic regurgitation PHT measures 676 msec. No aortic stenosis is present. Aortic valve mean gradient measures 3.0 mmHg.  Aortic valve peak gradient measures 7.0 mmHg. Aortic valve area, by VTI measures 1.58 cm. Pulmonic Valve: The pulmonic valve was not well visualized. Pulmonic valve regurgitation is not visualized. Aorta: The aortic root is normal in size and structure. IAS/Shunts: The interatrial septum was not well visualized.  LEFT VENTRICLE PLAX 2D LVIDd:         6.00 cm      Diastology LVIDs:         4.40 cm      LV e' medial:    5.87 cm/s LV PW:         1.20 cm      LV E/e' medial:  17.4 LV IVS:        1.20 cm      LV e' lateral:   12.30 cm/s LVOT diam:     2.00 cm      LV E/e' lateral: 8.3 LV SV:         44 LV SV Index:   22 LVOT Area:     3.14 cm  LV Volumes (MOD) LV vol d, MOD A2C: 58.7 ml LV vol d, MOD A4C: 103.0 ml LV vol s, MOD A2C: 33.5 ml LV vol s, MOD A4C: 56.1 ml LV SV MOD A2C:     25.2 ml LV SV MOD A4C:     103.0 ml LV SV MOD BP:      35.4 ml RIGHT VENTRICLE RV Basal diam:  4.20 cm RV S prime:     12.20 cm/s TAPSE (M-mode): 3.2 cm LEFT ATRIUM             Index        RIGHT ATRIUM           Index LA Vol (A2C):   75.6 ml 37.14 ml/m  RA Area:     30.30 cm LA Vol (A4C):   89.0 ml 43.72 ml/m  RA Volume:   115.00 ml 56.49 ml/m LA Biplane Vol: 83.3 ml 40.92 ml/m  AORTIC VALVE AV Area (Vmax):    1.80 cm AV Area (Vmean):   1.76 cm AV Area (VTI):     1.58 cm AV Vmax:           132.00 cm/s AV Vmean:          85.900 cm/s AV VTI:            0.278 m AV Peak Grad:      7.0 mmHg AV Mean Grad:      3.0 mmHg LVOT Vmax:         75.70 cm/s LVOT Vmean:        48.100 cm/s LVOT VTI:          0.140 m LVOT/AV VTI ratio: 0.50 AI PHT:            676 msec  AORTA Ao Root diam: 4.20 cm MITRAL VALVE                TRICUSPID VALVE MV Area (PHT): 4.01 cm     TR Peak grad:   68.2 mmHg MV Decel Time: 189 msec     TR Vmax:        413.00 cm/s MR Peak grad: 83.9 mmHg MR Mean grad: 48.0 mmHg     SHUNTS MR Vmax:      458.00 cm/s   Systemic VTI:  0.14 m MR Vmean:     314.0 cm/s    Systemic Diam: 2.00 cm MV E velocity: 102.00 cm/s MV A velocity:  46.70 cm/s MV E/A ratio:  2.18 Epifanio Lesches MD Electronically signed by Epifanio Lesches MD Signature Date/Time: 08/07/2023/6:03:40 PM    Final    DG Chest 2 View  Result Date: 08/06/2023 CLINICAL DATA:  Congestive heart failure. EXAM: CHEST - 2 VIEW COMPARISON:  October 10, 2022. FINDINGS: Stable cardiomediastinal silhouette. Left-sided defibrillator is unchanged. Left lung is clear. Increased right lung opacities are noted concerning for multifocal pneumonia or possibly asymmetric edema. Right-sided PICC line is unchanged. Bony thorax is unremarkable. IMPRESSION: Increased right lung opacities are noted concerning for multifocal pneumonia or possibly asymmetric edema. Electronically Signed   By: Lupita Raider M.D.   On: 08/06/2023 10:21   ECHO TEE  Result Date: 08/05/2023    TRANSESOPHOGEAL ECHO REPORT   Patient Name:   GIEZI BURBACH Date of Exam: 08/05/2023 Medical Rec #:  [id number]        Height:       66.0 in Accession #:    [id number]       Weight:       208.0 lb Date of Birth:  02/09/1941         BSA:          2.033 m Patient Age:    82 years         BP:           117/73 mmHg Patient Gender: M                HR:           59 bpm. Exam Location:  Inpatient Procedure: Transesophageal Echo, Cardiac Doppler, Color Doppler and 3D Echo Indications:    I34.0 Nonrheumatic mitral (valve) insufficiency  History:        Patient has prior history of Echocardiogram examinations, most                 recent 07/17/2023. Cardiomyopathy and CHF, CAD, Abnormal ECG and                 Pacemaker, Arrythmias:Atrial Fibrillation, Signs/Symptoms:Chest                 Pain; Risk Factors:Hypertension, Dyslipidemia and Sleep Apnea.  Sonographer:    Sheralyn Boatman RDCS Referring Phys: [id number] [facility] PROCEDURE: After discussion of the risks and benefits of a TEE, an informed consent was obtained from the patient. The transesophogeal probe was passed without difficulty through the esophogus of the patient. Imaged  were obtained with the patient in a left lateral decubitus position. Sedation performed by different physician. The patient was monitored while under deep sedation. Anesthestetic sedation was provided intravenously by Anesthesiology: 206mg  of Propofol. The patient developed no complications during the procedure.  IMPRESSIONS  1. Left ventricular ejection fraction, by estimation, is 45 to 50%. The left ventricle  has mildly decreased function. The left ventricle demonstrates global hypokinesis.  2. Peak RV-RA gradient 51 mmHg. Right ventricular systolic function is mildly reduced. The right ventricular size is normal. There is severely elevated pulmonary artery systolic pressure.  3. Left atrial size was mildly dilated. No left atrial/left atrial appendage thrombus was detected.  4. The mitral valve is abnormal. There is restriction of the posterior leaflet predominantly in the P1/P2 region with several (looks like 3 predominant) jets of mitral regurgitation. PISA interrogation of the largest jet gives ERO 0.58 cm^2. 3-D vena contracta area 0.73 cm^2 incorporating all jets. There appeared to be slight systolic flow reversal in the left upper pulmonary vein doppler signal but not in the right pulmonary veins. Severe mitral valve regurgitation. No evidence of mitral stenosis. The mean mitral valve gradient is 1.0 mmHg.  5. The tricuspid valve is abnormal. Tricuspid valve regurgitation is moderate to severe.  6. The aortic valve is tricuspid. Aortic valve regurgitation is trivial. No aortic stenosis is present. FINDINGS  Left Ventricle: Left ventricular ejection fraction, by estimation, is 45 to 50%. The left ventricle has mildly decreased function. The left ventricle demonstrates global hypokinesis. The left ventricular internal cavity size was normal in size. Right Ventricle: Peak RV-RA gradient 51 mmHg. The right ventricular size is normal. No increase in right ventricular wall thickness. Right ventricular systolic  function is mildly reduced. There is severely elevated pulmonary artery systolic pressure. Left Atrium: Left atrial size was mildly dilated. No left atrial/left atrial appendage thrombus was detected. Right Atrium: Right atrial size was normal in size. Pericardium: There is no evidence of pericardial effusion. Mitral Valve: There is restriction of the posterior leaflet predominantly in the P1/P2 region with several (looks like 3 predominant) jets of mitral regurgitation. PISA interrogation of the largest jet gives ERO 0.58 cm^2. 3-D vena contracta area 0.73 cm^2 incorporating all jets. There appeared to be slight systolic flow reversal in the left upper pulmonary vein doppler signal but not in the right pulmonary veins. The mitral valve is abnormal. Severe mitral valve regurgitation. No evidence of mitral valve stenosis. MV peak gradient, 2.5 mmHg. The mean mitral valve gradient is 1.0 mmHg. Tricuspid Valve: The tricuspid valve is abnormal. Tricuspid valve regurgitation is moderate to severe. Aortic Valve: The aortic valve is tricuspid. Aortic valve regurgitation is trivial. No aortic stenosis is present. Pulmonic Valve: The pulmonic valve was normal in structure. Pulmonic valve regurgitation is trivial. No evidence of pulmonic stenosis. Aorta: The aortic root is normal in size and structure. IAS/Shunts: No atrial level shunt detected by color flow Doppler. Additional Comments: A device lead is visualized in the right ventricle. Spectral Doppler performed. MITRAL VALVE                  TRICUSPID VALVE MV Peak grad: 2.5 mmHg        TR Peak grad:   51.6 mmHg MV Mean grad: 1.0 mmHg        TR Vmax:        359.00 cm/s MV Vmax:      0.79 m/s MV Vmean:     47.3 cm/s MR Peak grad:    78.9 mmHg MR Mean grad:    52.0 mmHg MR Vmax:         444.00 cm/s MR Vmean:        342.0 cm/s MR PISA:         7.60 cm MR PISA Eff ROA: 58 mm MR PISA Radius:  1.10 cm  [employer] Electronically signed by Wilfred Lacy Signature  Date/Time: 08/05/2023/4:57:03 PM    Final    EP STUDY  Result Date: 08/05/2023 See surgical note for result.  [place] EKG SITE RITE  Result Date: 08/05/2023 If Site Rite image not attached, placement could not be confirmed due to current cardiac rhythm.  CARDIAC CATHETERIZATION  Result Date: 08/05/2023 1. Elevated right and left heart filling pressures. 2. Prominent V-waves in the PCWP tracing suggestive of significant mitral regurgitation. 3. Low cardiac output, CI 1.9. 4. 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 I am going to admit for diuresis given markedly elevated PCWP.   CUP PACEART REMOTE DEVICE CHECK  Result Date: 08/01/2023 Known RRT from 06/04/2023 Next remote scheduled for 08/20/2023 ML, CVRS   ECG & Cardiac Imaging    EKG 08/19/2023: V-paced, HR 101bpm  TEE 07/09/2023:   1. Left ventricular ejection fraction, by estimation, is 45 to 50%. The  left ventricle has mildly decreased function. The left ventricle  demonstrates global hypokinesis. There is mild concentric left ventricular  hypertrophy.   2. Peak RV-RA gradient 43 mmHg. Right ventricular systolic function is  mildly reduced. The right ventricular size is normal.   3. Left atrial size was moderately dilated. No left atrial/left atrial  appendage thrombus was detected.   4. Right atrial size was mildly dilated.   5. No PFO or ASD by color doppler.   6. There is restriction of the posterior mitral leaflet predominantly in  the P1/P2 region with several (looks like 3 predominant) jets of mitral  regurgitation. PISA interrogation of the largest jet gives ERO 0.42 cm^2.  3-D vena contracta area 0.78 cm^2  incorporating all jets. There appeared to be slight systolic flow reversal  in the pulmonary vein PW doppler signal. Severe mitral valve  regurgitation, possibly atrial functional MR. No evidence of mitral  stenosis. The mean mitral valve gradient is 1.0  mmHg.   7. The tricuspid valve is  abnormal. Tricuspid valve regurgitation is  moderate.   8. The aortic valve is tricuspid. Aortic valve regurgitation is trivial.  No aortic stenosis is present.   Echocardiogram 08/07/2023:   1. Technically difficult study. Left ventricular ejection fraction, by  estimation, is 45 to 50%. The left ventricle has mildly decreased  function. The left ventricle demonstrates global hypokinesis. The left  ventricular internal cavity size was mildly  dilated. There is mild left ventricular hypertrophy. Left ventricular  diastolic parameters are indeterminate.   2. Right ventricular systolic function is normal. The right ventricular  size is mildly enlarged. There is severely elevated pulmonary artery  systolic pressure. The estimated right ventricular systolic pressure is  71.2 mmHg.   3. Left atrial size was mildly dilated.   4. Right atrial size was severely dilated.   5. The aortic valve is tricuspid. Aortic valve regurgitation is trivial.  No aortic stenosis is present.   6. The mitral valve is abnormal. Mild mitral valve regurgitation. No  evidence of mitral stenosis. Mitral regurgitation not well visualized on  current study. Does appear improved from prior echo 07/2023 but suspect  underestimating severity of MR given  poor visualization. Suggest cardiac MRI to quantify MR severity   RHC 08/05/2023:  1. Elevated right and left heart filling pressures.  2. Prominent V-waves in the PCWP tracing suggestive of significant mitral regurgitation.  3. Low cardiac output, CI 1.9.  4. 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    I am going to admit for diuresis given markedly  elevated PCWP.   Assessment & Plan    Severe mitral regurgitation: TEE with EF 45-50% with restriction of the posterior mitral leaflet predominantly in the P1/P2 region with 3 predominant jets of mitral regurgitation with mTEER scheduled for 11/20 with Dr. Lynnette Caffey.   Acute on chronic systolic  CHF: Follows closely with Dr. Shirlee Latch for GDMT. Reported increased SOB on pre-op call today therefore admitted for mTEER procedure tomorrow. Plan IV diuretics and mTEER tomorrow.   Persistent atrial fibrillation: s/p ablationDCCV 2021. Noted to have increased burden on device interrogation. Continue Amiodarone. Hold Xarelto in anticipation of mTEER tomorrow.   Biventricular MDT ICD: Pre procedure ICD cleared through device clinic. Reps notified. Device has reached ERI with plan for generator change 12/16 with Dr. Elberta Fortis.   Stage IIIb Chronic kidney disease: Baseline Cr appears to be in the 2.6 range. Continue to follow closely while inpatient. BMET in AM.   Severity of Illness: The appropriate patient status for this patient is INPATIENT. Inpatient status is judged to be reasonable and necessary in order to provide the required intensity of service to ensure the patient's safety. The patient's presenting symptoms, physical exam findings, and initial radiographic and laboratory data in the context of their chronic comorbidities is felt to place them at high risk for further clinical deterioration. Furthermore, it is not anticipated that the patient will be medically stable for discharge from the hospital within 2 midnights of admission.   * I certify that at the point of admission it is my clinical judgment that the patient will require inpatient hospital care spanning beyond 2 midnights from the point of admission due to high intensity of service, high risk for further deterioration and high frequency of surveillance required.*    Signed, Georgie Chard NP-C [facility] Pager: [phone] 08/20/2023, @NOW    ATTENDING ATTESTATION:  After conducting a review of all available clinical information with the care team, interviewing the patient, and performing a physical exam, I agree with the findings and plan described in this note.   GEN: No acute distress.   HEENT:  MMM, JVD ~8cm, no scleral  icterus Cardiac: RRR, no murmurs, rubs, or gallops.  Respiratory:  Decreased BS bilaterally. GI: Soft, nontender, non-distended  MS: No edema; No deformity. Neuro:  Nonfocal  Vasc:  +2 radial pulses  The patient is a 82 year old male with a history of longstanding atrial fibrillation on Xarelto, severe atrial functional mitral regurgitation, resolved nonischemic cardiomyopathy status post CRT-D with device at ERI, left bundle branch block, on chronic kidney disease stage III who was admitted due to shortness of breath and chest pain.  He is scheduled for elective mitral transcatheter edge-to-edge repair.  Admit the patient and will observe overnight.  Will start IV diuretics.  His EKG today demonstrates ventricular pacing with occasional PVCs.  He is asymptomatic.  We will monitor this for now.  Check magnesium, would hold on further diuretics for now.  Alverda Skeans, MD Pager [phone]

## 2023-08-21 ENCOUNTER — Inpatient Hospital Stay (HOSPITAL_COMMUNITY): Admission: RE | Admit: 2023-08-21 | Payer: Medicare PPO | Source: Home / Self Care | Admitting: Internal Medicine

## 2023-08-21 ENCOUNTER — Encounter (HOSPITAL_COMMUNITY)
Admission: AD | Disposition: A | Discharge status: Home / Self Care | Payer: Self-pay | Source: Home / Self Care | Attending: Internal Medicine

## 2023-08-21 ENCOUNTER — Other Ambulatory Visit: Payer: Self-pay

## 2023-08-21 ENCOUNTER — Inpatient Hospital Stay (HOSPITAL_COMMUNITY): Payer: Medicare PPO

## 2023-08-21 ENCOUNTER — Inpatient Hospital Stay (HOSPITAL_COMMUNITY): Payer: Self-pay | Admitting: Physician Assistant

## 2023-08-21 ENCOUNTER — Inpatient Hospital Stay (HOSPITAL_COMMUNITY): Payer: Self-pay

## 2023-08-21 ENCOUNTER — Encounter (HOSPITAL_COMMUNITY): Payer: Self-pay | Admitting: Internal Medicine

## 2023-08-21 DIAGNOSIS — I340 Nonrheumatic mitral (valve) insufficiency: Secondary | ICD-10-CM

## 2023-08-21 DIAGNOSIS — Z006 Encounter for examination for normal comparison and control in clinical research program: Secondary | ICD-10-CM

## 2023-08-21 DIAGNOSIS — I083 Combined rheumatic disorders of mitral, aortic and tricuspid valves: Secondary | ICD-10-CM

## 2023-08-21 DIAGNOSIS — I4891 Unspecified atrial fibrillation: Secondary | ICD-10-CM

## 2023-08-21 DIAGNOSIS — Z9889 Other specified postprocedural states: Secondary | ICD-10-CM

## 2023-08-21 DIAGNOSIS — I517 Cardiomegaly: Secondary | ICD-10-CM

## 2023-08-21 HISTORY — PX: TRANSCATHETER MITRAL EDGE TO EDGE REPAIR: CATH118311

## 2023-08-21 HISTORY — PX: TRANSESOPHAGEAL ECHOCARDIOGRAM (CATH LAB): EP1270

## 2023-08-21 HISTORY — DX: Other specified postprocedural states: Z98.890

## 2023-08-21 LAB — POCT ACTIVATED CLOTTING TIME
Activated Clotting Time: 193 s
Activated Clotting Time: 233 s
Activated Clotting Time: 268 s
Activated Clotting Time: 297 s

## 2023-08-21 LAB — CBC
HCT: 35.2 % — ABNORMAL LOW (ref 39.0–52.0)
Hemoglobin: 11.2 g/dL — ABNORMAL LOW (ref 13.0–17.0)
MCH: 30.9 pg (ref 26.0–34.0)
MCHC: 31.8 g/dL (ref 30.0–36.0)
MCV: 97.2 fL (ref 80.0–100.0)
Platelets: 152 10*3/uL (ref 150–400)
RBC: 3.62 MIL/uL — ABNORMAL LOW (ref 4.22–5.81)
RDW: 14.5 % (ref 11.5–15.5)
WBC: 6 10*3/uL (ref 4.0–10.5)
nRBC: 0 % (ref 0.0–0.2)

## 2023-08-21 LAB — BASIC METABOLIC PANEL
Anion gap: 10 (ref 5–15)
BUN: 41 mg/dL — ABNORMAL HIGH (ref 8–23)
CO2: 28 mmol/L (ref 22–32)
Calcium: 8.1 mg/dL — ABNORMAL LOW (ref 8.9–10.3)
Chloride: 102 mmol/L (ref 98–111)
Creatinine, Ser: 2.37 mg/dL — ABNORMAL HIGH (ref 0.61–1.24)
GFR, Estimated: 27 mL/min — ABNORMAL LOW (ref >60)
Glucose, Bld: 112 mg/dL — ABNORMAL HIGH (ref 70–99)
Potassium: 4.2 mmol/L (ref 3.5–5.1)
Sodium: 140 mmol/L (ref 135–145)

## 2023-08-21 LAB — ECHO TEE
MV M vel: 3.89 m/s
MV Peak grad: 60.5 mm[Hg]
Radius: 1.05 cm

## 2023-08-21 LAB — ECHOCARDIOGRAM LIMITED
Height: 66 in
MV M vel: 4.02 m/s
MV Peak grad: 64.6 mm[Hg]
Radius: 0.8 cm
Weight: 3248 [oz_av]

## 2023-08-21 SURGERY — MITRAL VALVE REPAIR
Anesthesia: General

## 2023-08-21 MED ORDER — SODIUM CHLORIDE 0.9% FLUSH
3.0000 mL | INTRAVENOUS | Status: DC | PRN
Start: 2023-08-21 — End: 2023-08-22

## 2023-08-21 MED ORDER — LACTATED RINGERS IV SOLN
INTRAVENOUS | Status: DC | PRN
Start: 2023-08-21 — End: 2023-08-21

## 2023-08-21 MED ORDER — LACTATED RINGERS IV SOLN: INTRAVENOUS | Status: DC

## 2023-08-21 MED ORDER — SODIUM CHLORIDE 0.9% FLUSH
3.0000 mL | Freq: Two times a day (BID) | INTRAVENOUS | Status: DC
  Administered 2023-08-21 – 2023-08-22 (×3): 3 mL via INTRAVENOUS

## 2023-08-21 MED ORDER — HEPARIN SODIUM (PORCINE) 1000 UNIT/ML IJ SOLN
INTRAMUSCULAR | Status: AC
  Filled 2023-08-21: qty 20

## 2023-08-21 MED ORDER — AMIODARONE HCL IN DEXTROSE 360-4.14 MG/200ML-% IV SOLN
INTRAVENOUS | Status: AC
  Filled 2023-08-21: qty 200

## 2023-08-21 MED ORDER — HYDRALAZINE HCL 20 MG/ML IJ SOLN
5.0000 mg | INTRAMUSCULAR | Status: DC | PRN
Start: 2023-08-21 — End: 2023-08-22

## 2023-08-21 MED ORDER — FENTANYL CITRATE (PF) 250 MCG/5ML IJ SOLN
INTRAMUSCULAR | Status: DC | PRN
  Administered 2023-08-21: 100 ug via INTRAVENOUS

## 2023-08-21 MED ORDER — HEPARIN (PORCINE) IN NACL 2000-0.9 UNIT/L-% IV SOLN
INTRAVENOUS | Status: DC | PRN
  Administered 2023-08-21 (×4): 1000 mL

## 2023-08-21 MED ORDER — PHENYLEPHRINE HCL (PRESSORS) 10 MG/ML IV SOLN
INTRAVENOUS | Status: DC | PRN
  Administered 2023-08-21: 160 ug via INTRAVENOUS
  Administered 2023-08-21: 40 ug via INTRAVENOUS
  Administered 2023-08-21: 80 ug via INTRAVENOUS

## 2023-08-21 MED ORDER — SODIUM CHLORIDE 0.9 % IV SOLN
250.0000 mL | INTRAVENOUS | Status: DC | PRN
Start: 2023-08-21 — End: 2023-08-22

## 2023-08-21 MED ORDER — RIVAROXABAN 15 MG PO TABS
15.0000 mg | ORAL_TABLET | Freq: Every day | ORAL | Status: DC
  Administered 2023-08-21: 15 mg via ORAL
  Filled 2023-08-21 (×2): qty 1

## 2023-08-21 MED ORDER — SODIUM CHLORIDE 0.9 % IV SOLN
INTRAVENOUS | Status: DC | PRN
Start: 2023-08-21 — End: 2023-08-21

## 2023-08-21 MED ORDER — ACETAMINOPHEN 500 MG PO TABS
1000.0000 mg | ORAL_TABLET | Freq: Once | ORAL | Status: AC
  Administered 2023-08-21: 1000 mg via ORAL
  Filled 2023-08-21: qty 2

## 2023-08-21 MED ORDER — LIDOCAINE HCL (PF) 1 % IJ SOLN
INTRAMUSCULAR | Status: DC | PRN
  Administered 2023-08-21: 5 mL

## 2023-08-21 MED ORDER — LIDOCAINE 2% (20 MG/ML) 5 ML SYRINGE
INTRAMUSCULAR | Status: DC | PRN
  Administered 2023-08-21: 60 mg via INTRAVENOUS

## 2023-08-21 MED ORDER — LABETALOL HCL 5 MG/ML IV SOLN
10.0000 mg | INTRAVENOUS | Status: DC | PRN
Start: 2023-08-21 — End: 2023-08-22

## 2023-08-21 MED ORDER — ONDANSETRON HCL 4 MG/2ML IJ SOLN
INTRAMUSCULAR | Status: DC | PRN
  Administered 2023-08-21: 4 mg via INTRAVENOUS

## 2023-08-21 MED ORDER — NOREPINEPHRINE 4 MG/250ML-% IV SOLN
INTRAVENOUS | Status: DC | PRN
  Administered 2023-08-21: 5 ug/min via INTRAVENOUS

## 2023-08-21 MED ORDER — HEPARIN SODIUM (PORCINE) 1000 UNIT/ML IJ SOLN
INTRAMUSCULAR | Status: DC | PRN
  Administered 2023-08-21: 3000 [IU] via INTRAVENOUS
  Administered 2023-08-21: 10000 [IU] via INTRAVENOUS
  Administered 2023-08-21: 4000 [IU] via INTRAVENOUS

## 2023-08-21 MED ORDER — ROCURONIUM BROMIDE 10 MG/ML (PF) SYRINGE
PREFILLED_SYRINGE | INTRAVENOUS | Status: DC | PRN
  Administered 2023-08-21: 5 mg via INTRAVENOUS
  Administered 2023-08-21: 60 mg via INTRAVENOUS
  Administered 2023-08-21: 10 mg via INTRAVENOUS
  Administered 2023-08-21: 5 mg via INTRAVENOUS

## 2023-08-21 MED ORDER — HEPARIN SODIUM (PORCINE) 1000 UNIT/ML IJ SOLN
INTRAMUSCULAR | Status: AC
  Filled 2023-08-21: qty 10

## 2023-08-21 MED ORDER — SUGAMMADEX SODIUM 200 MG/2ML IV SOLN
INTRAVENOUS | Status: DC | PRN
  Administered 2023-08-21: 100 mg via INTRAVENOUS
  Administered 2023-08-21: 300 mg via INTRAVENOUS

## 2023-08-21 MED ORDER — CEFAZOLIN SODIUM-DEXTROSE 2-4 GM/100ML-% IV SOLN
INTRAVENOUS | Status: AC
  Filled 2023-08-21: qty 100

## 2023-08-21 MED ORDER — FENTANYL CITRATE (PF) 100 MCG/2ML IJ SOLN
INTRAMUSCULAR | Status: AC
  Filled 2023-08-21: qty 2

## 2023-08-21 MED ORDER — DEXAMETHASONE SODIUM PHOSPHATE 10 MG/ML IJ SOLN
INTRAMUSCULAR | Status: DC | PRN
  Administered 2023-08-21: 5 mg via INTRAVENOUS

## 2023-08-21 MED ORDER — PROPOFOL 10 MG/ML IV BOLUS
INTRAVENOUS | Status: DC | PRN
  Administered 2023-08-21: 100 mg via INTRAVENOUS
  Administered 2023-08-21 (×2): 50 mg via INTRAVENOUS

## 2023-08-21 SURGICAL SUPPLY — 16 items
CATH MITRA STEERABLE GUIDE (CATHETERS) IMPLANT
CLIP MITRA G4 DELIVERY SYS NTW (Clip) IMPLANT
CLIP MITRA G4 DELIVERY SYS XTW (Clip) IMPLANT
CLOSURE PERCLOSE PROSTYLE (VASCULAR PRODUCTS) IMPLANT
DILATOR VESSEL 22 FR (INTRODUCER) IMPLANT
KIT HEART LEFT (KITS) ×2 IMPLANT
KIT VERSACROSS LRG ACCESS (CATHETERS) IMPLANT
PACK CARDIAC CATHETERIZATION (CUSTOM PROCEDURE TRAY) ×1 IMPLANT
SHEATH PINNACLE 8F 10CM (SHEATH) IMPLANT
SHEATH PROBE COVER 6X72 (BAG) ×1 IMPLANT
STOPCOCK MORSE 400PSI 3WAY (MISCELLANEOUS) ×6 IMPLANT
SYSTEM MITRACLIP G4 (SYSTAGENIX WOUND MANAGEMENT) IMPLANT
TRANSDUCER W/STOPCOCK (MISCELLANEOUS) ×1 IMPLANT
TUBING ART PRESS 72 MALE/FEM (TUBING) ×1 IMPLANT
WIRE EMERALD 3MM-J .035X150CM (WIRE) IMPLANT
WIRE MICRO SET SILHO 5FR 7 (SHEATH) IMPLANT

## 2023-08-21 NOTE — Transfer of Care (Signed)
Immediate Anesthesia Transfer of Care Note  Patient: Chad Brown  Procedure(s) Performed: TRANSCATHETER MITRAL EDGE TO EDGE REPAIR TRANSESOPHAGEAL ECHOCARDIOGRAM  Patient Location: PACU and Cath Lab  Anesthesia Type:General  Level of Consciousness: awake, alert , and patient cooperative  Airway & Oxygen Therapy: Patient Spontanous Breathing and Patient connected to face mask oxygen  Post-op Assessment: Report given to RN and Post -op Vital signs reviewed and stable  Post vital signs: Reviewed and stable  Last Vitals:  Vitals Value Taken Time  BP 112/69 08/21/23 1156  Temp 36.6 C 08/21/23 1151  Pulse 59 08/21/23 1157  Resp 15 08/21/23 1157  SpO2 97 % 08/21/23 1157  Vitals shown include unfiled device data.  Last Pain:  Vitals:   08/21/23 1151  TempSrc: Oral  PainSc: 0-No pain      Patients Stated Pain Goal: 2 (08/21/23 0272)  Complications: There were no known notable events for this encounter.

## 2023-08-21 NOTE — TOC Initial Note (Signed)
Transition of Care [facility]) - Initial/Assessment Note    Patient Details  Name: Chad Brown MRN: [id number] Date of Birth: [birth year]  Transition of [facility]) CM/SW Contact:    Harriet Masson, RN Phone Number: 08/21/2023, 3:45 PM  Clinical Narrative:                  Spoke to patient, wife and son at bedside regarding transition needs. Patient uses walker due to shortness of breath. Patient has not had home health in the past.  Patient can afford medications and uses [employer] [facility]. Patient drives himself but wife can transport as needed.  Address, Phone number and PCP verified.  TOC will continue to follow for needs.   Expected Discharge Plan: Home/Self Care Barriers to Discharge: Continued Medical Work up   Patient Goals and CMS Choice Patient states their goals for this hospitalization and ongoing recovery are:: return home          Expected Discharge Plan and Services       Living arrangements for the past 2 months: Single Family Home                                      Prior Living Arrangements/Services Living arrangements for the past 2 months: Single Family Home Lives with:: Spouse, Pets Patient language and need for interpreter reviewed:: Yes Do you feel safe going back to the place where you live?: Yes      Need for Family Participation in Patient Care: Yes (Comment) Care giver support system in place?: Yes (comment) Current home services: DME (walker, cane, bipap) Criminal Activity/Legal Involvement Pertinent to Current Situation/Hospitalization: No - Comment as needed  Activities of Daily Living      Permission Sought/Granted                  Emotional Assessment Appearance:: Appears stated age   Affect (typically observed): Accepting Orientation: : Oriented to Self, Oriented to Place, Oriented to  Time, Oriented to Situation Alcohol / Substance Use: Not Applicable Psych Involvement: No (comment)  Admission diagnosis:   Severe mitral regurgitation [I34.0] Patient Active Problem List   Diagnosis Date Noted   Severe mitral regurgitation 08/20/2023   Postsurgical hypothyroidism 11/09/2022   Hypocalcemia 10/24/2021   Hypothyroidism 08/02/2021   Papillary thyroid carcinoma (HCC) 08/02/2021   Neoplasm of uncertain behavior of thyroid gland 05/05/2021   Pulmonary nodules 02/15/2021   Right thyroid nodule 02/15/2021   Atherosclerotic heart disease of native coronary artery without angina pectoris 02/23/2020   Heart failure, unspecified (HCC) 02/23/2020   Biventricular ICD (implantable cardioverter-defibrillator) in place 02/01/2020   Hypercoagulable state due to persistent atrial fibrillation (HCC) 12/01/2019   Statin myopathy 11/16/2019   Peripheral arterial disease (HCC) 05/20/2019   Melanoma of skin (HCC) 07/28/2018   Benign prostatic hyperplasia 11/02/2016   Iron deficiency anemia 05/25/2016   OSA (obstructive sleep apnea) 01/04/2016   Heme positive stool    Atrial fibrillation with RVR (HCC) 03/21/2015   Chronic systolic CHF (congestive heart failure) (HCC) 03/08/2015   Hx of adenomatous colonic polyps 02/10/2015   Pancreatic cyst 02/10/2015   Loose stools 02/10/2015   Encounter for long-term (current) use of antiplatelets/antithrombotics 01/10/2015   Hypotension 10/04/2014   Nonischemic cardiomyopathy (HCC) 10/04/2014   Hyperglycemia 10/04/2014   Troponin level elevated 10/04/2014   Sleep-disordered breathing 10/04/2014   Obesity 10/04/2014   Anemia, mild 10/04/2014  Thrombocytopenia (HCC) 10/04/2014   Atrial fibrillation with rapid ventricular response (HCC) 10/02/2014   Sinus bradycardia 10/02/2014   Acute on chronic systolic CHF (congestive heart failure) (HCC) 10/02/2014   Skin cancer 07/21/2014   cardiomyopathy 01/24/2013   Atrial fibrillation (HCC) 01/22/2013   Arthritis    Hyperlipidemia    Hypertension    Unspecified glaucoma 11/18/2012   Infected sebaceous cyst 05/15/2012    PCP:  Cleatis Polka., MD Pharmacy:   [facility] [place], [place] - [address] [place] [address] Phone: [phone] Fax: [phone]  [facility] [address] [place] [address] Phone: [phone] Fax: [phone]     Social Determinants of Health (SDOH) Social History: SDOH Screenings   Food Insecurity: No Food Insecurity (08/20/2023)  Housing: Low Risk  (08/20/2023)  Transportation Needs: No Transportation Needs (08/20/2023)  Utilities: Not At Risk (08/20/2023)  Depression (PHQ2-9): Low Risk  (11/03/2020)  Physical Activity: Inactive (11/03/2020)  Tobacco Use: Medium Risk (08/21/2023)   SDOH Interventions:     Readmission Risk Interventions     No data to display

## 2023-08-21 NOTE — Anesthesia Procedure Notes (Signed)
Procedure Name: Intubation Date/Time: 08/21/2023 8:58 AM  Performed by: Darlina Guys, CRNAPre-anesthesia Checklist: Patient identified, Emergency Drugs available, Suction available and Patient being monitored Patient Re-evaluated:Patient Re-evaluated prior to induction Oxygen Delivery Method: Circle system utilized Preoxygenation: Pre-oxygenation with 100% oxygen Induction Type: IV induction Ventilation: Oral airway inserted - appropriate to patient size Laryngoscope Size: Glidescope and 3 Grade View: Grade I Tube type: Oral Tube size: 7.5 mm Number of attempts: 1 Airway Equipment and Method: Stylet and Oral airway Placement Confirmation: ETT inserted through vocal cords under direct vision, positive ETCO2 and breath sounds checked- equal and bilateral Secured at: 23 cm Tube secured with: Tape Dental Injury: Teeth and Oropharynx as per pre-operative assessment  Comments: Patient with very limited cervical ROM. He required significant neck support with pillows prior to induction to keep his neck from bothering him (unable to extend neck). Therefore, GS used and pillows kept under patient. Patient verbalized his neck was comfortable prior to induction and his neck was kept in that position.

## 2023-08-21 NOTE — Addendum Note (Signed)
Addendum  created 08/21/23 1510 by Darlina Guys, CRNA   Flowsheet accepted, Intraprocedure Flowsheets edited, Intraprocedure Meds edited

## 2023-08-21 NOTE — Plan of Care (Signed)

## 2023-08-21 NOTE — Progress Notes (Signed)
  [facility]   MULTIDISCIPLINARY HEART VALVE TEAM  Patient doing well s/p TEER. He is hemodynamically stable. Groin site remains stable. Plan to transfer to from cath lab holding back to [facility]. Early ambulation after bedrest completed and hopeful discharge over the next 24 hours.   Georgie Chard NP-C Structural Heart Team  Phone: [phone]

## 2023-08-21 NOTE — Interval H&P Note (Signed)
History and Physical Interval Note:  08/21/2023 7:05 AM  Chad Brown  has presented today for surgery, with the diagnosis of severe mitral insufficiency.  The various methods of treatment have been discussed with the patient and family. After consideration of risks, benefits and other options for treatment, the patient has consented to  Procedure(s): TRANSCATHETER MITRAL EDGE TO EDGE REPAIR (N/A) TRANSESOPHAGEAL ECHOCARDIOGRAM (N/A) and POSSIBLE CARDIOVERSION as a surgical intervention.  The patient's history has been reviewed, patient examined, no change in status, stable for surgery.  I have reviewed the patient's chart and labs.  Questions were answered to the patient's satisfaction.     [name]

## 2023-08-21 NOTE — Progress Notes (Signed)
Rt radial arterial line removed and pressure held for 25 minutes. Level 0, 3+ rt radial pulse

## 2023-08-21 NOTE — Progress Notes (Signed)
Heart Failure Navigator Progress Note  Assessed for Heart & Vascular TOC clinic readiness.  Patient does not meet criteria due to Advanced Heart Failure Team patient of Dr. McLean.   Navigator will sign off at this time.   Mahreen Schewe, BSN, RN Heart Failure Nurse Navigator Secure Chat Only   

## 2023-08-21 NOTE — Anesthesia Postprocedure Evaluation (Signed)
Anesthesia Post Note  Patient: Chad Brown  Procedure(s) Performed: TRANSCATHETER MITRAL EDGE TO EDGE REPAIR TRANSESOPHAGEAL ECHOCARDIOGRAM     Patient location during evaluation: Cath Lab Anesthesia Type: General Level of consciousness: awake and alert Pain management: pain level controlled Vital Signs Assessment: post-procedure vital signs reviewed and stable Respiratory status: spontaneous breathing, nonlabored ventilation, respiratory function stable and patient connected to nasal cannula oxygen Cardiovascular status: blood pressure returned to baseline and stable Postop Assessment: no apparent nausea or vomiting Anesthetic complications: no  There were no known notable events for this encounter.  Last Vitals:  Vitals:   08/21/23 1225 08/21/23 1230  BP:  114/71  Pulse: 60 60  Resp: 11 14  Temp:    SpO2: 100% 96%    Last Pain:  Vitals:   08/21/23 1151  TempSrc: Oral  PainSc: 0-No pain                 Anjelo Pullman,W. EDMOND

## 2023-08-21 NOTE — Anesthesia Procedure Notes (Addendum)
Arterial Line Insertion Start/End11/20/2024 8:00 AM, 08/21/2023 8:15 AM Performed by: Darlina Guys, CRNA, CRNA  Patient location: Pre-op. Preanesthetic checklist: patient identified, IV checked, site marked, risks and benefits discussed, surgical consent, monitors and equipment checked, pre-op evaluation, timeout performed and anesthesia consent Lidocaine 1% used for infiltration Right, radial was placed Catheter size: 20 G Hand hygiene performed  and maximum sterile barriers used   Attempts: 2 Procedure performed using ultrasound guided technique. Following insertion, dressing applied. Post procedure assessment: normal and unchanged  Post procedure complications: unsuccessful attempts (1 unsuccessful attempt). Patient tolerated the procedure well with no immediate complications. Additional procedure comments: Attempt x1 with [place] by CRNA (Lidocaine skin wheel used); unsuccessful (saw blood return but it was venous). Attempt 2 with [place] by CRNA (no lidocaine); successful. [name]

## 2023-08-21 NOTE — Progress Notes (Signed)
Echocardiogram 2D Echocardiogram has been performed in short stay, Dr Lynnette Caffey at bedside.  Chad Brown 08/21/2023, 7:53 AM

## 2023-08-21 NOTE — Plan of Care (Signed)

## 2023-08-21 NOTE — Op Note (Signed)
    PROCEDURE:  Transcatheter edge to edge mitral valve repair (TEER) INDICATION: Severe symptomatic mitral regurgitation (Stage D)  SURGEON:  Alverda Skeans, MD  CO-SURGEON: Tonny Bollman, MD   PROCEDURAL DETAILS: General anesthesia is induced.  The patient is prepped and draped.  Baseline transesophageal echo images are obtained and confirm appropriate anatomy for transcatheter edge-to-edge mitral valve repair.  Using vascular ultrasound guidance, the right common femoral vein is accessed via a front wall puncture.  2 Perclose sutures are deployed and an 8 [place] sheath is inserted.  A versa cross wire is advanced into the SVC.  Heparin is administered and a therapeutic ACT is achieved.  Transseptal puncture is performed over the mid posterior portion of the fossa.  Caution is taken to avoid interaction with patient's pacing wires. The septum is dilated while the MitraClip steerable guide catheter is prepped.  After progressively dilating the right common femoral vein, the 24 French MitraClip steerable guide catheter is inserted and advanced across the interatrial septum.  The dilator and the versa cross wire were carefully removed and the guide tip is appropriately positioned approximately 3 cm across the interatrial septum.  A MitraClip XTW device is prepped per protocol.  The device is inserted through the steerable guide catheter with caution taken to avoid air entrapment.  The MitraClip device is positioned above the mitral valve after applying appropriate curve to the guide catheter.  The MitraClip device is then oriented coaxially with the anterior and posterior leaflets of the mitral valve.  The clip device is advanced across the mitral valve and pulled back until capture of both the anterior and posterior leaflets is achieved.  Grippers are dropped and the clip is closed under TEE guidance.  Careful TEE assessment is performed and reduction in mitral valve regurgitation is felt to be appropriate.   Leaflet insertion is verified using 3D imaging.  The MitraClip device is deployed using normal technique and the clip delivery system is removed.  After full TEE assessment, the procedural result is felt to be adequate with reduction of mitral regurgitation to 3+.   There is a cleft lateral to the clip which was positioned A2/P2. A MitraClip NTW is prepped with plans to deploy it lateral to the first clip. The clip is advanced into the LA, steered into position above the mitral valve, oriented properly, advanced across the valve, and brought back to capture leaflets in the A1/P1 position with good leaflet insertion. MR is reduced to 2+ with a residual leak between the clips. We felt the result was optimal considering the patient's mitral valve anatomy. The Clip is deployed per protocol.   PROCEDURE COMPLETION: The steerable guide catheter is pulled back into the right atrium and the interatrial septum is assessed with TEE.  There is no significant septal injury seen and no right to left shunting.  The guide catheter is removed and the Perclose sutures are tightened.  Protamine is administered.  CONCLUSION: Successful transcatheter edge-to-edge mitral valve repair under fluoroscopic and echo guidance, reducing baseline 4+ mitral regurgitation to 2+, clip 1 positioned A2/P2, clip 2 positioned A1/P1.  Tonny Bollman 08/21/2023 3:02 PM

## 2023-08-21 NOTE — Addendum Note (Signed)
Addendum  created 08/21/23 1455 by Darlina Guys, CRNA   Child order released for a procedure order, Clinical Note Signed, Intraprocedure Blocks edited, LDA created via procedure documentation, LDA updated via procedure documentation, SmartForm saved

## 2023-08-21 NOTE — Op Note (Signed)
[facility]   MULTIDISCIPLINARY HEART TEAM  Date of Procedure:  08/21/2023  Preoperative Diagnosis: Severe Symptomatic Mitral Regurgitation (Stage D)  Postoperative Diagnosis: Same   Procedure Performed: Ultrasound-guided right transfemoral venous access Double PreClose right femoral vein Transseptal puncture using [place] RF needle Mitral valve repair with MitraClip XTW (A2/P2) and NTW (A1/P1) Cardioversion  Surgeon: Alverda Skeans, MD   Echocardiographer: Charlton Haws, MD  Anesthesiologist: Gaynelle Adu, MD  Device Implant: Mitraclip XTW, Serial # [id number], NTW Serial # [id number]   Procedural Indication: Severe Non-rheumatic Mitral Regurgitation (Stage D)   Brief History: The patient is a 82 year old male with a history of chronic atrial fibrillation status post multiple cardioversions and ablation, CKD stage III, obstructive sleep apnea, chronic heart failure with reduced ejection fraction, and anemia who was seen in the outpatient setting due to severe symptomatic mitral regurgitation with NYHA class II symptoms.  After multidisciplinary review he was referred for elective mitral transcatheter edge-to-edge repair for severe symptomatic atrial functional mitral regurgitation.  Echo Findings: Preop:  Mild LV systolic dysfunction Severe MR secondary to atrial fibrillation, Grade 4+ NYHA class2 Post-op:  Unchanged LV systolic function 2+ residual MR  Procedural Details: Prep The patient is brought to the cardiac catheterization lab in the fasting state. General anesthesia is induced. The patient is prepped from the groin to chin. A foley catheter is placed. Hemodynamics are monitored via a radial artery line.   Venous Access Using ultrasound guidance, the right femoral vein is punctured. Ultrasound images are captured and stored in the patient's chart. The vein is dilated and 2 Perclose devices are deplyed at 10' and 2' positions to 'Preclose' the  femoral vein. An 8 Fr sheath is inserted.  Transseptal Puncture A [place] Versacross wire is advanced into the SVC A [place] transseptal dilator is advanced into the SVC, and the VersaCross RF wire is retracted into the dilator  The transseptal sheath is retracted into the RA under fluoroscopic and echo guidance to obtain position on the posterior fossa where echo measurements are made to assure appropriate access to the mitral valve. Once proper position is confirmed by echo, RF energy is delivered and the VersaCross wire is advanced into the LA without resistance. The dilator and sheath are advanced over the wire where proper position is confirmed by echo and pressure measurement Weight based IV heparin is administered and a therapeutic ACT > 250 is confirmed  Steerable Guide Catheter Insertion The VersaCross wire is positioned at the left upper pulmonary vein The femoral vein is progressively dilated and the 24 Fr Steerable guide catheter is inserted and then directed across the interatrial septum over the wire. Position is confirmed approximately 3 cm into the left atrium The guide is de-aired   MitraClip Insertion The MitraClip XTW is prepped per protocol and inserted via the introducer into the steerable guide catheter The Clip Delivery System (CDS) is advanced under fluoro and echo guidance so that the sleeve markers are evenly spaced on each side of the guide marker  MitraClip Positioning in the Left Atrium (Supravalvular Alignment) M-knob is applied to bring the Clip towards the mitral valve. Echo guidance is used to avoid contact with LA structures. The Clip arms are opened to 180 degrees 2D and 3D TEE imaging is performed in multiple planes and the Clip is positioned and aligned above the valve using standard steering techniques   Entry into the Left Ventricle and Mitral Valve Leaflet Grasp The Clip is advanced across the mitral valve into  the LV, maintaining proper orientation The  Clip arms are opened to 120 degrees and the Clip is slowly retracted  Capture of both the anterior and posterior leaflets are visualized by echo and the grippers are dropped  MitraClip Deployment After extensive echo evaluation, reduction in mitral regurgitation is felt to be adequate Following standard protocol, the lock line is removed after testing the lock mechanism. The lock is rechecked and is shown to be intact. The MitraClip device is deployed and the clip delivery system is removed under echo guidance with caution taken to avoid contact with LA structures  Placement of Clip #2 MitraClip Insertion The MitraClip NTW is prepped per protocol and inserted via the introducer into the steerable guide catheter The Clip Delivery System (CDS) is advanced under fluoro and echo guidance so that the sleeve markers are evenly spaced on each side of the guide marker  MitraClip Positioning in the Left Atrium (Supravalvular Alignment) M-knob is applied to bring the Clip towards the mitral valve. Echo guidance is used to avoid contact with LA structures. Low tidal volume respiration is initiated The Clip arms are opened to 180 degrees 2D and 3D TEE imaging is performed in multiple planes and the Clip is positioned and aligned above the valve using standard steering techniques   Entry into the Left Ventricle and Mitral Valve Leaflet Grasp The Clip is advanced across the mitral valve into the LV, maintaining proper orientation and with caution taken to avoid contact with the first Clip The Clip arms are opened further and the Clip is slowly retracted  Capture of both the anterior and posterior leaflets are visualized by echo and the grippers are dropped  MitraClip Deployment After extensive echo evaluation, reduction in mitral regurgitation is felt to be adequate Following standard protocol, the MitraClip device is deployed, gripper line and lock line are removed  Device Removal The clip delivery  system is removed under echo guidance The steerable guide catheter is retracted into the right atrium and the interatrial septum is assessed by echo without evidence of right-to-left shunting or large ASD  Hemostasis The guide catheter is removed over a 0.035" wire and the Perclose sutures are tightened with complete hemostasis and no evidence of hematoma  Cardioversion The patient was successfully cardioverted to a paced rhythm with 160 J in a synchronized fashion.  Estimated blood loss: minimal  There are no immediate procedural complications. The patient is transferred to the post-procedure recovery area in stable condition.   [name] 08/21/2023 11:27 AM

## 2023-08-21 NOTE — Plan of Care (Signed)
  Problem: Education: Goal: Knowledge of General Education information will improve Description: Including pain rating scale, medication(s)/side effects and non-pharmacologic comfort measures Outcome: Progressing   Problem: Health Behavior/Discharge Planning: Goal: Ability to manage health-related needs will improve Outcome: Progressing   Problem: Clinical Measurements: Goal: Ability to maintain clinical measurements within normal limits will improve Outcome: Progressing Goal: Will remain free from infection Outcome: Progressing   Problem: Activity: Goal: Risk for activity intolerance will decrease Outcome: Progressing   Problem: Nutrition: Goal: Adequate nutrition will be maintained Outcome: Progressing   Problem: Coping: Goal: Level of anxiety will decrease Outcome: Progressing   Problem: Elimination: Goal: Will not experience complications related to bowel motility Outcome: Progressing Goal: Will not experience complications related to urinary retention Outcome: Progressing

## 2023-08-22 ENCOUNTER — Encounter (HOSPITAL_COMMUNITY): Payer: Self-pay | Admitting: Internal Medicine

## 2023-08-22 ENCOUNTER — Inpatient Hospital Stay (HOSPITAL_COMMUNITY): Payer: Medicare PPO

## 2023-08-22 DIAGNOSIS — Z954 Presence of other heart-valve replacement: Secondary | ICD-10-CM | POA: Diagnosis not present

## 2023-08-22 DIAGNOSIS — I340 Nonrheumatic mitral (valve) insufficiency: Secondary | ICD-10-CM

## 2023-08-22 DIAGNOSIS — Z9889 Other specified postprocedural states: Secondary | ICD-10-CM

## 2023-08-22 DIAGNOSIS — I130 Hypertensive heart and chronic kidney disease with heart failure and stage 1 through stage 4 chronic kidney disease, or unspecified chronic kidney disease: Secondary | ICD-10-CM | POA: Diagnosis not present

## 2023-08-22 DIAGNOSIS — Z1152 Encounter for screening for COVID-19: Secondary | ICD-10-CM | POA: Diagnosis not present

## 2023-08-22 DIAGNOSIS — Z006 Encounter for examination for normal comparison and control in clinical research program: Secondary | ICD-10-CM | POA: Diagnosis not present

## 2023-08-22 LAB — CBC
HCT: 30.8 % — ABNORMAL LOW (ref 39.0–52.0)
Hemoglobin: 10 g/dL — ABNORMAL LOW (ref 13.0–17.0)
MCH: 31.1 pg (ref 26.0–34.0)
MCHC: 32.5 g/dL (ref 30.0–36.0)
MCV: 95.7 fL (ref 80.0–100.0)
Platelets: 135 10*3/uL — ABNORMAL LOW (ref 150–400)
RBC: 3.22 MIL/uL — ABNORMAL LOW (ref 4.22–5.81)
RDW: 14.6 % (ref 11.5–15.5)
WBC: 10 10*3/uL (ref 4.0–10.5)
nRBC: 0 % (ref 0.0–0.2)

## 2023-08-22 LAB — BASIC METABOLIC PANEL
Anion gap: 10 (ref 5–15)
BUN: 43 mg/dL — ABNORMAL HIGH (ref 8–23)
CO2: 25 mmol/L (ref 22–32)
Calcium: 7.9 mg/dL — ABNORMAL LOW (ref 8.9–10.3)
Chloride: 99 mmol/L (ref 98–111)
Creatinine, Ser: 2.44 mg/dL — ABNORMAL HIGH (ref 0.61–1.24)
GFR, Estimated: 26 mL/min — ABNORMAL LOW (ref >60)
Glucose, Bld: 162 mg/dL — ABNORMAL HIGH (ref 70–99)
Potassium: 4.6 mmol/L (ref 3.5–5.1)
Sodium: 134 mmol/L — ABNORMAL LOW (ref 135–145)

## 2023-08-22 LAB — ECHOCARDIOGRAM COMPLETE
Area-P 1/2: 2.39 cm2
Height: 66 in
MV VTI: 1.17 cm2
S' Lateral: 3.8 cm
Weight: 3382.74 [oz_av]

## 2023-08-22 MED ORDER — AMIODARONE HCL 200 MG PO TABS
200.0000 mg | ORAL_TABLET | Freq: Three times a day (TID) | ORAL | Status: DC
  Administered 2023-08-22: 200 mg via ORAL
  Filled 2023-08-22: qty 1

## 2023-08-22 MED ORDER — AMIODARONE HCL 200 MG PO TABS: ORAL_TABLET | ORAL | 3 refills | Status: DC

## 2023-08-22 MED ORDER — FUROSEMIDE 10 MG/ML IJ SOLN
40.0000 mg | Freq: Once | INTRAMUSCULAR | Status: AC
  Administered 2023-08-22: 40 mg via INTRAVENOUS
  Filled 2023-08-22: qty 4

## 2023-08-22 MED ORDER — PERFLUTREN LIPID MICROSPHERE
1.0000 mL | INTRAVENOUS | Status: AC | PRN
  Administered 2023-08-22: 6 mL via INTRAVENOUS

## 2023-08-22 MED FILL — Fentanyl Citrate Preservative Free (PF) Inj 100 MCG/2ML: INTRAMUSCULAR | Qty: 2 | Status: CN

## 2023-08-22 MED FILL — Fentanyl Citrate Preservative Free (PF) Inj 100 MCG/2ML: INTRAMUSCULAR | Qty: 2 | Status: AC

## 2023-08-22 NOTE — TOC Transition Note (Signed)
Transition of Care [facility]) - CM/SW Discharge Note   Patient Details  Name: Chad Brown MRN: [id number] Date of Birth: 04-Apr-1941  Transition of [facility] CM/SW Contact:  Harriet Masson, RN Phone Number: 08/22/2023, 11:57 AM   Clinical Narrative:    Patient stable for discharge.  No TOC needs at this time.    Final next level of care: Home/Self Care Barriers to Discharge: Barriers Resolved   Patient Goals and CMS Choice    Return home  Discharge Placement          home               Discharge Plan and Services Additional resources added to the After Visit Summary for                                       Social Determinants of Health (SDOH) Interventions SDOH Screenings   Food Insecurity: No Food Insecurity (08/20/2023)  Housing: Low Risk  (08/20/2023)  Transportation Needs: No Transportation Needs (08/20/2023)  Utilities: Not At Risk (08/20/2023)  Depression (PHQ2-9): Low Risk  (11/03/2020)  Physical Activity: Inactive (11/03/2020)  Tobacco Use: Medium Risk (08/21/2023)     Readmission Risk Interventions     No data to display

## 2023-08-22 NOTE — Progress Notes (Signed)
CARDIAC REHAB PHASE 1     Pt resting in bed, feeling well. Pt ambulated in hallway with mobility team, tolerating well. Post TEER education including site care, restrictions, heart healthy diet, exercise guidelines and CRP2 reviewed. All questions and concerns addressed. Pt is not interested in CRP2 at this time. Plan for home later today.     [id number] Woodroe Chen, RN BSN 08/22/2023 10:17 AM

## 2023-08-22 NOTE — Discharge Summary (Addendum)
HEART AND VASCULAR CENTER   MULTIDISCIPLINARY HEART VALVE TEAM  Discharge Summary    Patient ID: Chad Brown MRN: [id number]; DOB: 25-Oct-1940  Admit date: 08/20/2023 Discharge date: 08/22/2023  Primary Care Provider: Cleatis Polka., MD  Primary Cardiologist: Marca Ancona, MD / Dr. Lynnette Caffey, MD and Dr. Excell Seltzer, MD (mTEER)  Discharge Diagnoses    Principal Problem:   Severe mitral regurgitation Active Problems:   Hyperlipidemia   Hypertension   Atrial fibrillation (HCC)   Acute on chronic systolic CHF (congestive heart failure) (HCC)   Atrial fibrillation with RVR (HCC)   S/P mitral valve clip implantation  Allergies Allergies  Allergen Reactions   Pravastatin Sodium Other (See Comments)    Joint and muscle pain   Azithromycin Diarrhea   Diagnostic Studies/Procedures    mTEER 08/21/23:   PROCEDURE:  Transcatheter edge to edge mitral valve repair (TEER) INDICATION: Severe symptomatic mitral regurgitation (Stage D)  There is a cleft lateral to the clip which was positioned A2/P2. A MitraClip NTW is prepped with plans to deploy it lateral to the first clip. The clip is advanced into the LA, steered into position above the mitral valve, oriented properly, advanced across the valve, and brought back to capture leaflets in the A1/P1 position with good leaflet insertion. MR is reduced to 2+ with a residual leak between the clips. We felt the result was optimal considering the patient's mitral valve anatomy. The Clip is deployed per protocol.    PROCEDURE COMPLETION: The steerable guide catheter is pulled back into the right atrium and the interatrial septum is assessed with TEE.  There is no significant septal injury seen and no right to left shunting.  The guide catheter is removed and the Perclose sutures are tightened.  Protamine is administered.   CONCLUSION: Successful transcatheter edge-to-edge mitral valve repair under fluoroscopic and echo guidance, reducing  baseline 4+ mitral regurgitation to 2+, clip 1 positioned A2/P2, clip 2 positioned A1/P1. _____________   Echo 08/22/2023: Completed but pending formal read at the time of discharge   History of Present Illness     Chad Brown is a 82 y.o. male with a history of  PAD, OSA on CPAP, chronic HFrEF, NICM (EF down to 15% at one point), LBBB, s/p MDT CRT-D (2016), PAF s/p multiple cardioversions and ablation (2021) on amiodarone/Xarelto, CKD stage IIIb, HTN, pulm HTN, thyroid carcinoma s/p thyroidectomy (2022), pulmonary nodules, anemia, and severe MR with NYHA II symptoms presented to [facility] 08/20/2023 for direct admission in anticipation of mTEER 08/21/23 due to worsening SOB concerning for acute HF.   Mr. Stotz was diagnosed atrial fibrillation in 2014. Echocardiogram at that time showed EF 30-35%. He underwent multiple cardioversions and with LBBB he is now s/p [employer] CRT-D placement by Dr. Johney Frame. By 11/2019 MR remained in the mild range by echo however, updated imaging this year showed progression with EF 40-45%, mild LV dilation, mildly decreased RV systolic function, moderate-severe MR with splay artifact and PISA ERO 0.44 cm^2, PASP 71 mmHg, dilated IVC.  At that time he was reporting progressive shortness of breath and weight gain with NYHA class III symptoms. TEE and RHC 08/05/23 showed EF 45-50% and severe functional MR with 3 adjacent jets, primarily originating from restricted P1/P2 portion of the mitral valve: ERO 0.55 cm^2 from largest jet. RHC with low CO and significantly elevated right and left heart filling pressures and he was admitted for volume optimization with Lasix and milrinone.    Structural heart was consulted  with plan to follow outpatient and schedule mTEER accordingly. In follow up with Dr. Lynnette Caffey, he was doing well therefore he was scheduled for 08/21/23. In pre-procedure call 11/19, the patient was noted to have increased SOB concerning for volume overload. Case  discussed with Dr. Lynnette Caffey and he was direct admitted to the hospital.    Hospital Course     Severe mitral regurgitation: s/p successful mitral transcatheter edge-to-edge repair with 1 XTW clip positioned at the A2 P2 position and 1 NTW clip positioned at the A1 P1 position. Echocardiogram performed today with pending results. Groin site with small superficial hematoma. Groin instructions reviewed. He has ambulated with CRI with no difficulty. Discharge instructions reviewed with the patient. Continue Xarelto monotherapy. No SBE needed as he is edentulous. Plan telephone TOC then in person AHF appointment 12/6 and AF Clinic appointment (same day). We will see him for one month follow up with repeat echocardiogram.    Acute on chronic HF/NICM: Followed closely by AHF team. Patient noted to have increased SOB on pre-op call therefore admitted for IV diuresis and mTEER. Appears euvolmic today although weight slightly up from baseline. Plan IV Lasix prior to discharge and resume home diuretics 11/22.   Atrial fibrillation with RVR: Long hx of AF followed by EP. Noted be in in rapid AF at admission therefore started in IV Amiodarone and Heparin. Xarelto had been held in the setting of mTEER. Patient cardioverted to NSR intra-op. Plan to increase home Amiodarone to 200 TID for one week then reduce to 200mg  BID until seen by EP. Continue Xarelto.   Right groin hematoma: Small localized groin hematoma. Instructions reviewed. Tender to touch but no acute pain. Plan to follow for worsening symptoms. Likely will dissipate with time.   CKD Stage IIIb: Baseline appears to be in the 2.3 range. Cr today at 2.44.   Hyponatremia: Slightly hyponatremic at 134 felt to be secondary to post op hydration.   LBBB s/p MDT CRT-D (2016): AV pacing on telemetry today. Plan generator change out 12/16 with Dr. Elberta Fortis.   Consultants: None    The patient has been seen and examined by Dr. Lynnette Caffey who feels that he is stable  and ready for discharge today, 08/22/2023.  _____________  Discharge Vitals Blood pressure 112/68, pulse 60, temperature 98.2 F (36.8 C), temperature source Oral, resp. rate 20, height 5\' 6"  (1.676 m), weight 95.9 kg, SpO2 95%.  Filed Weights   08/21/23 0538 08/21/23 0723 08/22/23 0633  Weight: 93.2 kg 92.1 kg 95.9 kg   General: Well developed, well nourished, NAD Lungs:Clear to ausculation bilaterally. No wheezes, rales, or rhonchi. Breathing is unlabored. Cardiovascular: RRR. Soft murmurs Abdomen: Soft, non-tender, non-distended. No obvious abdominal masses. Extremities: No edema. Right groin with small hematoma Neuro: Alert and oriented. No focal deficits. No facial asymmetry. MAE spontaneously. Psych: Responds to questions appropriately with normal affect.    Disposition   Pt is being discharged home today in good condition.  Follow-up Plans & Appointments    Follow-up Information     Filbert Schilder, NP Follow up on 08/26/2023.   Specialty: Cardiology Why: THIS IS A TELEPHONE VISIT so no need to come to the office this day. I will call you for a 10:40AM telephone meeting. Contact information: [address] [place] [address] [phone]                Discharge Instructions     Call MD for:  redness, tenderness, or signs of  infection (pain, swelling, redness, odor or green/yellow discharge around incision site)   Complete by: As directed    Call MD for:  severe uncontrolled pain   Complete by: As directed    Diet - low sodium heart healthy   Complete by: As directed    Discharge instructions   Complete by: As directed    Home Care Following Your MitraClip Procedure      If you have any questions or concerns you can call the structural heart office at [phone] during normal business hours 8am-4pm. If you have an urgent need after hours or on the weekend, please call [phone] to talk to the on call provider for general cardiology. If  you have an emergency that requires immediate attention, please call 911.   Groin Site Care Refer to this sheet in the next few weeks. These instructions provide you with information on caring for yourself after your procedure. Your caregiver may also give you more specific instructions. Your treatment has been planned according to current medical practices, but problems sometimes occur. Call your caregiver if you have any problems or questions after your procedure. HOME CARE INSTRUCTIONS You may shower 24 hours after the procedure. Remove the bandage (dressing) and gently wash the site with plain soap and water. Gently pat the site dry.  Do not apply powder or lotion to the site.  Do not sit in a bathtub, swimming pool, or whirlpool for 5 to 7 days.  No bending, squatting, or lifting anything over 10 pounds (4.5 kg) as directed by your caregiver.  Inspect the site at least twice daily.  Do not drive home if you are discharged the same day of the procedure. Have someone else drive you.  You may drive 72 hours after the procedure unless otherwise instructed by your caregiver.  What to expect: Any bruising will usually fade within 1 to 2 weeks.  Blood that collects in the tissue (hematoma) may be painful to the touch. It should usually decrease in size and tenderness within 1 to 2 weeks.  SEEK IMMEDIATE MEDICAL CARE IF: You have unusual pain at the groin site or down the affected leg.  You have redness, warmth, swelling, or pain at the groin site.  You have drainage (other than a small amount of blood on the dressing).  You have chills.  You have a fever or persistent symptoms for more than 72 hours.  You have a fever and your symptoms suddenly get worse.  Your leg becomes pale, cool, tingly, or numb.  You have bleeding from the site. Hold pressure on the site until it subsides.    After MitraClip Checklist  Check  Test Description  Follow up appointment in 1-2 weeks  Most of our patients  will see our structural heart APP or your primary cardiologist within 1-2 weeks. Your incision site will be checked and you will be cleared to resume all normal activities if you are doing well.    1 month echo and follow up  You will have an echo to check on your heart valve clip and be seen back in the office by our structural heart APP  Follow up with your primary cardiologist You will need to be seen by your primary cardiologist in the following 3-6 months after your 1 month appointment in the valve clinic. Often times your blood thinners will be changed. This is decided on a case by case basis.   1 year echo and follow up You will have  another echo to check on your heart valve after one year and be seen back in the office by our structural heart APP. This your last structural heart visit.  Bacterial endocarditis prophylaxis  You will have to take antibiotics for the rest of your life before all dental procedures (even dental cleanings) to protect your heart valve from potential infection. Antibiotics are also required before some surgeries. Please check with your cardiologist before scheduling any surgeries. Also, please make sure to tell us if you have a penicillin allergy as you will require an alternative antibiotic.   ______________  Your Implant Identification Card Following your procedure, you will receive an Implant Identification Card, which your doctor will fill out and which you must carry with you at all times. Show your Implant Identification Card if you report to an emergency room. This card identifies you as a patient who has had a MitraClip device implanted. If you require a magnetic resonance imaging (MRI) scan, tell your doctor or MRI technician that you have a MitraClip device implanted. Test results indicate that patients with the MitraClip device can safely undergo MRI scans under certain conditions described on the card.   Increase activity slowly   Complete by: As directed        Discharge Medications   Allergies as of 08/22/2023       Reactions   Pravastatin Sodium Other (See Comments)   Joint and muscle pain   Azithromycin Diarrhea        Medication List     TAKE these medications    amiodarone 200 MG tablet Commonly known as: PACERONE Take 1 tablet (200 mg total) by mouth 3 (three) times daily for 7 days, THEN 1 tablet (200 mg total) 2 (two) times daily. Start taking on: August 22, 2023 What changed: See the new instructions.   cholecalciferol 25 MCG (1000 UNIT) tablet Commonly known as: VITAMIN D3 Take 1,000 Units by mouth in the morning.   dorzolamide-timolol 2-0.5 % ophthalmic solution Commonly known as: COSOPT Place 1 drop into the right eye 2 (two) times daily.   empagliflozin 10 MG Tabs tablet Commonly known as: Jardiance Take 1 tablet (10 mg total) by mouth daily.   escitalopram 20 MG tablet Commonly known as: LEXAPRO Take 1 tablet (20 mg total) by mouth daily. What changed: when to take this   finasteride 5 MG tablet Commonly known as: PROSCAR Take 1 tablet (5 mg total) by mouth daily. What changed: when to take this   FOLIC ACID PO Take 1 tablet by mouth daily.   gabapentin 100 MG capsule Commonly known as: NEURONTIN Take 200 mg by mouth 2 (two) times daily.   IRON PO Take 9 mg by mouth in the morning. Chewable   latanoprost 0.005 % ophthalmic solution Commonly known as: XALATAN Place 1 drop into the right eye daily at 12 noon.   levothyroxine 137 MCG tablet Commonly known as: SYNTHROID TAKE ONE TABLET DAILY BEFORE BREAKFAST. must make appointment FOR refills   Polyethyl Glycol-Propyl Glycol 0.4-0.3 % Soln Place 1 drop into the left eye in the morning and at bedtime.   Repatha SureClick 140 MG/ML Soaj Generic drug: Evolocumab INJECT 140MG  ( ) INTO SKIN EVERY 2 WEEKS   Rhopressa 0.02 % Soln Generic drug: Netarsudil Dimesylate Place 1 drop into the right eye at bedtime.   tamsulosin 0.4 MG Caps  capsule Commonly known as: FLOMAX Take 0.4 mg by mouth at bedtime.   torsemide 20 MG tablet Commonly known as: DEMADEX  Take 3 tablets (60 mg total) by mouth daily.   VITAMIN B 12 PO Take 500 mcg by mouth in the morning.   Xarelto 15 MG Tabs tablet Generic drug: Rivaroxaban TAKE (1) TABLET DAILY WITH SUPPER. What changed: See the new instructions.        Outstanding Labs/Studies   None   Duration of Discharge Encounter   Greater than 30 minutes including physician time.  SignedGeorgie Chard, NP 08/22/2023, 11:16 AM [phone]   ATTENDING ATTESTATION:  After conducting a review of all available clinical information with the care team, interviewing the patient, and performing a physical exam, I agree with the findings and plan described in this note.   GEN: No acute distress.   HEENT:  MMM, no JVD, no scleral icterus Cardiac: iRRR, no murmurs, rubs, or gallops.  Respiratory: Clear to auscultation bilaterally. GI: Soft, nontender, non-distended  MS: No edema; No deformity.  Small hematoma R groin Neuro:  Nonfocal  Vasc:  +2 radial pulses  The patient is doing well after uncomplicated mitral transcatheter edge-to-edge repair with an XTW clip and an NTW clip.  The patient has a cleft of his posterior mitral valve leaflet and there is residual mitral regurgitation through this cleft.  The patient was also cardioverted from atrial fibrillation to a paced rhythm.  We will discharge the patient on amiodarone 200 3 times daily and plan to decrease to 200 twice daily at 1 week follow-up.  Continue Xarelto.  Discharge today with close hospital follow-up.  Alverda Skeans, MD Pager [phone]

## 2023-08-22 NOTE — Progress Notes (Signed)
Echocardiogram 2D Echocardiogram has been performed.  Warren Lacy Laticha Ferrucci RDCS 08/22/2023, 9:35 AM

## 2023-08-22 NOTE — Progress Notes (Signed)
Went over AVS paperwork with patient/family, answered any and all questions, removed ivs, patient got dressed and wheeled out to family's vehicle

## 2023-08-22 NOTE — Progress Notes (Signed)
Mobility Specialist Progress Note:   08/22/23 0930  Mobility  Activity Ambulated with assistance in hallway  Level of Assistance Contact guard assist, steadying assist  Assistive Device None  Distance Ambulated (ft) 200 ft  Activity Response Tolerated well  Mobility Referral Yes  $Mobility charge 1 Mobility  Mobility Specialist Start Time (ACUTE ONLY) 0930  Mobility Specialist Stop Time (ACUTE ONLY) 0940  Mobility Specialist Time Calculation (min) (ACUTE ONLY) 10 min   Pt eager for mobility session. Only required contact assist for safety and occasional minor unsteadiness. No c/o throughout. Pt left sitting EOB with all needs met.   [name] Mobility Specialist Please contact via SecureChat or  Rehab office at [phone]

## 2023-08-23 ENCOUNTER — Telehealth: Payer: Self-pay | Admitting: Cardiology

## 2023-08-23 NOTE — Telephone Encounter (Signed)
HEART AND VASCULAR CENTER   Structural Heart Team  Contacted the patient regarding discharge from [facility] on 08/22/2023  The patient understands to follow up with Georgie Chard on 08/26/2023 at 1040 for a TELEPHONE visit. All other follow ups reviewed and confirmed.  The patient understands discharge instructions? Yes  The patient understands medications and regimen? Yes   The patient understands to call with any questions or concerns prior to scheduled visit.      Patient Consent for Virtual Visit        Chad Brown has provided verbal consent on 08/23/2023 for a virtual visit (video or telephone).   CONSENT FOR VIRTUAL VISIT FOR:  Chad Brown  By participating in this virtual visit I agree to the following:  I hereby voluntarily request, consent and authorize [facility] and its employed or contracted physicians, physician assistants, nurse practitioners or other licensed health care professionals (the Practitioner), to provide me with telemedicine health care services (the "Services") as deemed necessary by the treating Practitioner. I acknowledge and consent to receive the Services by the Practitioner via telemedicine. I understand that the telemedicine visit will involve communicating with the Practitioner through live audiovisual communication technology and the disclosure of certain medical information by electronic transmission. I acknowledge that I have been given the opportunity to request an in-person assessment or other available alternative prior to the telemedicine visit and am voluntarily participating in the telemedicine visit.  I understand that I have the right to withhold or withdraw my consent to the use of telemedicine in the course of my care at any time, without affecting my right to future care or treatment, and that the Practitioner or I may terminate the telemedicine visit at any time. I understand that I have the right to inspect all  information obtained and/or recorded in the course of the telemedicine visit and may receive copies of available information for a reasonable fee.  I understand that some of the potential risks of receiving the Services via telemedicine include:  Delay or interruption in medical evaluation due to technological equipment failure or disruption; Information transmitted may not be sufficient (e.g. poor resolution of images) to allow for appropriate medical decision making by the Practitioner; and/or  In rare instances, security protocols could fail, causing a breach of personal health information.  Furthermore, I acknowledge that it is my responsibility to provide information about my medical history, conditions and care that is complete and accurate to the best of my ability. I acknowledge that Practitioner's advice, recommendations, and/or decision may be based on factors not within their control, such as incomplete or inaccurate data provided by me or distortions of diagnostic images or specimens that may result from electronic transmissions. I understand that the practice of medicine is not an exact science and that Practitioner makes no warranties or guarantees regarding treatment outcomes. I acknowledge that a copy of this consent can be made available to me via my patient portal [facility] MyChart), or I can request a printed copy by calling the office of [facility].    I understand that my insurance will be billed for this visit.   I have read or had this consent read to me. I understand the contents of this consent, which adequately explains the benefits and risks of the Services being provided via telemedicine.  I have been provided ample opportunity to ask questions regarding this consent and the Services and have had my questions answered to my satisfaction. I  give my informed consent for the services to be provided through the use of telemedicine in my medical care

## 2023-08-23 NOTE — Progress Notes (Unsigned)
Virtual Visit via Telephone Note   Because of Chad Brown's co-morbid illnesses, he is at least at moderate risk for complications without adequate follow up.  This format is felt to be most appropriate for this patient at this time.  The patient did not have access to video technology/had technical difficulties with video requiring transitioning to audio format only (telephone).  All issues noted in this document were discussed and addressed.  No physical exam could be performed with this format.  Please refer to the patient's chart for his consent to telehealth for [facility].   Date:  08/23/2023   ID:  Chad Brown, DOB 1941/05/11, MRN [id number] The patient was identified using 2 identifiers.  Patient Location: Home Provider Location: Office/Clinic   PCP:  Cleatis Polka., MD   [facility] Providers Cardiologist:  Marca Ancona, MD Sleep Medicine:  Armanda Magic, MD {  Evaluation Performed:  Follow-Up Visit  Chief Complaint:  TOC call s/p mTEER  History of Present Illness:    Chad Brown is a 82 y.o. male with a history of  PAD, OSA on CPAP, chronic HFrEF, NICM (EF down to 15% at one point), LBBB, s/p MDT CRT-D (2016), PAF s/p multiple cardioversions and ablation (2021) on amiodarone/Xarelto, CKD stage IIIb, HTN, pulm HTN, thyroid carcinoma s/p thyroidectomy (2022), pulmonary nodules, anemia, and severe MR with NYHA II symptoms who is s/p mTEER and was contacted for [facility] evaluation.    Mr. Haar was diagnosed atrial fibrillation in 2014. Echocardiogram at that time showed EF 30-35%. He underwent multiple cardioversions and with LBBB he is now s/p [employer] CRT-D placement by Dr. Johney Frame. By 11/2019 MR remained in the mild range by echo however, updated imaging this year showed progression with EF 40-45%, mild LV dilation, mildly decreased RV systolic function, moderate-severe MR with splay artifact and PISA ERO 0.44 cm^2, PASP 71 mmHg, dilated  IVC.  At that time he was reporting progressive shortness of breath and weight gain with NYHA class III symptoms. TEE and RHC 08/05/23 showed EF 45-50% and severe functional MR with 3 adjacent jets, primarily originating from restricted P1/P2 portion of the mitral valve: ERO 0.55 cm^2 from largest jet. RHC with low CO and significantly elevated right and left heart filling pressures and he was admitted for volume optimization with Lasix and milrinone.    Structural heart was consulted with plan to follow outpatient and schedule mTEER accordingly. In follow up with Dr. Lynnette Caffey, he was doing well therefore he was scheduled for 08/21/23. In pre-procedure call 11/19, the patient was noted to have increased SOB concerning for volume overload. Case discussed with Dr. Lynnette Caffey and he was direct admitted to the hospital.    He is now s/p successful mitral transcatheter edge-to-edge repair with 1 XTW clip positioned at the A2 P2 position and 1 NTW clip positioned at the A1 P1 position. Post procedure echocardiogram with EF 45-50% with stable Mitraclip placement and mild residual MR with a mean gradient at . He was continued on Xarelto monotherapy.   Today he reports that he has been well since hospital discharge but remains a little fatigued. He denies chest pain, SOB, palpitations, LE edema, orthopnea, PND, dizziness, or syncope. Denies bleeding in stool or urine.    Past Medical History:  Diagnosis Date   Adenomatous colon polyp    tubular   AICD (automatic cardioverter/defibrillator) present 03/08/2015   MDT CRTD dual pacemaker and defib   Anemia  iron deficient   Arthritis    "about all my joints; hands, knees, back" (03/08/2015)   Atherosclerosis    Cataract    left eye small   Cholelithiasis    gallstones   Chronic systolic CHF (congestive heart failure) (HCC)    a. New dx 12/2012 ? NICM, may be r/t afib. b. Nuc 03/2013 - normal;  c. 03/2015 TEE EF 15-20%.   Colon polyp, hyperplastic 01/2015    removed precancerous lesions   Depression    Diverticulosis    Dysrhythmia    afib   GERD (gastroesophageal reflux disease)    Glaucoma    right eye   Hyperlipidemia    Hypertension    Melanoma of eye (HCC) 2000's   "right; it's never been biopsied"   Melanoma of lower leg (HCC) 2015   "left; right at my knee"   Myocardial infarction (HCC) 1998   OSA (obstructive sleep apnea) 01/04/2016   no longer tolerates cpap   Peripheral vision loss, right 2006   Persistent atrial fibrillation (HCC)    a. Dx 12/2012, s/p TEE/DCCV 01/26/13. b. On Xarelto (CHA2DS2VASc = 3);  c. 03/2015 TEE (EF 15-20%, no LAA thrombus) and DCCV - amio increased to 200 mg bid.   Pneumonia    S/P mitral valve clip implantation 08/21/2023   Placement of 1 XTW in the A2/P2 position and 1 NTW in the A1/P1 position by Dr. Lynnette Caffey and Dr. Excell Seltzer   Urinary hesitancy due to benign prostatic hypertrophy    Past Surgical History:  Procedure Laterality Date   ATRIAL FIBRILLATION ABLATION N/A 11/03/2019   Procedure: ATRIAL FIBRILLATION ABLATION;  Surgeon: Hillis Range, MD;  Location: [facility] INVASIVE CV LAB;  Service: Cardiovascular;  Laterality: N/A;   BACK SURGERY     upper back, cannot turn neck well   CARDIAC CATHETERIZATION  1998   CARDIOVERSION N/A 01/26/2013   Procedure: CARDIOVERSION;  Surgeon: Lewayne Bunting, MD;  Location: [facility] ENDOSCOPY;  Service: Cardiovascular;  Laterality: N/A;   CARDIOVERSION N/A 03/23/2015   Procedure: CARDIOVERSION;  Surgeon: Jake Bathe, MD;  Location: [facility] ENDOSCOPY;  Service: Cardiovascular;  Laterality: N/A;   CARDIOVERSION N/A 08/14/2017   Procedure: CARDIOVERSION;  Surgeon: Wendall Stade, MD;  Location: [facility] ENDOSCOPY;  Service: Cardiovascular;  Laterality: N/A;   CATARACT EXTRACTION Right ~ 2006   COLONOSCOPY WITH PROPOFOL N/A 02/10/2015   Procedure: COLONOSCOPY WITH PROPOFOL;  Surgeon: Iva Boop, MD;  Location: [facility] ENDOSCOPY;  Service: Endoscopy;  Laterality: N/A;   COLONOSCOPY WITH  PROPOFOL N/A 08/07/2016   Procedure: COLONOSCOPY WITH PROPOFOL;  Surgeon: Iva Boop, MD;  Location: [facility] ENDOSCOPY;  Service: Endoscopy;  Laterality: N/A;   ENTEROSCOPY N/A 08/17/2015   Procedure: ENTEROSCOPY;  Surgeon: Iva Boop, MD;  Location: [facility] ENDOSCOPY;  Service: Endoscopy;  Laterality: N/A;   EP IMPLANTABLE DEVICE N/A 03/08/2015   MDT Ovidio Kin CRT-D for nonischemic CM by Dr Johney Frame for primary prevention   GLAUCOMA SURGERY Right ~ 2006   "put 3 stents in to drain fluid" (03/08/2015) not successful, sent to [facility] to try to get last stent out   HOT HEMOSTASIS N/A 08/07/2016   Procedure: HOT HEMOSTASIS (ARGON PLASMA COAGULATION/BICAP);  Surgeon: Iva Boop, MD;  Location: [facility] ENDOSCOPY;  Service: Endoscopy;  Laterality: N/A;   INCISION AND DRAINAGE ABSCESS POSTERIOR CERVICALSPINE  05/2012   JOINT REPLACEMENT     MELANOMA EXCISION Left 2015   "lower leg; right at my knee"   REFRACTIVE SURGERY Right ~  2006 X 2   "twice; both done at [facility]" (03/08/2015   RIGHT HEART CATH N/A 08/05/2023   Procedure: RIGHT HEART CATH;  Surgeon: Laurey Morale, MD;  Location: [facility] INVASIVE CV LAB;  Service: Cardiovascular;  Laterality: N/A;   SURGERY SCROTAL / TESTICULAR Right 1990's   TEE WITHOUT CARDIOVERSION N/A 01/26/2013   Procedure: TRANSESOPHAGEAL ECHOCARDIOGRAM (TEE);  Surgeon: Lewayne Bunting, MD;  Location: [facility] ENDOSCOPY;  Service: Cardiovascular;  Laterality: N/A;  [name] anes. /    TEE WITHOUT CARDIOVERSION N/A 10/05/2014   Procedure: TRANSESOPHAGEAL ECHOCARDIOGRAM (TEE)  with cardioversion;  Surgeon: Vesta Mixer, MD;  Location: [facility] ENDOSCOPY;  Service: Cardiovascular;  Laterality: N/A;  12:52 synched cardioversion at 120 joules,...afib to SR...12 lead EKG ordered.Marland KitchenMarland KitchenCardiozem d/c'ed per MD verbal order at SR   TEE WITHOUT CARDIOVERSION N/A 03/23/2015   Procedure: TRANSESOPHAGEAL ECHOCARDIOGRAM (TEE);  Surgeon: Jake Bathe, MD;  Location: [facility] ENDOSCOPY;  Service: Cardiovascular;  Laterality: N/A;    TEE WITHOUT CARDIOVERSION N/A 11/02/2019   Procedure: TRANSESOPHAGEAL ECHOCARDIOGRAM (TEE);  Surgeon: Wendall Stade, MD;  Location: [facility] ENDOSCOPY;  Service: Cardiovascular;  Laterality: N/A;   TEE WITHOUT CARDIOVERSION N/A 08/05/2023   Procedure: TRANSESOPHAGEAL ECHOCARDIOGRAM;  Surgeon: Laurey Morale, MD;  Location: [facility] INVASIVE CV LAB;  Service: Cardiovascular;  Laterality: N/A;   THORACIC SPINE SURGERY  03/2000   "ground calcium deposits from upper thoracic" (01/26/2013)   THYROIDECTOMY N/A 05/11/2021   Procedure: TOTAL THYROIDECTOMY;  Surgeon: Darnell Level, MD;  Location: [facility] ORS;  Service: General;  Laterality: N/A;   TOTAL HIP ARTHROPLASTY Right 06/2007   TRANSCATHETER MITRAL EDGE TO EDGE REPAIR N/A 08/21/2023   Procedure: TRANSCATHETER MITRAL EDGE TO EDGE REPAIR;  Surgeon: [name], MD;  Location: [facility] INVASIVE CV LAB;  Service: Cardiovascular;  Laterality: N/A;   TRANSESOPHAGEAL ECHOCARDIOGRAM (CATH LAB) N/A 08/09/2023   Procedure: TRANSESOPHAGEAL ECHOCARDIOGRAM;  Surgeon: Laurey Morale, MD;  Location: [facility] INVASIVE CV LAB;  Service: Cardiovascular;  Laterality: N/A;   TRANSESOPHAGEAL ECHOCARDIOGRAM (CATH LAB) N/A 08/21/2023   Procedure: TRANSESOPHAGEAL ECHOCARDIOGRAM;  Surgeon: [name], MD;  Location: [facility] INVASIVE CV LAB;  Service: Cardiovascular;  Laterality: N/A;     No outpatient medications have been marked as taking for the 08/26/23 encounter (Appointment) with [facility].     Allergies:   Pravastatin sodium and Azithromycin   Social History   Tobacco Use   Smoking status: Former    Current packs/day: 0.00    Average packs/day: 3.0 packs/day for 48.0 years (144.0 ttl pk-yrs)    Types: Cigarettes    Start date: 09/28/1952    Quit date: 09/28/2000    Years since quitting: 22.9   Smokeless tobacco: Never  Vaping Use   Vaping status: Never Used  Substance Use Topics   Alcohol use: No   Drug use: No     Family Hx: The patient's family  history includes Colon cancer in his paternal uncle; Diabetes in his maternal aunt, maternal grandmother, maternal uncle, and mother; Heart attack in his maternal uncle; Heart disease in his mother; Kidney disease in his mother; Leukemia in his father; Lung cancer in his paternal uncle; Prostate cancer in his paternal uncle; Thyroid disease in his sister.  ROS:   Please see the history of present illness.     All other systems reviewed and are negative.  Labs/Other Tests and Data Reviewed:    EKG:  No ECG reviewed.  Recent Labs: 08/05/2023: TSH 0.700 08/19/2023: ALT 16 08/20/2023:  B Natriuretic Peptide 478.1; Magnesium 2.3 08/22/2023: BUN 43; Creatinine, Ser 2.44; Hemoglobin 10.0; Platelets 135; Potassium 4.6; Sodium 134   Recent Lipid Panel Lab Results  Component Value Date/Time   CHOL 103 07/17/2023 12:27 PM   TRIG 155 (H) 07/17/2023 12:27 PM   HDL 28 (L) 07/17/2023 12:27 PM   CHOLHDL 3.7 07/17/2023 12:27 PM   LDLCALC 44 07/17/2023 12:27 PM    Wt Readings from Last 3 Encounters:  08/22/23 211 lb 6.7 oz (95.9 kg)  08/14/23 202 lb 9.6 oz (91.9 kg)  08/09/23 206 lb 12.7 oz (93.8 kg)       Objective:    Vital Signs:  There were no vitals taken for this visit.   VITAL SIGNS:  reviewed GEN:  no acute distress  ASSESSMENT & PLAN:    Severe mitral regurgitation: s/p successful mitral transcatheter edge-to-edge repair with 1 XTW clip positioned at the A2 P2 position and 1 NTW clip positioned at the A1 P1 position. Echocardiogram with stable clip placement and mild residual MR with a mean gradient at . Continue Xarelto monotherapy. No SBE needed as he is edentulous. Plan AHF appointment 12/6 and AF Clinic appointment (same day) and one month OV with echo in one month with our team.     Chronic HF/NICM: Diuresed with IV lasix post procedure and was transitioned back to home torsemide. Denies HF symptoms today. Weight reported to be below recent baseline at 207lb this morning.     Atrial fibrillation: Noted to be in RVR during hospitalization. Amiodarone increased to 200 TID for one week then reduce to 200mg  BID until seen by EP. Continue Xarelto. HR improved per patient report.    Right groin hematoma: Denies pain or oozing at groin site. Small l CKD Stage IIIb: Baseline appears to be in the 2.3 range. Cr on day of discharge at 2.44   LBBB s/p MDT CRT-D (2016): Plan generator change out 12/16 with Dr. Elberta Fortis.      Time:   Today, I have spent 15 minutes with the patient with telehealth technology discussing the above problems.     Medication Adjustments/Labs and Tests Ordered: Current medicines are reviewed at length with the patient today.  Concerns regarding medicines are outlined above.   Tests Ordered: No orders of the defined types were placed in this encounter.   Medication Changes: No orders of the defined types were placed in this encounter.   Follow Up:  In Person  in one month with echo  Signed, Georgie Chard, NP  08/23/2023 2:17 PM    [facility]

## 2023-08-23 NOTE — Telephone Encounter (Signed)
  [facility]   MULTIDISCIPLINARY HEART VALVE TEAM   Attempted call the patient regarding discharge from [facility] on 08/22/2023 s/p mTEER with no answer. Left voice message to return call.    Georgie Chard NP-C Structural Heart Team  Pager: [phone]

## 2023-08-26 ENCOUNTER — Ambulatory Visit (INDEPENDENT_AMBULATORY_CARE_PROVIDER_SITE_OTHER): Payer: Medicare PPO | Admitting: Cardiology

## 2023-08-26 ENCOUNTER — Ambulatory Visit: Payer: Medicare PPO | Attending: Cardiology

## 2023-08-26 DIAGNOSIS — I340 Nonrheumatic mitral (valve) insufficiency: Secondary | ICD-10-CM | POA: Diagnosis not present

## 2023-08-26 DIAGNOSIS — Z95810 Presence of automatic (implantable) cardiac defibrillator: Secondary | ICD-10-CM

## 2023-08-26 DIAGNOSIS — I5022 Chronic systolic (congestive) heart failure: Secondary | ICD-10-CM | POA: Diagnosis not present

## 2023-08-26 DIAGNOSIS — Z95818 Presence of other cardiac implants and grafts: Secondary | ICD-10-CM

## 2023-08-26 DIAGNOSIS — I4819 Other persistent atrial fibrillation: Secondary | ICD-10-CM | POA: Diagnosis not present

## 2023-08-26 DIAGNOSIS — Z9889 Other specified postprocedural states: Secondary | ICD-10-CM

## 2023-08-26 DIAGNOSIS — I10 Essential (primary) hypertension: Secondary | ICD-10-CM | POA: Diagnosis not present

## 2023-08-26 DIAGNOSIS — N1831 Chronic kidney disease, stage 3a: Secondary | ICD-10-CM | POA: Diagnosis not present

## 2023-08-28 NOTE — Progress Notes (Signed)
Spoke with patient and advised to take Torsemide 4 tablets (80 mg total) daily x 2 days.  After 2nd day, returned prescribed prescription of 3 tablets (60 mg total) daily.   He verbalized understanding.   He has follow up appt with HF clinic on 12/6.

## 2023-08-28 NOTE — Progress Notes (Signed)
He can take torsemide 80 mg daily x 2 days then back to 60 mg daily.

## 2023-08-28 NOTE — Progress Notes (Signed)
EPIC Encounter for ICM Monitoring  Patient Name: Chad Brown is a 82 y.o. male Date: 08/28/2023 Primary Care Physican: Cleatis Polka., MD Primary Cardiologist: Nishan/McLean Electrophysiologist: Kathreen Cornfield Pacing: 99.7%   12/19/2022 Weight: 210-211 lbs 01/23/2023 Weight: 211.6 lbs 05/08/2023 Weight: 208 lbs 06/03/2023 Weight: 213 lbs 06/04/2023 Weight: 212 lbs (baseline 208-210 lbs)  06/14/2023 Weight: 204 lbs  06/24/2023 Weight: 214 lbs 07/12/2023 Weight: 210 lbs (baseline) 07/30/2023 Weight: 208 lbs 07/31/2023 Weight: 209 lbs 08/28/2023 Weight: 206.6 lbs    Clinical Status (23-Aug-2023 to 28-Aug-2023) Time in AT/AF  <0.1 hr/day (0.3%) Longest AT/AF  18 minutes (08/24/23)   Battery Longevity: Battery replacement scheduled for 12/16     Spoke with patient and heart failure questions reviewed.  Transmission results reviewed.  Pt asymptomatic for fluid accumulation, SOB has improved since mitral valve repair.  Weight has decreased by 2-3 pounds in the last few days.    Per hospital notes, Pt had mitral valve repair on 11/20 and converted to NSR.  Hospitalized 11/19-11/21.   Optivol thoracic impedance suggesting possible fluid accumulation starting 11/13 but trending back toward baseline.     Prescribed:  Torsemide 20 mg take 3 tablets (60 mg total) by mouth daily  Confirmed 11/27 he is taking prescribed dosage.   Labs: 08/22/2023 Creatinine 2.44, BUN 43, Potassium 4.6, Sodium 134, GFR 26  08/21/2023 Creatinine 2.37, BUN 41, Potassium 4.2, Sodium 140, GFR 27  08/19/2023 Creatinine 2.79, BUN 44, Potassium 3.8, Sodium 136, GFR 22  08/09/2023 Creatinine 2.62, BUN 44, Potassium 3.8, Sodium 139, GFR 24 08/08/2023 Creatinine 2.46, BUN 41, Potassium 3.5, Sodium 137, GFR 26  08/07/2023 Creatinine 2.61, BUN 39, Potassium 3.5, Sodium 138, GFR 24  08/06/2023 Creatinine 2.75, BUN 37, Potassium 3.9, Sodium 139, GFR 22 (2:56 PM) 08/06/2023 Creatinine 2.43, BUN 33, Potassium 3.3, Sodium  138, GFR 26 (4:57 AM) 08/05/2023 Creatinine 2.17, BUN 28, Potassium 3.3, Sodium 142, GFR 30 07/24/2023 Creatinine 2.63, BUN 38, Potassium 4.2, Sodium 139, GFR 24  07/17/2023 Creatinine 2.41, BUN 28, Potassium 4.7, Sodium 143, GFR 26  07/01/2023 Creatinine 2.61, BUN 34, Potassium 4.3, Sodium 139, GFR 24 06/21/2023 Creatinine 2.91, BUN 41, Potassium 5.1, Sodium 138, GFR 21 06/12/2023 Creatinine 3.31, BUN 49, Potassium 5.6, Sodium 134, GFR 18 (Potassium addressed by HF clinic) A complete set of results can be found in Results Review.   Recommendations:  Sent to Dr Shirlee Latch for review and recommendations if needed.     Follow-up plan: ICM clinic phone appointment on 09/09/2023 (manual) to recheck fluid levels.  91 day device clinic remote transmission pending battery replacement.     EP/Cardiology Next Office Visit:  09/06/2023 with HF clinic.     Copy of ICM check sent to Dr. Elberta Fortis.    3 month ICM trend: 08/28/2023.    12-14 Month ICM trend:     Karie Soda, RN 08/28/2023 7:30 AM

## 2023-09-02 ENCOUNTER — Other Ambulatory Visit: Payer: Self-pay | Admitting: Cardiology

## 2023-09-04 ENCOUNTER — Ambulatory Visit (HOSPITAL_BASED_OUTPATIENT_CLINIC_OR_DEPARTMENT_OTHER)
Admission: RE | Admit: 2023-09-04 | Discharge: 2023-09-04 | Disposition: A | Discharge status: Home / Self Care | Payer: Medicare PPO | Source: Ambulatory Visit | Attending: Acute Care | Admitting: Acute Care

## 2023-09-04 DIAGNOSIS — R918 Other nonspecific abnormal finding of lung field: Secondary | ICD-10-CM | POA: Insufficient documentation

## 2023-09-04 DIAGNOSIS — J069 Acute upper respiratory infection, unspecified: Secondary | ICD-10-CM | POA: Diagnosis not present

## 2023-09-04 NOTE — Progress Notes (Addendum)
Patient ID: Chad Brown, male   DOB: [birth year], 82 y.o.   MRN: [id number] PCP: Cleatis Polka., MD HF Cardiologist: Shirlee Latch  81 y.o. with paroxysmal atrial fibrillation and chronic systolic CHF thought to be due to nonischemic cardiomyopathy presents for followup of CHF. He has a cardiac history dating back to 1998, when he was admitted with chest pain and had cardiac cath showing nonobstructive CAD. In 4/14, he was found to be in atrial fibrillation.  Echo showed EF 30-35%.  He had DCCV back to NSR.  He was back in atrial fibrillation in 1/16, and EF was low again on TEE at that time.   Again, he had DCCV.  In 5/16, cardiac MRI showed persistently low EF, so given LBBB, he had Medtronic CRT-D device placed.  He was back in atrial fibrillation in 6/16 and had TEE-guided DCCV again with EF now 15-20% on TEE.  Repeat echo in 2/17 showed EF 55-60%. Echo in 1/19 showed EF 45-50%, mild LV and RV dilation.   Echo in 1/20 showed EF 45-50%, diffuse hypokinesis.   Atrial fibrillation ablation was done in 2/21.  Echo in 2/21 showed EF 40-45%, PASP 50 mmHg, mild MR. Echo in 6/22 also showed EF 40-45%.   Patient was admitted in 1/24 after a syncopal episode.  He had gone to the gym and exercised and was standing talking to someone when he got lightheaded and passed out.  No arrhythmia on device interrogation.  He was hypotensive and orthostatic in the ER. Coreg and Entresto were stopped and Lasix was decreased to 20 mg bid.    Spironolactone was stopped due to hyperkalemia.   Echo 10/24 EF 40-45%, mild LV dilation, mildly decreased RV systolic function, moderate-severe MR with splay artifact and PISA ERO 0.44 cm^2, PASP 71 mmHg, dilated IVC.    Admitted 11/4-11/8/24 for scheduled RHC/TEE, he was admitted for severe MR in the setting of low output and volume overload requiring Mirinone and lasic gtt. Seen by [facility] with plans to optimize and perform outpatient mTEER. Discharged on Torsemide 60 mg  daily.   Now s/p mTEER with clips x2 08/21/23 with Dr. Lynnette Caffey. He was discharged home on Xarelto and Amio increased to 200 bid for 1 wk for AF and resumed on Torsemide 60 mg daily.   Device interrogation 11/25 showed increased fluid impedence, he was instructed to take Torsemide 80 for 2 days.  Today he returns for HF follow up with his wife. Overall feeling fine. No SOB at home performing ADLs, although does experience intermittent fatigue. Endorses persistent dry, nagging cough that is worse at night. No CP, dizziness, edema. Able to sleep supine on 1 pillow. Appetite ok. No fever or chills. Weight at home 205 pounds. Taking all medications.   Plan generator change out 12/16 with Dr. Elberta Fortis   Medtronic device interrogation today: Optivol up, thoracic impendence down, 1.2h/day activity, no VT, short burst of AF, >99% VP. (personally reviewed)  ECG (personally reviewed): A-BiV pacing at 80 bpm  Labs (10/19): K 4.7, creatinine 1.66 => 1.58, LFTs normal Labs (1/20): LDL 128 Labs (11/20): K 3.6, creatinine 1.62 Labs (1/21): K 3.8, creatinine 1.89, BNP 578, LDL 115, TSH normal, LFTs normal Labs (3/21): K 4.7, creatinine 2.12 Labs (1/22): K 3.3, creatinine 1.34 Labs (5/22): LDL 42 Labs (8/22): K 3.8, creatinine 1.45 Labs (1/24): K 4, creatinine 2.3 Labs (2/24): K 5.1, creatinine 2.23, LDL 48 Labs (10/24): K 4.3, creatinine 2.61 Labs (11/24): K 4.6, creatinine 2.44  PMH: 1. Atrial fibrillation: Paroxysmal.  Diagnosed 4/14, DCCV 4/14 to NSR.  DCCV 1/16 to NSR.  DCCV 6/16 to NSR.  2. Chronic systolic CHF: Nonischemic cardiomyopathy.  LHC in 1998 with nonobstructive disease.  Cardiolite in 6/14 with no ischemia or infarction.  cMRI (5/16) with EF 34%, mildly dilated LV with diffuse HK worse in anterolateral wall, small punctate areas of LGE in anteroseptum and basal inferior wall (not CAD pattern).  TEE (6/16) with EF 15-20%.  He has Medtronic CRT-D device.  Echo (9/16) with EF 40%, moderate  LV dilation, grade II diastolic dysfunction, normal RV size and systolic function, PASP 42 mmHg.  - Hypotension with Entresto.  - Echo (2/17) showed LV functional recovery, EF 55-60% with mild MR.    - Echo (1/19): EF 45-50%, mild LV dilation, mild RV dilation - Echo (1/20): EF 45-50%, diffuse hypokinesis, moderate diastolic dysfunction, normal RV size and systolic function.  - Echo (2/21): EF 40-45%, PASP 50 mmHg, mild MR.  - Echo (6/22): EF 40-45%, moderate LV dilation, mild LVH, RV normal - Echo (10/24): EF 40-45%, mild LV dilation, mildly decreased RV systolic function, moderate-severe MR with splay artifact and PISA ERO 0.44 cm^2, PASP 71 mmHg, dilated IVC.   - Echo (11/24): EF 45-50%, gHK. RV function normal, mildly enlarged, RVSP 52.8, severely dilated LA and RA. Mild MR with replacement, ~RA3 3. Hyperlipidemia: Myalgias with atorvastatin, Livalo, and pravastatin.  4. CKD stage 3 5. OA: s/p THR.  6. H/o melanoma 7. Anemia 8. Diverticulosis 9. OSA: Uses CPAP.  10. Pulmonary nodules: Followed by Dr. Ellin Saba.  11. Pancreatic pseudocysts 12. PAD: ABIs (2/20) were 0.94 on right and 1.03 on left.  13. Papillary thyroid carcinoma: s/p thyroidectomy 8/22.  14. Mitral regurgitation: At lease moderately severe on 10/24 echo.   SH: Married with 3 children, lives in [place], retired, quit smoking in 2001.    FH: Mother with MI  ROS: All systems reviewed and negative except as per HPI.  Current Outpatient Medications  Medication Sig Dispense Refill   cholecalciferol (VITAMIN D3) 25 MCG (1000 UNIT) tablet Take 1,000 Units by mouth in the morning.     Cyanocobalamin (VITAMIN B 12 PO) Take 500 mcg by mouth in the morning.     dorzolamide-timolol (COSOPT) 22.3-6.8 MG/ML ophthalmic solution Place 1 drop into the right eye 2 (two) times daily.     empagliflozin (JARDIANCE) 10 MG TABS tablet Take 1 tablet (10 mg total) by mouth daily. 30 tablet 11   escitalopram (LEXAPRO) 20 MG tablet  Take 1 tablet (20 mg total) by mouth daily. (Patient taking differently: Take 20 mg by mouth daily in the afternoon.) 90 tablet 0   Evolocumab (REPATHA SURECLICK) 140 MG/ML SOAJ INJECT 140MG  ( ) INTO SKIN EVERY 2 WEEKS 6 mL 3   Ferrous Sulfate (IRON PO) Take 9 mg by mouth in the morning. Chewable     finasteride (PROSCAR) 5 MG tablet Take 1 tablet (5 mg total) by mouth daily. (Patient taking differently: Take 5 mg by mouth every evening.) 30 tablet 2   FOLIC ACID PO Take 1 tablet by mouth daily.     gabapentin (NEURONTIN) 100 MG capsule Take 200 mg by mouth 2 (two) times daily.      latanoprost (XALATAN) 0.005 % ophthalmic solution Place 1 drop into the right eye daily at 12 noon.     levothyroxine (SYNTHROID) 137 MCG tablet TAKE ONE TABLET DAILY BEFORE BREAKFAST. must make appointment FOR refills 90 tablet 1  Polyethyl Glycol-Propyl Glycol 0.4-0.3 % SOLN Place 1 drop into the left eye in the morning and at bedtime.     potassium chloride SA (KLOR-CON M) 20 MEQ tablet Take 1 tablet (20 mEq total) by mouth daily. 30 tablet 6   RHOPRESSA 0.02 % SOLN Place 1 drop into the right eye at bedtime.     tamsulosin (FLOMAX) 0.4 MG CAPS Take 0.4 mg by mouth at bedtime.      XARELTO 15 MG TABS tablet TAKE ONE TABLET DAILY WITH SUPPER 90 tablet 1   amiodarone (PACERONE) 200 MG tablet Take 1 tablet (200 mg total) by mouth daily for 7 days.     torsemide (DEMADEX) 20 MG tablet Take 3 tablets (60 mg total) by mouth every morning AND 2 tablets (40 mg total) every evening. 150 tablet 6   No current facility-administered medications for this encounter.   Wt Readings from Last 3 Encounters:  09/06/23 95.7 kg (210 lb 15.7 oz)  09/06/23 95.7 kg (211 lb)  08/22/23 95.9 kg (211 lb 6.7 oz)   BP 124/86   Pulse 64   Wt 95.7 kg (211 lb)   SpO2 95%   BMI 34.06 kg/m   Physical Exam: General:  Well appearing. No resp difficulty HEENT: normal Neck: supple. JVP 8cm. Carotids 2+ bilat; no bruits. No  lymphadenopathy or thryomegaly appreciated. Cor: PMI nondisplaced. Regular rate & rhythm. No rubs, gallops or murmurs. Lungs: bases diminished Abdomen: soft, nontender, nondistended. No hepatosplenomegaly. No bruits or masses. Good bowel sounds. Extremities: no cyanosis, clubbing, rash, edema Neuro: alert & orientedx3, cranial nerves grossly intact. moves all 4 extremities w/o difficulty. Affect pleasant  Assessment/Plan: 1. Chronic systolic CHF: Suspect nonischemic cardiomyopathy.  EF 15-20% on 6/16 TEE.  Cardiac MRI in 5/16 showed a LGE pattern that was not suggestive of coronary disease (?myocarditis or infiltrative disease).  There may be a component of tachycardia-mediated cardiomyopathy.  However, the cardiac MRI appears to have been done when he was in NSR. He has Medtronic CRT-D device.  Echo (6/22) with EF 40-45%.  Echo (10/24) showed EF 40-45%, mild LV dilation, mildly decreased RV systolic function, moderate-severe MR with splay artifact and PISA ERO 0.44 cm^2, PASP 71 mmHg, dilated IVC. RHC 11/4 with significant mitral regurg, elevated filling pressures and low output, and severe mixed pulm venous and arterial hypertension. Mitral regurgitation may be leading to worsening CHF. Now s/p mTEER with mitral clip x2 11/24.  - NYHA class II symptom   - GDMT has been limited in past by orthostatic symptoms and CKD.  - Increase Torsemide to 60 q am and 40 q pm. Start Potassium 20 mEq daily. BNP/BMET today, follow labs in a week.  - Continue Coreg at low dose 3.125 mg bid.  - Off spironolactone with hyperkalemia.  - Continue Jardiance 10 mg daily.  - He will stay off Entresto with worsening renal function.  2. Atrial fibrillation: Paroxysmal. He had increased AF burden and was started on amiodarone.  100% BiV paced. - Continue Xarelto 15 mg daily.  - Amio 200 daily for 7 days starting today per EP  3. Hyperlipidemia: Continue Repatha. 4. OSA: Encouraged compliance with CPAP.   5. PAD: Minimal  claudication.  Continue Repatha.  6. CKD: Stage 3.  Follows with nephrology in [place].  - Continue SGLT2i.  7. Mitral regurgitation: s/p mTEER and clips x2. F/u Echo in 1 week with [facility]  Echo and follow-up with [facility] 09/13/23 Plan generator change out 12/16 with  Dr. Elberta Fortis, will follow labs at this time.  Follow up: 3 weeks with [facility].  [place] Emmilyn Crooke, NP 09/06/2023

## 2023-09-06 ENCOUNTER — Encounter (HOSPITAL_COMMUNITY): Payer: Self-pay

## 2023-09-06 ENCOUNTER — Encounter (HOSPITAL_COMMUNITY): Payer: Self-pay | Admitting: Internal Medicine

## 2023-09-06 ENCOUNTER — Ambulatory Visit (HOSPITAL_COMMUNITY)
Admission: RE | Admit: 2023-09-06 | Discharge: 2023-09-06 | Disposition: A | Discharge status: Home / Self Care | Payer: Medicare PPO | Source: Ambulatory Visit | Attending: Family Medicine | Admitting: Family Medicine

## 2023-09-06 ENCOUNTER — Ambulatory Visit (HOSPITAL_COMMUNITY)
Admission: RE | Admit: 2023-09-06 | Discharge: 2023-09-06 | Disposition: A | Discharge status: Home / Self Care | Payer: Medicare PPO | Source: Ambulatory Visit | Attending: Physician Assistant | Admitting: Internal Medicine

## 2023-09-06 VITALS — BP 124/86 | HR 64 | Wt 211.0 lb

## 2023-09-06 VITALS — BP 124/86 | HR 64 | Ht 66.0 in | Wt 211.0 lb

## 2023-09-06 DIAGNOSIS — Z7901 Long term (current) use of anticoagulants: Secondary | ICD-10-CM | POA: Insufficient documentation

## 2023-09-06 DIAGNOSIS — I480 Paroxysmal atrial fibrillation: Secondary | ICD-10-CM | POA: Diagnosis not present

## 2023-09-06 DIAGNOSIS — G4733 Obstructive sleep apnea (adult) (pediatric): Secondary | ICD-10-CM

## 2023-09-06 DIAGNOSIS — I739 Peripheral vascular disease, unspecified: Secondary | ICD-10-CM | POA: Diagnosis not present

## 2023-09-06 DIAGNOSIS — Z7182 Exercise counseling: Secondary | ICD-10-CM | POA: Insufficient documentation

## 2023-09-06 DIAGNOSIS — I340 Nonrheumatic mitral (valve) insufficiency: Secondary | ICD-10-CM | POA: Diagnosis not present

## 2023-09-06 DIAGNOSIS — E669 Obesity, unspecified: Secondary | ICD-10-CM | POA: Diagnosis not present

## 2023-09-06 DIAGNOSIS — D6869 Other thrombophilia: Secondary | ICD-10-CM | POA: Diagnosis not present

## 2023-09-06 DIAGNOSIS — Z79899 Other long term (current) drug therapy: Secondary | ICD-10-CM | POA: Diagnosis not present

## 2023-09-06 DIAGNOSIS — Z5181 Encounter for therapeutic drug level monitoring: Secondary | ICD-10-CM

## 2023-09-06 DIAGNOSIS — I5022 Chronic systolic (congestive) heart failure: Secondary | ICD-10-CM | POA: Diagnosis not present

## 2023-09-06 DIAGNOSIS — Z6834 Body mass index (BMI) 34.0-34.9, adult: Secondary | ICD-10-CM | POA: Diagnosis not present

## 2023-09-06 DIAGNOSIS — E785 Hyperlipidemia, unspecified: Secondary | ICD-10-CM

## 2023-09-06 DIAGNOSIS — I4819 Other persistent atrial fibrillation: Secondary | ICD-10-CM | POA: Insufficient documentation

## 2023-09-06 DIAGNOSIS — N183 Chronic kidney disease, stage 3 unspecified: Secondary | ICD-10-CM

## 2023-09-06 DIAGNOSIS — I130 Hypertensive heart and chronic kidney disease with heart failure and stage 1 through stage 4 chronic kidney disease, or unspecified chronic kidney disease: Secondary | ICD-10-CM | POA: Insufficient documentation

## 2023-09-06 LAB — BASIC METABOLIC PANEL
Anion gap: 9 (ref 5–15)
BUN: 36 mg/dL — ABNORMAL HIGH (ref 8–23)
CO2: 28 mmol/L (ref 22–32)
Calcium: 8.1 mg/dL — ABNORMAL LOW (ref 8.9–10.3)
Chloride: 106 mmol/L (ref 98–111)
Creatinine, Ser: 2.25 mg/dL — ABNORMAL HIGH (ref 0.61–1.24)
GFR, Estimated: 28 mL/min — ABNORMAL LOW (ref >60)
Glucose, Bld: 102 mg/dL — ABNORMAL HIGH (ref 70–99)
Potassium: 4.4 mmol/L (ref 3.5–5.1)
Sodium: 143 mmol/L (ref 135–145)

## 2023-09-06 LAB — BRAIN NATRIURETIC PEPTIDE: B Natriuretic Peptide: 294.8 pg/mL — ABNORMAL HIGH (ref 0.0–100.0)

## 2023-09-06 MED ORDER — TORSEMIDE 20 MG PO TABS: ORAL_TABLET | ORAL | 6 refills | Status: DC

## 2023-09-06 MED ORDER — POTASSIUM CHLORIDE CRYS ER 20 MEQ PO TBCR: 20.0000 meq | EXTENDED_RELEASE_TABLET | Freq: Every day | ORAL | 6 refills | Status: DC

## 2023-09-06 MED ORDER — AMIODARONE HCL 200 MG PO TABS: 200.0000 mg | ORAL_TABLET | Freq: Every day | ORAL | Status: DC

## 2023-09-06 NOTE — Progress Notes (Signed)
Primary Care Physician: Cleatis Polka., MD Primary Cardiologist: [facility]: Dr Shirlee Latch Primary Electrophysiologist: Dr Elberta Fortis Referring Physician: Dr Christie Nottingham is a 82 y.o. male with a history of persistent atrial fibrillation, systolic CHF, CKD, OSA, HLD, who presents for follow up in the [facility].  The patient was initially diagnosed with atrial fibrillation 12/2012. Patient is on Xarelto for a CHADS2VASC score of 4. Echo showed EF 30-35%.  He had DCCV back to NSR.  He was back in atrial fibrillation in 1/16, and EF was low again on TEE at that time and he had DCCV.  In 5/16, cardiac MRI showed persistently low EF, so given LBBB, he had [employer] CRT-D device placed.  He was back in atrial fibrillation in 6/16 and had TEE-guided DCCV. Patient noted to be back in afib 10/2019 despite amiodarone therapy. He underwent afib ablation with Dr Johney Frame on 11/03/19. Patient reports that he has done well since the procedure. His weight has been stable and no lower extremity edema. Patient does report that he fell about 3 weeks ago and hit the side of his face on the trash can. He did not seek evaluation for this. No presyncope, loss of consciousness, or residual symptoms. He denies chest pain, swallowing, or groin issues.  On follow up 5/8, he is currently in SR today. Recent review of device on 4/29 showed 30 episodes of AF with 6% burden. Patient notes that when he was in episodes would feel anxious, weak, and dizzy. No shortness of breath or chest pain with episodes.   On follow up 09/06/23, he is currently in AV paced rhythm. Admission for diuresis/volume optimization secondary to acute on chronic HF with reduced EF 11/4-8/24. Admission 11/19-21/24 for severe mitral regurgitation s/p transcatheter edge to edge mitral valve repair. He was noted to be in rapid Afib at admission and started on IV amiodarone. Discharged on amiodarone 200 mg TID then to 200 mg  BID. He has noted no episodes of Afib since hospital discharge on 11/21. He feels better since mitral valve repair, notes to be a little less SOB, and has a little bit more energy. He is currently taking amiodarone 200 mg BID. No missed doses of Xarelto 15 mg daily. He shows me a log of daily weights and for December has been in between 204.4 and 205.6 lbs (today 205 lbs).   Today, he denies symptoms of palpitations, orthopnea, PND, lower extremity edema, presyncope, syncope, snoring, daytime somnolence, bleeding, or neurologic sequela. The patient is tolerating medications without difficulties and is otherwise without complaint today.    Atrial Fibrillation Risk Factors:  he does have symptoms or diagnosis of sleep apnea. he is compliant with CPAP therapy. he does not have a history of rheumatic fever.  he has a BMI of Body mass index is 34.05 kg/m.[name] Filed Weights   09/06/23 1041  Weight: 95.7 kg     Family History  Problem Relation Age of Onset   Kidney disease Mother    Heart disease Mother        MI, open heart   Diabetes Mother        dialysis   Leukemia Father    Colon cancer Paternal Uncle    Lung cancer Paternal Uncle        x 2   Prostate cancer Paternal Uncle    Diabetes Maternal Grandmother    Heart attack Maternal Uncle    Diabetes Maternal Aunt  x 3   Diabetes Maternal Uncle    Thyroid disease Sister     Atrial Fibrillation Management history:  Previous antiarrhythmic drugs: amiodarone Previous cardioversions: several, most recently 2018 Previous ablations: 11/03/19 CHADS2VASC score: 4 Anticoagulation history: Xarelto   Past Medical History:  Diagnosis Date   Adenomatous colon polyp    tubular   AICD (automatic cardioverter/defibrillator) present 03/08/2015   MDT CRTD dual pacemaker and defib   Anemia    iron deficient   Arthritis    "about all my joints; hands, knees, back" (03/08/2015)   Atherosclerosis    Cataract    left eye small    Cholelithiasis    gallstones   Chronic systolic CHF (congestive heart failure) (HCC)    a. New dx 12/2012 ? NICM, may be r/t afib. b. Nuc 03/2013 - normal;  c. 03/2015 TEE EF 15-20%.   Colon polyp, hyperplastic 01/2015   removed precancerous lesions   Depression    Diverticulosis    Dysrhythmia    afib   GERD (gastroesophageal reflux disease)    Glaucoma    right eye   Hyperlipidemia    Hypertension    Melanoma of eye (HCC) 2000's   "right; it's never been biopsied"   Melanoma of lower leg (HCC) 2015   "left; right at my knee"   Myocardial infarction (HCC) 1998   OSA (obstructive sleep apnea) 01/04/2016   no longer tolerates cpap   Peripheral vision loss, right 2006   Persistent atrial fibrillation (HCC)    a. Dx 12/2012, s/p TEE/DCCV 01/26/13. b. On Xarelto (CHA2DS2VASc = 3);  c. 03/2015 TEE (EF 15-20%, no LAA thrombus) and DCCV - amio increased to 200 mg bid.   Pneumonia    S/P mitral valve clip implantation 08/21/2023   Placement of 1 XTW in the A2/P2 position and 1 NTW in the A1/P1 position by Dr. Lynnette Caffey and Dr. Excell Seltzer   Urinary hesitancy due to benign prostatic hypertrophy    Past Surgical History:  Procedure Laterality Date   ATRIAL FIBRILLATION ABLATION N/A 11/03/2019   Procedure: ATRIAL FIBRILLATION ABLATION;  Surgeon: Hillis Range, MD;  Location: [facility] INVASIVE CV LAB;  Service: Cardiovascular;  Laterality: N/A;   BACK SURGERY     upper back, cannot turn neck well   CARDIAC CATHETERIZATION  1998   CARDIOVERSION N/A 01/26/2013   Procedure: CARDIOVERSION;  Surgeon: Lewayne Bunting, MD;  Location: [facility] ENDOSCOPY;  Service: Cardiovascular;  Laterality: N/A;   CARDIOVERSION N/A 03/23/2015   Procedure: CARDIOVERSION;  Surgeon: Jake Bathe, MD;  Location: [facility] ENDOSCOPY;  Service: Cardiovascular;  Laterality: N/A;   CARDIOVERSION N/A 08/14/2017   Procedure: CARDIOVERSION;  Surgeon: Wendall Stade, MD;  Location[facility] ENDOSCOPY;  Service: Cardiovascular;  Laterality: N/A;   CATARACT  EXTRACTION Right ~ 2006   COLONOSCOPY WITH PROPOFOL N/A 02/10/2015   Procedure: COLONOSCOPY WITH PROPOFOL;  Surgeon: Iva Boop, MD;  Location: [facility] ENDOSCOPY;  Service: Endoscopy;  Laterality: N/A;   COLONOSCOPY WITH PROPOFOL N/A 08/07/2016   Procedure: COLONOSCOPY WITH PROPOFOL;  Surgeon: Iva Boop, MD;  Location: [facility] ENDOSCOPY;  Service: Endoscopy;  Laterality: N/A;   ENTEROSCOPY N/A 08/17/2015   Procedure: ENTEROSCOPY;  Surgeon: Iva Boop, MD;  Location: [facility] ENDOSCOPY;  Service: Endoscopy;  Laterality: N/A;   EP IMPLANTABLE DEVICE N/A 03/08/2015   MDT Ovidio Kin CRT-D for nonischemic CM by Dr Johney Frame for primary prevention   GLAUCOMA SURGERY Right ~ 2006   "put 3 stents in to drain fluid" (  03/08/2015) not successful, sent to [facility] to try to get last stent out   HOT HEMOSTASIS N/A 08/07/2016   Procedure: HOT HEMOSTASIS (ARGON PLASMA COAGULATION/BICAP);  Surgeon: Iva Boop, MD;  Location: [facility] ENDOSCOPY;  Service: Endoscopy;  Laterality: N/A;   INCISION AND DRAINAGE ABSCESS POSTERIOR CERVICALSPINE  05/2012   JOINT REPLACEMENT     MELANOMA EXCISION Left 2015   "lower leg; right at my knee"   REFRACTIVE SURGERY Right ~ 2006 X 2   "twice; both done at [facility]" (03/08/2015   RIGHT HEART CATH N/A 08/05/2023   Procedure: RIGHT HEART CATH;  Surgeon: Laurey Morale, MD;  Location: [facility] INVASIVE CV LAB;  Service: Cardiovascular;  Laterality: N/A;   SURGERY SCROTAL / TESTICULAR Right 1990's   TEE WITHOUT CARDIOVERSION N/A 01/26/2013   Procedure: TRANSESOPHAGEAL ECHOCARDIOGRAM (TEE);  Surgeon: Lewayne Bunting, MD;  Location: [facility] ENDOSCOPY;  Service: Cardiovascular;  Laterality: N/A;  [name] anes. /    TEE WITHOUT CARDIOVERSION N/A 10/05/2014   Procedure: TRANSESOPHAGEAL ECHOCARDIOGRAM (TEE)  with cardioversion;  Surgeon: Vesta Mixer, MD;  Location: [facility] ENDOSCOPY;  Service: Cardiovascular;  Laterality: N/A;  12:52 synched cardioversion at 120 joules,...afib to SR...12 lead EKG ordered.Marland KitchenMarland KitchenCardiozem d/c'ed  per MD verbal order at SR   TEE WITHOUT CARDIOVERSION N/A 03/23/2015   Procedure: TRANSESOPHAGEAL ECHOCARDIOGRAM (TEE);  Surgeon: Jake Bathe, MD;  Location: [facility] ENDOSCOPY;  Service: Cardiovascular;  Laterality: N/A;   TEE WITHOUT CARDIOVERSION N/A 11/02/2019   Procedure: TRANSESOPHAGEAL ECHOCARDIOGRAM (TEE);  Surgeon: Wendall Stade, MD;  Location: [facility] ENDOSCOPY;  Service: Cardiovascular;  Laterality: N/A;   TEE WITHOUT CARDIOVERSION N/A 08/05/2023   Procedure: TRANSESOPHAGEAL ECHOCARDIOGRAM;  Surgeon: Laurey Morale, MD;  Location: [facility] INVASIVE CV LAB;  Service: Cardiovascular;  Laterality: N/A;   THORACIC SPINE SURGERY  03/2000   "ground calcium deposits from upper thoracic" (01/26/2013)   THYROIDECTOMY N/A 05/11/2021   Procedure: TOTAL THYROIDECTOMY;  Surgeon: Darnell Level, MD;  Location: [facility] ORS;  Service: General;  Laterality: N/A;   TOTAL HIP ARTHROPLASTY Right 06/2007   TRANSCATHETER MITRAL EDGE TO EDGE REPAIR N/A 08/21/2023   Procedure: TRANSCATHETER MITRAL EDGE TO EDGE REPAIR;  Surgeon: [name], MD;  Location: [facility] INVASIVE CV LAB;  Service: Cardiovascular;  Laterality: N/A;   TRANSESOPHAGEAL ECHOCARDIOGRAM (CATH LAB) N/A 08/09/2023   Procedure: TRANSESOPHAGEAL ECHOCARDIOGRAM;  Surgeon: Laurey Morale, MD;  Location: [facility] INVASIVE CV LAB;  Service: Cardiovascular;  Laterality: N/A;   TRANSESOPHAGEAL ECHOCARDIOGRAM (CATH LAB) N/A 08/21/2023   Procedure: TRANSESOPHAGEAL ECHOCARDIOGRAM;  Surgeon: [name], MD;  Location: [facility] INVASIVE CV LAB;  Service: Cardiovascular;  Laterality: N/A;    Current Outpatient Medications  Medication Sig Dispense Refill   cholecalciferol (VITAMIN D3) 25 MCG (1000 UNIT) tablet Take 1,000 Units by mouth in the morning.     Cyanocobalamin (VITAMIN B 12 PO) Take 500 mcg by mouth in the morning.     dorzolamide-timolol (COSOPT) 22.3-6.8 MG/ML ophthalmic solution Place 1 drop into the right eye 2 (two) times daily.     empagliflozin (JARDIANCE) 10 MG TABS  tablet Take 1 tablet (10 mg total) by mouth daily. 30 tablet 11   escitalopram (LEXAPRO) 20 MG tablet Take 1 tablet (20 mg total) by mouth daily. (Patient taking differently: Take 20 mg by mouth daily in the afternoon.) 90 tablet 0   Evolocumab (REPATHA SURECLICK) 140 MG/ML SOAJ INJECT 140MG  ( ) INTO SKIN EVERY 2 WEEKS 6 mL 3   Ferrous Sulfate (IRON PO) Take 9 mg by  mouth in the morning. Chewable     finasteride (PROSCAR) 5 MG tablet Take 1 tablet (5 mg total) by mouth daily. (Patient taking differently: Take 5 mg by mouth every evening.) 30 tablet 2   FOLIC ACID PO Take 1 tablet by mouth daily.     gabapentin (NEURONTIN) 100 MG capsule Take 200 mg by mouth 2 (two) times daily.      latanoprost (XALATAN) 0.005 % ophthalmic solution Place 1 drop into the right eye daily at 12 noon.     levothyroxine (SYNTHROID) 137 MCG tablet TAKE ONE TABLET DAILY BEFORE BREAKFAST. must make appointment FOR refills 90 tablet 1   Polyethyl Glycol-Propyl Glycol 0.4-0.3 % SOLN Place 1 drop into the left eye in the morning and at bedtime.     potassium chloride SA (KLOR-CON M) 20 MEQ tablet Take 1 tablet (20 mEq total) by mouth daily. 30 tablet 6   RHOPRESSA 0.02 % SOLN Place 1 drop into the right eye at bedtime.     tamsulosin (FLOMAX) 0.4 MG CAPS Take 0.4 mg by mouth at bedtime.      torsemide (DEMADEX) 20 MG tablet Take 3 tablets (60 mg total) by mouth every morning AND 2 tablets (40 mg total) every evening. 150 tablet 6   XARELTO 15 MG TABS tablet TAKE ONE TABLET DAILY WITH SUPPER 90 tablet 1   amiodarone (PACERONE) 200 MG tablet Take 1 tablet (200 mg total) by mouth daily for 7 days.     No current facility-administered medications for this encounter.    Allergies  Allergen Reactions   Pravastatin Sodium Other (See Comments)    Joint and muscle pain   Azithromycin Diarrhea    ROS- All systems are reviewed and negative except as per the HPI above.  Physical Exam: Vitals:   09/06/23 1041  BP: 124/86   Pulse: 64  SpO2: 95%  Weight: 95.7 kg  Height: 5\' 6"  (1.676 m)    GEN- The patient is well appearing, alert and oriented x 3 today.   Neck - no JVD or carotid bruit noted Lungs- Clear to ausculation bilaterally, normal work of breathing Heart- Regular rate and rhythm, no murmurs, rubs or gallops, PMI not laterally displaced Extremities- no clubbing, cyanosis, or edema Skin - no rash or ecchymosis noted   Wt Readings from Last 3 Encounters:  09/06/23 95.7 kg  09/06/23 95.7 kg  08/22/23 95.9 kg    EKG today demonstrates Vent. rate 80 BPM PR interval * ms QRS duration 190 ms QT/QTcB 500/576 ms P-R-T axes * 185 -1 AV dual-paced rhythm Abnormal ECG When compared with ECG of 22-Aug-2023 06:49, Vent. rate has increased BY 16 BPM  Echo 08/22/23: 1. Left ventricular ejection fraction, by estimation, is 45 to 50%. The  left ventricle has mildly decreased function. The left ventricle  demonstrates global hypokinesis. There is mild concentric left ventricular  hypertrophy. Left ventricular diastolic  function could not be evaluated.   2. Right ventricular systolic function is normal. The right ventricular  size is mildly enlarged. There is moderately elevated pulmonary artery  systolic pressure. The estimated right ventricular systolic pressure is  52.8 mmHg.   3. Left atrial size was severely dilated.   4. Right atrial size was severely dilated.   5. The mitral valve has been repaired/replaced. There is a Mitra-Clip  present in the mitral position x 2      Mild mitral valve regurgitation. No evidence of mitral stenosis. The  mean mitral valve gradient is  4.0 mmHg.   6. The aortic valve is tricuspid. Aortic valve regurgitation is trivial.  Aortic valve sclerosis is present, with no evidence of aortic valve  stenosis.   7. The inferior vena cava is normal in size with greater than 50%  respiratory variability, suggesting right atrial pressure of 3 mmHg.   Epic records are  reviewed at length today  CHA2DS2-VASc Score =  4 The patient's score is based upon:        ASSESSMENT AND PLAN: 1. Persistent Atrial Fibrillation (ICD10:  I48.19) The patient's CHA2DS2-VASc score is 4 , indicating a  4.0% annual risk of stroke.   S/p afib ablation 11/03/19.  Patient is currently in AV paced rhythm. He is doing overall well since mitral valve repair.  Transition to amiodarone 200 mg once daily.    2. Secondary Hypercoagulable State (ICD10:  D68.69) The patient is at significant risk for stroke/thromboembolism based upon his CHA2DS2-VASc Score of 4 .  Continue Rivaroxaban (Xarelto).  No missed doses  3. Obesity Body mass index is 34.05 kg/m. Lifestyle modification was discussed at length including regular exercise and weight reduction. Encouraged daily walking as tolerated.  4. Obstructive sleep apnea The importance of adequate treatment of sleep apnea was discussed today in order to improve our ability to maintain sinus rhythm long term. Patient reports compliance with CPAP therapy.  5. Chronic systolic CHF No signs or symptoms of fluid overload today. Weight stable.   Follow up with Dr. Elberta Fortis as scheduled for generator change.    Lake Bells, PA-C Afib Clinic [facility] [address] [place], [place] [address] [phone] 09/06/2023 11:00 AM

## 2023-09-06 NOTE — Addendum Note (Signed)
Encounter addended by: Bernardina Cacho, [place], NP on: 09/06/2023 1:20 PM  Actions taken: Clinical Note Signed

## 2023-09-06 NOTE — Patient Instructions (Signed)
Decrease Amiodarone to 200mg once a day 

## 2023-09-06 NOTE — Patient Instructions (Signed)
Thank you for coming in today  If you had labs drawn today, any labs that are abnormal the clinic will call you No news is good news  Medications: Increase Torsemide to 60 mg every morning and 40 mg every evening Start Potassium 20 meq daily  Follow up appointments:  Your physician recommends that you schedule a follow-up appointment in:  2-3 weeks in clinic   Do the following things EVERYDAY: Weigh yourself in the morning before breakfast. Write it down and keep it in a log. Take your medicines as prescribed Eat low salt foods--Limit salt (sodium) to 2000 mg per day.  Stay as active as you can everyday Limit all fluids for the day to less than 2 liters   At the [facility], you and your health needs are our priority. As part of our continuing mission to provide you with exceptional heart care, we have created designated Provider Care Teams. These Care Teams include your primary Cardiologist (physician) and Advanced Practice Providers (APPs- Physician Assistants and Nurse Practitioners) who all work together to provide you with the care you need, when you need it.   You may see any of the following providers on your designated Care Team at your next follow up: Dr Arvilla Meres Dr Marca Ancona Dr. Marcos Eke, NP Robbie Lis, [place] [facility] Strong, [place] Brynda Peon, NP Karle Plumber, PharmD   Please be sure to bring in all your medications bottles to every appointment.    Thank you for choosing [facility]  If you have any questions or concerns before your next appointment please send [place] a message through [place] or call our office at [phone].    TO LEAVE A MESSAGE FOR THE NURSE SELECT OPTION 2, PLEASE LEAVE A MESSAGE INCLUDING: YOUR NAME DATE OF BIRTH CALL BACK NUMBER REASON FOR CALL**this is important as we prioritize the call backs  YOU WILL RECEIVE A CALL BACK THE SAME  DAY AS LONG AS YOU CALL BEFORE 4:00 PM

## 2023-09-11 ENCOUNTER — Telehealth (HOSPITAL_COMMUNITY): Payer: Self-pay | Admitting: Radiology

## 2023-09-11 ENCOUNTER — Telehealth: Payer: Self-pay

## 2023-09-11 ENCOUNTER — Ambulatory Visit: Payer: Medicare PPO | Attending: Cardiology

## 2023-09-11 DIAGNOSIS — Z95810 Presence of automatic (implantable) cardiac defibrillator: Secondary | ICD-10-CM | POA: Diagnosis not present

## 2023-09-11 DIAGNOSIS — I5022 Chronic systolic (congestive) heart failure: Secondary | ICD-10-CM

## 2023-09-11 MED ORDER — TORSEMIDE 20 MG PO TABS: 60.0000 mg | ORAL_TABLET | Freq: Two times a day (BID) | ORAL | 6 refills | Status: DC

## 2023-09-11 NOTE — Telephone Encounter (Addendum)
Spoke with patient.  Advised HF clinic would like a device report today to recheck fluid levels after OV last week.   Pt said he was not feeling well yesterday  He felt overall weakness, trembling, coughing vomited yesterday morning and chest congestion.  Today he says he feels like he is back to normal. Reports weight has decreased to 201 lbs.  Advised to send manual remote transmission for review to Chad Brown with HF clinic and also inform her of symptoms he experienced yesterday, 12/11.  See ICM note for recommendations and review.

## 2023-09-11 NOTE — Progress Notes (Signed)
Changes addressed by Dr Shirlee Latch in 12/11 phone note and patient was notified by HF clinic of changes.

## 2023-09-11 NOTE — Progress Notes (Signed)
EPIC Encounter for ICM Monitoring  Patient Name: Chad Brown is a 82 y.o. male Date: 09/11/2023 Primary Care Physican: Cleatis Polka., MD Primary Cardiologist: Nishan/McLean Electrophysiologist: Kathreen Cornfield Pacing: 99.9%   12/19/2022 Weight: 210-211 lbs 01/23/2023 Weight: 211.6 lbs 05/08/2023 Weight: 208 lbs 06/03/2023 Weight: 213 lbs 06/04/2023 Weight: 212 lbs (baseline 208-210 lbs)  06/14/2023 Weight: 204 lbs  06/24/2023 Weight: 214 lbs 07/12/2023 Weight: 210 lbs (baseline) 07/30/2023 Weight: 208 lbs 07/31/2023 Weight: 209 lbs 08/28/2023 Weight: 206.6 lbs 09/11/2023 Weight: 201 lbs    Clinical Status (23-Aug-2023 to 28-Aug-2023) Time in AT/AF  <0.1 hr/day (0.3%) Longest AT/AF  18 minutes (08/24/23)   Battery Longevity: Battery replacement scheduled for 12/16     Spoke with patient and heart failure questions reviewed.  Transmission results reviewed.  Pt reports yesterday, 12/10, he experienced overall weakness, trembling, vomited x 1, and chest congestion.  He did cough most of last night and had difficulty sleeping.  Today, the symptoms have resolved and he feel back to normal except for coughing last night.    Weight decreased to 201 lbs   Per hospital notes, Pt had mitral valve repair on 11/20 and converted to NSR.  Hospitalized 11/19-11/21.   Optivol thoracic impedance suggesting fluid levels improving but continues to suggest ongoing possible fluid accumulation starting 11/13.     Prescribed:  Torsemide 20 mg take 3 tablets (60 mg total) by mouth every morning and 2 tablets (40 mg total) every evening Potassium 20 mEq take 1 tablet by mouth daily   Labs: 09/06/2023 Creatinine 2.25, BUN 36, Potassium 4.4, Sodium 143, GFR 28 08/22/2023 Creatinine 2.44, BUN 43, Potassium 4.6, Sodium 134, GFR 26  08/21/2023 Creatinine 2.37, BUN 41, Potassium 4.2, Sodium 140, GFR 27  08/19/2023 Creatinine 2.79, BUN 44, Potassium 3.8, Sodium 136, GFR 22  08/09/2023 Creatinine 2.62, BUN  44, Potassium 3.8, Sodium 139, GFR 24 08/08/2023 Creatinine 2.46, BUN 41, Potassium 3.5, Sodium 137, GFR 26  08/07/2023 Creatinine 2.61, BUN 39, Potassium 3.5, Sodium 138, GFR 24  08/06/2023 Creatinine 2.75, BUN 37, Potassium 3.9, Sodium 139, GFR 22 (2:56 PM) 08/06/2023 Creatinine 2.43, BUN 33, Potassium 3.3, Sodium 138, GFR 26 (4:57 AM) 08/05/2023 Creatinine 2.17, BUN 28, Potassium 3.3, Sodium 142, GFR 30 07/24/2023 Creatinine 2.63, BUN 38, Potassium 4.2, Sodium 139, GFR 24  07/17/2023 Creatinine 2.41, BUN 28, Potassium 4.7, Sodium 143, GFR 26  07/01/2023 Creatinine 2.61, BUN 34, Potassium 4.3, Sodium 139, GFR 24 06/21/2023 Creatinine 2.91, BUN 41, Potassium 5.1, Sodium 138, GFR 21 06/12/2023 Creatinine 3.31, BUN 49, Potassium 5.6, Sodium 134, GFR 18 (Potassium addressed by HF clinic) A complete set of results can be found in Results Review.   Recommendations:  Sent to [place] Lee, NP HF clinic for review and recommendations if needed.     Follow-up plan: ICM clinic phone appointment on 10/21/2023 (after battery change on 09/16/2023).  91 day device clinic remote transmission pending battery replacement.     EP/Cardiology Next Office Visit:  09/26/2023 with HF clinic.     Copy of ICM check sent to Dr. Elberta Fortis.    3 month ICM trend: 09/11/2023.    12-14 Month ICM trend:     Karie Soda, RN 09/11/2023 1:36 PM

## 2023-09-11 NOTE — Telephone Encounter (Signed)
If he is taking torsemide 60 qam/40 qpm, have him increase to 60 mg bid with increased fluid index on device check and get BMET in 1 week + followup APP clinic next week.

## 2023-09-11 NOTE — Telephone Encounter (Signed)
Could someone call him and see what's going on?

## 2023-09-11 NOTE — Telephone Encounter (Signed)
Patient called echocardiogram department by mistake. Patient is feeling "shaking", weak and cough. Would like to someone to call him.

## 2023-09-11 NOTE — Telephone Encounter (Signed)
Will call to advise pt of medication adjustments  -pt is scheduled for BIV ICD GENERATOR CHANGEOUT on Monday 12/16

## 2023-09-11 NOTE — Addendum Note (Signed)
Addended by: Brantley Naser, Milagros Reap on: 09/11/2023 04:01 PM   Modules accepted: Orders

## 2023-09-11 NOTE — Telephone Encounter (Signed)
Pt called to report Coughing-mild congestion vomiting Hands trembling barely able to walk Weight down to 202 Symptoms started 12/9 in the PM No fever, aches, chills  Used the walker all day 12/10 No SOB   Pt reports he feel much better now Was advised to send transmission in the device clinic -opitvol is elevated

## 2023-09-12 NOTE — Progress Notes (Addendum)
[facility]   MULTIDISCIPLINARY HEART VALVE CLINIC                                     Cardiology Office Note:    Date:  09/13/2023   ID:  Chad Brown, DOB 10/23/1940, MRN [id number]  PCP:  Chad Polka., MD  [facility] Cardiologist:  Chad Ancona, MD  [facility] Electrophysiologist:  None   Referring MD: Chad Polka., MD   1 month s/p mTEER  History of Present Illness:    Chad Brown is a 81 y.o. male with a hx of PAD, OSA on CPAP, chronic HFrEF, NICM (EF down to 15% at one point), LBBB, s/p MDT CRT-D (2016), PAF s/p multiple cardioversions and ablation (2021) on amiodarone/Xarelto, CKD stage IIIb, HTN, pulm HTN, thyroid carcinoma s/p thyroidectomy (2022), pulmonary nodules, anemia, and severe MR s/p mTEER (08/2023) who presents to clinic for follow up.   [name] was diagnosed atrial fibrillation in 2014. Echocardiogram at that time showed EF 30-35%. He underwent multiple cardioversions and with LBBB he is now s/p Medtronic CRT-D placement by Dr. Johney Frame. By 11/2019 MR remained in the mild range by echo however, updated imaging this year showed progression with EF 40-45%, mild LV dilation, mildly decreased RV systolic function, moderate-severe MR with splay artifact and PISA ERO 0.44 cm^2, PASP 71 mmHg, dilated IVC.  At that time he was reporting progressive shortness of breath and weight gain with NYHA class III symptoms. TEE and RHC 08/05/23 showed EF 45-50% and severe functional MR with 3 adjacent jets, primarily originating from restricted P1/P2 portion of the mitral valve: ERO 0.55 cm^2 from largest jet. RHC with low CO and significantly elevated right and left heart filling pressures and he was admitted for volume optimization with Lasix and milrinone.   He ultimately underwent successful mitral transcatheter edge-to-edge repair with 1 XTW MitraClip positioned at the A2 P2 position and 1 NTW MitraClip positioned at the A1 P1 position on  08/21/23 by Dr. Lynnette Caffey. Had rapid afib requiring IV amio during admission and self converted during surgery. Diuresed with IV lasix. Continued on home Xarelto 15mg  daily.   He was AV paced when seen in afib clinic on 09/06/23 and had no recurrence of afib and improvement in SOB. Transitioned to [facility] 200mg  daily.  Today the patient presents to clinic for follow up. Here with his wife. He is doing better but has an ongoing cough. Was recently seen by pulm and Chest CT was ordered but not read yet. No CP. He does have some SOB, but improved from previous. Mild intermittent LE edema but no orthopnea or PND. No dizziness or syncope. No blood in stool or urine. No palpitations. He does have some fatigue.     Past Medical History:  Diagnosis Date   Adenomatous colon polyp    tubular   AICD (automatic cardioverter/defibrillator) present 03/08/2015   MDT CRTD dual pacemaker and defib   Anemia    iron deficient   Arthritis    "about all my joints; hands, knees, back" (03/08/2015)   Atherosclerosis    Cataract    left eye small   Cholelithiasis    gallstones   Chronic systolic CHF (congestive heart failure) (HCC)    a. New dx 12/2012 ? NICM, may be r/t afib. b. Nuc 03/2013 - normal;  c. 03/2015 TEE EF 15-20%.  Colon polyp, hyperplastic 01/2015   removed precancerous lesions   Depression    Diverticulosis    Dysrhythmia    afib   GERD (gastroesophageal reflux disease)    Glaucoma    right eye   Hyperlipidemia    Hypertension    Melanoma of eye (HCC) 2000's   "right; it's never been biopsied"   Melanoma of lower leg (HCC) 2015   "left; right at my knee"   Myocardial infarction (HCC) 1998   OSA (obstructive sleep apnea) 01/04/2016   no longer tolerates cpap   Peripheral vision loss, right 2006   Persistent atrial fibrillation (HCC)    a. Dx 12/2012, s/p TEE/DCCV 01/26/13. b. On Xarelto (CHA2DS2VASc = 3);  c. 03/2015 TEE (EF 15-20%, no LAA thrombus) and DCCV - amio increased to 200 mg bid.    Pneumonia    S/P mitral valve clip implantation 08/21/2023   Placement of 1 XTW in the A2/P2 position and 1 NTW in the A1/P1 position by Dr. Lynnette Caffey and Dr. Excell Seltzer   Urinary hesitancy due to benign prostatic hypertrophy      Current Medications: Current Meds  Medication Sig   acetaminophen (TYLENOL) 500 MG tablet Take 1,000 mg by mouth every 6 (six) hours as needed for mild pain (pain score 1-3), moderate pain (pain score 4-6) or headache.   amiodarone (PACERONE) 200 MG tablet Take 1 tablet (200 mg total) by mouth daily for 7 days.   cholecalciferol (VITAMIN D3) 25 MCG (1000 UNIT) tablet Take 1,000 Units by mouth in the morning.   Cyanocobalamin (VITAMIN B 12 PO) Take 1,000 mcg by mouth in the morning.   dorzolamide-timolol (COSOPT) 22.3-6.8 MG/ML ophthalmic solution Place 1 drop into the right eye 2 (two) times daily.   empagliflozin (JARDIANCE) 10 MG TABS tablet Take 1 tablet (10 mg total) by mouth daily.   escitalopram (LEXAPRO) 20 MG tablet Take 1 tablet (20 mg total) by mouth daily. (Patient taking differently: Take 20 mg by mouth every morning.)   Evolocumab (REPATHA SURECLICK) 140 MG/ML SOAJ INJECT 140MG  ( ) INTO SKIN EVERY 2 WEEKS   Ferrous Sulfate (IRON PO) Take 9 mg by mouth in the morning. Chewable   finasteride (PROSCAR) 5 MG tablet Take 1 tablet (5 mg total) by mouth daily. (Patient taking differently: Take 5 mg by mouth at bedtime.)   FOLIC ACID PO Take 1 tablet by mouth daily.   gabapentin (NEURONTIN) 100 MG capsule Take 200 mg by mouth 2 (two) times daily.    latanoprost (XALATAN) 0.005 % ophthalmic solution Place 1 drop into the right eye daily at 12 noon.   levothyroxine (SYNTHROID) 137 MCG tablet TAKE ONE TABLET DAILY BEFORE BREAKFAST. must make appointment FOR refills   Polyethyl Glycol-Propyl Glycol 0.4-0.3 % SOLN Place 1 drop into the left eye in the morning and at bedtime. Systane   potassium chloride SA (KLOR-CON M) 20 MEQ tablet Take 1 tablet (20 mEq total) by  mouth daily.   RHOPRESSA 0.02 % SOLN Place 1 drop into the right eye at bedtime.   tamsulosin (FLOMAX) 0.4 MG CAPS Take 0.4 mg by mouth at bedtime.    torsemide (DEMADEX) 20 MG tablet Take 3 tablets (60 mg total) by mouth 2 (two) times daily.   XARELTO 15 MG TABS tablet TAKE ONE TABLET DAILY WITH SUPPER      ROS:   Please see the history of present illness.    All other systems reviewed and are negative.  EKGs  Risk Assessment/Calculations:    CHA2DS2-VASc Score = 4   This indicates a 4.8% annual risk of stroke. The patient's score is based upon: CHF History: 1 HTN History: 1 Diabetes History: 0 Stroke History: 0 Vascular Disease History: 0 Age Score: 2 Gender Score: 0          Physical Exam:    VS:  BP 120/64   Pulse 60   Resp (!) 95   Ht 5\' 6"  (1.676 m)   Wt 204 lb 3.2 oz (92.6 kg)   BMI 32.96 kg/m     Wt Readings from Last 3 Encounters:  09/13/23 204 lb 3.2 oz (92.6 kg)  09/06/23 210 lb 15.7 oz (95.7 kg)  09/06/23 211 lb (95.7 kg)     GEN: Well nourished, well developed in no acute distress NECK: No JVD CARDIAC: RRR, no murmurs, rubs, gallops RESPIRATORY:  diffuse rhonchi and course breath sounds  ABDOMEN: Soft, non-tender, non-distended EXTREMITIES:  No edema; No deformity.  ASSESSMENT:    1. S/P mitral valve clip implantation   2. HFrEF (heart failure with reduced ejection fraction) (HCC)   3. Paroxysmal atrial fibrillation (HCC)   4. Stage 3 chronic kidney disease, unspecified whether stage 3a or 3b CKD (HCC)   5. Biventricular ICD (implantable cardioverter-defibrillator) in place   6. Cough, unspecified type     PLAN:    In order of problems listed above:  Severe mitral regurgitation s/p mTEER:  echo today shows EF 50%, normally functioning mTEER with mild residual MR and mild MS with a mean gradient of 4 mm hg. He has NYHA class II symptoms with an improvement since mTEER. SBE prophylaxis discussed; the patient is edentulous and  does not go to the dentist. I will see him back in 1 year with echo and OV.   HFrEF/NICM: appears euvolemic. EF up to 50% by echo today. Continue Jardiance 10mg  daily, torsemide 60mg  BID and Kclor daily.    PAF: sounds regular on exam today. Continue amio 200mg  daily and Xarelto 15mg  daily.    CKD Stage IIIb: Baseline ~ 2-2.4. Cr 2.25 on 09/06/23   LBBB s/p MDT CRT-D (2016): plan generator change out 12/16 with Dr. Elberta Fortis.   Cough: he has had an ongoing cough and lung sounds are course with rhonchi. No fevers or chills. He has a CT on 12/4 that has not been read. Will call in and get this expedited.    Medication Adjustments/Labs and Tests Ordered: Current medicines are reviewed at length with the patient today.  Concerns regarding medicines are outlined above.  No orders of the defined types were placed in this encounter.  No orders of the defined types were placed in this encounter.   Patient Instructions  Medication Instructions:  Your physician recommends that you continue on your current medications as directed. Please refer to the Current Medication list given to you today.  *If you need a refill on your cardiac medications before your next appointment, please call your pharmacy*   Lab Work: None ordered   If you have labs (blood work) drawn today and your tests are completely normal, you will receive your results only by: MyChart Message (if you have MyChart) OR A paper copy in the mail If you have any lab test that is abnormal or we need to change your treatment, we will call you to review the results.   Testing/Procedures: None ordered    Follow-Up: Followed up as scheduled   Other Instructions  Signed, Cline Crock, PA-C  09/13/2023 2:03 PM    [facility]

## 2023-09-13 ENCOUNTER — Ambulatory Visit: Payer: Medicare PPO | Attending: Internal Medicine

## 2023-09-13 ENCOUNTER — Ambulatory Visit: Payer: Medicare PPO | Admitting: Physician Assistant

## 2023-09-13 VITALS — BP 120/64 | HR 60 | Resp 95 | Ht 66.0 in | Wt 204.2 lb

## 2023-09-13 DIAGNOSIS — I340 Nonrheumatic mitral (valve) insufficiency: Secondary | ICD-10-CM | POA: Insufficient documentation

## 2023-09-13 DIAGNOSIS — N183 Chronic kidney disease, stage 3 unspecified: Secondary | ICD-10-CM | POA: Diagnosis not present

## 2023-09-13 DIAGNOSIS — Z9889 Other specified postprocedural states: Secondary | ICD-10-CM | POA: Diagnosis not present

## 2023-09-13 DIAGNOSIS — Z95818 Presence of other cardiac implants and grafts: Secondary | ICD-10-CM | POA: Diagnosis not present

## 2023-09-13 DIAGNOSIS — I5020 Unspecified systolic (congestive) heart failure: Secondary | ICD-10-CM

## 2023-09-13 DIAGNOSIS — R059 Cough, unspecified: Secondary | ICD-10-CM

## 2023-09-13 DIAGNOSIS — Z95810 Presence of automatic (implantable) cardiac defibrillator: Secondary | ICD-10-CM | POA: Insufficient documentation

## 2023-09-13 DIAGNOSIS — I480 Paroxysmal atrial fibrillation: Secondary | ICD-10-CM | POA: Insufficient documentation

## 2023-09-13 LAB — ECHOCARDIOGRAM COMPLETE
Area-P 1/2: 2.5 cm2
Est EF: 50
MV M vel: 2.36 m/s
MV Peak grad: 22.2 mm[Hg]
MV VTI: 1.38 cm2
S' Lateral: 3.6 cm

## 2023-09-13 NOTE — Patient Instructions (Signed)
Medication Instructions:  Your physician recommends that you continue on your current medications as directed. Please refer to the Current Medication list given to you today.  *If you need a refill on your cardiac medications before your next appointment, please call your pharmacy*   Lab Work: None ordered   If you have labs (blood work) drawn today and your tests are completely normal, you will receive your results only by: MyChart Message (if you have MyChart) OR A paper copy in the mail If you have any lab test that is abnormal or we need to change your treatment, we will call you to review the results.   Testing/Procedures: None ordered    Follow-Up: Followed up as scheduled   Other Instructions

## 2023-09-14 ENCOUNTER — Telehealth (HOSPITAL_BASED_OUTPATIENT_CLINIC_OR_DEPARTMENT_OTHER): Payer: Self-pay | Admitting: Pulmonary Disease

## 2023-09-14 NOTE — Telephone Encounter (Signed)
[facility] Telephone Encounter  Paged by Radiology regarding CT 09/04/23 with incidental finding of nondisplaced acute anterior left fourth and fifth rib fractures. No pneumothorax.  I contacted patient who confirmed he fell off his bed sometime after Thanksgiving. Did have some pain on that side but no other issues except for some coughing. Has not taken any over the counters as pain is minimal now.  I recommended PCP follow-up in 4-6 weeks for follow-up. He plans to call their office on the next working day. Also advised him if symptoms including shortness of breath, pain or coughing worsen he may warrant sooner medical evaluation. He agreed to plan.

## 2023-09-16 ENCOUNTER — Other Ambulatory Visit: Payer: Self-pay

## 2023-09-16 ENCOUNTER — Ambulatory Visit (HOSPITAL_COMMUNITY)
Admission: RE | Admit: 2023-09-16 | Discharge: 2023-09-16 | Disposition: A | Discharge status: Home / Self Care | Payer: Medicare PPO | Attending: Cardiology | Admitting: Cardiology

## 2023-09-16 ENCOUNTER — Encounter: Payer: Self-pay | Admitting: Internal Medicine

## 2023-09-16 ENCOUNTER — Encounter (HOSPITAL_COMMUNITY)
Admission: RE | Disposition: A | Discharge status: Home / Self Care | Payer: Self-pay | Source: Home / Self Care | Attending: Cardiology

## 2023-09-16 DIAGNOSIS — I110 Hypertensive heart disease with heart failure: Secondary | ICD-10-CM | POA: Diagnosis not present

## 2023-09-16 DIAGNOSIS — I5022 Chronic systolic (congestive) heart failure: Secondary | ICD-10-CM | POA: Insufficient documentation

## 2023-09-16 DIAGNOSIS — Z4502 Encounter for adjustment and management of automatic implantable cardiac defibrillator: Secondary | ICD-10-CM | POA: Insufficient documentation

## 2023-09-16 DIAGNOSIS — Z79899 Other long term (current) drug therapy: Secondary | ICD-10-CM | POA: Diagnosis not present

## 2023-09-16 DIAGNOSIS — Z87891 Personal history of nicotine dependence: Secondary | ICD-10-CM | POA: Diagnosis not present

## 2023-09-16 HISTORY — PX: BIV ICD GENERATOR CHANGEOUT: EP1194

## 2023-09-16 SURGERY — BIV ICD GENERATOR CHANGEOUT
Anesthesia: LOCAL

## 2023-09-16 MED ORDER — SODIUM CHLORIDE 0.9 % IV SOLN: INTRAVENOUS | Status: DC

## 2023-09-16 MED ORDER — POVIDONE-IODINE 10 % EX SWAB
2.0000 | Freq: Once | CUTANEOUS | Status: AC
  Administered 2023-09-16: 2 via TOPICAL

## 2023-09-16 MED ORDER — LIDOCAINE HCL (PF) 1 % IJ SOLN
INTRAMUSCULAR | Status: DC | PRN
  Administered 2023-09-16: 60 mL

## 2023-09-16 MED ORDER — CHLORHEXIDINE GLUCONATE 4 % EX SOLN: 4.0000 | Freq: Once | CUTANEOUS | Status: DC

## 2023-09-16 MED ORDER — LIDOCAINE HCL (PF) 1 % IJ SOLN
INTRAMUSCULAR | Status: AC
  Filled 2023-09-16: qty 30

## 2023-09-16 MED ORDER — FENTANYL CITRATE (PF) 100 MCG/2ML IJ SOLN
INTRAMUSCULAR | Status: DC | PRN
  Administered 2023-09-16: 25 ug via INTRAVENOUS

## 2023-09-16 MED ORDER — ONDANSETRON HCL 4 MG/2ML IJ SOLN: 4.0000 mg | Freq: Four times a day (QID) | INTRAMUSCULAR | Status: DC | PRN

## 2023-09-16 MED ORDER — SODIUM CHLORIDE 0.9 % IV SOLN
INTRAVENOUS | Status: AC
  Administered 2023-09-16: 80 mg
  Filled 2023-09-16: qty 2

## 2023-09-16 MED ORDER — SODIUM CHLORIDE 0.9 % IV SOLN: 80.0000 mg | INTRAVENOUS | Status: AC

## 2023-09-16 MED ORDER — LIDOCAINE HCL 1 % IJ SOLN
INTRAMUSCULAR | Status: AC
  Filled 2023-09-16: qty 60

## 2023-09-16 MED ORDER — ACETAMINOPHEN 325 MG PO TABS: 325.0000 mg | ORAL_TABLET | ORAL | Status: DC | PRN

## 2023-09-16 MED ORDER — CEFAZOLIN SODIUM-DEXTROSE 2-4 GM/100ML-% IV SOLN: 2.0000 g | INTRAVENOUS | Status: AC

## 2023-09-16 MED ORDER — FENTANYL CITRATE (PF) 100 MCG/2ML IJ SOLN
INTRAMUSCULAR | Status: AC
  Filled 2023-09-16: qty 2

## 2023-09-16 MED ORDER — MIDAZOLAM HCL 5 MG/5ML IJ SOLN
INTRAMUSCULAR | Status: DC | PRN
  Administered 2023-09-16: 1 mg via INTRAVENOUS

## 2023-09-16 MED ORDER — CEFAZOLIN SODIUM-DEXTROSE 2-4 GM/100ML-% IV SOLN
INTRAVENOUS | Status: AC
  Administered 2023-09-16: 2 g via INTRAVENOUS
  Filled 2023-09-16: qty 100

## 2023-09-16 MED ORDER — MIDAZOLAM HCL 5 MG/5ML IJ SOLN
INTRAMUSCULAR | Status: AC
  Filled 2023-09-16: qty 5

## 2023-09-16 SURGICAL SUPPLY — 6 items
CABLE SURGICAL S-101-97-12 (CABLE) ×1 IMPLANT
ICD COBALT XT QUAD CRT DTPA2QQ (ICD Generator) IMPLANT
PAD DEFIB RADIO PHYSIO CONN (PAD) ×1 IMPLANT
POUCH AIGIS-R ANTIBACT ICD (Mesh General) ×1 IMPLANT
POUCH AIGIS-R ANTIBACT ICD LRG (Mesh General) IMPLANT
TRAY PACEMAKER INSERTION (PACKS) ×1 IMPLANT

## 2023-09-16 NOTE — Progress Notes (Signed)
ADVANCED HF CLINIC NOTE   Patient ID: Chad Brown, male   DOB: [birth year], 82 y.o.   MRN: [id number] PCP: Cleatis Polka., MD HF Cardiologist: Shirlee Latch  Chief complaint: Heart failure follow up  Chad Brown is an 82 y.o. with paroxysmal atrial fibrillation and chronic systolic CHF thought to be due to nonischemic cardiomyopathy presents for followup of CHF. He has a cardiac history dating back to 1998, when he was admitted with chest pain and had cardiac cath showing nonobstructive CAD. In 4/14, he was found to be in atrial fibrillation.  Echo showed EF 30-35%.  He had DCCV back to NSR.  He was back in atrial fibrillation in 1/16, and EF was low again on TEE at that time.   Again, he had DCCV.  In 5/16, cardiac MRI showed persistently low EF, so given LBBB, he had Medtronic CRT-D device placed.  He was back in atrial fibrillation in 6/16 and had TEE-guided DCCV again with EF now 15-20% on TEE.  Repeat echo in 2/17 showed EF 55-60%. Echo in 1/19 showed EF 45-50%, mild LV and RV dilation.   Echo 1/20 showed EF 45-50%, diffuse hypokinesis.   Atrial fibrillation ablation was done in 2/21.  Echo 2/21 showed EF 40-45%, PASP 50 mmHg, mild MR. Echo in 6/22 also showed EF 40-45%.   Patient was admitted in 1/24 after a syncopal episode.  He had gone to the gym and exercised and was standing talking to someone when he got lightheaded and passed out.  No arrhythmia on device interrogation.  He was hypotensive and orthostatic in the ER. Coreg and Entresto were stopped and Lasix was decreased to 20 mg bid.    Spironolactone was stopped due to hyperkalemia.   Echo 10/24 EF 40-45%, mild LV dilation, mildly decreased RV systolic function, moderate-severe MR with splay artifact and PISA ERO 0.44 cm^2, PASP 71 mmHg, dilated IVC.    Admitted 11/4-11/8/24 for scheduled RHC/TEE, he was admitted for severe MR in the setting of low output and volume overload requiring Mirinone and lasic gtt. Seen by  [facility] with plans to optimize and perform outpatient mTEER. Discharged on Torsemide 60 mg daily.   Now s/p mTEER with clips x2 08/21/23 with Dr. Lynnette Caffey. He was discharged home on Xarelto and Amio increased to 200 bid for 1 wk for AF and resumed on Torsemide 60 mg daily.   Device interrogation 11/25 showed increased fluid impedence, he was instructed to take Torsemide 80 for 2 days.  Had generator change out 09/16/23 with Dr. Elberta Fortis   Today he returns for AHF follow up. Overall feeling good. Denies palpitations, CP, dizziness, edema, or PND/Orthopnea. Denies SOB. Appetite ok. No fever or chills. Weight at home 199-203 pounds. Taking all medications.   Medtronic device interrogation today: Optivol and thoracic impendence stable, 2.4h/day activity, no VT, >99% VP. (personally reviewed)   ECG (personally reviewed from 09/06/23): A-BiV pacing at 80 bpm  PMH: 1. Atrial fibrillation: Paroxysmal.  Diagnosed 4/14, DCCV 4/14 to NSR.  DCCV 1/16 to NSR.  DCCV 6/16 to NSR.  2. Chronic systolic CHF: Nonischemic cardiomyopathy.  LHC in 1998 with nonobstructive disease.  Cardiolite in 6/14 with no ischemia or infarction.  cMRI (5/16) with EF 34%, mildly dilated LV with diffuse HK worse in anterolateral wall, small punctate areas of LGE in anteroseptum and basal inferior wall (not CAD pattern).  TEE (6/16) with EF 15-20%.  He has Medtronic CRT-D device.  Echo (9/16) with EF 40%,  moderate LV dilation, grade II diastolic dysfunction, normal RV size and systolic function, PASP 42 mmHg.  - Hypotension with Entresto.  - Echo (2/17) showed LV functional recovery, EF 55-60% with mild MR.    - Echo (1/19): EF 45-50%, mild LV dilation, mild RV dilation - Echo (1/20): EF 45-50%, diffuse hypokinesis, moderate diastolic dysfunction, normal RV size and systolic function.  - Echo (2/21): EF 40-45%, PASP 50 mmHg, mild MR.  - Echo (6/22): EF 40-45%, moderate LV dilation, mild LVH, RV normal - Echo (10/24): EF  40-45%, mild LV dilation, mildly decreased RV systolic function, moderate-severe MR with splay artifact and PISA ERO 0.44 cm^2, PASP 71 mmHg, dilated IVC.   - Echo (11/24): EF 45-50%, gHK. RV function normal, mildly enlarged, RVSP 52.8, severely dilated LA and RA. Mild MR with replacement, ~RA3 3. Hyperlipidemia: Myalgias with atorvastatin, Livalo, and pravastatin.  4. CKD stage 3 5. OA: s/p THR.  6. H/o melanoma 7. Anemia 8. Diverticulosis 9. OSA: Uses CPAP.  10. Pulmonary nodules: Followed by Dr. Ellin Saba.  11. Pancreatic pseudocysts 12. PAD: ABIs (2/20) were 0.94 on right and 1.03 on left.  13. Papillary thyroid carcinoma: s/p thyroidectomy 8/22.  14. Mitral regurgitation: At lease moderately severe on 10/24 echo.   SH: Married with 3 children, lives in [place], retired, quit smoking in 2001.    FH: Mother with MI  ROS: All systems reviewed and negative except as per HPI.  Current Outpatient Medications  Medication Sig Dispense Refill   acetaminophen (TYLENOL) 500 MG tablet Take 1,000 mg by mouth every 6 (six) hours as needed for mild pain (pain score 1-3), moderate pain (pain score 4-6) or headache.     cholecalciferol (VITAMIN D3) 25 MCG (1000 UNIT) tablet Take 1,000 Units by mouth in the morning.     Cyanocobalamin (VITAMIN B 12 PO) Take 1,000 mcg by mouth in the morning.     dorzolamide-timolol (COSOPT) 22.3-6.8 MG/ML ophthalmic solution Place 1 drop into the right eye 2 (two) times daily.     empagliflozin (JARDIANCE) 10 MG TABS tablet Take 1 tablet (10 mg total) by mouth daily. 30 tablet 11   escitalopram (LEXAPRO) 20 MG tablet Take 1 tablet (20 mg total) by mouth daily. (Patient taking differently: Take 20 mg by mouth every morning.) 90 tablet 0   Evolocumab (REPATHA SURECLICK) 140 MG/ML SOAJ INJECT 140MG  ( ) INTO SKIN EVERY 2 WEEKS 6 mL 3   Ferrous Sulfate (IRON PO) Take 9 mg by mouth in the morning. Chewable     finasteride (PROSCAR) 5 MG tablet Take 1 tablet (5  mg total) by mouth daily. (Patient taking differently: Take 5 mg by mouth at bedtime.) 30 tablet 2   FOLIC ACID PO Take 1 tablet by mouth daily.     gabapentin (NEURONTIN) 100 MG capsule Take 200 mg by mouth 2 (two) times daily.      latanoprost (XALATAN) 0.005 % ophthalmic solution Place 1 drop into the right eye daily at 12 noon.     levothyroxine (SYNTHROID) 137 MCG tablet TAKE ONE TABLET DAILY BEFORE BREAKFAST. must make appointment FOR refills 90 tablet 1   Polyethyl Glycol-Propyl Glycol 0.4-0.3 % SOLN Place 1 drop into the left eye in the morning and at bedtime. Systane     potassium chloride SA (KLOR-CON M) 20 MEQ tablet Take 1 tablet (20 mEq total) by mouth daily. 30 tablet 6   RHOPRESSA 0.02 % SOLN Place 1 drop into the right eye at bedtime.  tamsulosin (FLOMAX) 0.4 MG CAPS Take 0.4 mg by mouth at bedtime.      torsemide (DEMADEX) 20 MG tablet Take 3 tablets (60 mg total) by mouth 2 (two) times daily. 180 tablet 6   XARELTO 15 MG TABS tablet TAKE ONE TABLET DAILY WITH SUPPER 90 tablet 1   amiodarone (PACERONE) 200 MG tablet Take 1 tablet (200 mg total) by mouth daily for 7 days.     No current facility-administered medications for this encounter.   Wt Readings from Last 3 Encounters:  09/26/23 92.7 kg (204 lb 6.4 oz)  09/16/23 90.3 kg (199 lb)  09/13/23 92.6 kg (204 lb 3.2 oz)   BP 129/77   Pulse 72   Ht 5\' 6"  (1.676 m)   Wt 92.7 kg (204 lb 6.4 oz)   SpO2 95%   BMI 32.99 kg/m   Physical Exam: General:  elderly appearing.  No respiratory difficulty HEENT: normal Neck: supple. JVD flat. Carotids 2+ bilat; no bruits. No lymphadenopathy or thyromegaly appreciated. Cor: PMI nondisplaced. Regular rate & rhythm. No rubs, gallops or murmurs. Lungs: clear Abdomen: soft, nontender, nondistended. No hepatosplenomegaly. No bruits or masses. Good bowel sounds. Extremities: no cyanosis, clubbing, rash, edema  Neuro: alert & oriented x 3, cranial nerves grossly intact. moves all 4  extremities w/o difficulty. Affect pleasant.   Assessment/Plan: 1. Chronic systolic CHF: Suspect nonischemic cardiomyopathy.  EF 15-20% on 6/16 TEE.  Cardiac MRI in 5/16 showed a LGE pattern that was not suggestive of coronary disease (?myocarditis or infiltrative disease).  There may be a component of tachycardia-mediated cardiomyopathy.  However, the cardiac MRI appears to have been done when he was in NSR. He has Medtronic CRT-D device.  Echo (6/22) with EF 40-45%.  Echo (10/24) showed EF 40-45%, mild LV dilation, mildly decreased RV systolic function, moderate-severe MR with splay artifact and PISA ERO 0.44 cm^2, PASP 71 mmHg, dilated IVC. RHC 11/4 with significant mitral regurg, elevated filling pressures and low output, and severe mixed pulm venous and arterial hypertension. Mitral regurgitation may be leading to worsening CHF. Now s/p mTEER with mitral clip x2 11/24. Echo 12/24: EF 50%, LV with GHK, RV mildly reduced, LA mod dilated, s/p mitral clip placement, mild MR - NYHA class II symptoms. Volume appears stable today - GDMT has been limited in past by orthostatic symptoms and CKD.  - Continue Torsemide 60 mg BID and Potassium 20 mEq daily. BNP/BMET today. May need to back off Torsemide a little, will wait for labs to come back but he does appear euvolemic today.  - Continue Coreg at low dose 3.125 mg bid.  - Off spironolactone with hyperkalemia.  - Continue Jardiance 10 mg daily.  - He will stay off Entresto with worsening renal function.  2. Atrial fibrillation: Paroxysmal. He had increased AF burden and was started on 200 mg daily amiodarone.  99.9% BiV paced. - Continue Xarelto 15 mg daily. CBC today - S/p generator change out 09/16/23 with Dr. Elberta Fortis. Still with steri-strips in place. 3. Hyperlipidemia: Continue Repatha. 4. OSA: Encouraged compliance with CPAP.   5. PAD: Minimal claudication.  Continue Repatha.  6. CKD: Stage 3.  Follows with nephrology in [place].  - Continue  SGLT2i.  7. Mitral regurgitation: s/p mTEER and clips x2.   Follow up in 3-4 months with Dr. Thressa Sheller, NP 09/26/2023

## 2023-09-16 NOTE — Discharge Instructions (Signed)

## 2023-09-16 NOTE — H&P (Signed)
Virtual Visit via Video Note   Because of Chad Brown's co-morbid illnesses, he is at least at moderate risk for complications without adequate follow up.  This format is felt to be most appropriate for this patient at this time.  All issues noted in this document were discussed and addressed.  A limited physical exam was performed with this format.  Please refer to the patient's chart for his consent to telehealth for [facility].       Date:  09/16/2023   ID:  Chad Brown, DOB [birth year], MRN [id number] The patient was identified using 2 identifiers.  Patient Location: Home Provider Location: Office/Clinic   PCP:  Cleatis Polka., MD   [facility] Providers Cardiologist:  Marca Ancona, MD Sleep Medicine:  Armanda Magic, MD     Evaluation Performed:  Follow-Up Visit  Chief Complaint:  ICD ERI  History of Present Illness:    Chad Brown is a 82 y.o. male with chronic systolic heart failure.  He is status post [employer] ICD.  His device is reached ERI.  Has plans for ICD generator change.  The patient tells me today that he has been more short of breath.  His Lasix dose was increased recently, and he is lost 8 pounds.  Despite this, he continues to have episodes of shortness of breath, making him feel quite poorly.  Today, denies symptoms of palpitations, chest pain, shortness of breath, orthopnea, PND, lower extremity edema, claudication, dizziness, presyncope, syncope, bleeding, or neurologic sequela. The patient is tolerating medications without difficulties. Plan generator change today.    Past Medical History:  Diagnosis Date   Adenomatous colon polyp    tubular   AICD (automatic cardioverter/defibrillator) present 03/08/2015   MDT CRTD dual pacemaker and defib   Anemia    iron deficient   Arthritis    "about all my joints; hands, knees, back" (03/08/2015)   Atherosclerosis    Cataract    left eye small   Cholelithiasis     gallstones   Chronic systolic CHF (congestive heart failure) (HCC)    a. New dx 12/2012 ? NICM, may be r/t afib. b. Nuc 03/2013 - normal;  c. 03/2015 TEE EF 15-20%.   Colon polyp, hyperplastic 01/2015   removed precancerous lesions   Depression    Diverticulosis    Dysrhythmia    afib   GERD (gastroesophageal reflux disease)    Glaucoma    right eye   Hyperlipidemia    Hypertension    Melanoma of eye (HCC) 2000's   "right; it's never been biopsied"   Melanoma of lower leg (HCC) 2015   "left; right at my knee"   Myocardial infarction (HCC) 1998   OSA (obstructive sleep apnea) 01/04/2016   no longer tolerates cpap   Peripheral vision loss, right 2006   Persistent atrial fibrillation (HCC)    a. Dx 12/2012, s/p TEE/DCCV 01/26/13. b. On Xarelto (CHA2DS2VASc = 3);  c. 03/2015 TEE (EF 15-20%, no LAA thrombus) and DCCV - amio increased to 200 mg bid.   Pneumonia    S/P mitral valve clip implantation 08/21/2023   Placement of 1 XTW in the A2/P2 position and 1 NTW in the A1/P1 position by Dr. Lynnette Caffey and Dr. Excell Seltzer   Urinary hesitancy due to benign prostatic hypertrophy    Past Surgical History:  Procedure Laterality Date   ATRIAL FIBRILLATION ABLATION N/A 11/03/2019   Procedure: ATRIAL FIBRILLATION ABLATION;  Surgeon: Hillis Range, MD;  Location: [facility] INVASIVE CV LAB;  Service: Cardiovascular;  Laterality: N/A;   BACK SURGERY     upper back, cannot turn neck well   CARDIAC CATHETERIZATION  1998   CARDIOVERSION N/A 01/26/2013   Procedure: CARDIOVERSION;  Surgeon: Lewayne Bunting, MD;  Location: [facility] ENDOSCOPY;  Service: Cardiovascular;  Laterality: N/A;   CARDIOVERSION N/A 03/23/2015   Procedure: CARDIOVERSION;  Surgeon: Jake Bathe, MD;  Location: [facility] ENDOSCOPY;  Service: Cardiovascular;  Laterality: N/A;   CARDIOVERSION N/A 08/14/2017   Procedure: CARDIOVERSION;  Surgeon: Wendall Stade, MD;  Location: [facility] ENDOSCOPY;  Service: Cardiovascular;  Laterality: N/A;   CATARACT EXTRACTION Right ~  2006   COLONOSCOPY WITH PROPOFOL N/A 02/10/2015   Procedure: COLONOSCOPY WITH PROPOFOL;  Surgeon: Iva Boop, MD;  Location: [facility] ENDOSCOPY;  Service: Endoscopy;  Laterality: N/A;   COLONOSCOPY WITH PROPOFOL N/A 08/07/2016   Procedure: COLONOSCOPY WITH PROPOFOL;  Surgeon: Iva Boop, MD;  Location: [facility] ENDOSCOPY;  Service: Endoscopy;  Laterality: N/A;   ENTEROSCOPY N/A 08/17/2015   Procedure: ENTEROSCOPY;  Surgeon: Iva Boop, MD;  Location: [facility] ENDOSCOPY;  Service: Endoscopy;  Laterality: N/A;   EP IMPLANTABLE DEVICE N/A 03/08/2015   MDT Ovidio Kin CRT-D for nonischemic CM by Dr Johney Frame for primary prevention   GLAUCOMA SURGERY Right ~ 2006   "put 3 stents in to drain fluid" (03/08/2015) not successful, sent to [facility] to try to get last stent out   HOT HEMOSTASIS N/A 08/07/2016   Procedure: HOT HEMOSTASIS (ARGON PLASMA COAGULATION/BICAP);  Surgeon: Iva Boop, MD;  Location: [facility] ENDOSCOPY;  Service: Endoscopy;  Laterality: N/A;   INCISION AND DRAINAGE ABSCESS POSTERIOR CERVICALSPINE  05/2012   JOINT REPLACEMENT     MELANOMA EXCISION Left 2015   "lower leg; right at my knee"   REFRACTIVE SURGERY Right ~ 2006 X 2   "twice; both done at [facility]" (03/08/2015   RIGHT HEART CATH N/A 08/05/2023   Procedure: RIGHT HEART CATH;  Surgeon: Laurey Morale, MD;  Location: [facility] INVASIVE CV LAB;  Service: Cardiovascular;  Laterality: N/A;   SURGERY SCROTAL / TESTICULAR Right 1990's   TEE WITHOUT CARDIOVERSION N/A 01/26/2013   Procedure: TRANSESOPHAGEAL ECHOCARDIOGRAM (TEE);  Surgeon: Lewayne Bunting, MD;  Location: [facility] ENDOSCOPY;  Service: Cardiovascular;  Laterality: N/A;  [name] anes. /    TEE WITHOUT CARDIOVERSION N/A 10/05/2014   Procedure: TRANSESOPHAGEAL ECHOCARDIOGRAM (TEE)  with cardioversion;  Surgeon: Vesta Mixer, MD;  Location: [facility] ENDOSCOPY;  Service: Cardiovascular;  Laterality: N/A;  12:52 synched cardioversion at 120 joules,...afib to SR...12 lead EKG ordered.Marland KitchenMarland KitchenCardiozem d/c'ed per MD verbal order  at SR   TEE WITHOUT CARDIOVERSION N/A 03/23/2015   Procedure: TRANSESOPHAGEAL ECHOCARDIOGRAM (TEE);  Surgeon: Jake Bathe, MD;  Location: [facility] ENDOSCOPY;  Service: Cardiovascular;  Laterality: N/A;   TEE WITHOUT CARDIOVERSION N/A 11/02/2019   Procedure: TRANSESOPHAGEAL ECHOCARDIOGRAM (TEE);  Surgeon: Wendall Stade, MD;  Location: [facility] ENDOSCOPY;  Service: Cardiovascular;  Laterality: N/A;   TEE WITHOUT CARDIOVERSION N/A 08/05/2023   Procedure: TRANSESOPHAGEAL ECHOCARDIOGRAM;  Surgeon: Laurey Morale, MD;  Location: [facility] INVASIVE CV LAB;  Service: Cardiovascular;  Laterality: N/A;   THORACIC SPINE SURGERY  03/2000   "ground calcium deposits from upper thoracic" (01/26/2013)   THYROIDECTOMY N/A 05/11/2021   Procedure: TOTAL THYROIDECTOMY;  Surgeon: Darnell Level, MD;  Location: [facility] ORS;  Service: General;  Laterality: N/A;   TOTAL HIP ARTHROPLASTY Right 06/2007   TRANSCATHETER MITRAL EDGE TO EDGE REPAIR N/A 08/21/2023   Procedure: TRANSCATHETER  MITRAL EDGE TO EDGE REPAIR;  Surgeon: [name], MD;  Location: [facility] INVASIVE CV LAB;  Service: Cardiovascular;  Laterality: N/A;   TRANSESOPHAGEAL ECHOCARDIOGRAM (CATH LAB) N/A 08/09/2023   Procedure: TRANSESOPHAGEAL ECHOCARDIOGRAM;  Surgeon: Laurey Morale, MD;  Location: [facility] INVASIVE CV LAB;  Service: Cardiovascular;  Laterality: N/A;   TRANSESOPHAGEAL ECHOCARDIOGRAM (CATH LAB) N/A 08/21/2023   Procedure: TRANSESOPHAGEAL ECHOCARDIOGRAM;  Surgeon: [name], MD;  Location: [facility] INVASIVE CV LAB;  Service: Cardiovascular;  Laterality: N/A;     Current Meds  Medication Sig   acetaminophen (TYLENOL) 500 MG tablet Take 1,000 mg by mouth every 6 (six) hours as needed for mild pain (pain score 1-3), moderate pain (pain score 4-6) or headache.   amiodarone (PACERONE) 200 MG tablet Take 1 tablet (200 mg total) by mouth daily for 7 days.   cholecalciferol (VITAMIN D3) 25 MCG (1000 UNIT) tablet Take 1,000 Units by mouth in the morning.   Cyanocobalamin (VITAMIN  B 12 PO) Take 1,000 mcg by mouth in the morning.   dorzolamide-timolol (COSOPT) 22.3-6.8 MG/ML ophthalmic solution Place 1 drop into the right eye 2 (two) times daily.   empagliflozin (JARDIANCE) 10 MG TABS tablet Take 1 tablet (10 mg total) by mouth daily.   escitalopram (LEXAPRO) 20 MG tablet Take 1 tablet (20 mg total) by mouth daily. (Patient taking differently: Take 20 mg by mouth every morning.)   Evolocumab (REPATHA SURECLICK) 140 MG/ML SOAJ INJECT 140MG  ( ) INTO SKIN EVERY 2 WEEKS   Ferrous Sulfate (IRON PO) Take 9 mg by mouth in the morning. Chewable   finasteride (PROSCAR) 5 MG tablet Take 1 tablet (5 mg total) by mouth daily. (Patient taking differently: Take 5 mg by mouth at bedtime.)   FOLIC ACID PO Take 1 tablet by mouth daily.   gabapentin (NEURONTIN) 100 MG capsule Take 200 mg by mouth 2 (two) times daily.    latanoprost (XALATAN) 0.005 % ophthalmic solution Place 1 drop into the right eye daily at 12 noon.   levothyroxine (SYNTHROID) 137 MCG tablet TAKE ONE TABLET DAILY BEFORE BREAKFAST. must make appointment FOR refills   Polyethyl Glycol-Propyl Glycol 0.4-0.3 % SOLN Place 1 drop into the left eye in the morning and at bedtime. Systane   potassium chloride SA (KLOR-CON M) 20 MEQ tablet Take 1 tablet (20 mEq total) by mouth daily.   RHOPRESSA 0.02 % SOLN Place 1 drop into the right eye at bedtime.   tamsulosin (FLOMAX) 0.4 MG CAPS Take 0.4 mg by mouth at bedtime.    torsemide (DEMADEX) 20 MG tablet Take 3 tablets (60 mg total) by mouth 2 (two) times daily.   XARELTO 15 MG TABS tablet TAKE ONE TABLET DAILY WITH SUPPER   [DISCONTINUED] torsemide (DEMADEX) 20 MG tablet Take 3 tablets (60 mg total) by mouth every morning AND 2 tablets (40 mg total) every evening.     Allergies:   Pravastatin sodium and Azithromycin   Social History   Tobacco Use   Smoking status: Former    Current packs/day: 0.00    Average packs/day: 3.0 packs/day for 48.0 years (144.0 ttl pk-yrs)     Types: Cigarettes    Start date: 09/28/1952    Quit date: 09/28/2000    Years since quitting: 22.9   Smokeless tobacco: Never   Tobacco comments:    Former smoker 09/06/23  Vaping Use   Vaping status: Never Used  Substance Use Topics   Alcohol use: No   Drug use: No  Family Hx: The patient's family history includes Colon cancer in his paternal uncle; Diabetes in his maternal aunt, maternal grandmother, maternal uncle, and mother; Heart attack in his maternal uncle; Heart disease in his mother; Kidney disease in his mother; Leukemia in his father; Lung cancer in his paternal uncle; Prostate cancer in his paternal uncle; Thyroid disease in his sister.  ROS:   Please see the history of present illness.     All other systems reviewed and are negative.   Prior CV studies:   The following studies were reviewed today:    Labs/Other Tests and Data Reviewed:    EKG:  An ECG dated 03/07/23 was personally reviewed today and demonstrated:  AV paced  Recent Labs: 08/05/2023: TSH 0.700 08/19/2023: ALT 16 08/20/2023: Magnesium 2.3 08/22/2023: Hemoglobin 10.0; Platelets 135 09/06/2023: B Natriuretic Peptide 294.8; BUN 36; Creatinine, Ser 2.25; Potassium 4.4; Sodium 143   Recent Lipid Panel Lab Results  Component Value Date/Time   CHOL 103 07/17/2023 12:27 PM   TRIG 155 (H) 07/17/2023 12:27 PM   HDL 28 (L) 07/17/2023 12:27 PM   CHOLHDL 3.7 07/17/2023 12:27 PM   LDLCALC 44 07/17/2023 12:27 PM    Wt Readings from Last 3 Encounters:  09/16/23 90.3 kg  09/13/23 92.6 kg  09/06/23 95.7 kg     Risk Assessment/Calculations:    CHA2DS2-VASc Score = 4   This indicates a 4.8% annual risk of stroke. The patient's score is based upon: CHF History: 1 HTN History: 1 Diabetes History: 0 Stroke History: 0 Vascular Disease History: 0 Age Score: 2 Gender Score: 0         Objective:    Vital Signs:  BP 135/70   Pulse 65   Temp 98.2 F (36.8 C) (Oral)   Resp 17   Ht 5\' 6"   (1.676 m)   Wt 90.3 kg   SpO2 95%   BMI 32.12 kg/m    GEN: No acute distress.   Neck: No JVD Cardiac: RRR, no murmurs, rubs, or gallops.  Respiratory: decreased BS bases bilaterally. GI: Soft, nontender, non-distended  MS: No edema; No deformity. Neuro:  Nonfocal  Skin: warm and dry, device site well healed Psych: Normal affect    ASSESSMENT & PLAN:    Chronic systolic heart failure: Chad Brown has presented today for surgery, with the diagnosis of CHF.  The various methods of treatment have been discussed with the patient and family. After consideration of risks, benefits and other options for treatment, the patient has consented to  Procedure(s): ICD generator changeimplant as a surgical intervention .  Risks include but not limited to bleeding, infection, pneumothorax, perforation, tamponade, vascular damage, renal failure, MI, stroke, death, and lead dislodgement . The patient's history has been reviewed, patient examined, no change in status, stable for surgery.  I have reviewed the patient's chart and labs.  Questions were answered to the patient's satisfaction.   Duwan Adrian Elberta Fortis, MD 09/16/2023 1:31 PM

## 2023-09-17 ENCOUNTER — Encounter (HOSPITAL_COMMUNITY): Payer: Self-pay | Admitting: Cardiology

## 2023-09-17 ENCOUNTER — Telehealth: Payer: Self-pay

## 2023-09-17 NOTE — Telephone Encounter (Signed)
I was able to get the pt serial number to his monitor. He is going to take the tape off his wound later today. He will start his [facility] Thursday. The pt did not have any questions for [place].Pt also agreed to plug his monitor in and to sit by it for 10 minutes.

## 2023-09-17 NOTE — Telephone Encounter (Signed)
Follow-up after same day discharge: Implant date: 09/16/2023 MD: Loman Brooklyn, MD Device: ICD MDT  Location: L CHEST    Wound check visit: YES 90 day MD follow-up: YES  Remote Transmission received:NO.   Dressing/sling removed: N/A  Confirm OAC restart on: N/A  Please continue to monitor your cardiac device site for redness, swelling, and drainage. Call the device clinic at [phone] if you experience these symptoms, fever/chills, or have questions about your device.   Remote monitoring is used to monitor your cardiac device from home. This monitoring is scheduled every 91 days by our office. It allows [place] to keep an eye on the functioning of your device to ensure it is working properly.   Unable to reach pt lvm for pt to call back. Need monitor serial number to put in carelink

## 2023-09-18 NOTE — Telephone Encounter (Signed)
Transmission received 09/17/2023

## 2023-09-23 ENCOUNTER — Ambulatory Visit: Payer: Medicare PPO | Admitting: Internal Medicine

## 2023-09-23 ENCOUNTER — Other Ambulatory Visit (HOSPITAL_COMMUNITY): Payer: Medicare PPO

## 2023-09-26 ENCOUNTER — Encounter (HOSPITAL_COMMUNITY): Payer: Self-pay

## 2023-09-26 ENCOUNTER — Ambulatory Visit (HOSPITAL_COMMUNITY)
Admission: RE | Admit: 2023-09-26 | Discharge: 2023-09-26 | Disposition: A | Discharge status: Home / Self Care | Payer: Medicare PPO | Source: Ambulatory Visit | Attending: Internal Medicine | Admitting: Internal Medicine

## 2023-09-26 VITALS — BP 129/77 | HR 72 | Ht 66.0 in | Wt 204.4 lb

## 2023-09-26 DIAGNOSIS — E782 Mixed hyperlipidemia: Secondary | ICD-10-CM | POA: Diagnosis not present

## 2023-09-26 DIAGNOSIS — I4891 Unspecified atrial fibrillation: Secondary | ICD-10-CM | POA: Diagnosis not present

## 2023-09-26 DIAGNOSIS — I5022 Chronic systolic (congestive) heart failure: Secondary | ICD-10-CM | POA: Insufficient documentation

## 2023-09-26 DIAGNOSIS — N183 Chronic kidney disease, stage 3 unspecified: Secondary | ICD-10-CM | POA: Diagnosis not present

## 2023-09-26 DIAGNOSIS — I480 Paroxysmal atrial fibrillation: Secondary | ICD-10-CM | POA: Diagnosis not present

## 2023-09-26 DIAGNOSIS — Z79899 Other long term (current) drug therapy: Secondary | ICD-10-CM | POA: Insufficient documentation

## 2023-09-26 DIAGNOSIS — E785 Hyperlipidemia, unspecified: Secondary | ICD-10-CM | POA: Diagnosis not present

## 2023-09-26 DIAGNOSIS — Z7901 Long term (current) use of anticoagulants: Secondary | ICD-10-CM | POA: Diagnosis not present

## 2023-09-26 DIAGNOSIS — G4733 Obstructive sleep apnea (adult) (pediatric): Secondary | ICD-10-CM | POA: Insufficient documentation

## 2023-09-26 DIAGNOSIS — Z9889 Other specified postprocedural states: Secondary | ICD-10-CM

## 2023-09-26 DIAGNOSIS — I130 Hypertensive heart and chronic kidney disease with heart failure and stage 1 through stage 4 chronic kidney disease, or unspecified chronic kidney disease: Secondary | ICD-10-CM | POA: Diagnosis not present

## 2023-09-26 DIAGNOSIS — Z95818 Presence of other cardiac implants and grafts: Secondary | ICD-10-CM

## 2023-09-26 DIAGNOSIS — I739 Peripheral vascular disease, unspecified: Secondary | ICD-10-CM | POA: Insufficient documentation

## 2023-09-26 LAB — CBC
HCT: 37 % — ABNORMAL LOW (ref 39.0–52.0)
Hemoglobin: 11.5 g/dL — ABNORMAL LOW (ref 13.0–17.0)
MCH: 29.9 pg (ref 26.0–34.0)
MCHC: 31.1 g/dL (ref 30.0–36.0)
MCV: 96.1 fL (ref 80.0–100.0)
Platelets: 259 10*3/uL (ref 150–400)
RBC: 3.85 MIL/uL — ABNORMAL LOW (ref 4.22–5.81)
RDW: 15.4 % (ref 11.5–15.5)
WBC: 6.8 10*3/uL (ref 4.0–10.5)
nRBC: 0 % (ref 0.0–0.2)

## 2023-09-26 LAB — BASIC METABOLIC PANEL
Anion gap: 11 (ref 5–15)
BUN: 46 mg/dL — ABNORMAL HIGH (ref 8–23)
CO2: 26 mmol/L (ref 22–32)
Calcium: 8.2 mg/dL — ABNORMAL LOW (ref 8.9–10.3)
Chloride: 103 mmol/L (ref 98–111)
Creatinine, Ser: 2.62 mg/dL — ABNORMAL HIGH (ref 0.61–1.24)
GFR, Estimated: 24 mL/min — ABNORMAL LOW (ref >60)
Glucose, Bld: 118 mg/dL — ABNORMAL HIGH (ref 70–99)
Potassium: 4.5 mmol/L (ref 3.5–5.1)
Sodium: 140 mmol/L (ref 135–145)

## 2023-09-26 LAB — BRAIN NATRIURETIC PEPTIDE: B Natriuretic Peptide: 237.7 pg/mL — ABNORMAL HIGH (ref 0.0–100.0)

## 2023-09-26 NOTE — Patient Instructions (Signed)
Medication Changes:  No Changes In Medications at this time.   Lab Work:  Labs done today, your results will be available in MyChart, we will contact you for abnormal readings.  Follow-Up in: 4 months PLEASE CALL OUR OFFICE AROUND FEBRUARY TO GET SCHEDULED FOR YOUR APPOINTMENT. PHONE NUMBER IS [phone] OPTION 2   At the [facility], you and your health needs are our priority. We have a designated team specialized in the treatment of Heart Failure. This Care Team includes your primary Heart Failure Specialized Cardiologist (physician), Advanced Practice Providers (APPs- Physician Assistants and Nurse Practitioners), and Pharmacist who all work together to provide you with the care you need, when you need it.   You may see any of the following providers on your designated Care Team at your next follow up:  Dr. Arvilla Meres Dr. Marca Ancona Dr. Dorthula Nettles Dr. Theresia Bough Tonye Becket, NP Robbie Lis, [facility] [place], [place] Brynda Peon, NP [place] Lee, NP Karle Plumber, PharmD   Please be sure to bring in all your medications bottles to every appointment.   Need to Contact us:  If you have any questions or concerns before your next appointment please send [place] a message through [place] or call our office at [phone].    TO LEAVE A MESSAGE FOR THE NURSE SELECT OPTION 2, PLEASE LEAVE A MESSAGE INCLUDING: YOUR NAME DATE OF BIRTH CALL BACK NUMBER REASON FOR CALL**this is important as we prioritize the call backs  YOU WILL RECEIVE A CALL BACK THE SAME DAY AS LONG AS YOU CALL BEFORE 4:00 PM

## 2023-09-30 ENCOUNTER — Encounter (HOSPITAL_COMMUNITY): Payer: Self-pay | Admitting: Oncology

## 2023-09-30 ENCOUNTER — Other Ambulatory Visit (HOSPITAL_COMMUNITY): Payer: Self-pay

## 2023-10-02 NOTE — Patient Instructions (Signed)
   After Your ICD (Implantable Cardiac Defibrillator)    Monitor your defibrillator site for redness, swelling, and drainage. Call the device clinic at [phone] if you experience these symptoms or fever/chills.  Your incision was closed with Steri-strips or staples:  You may shower 7 days after your procedure and wash your incision with soap and water. Avoid lotions, ointments, or perfumes over your incision until it is well-healed.  You may use a hot tub or a pool after your wound check appointment if the incision is completely closed.   There are no other restrictions in arm movement after your wound check appointment.  Your ICD is designed to protect you from life threatening heart rhythms. Because of this, you may receive a shock.   1 shock with no symptoms:  Call the office during business hours. 1 shock with symptoms (chest pain, chest pressure, dizziness, lightheadedness, shortness of breath, overall feeling unwell):  Call 911. If you experience 2 or more shocks in 24 hours:  Call 911. If you receive a shock, you should not drive.  [place] DMV - no driving for 6 months if you receive appropriate therapy from your ICD.   ICD Alerts:  Some alerts are vibratory and others beep. These are NOT emergencies. Please call our office to let us know. If this occurs at night or on weekends, it can wait until the next business day. Send a remote transmission.  If your device is capable of reading fluid status (for heart failure), you will be offered monthly monitoring to review this with you.   Remote monitoring is used to monitor your ICD from home. This monitoring is scheduled every 91 days by our office. It allows [place] to keep an eye on the functioning of your device to ensure it is working properly. You will routinely see your Electrophysiologist annually (more often if necessary).

## 2023-10-03 ENCOUNTER — Ambulatory Visit: Payer: Medicare PPO | Attending: Cardiovascular Disease

## 2023-10-03 DIAGNOSIS — I5022 Chronic systolic (congestive) heart failure: Secondary | ICD-10-CM

## 2023-10-03 LAB — CUP PACEART INCLINIC DEVICE CHECK
Date Time Interrogation Session: 20250102135230
Implantable Lead Connection Status: 753985
Implantable Lead Connection Status: 753985
Implantable Lead Connection Status: 753985
Implantable Lead Implant Date: 20160607
Implantable Lead Implant Date: 20160607
Implantable Lead Implant Date: 20160607
Implantable Lead Location: 753858
Implantable Lead Location: 753859
Implantable Lead Location: 753860
Implantable Lead Model: 4598
Implantable Lead Model: 5076
Implantable Pulse Generator Implant Date: 20241216

## 2023-10-03 NOTE — Progress Notes (Signed)

## 2023-10-07 ENCOUNTER — Inpatient Hospital Stay: Payer: Medicare PPO | Attending: Hematology

## 2023-10-07 DIAGNOSIS — I130 Hypertensive heart and chronic kidney disease with heart failure and stage 1 through stage 4 chronic kidney disease, or unspecified chronic kidney disease: Secondary | ICD-10-CM | POA: Diagnosis not present

## 2023-10-07 DIAGNOSIS — D696 Thrombocytopenia, unspecified: Secondary | ICD-10-CM | POA: Insufficient documentation

## 2023-10-07 DIAGNOSIS — N1832 Chronic kidney disease, stage 3b: Secondary | ICD-10-CM | POA: Insufficient documentation

## 2023-10-07 DIAGNOSIS — D631 Anemia in chronic kidney disease: Secondary | ICD-10-CM | POA: Diagnosis not present

## 2023-10-07 DIAGNOSIS — I5022 Chronic systolic (congestive) heart failure: Secondary | ICD-10-CM | POA: Diagnosis not present

## 2023-10-07 DIAGNOSIS — E538 Deficiency of other specified B group vitamins: Secondary | ICD-10-CM

## 2023-10-07 DIAGNOSIS — Z85820 Personal history of malignant melanoma of skin: Secondary | ICD-10-CM | POA: Diagnosis not present

## 2023-10-07 DIAGNOSIS — Z85850 Personal history of malignant neoplasm of thyroid: Secondary | ICD-10-CM | POA: Diagnosis not present

## 2023-10-07 LAB — IRON AND TIBC
Iron: 81 ug/dL (ref 45–182)
Saturation Ratios: 26 % (ref 17.9–39.5)
TIBC: 312 ug/dL (ref 250–450)
UIBC: 231 ug/dL

## 2023-10-07 LAB — CBC WITH DIFFERENTIAL/PLATELET
Abs Immature Granulocytes: 0.02 10*3/uL (ref 0.00–0.07)
Basophils Absolute: 0 10*3/uL (ref 0.0–0.1)
Basophils Relative: 1 %
Eosinophils Absolute: 0.1 10*3/uL (ref 0.0–0.5)
Eosinophils Relative: 2 %
HCT: 39.1 % (ref 39.0–52.0)
Hemoglobin: 11.8 g/dL — ABNORMAL LOW (ref 13.0–17.0)
Immature Granulocytes: 0 %
Lymphocytes Relative: 16 %
Lymphs Abs: 0.9 10*3/uL (ref 0.7–4.0)
MCH: 29.3 pg (ref 26.0–34.0)
MCHC: 30.2 g/dL (ref 30.0–36.0)
MCV: 97 fL (ref 80.0–100.0)
Monocytes Absolute: 0.5 10*3/uL (ref 0.1–1.0)
Monocytes Relative: 9 %
Neutro Abs: 4.4 10*3/uL (ref 1.7–7.7)
Neutrophils Relative %: 72 %
Platelets: 150 10*3/uL (ref 150–400)
RBC: 4.03 MIL/uL — ABNORMAL LOW (ref 4.22–5.81)
RDW: 16.1 % — ABNORMAL HIGH (ref 11.5–15.5)
WBC: 6 10*3/uL (ref 4.0–10.5)
nRBC: 0 % (ref 0.0–0.2)

## 2023-10-07 LAB — COMPREHENSIVE METABOLIC PANEL
ALT: 23 U/L (ref 0–44)
AST: 16 U/L (ref 15–41)
Albumin: 3.6 g/dL (ref 3.5–5.0)
Alkaline Phosphatase: 72 U/L (ref 38–126)
Anion gap: 8 (ref 5–15)
BUN: 42 mg/dL — ABNORMAL HIGH (ref 8–23)
CO2: 32 mmol/L (ref 22–32)
Calcium: 8.7 mg/dL — ABNORMAL LOW (ref 8.9–10.3)
Chloride: 101 mmol/L (ref 98–111)
Creatinine, Ser: 2.42 mg/dL — ABNORMAL HIGH (ref 0.61–1.24)
GFR, Estimated: 26 mL/min — ABNORMAL LOW (ref >60)
Glucose, Bld: 92 mg/dL (ref 70–99)
Potassium: 4.6 mmol/L (ref 3.5–5.1)
Sodium: 141 mmol/L (ref 135–145)
Total Bilirubin: 0.5 mg/dL (ref 0.0–1.2)
Total Protein: 7.3 g/dL (ref 6.5–8.1)

## 2023-10-07 LAB — FOLATE: Folate: 19.3 ng/mL (ref >5.9)

## 2023-10-07 LAB — FERRITIN: Ferritin: 163 ng/mL (ref 24–336)

## 2023-10-07 LAB — VITAMIN B12: Vitamin B-12: 987 pg/mL — ABNORMAL HIGH (ref 180–914)

## 2023-10-09 LAB — METHYLMALONIC ACID, SERUM: Methylmalonic Acid, Quantitative: 401 nmol/L — ABNORMAL HIGH (ref 0–378)

## 2023-10-10 ENCOUNTER — Other Ambulatory Visit (HOSPITAL_COMMUNITY): Payer: Self-pay

## 2023-10-11 ENCOUNTER — Telehealth: Payer: Self-pay | Admitting: Cardiology

## 2023-10-11 NOTE — Telephone Encounter (Signed)
 Patient is returning call in regards to model on monitor he was asked to provide.   Model # [id number]

## 2023-10-11 NOTE — Progress Notes (Signed)
 VIRTUAL VISIT via TELEPHONE NOTE [facility]   I connected with Chad Brown  on 10/14/23 at  9:18 AM by telephone and verified that I am speaking with the correct person using two identifiers.  Location: Patient: Home Provider: [facility]   I discussed the limitations, risks, security and privacy concerns of performing an evaluation and management service by telephone and the availability of in person appointments. I also discussed with the patient that there may be a patient responsible charge related to this service. The patient expressed understanding and agreed to proceed.  REASON FOR VISIT:  Follow-up for normocytic anemia and thrombocytopenia   CURRENT THERAPY: Iron  Gummies, B12/folate supplements  INTERVAL HISTORY:   Chad Brown 83 y.o. male returns for routine follow-up of anemia and thrombocytopenia.  He was last seen by Chad Barefoot PA-C on 04/10/2023.  Since his last visit, he was hospitalized from 08/05/2023 through 08/09/2023 due to acute on chronic heart failure.  He was hospitalized from 08/20/2023 through 08/22/2023 for mitral valve repair pair.  At today's visit, he reports feeling fairly well.  He is taking iron  supplement daily at home.  He has been taking B12 500 mcg daily and folic acid  400 mcg daily since his visit in June 2023.    He reports some mild fatigue with baseline energy about 65%.  He admits to mild cough as well as some diarrhea, which are chronic/intermittent.  He denies any B symptoms such as fever, chills, night sweats, unintentional weight loss.   No shortness of breath, chest pain, nausea, vomiting, abdominal pain.   He has not noticed any recent bleeding events such as hematemesis, hematochezia, or melena.   He does bruise easily, but notes that he is also on Xarelto  for atrial fibrillation.  He has 65% energy and 100% appetite. He reports losing about 10 pounds in fluid weight after starting Lasix  in  November.  ASSESSMENT & PLAN:  1.  Normocytic anemia: - Combination anemia from CKD and relative iron  deficiency. - Colonoscopy (08/07/2016): Polyps x 3 (tubular adenomas), diverticulosis, internal hemorrhoids - Small bowel enteroscopy (08/17/2015): Esophageal polyp, gastritis, scattered xanthomas and small bowel otherwise normal enteroscopy with no cause of blood loss/anemia seen - He takes a daily iron  gummy, B12 500 mcg, folate 400 mcg - No bright red blood per rectum or melena - Most recent labs (10/07/2023): Hgb 11.8/MCV 97.0.  Ferritin 163, iron  saturation 26%.  Baseline CKD stage IIIb/IV (creatinine 2.42/GFR 26).  Normal B12, MMA, folate. - PLAN: Hemoglobin at goal. - Continue daily iron  supplement plus vitamin B12 500 mcg daily and folic acid  400 mcg daily. - Repeat labs and RTC in 6 months  -- Would consider ESA if Hgb < 9.5 with normal iron  stores   2.  Mild thrombocytopenia: - He has intermittent mild thrombocytopenia for several years duration. -- B12 and folate deficiency noted June 2023 - He takes a daily iron  gummy, B12 500 mcg, folate 400 mcg  - CBC from 10/07/2023 shows normal platelets 150.  Normal B12, MMA, folate. -- DIFFERENTIAL DIAGNOSIS: Thrombocytopenia secondary to nutritional deficiencies, versus mild ITP, versus early MDS - PLAN: Continue vitamin B12 500 mcg daily and folic acid  400 mcg daily. - Repeat labs and RTC in 6 months    3.  Lung nodules: - He is a former smoker, smoked more than 2 packs/day for many years and quit in 2001. - Follows with [facility] pulmonology for annual CT chest, most recently completed on  09/04/2023   4.  T2 N0 melanoma of the left thigh: - Status post resection in 2015 at [facility].   5.  Papillary thyroid  carcinoma: - Thyroid  surgery by Dr. Eletha (total thyroidectomy 05/11/2021) - He had radioactive iodine  pill in January 2023 under the direction of Dr. Kassie and Dr. Malva - Follows with endocrinologist, Dr. Lenis.   6.  CKD stage  IIIb/IV - Follows with Dr. Rachele.   7.  Other history - PMH: Paroxysmal atrial fibrillation (on Xarelto ), hypertension, hyperlipidemia, systolic CHF, sleep apnea, coronary artery disease   PLAN SUMMARY: >> Labs in 6 months  (CBC/D, CMP, ferritin, iron /TIBC, B12, MMA, folate) >> OFFICE visit 1 week after labs     REVIEW OF SYSTEMS:   Review of Systems  Constitutional:  Positive for malaise/fatigue. Negative for chills, diaphoresis, fever and weight loss.  Respiratory:  Positive for cough. Negative for shortness of breath.   Cardiovascular:  Negative for chest pain and palpitations.  Gastrointestinal:  Positive for diarrhea. Negative for abdominal pain, blood in stool, melena, nausea and vomiting.  Neurological:  Negative for dizziness and headaches.     PHYSICAL EXAM: (per limitations of virtual telephone visit)  The patient is alert and oriented x 3, exhibiting adequate mentation, good mood, and ability to speak in full sentences and execute sound judgement.  WRAP UP:   I discussed the assessment and treatment plan with the patient. The patient was provided an opportunity to ask questions and all were answered. The patient agreed with the plan and demonstrated an understanding of the instructions.   The patient was advised to call back or seek an in-person evaluation if the symptoms worsen or if the condition fails to improve as anticipated.  I provided 17 minutes of non-face-to-face time during this encounter, including >10 minutes of medical discussion.  Chad CHRISTELLA Barefoot, PA-C 10/14/2023 10:19 AM

## 2023-10-14 ENCOUNTER — Other Ambulatory Visit (HOSPITAL_COMMUNITY): Payer: Self-pay

## 2023-10-14 ENCOUNTER — Inpatient Hospital Stay: Payer: Medicare PPO | Admitting: Physician Assistant

## 2023-10-14 DIAGNOSIS — D631 Anemia in chronic kidney disease: Secondary | ICD-10-CM

## 2023-10-14 DIAGNOSIS — E538 Deficiency of other specified B group vitamins: Secondary | ICD-10-CM

## 2023-10-14 DIAGNOSIS — N1832 Chronic kidney disease, stage 3b: Secondary | ICD-10-CM

## 2023-10-14 DIAGNOSIS — D696 Thrombocytopenia, unspecified: Secondary | ICD-10-CM

## 2023-10-22 ENCOUNTER — Ambulatory Visit: Payer: Medicare PPO | Attending: Cardiology

## 2023-10-22 DIAGNOSIS — Z95810 Presence of automatic (implantable) cardiac defibrillator: Secondary | ICD-10-CM | POA: Diagnosis not present

## 2023-10-22 DIAGNOSIS — I5022 Chronic systolic (congestive) heart failure: Secondary | ICD-10-CM

## 2023-10-23 ENCOUNTER — Telehealth: Payer: Self-pay

## 2023-10-23 ENCOUNTER — Ambulatory Visit (HOSPITAL_COMMUNITY)
Admission: RE | Admit: 2023-10-23 | Discharge: 2023-10-23 | Disposition: A | Discharge status: Home / Self Care | Payer: Medicare PPO | Source: Ambulatory Visit | Attending: Internal Medicine | Admitting: Internal Medicine

## 2023-10-23 VITALS — BP 84/66 | HR 104 | Ht 66.0 in | Wt 204.4 lb

## 2023-10-23 DIAGNOSIS — Z7901 Long term (current) use of anticoagulants: Secondary | ICD-10-CM | POA: Insufficient documentation

## 2023-10-23 DIAGNOSIS — D6869 Other thrombophilia: Secondary | ICD-10-CM | POA: Insufficient documentation

## 2023-10-23 DIAGNOSIS — I4819 Other persistent atrial fibrillation: Secondary | ICD-10-CM | POA: Diagnosis not present

## 2023-10-23 DIAGNOSIS — I5022 Chronic systolic (congestive) heart failure: Secondary | ICD-10-CM | POA: Diagnosis not present

## 2023-10-23 DIAGNOSIS — R9431 Abnormal electrocardiogram [ECG] [EKG]: Secondary | ICD-10-CM | POA: Insufficient documentation

## 2023-10-23 DIAGNOSIS — Z95810 Presence of automatic (implantable) cardiac defibrillator: Secondary | ICD-10-CM | POA: Diagnosis not present

## 2023-10-23 DIAGNOSIS — Z79899 Other long term (current) drug therapy: Secondary | ICD-10-CM | POA: Insufficient documentation

## 2023-10-23 DIAGNOSIS — N189 Chronic kidney disease, unspecified: Secondary | ICD-10-CM | POA: Diagnosis not present

## 2023-10-23 DIAGNOSIS — E785 Hyperlipidemia, unspecified: Secondary | ICD-10-CM | POA: Diagnosis not present

## 2023-10-23 MED ORDER — AMIODARONE HCL 200 MG PO TABS: ORAL_TABLET | ORAL | 1 refills | Status: DC

## 2023-10-23 NOTE — Telephone Encounter (Signed)
Remote transmission reviewed. Presenting rhythm appears AF w controlled VR's. Note increase in AF burden and decrease in Bi-V pacing. Pt advised I will forward to AF clinic to review in hopes for apt. Patient is agreeable to plan. Pt given ED precautions for worsening symptoms.

## 2023-10-23 NOTE — Patient Instructions (Signed)
Increase amiodarone to 200mg  twice a day until follow up    Cardioversion scheduled for: Monday, January 27th   - Arrive at the [facility] and go to admitting at 915am   - Do not eat or drink anything after midnight the night prior to your procedure.   - Take all your morning medication (except diabetic medications) with a sip of water prior to arrival.  - You will not be able to drive home after your procedure.    - Do NOT miss any doses of your blood thinner - if you should miss a dose please notify our office immediately.   - If you feel as if you go back into normal rhythm prior to scheduled cardioversion, please notify our office immediately.   If your procedure is canceled in the cardioversion suite you will be charged a cancellation fee.     Hold below medications 72 hours prior to scheduled procedure/anesthesia. Restart medication on the following day after scheduled procedure/anesthesia  Empagliflozin (Jardiance)  **Patients on the above medications scheduled for elective procedures that have not held the medication for the appropriate amount of time are at risk of cancellation or change in the anesthetic plan.

## 2023-10-23 NOTE — Progress Notes (Signed)
EPIC Encounter for ICM Monitoring  Patient Name: Chad Brown is a 83 y.o. male Date: 10/23/2023 Primary Care Physican: Cleatis Polka., MD Primary Cardiologist: Nishan/McLean Electrophysiologist: Kathreen Cornfield Pacing: 99.9%   07/31/2023 Weight: 209 lbs 08/28/2023 Weight: 206.6 lbs 09/11/2023 Weight: 201 lbs 10/23/2023 Weight: 198 lbs    Clinical Status  (20-Oct-2023 to 22-Oct-2023) Time in AT/AF 19.4 hours/day (81.0 %) AT/AF 421 episodes   Spoke with patient and heart failure questions reviewed.  Transmission results reviewed.  Pt reports HR ranging 90-110, weakness, and not feeling well over the past 3 days but 1/21 he felt the worse.     Optivol thoracic impedance suggesting normal fluid levels but AT/AF Burden increased to 81%.  Phone note sent to device clinic for review and follow up.     Prescribed:  Torsemide 20 mg take 3 tablets (60 mg total) by mouth twice a day Potassium 20 mEq take 1 tablet by mouth daily   Labs: 10/07/2023 Creatinine 2.42, BUN 42, Potassium 4.6, Sodium 141, GFR 26 09/26/2023 Creatinine 2.62, BUN 46, Potassium 4.5, Sodium 140, GFR 24 09/06/2023 Creatinine 2.25, BUN 36, Potassium 4.4, Sodium 143, GFR 28 08/22/2023 Creatinine 2.44, BUN 43, Potassium 4.6, Sodium 134, GFR 26   A complete set of results can be found in Results Review.   Recommendations:   No changes and encouraged to call if experiencing any fluid symptoms.   Phone note sent to device clinic for review of 1/21 transmission.    Follow-up plan: ICM clinic phone appointment on 2/24.  91 day device clinic remote transmission 12/26/2023.     EP/Cardiology Next Office Visit:  12/17/2023 with Francis Dowse, PA.    Copy of ICM check sent to Dr. Elberta Fortis.    3 month ICM trend: 10/22/2023.    12-14 Month ICM trend:     Karie Soda, RN 10/23/2023 10:19 AM

## 2023-10-23 NOTE — Progress Notes (Addendum)
Primary Care Physician: Cleatis Polka., MD Primary Cardiologist: Eden Emms [facility]: Dr Shirlee Latch Primary Electrophysiologist: Dr Elberta Fortis Referring Physician: Dr Christie Nottingham is a 83 y.o. male with a history of persistent atrial fibrillation, systolic CHF, CKD, OSA, HLD, who presents for follow up in the [facility].  The patient was initially diagnosed with atrial fibrillation 12/2012. Patient is on Xarelto for a CHADS2VASC score of 4. Echo showed EF 30-35%.  He had DCCV back to NSR.  He was back in atrial fibrillation in 1/16, and EF was low again on TEE at that time and he had DCCV.  In 5/16, cardiac MRI showed persistently low EF, so given LBBB, he had [employer] CRT-D device placed.  He was back in atrial fibrillation in 6/16 and had TEE-guided DCCV. Patient noted to be back in afib 10/2019 despite amiodarone therapy. He underwent afib ablation with Dr Johney Frame on 11/03/19. Patient reports that he has done well since the procedure. His weight has been stable and no lower extremity edema. Patient does report that he fell about 3 weeks ago and hit the side of his face on the trash can. He did not seek evaluation for this. No presyncope, loss of consciousness, or residual symptoms. He denies chest pain, swallowing, or groin issues.  On follow up 5/8, he is currently in SR today. Recent review of device on 4/29 showed 30 episodes of AF with 6% burden. Patient notes that when he was in episodes would feel anxious, weak, and dizzy. No shortness of breath or chest pain with episodes.   On follow up 09/06/23, he is currently in AV paced rhythm. Admission for diuresis/volume optimization secondary to acute on chronic HF with reduced EF 11/4-8/24. Admission 11/19-21/24 for severe mitral regurgitation s/p transcatheter edge to edge mitral valve repair. He was noted to be in rapid Afib at admission and started on IV amiodarone. Discharged on amiodarone 200 mg TID then to 200 mg  BID. He has noted no episodes of Afib since hospital discharge on 11/21. He feels better since mitral valve repair, notes to be a little less SOB, and has a little bit more energy. He is currently taking amiodarone 200 mg BID. No missed doses of Xarelto 15 mg daily. He shows me a log of daily weights and for December has been in between 204.4 and 205.6 lbs (today 205 lbs).   On follow up 10/23/23, he is currently in Afib. Patient contacted office today noting he has not felt well the past 3 days; device transmission showed Afib with controlled VR. He notes to feel weak and dizzy at times when out of rhythm. No missed doses of Xarelto.   Today, he denies symptoms of palpitations, orthopnea, PND, lower extremity edema, presyncope, syncope, snoring, daytime somnolence, bleeding, or neurologic sequela. The patient is tolerating medications without difficulties and is otherwise without complaint today.    Atrial Fibrillation Risk Factors:  he does have symptoms or diagnosis of sleep apnea. he is compliant with CPAP therapy. he does not have a history of rheumatic fever.  he has a BMI of Body mass index is 32.99 kg/m.[name] Filed Weights   10/23/23 1540  Weight: 92.7 kg    Family History  Problem Relation Age of Onset   Kidney disease Mother    Heart disease Mother        MI, open heart   Diabetes Mother        dialysis   Leukemia  Father    Colon cancer Paternal Uncle    Lung cancer Paternal Uncle        x 2   Prostate cancer Paternal Uncle    Diabetes Maternal Grandmother    Heart attack Maternal Uncle    Diabetes Maternal Aunt        x 3   Diabetes Maternal Uncle    Thyroid disease Sister     Atrial Fibrillation Management history:  Previous antiarrhythmic drugs: amiodarone Previous cardioversions: several, most recently 2018 Previous ablations: 11/03/19 CHADS2VASC score: 4 Anticoagulation history: Xarelto   Past Medical History:  Diagnosis Date   Adenomatous colon polyp     tubular   AICD (automatic cardioverter/defibrillator) present 03/08/2015   MDT CRTD dual pacemaker and defib   Anemia    iron deficient   Arthritis    "about all my joints; hands, knees, back" (03/08/2015)   Atherosclerosis    Cataract    left eye small   Cholelithiasis    gallstones   Chronic systolic CHF (congestive heart failure) (HCC)    a. New dx 12/2012 ? NICM, may be r/t afib. b. Nuc 03/2013 - normal;  c. 03/2015 TEE EF 15-20%.   Colon polyp, hyperplastic 01/2015   removed precancerous lesions   Depression    Diverticulosis    Dysrhythmia    afib   GERD (gastroesophageal reflux disease)    Glaucoma    right eye   Hyperlipidemia    Hypertension    Melanoma of eye (HCC) 2000's   "right; it's never been biopsied"   Melanoma of lower leg (HCC) 2015   "left; right at my knee"   Myocardial infarction (HCC) 1998   OSA (obstructive sleep apnea) 01/04/2016   no longer tolerates cpap   Peripheral vision loss, right 2006   Persistent atrial fibrillation (HCC)    a. Dx 12/2012, s/p TEE/DCCV 01/26/13. b. On Xarelto (CHA2DS2VASc = 3);  c. 03/2015 TEE (EF 15-20%, no LAA thrombus) and DCCV - amio increased to 200 mg bid.   Pneumonia    S/P mitral valve clip implantation 08/21/2023   Placement of 1 XTW in the A2/P2 position and 1 NTW in the A1/P1 position by Dr. Lynnette Caffey and Dr. Excell Seltzer   Urinary hesitancy due to benign prostatic hypertrophy    Past Surgical History:  Procedure Laterality Date   ATRIAL FIBRILLATION ABLATION N/A 11/03/2019   Procedure: ATRIAL FIBRILLATION ABLATION;  Surgeon: Hillis Range, MD;  Location: [facility] INVASIVE CV LAB;  Service: Cardiovascular;  Laterality: N/A;   BACK SURGERY     upper back, cannot turn neck well   BIV ICD GENERATOR CHANGEOUT N/A 09/16/2023   Procedure: BIV ICD GENERATOR CHANGEOUT;  Surgeon: Regan Lemming, MD;  Location: [facility] INVASIVE CV LAB;  Service: Cardiovascular;  Laterality: N/A;   CARDIAC CATHETERIZATION  1998   CARDIOVERSION N/A  01/26/2013   Procedure: CARDIOVERSION;  Surgeon: Lewayne Bunting, MD;  Location: [facility] ENDOSCOPY;  Service: Cardiovascular;  Laterality: N/A;   CARDIOVERSION N/A 03/23/2015   Procedure: CARDIOVERSION;  Surgeon: Jake Bathe, MD;  Location: [facility] ENDOSCOPY;  Service: Cardiovascular;  Laterality: N/A;   CARDIOVERSION N/A 08/14/2017   Procedure: CARDIOVERSION;  Surgeon: Wendall Stade, MD;  Location: [facility] ENDOSCOPY;  Service: Cardiovascular;  Laterality: N/A;   CATARACT EXTRACTION Right ~ 2006   COLONOSCOPY WITH PROPOFOL N/A 02/10/2015   Procedure: COLONOSCOPY WITH PROPOFOL;  Surgeon: Iva Boop, MD;  Location: [facility] ENDOSCOPY;  Service: Endoscopy;  Laterality: N/A;   COLONOSCOPY  WITH PROPOFOL N/A 08/07/2016   Procedure: COLONOSCOPY WITH PROPOFOL;  Surgeon: Iva Boop, MD;  Location: [facility] ENDOSCOPY;  Service: Endoscopy;  Laterality: N/A;   ENTEROSCOPY N/A 08/17/2015   Procedure: ENTEROSCOPY;  Surgeon: Iva Boop, MD;  Location: [facility] ENDOSCOPY;  Service: Endoscopy;  Laterality: N/A;   EP IMPLANTABLE DEVICE N/A 03/08/2015   MDT Ovidio Kin CRT-D for nonischemic CM by Dr Johney Frame for primary prevention   GLAUCOMA SURGERY Right ~ 2006   "put 3 stents in to drain fluid" (03/08/2015) not successful, sent to [facility] to try to get last stent out   HOT HEMOSTASIS N/A 08/07/2016   Procedure: HOT HEMOSTASIS (ARGON PLASMA COAGULATION/BICAP);  Surgeon: Iva Boop, MD;  Location: [facility] ENDOSCOPY;  Service: Endoscopy;  Laterality: N/A;   INCISION AND DRAINAGE ABSCESS POSTERIOR CERVICALSPINE  05/2012   JOINT REPLACEMENT     MELANOMA EXCISION Left 2015   "lower leg; right at my knee"   REFRACTIVE SURGERY Right ~ 2006 X 2   "twice; both done at [facility]" (03/08/2015   RIGHT HEART CATH N/A 08/05/2023   Procedure: RIGHT HEART CATH;  Surgeon: Laurey Morale, MD;  Location: [facility] INVASIVE CV LAB;  Service: Cardiovascular;  Laterality: N/A;   SURGERY SCROTAL / TESTICULAR Right 1990's   TEE WITHOUT CARDIOVERSION N/A 01/26/2013    Procedure: TRANSESOPHAGEAL ECHOCARDIOGRAM (TEE);  Surgeon: Lewayne Bunting, MD;  Location: [facility] ENDOSCOPY;  Service: Cardiovascular;  Laterality: N/A;  [name] anes. /    TEE WITHOUT CARDIOVERSION N/A 10/05/2014   Procedure: TRANSESOPHAGEAL ECHOCARDIOGRAM (TEE)  with cardioversion;  Surgeon: Vesta Mixer, MD;  Location: [facility] ENDOSCOPY;  Service: Cardiovascular;  Laterality: N/A;  12:52 synched cardioversion at 120 joules,...afib to SR...12 lead EKG ordered.Marland KitchenMarland KitchenCardiozem d/c'ed per MD verbal order at SR   TEE WITHOUT CARDIOVERSION N/A 03/23/2015   Procedure: TRANSESOPHAGEAL ECHOCARDIOGRAM (TEE);  Surgeon: Jake Bathe, MD;  Location: [facility] ENDOSCOPY;  Service: Cardiovascular;  Laterality: N/A;   TEE WITHOUT CARDIOVERSION N/A 11/02/2019   Procedure: TRANSESOPHAGEAL ECHOCARDIOGRAM (TEE);  Surgeon: Wendall Stade, MD;  Location: [facility] ENDOSCOPY;  Service: Cardiovascular;  Laterality: N/A;   TEE WITHOUT CARDIOVERSION N/A 08/05/2023   Procedure: TRANSESOPHAGEAL ECHOCARDIOGRAM;  Surgeon: Laurey Morale, MD;  Location: [facility] INVASIVE CV LAB;  Service: Cardiovascular;  Laterality: N/A;   THORACIC SPINE SURGERY  03/2000   "ground calcium deposits from upper thoracic" (01/26/2013)   THYROIDECTOMY N/A 05/11/2021   Procedure: TOTAL THYROIDECTOMY;  Surgeon: Darnell Level, MD;  Location: [facility] ORS;  Service: General;  Laterality: N/A;   TOTAL HIP ARTHROPLASTY Right 06/2007   TRANSCATHETER MITRAL EDGE TO EDGE REPAIR N/A 08/21/2023   Procedure: TRANSCATHETER MITRAL EDGE TO EDGE REPAIR;  Surgeon: [name], MD;  Location: [facility] INVASIVE CV LAB;  Service: Cardiovascular;  Laterality: N/A;   TRANSESOPHAGEAL ECHOCARDIOGRAM (CATH LAB) N/A 08/09/2023   Procedure: TRANSESOPHAGEAL ECHOCARDIOGRAM;  Surgeon: Laurey Morale, MD;  Location: [facility] INVASIVE CV LAB;  Service: Cardiovascular;  Laterality: N/A;   TRANSESOPHAGEAL ECHOCARDIOGRAM (CATH LAB) N/A 08/21/2023   Procedure: TRANSESOPHAGEAL ECHOCARDIOGRAM;  Surgeon: [name], MD;   Location: [facility] INVASIVE CV LAB;  Service: Cardiovascular;  Laterality: N/A;    Current Outpatient Medications  Medication Sig Dispense Refill   acetaminophen (TYLENOL) 500 MG tablet Take 1,000 mg by mouth 2 (two) times daily.     amiodarone (PACERONE) 200 MG tablet Take 1 tablet (200 mg total) by mouth daily for 7 days.     cholecalciferol (VITAMIN D3) 25 MCG (1000 UNIT) tablet  Take 1,000 Units by mouth in the morning.     Cyanocobalamin (VITAMIN B 12 PO) Take 1,000 mcg by mouth in the morning.     dorzolamide-timolol (COSOPT) 22.3-6.8 MG/ML ophthalmic solution Place 1 drop into the right eye 2 (two) times daily.     empagliflozin (JARDIANCE) 10 MG TABS tablet Take 1 tablet (10 mg total) by mouth daily. 30 tablet 11   escitalopram (LEXAPRO) 20 MG tablet Take 1 tablet (20 mg total) by mouth daily. (Patient taking differently: Take 20 mg by mouth every morning.) 90 tablet 0   Evolocumab (REPATHA SURECLICK) 140 MG/ML SOAJ INJECT 140MG  ( ) INTO SKIN EVERY 2 WEEKS 6 mL 3   Ferrous Sulfate (IRON PO) Take 9 mg by mouth in the morning. Chewable     finasteride (PROSCAR) 5 MG tablet Take 1 tablet (5 mg total) by mouth daily. (Patient taking differently: Take 5 mg by mouth at bedtime.) 30 tablet 2   FOLIC ACID PO Take 1 tablet by mouth daily.     gabapentin (NEURONTIN) 100 MG capsule Take 200 mg by mouth 2 (two) times daily.      latanoprost (XALATAN) 0.005 % ophthalmic solution Place 1 drop into the right eye daily at 12 noon.     levothyroxine (SYNTHROID) 137 MCG tablet TAKE ONE TABLET DAILY BEFORE BREAKFAST. must make appointment FOR refills 90 tablet 1   Polyethyl Glycol-Propyl Glycol 0.4-0.3 % SOLN Place 1 drop into the left eye in the morning and at bedtime. Systane     potassium chloride SA (KLOR-CON M) 20 MEQ tablet Take 1 tablet (20 mEq total) by mouth daily. 30 tablet 6   RHOPRESSA 0.02 % SOLN Place 1 drop into the right eye at bedtime.     tamsulosin (FLOMAX) 0.4 MG CAPS Take 0.4 mg by mouth  at bedtime.      torsemide (DEMADEX) 20 MG tablet Take 3 tablets (60 mg total) by mouth 2 (two) times daily. 180 tablet 6   XARELTO 15 MG TABS tablet TAKE ONE TABLET DAILY WITH SUPPER 90 tablet 1   No current facility-administered medications for this encounter.    Allergies  Allergen Reactions   Pravastatin Sodium Other (See Comments)    Joint and muscle pain   Azithromycin Diarrhea    ROS- All systems are reviewed and negative except as per the HPI above.  Physical Exam: Vitals:   10/23/23 1540  BP: (!) 84/66  Pulse: (!) 104  Weight: 92.7 kg  Height: 5\' 6"  (1.676 m)    GEN- The patient is well appearing, alert and oriented x 3 today.   Neck - no JVD or carotid bruit noted Lungs- Clear to ausculation bilaterally, normal work of breathing Heart- Irregular rate and rhythm, no murmurs, rubs or gallops, PMI not laterally displaced Extremities- no clubbing, cyanosis, or edema Skin - no rash or ecchymosis noted   Wt Readings from Last 3 Encounters:  10/23/23 92.7 kg  09/26/23 92.7 kg  09/16/23 90.3 kg    EKG today demonstrates Vent. rate 104 BPM PR interval * ms QRS duration 190 ms QT/QTcB 450/591 ms P-R-T axes * 227 38 Ventricular-paced rhythm Biventricular pacemaker detected Abnormal ECG When compared with ECG of 06-Sep-2023 09:31, PREVIOUS ECG IS PRESENT  Echo 08/22/23: 1. Left ventricular ejection fraction, by estimation, is 45 to 50%. The  left ventricle has mildly decreased function. The left ventricle  demonstrates global hypokinesis. There is mild concentric left ventricular  hypertrophy. Left ventricular diastolic  function could not  be evaluated.   2. Right ventricular systolic function is normal. The right ventricular  size is mildly enlarged. There is moderately elevated pulmonary artery  systolic pressure. The estimated right ventricular systolic pressure is  52.8 mmHg.   3. Left atrial size was severely dilated.   4. Right atrial size was  severely dilated.   5. The mitral valve has been repaired/replaced. There is a Mitra-Clip  present in the mitral position x 2      Mild mitral valve regurgitation. No evidence of mitral stenosis. The  mean mitral valve gradient is 4.0 mmHg.   6. The aortic valve is tricuspid. Aortic valve regurgitation is trivial.  Aortic valve sclerosis is present, with no evidence of aortic valve  stenosis.   7. The inferior vena cava is normal in size with greater than 50%  respiratory variability, suggesting right atrial pressure of 3 mmHg.   Epic records are reviewed at length today  CHA2DS2-VASc Score = 4  The patient's score is based upon: CHF History: 1 HTN History: 1 Diabetes History: 0 Stroke History: 0 Vascular Disease History: 0 Age Score: 2 Gender Score: 0       ASSESSMENT AND PLAN: 1. Persistent Atrial Fibrillation (ICD10:  I48.19) The patient's CHA2DS2-VASc score is 4 , indicating a  4.0% annual risk of stroke.   S/p afib ablation 11/03/19.  Patient is currently in Afib. Labs drawn 10/07/23. We discussed treatment options and after discussion patient feels unwell when out of rhythm. He would like to proceed with cardioversion.   Informed Consent   Shared Decision Making/Informed Consent The risks (stroke, cardiac arrhythmias rarely resulting in the need for a temporary or permanent pacemaker, skin irritation or burns and complications associated with conscious sedation including aspiration, arrhythmia, respiratory failure and death), benefits (restoration of normal sinus rhythm) and alternatives of a direct current cardioversion were explained in detail to Mr. Ligon and he agrees to proceed.       High risk medication monitoring (ICD10: R7229428) Patient requires ongoing monitoring for anti-arrhythmic medication which has the potential to cause life threatening arrhythmias or AV block.  Increase amiodarone to 200 mg BID. Typically, would do a longer load prior to cardioversion but  patient's hypotension when out of rhythm is something that can increase his chance for a fall. I hope his increase in amiodarone can help maintain normal rhythm post procedure. He will need to hold his Jardiance 3 days prior to DCCV.   2. Secondary Hypercoagulable State (ICD10:  D68.69) The patient is at significant risk for stroke/thromboembolism based upon his CHA2DS2-VASc Score of 4 .  Continue Rivaroxaban (Xarelto).  No missed doses per patient.   3. Chronic systolic CHF Weight stable.     Follow up 2 weeks after DCCV.   Lake Bells, PA-C Afib Clinic [facility] [address] [place], [place] [address] [phone] 10/23/2023 3:53 PM

## 2023-10-23 NOTE — Telephone Encounter (Signed)
Received call from patient.  He reports he is not feeling well especially yesterday 1/21.  For the past 3 days:   HR has been ranging from 90 -110 Feels weak and balance is off but no falls,  BP 90/50.    Pt is taking Amiodarone as prescribed, 200 mg daily and Xarelto.  Sent to Device clinic for review and follow up.    10/22/2023 Carelink report has the following: AT/AF  421 Time in AT/AF  19.4 hours/day (81.0 %) (compared to 09/11/23 report of <0.1 hr/day (0.3%))

## 2023-10-25 NOTE — Progress Notes (Signed)
Spoke to patient and instructed them to come at 0915  and to be NPO after 0000.  Medications reviewed.    Confirmed that patient will have a ride home and someone to stay with them for 24 hours after the procedure.

## 2023-10-28 ENCOUNTER — Ambulatory Visit (HOSPITAL_COMMUNITY): Payer: Medicare PPO | Admitting: Anesthesiology

## 2023-10-28 ENCOUNTER — Other Ambulatory Visit: Payer: Self-pay

## 2023-10-28 ENCOUNTER — Ambulatory Visit (HOSPITAL_COMMUNITY)
Admission: RE | Admit: 2023-10-28 | Discharge: 2023-10-28 | Disposition: A | Discharge status: Home / Self Care | Payer: Medicare PPO | Attending: Cardiology | Admitting: Cardiology

## 2023-10-28 ENCOUNTER — Encounter (HOSPITAL_COMMUNITY)
Admission: RE | Disposition: A | Discharge status: Home / Self Care | Payer: Self-pay | Source: Home / Self Care | Attending: Cardiology

## 2023-10-28 DIAGNOSIS — G4730 Sleep apnea, unspecified: Secondary | ICD-10-CM | POA: Insufficient documentation

## 2023-10-28 DIAGNOSIS — I2510 Atherosclerotic heart disease of native coronary artery without angina pectoris: Secondary | ICD-10-CM | POA: Insufficient documentation

## 2023-10-28 DIAGNOSIS — I4891 Unspecified atrial fibrillation: Secondary | ICD-10-CM | POA: Insufficient documentation

## 2023-10-28 DIAGNOSIS — Z539 Procedure and treatment not carried out, unspecified reason: Secondary | ICD-10-CM | POA: Diagnosis not present

## 2023-10-28 DIAGNOSIS — I480 Paroxysmal atrial fibrillation: Secondary | ICD-10-CM

## 2023-10-28 DIAGNOSIS — E039 Hypothyroidism, unspecified: Secondary | ICD-10-CM | POA: Insufficient documentation

## 2023-10-28 DIAGNOSIS — I110 Hypertensive heart disease with heart failure: Secondary | ICD-10-CM | POA: Diagnosis not present

## 2023-10-28 DIAGNOSIS — Z7901 Long term (current) use of anticoagulants: Secondary | ICD-10-CM | POA: Insufficient documentation

## 2023-10-28 DIAGNOSIS — Z79899 Other long term (current) drug therapy: Secondary | ICD-10-CM | POA: Insufficient documentation

## 2023-10-28 DIAGNOSIS — I509 Heart failure, unspecified: Secondary | ICD-10-CM | POA: Insufficient documentation

## 2023-10-28 DIAGNOSIS — Z87891 Personal history of nicotine dependence: Secondary | ICD-10-CM | POA: Insufficient documentation

## 2023-10-28 DIAGNOSIS — K219 Gastro-esophageal reflux disease without esophagitis: Secondary | ICD-10-CM | POA: Insufficient documentation

## 2023-10-28 DIAGNOSIS — I252 Old myocardial infarction: Secondary | ICD-10-CM | POA: Insufficient documentation

## 2023-10-28 SURGERY — CARDIOVERSION (CATH LAB)
Anesthesia: General

## 2023-10-28 NOTE — Anesthesia Preprocedure Evaluation (Addendum)
Anesthesia Evaluation  Patient identified by MRN, date of birth, ID band Patient awake    Reviewed: Allergy & Precautions, NPO status , Patient's Chart, lab work & pertinent test results  Airway Mallampati: II  TM Distance: >3 FB Neck ROM: Full    Dental  (+) Dental Advisory Given, Lower Dentures, Upper Dentures   Pulmonary sleep apnea , former smoker   Pulmonary exam normal breath sounds clear to auscultation       Cardiovascular hypertension, Pt. on medications + CAD, + Past MI, + Peripheral Vascular Disease and +CHF  + dysrhythmias Atrial Fibrillation + Cardiac Defibrillator + Valvular Problems/Murmurs (S/P mitral valve clip implantation)  Rhythm:Irregular Rate:Abnormal     Neuro/Psych  PSYCHIATRIC DISORDERS  Depression     Neuromuscular disease    GI/Hepatic Neg liver ROS,GERD  ,,  Endo/Other  Hypothyroidism  Obesity   Renal/GU negative Renal ROS     Musculoskeletal  (+) Arthritis ,    Abdominal   Peds  Hematology  (+) Blood dyscrasia (Xarelto)   Anesthesia Other Findings Day of surgery medications reviewed with the patient.  Reproductive/Obstetrics                              Anesthesia Physical Anesthesia Plan  ASA: 4  Anesthesia Plan: General   Post-op Pain Management: Minimal or no pain anticipated   Induction: Intravenous  PONV Risk Score and Plan: 2 and TIVA  Airway Management Planned: Natural Airway and Mask  Additional Equipment:   Intra-op Plan:   Post-operative Plan:   Informed Consent: I have reviewed the patients History and Physical, chart, labs and discussed the procedure including the risks, benefits and alternatives for the proposed anesthesia with the patient or authorized representative who has indicated his/her understanding and acceptance.     Dental advisory given  Plan Discussed with: CRNA  Anesthesia Plan Comments:         Anesthesia  Quick Evaluation

## 2023-10-28 NOTE — Progress Notes (Signed)
Patient presented for cardioversion. He was found to be in atrial synchronous ventricular paced rhythm.  This was also confirmed with [employer]. No procedure performed he was discharged to home.

## 2023-10-28 NOTE — H&P (Signed)
In sinus rhythm no procedure done

## 2023-10-29 DIAGNOSIS — E785 Hyperlipidemia, unspecified: Secondary | ICD-10-CM | POA: Diagnosis not present

## 2023-10-29 DIAGNOSIS — D649 Anemia, unspecified: Secondary | ICD-10-CM | POA: Diagnosis not present

## 2023-10-29 DIAGNOSIS — E039 Hypothyroidism, unspecified: Secondary | ICD-10-CM | POA: Diagnosis not present

## 2023-11-04 ENCOUNTER — Encounter: Payer: Self-pay | Admitting: Internal Medicine

## 2023-11-06 ENCOUNTER — Other Ambulatory Visit (HOSPITAL_COMMUNITY)
Admission: RE | Admit: 2023-11-06 | Discharge: 2023-11-06 | Disposition: A | Discharge status: Home / Self Care | Payer: Medicare PPO | Source: Ambulatory Visit | Attending: "Endocrinology | Admitting: "Endocrinology

## 2023-11-06 ENCOUNTER — Ambulatory Visit (HOSPITAL_COMMUNITY)
Admission: RE | Admit: 2023-11-06 | Discharge: 2023-11-06 | Disposition: A | Discharge status: Home / Self Care | Payer: Medicare PPO | Source: Ambulatory Visit | Attending: "Endocrinology | Admitting: "Endocrinology

## 2023-11-06 DIAGNOSIS — C73 Malignant neoplasm of thyroid gland: Secondary | ICD-10-CM | POA: Insufficient documentation

## 2023-11-06 LAB — TSH: TSH: 0.288 u[IU]/mL — ABNORMAL LOW (ref 0.350–4.500)

## 2023-11-06 LAB — T4, FREE: Free T4: 1.18 ng/dL — ABNORMAL HIGH (ref 0.61–1.12)

## 2023-11-07 LAB — THYROGLOBULIN ANTIBODY: Thyroglobulin Antibody: <1 [IU]/mL (ref 0.0–0.9)

## 2023-11-11 ENCOUNTER — Encounter: Payer: Self-pay | Admitting: "Endocrinology

## 2023-11-11 ENCOUNTER — Ambulatory Visit: Payer: Medicare PPO | Admitting: "Endocrinology

## 2023-11-11 VITALS — BP 102/70 | HR 80 | Ht 66.0 in | Wt 208.0 lb

## 2023-11-11 DIAGNOSIS — C73 Malignant neoplasm of thyroid gland: Secondary | ICD-10-CM | POA: Diagnosis not present

## 2023-11-11 DIAGNOSIS — E890 Postprocedural hypothyroidism: Secondary | ICD-10-CM | POA: Diagnosis not present

## 2023-11-11 NOTE — Progress Notes (Signed)
 11/11/2023, 12:47 PM   Endocrinology follow-up note  Subjective:    Patient ID: Chad Brown, male    DOB: 04/17/1941, PCP Jeannine Milroy., MD   Past Medical History:  Diagnosis Date   Adenomatous colon polyp    tubular   AICD (automatic cardioverter/defibrillator) present 03/08/2015   MDT CRTD dual pacemaker and defib   Anemia    iron  deficient   Arthritis    "about all my joints; hands, knees, back" (03/08/2015)   Atherosclerosis    Cataract    left eye small   Cholelithiasis    gallstones   Chronic systolic CHF (congestive heart failure) (HCC)    a. New dx 12/2012 ? NICM, may be r/t afib. b. Nuc 03/2013 - normal;  c. 03/2015 TEE EF 15-20%.   Colon polyp, hyperplastic 01/2015   removed precancerous lesions   Depression    Diverticulosis    Dysrhythmia    afib   GERD (gastroesophageal reflux disease)    Glaucoma    right eye   Hyperlipidemia    Hypertension    Melanoma of eye (HCC) 2000's   "right; it's never been biopsied"   Melanoma of lower leg (HCC) 2015   "left; right at my knee"   Myocardial infarction (HCC) 1998   OSA (obstructive sleep apnea) 01/04/2016   no longer tolerates cpap   Peripheral vision loss, right 2006   Persistent atrial fibrillation (HCC)    a. Dx 12/2012, s/p TEE/DCCV 01/26/13. b. On Xarelto  (CHA2DS2VASc = 3);  c. 03/2015 TEE (EF 15-20%, no LAA thrombus) and DCCV - amio increased to 200 mg bid.   Pneumonia    S/P mitral valve clip implantation 08/21/2023   Placement of 1 XTW in the A2/P2 position and 1 NTW in the A1/P1 position by Dr. Lorie Rook and Dr. Arlester Ladd   Urinary hesitancy due to benign prostatic hypertrophy    Past Surgical History:  Procedure Laterality Date   ATRIAL FIBRILLATION ABLATION N/A 11/03/2019   Procedure: ATRIAL FIBRILLATION ABLATION;  Surgeon: Jolly Needle, MD;  Location: [facility] INVASIVE CV LAB;  Service: Cardiovascular;  Laterality: N/A;   BACK SURGERY     upper back,  cannot turn neck well   BIV ICD GENERATOR CHANGEOUT N/A 09/16/2023   Procedure: BIV ICD GENERATOR CHANGEOUT;  Surgeon: Lei Pump, MD;  Location: [facility] INVASIVE CV LAB;  Service: Cardiovascular;  Laterality: N/A;   CARDIAC CATHETERIZATION  1998   CARDIOVERSION N/A 01/26/2013   Procedure: CARDIOVERSION;  Surgeon: Lenise Quince, MD;  Location: [facility] ENDOSCOPY;  Service: Cardiovascular;  Laterality: N/A;   CARDIOVERSION N/A 03/23/2015   Procedure: CARDIOVERSION;  Surgeon: Hugh Madura, MD;  Location: [facility] ENDOSCOPY;  Service: Cardiovascular;  Laterality: N/A;   CARDIOVERSION N/A 08/14/2017   Procedure: CARDIOVERSION;  Surgeon: Loyde Rule, MD;  Location: [facility] ENDOSCOPY;  Service: Cardiovascular;  Laterality: N/A;   CATARACT EXTRACTION Right ~ 2006   COLONOSCOPY WITH PROPOFOL  N/A 02/10/2015   Procedure: COLONOSCOPY WITH PROPOFOL ;  Surgeon: Kenney Peacemaker, MD;  Location: [facility] ENDOSCOPY;  Service: Endoscopy;  Laterality: N/A;   COLONOSCOPY WITH PROPOFOL  N/A 08/07/2016   Procedure: COLONOSCOPY WITH PROPOFOL ;  Surgeon: Kenney Peacemaker, MD;  Location: [facility] ENDOSCOPY;  Service: Endoscopy;  Laterality: N/A;   ENTEROSCOPY N/A 08/17/2015   Procedure: ENTEROSCOPY;  Surgeon: Kenney Peacemaker, MD;  Location: [facility] ENDOSCOPY;  Service: Endoscopy;  Laterality: N/A;   EP IMPLANTABLE DEVICE N/A 03/08/2015   MDT Kenna Payor CRT-D for nonischemic CM by Dr Nunzio Belch for primary prevention   GLAUCOMA SURGERY Right ~ 2006   "put 3 stents in to drain fluid" (03/08/2015) not successful, sent to [facility] to try to get last stent out   HOT HEMOSTASIS N/A 08/07/2016   Procedure: HOT HEMOSTASIS (ARGON PLASMA COAGULATION/BICAP);  Surgeon: Kenney Peacemaker, MD;  Location: [facility] ENDOSCOPY;  Service: Endoscopy;  Laterality: N/A;   INCISION AND DRAINAGE ABSCESS POSTERIOR CERVICALSPINE  05/2012   JOINT REPLACEMENT     MELANOMA EXCISION Left 2015   "lower leg; right at my knee"   REFRACTIVE SURGERY Right ~ 2006 X 2   "twice; both done at [facility]"  (03/08/2015   RIGHT HEART CATH N/A 08/05/2023   Procedure: RIGHT HEART CATH;  Surgeon: Darlis Eisenmenger, MD;  Location: [facility] INVASIVE CV LAB;  Service: Cardiovascular;  Laterality: N/A;   SURGERY SCROTAL / TESTICULAR Right 1990's   TEE WITHOUT CARDIOVERSION N/A 01/26/2013   Procedure: TRANSESOPHAGEAL ECHOCARDIOGRAM (TEE);  Surgeon: Lenise Quince, MD;  Location: [facility] ENDOSCOPY;  Service: Cardiovascular;  Laterality: N/A;  [name] anes. /    TEE WITHOUT CARDIOVERSION N/A 10/05/2014   Procedure: TRANSESOPHAGEAL ECHOCARDIOGRAM (TEE)  with cardioversion;  Surgeon: Lake Pilgrim, MD;  Location: [facility] ENDOSCOPY;  Service: Cardiovascular;  Laterality: N/A;  12:52 synched cardioversion at 120 joules,...afib to SR...12 lead EKG ordered.Aaron AasAaron AasCardiozem d/c'ed per MD verbal order at SR   TEE WITHOUT CARDIOVERSION N/A 03/23/2015   Procedure: TRANSESOPHAGEAL ECHOCARDIOGRAM (TEE);  Surgeon: Hugh Madura, MD;  Location: [facility] ENDOSCOPY;  Service: Cardiovascular;  Laterality: N/A;   TEE WITHOUT CARDIOVERSION N/A 11/02/2019   Procedure: TRANSESOPHAGEAL ECHOCARDIOGRAM (TEE);  Surgeon: Loyde Rule, MD;  Location: [facility] ENDOSCOPY;  Service: Cardiovascular;  Laterality: N/A;   TEE WITHOUT CARDIOVERSION N/A 08/05/2023   Procedure: TRANSESOPHAGEAL ECHOCARDIOGRAM;  Surgeon: Darlis Eisenmenger, MD;  Location: [facility] INVASIVE CV LAB;  Service: Cardiovascular;  Laterality: N/A;   THORACIC SPINE SURGERY  03/2000   "ground calcium  deposits from upper thoracic" (01/26/2013)   THYROIDECTOMY N/A 05/11/2021   Procedure: TOTAL THYROIDECTOMY;  Surgeon: Oralee Billow, MD;  Location: [facility] ORS;  Service: General;  Laterality: N/A;   TOTAL HIP ARTHROPLASTY Right 06/2007   TRANSCATHETER MITRAL EDGE TO EDGE REPAIR N/A 08/21/2023   Procedure: TRANSCATHETER MITRAL EDGE TO EDGE REPAIR;  Surgeon: Thukkani, Arun K, MD;  Location: [facility] INVASIVE CV LAB;  Service: Cardiovascular;  Laterality: N/A;   TRANSESOPHAGEAL ECHOCARDIOGRAM (CATH LAB) N/A 08/09/2023   Procedure:  TRANSESOPHAGEAL ECHOCARDIOGRAM;  Surgeon: Darlis Eisenmenger, MD;  Location: [facility] INVASIVE CV LAB;  Service: Cardiovascular;  Laterality: N/A;   TRANSESOPHAGEAL ECHOCARDIOGRAM (CATH LAB) N/A 08/21/2023   Procedure: TRANSESOPHAGEAL ECHOCARDIOGRAM;  Surgeon: Kyra Phy, MD;  Location: [facility] INVASIVE CV LAB;  Service: Cardiovascular;  Laterality: N/A;   Social History   Socioeconomic History   Marital status: Married    Spouse name: Not on file   Number of children: 3   Years of education: Not on file   Highest education level: Not on file  Occupational History   Occupation: retired  Tobacco Use   Smoking status: Former    Current packs/day: 0.00    Average packs/day: 3.0 packs/day for 48.0 years (144.0 ttl pk-yrs)    Types: Cigarettes  Start date: 09/28/1952    Quit date: 09/28/2000    Years since quitting: 23.1   Smokeless tobacco: Never   Tobacco comments:    Former smoker 09/06/23  Vaping Use   Vaping status: Never Used  Substance and Sexual Activity   Alcohol  use: No   Drug use: No   Sexual activity: Not Currently  Other Topics Concern   Not on file  Social History Narrative   Pt lives in [place] with spouse.  3 children are grown and healthy.   Retired.  Ran a country store for 30 years, previously worked in the [employer] for 16 years   Social Drivers of [employer]: Not on [employer] Insecurity: No Food Insecurity (08/20/2023)   Hunger Vital Sign    Worried About Running Out of Food in the Last Year: Never true    Ran Out of Food in the Last Year: Never true  Transportation Needs: No Transportation Needs (08/20/2023)   PRAPARE - Administrator, Civil Service (Medical): No    Lack of Transportation (Non-Medical): No  Physical Activity: Inactive (11/03/2020)   Exercise Vital Sign    Days of Exercise per Week: 0 days    Minutes of Exercise per Session: 0 min  Stress: Not on file  Social Connections: Not on file    Family History  Problem Relation Age of Onset   Kidney disease Mother    Heart disease Mother        MI, open heart   Diabetes Mother        dialysis   Leukemia Father    Colon cancer Paternal Uncle    Lung cancer Paternal Uncle        x 2   Prostate cancer Paternal Uncle    Diabetes Maternal Grandmother    Heart attack Maternal Uncle    Diabetes Maternal Aunt        x 3   Diabetes Maternal Uncle    Thyroid  disease Sister    Outpatient Encounter Medications as of 11/11/2023  Medication Sig   calcium  carbonate (TUMS EX) 750 MG chewable tablet Chew 1 tablet by mouth 2 (two) times daily. 1 with lunch and 1 with supper   acetaminophen  (TYLENOL ) 500 MG tablet Take 1,000 mg by mouth 2 (two) times daily.   amiodarone  (PACERONE ) 200 MG tablet Take 1 tablet (200 mg total) by mouth 2 (two) times daily for 21 days, THEN 1 tablet (200 mg total) daily.   cholecalciferol (VITAMIN D3) 25 MCG (1000 UNIT) tablet Take 1,000 Units by mouth in the morning.   Cyanocobalamin  (VITAMIN B 12 PO) Take 1,000 mcg by mouth in the morning.   dorzolamide -timolol  (COSOPT ) 22.3-6.8 MG/ML ophthalmic solution Place 1 drop into the right eye 2 (two) times daily.   empagliflozin  (JARDIANCE ) 10 MG TABS tablet Take 1 tablet (10 mg total) by mouth daily.   escitalopram  (LEXAPRO ) 20 MG tablet Take 1 tablet (20 mg total) by mouth daily. (Patient taking differently: Take 20 mg by mouth every morning.)   Evolocumab  (REPATHA  SURECLICK) 140 MG/ML SOAJ INJECT 140MG  ( ) INTO SKIN EVERY 2 WEEKS   Ferrous Sulfate  (IRON  PO) Take 9 mg by mouth in the morning. Chewable   finasteride  (PROSCAR ) 5 MG tablet Take 1 tablet (5 mg total) by mouth daily. (Patient taking differently: Take 5 mg by mouth at bedtime.)   FOLIC ACID  PO Take 1 tablet by mouth daily.   gabapentin  (NEURONTIN ) 100  MG capsule Take 200 mg by mouth 2 (two) times daily.    latanoprost  (XALATAN ) 0.005 % ophthalmic solution Place 1 drop into the right eye daily at 12  noon.   levothyroxine  (SYNTHROID ) 137 MCG tablet TAKE ONE TABLET DAILY BEFORE BREAKFAST. must make appointment FOR refills   Polyethyl Glycol-Propyl Glycol 0.4-0.3 % SOLN Place 1 drop into the left eye in the morning and at bedtime. Systane   potassium chloride  SA (KLOR-CON  M) 20 MEQ tablet Take 1 tablet (20 mEq total) by mouth daily.   RHOPRESSA  0.02 % SOLN Place 1 drop into the right eye at bedtime.   tamsulosin  (FLOMAX ) 0.4 MG CAPS Take 0.4 mg by mouth at bedtime.    torsemide  (DEMADEX ) 20 MG tablet Take 3 tablets (60 mg total) by mouth 2 (two) times daily.   XARELTO  15 MG TABS tablet TAKE ONE TABLET DAILY WITH SUPPER   No facility-administered encounter medications on file as of 11/11/2023.   ALLERGIES: Allergies  Allergen Reactions   Pravastatin  Sodium Other (See Comments)    Joint and muscle pain   Azithromycin  Diarrhea    VACCINATION STATUS: Immunization History  Administered Date(s) Administered   Fluad Quad(high Dose 65+) 06/10/2019   Fluad Trivalent(High Dose 65+) 07/19/2023   Influenza Inj Mdck Quad With Preservative 06/27/2018   Influenza, High Dose Seasonal PF 06/29/2016, 07/04/2017, 06/29/2020   Influenza, Seasonal, Injecte, Preservative Fre 08/02/2015   Influenza-Unspecified 07/11/2014, 07/18/2015, 08/02/2015   Moderna Sars-Covid-2 Vaccination 12/08/2019, 12/28/2019, 07/27/2020   Pneumococcal Conjugate-13 08/23/2014   Pneumococcal Polysaccharide-23 10/01/2010, 03/04/2018   Pneumococcal-Unspecified 06/02/2011   Td 10/01/2008   Td (Adult), 2 Lf Tetanus Toxid, Preservative Free 10/01/2008   Tdap 12/16/2014   Zoster Recombinant(Shingrix) 12/24/2017, 12/27/2017, 03/04/2018   Zoster, Live 10/01/2010, 08/01/2013, 08/20/2013    HPI RAGHAV COCKERILL is 83 y.o. male who presents today with a medical history as above. he is being seen in follow-up after he was seen in consultation for postsurgical hypothyroidism and history of thyroid  malignancy requested by Jeannine Milroy., MD.  Patient is accompanied by his wife to clinic. History is obtained from the patient as well as chart review. Accordingly, patient is status post total thyroidectomy on May 11, 2021 after workup showed papillary thyroid  malignancy.  This was subsequently followed by Thyrogen  stimulated thyroid  remnant ablation with negative whole body scan completed on October 27, 2021. His previsit thyroid  ultrasound on November 06, 2023 is unremarkable for tumor recurrence or remnant. He has recovered from his surgery completely.   Patient has been on regular dose of levothyroxine  ever since.   He is on levothyroxine  137 mcg p.o. daily before breakfast with good consistency and compliance.  His previsit thyroid  function tests are consistent with appropriate replacement-see below.-He has no new complaints today.  He has not taken his calcium  in most recent weeks, CMP showing mild hypocalcemia. He denies family history of thyroid  dysfunction or thyroid  malignancy.  He denies any prior history of thyroid  radiation exposure.  He denies dysphagia, shortness of breath, nor voice change.  He denies palpitations, tremors, nor heat/cold intolerance.  His other medical problems include hypertension, hyperlipidemia, coronary artery disease, CHF, on medications management.  His most recent hemoglobin A1c is 5.7% on November 01, 2022.    Review of Systems  Constitutional: + Minimally fluctuating body weight, no fatigue, no subjective hyperthermia, no subjective hypothermia Eyes: no blurry vision, no xerophthalmia ENT: no sore throat, no nodules palpated in throat, no dysphagia/odynophagia, no hoarseness   Objective:  11/11/2023    9:46 AM 10/28/2023    9:12 AM 10/23/2023    3:40 PM  Vitals with BMI  Height 5\' 6"   5\' 6"   Weight 208 lbs  204 lbs 6 oz  BMI 33.59  33.01  Systolic 102 103 84  Diastolic 70 68 66  Pulse 80 80 104    BP 102/70   Pulse 80   Ht 5\' 6"  (1.676 m)   Wt 208 lb (94.3 kg)    BMI 33.57 kg/m   Wt Readings from Last 3 Encounters:  11/11/23 208 lb (94.3 kg)  10/23/23 204 lb 6.4 oz (92.7 kg)  09/26/23 204 lb 6.4 oz (92.7 kg)    Physical Exam  Constitutional:  Body mass index is 33.57 kg/m.,  not in acute distress, normal state of mind Eyes: PERRLA, EOMI, no exophthalmos ENT: moist mucous membranes, + healed post thyroidectomy scar on anterior lower neck,  no gross cervical lymphadenopathy Cardiovascular: normal precordial activity, Regular Rate and Rhythm, no Murmur/Rubs/Gallops   CMP ( most recent) CMP     Component Value Date/Time   NA 141 10/07/2023 1518   NA 144 03/30/2020 0911   K 4.6 10/07/2023 1518   CL 101 10/07/2023 1518   CO2 32 10/07/2023 1518   GLUCOSE 92 10/07/2023 1518   BUN 42 (H) 10/07/2023 1518   BUN 19 03/30/2020 0911   CREATININE 2.42 (H) 10/07/2023 1518   CALCIUM  8.7 (L) 10/07/2023 1518   PROT 7.3 10/07/2023 1518   ALBUMIN 3.6 10/07/2023 1518   AST 16 10/07/2023 1518   ALT 23 10/07/2023 1518   ALKPHOS 72 10/07/2023 1518   BILITOT 0.5 10/07/2023 1518   GFRNONAA 26 (L) 10/07/2023 1518   GFRAA 38 (L) 04/29/2020 1034     Diabetic Labs (most recent): Lab Results  Component Value Date   HGBA1C 5.7 11/02/2022   HGBA1C 5.9 (H) 05/01/2021   HGBA1C 6.5 (H) 10/02/2014     Lipid Panel ( most recent) Lipid Panel     Component Value Date/Time   CHOL 103 07/17/2023 1227   TRIG 155 (H) 07/17/2023 1227   HDL 28 (L) 07/17/2023 1227   CHOLHDL 3.7 07/17/2023 1227   VLDL 31 07/17/2023 1227   LDLCALC 44 07/17/2023 1227      Lab Results  Component Value Date   TSH 0.288 (L) 11/06/2023   TSH 0.700 08/05/2023   TSH 1.574 07/17/2023   TSH 1.998 05/09/2023   TSH 3.15 11/01/2022   TSH 9.19 (H) 08/02/2021   TSH 3.441 10/27/2019   TSH 3.135 10/22/2018   TSH 3.620 07/22/2018   TSH 1.746 08/21/2017   FREET4 1.18 (H) 11/06/2023   FREET4 0.91 05/09/2023   FREET4 0.64 08/02/2021   FREET4 0.93 10/29/2014     Assessment & Plan:    1. Papillary thyroid  carcinoma (HCC) 2. Postsurgical hypothyroidism 3.  Hypocalcemia    ZAEDIN BASHA  is being seen at a kind request of Bernetta Brilliant Gerarda Knights., MD. - I have reviewed his new and available thyroid  records and clinically evaluated the patient. - Based on these reviews, he has history of papillary thyroid  carcinoma status post total thyroidectomy, Thyrogen  stimulated thyroid  remnant ablation with negative whole-body scan completed prior to January 2024. His recent thyroid  function tests are consistent with appropriate suppressive treatment in light of his cancer history.  He is advised to continue levothyroxine  137 mcg p.o. daily before breakfast.    - We discussed about the correct intake of his thyroid   hormone, on empty stomach at fasting, with water, separated by at least 30 minutes from breakfast and other medications,  and separated by more than 4 hours from calcium , iron , multivitamins, acid reflux medications (PPIs). -Patient is made aware of the fact that thyroid  hormone replacement is needed for life, dose to be adjusted by periodic monitoring of thyroid  function tests.  He presents with mild hypocalcemia.  He will continue to benefit from his calcium  carbonate 1000 mg p.o. twice daily.  I had a long discussion with him about the need for continued surveillance for cancer recurrence.  He will be considered for surveillance thyroid /neck ultrasound before his next visit in 6 months.    He is advised to continue regular follow-up with his PMD for his controlled diabetes, hypertension and hyperlipidemia.   - he is advised to maintain close follow up with Jeannine Milroy., MD for primary care needs.   I spent  21  minutes in the care of the patient today including review of labs from Thyroid  Function, CMP, and other relevant labs ; imaging/biopsy records (current and previous including abstractions from other facilities); face-to-face time discussing  his lab results  and symptoms, medications doses, his options of short and long term treatment based on the latest standards of care / guidelines;   and documenting the encounter.  Chad Brown  participated in the discussions, expressed understanding, and voiced agreement with the above plans.  All questions were answered to his satisfaction. he is encouraged to contact clinic should he have any questions or concerns prior to his return visit.   Follow up plan: Return in about 6 months (around 05/10/2024) for F/U with Pre-visit Labs.   Kalvin Orf, MD [facility] [address] [place], [place] [address] Phone: [phone]  Fax: [phone]     11/11/2023, 12:47 PM  This note was partially dictated with voice recognition software. Similar sounding words can be transcribed inadequately or may not  be corrected upon review.

## 2023-11-12 ENCOUNTER — Ambulatory Visit (HOSPITAL_COMMUNITY)
Admission: RE | Admit: 2023-11-12 | Discharge: 2023-11-12 | Disposition: A | Discharge status: Home / Self Care | Payer: Medicare PPO | Source: Ambulatory Visit | Attending: Internal Medicine | Admitting: Internal Medicine

## 2023-11-12 VITALS — BP 112/76 | HR 85 | Ht 66.0 in | Wt 209.4 lb

## 2023-11-12 DIAGNOSIS — I110 Hypertensive heart disease with heart failure: Secondary | ICD-10-CM | POA: Diagnosis not present

## 2023-11-12 DIAGNOSIS — Z7901 Long term (current) use of anticoagulants: Secondary | ICD-10-CM | POA: Insufficient documentation

## 2023-11-12 DIAGNOSIS — D6869 Other thrombophilia: Secondary | ICD-10-CM

## 2023-11-12 DIAGNOSIS — Z5181 Encounter for therapeutic drug level monitoring: Secondary | ICD-10-CM | POA: Insufficient documentation

## 2023-11-12 DIAGNOSIS — I4819 Other persistent atrial fibrillation: Secondary | ICD-10-CM | POA: Diagnosis not present

## 2023-11-12 DIAGNOSIS — Z79899 Other long term (current) drug therapy: Secondary | ICD-10-CM | POA: Insufficient documentation

## 2023-11-12 DIAGNOSIS — I5022 Chronic systolic (congestive) heart failure: Secondary | ICD-10-CM | POA: Diagnosis not present

## 2023-11-12 NOTE — Patient Instructions (Addendum)
    Please start taking amiodarone 200 mg once daily on Thursday, February 13th.   Thank you

## 2023-11-12 NOTE — Progress Notes (Signed)
Primary Care Physician: Cleatis Polka., MD Primary Cardiologist: [facility]: Dr Shirlee Latch Primary Electrophysiologist: Dr Elberta Fortis Referring Physician: Dr Christie Nottingham is a 83 y.o. male with a history of persistent atrial fibrillation, systolic CHF, CKD, OSA, HLD, who presents for follow up in the [facility].  The patient was initially diagnosed with atrial fibrillation 12/2012. Patient is on Xarelto for a CHADS2VASC score of 4. Echo showed EF 30-35%.  He had DCCV back to NSR.  He was back in atrial fibrillation in 1/16, and EF was low again on TEE at that time and he had DCCV.  In 5/16, cardiac MRI showed persistently low EF, so given LBBB, he had [employer] CRT-D device placed.  He was back in atrial fibrillation in 6/16 and had TEE-guided DCCV. Patient noted to be back in afib 10/2019 despite amiodarone therapy. He underwent afib ablation with Dr Johney Frame on 11/03/19. Patient reports that he has done well since the procedure. His weight has been stable and no lower extremity edema. Patient does report that he fell about 3 weeks ago and hit the side of his face on the trash can. He did not seek evaluation for this. No presyncope, loss of consciousness, or residual symptoms. He denies chest pain, swallowing, or groin issues.  On follow up 5/8, he is currently in SR today. Recent review of device on 4/29 showed 30 episodes of AF with 6% burden. Patient notes that when he was in episodes would feel anxious, weak, and dizzy. No shortness of breath or chest pain with episodes.   On follow up 09/06/23, he is currently in AV paced rhythm. Admission for diuresis/volume optimization secondary to acute on chronic HF with reduced EF 11/4-8/24. Admission 11/19-21/24 for severe mitral regurgitation s/p transcatheter edge to edge mitral valve repair. He was noted to be in rapid Afib at admission and started on IV amiodarone. Discharged on amiodarone 200 mg TID then to 200 mg  BID. He has noted no episodes of Afib since hospital discharge on 11/21. He feels better since mitral valve repair, notes to be a little less SOB, and has a little bit more energy. He is currently taking amiodarone 200 mg BID. No missed doses of Xarelto 15 mg daily. He shows me a log of daily weights and for December has been in between 204.4 and 205.6 lbs (today 205 lbs).   On follow up 10/23/23, he is currently in Afib. Patient contacted office today noting he has not felt well the past 3 days; device transmission showed Afib with controlled VR. He notes to feel weak and dizzy at times when out of rhythm. No missed doses of Xarelto.   On follow up 11/12/23, he is currently in AV paced rhythm. He was seen on 1/22 noted to be in Afib and amiodarone was increased to 200 mg BID. He was scheduled for cardioversion on 1/27 and noted to be in AV paced rhythm so procedure was canceled. He is currently taking amiodarone 200 mg BID. He feels better in normal rhythm. No missed doses of Xarelto 15 mg daily.   Today, he denies symptoms of palpitations, orthopnea, PND, lower extremity edema, presyncope, syncope, snoring, daytime somnolence, bleeding, or neurologic sequela. The patient is tolerating medications without difficulties and is otherwise without complaint today.    Atrial Fibrillation Risk Factors:  he does have symptoms or diagnosis of sleep apnea. he is compliant with CPAP therapy. he does not have a history  of rheumatic fever.  he has a BMI of Body mass index is 33.8 kg/m.[name] Filed Weights   11/12/23 1458  Weight: 95 kg     Family History  Problem Relation Age of Onset   Kidney disease Mother    Heart disease Mother        MI, open heart   Diabetes Mother        dialysis   Leukemia Father    Colon cancer Paternal Uncle    Lung cancer Paternal Uncle        x 2   Prostate cancer Paternal Uncle    Diabetes Maternal Grandmother    Heart attack Maternal Uncle    Diabetes Maternal Aunt         x 3   Diabetes Maternal Uncle    Thyroid disease Sister     Atrial Fibrillation Management history:  Previous antiarrhythmic drugs: amiodarone Previous cardioversions: several, most recently 2018 Previous ablations: 11/03/19 CHADS2VASC score: 4 Anticoagulation history: Xarelto   Past Medical History:  Diagnosis Date   Adenomatous colon polyp    tubular   AICD (automatic cardioverter/defibrillator) present 03/08/2015   MDT CRTD dual pacemaker and defib   Anemia    iron deficient   Arthritis    "about all my joints; hands, knees, back" (03/08/2015)   Atherosclerosis    Cataract    left eye small   Cholelithiasis    gallstones   Chronic systolic CHF (congestive heart failure) (HCC)    a. New dx 12/2012 ? NICM, may be r/t afib. b. Nuc 03/2013 - normal;  c. 03/2015 TEE EF 15-20%.   Colon polyp, hyperplastic 01/2015   removed precancerous lesions   Depression    Diverticulosis    Dysrhythmia    afib   GERD (gastroesophageal reflux disease)    Glaucoma    right eye   Hyperlipidemia    Hypertension    Melanoma of eye (HCC) 2000's   "right; it's never been biopsied"   Melanoma of lower leg (HCC) 2015   "left; right at my knee"   Myocardial infarction (HCC) 1998   OSA (obstructive sleep apnea) 01/04/2016   no longer tolerates cpap   Peripheral vision loss, right 2006   Persistent atrial fibrillation (HCC)    a. Dx 12/2012, s/p TEE/DCCV 01/26/13. b. On Xarelto (CHA2DS2VASc = 3);  c. 03/2015 TEE (EF 15-20%, no LAA thrombus) and DCCV - amio increased to 200 mg bid.   Pneumonia    S/P mitral valve clip implantation 08/21/2023   Placement of 1 XTW in the A2/P2 position and 1 NTW in the A1/P1 position by Dr. Lynnette Caffey and Dr. Excell Seltzer   Urinary hesitancy due to benign prostatic hypertrophy    Past Surgical History:  Procedure Laterality Date   ATRIAL FIBRILLATION ABLATION N/A 11/03/2019   Procedure: ATRIAL FIBRILLATION ABLATION;  Surgeon: Hillis Range, MD;  Location: [facility] INVASIVE  CV LAB;  Service: Cardiovascular;  Laterality: N/A;   BACK SURGERY     upper back, cannot turn neck well   BIV ICD GENERATOR CHANGEOUT N/A 09/16/2023   Procedure: BIV ICD GENERATOR CHANGEOUT;  Surgeon: Regan Lemming, MD;  Location: [facility] INVASIVE CV LAB;  Service: Cardiovascular;  Laterality: N/A;   CARDIAC CATHETERIZATION  1998   CARDIOVERSION N/A 01/26/2013   Procedure: CARDIOVERSION;  Surgeon: Lewayne Bunting, MD;  Location: [facility] ENDOSCOPY;  Service: Cardiovascular;  Laterality: N/A;   CARDIOVERSION N/A 03/23/2015   Procedure: CARDIOVERSION;  Surgeon: Jake Bathe, MD;  Location: [facility] ENDOSCOPY;  Service: Cardiovascular;  Laterality: N/A;   CARDIOVERSION N/A 08/14/2017   Procedure: CARDIOVERSION;  Surgeon: Wendall Stade, MD;  Location: [facility] ENDOSCOPY;  Service: Cardiovascular;  Laterality: N/A;   CATARACT EXTRACTION Right ~ 2006   COLONOSCOPY WITH PROPOFOL N/A 02/10/2015   Procedure: COLONOSCOPY WITH PROPOFOL;  Surgeon: Iva Boop, MD;  Location: [facility] ENDOSCOPY;  Service: Endoscopy;  Laterality: N/A;   COLONOSCOPY WITH PROPOFOL N/A 08/07/2016   Procedure: COLONOSCOPY WITH PROPOFOL;  Surgeon: Iva Boop, MD;  Location: [facility] ENDOSCOPY;  Service: Endoscopy;  Laterality: N/A;   ENTEROSCOPY N/A 08/17/2015   Procedure: ENTEROSCOPY;  Surgeon: Iva Boop, MD;  Location: [facility] ENDOSCOPY;  Service: Endoscopy;  Laterality: N/A;   EP IMPLANTABLE DEVICE N/A 03/08/2015   MDT Ovidio Kin CRT-D for nonischemic CM by Dr Johney Frame for primary prevention   GLAUCOMA SURGERY Right ~ 2006   "put 3 stents in to drain fluid" (03/08/2015) not successful, sent to [facility] to try to get last stent out   HOT HEMOSTASIS N/A 08/07/2016   Procedure: HOT HEMOSTASIS (ARGON PLASMA COAGULATION/BICAP);  Surgeon: Iva Boop, MD;  Location: [facility] ENDOSCOPY;  Service: Endoscopy;  Laterality: N/A;   INCISION AND DRAINAGE ABSCESS POSTERIOR CERVICALSPINE  05/2012   JOINT REPLACEMENT     MELANOMA EXCISION Left 2015   "lower leg; right at  my knee"   REFRACTIVE SURGERY Right ~ 2006 X 2   "twice; both done at [facility]" (03/08/2015   RIGHT HEART CATH N/A 08/05/2023   Procedure: RIGHT HEART CATH;  Surgeon: Laurey Morale, MD;  Location: [facility] INVASIVE CV LAB;  Service: Cardiovascular;  Laterality: N/A;   SURGERY SCROTAL / TESTICULAR Right 1990's   TEE WITHOUT CARDIOVERSION N/A 01/26/2013   Procedure: TRANSESOPHAGEAL ECHOCARDIOGRAM (TEE);  Surgeon: Lewayne Bunting, MD;  Location: [facility] ENDOSCOPY;  Service: Cardiovascular;  Laterality: N/A;  [name] anes. /    TEE WITHOUT CARDIOVERSION N/A 10/05/2014   Procedure: TRANSESOPHAGEAL ECHOCARDIOGRAM (TEE)  with cardioversion;  Surgeon: Vesta Mixer, MD;  Location: [facility] ENDOSCOPY;  Service: Cardiovascular;  Laterality: N/A;  12:52 synched cardioversion at 120 joules,...afib to SR...12 lead EKG ordered.Marland KitchenMarland KitchenCardiozem d/c'ed per MD verbal order at SR   TEE WITHOUT CARDIOVERSION N/A 03/23/2015   Procedure: TRANSESOPHAGEAL ECHOCARDIOGRAM (TEE);  Surgeon: Jake Bathe, MD;  Location: [facility] ENDOSCOPY;  Service: Cardiovascular;  Laterality: N/A;   TEE WITHOUT CARDIOVERSION N/A 11/02/2019   Procedure: TRANSESOPHAGEAL ECHOCARDIOGRAM (TEE);  Surgeon: Wendall Stade, MD;  Location: [facility] ENDOSCOPY;  Service: Cardiovascular;  Laterality: N/A;   TEE WITHOUT CARDIOVERSION N/A 08/05/2023   Procedure: TRANSESOPHAGEAL ECHOCARDIOGRAM;  Surgeon: Laurey Morale, MD;  Location: [facility] INVASIVE CV LAB;  Service: Cardiovascular;  Laterality: N/A;   THORACIC SPINE SURGERY  03/2000   "ground calcium deposits from upper thoracic" (01/26/2013)   THYROIDECTOMY N/A 05/11/2021   Procedure: TOTAL THYROIDECTOMY;  Surgeon: Darnell Level, MD;  Location: [facility] ORS;  Service: General;  Laterality: N/A;   TOTAL HIP ARTHROPLASTY Right 06/2007   TRANSCATHETER MITRAL EDGE TO EDGE REPAIR N/A 08/21/2023   Procedure: TRANSCATHETER MITRAL EDGE TO EDGE REPAIR;  Surgeon: [name], MD;  Location: [facility] INVASIVE CV LAB;  Service: Cardiovascular;  Laterality: N/A;    TRANSESOPHAGEAL ECHOCARDIOGRAM (CATH LAB) N/A 08/09/2023   Procedure: TRANSESOPHAGEAL ECHOCARDIOGRAM;  Surgeon: Laurey Morale, MD;  Location: [facility] INVASIVE CV LAB;  Service: Cardiovascular;  Laterality: N/A;   TRANSESOPHAGEAL ECHOCARDIOGRAM (CATH LAB) N/A 08/21/2023   Procedure: TRANSESOPHAGEAL ECHOCARDIOGRAM;  Surgeon: [name],  MD;  Location: [facility] INVASIVE CV LAB;  Service: Cardiovascular;  Laterality: N/A;    Current Outpatient Medications  Medication Sig Dispense Refill   acetaminophen (TYLENOL) 500 MG tablet Take 1,000 mg by mouth 2 (two) times daily.     amiodarone (PACERONE) 200 MG tablet Take 1 tablet (200 mg total) by mouth 2 (two) times daily for 21 days, THEN 1 tablet (200 mg total) daily. 120 tablet 1   calcium carbonate (TUMS EX) 750 MG chewable tablet Chew 1 tablet by mouth 2 (two) times daily. 1 with lunch and 1 with supper     cholecalciferol (VITAMIN D3) 25 MCG (1000 UNIT) tablet Take 1,000 Units by mouth in the morning.     Cyanocobalamin (VITAMIN B 12 PO) Take 1,000 mcg by mouth in the morning.     dorzolamide-timolol (COSOPT) 22.3-6.8 MG/ML ophthalmic solution Place 1 drop into the right eye 2 (two) times daily.     empagliflozin (JARDIANCE) 10 MG TABS tablet Take 1 tablet (10 mg total) by mouth daily. 30 tablet 11   escitalopram (LEXAPRO) 20 MG tablet Take 1 tablet (20 mg total) by mouth daily. (Patient taking differently: Take 20 mg by mouth every morning.) 90 tablet 0   Evolocumab (REPATHA SURECLICK) 140 MG/ML SOAJ INJECT 140MG  ( ) INTO SKIN EVERY 2 WEEKS 6 mL 3   Ferrous Sulfate (IRON PO) Take 9 mg by mouth in the morning. Chewable     finasteride (PROSCAR) 5 MG tablet Take 1 tablet (5 mg total) by mouth daily. (Patient taking differently: Take 5 mg by mouth at bedtime.) 30 tablet 2   FOLIC ACID PO Take 1 tablet by mouth daily.     gabapentin (NEURONTIN) 100 MG capsule Take 200 mg by mouth 2 (two) times daily.      latanoprost (XALATAN) 0.005 % ophthalmic solution  Place 1 drop into the right eye daily at 12 noon.     levothyroxine (SYNTHROID) 137 MCG tablet TAKE ONE TABLET DAILY BEFORE BREAKFAST. must make appointment FOR refills 90 tablet 1   Polyethyl Glycol-Propyl Glycol 0.4-0.3 % SOLN Place 1 drop into the left eye in the morning and at bedtime. Systane     potassium chloride SA (KLOR-CON M) 20 MEQ tablet Take 1 tablet (20 mEq total) by mouth daily. 30 tablet 6   RHOPRESSA 0.02 % SOLN Place 1 drop into the right eye at bedtime.     tamsulosin (FLOMAX) 0.4 MG CAPS Take 0.4 mg by mouth at bedtime.      torsemide (DEMADEX) 20 MG tablet Take 3 tablets (60 mg total) by mouth 2 (two) times daily. 180 tablet 6   XARELTO 15 MG TABS tablet TAKE ONE TABLET DAILY WITH SUPPER 90 tablet 1   No current facility-administered medications for this encounter.    Allergies  Allergen Reactions   Pravastatin Sodium Other (See Comments)    Joint and muscle pain   Azithromycin Diarrhea    ROS- All systems are reviewed and negative except as per the HPI above.  Physical Exam: Vitals:   11/12/23 1458  BP: 112/76  Pulse: 85  Weight: 95 kg  Height: 5\' 6"  (1.676 m)    GEN- The patient is well appearing, alert and oriented x 3 today.   Neck - no JVD or carotid bruit noted Lungs- Clear to ausculation bilaterally, normal work of breathing Heart- Regular rate and rhythm, no murmurs, rubs or gallops, PMI not laterally displaced Extremities- no clubbing, cyanosis, or edema Skin - no rash or  ecchymosis noted  Wt Readings from Last 3 Encounters:  11/12/23 95 kg  11/11/23 94.3 kg  10/23/23 92.7 kg    EKG today demonstrates Vent. rate 85 BPM PR interval 126 ms QRS duration 194 ms QT/QTcB 476/566 ms P-R-T axes * 223 35 AV dual-paced rhythm Biventricular pacemaker detected Abnormal ECG When compared with ECG of 28-Oct-2023 09:18, PREVIOUS ECG IS PRESENT  Echo 08/22/23: 1. Left ventricular ejection fraction, by estimation, is 45 to 50%. The  left ventricle  has mildly decreased function. The left ventricle  demonstrates global hypokinesis. There is mild concentric left ventricular  hypertrophy. Left ventricular diastolic  function could not be evaluated.   2. Right ventricular systolic function is normal. The right ventricular  size is mildly enlarged. There is moderately elevated pulmonary artery  systolic pressure. The estimated right ventricular systolic pressure is  52.8 mmHg.   3. Left atrial size was severely dilated.   4. Right atrial size was severely dilated.   5. The mitral valve has been repaired/replaced. There is a Mitra-Clip  present in the mitral position x 2      Mild mitral valve regurgitation. No evidence of mitral stenosis. The  mean mitral valve gradient is 4.0 mmHg.   6. The aortic valve is tricuspid. Aortic valve regurgitation is trivial.  Aortic valve sclerosis is present, with no evidence of aortic valve  stenosis.   7. The inferior vena cava is normal in size with greater than 50%  respiratory variability, suggesting right atrial pressure of 3 mmHg.   Epic records are reviewed at length today  CHA2DS2-VASc Score = 4  The patient's score is based upon: CHF History: 1 HTN History: 1 Diabetes History: 0 Stroke History: 0 Vascular Disease History: 0 Age Score: 2 Gender Score: 0       ASSESSMENT AND PLAN: 1. Persistent Atrial Fibrillation (ICD10:  I48.19) The patient's CHA2DS2-VASc score is 4 , indicating a  4.0% annual risk of stroke.   S/p afib ablation 11/03/19.  Patient is currently in AV paced rhythm. He was in Afib at last office visit and amiodarone was increased to 200 mg BID in preparation for cardioversion. His procedure was canceled due to presenting in AV paced rhythm. Complete amiodarone 200 mg BID re-load as originally prescribed and then transition to 200 mg once daily on 2/13.  High risk medication monitoring (ICD10: R7229428) Patient requires ongoing monitoring for anti-arrhythmic medication  which has the potential to cause life threatening arrhythmias or AV block. Qtc stable. Corrected 478 ms. Continue amiodarone reload and then transition to 200 mg once daily on 2/13  2. Secondary Hypercoagulable State (ICD10:  D68.69) The patient is at significant risk for stroke/thromboembolism based upon his CHA2DS2-VASc Score of 4 .  Continue Rivaroxaban (Xarelto).  Continue without interruption.  3. Chronic systolic CHF Euvolemic today.    Follow up as scheduled with Francis Dowse, PA-C.    [place] Bells, PA-C Afib Clinic [facility] [address] [place], [place] [address] [phone] 11/12/2023 3:18 PM

## 2023-11-13 DIAGNOSIS — I5022 Chronic systolic (congestive) heart failure: Secondary | ICD-10-CM | POA: Diagnosis not present

## 2023-11-13 DIAGNOSIS — R7301 Impaired fasting glucose: Secondary | ICD-10-CM | POA: Diagnosis not present

## 2023-11-13 DIAGNOSIS — D649 Anemia, unspecified: Secondary | ICD-10-CM | POA: Diagnosis not present

## 2023-11-13 DIAGNOSIS — E041 Nontoxic single thyroid nodule: Secondary | ICD-10-CM | POA: Diagnosis not present

## 2023-11-13 DIAGNOSIS — N1832 Chronic kidney disease, stage 3b: Secondary | ICD-10-CM | POA: Diagnosis not present

## 2023-11-13 DIAGNOSIS — Z125 Encounter for screening for malignant neoplasm of prostate: Secondary | ICD-10-CM | POA: Diagnosis not present

## 2023-11-13 DIAGNOSIS — E785 Hyperlipidemia, unspecified: Secondary | ICD-10-CM | POA: Diagnosis not present

## 2023-11-13 DIAGNOSIS — I130 Hypertensive heart and chronic kidney disease with heart failure and stage 1 through stage 4 chronic kidney disease, or unspecified chronic kidney disease: Secondary | ICD-10-CM | POA: Diagnosis not present

## 2023-11-16 LAB — THYROGLOBULIN LEVEL: Thyroglobulin: <2 ng/mL

## 2023-11-20 DIAGNOSIS — Z85850 Personal history of malignant neoplasm of thyroid: Secondary | ICD-10-CM | POA: Diagnosis not present

## 2023-11-20 DIAGNOSIS — Z86006 Personal history of melanoma in-situ: Secondary | ICD-10-CM | POA: Diagnosis not present

## 2023-11-20 DIAGNOSIS — I4821 Permanent atrial fibrillation: Secondary | ICD-10-CM | POA: Diagnosis not present

## 2023-11-20 DIAGNOSIS — I130 Hypertensive heart and chronic kidney disease with heart failure and stage 1 through stage 4 chronic kidney disease, or unspecified chronic kidney disease: Secondary | ICD-10-CM | POA: Diagnosis not present

## 2023-11-20 DIAGNOSIS — E785 Hyperlipidemia, unspecified: Secondary | ICD-10-CM | POA: Diagnosis not present

## 2023-11-20 DIAGNOSIS — I5022 Chronic systolic (congestive) heart failure: Secondary | ICD-10-CM | POA: Diagnosis not present

## 2023-11-20 DIAGNOSIS — Z1331 Encounter for screening for depression: Secondary | ICD-10-CM | POA: Diagnosis not present

## 2023-11-20 DIAGNOSIS — J849 Interstitial pulmonary disease, unspecified: Secondary | ICD-10-CM | POA: Diagnosis not present

## 2023-11-20 DIAGNOSIS — G720 Drug-induced myopathy: Secondary | ICD-10-CM | POA: Diagnosis not present

## 2023-11-20 DIAGNOSIS — J432 Centrilobular emphysema: Secondary | ICD-10-CM | POA: Diagnosis not present

## 2023-11-20 DIAGNOSIS — N184 Chronic kidney disease, stage 4 (severe): Secondary | ICD-10-CM | POA: Diagnosis not present

## 2023-11-20 DIAGNOSIS — Z23 Encounter for immunization: Secondary | ICD-10-CM | POA: Diagnosis not present

## 2023-11-20 DIAGNOSIS — Z1339 Encounter for screening examination for other mental health and behavioral disorders: Secondary | ICD-10-CM | POA: Diagnosis not present

## 2023-11-20 DIAGNOSIS — I10 Essential (primary) hypertension: Secondary | ICD-10-CM | POA: Diagnosis not present

## 2023-11-20 DIAGNOSIS — Z0000 Encounter for general adult medical examination without abnormal findings: Secondary | ICD-10-CM | POA: Diagnosis not present

## 2023-11-25 ENCOUNTER — Ambulatory Visit: Payer: Medicare PPO | Attending: Cardiology

## 2023-11-25 DIAGNOSIS — Z95810 Presence of automatic (implantable) cardiac defibrillator: Secondary | ICD-10-CM

## 2023-11-25 DIAGNOSIS — I5022 Chronic systolic (congestive) heart failure: Secondary | ICD-10-CM

## 2023-11-27 NOTE — Progress Notes (Signed)
 EPIC Encounter for ICM Monitoring  Patient Name: Chad Brown is a 83 y.o. male Date: 11/27/2023 Primary Care Physican: Cleatis Polka., MD Primary Cardiologist: Nishan/McLean Electrophysiologist: Kathreen Cornfield Pacing: 99.9%   07/31/2023 Weight: 209 lbs 08/28/2023 Weight: 206.6 lbs 09/11/2023 Weight: 201 lbs 10/23/2023 Weight: 198 lbs 11/27/2023 Weight: 200 lbs    Clinical Status Since 28-Oct-2023 Time in AT/AF 0.0 hours/day (0.0 %)   Spoke with patient and heart failure questions reviewed.  Transmission results reviewed.  Pt asymptomatic for fluid accumulation.  Reports feeling well at this time and voices no complaints.     Optivol thoracic impedance suggesting possible fluid accumulation from 1/27-2/21.      Prescribed:  Torsemide 20 mg take 3 tablets (60 mg total) by mouth twice a day Potassium 20 mEq take 1 tablet by mouth daily   Labs: 10/07/2023 Creatinine 2.42, BUN 42, Potassium 4.6, Sodium 141, GFR 26 09/26/2023 Creatinine 2.62, BUN 46, Potassium 4.5, Sodium 140, GFR 24 09/06/2023 Creatinine 2.25, BUN 36, Potassium 4.4, Sodium 143, GFR 28 08/22/2023 Creatinine 2.44, BUN 43, Potassium 4.6, Sodium 134, GFR 26   A complete set of results can be found in Results Review.   Recommendations:   No changes and encouraged to call if experiencing any fluid symptoms.     Follow-up plan: ICM clinic phone appointment on 12/30/2023.  91 day device clinic remote transmission 12/26/2023.     EP/Cardiology Next Office Visit:  12/17/2023 with Francis Dowse, PA.    Copy of ICM check sent to Dr. Elberta Fortis.    3 month ICM trend: 11/25/2023.    12-14 Month ICM trend:     Karie Soda, RN 11/27/2023 3:16 PM

## 2023-11-28 ENCOUNTER — Other Ambulatory Visit (HOSPITAL_COMMUNITY)
Admission: RE | Admit: 2023-11-28 | Discharge: 2023-11-28 | Disposition: A | Discharge status: Home / Self Care | Payer: Medicare PPO | Source: Ambulatory Visit | Attending: Nephrology | Admitting: Nephrology

## 2023-11-28 DIAGNOSIS — D631 Anemia in chronic kidney disease: Secondary | ICD-10-CM | POA: Diagnosis not present

## 2023-11-28 DIAGNOSIS — N189 Chronic kidney disease, unspecified: Secondary | ICD-10-CM | POA: Insufficient documentation

## 2023-11-28 DIAGNOSIS — R809 Proteinuria, unspecified: Secondary | ICD-10-CM | POA: Diagnosis not present

## 2023-11-28 LAB — RENAL FUNCTION PANEL
Albumin: 3.6 g/dL (ref 3.5–5.0)
Anion gap: 10 (ref 5–15)
BUN: 43 mg/dL — ABNORMAL HIGH (ref 8–23)
CO2: 29 mmol/L (ref 22–32)
Calcium: 8.7 mg/dL — ABNORMAL LOW (ref 8.9–10.3)
Chloride: 100 mmol/L (ref 98–111)
Creatinine, Ser: 2.77 mg/dL — ABNORMAL HIGH (ref 0.61–1.24)
GFR, Estimated: 22 mL/min — ABNORMAL LOW (ref >60)
Glucose, Bld: 105 mg/dL — ABNORMAL HIGH (ref 70–99)
Phosphorus: 4.4 mg/dL (ref 2.5–4.6)
Potassium: 4.4 mmol/L (ref 3.5–5.1)
Sodium: 139 mmol/L (ref 135–145)

## 2023-11-28 LAB — PROTEIN / CREATININE RATIO, URINE
Creatinine, Urine: 81 mg/dL
Protein Creatinine Ratio: 0.09 mg/mg{creat} (ref 0.00–0.15)
Total Protein, Urine: 7 mg/dL

## 2023-11-28 LAB — CBC
HCT: 37.7 % — ABNORMAL LOW (ref 39.0–52.0)
Hemoglobin: 11.9 g/dL — ABNORMAL LOW (ref 13.0–17.0)
MCH: 31.4 pg (ref 26.0–34.0)
MCHC: 31.6 g/dL (ref 30.0–36.0)
MCV: 99.5 fL (ref 80.0–100.0)
Platelets: 142 10*3/uL — ABNORMAL LOW (ref 150–400)
RBC: 3.79 MIL/uL — ABNORMAL LOW (ref 4.22–5.81)
RDW: 17.9 % — ABNORMAL HIGH (ref 11.5–15.5)
WBC: 5.1 10*3/uL (ref 4.0–10.5)
nRBC: 0 % (ref 0.0–0.2)

## 2023-12-03 DIAGNOSIS — N2581 Secondary hyperparathyroidism of renal origin: Secondary | ICD-10-CM | POA: Diagnosis not present

## 2023-12-03 DIAGNOSIS — D638 Anemia in other chronic diseases classified elsewhere: Secondary | ICD-10-CM | POA: Diagnosis not present

## 2023-12-03 DIAGNOSIS — I5042 Chronic combined systolic (congestive) and diastolic (congestive) heart failure: Secondary | ICD-10-CM | POA: Diagnosis not present

## 2023-12-03 DIAGNOSIS — N184 Chronic kidney disease, stage 4 (severe): Secondary | ICD-10-CM | POA: Diagnosis not present

## 2023-12-09 ENCOUNTER — Other Ambulatory Visit: Payer: Self-pay | Admitting: Cardiology

## 2023-12-11 ENCOUNTER — Ambulatory Visit (HOSPITAL_COMMUNITY)
Admission: RE | Admit: 2023-12-11 | Discharge: 2023-12-11 | Disposition: A | Discharge status: Home / Self Care | Source: Ambulatory Visit | Attending: Internal Medicine | Admitting: Internal Medicine

## 2023-12-11 ENCOUNTER — Telehealth: Payer: Self-pay

## 2023-12-11 VITALS — BP 76/52 | HR 101 | Ht 66.0 in | Wt 207.6 lb

## 2023-12-11 DIAGNOSIS — D6869 Other thrombophilia: Secondary | ICD-10-CM | POA: Insufficient documentation

## 2023-12-11 DIAGNOSIS — I4819 Other persistent atrial fibrillation: Secondary | ICD-10-CM | POA: Insufficient documentation

## 2023-12-11 DIAGNOSIS — G4733 Obstructive sleep apnea (adult) (pediatric): Secondary | ICD-10-CM | POA: Insufficient documentation

## 2023-12-11 DIAGNOSIS — I110 Hypertensive heart disease with heart failure: Secondary | ICD-10-CM | POA: Diagnosis not present

## 2023-12-11 DIAGNOSIS — I4891 Unspecified atrial fibrillation: Secondary | ICD-10-CM | POA: Diagnosis not present

## 2023-12-11 DIAGNOSIS — Z79899 Other long term (current) drug therapy: Secondary | ICD-10-CM | POA: Insufficient documentation

## 2023-12-11 DIAGNOSIS — Z5181 Encounter for therapeutic drug level monitoring: Secondary | ICD-10-CM | POA: Insufficient documentation

## 2023-12-11 DIAGNOSIS — Z7901 Long term (current) use of anticoagulants: Secondary | ICD-10-CM | POA: Diagnosis not present

## 2023-12-11 DIAGNOSIS — Z950 Presence of cardiac pacemaker: Secondary | ICD-10-CM | POA: Insufficient documentation

## 2023-12-11 DIAGNOSIS — I5022 Chronic systolic (congestive) heart failure: Secondary | ICD-10-CM | POA: Insufficient documentation

## 2023-12-11 MED ORDER — AMIODARONE HCL 200 MG PO TABS: ORAL_TABLET | ORAL | Status: DC

## 2023-12-11 NOTE — Telephone Encounter (Signed)
 Returned patient call.  He stated he is unsure if his heart is in rhythm.  He was restless last night and had difficulty sleeping.  Woke up today with dizziness making it difficult to maintain balance.  BP was low this morning and HR between 103-105 HR.  He will send a report today for review.    Received [facility] remote transmission on 3/11 showing: 1 treated AT/AF  Time in AT/AF  <0.1 hours/day (0.2 %).  Observations (1)  Alert: AT/AF>=0.5 h for 1 d.  Waiting for updated [facility] Remote Transmission for 3/12 and will send to device clinic triage for review and follow up.

## 2023-12-11 NOTE — Progress Notes (Signed)
 Primary Care Physician: Cleatis Polka., MD Primary Cardiologist: Eden Emms [facility]: Dr Shirlee Latch Primary Electrophysiologist: Dr Elberta Fortis Referring Physician: Dr Christie Nottingham is a 83 y.o. male with a history of persistent atrial fibrillation, systolic CHF, CKD, OSA, HLD, who presents for follow up in the [facility].  The patient was initially diagnosed with atrial fibrillation 12/2012. Patient is on Xarelto for a CHADS2VASC score of 4. Echo showed EF 30-35%.  He had DCCV back to NSR.  He was back in atrial fibrillation in 1/16, and EF was low again on TEE at that time and he had DCCV.  In 5/16, cardiac MRI showed persistently low EF, so given LBBB, he had [employer] CRT-D device placed.  He was back in atrial fibrillation in 6/16 and had TEE-guided DCCV. Patient noted to be back in afib 10/2019 despite amiodarone therapy. He underwent afib ablation with Dr Johney Frame on 11/03/19. Patient reports that he has done well since the procedure. His weight has been stable and no lower extremity edema. Patient does report that he fell about 3 weeks ago and hit the side of his face on the trash can. He did not seek evaluation for this. No presyncope, loss of consciousness, or residual symptoms. He denies chest pain, swallowing, or groin issues.  On follow up 5/8, he is currently in SR today. Recent review of device on 4/29 showed 30 episodes of AF with 6% burden. Patient notes that when he was in episodes would feel anxious, weak, and dizzy. No shortness of breath or chest pain with episodes.   On follow up 09/06/23, he is currently in AV paced rhythm. Admission for diuresis/volume optimization secondary to acute on chronic HF with reduced EF 11/4-8/24. Admission 11/19-21/24 for severe mitral regurgitation s/p transcatheter edge to edge mitral valve repair. He was noted to be in rapid Afib at admission and started on IV amiodarone. Discharged on amiodarone 200 mg TID then to 200 mg  BID. He has noted no episodes of Afib since hospital discharge on 11/21. He feels better since mitral valve repair, notes to be a little less SOB, and has a little bit more energy. He is currently taking amiodarone 200 mg BID. No missed doses of Xarelto 15 mg daily. He shows me a log of daily weights and for December has been in between 204.4 and 205.6 lbs (today 205 lbs).   On follow up 10/23/23, he is currently in Afib. Patient contacted office today noting he has not felt well the past 3 days; device transmission showed Afib with controlled VR. He notes to feel weak and dizzy at times when out of rhythm. No missed doses of Xarelto.   On follow up 11/12/23, he is currently in AV paced rhythm. He was seen on 1/22 noted to be in Afib and amiodarone was increased to 200 mg BID. He was scheduled for cardioversion on 1/27 and noted to be in AV paced rhythm so procedure was canceled. He is currently taking amiodarone 200 mg BID. He feels better in normal rhythm. No missed doses of Xarelto 15 mg daily.   On follow up 12/11/23, he is currently in V paced rhythm. He contacted office today noting he felt dizzy and likely out of rhythm. Device clinic notes 100% Afib burden since 3/11. He is on amiodarone 200 mg daily and Xarelto 15 mg daily. He is dizzy and feels SOB at times; it is noted when he is out of rhythm he  becomes hypotensive.   Today, he denies symptoms of palpitations, orthopnea, PND, lower extremity edema, presyncope, syncope, snoring, daytime somnolence, bleeding, or neurologic sequela. The patient is tolerating medications without difficulties and is otherwise without complaint today.    Atrial Fibrillation Risk Factors:  he does have symptoms or diagnosis of sleep apnea. he is compliant with CPAP therapy. he does not have a history of rheumatic fever.  he has a BMI of Body mass index is 33.51 kg/m.[name] Filed Weights   12/11/23 1443  Weight: 94.2 kg      Family History  Problem Relation  Age of Onset   Kidney disease Mother    Heart disease Mother        MI, open heart   Diabetes Mother        dialysis   Leukemia Father    Colon cancer Paternal Uncle    Lung cancer Paternal Uncle        x 2   Prostate cancer Paternal Uncle    Diabetes Maternal Grandmother    Heart attack Maternal Uncle    Diabetes Maternal Aunt        x 3   Diabetes Maternal Uncle    Thyroid disease Sister     Atrial Fibrillation Management history:  Previous antiarrhythmic drugs: amiodarone Previous cardioversions: several, most recently 2018 Previous ablations: 11/03/19 CHADS2VASC score: 4 Anticoagulation history: Xarelto   Past Medical History:  Diagnosis Date   Adenomatous colon polyp    tubular   AICD (automatic cardioverter/defibrillator) present 03/08/2015   MDT CRTD dual pacemaker and defib   Anemia    iron deficient   Arthritis    "about all my joints; hands, knees, back" (03/08/2015)   Atherosclerosis    Cataract    left eye small   Cholelithiasis    gallstones   Chronic systolic CHF (congestive heart failure) (HCC)    a. New dx 12/2012 ? NICM, may be r/t afib. b. Nuc 03/2013 - normal;  c. 03/2015 TEE EF 15-20%.   Colon polyp, hyperplastic 01/2015   removed precancerous lesions   Depression    Diverticulosis    Dysrhythmia    afib   GERD (gastroesophageal reflux disease)    Glaucoma    right eye   Hyperlipidemia    Hypertension    Melanoma of eye (HCC) 2000's   "right; it's never been biopsied"   Melanoma of lower leg (HCC) 2015   "left; right at my knee"   Myocardial infarction (HCC) 1998   OSA (obstructive sleep apnea) 01/04/2016   no longer tolerates cpap   Peripheral vision loss, right 2006   Persistent atrial fibrillation (HCC)    a. Dx 12/2012, s/p TEE/DCCV 01/26/13. b. On Xarelto (CHA2DS2VASc = 3);  c. 03/2015 TEE (EF 15-20%, no LAA thrombus) and DCCV - amio increased to 200 mg bid.   Pneumonia    S/P mitral valve clip implantation 08/21/2023   Placement of 1  XTW in the A2/P2 position and 1 NTW in the A1/P1 position by Dr. Lynnette Caffey and Dr. Excell Seltzer   Urinary hesitancy due to benign prostatic hypertrophy    Past Surgical History:  Procedure Laterality Date   ATRIAL FIBRILLATION ABLATION N/A 11/03/2019   Procedure: ATRIAL FIBRILLATION ABLATION;  Surgeon: Hillis Range, MD;  Location: [facility] INVASIVE CV LAB;  Service: Cardiovascular;  Laterality: N/A;   BACK SURGERY     upper back, cannot turn neck well   BIV ICD GENERATOR CHANGEOUT N/A 09/16/2023   Procedure: BIV ICD  GENERATOR CHANGEOUT;  Surgeon: Regan Lemming, MD;  Location: [facility] INVASIVE CV LAB;  Service: Cardiovascular;  Laterality: N/A;   CARDIAC CATHETERIZATION  1998   CARDIOVERSION N/A 01/26/2013   Procedure: CARDIOVERSION;  Surgeon: Lewayne Bunting, MD;  Location: [facility] ENDOSCOPY;  Service: Cardiovascular;  Laterality: N/A;   CARDIOVERSION N/A 03/23/2015   Procedure: CARDIOVERSION;  Surgeon: Jake Bathe, MD;  Location: [facility] ENDOSCOPY;  Service: Cardiovascular;  Laterality: N/A;   CARDIOVERSION N/A 08/14/2017   Procedure: CARDIOVERSION;  Surgeon: Wendall Stade, MD;  Location: [facility] ENDOSCOPY;  Service: Cardiovascular;  Laterality: N/A;   CATARACT EXTRACTION Right ~ 2006   COLONOSCOPY WITH PROPOFOL N/A 02/10/2015   Procedure: COLONOSCOPY WITH PROPOFOL;  Surgeon: Iva Boop, MD;  Location: [facility] ENDOSCOPY;  Service: Endoscopy;  Laterality: N/A;   COLONOSCOPY WITH PROPOFOL N/A 08/07/2016   Procedure: COLONOSCOPY WITH PROPOFOL;  Surgeon: Iva Boop, MD;  Location: [facility] ENDOSCOPY;  Service: Endoscopy;  Laterality: N/A;   ENTEROSCOPY N/A 08/17/2015   Procedure: ENTEROSCOPY;  Surgeon: Iva Boop, MD;  Location: [facility] ENDOSCOPY;  Service: Endoscopy;  Laterality: N/A;   EP IMPLANTABLE DEVICE N/A 03/08/2015   MDT Ovidio Kin CRT-D for nonischemic CM by Dr Johney Frame for primary prevention   GLAUCOMA SURGERY Right ~ 2006   "put 3 stents in to drain fluid" (03/08/2015) not successful, sent to [facility] to try to get last  stent out   HOT HEMOSTASIS N/A 08/07/2016   Procedure: HOT HEMOSTASIS (ARGON PLASMA COAGULATION/BICAP);  Surgeon: Iva Boop, MD;  Location: [facility] ENDOSCOPY;  Service: Endoscopy;  Laterality: N/A;   INCISION AND DRAINAGE ABSCESS POSTERIOR CERVICALSPINE  05/2012   JOINT REPLACEMENT     MELANOMA EXCISION Left 2015   "lower leg; right at my knee"   REFRACTIVE SURGERY Right ~ 2006 X 2   "twice; both done at [facility]" (03/08/2015   RIGHT HEART CATH N/A 08/05/2023   Procedure: RIGHT HEART CATH;  Surgeon: Laurey Morale, MD;  Location: [facility] INVASIVE CV LAB;  Service: Cardiovascular;  Laterality: N/A;   SURGERY SCROTAL / TESTICULAR Right 1990's   TEE WITHOUT CARDIOVERSION N/A 01/26/2013   Procedure: TRANSESOPHAGEAL ECHOCARDIOGRAM (TEE);  Surgeon: Lewayne Bunting, MD;  Location: [facility] ENDOSCOPY;  Service: Cardiovascular;  Laterality: N/A;  [name] anes. /    TEE WITHOUT CARDIOVERSION N/A 10/05/2014   Procedure: TRANSESOPHAGEAL ECHOCARDIOGRAM (TEE)  with cardioversion;  Surgeon: Vesta Mixer, MD;  Location: [facility] ENDOSCOPY;  Service: Cardiovascular;  Laterality: N/A;  12:52 synched cardioversion at 120 joules,...afib to SR...12 lead EKG ordered.Marland KitchenMarland KitchenCardiozem d/c'ed per MD verbal order at SR   TEE WITHOUT CARDIOVERSION N/A 03/23/2015   Procedure: TRANSESOPHAGEAL ECHOCARDIOGRAM (TEE);  Surgeon: Jake Bathe, MD;  Location: [facility] ENDOSCOPY;  Service: Cardiovascular;  Laterality: N/A;   TEE WITHOUT CARDIOVERSION N/A 11/02/2019   Procedure: TRANSESOPHAGEAL ECHOCARDIOGRAM (TEE);  Surgeon: Wendall Stade, MD;  Location: [facility] ENDOSCOPY;  Service: Cardiovascular;  Laterality: N/A;   TEE WITHOUT CARDIOVERSION N/A 08/05/2023   Procedure: TRANSESOPHAGEAL ECHOCARDIOGRAM;  Surgeon: Laurey Morale, MD;  Location: [facility] INVASIVE CV LAB;  Service: Cardiovascular;  Laterality: N/A;   THORACIC SPINE SURGERY  03/2000   "ground calcium deposits from upper thoracic" (01/26/2013)   THYROIDECTOMY N/A 05/11/2021   Procedure: TOTAL THYROIDECTOMY;   Surgeon: Darnell Level, MD;  Location: [facility] ORS;  Service: General;  Laterality: N/A;   TOTAL HIP ARTHROPLASTY Right 06/2007   TRANSCATHETER MITRAL EDGE TO EDGE REPAIR N/A 08/21/2023   Procedure: TRANSCATHETER MITRAL EDGE TO EDGE REPAIR;  Surgeon: [name], MD;  Location: [facility] INVASIVE CV LAB;  Service: Cardiovascular;  Laterality: N/A;   TRANSESOPHAGEAL ECHOCARDIOGRAM (CATH LAB) N/A 08/09/2023   Procedure: TRANSESOPHAGEAL ECHOCARDIOGRAM;  Surgeon: Laurey Morale, MD;  Location: [facility] INVASIVE CV LAB;  Service: Cardiovascular;  Laterality: N/A;   TRANSESOPHAGEAL ECHOCARDIOGRAM (CATH LAB) N/A 08/21/2023   Procedure: TRANSESOPHAGEAL ECHOCARDIOGRAM;  Surgeon: [name], MD;  Location: [facility] INVASIVE CV LAB;  Service: Cardiovascular;  Laterality: N/A;    Current Outpatient Medications  Medication Sig Dispense Refill   acetaminophen (TYLENOL) 500 MG tablet Take 1,000 mg by mouth 2 (two) times daily.     calcium carbonate (TUMS EX) 750 MG chewable tablet Chew 1 tablet by mouth 2 (two) times daily. 1 with lunch and 1 with supper     cholecalciferol (VITAMIN D3) 25 MCG (1000 UNIT) tablet Take 1,000 Units by mouth in the morning.     Cyanocobalamin (VITAMIN B 12 PO) Take 1,000 mcg by mouth in the morning.     dorzolamide-timolol (COSOPT) 22.3-6.8 MG/ML ophthalmic solution Place 1 drop into the right eye 2 (two) times daily.     empagliflozin (JARDIANCE) 10 MG TABS tablet Take 1 tablet (10 mg total) by mouth daily. 30 tablet 11   escitalopram (LEXAPRO) 20 MG tablet Take 1 tablet (20 mg total) by mouth daily. (Patient taking differently: Take 20 mg by mouth every morning.) 90 tablet 0   Evolocumab (REPATHA SURECLICK) 140 MG/ML SOAJ INJECT 140MG ( ) INTO SKIN EVERY 2 WEEKS 6 mL 3   Ferrous Sulfate (IRON PO) Take 9 mg by mouth in the morning. Chewable     finasteride (PROSCAR) 5 MG tablet Take 1 tablet (5 mg total) by mouth daily. (Patient taking differently: Take 5 mg by mouth at bedtime.) 30 tablet 2    FOLIC ACID PO Take 1 tablet by mouth daily.     gabapentin (NEURONTIN) 100 MG capsule Take 200 mg by mouth 2 (two) times daily.      latanoprost (XALATAN) 0.005 % ophthalmic solution Place 1 drop into the right eye daily at 12 noon.     levothyroxine (SYNTHROID) 137 MCG tablet TAKE ONE TABLET DAILY BEFORE BREAKFAST. must make appointment FOR refills 90 tablet 1   Polyethyl Glycol-Propyl Glycol 0.4-0.3 % SOLN Place 1 drop into the left eye in the morning and at bedtime. Systane     potassium chloride SA (KLOR-CON M) 20 MEQ tablet Take 1 tablet (20 mEq total) by mouth daily. 30 tablet 6   RHOPRESSA 0.02 % SOLN Place 1 drop into the right eye at bedtime.     tamsulosin (FLOMAX) 0.4 MG CAPS Take 0.4 mg by mouth at bedtime.      torsemide (DEMADEX) 20 MG tablet Take 3 tablets (60 mg total) by mouth 2 (two) times daily. 180 tablet 6   XARELTO 15 MG TABS tablet TAKE ONE TABLET DAILY WITH SUPPER 90 tablet 1   amiodarone (PACERONE) 200 MG tablet Take 1 tablet (200 mg total) by mouth 2 (two) times daily for 21 days, THEN 1 tablet (200 mg total) daily.     No current facility-administered medications for this encounter.    Allergies  Allergen Reactions   Pravastatin Sodium Other (See Comments)    Joint and muscle pain   Azithromycin Diarrhea    ROS- All systems are reviewed and negative except as per the HPI above.  Physical Exam: Vitals:   12/11/23 1443  BP: (!) 76/52  Pulse: (!) 101  Weight: 94.2  kg  Height: 5\' 6"  (1.676 m)    GEN- The patient is well appearing, alert and oriented x 3 today.   Neck - no JVD or carotid bruit noted Lungs- Clear to ausculation bilaterally, normal work of breathing Heart- Irregular rate and rhythm, no murmurs, rubs or gallops, PMI not laterally displaced Extremities- no clubbing, cyanosis, or edema Skin - no rash or ecchymosis noted   Wt Readings from Last 3 Encounters:  12/11/23 94.2 kg  11/12/23 95 kg  11/11/23 94.3 kg    EKG today  demonstrates Vent. rate 101 BPM PR interval * ms QRS duration 124 ms QT/QTcB 388/503 ms P-R-T axes * 265 -72 Ventricular-paced rhythm Biventricular pacemaker detected Abnormal ECG When compared with ECG of 12-Nov-2023 15:15, PREVIOUS ECG IS PRESENT  Echo 08/22/23: 1. Left ventricular ejection fraction, by estimation, is 45 to 50%. The  left ventricle has mildly decreased function. The left ventricle  demonstrates global hypokinesis. There is mild concentric left ventricular  hypertrophy. Left ventricular diastolic  function could not be evaluated.   2. Right ventricular systolic function is normal. The right ventricular  size is mildly enlarged. There is moderately elevated pulmonary artery  systolic pressure. The estimated right ventricular systolic pressure is  52.8 mmHg.   3. Left atrial size was severely dilated.   4. Right atrial size was severely dilated.   5. The mitral valve has been repaired/replaced. There is a Mitra-Clip  present in the mitral position x 2      Mild mitral valve regurgitation. No evidence of mitral stenosis. The  mean mitral valve gradient is 4.0 mmHg.   6. The aortic valve is tricuspid. Aortic valve regurgitation is trivial.  Aortic valve sclerosis is present, with no evidence of aortic valve  stenosis.   7. The inferior vena cava is normal in size with greater than 50%  respiratory variability, suggesting right atrial pressure of 3 mmHg.   Epic records are reviewed at length today  CHA2DS2-VASc Score = 4  The patient's score is based upon: CHF History: 1 HTN History: 1 Diabetes History: 0 Stroke History: 0 Vascular Disease History: 0 Age Score: 2 Gender Score: 0       ASSESSMENT AND PLAN: 1. Persistent Atrial Fibrillation (ICD10:  I48.19) The patient's CHA2DS2-VASc score is 4 , indicating a  4.0% annual risk of stroke.   S/p afib ablation 11/03/19.  Patient is in V-paced rhythm. He feels poorly when out of rhythm. He just completed an  amiodarone reload of 200 mg BID x 1 month on 2/13. It appears amiodarone is not helping to keep him in normal rhythm. He has mildly decreased LV function noted in serial echocardiograms. His creatinine most recently is 2.77. Due to these factors, it would appear his options for AAD therapy are very limited. His BMI is 33.51, but I am unsure if patient would be a good candidate for repeat ablation (LA severely dilated as well). I do not know if rate control alone would help patient because he becomes dizzy and hypotensive when out of rhythm. He may be a potential candidate for AV nodal ablation, and after discussion with Dr. Elberta Fortis, IF this would be the decision he would need an ICD upgrade with a coronary sinus lead. I will go ahead and reload him on amiodarone 200 mg BID to try and help him get back in normal rhythm for the time being, but he does have a f/u for device check with Francis Dowse, PA-C, on 3/18 (Dr.  [name] is DOD that day). Appreciate further recommendations for treatment with his Afib.   High risk medication monitoring (ICD10: R7229428) Patient requires ongoing monitoring for anti-arrhythmic medication which has the potential to cause life threatening arrhythmias or AV block. Amiodarone increased to 200 mg BID which may be temporary depending on decision for rhythm control since appears that amiodarone is not helping patient.   2. Secondary Hypercoagulable State (ICD10:  D68.69) The patient is at significant risk for stroke/thromboembolism based upon his CHA2DS2-VASc Score of 4 .  Continue Rivaroxaban (Xarelto).  Continue without interruption.  3. Chronic systolic CHF Euvolemic today.   Follow up as scheduled with Francis Dowse, PA-C.    [place] Bells, PA-C Afib Clinic [facility] [address] [place], [place] [address] [phone] 12/11/2023 3:14 PM

## 2023-12-11 NOTE — Telephone Encounter (Signed)
 Received updated 3/12 Carelink Remote Transmission.  AT/AF time has changed from 0.2% to 100%.    Also showing Pace Terminated Episodes AT/AF 2 of 9 (22.2%).    Forwarded to device clinic triage to review Carelink 3/11 & 3/12 report and follow up.

## 2023-12-11 NOTE — Telephone Encounter (Signed)
 Returned patients call.   Patient reports intermittent dizziness this morning and increased fatigue. Patient reports compliance with medications including no missed doses. Has 91 day f/u with Renee 12/17/23. Patient has been seen in device clinic prior and agreeable to be seen if possible sooner than 91 day apt. Routing to AF clinic.

## 2023-12-11 NOTE — Patient Instructions (Signed)
 Increase amiodarone to 200mg  twice a day until follow up with Renee PA next week she will direct any further adjustment to dosing.

## 2023-12-15 NOTE — Progress Notes (Unsigned)
 Cardiology Office Note:  .   Date:  12/15/2023  ID:  [name], DOB [birth year], MRN [id number] PCP: Cleatis Polka., MD  [facility] Providers Cardiologist:  Marca Ancona, MD Sleep Medicine:  Armanda Magic, MD { EP: Dr. Elberta Fortis  History of Present Illness: .   AKAASH VANDEWATER is a 83 y.o. male w/PMHx of  CKD (IV), OSA, HLD LBBB, NICM CRT-D VHD (s/p MTEER Nov 2024)    Recurrent AFib, symptomatic Jan 2025, amio increased and planned for DCCV Arrived to his CV in an AV paced rhythm  Saw AFib clinic team 12/11/22, back in AFib (rate controlled/V paced), dizzy, tends to be hypotensive in Afib Little options for his AADs In d/w Dr. Elberta Fortis, re-titrated his amio up in effort to get him back in SR Planned to see EP APP in the clinic (with Dr. Elberta Fortis in clinic as well) Some consideration for poss AV node ablation and consider addition of a CS lead.  Today's visit is scheduled as his 91 day post gen change visit  ROS:   He comes with his wife today Feels OK today They both report though when he is in Afib feel pretty weak, dizzy, fatigued No CP Some DOE, no rest SOB  When not in AFib Denies any particular symptoms No bleeding or signs of bleeding  Reports good medication compliance including his Xarelto   Device information MDT CRT-D, implanted 03/08/2015, gen change 09/16/23  Arrhythmia/AAD hx Afib found 2014 AFib ablation 11/03/19 (Dr.Allred) Amiodarone started 2021 Recurrent AFib >> during admission with severe MR and > mTEER > amio gtt and re-loaded  Studies Reviewed: [name]    EKG not done today  DEVICE interrogation done today and reviewed by myself Battery and lead measurements are good + atrial ATPs (26), all EGMs reviewed, none appear to have been successful AF burden 13.2% No VT OptiVol looks good  09/13/23: TTE 1. Left ventricular ejection fraction, by estimation, is 50%. The left  ventricle has mildly decreased function. The left ventricle  demonstrates  global hypokinesis. Left ventricular diastolic parameters are  indeterminate.   2. Right ventricular systolic function is mildly reduced. The right  ventricular size is normal. There is moderately elevated pulmonary artery  systolic pressure. The estimated right ventricular systolic pressure is  49.5 mmHg.   3. Left atrial size was moderately dilated.   4. S/p Mitraclip placement. The mitral valve has been repaired/replaced.  Mild mitral valve regurgitation. Mild mitral stenosis. The mean mitral  valve gradient is 4.0 mmHg.   5. The aortic valve is tricuspid. There is mild calcification of the  aortic valve. Aortic valve regurgitation is trivial. No aortic stenosis is  present.   6. Aortic dilatation noted. There is mild dilatation of the ascending  aorta, measuring 40 mm.   7. The inferior vena cava is normal in size with <50% respiratory  variability, suggesting right atrial pressure of 8 mmHg.    11/03/2019 EPS/ablation CONCLUSIONS: 1. Atrial fibrillation upon presentation.   2. Intracardiac echo reveals a moderate sized left atrium with a small R middle PV and a short common ostium to the left PVs, without evidence of pulmonary vein stenosis. 3. Successful electrical isolation and anatomical encircling of all four pulmonary veins with radiofrequency current.  A WACA approach was used 3. Additional left atrial ablation was performed with a standard box lesion created along the posterior wall of the left atrium 4. Atrial fibrillation successfully ablated into sinus rhythm 5. No early  apparent complications.   Risk Assessment/Calculations:    Physical Exam:   VS:  There were no vitals taken for this visit.   Wt Readings from Last 3 Encounters:  12/11/23 207 lb 9.6 oz (94.2 kg)  11/12/23 209 lb 6.4 oz (95 kg)  11/11/23 208 lb (94.3 kg)    GEN: Well nourished, well developed in no acute distress NECK: No JVD; No carotid bruits CARDIAC: RRR, no murmurs, rubs,  gallops RESPIRATORY:  CTA b/l without rales, wheezing or rhonchi  ABDOMEN: Soft, non-tender, non-distended EXTREMITIES:  no edema; No deformity   ICD site: is stable, no thinning, fluctuation, tethering  ASSESSMENT AND PLAN: .    CRT-D intact function no programming changes made  persistent AFib CHA2DS2Vasc is 3, on Xarelto, appropriately dosed 13% burden  Having symptomatic breakthrough AF despite amiodarone BID has pushed him back in SR Discussed with dr. Elberta Fortis if felt to be an ablation candidate He saw the patient today and discussed ongoing amiodarone vs ablation (with hopes to come off amio) Reasonable to pursue another ablation procedure, though nearing perhaps age cutoff. Did not think mTEER or renal function prohibited ablation The patient wanted to pursue ablation  Will continue amio BID as planned/see him back in a mnth, pre-ablation Keep atrial therpaies on for now  VHD S/p mTEER Functioning well on his Nov 2024 echo  C/w Dr. Luan Pulling team   NICM 5.   LBBB No symptoms or exam findings of volume OL OptiVol looks good Improved LVEF by his last echo Nov 2024 C/w dr. Wynonia Musty   6.   Secondary hypercoagulable state 2/2 AFib     Dispo: back in a month or so, sooner if needed  Signed, Sheilah Pigeon, PA-C

## 2023-12-17 ENCOUNTER — Other Ambulatory Visit: Payer: Self-pay

## 2023-12-17 ENCOUNTER — Ambulatory Visit: Payer: Medicare PPO | Attending: Physician Assistant | Admitting: Physician Assistant

## 2023-12-17 ENCOUNTER — Encounter: Payer: Self-pay | Admitting: Physician Assistant

## 2023-12-17 VITALS — BP 136/78 | HR 79 | Resp 16 | Ht 66.0 in | Wt 210.6 lb

## 2023-12-17 DIAGNOSIS — Z9889 Other specified postprocedural states: Secondary | ICD-10-CM | POA: Diagnosis not present

## 2023-12-17 DIAGNOSIS — D6869 Other thrombophilia: Secondary | ICD-10-CM | POA: Diagnosis not present

## 2023-12-17 DIAGNOSIS — I428 Other cardiomyopathies: Secondary | ICD-10-CM

## 2023-12-17 DIAGNOSIS — I447 Left bundle-branch block, unspecified: Secondary | ICD-10-CM | POA: Diagnosis not present

## 2023-12-17 DIAGNOSIS — I4819 Other persistent atrial fibrillation: Secondary | ICD-10-CM

## 2023-12-17 DIAGNOSIS — Z95810 Presence of automatic (implantable) cardiac defibrillator: Secondary | ICD-10-CM | POA: Diagnosis not present

## 2023-12-17 LAB — CUP PACEART INCLINIC DEVICE CHECK
Date Time Interrogation Session: 20250318182753
Implantable Lead Connection Status: 753985
Implantable Lead Connection Status: 753985
Implantable Lead Connection Status: 753985
Implantable Lead Implant Date: 20160607
Implantable Lead Implant Date: 20160607
Implantable Lead Implant Date: 20160607
Implantable Lead Location: 753858
Implantable Lead Location: 753859
Implantable Lead Location: 753860
Implantable Lead Model: 4598
Implantable Lead Model: 5076
Implantable Pulse Generator Implant Date: 20241216

## 2023-12-17 NOTE — Patient Instructions (Addendum)
 Medication Instructions:   Your physician recommends that you continue on your current medications as directed. Please refer to the Current Medication list given to you today.  *If you need a refill on your cardiac medications before your next appointment, please call your pharmacy*   Lab Work: NONE ORDERED  TODAY   If you have labs (blood work) drawn today and your tests are completely normal, you will receive your results only by: MyChart Message (if you have MyChart) OR A paper copy in the mail If you have any lab test that is abnormal or we need to change your treatment, we will call you to review the results.   Testing/Procedures: NONE ORDERED  TODAY     Follow-Up: At [facility], you and your health needs are our priority.  As part of our continuing mission to provide you with exceptional heart care, we have created designated Provider Care Teams.  These Care Teams include your primary Cardiologist (physician) and Advanced Practice Providers (APPs -  Physician Assistants and Nurse Practitioners) who all work together to provide you with the care you need, when you need it.  We recommend signing up for the patient portal called "MyChart".  Sign up information is provided on this After Visit Summary.  MyChart is used to connect with patients for Virtual Visits (Telemedicine).  Patients are able to view lab/test results, encounter notes, upcoming appointments, etc.  Non-urgent messages can be sent to your provider as well.   To learn more about what you can do with MyChart, go to [email or web address].    Your next appointment:    1 month(s) ( CONTACT  CASSIE HALL/ ANGELINE HAMMER FOR EP SCHEDULING ISSUES )   Provider:   Francis Dowse, PA-C    Other Instructions

## 2023-12-25 DIAGNOSIS — H401113 Primary open-angle glaucoma, right eye, severe stage: Secondary | ICD-10-CM | POA: Diagnosis not present

## 2023-12-25 DIAGNOSIS — Z961 Presence of intraocular lens: Secondary | ICD-10-CM | POA: Diagnosis not present

## 2023-12-25 DIAGNOSIS — H04123 Dry eye syndrome of bilateral lacrimal glands: Secondary | ICD-10-CM | POA: Diagnosis not present

## 2023-12-25 DIAGNOSIS — H40012 Open angle with borderline findings, low risk, left eye: Secondary | ICD-10-CM | POA: Diagnosis not present

## 2023-12-25 DIAGNOSIS — H2512 Age-related nuclear cataract, left eye: Secondary | ICD-10-CM | POA: Diagnosis not present

## 2023-12-25 DIAGNOSIS — D3191 Benign neoplasm of unspecified part of right eye: Secondary | ICD-10-CM | POA: Diagnosis not present

## 2023-12-26 ENCOUNTER — Other Ambulatory Visit (HOSPITAL_COMMUNITY): Payer: Self-pay | Admitting: Cardiology

## 2023-12-26 ENCOUNTER — Ambulatory Visit (INDEPENDENT_AMBULATORY_CARE_PROVIDER_SITE_OTHER): Payer: Medicare PPO

## 2023-12-26 DIAGNOSIS — I428 Other cardiomyopathies: Secondary | ICD-10-CM

## 2023-12-26 LAB — CUP PACEART REMOTE DEVICE CHECK
Battery Remaining Longevity: 102 mo
Battery Voltage: 3.05 V
Brady Statistic AP VP Percent: 99.87 %
Brady Statistic AP VS Percent: 0.07 %
Brady Statistic AS VP Percent: 0.05 %
Brady Statistic AS VS Percent: 0.01 %
Brady Statistic RA Percent Paced: 99.94 %
Brady Statistic RV Percent Paced: 99.92 %
Date Time Interrogation Session: 20250327000809
HighPow Impedance: 67 Ohm
Lead Channel Impedance Value: 1140 Ohm
Lead Channel Impedance Value: 1178 Ohm
Lead Channel Impedance Value: 1292 Ohm
Lead Channel Impedance Value: 1501 Ohm
Lead Channel Impedance Value: 361 Ohm
Lead Channel Impedance Value: 380 Ohm
Lead Channel Impedance Value: 418 Ohm
Lead Channel Impedance Value: 437 Ohm
Lead Channel Impedance Value: 551 Ohm
Lead Channel Impedance Value: 779 Ohm
Lead Channel Impedance Value: 798 Ohm
Lead Channel Impedance Value: 893 Ohm
Lead Channel Impedance Value: 931 Ohm
Lead Channel Pacing Threshold Amplitude: 0.625 V
Lead Channel Pacing Threshold Amplitude: 0.75 V
Lead Channel Pacing Threshold Amplitude: 1.25 V
Lead Channel Pacing Threshold Pulse Width: 0.4 ms
Lead Channel Pacing Threshold Pulse Width: 0.4 ms
Lead Channel Pacing Threshold Pulse Width: 0.8 ms
Lead Channel Sensing Intrinsic Amplitude: 14 mV
Lead Channel Sensing Intrinsic Amplitude: 2.1 mV
Lead Channel Setting Pacing Amplitude: 1.5 V
Lead Channel Setting Pacing Amplitude: 1.5 V
Lead Channel Setting Pacing Amplitude: 2.25 V
Lead Channel Setting Pacing Pulse Width: 0.4 ms
Lead Channel Setting Pacing Pulse Width: 0.8 ms
Lead Channel Setting Sensing Sensitivity: 0.3 mV
Zone Setting Status: 755011
Zone Setting Status: 755011

## 2023-12-30 ENCOUNTER — Ambulatory Visit: Attending: Cardiology

## 2023-12-30 DIAGNOSIS — I5022 Chronic systolic (congestive) heart failure: Secondary | ICD-10-CM | POA: Diagnosis not present

## 2023-12-30 DIAGNOSIS — Z95810 Presence of automatic (implantable) cardiac defibrillator: Secondary | ICD-10-CM

## 2023-12-30 NOTE — Progress Notes (Signed)
 EPIC Encounter for ICM Monitoring  Patient Name: Chad Brown is a 83 y.o. male Date: 12/30/2023 Primary Care Physican: Cleatis Polka., MD Primary Cardiologist: Nishan/McLean Electrophysiologist: Kathreen Cornfield Pacing: 96.5%   07/31/2023 Weight: 209 lbs 08/28/2023 Weight: 206.6 lbs 09/11/2023 Weight: 201 lbs 10/23/2023 Weight: 198 lbs 11/27/2023 Weight: 200 lbs    Clinical Status Since 29-Dec-2023 Time in AT/AF  (15.1 %)  Treated AT/AF 1 (3/31 episode in progress at time of report was sent) Since Last Session  29-Dec-2023 to 30-Dec-2023 4 hours Time in AT/AF = 31 minutes   Spoke with patient and heart failure questions reviewed.  Transmission results reviewed.  Pt asymptomatic for fluid accumulation.   Scheduled 5/15 for Atrial Fibrillation Ablation.  Amiodarone increased 3/12.    His BP still running a little low, 91/62.   Optivol thoracic impedance suggesting possible fluid accumulation starting 3/14 and trending back close to baseline.   Pt is followed by Afib Clinic.   Prescribed:  Torsemide 20 mg take 3 tablets (60 mg total) by mouth twice a day Potassium 20 mEq take 1 tablet by mouth daily   Labs: 11/28/2023 Creatinine 2.77, BUN 43, Potassium 4.4, Sodium 139, GFR 22 10/07/2023 Creatinine 2.42, BUN 42, Potassium 4.6, Sodium 141, GFR 26 09/26/2023 Creatinine 2.62, BUN 46, Potassium 4.5, Sodium 140, GFR 24 09/06/2023 Creatinine 2.25, BUN 36, Potassium 4.4, Sodium 143, GFR 28 08/22/2023 Creatinine 2.44, BUN 43, Potassium 4.6, Sodium 134, GFR 26   A complete set of results can be found in Results Review.   Recommendations:   No changes and encouraged to call if experiencing any fluid symptoms.     Follow-up plan: ICM clinic phone appointment on 02/03/2024.  91 day device clinic remote transmission 03/26/2024.     EP/Cardiology Next Office Visit:  4/218/2025 with Francis Dowse, PA.    Copy of ICM check sent to Dr. Elberta Fortis.    3 month ICM trend: 12/30/2023.    12-14  Month ICM trend:     Karie Soda, RN 12/30/2023 4:27 PM

## 2024-01-16 ENCOUNTER — Other Ambulatory Visit: Payer: Self-pay | Admitting: "Endocrinology

## 2024-01-16 DIAGNOSIS — C73 Malignant neoplasm of thyroid gland: Secondary | ICD-10-CM

## 2024-01-16 NOTE — H&P (View-Only) (Signed)
 Cardiology Office Note:  .   Date:  01/16/2024  ID:  Chad Brown, DOB [birth year], MRN [id number] PCP: Jeannine Milroy., MD  [facility] Providers Cardiologist:  Peder Bourdon, MD Sleep Medicine:  Gaylyn Keas, MD { EP: Dr. Lawana Pray  History of Present Illness: .   Chad Brown is a 83 y.o. male w/PMHx of  CKD (IV), OSA, HLD LBBB, NICM CRT-D VHD (s/p MTEER Nov 2024) AFib   Recurrent AFib, symptomatic Jan 2025, amio increased and planned for DCCV Arrived to his CV in an AV paced rhythm  Saw AFib clinic team 12/11/22, back in AFib (rate controlled/V paced), dizzy, tends to be hypotensive in Afib Little options for his AADs In d/w Dr. Lawana Pray, re-titrated his amio up in effort to get him back in SR Planned to see EP APP in the clinic (with Dr. Lawana Pray in clinic as well) Some consideration for poss AV node ablation and consider addition of a CS lead.  I saw him 12/17/23 (as his 91 day post gen change visit) He comes with his wife today Feels OK today They both report though when he is in Afib feel pretty weak, dizzy, fatigued No CP Some DOE, no rest SOB When not in AFib Denies any particular symptoms No bleeding or signs of bleeding Reports good medication compliance including his Xarelto    Given symptomatic AFib w/burden noted 13.2% Discussed with Dr. Lawana Pray if felt to be an ablation candidate He saw the patient today and discussed ongoing amiodarone  vs ablation (with hopes to come off amio) Reasonable to pursue another ablation procedure, though nearing perhaps age cutoff. Did not think mTEER or renal function prohibited ablation The patient wanted to pursue ablation Will continue amio BID as planned/see him back in a month, pre-ablation atrial therpaies left programmed on   Today's visit is a 1 mo pre-ablation visit (scheduled 02/13/24) ROS:   He is accompanied by his wife Reports orthostatic dizziness, lightheaded, some near syncope/stumbles with  it. No syncope No CP They both again confirm when in Afib feels pretty lousy Knees keep him slowed down as well No bleeding/signs of bleeding Reports good medication compliance    Device information MDT CRT-D, implanted 03/08/2015, gen change 09/16/23  Arrhythmia/AAD hx Afib found 2014 AFib ablation 11/03/19 (Dr.Allred) Amiodarone  started 2021  Recurrent AFib >> during admission with severe MR and > mTEER > amio gtt and re-loaded Nov 2024   Studies Reviewed: Aaron Aas    EKG not done today  DEVICE interrogation done today and reviewed by myself Battery and lead measurements are good 11.8% AFib burden Some VS events no EGMs 97.2% BP No VT Both A and V dependent at 40bpm today Thigh did come with R waves at LOC w/thresholds Was symptomatic with VVI testing and did not pursue R waves   09/13/23: TTE 1. Left ventricular ejection fraction, by estimation, is 50%. The left  ventricle has mildly decreased function. The left ventricle demonstrates  global hypokinesis. Left ventricular diastolic parameters are  indeterminate.   2. Right ventricular systolic function is mildly reduced. The right  ventricular size is normal. There is moderately elevated pulmonary artery  systolic pressure. The estimated right ventricular systolic pressure is  49.5 mmHg.   3. Left atrial size was moderately dilated.   4. S/p Mitraclip placement. The mitral valve has been repaired/replaced.  Mild mitral valve regurgitation. Mild mitral stenosis. The mean mitral  valve gradient is 4.0 mmHg.   5. The aortic valve is tricuspid.  There is mild calcification of the  aortic valve. Aortic valve regurgitation is trivial. No aortic stenosis is  present.   6. Aortic dilatation noted. There is mild dilatation of the ascending  aorta, measuring 40 mm.   7. The inferior vena cava is normal in size with <50% respiratory  variability, suggesting right atrial pressure of 8 mmHg.    11/03/2019  EPS/ablation CONCLUSIONS: 1. Atrial fibrillation upon presentation.   2. Intracardiac echo reveals a moderate sized left atrium with a small R middle PV and a short common ostium to the left PVs, without evidence of pulmonary vein stenosis. 3. Successful electrical isolation and anatomical encircling of all four pulmonary veins with radiofrequency current.  A WACA approach was used 3. Additional left atrial ablation was performed with a standard box lesion created along the posterior wall of the left atrium 4. Atrial fibrillation successfully ablated into sinus rhythm 5. No early apparent complications.   Risk Assessment/Calculations:    Physical Exam:   VS:  There were no vitals taken for this visit.   Wt Readings from Last 3 Encounters:  12/17/23 210 lb 9.6 oz (95.5 kg)  12/11/23 207 lb 9.6 oz (94.2 kg)  11/12/23 209 lb 6.4 oz (95 kg)    GEN: Well nourished, well developed in no acute distress NECK: No JVD; No carotid bruits CARDIAC: RRR, no murmurs, rubs, gallops RESPIRATORY: CTA b/l without rales, wheezing or rhonchi  ABDOMEN: Soft, non-tender, non-distended EXTREMITIES: no edema; No deformity   ICD site: is stable, no thinning, fluctuation, tethering  ASSESSMENT AND PLAN: .    CRT-D intact function no programming changes made  persistent AFib CHA2DS2Vasc is 3, on Xarelto , appropriately dosed Reduce his amiodarone  to 200mg  daily  Discussed day of his procedure Post procedure appointments Reviewed with EP scheduler, she reports Dr. Lawana Pray did not need a CT per-ablation for this case   VHD S/p mTEER Functioning well on his Nov 2024 echo  C/w Dr. Duard Getting team   NICM 5.   LBBB No symptoms or exam findings of volume OL OptiVol looks good Improved LVEF by his last echo Nov 2024 Very orthostatic, BP low end today Reduce his torsemide  to 40mg  BID (from 60mg  BID) Advised to hydrate better as well keeping a balance of well hydrated without volume OL can  be challenging, but suspect he is dehydrated C/w Dr. Adam Holm   6.   Secondary hypercoagulable state 2/2 AFib     Dispo:  Signed, Debbie Fails, PA-C

## 2024-01-16 NOTE — Progress Notes (Addendum)
 Cardiology Office Note:  .   Date:  01/16/2024  ID:  Chad Brown, DOB [birth year], MRN [id number] PCP: Jeannine Milroy., MD  [facility] Providers Cardiologist:  Peder Bourdon, MD Sleep Medicine:  Gaylyn Keas, MD { EP: Dr. Lawana Pray  History of Present Illness: .   Chad Brown is a 83 y.o. male w/PMHx of  CKD (IV), OSA, HLD LBBB, NICM CRT-D VHD (s/p MTEER Nov 2024) AFib   Recurrent AFib, symptomatic Jan 2025, amio increased and planned for DCCV Arrived to his CV in an AV paced rhythm  Saw AFib clinic team 12/11/22, back in AFib (rate controlled/V paced), dizzy, tends to be hypotensive in Afib Little options for his AADs In d/w Dr. Lawana Pray, re-titrated his amio up in effort to get him back in SR Planned to see EP APP in the clinic (with Dr. Lawana Pray in clinic as well) Some consideration for poss AV node ablation and consider addition of a CS lead.  I saw him 12/17/23 (as his 91 day post gen change visit) He comes with his wife today Feels OK today They both report though when he is in Afib feel pretty weak, dizzy, fatigued No CP Some DOE, no rest SOB When not in AFib Denies any particular symptoms No bleeding or signs of bleeding Reports good medication compliance including his Xarelto    Given symptomatic AFib w/burden noted 13.2% Discussed with Dr. Lawana Pray if felt to be an ablation candidate He saw the patient today and discussed ongoing amiodarone  vs ablation (with hopes to come off amio) Reasonable to pursue another ablation procedure, though nearing perhaps age cutoff. Did not think mTEER or renal function prohibited ablation The patient wanted to pursue ablation Will continue amio BID as planned/see him back in a month, pre-ablation atrial therpaies left programmed on   Today's visit is a 1 mo pre-ablation visit (scheduled 02/13/24) ROS:   He is accompanied by his wife Reports orthostatic dizziness, lightheaded, some near syncope/stumbles with  it. No syncope No CP They both again confirm when in Afib feels pretty lousy Knees keep him slowed down as well No bleeding/signs of bleeding Reports good medication compliance    Device information MDT CRT-D, implanted 03/08/2015, gen change 09/16/23  Arrhythmia/AAD hx Afib found 2014 AFib ablation 11/03/19 (Dr.Allred) Amiodarone  started 2021  Recurrent AFib >> during admission with severe MR and > mTEER > amio gtt and re-loaded Nov 2024   Studies Reviewed: Aaron Aas    EKG not done today  DEVICE interrogation done today and reviewed by myself Battery and lead measurements are good 11.8% AFib burden Some VS events no EGMs 97.2% BP No VT Both A and V dependent at 40bpm today Thigh did come with R waves at LOC w/thresholds Was symptomatic with VVI testing and did not pursue R waves   09/13/23: TTE 1. Left ventricular ejection fraction, by estimation, is 50%. The left  ventricle has mildly decreased function. The left ventricle demonstrates  global hypokinesis. Left ventricular diastolic parameters are  indeterminate.   2. Right ventricular systolic function is mildly reduced. The right  ventricular size is normal. There is moderately elevated pulmonary artery  systolic pressure. The estimated right ventricular systolic pressure is  49.5 mmHg.   3. Left atrial size was moderately dilated.   4. S/p Mitraclip placement. The mitral valve has been repaired/replaced.  Mild mitral valve regurgitation. Mild mitral stenosis. The mean mitral  valve gradient is 4.0 mmHg.   5. The aortic valve is tricuspid.  There is mild calcification of the  aortic valve. Aortic valve regurgitation is trivial. No aortic stenosis is  present.   6. Aortic dilatation noted. There is mild dilatation of the ascending  aorta, measuring 40 mm.   7. The inferior vena cava is normal in size with <50% respiratory  variability, suggesting right atrial pressure of 8 mmHg.    11/03/2019  EPS/ablation CONCLUSIONS: 1. Atrial fibrillation upon presentation.   2. Intracardiac echo reveals a moderate sized left atrium with a small R middle PV and a short common ostium to the left PVs, without evidence of pulmonary vein stenosis. 3. Successful electrical isolation and anatomical encircling of all four pulmonary veins with radiofrequency current.  A WACA approach was used 3. Additional left atrial ablation was performed with a standard box lesion created along the posterior wall of the left atrium 4. Atrial fibrillation successfully ablated into sinus rhythm 5. No early apparent complications.   Risk Assessment/Calculations:    Physical Exam:   VS:  There were no vitals taken for this visit.   Wt Readings from Last 3 Encounters:  12/17/23 210 lb 9.6 oz (95.5 kg)  12/11/23 207 lb 9.6 oz (94.2 kg)  11/12/23 209 lb 6.4 oz (95 kg)    GEN: Well nourished, well developed in no acute distress NECK: No JVD; No carotid bruits CARDIAC: RRR, no murmurs, rubs, gallops RESPIRATORY: CTA b/l without rales, wheezing or rhonchi  ABDOMEN: Soft, non-tender, non-distended EXTREMITIES: no edema; No deformity   ICD site: is stable, no thinning, fluctuation, tethering  ASSESSMENT AND PLAN: .    CRT-D intact function no programming changes made  persistent AFib CHA2DS2Vasc is 3, on Xarelto , appropriately dosed Reduce his amiodarone  to 200mg  daily  Discussed day of his procedure Post procedure appointments Reviewed with EP scheduler, she reports Dr. Lawana Pray did not need a CT per-ablation for this case   VHD S/p mTEER Functioning well on his Nov 2024 echo  C/w Dr. Duard Getting team   NICM 5.   LBBB No symptoms or exam findings of volume OL OptiVol looks good Improved LVEF by his last echo Nov 2024 Very orthostatic, BP low end today Reduce his torsemide  to 40mg  BID (from 60mg  BID) Advised to hydrate better as well keeping a balance of well hydrated without volume OL can  be challenging, but suspect he is dehydrated C/w Dr. Adam Holm   6.   Secondary hypercoagulable state 2/2 AFib     Dispo:  Signed, Debbie Fails, PA-C

## 2024-01-20 ENCOUNTER — Ambulatory Visit: Attending: Physician Assistant | Admitting: Physician Assistant

## 2024-01-20 VITALS — BP 98/58 | HR 72 | Ht 66.0 in | Wt 206.0 lb

## 2024-01-20 DIAGNOSIS — I4819 Other persistent atrial fibrillation: Secondary | ICD-10-CM

## 2024-01-20 DIAGNOSIS — Z95810 Presence of automatic (implantable) cardiac defibrillator: Secondary | ICD-10-CM

## 2024-01-20 DIAGNOSIS — I5022 Chronic systolic (congestive) heart failure: Secondary | ICD-10-CM

## 2024-01-20 DIAGNOSIS — I447 Left bundle-branch block, unspecified: Secondary | ICD-10-CM | POA: Diagnosis not present

## 2024-01-20 DIAGNOSIS — Z01818 Encounter for other preprocedural examination: Secondary | ICD-10-CM | POA: Diagnosis not present

## 2024-01-20 DIAGNOSIS — I428 Other cardiomyopathies: Secondary | ICD-10-CM | POA: Diagnosis not present

## 2024-01-20 LAB — CUP PACEART INCLINIC DEVICE CHECK
Battery Remaining Longevity: 101 mo
Battery Voltage: 3.03 V
Brady Statistic AP VP Percent: 99.85 %
Brady Statistic AP VS Percent: 0.07 %
Brady Statistic AS VP Percent: 0.07 %
Brady Statistic AS VS Percent: 0.01 %
Brady Statistic RA Percent Paced: 89.19 %
Brady Statistic RV Percent Paced: 97.38 %
Date Time Interrogation Session: 20250421095750
HighPow Impedance: 73 Ohm
Implantable Lead Connection Status: 753985
Implantable Lead Connection Status: 753985
Implantable Lead Connection Status: 753985
Implantable Lead Implant Date: 20160607
Implantable Lead Implant Date: 20160607
Implantable Lead Implant Date: 20160607
Implantable Lead Location: 753858
Implantable Lead Location: 753859
Implantable Lead Location: 753860
Implantable Lead Model: 4598
Implantable Lead Model: 5076
Implantable Pulse Generator Implant Date: 20241216
Lead Channel Impedance Value: 1007 Ohm
Lead Channel Impedance Value: 1026 Ohm
Lead Channel Impedance Value: 1159 Ohm
Lead Channel Impedance Value: 1178 Ohm
Lead Channel Impedance Value: 1330 Ohm
Lead Channel Impedance Value: 361 Ohm
Lead Channel Impedance Value: 456 Ohm
Lead Channel Impedance Value: 475 Ohm
Lead Channel Impedance Value: 532 Ohm
Lead Channel Impedance Value: 608 Ohm
Lead Channel Impedance Value: 684 Ohm
Lead Channel Impedance Value: 741 Ohm
Lead Channel Impedance Value: 912 Ohm
Lead Channel Pacing Threshold Amplitude: 0.75 V
Lead Channel Pacing Threshold Amplitude: 0.75 V
Lead Channel Pacing Threshold Amplitude: 0.75 V
Lead Channel Pacing Threshold Amplitude: 0.75 V
Lead Channel Pacing Threshold Amplitude: 1 V
Lead Channel Pacing Threshold Amplitude: 1.25 V
Lead Channel Pacing Threshold Pulse Width: 0.4 ms
Lead Channel Pacing Threshold Pulse Width: 0.4 ms
Lead Channel Pacing Threshold Pulse Width: 0.4 ms
Lead Channel Pacing Threshold Pulse Width: 0.4 ms
Lead Channel Pacing Threshold Pulse Width: 0.8 ms
Lead Channel Pacing Threshold Pulse Width: 0.8 ms
Lead Channel Sensing Intrinsic Amplitude: 0.3 mV
Lead Channel Sensing Intrinsic Amplitude: 13.8 mV
Lead Channel Setting Pacing Amplitude: 1.5 V
Lead Channel Setting Pacing Amplitude: 1.5 V
Lead Channel Setting Pacing Amplitude: 2.25 V
Lead Channel Setting Pacing Pulse Width: 0.4 ms
Lead Channel Setting Pacing Pulse Width: 0.8 ms
Lead Channel Setting Sensing Sensitivity: 0.3 mV
Zone Setting Status: 755011
Zone Setting Status: 755011

## 2024-01-20 LAB — CBC

## 2024-01-20 MED ORDER — TORSEMIDE 20 MG PO TABS: 40.0000 mg | ORAL_TABLET | Freq: Two times a day (BID) | ORAL | Status: DC

## 2024-01-20 MED ORDER — AMIODARONE HCL 200 MG PO TABS: 200.0000 mg | ORAL_TABLET | Freq: Every day | ORAL | Status: DC

## 2024-01-20 NOTE — Patient Instructions (Signed)
 Medication Instructions:   START TAKING : AMIODARONE  200 MG ONCE A DAY   START TAKING : TORSEMIDE  40 MG TWICE  A DAY    *If you need a refill on your cardiac medications before your next appointment, please call your pharmacy*   Lab Work:  PLEASE GO DOWN STAIRS  [facility]  FIRST FLOOR  [address] ( GET OFF ELEVATORS MAKE A LEFT AND ANOTHER LEFT LAB ON RIGHT DOWN HALLWAY :  CMET CBC AND TSH TODAY     If you have labs (blood work) drawn today and your tests are completely normal, you will receive your results only by: MyChart Message (if you have MyChart) OR A paper copy in the mail If you have any lab test that is abnormal or we need to change your treatment, we will call you to review the results.  Testing/Procedures:  SEE LETTER FOR UP AND COMING PROCEDURE    Follow-Up: At [facility], you and your health needs are our priority.  As part of our continuing mission to provide you with exceptional heart care, our providers are all part of one team.  This team includes your primary Cardiologist (physician) and Advanced Practice Providers or APPs (Physician Assistants and Nurse Practitioners) who all work together to provide you with the care you need, when you need it.  Your next appointment:  YOU WILL BE CONTACTED BY APPOINTMENT SCHEDULAR      1st Floor: - Lobby - Registration  - Pharmacy  - Lab - Cafe  2nd Floor: - PV Lab - Diagnostic Testing (echo, CT, nuclear med)  3rd Floor: - Vacant  4th Floor: - TCTS (cardiothoracic surgery) - AFib Clinic - Structural Heart Clinic - Vascular Surgery  - Vascular Ultrasound  5th Floor: - HeartCare Cardiology (general and EP) - Clinical Pharmacy for coumadin, hypertension, lipid, weight-loss medications, and med management appointments    Valet parking services will be available as well.

## 2024-01-21 LAB — COMPREHENSIVE METABOLIC PANEL WITH GFR
ALT: 21 IU/L (ref 0–44)
AST: 13 IU/L (ref 0–40)
Albumin: 4.2 g/dL (ref 3.7–4.7)
Alkaline Phosphatase: 97 IU/L (ref 44–121)
BUN/Creatinine Ratio: 17 (ref 10–24)
BUN: 50 mg/dL — ABNORMAL HIGH (ref 8–27)
Bilirubin Total: 0.3 mg/dL (ref 0.0–1.2)
CO2: 26 mmol/L (ref 20–29)
Calcium: 8.8 mg/dL (ref 8.6–10.2)
Chloride: 105 mmol/L (ref 96–106)
Creatinine, Ser: 2.94 mg/dL — ABNORMAL HIGH (ref 0.76–1.27)
Globulin, Total: 2.7 g/dL (ref 1.5–4.5)
Glucose: 100 mg/dL — ABNORMAL HIGH (ref 70–99)
Potassium: 5 mmol/L (ref 3.5–5.2)
Sodium: 146 mmol/L — ABNORMAL HIGH (ref 134–144)
Total Protein: 6.9 g/dL (ref 6.0–8.5)
eGFR: 20 mL/min/{1.73_m2} — ABNORMAL LOW (ref >59)

## 2024-01-21 LAB — CBC
Hematocrit: 37.7 % (ref 37.5–51.0)
Hemoglobin: 12.3 g/dL — ABNORMAL LOW (ref 13.0–17.7)
MCH: 32.8 pg (ref 26.6–33.0)
MCHC: 32.6 g/dL (ref 31.5–35.7)
MCV: 101 fL — ABNORMAL HIGH (ref 79–97)
Platelets: 135 10*3/uL — ABNORMAL LOW (ref 150–450)
RBC: 3.75 x10E6/uL — ABNORMAL LOW (ref 4.14–5.80)
RDW: 14.3 % (ref 11.6–15.4)
WBC: 5.7 10*3/uL (ref 3.4–10.8)

## 2024-01-21 LAB — THYROID PANEL WITH TSH
Free Thyroxine Index: 3.1 (ref 1.2–4.9)
T3 Uptake Ratio: 36 % (ref 24–39)
T4, Total: 8.5 ug/dL (ref 4.5–12.0)
TSH: 0.88 u[IU]/mL (ref 0.450–4.500)

## 2024-01-23 ENCOUNTER — Other Ambulatory Visit: Payer: Self-pay | Admitting: *Deleted

## 2024-01-23 DIAGNOSIS — Z79899 Other long term (current) drug therapy: Secondary | ICD-10-CM

## 2024-01-28 ENCOUNTER — Telehealth (HOSPITAL_COMMUNITY): Payer: Self-pay

## 2024-01-28 NOTE — Telephone Encounter (Signed)
 Attempted to reach patient to discuss upcoming procedure, no answer. Left VM for patient to return call.

## 2024-01-29 NOTE — Telephone Encounter (Signed)
 Spoke with patient to complete pre-procedure call.     Pre-procedure testing scheduled: lab work completed, CT not required.  New medical conditions? No Recent hospitalizations or surgeries? No Any recent signs of acute illness or been started on antibiotics? No Any changes in activities of daily living? No Started any new medications? No Any medications to hold? Hold Jardiance  3 days  Any missed doses of blood thinner? No  Confirmed patient is taking Xarelto  daily and will continue taking medication before procedure or it may need to be rescheduled.  On the morning of your procedure DO NOT take any medication., including Xarelto  or the procedure may be rescheduled. Nothing to eat or drink after midnight prior to your procedure.  Confirmed patient is scheduled for Atrial Flutter Ablation on Thursday, May 15 with Dr. Agatha Horsfall. Instructed patient to arrive at the Main Entrance A at [facility]: [address] [place], [place] [address] and check in at Admitting at 5:30 AM.  Advised of plan to go home the same day and will only stay overnight if medically necessary. You MUST have a responsible adult to drive you home and MUST be with you the first 24 hours after you arrive home or your procedure could be cancelled.  Patient verbalized understanding to information provided and is agreeable to proceed with procedure.

## 2024-01-31 ENCOUNTER — Other Ambulatory Visit (HOSPITAL_COMMUNITY)
Admission: RE | Admit: 2024-01-31 | Discharge: 2024-01-31 | Disposition: A | Discharge status: Home / Self Care | Source: Ambulatory Visit | Attending: Physician Assistant | Admitting: Physician Assistant

## 2024-01-31 ENCOUNTER — Other Ambulatory Visit (HOSPITAL_COMMUNITY)
Admission: RE | Admit: 2024-01-31 | Discharge: 2024-01-31 | Disposition: A | Discharge status: Home / Self Care | Source: Ambulatory Visit | Attending: "Endocrinology | Admitting: "Endocrinology

## 2024-01-31 DIAGNOSIS — Z79899 Other long term (current) drug therapy: Secondary | ICD-10-CM | POA: Insufficient documentation

## 2024-01-31 DIAGNOSIS — C73 Malignant neoplasm of thyroid gland: Secondary | ICD-10-CM | POA: Insufficient documentation

## 2024-01-31 DIAGNOSIS — Z01812 Encounter for preprocedural laboratory examination: Secondary | ICD-10-CM | POA: Insufficient documentation

## 2024-01-31 DIAGNOSIS — E890 Postprocedural hypothyroidism: Secondary | ICD-10-CM | POA: Diagnosis not present

## 2024-01-31 LAB — BASIC METABOLIC PANEL WITH GFR
Anion gap: 10 (ref 5–15)
BUN: 40 mg/dL — ABNORMAL HIGH (ref 8–23)
CO2: 26 mmol/L (ref 22–32)
Calcium: 8.8 mg/dL — ABNORMAL LOW (ref 8.9–10.3)
Chloride: 103 mmol/L (ref 98–111)
Creatinine, Ser: 2.74 mg/dL — ABNORMAL HIGH (ref 0.61–1.24)
GFR, Estimated: 22 mL/min — ABNORMAL LOW (ref >60)
Glucose, Bld: 111 mg/dL — ABNORMAL HIGH (ref 70–99)
Potassium: 4.5 mmol/L (ref 3.5–5.1)
Sodium: 139 mmol/L (ref 135–145)

## 2024-01-31 LAB — T4, FREE: Free T4: 1.1 ng/dL (ref 0.61–1.12)

## 2024-01-31 LAB — TSH: TSH: 0.799 u[IU]/mL (ref 0.350–4.500)

## 2024-02-01 LAB — THYROGLOBULIN ANTIBODY: Thyroglobulin Antibody: <1 [IU]/mL (ref 0.0–0.9)

## 2024-02-03 ENCOUNTER — Ambulatory Visit: Attending: Cardiology

## 2024-02-03 DIAGNOSIS — I5022 Chronic systolic (congestive) heart failure: Secondary | ICD-10-CM | POA: Diagnosis not present

## 2024-02-03 DIAGNOSIS — Z95810 Presence of automatic (implantable) cardiac defibrillator: Secondary | ICD-10-CM | POA: Diagnosis not present

## 2024-02-03 NOTE — Progress Notes (Signed)
 EPIC Encounter for ICM Monitoring  Patient Name: Chad Brown is a 83 y.o. male Date: 02/03/2024 Primary Care Physican: Jeannine Milroy., MD Primary Cardiologist: Nishan/McLean Electrophysiologist: Morrell Aran Pacing: 95.8%   10/23/2023 Weight: 198 lbs 11/27/2023 Weight: 200 lbs 02/03/2024 Weight: 203 lbs    Clinical Status Since 21-Jan-2024 Time in AT/AF 5.7 hours/day (23.9 %)  Treated AT/AF 4   Spoke with patient and heart failure questions reviewed.  Transmission results reviewed.  Pt asymptomatic for fluid accumulation.   He is feeling well today.   Scheduled 5/15 for Atrial Fibrillation Ablation.     Optivol thoracic impedance suggesting possible fluid accumulation starting 4/22 (Torsemide  decreased from 60 mg to 40 daily on 4/21 OV).   Pt is followed by Afib Clinic.   Prescribed:  Torsemide  20 mg take 2 tablets (40 mg total) by mouth twice a day Potassium 20 mEq take 1 tablet by mouth daily   Labs: 01/31/2024 Creatinine 2.74, BUN 40, Potassium 4.5, Sodium 139 01/20/2024 Creatinine 2.94, BUN 50, Potassium 5.0, Sodium 146, GFR 20 11/28/2023 Creatinine 2.77, BUN 43, Potassium 4.4, Sodium 139, GFR 22 10/07/2023 Creatinine 2.42, BUN 42, Potassium 4.6, Sodium 141, GFR 26 09/26/2023 Creatinine 2.62, BUN 46, Potassium 4.5, Sodium 140, GFR 24 09/06/2023 Creatinine 2.25, BUN 36, Potassium 4.4, Sodium 143, GFR 28 08/22/2023 Creatinine 2.44, BUN 43, Potassium 4.6, Sodium 134, GFR 26   A complete set of results can be found in Results Review.   Recommendations:   Encouraged to limit fluid intake to 64 oz daily.   Follow-up plan: ICM clinic phone appointment on 02/10/2024 to recheck fluid levels.  91 day device clinic remote transmission 03/26/2024.     EP/Cardiology Next Office Visit:  None scheduled.  Recall 01/24/2024 with Dr Mitzie Anda.   Copy of ICM check sent to Dr. Lawana Pray.     3 month ICM trend: 02/03/2024.    12-14 Month ICM trend:     Almyra Jain, RN 02/03/2024 3:58  PM

## 2024-02-05 NOTE — Progress Notes (Signed)
 Remote ICD transmission.

## 2024-02-06 ENCOUNTER — Telehealth (HOSPITAL_COMMUNITY): Payer: Self-pay

## 2024-02-06 NOTE — Telephone Encounter (Signed)
 Spoke with patient to discuss upcoming procedure.   CT: not required  Labs: completed.   Any recent signs of acute illness or been started on antibiotics? No Any new medications started? No Any medications to hold? Hold Jardiance  3 days prior- last dose 5/11. Any missed doses of blood thinner? No Advised patient to continue taking ANTICOAGULANT: Xarelto  (Rivaroxaban ) daily without missing any doses.  Medication instructions:  On the morning of your procedure DO NOT take any medication., including Xarelto  or the procedure may be rescheduled. Nothing to eat or drink after midnight prior to your procedure.  Confirmed patient is scheduled for Atrial Fibrillation Ablation on Thursday, May 15 with Dr. Agatha Horsfall. Instructed patient to arrive at the Main Entrance A at [facility]: [address] [place], [place] [address] and check in at Admitting at 5:30 AM.   Advised of plan to go home the same day and will only stay overnight if medically necessary. You MUST have a responsible adult to drive you home and MUST be with you the first 24 hours after you arrive home or your procedure could be cancelled.  Patient verbalized understanding to all instructions provided and agreed to proceed with procedure.

## 2024-02-06 NOTE — Telephone Encounter (Signed)
 Attempted to reach patient to discuss upcoming procedure, no answer. Left VM for patient to return call.

## 2024-02-10 ENCOUNTER — Ambulatory Visit: Attending: Cardiology

## 2024-02-10 DIAGNOSIS — I5022 Chronic systolic (congestive) heart failure: Secondary | ICD-10-CM

## 2024-02-10 DIAGNOSIS — Z95810 Presence of automatic (implantable) cardiac defibrillator: Secondary | ICD-10-CM

## 2024-02-10 NOTE — Progress Notes (Signed)
 EPIC Encounter for ICM Monitoring  Patient Name: Chad Brown is a 83 y.o. male Date: 02/10/2024 Primary Care Physican: Jeannine Milroy., MD Primary Cardiologist: Nishan/McLean Electrophysiologist: Morrell Aran Pacing: 90.4%   10/23/2023 Weight: 198 lbs 11/27/2023 Weight: 200 lbs 02/03/2024 Weight: 203 lbs 01/30/2024 Weight: 206 lbs    Clinical Status Since 02-Feb-2024 Time in AT/AF 9.7 hours/day (40.5 %) Treated AT/AF 4   Spoke with patient and heart failure questions reviewed.  Transmission results reviewed.  Pt asymptomatic for fluid accumulation.   He is feeling well today.  He ate at restaurant last night and weight is up 2 lbs.   Scheduled 5/15 for Atrial Fibrillation Ablation.     Optivol thoracic impedance suggesting possible fluid accumulation starting 4/22 and trends slightly below baseline. (Torsemide  decreased from 60 mg to 40 daily on 4/21 OV).   Pt is followed by [facility] Clinic.   Prescribed:  Torsemide  20 mg take 2 tablets (40 mg total) by mouth twice a day Potassium 20 mEq take 1 tablet by mouth daily   Labs: 01/31/2024 Creatinine 2.74, BUN 40, Potassium 4.5, Sodium 139 01/20/2024 Creatinine 2.94, BUN 50, Potassium 5.0, Sodium 146, GFR 20 11/28/2023 Creatinine 2.77, BUN 43, Potassium 4.4, Sodium 139, GFR 22 10/07/2023 Creatinine 2.42, BUN 42, Potassium 4.6, Sodium 141, GFR 26 09/26/2023 Creatinine 2.62, BUN 46, Potassium 4.5, Sodium 140, GFR 24 09/06/2023 Creatinine 2.25, BUN 36, Potassium 4.4, Sodium 143, GFR 28 08/22/2023 Creatinine 2.44, BUN 43, Potassium 4.6, Sodium 134, GFR 26   A complete set of results can be found in Results Review.   Recommendations:   Advised to limit salt intake and call if experiencing any fluid symptoms.   Follow-up plan: ICM clinic phone appointment on 03/09/2024.  91 day device clinic remote transmission 03/26/2024.     EP/Cardiology Next Office Visit:  None scheduled.  Recall 01/24/2024 with Dr Mitzie Anda.   Copy of ICM check sent to Dr.  Lawana Pray.     3 month ICM trend: 02/10/2024.     12-14 Month ICM trend:     Almyra Jain, RN 02/10/2024 2:48 PM

## 2024-02-12 LAB — THYROGLOBULIN LEVEL: Thyroglobulin: <2 ng/mL

## 2024-02-12 NOTE — Pre-Procedure Instructions (Addendum)
 Spoke with wife Haskell Linker regarding procedure instructions.  Instructed on the following items: Arrival time 0515 Nothing to eat or drink after midnight No meds AM of procedure Responsible person to drive you home and stay with you for 24 hrs  Have you missed any doses of anti-coagulant Xarelto - takes once a day, hasn't missed any doses. Don't take dose morning of procedure.

## 2024-02-12 NOTE — Anesthesia Preprocedure Evaluation (Signed)
 Anesthesia Evaluation  Patient identified by MRN, date of birth, ID band Patient awake    Reviewed: Allergy & Precautions, NPO status , Patient's Chart, lab work & pertinent test results  Airway Mallampati: II  TM Distance: >3 FB Neck ROM: Full    Dental  (+) Dental Advisory Given, Lower Dentures, Upper Dentures   Pulmonary sleep apnea , pneumonia, former smoker   Pulmonary exam normal breath sounds clear to auscultation       Cardiovascular hypertension, Pt. on medications + CAD, + Past MI, + Peripheral Vascular Disease and +CHF  + dysrhythmias Atrial Fibrillation + Cardiac Defibrillator + Valvular Problems/Murmurs (S/P mitral valve clip implantation)  Rhythm:Irregular Rate:Abnormal  ECHO 12/25 1. Left ventricular ejection fraction, by estimation, is 50%. The left  ventricle has mildly decreased function. The left ventricle demonstrates  global hypokinesis. Left ventricular diastolic parameters are  indeterminate.   2. Right ventricular systolic function is mildly reduced. The right  ventricular size is normal. There is moderately elevated pulmonary artery  systolic pressure. The estimated right ventricular systolic pressure is  49.5 mmHg.   3. Left atrial size was moderately dilated.   4. S/p Mitraclip placement. The mitral valve has been repaired/replaced.  Mild mitral valve regurgitation. Mild mitral stenosis. The mean mitral  valve gradient is 4.0 mmHg.   5. The aortic valve is tricuspid. There is mild calcification of the  aortic valve. Aortic valve regurgitation is trivial. No aortic stenosis is  present.   6. Aortic dilatation noted. There is mild dilatation of the ascending  aorta, measuring 40 mm.   7. The inferior vena cava is normal in size with <50% respiratory  variability, suggesting right atrial pressure of 8 mmHg.     Neuro/Psych  PSYCHIATRIC DISORDERS  Depression     Neuromuscular disease     GI/Hepatic Neg liver ROS,GERD  ,,  Endo/Other  Hypothyroidism  Obesity   Renal/GU negative Renal ROS     Musculoskeletal  (+) Arthritis ,    Abdominal   Peds  Hematology  (+) Blood dyscrasia (Xarelto ), anemia   Anesthesia Other Findings Day of surgery medications reviewed with the patient.  Reproductive/Obstetrics                              Anesthesia Physical Anesthesia Plan  ASA: 4  Anesthesia Plan: General   Post-op Pain Management: Minimal or no pain anticipated   Induction: Intravenous  PONV Risk Score and Plan: 2 and TIVA  Airway Management Planned: Oral ETT  Additional Equipment: None  Intra-op Plan:   Post-operative Plan: Extubation in OR  Informed Consent: I have reviewed the patients History and Physical, chart, labs and discussed the procedure including the risks, benefits and alternatives for the proposed anesthesia with the patient or authorized representative who has indicated his/her understanding and acceptance.     Dental advisory given  Plan Discussed with: CRNA and Anesthesiologist  Anesthesia Plan Comments: (  )        Anesthesia Quick Evaluation

## 2024-02-13 ENCOUNTER — Encounter (HOSPITAL_COMMUNITY)
Admission: RE | Disposition: A | Discharge status: Home / Self Care | Payer: Self-pay | Source: Home / Self Care | Attending: Cardiology

## 2024-02-13 ENCOUNTER — Encounter (HOSPITAL_COMMUNITY): Payer: Self-pay | Admitting: Cardiology

## 2024-02-13 ENCOUNTER — Ambulatory Visit (HOSPITAL_COMMUNITY)
Admission: RE | Admit: 2024-02-13 | Discharge: 2024-02-13 | Disposition: A | Discharge status: Home / Self Care | Attending: Cardiology | Admitting: Cardiology

## 2024-02-13 ENCOUNTER — Ambulatory Visit (HOSPITAL_BASED_OUTPATIENT_CLINIC_OR_DEPARTMENT_OTHER): Payer: Self-pay | Admitting: Anesthesiology

## 2024-02-13 ENCOUNTER — Other Ambulatory Visit: Payer: Self-pay

## 2024-02-13 ENCOUNTER — Ambulatory Visit (HOSPITAL_COMMUNITY): Payer: Self-pay | Admitting: Anesthesiology

## 2024-02-13 DIAGNOSIS — E785 Hyperlipidemia, unspecified: Secondary | ICD-10-CM | POA: Diagnosis not present

## 2024-02-13 DIAGNOSIS — Z79899 Other long term (current) drug therapy: Secondary | ICD-10-CM | POA: Insufficient documentation

## 2024-02-13 DIAGNOSIS — N184 Chronic kidney disease, stage 4 (severe): Secondary | ICD-10-CM | POA: Insufficient documentation

## 2024-02-13 DIAGNOSIS — I110 Hypertensive heart disease with heart failure: Secondary | ICD-10-CM

## 2024-02-13 DIAGNOSIS — I5022 Chronic systolic (congestive) heart failure: Secondary | ICD-10-CM | POA: Diagnosis not present

## 2024-02-13 DIAGNOSIS — I2510 Atherosclerotic heart disease of native coronary artery without angina pectoris: Secondary | ICD-10-CM | POA: Diagnosis not present

## 2024-02-13 DIAGNOSIS — G4733 Obstructive sleep apnea (adult) (pediatric): Secondary | ICD-10-CM | POA: Diagnosis not present

## 2024-02-13 DIAGNOSIS — E039 Hypothyroidism, unspecified: Secondary | ICD-10-CM | POA: Insufficient documentation

## 2024-02-13 DIAGNOSIS — Z87891 Personal history of nicotine dependence: Secondary | ICD-10-CM | POA: Insufficient documentation

## 2024-02-13 DIAGNOSIS — I447 Left bundle-branch block, unspecified: Secondary | ICD-10-CM | POA: Diagnosis not present

## 2024-02-13 DIAGNOSIS — I509 Heart failure, unspecified: Secondary | ICD-10-CM | POA: Diagnosis not present

## 2024-02-13 DIAGNOSIS — I428 Other cardiomyopathies: Secondary | ICD-10-CM | POA: Diagnosis not present

## 2024-02-13 DIAGNOSIS — Z7901 Long term (current) use of anticoagulants: Secondary | ICD-10-CM | POA: Insufficient documentation

## 2024-02-13 DIAGNOSIS — K219 Gastro-esophageal reflux disease without esophagitis: Secondary | ICD-10-CM | POA: Diagnosis not present

## 2024-02-13 DIAGNOSIS — E669 Obesity, unspecified: Secondary | ICD-10-CM | POA: Diagnosis not present

## 2024-02-13 DIAGNOSIS — D6869 Other thrombophilia: Secondary | ICD-10-CM | POA: Insufficient documentation

## 2024-02-13 DIAGNOSIS — I252 Old myocardial infarction: Secondary | ICD-10-CM | POA: Diagnosis not present

## 2024-02-13 DIAGNOSIS — I4891 Unspecified atrial fibrillation: Secondary | ICD-10-CM | POA: Diagnosis not present

## 2024-02-13 DIAGNOSIS — I739 Peripheral vascular disease, unspecified: Secondary | ICD-10-CM | POA: Insufficient documentation

## 2024-02-13 DIAGNOSIS — Z95810 Presence of automatic (implantable) cardiac defibrillator: Secondary | ICD-10-CM | POA: Insufficient documentation

## 2024-02-13 DIAGNOSIS — Z6833 Body mass index (BMI) 33.0-33.9, adult: Secondary | ICD-10-CM | POA: Diagnosis not present

## 2024-02-13 DIAGNOSIS — I4819 Other persistent atrial fibrillation: Secondary | ICD-10-CM | POA: Insufficient documentation

## 2024-02-13 DIAGNOSIS — I5023 Acute on chronic systolic (congestive) heart failure: Secondary | ICD-10-CM | POA: Diagnosis not present

## 2024-02-13 HISTORY — PX: ATRIAL FIBRILLATION ABLATION: EP1191

## 2024-02-13 LAB — GLUCOSE, CAPILLARY
Glucose-Capillary: 122 mg/dL — ABNORMAL HIGH (ref 70–99)
Glucose-Capillary: 106 mg/dL — ABNORMAL HIGH (ref 70–99)

## 2024-02-13 SURGERY — ATRIAL FIBRILLATION ABLATION
Anesthesia: General

## 2024-02-13 MED ORDER — PHENYLEPHRINE HCL-NACL 20-0.9 MG/250ML-% IV SOLN
INTRAVENOUS | Status: DC | PRN
  Administered 2024-02-13: 20 ug/min via INTRAVENOUS

## 2024-02-13 MED ORDER — CEFAZOLIN SODIUM-DEXTROSE 2-3 GM-%(50ML) IV SOLR
INTRAVENOUS | Status: DC | PRN
  Administered 2024-02-13: 2 g via INTRAVENOUS

## 2024-02-13 MED ORDER — FENTANYL CITRATE (PF) 100 MCG/2ML IJ SOLN
INTRAMUSCULAR | Status: AC
  Filled 2024-02-13: qty 2

## 2024-02-13 MED ORDER — ONDANSETRON HCL 4 MG/2ML IJ SOLN
4.0000 mg | Freq: Four times a day (QID) | INTRAMUSCULAR | Status: DC | PRN
Start: 2024-02-13 — End: 2024-02-13

## 2024-02-13 MED ORDER — HEPARIN SODIUM (PORCINE) 1000 UNIT/ML IJ SOLN
INTRAMUSCULAR | Status: DC | PRN
  Administered 2024-02-13: 14000 [IU] via INTRAVENOUS

## 2024-02-13 MED ORDER — PHENYLEPHRINE 80 MCG/ML (10ML) SYRINGE FOR IV PUSH (FOR BLOOD PRESSURE SUPPORT)
PREFILLED_SYRINGE | INTRAVENOUS | Status: DC | PRN
  Administered 2024-02-13: 160 ug via INTRAVENOUS
  Administered 2024-02-13 (×2): 80 ug via INTRAVENOUS

## 2024-02-13 MED ORDER — LIDOCAINE 2% (20 MG/ML) 5 ML SYRINGE
INTRAMUSCULAR | Status: DC | PRN
  Administered 2024-02-13: 60 mg via INTRAVENOUS

## 2024-02-13 MED ORDER — SODIUM CHLORIDE 0.9 % IV SOLN: INTRAVENOUS | Status: DC

## 2024-02-13 MED ORDER — SODIUM CHLORIDE 0.9% FLUSH: 3.0000 mL | INTRAVENOUS | Status: DC | PRN

## 2024-02-13 MED ORDER — SODIUM CHLORIDE 0.9 % IV SOLN: 250.0000 mL | INTRAVENOUS | Status: DC | PRN

## 2024-02-13 MED ORDER — ATROPINE SULFATE 1 MG/10ML IJ SOSY
PREFILLED_SYRINGE | INTRAMUSCULAR | Status: AC
  Filled 2024-02-13: qty 10

## 2024-02-13 MED ORDER — ACETAMINOPHEN 325 MG PO TABS: 650.0000 mg | ORAL_TABLET | ORAL | Status: DC | PRN

## 2024-02-13 MED ORDER — HEPARIN (PORCINE) IN NACL 2000-0.9 UNIT/L-% IV SOLN
INTRAVENOUS | Status: DC | PRN
  Administered 2024-02-13 (×4): 1000 mL

## 2024-02-13 MED ORDER — EPHEDRINE SULFATE-NACL 50-0.9 MG/10ML-% IV SOSY: PREFILLED_SYRINGE | INTRAVENOUS | Status: DC | PRN

## 2024-02-13 MED ORDER — ROCURONIUM BROMIDE 10 MG/ML (PF) SYRINGE
PREFILLED_SYRINGE | INTRAVENOUS | Status: DC | PRN
  Administered 2024-02-13: 60 mg via INTRAVENOUS

## 2024-02-13 MED ORDER — SUGAMMADEX SODIUM 200 MG/2ML IV SOLN
INTRAVENOUS | Status: DC | PRN
  Administered 2024-02-13: 200 mg via INTRAVENOUS

## 2024-02-13 MED ORDER — PROTAMINE SULFATE 10 MG/ML IV SOLN
INTRAVENOUS | Status: DC | PRN
  Administered 2024-02-13: 20 mg via INTRAVENOUS
  Administered 2024-02-13: 40 mg via INTRAVENOUS

## 2024-02-13 MED ORDER — FENTANYL CITRATE (PF) 250 MCG/5ML IJ SOLN
INTRAMUSCULAR | Status: DC | PRN
  Administered 2024-02-13: 25 ug via INTRAVENOUS

## 2024-02-13 MED ORDER — CEFAZOLIN SODIUM-DEXTROSE 2-4 GM/100ML-% IV SOLN
INTRAVENOUS | Status: AC
  Filled 2024-02-13: qty 100

## 2024-02-13 MED ORDER — ONDANSETRON HCL 4 MG/2ML IJ SOLN
INTRAMUSCULAR | Status: DC | PRN
  Administered 2024-02-13: 4 mg via INTRAVENOUS

## 2024-02-13 MED ORDER — PROPOFOL 10 MG/ML IV BOLUS
INTRAVENOUS | Status: DC | PRN
  Administered 2024-02-13: 120 mg via INTRAVENOUS

## 2024-02-13 SURGICAL SUPPLY — 16 items
BAG SNAP BAND KOVER 36X36 (MISCELLANEOUS) IMPLANT
CATH GE 8FR SOUNDSTAR (CATHETERS) IMPLANT
CATH OCTARAY 2.0 F 3-3-3-3-3 (CATHETERS) IMPLANT
CLOSURE MYNX CONTROL 6F/7F (Vascular Products) IMPLANT
CLOSURE PERCLOSE PROSTYLE (VASCULAR PRODUCTS) IMPLANT
COVER SWIFTLINK CONNECTOR (BAG) ×1 IMPLANT
DILATOR VESSEL 38 20CM 16FR (INTRODUCER) IMPLANT
GUIDEWIRE INQWIRE 1.5J.035X260 (WIRE) IMPLANT
KIT VERSACROSS CNCT FARADRIVE (KITS) IMPLANT
PACK EP LF (CUSTOM PROCEDURE TRAY) ×1 IMPLANT
PAD DEFIB RADIO PHYSIO CONN (PAD) ×1 IMPLANT
PATCH CARTO3 (PAD) IMPLANT
SHEATH AVANTI 11CM 9FR (SHEATH) IMPLANT
SHEATH FARADRIVE STEERABLE (SHEATH) IMPLANT
SHEATH PINNACLE 8F 10CM (SHEATH) IMPLANT
SHEATH PROBE COVER 6X72 (BAG) IMPLANT

## 2024-02-13 NOTE — Anesthesia Postprocedure Evaluation (Signed)
 Anesthesia Post Note  Patient: Chad Brown  Procedure(s) Performed: ATRIAL FIBRILLATION ABLATION     Patient location during evaluation: PACU Anesthesia Type: General Level of consciousness: awake and alert Pain management: pain level controlled Vital Signs Assessment: post-procedure vital signs reviewed and stable Respiratory status: spontaneous breathing, nonlabored ventilation, respiratory function stable and patient connected to nasal cannula oxygen Cardiovascular status: blood pressure returned to baseline and stable Postop Assessment: no apparent nausea or vomiting Anesthetic complications: no   There were no known notable events for this encounter.  Last Vitals:  Vitals:   02/13/24 1000 02/13/24 1005  BP: 114/73 106/77  Pulse: 80 80  Resp: 12 12  Temp:    SpO2: 99% 98%    Last Pain:  Vitals:   02/13/24 0920  TempSrc: Oral  PainSc: 0-No pain   Pain Goal:                   [name]

## 2024-02-13 NOTE — Interval H&P Note (Signed)
 History and Physical Interval Note:  02/13/2024 7:04 AM  Chad Brown  has presented today for surgery, with the diagnosis of afib.  The various methods of treatment have been discussed with the patient and family. After consideration of risks, benefits and other options for treatment, the patient has consented to  Procedure(s): ATRIAL FIBRILLATION ABLATION (N/A) as a surgical intervention.  The patient's history has been reviewed, patient examined, no change in status, stable for surgery.  I have reviewed the patient's chart and labs.  Questions were answered to the patient's satisfaction.     Ritchie Klee [employer]

## 2024-02-13 NOTE — Transfer of Care (Signed)
 Immediate Anesthesia Transfer of Care Note  Patient: Chad Brown  Procedure(s) Performed: ATRIAL FIBRILLATION ABLATION  Patient Location: PACU  Anesthesia Type:General  Level of Consciousness: patient cooperative  Airway & Oxygen Therapy: Patient connected to face mask oxygen  Post-op Assessment: Post -op Vital signs reviewed and stable  Post vital signs: stable  Last Vitals:  Vitals Value Taken Time  BP 111/74 02/13/24 0920  Temp    Pulse 80 02/13/24 0920  Resp 13 02/13/24 0920  SpO2 99 % 02/13/24 0920  Vitals shown include unfiled device data.  Last Pain:  Vitals:   02/13/24 0602  TempSrc:   PainSc: 0-No pain         Complications: There were no known notable events for this encounter.

## 2024-02-13 NOTE — Progress Notes (Signed)
  Pt arrived from EP via Bed to HA. Report received from RN and CRNA. Pt vitals are stable, performed q18min see vitals flowsheet. Anesthesia recovery has been uneventful. Pt to transition to short stay for remainder of recovery. Pt ablation aborted after mapping found no pathways. Pt connected to [employer] and [employer] while waiting for rep to reprogram therapy of device. Device is currently under reprogramming by rep. Will continue to monitor patient while under holding area care.

## 2024-02-13 NOTE — Progress Notes (Signed)
 Patient and wife was given discharge instructions. Both verbalized understanding.

## 2024-02-13 NOTE — Anesthesia Procedure Notes (Signed)
 Procedure Name: Intubation Date/Time: 02/13/2024 7:45 AM  Performed by: Claybon Cuna, CRNAPre-anesthesia Checklist: Patient identified, Emergency Drugs available, Suction available and Patient being monitored Patient Re-evaluated:Patient Re-evaluated prior to induction Oxygen Delivery Method: Circle System Utilized Preoxygenation: Pre-oxygenation with 100% oxygen Induction Type: IV induction Ventilation: Mask ventilation without difficulty Laryngoscope Size: Mac and 3 Grade View: Grade I Tube type: Oral Tube size: 7.5 mm Number of attempts: 1 Airway Equipment and Method: Stylet and Oral airway Placement Confirmation: ETT inserted through vocal cords under direct vision, positive ETCO2 and breath sounds checked- equal and bilateral Secured at: 23 cm Tube secured with: Tape Dental Injury: Teeth and Oropharynx as per pre-operative assessment

## 2024-02-14 ENCOUNTER — Telehealth (HOSPITAL_COMMUNITY): Payer: Self-pay

## 2024-02-14 LAB — POCT ACTIVATED CLOTTING TIME: Activated Clotting Time: 320 s

## 2024-02-14 MED FILL — Cefazolin Sodium-Dextrose IV Solution 2 GM/100ML-4%: INTRAVENOUS | Qty: 100 | Status: AC

## 2024-02-14 MED FILL — Fentanyl Citrate Preservative Free (PF) Inj 100 MCG/2ML: INTRAMUSCULAR | Qty: 0.5 | Status: AC

## 2024-02-14 MED FILL — Atropine Sulfate Soln Prefill Syr 1 MG/10ML (0.1 MG/ML): INTRAMUSCULAR | Qty: 10 | Status: AC

## 2024-02-14 NOTE — Telephone Encounter (Signed)
 Spoke with patient to complete post procedure follow up call.   Patient reports on the way home, he coughed and had a large amount of bleeding from his right groin. States he held pressure the entire ride home, approximately 25 minutes and bleeding stopped. States the right groin bandage fell off from it being saturated. He denies any additional bleeding or concerns since. Instructed patient to hold firm pressure over groin sites when coughing to prevent possible bleeding. Get help right away if your puncture site is bleeding and the bleeding does not stop after applying firm pressure to the area. Pt voiced understanding  Instructions reviewed with patient:  Remove large bandage at puncture site after 24 hours. It is normal to have bruising, tenderness and a pea or marble sized lump/knot at the groin site which can take up to three months to resolve.  Get help right away if you notice sudden swelling at the puncture site.  Check your puncture site every day for signs of infection: fever, redness, swelling, pus drainage, warmth, foul odor or excessive pain. If this occurs, please call the office at [phone], to speak with the nurse. You may continue to have skipped beats/ atrial fibrillation during the first several months after your procedure.  It is very important not to miss any doses of your blood thinner Xarelto . Patient restarted taking this medication on 02/13/24.   You will follow up with the Afib clinic on 03/13/24 and follow up with the APP on 05/13/24.   Patient verbalized understanding to all instructions provided.

## 2024-02-19 DIAGNOSIS — L570 Actinic keratosis: Secondary | ICD-10-CM | POA: Diagnosis not present

## 2024-02-19 DIAGNOSIS — L820 Inflamed seborrheic keratosis: Secondary | ICD-10-CM | POA: Diagnosis not present

## 2024-02-19 DIAGNOSIS — X32XXXD Exposure to sunlight, subsequent encounter: Secondary | ICD-10-CM | POA: Diagnosis not present

## 2024-02-19 DIAGNOSIS — D0422 Carcinoma in situ of skin of left ear and external auricular canal: Secondary | ICD-10-CM | POA: Diagnosis not present

## 2024-02-27 ENCOUNTER — Encounter: Payer: Self-pay | Admitting: Emergency Medicine

## 2024-03-03 ENCOUNTER — Other Ambulatory Visit: Payer: Self-pay | Admitting: Cardiology

## 2024-03-09 ENCOUNTER — Ambulatory Visit: Attending: Cardiology

## 2024-03-09 DIAGNOSIS — I5022 Chronic systolic (congestive) heart failure: Secondary | ICD-10-CM

## 2024-03-09 DIAGNOSIS — Z95810 Presence of automatic (implantable) cardiac defibrillator: Secondary | ICD-10-CM

## 2024-03-10 NOTE — Progress Notes (Signed)
 EPIC Encounter for ICM Monitoring  Patient Name: Chad Brown is a 83 y.o. male Date: 03/10/2024 Primary Care Physican: Jeannine Milroy., MD Primary Cardiologist: Nishan/McLean Electrophysiologist: Morrell Aran Pacing: 71.2 % (decreased from 90.4%)  10/23/2023 Weight: 198 lbs 11/27/2023 Weight: 200 lbs 02/03/2024 Weight: 203 lbs 01/30/2024 Weight: 206 lbs 03/09/2024 Weight: 206 lbs 03/10/2024 Weight: 204 lbs    Since 29-Feb-2024 Time in AT/AF 19.0 hours/day (79.3 %) Treated AT/AF 23   Spoke with patient and heart failure questions reviewed.  Transmission results reviewed.  Pt asymptomatic for fluid accumulation.  Pt ate [place] food 6/7 and [place] food 6/8 which most likely contributed to the slight fluid accumulation.  Ablation was not done due to he has a lot of heart scar tissue.   Optivol thoracic impedance trending slightly below baseline.  Pt is followed by [facility] Clinic.  Message sent to device clinic triage to review report d/t AT/AF percentage increase and decrease in pacing since 5/15 Atrial Fibrillation.   Prescribed:  Torsemide  20 mg take 2 tablets (40 mg total) by mouth twice a day Potassium 20 mEq take 1 tablet by mouth daily   Labs: 01/31/2024 Creatinine 2.74, BUN 40, Potassium 4.5, Sodium 139 01/20/2024 Creatinine 2.94, BUN 50, Potassium 5.0, Sodium 146, GFR 20 11/28/2023 Creatinine 2.77, BUN 43, Potassium 4.4, Sodium 139, GFR 22 10/07/2023 Creatinine 2.42, BUN 42, Potassium 4.6, Sodium 141, GFR 26 09/26/2023 Creatinine 2.62, BUN 46, Potassium 4.5, Sodium 140, GFR 24 09/06/2023 Creatinine 2.25, BUN 36, Potassium 4.4, Sodium 143, GFR 28 08/22/2023 Creatinine 2.44, BUN 43, Potassium 4.6, Sodium 134, GFR 26   A complete set of results can be found in Results Review.   Recommendations:   Advise to limit salt intake and encouraged to call if experiencing any fluid symptoms.   Follow-up plan: ICM clinic phone appointment on 04/27/2024.  91 day device clinic remote  transmission 03/26/2024.     EP/Cardiology Next Office Visit:  05/13/2024 with Mertha Abrahams, PA.  Afib clinic 03/12/2024.   Recall 01/24/2024 with Dr Mitzie Anda (4 month f/u).   Copy of ICM check sent to Dr. Lawana Pray.     3 month ICM trend: 03/08/2024.    12-14 Month ICM trend:     Almyra Jain, RN 03/10/2024 7:53 AM

## 2024-03-12 ENCOUNTER — Encounter (HOSPITAL_COMMUNITY): Payer: Self-pay

## 2024-03-12 ENCOUNTER — Ambulatory Visit (HOSPITAL_COMMUNITY)
Admission: RE | Admit: 2024-03-12 | Discharge: 2024-03-12 | Disposition: A | Discharge status: Home / Self Care | Source: Ambulatory Visit | Attending: Internal Medicine | Admitting: Internal Medicine

## 2024-03-12 ENCOUNTER — Ambulatory Visit (HOSPITAL_COMMUNITY): Admitting: Internal Medicine

## 2024-03-12 VITALS — BP 100/64 | HR 94 | Ht 66.0 in | Wt 210.2 lb

## 2024-03-12 DIAGNOSIS — I4819 Other persistent atrial fibrillation: Secondary | ICD-10-CM | POA: Diagnosis not present

## 2024-03-12 DIAGNOSIS — I4891 Unspecified atrial fibrillation: Secondary | ICD-10-CM

## 2024-03-12 DIAGNOSIS — D6869 Other thrombophilia: Secondary | ICD-10-CM

## 2024-03-12 DIAGNOSIS — Z79899 Other long term (current) drug therapy: Secondary | ICD-10-CM

## 2024-03-12 DIAGNOSIS — Z5181 Encounter for therapeutic drug level monitoring: Secondary | ICD-10-CM

## 2024-03-12 NOTE — Progress Notes (Incomplete)
 Primary Care Physician: Jeannine Milroy., MD Primary Cardiologist: [facility] 2: Dr Mitzie Anda Primary Electrophysiologist: Dr Lawana Pray Referring Physician: Dr Caleb Castor is a 83 y.o. male with a history of persistent atrial fibrillation, systolic CHF, CKD, OSA, HLD, who presents for follow up in the [facility].  The patient was initially diagnosed with atrial fibrillation 12/2012. Patient is on Xarelto  for a CHADS2VASC score of 4. Echo showed EF 30-35%.  He had DCCV back to NSR.  He was back in atrial fibrillation in 1/16, and EF was low again on TEE at that time and he had DCCV.  In 5/16, cardiac MRI showed persistently low EF, so given LBBB, he had [employer] CRT-D device placed.  He was back in atrial fibrillation in 6/16 and had TEE-guided DCCV. Patient noted to be back in afib 10/2019 despite amiodarone  therapy. He underwent afib ablation with Dr Nunzio Belch on 11/03/19. Patient reports that he has done well since the procedure. His weight has been stable and no lower extremity edema. Patient does report that he fell about 3 weeks ago and hit the side of his face on the trash can. He did not seek evaluation for this. No presyncope, loss of consciousness, or residual symptoms. He denies chest pain, swallowing, or groin issues.  On follow up 5/8, he is currently in SR today. Recent review of device on 4/29 showed 30 episodes of AF with 6% burden. Patient notes that when he was in episodes would feel anxious, weak, and dizzy. No shortness of breath or chest pain with episodes.   On follow up 09/06/23, he is currently in AV paced rhythm. Admission for diuresis/volume optimization secondary to acute on chronic HF with reduced EF 11/4-8/24. Admission 11/19-21/24 for severe mitral regurgitation s/p transcatheter edge to edge mitral valve repair. He was noted to be in rapid Afib at admission and started on IV amiodarone . Discharged on amiodarone  200 mg TID then to 200 mg  BID. He has noted no episodes of Afib since hospital discharge on 11/21. He feels better since mitral valve repair, notes to be a little less SOB, and has a little bit more energy. He is currently taking amiodarone  200 mg BID. No missed doses of Xarelto  15 mg daily. He shows me a log of daily weights and for December has been in between 204.4 and 205.6 lbs (today 205 lbs).   On follow up 10/23/23, he is currently in Afib. Patient contacted office today noting he has not felt well the past 3 days; device transmission showed Afib with controlled VR. He notes to feel weak and dizzy at times when out of rhythm. No missed doses of Xarelto .   On follow up 11/12/23, he is currently in AV paced rhythm. He was seen on 1/22 noted to be in Afib and amiodarone  was increased to 200 mg BID. He was scheduled for cardioversion on 1/27 and noted to be in AV paced rhythm so procedure was canceled. He is currently taking amiodarone  200 mg BID. He feels better in normal rhythm. No missed doses of Xarelto  15 mg daily.   On follow up 12/11/23, he is currently in V paced rhythm. He contacted office today noting he felt dizzy and likely out of rhythm. Device clinic notes 100% Afib burden since 3/11. He is on amiodarone  200 mg daily and Xarelto  15 mg daily. He is dizzy and feels SOB at times; it is noted when he is out of rhythm he  becomes hypotensive.   On follow up 03/12/24, patient is currently in ***. S/p Afib ablation on 02/13/24 by Dr. Lawana Pray. No episodes of Afib since ablation. He is taking amiodarone  200 mg daily. No chest pain or SOB. Leg sites healed without issue. No missed doses of Xarelto  15 mg daily.   Today, he denies symptoms of orthopnea, PND, lower extremity edema, dizziness, presyncope, syncope, snoring, daytime somnolence, bleeding, or neurologic sequela. The patient is tolerating medications without difficulties and is otherwise without complaint today.     Atrial Fibrillation Risk Factors:  he does have  symptoms or diagnosis of sleep apnea. he is compliant with CPAP therapy. he does not have a history of rheumatic fever.  he has a BMI of There is no height or weight on file to calculate BMI.. There were no vitals filed for this visit.   Family History  Problem Relation Age of Onset   Kidney disease Mother    Heart disease Mother        MI, open heart   Diabetes Mother        dialysis   Leukemia Father    Colon cancer Paternal Uncle    Lung cancer Paternal Uncle        x 2   Prostate cancer Paternal Uncle    Diabetes Maternal Grandmother    Heart attack Maternal Uncle    Diabetes Maternal Aunt        x 3   Diabetes Maternal Uncle    Thyroid  disease Sister     Atrial Fibrillation Management history:  Previous antiarrhythmic drugs: amiodarone  Previous cardioversions: several, most recently 2018 Previous ablations: 11/03/19, 02/13/24 CHADS2VASC score: 4 Anticoagulation history: Xarelto    Past Medical History:  Diagnosis Date   Adenomatous colon polyp    tubular   AICD (automatic cardioverter/defibrillator) present 03/08/2015   MDT CRTD dual pacemaker and defib   Anemia    iron  deficient   Arthritis    about all my joints; hands, knees, back (03/08/2015)   Atherosclerosis    Cataract    left eye small   Cholelithiasis    gallstones   Chronic systolic CHF (congestive heart failure) (HCC)    a. New dx 12/2012 ? NICM, may be r/t afib. b. Nuc 03/2013 - normal;  c. 03/2015 TEE EF 15-20%.   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 Dx 12/2012, s/p TEE/DCCV 01/26/13. b. On Xarelto  (CHA2DS2VASc = 3);  c. 03/2015 TEE (EF 15-20%, no LAA thrombus) and DCCV - amio increased to 200 mg bid.   Pneumonia    S/P mitral valve clip implantation 08/21/2023   Placement of 1 XTW in the A2/P2 position and 1 NTW in the A1/P1 position by Dr. Lorie Rook and Dr. Arlester Ladd   Urinary hesitancy due to benign prostatic hypertrophy    Past Surgical History:  Procedure Laterality Date   ATRIAL FIBRILLATION ABLATION  N/A 11/03/2019   Procedure: ATRIAL FIBRILLATION ABLATION;  Surgeon: Jolly Needle, MD;  Location: [facility] INVASIVE CV LAB;  Service: Cardiovascular;  Laterality: N/A;   ATRIAL FIBRILLATION ABLATION N/A 02/13/2024   Procedure: ATRIAL FIBRILLATION ABLATION;  Surgeon: Lei Pump, MD;  Location: [facility] INVASIVE CV LAB;  Service: Cardiovascular;  Laterality: N/A;   BACK SURGERY     upper back, cannot turn neck well   BIV ICD GENERATOR CHANGEOUT N/A 09/16/2023   Procedure: BIV ICD GENERATOR CHANGEOUT;  Surgeon: Lei Pump, MD;  Location[facility] INVASIVE CV LAB;  Service: Cardiovascular;  Laterality: N/A;   CARDIAC CATHETERIZATION  1998   CARDIOVERSION N/A 01/26/2013   Procedure: CARDIOVERSION;  Surgeon: Lenise Quince, MD;  Location: [facility] ENDOSCOPY;  Service: Cardiovascular;  Laterality: N/A;   CARDIOVERSION N/A 03/23/2015   Procedure: CARDIOVERSION;  Surgeon: Hugh Madura, MD;  Location: [facility] ENDOSCOPY;  Service: Cardiovascular;  Laterality: N/A;   CARDIOVERSION N/A 08/14/2017   Procedure: CARDIOVERSION;  Surgeon: Loyde Rule, MD;  Location: [facility] ENDOSCOPY;  Service: Cardiovascular;  Laterality: N/A;   CATARACT EXTRACTION Right ~ 2006   COLONOSCOPY WITH PROPOFOL  N/A 02/10/2015   Procedure: COLONOSCOPY WITH PROPOFOL ;  Surgeon: Kenney Peacemaker, MD;  Location: [facility] ENDOSCOPY;  Service: Endoscopy;  Laterality: N/A;   COLONOSCOPY WITH PROPOFOL  N/A 08/07/2016   Procedure: COLONOSCOPY WITH  PROPOFOL ;  Surgeon: Kenney Peacemaker, MD;  Location: [facility] ENDOSCOPY;  Service: Endoscopy;  Laterality: N/A;   ENTEROSCOPY N/A 08/17/2015   Procedure: ENTEROSCOPY;  Surgeon: Kenney Peacemaker, MD;  Location: [facility] ENDOSCOPY;  Service: Endoscopy;  Laterality: N/A;   EP IMPLANTABLE DEVICE N/A 03/08/2015   MDT Kenna Payor CRT-D for nonischemic CM by Dr Nunzio Belch for primary prevention   GLAUCOMA SURGERY Right ~ 2006   put 3 stents in to drain fluid (03/08/2015) not successful, sent to [facility] to try to get last stent out   HOT HEMOSTASIS N/A 08/07/2016   Procedure: HOT HEMOSTASIS (ARGON PLASMA COAGULATION/BICAP);  Surgeon: Kenney Peacemaker, MD;  Location: [facility] ENDOSCOPY;  Service: Endoscopy;  Laterality: N/A;   INCISION AND DRAINAGE ABSCESS POSTERIOR CERVICALSPINE  05/2012   JOINT REPLACEMENT     MELANOMA EXCISION Left 2015   lower leg; right at my knee   REFRACTIVE SURGERY Right ~ 2006 X 2   twice; both done at [facility] (03/08/2015   RIGHT HEART CATH N/A 08/05/2023   Procedure: RIGHT HEART CATH;  Surgeon: Darlis Eisenmenger, MD;  Location: [facility] INVASIVE CV LAB;  Service: Cardiovascular;  Laterality: N/A;   SURGERY SCROTAL / TESTICULAR Right 1990's   TEE WITHOUT CARDIOVERSION N/A 01/26/2013   Procedure: TRANSESOPHAGEAL ECHOCARDIOGRAM (TEE);  Surgeon: Lenise Quince, MD;  Location: [facility] ENDOSCOPY;  Service: Cardiovascular;  Laterality: N/A;  [name] anes. /    TEE WITHOUT CARDIOVERSION N/A 10/05/2014   Procedure: TRANSESOPHAGEAL ECHOCARDIOGRAM (TEE)  with cardioversion;  Surgeon: Lake Pilgrim, MD;  Location: [facility] ENDOSCOPY;  Service: Cardiovascular;  Laterality: N/A;  12:52 synched cardioversion at 120 joules,...afib to SR...12 lead EKG ordered.Aaron AasAaron AasCardiozem d/c'ed per MD verbal order at SR   TEE WITHOUT CARDIOVERSION N/A 03/23/2015   Procedure: TRANSESOPHAGEAL ECHOCARDIOGRAM (TEE);  Surgeon: Hugh Madura, MD;  Location: [facility] ENDOSCOPY;  Service: Cardiovascular;  Laterality: N/A;   TEE WITHOUT CARDIOVERSION N/A 11/02/2019   Procedure:  TRANSESOPHAGEAL ECHOCARDIOGRAM (TEE);  Surgeon: Loyde Rule, MD;  Location: [facility] ENDOSCOPY;  Service: Cardiovascular;  Laterality: N/A;   TEE WITHOUT CARDIOVERSION N/A 08/05/2023   Procedure: TRANSESOPHAGEAL ECHOCARDIOGRAM;  Surgeon:  Darlis Eisenmenger, MD;  Location: [facility] INVASIVE CV LAB;  Service: Cardiovascular;  Laterality: N/A;   THORACIC SPINE SURGERY  03/2000   ground calcium  deposits from upper thoracic (01/26/2013)   THYROIDECTOMY N/A 05/11/2021   Procedure: TOTAL THYROIDECTOMY;  Surgeon: Oralee Billow, MD;  Location: [facility] ORS;  Service: General;  Laterality: N/A;   TOTAL HIP ARTHROPLASTY Right 06/2007   TRANSCATHETER MITRAL EDGE TO EDGE REPAIR N/A 08/21/2023   Procedure: TRANSCATHETER MITRAL EDGE TO EDGE REPAIR;  Surgeon: Thukkani, Arun K, MD;  Location: [facility] INVASIVE CV LAB;  Service: Cardiovascular;  Laterality: N/A;   TRANSESOPHAGEAL ECHOCARDIOGRAM (CATH LAB) N/A 08/09/2023   Procedure: TRANSESOPHAGEAL ECHOCARDIOGRAM;  Surgeon: Darlis Eisenmenger, MD;  [facility] INVASIVE CV LAB;  Service: Cardiovascular;  Laterality: N/A;   TRANSESOPHAGEAL ECHOCARDIOGRAM (CATH LAB) N/A 08/21/2023   Procedure: TRANSESOPHAGEAL ECHOCARDIOGRAM;  Surgeon: Kyra Phy, MD;  Location: [facility] INVASIVE CV LAB;  Service: Cardiovascular;  Laterality: N/A;    Current Outpatient Medications  Medication Sig Dispense Refill   acetaminophen  (TYLENOL ) 500 MG tablet Take 1,000 mg by mouth 2 (two) times daily.     amiodarone  (PACERONE ) 200 MG tablet Take 1 tablet (200 mg total) by mouth daily.     Ascorbic Acid (VITAMIN C GUMMIE PO) Take 1 tablet by mouth in the morning.     calcium  carbonate (TUMS EX) 750 MG chewable tablet Chew 1 tablet by mouth daily.     cholecalciferol (VITAMIN D3) 25 MCG (1000 UNIT) tablet Take 1,000 Units by mouth in the morning.     dorzolamide -timolol  (COSOPT ) 22.3-6.8 MG/ML ophthalmic solution Place 1 drop into the right eye 2 (two) times daily.     empagliflozin  (JARDIANCE ) 10 MG TABS tablet TAKE  ONE TABLET DAILY 30 tablet 2   escitalopram  (LEXAPRO ) 20 MG tablet Take 1 tablet (20 mg total) by mouth daily. (Patient taking differently: Take 20 mg by mouth daily in the afternoon.) 90 tablet 0   Evolocumab  (REPATHA  SURECLICK) 140 MG/ML SOAJ INJECT 140MG ( ) INTO SKIN EVERY 2 WEEKS 6 mL 3   Ferrous Sulfate  (IRON  PO) Take 9 mg by mouth in the morning. Gummie     finasteride  (PROSCAR ) 5 MG tablet Take 1 tablet (5 mg total) by mouth daily. (Patient taking differently: Take 5 mg by mouth at bedtime.) 30 tablet 2   FOLIC ACID  PO Take 400 mcg by mouth in the morning.     gabapentin  (NEURONTIN ) 100 MG capsule Take 200 mg by mouth 2 (two) times daily.      latanoprost  (XALATAN ) 0.005 % ophthalmic solution Place 1 drop into the right eye daily at 12 noon.     levothyroxine  (SYNTHROID ) 137 MCG tablet TAKE ONE TABLET DAILY BEFORE BREAKFAST 90 tablet 1   Polyethyl Glycol-Propyl Glycol 0.4-0.3 % SOLN Apply 1 drop to eye 3 (three) times daily as needed (dry/irritated eyes.). Systane     potassium chloride  SA (KLOR-CON  M) 20 MEQ tablet Take 1 tablet (20 mEq total) by mouth daily. 30 tablet 6   RHOPRESSA  0.02 % SOLN Place 1 drop into the right eye at bedtime.     tamsulosin  (FLOMAX ) 0.4 MG CAPS Take 0.4 mg by mouth at bedtime.      torsemide  (DEMADEX ) 20 MG tablet Take 2 tablets (40 mg total) by mouth 2 (two) times daily.     XARELTO  15 MG TABS tablet TAKE ONE TABLET DAILY WITH SUPPER 90 tablet 1   No current facility-administered medications for this visit.    Allergies  Allergen Reactions   Pravastatin  Sodium Other (See Comments)    Joint and muscle pain   Azithromycin  Diarrhea    ROS- All systems are reviewed and negative except as per the HPI above.  Physical Exam: There were no vitals filed for this visit.  GEN- The patient is well appearing, alert and oriented x 3 today.   Neck - no JVD or carotid bruit noted Lungs- Clear to ausculation bilaterally, normal work of breathing Heart-  ***Regular rate and rhythm, no murmurs, rubs or gallops, PMI not laterally displaced Extremities- no clubbing, cyanosis, or edema Skin - no rash or ecchymosis noted   Wt Readings from Last 3 Encounters:  02/13/24 93.9 kg  01/20/24 93.4 kg  12/17/23 95.5 kg    EKG today demonstrates ***  Echo 02/13/23:  1. Left ventricular ejection fraction, by estimation, is 50%. The left  ventricle has mildly decreased function. The left ventricle demonstrates  global hypokinesis. Left ventricular diastolic parameters are  indeterminate.   2. Right ventricular systolic function is mildly reduced. The right  ventricular size is normal. There is moderately elevated pulmonary artery  systolic pressure. The estimated right ventricular systolic pressure is  49.5 mmHg.   3. Left atrial size was moderately dilated.   4. S/p Mitraclip placement. The mitral valve has been repaired/replaced.  Mild mitral valve regurgitation. Mild mitral stenosis. The mean mitral  valve gradient is 4.0 mmHg.   5. The aortic valve is tricuspid. There is mild calcification of the  aortic valve. Aortic valve regurgitation is trivial. No aortic stenosis is  present.   6. Aortic dilatation noted. There is mild dilatation of the ascending  aorta, measuring 40 mm.   7. The inferior vena cava is normal in size with <50% respiratory  variability, suggesting right atrial pressure of 8 mmHg.   Epic records are reviewed at length today  CHA2DS2-VASc Score = 4  The patient's score is based upon: CHF History: 1 HTN History: 1 Diabetes History: 0 Stroke History: 0 Vascular Disease History: 0 Age Score: 2 Gender Score: 0       ASSESSMENT AND PLAN: 1. Persistent Atrial Fibrillation (ICD10:  I48.19) The patient's CHA2DS2-VASc score is 4 , indicating a  4.0% annual risk of stroke.   S/p afib ablation 11/03/19. S/p afib ablation 02/13/24 by Dr. Lawana Pray.  He is currently in ***.   High risk medication monitoring (ICD10:  [id number]) Patient requires ongoing monitoring for anti-arrhythmic medication which has the potential to cause life threatening arrhythmias or AV block. Qtc stable. Continue amiodarone  200 mg daily.   2. Secondary Hypercoagulable State (ICD10:  D68.69) The patient is at significant risk for stroke/thromboembolism based upon his CHA2DS2-VASc Score of 4 .  Continue Rivaroxaban  (Xarelto ).  No missed doses.  3. Chronic systolic CHF Euvolemic today.   Follow up with EP as scheduled.   Minnie Amber, PA-C [facility] [address] [place], [place] [address] [phone] 03/12/2024 10:08 AM

## 2024-03-12 NOTE — Progress Notes (Signed)
 Primary Care Physician: Jeannine Milroy., MD Primary Cardiologist: Stann Earnest [facility]: Dr Mitzie Anda Primary Electrophysiologist: Dr Lawana Pray Referring Physician: Dr Caleb Castor is a 83 y.o. male with a history of persistent atrial fibrillation, systolic CHF, CKD, OSA, HLD, who presents for follow up in the [facility].  The patient was initially diagnosed with atrial fibrillation 12/2012. Patient is on Xarelto  for a CHADS2VASC score of 4. Echo showed EF 30-35%.  He had DCCV back to NSR.  He was back in atrial fibrillation in 1/16, and EF was low again on TEE at that time and he had DCCV.  In 5/16, cardiac MRI showed persistently low EF, so given LBBB, he had [employer] CRT-D device placed.  He was back in atrial fibrillation in 6/16 and had TEE-guided DCCV. Patient noted to be back in afib 10/2019 despite amiodarone  therapy. He underwent afib ablation with Dr Nunzio Belch on 11/03/19. Patient reports that he has done well since the procedure. His weight has been stable and no lower extremity edema. Patient does report that he fell about 3 weeks ago and hit the side of his face on the trash can. He did not seek evaluation for this. No presyncope, loss of consciousness, or residual symptoms. He denies chest pain, swallowing, or groin issues.  On follow up 5/8, he is currently in SR today. Recent review of device on 4/29 showed 30 episodes of AF with 6% burden. Patient notes that when he was in episodes would feel anxious, weak, and dizzy. No shortness of breath or chest pain with episodes.   On follow up 09/06/23, he is currently in AV paced rhythm. Admission for diuresis/volume optimization secondary to acute on chronic HF with reduced EF 11/4-8/24. Admission 11/19-21/24 for severe mitral regurgitation s/p transcatheter edge to edge mitral valve repair. He was noted to be in rapid Afib at admission and started on IV amiodarone . Discharged on amiodarone  200 mg TID then to 200 mg  BID. He has noted no episodes of Afib since hospital discharge on 11/21. He feels better since mitral valve repair, notes to be a little less SOB, and has a little bit more energy. He is currently taking amiodarone  200 mg BID. No missed doses of Xarelto  15 mg daily. He shows me a log of daily weights and for December has been in between 204.4 and 205.6 lbs (today 205 lbs).   On follow up 10/23/23, he is currently in Afib. Patient contacted office today noting he has not felt well the past 3 days; device transmission showed Afib with controlled VR. He notes to feel weak and dizzy at times when out of rhythm. No missed doses of Xarelto .   On follow up 11/12/23, he is currently in AV paced rhythm. He was seen on 1/22 noted to be in Afib and amiodarone  was increased to 200 mg BID. He was scheduled for cardioversion on 1/27 and noted to be in AV paced rhythm so procedure was canceled. He is currently taking amiodarone  200 mg BID. He feels better in normal rhythm. No missed doses of Xarelto  15 mg daily.   On follow up 12/11/23, he is currently in V paced rhythm. He contacted office today noting he felt dizzy and likely out of rhythm. Device clinic notes 100% Afib burden since 3/11. He is on amiodarone  200 mg daily and Xarelto  15 mg daily. He is dizzy and feels SOB at times; it is noted when he is out of rhythm he  becomes hypotensive.   On follow up 03/12/24, patient is currently in V paced rhythm. S/p attempted Afib ablation on 02/13/24 by Dr. Lawana Pray. Per ablation report, Three-dimensional electroanatomical mapping was performed using CARTO technology.  This demonstrated electrical isolation of the posterior wall and the four pulmonary veins..  Entrance and exit block were confirmed. Due to isolation, no ablation was performed. Patient was cardioverted after procedure. He is taking amiodarone  200 mg daily. No chest pain or SOB. Leg sites healed without issue. No missed doses of Xarelto  15 mg daily.   Today, he  denies symptoms of orthopnea, PND, lower extremity edema, dizziness, presyncope, syncope, snoring, daytime somnolence, bleeding, or neurologic sequela. The patient is tolerating medications without difficulties and is otherwise without complaint today.     Atrial Fibrillation Risk Factors:  he does have symptoms or diagnosis of sleep apnea. he is compliant with CPAP therapy. he does not have a history of rheumatic fever.  he has a BMI of Body mass index is 33.93 kg/m.Chad Brown Filed Weights   03/12/24 1323  Weight: 95.3 kg     Family History  Problem Relation Age of Onset   Kidney disease Mother    Heart disease Mother        MI, open heart   Diabetes Mother        dialysis   Leukemia Father    Colon cancer Paternal Uncle    Lung cancer Paternal Uncle        x 2   Prostate cancer Paternal Uncle    Diabetes Maternal Grandmother    Heart attack Maternal Uncle    Diabetes Maternal Aunt        x 3   Diabetes Maternal Uncle    Thyroid  disease Sister     Atrial Fibrillation Management history:  Previous antiarrhythmic drugs: amiodarone  Previous cardioversions: several, most recently 2018 Previous ablations: 11/03/19, 02/13/24 CHADS2VASC score: 4 Anticoagulation history: Xarelto    Past Medical History:  Diagnosis Date   Adenomatous colon polyp    tubular   AICD (automatic cardioverter/defibrillator) present 03/08/2015   MDT CRTD dual pacemaker and defib   Anemia    iron  deficient   Arthritis    about all my joints; hands, knees, back (03/08/2015)   Atherosclerosis    Cataract    left eye small   Cholelithiasis    gallstones   Chronic systolic CHF (congestive heart failure) (HCC)    a. New dx 12/2012 ? NICM, may be r/t afib. b. Nuc 03/2013 - normal;  c. 03/2015 TEE EF 15-20%.   Colon polyp, hyperplastic 01/2015   removed precancerous lesions   Depression    Diverticulosis    Dysrhythmia    afib   GERD (gastroesophageal reflux disease)    Glaucoma    right eye    Hyperlipidemia    Hypertension    Melanoma of eye (HCC) 2000's   right; it's never been biopsied   Melanoma of lower leg (HCC) 2015   left; right at my knee   Myocardial infarction (HCC) 1998   OSA (obstructive sleep apnea) 01/04/2016   no longer tolerates cpap   Peripheral vision loss, right 2006   Persistent atrial fibrillation (HCC)    a. Dx 12/2012, s/p TEE/DCCV 01/26/13. b. On Xarelto  (CHA2DS2VASc = 3);  c. 03/2015 TEE (EF 15-20%, no LAA thrombus) and DCCV - amio increased to 200 mg bid.   Pneumonia    S/P mitral valve clip implantation 08/21/2023   Placement of 1 XTW in  the A2/P2 position and 1 NTW in the A1/P1 position by Dr. Lorie Rook and Dr. Arlester Ladd   Urinary hesitancy due to benign prostatic hypertrophy    Past Surgical History:  Procedure Laterality Date   ATRIAL FIBRILLATION ABLATION N/A 11/03/2019   Procedure: ATRIAL FIBRILLATION ABLATION;  Surgeon: Jolly Needle, MD;  Location: [facility] INVASIVE CV LAB;  Service: Cardiovascular;  Laterality: N/A;   ATRIAL FIBRILLATION ABLATION N/A 02/13/2024   Procedure: ATRIAL FIBRILLATION ABLATION;  Surgeon: Lei Pump, MD;  Location: [facility] INVASIVE CV LAB;  Service: Cardiovascular;  Laterality: N/A;   BACK SURGERY     upper back, cannot turn neck well   BIV ICD GENERATOR CHANGEOUT N/A 09/16/2023   Procedure: BIV ICD GENERATOR CHANGEOUT;  Surgeon: Lei Pump, MD;  Location: [facility] INVASIVE CV LAB;  Service: Cardiovascular;  Laterality: N/A;   CARDIAC CATHETERIZATION  1998   CARDIOVERSION N/A 01/26/2013   Procedure: CARDIOVERSION;  Surgeon: Lenise Quince, MD;  Location: [facility] ENDOSCOPY;  Service: Cardiovascular;  Laterality: N/A;   CARDIOVERSION N/A 03/23/2015   Procedure: CARDIOVERSION;  Surgeon: Hugh Madura, MD;  Location: [facility] ENDOSCOPY;  Service: Cardiovascular;  Laterality: N/A;   CARDIOVERSION N/A 08/14/2017   Procedure: CARDIOVERSION;  Surgeon: Loyde Rule, MD;  Location: [facility] ENDOSCOPY;  Service: Cardiovascular;  Laterality:  N/A;   CATARACT EXTRACTION Right ~ 2006   COLONOSCOPY WITH PROPOFOL  N/A 02/10/2015   Procedure: COLONOSCOPY WITH PROPOFOL ;  Surgeon: Kenney Peacemaker, MD;  Location: [facility] ENDOSCOPY;  Service: Endoscopy;  Laterality: N/A;   COLONOSCOPY WITH PROPOFOL  N/A 08/07/2016   Procedure: COLONOSCOPY WITH PROPOFOL ;  Surgeon: Kenney Peacemaker, MD;  Location: [facility] ENDOSCOPY;  Service: Endoscopy;  Laterality: N/A;   ENTEROSCOPY N/A 08/17/2015   Procedure: ENTEROSCOPY;  Surgeon: Kenney Peacemaker, MD;  Location: [facility] ENDOSCOPY;  Service: Endoscopy;  Laterality: N/A;   EP IMPLANTABLE DEVICE N/A 03/08/2015   MDT Kenna Payor CRT-D for nonischemic CM by Dr Nunzio Belch for primary prevention   GLAUCOMA SURGERY Right ~ 2006   put 3 stents in to drain fluid (03/08/2015) not successful, sent to [facility] to try to get last stent out   HOT HEMOSTASIS N/A 08/07/2016   Procedure: HOT HEMOSTASIS (ARGON PLASMA COAGULATION/BICAP);  Surgeon: Kenney Peacemaker, MD;  Location: [facility] ENDOSCOPY;  Service: Endoscopy;  Laterality: N/A;   INCISION AND DRAINAGE ABSCESS POSTERIOR CERVICALSPINE  05/2012   JOINT REPLACEMENT     MELANOMA EXCISION Left 2015   lower leg; right at my knee   REFRACTIVE SURGERY Right ~ 2006 X 2   twice; both done at [facility] (03/08/2015   RIGHT HEART CATH N/A 08/05/2023   Procedure: RIGHT HEART CATH;  Surgeon: Darlis Eisenmenger, MD;  Location: [facility] INVASIVE CV LAB;  Service: Cardiovascular;  Laterality: N/A;   SURGERY SCROTAL / TESTICULAR Right 1990's   TEE WITHOUT CARDIOVERSION N/A 01/26/2013   Procedure: TRANSESOPHAGEAL ECHOCARDIOGRAM (TEE);  Surgeon: Lenise Quince, MD;  Location: [facility] ENDOSCOPY;  Service: Cardiovascular;  Laterality: N/A;  [name] anes. /    TEE WITHOUT CARDIOVERSION N/A 10/05/2014   Procedure: TRANSESOPHAGEAL ECHOCARDIOGRAM (TEE)  with cardioversion;  Surgeon: Lake Pilgrim, MD;  Location: [facility] ENDOSCOPY;  Service: Cardiovascular;  Laterality: N/A;  12:52 synched cardioversion at 120 joules,...afib to SR...12 lead EKG  ordered.Chad AasAaron AasCardiozem d/c'ed per MD verbal order at SR   TEE WITHOUT CARDIOVERSION N/A 03/23/2015   Procedure: TRANSESOPHAGEAL ECHOCARDIOGRAM (TEE);  Surgeon: Hugh Madura, MD;  Location: [facility] ENDOSCOPY;  Service: Cardiovascular;  Laterality: N/A;   TEE  WITHOUT CARDIOVERSION N/A 11/02/2019   Procedure: TRANSESOPHAGEAL ECHOCARDIOGRAM (TEE);  Surgeon: Loyde Rule, MD;  Location: [facility] ENDOSCOPY;  Service: Cardiovascular;  Laterality: N/A;   TEE WITHOUT CARDIOVERSION N/A 08/05/2023   Procedure: TRANSESOPHAGEAL ECHOCARDIOGRAM;  Surgeon: Darlis Eisenmenger, MD;  Location: [facility] INVASIVE CV LAB;  Service: Cardiovascular;  Laterality: N/A;   THORACIC SPINE SURGERY  03/2000   ground calcium  deposits from upper thoracic (01/26/2013)   THYROIDECTOMY N/A 05/11/2021   Procedure: TOTAL THYROIDECTOMY;  Surgeon: Oralee Billow, MD;  Location: [facility] ORS;  Service: General;  Laterality: N/A;   TOTAL HIP ARTHROPLASTY Right 06/2007   TRANSCATHETER MITRAL EDGE TO EDGE REPAIR N/A 08/21/2023   Procedure: TRANSCATHETER MITRAL EDGE TO EDGE REPAIR;  Surgeon: Thukkani, Arun K, MD;  Location: [facility] INVASIVE CV LAB;  Service: Cardiovascular;  Laterality: N/A;   TRANSESOPHAGEAL ECHOCARDIOGRAM (CATH LAB) N/A 08/09/2023   Procedure: TRANSESOPHAGEAL ECHOCARDIOGRAM;  Surgeon: Darlis Eisenmenger, MD;  Location: [facility] INVASIVE CV LAB;  Service: Cardiovascular;  Laterality: N/A;   TRANSESOPHAGEAL ECHOCARDIOGRAM (CATH LAB) N/A 08/21/2023   Procedure: TRANSESOPHAGEAL ECHOCARDIOGRAM;  Surgeon: Kyra Phy, MD;  Location: [facility] INVASIVE CV LAB;  Service: Cardiovascular;  Laterality: N/A;    Current Outpatient Medications  Medication Sig Dispense Refill   acetaminophen  (TYLENOL ) 500 MG tablet Take 1,000 mg by mouth 2 (two) times daily.     amiodarone  (PACERONE ) 200 MG tablet Take 1 tablet (200 mg total) by mouth daily.     Ascorbic Acid (VITAMIN C GUMMIE PO) Take 1 tablet by mouth in the morning. (Patient taking differently: Taking 1 gummy by mouth daily)      calcium  carbonate (TUMS EX) 750 MG chewable tablet Chew 1 tablet by mouth daily.     cholecalciferol (VITAMIN D3) 25 MCG (1000 UNIT) tablet Take 1,000 Units by mouth in the morning.     dorzolamide -timolol  (COSOPT ) 22.3-6.8 MG/ML ophthalmic solution Place 1 drop into the right eye 2 (two) times daily.     empagliflozin  (JARDIANCE ) 10 MG TABS tablet TAKE ONE TABLET DAILY 30 tablet 2   escitalopram  (LEXAPRO ) 20 MG tablet Take 1 tablet (20 mg total) by mouth daily. 90 tablet 0   Evolocumab  (REPATHA  SURECLICK) 140 MG/ML SOAJ INJECT 140MG ( ) INTO SKIN EVERY 2 WEEKS 6 mL 3   Ferrous Sulfate  (IRON  PO) Take 9 mg by mouth in the morning. Gummie     finasteride  (PROSCAR ) 5 MG tablet Take 1 tablet (5 mg total) by mouth daily. 30 tablet 2   FOLIC ACID  PO Take 400 mcg by mouth in the morning.     gabapentin  (NEURONTIN ) 100 MG capsule Take 200 mg by mouth 2 (two) times daily.      latanoprost  (XALATAN ) 0.005 % ophthalmic solution Place 1 drop into the right eye daily at 12 noon.     levothyroxine  (SYNTHROID ) 137 MCG tablet TAKE ONE TABLET DAILY BEFORE BREAKFAST 90 tablet 1   Polyethyl Glycol-Propyl Glycol 0.4-0.3 % SOLN Apply 1 drop to eye 3 (three) times daily as needed (dry/irritated eyes.). Systane     potassium chloride  SA (KLOR-CON  M) 20 MEQ tablet Take 1 tablet (20 mEq total) by mouth daily. 30 tablet 6   RHOPRESSA  0.02 % SOLN Place 1 drop into the right eye at bedtime.     tamsulosin  (FLOMAX ) 0.4 MG CAPS Take 0.4 mg by mouth at bedtime.      torsemide  (DEMADEX ) 20 MG tablet Take 2 tablets (40 mg total) by mouth 2 (two) times daily.     XARELTO   15 MG TABS tablet TAKE ONE TABLET DAILY WITH SUPPER 90 tablet 1   No current facility-administered medications for this encounter.    Allergies  Allergen Reactions   Pravastatin  Sodium Other (See Comments)    Joint and muscle pain   Azithromycin  Diarrhea    ROS- All systems are reviewed and negative except as per the HPI above.  Physical  Exam: Vitals:   03/12/24 1323  Weight: 95.3 kg  Height: 5' 6 (1.676 m)    GEN- The patient is well appearing, alert and oriented x 3 today.   Neck - no JVD or carotid bruit noted Lungs- Clear to ausculation bilaterally, normal work of breathing Heart- Regular rate (V paced rhythm), no murmurs, rubs or gallops, PMI not laterally displaced Extremities- no clubbing, cyanosis, or edema Skin - no rash or ecchymosis noted   Wt Readings from Last 3 Encounters:  03/12/24 95.3 kg  02/13/24 93.9 kg  01/20/24 93.4 kg    EKG today demonstrates Vent. rate 94 BPM PR interval * ms QRS duration 162 ms QT/QTcB 474/592 ms P-R-T axes * 235 43 Ventricular-paced rhythm Biventricular pacemaker detected Abnormal ECG When compared with ECG of 11-Dec-2023 14:54, Vent. rate has decreased BY 7 BPM  Echo 02/13/23:  1. Left ventricular ejection fraction, by estimation, is 50%. The left  ventricle has mildly decreased function. The left ventricle demonstrates  global hypokinesis. Left ventricular diastolic parameters are  indeterminate.   2. Right ventricular systolic function is mildly reduced. The right  ventricular size is normal. There is moderately elevated pulmonary artery  systolic pressure. The estimated right ventricular systolic pressure is  49.5 mmHg.   3. Left atrial size was moderately dilated.   4. S/p Mitraclip placement. The mitral valve has been repaired/replaced.  Mild mitral valve regurgitation. Mild mitral stenosis. The mean mitral  valve gradient is 4.0 mmHg.   5. The aortic valve is tricuspid. There is mild calcification of the  aortic valve. Aortic valve regurgitation is trivial. No aortic stenosis is  present.   6. Aortic dilatation noted. There is mild dilatation of the ascending  aorta, measuring 40 mm.   7. The inferior vena cava is normal in size with <50% respiratory  variability, suggesting right atrial pressure of 8 mmHg.   Epic records are reviewed at length  today  CHA2DS2-VASc Score = 4  The patient's score is based upon: CHF History: 1 HTN History: 1 Diabetes History: 0 Stroke History: 0 Vascular Disease History: 0 Age Score: 2 Gender Score: 0       ASSESSMENT AND PLAN: 1. Persistent Atrial Fibrillation (ICD10:  I48.19) The patient's CHA2DS2-VASc score is 4 , indicating a  4.0% annual risk of stroke.   S/p afib ablation 11/03/19. S/p attempted afib ablation 02/13/24 by Dr. Lawana Pray. Mapping performed demonstrating electrical isolation of wall and veins. Entrance and exit block were confirmed; due to isolation no ablation was performed. Patient was cardioverted after procedure.   He is currently in V paced rhythm. ICM monitoring report 6/9 noted increase in percentage of Afib since 5/15 attempted ablation. Increase amiodarone  to 200 mg BID and will reassess burden with remote interrogation scheduled on 6/26.    High risk medication monitoring (ICD10: [id number]) Patient requires ongoing monitoring for anti-arrhythmic medication which has the potential to cause life threatening arrhythmias or AV block. Qtc stable. Increase amiodarone  to 200 mg BID for 2 weeks then resume 200 mg once daily.    2. Secondary Hypercoagulable State (ICD10:  D68.69) The  patient is at significant risk for stroke/thromboembolism based upon his CHA2DS2-VASc Score of 4 .  Continue Rivaroxaban  (Xarelto ).  No missed doses.  3. Chronic systolic CHF Euvolemic today.   Follow up with EP as scheduled.   Minnie Amber, PA-C Afib Clinic [facility] [address] [place], [place] [address] [phone] 03/12/2024 1:34 PM

## 2024-03-12 NOTE — Patient Instructions (Addendum)
 Increase Amiodarone  to 200mg  twice a day until 03/26/24 then reduce back to once daily  We will contact you after your transmission is reviewed

## 2024-03-17 ENCOUNTER — Other Ambulatory Visit (HOSPITAL_COMMUNITY)
Admission: RE | Admit: 2024-03-17 | Discharge: 2024-03-17 | Disposition: A | Discharge status: Home / Self Care | Source: Ambulatory Visit | Attending: Nephrology | Admitting: Nephrology

## 2024-03-17 DIAGNOSIS — R809 Proteinuria, unspecified: Secondary | ICD-10-CM | POA: Diagnosis not present

## 2024-03-17 DIAGNOSIS — N189 Chronic kidney disease, unspecified: Secondary | ICD-10-CM | POA: Insufficient documentation

## 2024-03-17 DIAGNOSIS — E211 Secondary hyperparathyroidism, not elsewhere classified: Secondary | ICD-10-CM | POA: Insufficient documentation

## 2024-03-17 DIAGNOSIS — D631 Anemia in chronic kidney disease: Secondary | ICD-10-CM | POA: Diagnosis not present

## 2024-03-17 LAB — CBC
HCT: 40.9 % (ref 39.0–52.0)
Hemoglobin: 12.6 g/dL — ABNORMAL LOW (ref 13.0–17.0)
MCH: 31.9 pg (ref 26.0–34.0)
MCHC: 30.8 g/dL (ref 30.0–36.0)
MCV: 103.5 fL — ABNORMAL HIGH (ref 80.0–100.0)
Platelets: 143 10*3/uL — ABNORMAL LOW (ref 150–400)
RBC: 3.95 MIL/uL — ABNORMAL LOW (ref 4.22–5.81)
RDW: 15.4 % (ref 11.5–15.5)
WBC: 5.8 10*3/uL (ref 4.0–10.5)
nRBC: 0 % (ref 0.0–0.2)

## 2024-03-17 LAB — RENAL FUNCTION PANEL
Albumin: 3.7 g/dL (ref 3.5–5.0)
Anion gap: 10 (ref 5–15)
BUN: 38 mg/dL — ABNORMAL HIGH (ref 8–23)
CO2: 29 mmol/L (ref 22–32)
Calcium: 8.7 mg/dL — ABNORMAL LOW (ref 8.9–10.3)
Chloride: 104 mmol/L (ref 98–111)
Creatinine, Ser: 2.53 mg/dL — ABNORMAL HIGH (ref 0.61–1.24)
GFR, Estimated: 25 mL/min — ABNORMAL LOW (ref >60)
Glucose, Bld: 112 mg/dL — ABNORMAL HIGH (ref 70–99)
Phosphorus: 4.1 mg/dL (ref 2.5–4.6)
Potassium: 5 mmol/L (ref 3.5–5.1)
Sodium: 143 mmol/L (ref 135–145)

## 2024-03-17 LAB — PROTEIN / CREATININE RATIO, URINE
Creatinine, Urine: 87 mg/dL
Total Protein, Urine: <6 mg/dL

## 2024-03-18 LAB — PARATHYROID HORMONE, INTACT (NO CA): PTH: 65 pg/mL (ref 15–65)

## 2024-03-23 DIAGNOSIS — N2581 Secondary hyperparathyroidism of renal origin: Secondary | ICD-10-CM | POA: Diagnosis not present

## 2024-03-23 DIAGNOSIS — N184 Chronic kidney disease, stage 4 (severe): Secondary | ICD-10-CM | POA: Diagnosis not present

## 2024-03-23 DIAGNOSIS — D638 Anemia in other chronic diseases classified elsewhere: Secondary | ICD-10-CM | POA: Diagnosis not present

## 2024-03-23 DIAGNOSIS — I5042 Chronic combined systolic (congestive) and diastolic (congestive) heart failure: Secondary | ICD-10-CM | POA: Diagnosis not present

## 2024-03-26 ENCOUNTER — Ambulatory Visit: Payer: Medicare PPO

## 2024-03-26 ENCOUNTER — Other Ambulatory Visit (HOSPITAL_COMMUNITY): Payer: Self-pay | Admitting: Cardiology

## 2024-03-26 DIAGNOSIS — I428 Other cardiomyopathies: Secondary | ICD-10-CM

## 2024-03-26 LAB — CUP PACEART REMOTE DEVICE CHECK
Battery Remaining Longevity: 104 mo
Battery Voltage: 2.97 V
Brady Statistic RV Percent Paced: 94.13 %
Date Time Interrogation Session: 20250626055702
HighPow Impedance: 65 Ohm
Lead Channel Impedance Value: 1121 Ohm
Lead Channel Impedance Value: 1140 Ohm
Lead Channel Impedance Value: 1254 Ohm
Lead Channel Impedance Value: 1406 Ohm
Lead Channel Impedance Value: 323 Ohm
Lead Channel Impedance Value: 418 Ohm
Lead Channel Impedance Value: 456 Ohm
Lead Channel Impedance Value: 475 Ohm
Lead Channel Impedance Value: 551 Ohm
Lead Channel Impedance Value: 722 Ohm
Lead Channel Impedance Value: 741 Ohm
Lead Channel Impedance Value: 874 Ohm
Lead Channel Impedance Value: 893 Ohm
Lead Channel Pacing Threshold Amplitude: 0.625 V
Lead Channel Pacing Threshold Amplitude: 0.625 V
Lead Channel Pacing Threshold Amplitude: 0.875 V
Lead Channel Pacing Threshold Pulse Width: 0.4 ms
Lead Channel Pacing Threshold Pulse Width: 0.4 ms
Lead Channel Pacing Threshold Pulse Width: 0.8 ms
Lead Channel Sensing Intrinsic Amplitude: 0.6 mV
Lead Channel Sensing Intrinsic Amplitude: 15.8 mV
Lead Channel Setting Pacing Amplitude: 1.5 V
Lead Channel Setting Pacing Amplitude: 1.5 V
Lead Channel Setting Pacing Amplitude: 2 V
Lead Channel Setting Pacing Pulse Width: 0.4 ms
Lead Channel Setting Pacing Pulse Width: 0.8 ms
Lead Channel Setting Sensing Sensitivity: 0.3 mV
Zone Setting Status: 755011
Zone Setting Status: 755011

## 2024-03-27 ENCOUNTER — Telehealth (HOSPITAL_COMMUNITY): Payer: Self-pay | Admitting: *Deleted

## 2024-03-27 NOTE — Telephone Encounter (Signed)
 Pt notified and verbalized understanding.

## 2024-03-27 NOTE — Telephone Encounter (Signed)
-----   Message from Nurse Glade BROCKS sent at 03/27/2024  1:24 PM EDT ----- Regarding: FW: Dr. Inocencio interrogated his device yesterday (remote check)  ----- Message ----- From: Terra Fairy PARAS, PA-C Sent: 03/27/2024   9:24 AM EDT To: Glade GORMAN Pepper, RN Subject: Dr. Inocencio interrogated his device yesterda#  He has like 31% burden. At our last visit together, I went ahead and had him increase his amio to 200 BID for 2 weeks.   Can you please have him do it for another 2 weeks so for a TOTAL of 4 weeks 200 mg BID then transition to 200 mg once daily?   He has an upcoming appt with Renee.   Thank you

## 2024-03-29 ENCOUNTER — Ambulatory Visit: Payer: Self-pay | Admitting: Cardiology

## 2024-04-13 ENCOUNTER — Inpatient Hospital Stay: Payer: Medicare PPO | Attending: Hematology

## 2024-04-13 DIAGNOSIS — D631 Anemia in chronic kidney disease: Secondary | ICD-10-CM

## 2024-04-13 DIAGNOSIS — D649 Anemia, unspecified: Secondary | ICD-10-CM | POA: Insufficient documentation

## 2024-04-13 DIAGNOSIS — X32XXXD Exposure to sunlight, subsequent encounter: Secondary | ICD-10-CM | POA: Diagnosis not present

## 2024-04-13 DIAGNOSIS — Z79899 Other long term (current) drug therapy: Secondary | ICD-10-CM | POA: Diagnosis not present

## 2024-04-13 DIAGNOSIS — Z7901 Long term (current) use of anticoagulants: Secondary | ICD-10-CM | POA: Diagnosis not present

## 2024-04-13 DIAGNOSIS — Z85828 Personal history of other malignant neoplasm of skin: Secondary | ICD-10-CM | POA: Insufficient documentation

## 2024-04-13 DIAGNOSIS — Z87891 Personal history of nicotine dependence: Secondary | ICD-10-CM | POA: Insufficient documentation

## 2024-04-13 DIAGNOSIS — E538 Deficiency of other specified B group vitamins: Secondary | ICD-10-CM | POA: Diagnosis not present

## 2024-04-13 DIAGNOSIS — Z08 Encounter for follow-up examination after completed treatment for malignant neoplasm: Secondary | ICD-10-CM | POA: Diagnosis not present

## 2024-04-13 DIAGNOSIS — L570 Actinic keratosis: Secondary | ICD-10-CM | POA: Diagnosis not present

## 2024-04-13 DIAGNOSIS — D225 Melanocytic nevi of trunk: Secondary | ICD-10-CM | POA: Diagnosis not present

## 2024-04-13 DIAGNOSIS — Z85820 Personal history of malignant melanoma of skin: Secondary | ICD-10-CM | POA: Diagnosis not present

## 2024-04-13 DIAGNOSIS — Z85850 Personal history of malignant neoplasm of thyroid: Secondary | ICD-10-CM | POA: Insufficient documentation

## 2024-04-13 DIAGNOSIS — D696 Thrombocytopenia, unspecified: Secondary | ICD-10-CM | POA: Diagnosis not present

## 2024-04-13 DIAGNOSIS — Z1283 Encounter for screening for malignant neoplasm of skin: Secondary | ICD-10-CM | POA: Diagnosis not present

## 2024-04-13 LAB — COMPREHENSIVE METABOLIC PANEL WITH GFR
ALT: 21 U/L (ref 0–44)
AST: 17 U/L (ref 15–41)
Albumin: 3.5 g/dL (ref 3.5–5.0)
Alkaline Phosphatase: 91 U/L (ref 38–126)
Anion gap: 9 (ref 5–15)
BUN: 42 mg/dL — ABNORMAL HIGH (ref 8–23)
CO2: 28 mmol/L (ref 22–32)
Calcium: 8.4 mg/dL — ABNORMAL LOW (ref 8.9–10.3)
Chloride: 103 mmol/L (ref 98–111)
Creatinine, Ser: 2.37 mg/dL — ABNORMAL HIGH (ref 0.61–1.24)
GFR, Estimated: 27 mL/min — ABNORMAL LOW (ref >60)
Glucose, Bld: 121 mg/dL — ABNORMAL HIGH (ref 70–99)
Potassium: 4.3 mmol/L (ref 3.5–5.1)
Sodium: 140 mmol/L (ref 135–145)
Total Bilirubin: 0.6 mg/dL (ref 0.0–1.2)
Total Protein: 7.2 g/dL (ref 6.5–8.1)

## 2024-04-13 LAB — CBC WITH DIFFERENTIAL/PLATELET
Abs Immature Granulocytes: 0.02 K/uL (ref 0.00–0.07)
Basophils Absolute: 0.1 K/uL (ref 0.0–0.1)
Basophils Relative: 1 %
Eosinophils Absolute: 0.2 K/uL (ref 0.0–0.5)
Eosinophils Relative: 2 %
HCT: 37.5 % — ABNORMAL LOW (ref 39.0–52.0)
Hemoglobin: 12 g/dL — ABNORMAL LOW (ref 13.0–17.0)
Immature Granulocytes: 0 %
Lymphocytes Relative: 12 %
Lymphs Abs: 0.8 K/uL (ref 0.7–4.0)
MCH: 32.6 pg (ref 26.0–34.0)
MCHC: 32 g/dL (ref 30.0–36.0)
MCV: 101.9 fL — ABNORMAL HIGH (ref 80.0–100.0)
Monocytes Absolute: 0.5 K/uL (ref 0.1–1.0)
Monocytes Relative: 8 %
Neutro Abs: 5.1 K/uL (ref 1.7–7.7)
Neutrophils Relative %: 77 %
Platelets: 150 K/uL (ref 150–400)
RBC: 3.68 MIL/uL — ABNORMAL LOW (ref 4.22–5.81)
RDW: 15.1 % (ref 11.5–15.5)
WBC: 6.7 K/uL (ref 4.0–10.5)
nRBC: 0 % (ref 0.0–0.2)

## 2024-04-13 LAB — IRON AND TIBC
Iron: 87 ug/dL (ref 45–182)
Saturation Ratios: 29 % (ref 17.9–39.5)
TIBC: 302 ug/dL (ref 250–450)
UIBC: 215 ug/dL

## 2024-04-13 LAB — FERRITIN: Ferritin: 95 ng/mL (ref 24–336)

## 2024-04-13 LAB — VITAMIN B12: Vitamin B-12: 341 pg/mL (ref 180–914)

## 2024-04-13 LAB — FOLATE: Folate: 17.4 ng/mL (ref >5.9)

## 2024-04-15 LAB — METHYLMALONIC ACID, SERUM: Methylmalonic Acid, Quantitative: 439 nmol/L — ABNORMAL HIGH (ref 0–378)

## 2024-04-20 ENCOUNTER — Inpatient Hospital Stay (HOSPITAL_BASED_OUTPATIENT_CLINIC_OR_DEPARTMENT_OTHER): Admitting: Oncology

## 2024-04-20 ENCOUNTER — Inpatient Hospital Stay: Payer: Medicare PPO | Admitting: Physician Assistant

## 2024-04-20 VITALS — BP 98/74 | HR 72 | Temp 96.3°F | Resp 20 | Wt 209.7 lb

## 2024-04-20 DIAGNOSIS — D649 Anemia, unspecified: Secondary | ICD-10-CM | POA: Diagnosis not present

## 2024-04-20 DIAGNOSIS — C4490 Unspecified malignant neoplasm of skin, unspecified: Secondary | ICD-10-CM | POA: Diagnosis not present

## 2024-04-20 DIAGNOSIS — Z85828 Personal history of other malignant neoplasm of skin: Secondary | ICD-10-CM | POA: Diagnosis not present

## 2024-04-20 DIAGNOSIS — D696 Thrombocytopenia, unspecified: Secondary | ICD-10-CM | POA: Diagnosis not present

## 2024-04-20 DIAGNOSIS — Z79899 Other long term (current) drug therapy: Secondary | ICD-10-CM | POA: Diagnosis not present

## 2024-04-20 DIAGNOSIS — Z85850 Personal history of malignant neoplasm of thyroid: Secondary | ICD-10-CM | POA: Diagnosis not present

## 2024-04-20 DIAGNOSIS — R911 Solitary pulmonary nodule: Secondary | ICD-10-CM

## 2024-04-20 DIAGNOSIS — C73 Malignant neoplasm of thyroid gland: Secondary | ICD-10-CM | POA: Diagnosis not present

## 2024-04-20 DIAGNOSIS — E538 Deficiency of other specified B group vitamins: Secondary | ICD-10-CM | POA: Diagnosis not present

## 2024-04-20 DIAGNOSIS — Z87891 Personal history of nicotine dependence: Secondary | ICD-10-CM | POA: Diagnosis not present

## 2024-04-20 DIAGNOSIS — Z7901 Long term (current) use of anticoagulants: Secondary | ICD-10-CM | POA: Diagnosis not present

## 2024-04-20 NOTE — Progress Notes (Signed)
 [facility] at [facility]  HEMATOLOGY FOLLOW-UP VISIT  [name], MD  REASON FOR FOLLOW-UP: Normocytic anemia and thrombocytopenia  ASSESSMENT & PLAN:  Patient is a 83 y.o. male following for normocytic anemia and thrombocytopenia  Assessment & Plan Normocytic anemia Patient has mild normocytic anemia likely secondary to anemia of chronic kidney disease Mild iron  deficiency and vitamin B12 deficiency Colonoscopy (08/07/2016): Polyps x 3 (tubular adenomas), diverticulosis, internal hemorrhoids Small bowel enteroscopy (08/17/2015): Esophageal polyp, gastritis, scattered xanthomas and small bowel otherwise normal enteroscopy with no cause of blood loss/anemia seen  - Continue taking daily iron  and vitamin B12 gummy.  Increase vitamin B12 to 1000 mcg. - No indication for ESA shots at this time - Will replace iron  with IV iron  if TSAT is less than 25-30 - Continue to follow with Dr. Rachele for CKD  Return to clinic in 6 months with labs Thrombocytopenia [facility]) Likely secondary to nutritional deficiency. Resolved at this time Skin cancer T2 N0 melanoma of left thigh  - Status post resection in 2015 at [facility].  Papillary thyroid  carcinoma (HCC) Thyroid  surgery by Dr. Eletha (total thyroidectomy 05/11/2021) He had radioactive iodine  pill in January 2023 under the direction of Dr. Kassie and Dr. Malva  - Continue to follow with endocrinologist, Dr. Lenis.  Lung nodule He is a former smoker, smoked more than 2 packs/day for many years and quit in 2001. Follows with [facility] for annual CT chest, most recently completed on 09/04/2023.  Reviewed today, showing stable nodule.  - Continue to follow with pulmonary medicine Vitamin B12 deficiency Patient has mild vitamin B12 deficiency with levels less than 400  - Increase oral vitamin B12 to 1000 mcg daily     Orders Placed This Encounter  Procedures   CBC with Differential/Platelet     Standing Status:   Future    Expected Date:   10/19/2024    Expiration Date:   01/17/2025   Comprehensive metabolic panel with GFR    Standing Status:   Future    Expected Date:   10/19/2024    Expiration Date:   01/17/2025   Ferritin    Standing Status:   Future    Expected Date:   10/19/2024    Expiration Date:   01/17/2025   Folate    Standing Status:   Future    Expected Date:   10/19/2024    Expiration Date:   01/17/2025   Vitamin B12    Standing Status:   Future    Expected Date:   10/19/2024    Expiration Date:   01/17/2025   Iron  and TIBC    Standing Status:   Future    Expected Date:   10/19/2024    Expiration Date:   01/17/2025    The total time spent in the appointment was 20 minutes encounter with patients including review of chart and various tests results, discussions about plan of care and coordination of care plan   All questions were answered. The patient knows to call the clinic with any problems, questions or concerns. No barriers to learning was detected.  Mickiel Dry, MD 7/21/202510:40 AM   INTERVAL HISTORY: CYREE CHUONG 83 y.o. male following for normocytic anemia and thrombocytopenia. Patient was accompanied by his wife today.He experiences dizziness and a sensation of being 'wobbly' and 'swimmy headed', particularly when walking long distances. He associates these symptoms with low blood pressure. He has not eaten today, preferring to wait until after  the doctor's visit.  He is currently taking a vitamin B12 gummy, which he believes contains 1000 mcg, due to low vitamin B12 levels. He also takes Xarelto  for atrial fibrillation.  He does not have any other complaints today.  When offered IV fluids for dizziness with low blood pressure, patient reported he will drink water and does not need any IV fluids.   I have reviewed the past medical history, past surgical history, social history and family history with the patient   ALLERGIES:  is allergic to  pravastatin  sodium and azithromycin .  MEDICATIONS:  Current Outpatient Medications  Medication Sig Dispense Refill   amiodarone  (PACERONE ) 200 MG tablet Take 1 tablet (200 mg total) by mouth daily. (Patient taking differently: Take 200 mg by mouth 2 (two) times daily.)     acetaminophen  (TYLENOL ) 500 MG tablet Take 1,000 mg by mouth 2 (two) times daily.     Ascorbic Acid (VITAMIN C GUMMIE PO) Take 1 tablet by mouth in the morning. (Patient taking differently: Taking 1 gummy by mouth daily)     calcium  carbonate (TUMS EX) 750 MG chewable tablet Chew 1 tablet by mouth daily.     cholecalciferol (VITAMIN D3) 25 MCG (1000 UNIT) tablet Take 1,000 Units by mouth in the morning.     dorzolamide -timolol  (COSOPT ) 22.3-6.8 MG/ML ophthalmic solution Place 1 drop into the right eye 2 (two) times daily.     empagliflozin  (JARDIANCE ) 10 MG TABS tablet TAKE ONE TABLET DAILY 90 tablet 0   escitalopram  (LEXAPRO ) 20 MG tablet Take 1 tablet (20 mg total) by mouth daily. 90 tablet 0   Evolocumab  (REPATHA  SURECLICK) 140 MG/ML SOAJ INJECT 140MG ( ) INTO SKIN EVERY 2 WEEKS 6 mL 3   Ferrous Sulfate  (IRON  PO) Take 9 mg by mouth in the morning. Gummie     finasteride  (PROSCAR ) 5 MG tablet Take 1 tablet (5 mg total) by mouth daily. 30 tablet 2   FOLIC ACID  PO Take 400 mcg by mouth in the morning.     gabapentin  (NEURONTIN ) 100 MG capsule Take 200 mg by mouth 2 (two) times daily.      latanoprost  (XALATAN ) 0.005 % ophthalmic solution Place 1 drop into the right eye daily at 12 noon.     levothyroxine  (SYNTHROID ) 137 MCG tablet TAKE ONE TABLET DAILY BEFORE BREAKFAST 90 tablet 1   Polyethyl Glycol-Propyl Glycol 0.4-0.3 % SOLN Apply 1 drop to eye 3 (three) times daily as needed (dry/irritated eyes.). Systane     potassium chloride  SA (KLOR-CON  M) 20 MEQ tablet Take 1 tablet (20 mEq total) by mouth daily. 30 tablet 6   RHOPRESSA  0.02 % SOLN Place 1 drop into the right eye at bedtime.     tamsulosin  (FLOMAX ) 0.4 MG CAPS Take  0.4 mg by mouth at bedtime.      torsemide  (DEMADEX ) 20 MG tablet Take 2 tablets (40 mg total) by mouth 2 (two) times daily.     XARELTO  15 MG TABS tablet TAKE ONE TABLET DAILY WITH SUPPER 90 tablet 1   No current facility-administered medications for this visit.     REVIEW OF SYSTEMS:   Constitutional: Denies fevers, chills or night sweats Eyes: Denies blurriness of vision Ears, nose, mouth, throat, and face: Denies mucositis or sore throat Respiratory: Denies cough, dyspnea or wheezes Cardiovascular: Denies palpitation, chest discomfort or lower extremity swelling Gastrointestinal:  Denies nausea, heartburn or change in bowel habits Skin: Denies abnormal skin rashes Lymphatics: Denies new lymphadenopathy or easy bruising Neurological:Denies numbness, tingling or  new weaknesses Behavioral/Psych: Mood is stable, no new changes  All other systems were reviewed with the patient and are negative.  PHYSICAL EXAMINATION:   Vitals:   04/20/24 0832  BP: 98/74  Pulse: 72  Resp: 20  Temp: (!) 96.3 F (35.7 C)  SpO2: 98%    GENERAL:alert, no distress and comfortable SKIN: skin color, texture, turgor are normal, no rashes or significant lesions LUNGS: clear to auscultation and percussion with normal breathing effort HEART: regular rate & rhythm and no murmurs and no lower extremity edema ABDOMEN:abdomen soft, non-tender and normal bowel sounds Musculoskeletal:no cyanosis of digits and no clubbing  NEURO: alert & oriented x 3 with fluent speech  LABORATORY DATA:  I have reviewed the data as listed  Lab Results  Component Value Date   WBC 6.7 04/13/2024   NEUTROABS 5.1 04/13/2024   HGB 12.0 (L) 04/13/2024   HCT 37.5 (L) 04/13/2024   MCV 101.9 (H) 04/13/2024   PLT 150 04/13/2024      Chemistry      Component Value Date/Time   NA 140 04/13/2024 0948   NA 146 (H) 01/20/2024 1013   K 4.3 04/13/2024 0948   CL 103 04/13/2024 0948   CO2 28 04/13/2024 0948   BUN 42 (H)  04/13/2024 0948   BUN 50 (H) 01/20/2024 1013   CREATININE 2.37 (H) 04/13/2024 0948      Component Value Date/Time   CALCIUM  8.4 (L) 04/13/2024 0948   ALKPHOS 91 04/13/2024 0948   AST 17 04/13/2024 0948   ALT 21 04/13/2024 0948   BILITOT 0.6 04/13/2024 0948   BILITOT 0.3 01/20/2024 1013      Latest Reference Range & Units 04/13/24 09:48  Iron  45 - 182 ug/dL 87  UIBC ug/dL 784  TIBC 749 - 549 ug/dL 697  Saturation Ratios 17.9 - 39.5 % 29  Ferritin 24 - 336 ng/mL 95  Folate >5.9 ng/mL 17.4  Methylmalonic Acid, Quantitative 0 - 378 nmol/L 439 (H)  Vitamin B12 180 - 914 pg/mL 341  (H): Data is abnormally high   RADIOGRAPHIC STUDIES: I have personally reviewed the radiological images as listed and agreed with the findings in the report.  CLINICAL DATA:  Follow-up pulmonary nodules   EXAM: CT CHEST WITHOUT CONTRAST   TECHNIQUE: Multidetector CT imaging of the chest was performed following the standard protocol without IV contrast.   RADIATION DOSE REDUCTION: This exam was performed according to the departmental dose-optimization program which includes automated exposure control, adjustment of the mA and/or kV according to patient size and/or use of iterative reconstruction technique.   COMPARISON:  07/02/2023 and 06/11/2022   FINDINGS: Cardiovascular: Mild cardiomegaly. No pericardial effusion. Left subclavian ICD.   No evidence of thoracic aortic aneurysm. Atherosclerotic calcifications of the aortic arch.   Mild three-vessel coronary atherosclerosis.   Mediastinum/Nodes: Small mediastinal lymph nodes, including a dominant 14 mm short axis low right paratracheal node with preservation of the normal fatty hilum, likely benign/reactive.   Status post thyroidectomy.   Lungs/Pleura: Multifocal patchy/ground-glass opacities in the right upper lobe (series 4/image 63), improved from the prior, suggesting post infectious/inflammatory scarring.   Additional mild  patchy/ground-glass opacity in the bilateral lower lobes (series 4/image 32), also improved, favoring post infectious/inflammatory scarring.   Small subpleural nodules in the bilateral lower lobes, measuring up to 5 mm (series 4/image 106), unchanged and likely benign. Given greater than 1 year stability, no follow-up is recommended per Fleischner Society guidelines.   No pleural effusion or pneumothorax.  Upper Abdomen: Visualized upper abdomen is notable for cholelithiasis and vascular calcifications.   Musculoskeletal: Degenerative changes of the visualized thoracolumbar spine. Multilevel laminectomy involving the lower thoracic spine.   IMPRESSION: Improving multifocal patchy opacities in the lungs bilaterally, favoring post infectious inflammatory scarring.   Stable subpleural nodularity measuring up to 5 mm, benign. No follow-up is recommended per Fleischner Society guidelines.   Aortic Atherosclerosis (ICD10-I70.0).   Electronically Signed: By: Pinkie Pebbles M.D. On: 09/13/2023 15:19

## 2024-04-20 NOTE — Patient Instructions (Signed)
 VISIT SUMMARY:  During your visit, we discussed your symptoms of dizziness and low blood pressure, as well as your ongoing management of anemia, vitamin B12 deficiency, and atrial fibrillation. We reviewed your current medications and made some recommendations to help manage your symptoms and overall health.  YOUR PLAN:  -DIZZINESS AND HYPOTENSION: Your dizziness and low blood pressure are likely due to dehydration or not eating enough. We recommend that you drink more fluids and make sure to eat regular meals to help manage these symptoms.  -ANEMIA: Your hemoglobin level is slightly below normal, which is common as people age. Continue taking your iron  supplements, and we will monitor your iron  levels. If your iron  levels drop below 25, we may consider IV iron  treatment.  -VITAMIN B12 DEFICIENCY: Your vitamin B12 levels are still low despite taking supplements. Please continue taking your Vitamin B12 supplement as directed.  -ATRIAL FIBRILLATION: Atrial fibrillation is an irregular heartbeat that can lead to blood clots. Continue taking Xarelto  as prescribed to manage this condition.  INSTRUCTIONS:  Please follow up with us  if your symptoms persist or worsen. Make sure to stay hydrated and eat regular meals. Continue with your current medications and supplements as directed. We will monitor your iron  levels and adjust treatment if necessary.

## 2024-04-20 NOTE — Assessment & Plan Note (Addendum)
 He is a former smoker, smoked more than 2 packs/day for many years and quit in 2001. Follows with [facility] for annual CT chest, most recently completed on 09/04/2023.  Reviewed today, showing stable nodule.  - Continue to follow with pulmonary medicine

## 2024-04-20 NOTE — Progress Notes (Deleted)
  [facility] at [facility]  HEMATOLOGY FOLLOW-UP VISIT  Chad Brown., MD  REASON FOR FOLLOW-UP: ***  ASSESSMENT & PLAN:  Patient is a 83 y.o. male following for ***  Assessment & Plan     No orders of the defined types were placed in this encounter.   The total time spent in the appointment was {CHL ONC TIME VISIT - DTPQU:[id number]} encounter with patients including review of chart and various tests results, discussions about plan of care and coordination of care plan   All questions were answered. The patient knows to call the clinic with any problems, questions or concerns. No barriers to learning was detected.  Mickiel Dry, MD 7/21/20258:49 AM   SUMMARY OF HEMATOLOGIC HISTORY: ***   INTERVAL HISTORY: Chad Brown 83 y.o. male following for ***. Patient was accompanied by    I have reviewed the past medical history, past surgical history, social history and family history with the patient   ALLERGIES:  is allergic to pravastatin  sodium and azithromycin .  MEDICATIONS:  Current Outpatient Medications  Medication Sig Dispense Refill   amiodarone  (PACERONE ) 200 MG tablet Take 1 tablet (200 mg total) by mouth daily. (Patient taking differently: Take 200 mg by mouth 2 (two) times daily.)     acetaminophen  (TYLENOL ) 500 MG tablet Take 1,000 mg by mouth 2 (two) times daily.     Ascorbic Acid (VITAMIN C GUMMIE PO) Take 1 tablet by mouth in the morning. (Patient taking differently: Taking 1 gummy by mouth daily)     calcium  carbonate (TUMS EX) 750 MG chewable tablet Chew 1 tablet by mouth daily.     cholecalciferol (VITAMIN D3) 25 MCG (1000 UNIT) tablet Take 1,000 Units by mouth in the morning.     dorzolamide -timolol  (COSOPT ) 22.3-6.8 MG/ML ophthalmic solution Place 1 drop into the right eye 2 (two) times daily.     empagliflozin  (JARDIANCE ) 10 MG TABS tablet TAKE ONE TABLET DAILY 90 tablet 0   escitalopram  (LEXAPRO ) 20 MG tablet Take 1  tablet (20 mg total) by mouth daily. 90 tablet 0   Evolocumab  (REPATHA  SURECLICK) 140 MG/ML SOAJ INJECT 140MG ( ) INTO SKIN EVERY 2 WEEKS 6 mL 3   Ferrous Sulfate  (IRON  PO) Take 9 mg by mouth in the morning. Gummie     finasteride  (PROSCAR ) 5 MG tablet Take 1 tablet (5 mg total) by mouth daily. 30 tablet 2   FOLIC ACID  PO Take 400 mcg by mouth in the morning.     gabapentin  (NEURONTIN ) 100 MG capsule Take 200 mg by mouth 2 (two) times daily.      latanoprost  (XALATAN ) 0.005 % ophthalmic solution Place 1 drop into the right eye daily at 12 noon.     levothyroxine  (SYNTHROID ) 137 MCG tablet TAKE ONE TABLET DAILY BEFORE BREAKFAST 90 tablet 1   Polyethyl Glycol-Propyl Glycol 0.4-0.3 % SOLN Apply 1 drop to eye 3 (three) times daily as needed (dry/irritated eyes.). Systane     potassium chloride  SA (KLOR-CON  M) 20 MEQ tablet Take 1 tablet (20 mEq total) by mouth daily. 30 tablet 6   RHOPRESSA  0.02 % SOLN Place 1 drop into the right eye at bedtime.     tamsulosin  (FLOMAX ) 0.4 MG CAPS Take 0.4 mg by mouth at bedtime.      torsemide  (DEMADEX ) 20 MG tablet Take 2 tablets (40 mg total) by mouth 2 (two) times daily.     XARELTO  15 MG TABS tablet TAKE ONE TABLET DAILY WITH SUPPER 90  tablet 1   No current facility-administered medications for this visit.     REVIEW OF SYSTEMS:   Constitutional: Denies fevers, chills or night sweats Eyes: Denies blurriness of vision Ears, nose, mouth, throat, and face: Denies mucositis or sore throat Respiratory: Denies cough, dyspnea or wheezes Cardiovascular: Denies palpitation, chest discomfort or lower extremity swelling Gastrointestinal:  Denies nausea, heartburn or change in bowel habits Skin: Denies abnormal skin rashes Lymphatics: Denies new lymphadenopathy or easy bruising Neurological:Denies numbness, tingling or new weaknesses Behavioral/Psych: Mood is stable, no new changes  All other systems were reviewed with the patient and are negative.  PHYSICAL  EXAMINATION:   Vitals:   04/20/24 0832  BP: 98/74  Pulse: 72  Resp: 20  Temp: (!) 96.3 F (35.7 C)  SpO2: 98%    GENERAL:alert, no distress and comfortable SKIN: skin color, texture, turgor are normal, no rashes or significant lesions LYMPH:  no palpable lymphadenopathy in the cervical, axillary or inguinal LUNGS: clear to auscultation and percussion with normal breathing effort HEART: regular rate & rhythm and no murmurs and no lower extremity edema ABDOMEN:abdomen soft, non-tender and normal bowel sounds Musculoskeletal:no cyanosis of digits and no clubbing  NEURO: alert & oriented x 3 with fluent speech  LABORATORY DATA:  I have reviewed the data as listed  Lab Results  Component Value Date   WBC 6.7 04/13/2024   NEUTROABS 5.1 04/13/2024   HGB 12.0 (L) 04/13/2024   HCT 37.5 (L) 04/13/2024   MCV 101.9 (H) 04/13/2024   PLT 150 04/13/2024     No results found for: SPEP, UPEP     Chemistry      Component Value Date/Time   NA 140 04/13/2024 0948   NA 146 (H) 01/20/2024 1013   K 4.3 04/13/2024 0948   CL 103 04/13/2024 0948   CO2 28 04/13/2024 0948   BUN 42 (H) 04/13/2024 0948   BUN 50 (H) 01/20/2024 1013   CREATININE 2.37 (H) 04/13/2024 0948      Component Value Date/Time   CALCIUM  8.4 (L) 04/13/2024 0948   ALKPHOS 91 04/13/2024 0948   AST 17 04/13/2024 0948   ALT 21 04/13/2024 0948   BILITOT 0.6 04/13/2024 0948   BILITOT 0.3 01/20/2024 1013        RADIOGRAPHIC STUDIES: I have personally reviewed the radiological images as listed and agreed with the findings in the report.  CUP PACEART REMOTE DEVICE CHECK Scheduled remote reviewed. Normal device function.  Presenting rhythm: AF/BiVP  Abnormal heart failure diagnostics this monitoring period, heart failure following  Known PAF, will monitor for persistence, 5 of 38 atrial ATP episodes successful, 01-1714 bursts,  AF burden 31.5%, Xarelto  per [facility] EMR Next remote 91 days.  ML, CVRS

## 2024-04-20 NOTE — Assessment & Plan Note (Addendum)
 Likely secondary to nutritional deficiency. Resolved at this time

## 2024-04-20 NOTE — Assessment & Plan Note (Addendum)
 Patient has mild normocytic anemia likely secondary to anemia of chronic kidney disease Mild iron  deficiency and vitamin B12 deficiency Colonoscopy (08/07/2016): Polyps x 3 (tubular adenomas), diverticulosis, internal hemorrhoids Small bowel enteroscopy (08/17/2015): Esophageal polyp, gastritis, scattered xanthomas and small bowel otherwise normal enteroscopy with no cause of blood loss/anemia seen  - Continue taking daily iron  and vitamin B12 gummy.  Increase vitamin B12 to 1000 mcg. - No indication for ESA shots at this time - Will replace iron  with IV iron  if TSAT is less than 25-30 - Continue to follow with Dr. Rachele for CKD  Return to clinic in 6 months with labs

## 2024-04-20 NOTE — Assessment & Plan Note (Addendum)
 T2 N0 melanoma of left thigh  - Status post resection in 2015 at [facility].

## 2024-04-20 NOTE — Assessment & Plan Note (Addendum)
 Patient has mild vitamin B12 deficiency with levels less than 400  - Increase oral vitamin B12 to 1000 mcg daily

## 2024-04-20 NOTE — Assessment & Plan Note (Addendum)
 Thyroid  surgery by Dr. Eletha (total thyroidectomy 05/11/2021) He had radioactive iodine  pill in January 2023 under the direction of Dr. Kassie and Dr. Malva  - Continue to follow with endocrinologist, Dr. Lenis.

## 2024-04-23 ENCOUNTER — Telehealth (HOSPITAL_COMMUNITY): Payer: Self-pay

## 2024-04-23 ENCOUNTER — Encounter (HOSPITAL_COMMUNITY): Payer: Self-pay | Admitting: Cardiology

## 2024-04-23 ENCOUNTER — Ambulatory Visit (HOSPITAL_COMMUNITY)
Admission: RE | Admit: 2024-04-23 | Discharge: 2024-04-23 | Disposition: A | Discharge status: Home / Self Care | Source: Ambulatory Visit | Attending: Cardiology | Admitting: Cardiology

## 2024-04-23 ENCOUNTER — Ambulatory Visit (HOSPITAL_COMMUNITY): Payer: Self-pay | Admitting: Cardiology

## 2024-04-23 VITALS — BP 110/74 | HR 86 | Ht 66.0 in | Wt 210.6 lb

## 2024-04-23 DIAGNOSIS — Z95810 Presence of automatic (implantable) cardiac defibrillator: Secondary | ICD-10-CM | POA: Insufficient documentation

## 2024-04-23 DIAGNOSIS — N183 Chronic kidney disease, stage 3 unspecified: Secondary | ICD-10-CM | POA: Diagnosis not present

## 2024-04-23 DIAGNOSIS — Z79899 Other long term (current) drug therapy: Secondary | ICD-10-CM | POA: Diagnosis not present

## 2024-04-23 DIAGNOSIS — Z7984 Long term (current) use of oral hypoglycemic drugs: Secondary | ICD-10-CM | POA: Diagnosis not present

## 2024-04-23 DIAGNOSIS — I739 Peripheral vascular disease, unspecified: Secondary | ICD-10-CM | POA: Diagnosis not present

## 2024-04-23 DIAGNOSIS — I5022 Chronic systolic (congestive) heart failure: Secondary | ICD-10-CM | POA: Insufficient documentation

## 2024-04-23 DIAGNOSIS — G4733 Obstructive sleep apnea (adult) (pediatric): Secondary | ICD-10-CM | POA: Insufficient documentation

## 2024-04-23 DIAGNOSIS — E785 Hyperlipidemia, unspecified: Secondary | ICD-10-CM | POA: Insufficient documentation

## 2024-04-23 DIAGNOSIS — I2510 Atherosclerotic heart disease of native coronary artery without angina pectoris: Secondary | ICD-10-CM | POA: Insufficient documentation

## 2024-04-23 DIAGNOSIS — I4819 Other persistent atrial fibrillation: Secondary | ICD-10-CM | POA: Diagnosis not present

## 2024-04-23 DIAGNOSIS — Z952 Presence of prosthetic heart valve: Secondary | ICD-10-CM | POA: Insufficient documentation

## 2024-04-23 DIAGNOSIS — Z7901 Long term (current) use of anticoagulants: Secondary | ICD-10-CM | POA: Insufficient documentation

## 2024-04-23 LAB — LIPID PANEL
Cholesterol: 112 mg/dL (ref 0–200)
HDL: 30 mg/dL — ABNORMAL LOW (ref >40)
LDL Cholesterol: 57 mg/dL (ref 0–99)
Total CHOL/HDL Ratio: 3.7 ratio
Triglycerides: 125 mg/dL (ref <150)
VLDL: 25 mg/dL (ref 0–40)

## 2024-04-23 LAB — TSH: TSH: 1.504 u[IU]/mL (ref 0.350–4.500)

## 2024-04-23 MED ORDER — AMIODARONE HCL 200 MG PO TABS: 200.0000 mg | ORAL_TABLET | Freq: Every day | ORAL | Status: AC

## 2024-04-23 MED ORDER — TORSEMIDE 20 MG PO TABS: ORAL_TABLET | ORAL | 3 refills | Status: AC

## 2024-04-23 MED ORDER — TORSEMIDE 20 MG PO TABS: ORAL_TABLET | ORAL | 3 refills | Status: DC

## 2024-04-23 MED ORDER — METOPROLOL SUCCINATE ER 25 MG PO TB24: 12.5000 mg | ORAL_TABLET | Freq: Every day | ORAL | 3 refills | Status: AC

## 2024-04-23 NOTE — Telephone Encounter (Signed)
 [facility] back about torsemide  dosing. Aware of order for 60 mg in the am and 40 mg in the evening. They will update prescription.

## 2024-04-23 NOTE — Progress Notes (Signed)
 ADVANCED HF CLINIC NOTE   Patient ID: Chad Brown, male   DOB: Nov 15, 1940, 83 y.o.   MRN: [id number] PCP: [name], MD HF Cardiologist: Rolan  Chief complaint: Heart failure follow up  Chad Brown is an 83 y.o. with paroxysmal atrial fibrillation and chronic systolic CHF thought to be due to nonischemic cardiomyopathy presents for followup of CHF. He has a cardiac history dating back to 1998, when he was admitted with chest pain and had cardiac cath showing nonobstructive CAD. In 4/14, he was found to be in atrial fibrillation.  Echo showed EF 30-35%.  He had DCCV back to NSR.  He was back in atrial fibrillation in 1/16, and EF was low again on TEE at that time.   Again, he had DCCV.  In 5/16, cardiac MRI showed persistently low EF, so given LBBB, he had Medtronic CRT-D device placed.  He was back in atrial fibrillation in 6/16 and had TEE-guided DCCV again with EF now 15-20% on TEE.  Repeat echo in 2/17 showed EF 55-60%. Echo in 1/19 showed EF 45-50%, mild LV and RV dilation.   Echo 1/20 showed EF 45-50%, diffuse hypokinesis.   Atrial fibrillation ablation was done in 2/21.  Echo 2/21 showed EF 40-45%, PASP 50 mmHg, mild MR. Echo in 6/22 also showed EF 40-45%.   Patient was admitted in 1/24 after a syncopal episode.  He had gone to the gym and exercised and was standing talking to someone when he got lightheaded and passed out.  No arrhythmia on device interrogation.  He was hypotensive and orthostatic in the ER. Coreg  and Entresto  were stopped and Lasix  was decreased to 20 mg bid.    Spironolactone  was stopped due to hyperkalemia.   Echo 10/24 EF 40-45%, mild LV dilation, mildly decreased RV systolic function, moderate-severe MR with splay artifact and PISA ERO 0.44 cm^2, PASP 71 mmHg, dilated IVC.    Admitted 11/4-11/8/24 for scheduled RHC/TEE, he was admitted for severe MR in the setting of low output and volume overload requiring Mirinone and lasic gtt. Seen by  [facility] with plans to optimize and perform outpatient mTEER. Discharged on Torsemide  60 mg daily.  Now s/p mTEER with clips x 2 08/21/23 with Dr. Wendel. He was discharged home on Xarelto  and Amio increased to 200 bid for 1 wk for AF and resumed on Torsemide  60 mg daily.   Atrial fibrillation ablation attempted in 5/25 but mapping showed electrical isolation of the walls and veins from prior ablation, no new ablation was done. Since that time, patient has had frequent atrial fibrillation. EP increased amiodarone  to 200 mg bid.   Today he returns for AHF follow up. Weight is up 6 lbs.  He is in atrial fibrillation today.  Looking at device interrogation, he seems to be in atrial fibrillation most of the time now despite amiodarone  200 mg bid.  He is dyspneic when he takes longer walks.  No problems around the house.  Fatigues easily in general. No orthopnea/PND.  No chest pain. SBP generally 90s-100s at home when he checks.  He has episodes of lightheadedness with standing.   Medtronic device interrogation today: Stable thoracic impedance, 90% effective BiV pacing, has been in atrial fibrillation for a number of days (appears to be generally in atrial fibrillation for the last few months.    ECG (personally reviewed from 09/06/23): atrial fibrillation with BiV pacing   Labs (7/25): K 4.3, creatinine 2.37, hgb 12, LFTs normal  PMH: 1. Atrial fibrillation: Paroxysmal.  Diagnosed 4/14, DCCV 4/14 to NSR.  DCCV 1/16 to NSR.  DCCV 6/16 to NSR.  - Ablation 2/21 - Attempted ablation 5/25 but mapping showed electrical isolation of the walls and veins from prior ablation, no new ablation was done. 2. Chronic systolic CHF: Nonischemic cardiomyopathy.  LHC in 1998 with nonobstructive disease.  Cardiolite  in 6/14 with no ischemia or infarction.  cMRI (5/16) with EF 34%, mildly dilated LV with diffuse HK worse in anterolateral wall, small punctate areas of LGE in anteroseptum and basal inferior  wall (not CAD pattern).  TEE (6/16) with EF 15-20%.  He has Medtronic CRT-D device.  Echo (9/16) with EF 40%, moderate LV dilation, grade II diastolic dysfunction, normal RV size and systolic function, PASP 42 mmHg.  - Hypotension with Entresto .  - Echo (2/17) showed LV functional recovery, EF 55-60% with mild MR.    - Echo (1/19): EF 45-50%, mild LV dilation, mild RV dilation - Echo (1/20): EF 45-50%, diffuse hypokinesis, moderate diastolic dysfunction, normal RV size and systolic function.  - Echo (2/21): EF 40-45%, PASP 50 mmHg, mild MR.  - Echo (6/22): EF 40-45%, moderate LV dilation, mild LVH, RV normal - Echo (10/24): EF 40-45%, mild LV dilation, mildly decreased RV systolic function, moderate-severe MR with splay artifact and PISA ERO 0.44 cm^2, PASP 71 mmHg, dilated IVC.   - Echo (11/24): EF 45-50%, gHK. RV function normal, mildly enlarged, RVSP 52.8, severely dilated LA and RA. Mild MR s/p mTEER.  - Echo (12/24): EF 50%, mild RV dysfunction, PASP 49.5 mmHg, s/p Mitraclip with mean gradient 4 and mild MR.  3. Hyperlipidemia: Myalgias with atorvastatin, Livalo, and pravastatin .  4. CKD stage 3 5. OA: s/p THR.  6. H/o melanoma 7. Anemia 8. Diverticulosis 9. OSA: Uses CPAP.  10. Pulmonary nodules: Followed by Dr. Katragadda.  11. Pancreatic pseudocysts 12. PAD: ABIs (2/20) were 0.94 on right and 1.03 on left.  13. Papillary thyroid  carcinoma: s/p thyroidectomy 8/22.  14. Mitral regurgitation: At lease moderately severe on 10/24 echo. mTEER 11/24, Mitraclip x 2.  - Echo 12/24 with mild MR s/p mTEER, mean gradient 4 mmHg across MV.   SH: Married with 3 children, lives in [place], retired, quit smoking in 2001.    FH: Mother with MI  ROS: All systems reviewed and negative except as per HPI.  Current Outpatient Medications  Medication Sig Dispense Refill   acetaminophen  (TYLENOL ) 500 MG tablet Take 1,000 mg by mouth 2 (two) times daily.     Ascorbic Acid (VITAMIN C GUMMIE PO)  Take 1 tablet by mouth in the morning.     calcium  carbonate (TUMS EX) 750 MG chewable tablet Chew 1 tablet by mouth daily.     cholecalciferol (VITAMIN D3) 25 MCG (1000 UNIT) tablet Take 1,000 Units by mouth in the morning.     dorzolamide -timolol  (COSOPT ) 22.3-6.8 MG/ML ophthalmic solution Place 1 drop into the right eye 2 (two) times daily.     empagliflozin  (JARDIANCE ) 10 MG TABS tablet TAKE ONE TABLET DAILY 90 tablet 0   escitalopram  (LEXAPRO ) 20 MG tablet Take 1 tablet (20 mg total) by mouth daily. 90 tablet 0   Evolocumab  (REPATHA  SURECLICK) 140 MG/ML SOAJ INJECT 140MG ( ) INTO SKIN EVERY 2 WEEKS 6 mL 3   Ferrous Sulfate  (IRON  PO) Take 9 mg by mouth in the morning. Gummie     finasteride  (PROSCAR ) 5 MG tablet Take 1 tablet (5 mg total) by mouth daily. 30 tablet 2  FOLIC ACID  PO Take 400 mcg by mouth in the morning.     gabapentin  (NEURONTIN ) 100 MG capsule Take 200 mg by mouth 2 (two) times daily.      latanoprost  (XALATAN ) 0.005 % ophthalmic solution Place 1 drop into the right eye daily at 12 noon.     levothyroxine  (SYNTHROID ) 137 MCG tablet TAKE ONE TABLET DAILY BEFORE BREAKFAST 90 tablet 1   metoprolol  succinate (TOPROL  XL) 25 MG 24 hr tablet Take 0.5 tablets (12.5 mg total) by mouth daily. 45 tablet 3   Polyethyl Glycol-Propyl Glycol 0.4-0.3 % SOLN Apply 1 drop to eye 3 (three) times daily as needed (dry/irritated eyes.). Systane     potassium chloride  SA (KLOR-CON  M) 20 MEQ tablet Take 1 tablet (20 mEq total) by mouth daily. 30 tablet 6   RHOPRESSA  0.02 % SOLN Place 1 drop into the right eye at bedtime.     tamsulosin  (FLOMAX ) 0.4 MG CAPS Take 0.4 mg by mouth at bedtime.      XARELTO  15 MG TABS tablet TAKE ONE TABLET DAILY WITH SUPPER 90 tablet 1   amiodarone  (PACERONE ) 200 MG tablet Take 1 tablet (200 mg total) by mouth daily.     torsemide  (DEMADEX ) 20 MG tablet Take 3 tablets (60 mg total) by mouth in the morning AND 2 tablets (40 mg total) every evening. 180 tablet 3   No  current facility-administered medications for this encounter.   Wt Readings from Last 3 Encounters:  04/23/24 95.5 kg (210 lb 9.6 oz)  04/20/24 95.1 kg (209 lb 10.5 oz)  03/12/24 95.3 kg (210 lb 3.2 oz)   BP 110/74   Pulse 86   Ht 5' 6 (1.676 m)   Wt 95.5 kg (210 lb 9.6 oz)   SpO2 96%   BMI 33.99 kg/m   Physical Exam: General: NAD Neck: JVP 8-9 cm, no thyromegaly or thyroid  nodule.  Lungs: Clear to auscultation bilaterally with normal respiratory effort. CV: Nondisplaced PMI.  Heart regular S1/S2, no S3/S4, no murmur.  No peripheral edema.  No carotid bruit.  Normal pedal pulses.  Abdomen: Soft, nontender, no hepatosplenomegaly, no distention.  Skin: Intact without lesions or rashes.  Neurologic: Alert and oriented x 3.  Psych: Normal affect. Extremities: No clubbing or cyanosis.  HEENT: Normal.   Assessment/Plan: 1. Chronic systolic CHF: Suspect nonischemic cardiomyopathy.  EF 15-20% on 6/16 TEE.  Cardiac MRI in 5/16 showed a LGE pattern that was not suggestive of coronary disease (?myocarditis or infiltrative disease).  There may be a component of tachycardia-mediated cardiomyopathy.  However, the cardiac MRI appears to have been done when he was in NSR. He has Medtronic CRT-D device.  Echo (6/22) with EF 40-45%.  Echo (10/24) showed EF 40-45%, mild LV dilation, mildly decreased RV systolic function, moderate-severe MR with splay artifact and PISA ERO 0.44 cm^2, PASP 71 mmHg, dilated IVC. RHC 11/4 with significant mitral regurg, elevated filling pressures and low output, and severe mixed pulm venous and arterial hypertension. Mitral regurgitation lead to worsening CHF. Now s/p mTEER 11/24. Echo 12/24 post-mTEER showed EF 50%, LV with GHK, RV mildly reduced, LA mod dilated, s/p mitral clip placement, mild MR with mean gradient 4 mmHg. Patient has been more frequently in atrial fibrillation recently. On review of device interrogation, BiV pacing appears to drop when he is in AF.  NYHA  class III symptoms with volume overload by exam though Optivol stable (weight is up also). GDMT has been limited in past by orthostatic symptoms and  CKD.  - Increase torsemide  to 60 qam/40 qpm.  BMET/BNP in 10 days.  - He has been off beta blocker due to low BP.  I am going to decrease amiodarone  to 200 mg daily and add back Toprol  XL 12.5 mg at bedtime.  - Off spironolactone  with hyperkalemia.  - Continue Jardiance  10 mg daily.  - He will stay off Entresto  with worsening renal function.  2. Atrial fibrillation: Persistent. Initial AF ablation in 2021, attempted repeat ablation in 5/25 but already with electrical isolation of walls and veins, no ablation done.  Since then, patient appears to be in AF most of the time.  BiV pacing percentage is lower in AF.  He is on amiodarone  200 mg bid currently.  - I will decrease amiodarone  back to 200 mg daily.  LFTs normal recently, check TSH today.  - Continue Xarelto  15 mg daily. CBC today - I will discuss further steps with Dr. Inocencio => ?retry DCCV though think it would be unlikely to hold vs consider AV nodal ablation to promote more efficient BiV pacing.  3. Hyperlipidemia: Continue Repatha .Check lipids today.  4. OSA: Encouraged compliance with CPAP.   5. PAD: Minimal claudication.  Continue Repatha .  6. CKD: Stage 3.  Follows with nephrology in [place].  - Continue SGLT2i. 7. Mitral regurgitation: s/p mTEER 11/24. Last echo in 12/24 with mild MR and mean gradient 4 mmHg s/p Mitraclip x 2.   Follow up in 2 months with APP.   I spent 31 minutes reviewing records, interviewing/examining patient, and managing orders.     [name],  04/23/2024

## 2024-04-23 NOTE — Patient Instructions (Signed)
 DECREASE Amiodarone  to 200 mg daily.  START Toprol  XL 12.5 mg ( 1/2 Tab)  nightly.  CHANGE Torsemide  to 60 mg in the morning and 40 mg in the evening.  Labs done today, your results will be available in MyChart, we will contact you for abnormal readings.  Repeat blood work in 10 days at [facility].  Your physician recommends that you schedule a follow-up appointment in: 2 months.  If you have any questions or concerns before your next appointment please send us  a message through [place] or call our office at [phone].    TO LEAVE A MESSAGE FOR THE NURSE SELECT OPTION 2, PLEASE LEAVE A MESSAGE INCLUDING: YOUR NAME DATE OF BIRTH CALL BACK NUMBER REASON FOR CALL**this is important as we prioritize the call backs  YOU WILL RECEIVE A CALL BACK THE SAME DAY AS LONG AS YOU CALL BEFORE 4:00 PM  At the [facility], you and your health needs are our priority. As part of our continuing mission to provide you with exceptional heart care, we have created designated Provider Care Teams. These Care Teams include your primary Cardiologist (physician) and Advanced Practice Providers (APPs- Physician Assistants and Nurse Practitioners) who all work together to provide you with the care you need, when you need it.   You may see any of the following providers on your designated Care Team at your next follow up: Dr Toribio Fuel Dr [name] Dr. Ria Commander Dr. Morene Brownie Amy Lenetta, NP Caffie Shed, [place] [facility] [place], [place] Beckey Coe, NP [place] Lee, NP Ellouise Class, NP Tinnie Redman, PharmD Jaun Bash, PharmD   Please be sure to bring in all your medications bottles to every appointment.    Thank you for choosing [facility]

## 2024-04-27 ENCOUNTER — Ambulatory Visit: Attending: Cardiology

## 2024-04-27 DIAGNOSIS — I5022 Chronic systolic (congestive) heart failure: Secondary | ICD-10-CM | POA: Diagnosis not present

## 2024-04-27 DIAGNOSIS — Z95810 Presence of automatic (implantable) cardiac defibrillator: Secondary | ICD-10-CM

## 2024-05-01 NOTE — Progress Notes (Signed)
 EPIC Encounter for ICM Monitoring  Patient Name: Chad Brown is a 83 y.o. male Date: 05/01/2024 Primary Care Physican: [name], MD Primary Cardiologist: Nishan/McLean Electrophysiologist: Inocencio Pore Pacing: 94.3 %  10/23/2023 Weight: 198 lbs 11/27/2023 Weight: 200 lbs 02/03/2024 Weight: 203 lbs 01/30/2024 Weight: 206 lbs 03/09/2024 Weight: 206 lbs 03/10/2024 Weight: 204 lbs 04/23/2024 Office Weight: 210 lbs 05/01/2024 Home Weight: 203-206 lbs    Since 24-Apr-2024 Time in AT/AF 8.0 hours/day (33.3 %) Treated AT/AF 16   Spoke with patient and heart failure questions reviewed.  Transmission results reviewed.  Pt asymptomatic for fluid accumulation.  Reports feeling well at this time and voices no complaints.     Optivol thoracic impedance suggesting normal fluid levels within the last mont.  Pt is followed by Afib Clinic.  HF clinic aware of Afib per 7/ 24 note. Prescribed:  Torsemide  20 mg take 3 tablets (60 mg total) every morning and 2 tablets (40 mg total) by mouth every evening. Potassium 20 mEq take 1 tablet by mouth daily   Labs: 04/13/2024 Creatinine 2.37, BUN 42, Potassium 4.3, Sodium 140, GFR 27 01/31/2024 Creatinine 2.74, BUN 40, Potassium 4.5, Sodium 139 01/20/2024 Creatinine 2.94, BUN 50, Potassium 5.0, Sodium 146, GFR 20 11/28/2023 Creatinine 2.77, BUN 43, Potassium 4.4, Sodium 139, GFR 22 10/07/2023 Creatinine 2.42, BUN 42, Potassium 4.6, Sodium 141, GFR 26 09/26/2023 Creatinine 2.62, BUN 46, Potassium 4.5, Sodium 140, GFR 24 09/06/2023 Creatinine 2.25, BUN 36, Potassium 4.4, Sodium 143, GFR 28 08/22/2023 Creatinine 2.44, BUN 43, Potassium 4.6, Sodium 134, GFR 26   A complete set of results can be found in Results Review.   Recommendations:  No changes and encouraged to call if experiencing any fluid symptoms.  He will have labs drawn at [facility] as recommended by Dr Rolan after Torsemide  dosage.    Follow-up plan: ICM clinic phone appointment on 06/02/2024.   91 day device clinic remote transmission 06/25/2024.     EP/Cardiology Next Office Visit:  05/13/2024 with [name], PA.   06/15/2024 with HF clinic.   Copy of ICM check sent to Dr. Inocencio.     3 month ICM trend: 04/27/2024.    12-14 Month ICM trend:     [name], RN 05/01/2024 8:38 AM

## 2024-05-04 ENCOUNTER — Other Ambulatory Visit (HOSPITAL_COMMUNITY)
Admission: RE | Admit: 2024-05-04 | Discharge: 2024-05-04 | Disposition: A | Discharge status: Home / Self Care | Source: Ambulatory Visit | Attending: "Endocrinology | Admitting: "Endocrinology

## 2024-05-04 ENCOUNTER — Other Ambulatory Visit (HOSPITAL_COMMUNITY)
Admission: RE | Admit: 2024-05-04 | Discharge: 2024-05-04 | Disposition: A | Discharge status: Home / Self Care | Source: Ambulatory Visit | Attending: Cardiology | Admitting: Cardiology

## 2024-05-04 DIAGNOSIS — C73 Malignant neoplasm of thyroid gland: Secondary | ICD-10-CM | POA: Insufficient documentation

## 2024-05-04 DIAGNOSIS — I5022 Chronic systolic (congestive) heart failure: Secondary | ICD-10-CM | POA: Diagnosis not present

## 2024-05-04 DIAGNOSIS — E890 Postprocedural hypothyroidism: Secondary | ICD-10-CM | POA: Insufficient documentation

## 2024-05-04 LAB — BASIC METABOLIC PANEL WITH GFR
Anion gap: 13 (ref 5–15)
BUN: 32 mg/dL — ABNORMAL HIGH (ref 8–23)
CO2: 27 mmol/L (ref 22–32)
Calcium: 8.4 mg/dL — ABNORMAL LOW (ref 8.9–10.3)
Chloride: 103 mmol/L (ref 98–111)
Creatinine, Ser: 2.34 mg/dL — ABNORMAL HIGH (ref 0.61–1.24)
GFR, Estimated: 27 mL/min — ABNORMAL LOW (ref >60)
Glucose, Bld: 110 mg/dL — ABNORMAL HIGH (ref 70–99)
Potassium: 4 mmol/L (ref 3.5–5.1)
Sodium: 143 mmol/L (ref 135–145)

## 2024-05-04 LAB — T4, FREE: Free T4: 1.19 ng/dL — ABNORMAL HIGH (ref 0.61–1.12)

## 2024-05-04 LAB — TSH: TSH: 1.724 u[IU]/mL (ref 0.350–4.500)

## 2024-05-06 LAB — THYROGLOBULIN ANTIBODY: Thyroglobulin Antibody: <1 [IU]/mL (ref 0.0–0.9)

## 2024-05-07 ENCOUNTER — Encounter: Payer: Self-pay | Admitting: Cardiology

## 2024-05-11 ENCOUNTER — Encounter: Payer: Self-pay | Admitting: "Endocrinology

## 2024-05-11 ENCOUNTER — Ambulatory Visit: Payer: Medicare PPO | Admitting: "Endocrinology

## 2024-05-11 VITALS — BP 106/64 | HR 60 | Ht 66.0 in | Wt 209.4 lb

## 2024-05-11 DIAGNOSIS — E890 Postprocedural hypothyroidism: Secondary | ICD-10-CM | POA: Diagnosis not present

## 2024-05-11 DIAGNOSIS — C73 Malignant neoplasm of thyroid gland: Secondary | ICD-10-CM

## 2024-05-11 NOTE — Progress Notes (Signed)
 05/11/2024, 2:28 PM   Endocrinology follow-up note  Subjective:    Patient ID: Chad Brown, male    DOB: 1941/01/14, PCP [name], MD   Past Medical History:  Diagnosis Date   Adenomatous colon polyp    tubular   AICD (automatic cardioverter/defibrillator) present 03/08/2015   MDT CRTD dual pacemaker and defib   Anemia    iron  deficient   Arthritis    about all my joints; hands, knees, back (03/08/2015)   Atherosclerosis    Cataract    left eye small   Cholelithiasis    gallstones   Chronic systolic CHF (congestive heart failure) (HCC)    a. New dx 12/2012 ? NICM, may be r/t afib. b. Nuc 03/2013 - normal;  c. 03/2015 TEE EF 15-20%.   Colon polyp, hyperplastic 01/2015   removed precancerous lesions   Depression    Diverticulosis    Dysrhythmia    afib   GERD (gastroesophageal reflux disease)    Glaucoma    right eye   Hyperlipidemia    Hypertension    Melanoma of eye (HCC) 2000's   right; it's never been biopsied   Melanoma of lower leg (HCC) 2015   left; right at my knee   Myocardial infarction (HCC) 1998   OSA (obstructive sleep apnea) 01/04/2016   no longer tolerates cpap   Peripheral vision loss, right 2006   Persistent atrial fibrillation (HCC)    a. Dx 12/2012, s/p TEE/DCCV 01/26/13. b. On Xarelto  (CHA2DS2VASc = 3);  c. 03/2015 TEE (EF 15-20%, no LAA thrombus) and DCCV - amio increased to 200 mg bid.   Pneumonia    S/P mitral valve clip implantation 08/21/2023   Placement of 1 XTW in the A2/P2 position and 1 NTW in the A1/P1 position by Dr. Wendel and Dr. Wonda   Urinary hesitancy due to benign prostatic hypertrophy    Past Surgical History:  Procedure Laterality Date   ATRIAL FIBRILLATION ABLATION N/A 11/03/2019   Procedure: ATRIAL FIBRILLATION ABLATION;  Surgeon: Kelsie Agent, MD;  Location: [facility] INVASIVE CV LAB;  Service: Cardiovascular;  Laterality: N/A;   ATRIAL FIBRILLATION ABLATION N/A  02/13/2024   Procedure: ATRIAL FIBRILLATION ABLATION;  Surgeon: Inocencio Soyla Lunger, MD;  Location: [facility];  Service: Cardiovascular;  Laterality: N/A;   BACK SURGERY     upper back, cannot turn neck well   BIV ICD GENERATOR CHANGEOUT N/A 09/16/2023   Procedure: BIV ICD GENERATOR CHANGEOUT;  Surgeon: Inocencio Soyla Lunger, MD;  Location: [facility] INVASIVE CV LAB;  Service: Cardiovascular;  Laterality: N/A;   CARDIAC CATHETERIZATION  1998   CARDIOVERSION N/A 01/26/2013   Procedure: CARDIOVERSION;  Surgeon: Redell GORMAN Shallow, MD;  Location: [facility] ENDOSCOPY;  Service: Cardiovascular;  Laterality: N/A;   CARDIOVERSION N/A 03/23/2015   Procedure: CARDIOVERSION;  Surgeon: Oneil JAYSON Parchment, MD;  Location: [facility] ENDOSCOPY;  Service: Cardiovascular;  Laterality: N/A;   CARDIOVERSION N/A 08/14/2017   Procedure: CARDIOVERSION;  Surgeon: Delford Maude JAYSON, MD;  Location: [facility] ENDOSCOPY;  Service: Cardiovascular;  Laterality: N/A;   CATARACT EXTRACTION Right ~ 2006   COLONOSCOPY WITH PROPOFOL  N/A 02/10/2015   Procedure: COLONOSCOPY WITH PROPOFOL ;  Surgeon: Lupita FORBES Commander, MD;  Location: [facility] ENDOSCOPY;  Service:  Endoscopy;  Laterality: N/A;   COLONOSCOPY WITH PROPOFOL  N/A 08/07/2016   Procedure: COLONOSCOPY WITH PROPOFOL ;  Surgeon: Lupita FORBES Commander, MD;  Location: [facility] ENDOSCOPY;  Service: Endoscopy;  Laterality: N/A;   ENTEROSCOPY N/A 08/17/2015   Procedure: ENTEROSCOPY;  Surgeon: Lupita FORBES Commander, MD;  Location: [facility] ENDOSCOPY;  Service: Endoscopy;  Laterality: N/A;   EP IMPLANTABLE DEVICE N/A 03/08/2015   MDT Fraser Hawthorne CRT-D for nonischemic CM by Dr Kelsie for primary prevention   GLAUCOMA SURGERY Right ~ 2006   put 3 stents in to drain fluid (03/08/2015) not successful, sent to [facility] to try to get last stent out   HOT HEMOSTASIS N/A 08/07/2016   Procedure: HOT HEMOSTASIS (ARGON PLASMA COAGULATION/BICAP);  Surgeon: Lupita FORBES Commander, MD;  Location: [facility];  Service: Endoscopy;  Laterality: N/A;   INCISION AND DRAINAGE  ABSCESS POSTERIOR CERVICALSPINE  05/2012   JOINT REPLACEMENT     MELANOMA EXCISION Left 2015   lower leg; right at my knee   REFRACTIVE SURGERY Right ~ 2006 X 2   twice; both done at [facility] (03/08/2015   RIGHT HEART CATH N/A 08/05/2023   Procedure: RIGHT HEART CATH;  Surgeon: Rolan Ezra RAMAN, MD;  Location: [facility] INVASIVE CV LAB;  Service: Cardiovascular;  Laterality: N/A;   SURGERY SCROTAL / TESTICULAR Right 1990's   TEE WITHOUT CARDIOVERSION N/A 01/26/2013   Procedure: TRANSESOPHAGEAL ECHOCARDIOGRAM (TEE);  Surgeon: Redell RAMAN Shallow, MD;  Location: [facility] ENDOSCOPY;  Service: Cardiovascular;  Laterality: N/A;  [name] anes. /    TEE WITHOUT CARDIOVERSION N/A 10/05/2014   Procedure: TRANSESOPHAGEAL ECHOCARDIOGRAM (TEE)  with cardioversion;  Surgeon: Aleene JINNY Passe, MD;  Location: [facility] ENDOSCOPY;  Service: Cardiovascular;  Laterality: N/A;  12:52 synched cardioversion at 120 joules,...afib to SR...12 lead EKG ordered.SABRASABRACardiozem d/c'ed per MD verbal order at SR   TEE WITHOUT CARDIOVERSION N/A 03/23/2015   Procedure: TRANSESOPHAGEAL ECHOCARDIOGRAM (TEE);  Surgeon: Oneil JAYSON Parchment, MD;  Location: [facility] ENDOSCOPY;  Service: Cardiovascular;  Laterality: N/A;   TEE WITHOUT CARDIOVERSION N/A 11/02/2019   Procedure: TRANSESOPHAGEAL ECHOCARDIOGRAM (TEE);  Surgeon: Delford Maude JAYSON, MD;  Location: [facility] ENDOSCOPY;  Service: Cardiovascular;  Laterality: N/A;   TEE WITHOUT CARDIOVERSION N/A 08/05/2023   Procedure: TRANSESOPHAGEAL ECHOCARDIOGRAM;  Surgeon: Rolan Ezra RAMAN, MD;  Location: [facility] INVASIVE CV LAB;  Service: Cardiovascular;  Laterality: N/A;   THORACIC SPINE SURGERY  03/2000   ground calcium  deposits from upper thoracic (01/26/2013)   THYROIDECTOMY N/A 05/11/2021   Procedure: TOTAL THYROIDECTOMY;  Surgeon: Eletha Boas, MD;  Location: [facility] ORS;  Service: General;  Laterality: N/A;   TOTAL HIP ARTHROPLASTY Right 06/2007   TRANSCATHETER MITRAL EDGE TO EDGE REPAIR N/A 08/21/2023   Procedure: TRANSCATHETER MITRAL EDGE TO EDGE  REPAIR;  Surgeon: Thukkani, Arun K, MD;  Location: [facility] INVASIVE CV LAB;  Service: Cardiovascular;  Laterality: N/A;   TRANSESOPHAGEAL ECHOCARDIOGRAM (CATH LAB) N/A 08/09/2023   Procedure: TRANSESOPHAGEAL ECHOCARDIOGRAM;  Surgeon: Rolan Ezra RAMAN, MD;  Location: [facility] INVASIVE CV LAB;  Service: Cardiovascular;  Laterality: N/A;   TRANSESOPHAGEAL ECHOCARDIOGRAM (CATH LAB) N/A 08/21/2023   Procedure: TRANSESOPHAGEAL ECHOCARDIOGRAM;  Surgeon: Wendel Lurena POUR, MD;  Location: [facility] INVASIVE CV LAB;  Service: Cardiovascular;  Laterality: N/A;   Social History   Socioeconomic History   Marital status: Married    Spouse name: Not on file   Number of children: 3   Years of education: Not on file   Highest education level: Not on file  Occupational History   Occupation: retired  Tobacco Use  Smoking status: Former    Current packs/day: 0.00    Average packs/day: 3.0 packs/day for 48.0 years (144.0 ttl pk-yrs)    Types: Cigarettes    Start date: 09/28/1952    Quit date: 09/28/2000    Years since quitting: 23.6   Smokeless tobacco: Never   Tobacco comments:    Former smoker 09/06/23  Vaping Use   Vaping status: Never Used  Substance and Sexual Activity   Alcohol  use: No   Drug use: No   Sexual activity: Not Currently  Other Topics Concern   Not on file  Social History Narrative   Pt lives in [place] with spouse.  3 children are grown and healthy.   Retired.  Ran a country store for 30 years, previously worked in the [employer] for 16 years   Social Drivers of [employer]: Not on [employer] Insecurity: No Food Insecurity (08/20/2023)   Hunger Vital Sign    Worried About Running Out of Food in the Last Year: Never true    Ran Out of Food in the Last Year: Never true  Transportation Needs: No Transportation Needs (08/20/2023)   PRAPARE - Administrator, Civil Service (Medical): No    Lack of Transportation (Non-Medical): No  Physical  Activity: Inactive (11/03/2020)   Exercise Vital Sign    Days of Exercise per Week: 0 days    Minutes of Exercise per Session: 0 min  Stress: Not on file  Social Connections: Not on file   Family History  Problem Relation Age of Onset   Kidney disease Mother    Heart disease Mother        MI, open heart   Diabetes Mother        dialysis   Leukemia Father    Colon cancer Paternal Uncle    Lung cancer Paternal Uncle        x 2   Prostate cancer Paternal Uncle    Diabetes Maternal Grandmother    Heart attack Maternal Uncle    Diabetes Maternal Aunt        x 3   Diabetes Maternal Uncle    Thyroid  disease Sister    Outpatient Encounter Medications as of 05/11/2024  Medication Sig   acetaminophen  (TYLENOL ) 500 MG tablet Take 1,000 mg by mouth 2 (two) times daily.   amiodarone  (PACERONE ) 200 MG tablet Take 1 tablet (200 mg total) by mouth daily.   Ascorbic Acid (VITAMIN C GUMMIE PO) Take 1 tablet by mouth in the morning.   calcium  carbonate (TUMS EX) 750 MG chewable tablet Chew 1 tablet by mouth daily.   cholecalciferol (VITAMIN D3) 25 MCG (1000 UNIT) tablet Take 1,000 Units by mouth in the morning.   dorzolamide -timolol  (COSOPT ) 22.3-6.8 MG/ML ophthalmic solution Place 1 drop into the right eye 2 (two) times daily.   empagliflozin  (JARDIANCE ) 10 MG TABS tablet TAKE ONE TABLET DAILY   escitalopram  (LEXAPRO ) 20 MG tablet Take 1 tablet (20 mg total) by mouth daily.   Evolocumab  (REPATHA  SURECLICK) 140 MG/ML SOAJ INJECT 140MG ( ) INTO SKIN EVERY 2 WEEKS   Ferrous Sulfate  (IRON  PO) Take 9 mg by mouth in the morning. Gummie   finasteride  (PROSCAR ) 5 MG tablet Take 1 tablet (5 mg total) by mouth daily.   FOLIC ACID  PO Take 400 mcg by mouth in the morning.   gabapentin  (NEURONTIN ) 100 MG capsule Take 200 mg by mouth 2 (two) times daily.    latanoprost  (XALATAN )  0.005 % ophthalmic solution Place 1 drop into the right eye daily at 12 noon.   levothyroxine  (SYNTHROID ) 137 MCG tablet TAKE ONE  TABLET DAILY BEFORE BREAKFAST   metoprolol  succinate (TOPROL  XL) 25 MG 24 hr tablet Take 0.5 tablets (12.5 mg total) by mouth daily.   Polyethyl Glycol-Propyl Glycol 0.4-0.3 % SOLN Apply 1 drop to eye 3 (three) times daily as needed (dry/irritated eyes.). Systane   potassium chloride  SA (KLOR-CON  M) 20 MEQ tablet Take 1 tablet (20 mEq total) by mouth daily.   RHOPRESSA  0.02 % SOLN Place 1 drop into the right eye at bedtime.   tamsulosin  (FLOMAX ) 0.4 MG CAPS Take 0.4 mg by mouth at bedtime.    torsemide  (DEMADEX ) 20 MG tablet Take 3 tablets (60 mg total) by mouth in the morning AND 2 tablets (40 mg total) every evening.   XARELTO  15 MG TABS tablet TAKE ONE TABLET DAILY WITH SUPPER   No facility-administered encounter medications on file as of 05/11/2024.   ALLERGIES: Allergies  Allergen Reactions   Pravastatin  Sodium Other (See Comments)    Joint and muscle pain   Azithromycin  Diarrhea    VACCINATION STATUS: Immunization History  Administered Date(s) Administered   Fluad Quad(high Dose 65+) 06/10/2019   Fluad Trivalent(High Dose 65+) 07/19/2023   Influenza Inj Mdck Quad With Preservative 06/27/2018   Influenza, High Dose Seasonal PF 06/29/2016, 07/04/2017, 06/29/2020   Influenza, Seasonal, Injecte, Preservative Fre 08/02/2015   Influenza-Unspecified 07/11/2014, 07/18/2015, 08/02/2015   Moderna Sars-Covid-2 Vaccination 12/08/2019, 12/28/2019, 07/27/2020   Pneumococcal Conjugate-13 08/23/2014   Pneumococcal Polysaccharide-23 10/01/2010, 03/04/2018   Pneumococcal-Unspecified 06/02/2011   Td 10/01/2008   Td (Adult), 2 Lf Tetanus Toxid, Preservative Free 10/01/2008   Tdap 12/16/2014   Zoster Recombinant(Shingrix) 12/24/2017, 12/27/2017, 03/04/2018   Zoster, Live 10/01/2010, 08/01/2013, 08/20/2013    HPI Chad Brown is 83 y.o. male who presents today with a medical history as above. he is being seen in follow-up after he was seen in consultation for postsurgical hypothyroidism  and history of thyroid  malignancy requested by [name], MD.  Patient is accompanied by his wife to clinic. History is obtained from the patient as well as chart review. Accordingly, patient is status post total thyroidectomy on May 11, 2021 after workup showed papillary thyroid  malignancy.  This was subsequently followed by Thyrogen  stimulated thyroid  remnant ablation with negative whole body scan completed on October 27, 2021. His previsit thyroid  ultrasound on November 06, 2023 is unremarkable for tumor recurrence or remnant. He has recovered from his surgery completely.  He denies dysphagia, shortness of breath, nor voice change.  Patient has been on regular dose of levothyroxine  ever since.   He remains on levothyroxine  137 mcg p.o. daily before breakfast with good consistency and compliance.   His previsit thyroid  function tests are consistent with appropriate replacement-see below.  -He has no new complaints today.  He has not taken his calcium  in most recent weeks, CMP showing mild hypocalcemia. He denies family history of thyroid  dysfunction or thyroid  malignancy.  He denies any prior history of thyroid  radiation exposure.  He denies dysphagia, shortness of breath, nor voice change.  He denies palpitations, tremors, nor heat/cold intolerance.  His other medical problems include hypertension, hyperlipidemia, coronary artery disease, CHF, on medications management.  His most recent hemoglobin A1c is 5.7% on November 01, 2022.    Review of Systems  Constitutional: + Minimally fluctuating body weight, no fatigue, no subjective hyperthermia, no subjective hypothermia Eyes: no blurry vision,  no xerophthalmia ENT: no sore throat, no nodules palpated in throat, no dysphagia/odynophagia, no hoarseness   Objective:       05/11/2024    8:59 AM 04/23/2024   11:55 AM 04/20/2024    8:32 AM  Vitals with BMI  Height 5' 6 5' 6   Weight 209 lbs 6 oz 210 lbs 10 oz 209 lbs 11 oz  BMI  33.81 34.01   Systolic 106 110 98  Diastolic 64 74 74  Pulse 60 86 72    BP 106/64   Pulse 60   Ht 5' 6 (1.676 m)   Wt 209 lb 6.4 oz (95 kg)   BMI 33.80 kg/m   Wt Readings from Last 3 Encounters:  05/11/24 209 lb 6.4 oz (95 kg)  04/23/24 210 lb 9.6 oz (95.5 kg)  04/20/24 209 lb 10.5 oz (95.1 kg)    Physical Exam  Constitutional:  Body mass index is 33.8 kg/m.,  not in acute distress, normal state of mind Eyes: PERRLA, EOMI, no exophthalmos ENT: moist mucous membranes, + healed post thyroidectomy scar on anterior lower neck,  no gross cervical lymphadenopathy Cardiovascular: normal precordial activity, Regular Rate and Rhythm, no Murmur/Rubs/Gallops   CMP ( most recent) CMP     Component Value Date/Time   NA 143 05/04/2024 1011   NA 146 (H) 01/20/2024 1013   K 4.0 05/04/2024 1011   CL 103 05/04/2024 1011   CO2 27 05/04/2024 1011   GLUCOSE 110 (H) 05/04/2024 1011   BUN 32 (H) 05/04/2024 1011   BUN 50 (H) 01/20/2024 1013   CREATININE 2.34 (H) 05/04/2024 1011   CALCIUM  8.4 (L) 05/04/2024 1011   PROT 7.2 04/13/2024 0948   PROT 6.9 01/20/2024 1013   ALBUMIN 3.5 04/13/2024 0948   ALBUMIN 4.2 01/20/2024 1013   AST 17 04/13/2024 0948   ALT 21 04/13/2024 0948   ALKPHOS 91 04/13/2024 0948   BILITOT 0.6 04/13/2024 0948   BILITOT 0.3 01/20/2024 1013   GFRNONAA 27 (L) 05/04/2024 1011   GFRAA 38 (L) 04/29/2020 1034     Diabetic Labs (most recent): Lab Results  Component Value Date   HGBA1C 5.7 11/02/2022   HGBA1C 5.9 (H) 05/01/2021   HGBA1C 6.5 (H) 10/02/2014     Lipid Panel ( most recent) Lipid Panel     Component Value Date/Time   CHOL 112 04/23/2024 1226   TRIG 125 04/23/2024 1226   HDL 30 (L) 04/23/2024 1226   CHOLHDL 3.7 04/23/2024 1226   VLDL 25 04/23/2024 1226   LDLCALC 57 04/23/2024 1226      Lab Results  Component Value Date   TSH 1.724 05/04/2024   TSH 1.504 04/23/2024   TSH 0.799 01/31/2024   TSH 0.880 01/20/2024   TSH 0.288 (L) 11/06/2023    TSH 0.700 08/05/2023   TSH 1.574 07/17/2023   TSH 1.998 05/09/2023   TSH 3.15 11/01/2022   TSH 9.19 (H) 08/02/2021   FREET4 1.19 (H) 05/04/2024   FREET4 1.10 01/31/2024   FREET4 1.18 (H) 11/06/2023   FREET4 0.91 05/09/2023   FREET4 0.64 08/02/2021   FREET4 0.93 10/29/2014     Assessment & Plan:   1. Papillary thyroid  carcinoma (HCC) 2. Postsurgical hypothyroidism 3.  Hypocalcemia    Chad Brown  is being seen at a kind request of Loreli Elsie JONETTA Raddle., MD. - I have reviewed his new and available thyroid  records and clinically evaluated the patient. - Based on these reviews, he has history of papillary  thyroid  carcinoma status post total thyroidectomy, Thyrogen  stimulated thyroid  remnant ablation with negative whole-body scan completed prior to January 2024.    -Completed he will initiate intensive treatment for differentiated thyroid  cancer with low risk for recurrence and excellent response to treatment. - She will continue to have thyroglobulin and antithyroglobulin antibodies measurement as well as clinical exam every 6 months.  She will be considered for imaging studies if he is future presentations indicate a necessity.  I had a long discussion with him about the need for continued surveillance for cancer recurrence.    His recent thyroid  function tests are consistent with appropriate suppressive treatment.  He is advised to continue levothyroxine  137 mcg p.o. daily before breakfast.     - We discussed about the correct intake of his thyroid  hormone, on empty stomach at fasting, with water, separated by at least 30 minutes from breakfast and other medications,  and separated by more than 4 hours from calcium , iron , multivitamins, acid reflux medications (PPIs). -Patient is made aware of the fact that thyroid  hormone replacement is needed for life, dose to be adjusted by periodic monitoring of thyroid  function tests.  He presents with mild hypocalcemia.  He will continue  to benefit from his calcium  carbonate 1000 mg p.o. twice daily.   He is advised to continue regular follow-up with his PMD for his controlled diabetes, hypertension and hyperlipidemia.   - he is advised to maintain close follow up with [name], MD for primary care needs.  I spent  26  minutes in the care of the patient today including review of labs from Thyroid  Function, CMP, and other relevant labs ; imaging/biopsy records (current and previous including abstractions from other facilities); face-to-face time discussing  his lab results and symptoms, medications doses, his options of short and long term treatment based on the latest standards of care / guidelines;   and documenting the encounter.  Chad Brown  participated in the discussions, expressed understanding, and voiced agreement with the above plans.  All questions were answered to his satisfaction. he is encouraged to contact clinic should he have any questions or concerns prior to his return visit.   Follow up plan: Return in about 6 months (around 11/11/2024) for F/U with Pre-visit Labs.   Ranny Earl, MD [facility] [address] [place], [place] [address] Phone: [phone]  Fax: [phone]     05/11/2024, 2:29 PM  This note was partially dictated with voice recognition software. Similar sounding words can be transcribed inadequately or may not  be corrected upon review.

## 2024-05-12 LAB — THYROGLOBULIN LEVEL: Thyroglobulin: <2 ng/mL

## 2024-05-12 NOTE — Progress Notes (Addendum)
 Cardiology Office Note:  .   Date:  05/12/2024  ID:  Chad Brown, DOB 23-Mar-1941, MRN [id number] PCP: Chad Elsie JONETTA Mickey., MD  [facility] Providers Cardiologist:  [name], MD Sleep Medicine:  [name], MD { EP: Dr. Inocencio  History of Present Illness: .   Chad Brown is a 83 y.o. male w/PMHx of  CKD (IV), OSA, HLD, CKD (III), Papillary thyroid  carcinoma: s/p thyroidectomy 8/22  LBBB, NICM CRT-D VHD (s/p MTEER Nov 2024) AFib   Recurrent AFib, symptomatic Jan 2025, amio increased and planned for DCCV Arrived to his CV in an AV paced rhythm  Saw AFib clinic team 12/11/22, back in AFib (rate controlled/V paced), dizzy, tends to be hypotensive in Afib Little options for his AADs In d/w Dr. Inocencio, re-titrated his amio up in effort to get him back in SR Planned to see EP APP in the clinic (with Dr. Inocencio in clinic as well) Some consideration for poss AV node ablation and consider addition of a CS lead.  I saw him 12/17/23 (as his 91 day post gen change visit) He comes with his wife today Feels OK today They both report though when he is in Afib feel pretty weak, dizzy, fatigued No CP Some DOE, no rest SOB When not in AFib Denies any particular symptoms No bleeding or signs of bleeding Reports good medication compliance including his Xarelto    Given symptomatic AFib w/burden noted 13.2% Discussed with Dr. Inocencio if felt to be an ablation candidate He saw the patient today and discussed ongoing amiodarone  vs ablation (with hopes to come off amio) Reasonable to pursue another ablation procedure, though nearing perhaps age cutoff. Did not think mTEER or renal function prohibited ablation The patient wanted to pursue ablation Will continue amio BID as planned/see him back in a month, pre-ablation atrial therpaies left programmed on  EPS: 02/13/24 demonstrated electrical isolation of the posterior wall and the four pulmonary veins..  Entrance and exit  block were confirmed. Due to isolation, no ablation was performed   At his AFib clinic visit 03/12/24, increasing AFib burden, his amiodarone  dose increased  At his AHF visit 04/23/24, device interrogation noted mostly AFib now Lower BPs with orthostatic symptoms Off BB 2/2 low BPs Amio reduced Toprol  added Keeping off Entresto  with declining renal function Planned to discuss rhytm management options > perhaps time to consider AV node ablation  Today's visit his post ablation visit, discuss AV node ablation ROS:   He is accompanied by his wife. He feels fairly well No CP, palpitations or cardiac awareness No rest SOB, mentions sometimes when talking he feels winded, but with walking feels like he has good stamina, minimal DOE No near syncope or syncope No bleeding or signs of bleeding  Home weights are stable, down a few pounds BPs 90's-100's  HRs 60's-80's  Device information MDT CRT-D, implanted 03/08/2015, gen change 09/16/23  Arrhythmia/AAD hx Afib found 2014 AFib ablation 11/03/19 (Dr.Allred) Amiodarone  started 2021  Recurrent AFib >> during admission with severe MR and > mTEER > amio gtt and re-loaded Nov 2024  02/13/24: EPS found electrical isolation of all four pulmonary veins and the posterior wall, no ablation performed    Studies Reviewed: SABRA    EKG done today and reviewed by myself AV paced  DEVICE interrogation done today and reviewed by myself Battery and auto lead measurements are good He is AV paced. BP is 95% He has atrial therapies on, largely ineffective though has had  success on a couple times OptiVol is good No VT   09/13/23: TTE 1. Left ventricular ejection fraction, by estimation, is 50%. The left  ventricle has mildly decreased function. The left ventricle demonstrates  global hypokinesis. Left ventricular diastolic parameters are  indeterminate.   2. Right ventricular systolic function is mildly reduced. The right  ventricular size is normal.  There is moderately elevated pulmonary artery  systolic pressure. The estimated right ventricular systolic pressure is  49.5 mmHg.   3. Left atrial size was moderately dilated.   4. S/p Mitraclip placement. The mitral valve has been repaired/replaced.  Mild mitral valve regurgitation. Mild mitral stenosis. The mean mitral  valve gradient is 4.0 mmHg.   5. The aortic valve is tricuspid. There is mild calcification of the  aortic valve. Aortic valve regurgitation is trivial. No aortic stenosis is  present.   6. Aortic dilatation noted. There is mild dilatation of the ascending  aorta, measuring 40 mm.   7. The inferior vena cava is normal in size with <50% respiratory  variability, suggesting right atrial pressure of 8 mmHg.    11/03/2019 EPS/ablation CONCLUSIONS: 1. Atrial fibrillation upon presentation.   2. Intracardiac echo reveals a moderate sized left atrium with a small R middle PV and a short common ostium to the left PVs, without evidence of pulmonary vein stenosis. 3. Successful electrical isolation and anatomical encircling of all four pulmonary veins with radiofrequency current.  A WACA approach was used 3. Additional left atrial ablation was performed with a standard box lesion created along the posterior wall of the left atrium 4. Atrial fibrillation successfully ablated into sinus rhythm 5. No early apparent complications.   Risk Assessment/Calculations:    Physical Exam:   VS:  There were no vitals taken for this visit.   Wt Readings from Last 3 Encounters:  05/11/24 209 lb 6.4 oz (95 kg)  04/23/24 210 lb 9.6 oz (95.5 kg)  04/20/24 209 lb 10.5 oz (95.1 kg)    GEN: Well nourished, well developed in no acute distress NECK: No JVD; No carotid bruits CARDIAC: RRR, no murmurs, rubs, gallops RESPIRATORY: CTA b/l without rales, wheezing or rhonchi  ABDOMEN: Soft, non-tender, non-distended EXTREMITIES: no edema; No deformity   ICD site: is stable, no thinning,  fluctuation, tethering  ASSESSMENT AND PLAN: .    CRT-D intact function no programming changes made  persistent AFib CHA2DS2Vasc is 3, on Xarelto , appropriately dosed Has has converted back to AV paced rhythm Perhaps small dose BB has helped He is a little reluctant about the idea of AV node ablation  Will monitor with current amio/BB regime  He sees HF team in a month > plan EP in 3 mo, sooner if needed  VHD S/p mTEER Functioning well on his Dec 2024 echo  C/w Dr. Karalee team   NICM 5.   LBBB No symptoms or exam findings of volume OL OptiVol looks good Improved LVEF by his last echo Dec 2024 BP back up 95% in SR C/w Dr. Marciano   6.   Secondary hypercoagulable state 2/2 AFib     Dispo: remotes as usual, as above otherwise, sooner if needed  Signed, Charlies Macario Arthur, PA-C

## 2024-05-13 ENCOUNTER — Ambulatory Visit: Attending: Physician Assistant | Admitting: Physician Assistant

## 2024-05-13 ENCOUNTER — Encounter: Payer: Self-pay | Admitting: Physician Assistant

## 2024-05-13 VITALS — BP 120/82 | HR 59 | Resp 16 | Ht 66.0 in | Wt 206.0 lb

## 2024-05-13 DIAGNOSIS — I4819 Other persistent atrial fibrillation: Secondary | ICD-10-CM

## 2024-05-13 DIAGNOSIS — I4891 Unspecified atrial fibrillation: Secondary | ICD-10-CM

## 2024-05-13 DIAGNOSIS — D6869 Other thrombophilia: Secondary | ICD-10-CM

## 2024-05-13 DIAGNOSIS — Z95810 Presence of automatic (implantable) cardiac defibrillator: Secondary | ICD-10-CM

## 2024-05-13 DIAGNOSIS — I428 Other cardiomyopathies: Secondary | ICD-10-CM | POA: Diagnosis not present

## 2024-05-13 LAB — CUP PACEART INCLINIC DEVICE CHECK
Date Time Interrogation Session: 20250813184344
Implantable Lead Connection Status: 753985
Implantable Lead Connection Status: 753985
Implantable Lead Connection Status: 753985
Implantable Lead Implant Date: 20160607
Implantable Lead Implant Date: 20160607
Implantable Lead Implant Date: 20160607
Implantable Lead Location: 753858
Implantable Lead Location: 753859
Implantable Lead Location: 753860
Implantable Lead Model: 4598
Implantable Lead Model: 5076
Implantable Pulse Generator Implant Date: 20241216

## 2024-05-13 NOTE — Patient Instructions (Signed)
 Medication Instructions:   Your physician recommends that you continue on your current medications as directed. Please refer to the Current Medication list given to you today.   *If you need a refill on your cardiac medications before your next appointment, please call your pharmacy*   Lab Work: NONE ORDERED  TODAY    If you have labs (blood work) drawn today and your tests are completely normal, you will receive your results only by: MyChart Message (if you have MyChart) OR A paper copy in the mail If you have any lab test that is abnormal or we need to change your treatment, we will call you to review the results.  Testing/Procedures: NONE ORDERED  TODAY    Follow-Up: At [facility], you and your health needs are our priority.  As part of our continuing mission to provide you with exceptional heart care, our providers are all part of one team.  This team includes your primary Cardiologist (physician) and Advanced Practice Providers or APPs (Physician Assistants and Nurse Practitioners) who all work together to provide you with the care you need, when you need it.  Your next appointment:   3 month(s)   Provider:   Soyla Norton, MD  ( CONTACT  CASSIE HALL/ ANGELINE HAMMER FOR EP SCHEDULING ISSUES )   We recommend signing up for the patient portal called MyChart.  Sign up information is provided on this After Visit Summary.  MyChart is used to connect with patients for Virtual Visits (Telemedicine).  Patients are able to view lab/test results, encounter notes, upcoming appointments, etc.  Non-urgent messages can be sent to your provider as well.   To learn more about what you can do with MyChart, go to [email or web address].   Other Instructions

## 2024-05-16 ENCOUNTER — Ambulatory Visit: Payer: Self-pay | Admitting: Cardiology

## 2024-05-22 ENCOUNTER — Telehealth: Payer: Self-pay

## 2024-05-22 NOTE — Telephone Encounter (Signed)
 Spoke w/ patient - he is scheduled to follow up with Dr. Inocencio on 9/9.

## 2024-05-22 NOTE — Telephone Encounter (Signed)
 Alert remote transmission:  AT/AF Daily Burden > Threshold. Presenting rhythm AT/AFL with oversensing and ATP, in progress from 8/21 @ 12:49. Hx of PAF Xarelto  per EPIC   _____________________________________________________________________  Device alert noted for presenting rhythm w/ oversensing and ATP delivered. Pt hx AF & on OAC per EPIC. Recent OV w/ [name], PA on 05/13/24.   Spoke w/ patient requesting a manual transmission to review presenting rhythm and to assess for any further oversensing. Patient states he woke up feeling weak this morning, but no other symptoms present. States he will send a transmission at this time.   Will continue to monitor for incoming transmission and update accordingly.

## 2024-05-22 NOTE — Telephone Encounter (Signed)
 Manual transmission received. Called Medtr[employer] tech services to discuss presenting rhythm and issue of oversensing. Tech services unable to verify what presenting rhythm patient is in, but confident there is an atrial arrhythmia occurring w/ issues of oversensing.   Returned call to patient and informed of recommendation to see AF Clinic d/t recurrence of atrial arrhythmia and need to interrogate device. Patient states medication compliance. Verbalized understanding.   Will forward to scheduling team for F/U w/ AF Clinic.   Patient informed to call device clinic back for any further questions or concerns.

## 2024-05-28 ENCOUNTER — Other Ambulatory Visit: Payer: Self-pay | Admitting: Physician Assistant

## 2024-05-28 DIAGNOSIS — Z9889 Other specified postprocedural states: Secondary | ICD-10-CM

## 2024-06-02 ENCOUNTER — Ambulatory Visit: Attending: Cardiology

## 2024-06-02 DIAGNOSIS — Z95810 Presence of automatic (implantable) cardiac defibrillator: Secondary | ICD-10-CM

## 2024-06-02 DIAGNOSIS — I5022 Chronic systolic (congestive) heart failure: Secondary | ICD-10-CM | POA: Diagnosis not present

## 2024-06-04 NOTE — Progress Notes (Signed)
 EPIC Encounter for ICM Monitoring  Patient Name: Chad Brown is a 83 y.o. male Date: 06/04/2024 Primary Care Physican: Chad Brown., MD Primary Cardiologist: Chad Brown Electrophysiologist: Chad Brown Pacing: 94.3 %  10/23/2023 Weight: 198 lbs 11/27/2023 Weight: 200 lbs 02/03/2024 Weight: 203 lbs 01/30/2024 Weight: 206 lbs 03/09/2024 Weight: 206 lbs 03/10/2024 Weight: 204 lbs 04/23/2024 Office Weight: 210 lbs 05/01/2024 Home Weight: 203-206 lbs    Since 26-May-2024 Time in AT/AF  9.7 hours/day (40.3 %) Treated AT/AF 11 AT/AF Pace-Terminated Episodes  1 of 11 (9.1 %)   Spoke with patient and heart failure questions reviewed.  Transmission results reviewed.  Pt reports he had a GI virus this past week and still feels weak.     Optivol thoracic impedance suggesting normal fluid levels within the last month.  Pt is followed by Afib Clinic.  Pt scheduled to see Dr Chad 9/9 regarding rhythm. Prescribed:  Torsemide  20 mg take 3 tablets (60 mg total) every morning and 2 tablets (40 mg total) by mouth every evening. Potassium 20 mEq take 1 tablet by mouth daily   Labs: 05/04/2024 Creatinine 2.34, BUN 32,, Potassium 4.0, Sodium 143, GFR 27 04/13/2024 Creatinine 2.37, BUN 42, Potassium 4.3, Sodium 140, GFR 27 01/31/2024 Creatinine 2.74, BUN 40, Potassium 4.5, Sodium 139 01/20/2024 Creatinine 2.94, BUN 50, Potassium 5.0, Sodium 146, GFR 20 A complete set of results can be found in Results Review.   Recommendations:  No changes and encouraged to call if experiencing any fluid symptoms.    Follow-up plan: ICM clinic phone appointment on 07/13/2024.  91 day device clinic remote transmission 06/25/2024.     EP/Cardiology Next Office Visit:  06/09/2024 with [name].   07/09/2024 with HF clinic.   Copy of ICM check sent to Dr. Inocencio.     3 month ICM trend: 06/01/2024.    12-14 Month ICM trend:     Chad GORMAN Garner, RN 06/04/2024 9:38 AM

## 2024-06-07 NOTE — Progress Notes (Unsigned)
  Electrophysiology Office Note:   Date:  06/07/2024  ID:  Chad Brown, DOB [birth year], MRN [id number]  Primary Cardiologist: [name], MD Primary Heart Failure: None Electrophysiologist: None  {Click to update primary MD,subspecialty MD or APP then REFRESH:1}    History of Present Illness:   Chad Brown is a 83 y.o. male with h/o CKD stage IV, sleep apnea, hyperlipidemia, papillary thyroid  carcinoma post thyroidectomy, chronic systolic heart failure, atrial fibrillation, valvular heart disease seen today for routine electrophysiology followup.   Since last being seen in our clinic the patient reports doing ***.  he denies chest pain, palpitations, dyspnea, PND, orthopnea, nausea, vomiting, dizziness, syncope, edema, weight gain, or early satiety.   Review of systems complete and found to be negative unless listed in HPI.      EP Information / Studies Reviewed:    {EKGtoday:28818}      ICD Interrogation-  reviewed in detail today,  See PACEART report.  Device History: Medtronic BiV ICD implanted 2016 generator change 09/16/2023 for chronic systolic heart failure History of appropriate therapy: No History of AAD therapy: Yes; currently on amiodarone    Risk Assessment/Calculations:    CHA2DS2-VASc Score = 4  {Confirm score is correct.  If not, click here to update score.  REFRESH note.  :1} This indicates a 4.8% annual risk of stroke. The patient's score is based upon: CHF History: 1 HTN History: 1 Diabetes History: 0 Stroke History: 0 Vascular Disease History: 0 Age Score: 2 Gender Score: 0   {This patient has a significant risk of stroke if diagnosed with atrial fibrillation.  Please consider VKA or DOAC agent for anticoagulation if the bleeding risk is acceptable.   You can also use the SmartPhrase .[email or web address] for documentation.   :[id number]}         Physical Exam:   VS:  There were no vitals taken for this visit.   Wt Readings from Last 3 Encounters:   05/13/24 206 lb (93.4 kg)  05/11/24 209 lb 6.4 oz (95 kg)  04/23/24 210 lb 9.6 oz (95.5 kg)     GEN: Well nourished, well developed in no acute distress NECK: No JVD; No carotid bruits CARDIAC: {EPRHYTHM:28826}, no murmurs, rubs, gallops RESPIRATORY:  Clear to auscultation without rales, wheezing or rhonchi  ABDOMEN: Soft, non-tender, non-distended EXTREMITIES:  No edema; No deformity   ASSESSMENT AND PLAN:    Chronic systolic dysfunction s/p Medtronic CRT-D  euvolemic today Stable on an appropriate medical regimen Normal ICD function See Pace Art report No changes today  2.  Persistent atrial fibrillation: On amiodarone .  At recent EP study, he was found to have electrically isolated pulmonary veins and posterior wall.  He continued to have episodes of atrial fibrillation.***  3.  Secondary hypercoagulable state: On Xarelto .  4.  Valvular heart disease post MTEER.  Functioning well on recent echo.  Plan per primary team.  Disposition:   Follow up with {EPPROVIDERS:28135::EP Team} {EPFOLLOW LE:71826}   Signed, Enya Bureau Gladis Norton, MD

## 2024-06-09 ENCOUNTER — Ambulatory Visit: Attending: Cardiology | Admitting: Cardiology

## 2024-06-09 ENCOUNTER — Encounter: Payer: Self-pay | Admitting: Cardiology

## 2024-06-09 VITALS — BP 85/60 | HR 82 | Ht 66.0 in | Wt 205.0 lb

## 2024-06-09 DIAGNOSIS — Z01812 Encounter for preprocedural laboratory examination: Secondary | ICD-10-CM

## 2024-06-09 DIAGNOSIS — I5022 Chronic systolic (congestive) heart failure: Secondary | ICD-10-CM

## 2024-06-09 DIAGNOSIS — I4819 Other persistent atrial fibrillation: Secondary | ICD-10-CM | POA: Diagnosis not present

## 2024-06-09 DIAGNOSIS — D6869 Other thrombophilia: Secondary | ICD-10-CM

## 2024-06-09 NOTE — Patient Instructions (Addendum)
 Medication Instructions:  Your physician recommends that you continue on your current medications as directed. Please refer to the Current Medication list given to you today.  *If you need a refill on your cardiac medications before your next appointment, please call your pharmacy*  Lab Work: Pre procedure labs - we will let you know when to get this done: BMET & CBC  If you have any lab test that is abnormal or we need to change your treatment, we will call you to review the results.  Testing/Procedures: Your physician has recommended that you have an ablation. Catheter ablation is a medical procedure used to treat some cardiac arrhythmias (irregular heartbeats). During catheter ablation, a long, thin, flexible tube is put into a blood vessel in your groin (upper thigh), or neck. This tube is called an ablation catheter. It is then guided to your heart through the blood vessel. Radio frequency waves destroy small areas of heart tissue where abnormal heartbeats may cause an arrhythmia to start.  You will be scheduled for 07/31/2024, arrive to [facility] at 1:00 pm.  The office will be in contact to arrange pre procedure blood work and will send instructions via mychart.  Follow-Up: At [facility] you and your health needs are our priority.  As part of our continuing mission to provide you with exceptional heart care, our providers are all part of one team.  This team includes your primary Cardiologist (physician) and Advanced Practice Providers or APPs (Physician Assistants and Nurse Practitioners) who all work together to provide you with the care you need, when you need it.  Your next appointment:   1 month(s)  Provider:   You may see Dr. Inocencio or one of the following Advanced Practice Providers on your designated Care Team:   [name], PA-C Michael Andy Tillery, PA-C Daphne Barrack, NP   Thank you for choosing [facility]!!   Maeola Domino, RN [phone]

## 2024-06-15 ENCOUNTER — Encounter (HOSPITAL_COMMUNITY)

## 2024-06-18 NOTE — Progress Notes (Signed)
 Remote ICD transmission.

## 2024-06-25 ENCOUNTER — Other Ambulatory Visit: Payer: Self-pay

## 2024-06-25 ENCOUNTER — Telehealth: Payer: Self-pay

## 2024-06-25 ENCOUNTER — Ambulatory Visit (INDEPENDENT_AMBULATORY_CARE_PROVIDER_SITE_OTHER)

## 2024-06-25 DIAGNOSIS — I5022 Chronic systolic (congestive) heart failure: Secondary | ICD-10-CM

## 2024-06-25 DIAGNOSIS — Z01812 Encounter for preprocedural laboratory examination: Secondary | ICD-10-CM

## 2024-06-25 DIAGNOSIS — I4819 Other persistent atrial fibrillation: Secondary | ICD-10-CM

## 2024-06-25 LAB — CUP PACEART REMOTE DEVICE CHECK
Battery Remaining Longevity: 97 mo
Battery Voltage: 2.97 V
Brady Statistic RV Percent Paced: 88.86 %
Date Time Interrogation Session: 20250925000013
HighPow Impedance: 69 Ohm
Lead Channel Impedance Value: 1045 Ohm
Lead Channel Impedance Value: 1102 Ohm
Lead Channel Impedance Value: 1159 Ohm
Lead Channel Impedance Value: 1330 Ohm
Lead Channel Impedance Value: 342 Ohm
Lead Channel Impedance Value: 380 Ohm
Lead Channel Impedance Value: 437 Ohm
Lead Channel Impedance Value: 456 Ohm
Lead Channel Impedance Value: 551 Ohm
Lead Channel Impedance Value: 665 Ohm
Lead Channel Impedance Value: 722 Ohm
Lead Channel Impedance Value: 836 Ohm
Lead Channel Impedance Value: 931 Ohm
Lead Channel Pacing Threshold Amplitude: 0.625 V
Lead Channel Pacing Threshold Amplitude: 0.625 V
Lead Channel Pacing Threshold Amplitude: 1.125 V
Lead Channel Pacing Threshold Pulse Width: 0.4 ms
Lead Channel Pacing Threshold Pulse Width: 0.4 ms
Lead Channel Pacing Threshold Pulse Width: 0.8 ms
Lead Channel Sensing Intrinsic Amplitude: 0.5 mV
Lead Channel Sensing Intrinsic Amplitude: 16.5 mV
Lead Channel Setting Pacing Amplitude: 1.5 V
Lead Channel Setting Pacing Amplitude: 1.5 V
Lead Channel Setting Pacing Amplitude: 2.25 V
Lead Channel Setting Pacing Pulse Width: 0.4 ms
Lead Channel Setting Pacing Pulse Width: 0.8 ms
Lead Channel Setting Sensing Sensitivity: 0.3 mV
Zone Setting Status: 755011
Zone Setting Status: 755011

## 2024-06-25 NOTE — Telephone Encounter (Signed)
Work up complete. 

## 2024-06-25 NOTE — Telephone Encounter (Signed)
-----   Message from Nurse Sherri P sent at 06/19/2024  8:07 AM EDT ----- Regarding: 10/31 AVN Ablation Precert:  MD: [name] Type of ablation: AV node Diagnosis: Persist afib CPT code: AV node (06349) Ablation scheduled (date/time): 10/31 3:00  Procedure:  Added to calendar? Yes Orders entered? Yes Letter complete? No, >30 days before procedure Scheduled with cath lab? Yes Any medications to hold? Yes (please list hold instructions): Hold Xarelto  2 days prior Labs ordered (CBC, BMET, PT/INR if on warfarin): Yes Mapping system: n/a CARTO/OPAL rep notified? N/a Cardiac CT needed? No Dye allergy? No Pre-meds ordered and instructions given? N/a Letter method: MyChart H&P: 9/9 Device: Yes, BiV ICD MDT  Follow-up:  Cassie/Angel, please schedule Routine.

## 2024-06-26 ENCOUNTER — Ambulatory Visit: Payer: Self-pay | Admitting: Cardiology

## 2024-06-26 ENCOUNTER — Other Ambulatory Visit (HOSPITAL_COMMUNITY): Payer: Self-pay | Admitting: Cardiology

## 2024-06-29 NOTE — Progress Notes (Signed)
 Remote ICD Transmission

## 2024-07-08 ENCOUNTER — Telehealth (HOSPITAL_COMMUNITY): Payer: Self-pay

## 2024-07-08 NOTE — Telephone Encounter (Signed)
 Called and spoke to pt's wife Inocente to confirm/remind patient of their appointment at [facility] on 07/09/24.   Appointment:   [x] Confirmed  [] Left mess   [] No answer/No voice mail  [] VM Full/unable to leave message  [] Phone not in service  Patient reminded to bring all medications and/or complete list.  Confirmed patient has transportation. Gave directions, instructed to utilize valet parking.

## 2024-07-09 ENCOUNTER — Ambulatory Visit (HOSPITAL_COMMUNITY)
Admission: RE | Admit: 2024-07-09 | Discharge: 2024-07-09 | Disposition: A | Discharge status: Home / Self Care | Source: Ambulatory Visit | Attending: Physician Assistant | Admitting: Physician Assistant

## 2024-07-09 ENCOUNTER — Encounter (HOSPITAL_COMMUNITY): Payer: Self-pay

## 2024-07-09 ENCOUNTER — Ambulatory Visit (HOSPITAL_BASED_OUTPATIENT_CLINIC_OR_DEPARTMENT_OTHER)
Admission: RE | Admit: 2024-07-09 | Discharge: 2024-07-09 | Disposition: A | Discharge status: Home / Self Care | Source: Ambulatory Visit | Attending: Physician Assistant | Admitting: Physician Assistant

## 2024-07-09 VITALS — BP 110/60 | HR 60 | Ht 66.0 in | Wt 206.4 lb

## 2024-07-09 DIAGNOSIS — Z9889 Other specified postprocedural states: Secondary | ICD-10-CM

## 2024-07-09 DIAGNOSIS — I342 Nonrheumatic mitral (valve) stenosis: Secondary | ICD-10-CM | POA: Insufficient documentation

## 2024-07-09 DIAGNOSIS — Z48812 Encounter for surgical aftercare following surgery on the circulatory system: Secondary | ICD-10-CM | POA: Diagnosis not present

## 2024-07-09 DIAGNOSIS — I428 Other cardiomyopathies: Secondary | ICD-10-CM

## 2024-07-09 DIAGNOSIS — Z79899 Other long term (current) drug therapy: Secondary | ICD-10-CM | POA: Insufficient documentation

## 2024-07-09 DIAGNOSIS — I5022 Chronic systolic (congestive) heart failure: Secondary | ICD-10-CM | POA: Insufficient documentation

## 2024-07-09 DIAGNOSIS — E785 Hyperlipidemia, unspecified: Secondary | ICD-10-CM | POA: Insufficient documentation

## 2024-07-09 DIAGNOSIS — Z7984 Long term (current) use of oral hypoglycemic drugs: Secondary | ICD-10-CM | POA: Insufficient documentation

## 2024-07-09 DIAGNOSIS — N184 Chronic kidney disease, stage 4 (severe): Secondary | ICD-10-CM

## 2024-07-09 DIAGNOSIS — G4733 Obstructive sleep apnea (adult) (pediatric): Secondary | ICD-10-CM | POA: Insufficient documentation

## 2024-07-09 DIAGNOSIS — Z95810 Presence of automatic (implantable) cardiac defibrillator: Secondary | ICD-10-CM | POA: Diagnosis not present

## 2024-07-09 DIAGNOSIS — I480 Paroxysmal atrial fibrillation: Secondary | ICD-10-CM

## 2024-07-09 DIAGNOSIS — I739 Peripheral vascular disease, unspecified: Secondary | ICD-10-CM | POA: Diagnosis not present

## 2024-07-09 DIAGNOSIS — Z7901 Long term (current) use of anticoagulants: Secondary | ICD-10-CM | POA: Insufficient documentation

## 2024-07-09 DIAGNOSIS — I340 Nonrheumatic mitral (valve) insufficiency: Secondary | ICD-10-CM

## 2024-07-09 DIAGNOSIS — Z954 Presence of other heart-valve replacement: Secondary | ICD-10-CM

## 2024-07-09 LAB — ECHOCARDIOGRAM COMPLETE
Area-P 1/2: 1.74 cm2
Calc EF: 60.2 %
MV VTI: 1.13 cm2
S' Lateral: 3.4 cm
Single Plane A2C EF: 64.9 %
Single Plane A4C EF: 47.2 %

## 2024-07-09 MED ORDER — POTASSIUM CHLORIDE CRYS ER 20 MEQ PO TBCR: 20.0000 meq | EXTENDED_RELEASE_TABLET | Freq: Every day | ORAL | 3 refills | Status: AC

## 2024-07-09 NOTE — Progress Notes (Addendum)
 ADVANCED HF CLINIC NOTE   Patient ID: Chad Brown, male   DOB: 1941/03/04, 83 y.o.   MRN: [id number] PCP: [name], MD HF Cardiologist: Rolan  Chief complaint: Heart failure follow-up  Chad Brown is an 83 y.o. with paroxysmal atrial fibrillation and chronic systolic CHF thought to be due to nonischemic cardiomyopathy presents for followup of CHF. He has a cardiac history dating back to 1998, when he was admitted with chest pain and had cardiac cath showing nonobstructive CAD. In 4/14, he was found to be in atrial fibrillation.  Echo showed EF 30-35%.  He had DCCV back to NSR.  He was back in atrial fibrillation in 1/16, and EF was low again on TEE at that time.   Again, he had DCCV.  In 5/16, cardiac MRI showed persistently low EF, so given LBBB, he had Medtronic CRT-D device placed.  He was back in atrial fibrillation in 6/16 and had TEE-guided DCCV again with EF now 15-20% on TEE.  Repeat echo in 2/17 showed EF 55-60%. Echo in 1/19 showed EF 45-50%, mild LV and RV dilation.   Echo 1/20 showed EF 45-50%, diffuse hypokinesis.   Atrial fibrillation ablation was done in 2/21.  Echo 2/21 showed EF 40-45%, PASP 50 mmHg, mild MR. Echo in 6/22 also showed EF 40-45%.   Patient was admitted in 1/24 after a syncopal episode.  He had gone to the gym and exercised and was standing talking to someone when he got lightheaded and passed out.  No arrhythmia on device interrogation.  He was hypotensive and orthostatic in the ER. Coreg  and Entresto  were stopped and Lasix  was decreased to 20 mg bid.    Spironolactone  was stopped due to hyperkalemia.   Echo 10/24 EF 40-45%, mild LV dilation, mildly decreased RV systolic function, moderate-severe MR with splay artifact and PISA ERO 0.44 cm^2, PASP 71 mmHg, dilated IVC.    Admitted 11/4-11/8/24 for scheduled RHC/TEE, he was admitted for severe MR in the setting of low output and volume overload requiring Mirinone and lasix  gtt. He later  underwent mTEER with clips x 2 08/21/23 by Dr. Wendel.   Atrial fibrillation ablation attempted in 5/25 but mapping showed electrical isolation of the walls and veins from prior ablation, no new ablation was done. Since that time, patient has had frequent atrial fibrillation. EP increased amiodarone  to 200 mg bid.   Seen for HF follow-up in 7/25. He was volume up and Torsemide  was increased. Amiodarone  reduced and low-dose toprol  XL added back. BiV pacing significantly reduced with persistent atrial fibrillation. Saw Dr. Inocencio in 9/25 and is planning to undergo AV nodal ablation.  Here today for 2 month CHF follow-up. Weight down 4 lb in clinic since last visit. Home log reviewed, down ~ 3 lb over the last week and 10 lb over the last year. Has chronic shortness of breath with exertion which has been stable. Gets winded easily when walking up an incline like his driveway. No orthopnea, PND or lower extremity edema. Back pain almost limits his mobility to some degree. Takes all meds as prescribed. No bleeding issues.    PMH: 1. Atrial fibrillation: Paroxysmal.  Diagnosed 4/14, DCCV 4/14 to NSR.  DCCV 1/16 to NSR.  DCCV 6/16 to NSR.  - Ablation 2/21 - Attempted ablation 5/25 but mapping showed electrical isolation of the walls and veins from prior ablation, no new ablation was done. 2. Chronic systolic CHF: Nonischemic cardiomyopathy.  LHC in 1998 with nonobstructive  disease.  Cardiolite  in 6/14 with no ischemia or infarction.  cMRI (5/16) with EF 34%, mildly dilated LV with diffuse HK worse in anterolateral wall, small punctate areas of LGE in anteroseptum and basal inferior wall (not CAD pattern).  TEE (6/16) with EF 15-20%.  He has Medtronic CRT-D device.  Echo (9/16) with EF 40%, moderate LV dilation, grade II diastolic dysfunction, normal RV size and systolic function, PASP 42 mmHg.  - Hypotension with Entresto .  - Echo (2/17) showed LV functional recovery, EF 55-60% with mild MR.    - Echo  (1/19): EF 45-50%, mild LV dilation, mild RV dilation - Echo (1/20): EF 45-50%, diffuse hypokinesis, moderate diastolic dysfunction, normal RV size and systolic function.  - Echo (2/21): EF 40-45%, PASP 50 mmHg, mild MR.  - Echo (6/22): EF 40-45%, moderate LV dilation, mild LVH, RV normal - Echo (10/24): EF 40-45%, mild LV dilation, mildly decreased RV systolic function, moderate-severe MR with splay artifact and PISA ERO 0.44 cm^2, PASP 71 mmHg, dilated IVC.   - Echo (11/24): EF 45-50%, gHK. RV function normal, mildly enlarged, RVSP 52.8, severely dilated LA and RA. Mild MR s/p mTEER.  - Echo (12/24): EF 50%, mild RV dysfunction, PASP 49.5 mmHg, s/p Mitraclip with mean gradient 4 and mild MR.  3. Hyperlipidemia: Myalgias with atorvastatin, Livalo, and pravastatin .  4. CKD stage 3 5. OA: s/p THR.  6. H/o melanoma 7. Anemia 8. Diverticulosis 9. OSA: Uses CPAP.  10. Pulmonary nodules: Followed by Dr. Katragadda.  11. Pancreatic pseudocysts 12. PAD: ABIs (2/20) were 0.94 on right and 1.03 on left.  13. Papillary thyroid  carcinoma: s/p thyroidectomy 8/22.  14. Mitral regurgitation: At lease moderately severe on 10/24 echo. mTEER 11/24, Mitraclip x 2.  - Echo 12/24 with mild MR s/p mTEER, mean gradient 4 mmHg across MV.   SH: Married with 3 children, lives in [place], retired, quit smoking in 2001.    FH: Mother with MI  ROS: All systems reviewed and negative except as per HPI.  Current Outpatient Medications  Medication Sig Dispense Refill   acetaminophen  (TYLENOL ) 500 MG tablet Take 1,000 mg by mouth 2 (two) times daily.     amiodarone  (PACERONE ) 200 MG tablet Take 1 tablet (200 mg total) by mouth daily.     Ascorbic Acid (VITAMIN C GUMMIE PO) Take 1 tablet by mouth in the morning.     calcium  carbonate (TUMS EX) 750 MG chewable tablet Chew 1 tablet by mouth daily.     cholecalciferol (VITAMIN D3) 25 MCG (1000 UNIT) tablet Take 1,000 Units by mouth in the morning.      dorzolamide -timolol  (COSOPT ) 22.3-6.8 MG/ML ophthalmic solution Place 1 drop into the right eye 2 (two) times daily.     escitalopram  (LEXAPRO ) 20 MG tablet Take 1 tablet (20 mg total) by mouth daily. 90 tablet 0   Evolocumab  (REPATHA  SURECLICK) 140 MG/ML SOAJ INJECT 140MG ( ) INTO SKIN EVERY 2 WEEKS 6 mL 3   Ferrous Sulfate  (IRON  PO) Take 9 mg by mouth in the morning. Gummie     finasteride  (PROSCAR ) 5 MG tablet Take 1 tablet (5 mg total) by mouth daily. 30 tablet 2   FOLIC ACID  PO Take 400 mcg by mouth in the morning.     gabapentin  (NEURONTIN ) 100 MG capsule Take 200 mg by mouth 2 (two) times daily.      JARDIANCE  10 MG TABS tablet TAKE ONE TABLET DAILY 90 tablet 0   latanoprost  (XALATAN ) 0.005 % ophthalmic solution Place  1 drop into the right eye daily at 12 noon.     levothyroxine  (SYNTHROID ) 137 MCG tablet TAKE ONE TABLET DAILY BEFORE BREAKFAST 90 tablet 1   metoprolol  succinate (TOPROL  XL) 25 MG 24 hr tablet Take 0.5 tablets (12.5 mg total) by mouth daily. 45 tablet 3   Polyethyl Glycol-Propyl Glycol 0.4-0.3 % SOLN Apply 1 drop to eye 3 (three) times daily as needed (dry/irritated eyes.). Systane     potassium chloride  SA (KLOR-CON  M) 20 MEQ tablet Take 1 tablet (20 mEq total) by mouth daily. 30 tablet 6   RHOPRESSA  0.02 % SOLN Place 1 drop into the right eye at bedtime.     tamsulosin  (FLOMAX ) 0.4 MG CAPS Take 0.4 mg by mouth at bedtime.      torsemide  (DEMADEX ) 20 MG tablet Take 3 tablets (60 mg total) by mouth in the morning AND 2 tablets (40 mg total) every evening. 180 tablet 3   XARELTO  15 MG TABS tablet TAKE ONE TABLET DAILY WITH SUPPER 90 tablet 1   No current facility-administered medications for this encounter.   Wt Readings from Last 3 Encounters:  07/09/24 93.6 kg (206 lb 6.4 oz)  06/09/24 93 kg (205 lb)  05/13/24 93.4 kg (206 lb)   BP 110/60   Pulse 60   Ht 5' 6 (1.676 m)   Wt 93.6 kg (206 lb 6.4 oz)   SpO2 98%   BMI 33.31 kg/m   Physical Exam: General:  Well  appearing elderly male Cor: JVP 6-7. Regular rate & rhythm. No murmurs. Lungs: clear Abdomen: soft, nontender, nondistended.  Extremities: no edema Neuro: alert & orientedx3. Affect pleasant  Medtronic Device Interrogation: activity 2.5 hr/day, no VT/VF, 99% effective BiV pacing since 9/24 (previously lower from Afib, less frequent Afib last 2 weeks), OptiVol low and thoracic impedance above threshold  Assessment/Plan: 1. Chronic systolic CHF: Suspect nonischemic cardiomyopathy.  EF 15-20% on 6/16 TEE.  Cardiac MRI in 5/16 showed a LGE pattern that was not suggestive of coronary disease (?myocarditis or infiltrative disease).  There may be a component of tachycardia-mediated cardiomyopathy.  However, the cardiac MRI appears to have been done when he was in NSR. He has Medtronic CRT-D device.  Echo (6/22) with EF 40-45%.  Echo (10/24) showed EF 40-45%, mild LV dilation, mildly decreased RV systolic function, moderate-severe MR with splay artifact and PISA ERO 0.44 cm^2, PASP 71 mmHg, dilated IVC. RHC 11/4 with significant mitral regurg, elevated filling pressures and low output, and severe mixed pulm venous and arterial hypertension. Mitral regurgitation lead to worsening CHF. Now s/p mTEER 11/24. Echo 12/24 post-mTEER showed EF 50%, LV with GHK, RV mildly reduced, LA mod dilated, s/p mitral clip placement, mild MR with mean gradient 4 mmHg. Patient has been more frequently in atrial fibrillation recently. On review of device interrogation, BiV pacing appears to drop when he is in AF.  NYHA class III. Assessment confounded by chronic back pain. - volume okay on exam and device - Continue Torsemide  60 q am/ 40 q pm - Continue Toprol  XL 12.5 mg at bedtime. - Off spironolactone  with hyperkalemia.  - Continue Jardiance  10 mg daily.  - He will stay off Entresto  with worsening renal function.  - Scr stable 2.3 on labs in August (~ 10 days after last diuretic adjustment) - Echo today pending 2. Atrial  fibrillation: Paroxysmal. Initial AF ablation in 2021, attempted repeat ablation in 5/25 but already with electrical isolation of walls and veins, no ablation done.  BiV pacing  percentage is lower in AF.  Quite a bit of AF early September, less burden over the last 2 weeks with improvement in BiV pacing. - EP planning AV nodal ablation at the end of this month. - Amiodarone  reduced back to 200 mg daily in 7/25.  LFTs normal recently, TSH okay in August. Free T4 mildly elevated. Endocrine following. - Continue Xarelto  15 mg daily.  3. Hyperlipidemia: Continue Repatha . LDL 57 in 7/25. 4. OSA: Encouraged compliance with CPAP.   5. PAD: Minimal claudication.  Continue Repatha .  6. CKD: Stage IV.  Follows with nephrology in [place].  - Continue SGLT2i. 7. Mitral regurgitation: s/p mTEER 11/24. Last echo in 12/24 with mild MR and mean gradient 4 mmHg s/p Mitraclip x 2.  - Echo today pending  Follow-up 4 months with Dr. Rolan Manuelita Dutch, PA-C 07/09/2024

## 2024-07-09 NOTE — Patient Instructions (Addendum)
 Good to see you today !.  No  medication changes   Your physician recommends that you schedule a follow-up appointment 4 months(February) Call office in December to  schedule an appointment  If you have any questions or concerns before your next appointment please send us  a message through [place] or call our office at [phone].    TO LEAVE A MESSAGE FOR THE NURSE SELECT OPTION 2, PLEASE LEAVE A MESSAGE INCLUDING: YOUR NAME DATE OF BIRTH CALL BACK NUMBER REASON FOR CALL**this is important as we prioritize the call backs  YOU WILL RECEIVE A CALL BACK THE SAME DAY AS LONG AS YOU CALL BEFORE 4:00 PM At the [facility], you and your health needs are our priority. As part of our continuing mission to provide you with exceptional heart care, we have created designated Provider Care Teams. These Care Teams include your primary Cardiologist (physician) and Advanced Practice Providers (APPs- Physician Assistants and Nurse Practitioners) who all work together to provide you with the care you need, when you need it.   You may see any of the following providers on your designated Care Team at your next follow up: Dr Toribio Fuel Dr [name] Dr. Ria Commander Dr. Morene Brownie Amy Lenetta, NP Caffie Shed, [place] [facility] [place], [place] Beckey Coe, NP [place] Lee, NP Ellouise Class, NP Tinnie Redman, PharmD Jaun Bash, PharmD   Please be sure to bring in all your medications bottles to every appointment.    Thank you for choosing [facility]

## 2024-07-09 NOTE — Progress Notes (Signed)
  Echocardiogram 2D Echocardiogram has been performed.  Norleen ORN [facility] 07/09/2024, 10:36 AM

## 2024-07-10 ENCOUNTER — Ambulatory Visit: Payer: Self-pay | Admitting: Physician Assistant

## 2024-07-10 ENCOUNTER — Telehealth (HOSPITAL_COMMUNITY): Payer: Self-pay

## 2024-07-10 ENCOUNTER — Other Ambulatory Visit (HOSPITAL_COMMUNITY): Payer: Self-pay

## 2024-07-10 ENCOUNTER — Encounter (HOSPITAL_COMMUNITY): Payer: Self-pay

## 2024-07-10 DIAGNOSIS — Z01812 Encounter for preprocedural laboratory examination: Secondary | ICD-10-CM

## 2024-07-10 DIAGNOSIS — I480 Paroxysmal atrial fibrillation: Secondary | ICD-10-CM

## 2024-07-10 NOTE — Telephone Encounter (Signed)
 Spoke with patient to complete pre-procedure call.     Health status review:  Any new medical conditions, recent signs of acute illness or been started on antibiotics? No Any recent hospitalizations or surgeries? No Any new medications started since pre-op visit? No  Follow all medication instructions prior to procedure or the procedure may be rescheduled:    HOLD Xarelto  for 2 days prior to your procedure. Last dose on October 28.  HOLD Jardiance  for 3 days prior to your procedure. Last dose on October 27.  On the morning of your procedure take ONLY levothyroxine  with a small sip of water. DO NOT TAKE any other medications.   Nothing to eat or drink after midnight prior to your procedure.  Pre-procedure testing scheduled: lab work by October 17.  Confirmed patient is scheduled forAV Node ablation on Friday, October 31 with Dr. Soyla Norton. Instructed patient to arrive at the Main Entrance A at [facility]: [address] [place], [place] [address] and check in at Admitting at 1:00 PM.  Advised of plan to go home the same day and will only stay overnight if medically necessary. You MUST have a responsible adult to drive you home and MUST be with you the first 24 hours after you arrive home or your procedure could be cancelled.  Informed patient a nurse will call a day before the procedure to confirm arrival time and ensure instructions are followed.  Patient verbalized understanding to information provided and is agreeable to proceed with procedure.   Advised patient to contact RN Navigator at [phone], to inform of any new medications started after call or concerns prior to procedure.

## 2024-07-10 NOTE — Addendum Note (Signed)
 Encounter addended by: Sebastian Lamarr SAUNDERS, PA-C on: 07/10/2024 8:28 AM  Actions taken: Flowsheet accepted

## 2024-07-13 ENCOUNTER — Ambulatory Visit: Attending: Cardiology

## 2024-07-13 DIAGNOSIS — I5022 Chronic systolic (congestive) heart failure: Secondary | ICD-10-CM

## 2024-07-13 DIAGNOSIS — Z95810 Presence of automatic (implantable) cardiac defibrillator: Secondary | ICD-10-CM | POA: Diagnosis not present

## 2024-07-16 ENCOUNTER — Other Ambulatory Visit (HOSPITAL_COMMUNITY)
Admission: RE | Admit: 2024-07-16 | Discharge: 2024-07-16 | Disposition: A | Discharge status: Home / Self Care | Source: Ambulatory Visit | Attending: Cardiology | Admitting: Cardiology

## 2024-07-16 ENCOUNTER — Other Ambulatory Visit (HOSPITAL_COMMUNITY)
Admission: RE | Admit: 2024-07-16 | Discharge: 2024-07-16 | Disposition: A | Discharge status: Home / Self Care | Source: Ambulatory Visit | Attending: Nephrology | Admitting: Nephrology

## 2024-07-16 DIAGNOSIS — I129 Hypertensive chronic kidney disease with stage 1 through stage 4 chronic kidney disease, or unspecified chronic kidney disease: Secondary | ICD-10-CM | POA: Insufficient documentation

## 2024-07-16 DIAGNOSIS — E1122 Type 2 diabetes mellitus with diabetic chronic kidney disease: Secondary | ICD-10-CM | POA: Insufficient documentation

## 2024-07-16 DIAGNOSIS — R809 Proteinuria, unspecified: Secondary | ICD-10-CM | POA: Diagnosis not present

## 2024-07-16 DIAGNOSIS — E211 Secondary hyperparathyroidism, not elsewhere classified: Secondary | ICD-10-CM | POA: Diagnosis not present

## 2024-07-16 DIAGNOSIS — D631 Anemia in chronic kidney disease: Secondary | ICD-10-CM | POA: Insufficient documentation

## 2024-07-16 DIAGNOSIS — Z01812 Encounter for preprocedural laboratory examination: Secondary | ICD-10-CM | POA: Insufficient documentation

## 2024-07-16 DIAGNOSIS — N189 Chronic kidney disease, unspecified: Secondary | ICD-10-CM | POA: Insufficient documentation

## 2024-07-16 DIAGNOSIS — I4819 Other persistent atrial fibrillation: Secondary | ICD-10-CM | POA: Insufficient documentation

## 2024-07-16 LAB — RENAL FUNCTION PANEL
Albumin: 4 g/dL (ref 3.5–5.0)
Anion gap: 9 (ref 5–15)
BUN: 38 mg/dL — ABNORMAL HIGH (ref 8–23)
CO2: 30 mmol/L (ref 22–32)
Calcium: 9.2 mg/dL (ref 8.9–10.3)
Chloride: 104 mmol/L (ref 98–111)
Creatinine, Ser: 2.67 mg/dL — ABNORMAL HIGH (ref 0.61–1.24)
GFR, Estimated: 23 mL/min — ABNORMAL LOW (ref >60)
Glucose, Bld: 115 mg/dL — ABNORMAL HIGH (ref 70–99)
Phosphorus: 3.7 mg/dL (ref 2.5–4.6)
Potassium: 5.3 mmol/L — ABNORMAL HIGH (ref 3.5–5.1)
Sodium: 143 mmol/L (ref 135–145)

## 2024-07-16 LAB — CBC WITH DIFFERENTIAL/PLATELET
Abs Immature Granulocytes: 0.01 K/uL (ref 0.00–0.07)
Basophils Absolute: 0.1 K/uL (ref 0.0–0.1)
Basophils Relative: 1 %
Eosinophils Absolute: 0.1 K/uL (ref 0.0–0.5)
Eosinophils Relative: 2 %
HCT: 40.1 % (ref 39.0–52.0)
Hemoglobin: 12.8 g/dL — ABNORMAL LOW (ref 13.0–17.0)
Immature Granulocytes: 0 %
Lymphocytes Relative: 14 %
Lymphs Abs: 0.8 K/uL (ref 0.7–4.0)
MCH: 32.4 pg (ref 26.0–34.0)
MCHC: 31.9 g/dL (ref 30.0–36.0)
MCV: 101.5 fL — ABNORMAL HIGH (ref 80.0–100.0)
Monocytes Absolute: 0.5 K/uL (ref 0.1–1.0)
Monocytes Relative: 8 %
Neutro Abs: 4.2 K/uL (ref 1.7–7.7)
Neutrophils Relative %: 75 %
Platelets: 150 K/uL (ref 150–400)
RBC: 3.95 MIL/uL — ABNORMAL LOW (ref 4.22–5.81)
RDW: 15.8 % — ABNORMAL HIGH (ref 11.5–15.5)
WBC: 5.7 K/uL (ref 4.0–10.5)
nRBC: 0 % (ref 0.0–0.2)

## 2024-07-16 LAB — BASIC METABOLIC PANEL WITH GFR
Anion gap: 9 (ref 5–15)
BUN: 38 mg/dL — ABNORMAL HIGH (ref 8–23)
CO2: 30 mmol/L (ref 22–32)
Calcium: 8.9 mg/dL (ref 8.9–10.3)
Chloride: 104 mmol/L (ref 98–111)
Creatinine, Ser: 2.64 mg/dL — ABNORMAL HIGH (ref 0.61–1.24)
GFR, Estimated: 23 mL/min — ABNORMAL LOW (ref >60)
Glucose, Bld: 112 mg/dL — ABNORMAL HIGH (ref 70–99)
Potassium: 5.4 mmol/L — ABNORMAL HIGH (ref 3.5–5.1)
Sodium: 142 mmol/L (ref 135–145)

## 2024-07-17 ENCOUNTER — Telehealth: Payer: Self-pay

## 2024-07-17 LAB — MICROALBUMIN / CREATININE URINE RATIO
Creatinine, Urine: 85.3 mg/dL
Microalb Creat Ratio: 5 mg/g{creat} (ref 0–29)
Microalb, Ur: 3.9 ug/mL — ABNORMAL HIGH

## 2024-07-17 NOTE — Progress Notes (Signed)
 EPIC Encounter for ICM Monitoring  Patient Name: Chad Brown is a 83 y.o. male Date: 07/17/2024 Primary Care Physican: [name], MD Primary Cardiologist: Nishan/McLean Electrophysiologist: Inocencio Pore Pacing: 99.9 %  10/23/2023 Weight: 198 lbs 11/27/2023 Weight: 200 lbs 02/03/2024 Weight: 203 lbs 01/30/2024 Weight: 206 lbs 03/09/2024 Weight: 206 lbs 03/10/2024 Weight: 204 lbs 04/23/2024 Office Weight: 210 lbs 05/01/2024 Home Weight: 203-206 lbs 07/09/2024 Office Weight: 206.6 lbs    Since 09-Jul-2024 Time in AT/AF  0.0 hours/day (0.0 %) (followed afib clinic)   Attempted call to patient and unable to reach.  Left detailed message per DPR regarding transmission.  Transmission results reviewed.   Scheduled 07/31/2024 for AV nodal ablation.   Since 06/02/2024 ICM Remote Transmission: Optivol thoracic impedance suggesting normal fluid levels within the last month.    Prescribed:  Torsemide  20 mg take 3 tablets (60 mg total) every morning and 2 tablets (40 mg total) by mouth every evening. Potassium 20 mEq take 1 tablet by mouth daily   Labs: 07/16/2024 Creatinine 2.64, BUN 38, Potassium 5.4, Sodium 142, GFR 23 05/04/2024 Creatinine 2.34, BUN 32,, Potassium 4.0, Sodium 143, GFR 27 04/13/2024 Creatinine 2.37, BUN 42, Potassium 4.3, Sodium 140, GFR 27 01/31/2024 Creatinine 2.74, BUN 40, Potassium 4.5, Sodium 139 01/20/2024 Creatinine 2.94, BUN 50, Potassium 5.0, Sodium 146, GFR 20 A complete set of results can be found in Results Review.   Recommendations:  Left voice mail with ICM number and encouraged to call if experiencing any fluid symptoms.   Follow-up plan: ICM clinic phone appointment on 08/17/2024.  91 day device clinic remote transmission 09/25/2024.     EP/Cardiology Next Office Visit:  08/21/2024 with [name].  Recall 11/06/2024 with Dr Rolan.   Copy of ICM check sent to Dr. Inocencio.     Remote monitoring is medically necessary for Heart Failure Management.     Daily Thoracic Impedance ICM trend: 04/13/2024 through 07/13/2024.    12-14 Month Thoracic Impedance ICM trend:     [name], RN 07/17/2024 10:47 AM

## 2024-07-17 NOTE — Telephone Encounter (Signed)
 Remote ICM transmission received.  Attempted call to patient regarding ICM remote transmission.  Left detailed message per DPR with ICM phone number to return call for any questions, concerns or fluid symptoms.

## 2024-07-18 ENCOUNTER — Other Ambulatory Visit: Payer: Self-pay | Admitting: "Endocrinology

## 2024-07-18 DIAGNOSIS — C73 Malignant neoplasm of thyroid gland: Secondary | ICD-10-CM

## 2024-07-22 ENCOUNTER — Ambulatory Visit: Payer: Self-pay | Admitting: Cardiology

## 2024-07-23 NOTE — Telephone Encounter (Signed)
 Reviewed patient's lab results and noted provider feedback. Contacted patient and informed him of provider recommendations. He verbalized understanding and stated he will get lab work completed at [facility] while out at another appointment on next week. A current BMP order is still active. Will also email order requisition to patient.

## 2024-07-29 ENCOUNTER — Other Ambulatory Visit (HOSPITAL_COMMUNITY)
Admission: RE | Admit: 2024-07-29 | Discharge: 2024-07-29 | Disposition: A | Discharge status: Home / Self Care | Source: Ambulatory Visit | Attending: Cardiology | Admitting: Cardiology

## 2024-07-29 DIAGNOSIS — I5042 Chronic combined systolic (congestive) and diastolic (congestive) heart failure: Secondary | ICD-10-CM | POA: Diagnosis not present

## 2024-07-29 DIAGNOSIS — D638 Anemia in other chronic diseases classified elsewhere: Secondary | ICD-10-CM | POA: Diagnosis not present

## 2024-07-29 DIAGNOSIS — I4819 Other persistent atrial fibrillation: Secondary | ICD-10-CM | POA: Insufficient documentation

## 2024-07-29 DIAGNOSIS — N2581 Secondary hyperparathyroidism of renal origin: Secondary | ICD-10-CM | POA: Diagnosis not present

## 2024-07-29 DIAGNOSIS — N184 Chronic kidney disease, stage 4 (severe): Secondary | ICD-10-CM | POA: Diagnosis not present

## 2024-07-29 LAB — BASIC METABOLIC PANEL WITH GFR
Anion gap: 10 (ref 5–15)
BUN: 42 mg/dL — ABNORMAL HIGH (ref 8–23)
CO2: 31 mmol/L (ref 22–32)
Calcium: 8.2 mg/dL — ABNORMAL LOW (ref 8.9–10.3)
Chloride: 104 mmol/L (ref 98–111)
Creatinine, Ser: 2.87 mg/dL — ABNORMAL HIGH (ref 0.61–1.24)
GFR, Estimated: 21 mL/min — ABNORMAL LOW (ref >60)
Glucose, Bld: 113 mg/dL — ABNORMAL HIGH (ref 70–99)
Potassium: 4.2 mmol/L (ref 3.5–5.1)
Sodium: 145 mmol/L (ref 135–145)

## 2024-07-29 LAB — CBC
HCT: 38.1 % — ABNORMAL LOW (ref 39.0–52.0)
Hemoglobin: 12.1 g/dL — ABNORMAL LOW (ref 13.0–17.0)
MCH: 31.9 pg (ref 26.0–34.0)
MCHC: 31.8 g/dL (ref 30.0–36.0)
MCV: 100.5 fL — ABNORMAL HIGH (ref 80.0–100.0)
Platelets: 140 K/uL — ABNORMAL LOW (ref 150–400)
RBC: 3.79 MIL/uL — ABNORMAL LOW (ref 4.22–5.81)
RDW: 15.6 % — ABNORMAL HIGH (ref 11.5–15.5)
WBC: 6.5 K/uL (ref 4.0–10.5)
nRBC: 0 % (ref 0.0–0.2)

## 2024-07-30 NOTE — Pre-Procedure Instructions (Signed)
 Attempted to call patient regarding procedure instructions.  Left voicemail on the following items: Arrival time 1130- new arrival time Nothing to eat or drink after midnight No meds AM of procedure Responsible person to drive you home and stay with you for 24 hrs  Have you missed any doses of anti-coagulant Xarelto - last dose 10/28

## 2024-07-31 ENCOUNTER — Encounter (HOSPITAL_COMMUNITY)
Admission: RE | Disposition: A | Discharge status: Home / Self Care | Payer: Self-pay | Source: Home / Self Care | Attending: Cardiology

## 2024-07-31 ENCOUNTER — Encounter (HOSPITAL_COMMUNITY): Payer: Self-pay | Admitting: Certified Registered Nurse Anesthetist

## 2024-07-31 ENCOUNTER — Other Ambulatory Visit: Payer: Self-pay

## 2024-07-31 ENCOUNTER — Ambulatory Visit (HOSPITAL_COMMUNITY)
Admission: RE | Admit: 2024-07-31 | Discharge: 2024-07-31 | Disposition: A | Discharge status: Home / Self Care | Attending: Cardiology | Admitting: Cardiology

## 2024-07-31 DIAGNOSIS — Z85850 Personal history of malignant neoplasm of thyroid: Secondary | ICD-10-CM | POA: Insufficient documentation

## 2024-07-31 DIAGNOSIS — I4819 Other persistent atrial fibrillation: Secondary | ICD-10-CM | POA: Diagnosis not present

## 2024-07-31 DIAGNOSIS — I5022 Chronic systolic (congestive) heart failure: Secondary | ICD-10-CM | POA: Diagnosis not present

## 2024-07-31 DIAGNOSIS — I4892 Unspecified atrial flutter: Secondary | ICD-10-CM | POA: Diagnosis not present

## 2024-07-31 DIAGNOSIS — N184 Chronic kidney disease, stage 4 (severe): Secondary | ICD-10-CM | POA: Diagnosis not present

## 2024-07-31 DIAGNOSIS — Z95810 Presence of automatic (implantable) cardiac defibrillator: Secondary | ICD-10-CM | POA: Insufficient documentation

## 2024-07-31 DIAGNOSIS — Z79899 Other long term (current) drug therapy: Secondary | ICD-10-CM | POA: Diagnosis not present

## 2024-07-31 DIAGNOSIS — I4891 Unspecified atrial fibrillation: Secondary | ICD-10-CM

## 2024-07-31 DIAGNOSIS — E785 Hyperlipidemia, unspecified: Secondary | ICD-10-CM | POA: Insufficient documentation

## 2024-07-31 DIAGNOSIS — G4730 Sleep apnea, unspecified: Secondary | ICD-10-CM | POA: Insufficient documentation

## 2024-07-31 HISTORY — PX: AV NODE ABLATION: EP1193

## 2024-07-31 SURGERY — AV NODE ABLATION
Anesthesia: General

## 2024-07-31 MED ORDER — LIDOCAINE HCL 1 % IJ SOLN
INTRAMUSCULAR | Status: AC
  Filled 2024-07-31: qty 40

## 2024-07-31 MED ORDER — SODIUM CHLORIDE 0.9% FLUSH: 3.0000 mL | INTRAVENOUS | Status: DC | PRN

## 2024-07-31 MED ORDER — FENTANYL CITRATE (PF) 100 MCG/2ML IJ SOLN
INTRAMUSCULAR | Status: DC | PRN
  Administered 2024-07-31: 25 ug via INTRAVENOUS

## 2024-07-31 MED ORDER — HEPARIN (PORCINE) IN NACL 1000-0.9 UT/500ML-% IV SOLN
INTRAVENOUS | Status: DC | PRN
  Administered 2024-07-31: 500 mL

## 2024-07-31 MED ORDER — SODIUM CHLORIDE 0.9 % IV SOLN: 250.0000 mL | INTRAVENOUS | Status: DC | PRN

## 2024-07-31 MED ORDER — SODIUM CHLORIDE 0.9 % IV SOLN: INTRAVENOUS | Status: DC

## 2024-07-31 MED ORDER — CEFAZOLIN SODIUM-DEXTROSE 2-4 GM/100ML-% IV SOLN: 2.0000 g | Freq: Once | INTRAVENOUS | Status: AC

## 2024-07-31 MED ORDER — LIDOCAINE HCL (PF) 1 % IJ SOLN
INTRAMUSCULAR | Status: DC | PRN
Start: 2024-07-31 — End: 2024-07-31
  Administered 2024-07-31: 60 mL

## 2024-07-31 MED ORDER — CEFAZOLIN SODIUM-DEXTROSE 2-4 GM/100ML-% IV SOLN
INTRAVENOUS | Status: AC
  Administered 2024-07-31: 2 g via INTRAVENOUS
  Filled 2024-07-31: qty 100

## 2024-07-31 MED ORDER — ACETAMINOPHEN 325 MG PO TABS: 650.0000 mg | ORAL_TABLET | ORAL | Status: DC | PRN

## 2024-07-31 MED ORDER — MIDAZOLAM HCL 5 MG/5ML IJ SOLN
INTRAMUSCULAR | Status: DC | PRN
  Administered 2024-07-31: 1 mg via INTRAVENOUS

## 2024-07-31 MED ORDER — ONDANSETRON HCL 4 MG/2ML IJ SOLN: 4.0000 mg | Freq: Four times a day (QID) | INTRAMUSCULAR | Status: DC | PRN

## 2024-07-31 MED ORDER — CEFAZOLIN SODIUM-DEXTROSE 2-3 GM-%(50ML) IV SOLR: 2.0000 g | Freq: Once | INTRAVENOUS | Status: DC

## 2024-07-31 MED ORDER — FENTANYL CITRATE (PF) 100 MCG/2ML IJ SOLN
INTRAMUSCULAR | Status: AC
  Filled 2024-07-31: qty 2

## 2024-07-31 MED ORDER — MIDAZOLAM HCL 2 MG/2ML IJ SOLN
INTRAMUSCULAR | Status: AC
  Filled 2024-07-31: qty 2

## 2024-07-31 SURGICAL SUPPLY — 7 items
CATH BLAZERPRIME XP LG CV 10MM (ABLATOR) IMPLANT
CATH JOSEPH QUAD ALLRED 6F REP (CATHETERS) IMPLANT
CLOSURE MYNX CONTROL 6F/7F (Vascular Products) IMPLANT
PACK EP LF (CUSTOM PROCEDURE TRAY) ×1 IMPLANT
PAD DEFIB RADIO PHYSIO CONN (PAD) ×1 IMPLANT
SHEATH PINNACLE 8F 10CM (SHEATH) IMPLANT
SHEATH PROBE COVER 6X72 (BAG) IMPLANT

## 2024-07-31 NOTE — Discharge Instructions (Signed)

## 2024-07-31 NOTE — H&P (Signed)
  Electrophysiology Office Note:   Date:  07/31/2024  ID:  Chad Brown, DOB [birth year], MRN [id number]  Primary Cardiologist: Kaan Tosh Gladis Norton, MD Primary Heart Failure: [name], MD Electrophysiologist: None      History of Present Illness:   Chad Brown is a 83 y.o. male with h/o CKD stage IV, sleep apnea, hyperlipidemia, papillary thyroid  carcinoma post thyroidectomy, chronic systolic heart failure, atrial fibrillation, valvular heart disease seen today for routine electrophysiology followup.   Today, denies symptoms of palpitations, chest pain, dyspnea, orthopnea, PND, lower extremity edema, claudication, dizziness, presyncope, syncope, bleeding, or neurologic sequela. The patient is tolerating medications without difficulties. Plan AVN ablation today.      EP Information / Studies Reviewed:    EKG is not ordered today. EKG from 05/13/24 reviewed which showed AV paced      ICD Interrogation-  reviewed in detail today,  See PACEART report.  Device History: Medtronic BiV ICD implanted 2016 generator change 09/16/2023 for chronic systolic heart failure History of appropriate therapy: No History of AAD therapy: Yes; currently on amiodarone    Risk Assessment/Calculations:    CHA2DS2-VASc Score =     This indicates a  % annual risk of stroke. The patient's score is based upon:              Physical Exam:   VS:  There were no vitals taken for this visit.   Wt Readings from Last 3 Encounters:  07/09/24 93.6 kg  06/09/24 93 kg  05/13/24 93.4 kg    GEN: Well nourished, well developed in no acute distress NECK: No JVD; No carotid bruits CARDIAC: Regular rate and rhythm, no murmurs, rubs, gallops RESPIRATORY:  Clear to auscultation without rales, wheezing or rhonchi  ABDOMEN: Soft, non-tender, non-distended EXTREMITIES:  No edema; No deformity .p  ASSESSMENT AND PLAN:    Chronic systolic dysfunction s/p Medtronic CRT-D  euvolemic today Stable on an  appropriate medical regimen Normal ICD function See Pace Art report No changes today  2.  Persistent atrial fibrillation: Chad Brown has presented today for surgery, with the diagnosis of atrial fibillation.  The various methods of treatment have been discussed with the patient and family. After consideration of risks, benefits and other options for treatment, the patient has consented to  Procedure(s): Catheter ablation of the AV node as a surgical intervention .  Risks include but not limited to complete heart block, stroke, esophageal damage, nerve damage, bleeding, vascular damage, tamponade, perforation, MI, and death. The patient's history has been reviewed, patient examined, no change in status, stable for surgery.  I have reviewed the patient's chart and labs.  Questions were answered to the patient's satisfaction.    Talisha Erby Norton, MD 07/31/2024 12:00 PM

## 2024-07-31 NOTE — Progress Notes (Signed)
 Patient and patient wife given discharge instructions, education provided no further questions at this time. Patient able to ambulate and void before discharge. Able to tolerate PO intake. Patient site is clean, dry, intact with no hematoma noted upon discharge. Verified with MD [name] to restart Xerlto tonight 10/31 and to stop potassium.

## 2024-08-01 ENCOUNTER — Encounter (HOSPITAL_COMMUNITY): Payer: Self-pay | Admitting: Cardiology

## 2024-08-03 ENCOUNTER — Telehealth (HOSPITAL_COMMUNITY): Payer: Self-pay

## 2024-08-03 MED FILL — Midazolam HCl Inj PF 2 MG/2ML (Base Equivalent): INTRAMUSCULAR | Qty: 2 | Status: AC

## 2024-08-03 NOTE — Telephone Encounter (Signed)
 Spoke with patient to complete post procedure follow up call.  Patient reports no complications with groin sites.   Instructions reviewed with patient:  It is normal to have bruising, tenderness, mild swelling, and a pea or marble sized lump/knot at the groin site which can take up to three months to resolve.  Get help right away if you notice sudden swelling at the puncture site.  Check your puncture site every day for signs of infection: fever, redness, swelling, pus drainage, warmth, foul odor or excessive pain. If this occurs, please call [phone], to speak with the RN Navigator. Get help right away if your puncture site is bleeding and the bleeding does not stop after applying firm pressure to the area.  You may continue to have skipped beats during the first several months after your procedure.  It is very important not to miss any doses of your blood thinner Xarelto . You will follow up with the Dr. Inocencio 4 weeks after your procedure.  Activity restrictions reviewed.  Patient verbalized understanding to all instructions provided.

## 2024-08-12 DIAGNOSIS — M545 Low back pain, unspecified: Secondary | ICD-10-CM | POA: Diagnosis not present

## 2024-08-13 ENCOUNTER — Other Ambulatory Visit (HOSPITAL_COMMUNITY)

## 2024-08-13 ENCOUNTER — Ambulatory Visit: Admitting: Physician Assistant

## 2024-08-14 ENCOUNTER — Other Ambulatory Visit (HOSPITAL_COMMUNITY): Payer: Medicare PPO

## 2024-08-14 ENCOUNTER — Ambulatory Visit: Payer: Medicare PPO

## 2024-08-19 ENCOUNTER — Ambulatory Visit: Attending: Cardiology

## 2024-08-19 DIAGNOSIS — Z95810 Presence of automatic (implantable) cardiac defibrillator: Secondary | ICD-10-CM

## 2024-08-19 DIAGNOSIS — I5022 Chronic systolic (congestive) heart failure: Secondary | ICD-10-CM | POA: Diagnosis not present

## 2024-08-19 NOTE — Progress Notes (Signed)
 EPIC Encounter for ICM Monitoring  Patient Name: Chad Brown is a 83 y.o. male Date: 08/19/2024 Primary Care Physican: [name], MD Primary Cardiologist: Nishan/McLean Electrophysiologist: Inocencio Pore Pacing: 99.9 %  10/23/2023 Weight: 198 lbs 11/27/2023 Weight: 200 lbs 02/03/2024 Weight: 203 lbs 01/30/2024 Weight: 206 lbs 03/09/2024 Weight: 206 lbs 03/10/2024 Weight: 204 lbs 04/23/2024 Office Weight: 210 lbs 05/01/2024 Home Weight: 203-206 lbs 07/09/2024 Office Weight: 206.6 lbs 08/19/2024 Weight: 204 lbs (was 207 lbs)    Since 01-Aug-2024  Ablation was completed on 07/31/2024 Time in AT/AF  14.7 hours/day (61.4 %) (followed afib clinic)     Spoke with patient and heart failure questions reviewed.  Transmission results reviewed.  Pt s weight up 3-4 pounds and closer to baseline today.  He has been eating a lot of homemade soup which normally has less sodium but he eats a lot of crackers with the soup which are usually high in salt.    Since 06/02/2024 ICM Remote Transmission: Optivol thoracic impedance suggesting possible fluid accumulation starting 07/26/2024 but trending back toward baseline.     Prescribed:  Torsemide  20 mg take 3 tablets (60 mg total) every morning and 2 tablets (40 mg total) by mouth every evening. Potassium 20 mEq take 1 tablet by mouth daily   Labs: 07/16/2024 Creatinine 2.64, BUN 38, Potassium 5.4, Sodium 142, GFR 23 05/04/2024 Creatinine 2.34, BUN 32,, Potassium 4.0, Sodium 143, GFR 27 04/13/2024 Creatinine 2.37, BUN 42, Potassium 4.3, Sodium 140, GFR 27 01/31/2024 Creatinine 2.74, BUN 40, Potassium 4.5, Sodium 139 01/20/2024 Creatinine 2.94, BUN 50, Potassium 5.0, Sodium 146, GFR 20 A complete set of results can be found in Results Review.   Recommendations:  Advised to decrease salt intake.  Explained Dr Inocencio will check the device 08/21/2024 and provide any recommendations if fluid levels are not back to normal.     Follow-up plan: ICM clinic  phone appointment on 09/28/2024.  91 day device clinic remote transmission 09/25/2024.     EP/Cardiology Next Office Visit:  09/28/2024 with [name].  Recall 11/06/2024 with Dr Rolan.   Copy of ICM check sent to Dr. Inocencio.     Remote monitoring is medically necessary for Heart Failure Management.    Daily Thoracic Impedance ICM trend: 05/17/2024 through 08/16/2024.    12-14 Month Thoracic Impedance ICM trend:     [name], RN 08/19/2024 3:53 PM

## 2024-08-20 ENCOUNTER — Other Ambulatory Visit: Payer: Self-pay

## 2024-08-20 ENCOUNTER — Encounter: Payer: Self-pay | Admitting: Physical Therapy

## 2024-08-20 ENCOUNTER — Ambulatory Visit: Admitting: Physical Therapy

## 2024-08-20 DIAGNOSIS — R293 Abnormal posture: Secondary | ICD-10-CM | POA: Diagnosis not present

## 2024-08-20 DIAGNOSIS — M62830 Muscle spasm of back: Secondary | ICD-10-CM | POA: Diagnosis not present

## 2024-08-20 DIAGNOSIS — M5459 Other low back pain: Secondary | ICD-10-CM | POA: Diagnosis not present

## 2024-08-20 NOTE — Therapy (Signed)
 OUTPATIENT PHYSICAL THERAPY THORACOLUMBAR EVALUATION   Patient Name: Chad Brown MRN: [id number] DOB:08-31-1941, 83 y.o., male Today's Date: 08/20/2024  END OF SESSION:  PT End of Session - 08/20/24 1714     Visit Number 1    Number of Visits 12    Date for Recertification  10/01/24    PT Start Time 0145    PT Stop Time 0235    PT Time Calculation (min) 50 min    Activity Tolerance Patient tolerated treatment well    Behavior During Therapy [facility] for tasks assessed/performed          Past Medical History:  Diagnosis Date   Adenomatous colon polyp    tubular   AICD (automatic cardioverter/defibrillator) present 03/08/2015   MDT CRTD dual pacemaker and defib   Anemia    iron  deficient   Arthritis    about all my joints; hands, knees, back (03/08/2015)   Atherosclerosis    Cataract    left eye small   Cholelithiasis    gallstones   Chronic systolic CHF (congestive heart failure) (HCC)    a. New dx 12/2012 ? NICM, may be r/t afib. b. Nuc 03/2013 - normal;  c. 03/2015 TEE EF 15-20%.   Colon polyp, hyperplastic 01/2015   removed precancerous lesions   Depression    Diverticulosis    Dysrhythmia    afib   GERD (gastroesophageal reflux disease)    Glaucoma    right eye   Hyperlipidemia    Hypertension    Melanoma of eye (HCC) 2000's   right; it's never been biopsied   Melanoma of lower leg (HCC) 2015   left; right at my knee   Myocardial infarction (HCC) 1998   OSA (obstructive sleep apnea) 01/04/2016   no longer tolerates cpap   Peripheral vision loss, right 2006   Persistent atrial fibrillation (HCC)    a. Dx 12/2012, s/p TEE/DCCV 01/26/13. b. On Xarelto  (CHA2DS2VASc = 3);  c. 03/2015 TEE (EF 15-20%, no LAA thrombus) and DCCV - amio increased to 200 mg bid.   Pneumonia    S/P mitral valve clip implantation 08/21/2023   Placement of 1 XTW in the A2/P2 position and 1 NTW in the A1/P1 position by Dr. Wendel and Dr. Wonda   Urinary hesitancy due to benign  prostatic hypertrophy    Past Surgical History:  Procedure Laterality Date   ATRIAL FIBRILLATION ABLATION N/A 11/03/2019   Procedure: ATRIAL FIBRILLATION ABLATION;  Surgeon: Kelsie Agent, MD;  Location: [facility] INVASIVE CV LAB;  Service: Cardiovascular;  Laterality: N/A;   ATRIAL FIBRILLATION ABLATION N/A 02/13/2024   Procedure: ATRIAL FIBRILLATION ABLATION;  Surgeon: Inocencio Soyla Lunger, MD;  Location: [facility] INVASIVE CV LAB;  Service: Cardiovascular;  Laterality: N/A;   AV NODE ABLATION N/A 07/31/2024   Procedure: AV NODE ABLATION;  Surgeon: Inocencio Soyla Lunger, MD;  Location: [facility] INVASIVE CV LAB;  Service: Cardiovascular;  Laterality: N/A;   BACK SURGERY     upper back, cannot turn neck well   BIV ICD GENERATOR CHANGEOUT N/A 09/16/2023   Procedure: BIV ICD GENERATOR CHANGEOUT;  Surgeon: Inocencio Soyla Lunger, MD;  Location: [facility] INVASIVE CV LAB;  Service: Cardiovascular;  Laterality: N/A;   CARDIAC CATHETERIZATION  1998   CARDIOVERSION N/A 01/26/2013   Procedure: CARDIOVERSION;  Surgeon: Redell GORMAN Shallow, MD;  Location: [facility] ENDOSCOPY;  Service: Cardiovascular;  Laterality: N/A;   CARDIOVERSION N/A 03/23/2015   Procedure: CARDIOVERSION;  Surgeon: Oneil JAYSON Parchment, MD;  Location: [facility] ENDOSCOPY;  Service:  Cardiovascular;  Laterality: N/A;   CARDIOVERSION N/A 08/14/2017   Procedure: CARDIOVERSION;  Surgeon: Delford Maude BROCKS, MD;  Location: [facility] ENDOSCOPY;  Service: Cardiovascular;  Laterality: N/A;   CATARACT EXTRACTION Right ~ 2006   COLONOSCOPY WITH PROPOFOL  N/A 02/10/2015   Procedure: COLONOSCOPY WITH PROPOFOL ;  Surgeon: Lupita FORBES Commander, MD;  Location: [facility] ENDOSCOPY;  Service: Endoscopy;  Laterality: N/A;   COLONOSCOPY WITH PROPOFOL  N/A 08/07/2016   Procedure: COLONOSCOPY WITH PROPOFOL ;  Surgeon: Lupita FORBES Commander, MD;  Location: [facility] ENDOSCOPY;  Service: Endoscopy;  Laterality: N/A;   ENTEROSCOPY N/A 08/17/2015   Procedure: ENTEROSCOPY;  Surgeon: Lupita FORBES Commander, MD;  Location: [facility] ENDOSCOPY;  Service: Endoscopy;   Laterality: N/A;   EP IMPLANTABLE DEVICE N/A 03/08/2015   MDT Fraser Hawthorne CRT-D for nonischemic CM by Dr Kelsie for primary prevention   GLAUCOMA SURGERY Right ~ 2006   put 3 stents in to drain fluid (03/08/2015) not successful, sent to [facility] to try to get last stent out   HOT HEMOSTASIS N/A 08/07/2016   Procedure: HOT HEMOSTASIS (ARGON PLASMA COAGULATION/BICAP);  Surgeon: Lupita FORBES Commander, MD;  Location: [facility];  Service: Endoscopy;  Laterality: N/A;   INCISION AND DRAINAGE ABSCESS POSTERIOR CERVICALSPINE  05/2012   JOINT REPLACEMENT     MELANOMA EXCISION Left 2015   lower leg; right at my knee   REFRACTIVE SURGERY Right ~ 2006 X 2   twice; both done at [facility] (03/08/2015   RIGHT HEART CATH N/A 08/05/2023   Procedure: RIGHT HEART CATH;  Surgeon: Rolan Ezra RAMAN, MD;  Location: [facility] INVASIVE CV LAB;  Service: Cardiovascular;  Laterality: N/A;   SURGERY SCROTAL / TESTICULAR Right 1990's   TEE WITHOUT CARDIOVERSION N/A 01/26/2013   Procedure: TRANSESOPHAGEAL ECHOCARDIOGRAM (TEE);  Surgeon: Redell RAMAN Shallow, MD;  Location: [facility] ENDOSCOPY;  Service: Cardiovascular;  Laterality: N/A;  [name] anes. /    TEE WITHOUT CARDIOVERSION N/A 10/05/2014   Procedure: TRANSESOPHAGEAL ECHOCARDIOGRAM (TEE)  with cardioversion;  Surgeon: Aleene JINNY Passe, MD;  Location: [facility] ENDOSCOPY;  Service: Cardiovascular;  Laterality: N/A;  12:52 synched cardioversion at 120 joules,...afib to SR...12 lead EKG ordered.SABRASABRACardiozem d/c'ed per MD verbal order at SR   TEE WITHOUT CARDIOVERSION N/A 03/23/2015   Procedure: TRANSESOPHAGEAL ECHOCARDIOGRAM (TEE);  Surgeon: Oneil BROCKS Parchment, MD;  Location: [facility] ENDOSCOPY;  Service: Cardiovascular;  Laterality: N/A;   TEE WITHOUT CARDIOVERSION N/A 11/02/2019   Procedure: TRANSESOPHAGEAL ECHOCARDIOGRAM (TEE);  Surgeon: Delford Maude BROCKS, MD;  Location: [facility] ENDOSCOPY;  Service: Cardiovascular;  Laterality: N/A;   TEE WITHOUT CARDIOVERSION N/A 08/05/2023   Procedure: TRANSESOPHAGEAL ECHOCARDIOGRAM;  Surgeon:  Rolan Ezra RAMAN, MD;  Location: [facility] INVASIVE CV LAB;  Service: Cardiovascular;  Laterality: N/A;   THORACIC SPINE SURGERY  03/2000   ground calcium  deposits from upper thoracic (01/26/2013)   THYROIDECTOMY N/A 05/11/2021   Procedure: TOTAL THYROIDECTOMY;  Surgeon: Eletha Boas, MD;  Location: [facility] ORS;  Service: General;  Laterality: N/A;   TOTAL HIP ARTHROPLASTY Right 06/2007   TRANSCATHETER MITRAL EDGE TO EDGE REPAIR N/A 08/21/2023   Procedure: TRANSCATHETER MITRAL EDGE TO EDGE REPAIR;  Surgeon: Thukkani, Arun K, MD;  Location: [facility] INVASIVE CV LAB;  Service: Cardiovascular;  Laterality: N/A;   TRANSESOPHAGEAL ECHOCARDIOGRAM (CATH LAB) N/A 08/09/2023   Procedure: TRANSESOPHAGEAL ECHOCARDIOGRAM;  Surgeon: Rolan Ezra RAMAN, MD;  Location: [facility] INVASIVE CV LAB;  Service: Cardiovascular;  Laterality: N/A;   TRANSESOPHAGEAL ECHOCARDIOGRAM (CATH LAB) N/A 08/21/2023   Procedure: TRANSESOPHAGEAL ECHOCARDIOGRAM;  Surgeon: Wendel Lurena POUR, MD;  Location: [facility] INVASIVE  CV LAB;  Service: Cardiovascular;  Laterality: N/A;   Patient Active Problem List   Diagnosis Date Noted   Vitamin B12 deficiency 04/20/2024   S/P mitral valve clip implantation 08/22/2023   Severe mitral regurgitation 08/20/2023   Postsurgical hypothyroidism 11/09/2022   Hypocalcemia 10/24/2021   Hypothyroidism 08/02/2021   Papillary thyroid  carcinoma (HCC) 08/02/2021   Neoplasm of uncertain behavior of thyroid  gland 05/05/2021   Lung nodule 02/15/2021   Right thyroid  nodule 02/15/2021   Atherosclerotic heart disease of native coronary artery without angina pectoris 02/23/2020   Heart failure, unspecified (HCC) 02/23/2020   Biventricular ICD (implantable cardioverter-defibrillator) in place 02/01/2020   Hypercoagulable state due to persistent atrial fibrillation (HCC) 12/01/2019   Statin myopathy 11/16/2019   Peripheral arterial disease 05/20/2019   Melanoma of skin (HCC) 07/28/2018   Benign prostatic hyperplasia 11/02/2016   Iron   deficiency anemia 05/25/2016   OSA (obstructive sleep apnea) 01/04/2016   Heme positive stool    Atrial fibrillation with RVR (HCC) 03/21/2015   Chronic systolic CHF (congestive heart failure) (HCC) 03/08/2015   Hx of adenomatous colonic polyps 02/10/2015   Pancreatic cyst 02/10/2015   Loose stools 02/10/2015   Encounter for monitoring amiodarone  therapy 01/10/2015   Hypotension 10/04/2014   Nonischemic cardiomyopathy (HCC) 10/04/2014   Hyperglycemia 10/04/2014   Troponin level elevated 10/04/2014   Sleep-disordered breathing 10/04/2014   Obesity 10/04/2014   Normocytic anemia 10/04/2014   Thrombocytopenia 10/04/2014   Atrial fibrillation with rapid ventricular response (HCC) 10/02/2014   Sinus bradycardia 10/02/2014   Acute on chronic systolic CHF (congestive heart failure) (HCC) 10/02/2014   Skin cancer 07/21/2014   cardiomyopathy 01/24/2013   Atrial fibrillation (HCC) 01/22/2013   Arthritis    Hyperlipidemia    Hypertension    Unspecified glaucoma 11/18/2012   Infected sebaceous cyst 05/15/2012   REFERRING PROVIDER: Cordella Rhein MD  REFERRING DIAG: Acute right-sided low back pain.  Rationale for Evaluation and Treatment: Rehabilitation  THERAPY DIAG:  Other low back pain  Muscle spasm of back  Abnormal posture  ONSET DATE: 3 weeks+.    SUBJECTIVE:                                                                                                                                                                                           SUBJECTIVE STATEMENT: The patient presents to the clinic with c/o right-sided low back pain that radiates into his right buttock.  He states the pain came on for no apparent reason about 3 weeks ago.  He states he pain can become severe with certain movements such as rising from a chair, and getting in and  out of bed.  He describes the pain as sore and sharp.  Not moving decreases his pain.    PERTINENT HISTORY:  Pacemaker.  Right  THA.  PAIN:  Are you having pain? Yes: NPRS scale: 7-8/10.   Pain location: Right low back and buttock.   Pain description: As above.   Aggravating factors: As above.   Relieving factors: As above.    PRECAUTIONS: Other: Recommended he use a cane for safety and help assist with improving his posture.    RED FLAGS: None   WEIGHT BEARING RESTRICTIONS: No  FALLS:  Has patient fallen in last 6 months? Yes. Number of falls Patient states he was getting out of his car to come in her his PT treatment and was paying attention to the curb and tripped on it and fell.  He reported no injury.    LIVING ENVIRONMENT: Lives with: lives with their spouse Lives in: House/apartment Has following equipment at home: None  OCCUPATION: Retired.    PLOF: Independent  PATIENT GOALS: Just being able to function more.   OBJECTIVE:  Note: Objective measures were completed at Evaluation unless otherwise noted.  DIAGNOSTIC FINDINGS:  X-ray:  Ankylosis of multiple areas of his low back   PATIENT SURVEYS:  ODI score:  30/50.  POSTURE: rounded shoulders, forward head, decreased lumbar lordosis, and flexed trunk , right lateral trunk flexion.    PALPATION: TTP right low back and with overpressure at L4-S1 spinous processes.  LUMBAR ROM:   Active trunk flexion limited by 75% and extension to 0 degrees.   LOWER EXTREMITY MMT:    Decreased right hip flexion strength (4-/5) likely due pain.     GAIT: Very antalgic with a flexed and right lateral flexed trunk posture and decrease foot clearance.  Recommended to patient he use his cane.    TREATMENT DATE: Patient in left sdly position with pillow bettwen knees whiel receiving STW/M x 9 minutes f/b HMP x 15 minutes with patient in supine with leg elevator for comfort.                                                                                                                                   PATIENT EDUCATION:  Education details: Visual merchandiser. Person educated: Patient Education method: Explanation Education comprehension: verbalized understanding  HOME EXERCISE PROGRAM:   ASSESSMENT:  CLINICAL IMPRESSION: The patient presents to OPPT with severe right-sided low back pain.  Transitory movements (sit to stand, sit to supine to sit) are extremely painful.  He is tender to palpation over his right low back and with overpressure at L4-S1 spinous processes.  He gait is very antalgic in nature and he walks in a flexed trunk and right lateral flexed posture.  Recommended he use a cane for additional safety and to help improve his posture.  His ODI score is 30/50.  Patient will benefit from skilled physical therapy intervention to address  pain and deficits.   OBJECTIVE IMPAIRMENTS: Abnormal gait, decreased activity tolerance, decreased mobility, difficulty walking, decreased ROM, decreased strength, increased muscle spasms, postural dysfunction, and pain.   ACTIVITY LIMITATIONS: carrying, lifting, bending, sitting, squatting, sleeping, bed mobility, dressing, and locomotion level  PARTICIPATION LIMITATIONS: meal prep, cleaning, laundry, driving, community activity, and yard work  PERSONAL FACTORS: 1-2 comorbidities: pacemaker, right total hip replacement are also affecting patient's functional outcome.   REHAB POTENTIAL: Good  CLINICAL DECISION MAKING: Evolving/moderate complexity  EVALUATION COMPLEXITY: Moderate   GOALS:   LONG TERM GOALS: Target date: 10/01/24  Ind with a HEP.  Goal status: INITIAL  2.  Transition from sit to stand with pain not > 3/10.  Goal status: INITIAL  3.  Roll over in bed with pain not > 3/10.  Goal status: INITIAL  4.  Perform ADL's with pain not > 3/10.  Goal status: INITIAL  5.  Improve ODI score by at least 10 points.  Goal status: INITIAL  PLAN:  PT FREQUENCY: 2x/week  PT DURATION: 6 weeks  PLANNED INTERVENTIONS: 97110-Therapeutic exercises, 97530- Therapeutic activity,  [id number]- Neuromuscular re-education, 97535- Self Care, [id number]- Manual therapy, and Patient/Family education.  PLAN FOR NEXT SESSION: STW/M, core exercise progression.     Emmitt Matthews, PT 08/20/2024, 5:58 PM

## 2024-08-20 NOTE — Progress Notes (Signed)
  Electrophysiology Office Note:   Date:  08/21/2024  ID:  Chad Brown, DOB 1941-01-02, MRN [id number]  Primary Cardiologist: None Primary Heart Failure: [name], MD Electrophysiologist: Soyla Gladis Norton, MD      History of Present Illness:   Chad Brown is a 83 y.o. male with h/o CKD stage IV, sleep apnea, hyperlipidemia, papillary thyroid  carcinoma post thyroidectomy, chronic systolic heart failure, atrial fibrillation, valvular heart disease seen today for routine electrophysiology followup.   Since last being seen in our clinic the patient reports significant fatigue, weakness, shortness of breath.  He is in atrial fibrillation today and ventricular pacing.  He is post AV node ablation.  He has been somewhat short of breath with exertion as well.  He has no chest pain.  he denies chest pain, palpitations, dyspnea, PND, orthopnea, nausea, vomiting, dizziness, syncope, edema, weight gain, or early satiety.   Review of systems complete and found to be negative unless listed in HPI.      EP Information / Studies Reviewed:    EKG is ordered today. Personal review as below.      ICD Interrogation-  reviewed in detail today,  See PACEART report.  Device History: Medtronic BiV ICD implanted 2016, generator change 09/16/2023 for chronic systolic heart failure History of appropriate therapy: No History of AAD therapy: Yes; currently on amiodarone    Risk Assessment/Calculations:    CHA2DS2-VASc Score = 4   This indicates a 4.8% annual risk of stroke. The patient's score is based upon: CHF History: 1 HTN History: 1 Diabetes History: 0 Stroke History: 0 Vascular Disease History: 0 Age Score: 2 Gender Score: 0            Physical Exam:   VS:  BP 95/66 (BP Location: Left Arm, Patient Position: Sitting, Cuff Size: Large)   Pulse 92   Ht 5' 6 (1.676 m)   Wt 204 lb (92.5 kg)   SpO2 96%   BMI 32.93 kg/m    Wt Readings from Last 3 Encounters:  08/21/24 204  lb (92.5 kg)  07/31/24 206 lb (93.4 kg)  07/09/24 206 lb 6.4 oz (93.6 kg)     GEN: Well nourished, well developed in no acute distress NECK: No JVD; No carotid bruits CARDIAC: Regular rate and rhythm, no murmurs, rubs, gallops RESPIRATORY:  Clear to auscultation without rales, wheezing or rhonchi  ABDOMEN: Soft, non-tender, non-distended EXTREMITIES:  No edema; No deformity   ASSESSMENT AND PLAN:    Chronic systolic dysfunction s/p Medtronic CRT-D  euvolemic today Stable on an appropriate medical regimen Normal ICD function See Pace Art report No changes today  2.  Persistent atrial fibrillation: On amiodarone .  Had a reduced biventricular pacing burden.  Now post AV node ablation.  His blood pressure is low.  Chad Brown stop his metoprolol .  Chad Brown continue amiodarone  as he has been in and out of atrial fibrillation.  3.  Secondary hypercoagulable state: On Xarelto   4.  Valvular heart disease: Post MTEER.  Functioning well on recent echo.  Plan per primary team.  5.  High risk medication monitoring: On amiodarone .  Chad Brown check LFTs today  Disposition:   Follow up with EP Team in 12 months   Signed, Dima Mini Gladis Norton, MD

## 2024-08-21 ENCOUNTER — Ambulatory Visit: Attending: Cardiology | Admitting: Cardiology

## 2024-08-21 ENCOUNTER — Encounter: Payer: Self-pay | Admitting: Cardiology

## 2024-08-21 VITALS — BP 95/66 | HR 92 | Ht 66.0 in | Wt 204.0 lb

## 2024-08-21 DIAGNOSIS — D6869 Other thrombophilia: Secondary | ICD-10-CM

## 2024-08-21 DIAGNOSIS — Z95810 Presence of automatic (implantable) cardiac defibrillator: Secondary | ICD-10-CM

## 2024-08-21 DIAGNOSIS — I428 Other cardiomyopathies: Secondary | ICD-10-CM

## 2024-08-21 DIAGNOSIS — I4819 Other persistent atrial fibrillation: Secondary | ICD-10-CM | POA: Diagnosis not present

## 2024-08-21 DIAGNOSIS — I447 Left bundle-branch block, unspecified: Secondary | ICD-10-CM

## 2024-08-21 LAB — CUP PACEART INCLINIC DEVICE CHECK
Date Time Interrogation Session: 20251121114303
Implantable Lead Connection Status: 753985
Implantable Lead Connection Status: 753985
Implantable Lead Connection Status: 753985
Implantable Lead Implant Date: 20160607
Implantable Lead Implant Date: 20160607
Implantable Lead Implant Date: 20160607
Implantable Lead Location: 753858
Implantable Lead Location: 753859
Implantable Lead Location: 753860
Implantable Lead Model: 4598
Implantable Lead Model: 5076
Implantable Pulse Generator Implant Date: 20241216

## 2024-08-21 NOTE — Patient Instructions (Signed)
 Medication Instructions:  STOP Amiodarone  TAKE Lasix  for 3 days and then return to normal dosing  *If you need a refill on your cardiac medications before your next appointment, please call your pharmacy*  Lab Work: None ordered  If you have any lab test that is abnormal or we need to change your treatment, we will call you to review the results.  Testing/Procedures: None ordered  Follow-Up: At [facility], you and your health needs are our priority.  As part of our continuing mission to provide you with exceptional heart care, our providers are all part of one team.  This team includes your primary Cardiologist (physician) and Advanced Practice Providers or APPs (Physician Assistants and Nurse Practitioners) who all work together to provide you with the care you need, when you need it.  Your next appointment:   1 year(s)  Provider:   You will see one of the following Advanced Practice Providers on your designated Care Team:   [name], PA-C Michael Andy Tillery, PA-C Suzann Riddle, NP Daphne Barrack, NP Artist Pouch, PA-C   Thank you for choosing [facility]!!   [phone]

## 2024-08-23 ENCOUNTER — Ambulatory Visit: Payer: Self-pay | Admitting: Cardiology

## 2024-08-24 ENCOUNTER — Ambulatory Visit: Admitting: Physical Therapy

## 2024-08-26 ENCOUNTER — Ambulatory Visit

## 2024-08-26 DIAGNOSIS — M62830 Muscle spasm of back: Secondary | ICD-10-CM | POA: Diagnosis not present

## 2024-08-26 DIAGNOSIS — R293 Abnormal posture: Secondary | ICD-10-CM

## 2024-08-26 DIAGNOSIS — M5459 Other low back pain: Secondary | ICD-10-CM

## 2024-08-26 NOTE — Therapy (Signed)
 OUTPATIENT PHYSICAL THERAPY THORACOLUMBAR TREATMENT    Patient Name: Chad Brown MRN: [id number] DOB:1940/10/15, 83 y.o., male Today's Date: 08/26/2024  END OF SESSION:  PT End of Session - 08/26/24 1408     Visit Number 2    Number of Visits 12    Date for Recertification  10/01/24    PT Start Time 1358    PT Stop Time 1431    PT Time Calculation (min) 33 min    Activity Tolerance Patient tolerated treatment well    Behavior During Therapy [facility] for tasks assessed/performed          Past Medical History:  Diagnosis Date   Adenomatous colon polyp    tubular   AICD (automatic cardioverter/defibrillator) present 03/08/2015   MDT CRTD dual pacemaker and defib   Anemia    iron  deficient   Arthritis    about all my joints; hands, knees, back (03/08/2015)   Atherosclerosis    Cataract    left eye small   Cholelithiasis    gallstones   Chronic systolic CHF (congestive heart failure) (HCC)    a. New dx 12/2012 ? NICM, may be r/t afib. b. Nuc 03/2013 - normal;  c. 03/2015 TEE EF 15-20%.   Colon polyp, hyperplastic 01/2015   removed precancerous lesions   Depression    Diverticulosis    Dysrhythmia    afib   GERD (gastroesophageal reflux disease)    Glaucoma    right eye   Hyperlipidemia    Hypertension    Melanoma of eye (HCC) 2000's   right; it's never been biopsied   Melanoma of lower leg (HCC) 2015   left; right at my knee   Myocardial infarction (HCC) 1998   OSA (obstructive sleep apnea) 01/04/2016   no longer tolerates cpap   Peripheral vision loss, right 2006   Persistent atrial fibrillation (HCC)    a. Dx 12/2012, s/p TEE/DCCV 01/26/13. b. On Xarelto  (CHA2DS2VASc = 3);  c. 03/2015 TEE (EF 15-20%, no LAA thrombus) and DCCV - amio increased to 200 mg bid.   Pneumonia    S/P mitral valve clip implantation 08/21/2023   Placement of 1 XTW in the A2/P2 position and 1 NTW in the A1/P1 position by Dr. Wendel and Dr. Wonda   Urinary hesitancy due to benign  prostatic hypertrophy    Past Surgical History:  Procedure Laterality Date   ATRIAL FIBRILLATION ABLATION N/A 11/03/2019   Procedure: ATRIAL FIBRILLATION ABLATION;  Surgeon: Kelsie Agent, MD;  Location: [facility] INVASIVE CV LAB;  Service: Cardiovascular;  Laterality: N/A;   ATRIAL FIBRILLATION ABLATION N/A 02/13/2024   Procedure: ATRIAL FIBRILLATION ABLATION;  Surgeon: Inocencio Soyla Lunger, MD;  Location: [facility] INVASIVE CV LAB;  Service: Cardiovascular;  Laterality: N/A;   AV NODE ABLATION N/A 07/31/2024   Procedure: AV NODE ABLATION;  Surgeon: Inocencio Soyla Lunger, MD;  Location: [facility] INVASIVE CV LAB;  Service: Cardiovascular;  Laterality: N/A;   BACK SURGERY     upper back, cannot turn neck well   BIV ICD GENERATOR CHANGEOUT N/A 09/16/2023   Procedure: BIV ICD GENERATOR CHANGEOUT;  Surgeon: Inocencio Soyla Lunger, MD;  Location: [facility] INVASIVE CV LAB;  Service: Cardiovascular;  Laterality: N/A;   CARDIAC CATHETERIZATION  1998   CARDIOVERSION N/A 01/26/2013   Procedure: CARDIOVERSION;  Surgeon: Redell GORMAN Shallow, MD;  Location: [facility] ENDOSCOPY;  Service: Cardiovascular;  Laterality: N/A;   CARDIOVERSION N/A 03/23/2015   Procedure: CARDIOVERSION;  Surgeon: Oneil JAYSON Parchment, MD;  Location: [facility] ENDOSCOPY;  Service: Cardiovascular;  Laterality: N/A;   CARDIOVERSION N/A 08/14/2017   Procedure: CARDIOVERSION;  Surgeon: Delford Maude BROCKS, MD;  Location: [facility] ENDOSCOPY;  Service: Cardiovascular;  Laterality: N/A;   CATARACT EXTRACTION Right ~ 2006   COLONOSCOPY WITH PROPOFOL  N/A 02/10/2015   Procedure: COLONOSCOPY WITH PROPOFOL ;  Surgeon: Lupita FORBES Commander, MD;  Location: [facility] ENDOSCOPY;  Service: Endoscopy;  Laterality: N/A;   COLONOSCOPY WITH PROPOFOL  N/A 08/07/2016   Procedure: COLONOSCOPY WITH PROPOFOL ;  Surgeon: Lupita FORBES Commander, MD;  Location: [facility] ENDOSCOPY;  Service: Endoscopy;  Laterality: N/A;   ENTEROSCOPY N/A 08/17/2015   Procedure: ENTEROSCOPY;  Surgeon: Lupita FORBES Commander, MD;  Location: [facility] ENDOSCOPY;  Service: Endoscopy;   Laterality: N/A;   EP IMPLANTABLE DEVICE N/A 03/08/2015   MDT Fraser Hawthorne CRT-D for nonischemic CM by Dr Kelsie for primary prevention   GLAUCOMA SURGERY Right ~ 2006   put 3 stents in to drain fluid (03/08/2015) not successful, sent to [facility] to try to get last stent out   HOT HEMOSTASIS N/A 08/07/2016   Procedure: HOT HEMOSTASIS (ARGON PLASMA COAGULATION/BICAP);  Surgeon: Lupita FORBES Commander, MD;  Location: [facility];  Service: Endoscopy;  Laterality: N/A;   INCISION AND DRAINAGE ABSCESS POSTERIOR CERVICALSPINE  05/2012   JOINT REPLACEMENT     MELANOMA EXCISION Left 2015   lower leg; right at my knee   REFRACTIVE SURGERY Right ~ 2006 X 2   twice; both done at [facility] (03/08/2015   RIGHT HEART CATH N/A 08/05/2023   Procedure: RIGHT HEART CATH;  Surgeon: Rolan Ezra RAMAN, MD;  Location: [facility] INVASIVE CV LAB;  Service: Cardiovascular;  Laterality: N/A;   SURGERY SCROTAL / TESTICULAR Right 1990's   TEE WITHOUT CARDIOVERSION N/A 01/26/2013   Procedure: TRANSESOPHAGEAL ECHOCARDIOGRAM (TEE);  Surgeon: Redell RAMAN Shallow, MD;  Location: [facility] ENDOSCOPY;  Service: Cardiovascular;  Laterality: N/A;  [name] anes. /    TEE WITHOUT CARDIOVERSION N/A 10/05/2014   Procedure: TRANSESOPHAGEAL ECHOCARDIOGRAM (TEE)  with cardioversion;  Surgeon: Aleene JINNY Passe, MD;  Location: [facility] ENDOSCOPY;  Service: Cardiovascular;  Laterality: N/A;  12:52 synched cardioversion at 120 joules,...afib to SR...12 lead EKG ordered.SABRASABRACardiozem d/c'ed per MD verbal order at SR   TEE WITHOUT CARDIOVERSION N/A 03/23/2015   Procedure: TRANSESOPHAGEAL ECHOCARDIOGRAM (TEE);  Surgeon: Oneil BROCKS Parchment, MD;  Location: [facility] ENDOSCOPY;  Service: Cardiovascular;  Laterality: N/A;   TEE WITHOUT CARDIOVERSION N/A 11/02/2019   Procedure: TRANSESOPHAGEAL ECHOCARDIOGRAM (TEE);  Surgeon: Delford Maude BROCKS, MD;  Location: [facility] ENDOSCOPY;  Service: Cardiovascular;  Laterality: N/A;   TEE WITHOUT CARDIOVERSION N/A 08/05/2023   Procedure: TRANSESOPHAGEAL ECHOCARDIOGRAM;  Surgeon:  Rolan Ezra RAMAN, MD;  Location: [facility] INVASIVE CV LAB;  Service: Cardiovascular;  Laterality: N/A;   THORACIC SPINE SURGERY  03/2000   ground calcium  deposits from upper thoracic (01/26/2013)   THYROIDECTOMY N/A 05/11/2021   Procedure: TOTAL THYROIDECTOMY;  Surgeon: Eletha Boas, MD;  Location: [facility] ORS;  Service: General;  Laterality: N/A;   TOTAL HIP ARTHROPLASTY Right 06/2007   TRANSCATHETER MITRAL EDGE TO EDGE REPAIR N/A 08/21/2023   Procedure: TRANSCATHETER MITRAL EDGE TO EDGE REPAIR;  Surgeon: Thukkani, Arun K, MD;  Location: [facility] INVASIVE CV LAB;  Service: Cardiovascular;  Laterality: N/A;   TRANSESOPHAGEAL ECHOCARDIOGRAM (CATH LAB) N/A 08/09/2023   Procedure: TRANSESOPHAGEAL ECHOCARDIOGRAM;  Surgeon: Rolan Ezra RAMAN, MD;  Location: [facility] INVASIVE CV LAB;  Service: Cardiovascular;  Laterality: N/A;   TRANSESOPHAGEAL ECHOCARDIOGRAM (CATH LAB) N/A 08/21/2023   Procedure: TRANSESOPHAGEAL ECHOCARDIOGRAM;  Surgeon: Wendel Lurena POUR, MD;  Location: [facility]  INVASIVE CV LAB;  Service: Cardiovascular;  Laterality: N/A;   Patient Active Problem List   Diagnosis Date Noted   Vitamin B12 deficiency 04/20/2024   S/P mitral valve clip implantation 08/22/2023   Severe mitral regurgitation 08/20/2023   Postsurgical hypothyroidism 11/09/2022   Hypocalcemia 10/24/2021   Hypothyroidism 08/02/2021   Papillary thyroid  carcinoma (HCC) 08/02/2021   Neoplasm of uncertain behavior of thyroid  gland 05/05/2021   Lung nodule 02/15/2021   Right thyroid  nodule 02/15/2021   Atherosclerotic heart disease of native coronary artery without angina pectoris 02/23/2020   Heart failure, unspecified (HCC) 02/23/2020   Biventricular ICD (implantable cardioverter-defibrillator) in place 02/01/2020   Hypercoagulable state due to persistent atrial fibrillation (HCC) 12/01/2019   Statin myopathy 11/16/2019   Peripheral arterial disease 05/20/2019   Melanoma of skin (HCC) 07/28/2018   Benign prostatic hyperplasia 11/02/2016   Iron   deficiency anemia 05/25/2016   OSA (obstructive sleep apnea) 01/04/2016   Heme positive stool    Atrial fibrillation with RVR (HCC) 03/21/2015   Chronic systolic CHF (congestive heart failure) (HCC) 03/08/2015   Hx of adenomatous colonic polyps 02/10/2015   Pancreatic cyst 02/10/2015   Loose stools 02/10/2015   Encounter for monitoring amiodarone  therapy 01/10/2015   Hypotension 10/04/2014   Nonischemic cardiomyopathy (HCC) 10/04/2014   Hyperglycemia 10/04/2014   Troponin level elevated 10/04/2014   Sleep-disordered breathing 10/04/2014   Obesity 10/04/2014   Normocytic anemia 10/04/2014   Thrombocytopenia 10/04/2014   Atrial fibrillation with rapid ventricular response (HCC) 10/02/2014   Sinus bradycardia 10/02/2014   Acute on chronic systolic CHF (congestive heart failure) (HCC) 10/02/2014   Skin cancer 07/21/2014   cardiomyopathy 01/24/2013   Atrial fibrillation (HCC) 01/22/2013   Arthritis    Hyperlipidemia    Hypertension    Unspecified glaucoma 11/18/2012   Infected sebaceous cyst 05/15/2012   REFERRING PROVIDER: Cordella Rhein MD  REFERRING DIAG: Acute right-sided low back pain.  Rationale for Evaluation and Treatment: Rehabilitation  THERAPY DIAG:  Other low back pain  Muscle spasm of back  Abnormal posture  ONSET DATE: 3 weeks+.    SUBJECTIVE:                                                                                                                                                                                           SUBJECTIVE STATEMENT: Pt reports 6-7/10 low back pain.   PERTINENT HISTORY:  Pacemaker.  Right THA.  PAIN:  Are you having pain? Yes: NPRS scale: 6-7/10.   Pain location: Right low back and buttock.   Pain description: As above.   Aggravating factors: As above.   Relieving factors: As above.  PRECAUTIONS: Other: Recommended he use a cane for safety and help assist with improving his posture.    RED FLAGS: None   WEIGHT  BEARING RESTRICTIONS: No  FALLS:  Has patient fallen in last 6 months? Yes. Number of falls Patient states he was getting out of his car to come in her his PT treatment and was paying attention to the curb and tripped on it and fell.  He reported no injury.    LIVING ENVIRONMENT: Lives with: lives with their spouse Lives in: House/apartment Has following equipment at home: None  OCCUPATION: Retired.    PLOF: Independent  PATIENT GOALS: Just being able to function more.   OBJECTIVE:  Note: Objective measures were completed at Evaluation unless otherwise noted.  DIAGNOSTIC FINDINGS:  X-ray:  Ankylosis of multiple areas of his low back   PATIENT SURVEYS:  ODI score:  30/50.  POSTURE: rounded shoulders, forward head, decreased lumbar lordosis, and flexed trunk , right lateral trunk flexion.    PALPATION: TTP right low back and with overpressure at L4-S1 spinous processes.  LUMBAR ROM:   Active trunk flexion limited by 75% and extension to 0 degrees.   LOWER EXTREMITY MMT:    Decreased right hip flexion strength (4-/5) likely due pain.     GAIT: Very antalgic with a flexed and right lateral flexed trunk posture and decrease foot clearance.  Recommended to patient he use his cane.    TREATMENT DATE:     08/26/24                                 EXERCISE LOG  Exercise Repetitions and Resistance Comments  Nustep Lvl 2 x 12 mins        Blank cell = exercise not performed today   Manual Therapy Soft Tissue Mobilization: Right low back, STW/M to right lumbar paraspinals to decrease pain and tone with pt seated in chair   Patient in left sdly position with pillow bettwen knees whiel receiving STW/M x 9 minutes f/b HMP x 15 minutes with patient in supine with leg elevator for comfort.                                                                                                                                   PATIENT EDUCATION:  Education details: Visual merchandiser. Person educated: Patient Education method: Explanation Education comprehension: verbalized understanding  HOME EXERCISE PROGRAM:   ASSESSMENT:  CLINICAL IMPRESSION: Pt arrives for today's treatment session reporting 6-7/10 low back pain.  Pt arrives for today's treatment session 14 mins late.  Pt able to tolerate Nustep today for warm up with minimal increase in pain.  STW/M performed to right lumbar paraspinals and QL to decrease pain and tone with pt seated in chair.  At completion of today's treatment session pt states his back feels the best it has  in weeks.     OBJECTIVE IMPAIRMENTS: Abnormal gait, decreased activity tolerance, decreased mobility, difficulty walking, decreased ROM, decreased strength, increased muscle spasms, postural dysfunction, and pain.   ACTIVITY LIMITATIONS: carrying, lifting, bending, sitting, squatting, sleeping, bed mobility, dressing, and locomotion level  PARTICIPATION LIMITATIONS: meal prep, cleaning, laundry, driving, community activity, and yard work  PERSONAL FACTORS: 1-2 comorbidities: pacemaker, right total hip replacement are also affecting patient's functional outcome.   REHAB POTENTIAL: Good  CLINICAL DECISION MAKING: Evolving/moderate complexity  EVALUATION COMPLEXITY: Moderate   GOALS:   LONG TERM GOALS: Target date: 10/01/24  Ind with a HEP.  Goal status: INITIAL  2.  Transition from sit to stand with pain not > 3/10.  Goal status: INITIAL  3.  Roll over in bed with pain not > 3/10.  Goal status: INITIAL  4.  Perform ADL's with pain not > 3/10.  Goal status: INITIAL  5.  Improve ODI score by at least 10 points.  Goal status: INITIAL  PLAN:  PT FREQUENCY: 2x/week  PT DURATION: 6 weeks  PLANNED INTERVENTIONS: 97110-Therapeutic exercises, 97530- Therapeutic activity, [id number]- Neuromuscular re-education, 97535- Self Care, [id number]- Manual therapy, and Patient/Family education.  PLAN FOR NEXT SESSION: STW/M, core  exercise progression.     Delon DELENA Gosling, PTA 08/26/2024, 3:21 PM

## 2024-08-31 ENCOUNTER — Other Ambulatory Visit: Payer: Self-pay | Admitting: Cardiology

## 2024-09-01 ENCOUNTER — Ambulatory Visit: Admitting: Physical Therapy

## 2024-09-01 DIAGNOSIS — M62830 Muscle spasm of back: Secondary | ICD-10-CM | POA: Diagnosis present

## 2024-09-01 DIAGNOSIS — R293 Abnormal posture: Secondary | ICD-10-CM | POA: Insufficient documentation

## 2024-09-01 DIAGNOSIS — M5459 Other low back pain: Secondary | ICD-10-CM | POA: Insufficient documentation

## 2024-09-01 NOTE — Therapy (Signed)
 OUTPATIENT PHYSICAL THERAPY THORACOLUMBAR TREATMENT    Patient Name: Chad Brown MRN: [id number] DOB:11/13/1940, 83 y.o., male Today's Date: 09/01/2024  END OF SESSION:  PT End of Session - 09/01/24 1349     Visit Number 3    Number of Visits 12    Date for Recertification  10/01/24    PT Start Time 1345    PT Stop Time 1425    PT Time Calculation (min) 40 min    Activity Tolerance Patient tolerated treatment well    Behavior During Therapy [facility] for tasks assessed/performed           Past Medical History:  Diagnosis Date   Adenomatous colon polyp    tubular   AICD (automatic cardioverter/defibrillator) present 03/08/2015   MDT CRTD dual pacemaker and defib   Anemia    iron  deficient   Arthritis    about all my joints; hands, knees, back (03/08/2015)   Atherosclerosis    Cataract    left eye small   Cholelithiasis    gallstones   Chronic systolic CHF (congestive heart failure) (HCC)    a. New dx 12/2012 ? NICM, may be r/t afib. b. Nuc 03/2013 - normal;  c. 03/2015 TEE EF 15-20%.   Colon polyp, hyperplastic 01/2015   removed precancerous lesions   Depression    Diverticulosis    Dysrhythmia    afib   GERD (gastroesophageal reflux disease)    Glaucoma    right eye   Hyperlipidemia    Hypertension    Melanoma of eye (HCC) 2000's   right; it's never been biopsied   Melanoma of lower leg (HCC) 2015   left; right at my knee   Myocardial infarction (HCC) 1998   OSA (obstructive sleep apnea) 01/04/2016   no longer tolerates cpap   Peripheral vision loss, right 2006   Persistent atrial fibrillation (HCC)    a. Dx 12/2012, s/p TEE/DCCV 01/26/13. b. On Xarelto  (CHA2DS2VASc = 3);  c. 03/2015 TEE (EF 15-20%, no LAA thrombus) and DCCV - amio increased to 200 mg bid.   Pneumonia    S/P mitral valve clip implantation 08/21/2023   Placement of 1 XTW in the A2/P2 position and 1 NTW in the A1/P1 position by Dr. Wendel and Dr. Wonda   Urinary hesitancy due to benign  prostatic hypertrophy    Past Surgical History:  Procedure Laterality Date   ATRIAL FIBRILLATION ABLATION N/A 11/03/2019   Procedure: ATRIAL FIBRILLATION ABLATION;  Surgeon: Kelsie Agent, MD;  Location: [facility] INVASIVE CV LAB;  Service: Cardiovascular;  Laterality: N/A;   ATRIAL FIBRILLATION ABLATION N/A 02/13/2024   Procedure: ATRIAL FIBRILLATION ABLATION;  Surgeon: Inocencio Soyla Lunger, MD;  Location: [facility] INVASIVE CV LAB;  Service: Cardiovascular;  Laterality: N/A;   AV NODE ABLATION N/A 07/31/2024   Procedure: AV NODE ABLATION;  Surgeon: Inocencio Soyla Lunger, MD;  Location: [facility] INVAS[facility] CV LAB;  Service: Cardiovascular;  Laterality: N/A;   BACK SURGERY     upper back, cannot turn neck well   BIV ICD GENERATOR CHANGEOUT N/A 09/16/2023   Procedure: BIV ICD GENERATOR CHANGEOUT;  Surgeon: Inocencio Soyla Lunger, MD;  Location: [facility] INVASIVE CV LAB;  Service: Cardiovascular;  Laterality: N/A;   CARDIAC CATHETERIZATION  1998   CARDIOVERSION N/A 01/26/2013   Procedure: CARDIOVERSION;  Surgeon: Redell GORMAN Shallow, MD;  Location: [facility] ENDOSCOPY;  Service: Cardiovascular;  Laterality: N/A;   CARDIOVERSION N/A 03/23/2015   Procedure: CARDIOVERSION;  Surgeon: Oneil JAYSON Parchment, MD;  Location: [facility] ENDOSCOPY;  Service: Cardiovascular;  Laterality: N/A;   CARDIOVERSION N/A 08/14/2017   Procedure: CARDIOVERSION;  Surgeon: Delford Maude BROCKS, MD;  Location: [facility] ENDOSCOPY;  Service: Cardiovascular;  Laterality: N/A;   CATARACT EXTRACTION Right ~ 2006   COLONOSCOPY WITH PROPOFOL  N/A 02/10/2015   Procedure: COLONOSCOPY WITH PROPOFOL ;  Surgeon: Lupita FORBES Commander, MD;  Location: [facility] ENDOSCOPY;  Service: Endoscopy;  Laterality: N/A;   COLONOSCOPY WITH PROPOFOL  N/A 08/07/2016   Procedure: COLONOSCOPY WITH PROPOFOL ;  Surgeon: Lupita FORBES Commander, MD;  Location: [facility] ENDOSCOPY;  Service: Endoscopy;  Laterality: N/A;   ENTEROSCOPY N/A 08/17/2015   Procedure: ENTEROSCOPY;  Surgeon: Lupita FORBES Commander, MD;  Location: [facility] ENDOSCOPY;  Service: Endoscopy;   Laterality: N/A;   EP IMPLANTABLE DEVICE N/A 03/08/2015   MDT Fraser Hawthorne CRT-D for nonischemic CM by Dr Kelsie for primary prevention   GLAUCOMA SURGERY Right ~ 2006   put 3 stents in to drain fluid (03/08/2015) not successful, sent to [facility] to try to get last stent out   HOT HEMOSTASIS N/A 08/07/2016   Procedure: HOT HEMOSTASIS (ARGON PLASMA COAGULATION/BICAP);  Surgeon: Lupita FORBES Commander, MD;  Location: [facility];  Service: Endoscopy;  Laterality: N/A;   INCISION AND DRAINAGE ABSCESS POSTERIOR CERVICALSPINE  05/2012   JOINT REPLACEMENT     MELANOMA EXCISION Left 2015   lower leg; right at my knee   REFRACTIVE SURGERY Right ~ 2006 X 2   twice; both done at [facility] (03/08/2015   RIGHT HEART CATH N/A 08/05/2023   Procedure: RIGHT HEART CATH;  Surgeon: Rolan Ezra RAMAN, MD;  Location: [facility] INVASIVE CV LAB;  Service: Cardiovascular;  Laterality: N/A;   SURGERY SCROTAL / TESTICULAR Right 1990's   TEE WITHOUT CARDIOVERSION N/A 01/26/2013   Procedure: TRANSESOPHAGEAL ECHOCARDIOGRAM (TEE);  Surgeon: Redell RAMAN Shallow, MD;  Location: [facility] ENDOSCOPY;  Service: Cardiovascular;  Laterality: N/A;  [name] anes. /    TEE WITHOUT CARDIOVERSION N/A 10/05/2014   Procedure: TRANSESOPHAGEAL ECHOCARDIOGRAM (TEE)  with cardioversion;  Surgeon: Aleene JINNY Passe, MD;  Location: [facility] ENDOSCOPY;  Service: Cardiovascular;  Laterality: N/A;  12:52 synched cardioversion at 120 joules,...afib to SR...12 lead EKG ordered.SABRASABRACardiozem d/c'ed per MD verbal order at SR   TEE WITHOUT CARDIOVERSION N/A 03/23/2015   Procedure: TRANSESOPHAGEAL ECHOCARDIOGRAM (TEE);  Surgeon: Oneil BROCKS Parchment, MD;  Location: [facility] ENDOSCOPY;  Service: Cardiovascular;  Laterality: N/A;   TEE WITHOUT CARDIOVERSION N/A 11/02/2019   Procedure: TRANSESOPHAGEAL ECHOCARDIOGRAM (TEE);  Surgeon: Delford Maude BROCKS, MD;  Location: [facility] ENDOSCOPY;  Service: Cardiovascular;  Laterality: N/A;   TEE WITHOUT CARDIOVERSION N/A 08/05/2023   Procedure: TRANSESOPHAGEAL ECHOCARDIOGRAM;  Surgeon:  Rolan Ezra RAMAN, MD;  Location: [facility] INVASIVE CV LAB;  Service: Cardiovascular;  Laterality: N/A;   THORACIC SPINE SURGERY  03/2000   ground calcium  deposits from upper thoracic (01/26/2013)   THYROIDECTOMY N/A 05/11/2021   Procedure: TOTAL THYROIDECTOMY;  Surgeon: Eletha Boas, MD;  Location: [facility] ORS;  Service: General;  Laterality: N/A;   TOTAL HIP ARTHROPLASTY Right 06/2007   TRANSCATHETER MITRAL EDGE TO EDGE REPAIR N/A 08/21/2023   Procedure: TRANSCATHETER MITRAL EDGE TO EDGE REPAIR;  Surgeon: Thukkani, Arun K, MD;  Location: [facility] INVASIVE CV LAB;  Service: Cardiovascular;  Laterality: N/A;   TRANSESOPHAGEAL ECHOCARDIOGRAM (CATH LAB) N/A 08/09/2023   Procedure: TRANSESOPHAGEAL ECHOCARDIOGRAM;  Surgeon: Rolan Ezra RAMAN, MD;  Location: [facility] INVASIVE CV LAB;  Service: Cardiovascular;  Laterality: N/A;   TRANSESOPHAGEAL ECHOCARDIOGRAM (CATH LAB) N/A 08/21/2023   Procedure: TRANSESOPHAGEAL ECHOCARDIOGRAM;  Surgeon: Wendel Lurena POUR, MD;  Location: [facility]  INVASIVE CV LAB;  Service: Cardiovascular;  Laterality: N/A;   Patient Active Problem List   Diagnosis Date Noted   Vitamin B12 deficiency 04/20/2024   S/P mitral valve clip implantation 08/22/2023   Severe mitral regurgitation 08/20/2023   Postsurgical hypothyroidism 11/09/2022   Hypocalcemia 10/24/2021   Hypothyroidism 08/02/2021   Papillary thyroid  carcinoma (HCC) 08/02/2021   Neoplasm of uncertain behavior of thyroid  gland 05/05/2021   Lung nodule 02/15/2021   Right thyroid  nodule 02/15/2021   Atherosclerotic heart disease of native coronary artery without angina pectoris 02/23/2020   Heart failure, unspecified (HCC) 02/23/2020   Biventricular ICD (implantable cardioverter-defibrillator) in place 02/01/2020   Hypercoagulable state due to persistent atrial fibrillation (HCC) 12/01/2019   Statin myopathy 11/16/2019   Peripheral arterial disease 05/20/2019   Melanoma of skin (HCC) 07/28/2018   Benign prostatic hyperplasia 11/02/2016   Iron   deficiency anemia 05/25/2016   OSA (obstructive sleep apnea) 01/04/2016   Heme positive stool    Atrial fibrillation with RVR (HCC) 03/21/2015   Chronic systolic CHF (congestive heart failure) (HCC) 03/08/2015   Hx of adenomatous colonic polyps 02/10/2015   Pancreatic cyst 02/10/2015   Loose stools 02/10/2015   Encounter for monitoring amiodarone  therapy 01/10/2015   Hypotension 10/04/2014   Nonischemic cardiomyopathy (HCC) 10/04/2014   Hyperglycemia 10/04/2014   Troponin level elevated 10/04/2014   Sleep-disordered breathing 10/04/2014   Obesity 10/04/2014   Normocytic anemia 10/04/2014   Thrombocytopenia 10/04/2014   Atrial fibrillation with rapid ventricular response (HCC) 10/02/2014   Sinus bradycardia 10/02/2014   Acute on chronic systolic CHF (congestive heart failure) (HCC) 10/02/2014   Skin cancer 07/21/2014   cardiomyopathy 01/24/2013   Atrial fibrillation (HCC) 01/22/2013   Arthritis    Hyperlipidemia    Hypertension    Unspecified glaucoma 11/18/2012   Infected sebaceous cyst 05/15/2012   REFERRING PROVIDER: Cordella Rhein MD  REFERRING DIAG: Acute right-sided low back pain.  Rationale for Evaluation and Treatment: Rehabilitation  THERAPY DIAG:  No diagnosis found.  ONSET DATE: 3 weeks+.    SUBJECTIVE:                                                                                                                                                                                           SUBJECTIVE STATEMENT: Pt reports 5-6/10 low back pain. Reports continued issues with getting in bed. Felt that last session helped for a little bit. Feels he is walking a little better.   PERTINENT HISTORY:  Pacemaker.  Right THA.  PAIN:  Are you having pain? Yes: NPRS scale: 5-6/10.   Pain location: Right low back and buttock.   Pain description:  As above.   Aggravating factors: As above.   Relieving factors: As above.    PRECAUTIONS: Other: Recommended he use a cane  for safety and help assist with improving his posture.    RED FLAGS: None   WEIGHT BEARING RESTRICTIONS: No  FALLS:  Has patient fallen in last 6 months? Yes. Number of falls Patient states he was getting out of his car to come in her his PT treatment and was paying attention to the curb and tripped on it and fell.  He reported no injury.    LIVING ENVIRONMENT: Lives with: lives with their spouse Lives in: House/apartment Has following equipment at home: None  OCCUPATION: Retired.    PLOF: Independent  PATIENT GOALS: Just being able to function more.   OBJECTIVE:  Note: Objective measures were completed at Evaluation unless otherwise noted.  DIAGNOSTIC FINDINGS:  X-ray:  Ankylosis of multiple areas of his low back   PATIENT SURVEYS:  ODI score:  30/50.  POSTURE: rounded shoulders, forward head, decreased lumbar lordosis, and flexed trunk , right lateral trunk flexion.    PALPATION: TTP right low back and with overpressure at L4-S1 spinous processes.  LUMBAR ROM:  Active trunk flexion limited by 75% and extension to 0 degrees.   LOWER EXTREMITY MMT:   Decreased right hip flexion strength (4-/5) likely due pain.     GAIT: Very antalgic with a flexed and right lateral flexed trunk posture and decrease foot clearance.  Recommended to patient he use his cane.    TREATMENT DATE:  09/01/24                                 EXERCISE LOG  Exercise Repetitions and Resistance Comments  Nustep Lvl 3 x 15 mins   Lumbar ext x10   Standing L stretch X30   Standing L stretch into lateral flexion X30 R only   Sitting piriformis stretch X30 R&L   Sitting figure 4 stretch  X30 R&L   Sitting QL stretch X30 R&L   Sitting lateral trunk flexion with pball x15   Sitting pball press down ab set x15   EOB transfers sit to sidelying and then sidelying to sit  Cues for core bracing   Blank cell = exercise not performed today   Manual Therapy Soft Tissue Mobilization attempted  but pt reports minimal tenderness today after lumbar stretches in standing     08/26/24                                 EXERCISE LOG  Exercise Repetitions and Resistance Comments  Nustep Lvl 2 x 12 mins        Blank cell = exercise not performed today   Manual Therapy Soft Tissue Mobilization: Right low back, STW/M to right lumbar paraspinals to decrease pain and tone with pt seated in chair   Patient in left sdly position with pillow bettwen knees whiel receiving STW/M x 9 minutes f/b HMP x 15 minutes with patient in supine with leg elevator for comfort.  PATIENT EDUCATION:  Education details: Magazine features editor. Person educated: Patient Education method: Explanation Education comprehension: verbalized understanding  HOME EXERCISE PROGRAM: Access Code: [email or web address] URL: [email or web address] Date: 09/01/2024 Prepared by: Brodric Schauer April Earnie Starring  Exercises - Standing Lumbar Extension with Counter  - 1 x daily - 7 x weekly - 1 sets - 10 reps - Seated Piriformis Stretch  - 1 x daily - 7 x weekly - 2 sets - 30 sec hold - Seated Piriformis Stretch  - 1 x daily - 7 x weekly - 2 sets - 30 sec hold - Seated Abdominal Press into [employer]  - 1 x daily - 7 x weekly - 2 sets - 10 reps - 3 sec hold  ASSESSMENT:  CLINICAL IMPRESSION: Treatment focused on initiating gentle stretches for improved lumbar and hip mobility for bed mobility. Reports improved muscle tone after lumbar ext against counter and less pain. Worked on initiating core strengthening anteriorly and laterally to improve core bracing for his transfers. Able to perform sit<>sidelying bed transfers with less pain by end of session with cues for core bracing.    OBJECTIVE IMPAIRMENTS: Abnormal gait, decreased activity tolerance, decreased mobility, difficulty walking, decreased ROM, decreased  strength, increased muscle spasms, postural dysfunction, and pain.   ACTIVITY LIMITATIONS: carrying, lifting, bending, sitting, squatting, sleeping, bed mobility, dressing, and locomotion level  PARTICIPATION LIMITATIONS: meal prep, cleaning, laundry, driving, community activity, and yard work  PERSONAL FACTORS: 1-2 comorbidities: pacemaker, right total hip replacement are also affecting patient's functional outcome.   REHAB POTENTIAL: Good  CLINICAL DECISION MAKING: Evolving/moderate complexity  EVALUATION COMPLEXITY: Moderate   GOALS:   LONG TERM GOALS: Target date: 10/01/24  Ind with a HEP.  Goal status: INITIAL  2.  Transition from sit to stand with pain not > 3/10.  Goal status: INITIAL  3.  Roll over in bed with pain not > 3/10.  Goal status: INITIAL  4.  Perform ADL's with pain not > 3/10.  Goal status: INITIAL  5.  Improve ODI score by at least 10 points.  Goal status: INITIAL  PLAN:  PT FREQUENCY: 2x/week  PT DURATION: 6 weeks  PLANNED INTERVENTIONS: 97110-Therapeutic exercises, 97530- Therapeutic activity, [id number]- Neuromuscular re-education, 97535- Self Care, [id number]- Manual therapy, and Patient/Family education.  PLAN FOR NEXT SESSION: STW/M, core exercise progression. Review core bracing with log rolls in/out of bed. Hip/lumbar stretching and strengthening.   Janille Draughon April Ma L Lissete Maestas, PT, DPT 09/01/2024, 1:49 PM

## 2024-09-03 ENCOUNTER — Other Ambulatory Visit (HOSPITAL_COMMUNITY): Payer: Self-pay

## 2024-09-03 ENCOUNTER — Ambulatory Visit: Admitting: Physical Therapy

## 2024-09-03 DIAGNOSIS — M5459 Other low back pain: Secondary | ICD-10-CM

## 2024-09-03 DIAGNOSIS — M62830 Muscle spasm of back: Secondary | ICD-10-CM

## 2024-09-03 DIAGNOSIS — R293 Abnormal posture: Secondary | ICD-10-CM

## 2024-09-03 NOTE — Therapy (Signed)
 OUTPATIENT PHYSICAL THERAPY THORACOLUMBAR TREATMENT    Patient Name: Chad Brown MRN: [id number] DOB:Mar 15, 1941, 83 y.o., male Today's Date: 09/03/2024  END OF SESSION:  PT End of Session - 09/03/24 1806     Visit Number 4    Number of Visits 12    Date for Recertification  10/01/24    PT Start Time 0230    PT Stop Time 0314    PT Time Calculation (min) 44 min    Activity Tolerance Patient tolerated treatment well    Behavior During Therapy [facility] for tasks assessed/performed            Past Medical History:  Diagnosis Date   Adenomatous colon polyp    tubular   AICD (automatic cardioverter/defibrillator) present 03/08/2015   MDT CRTD dual pacemaker and defib   Anemia    iron  deficient   Arthritis    about all my joints; hands, knees, back (03/08/2015)   Atherosclerosis    Cataract    left eye small   Cholelithiasis    gallstones   Chronic systolic CHF (congestive heart failure) (HCC)    a. New dx 12/2012 ? NICM, may be r/t afib. b. Nuc 03/2013 - normal;  c. 03/2015 TEE EF 15-20%.   Colon polyp, hyperplastic 01/2015   removed precancerous lesions   Depression    Diverticulosis    Dysrhythmia    afib   GERD (gastroesophageal reflux disease)    Glaucoma    right eye   Hyperlipidemia    Hypertension    Melanoma of eye (HCC) 2000's   right; it's never been biopsied   Melanoma of lower leg (HCC) 2015   left; right at my knee   Myocardial infarction (HCC) 1998   OSA (obstructive sleep apnea) 01/04/2016   no longer tolerates cpap   Peripheral vision loss, right 2006   Persistent atrial fibrillation (HCC)    a. Dx 12/2012, s/p TEE/DCCV 01/26/13. b. On Xarelto  (CHA2DS2VASc = 3);  c. 03/2015 TEE (EF 15-20%, no LAA thrombus) and DCCV - amio increased to 200 mg bid.   Pneumonia    S/P mitral valve clip implantation 08/21/2023   Placement of 1 XTW in the A2/P2 position and 1 NTW in the A1/P1 position by Dr. Wendel and Dr. Wonda   Urinary hesitancy due to benign  prostatic hypertrophy    Past Surgical History:  Procedure Laterality Date   ATRIAL FIBRILLATION ABLATION N/A 11/03/2019   Procedure: ATRIAL FIBRILLATION ABLATION;  Surgeon: Kelsie Agent, MD;  Location: [facility] INVASIVE CV LAB;  Service: Cardiovascular;  Laterality: N/A;   ATRIAL FIBRILLATION ABLATION N/A 02/13/2024   Procedure: ATRIAL FIBRILLATION ABLATION;  Surgeon: Inocencio Soyla Lunger, MD;  Location: [facility];  Service: Cardiovascular;  Laterality: N/A;   AV NODE ABLATION N/A 07/31/2024   Procedure: AV NODE ABLATION;  Surgeon: Inocencio Soyla Lunger, MD;  Location: [facility];  Service: Cardiovascular;  Laterality: N/A;   BACK SURGERY     upper back, cannot turn neck well   BIV ICD GENERATOR CHANGEOUT N/A 09/16/2023   Procedure: BIV ICD GENERATOR CHANGEOUT;  Surgeon: Inocencio Soyla Lunger, MD;  Location: [facility] INVASIVE CV LAB;  Service: Cardiovascular;  Laterality: N/A;   CARDIAC CATHETERIZATION  1998   CARDIOVERSION N/A 01/26/2013   Procedure: CARDIOVERSION;  Surgeon: Redell GORMAN Shallow, MD;  Location: [facility] ENDOSCOPY;  Service: Cardiovascular;  Laterality: N/A;   CARDIOVERSION N/A 03/23/2015   Procedure: CARDIOVERSION;  Surgeon: Oneil JAYSON Parchment, MD;  Location: [facility]  ENDOSCOPY;  Service: Cardiovascular;  Laterality: N/A;   CARDIOVERSION N/A 08/14/2017   Procedure: CARDIOVERSION;  Surgeon: Delford Maude BROCKS, MD;  Location: [facility] ENDOSCOPY;  Service: Cardiovascular;  Laterality: N/A;   CATARACT EXTRACTION Right ~ 2006   COLONOSCOPY WITH PROPOFOL  N/A 02/10/2015   Procedure: COLONOSCOPY WITH PROPOFOL ;  Surgeon: Lupita FORBES Commander, MD;  Location: [facility] ENDOSCOPY;  Service: Endoscopy;  Laterality: N/A;   COLONOSCOPY WITH PROPOFOL  N/A 08/07/2016   Procedure: COLONOSCOPY WITH PROPOFOL ;  Surgeon: Lupita FORBES Commander, MD;  Location: [facility] ENDOSCOPY;  Service: Endoscopy;  Laterality: N/A;   ENTEROSCOPY N/A 08/17/2015   Procedure: ENTEROSCOPY;  Surgeon: Lupita FORBES Commander, MD;  Location: [facility] ENDOSCOPY;  Service: Endoscopy;   Laterality: N/A;   EP IMPLANTABLE DEVICE N/A 03/08/2015   MDT Fraser Hawthorne CRT-D for nonischemic CM by Dr Kelsie for primary prevention   GLAUCOMA SURGERY Right ~ 2006   put 3 stents in to drain fluid (03/08/2015) not successful, sent to [facility] to try to get last stent out   HOT HEMOSTASIS N/A 08/07/2016   Procedure: HOT HEMOSTASIS (ARGON PLASMA COAGULATION/BICAP);  Surgeon: Lupita FORBES Commander, MD;  Location: [facility];  Service: Endoscopy;  Laterality: N/A;   INCISION AND DRAINAGE ABSCESS POSTERIOR CERVICALSPINE  05/2012   JOINT REPLACEMENT     MELANOMA EXCISION Left 2015   lower leg; right at my knee   REFRACTIVE SURGERY Right ~ 2006 X 2   twice; both done at [facility] (03/08/2015   RIGHT HEART CATH N/A 08/05/2023   Procedure: RIGHT HEART CATH;  Surgeon: Rolan Ezra RAMAN, MD;  Location: [facility] INVASIVE CV LAB;  Service: Cardiovascular;  Laterality: N/A;   SURGERY SCROTAL / TESTICULAR Right 1990's   TEE WITHOUT CARDIOVERSION N/A 01/26/2013   Procedure: TRANSESOPHAGEAL ECHOCARDIOGRAM (TEE);  Surgeon: Redell RAMAN Shallow, MD;  Location: [facility] ENDOSCOPY;  Service: Cardiovascular;  Laterality: N/A;  [name] anes. /    TEE WITHOUT CARDIOVERSION N/A 10/05/2014   Procedure: TRANSESOPHAGEAL ECHOCARDIOGRAM (TEE)  with cardioversion;  Surgeon: Aleene JINNY Passe, MD;  Location: [facility] ENDOSCOPY;  Service: Cardiovascular;  Laterality: N/A;  12:52 synched cardioversion at 120 joules,...afib to SR...12 lead EKG ordered.SABRASABRACardiozem d/c'ed per MD verbal order at SR   TEE WITHOUT CARDIOVERSION N/A 03/23/2015   Procedure: TRANSESOPHAGEAL ECHOCARDIOGRAM (TEE);  Surgeon: Oneil BROCKS Parchment, MD;  Location: [facility] ENDOSCOPY;  Service: Cardiovascular;  Laterality: N/A;   TEE WITHOUT CARDIOVERSION N/A 11/02/2019   Procedure: TRANSESOPHAGEAL ECHOCARDIOGRAM (TEE);  Surgeon: Delford Maude BROCKS, MD;  Location: [facility] ENDOSCOPY;  Service: Cardiovascular;  Laterality: N/A;   TEE WITHOUT CARDIOVERSION N/A 08/05/2023   Procedure: TRANSESOPHAGEAL ECHOCARDIOGRAM;  Surgeon:  Rolan Ezra RAMAN, MD;  Location: [facility] INVASIVE CV LAB;  Service: Cardiovascular;  Laterality: N/A;   THORACIC SPINE SURGERY  03/2000   ground calcium  deposits from upper thoracic (01/26/2013)   THYROIDECTOMY N/A 05/11/2021   Procedure: TOTAL THYROIDECTOMY;  Surgeon: Eletha Boas, MD;  Location: [facility] ORS;  Service: General;  Laterality: N/A;   TOTAL HIP ARTHROPLASTY Right 06/2007   TRANSCATHETER MITRAL EDGE TO EDGE REPAIR N/A 08/21/2023   Procedure: TRANSCATHETER MITRAL EDGE TO EDGE REPAIR;  Surgeon: Thukkani, Arun K, MD;  Location: [facility] INVASIVE CV LAB;  Service: Cardiovascular;  Laterality: N/A;   TRANSESOPHAGEAL ECHOCARDIOGRAM (CATH LAB) N/A 08/09/2023   Procedure: TRANSESOPHAGEAL ECHOCARDIOGRAM;  Surgeon: Rolan Ezra RAMAN, MD;  Location: [facility] INVASIVE CV LAB;  Service: Cardiovascular;  Laterality: N/A;   TRANSESOPHAGEAL ECHOCARDIOGRAM (CATH LAB) N/A 08/21/2023   Procedure: TRANSESOPHAGEAL ECHOCARDIOGRAM;  Surgeon: Wendel Lurena POUR, MD;  Location: [facility] INVASIVE CV LAB;  Service: Cardiovascular;  Laterality: N/A;   Patient Active Problem List   Diagnosis Date Noted   Vitamin B12 deficiency 04/20/2024   S/P mitral valve clip implantation 08/22/2023   Severe mitral regurgitation 08/20/2023   Postsurgical hypothyroidism 11/09/2022   Hypocalcemia 10/24/2021   Hypothyroidism 08/02/2021   Papillary thyroid  carcinoma (HCC) 08/02/2021   Neoplasm of uncertain behavior of thyroid  gland 05/05/2021   Lung nodule 02/15/2021   Right thyroid  nodule 02/15/2021   Atherosclerotic heart disease of native coronary artery without angina pectoris 02/23/2020   Heart failure, unspecified (HCC) 02/23/2020   Biventricular ICD (implantable cardioverter-defibrillator) in place 02/01/2020   Hypercoagulable state due to persistent atrial fibrillation (HCC) 12/01/2019   Statin myopathy 11/16/2019   Peripheral arterial disease 05/20/2019   Melanoma of skin (HCC) 07/28/2018   Benign prostatic hyperplasia 11/02/2016   Iron   deficiency anemia 05/25/2016   OSA (obstructive sleep apnea) 01/04/2016   Heme positive stool    Atrial fibrillation with RVR (HCC) 03/21/2015   Chronic systolic CHF (congestive heart failure) (HCC) 03/08/2015   Hx of adenomatous colonic polyps 02/10/2015   Pancreatic cyst 02/10/2015   Loose stools 02/10/2015   Encounter for monitoring amiodarone  therapy 01/10/2015   Hypotension 10/04/2014   Nonischemic cardiomyopathy (HCC) 10/04/2014   Hyperglycemia 10/04/2014   Troponin level elevated 10/04/2014   Sleep-disordered breathing 10/04/2014   Obesity 10/04/2014   Normocytic anemia 10/04/2014   Thrombocytopenia 10/04/2014   Atrial fibrillation with rapid ventricular response (HCC) 10/02/2014   Sinus bradycardia 10/02/2014   Acute on chronic systolic CHF (congestive heart failure) (HCC) 10/02/2014   Skin cancer 07/21/2014   cardiomyopathy 01/24/2013   Atrial fibrillation (HCC) 01/22/2013   Arthritis    Hyperlipidemia    Hypertension    Unspecified glaucoma 11/18/2012   Infected sebaceous cyst 05/15/2012   REFERRING PROVIDER: Cordella Rhein MD  REFERRING DIAG: Acute right-sided low back pain.  Rationale for Evaluation and Treatment: Rehabilitation  THERAPY DIAG:  Other low back pain  Muscle spasm of back  Abnormal posture  ONSET DATE: 3 weeks+.    SUBJECTIVE:                                                                                                                                                                                           SUBJECTIVE STATEMENT: Pain around a 6.  Better than when I first started. PERTINENT HISTORY:  Pacemaker.  Right THA.  PAIN:  Are you having pain? Yes: NPRS scale: 6/10.   Pain location: Right low back and buttock.   Pain description: As above.   Aggravating factors: As above.  Relieving factors: As above.    PRECAUTIONS: Other: Recommended he use a cane for safety and help assist with improving his posture.    RED  FLAGS: None   WEIGHT BEARING RESTRICTIONS: No  FALLS:  Has patient fallen in last 6 months? Yes. Number of falls Patient states he was getting out of his car to come in her his PT treatment and was paying attention to the curb and tripped on it and fell.  He reported no injury.    LIVING ENVIRONMENT: Lives with: lives with their spouse Lives in: House/apartment Has following equipment at home: None  OCCUPATION: Retired.    PLOF: Independent  PATIENT GOALS: Just being able to function more.   OBJECTIVE:  Note: Objective measures were completed at Evaluation unless otherwise noted.  DIAGNOSTIC FINDINGS:  X-ray:  Ankylosis of multiple areas of his low back   PATIENT SURVEYS:  ODI score:  30/50.  POSTURE: rounded shoulders, forward head, decreased lumbar lordosis, and flexed trunk , right lateral trunk flexion.    PALPATION: TTP right low back and with overpressure at L4-S1 spinous processes.  LUMBAR ROM:  Active trunk flexion limited by 75% and extension to 0 degrees.   LOWER EXTREMITY MMT:   Decreased right hip flexion strength (4-/5) likely due pain.     GAIT: Very antalgic with a flexed and right lateral flexed trunk posture and decrease foot clearance.  Recommended to patient he use his cane.    TREATMENT DATE:    09/03/24:                                       EXERCISE LOG  Exercise Repetitions and Resistance Comments  Nustep Level 3 x 15 minutes   Back ext machine 40# x 4 minutes   Ab machine 40# x 4 minutes           Patient in right sdly position with folded pillow between knees for comfort while receiving STW/M with gentle PA mobs to lower lower vertebrae x 15 minutes.      09/01/24                                 EXERCISE LOG  Exercise Repetitions and Resistance Comments  Nustep Lvl 3 x 15 mins   Lumbar ext x10   Standing L stretch X30   Standing L stretch into lateral flexion X30 R only   Sitting piriformis stretch X30 R&L   Sitting  figure 4 stretch  X30 R&L   Sitting QL stretch X30 R&L   Sitting lateral trunk flexion with pball x15   Sitting pball press down ab set x15   EOB transfers sit to sidelying and then sidelying to sit  Cues for core bracing   Blank cell = exercise not performed today   Manual Therapy Soft Tissue Mobilization attempted but pt reports minimal tenderness today after lumbar stretches in standing     08/26/24                                 EXERCISE LOG  Exercise Repetitions and Resistance Comments  Nustep Lvl 2 x 12 mins        Blank cell = exercise not performed today   Manual Therapy Soft Tissue Mobilization: Right  low back, STW/M to right lumbar paraspinals to decrease pain and tone with pt seated in chair   Patient in left sdly position with pillow bettwen knees while receiving STW/M x 9 minutes f/b HMP x 15 minutes with patient in supine with leg elevator for comfort.                                                                                                                                   PATIENT EDUCATION:  Education details: Magazine features editor. Person educated: Patient Education method: Explanation Education comprehension: verbalized understanding  HOME EXERCISE PROGRAM: Access Code: [email or web address] URL: [email or web address] Date: 09/01/2024 Prepared by: Gellen April Earnie Starring  Exercises - Standing Lumbar Extension with Counter  - 1 x daily - 7 x weekly - 1 sets - 10 reps - Seated Piriformis Stretch  - 1 x daily - 7 x weekly - 2 sets - 30 sec hold - Seated Piriformis Stretch  - 1 x daily - 7 x weekly - 2 sets - 30 sec hold - Seated Abdominal Press into [employer]  - 1 x daily - 7 x weekly - 2 sets - 10 reps - 3 sec hold  ASSESSMENT:  CLINICAL IMPRESSION: Patient did very well with the introduction of the ab and back extension machine.  He found STW/M  with gentle PA mobs to his lower lower vertebrae and felt better after treatment.   OBJECTIVE  IMPAIRMENTS: Abnormal gait, decreased activity tolerance, decreased mobility, difficulty walking, decreased ROM, decreased strength, increased muscle spasms, postural dysfunction, and pain.   ACTIVITY LIMITATIONS: carrying, lifting, bending, sitting, squatting, sleeping, bed mobility, dressing, and locomotion level  PARTICIPATION LIMITATIONS: meal prep, cleaning, laundry, driving, community activity, and yard work  PERSONAL FACTORS: 1-2 comorbidities: pacemaker, right total hip replacement are also affecting patient's functional outcome.   REHAB POTENTIAL: Good  CLINICAL DECISION MAKING: Evolving/moderate complexity  EVALUATION COMPLEXITY: Moderate   GOALS:   LONG TERM GOALS: Target date: 10/01/24  Ind with a HEP.  Goal status: INITIAL  2.  Transition from sit to stand with pain not > 3/10.  Goal status: INITIAL  3.  Roll over in bed with pain not > 3/10.  Goal status: INITIAL  4.  Perform ADL's with pain not > 3/10.  Goal status: INITIAL  5.  Improve ODI score by at least 10 points.  Goal status: INITIAL  PLAN:  PT FREQUENCY: 2x/week  PT DURATION: 6 weeks  PLANNED INTERVENTIONS: 97110-Therapeutic exercises, 97530- Therapeutic activity, [id number]- Neuromuscular re-education, 97535- Self Care, [id number]- Manual therapy, and Patient/Family education.  PLAN FOR NEXT SESSION: STW/M, core exercise progression. Review core bracing with log rolls in/out of bed. Hip/lumbar stretching and strengthening.   Cari Burgo, PT, DPT 09/03/2024, 6:14 PM

## 2024-09-08 ENCOUNTER — Ambulatory Visit

## 2024-09-08 DIAGNOSIS — M5459 Other low back pain: Secondary | ICD-10-CM

## 2024-09-08 DIAGNOSIS — M62830 Muscle spasm of back: Secondary | ICD-10-CM

## 2024-09-08 DIAGNOSIS — R293 Abnormal posture: Secondary | ICD-10-CM

## 2024-09-08 NOTE — Therapy (Signed)
 OUTPATIENT PHYSICAL THERAPY THORACOLUMBAR TREATMENT    Patient Name: Chad Brown MRN: [id number] DOB:03-15-1941, 83 y.o., male Today's Date: 09/08/2024  END OF SESSION:  PT End of Session - 09/08/24 1357     Visit Number 5    Number of Visits 12    Date for Recertification  10/01/24    PT Start Time 1345    PT Stop Time 1429    PT Time Calculation (min) 44 min    Activity Tolerance Patient tolerated treatment well    Behavior During Therapy [facility] for tasks assessed/performed            Past Medical History:  Diagnosis Date   Adenomatous colon polyp    tubular   AICD (automatic cardioverter/defibrillator) present 03/08/2015   MDT CRTD dual pacemaker and defib   Anemia    iron  deficient   Arthritis    about all my joints; hands, knees, back (03/08/2015)   Atherosclerosis    Cataract    left eye small   Cholelithiasis    gallstones   Chronic systolic CHF (congestive heart failure) (HCC)    a. New dx 12/2012 ? NICM, may be r/t afib. b. Nuc 03/2013 - normal;  c. 03/2015 TEE EF 15-20%.   Colon polyp, hyperplastic 01/2015   removed precancerous lesions   Depression    Diverticulosis    Dysrhythmia    afib   GERD (gastroesophageal reflux disease)    Glaucoma    right eye   Hyperlipidemia    Hypertension    Melanoma of eye (HCC) 2000's   right; it's never been biopsied   Melanoma of lower leg (HCC) 2015   left; right at my knee   Myocardial infarction (HCC) 1998   OSA (obstructive sleep apnea) 01/04/2016   no longer tolerates cpap   Peripheral vision loss, right 2006   Persistent atrial fibrillation (HCC)    a. Dx 12/2012, s/p TEE/DCCV 01/26/13. b. On Xarelto  (CHA2DS2VASc = 3);  c. 03/2015 TEE (EF 15-20%, no LAA thrombus) and DCCV - amio increased to 200 mg bid.   Pneumonia    S/P mitral valve clip implantation 08/21/2023   Placement of 1 XTW in the A2/P2 position and 1 NTW in the A1/P1 position by Dr. Wendel and Dr. Wonda   Urinary hesitancy due to benign  prostatic hypertrophy    Past Surgical History:  Procedure Laterality Date   ATRIAL FIBRILLATION ABLATION N/A 11/03/2019   Procedure: ATRIAL FIBRILLATION ABLATION;  Surgeon: Kelsie Agent, MD;  Location: [facility] INVASIVE CV LAB;  Service: Cardiovascular;  Laterality: N/A;   ATRIAL FIBRILLATION ABLATION N/A 02/13/2024   Procedure: ATRIAL FIBRILLATION ABLATION;  Surgeon: Inocencio Soyla Lunger, MD;  Location: [facility] INVASIVE CV LAB;  Service: Cardiovascular;  Laterality: N/A;   AV NODE ABLATION N/A 07/31/2024   Procedure: AV NODE ABLATION;  Surgeon: Inocencio Soyla Lunger, MD;  Location: [facility] INVASIVE CV LAB;  Service: Cardiovascular;  Laterality: N/A;   BACK SURGERY     upper back, cannot turn neck well   BIV ICD GENERATOR CHANGEOUT N/A 09/16/2023   Procedure: BIV ICD GENERATOR CHANGEOUT;  Surgeon: Inocencio Soyla Lunger, MD;  Location: [facility] INVASIVE CV LAB;  Service: Cardiovascular;  Laterality: N/A;   CARDIAC CATHETERIZATION  1998   CARDIOVERSION N/A 01/26/2013   Procedure: CARDIOVERSION;  Surgeon: Redell GORMAN Shallow, MD;  Location: [facility] ENDOSCOPY;  Service: Cardiovascular;  Laterality: N/A;   CARDIOVERSION N/A 03/23/2015   Procedure: CARDIOVERSION;  Surgeon: Oneil JAYSON Parchment, MD;  Location: [facility]  ENDOSCOPY;  Service: Cardiovascular;  Laterality: N/A;   CARDIOVERSION N/A 08/14/2017   Procedure: CARDIOVERSION;  Surgeon: Delford Maude BROCKS, MD;  Location: [facility] ENDOSCOPY;  Service: Cardiovascular;  Laterality: N/A;   CATARACT EXTRACTION Right ~ 2006   COLONOSCOPY WITH PROPOFOL  N/A 02/10/2015   Procedure: COLONOSCOPY WITH PROPOFOL ;  Surgeon: Lupita FORBES Commander, MD;  Location: [facility] ENDOSCOPY;  Service: Endoscopy;  Laterality: N/A;   COLONOSCOPY WITH PROPOFOL  N/A 08/07/2016   Procedure: COLONOSCOPY WITH PROPOFOL ;  Surgeon: Lupita FORBES Commander, MD;  Location: [facility] ENDOSCOPY;  Service: Endoscopy;  Laterality: N/A;   ENTEROSCOPY N/A 08/17/2015   Procedure: ENTEROSCOPY;  Surgeon: Lupita FORBES Commander, MD;  Location: [facility] ENDOSCOPY;  Service: Endoscopy;   Laterality: N/A;   EP IMPLANTABLE DEVICE N/A 03/08/2015   MDT Fraser Hawthorne CRT-D for nonischemic CM by Dr Kelsie for primary prevention   GLAUCOMA SURGERY Right ~ 2006   put 3 stents in to drain fluid (03/08/2015) not successful, sent to [facility] to try to get last stent out   HOT HEMOSTASIS N/A 08/07/2016   Procedure: HOT HEMOSTASIS (ARGON PLASMA COAGULATION/BICAP);  Surgeon: Lupita FORBES Commander, MD;  Location: [facility] ENDOSCOPY;  Service: Endoscopy;  Laterality: N/A;   INCISION AND DRAINAGE ABSCESS POSTERIOR CERVICALSPINE  05/2012   JOINT REPLACEMENT     MELANOMA EXCISION Left 2015   lower leg; right at my knee   REFRACTIVE SURGERY Right ~ 2006 X 2   twice; both done at [facility] (03/08/2015   RIGHT HEART CATH N/A 08/05/2023   Procedure: RIGHT HEART CATH;  Surgeon: Rolan Ezra RAMAN, MD;  Location: [facility] INVASIVE CV LAB;  Service: Cardiovascular;  Laterality: N/A;   SURGERY SCROTAL / TESTICULAR Right 1990's   TEE WITHOUT CARDIOVERSION N/A 01/26/2013   Procedure: TRANSESOPHAGEAL ECHOCARDIOGRAM (TEE);  Surgeon: Redell RAMAN Shallow, MD;  Location: [facility] ENDOSCOPY;  Service: Cardiovascular;  Laterality: N/A;  [name] anes. /    TEE WITHOUT CARDIOVERSION N/A 10/05/2014   Procedure: TRANSESOPHAGEAL ECHOCARDIOGRAM (TEE)  with cardioversion;  Surgeon: Aleene JINNY Passe, MD;  Location: [facility] ENDOSCOPY;  Service: Cardiovascular;  Laterality: N/A;  12:52 synched cardioversion at 120 joules,...afib to SR...12 lead EKG ordered.SABRASABRACardiozem d/c'ed per MD verbal order at SR   TEE WITHOUT CARDIOVERSION N/A 03/23/2015   Procedure: TRANSESOPHAGEAL ECHOCARDIOGRAM (TEE);  Surgeon: Oneil BROCKS Parchment, MD;  Location: [facility] ENDOSCOPY;  Service: Cardiovascular;  Laterality: N/A;   TEE WITHOUT CARDIOVERSION N/A 11/02/2019   Procedure: TRANSESOPHAGEAL ECHOCARDIOGRAM (TEE);  Surgeon: Delford Maude BROCKS, MD;  Location: [facility] ENDOSCOPY;  Service: Cardiovascular;  Laterality: N/A;   TEE WITHOUT CARDIOVERSION N/A 08/05/2023   Procedure: TRANSESOPHAGEAL ECHOCARDIOGRAM;  Surgeon:  Rolan Ezra RAMAN, MD;  Location: [facility] INVASIVE CV LAB;  Service: Cardiovascular;  Laterality: N/A;   THORACIC SPINE SURGERY  03/2000   ground calcium  deposits from upper thoracic (01/26/2013)   THYROIDECTOMY N/A 05/11/2021   Procedure: TOTAL THYROIDECTOMY;  Surgeon: Eletha Boas, MD;  Location: [facility] ORS;  Service: General;  Laterality: N/A;   TOTAL HIP ARTHROPLASTY Right 06/2007   TRANSCATHETER MITRAL EDGE TO EDGE REPAIR N/A 08/21/2023   Procedure: TRANSCATHETER MITRAL EDGE TO EDGE REPAIR;  Surgeon: Thukkani, Arun K, MD;  Location: [facility] INVASIVE CV LAB;  Service: Cardiovascular;  Laterality: N/A;   TRANSESOPHAGEAL ECHOCARDIOGRAM (CATH LAB) N/A 08/09/2023   Procedure: TRANSESOPHAGEAL ECHOCARDIOGRAM;  Surgeon: Rolan Ezra RAMAN, MD;  Location: [facility] INVASIVE CV LAB;  Service: Cardiovascular;  Laterality: N/A;   TRANSESOPHAGEAL ECHOCARDIOGRAM (CATH LAB) N/A 08/21/2023   Procedure: TRANSESOPHAGEAL ECHOCARDIOGRAM;  Surgeon: Wendel Lurena POUR, MD;  Location: [facility] INVASIVE CV LAB;  Service: Cardiovascular;  Laterality: N/A;   Patient Active Problem List   Diagnosis Date Noted   Vitamin B12 deficiency 04/20/2024   S/P mitral valve clip implantation 08/22/2023   Severe mitral regurgitation 08/20/2023   Postsurgical hypothyroidism 11/09/2022   Hypocalcemia 10/24/2021   Hypothyroidism 08/02/2021   Papillary thyroid  carcinoma (HCC) 08/02/2021   Neoplasm of uncertain behavior of thyroid  gland 05/05/2021   Lung nodule 02/15/2021   Right thyroid  nodule 02/15/2021   Atherosclerotic heart disease of native coronary artery without angina pectoris 02/23/2020   Heart failure, unspecified (HCC) 02/23/2020   Biventricular ICD (implantable cardioverter-defibrillator) in place 02/01/2020   Hypercoagulable state due to persistent atrial fibrillation (HCC) 12/01/2019   Statin myopathy 11/16/2019   Peripheral arterial disease 05/20/2019   Melanoma of skin (HCC) 07/28/2018   Benign prostatic hyperplasia 11/02/2016   Iron   deficiency anemia 05/25/2016   OSA (obstructive sleep apnea) 01/04/2016   Heme positive stool    Atrial fibrillation with RVR (HCC) 03/21/2015   Chronic systolic CHF (congestive heart failure) (HCC) 03/08/2015   Hx of adenomatous colonic polyps 02/10/2015   Pancreatic cyst 02/10/2015   Loose stools 02/10/2015   Encounter for monitoring amiodarone  therapy 01/10/2015   Hypotension 10/04/2014   Nonischemic cardiomyopathy (HCC) 10/04/2014   Hyperglycemia 10/04/2014   Troponin level elevated 10/04/2014   Sleep-disordered breathing 10/04/2014   Obesity 10/04/2014   Normocytic anemia 10/04/2014   Thrombocytopenia 10/04/2014   Atrial fibrillation with rapid ventricular response (HCC) 10/02/2014   Sinus bradycardia 10/02/2014   Acute on chronic systolic CHF (congestive heart failure) (HCC) 10/02/2014   Skin cancer 07/21/2014   cardiomyopathy 01/24/2013   Atrial fibrillation (HCC) 01/22/2013   Arthritis    Hyperlipidemia    Hypertension    Unspecified glaucoma 11/18/2012   Infected sebaceous cyst 05/15/2012   REFERRING PROVIDER: Cordella Rhein MD  REFERRING DIAG: Acute right-sided low back pain.  Rationale for Evaluation and Treatment: Rehabilitation  THERAPY DIAG:  Other low back pain  Muscle spasm of back  Abnormal posture  ONSET DATE: 3 weeks+.    SUBJECTIVE:                                                                                                                                                                                           SUBJECTIVE STATEMENT: Pt reports 3-4/10 low back pain while seated and 5-6/10 with ambulation.  PERTINENT HISTORY:  Pacemaker.  Right THA.  PAIN:  Are you having pain? Yes: NPRS scale: 3-6/10   Pain location: Right low back and buttock.   Pain description: As above.   Aggravating factors: As  above.   Relieving factors: As above.    PRECAUTIONS: Other: Recommended he use a cane for safety and help assist with improving his  posture.    RED FLAGS: None   WEIGHT BEARING RESTRICTIONS: No  FALLS:  Has patient fallen in last 6 months? Yes. Number of falls Patient states he was getting out of his car to come in her his PT treatment and was paying attention to the curb and tripped on it and fell.  He reported no injury.    LIVING ENVIRONMENT: Lives with: lives with their spouse Lives in: House/apartment Has following equipment at home: None  OCCUPATION: Retired.    PLOF: Independent  PATIENT GOALS: Just being able to function more.   OBJECTIVE:  Note: Objective measures were completed at Evaluation unless otherwise noted.  DIAGNOSTIC FINDINGS:  X-ray:  Ankylosis of multiple areas of his low back   PATIENT SURVEYS:  ODI score:  30/50.  POSTURE: rounded shoulders, forward head, decreased lumbar lordosis, and flexed trunk , right lateral trunk flexion.    PALPATION: TTP right low back and with overpressure at L4-S1 spinous processes.  LUMBAR ROM:  Active trunk flexion limited by 75% and extension to 0 degrees.   LOWER EXTREMITY MMT:   Decreased right hip flexion strength (4-/5) likely due pain.     GAIT: Very antalgic with a flexed and right lateral flexed trunk posture and decrease foot clearance.  Recommended to patient he use his cane.    TREATMENT DATE:   09/08/24:                                    EXERCISE LOG  Exercise Repetitions and Resistance Comments  Nustep Level 3 x 15 minutes   Back ext machine 40# x 4 minutes   Ab machine 40# x 4 minutes   Cybex Leg Press         Manual Therapy Soft Tissue Mobilization: Low Back, STW/M to bil lumbar paraspinals to decrease pain and tone with pt in right side-lying for comfort     09/01/24                                 EXERCISE LOG  Exercise Repetitions and Resistance Comments  Nustep Lvl 3 x 15 mins   Lumbar ext x10   Standing L stretch X30   Standing L stretch into lateral flexion X30 R only   Sitting piriformis stretch X30  R&L   Sitting figure 4 stretch  X30 R&L   Sitting QL stretch X30 R&L   Sitting lateral trunk flexion with pball x15   Sitting pball press down ab set x15   EOB transfers sit to sidelying and then sidelying to sit  Cues for core bracing   Blank cell = exercise not performed today   Manual Therapy Soft Tissue Mobilization attempted but pt reports minimal tenderness today after lumbar stretches in standing     08/26/24                                 EXERCISE LOG  Exercise Repetitions and Resistance Comments  Nustep Lvl 2 x 12 mins        Blank cell = exercise not performed today   Manual Therapy Soft Tissue Mobilization: Right low  back, STW/M to right lumbar paraspinals to decrease pain and tone with pt seated in chair   Patient in left sdly position with pillow bettwen knees while receiving STW/M x 9 minutes f/b HMP x 15 minutes with patient in supine with leg elevator for comfort.                                                                                                                                   PATIENT EDUCATION:  Education details: Magazine features editor. Person educated: Patient Education method: Explanation Education comprehension: verbalized understanding  HOME EXERCISE PROGRAM: Access Code: [email or web address] URL: [email or web address] Date: 09/01/2024 Prepared by: Gellen April Earnie Starring  Exercises - Standing Lumbar Extension with Counter  - 1 x daily - 7 x weekly - 1 sets - 10 reps - Seated Piriformis Stretch  - 1 x daily - 7 x weekly - 2 sets - 30 sec hold - Seated Piriformis Stretch  - 1 x daily - 7 x weekly - 2 sets - 30 sec hold - Seated Abdominal Press into [employer]  - 1 x daily - 7 x weekly - 2 sets - 10 reps - 3 sec hold  ASSESSMENT:  CLINICAL IMPRESSION: Pt arrives for today's treatment session reporting 3-4/10 low back pain while seated and 5-6/10 with ambulation.  Pt able to tolerate increased time with cybex machinery today.  STW/M  performed to bil lumbar paraspinals to decrease pain and tone.  Pt with difficulty and increased pain with all transfers.  Pt has made minimal progress towards his goals since beginning therapy.  Pt has MD appointment tomorrow to discuss next step in process.  Pt denied any change in pain at completion of today's treatment session.  OBJECTIVE IMPAIRMENTS: Abnormal gait, decreased activity tolerance, decreased mobility, difficulty walking, decreased ROM, decreased strength, increased muscle spasms, postural dysfunction, and pain.   ACTIVITY LIMITATIONS: carrying, lifting, bending, sitting, squatting, sleeping, bed mobility, dressing, and locomotion level  PARTICIPATION LIMITATIONS: meal prep, cleaning, laundry, driving, community activity, and yard work  PERSONAL FACTORS: 1-2 comorbidities: pacemaker, right total hip replacement are also affecting patient's functional outcome.   REHAB POTENTIAL: Good  CLINICAL DECISION MAKING: Evolving/moderate complexity  EVALUATION COMPLEXITY: Moderate   GOALS:   LONG TERM GOALS: Target date: 10/01/24  Ind with a HEP.  Goal status: IN PROGRESS  2.  Transition from sit to stand with pain not > 3/10.   12/9: 2-3/10 Goal status: IN PROGRESS  3.  Roll over in bed with pain not > 3/10. 12/9: 8/10  Goal status: IN PROGRESS  4.  Perform ADL's with pain not > 3/10. 12/9: depends on activity, averages 6-7/10  Goal status: IN PROGRESS  5.  Improve ODI score by at least 10 points.  Goal status: INITIAL  PLAN:  PT FREQUENCY: 2x/week  PT DURATION: 6 weeks  PLANNED INTERVENTIONS: 97110-Therapeutic exercises, 97530- Therapeutic activity, [id number]- Neuromuscular re-education, 97535- Self Care, [id number]- Manual  therapy, and Patient/Family education.  PLAN FOR NEXT SESSION: STW/M, core exercise progression. Review core bracing with log rolls in/out of bed. Hip/lumbar stretching and strengthening.   Delon DELENA Gosling, PTA, DPT 09/08/2024, 2:47 PM

## 2024-09-10 ENCOUNTER — Ambulatory Visit

## 2024-09-10 DIAGNOSIS — M5459 Other low back pain: Secondary | ICD-10-CM

## 2024-09-10 DIAGNOSIS — M62830 Muscle spasm of back: Secondary | ICD-10-CM

## 2024-09-10 DIAGNOSIS — R293 Abnormal posture: Secondary | ICD-10-CM

## 2024-09-10 NOTE — Therapy (Signed)
 OUTPATIENT PHYSICAL THERAPY THORACOLUMBAR TREATMENT    Patient Name: Chad Brown MRN: [id number] DOB:1941/01/14, 83 y.o., male Today's Date: 09/10/2024  END OF SESSION:  PT End of Session - 09/10/24 1352     Visit Number 6    Number of Visits 12    Date for Recertification  10/01/24    PT Start Time 1345    PT Stop Time 1429    PT Time Calculation (min) 44 min    Activity Tolerance Patient tolerated treatment well    Behavior During Therapy [facility] for tasks assessed/performed            Past Medical History:  Diagnosis Date   Adenomatous colon polyp    tubular   AICD (automatic cardioverter/defibrillator) present 03/08/2015   MDT CRTD dual pacemaker and defib   Anemia    iron  deficient   Arthritis    about all my joints; hands, knees, back (03/08/2015)   Atherosclerosis    Cataract    left eye small   Cholelithiasis    gallstones   Chronic systolic CHF (congestive heart failure) (HCC)    a. New dx 12/2012 ? NICM, may be r/t afib. b. Nuc 03/2013 - normal;  c. 03/2015 TEE EF 15-20%.   Colon polyp, hyperplastic 01/2015   removed precancerous lesions   Depression    Diverticulosis    Dysrhythmia    afib   GERD (gastroesophageal reflux disease)    Glaucoma    right eye   Hyperlipidemia    Hypertension    Melanoma of eye (HCC) 2000's   right; it's never been biopsied   Melanoma of lower leg (HCC) 2015   left; right at my knee   Myocardial infarction (HCC) 1998   OSA (obstructive sleep apnea) 01/04/2016   no longer tolerates cpap   Peripheral vision loss, right 2006   Persistent atrial fibrillation (HCC)    a. Dx 12/2012, s/p TEE/DCCV 01/26/13. b. On Xarelto  (CHA2DS2VASc = 3);  c. 03/2015 TEE (EF 15-20%, no LAA thrombus) and DCCV - amio increased to 200 mg bid.   Pneumonia    S/P mitral valve clip implantation 08/21/2023   Placement of 1 XTW in the A2/P2 position and 1 NTW in the A1/P1 position by Dr. Wendel and Dr. Wonda   Urinary hesitancy due to benign  prostatic hypertrophy    Past Surgical History:  Procedure Laterality Date   ATRIAL FIBRILLATION ABLATION N/A 11/03/2019   Procedure: ATRIAL FIBRILLATION ABLATION;  Surgeon: Kelsie Agent, MD;  Location: [facility] INVASIVE CV LAB;  Service: Cardiovascular;  Laterality: N/A;   ATRIAL FIBRILLATION ABLATION N/A 02/13/2024   Procedure: ATRIAL FIBRILLATION ABLATION;  Surgeon: Inocencio Soyla Lunger, MD;  Location: [facility] CV [facility];  Service: Cardiovascular;  Laterality: N/A;   AV NODE ABLATION N/A 07/31/2024   Procedure: AV NODE ABLATION;  Surgeon: Inocencio Soyla Lunger, MD;  Location: [facility];  Service: Cardiovascular;  Laterality: N/A;   BACK SURGERY     upper back, cannot turn neck well   BIV ICD GENERATOR CHANGEOUT N/A 09/16/2023   Procedure: BIV ICD GENERATOR CHANGEOUT;  Surgeon: Inocencio Soyla Lunger, MD;  Location: [facility] INVASIVE CV LAB;  Service: Cardiovascular;  Laterality: N/A;   CARDIAC CATHETERIZATION  1998   CARDIOVERSION N/A 01/26/2013   Procedure: CARDIOVERSION;  Surgeon: Redell GORMAN Shallow, MD;  Location: [facility] ENDOSCOPY;  Service: Cardiovascular;  Laterality: N/A;   CARDIOVERSION N/A 03/23/2015   Procedure: CARDIOVERSION;  Surgeon: Oneil JAYSON Parchment, MD;  Location: [facility]  ENDOSCOPY;  Service: Cardiovascular;  Laterality: N/A;   CARDIOVERSION N/A 08/14/2017   Procedure: CARDIOVERSION;  Surgeon: Delford Maude BROCKS, MD;  Location: [facility] ENDOSCOPY;  Service: Cardiovascular;  Laterality: N/A;   CATARACT EXTRACTION Right ~ 2006   COLONOSCOPY WITH PROPOFOL  N/A 02/10/2015   Procedure: COLONOSCOPY WITH PROPOFOL ;  Surgeon: Lupita FORBES Commander, MD;  Location: [facility] ENDOSCOPY;  Service: Endoscopy;  Laterality: N/A;   COLONOSCOPY WITH PROPOFOL  N/A 08/07/2016   Procedure: COLONOSCOPY WITH PROPOFOL ;  Surgeon: Lupita FORBES Commander, MD;  Location: [facility] ENDOSCOPY;  Service: Endoscopy;  Laterality: N/A;   ENTEROSCOPY N/A 08/17/2015   Procedure: ENTEROSCOPY;  Surgeon: Lupita FORBES Commander, MD;  Location: [facility] ENDOSCOPY;  Service: Endoscopy;   Laterality: N/A;   EP IMPLANTABLE DEVICE N/A 03/08/2015   MDT Fraser Hawthorne CRT-D for nonischemic CM by Dr Kelsie for primary prevention   GLAUCOMA SURGERY Right ~ 2006   put 3 stents in to drain fluid (03/08/2015) not successful, sent to [facility] to try to get last stent out   HOT HEMOSTASIS N/A 08/07/2016   Procedure: HOT HEMOSTASIS (ARGON PLASMA COAGULATION/BICAP);  Surgeon: Lupita FORBES Commander, MD;  Location: [facility];  Service: Endoscopy;  Laterality: N/A;   INCISION AND DRAINAGE ABSCESS POSTERIOR CERVICALSPINE  05/2012   JOINT REPLACEMENT     MELANOMA EXCISION Left 2015   lower leg; right at my knee   REFRACTIVE SURGERY Right ~ 2006 X 2   twice; both done at [facility] (03/08/2015   RIGHT HEART CATH N/A 08/05/2023   Procedure: RIGHT HEART CATH;  Surgeon: Rolan Ezra RAMAN, MD;  Location: [facility] INVASIVE CV LAB;  Service: Cardiovascular;  Laterality: N/A;   SURGERY SCROTAL / TESTICULAR Right 1990's   TEE WITHOUT CARDIOVERSION N/A 01/26/2013   Procedure: TRANSESOPHAGEAL ECHOCARDIOGRAM (TEE);  Surgeon: Redell RAMAN Shallow, MD;  Location: [facility] ENDOSCOPY;  Service: Cardiovascular;  Laterality: N/A;  [name] anes. /    TEE WITHOUT CARDIOVERSION N/A 10/05/2014   Procedure: TRANSESOPHAGEAL ECHOCARDIOGRAM (TEE)  with cardioversion;  Surgeon: Aleene JINNY Passe, MD;  Location: [facility] ENDOSCOPY;  Service: Cardiovascular;  Laterality: N/A;  12:52 synched cardioversion at 120 joules,...afib to SR...12 lead EKG ordered.SABRASABRACardiozem d/c'ed per MD verbal order at SR   TEE WITHOUT CARDIOVERSION N/A 03/23/2015   Procedure: TRANSESOPHAGEAL ECHOCARDIOGRAM (TEE);  Surgeon: Oneil BROCKS Parchment, MD;  Location: [facility] ENDOSCOPY;  Service: Cardiovascular;  Laterality: N/A;   TEE WITHOUT CARDIOVERSION N/A 11/02/2019   Procedure: TRANSESOPHAGEAL ECHOCARDIOGRAM (TEE);  Surgeon: Delford Maude BROCKS, MD;  Location: [facility] ENDOSCOPY;  Service: Cardiovascular;  Laterality: N/A;   TEE WITHOUT CARDIOVERSION N/A 08/05/2023   Procedure: TRANSESOPHAGEAL ECHOCARDIOGRAM;  Surgeon:  Rolan Ezra RAMAN, MD;  Location: [facility] INVASIVE CV LAB;  Service: Cardiovascular;  Laterality: N/A;   THORACIC SPINE SURGERY  03/2000   ground calcium  deposits from upper thoracic (01/26/2013)   THYROIDECTOMY N/A 05/11/2021   Procedure: TOTAL THYROIDECTOMY;  Surgeon: Eletha Boas, MD;  Location: [facility] ORS;  Service: General;  Laterality: N/A;   TOTAL HIP ARTHROPLASTY Right 06/2007   TRANSCATHETER MITRAL EDGE TO EDGE REPAIR N/A 08/21/2023   Procedure: TRANSCATHETER MITRAL EDGE TO EDGE REPAIR;  Surgeon: Thukkani, Arun K, MD;  Location: [facility] INVASIVE CV LAB;  Service: Cardiovascular;  Laterality: N/A;   TRANSESOPHAGEAL ECHOCARDIOGRAM (CATH LAB) N/A 08/09/2023   Procedure: TRANSESOPHAGEAL ECHOCARDIOGRAM;  Surgeon: Rolan Ezra RAMAN, MD;  Location: [facility] INVASIVE CV LAB;  Service: Cardiovascular;  Laterality: N/A;   TRANSESOPHAGEAL ECHOCARDIOGRAM (CATH LAB) N/A 08/21/2023   Procedure: TRANSESOPHAGEAL ECHOCARDIOGRAM;  Surgeon: Wendel Lurena POUR, MD;  Location: [facility] INVASIVE CV LAB;  Service: Cardiovascular;  Laterality: N/A;   Patient Active Problem List   Diagnosis Date Noted   Vitamin B12 deficiency 04/20/2024   S/P mitral valve clip implantation 08/22/2023   Severe mitral regurgitation 08/20/2023   Postsurgical hypothyroidism 11/09/2022   Hypocalcemia 10/24/2021   Hypothyroidism 08/02/2021   Papillary thyroid  carcinoma (HCC) 08/02/2021   Neoplasm of uncertain behavior of thyroid  gland 05/05/2021   Lung nodule 02/15/2021   Right thyroid  nodule 02/15/2021   Atherosclerotic heart disease of native coronary artery without angina pectoris 02/23/2020   Heart failure, unspecified (HCC) 02/23/2020   Biventricular ICD (implantable cardioverter-defibrillator) in place 02/01/2020   Hypercoagulable state due to persistent atrial fibrillation (HCC) 12/01/2019   Statin myopathy 11/16/2019   Peripheral arterial disease 05/20/2019   Melanoma of skin (HCC) 07/28/2018   Benign prostatic hyperplasia 11/02/2016   Iron   deficiency anemia 05/25/2016   OSA (obstructive sleep apnea) 01/04/2016   Heme positive stool    Atrial fibrillation with RVR (HCC) 03/21/2015   Chronic systolic CHF (congestive heart failure) (HCC) 03/08/2015   Hx of adenomatous colonic polyps 02/10/2015   Pancreatic cyst 02/10/2015   Loose stools 02/10/2015   Encounter for monitoring amiodarone  therapy 01/10/2015   Hypotension 10/04/2014   Nonischemic cardiomyopathy (HCC) 10/04/2014   Hyperglycemia 10/04/2014   Troponin level elevated 10/04/2014   Sleep-disordered breathing 10/04/2014   Obesity 10/04/2014   Normocytic anemia 10/04/2014   Thrombocytopenia 10/04/2014   Atrial fibrillation with rapid ventricular response (HCC) 10/02/2014   Sinus bradycardia 10/02/2014   Acute on chronic systolic CHF (congestive heart failure) (HCC) 10/02/2014   Skin cancer 07/21/2014   cardiomyopathy 01/24/2013   Atrial fibrillation (HCC) 01/22/2013   Arthritis    Hyperlipidemia    Hypertension    Unspecified glaucoma 11/18/2012   Infected sebaceous cyst 05/15/2012   REFERRING PROVIDER: Cordella Rhein MD  REFERRING DIAG: Acute right-sided low back pain.  Rationale for Evaluation and Treatment: Rehabilitation  THERAPY DIAG:  Other low back pain  Muscle spasm of back  Abnormal posture  ONSET DATE: 3 weeks+.    SUBJECTIVE:                                                                                                                                                                                           SUBJECTIVE STATEMENT: Pt reports 2-3/10 low back pain.    PERTINENT HISTORY:  Pacemaker.  Right THA.  PAIN:  Are you having pain? Yes: NPRS scale: 2-3/10   Pain location: Right low back and buttock.   Pain description: As above.   Aggravating factors: As above.   Relieving  factors: As above.    PRECAUTIONS: Other: Recommended he use a cane for safety and help assist with improving his posture.    RED FLAGS: None   WEIGHT  BEARING RESTRICTIONS: No  FALLS:  Has patient fallen in last 6 months? Yes. Number of falls Patient states he was getting out of his car to come in her his PT treatment and was paying attention to the curb and tripped on it and fell.  He reported no injury.    LIVING ENVIRONMENT: Lives with: lives with their spouse Lives in: House/apartment Has following equipment at home: None  OCCUPATION: Retired.    PLOF: Independent  PATIENT GOALS: Just being able to function more.   OBJECTIVE:  Note: Objective measures were completed at Evaluation unless otherwise noted.  DIAGNOSTIC FINDINGS:  X-ray:  Ankylosis of multiple areas of his low back   PATIENT SURVEYS:  ODI score:  30/50.  POSTURE: rounded shoulders, forward head, decreased lumbar lordosis, and flexed trunk , right lateral trunk flexion.    PALPATION: TTP right low back and with overpressure at L4-S1 spinous processes.  LUMBAR ROM:  Active trunk flexion limited by 75% and extension to 0 degrees.   LOWER EXTREMITY MMT:   Decreased right hip flexion strength (4-/5) likely due pain.     GAIT: Very antalgic with a flexed and right lateral flexed trunk posture and decrease foot clearance.  Recommended to patient he use his cane.    TREATMENT DATE:   09/08/24:                                    EXERCISE LOG  Exercise Repetitions and Resistance Comments  Nustep Level 3 x 17 minutes   Back ext machine 50# x 4 minutes   Ab machine 50# x 4 minutes   Cybex Leg Press         Manual Therapy Soft Tissue Mobilization: Low Back, STW/M to bil lumbar paraspinals to decrease pain and tone with pt seated in chair leaning over elevated table     09/01/24                                 EXERCISE LOG  Exercise Repetitions and Resistance Comments  Nustep Lvl 3 x 15 mins   Lumbar ext x10   Standing L stretch X30   Standing L stretch into lateral flexion X30 R only   Sitting piriformis stretch X30 R&L   Sitting figure 4  stretch  X30 R&L   Sitting QL stretch X30 R&L   Sitting lateral trunk flexion with pball x15   Sitting pball press down ab set x15   EOB transfers sit to sidelying and then sidelying to sit  Cues for core bracing   Blank cell = exercise not performed today   Manual Therapy Soft Tissue Mobilization attempted but pt reports minimal tenderness today after lumbar stretches in standing     08/26/24                                 EXERCISE LOG  Exercise Repetitions and Resistance Comments  Nustep Lvl 2 x 12 mins        Blank cell = exercise not performed today   Manual Therapy Soft Tissue Mobilization: Right low back, STW/M  to right lumbar paraspinals to decrease pain and tone with pt seated in chair   Patient in left sdly position with pillow bettwen knees while receiving STW/M x 9 minutes f/b HMP x 15 minutes with patient in supine with leg elevator for comfort.                                                                                                                                   PATIENT EDUCATION:  Education details: Magazine features editor. Person educated: Patient Education method: Explanation Education comprehension: verbalized understanding  HOME EXERCISE PROGRAM: Access Code: [email or web address] URL: [email or web address] Date: 09/01/2024 Prepared by: Gellen April Earnie Starring  Exercises - Standing Lumbar Extension with Counter  - 1 x daily - 7 x weekly - 1 sets - 10 reps - Seated Piriformis Stretch  - 1 x daily - 7 x weekly - 2 sets - 30 sec hold - Seated Piriformis Stretch  - 1 x daily - 7 x weekly - 2 sets - 30 sec hold - Seated Abdominal Press into [employer]  - 1 x daily - 7 x weekly - 2 sets - 10 reps - 3 sec hold  ASSESSMENT:  CLINICAL IMPRESSION: Pt arrives for today's treatment session reporting 2-3/10 low back pain.  Pt saw the MD yesterday and would like for him to continue therapy at this time. Pt able to tolerate increased weight with cybex lumbar  exercises today without discomfort.   STW/M performed to bil lumbar paraspinals to decrease pain and tone.  Pt reported decreased pain at completion of today's treatment session.   OBJECTIVE IMPAIRMENTS: Abnormal gait, decreased activity tolerance, decreased mobility, difficulty walking, decreased ROM, decreased strength, increased muscle spasms, postural dysfunction, and pain.   ACTIVITY LIMITATIONS: carrying, lifting, bending, sitting, squatting, sleeping, bed mobility, dressing, and locomotion level  PARTICIPATION LIMITATIONS: meal prep, cleaning, laundry, driving, community activity, and yard work  PERSONAL FACTORS: 1-2 comorbidities: pacemaker, right total hip replacement are also affecting patient's functional outcome.   REHAB POTENTIAL: Good  CLINICAL DECISION MAKING: Evolving/moderate complexity  EVALUATION COMPLEXITY: Moderate   GOALS:   LONG TERM GOALS: Target date: 10/01/24  Ind with a HEP.  Goal status: IN PROGRESS  2.  Transition from sit to stand with pain not > 3/10.   12/9: 2-3/10 Goal status: IN PROGRESS  3.  Roll over in bed with pain not > 3/10. 12/9: 8/10  Goal status: IN PROGRESS  4.  Perform ADL's with pain not > 3/10. 12/9: depends on activity, averages 6-7/10  Goal status: IN PROGRESS  5.  Improve ODI score by at least 10 points.  Goal status: INITIAL  PLAN:  PT FREQUENCY: 2x/week  PT DURATION: 6 weeks  PLANNED INTERVENTIONS: 97110-Therapeutic exercises, 97530- Therapeutic activity, [id number]- Neuromuscular re-education, 97535- Self Care, [id number]- Manual therapy, and Patient/Family education.  PLAN FOR NEXT SESSION: STW/M, core exercise progression. Review core bracing with log rolls in/out of bed. Hip/lumbar  stretching and strengthening.   Delon DELENA Gosling, PTA, DPT 09/10/2024, 3:29 PM

## 2024-09-16 ENCOUNTER — Ambulatory Visit

## 2024-09-17 ENCOUNTER — Ambulatory Visit: Admitting: *Deleted

## 2024-09-22 ENCOUNTER — Ambulatory Visit: Admitting: *Deleted

## 2024-09-25 ENCOUNTER — Ambulatory Visit

## 2024-09-25 ENCOUNTER — Ambulatory Visit: Payer: Self-pay | Admitting: Cardiology

## 2024-09-25 DIAGNOSIS — I4819 Other persistent atrial fibrillation: Secondary | ICD-10-CM

## 2024-09-25 LAB — CUP PACEART REMOTE DEVICE CHECK
Battery Remaining Longevity: 96 mo
Battery Voltage: 2.99 V
Brady Statistic RV Percent Paced: 99.95 %
Date Time Interrogation Session: 20251225204132
HighPow Impedance: 76 Ohm
Lead Channel Impedance Value: 1045 Ohm
Lead Channel Impedance Value: 1045 Ohm
Lead Channel Impedance Value: 1102 Ohm
Lead Channel Impedance Value: 1254 Ohm
Lead Channel Impedance Value: 323 Ohm
Lead Channel Impedance Value: 323 Ohm
Lead Channel Impedance Value: 399 Ohm
Lead Channel Impedance Value: 437 Ohm
Lead Channel Impedance Value: 532 Ohm
Lead Channel Impedance Value: 646 Ohm
Lead Channel Impedance Value: 684 Ohm
Lead Channel Impedance Value: 817 Ohm
Lead Channel Impedance Value: 893 Ohm
Lead Channel Pacing Threshold Amplitude: 0.75 V
Lead Channel Pacing Threshold Amplitude: 0.875 V
Lead Channel Pacing Threshold Amplitude: 1 V
Lead Channel Pacing Threshold Pulse Width: 0.4 ms
Lead Channel Pacing Threshold Pulse Width: 0.4 ms
Lead Channel Pacing Threshold Pulse Width: 0.8 ms
Lead Channel Sensing Intrinsic Amplitude: 0.3 mV
Lead Channel Sensing Intrinsic Amplitude: 11.9 mV
Lead Channel Setting Pacing Amplitude: 1.5 V
Lead Channel Setting Pacing Amplitude: 1.75 V
Lead Channel Setting Pacing Amplitude: 2 V
Lead Channel Setting Pacing Pulse Width: 0.4 ms
Lead Channel Setting Pacing Pulse Width: 0.8 ms
Lead Channel Setting Sensing Sensitivity: 0.3 mV
Zone Setting Status: 755011
Zone Setting Status: 755011

## 2024-09-25 NOTE — Progress Notes (Signed)
 Remote ICD Transmission

## 2024-09-28 ENCOUNTER — Ambulatory Visit: Attending: Cardiology

## 2024-09-28 ENCOUNTER — Encounter

## 2024-09-28 DIAGNOSIS — Z95810 Presence of automatic (implantable) cardiac defibrillator: Secondary | ICD-10-CM | POA: Diagnosis not present

## 2024-09-28 DIAGNOSIS — I5022 Chronic systolic (congestive) heart failure: Secondary | ICD-10-CM

## 2024-09-29 ENCOUNTER — Other Ambulatory Visit (HOSPITAL_COMMUNITY): Payer: Self-pay | Admitting: Cardiology

## 2024-09-29 NOTE — Progress Notes (Signed)
 EPIC Encounter for ICM Monitoring  Patient Name: Chad Brown is a 83 y.o. male Date: 09/29/2024 Primary Care Physican: [name], MD Primary Cardiologist: Nishan/McLean Electrophysiologist: Inocencio Pore Pacing: 100 %  10/23/2023 Weight: 198 lbs 11/27/2023 Weight: 200 lbs 02/03/2024 Weight: 203 lbs 01/30/2024 Weight: 206 lbs 03/09/2024 Weight: 206 lbs 03/10/2024 Weight: 204 lbs 04/23/2024 Office Weight: 210 lbs 05/01/2024 Home Weight: 203-206 lbs 07/09/2024 Office Weight: 206.6 lbs 08/19/2024 Weight: 204 lbs (was 207 lbs) 09/29/2024 Weight: 200-202 lbs    Since 24-Sep-2024   Time in AT/AF  24.0 hours/day (100.0 %)    Spoke with patient and heart failure questions reviewed.  Transmission results reviewed.  He reports having back issues and had a couple of falls.  He has not felt good lately but did have a good holiday.    Since 06/02/2024 ICM Remote Transmission: [employer] thoracic impedance suggesting normal fluid levels.     Prescribed:  Torsemide  20 mg take 3 tablets (60 mg total) every morning and 2 tablets (40 mg total) by mouth every evening. Potassium 20 mEq take 1 tablet by mouth daily   Labs: 07/16/2024 Creatinine 2.64, BUN 38, Potassium 5.4, Sodium 142, GFR 23 05/04/2024 Creatinine 2.34, BUN 32,, Potassium 4.0, Sodium 143, GFR 27 04/13/2024 Creatinine 2.37, BUN 42, Potassium 4.3, Sodium 140, GFR 27 01/31/2024 Creatinine 2.74, BUN 40, Potassium 4.5, Sodium 139 01/20/2024 Creatinine 2.94, BUN 50, Potassium 5.0, Sodium 146, GFR 20 A complete set of results can be found in Results Review.   Recommendations:  No changes and encouraged to call if experiencing any fluid symptoms.  He plans on calling Dr Orvilla office today to make Feb recall appointment.    Follow-up plan: ICM clinic phone appointment on 11/02/2024.  91 day device clinic remote transmission 12/25/2024.     EP/Cardiology Next Office Visit:  Recall 08/19/2025 with [name].  Recall 11/06/2024 with Dr Rolan.    Copy of ICM check sent to Dr. Inocencio.     Remote monitoring is medically necessary for Heart Failure Management.    Daily Thoracic Impedance ICM trend: 06/28/2024 through 09/27/2024.    12-14 Month Thoracic Impedance ICM trend:     [name], RN 09/29/2024 1:10 PM

## 2024-10-14 ENCOUNTER — Inpatient Hospital Stay: Attending: Hematology

## 2024-10-14 DIAGNOSIS — D649 Anemia, unspecified: Secondary | ICD-10-CM

## 2024-10-14 DIAGNOSIS — D696 Thrombocytopenia, unspecified: Secondary | ICD-10-CM

## 2024-10-14 LAB — COMPREHENSIVE METABOLIC PANEL WITH GFR
ALT: 21 U/L (ref 0–44)
AST: 17 U/L (ref 15–41)
Albumin: 3.8 g/dL (ref 3.5–5.0)
Alkaline Phosphatase: 131 U/L — ABNORMAL HIGH (ref 38–126)
Anion gap: 15 (ref 5–15)
BUN: 33 mg/dL — ABNORMAL HIGH (ref 8–23)
CO2: 28 mmol/L (ref 22–32)
Calcium: 8.5 mg/dL — ABNORMAL LOW (ref 8.9–10.3)
Chloride: 104 mmol/L (ref 98–111)
Creatinine, Ser: 2.1 mg/dL — ABNORMAL HIGH (ref 0.61–1.24)
GFR, Estimated: 31 mL/min — ABNORMAL LOW
Glucose, Bld: 111 mg/dL — ABNORMAL HIGH (ref 70–99)
Potassium: 3.9 mmol/L (ref 3.5–5.1)
Sodium: 146 mmol/L — ABNORMAL HIGH (ref 135–145)
Total Bilirubin: 0.5 mg/dL (ref 0.0–1.2)
Total Protein: 7.2 g/dL (ref 6.5–8.1)

## 2024-10-14 LAB — CBC WITH DIFFERENTIAL/PLATELET
Abs Immature Granulocytes: 0.02 K/uL (ref 0.00–0.07)
Basophils Absolute: 0.1 K/uL (ref 0.0–0.1)
Basophils Relative: 1 %
Eosinophils Absolute: 0.1 K/uL (ref 0.0–0.5)
Eosinophils Relative: 1 %
HCT: 42.4 % (ref 39.0–52.0)
Hemoglobin: 13.4 g/dL (ref 13.0–17.0)
Immature Granulocytes: 0 %
Lymphocytes Relative: 14 %
Lymphs Abs: 0.9 K/uL (ref 0.7–4.0)
MCH: 32.1 pg (ref 26.0–34.0)
MCHC: 31.6 g/dL (ref 30.0–36.0)
MCV: 101.4 fL — ABNORMAL HIGH (ref 80.0–100.0)
Monocytes Absolute: 0.5 K/uL (ref 0.1–1.0)
Monocytes Relative: 8 %
Neutro Abs: 4.6 K/uL (ref 1.7–7.7)
Neutrophils Relative %: 76 %
Platelets: 183 K/uL (ref 150–400)
RBC: 4.18 MIL/uL — ABNORMAL LOW (ref 4.22–5.81)
RDW: 15.6 % — ABNORMAL HIGH (ref 11.5–15.5)
WBC: 6.1 K/uL (ref 4.0–10.5)
nRBC: 0 % (ref 0.0–0.2)

## 2024-10-14 LAB — VITAMIN B12: Vitamin B-12: 1844 pg/mL — ABNORMAL HIGH (ref 180–914)

## 2024-10-14 LAB — FERRITIN: Ferritin: 207 ng/mL (ref 24–336)

## 2024-10-14 LAB — IRON AND TIBC
Iron: 85 ug/dL (ref 45–182)
Saturation Ratios: 31 % (ref 17.9–39.5)
TIBC: 273 ug/dL (ref 250–450)
UIBC: 188 ug/dL

## 2024-10-14 LAB — FOLATE: Folate: 17.9 ng/mL

## 2024-10-17 NOTE — Progress Notes (Unsigned)
 "  [facility] [address], [place] [address]   CLINIC:  Medical Oncology/Hematology  PCP:  [name], MD [address] [place] [address] [phone]   REASON FOR VISIT:  Follow-up for normocytic anemia and thrombocytopenia   CURRENT THERAPY: Iron  Gummies, B12/folate supplements  INTERVAL HISTORY:   Chad Brown 83y.o. male returns for routine follow-up of anemia and thrombocytopenia.   He was last seen by Dr. Davonna on 04/20/2024.  At today's visit, he reports feeling fairly well.*** ***He denies any interval hospitalizations, surgeries, or major changes in baseline health status. ***He has 65***% energy and 100***% appetite.  ***He reports losing about 10 pounds in fluid weight after starting Lasix  in November.  He is taking iron  supplement daily at home.*** ***He has been taking B12 1000 mcg daily and folic acid  400 mcg daily.  ***He reports mild fatigue. ***He admits to mild cough as well as some diarrhea, which are chronic/intermittent.   ***He denies any B symptoms such as fever, chills, night sweats, unintentional weight loss.    ***No shortness of breath, chest pain, nausea, vomiting, abdominal pain.    ***He has not noticed any recent bleeding events such as hematemesis, hematochezia, or melena.    ***He does bruise easily, but notes that he is also on Xarelto  for atrial fibrillation.  ASSESSMENT & PLAN:  1.  Normocytic anemia # Anemia of CKD stage IIIb/IV # Iron  deficiency anemia # Vitamin B12 deficiency # Folate deficiency - Combination anemia from CKD and relative iron  deficiency. - Colonoscopy (08/07/2016): Polyps x 3 (tubular adenomas), diverticulosis, internal hemorrhoids - Small bowel enteroscopy (08/17/2015): Esophageal polyp, gastritis, scattered xanthomas and small bowel otherwise normal enteroscopy with no cause of blood loss/anemia seen - He takes a daily iron  gummy, B12 1000 mcg, folate 400 mcg*** - No bright  red blood per rectum or melena*** - Most recent labs (10/14/2024): Hgb 13.4/MCV 101.4.  Normal WBC and platelets. Creatinine 2.10/GFR 31 Ferritin 207, iron  saturation 31% Vitamin B12 1844 Folate 17.9  - PLAN: Hemoglobin and iron  at goal. - CONTINUE daily iron  supplement, folic acid  400 mcg daily. - DECREASE vitamin B12 to take 1000 mcg every other day.  *** - Repeat labs and RTC in 6 months  -- Would consider ESA if Hgb <10.0 with normal iron  stores.  Consider IV iron  if TSAT is <20 5-30.   2.  Mild thrombocytopenia: - He has intermittent mild thrombocytopenia for several years duration. -- B12 and folate deficiency noted June 2023 - Most recent labs show normalized platelets, B12, and folate -- DIFFERENTIAL DIAGNOSIS: Thrombocytopenia secondary to nutritional deficiencies, versus mild ITP, versus early MDS - PLAN: Supplementation as above.  Continue surveillance.   3.  Lung nodules: - He is a former smoker, smoked more than 2 packs/day for many years and quit in 2001. - Follows with [facility] for annual CT chest, most recently completed on 09/04/2023   4.  T2 N0 melanoma of the left thigh: - Status post resection in 2015 at [facility].   5.  Papillary thyroid  carcinoma: - Thyroid  surgery by Dr. Eletha (total thyroidectomy 05/11/2021) - He had radioactive iodine  pill in January 2023 under the direction of Dr. Kassie and Dr. Malva - Follows with endocrinologist, Dr. Lenis.   6.  CKD stage IIIb/IV - Follows with Dr. Rachele.   7.  Other history - PMH: Paroxysmal atrial fibrillation (on Xarelto ), hypertension, hyperlipidemia, systolic CHF, sleep apnea, coronary artery disease   PLAN SUMMARY:*** >>  Labs in 6 months  (CBC/D, CMP, ferritin, iron /TIBC, B12, MMA, folate) >> OFFICE *** PHONE *** visit 1 week after labs     REVIEW OF SYSTEMS: ***  Review of Systems - Oncology   PHYSICAL EXAM:  ECOG PERFORMANCE STATUS: {CHL ONC ECOG ED:[id number]} *** There were no vitals  filed for this visit. There were no vitals filed for this visit. Physical Exam  PAST MEDICAL/SURGICAL HISTORY:  Past Medical History:  Diagnosis Date   Adenomatous colon polyp    tubular   AICD (automatic cardioverter/defibrillator) present 03/08/2015   MDT CRTD dual pacemaker and defib   Anemia    iron  deficient   Arthritis    about all my joints; hands, knees, back (03/08/2015)   Atherosclerosis    Cataract    left eye small   Cholelithiasis    gallstones   Chronic systolic CHF (congestive heart failure) (HCC)    a. New dx 12/2012 ? NICM, may be r/t afib. b. Nuc 03/2013 - normal;  c. 03/2015 TEE EF 15-20%.   Colon polyp, hyperplastic 01/2015   removed precancerous lesions   Depression    Diverticulosis    Dysrhythmia    afib   GERD (gastroesophageal reflux disease)    Glaucoma    right eye   Hyperlipidemia    Hypertension    Melanoma of eye (HCC) 2000's   right; it's never been biopsied   Melanoma of lower leg (HCC) 2015   left; right at my knee   Myocardial infarction (HCC) 1998   OSA (obstructive sleep apnea) 01/04/2016   no longer tolerates cpap   Peripheral vision loss, right 2006   Persistent atrial fibrillation (HCC)    a. Dx 12/2012, s/p TEE/DCCV 01/26/13. b. On Xarelto  (CHA2DS2VASc = 3);  c. 03/2015 TEE (EF 15-20%, no LAA thrombus) and DCCV - amio increased to 200 mg bid.   Pneumonia    S/P mitral valve clip implantation 08/21/2023   Placement of 1 XTW in the A2/P2 position and 1 NTW in the A1/P1 position by Dr. Wendel and Dr. Wonda   Urinary hesitancy due to benign prostatic hypertrophy    Past Surgical History:  Procedure Laterality Date   ATRIAL FIBRILLATION ABLATION N/A 11/03/2019   Procedure: ATRIAL FIBRILLATION ABLATION;  Surgeon: Kelsie Agent, MD;  [facility]: [facility] INVASIVE CV LAB;  Service: Cardiovascular;  Laterality: N/A;   ATRIAL FIBRILLATION ABLATION N/A 02/13/2024   Procedure: ATRIAL FIBRILLATION ABLATION;  Surgeon: Inocencio Soyla Lunger, MD;   Location: [facility] INVASIVE CV [facility];  Service: Cardiovascular;  Laterality: N/A;   AV NODE ABLATION N/A 07/31/2024   Procedure: AV NODE ABLATION;  Surgeon: Inocencio Soyla Lunger, MD;  Location: [facility] CV [facility];  Service: Cardiovascular;  Laterality: N/A;   BACK SURGERY     upper back, cannot turn neck well   BIV ICD GENERATOR CHANGEOUT N/A 09/16/2023   Procedure: BIV ICD GENERATOR CHANGEOUT;  Surgeon: Inocencio Soyla Lunger, MD;  Location[facility] INVASIVE CV LAB;  Service: Cardiovascular;  Laterality: N/A;   CARDIAC CATHETERIZATION  1998   CARDIOVERSION N/A 01/26/2013   Procedure: CARDIOVERSION;  Surgeon: Redell GORMAN Shallow, MD;  Location: [facility] ENDOSCOPY;  Service: Cardiovascular;  Laterality: N/A;   CARDIOVERSION N/A 03/23/2015   Procedure: CARDIOVERSION;  Surgeon: Oneil JAYSON Parchment, MD;  Location: [facility] ENDOSCOPY;  Service: Cardiovascular;  Laterality: N/A;   CARDIOVERSION N/A 08/14/2017   Procedure: CARDIOVERSION;  Surgeon: Delford Maude JAYSON, MD;  Location: [facility] ENDOSCOPY;  Service: Cardiovascular;  Laterality: N/A;   CATARACT EXTRACTION  Right ~ 2006   COLONOSCOPY WITH PROPOFOL  N/A 02/10/2015   Procedure: COLONOSCOPY WITH PROPOFOL ;  Surgeon: Lupita FORBES Commander, MD;  Location: [facility] ENDOSCOPY;  Service: Endoscopy;  Laterality: N/A;   COLONOSCOPY WITH PROPOFOL  N/A 08/07/2016   Procedure: COLONOSCOPY WITH PROPOFOL ;  Surgeon: Lupita FORBES Commander, MD;  Location: [facility] ENDOSCOPY;  Service: Endoscopy;  Laterality: N/A;   ENTEROSCOPY N/A 08/17/2015   Procedure: ENTEROSCOPY;  Surgeon: Lupita FORBES Commander, MD;  Location: [facility] ENDOSCOPY;  Service: Endoscopy;  Laterality: N/A;   EP IMPLANTABLE DEVICE N/A 03/08/2015   MDT Fraser Hawthorne CRT-D for nonischemic CM by Dr Kelsie for primary prevention   GLAUCOMA SURGERY Right ~ 2006   put 3 stents in to drain fluid (03/08/2015) not successful, sent to [facility] to try to get last stent out   HOT HEMOSTASIS N/A 08/07/2016   Procedure: HOT HEMOSTASIS (ARGON PLASMA COAGULATION/BICAP);  Surgeon: Lupita FORBES Commander, MD;   Location: [facility];  Service: Endoscopy;  Laterality: N/A;   INCISION AND DRAINAGE ABSCESS POSTERIOR CERVICALSPINE  05/2012   JOINT REPLACEMENT     MELANOMA EXCISION Left 2015   lower leg; right at my knee   REFRACTIVE SURGERY Right ~ 2006 X 2   twice; both done at [facility] (03/08/2015   RIGHT HEART CATH N/A 08/05/2023   Procedure: RIGHT HEART CATH;  Surgeon: Rolan Ezra RAMAN, MD;  Location: [facility] INVASIVE CV LAB;  Service: Cardiovascular;  Laterality: N/A;   SURGERY SCROTAL / TESTICULAR Right 1990's   TEE WITHOUT CARDIOVERSION N/A 01/26/2013   Procedure: TRANSESOPHAGEAL ECHOCARDIOGRAM (TEE);  Surgeon: Redell RAMAN Shallow, MD;  Location: [facility] ENDOSCOPY;  Service: Cardiovascular;  Laterality: N/A;  [name] anes. /    TEE WITHOUT CARDIOVERSION N/A 10/05/2014   Procedure: TRANSESOPHAGEAL ECHOCARDIOGRAM (TEE)  with cardioversion;  Surgeon: Aleene JINNY Passe, MD;  Location: [facility] ENDOSCOPY;  Service: Cardiovascular;  Laterality: N/A;  12:52 synched cardioversion at 120 joules,...afib to SR...12 lead EKG ordered.SABRASABRACardiozem d/c'ed per MD verbal order at SR   TEE WITHOUT CARDIOVERSION N/A 03/23/2015   Procedure: TRANSESOPHAGEAL ECHOCARDIOGRAM (TEE);  Surgeon: Oneil JAYSON Parchment, MD;  Location: [facility] ENDOSCOPY;  Service: Cardiovascular;  Laterality: N/A;   TEE WITHOUT CARDIOVERSION N/A 11/02/2019   Procedure: TRANSESOPHAGEAL ECHOCARDIOGRAM (TEE);  Surgeon: Delford Maude JAYSON, MD;  Location: [facility] ENDOSCOPY;  Service: Cardiovascular;  Laterality: N/A;   TEE WITHOUT CARDIOVERSION N/A 08/05/2023   Procedure: TRANSESOPHAGEAL ECHOCARDIOGRAM;  Surgeon: Rolan Ezra RAMAN, MD;  Location: [facility] INVASIVE CV LAB;  Service: Cardiovascular;  Laterality: N/A;   THORACIC SPINE SURGERY  03/2000   ground calcium  deposits from upper thoracic (01/26/2013)   THYROIDECTOMY N/A 05/11/2021   Procedure: TOTAL THYROIDECTOMY;  Surgeon: Eletha Boas, MD;  Location: [facility] ORS;  Service: General;  Laterality: N/A;   TOTAL HIP ARTHROPLASTY Right 06/2007   TRANSCATHETER  MITRAL EDGE TO EDGE REPAIR N/A 08/21/2023   Procedure: TRANSCATHETER MITRAL EDGE TO EDGE REPAIR;  Surgeon: Thukkani, Arun K, MD;  Location: [facility] INVASIVE CV LAB;  Service: Cardiovascular;  Laterality: N/A;   TRANSESOPHAGEAL ECHOCARDIOGRAM (CATH LAB) N/A 08/09/2023   Procedure: TRANSESOPHAGEAL ECHOCARDIOGRAM;  Surgeon: Rolan Ezra RAMAN, MD;  Location: [facility] INVASIVE CV LAB;  Service: Cardiovascular;  Laterality: N/A;   TRANSESOPHAGEAL ECHOCARDIOGRAM (CATH LAB) N/A 08/21/2023   Procedure: TRANSESOPHAGEAL ECHOCARDIOGRAM;  Surgeon: Wendel Lurena POUR, MD;  Location: [facility] INVASIVE CV LAB;  Service: Cardiovascular;  Laterality: N/A;    SOCIAL HISTORY:  Social History   Socioeconomic History   Marital status: Married    Spouse name: Not on file  Number of children: 3   Years of education: Not on file   Highest education level: Not on file  Occupational History   Occupation: retired  Tobacco Use   Smoking status: Former    Current packs/day: 0.00    Average packs/day: 3.0 packs/day for 48.0 years (144.0 ttl pk-yrs)    Types: Cigarettes    Start date: 09/28/1952    Quit date: 09/28/2000    Years since quitting: 24.0   Smokeless tobacco: Never   Tobacco comments:    Former smoker 09/06/23  Vaping Use   Vaping status: Never Used  Substance and Sexual Activity   Alcohol  use: No   Drug use: No   Sexual activity: Not Currently  Other Topics Concern   Not on file  Social History Narrative   Pt lives in [place] with spouse.  3 children are grown and healthy.   Retired.  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   Social Drivers of Health   Tobacco Use: Medium Risk (09/10/2024)   Patient History    Smoking Tobacco Use: Former    Smokeless Tobacco Use: Never    Passive Exposure: Not on Actuary Strain: Not on file  Food Insecurity: No Food Insecurity (08/20/2023)   Hunger Vital Sign    Worried About Running Out of Food in  the Last Year: Never true    Ran Out of Food in the Last Year: Never true  Transportation Needs: No Transportation Needs (08/20/2023)   PRAPARE - Administrator, Civil Service (Medical): No    Lack of Transportation (Non-Medical): No  Physical Activity: Not on file  Stress: Not on file  Social Connections: Not on file  Intimate Partner Violence: Not At Risk (08/20/2023)   Humiliation, Afraid, Rape, and Kick questionnaire    Fear of Current or Ex-Partner: No    Emotionally Abused: No    Physically Abused: No    Sexually Abused: No  Depression (PHQ2-9): Low Risk (04/20/2024)   Depression (PHQ2-9)    PHQ-2 Score: 0  Alcohol  Screen: Not on file  Housing: Low Risk (08/20/2023)   Housing    Last Housing Risk Score: 0  Utilities: Not At Risk (08/20/2023)   AHC Utilities    Threatened with loss of utilities: No  Health Literacy: Not on file    FAMILY HISTORY:  Family History  Problem Relation Age of Onset   Kidney disease Mother    Heart disease Mother        MI, open heart   Diabetes Mother        dialysis   Leukemia Father    Colon cancer Paternal Uncle    Lung cancer Paternal Uncle        x 2   Prostate cancer Paternal Uncle    Diabetes Maternal Grandmother    Heart attack Maternal Uncle    Diabetes Maternal Aunt        x 3   Diabetes Maternal Uncle    Thyroid  disease Sister     CURRENT MEDICATIONS:  Outpatient Encounter Medications as of 10/21/2024  Medication Sig   acetaminophen  (TYLENOL ) 500 MG tablet Take 1,000 mg by mouth 2 (two) times daily as needed (pain.).   amiodarone  (PACERONE ) 200 MG tablet Take 1 tablet (200 mg total) by mouth daily. (Patient taking differently: Take 200 mg by mouth at bedtime.)   Ascorbic Acid (VITAMIN C GUMMIE PO) Take 1  tablet by mouth in the morning.   calcium  carbonate (TUMS EX) 750 MG chewable tablet Chew 2 tablets by mouth daily in the afternoon.   cholecalciferol (VITAMIN D3) 25 MCG (1000 UNIT) tablet Take 1,000 Units  by mouth in the morning.   dorzolamide -timolol  (COSOPT ) 22.3-6.8 MG/ML ophthalmic solution Place 1 drop into the right eye 2 (two) times daily.   escitalopram  (LEXAPRO ) 20 MG tablet Take 1 tablet (20 mg total) by mouth daily. (Patient taking differently: Take 20 mg by mouth daily in the afternoon.)   Evolocumab  (REPATHA  SURECLICK) 140 MG/ML SOAJ INJECT 140MG ( ) INTO SKIN EVERY 2 WEEKS   Ferrous Sulfate  (IRON  PO) Take 9 mg by mouth in the morning. Gummie   finasteride  (PROSCAR ) 5 MG tablet Take 1 tablet (5 mg total) by mouth daily. (Patient taking differently: Take 5 mg by mouth at bedtime.)   FOLIC ACID  PO Take 400 mcg by mouth in the morning.   gabapentin  (NEURONTIN ) 100 MG capsule Take 200 mg by mouth 2 (two) times daily.    JARDIANCE  10 MG TABS tablet TAKE ONE TABLET DAILY   latanoprost  (XALATAN ) 0.005 % ophthalmic solution Place 1 drop into the right eye at bedtime.   levothyroxine  (SYNTHROID ) 137 MCG tablet TAKE ONE TABLET DAILY BEFORE BREAKFAST   metoprolol  succinate (TOPROL  XL) 25 MG 24 hr tablet Take 0.5 tablets (12.5 mg total) by mouth daily. (Patient taking differently: Take 12.5 mg by mouth every evening.)   Polyethyl Glycol-Propyl Glycol 0.4-0.3 % SOLN Apply 1 drop to eye 3 (three) times daily as needed (dry/irritated eyes.). Systane   potassium chloride  SA (KLOR-CON  M) 20 MEQ tablet Take 1 tablet (20 mEq total) by mouth daily.   RHOPRESSA  0.02 % SOLN Place 1 drop into the right eye at bedtime.   tamsulosin  (FLOMAX ) 0.4 MG CAPS Take 0.4 mg by mouth at bedtime.    tiZANidine (ZANAFLEX) 4 MG tablet Take 4 mg by mouth every 8 (eight) hours as needed for muscle spasms.   torsemide  (DEMADEX ) 20 MG tablet Take 3 tablets (60 mg total) by mouth in the morning AND 2 tablets (40 mg total) every evening.   XARELTO  15 MG TABS tablet TAKE ONE TABLET DAILY WITH SUPPER   No facility-administered encounter medications on file as of 10/21/2024.    ALLERGIES:  Allergies[1]  LABORATORY DATA:  I  have reviewed the labs as listed.  CBC    Component Value Date/Time   WBC 6.1 10/14/2024 0931   RBC 4.18 (L) 10/14/2024 0931   HGB 13.4 10/14/2024 0931   HGB 12.3 (L) 01/20/2024 1013   HCT 42.4 10/14/2024 0931   HCT 37.7 01/20/2024 1013   PLT 183 10/14/2024 0931   PLT 135 (L) 01/20/2024 1013   MCV 101.4 (H) 10/14/2024 0931   MCV 101 (H) 01/20/2024 1013   MCH 32.1 10/14/2024 0931   MCHC 31.6 10/14/2024 0931   RDW 15.6 (H) 10/14/2024 0931   RDW 14.3 01/20/2024 1013   LYMPHSABS 0.9 10/14/2024 0931   MONOABS 0.5 10/14/2024 0931   EOSABS 0.1 10/14/2024 0931   BASOSABS 0.1 10/14/2024 0931      Latest Ref Rng & Units 10/14/2024    9:31 AM 07/29/2024   10:31 AM 07/16/2024   10:24 AM  CMP  Glucose 70 - 99 mg/dL 888  886  887   BUN 8 - 23 mg/dL 33  42  38   Creatinine 0.61 - 1.24 mg/dL 7.89  7.12  7.35   Sodium 135 - 145 mmol/L  146  145  142   Potassium 3.5 - 5.1 mmol/L 3.9  4.2  5.4   Chloride 98 - 111 mmol/L 104  104  104   CO2 22 - 32 mmol/L 28  31  30    Calcium  8.9 - 10.3 mg/dL 8.5  8.2  8.9   Total Protein 6.5 - 8.1 g/dL 7.2     Total Bilirubin 0.0 - 1.2 mg/dL 0.5     Alkaline Phos 38 - 126 U/L 131     AST 15 - 41 U/L 17     ALT 0 - 44 U/L 21       DIAGNOSTIC IMAGING:  I have independently reviewed the relevant imaging and discussed with the patient.   WRAP UP:  All questions were answered. The patient knows to call the clinic with any problems, questions or concerns.  Medical decision making: ***  Time spent on visit: I spent *** minutes counseling the patient face to face. The total time spent in the appointment was *** minutes and more than 50% was on counseling.  Pleasant CHRISTELLA Barefoot, PA-C  ***    [1]  Allergies Allergen Reactions   Pravastatin  Sodium Other (See Comments)    Joint and muscle pain   Azithromycin  Diarrhea   "

## 2024-10-21 ENCOUNTER — Inpatient Hospital Stay: Admitting: Physician Assistant

## 2024-10-21 VITALS — BP 117/77 | HR 81 | Temp 98.0°F | Resp 18 | Wt 202.0 lb

## 2024-10-21 DIAGNOSIS — D631 Anemia in chronic kidney disease: Secondary | ICD-10-CM

## 2024-10-21 DIAGNOSIS — N1832 Chronic kidney disease, stage 3b: Secondary | ICD-10-CM

## 2024-10-21 DIAGNOSIS — D649 Anemia, unspecified: Secondary | ICD-10-CM | POA: Diagnosis not present

## 2024-10-21 DIAGNOSIS — D696 Thrombocytopenia, unspecified: Secondary | ICD-10-CM

## 2024-10-21 DIAGNOSIS — E538 Deficiency of other specified B group vitamins: Secondary | ICD-10-CM

## 2024-10-21 NOTE — Patient Instructions (Addendum)
 [facility] at [facility] Discharge Instructions  You were seen today by [name] PA-C for your anemia and low platelets.    Your blood counts look great, but you have previously had some mild anemia related to underlying chronic kidney disease.  We will continue to monitor your blood counts but you do not need treatment for your anemia at this time.  Your platelets are normal (> 150), but have previously been mildly low. You are not at risk of severe bleeding at this level.  You do not need any treatment for your low platelets at this time, but we will continue to monitor.  The most recent CT scan of your chest (December 2024) showed stable lung nodules, which were felt to be benign; no other CTs tests are needed at this time.  MEDICATIONS:  CONTINUE folic acid  400 mcg daily DECREASE iron  tablet to take every other day (instead of daily) DECREASE vitamin B12 to take 1000 mcg every other day (instead of daily)  FOLLOW-UP APPOINTMENT: Labs and office visit in 6 months   ** Thank you for trusting me with your healthcare!  I strive to provide all of my patients with quality care at each visit.  If you receive a survey for this visit, I would be so grateful to you for taking the time to provide feedback.  Thank you in advance!  ~ Sierah Lacewell                                        Dr. Mickiel Davonna [name], PA-C          Delon Hope, NP   - - - - - - - - - - - - - - - - - -      Thank you for choosing [facility] at [facility] to provide your oncology and hematology care.  To afford each patient quality time with our provider, please arrive at least 15 minutes before your scheduled appointment time.   If you have a lab appointment with the Cancer Center please come in thru the Main Entrance and check in at the main information desk.  You need to re-schedule your appointment should you arrive 10 or more minutes late.   We strive to give you quality time with our providers, and arriving late affects you and other patients whose appointments are after yours.  Also, if you no show three or more times for appointments you may be dismissed from the clinic at the providers discretion.     Again, thank you for choosing [facility].  Our hope is that these requests will decrease the amount of time that you wait before being seen by our physicians.       _____________________________________________________________  Should you have questions after your visit to [facility], please contact our office at [phone] and follow the prompts.  Our office hours are 8:00 a.m. and 4:30 p.m. Monday - Friday.  Please note that voicemails left after 4:00 p.m. may not be returned until the following business day.  We are closed weekends and major holidays.  You do have access to a nurse 24-7, just call the main number to the clinic [phone] and do not press any options, hold on the line and a nurse will answer the  phone.    For prescription refill requests, have your pharmacy contact our office and allow 72 hours.    Due to Covid, you will need to wear a mask upon entering the hospital. If you do not have a mask, a mask will be given to you at the Main Entrance upon arrival. For doctor visits, patients may have 1 support person age 71 or older with them. For treatment visits, patients can not have anyone with them due to social distancing guidelines and our immunocompromised population.

## 2024-10-29 ENCOUNTER — Other Ambulatory Visit (HOSPITAL_COMMUNITY): Payer: Self-pay | Admitting: Cardiology

## 2024-10-29 NOTE — Progress Notes (Signed)
 31 day ICM Remote transmission canceled due to [facility] clinic is on hold until further notice.  91 day remote monitoring will continue per protocol.

## 2024-11-02 ENCOUNTER — Ambulatory Visit

## 2024-11-03 ENCOUNTER — Telehealth: Payer: Self-pay | Admitting: "Endocrinology

## 2024-11-03 NOTE — Telephone Encounter (Signed)
Update labs please

## 2024-11-04 ENCOUNTER — Other Ambulatory Visit: Payer: Self-pay | Admitting: *Deleted

## 2024-11-04 DIAGNOSIS — C73 Malignant neoplasm of thyroid gland: Secondary | ICD-10-CM

## 2024-11-04 DIAGNOSIS — E890 Postprocedural hypothyroidism: Secondary | ICD-10-CM

## 2024-11-04 NOTE — Telephone Encounter (Signed)
 Labs updated

## 2024-11-11 ENCOUNTER — Ambulatory Visit: Admitting: "Endocrinology

## 2024-12-03 ENCOUNTER — Ambulatory Visit: Admitting: "Endocrinology

## 2024-12-25 ENCOUNTER — Encounter

## 2024-12-28 ENCOUNTER — Encounter

## 2025-03-26 ENCOUNTER — Encounter

## 2025-03-29 ENCOUNTER — Encounter

## 2025-04-20 ENCOUNTER — Inpatient Hospital Stay

## 2025-04-27 ENCOUNTER — Inpatient Hospital Stay: Admitting: Physician Assistant

## 2025-06-25 ENCOUNTER — Encounter

## 2025-06-28 ENCOUNTER — Encounter

## 2025-09-27 ENCOUNTER — Encounter
